# Patient Record
Sex: Male | Born: 1945 | Race: White | Hispanic: No | Marital: Married | Smoking: Former smoker
Health system: Southern US, Community
[De-identification: ages and names within clinical notes are randomized; demographics above are authoritative.]

## PROBLEM LIST (undated history)

## (undated) DIAGNOSIS — C801 Malignant (primary) neoplasm, unspecified: Secondary | ICD-10-CM

## (undated) DIAGNOSIS — Z87898 Personal history of other specified conditions: Secondary | ICD-10-CM

## (undated) DIAGNOSIS — N261 Atrophy of kidney (terminal): Secondary | ICD-10-CM

## (undated) DIAGNOSIS — N201 Calculus of ureter: Secondary | ICD-10-CM

## (undated) DIAGNOSIS — Z8679 Personal history of other diseases of the circulatory system: Secondary | ICD-10-CM

## (undated) DIAGNOSIS — Z85820 Personal history of malignant melanoma of skin: Secondary | ICD-10-CM

## (undated) DIAGNOSIS — I10 Essential (primary) hypertension: Secondary | ICD-10-CM

## (undated) DIAGNOSIS — Z973 Presence of spectacles and contact lenses: Secondary | ICD-10-CM

## (undated) DIAGNOSIS — R011 Cardiac murmur, unspecified: Secondary | ICD-10-CM

## (undated) DIAGNOSIS — N183 Chronic kidney disease, stage 3 unspecified: Secondary | ICD-10-CM

## (undated) DIAGNOSIS — M1990 Unspecified osteoarthritis, unspecified site: Secondary | ICD-10-CM

## (undated) DIAGNOSIS — Z87442 Personal history of urinary calculi: Secondary | ICD-10-CM

## (undated) DIAGNOSIS — Z972 Presence of dental prosthetic device (complete) (partial): Secondary | ICD-10-CM

## (undated) DIAGNOSIS — J302 Other seasonal allergic rhinitis: Secondary | ICD-10-CM

## (undated) DIAGNOSIS — N289 Disorder of kidney and ureter, unspecified: Secondary | ICD-10-CM

## (undated) DIAGNOSIS — K219 Gastro-esophageal reflux disease without esophagitis: Secondary | ICD-10-CM

## (undated) HISTORY — PX: UMBILICAL HERNIA REPAIR: SUR1181

## (undated) HISTORY — PX: OTHER SURGICAL HISTORY: SHX169

## (undated) HISTORY — PX: COLONOSCOPY: SHX174

## (undated) HISTORY — PX: MOHS SURGERY: SHX181

## (undated) MED FILL — Decitabine For Inj 50 MG: INTRAVENOUS | Qty: 7 | Status: AC

---

## 1976-05-08 HISTORY — PX: ORIF ANKLE FRACTURE: SHX5408

## 1984-05-08 HISTORY — PX: TONSILLECTOMY AND ADENOIDECTOMY: SUR1326

## 2005-05-08 HISTORY — PX: ANKLE FUSION: SHX881

## 2009-12-28 ENCOUNTER — Ambulatory Visit (HOSPITAL_BASED_OUTPATIENT_CLINIC_OR_DEPARTMENT_OTHER): Admission: RE | Admit: 2009-12-28 | Discharge: 2009-12-28 | Payer: Self-pay | Admitting: Orthopedic Surgery

## 2009-12-28 HISTORY — PX: CARPAL TUNNEL RELEASE: SHX101

## 2010-03-03 ENCOUNTER — Ambulatory Visit (HOSPITAL_COMMUNITY): Admission: RE | Admit: 2010-03-03 | Discharge: 2010-03-03 | Payer: Self-pay | Admitting: Urology

## 2010-03-07 ENCOUNTER — Ambulatory Visit (HOSPITAL_COMMUNITY): Admission: RE | Admit: 2010-03-07 | Discharge: 2010-03-07 | Payer: Self-pay | Admitting: Urology

## 2010-03-23 ENCOUNTER — Encounter: Admission: RE | Admit: 2010-03-23 | Discharge: 2010-03-23 | Payer: Self-pay | Admitting: Neurosurgery

## 2010-04-06 ENCOUNTER — Encounter: Admission: RE | Admit: 2010-04-06 | Discharge: 2010-04-06 | Payer: Self-pay | Admitting: Neurosurgery

## 2010-05-23 ENCOUNTER — Ambulatory Visit
Admission: RE | Admit: 2010-05-23 | Discharge: 2010-05-23 | Payer: Self-pay | Source: Home / Self Care | Attending: Urology | Admitting: Urology

## 2010-05-23 HISTORY — PX: OTHER SURGICAL HISTORY: SHX169

## 2010-05-25 LAB — POCT I-STAT 4, (NA,K, GLUC, HGB,HCT)
Glucose, Bld: 97 mg/dL (ref 70–99)
HCT: 37 % — ABNORMAL LOW (ref 39.0–52.0)
Hemoglobin: 12.6 g/dL — ABNORMAL LOW (ref 13.0–17.0)
Potassium: 4.5 mEq/L (ref 3.5–5.1)
Sodium: 139 mEq/L (ref 135–145)

## 2010-06-08 NOTE — Op Note (Signed)
NAME:  Richard Wallace, Richard Wallace NO.:  [id number]  MEDICAL RECORD NO.:  [id number]          PATIENT TYPE:  AMB  LOCATION:  [facility]                         FACILITY:  [facility]  PHYSICIAN:  Bertram Millard. Dawnyel Leven, M.D.DATE OF BIRTH:  1945/07/04  DATE OF PROCEDURE:  05/23/2010 DATE OF DISCHARGE:                              OPERATIVE REPORT   PREOPERATIVE DIAGNOSIS:  A 10-mm proximal left ureteral stone with significant hydronephrosis.  POSTOPERATIVE DIAGNOSES:  A 10-mm proximal left ureteral stone with significant hydronephrosis.  PRINCIPLE PROCEDURES:  Cystoscopy, left retrograde ureteral pyelogram, left ureteroscopy with holmium laser and extraction of ureteral stone, double-J stent placement (6-French x 24 cm contour).  SURGEON:  Bertram Millard. Sheneika Walstad, M.D.  ANESTHESIA:  General with LMA.  COMPLICATIONS:  None.  SPECIMENS:  Stones left in the bladder.  BRIEF HISTORY:  This 65 year old male recently presented to me for consideration of laparoscopic left nephrectomy.  The patient has a fairly nonfunctioning kidney with a 10-mm stone on that side.  He has been asymptomatic with the stone, however.  I saw him in consultation, and in reviewing the patient's data, felt it might be worthwhile just treating the stone and leaving what remaining kidney with function to be determined later instead of removing the patient's kidney.  Certainly, this was the less risky procedure to remove the stone then to remove the kidney.  Risks and complications of the ureteroscopy with stone extraction were discussed with the patient and his wife.  They understand these and desire to proceed.  DESCRIPTION OF PROCEDURE:  The patient was identified in the holding area, the surgical side was marked, and he received preoperative IV antibiotics.  He was taken to the operating room where general anesthetic was administered using LMA.  He was placed in the dorsal lithotomy position.  Genitalia and  perineum were prepped and draped. Time-out was then called.  The procedure commenced.  I dilated the patient's urethra to 24-French with Sissy Hoff sounds, and then passed a 22-French panendoscope directly through the urethra.  It was normal without evidence of prostatic obstruction or lesions.  The bladder was inspected.  The urine was drained and then the irrigation fluid instilled.  No tumors, trabeculations or foreign bodies were seen.  Both ureteral orifices were normal in configuration and location.  I used a 6-French end-hole open- end catheter to perform a retrograde.  This showed a normal distal ureter.  There was a filling defect in the proximal ureter with very little egress of contrast beyond.  There were no filling defects otherwise in the ureter besides this one stone.  I then, with a bit of difficulty, ended up passing an angle tipped Glidewire past the stone, utilizing the open-ended ureteral catheter to help me guide this by. Following this, I removed the open-end catheter and dilated the distal ureter and the ureteral orifice with the inner core of a ureteral access sheath.  Following this, I then placed a ureteroscope up to the stone. The stone was identified and pictures taken.  I then used a 200 micron fiber to fragment the stone into multiple small fragments.  The  stone fragmented fairly well.  I then grasped most of the fragments, all that I could see, and extracted them into the bladder using the nitinol basket.  Multiple fragments were rinsed into the bladder.  I felt that the patient could void these out at a later time.  After extracting perhaps 20 fragments, I passed the scope up to where the stone was.  No further stones were seen.  Some might have migrated into the renal pelvis, but I could not see any to extract.  At this point, I removed the ureteroscope and, over the top of the guidewire, placed a 6-French x 24 cm contour catheter.  The string was left on  the end.  Good proximal distal curls were seen after extraction of the wire.  The bladder was drained.  The scope was removed.  The patient received a B and O suppository.  The patient's string was taped to his penis.  The patient will follow up with me in approximately 2 weeks.  It was recommended that he remove the stent in three more days by pulling the string.  He was discharged home with hydrocodone, Keflex for 5 days and Uribel for dysuria.     Bertram Millard. Retta Diones, M.D.     SMD/MEDQ  D:  05/23/2010  T:  05/23/2010  Job:  [id number]  Electronically Signed by Marcine Matar M.D. on 06/08/2010 06:02:55 PM

## 2010-07-22 LAB — BASIC METABOLIC PANEL
BUN: 24 mg/dL — ABNORMAL HIGH (ref 6–23)
CO2: 27 mEq/L (ref 19–32)
Calcium: 9.4 mg/dL (ref 8.4–10.5)
Chloride: 106 mEq/L (ref 96–112)
Creatinine, Ser: 1.08 mg/dL (ref 0.4–1.5)
GFR calc Af Amer: >60 mL/min (ref >60)
GFR calc non Af Amer: >60 mL/min (ref >60)
Glucose, Bld: 109 mg/dL — ABNORMAL HIGH (ref 70–99)
Potassium: 5 mEq/L (ref 3.5–5.1)
Sodium: 138 mEq/L (ref 135–145)

## 2010-07-22 LAB — POCT HEMOGLOBIN-HEMACUE: Hemoglobin: 15.2 g/dL (ref 13.0–17.0)

## 2010-08-31 ENCOUNTER — Other Ambulatory Visit: Payer: Self-pay | Admitting: Urology

## 2010-08-31 DIAGNOSIS — N1330 Unspecified hydronephrosis: Secondary | ICD-10-CM

## 2010-10-07 ENCOUNTER — Encounter (HOSPITAL_COMMUNITY)
Admission: RE | Admit: 2010-10-07 | Discharge: 2010-10-07 | Disposition: A | Discharge status: Home / Self Care | Payer: Medicare Other | Source: Ambulatory Visit | Attending: Urology | Admitting: Urology

## 2010-10-07 DIAGNOSIS — N1330 Unspecified hydronephrosis: Secondary | ICD-10-CM | POA: Insufficient documentation

## 2010-10-07 DIAGNOSIS — N289 Disorder of kidney and ureter, unspecified: Secondary | ICD-10-CM | POA: Insufficient documentation

## 2010-10-07 MED ORDER — TECHNETIUM TC 99M MERTIATIDE
16.0000 | Freq: Once | INTRAVENOUS | Status: AC | PRN
  Administered 2010-10-07: 16 via INTRAVENOUS

## 2010-10-07 MED ORDER — FUROSEMIDE 10 MG/ML IJ SOLN: 47.0000 mg | Freq: Once | INTRAMUSCULAR | Status: DC

## 2010-11-21 ENCOUNTER — Ambulatory Visit (HOSPITAL_BASED_OUTPATIENT_CLINIC_OR_DEPARTMENT_OTHER)
Admission: RE | Admit: 2010-11-21 | Discharge: 2010-11-21 | Disposition: A | Discharge status: Home / Self Care | Payer: Medicare Other | Source: Ambulatory Visit | Attending: Urology | Admitting: Urology

## 2010-11-21 DIAGNOSIS — Z01812 Encounter for preprocedural laboratory examination: Secondary | ICD-10-CM | POA: Insufficient documentation

## 2010-11-21 DIAGNOSIS — N1330 Unspecified hydronephrosis: Secondary | ICD-10-CM | POA: Insufficient documentation

## 2010-11-21 DIAGNOSIS — N3289 Other specified disorders of bladder: Secondary | ICD-10-CM | POA: Insufficient documentation

## 2010-11-21 HISTORY — PX: OTHER SURGICAL HISTORY: SHX169

## 2010-11-21 LAB — POCT I-STAT 4, (NA,K, GLUC, HGB,HCT)
Glucose, Bld: 95 mg/dL (ref 70–99)
HCT: 46 % (ref 39.0–52.0)
Hemoglobin: 15.6 g/dL (ref 13.0–17.0)
Potassium: 4.6 mEq/L (ref 3.5–5.1)
Sodium: 139 mEq/L (ref 135–145)

## 2010-12-06 NOTE — Op Note (Signed)
NAME:  Richard Wallace, Richard Wallace NO.:  [id number]  MEDICAL RECORD NO.:  [id number]  LOCATION:                                 FACILITY:  PHYSICIAN:  Bertram Millard. Bentlie Catanzaro, M.D.DATE OF BIRTH:  1945-06-13  DATE OF PROCEDURE:  11/21/2010 DATE OF DISCHARGE:                              OPERATIVE REPORT   PREOPERATIVE DIAGNOSIS:  Left hydronephrosis with poor bladder emptying, rule out ureteral stricture.  POSTOPERATIVE DIAGNOSIS:  Left hydronephrosis, no evidence of ureteral stricture.  PRINCIPAL PROCEDURES:  Cystoscopy, left retrograde ureteral pyelogram.  SURGEON:  Bertram Millard. Tom Macpherson, MD  ANESTHESIA:  General with LMA.  COMPLICATIONS:  None.  BRIEF HISTORY:  This 65 year old gentleman underwent ureteroscopic stone treatment with holmium laser and extraction in April of this year.  He had, more than likely, a longstanding left ureteral stone which was minimally symptomatic, but imaging revealed significant hydronephrosis. He was referred to me by Dr. Brunilda Payor for possible laparoscopic nephrectomy. I thought it worthwhile to just treat the stone, which was done in April.  Recent Lasix renogram revealed poor flow into the kidney and very poor emptying out of that left kidney with splits of 93% function on the right, 7% on the left.  It was recommended that we perform cystoscopy, retrograde, possible endopyelotomy of the ureteral stricture versus ureteroscopic management of any residual stone.  He presents at this time for that procedure.  Risks and complications have been discussed with him.  He understands these and desires to proceed.  DESCRIPTION OF PROCEDURE:  The patient was identified in the holding area.  Surgical site was marked and he received preoperative IV antibiotics.  He was taken to the operating room where general anesthetic was administered using the LMA.  He was placed in the dorsal lithotomy position.  Genitalia and perineum were prepped and  draped. Time-out was then performed.  The procedure then commenced.  A 22-French panendoscope was advanced under direct vision through his urethra.  Urethra was normal, prostate was nonobstructive.  The bladder was entered.  Circumferential examination of the bladder revealed minimal trabeculations, normal ureteral orifices bilaterally.  No tumors or foreign bodies were noted. The left ureteral orifice was cannulated with a 6-French open-ended catheter.  Retrograde was then performed.  The retrograde revealed a totally normal ureter.  There was no evidence of any obstruction along the length of the ureter.  The renal pelvis was fairly small.  There was no evidence of proximal dilatation.  The calyces were somewhat widened but there was no evidence of significant hydronephrosis.  Spot films were then taken.  Adequate emptying was seen with continued fluoroscopy.  At this point, I did not feel that any ureteroscopy or endopyelotomy was needed, as there was no evidence of residual scar within the ureter. The bladder was drained and then scope removed.  The patient tolerated the procedure well.  He was awakened, extubated, and taken to PACU in stable condition.  I will send him home on 3 days of Cipro 250 mg 1 p.o. b.i.d..  This was sent to his pharmacy.  He has an appointment with me on December 16, 2010. However, if he is asymptomatic, he will  call to cancel that and they will return on a p.r.n. basis.     Bertram Millard. Hussain Maimone, M.D.     SMD/MEDQ  D:  11/21/2010  T:  11/21/2010  Job:  [id number]  cc:   Dr. Donney Rankins, [place]  Electronically Signed by Marcine Matar M.D. on 12/06/2010 04:57:57 PM

## 2012-11-19 ENCOUNTER — Ambulatory Visit (INDEPENDENT_AMBULATORY_CARE_PROVIDER_SITE_OTHER): Payer: Medicare Other | Admitting: Urology

## 2012-11-19 ENCOUNTER — Ambulatory Visit (HOSPITAL_COMMUNITY)
Admission: RE | Admit: 2012-11-19 | Discharge: 2012-11-19 | Disposition: A | Discharge status: Home / Self Care | Payer: Medicare Other | Source: Ambulatory Visit | Attending: Urology | Admitting: Urology

## 2012-11-19 ENCOUNTER — Other Ambulatory Visit: Payer: Self-pay | Admitting: Urology

## 2012-11-19 DIAGNOSIS — N200 Calculus of kidney: Secondary | ICD-10-CM

## 2013-07-16 ENCOUNTER — Other Ambulatory Visit: Payer: Self-pay | Admitting: Orthopedic Surgery

## 2013-07-16 ENCOUNTER — Encounter (HOSPITAL_BASED_OUTPATIENT_CLINIC_OR_DEPARTMENT_OTHER): Payer: Self-pay | Admitting: *Deleted

## 2013-07-16 NOTE — Progress Notes (Signed)
Called for ekg and ov notes=will need istat-was here 2011 for lt ctr

## 2013-07-21 NOTE — H&P (Signed)
  Richard Wallace is an 68 y.o. male.   Chief Complaint: c/o chronic and progressive numbness and tingling of the right hand HPI: Richard Wallace is a well known former patient who returns for evaluation of right carpal tunnel symptoms.  In 2011 we performed bilateral electrodiagnostic studies which documented left carpal tunnel syndrome.  Richard Wallace had surgery to release his left transverse carpal ligament and release of his left long finger A-1 pulley.  He now has some nodule formation in his right long finger superficialis tendon without active triggering and persistent right hand numbness.  Past Medical History  Diagnosis Date  . Hypertension   . GERD (gastroesophageal reflux disease)   . Arthritis   . 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 Seasonal allergies   . Wears glasses     Past Surgical History  Procedure Laterality Date  . Carpal tunnel release  2011    left with lt long tf  . Cystoscopy w/ ureteral stent placement  2012    left  . Cystoscopy w/ stone manipulation  2012  . Orif ankle fracture  1978    right  . Umbilical hernia repair    . Colonoscopy      History reviewed. No pertinent family history. Social History:  reports that he quit smoking about 27 years ago. He does not have any smokeless tobacco history on file. He reports that he drinks alcohol. He reports that he does not use illicit drugs.  Allergies: No Known Allergies  No prescriptions prior to admission    No results found for this or any previous visit (from the past 48 hour(s)).  No results found.   Pertinent items are noted in HPI.  Height 5\' 10"  (1.778 m), weight 95.255 kg (210 lb).  General appearance: alert Head: Normocephalic, without obvious abnormality Neck: supple, symmetrical, trachea midline Resp: clear to auscultation bilaterally Cardio: regular rate and rhythm GI: normal findings: bowel sounds normal Extremities: Dr. Zebedee Iba repeated electrodiagnostic studies of the  right hand and arm only.  Richard Wallace has persistent significant right carpal tunnel syndrome. There were no abnormal parameters for ulnar nerve conduction. On exam the patient has a positive Phalen's and Tinel's. He has FROM of his digits and wrist. Normal sweat pattern. Pulses: 2+ and symmetric Skin: normal Neurologic: Grossly normal    Assessment/Plan Impression:  Right CTS  Plan: TO the OR for right CTR.The procedure, risks,benefits and post-op course were discussed with the patient at length and they were in agreement with the plan.  DASNOIT,Iveth Heidemann J 07/21/2013, 4:25 PM  H&P documentation: 07/22/2013  -History and Physical Reviewed  -Patient has been re-examined  -No change in the plan of care  Cammie Sickle, MD

## 2013-07-22 ENCOUNTER — Ambulatory Visit (HOSPITAL_BASED_OUTPATIENT_CLINIC_OR_DEPARTMENT_OTHER)
Admission: RE | Admit: 2013-07-22 | Discharge: 2013-07-22 | Disposition: A | Discharge status: Home / Self Care | Payer: Medicare Other | Source: Ambulatory Visit | Attending: Orthopedic Surgery | Admitting: Orthopedic Surgery

## 2013-07-22 ENCOUNTER — Encounter (HOSPITAL_BASED_OUTPATIENT_CLINIC_OR_DEPARTMENT_OTHER): Payer: Medicare Other | Admitting: Anesthesiology

## 2013-07-22 ENCOUNTER — Encounter (HOSPITAL_BASED_OUTPATIENT_CLINIC_OR_DEPARTMENT_OTHER): Payer: Self-pay | Admitting: *Deleted

## 2013-07-22 ENCOUNTER — Encounter (HOSPITAL_BASED_OUTPATIENT_CLINIC_OR_DEPARTMENT_OTHER)
Admission: RE | Disposition: A | Discharge status: Home / Self Care | Payer: Self-pay | Source: Ambulatory Visit | Attending: Orthopedic Surgery

## 2013-07-22 ENCOUNTER — Ambulatory Visit (HOSPITAL_BASED_OUTPATIENT_CLINIC_OR_DEPARTMENT_OTHER): Payer: Medicare Other | Admitting: Anesthesiology

## 2013-07-22 DIAGNOSIS — M129 Arthropathy, unspecified: Secondary | ICD-10-CM | POA: Insufficient documentation

## 2013-07-22 DIAGNOSIS — Z87891 Personal history of nicotine dependence: Secondary | ICD-10-CM | POA: Insufficient documentation

## 2013-07-22 DIAGNOSIS — G5600 Carpal tunnel syndrome, unspecified upper limb: Secondary | ICD-10-CM | POA: Insufficient documentation

## 2013-07-22 DIAGNOSIS — N289 Disorder of kidney and ureter, unspecified: Secondary | ICD-10-CM | POA: Insufficient documentation

## 2013-07-22 DIAGNOSIS — K219 Gastro-esophageal reflux disease without esophagitis: Secondary | ICD-10-CM | POA: Insufficient documentation

## 2013-07-22 DIAGNOSIS — I10 Essential (primary) hypertension: Secondary | ICD-10-CM | POA: Insufficient documentation

## 2013-07-22 HISTORY — DX: Disorder of kidney and ureter, unspecified: N28.9

## 2013-07-22 HISTORY — DX: Presence of spectacles and contact lenses: Z97.3

## 2013-07-22 HISTORY — DX: Other seasonal allergic rhinitis: J30.2

## 2013-07-22 HISTORY — PX: CARPAL TUNNEL RELEASE: SHX101

## 2013-07-22 HISTORY — DX: Unspecified osteoarthritis, unspecified site: M19.90

## 2013-07-22 HISTORY — DX: Gastro-esophageal reflux disease without esophagitis: K21.9

## 2013-07-22 HISTORY — DX: Essential (primary) hypertension: I10

## 2013-07-22 LAB — POCT I-STAT, CHEM 8
BUN: 36 mg/dL — ABNORMAL HIGH (ref 6–23)
Calcium, Ion: 1.32 mmol/L — ABNORMAL HIGH (ref 1.13–1.30)
Chloride: 105 mEq/L (ref 96–112)
Creatinine, Ser: 1.3 mg/dL (ref 0.50–1.35)
Glucose, Bld: 101 mg/dL — ABNORMAL HIGH (ref 70–99)
HCT: 46 % (ref 39.0–52.0)
Hemoglobin: 15.6 g/dL (ref 13.0–17.0)
Potassium: 4.8 mEq/L (ref 3.7–5.3)
Sodium: 144 mEq/L (ref 137–147)
TCO2: 27 mmol/L (ref 0–100)

## 2013-07-22 SURGERY — CARPAL TUNNEL RELEASE
Anesthesia: General | Site: Wrist | Laterality: Right

## 2013-07-22 MED ORDER — HYDROCODONE-ACETAMINOPHEN 5-325 MG PO TABS: 1.0000 | ORAL_TABLET | Freq: Four times a day (QID) | ORAL | Status: DC | PRN

## 2013-07-22 MED ORDER — HYDROMORPHONE HCL PF 1 MG/ML IJ SOLN: 0.2500 mg | INTRAMUSCULAR | Status: DC | PRN

## 2013-07-22 MED ORDER — OXYCODONE HCL 5 MG PO TABS: 5.0000 mg | ORAL_TABLET | Freq: Once | ORAL | Status: DC | PRN

## 2013-07-22 MED ORDER — MIDAZOLAM HCL 2 MG/2ML IJ SOLN: 1.0000 mg | INTRAMUSCULAR | Status: DC | PRN

## 2013-07-22 MED ORDER — FENTANYL CITRATE 0.05 MG/ML IJ SOLN: 50.0000 ug | INTRAMUSCULAR | Status: DC | PRN

## 2013-07-22 MED ORDER — LACTATED RINGERS IV SOLN
INTRAVENOUS | Status: DC
  Administered 2013-07-22 (×2): via INTRAVENOUS

## 2013-07-22 MED ORDER — CHLORHEXIDINE GLUCONATE 4 % EX LIQD: 60.0000 mL | Freq: Once | CUTANEOUS | Status: DC

## 2013-07-22 MED ORDER — LIDOCAINE HCL (CARDIAC) 20 MG/ML IV SOLN
INTRAVENOUS | Status: DC | PRN
  Administered 2013-07-22: 50 mg via INTRAVENOUS

## 2013-07-22 MED ORDER — PROPOFOL 10 MG/ML IV EMUL
INTRAVENOUS | Status: AC
  Filled 2013-07-22: qty 50

## 2013-07-22 MED ORDER — FENTANYL CITRATE 0.05 MG/ML IJ SOLN
INTRAMUSCULAR | Status: DC | PRN
  Administered 2013-07-22 (×2): 50 ug via INTRAVENOUS

## 2013-07-22 MED ORDER — ONDANSETRON HCL 4 MG/2ML IJ SOLN: 4.0000 mg | Freq: Once | INTRAMUSCULAR | Status: DC | PRN

## 2013-07-22 MED ORDER — MIDAZOLAM HCL 5 MG/5ML IJ SOLN
INTRAMUSCULAR | Status: DC | PRN
  Administered 2013-07-22: 1 mg via INTRAVENOUS

## 2013-07-22 MED ORDER — OXYCODONE HCL 5 MG/5ML PO SOLN: 5.0000 mg | Freq: Once | ORAL | Status: DC | PRN

## 2013-07-22 MED ORDER — MIDAZOLAM HCL 2 MG/2ML IJ SOLN
INTRAMUSCULAR | Status: AC
  Filled 2013-07-22: qty 2

## 2013-07-22 MED ORDER — DEXAMETHASONE SODIUM PHOSPHATE 4 MG/ML IJ SOLN
INTRAMUSCULAR | Status: DC | PRN
  Administered 2013-07-22: 10 mg via INTRAVENOUS

## 2013-07-22 MED ORDER — FENTANYL CITRATE 0.05 MG/ML IJ SOLN
INTRAMUSCULAR | Status: AC
  Filled 2013-07-22: qty 2

## 2013-07-22 MED ORDER — ONDANSETRON HCL 4 MG/2ML IJ SOLN
INTRAMUSCULAR | Status: DC | PRN
  Administered 2013-07-22: 4 mg via INTRAVENOUS

## 2013-07-22 MED ORDER — PROPOFOL 10 MG/ML IV BOLUS
INTRAVENOUS | Status: DC | PRN
Start: 2013-07-22 — End: 2013-07-22
  Administered 2013-07-22: 170 mg via INTRAVENOUS

## 2013-07-22 MED ORDER — LIDOCAINE HCL 2 % IJ SOLN
INTRAMUSCULAR | Status: DC | PRN
  Administered 2013-07-22: 3 mL

## 2013-07-22 MED ORDER — CEFAZOLIN SODIUM-DEXTROSE 2-3 GM-% IV SOLR: 2.0000 g | INTRAVENOUS | Status: DC

## 2013-07-22 SURGICAL SUPPLY — 40 items
BANDAGE ADH SHEER 1  50/CT (GAUZE/BANDAGES/DRESSINGS) IMPLANT
BANDAGE ELASTIC 3 VELCRO ST LF (GAUZE/BANDAGES/DRESSINGS) IMPLANT
BLADE SURG 15 STRL LF DISP TIS (BLADE) ×1 IMPLANT
BLADE SURG 15 STRL SS (BLADE) ×1
BNDG COHESIVE 3X5 TAN STRL LF (GAUZE/BANDAGES/DRESSINGS) ×2 IMPLANT
BNDG ESMARK 4X9 LF (GAUZE/BANDAGES/DRESSINGS) IMPLANT
BRUSH SCRUB EZ PLAIN DRY (MISCELLANEOUS) ×2 IMPLANT
CORDS BIPOLAR (ELECTRODE) ×2 IMPLANT
COVER MAYO STAND STRL (DRAPES) ×2 IMPLANT
COVER TABLE BACK 60X90 (DRAPES) ×2 IMPLANT
CUFF TOURNIQUET SINGLE 18IN (TOURNIQUET CUFF) ×2 IMPLANT
DECANTER SPIKE VIAL GLASS SM (MISCELLANEOUS) IMPLANT
DRAPE EXTREMITY T 121X128X90 (DRAPE) ×2 IMPLANT
DRAPE SURG 17X23 STRL (DRAPES) ×2 IMPLANT
GLOVE BIOGEL M STRL SZ7.5 (GLOVE) IMPLANT
GLOVE BIOGEL PI IND STRL 7.0 (GLOVE) ×1 IMPLANT
GLOVE BIOGEL PI INDICATOR 7.0 (GLOVE) ×1
GLOVE ECLIPSE 6.5 STRL STRAW (GLOVE) ×2 IMPLANT
GLOVE ORTHO TXT STRL SZ7.5 (GLOVE) ×2 IMPLANT
GOWN STRL REUS W/ TWL LRG LVL3 (GOWN DISPOSABLE) ×1 IMPLANT
GOWN STRL REUS W/ TWL XL LVL3 (GOWN DISPOSABLE) ×1 IMPLANT
GOWN STRL REUS W/TWL LRG LVL3 (GOWN DISPOSABLE) ×1
GOWN STRL REUS W/TWL XL LVL3 (GOWN DISPOSABLE) ×1
NEEDLE 27GAX1X1/2 (NEEDLE) ×2 IMPLANT
PACK BASIN DAY SURGERY FS (CUSTOM PROCEDURE TRAY) ×2 IMPLANT
PAD CAST 3X4 CTTN HI CHSV (CAST SUPPLIES) ×1 IMPLANT
PADDING CAST ABS 4INX4YD NS (CAST SUPPLIES)
PADDING CAST ABS COTTON 4X4 ST (CAST SUPPLIES) IMPLANT
PADDING CAST COTTON 3X4 STRL (CAST SUPPLIES) ×1
SPLINT PLASTER CAST XFAST 3X15 (CAST SUPPLIES) ×5 IMPLANT
SPLINT PLASTER XTRA FASTSET 3X (CAST SUPPLIES) ×5
SPONGE GAUZE 4X4 12PLY (GAUZE/BANDAGES/DRESSINGS) ×2 IMPLANT
STOCKINETTE 4X48 STRL (DRAPES) ×2 IMPLANT
STRIP CLOSURE SKIN 1/2X4 (GAUZE/BANDAGES/DRESSINGS) ×2 IMPLANT
SUT PROLENE 3 0 PS 2 (SUTURE) ×2 IMPLANT
SYR 3ML 23GX1 SAFETY (SYRINGE) IMPLANT
SYR CONTROL 10ML LL (SYRINGE) IMPLANT
TOWEL OR 17X24 6PK STRL BLUE (TOWEL DISPOSABLE) ×2 IMPLANT
TRAY DSU PREP LF (CUSTOM PROCEDURE TRAY) ×2 IMPLANT
UNDERPAD 30X30 INCONTINENT (UNDERPADS AND DIAPERS) ×2 IMPLANT

## 2013-07-22 NOTE — Op Note (Signed)
[id number] 

## 2013-07-22 NOTE — Brief Op Note (Signed)
07/22/2013  9:54 AM  PATIENT:  [name]  68 y.o. male  PRE-OPERATIVE DIAGNOSIS:  RIGHT CARPAL TUNNEL SYNDROME  POST-OPERATIVE DIAGNOSIS:  RIGHT CARPAL TUNNEL SYNDROME  PROCEDURE:  Procedure(s): RIGHT CARPAL TUNNEL RELEASE (Right)  SURGEON:  Surgeon(s) and Role:    * Cammie Sickle., MD - Primary  PHYSICIAN ASSISTANT:   ASSISTANTS: surgical tech  ANESTHESIA:   general  EBL:  Total I/O In: 1000 [I.V.:1000] Out: -   BLOOD ADMINISTERED:none  DRAINS: none   LOCAL MEDICATIONS USED:  LIDOCAINE   SPECIMEN:  No Specimen  DISPOSITION OF SPECIMEN:  N/A  COUNTS:  YES  TOURNIQUET:   Total Tourniquet Time Documented: Upper Arm (Right) - 12 minutes Total: Upper Arm (Right) - 12 minutes   DICTATION: .Other Dictation: Dictation Number [id number]  PLAN OF CARE: Discharge to home after PACU  PATIENT DISPOSITION:  PACU - hemodynamically stable.   Delay start of Pharmacological VTE agent (>24hrs) due to surgical blood loss or risk of bleeding: not applicable

## 2013-07-22 NOTE — Anesthesia Postprocedure Evaluation (Signed)
  Anesthesia Post-op Note  Patient: Richard Wallace  Procedure(s) Performed: Procedure(s): RIGHT CARPAL TUNNEL RELEASE (Right)  Patient Location: PACU  Anesthesia Type:General  Level of Consciousness: awake, alert  and oriented  Airway and Oxygen Therapy: Patient Spontanous Breathing and Patient connected to nasal cannula oxygen  Post-op Pain: mild  Post-op Assessment: Post-op Vital signs reviewed, Patient's Cardiovascular Status Stable, Respiratory Function Stable, Patent Airway and Pain level controlled  Post-op Vital Signs: stable  Complications: No apparent anesthesia complications

## 2013-07-22 NOTE — Op Note (Signed)
NAME:  Richard Wallace, Richard Wallace NO.:  [id number]  MEDICAL RECORD NO.:  [id number]  LOCATION:                                 FACILITY:  PHYSICIAN:  Youlanda Mighty. Shiri Hodapp, M.D.      DATE OF BIRTH:  DATE OF PROCEDURE:  07/22/2013 DATE OF DISCHARGE:                              OPERATIVE REPORT   PREOPERATIVE DIAGNOSIS:  Severe right carpal tunnel syndrome.  POSTOPERATIVE DIAGNOSIS:  Severe right carpal tunnel syndrome.  OPERATION:  Release of right transverse carpal ligament.  OPERATING SURGEON:  Youlanda Mighty. Jaclene Bartelt, M.D.  ASSISTANT:  Surgical technician.  ANESTHESIA:  General by LMA.  SUPERVISING ANESTHESIOLOGIST:  Glynda Jaeger, M.D.  INDICATIONS:  Richard Wallace is a 68 year old gentleman who has had a previous left carpal tunnel release.  He returned requesting similar surgery on the right due to persistent numbness.  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  Due to a failure to respond to nonoperative measures, he was brought to the operating room at this time to release his right transverse carpal ligament.  Preoperatively, he was interviewed by Dr. Linna Caprice of Anesthesia.  General anesthesia by LMA technique was recommended and accepted.  Questions regarding anticipated procedure were invited and answered in the holding area.  His right hand was marked for proper surgical site per protocol with a marking pen.  DESCRIPTION OF PROCEDURE:  Richard Wallace was brought to room #2 of the [facility] and placed in supine position on the operating table.  Following induction of general anesthesia by LMA technique under Dr. Verneda Skill direct supervision, the right hand and arm were prepped with Betadine soap and solution, and sterilely draped.  A pneumatic tourniquet was applied to the proximal right brachium.  Following exsanguination of the right arm with Esmarch bandage,  the arterial tourniquet was inflated to 220 mmHg.  Following routine surgical time-out, procedure commenced with a short incision in the line of the ring finger in the palm.  Subcutaneous tissues were carefully divided revealing the palmar fascia.  This split longitudinally to reveal the common sensory branch of the median nerve and the superficial palmar arch.  The distal margin of the transcarpal ligament was isolated with Penfield 4 elevator and a path created deep to the transverse carpal ligament to allow safe release.  The transverse carpal ligament was released along its ulnar border extending into the distal forearm.  This widely opened the carpal canal. No masses or other predicaments were encountered in the ulnar bursa.  The tenosynovium was moderately fibrotic.  Bleeding points along the margin of those released ligament were electrocauterized with bipolar current followed by repair of the skin with intradermal 3-0 Prolene suture.  Lidocaine 2% was infiltrated for postoperative analgesia, total volume of 4 mL.  The wound was dressed with Steri-Strips, sterile gauze, sterile Webril, and the volar plaster splint maintaining the wrist in 15 degrees of dorsiflexion.  For aftercare, Richard Wallace was provided prescription for hydrocodone 5 mg 1 p.o. q.4-6 hours p.r.n. pain, 20 tablets without refill.     Youlanda Mighty Janari Yamada, M.D.     RVS/MEDQ  D:  07/22/2013  T:  07/22/2013  Job:  [id number]

## 2013-07-22 NOTE — Discharge Instructions (Addendum)
°  Post Anesthesia Home Care Instructions ° °Activity: °Get plenty of rest for the remainder of the day. A responsible adult should stay with you for 24 hours following the procedure.  °For the next 24 hours, DO NOT: °-Drive a car °-Operate machinery °-Drink alcoholic beverages °-Take any medication unless instructed by your physician °-Make any legal decisions or sign important papers. ° °Meals: °Start with liquid foods such as gelatin or soup. Progress to regular foods as tolerated. Avoid greasy, spicy, heavy foods. If nausea and/or vomiting occur, drink only clear liquids until the nausea and/or vomiting subsides. Call your physician if vomiting continues. ° °Special Instructions/Symptoms: °Your throat may feel dry or sore from the anesthesia or the breathing tube placed in your throat during surgery. If this causes discomfort, gargle with warm salt water. The discomfort should disappear within 24 hours. ° ° ° ° ° °[facility] Instructions °Hand Surgery ° °Wound Care: °Keep your hand elevated above the level of your heart.  Do not allow it to dangle by your side.  Keep the dressing dry and do not remove it unless your doctor advises you to do so.  He will usually change it at the time of your post-op visit.  Moving your fingers is advised to stimulate circulation but will depend on the site of your surgery.  If you have a splint applied, your doctor will advise you regarding movement. ° °Activity: °Do not drive or operate machinery today.  Rest today and then you may return to your normal activity and work as indicated by your physician. ° °Diet:  °Drink liquids today or eat a light diet.  You may resume a regular diet tomorrow.   ° °General expectations: °Pain for two to three days. °Fingers may become slightly swollen. ° °Call your doctor if any of the following occur: °Severe pain not relieved by pain medication. °Elevated temperature. °Dressing soaked with blood. °Inability to move fingers. °White or bluish  color to fingers. °

## 2013-07-22 NOTE — Transfer of Care (Signed)
Immediate Anesthesia Transfer of Care Note  Patient: Richard Wallace  Procedure(s) Performed: Procedure(s): RIGHT CARPAL TUNNEL RELEASE (Right)  Patient Location: PACU  Anesthesia Type:General  Level of Consciousness: awake, oriented and patient cooperative  Airway & Oxygen Therapy: Patient Spontanous Breathing and Patient connected to face mask oxygen  Post-op Assessment: Report given to PACU RN and Post -op Vital signs reviewed and stable  Post vital signs: Reviewed and stable  Complications: No apparent anesthesia complications

## 2013-07-22 NOTE — Anesthesia Procedure Notes (Signed)
Procedure Name: LMA Insertion Date/Time: 07/22/2013 9:23 AM Performed by: Lyndee Leo Pre-anesthesia Checklist: Patient identified, Emergency Drugs available, Suction available and Patient being monitored Patient Re-evaluated:Patient Re-evaluated prior to inductionOxygen Delivery Method: Circle System Utilized Preoxygenation: Pre-oxygenation with 100% oxygen Intubation Type: IV induction Ventilation: Mask ventilation without difficulty LMA: LMA inserted LMA Size: 5.0 Number of attempts: 1 Airway Equipment and Method: bite block Placement Confirmation: positive ETCO2 Tube secured with: Tape Dental Injury: Teeth and Oropharynx as per pre-operative assessment

## 2013-07-22 NOTE — Anesthesia Preprocedure Evaluation (Signed)
Anesthesia Evaluation  Patient identified by MRN, date of birth, ID band Patient awake    Reviewed: Allergy & Precautions, H&P , NPO status , Patient's Chart, lab work & pertinent test results  Airway Mallampati: II      Dental  (+) Teeth Intact, Dental Advisory Given   Pulmonary former smoker,  breath sounds clear to auscultation        Cardiovascular hypertension, Rhythm:Regular     Neuro/Psych    GI/Hepatic   Endo/Other    Renal/GU      Musculoskeletal   Abdominal   Peds  Hematology   Anesthesia Other Findings   Reproductive/Obstetrics                           Anesthesia Physical Anesthesia Plan  ASA: II  Anesthesia Plan: General   Post-op Pain Management:    Induction: Intravenous  Airway Management Planned: LMA  Additional Equipment:   Intra-op Plan:   Post-operative Plan:   Informed Consent: I have reviewed the patients History and Physical, chart, labs and discussed the procedure including the risks, benefits and alternatives for the proposed anesthesia with the patient or authorized representative who has indicated his/her understanding and acceptance.   Dental advisory given  Plan Discussed with: CRNA and Anesthesiologist  Anesthesia Plan Comments:         Anesthesia Quick Evaluation

## 2013-07-23 ENCOUNTER — Encounter (HOSPITAL_BASED_OUTPATIENT_CLINIC_OR_DEPARTMENT_OTHER): Payer: Self-pay | Admitting: Orthopedic Surgery

## 2014-01-28 DIAGNOSIS — C801 Malignant (primary) neoplasm, unspecified: Secondary | ICD-10-CM

## 2014-01-28 HISTORY — DX: Malignant (primary) neoplasm, unspecified: C80.1

## 2014-07-15 ENCOUNTER — Other Ambulatory Visit (HOSPITAL_COMMUNITY): Payer: Self-pay | Admitting: Neurosurgery

## 2014-08-11 ENCOUNTER — Encounter (HOSPITAL_COMMUNITY)
Admission: RE | Admit: 2014-08-11 | Discharge: 2014-08-11 | Disposition: A | Discharge status: Home / Self Care | Payer: Medicare Other | Source: Ambulatory Visit | Attending: Neurosurgery | Admitting: Neurosurgery

## 2014-08-11 ENCOUNTER — Encounter (HOSPITAL_COMMUNITY): Payer: Self-pay

## 2014-08-11 DIAGNOSIS — Z01810 Encounter for preprocedural cardiovascular examination: Secondary | ICD-10-CM | POA: Diagnosis not present

## 2014-08-11 DIAGNOSIS — Z01812 Encounter for preprocedural laboratory examination: Secondary | ICD-10-CM | POA: Insufficient documentation

## 2014-08-11 HISTORY — DX: Cardiac murmur, unspecified: R01.1

## 2014-08-11 HISTORY — DX: Malignant (primary) neoplasm, unspecified: C80.1

## 2014-08-11 LAB — CBC
HCT: 46.6 % (ref 39.0–52.0)
Hemoglobin: 15.5 g/dL (ref 13.0–17.0)
MCH: 29.6 pg (ref 26.0–34.0)
MCHC: 33.3 g/dL (ref 30.0–36.0)
MCV: 89.1 fL (ref 78.0–100.0)
Platelets: 247 10*3/uL (ref 150–400)
RBC: 5.23 MIL/uL (ref 4.22–5.81)
RDW: 14.2 % (ref 11.5–15.5)
WBC: 8.7 10*3/uL (ref 4.0–10.5)

## 2014-08-11 LAB — SURGICAL PCR SCREEN
MRSA, PCR: NEGATIVE
Staphylococcus aureus: NEGATIVE

## 2014-08-11 LAB — TYPE AND SCREEN
ABO/RH(D): O POS
Antibody Screen: NEGATIVE

## 2014-08-11 LAB — BASIC METABOLIC PANEL
Anion gap: 7 (ref 5–15)
BUN: 37 mg/dL — ABNORMAL HIGH (ref 6–23)
CO2: 27 mmol/L (ref 19–32)
Calcium: 10.1 mg/dL (ref 8.4–10.5)
Chloride: 104 mmol/L (ref 96–112)
Creatinine, Ser: 1.57 mg/dL — ABNORMAL HIGH (ref 0.50–1.35)
GFR calc Af Amer: 50 mL/min — ABNORMAL LOW (ref >90)
GFR calc non Af Amer: 43 mL/min — ABNORMAL LOW (ref >90)
Glucose, Bld: 92 mg/dL (ref 70–99)
Potassium: 4.7 mmol/L (ref 3.5–5.1)
Sodium: 138 mmol/L (ref 135–145)

## 2014-08-11 LAB — ABO/RH: ABO/RH(D): O POS

## 2014-08-11 NOTE — Pre-Procedure Instructions (Signed)
Degan Hanser  08/11/2014   Your procedure is scheduled on: April 15th   Report to [facility] at [phone].  Call this number if you have problems the morning of surgery: [phone]   Remember:   Do not eat food or drink liquids after midnight.   Take these medicines the morning of surgery with A SIP OF WATER: allopurinol (Zyloprim), cetirizine(Zyrtec), hydrocodone(Norco/Vicodin) if needed, Prevacid   Stop taking aspirin, Aleve, Ibuprofen, BC's, Goody's, Herbal medications, Fish Oil    Do not wear jewelry, make-up or nail polish.  Do not wear lotions, powders, or perfumes. You may wear deodorant.  Do not shave 48 hours prior to surgery. Men may shave face and neck.  Do not bring valuables to the hospital.  [facility] is not responsible for any belongings or valuables.               Contacts, dentures or bridgework may not be worn into surgery.  Leave suitcase in the car. After surgery it may be brought to your room.  For patients admitted to the hospital, discharge time is determined by your  treatment team.               Patients discharged the day of surgery will not be allowed to drive home.    Special Instructions:[facility] - Preparing for Surgery  Before surgery, you can play an important role.  Because skin is not sterile, your skin needs to be as free of germs as possible.  You can reduce the number of germs on you skin by washing with CHG (chlorahexidine gluconate) soap before surgery.  CHG is an antiseptic cleaner which kills germs and bonds with the skin to continue killing germs even after washing.  Please DO NOT use if you have an allergy to CHG or antibacterial soaps.  If your skin becomes reddened/irritated stop using the CHG and inform your nurse when you arrive at Short Stay.  Do not shave (including legs and underarms) for at least 48 hours prior to the first CHG shower.  You may shave your face.  Please follow these instructions  carefully:   1.  Shower with CHG Soap the night before surgery and the   morning of Surgery.  2.  If you choose to wash your hair, wash your hair first as usual with your  normal shampoo.  3.  After you shampoo, rinse your hair and body thoroughly to remove the  Shampoo.  4.  Use CHG as you would any other liquid soap.  You can apply chg directly  to the skin and wash gently with scrungie or a clean washcloth.  5.  Apply the CHG Soap to your body ONLY FROM THE NECK DOWN.   Do not use on open wounds or open sores.  Avoid contact with your eyes,  ears, mouth and genitals (private parts).  Wash genitals (private parts)  with your normal soap.  6.  Wash thoroughly, paying special attention to the area where your surgery  will be performed.  7.  Thoroughly rinse your body with warm water from the neck down.  8.  DO NOT shower/wash with your normal soap after using and rinsing off  the CHG Soap.  9.  Pat yourself dry with a clean towel.            10.  Wear clean pajamas.            11.  Place clean sheets on your  bed the night of your first shower and do not  sleep with pets.  Day of Surgery  Do not apply any lotions/deoderants the morning of surgery.  Please wear clean clothes to the hospital/surgery center.      Please read over the following fact sheets that you were given: Pain Booklet, Coughing and Deep Breathing, Blood Transfusion Information, MRSA Information and Surgical Site Infection Prevention

## 2014-08-11 NOTE — Progress Notes (Signed)
PCP is Dr. Ralph Leyden Denies seeing a cardiologist. Denies having a card cath, stress test, or echo. Denies having a recent CXR or EKG.

## 2014-08-13 NOTE — Progress Notes (Addendum)
Anesthesia Chart Review:  Pt is 69 year old male scheduled for L2-3, L3-4, L4-5 PLIF on 08/21/2014 with Dr. Christella Noa.   PCP is [facility] in [place], [place].   PMH includes: HTN, heart murmur (unspecified). Former smoker. BMI 33. S/p R carpal tunnel release 07/22/2013.  Preoperative labs reviewed.  Cr 1.57, BUN 37.   EKG: Sinus rhythm with occasional PVCs. Inferior infarct, age undetermined. Appears stable when compared to tracing from 2011.   Willeen Cass, FNP-BC [facility]/Anesthesiology Phone: [phone] 08/13/2014 5:12 PM

## 2014-08-20 MED ORDER — CEFAZOLIN SODIUM-DEXTROSE 2-3 GM-% IV SOLR
2.0000 g | INTRAVENOUS | Status: AC
  Administered 2014-08-21 (×2): 2 g via INTRAVENOUS
  Filled 2014-08-20: qty 50

## 2014-08-21 ENCOUNTER — Inpatient Hospital Stay (HOSPITAL_COMMUNITY): Payer: Medicare Other

## 2014-08-21 ENCOUNTER — Encounter (HOSPITAL_COMMUNITY): Payer: Self-pay | Admitting: *Deleted

## 2014-08-21 ENCOUNTER — Inpatient Hospital Stay (HOSPITAL_COMMUNITY): Payer: Medicare Other | Admitting: Anesthesiology

## 2014-08-21 ENCOUNTER — Encounter (HOSPITAL_COMMUNITY)
Admission: RE | Disposition: A | Discharge status: Home Health | Payer: Medicare Other | Source: Ambulatory Visit | Attending: Neurosurgery

## 2014-08-21 ENCOUNTER — Inpatient Hospital Stay (HOSPITAL_COMMUNITY): Payer: Medicare Other | Admitting: Emergency Medicine

## 2014-08-21 ENCOUNTER — Inpatient Hospital Stay (HOSPITAL_COMMUNITY)
Admission: RE | Admit: 2014-08-21 | Discharge: 2014-08-30 | DRG: 457 | Disposition: A | Discharge status: Home Health | Payer: Medicare Other | Source: Ambulatory Visit | Attending: Neurosurgery | Admitting: Neurosurgery

## 2014-08-21 DIAGNOSIS — I952 Hypotension due to drugs: Secondary | ICD-10-CM | POA: Diagnosis not present

## 2014-08-21 DIAGNOSIS — M109 Gout, unspecified: Secondary | ICD-10-CM | POA: Diagnosis present

## 2014-08-21 DIAGNOSIS — M545 Low back pain: Secondary | ICD-10-CM | POA: Diagnosis present

## 2014-08-21 DIAGNOSIS — Z79899 Other long term (current) drug therapy: Secondary | ICD-10-CM | POA: Diagnosis not present

## 2014-08-21 DIAGNOSIS — M7138 Other bursal cyst, other site: Secondary | ICD-10-CM | POA: Diagnosis present

## 2014-08-21 DIAGNOSIS — M25561 Pain in right knee: Secondary | ICD-10-CM | POA: Diagnosis not present

## 2014-08-21 DIAGNOSIS — I129 Hypertensive chronic kidney disease with stage 1 through stage 4 chronic kidney disease, or unspecified chronic kidney disease: Secondary | ICD-10-CM | POA: Diagnosis not present

## 2014-08-21 DIAGNOSIS — J9811 Atelectasis: Secondary | ICD-10-CM | POA: Insufficient documentation

## 2014-08-21 DIAGNOSIS — M4310 Spondylolisthesis, site unspecified: Secondary | ICD-10-CM | POA: Diagnosis present

## 2014-08-21 DIAGNOSIS — N390 Urinary tract infection, site not specified: Secondary | ICD-10-CM | POA: Diagnosis not present

## 2014-08-21 DIAGNOSIS — M5126 Other intervertebral disc displacement, lumbar region: Secondary | ICD-10-CM | POA: Diagnosis present

## 2014-08-21 DIAGNOSIS — Z419 Encounter for procedure for purposes other than remedying health state, unspecified: Secondary | ICD-10-CM

## 2014-08-21 DIAGNOSIS — K219 Gastro-esophageal reflux disease without esophagitis: Secondary | ICD-10-CM | POA: Diagnosis present

## 2014-08-21 DIAGNOSIS — M419 Scoliosis, unspecified: Secondary | ICD-10-CM | POA: Diagnosis not present

## 2014-08-21 DIAGNOSIS — T41295A Adverse effect of other general anesthetics, initial encounter: Secondary | ICD-10-CM | POA: Diagnosis not present

## 2014-08-21 DIAGNOSIS — R52 Pain, unspecified: Secondary | ICD-10-CM

## 2014-08-21 DIAGNOSIS — K649 Unspecified hemorrhoids: Secondary | ICD-10-CM | POA: Diagnosis present

## 2014-08-21 DIAGNOSIS — Z85820 Personal history of malignant melanoma of skin: Secondary | ICD-10-CM

## 2014-08-21 DIAGNOSIS — R0902 Hypoxemia: Secondary | ICD-10-CM | POA: Diagnosis present

## 2014-08-21 DIAGNOSIS — E876 Hypokalemia: Secondary | ICD-10-CM | POA: Diagnosis not present

## 2014-08-21 DIAGNOSIS — N189 Chronic kidney disease, unspecified: Secondary | ICD-10-CM | POA: Diagnosis present

## 2014-08-21 DIAGNOSIS — K5900 Constipation, unspecified: Secondary | ICD-10-CM | POA: Diagnosis not present

## 2014-08-21 DIAGNOSIS — I10 Essential (primary) hypertension: Secondary | ICD-10-CM | POA: Insufficient documentation

## 2014-08-21 DIAGNOSIS — M4806 Spinal stenosis, lumbar region: Secondary | ICD-10-CM | POA: Diagnosis present

## 2014-08-21 DIAGNOSIS — Z981 Arthrodesis status: Secondary | ICD-10-CM

## 2014-08-21 DIAGNOSIS — Z7982 Long term (current) use of aspirin: Secondary | ICD-10-CM

## 2014-08-21 DIAGNOSIS — M5136 Other intervertebral disc degeneration, lumbar region: Secondary | ICD-10-CM | POA: Diagnosis present

## 2014-08-21 DIAGNOSIS — Z978 Presence of other specified devices: Secondary | ICD-10-CM

## 2014-08-21 DIAGNOSIS — M4126 Other idiopathic scoliosis, lumbar region: Secondary | ICD-10-CM

## 2014-08-21 HISTORY — PX: POSTERIOR LUMBAR FUSION: SHX6036

## 2014-08-21 LAB — BLOOD GAS, ARTERIAL
Acid-base deficit: 3.2 mmol/L — ABNORMAL HIGH (ref 0.0–2.0)
Bicarbonate: 21.2 mEq/L (ref 20.0–24.0)
Drawn by: 418751
FIO2: 0.5 %
Mode: POSITIVE
O2 Saturation: 97.1 %
PEEP: 5 cmH2O
Patient temperature: 98.6
Pressure support: 5 cmH2O
TCO2: 22.4 mmol/L (ref 0–100)
pCO2 arterial: 37.7 mmHg (ref 35.0–45.0)
pH, Arterial: 7.369 (ref 7.350–7.450)
pO2, Arterial: 97.6 mmHg (ref 80.0–100.0)

## 2014-08-21 LAB — MRSA PCR SCREENING: MRSA by PCR: NEGATIVE

## 2014-08-21 SURGERY — POSTERIOR LUMBAR FUSION 3 LEVEL
Anesthesia: General

## 2014-08-21 MED ORDER — POLYETHYL GLYCOL-PROPYL GLYCOL 0.4-0.3 % OP SOLN: 1.0000 [drp] | OPHTHALMIC | Status: DC

## 2014-08-21 MED ORDER — SUCCINYLCHOLINE CHLORIDE 20 MG/ML IJ SOLN
INTRAMUSCULAR | Status: AC
  Filled 2014-08-21: qty 1

## 2014-08-21 MED ORDER — LACTATED RINGERS IV SOLN
INTRAVENOUS | Status: DC | PRN
  Administered 2014-08-21 (×4): via INTRAVENOUS

## 2014-08-21 MED ORDER — PHENYLEPHRINE 40 MCG/ML (10ML) SYRINGE FOR IV PUSH (FOR BLOOD PRESSURE SUPPORT)
PREFILLED_SYRINGE | INTRAVENOUS | Status: AC
  Filled 2014-08-21: qty 10

## 2014-08-21 MED ORDER — ARTIFICIAL TEARS OP OINT
TOPICAL_OINTMENT | OPHTHALMIC | Status: DC | PRN
  Administered 2014-08-21: 1 via OPHTHALMIC

## 2014-08-21 MED ORDER — SODIUM CHLORIDE 0.9 % IJ SOLN
3.0000 mL | INTRAMUSCULAR | Status: DC | PRN
  Administered 2014-08-26 (×2): 3 mL via INTRAVENOUS
  Filled 2014-08-21 (×2): qty 3

## 2014-08-21 MED ORDER — MAGNESIUM CITRATE PO SOLN
1.0000 | Freq: Once | ORAL | Status: AC | PRN
  Filled 2014-08-21: qty 296

## 2014-08-21 MED ORDER — PROPOFOL 10 MG/ML IV BOLUS
INTRAVENOUS | Status: AC
  Filled 2014-08-21: qty 20

## 2014-08-21 MED ORDER — ARTIFICIAL TEARS OP OINT
TOPICAL_OINTMENT | OPHTHALMIC | Status: AC
  Filled 2014-08-21: qty 3.5

## 2014-08-21 MED ORDER — LISINOPRIL-HYDROCHLOROTHIAZIDE 10-12.5 MG PO TABS: 1.0000 | ORAL_TABLET | Freq: Every day | ORAL | Status: DC

## 2014-08-21 MED ORDER — CHLORHEXIDINE GLUCONATE 0.12 % MT SOLN
15.0000 mL | Freq: Two times a day (BID) | OROMUCOSAL | Status: DC
  Administered 2014-08-21: 15 mL via OROMUCOSAL
  Filled 2014-08-21: qty 15

## 2014-08-21 MED ORDER — DOCUSATE SODIUM 100 MG PO CAPS
100.0000 mg | ORAL_CAPSULE | Freq: Two times a day (BID) | ORAL | Status: DC
  Administered 2014-08-22 – 2014-08-30 (×18): 100 mg via ORAL
  Filled 2014-08-21 (×19): qty 1

## 2014-08-21 MED ORDER — GLYCOPYRROLATE 0.2 MG/ML IJ SOLN
INTRAMUSCULAR | Status: AC
  Filled 2014-08-21: qty 3

## 2014-08-21 MED ORDER — THROMBIN 20000 UNITS EX SOLR
CUTANEOUS | Status: DC | PRN
  Administered 2014-08-21: 10:00:00 via TOPICAL

## 2014-08-21 MED ORDER — SENNOSIDES-DOCUSATE SODIUM 8.6-50 MG PO TABS
1.0000 | ORAL_TABLET | Freq: Every evening | ORAL | Status: DC | PRN
  Filled 2014-08-21: qty 1

## 2014-08-21 MED ORDER — SODIUM CHLORIDE 0.9 % IJ SOLN
3.0000 mL | Freq: Two times a day (BID) | INTRAMUSCULAR | Status: DC
  Administered 2014-08-21 – 2014-08-27 (×10): 3 mL via INTRAVENOUS

## 2014-08-21 MED ORDER — DEXAMETHASONE SODIUM PHOSPHATE 10 MG/ML IJ SOLN
INTRAMUSCULAR | Status: AC
  Filled 2014-08-21: qty 1

## 2014-08-21 MED ORDER — BUPIVACAINE HCL (PF) 0.5 % IJ SOLN
INTRAMUSCULAR | Status: DC | PRN
  Administered 2014-08-21: 30 mL

## 2014-08-21 MED ORDER — MENTHOL 3 MG MT LOZG: 1.0000 | LOZENGE | OROMUCOSAL | Status: DC | PRN

## 2014-08-21 MED ORDER — PHENYLEPHRINE HCL 10 MG/ML IJ SOLN
INTRAMUSCULAR | Status: AC
  Filled 2014-08-21: qty 2

## 2014-08-21 MED ORDER — LIDOCAINE HCL (CARDIAC) 20 MG/ML IV SOLN
INTRAVENOUS | Status: AC
  Filled 2014-08-21: qty 5

## 2014-08-21 MED ORDER — HYDROCHLOROTHIAZIDE 12.5 MG PO CAPS
12.5000 mg | ORAL_CAPSULE | Freq: Every day | ORAL | Status: DC
  Administered 2014-08-22 – 2014-08-29 (×7): 12.5 mg via ORAL
  Filled 2014-08-21 (×8): qty 1

## 2014-08-21 MED ORDER — PROPOFOL 1000 MG/100ML IV EMUL
5.0000 ug/kg/min | INTRAVENOUS | Status: DC
  Administered 2014-08-21: 22.5 ug/kg/min via INTRAVENOUS

## 2014-08-21 MED ORDER — ONDANSETRON HCL 4 MG/2ML IJ SOLN: 4.0000 mg | INTRAMUSCULAR | Status: DC | PRN

## 2014-08-21 MED ORDER — MIDAZOLAM HCL 2 MG/2ML IJ SOLN
INTRAMUSCULAR | Status: AC
  Filled 2014-08-21: qty 2

## 2014-08-21 MED ORDER — ATROPINE SULFATE 1 MG/ML IJ SOLN
INTRAMUSCULAR | Status: DC | PRN
  Administered 2014-08-21 (×2): .1 mg via INTRAVENOUS
  Administered 2014-08-21: 0.2 mg via INTRAVENOUS
  Administered 2014-08-21: .1 mg via INTRAVENOUS

## 2014-08-21 MED ORDER — SODIUM CHLORIDE 0.9 % IV SOLN: 250.0000 mL | INTRAVENOUS | Status: DC

## 2014-08-21 MED ORDER — VECURONIUM BROMIDE 10 MG IV SOLR
INTRAVENOUS | Status: DC | PRN
  Administered 2014-08-21: 2 mg via INTRAVENOUS
  Administered 2014-08-21: 1 mg via INTRAVENOUS

## 2014-08-21 MED ORDER — ALLOPURINOL 100 MG PO TABS
300.0000 mg | ORAL_TABLET | Freq: Every day | ORAL | Status: DC
  Administered 2014-08-22 – 2014-08-30 (×9): 300 mg via ORAL
  Filled 2014-08-21 (×2): qty 1
  Filled 2014-08-21 (×7): qty 3

## 2014-08-21 MED ORDER — PHENYLEPHRINE HCL 10 MG/ML IJ SOLN
INTRAMUSCULAR | Status: AC
  Filled 2014-08-21: qty 1

## 2014-08-21 MED ORDER — ADULT MULTIVITAMIN W/MINERALS CH
1.0000 | ORAL_TABLET | Freq: Every day | ORAL | Status: DC
  Administered 2014-08-22 – 2014-08-30 (×9): 1 via ORAL
  Filled 2014-08-21 (×9): qty 1

## 2014-08-21 MED ORDER — ONDANSETRON HCL 4 MG/2ML IJ SOLN
INTRAMUSCULAR | Status: DC | PRN
  Administered 2014-08-21: 4 mg via INTRAVENOUS

## 2014-08-21 MED ORDER — HYDROCODONE-ACETAMINOPHEN 5-325 MG PO TABS
1.0000 | ORAL_TABLET | ORAL | Status: DC | PRN
  Administered 2014-08-23 – 2014-08-30 (×18): 1 via ORAL
  Filled 2014-08-21 (×18): qty 1

## 2014-08-21 MED ORDER — OXYCODONE-ACETAMINOPHEN 5-325 MG PO TABS
1.0000 | ORAL_TABLET | ORAL | Status: DC | PRN
  Administered 2014-08-22 – 2014-08-24 (×7): 2 via ORAL
  Administered 2014-08-24: 1 via ORAL
  Administered 2014-08-25: 2 via ORAL
  Administered 2014-08-25 – 2014-08-29 (×4): 1 via ORAL
  Filled 2014-08-21: qty 1
  Filled 2014-08-21 (×2): qty 2
  Filled 2014-08-21 (×2): qty 1
  Filled 2014-08-21 (×3): qty 2
  Filled 2014-08-21: qty 1
  Filled 2014-08-21 (×5): qty 2

## 2014-08-21 MED ORDER — SODIUM CHLORIDE 0.9 % IV SOLN
10.0000 mg | INTRAVENOUS | Status: DC | PRN
  Administered 2014-08-21 (×2): 20 ug/min via INTRAVENOUS

## 2014-08-21 MED ORDER — NEOSTIGMINE METHYLSULFATE 10 MG/10ML IV SOLN
INTRAVENOUS | Status: DC | PRN
  Administered 2014-08-21: 4 mg via INTRAVENOUS

## 2014-08-21 MED ORDER — DEXAMETHASONE SODIUM PHOSPHATE 10 MG/ML IJ SOLN
INTRAMUSCULAR | Status: DC | PRN
  Administered 2014-08-21: 10 mg via INTRAVENOUS

## 2014-08-21 MED ORDER — FENTANYL CITRATE (PF) 100 MCG/2ML IJ SOLN
INTRAMUSCULAR | Status: DC | PRN
  Administered 2014-08-21 (×3): 50 ug via INTRAVENOUS
  Administered 2014-08-21: 25 ug via INTRAVENOUS
  Administered 2014-08-21: 50 ug via INTRAVENOUS
  Administered 2014-08-21: 25 ug via INTRAVENOUS
  Administered 2014-08-21 (×5): 50 ug via INTRAVENOUS

## 2014-08-21 MED ORDER — ALBUMIN HUMAN 5 % IV SOLN
INTRAVENOUS | Status: DC | PRN
  Administered 2014-08-21 (×2): via INTRAVENOUS

## 2014-08-21 MED ORDER — STERILE WATER FOR INJECTION IJ SOLN
INTRAMUSCULAR | Status: AC
  Filled 2014-08-21: qty 10

## 2014-08-21 MED ORDER — HYDROMORPHONE HCL 1 MG/ML IJ SOLN
0.5000 mg | INTRAMUSCULAR | Status: DC | PRN
  Administered 2014-08-21 – 2014-08-22 (×3): 1 mg via INTRAVENOUS
  Administered 2014-08-22: 0.5 mg via INTRAVENOUS
  Administered 2014-08-22 – 2014-08-25 (×4): 1 mg via INTRAVENOUS
  Filled 2014-08-21 (×8): qty 1

## 2014-08-21 MED ORDER — ROCURONIUM BROMIDE 50 MG/5ML IV SOLN
INTRAVENOUS | Status: AC
  Filled 2014-08-21: qty 2

## 2014-08-21 MED ORDER — CEFAZOLIN SODIUM-DEXTROSE 2-3 GM-% IV SOLR
2.0000 g | Freq: Three times a day (TID) | INTRAVENOUS | Status: AC
  Administered 2014-08-21 – 2014-08-22 (×2): 2 g via INTRAVENOUS
  Filled 2014-08-21 (×2): qty 50

## 2014-08-21 MED ORDER — PROPOFOL 10 MG/ML IV BOLUS
INTRAVENOUS | Status: DC | PRN
  Administered 2014-08-21: 150 mg via INTRAVENOUS
  Administered 2014-08-21: 20 mg via INTRAVENOUS
  Administered 2014-08-21: 30 mg via INTRAVENOUS

## 2014-08-21 MED ORDER — PHENOL 1.4 % MT LIQD: 1.0000 | OROMUCOSAL | Status: DC | PRN

## 2014-08-21 MED ORDER — EPHEDRINE SULFATE 50 MG/ML IJ SOLN
INTRAMUSCULAR | Status: DC | PRN
  Administered 2014-08-21 (×2): 10 mg via INTRAVENOUS
  Administered 2014-08-21: 15 mg via INTRAVENOUS
  Administered 2014-08-21: 5 mg via INTRAVENOUS
  Administered 2014-08-21: 10 mg via INTRAVENOUS

## 2014-08-21 MED ORDER — POTASSIUM CHLORIDE IN NACL 20-0.9 MEQ/L-% IV SOLN
INTRAVENOUS | Status: DC
  Administered 2014-08-21: 20:00:00 via INTRAVENOUS
  Filled 2014-08-21 (×5): qty 1000

## 2014-08-21 MED ORDER — FENTANYL CITRATE (PF) 250 MCG/5ML IJ SOLN
INTRAMUSCULAR | Status: AC
  Filled 2014-08-21: qty 5

## 2014-08-21 MED ORDER — NEOSTIGMINE METHYLSULFATE 10 MG/10ML IV SOLN
INTRAVENOUS | Status: AC
  Filled 2014-08-21: qty 3

## 2014-08-21 MED ORDER — LISINOPRIL 10 MG PO TABS
10.0000 mg | ORAL_TABLET | Freq: Every day | ORAL | Status: DC
  Administered 2014-08-22 – 2014-08-29 (×7): 10 mg via ORAL
  Filled 2014-08-21 (×8): qty 1

## 2014-08-21 MED ORDER — LIDOCAINE-EPINEPHRINE 0.5 %-1:200000 IJ SOLN
INTRAMUSCULAR | Status: DC | PRN
  Administered 2014-08-21: 50 mL

## 2014-08-21 MED ORDER — EPHEDRINE SULFATE 50 MG/ML IJ SOLN
INTRAMUSCULAR | Status: AC
  Filled 2014-08-21: qty 1

## 2014-08-21 MED ORDER — SUCCINYLCHOLINE CHLORIDE 20 MG/ML IJ SOLN
INTRAMUSCULAR | Status: DC | PRN
  Administered 2014-08-21: 100 mg via INTRAVENOUS

## 2014-08-21 MED ORDER — MAGNESIUM OXIDE 400 (241.3 MG) MG PO TABS
400.0000 mg | ORAL_TABLET | Freq: Every day | ORAL | Status: DC
  Administered 2014-08-22 – 2014-08-30 (×9): 400 mg via ORAL
  Filled 2014-08-21 (×9): qty 1

## 2014-08-21 MED ORDER — LIDOCAINE HCL (CARDIAC) 20 MG/ML IV SOLN
INTRAVENOUS | Status: DC | PRN
  Administered 2014-08-21: 40 mg via INTRAVENOUS
  Administered 2014-08-21: 60 mg via INTRAVENOUS

## 2014-08-21 MED ORDER — SODIUM CHLORIDE 0.9 % IV SOLN
INTRAVENOUS | Status: DC | PRN
  Administered 2014-08-21: 13:00:00 via INTRAVENOUS

## 2014-08-21 MED ORDER — POLYVINYL ALCOHOL 1.4 % OP SOLN
1.0000 [drp] | Freq: Every day | OPHTHALMIC | Status: DC
  Administered 2014-08-22 – 2014-08-30 (×9): 1 [drp] via OPHTHALMIC
  Filled 2014-08-21: qty 15

## 2014-08-21 MED ORDER — ONDANSETRON HCL 4 MG/2ML IJ SOLN
INTRAMUSCULAR | Status: AC
  Filled 2014-08-21: qty 2

## 2014-08-21 MED ORDER — LACTATED RINGERS IV SOLN
INTRAVENOUS | Status: DC | PRN
  Administered 2014-08-21 (×3): via INTRAVENOUS

## 2014-08-21 MED ORDER — NEOSTIGMINE METHYLSULFATE 10 MG/10ML IV SOLN
INTRAVENOUS | Status: AC
  Filled 2014-08-21: qty 1

## 2014-08-21 MED ORDER — ACETAMINOPHEN 325 MG PO TABS
650.0000 mg | ORAL_TABLET | ORAL | Status: DC | PRN
  Administered 2014-08-26 – 2014-08-28 (×3): 650 mg via ORAL
  Filled 2014-08-21 (×3): qty 2

## 2014-08-21 MED ORDER — BISACODYL 5 MG PO TBEC
5.0000 mg | DELAYED_RELEASE_TABLET | Freq: Every day | ORAL | Status: DC | PRN
  Filled 2014-08-21: qty 1

## 2014-08-21 MED ORDER — PHENYLEPHRINE HCL 10 MG/ML IJ SOLN
INTRAMUSCULAR | Status: DC | PRN
  Administered 2014-08-21 (×2): 80 ug via INTRAVENOUS
  Administered 2014-08-21: 40 ug via INTRAVENOUS
  Administered 2014-08-21 (×5): 80 ug via INTRAVENOUS
  Administered 2014-08-21: 40 ug via INTRAVENOUS
  Administered 2014-08-21 (×2): 80 ug via INTRAVENOUS

## 2014-08-21 MED ORDER — VECURONIUM BROMIDE 10 MG IV SOLR
INTRAVENOUS | Status: AC
  Filled 2014-08-21: qty 10

## 2014-08-21 MED ORDER — 0.9 % SODIUM CHLORIDE (POUR BTL) OPTIME
TOPICAL | Status: DC | PRN
  Administered 2014-08-21 (×2): 1000 mL

## 2014-08-21 MED ORDER — CETYLPYRIDINIUM CHLORIDE 0.05 % MT LIQD: 7.0000 mL | Freq: Four times a day (QID) | OROMUCOSAL | Status: DC

## 2014-08-21 MED ORDER — MIDAZOLAM HCL 5 MG/5ML IJ SOLN
INTRAMUSCULAR | Status: DC | PRN
  Administered 2014-08-21 (×2): 1 mg via INTRAVENOUS

## 2014-08-21 MED ORDER — ACETAMINOPHEN 650 MG RE SUPP: 650.0000 mg | RECTAL | Status: DC | PRN

## 2014-08-21 MED ORDER — PANTOPRAZOLE SODIUM 20 MG PO TBEC
20.0000 mg | DELAYED_RELEASE_TABLET | Freq: Every day | ORAL | Status: DC
  Administered 2014-08-22 – 2014-08-30 (×9): 20 mg via ORAL
  Filled 2014-08-21 (×9): qty 1

## 2014-08-21 MED ORDER — LACTATED RINGERS IV SOLN: INTRAVENOUS | Status: DC

## 2014-08-21 MED ORDER — DIAZEPAM 5 MG PO TABS
5.0000 mg | ORAL_TABLET | Freq: Four times a day (QID) | ORAL | Status: DC | PRN
  Administered 2014-08-24 – 2014-08-28 (×2): 5 mg via ORAL
  Filled 2014-08-21 (×2): qty 1

## 2014-08-21 MED ORDER — GLYCOPYRROLATE 0.2 MG/ML IJ SOLN
INTRAMUSCULAR | Status: AC
  Filled 2014-08-21: qty 4

## 2014-08-21 MED ORDER — LORATADINE 10 MG PO TABS
10.0000 mg | ORAL_TABLET | Freq: Every day | ORAL | Status: DC
  Administered 2014-08-22 – 2014-08-30 (×9): 10 mg via ORAL
  Filled 2014-08-21 (×9): qty 1

## 2014-08-21 MED ORDER — GLYCOPYRROLATE 0.2 MG/ML IJ SOLN
INTRAMUSCULAR | Status: DC | PRN
  Administered 2014-08-21: 0.2 mg via INTRAVENOUS
  Administered 2014-08-21: 0.6 mg via INTRAVENOUS

## 2014-08-21 MED ORDER — PROPOFOL INFUSION 10 MG/ML OPTIME
INTRAVENOUS | Status: DC | PRN
  Administered 2014-08-21: 100 ug/kg/min via INTRAVENOUS

## 2014-08-21 MED ORDER — ZOLPIDEM TARTRATE 5 MG PO TABS
5.0000 mg | ORAL_TABLET | Freq: Every evening | ORAL | Status: DC | PRN
  Administered 2014-08-29 (×2): 5 mg via ORAL
  Filled 2014-08-21 (×2): qty 1

## 2014-08-21 MED ORDER — ROCURONIUM BROMIDE 100 MG/10ML IV SOLN
INTRAVENOUS | Status: DC | PRN
  Administered 2014-08-21: 10 mg via INTRAVENOUS
  Administered 2014-08-21: 50 mg via INTRAVENOUS
  Administered 2014-08-21 (×2): 20 mg via INTRAVENOUS

## 2014-08-21 MED ORDER — SENNA 8.6 MG PO TABS
1.0000 | ORAL_TABLET | Freq: Two times a day (BID) | ORAL | Status: DC
  Administered 2014-08-22 – 2014-08-30 (×18): 8.6 mg via ORAL
  Filled 2014-08-21 (×19): qty 1

## 2014-08-21 SURGICAL SUPPLY — 83 items
BAG DECANTER FOR FLEXI CONT (MISCELLANEOUS) IMPLANT
BENZOIN TINCTURE PRP APPL 2/3 (GAUZE/BANDAGES/DRESSINGS) IMPLANT
BIT DRILL PLIF MAS 5.0MM DISP (DRILL) ×1 IMPLANT
BIT DRILL PLIF MAS DISP 5.5MM (DRILL) ×1 IMPLANT
BLADE CLIPPER SURG (BLADE) ×2 IMPLANT
BUR MATCHSTICK NEURO 3.0 LAGG (BURR) ×2 IMPLANT
BUR ROUND FLUTED 4 SOFT TCH (BURR) ×2 IMPLANT
CANISTER SUCT 3000ML PPV (MISCELLANEOUS) IMPLANT
CONT SPEC 4OZ CLIKSEAL STRL BL (MISCELLANEOUS) ×2 IMPLANT
COVER BACK TABLE 60X90IN (DRAPES) ×2 IMPLANT
DECANTER SPIKE VIAL GLASS SM (MISCELLANEOUS) ×2 IMPLANT
DIGITIZER BENDINI (MISCELLANEOUS) ×2 IMPLANT
DRAPE C-ARM 42X72 X-RAY (DRAPES) ×4 IMPLANT
DRAPE C-ARMOR (DRAPES) IMPLANT
DRAPE LAPAROTOMY 100X72X124 (DRAPES) ×2 IMPLANT
DRAPE POUCH INSTRU U-SHP 10X18 (DRAPES) ×2 IMPLANT
DRAPE SURG 17X23 STRL (DRAPES) ×2 IMPLANT
DRILL PLIF MAS 5.0MM DISP (DRILL) ×2
DRILL PLIF MAS DISP 5.5MM (DRILL) ×2
DRSG OPSITE POSTOP 4X10 (GAUZE/BANDAGES/DRESSINGS) ×2 IMPLANT
DRSG OPSITE POSTOP 4X6 (GAUZE/BANDAGES/DRESSINGS) ×2 IMPLANT
DRSG TELFA 3X8 NADH (GAUZE/BANDAGES/DRESSINGS) IMPLANT
DURAPREP 26ML APPLICATOR (WOUND CARE) ×2 IMPLANT
ELECT BLADE 4.0 EZ CLEAN MEGAD (MISCELLANEOUS) ×2
ELECT CAUTERY BLADE 6.4 (BLADE) ×2 IMPLANT
ELECT REM PT RETURN 9FT ADLT (ELECTROSURGICAL) ×2
ELECTRODE BLDE 4.0 EZ CLN MEGD (MISCELLANEOUS) ×1 IMPLANT
ELECTRODE REM PT RTRN 9FT ADLT (ELECTROSURGICAL) ×1 IMPLANT
GAUZE SPONGE 4X4 12PLY STRL (GAUZE/BANDAGES/DRESSINGS) IMPLANT
GAUZE SPONGE 4X4 16PLY XRAY LF (GAUZE/BANDAGES/DRESSINGS) ×4 IMPLANT
GLOVE BIO SURGEON STRL SZ 6.5 (GLOVE) ×6 IMPLANT
GLOVE BIO SURGEON STRL SZ7 (GLOVE) ×6 IMPLANT
GLOVE BIO SURGEON STRL SZ8 (GLOVE) ×2 IMPLANT
GLOVE BIOGEL PI IND STRL 6.5 (GLOVE) ×1 IMPLANT
GLOVE BIOGEL PI IND STRL 7.5 (GLOVE) ×4 IMPLANT
GLOVE BIOGEL PI IND STRL 8.5 (GLOVE) ×1 IMPLANT
GLOVE BIOGEL PI INDICATOR 6.5 (GLOVE) ×1
GLOVE BIOGEL PI INDICATOR 7.5 (GLOVE) ×4
GLOVE BIOGEL PI INDICATOR 8.5 (GLOVE) ×1
GLOVE ECLIPSE 6.5 STRL STRAW (GLOVE) ×4 IMPLANT
GLOVE ECLIPSE 7.5 STRL STRAW (GLOVE) ×4 IMPLANT
GLOVE EXAM NITRILE LRG STRL (GLOVE) IMPLANT
GLOVE EXAM NITRILE MD LF STRL (GLOVE) IMPLANT
GLOVE EXAM NITRILE XL STR (GLOVE) IMPLANT
GLOVE EXAM NITRILE XS STR PU (GLOVE) IMPLANT
GLOVE SURG SS PI 7.0 STRL IVOR (GLOVE) ×4 IMPLANT
GOWN STRL REUS W/ TWL LRG LVL3 (GOWN DISPOSABLE) ×4 IMPLANT
GOWN STRL REUS W/ TWL XL LVL3 (GOWN DISPOSABLE) ×3 IMPLANT
GOWN STRL REUS W/TWL 2XL LVL3 (GOWN DISPOSABLE) IMPLANT
GOWN STRL REUS W/TWL LRG LVL3 (GOWN DISPOSABLE) ×4
GOWN STRL REUS W/TWL XL LVL3 (GOWN DISPOSABLE) ×3
KIT BASIN OR (CUSTOM PROCEDURE TRAY) ×2 IMPLANT
KIT POSITION SURG JACKSON T1 (MISCELLANEOUS) ×2 IMPLANT
KIT ROOM TURNOVER OR (KITS) ×2 IMPLANT
LIQUID BAND (GAUZE/BANDAGES/DRESSINGS) ×2 IMPLANT
MILL MEDIUM DISP (BLADE) ×2 IMPLANT
NEEDLE HYPO 25X1 1.5 SAFETY (NEEDLE) ×2 IMPLANT
NEEDLE SPNL 18GX3.5 QUINCKE PK (NEEDLE) ×2 IMPLANT
NS IRRIG 1000ML POUR BTL (IV SOLUTION) ×6 IMPLANT
PACK LAMINECTOMY NEURO (CUSTOM PROCEDURE TRAY) ×2 IMPLANT
PAD ARMBOARD 7.5X6 YLW CONV (MISCELLANEOUS) ×6 IMPLANT
PATTIES SURGICAL 1X1 (DISPOSABLE) ×2 IMPLANT
PENCIL BUTTON HOLSTER BLD 10FT (ELECTRODE) ×2 IMPLANT
ROD PRECEPT 300MM SPINE (Rod) ×4 IMPLANT
SCREW LOCK (Screw) ×8 IMPLANT
SCREW LOCK FXNS SPNE MAS PL (Screw) ×8 IMPLANT
SCREW SHANK 5.0X35 (Screw) ×8 IMPLANT
SCREW SHANK 5.5X30MM (Screw) ×4 IMPLANT
SCREW SHANKS 5.5X35 (Screw) ×4 IMPLANT
SCREW TULIP 5.5 (Screw) ×16 IMPLANT
SPACER RISE 7-13MM 10X22 (Spacer) ×8 IMPLANT
SPONGE LAP 4X18 X RAY DECT (DISPOSABLE) ×8 IMPLANT
SPONGE SURGIFOAM ABS GEL 100 (HEMOSTASIS) ×2 IMPLANT
STRIP CLOSURE SKIN 1/2X4 (GAUZE/BANDAGES/DRESSINGS) IMPLANT
SUT PROLENE 6 0 BV (SUTURE) ×2 IMPLANT
SUT VIC AB 0 CT1 18XCR BRD8 (SUTURE) ×3 IMPLANT
SUT VIC AB 0 CT1 8-18 (SUTURE) ×3
SUT VIC AB 2-0 CT1 18 (SUTURE) ×4 IMPLANT
SUT VIC AB 3-0 SH 8-18 (SUTURE) ×6 IMPLANT
SYR 20ML ECCENTRIC (SYRINGE) ×2 IMPLANT
TOWEL OR 17X24 6PK STRL BLUE (TOWEL DISPOSABLE) ×2 IMPLANT
TOWEL OR 17X26 10 PK STRL BLUE (TOWEL DISPOSABLE) ×2 IMPLANT
WATER STERILE IRR 1000ML POUR (IV SOLUTION) ×2 IMPLANT

## 2014-08-21 NOTE — Op Note (Signed)
08/21/2014  5:56 PM  PATIENT:  Richard Wallace  69 y.o. 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 His curve had progressed over the years and in concert his pain also worsened. 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,4/5, neurogenic claudication  POST-OPERATIVE DIAGNOSIS:  Same, and L4/5 left synovial cyst  PROCEDURE:  Procedure(s):Lumbar posterior lumbar interbody arthrodesis L2/3,3/4, with Rise(globus) cages, morselized autograft Lumbar decompression L2/3,3/4 beyond the needed bone work to perform a PLIF, complete laminectomies and inferior facetectomies L2, L3, L4 bilaterally Lumbar decompression L4, L5 nerve roots bilaterally Posterolateral arthrodesis L2-L5, moreselized autograft Segmental pedicle screw fixation L2-L5 Nuvasive Mas plif   SURGEON:  Surgeon(s): Ashok Pall, MD Kary Kos, MD  ASSISTANTS:Cram, Dominica Severin  ANESTHESIA:   general  EBL:  Total I/O In: 4401 [I.V.:5350; Blood:580; IV Piggyback:500] Out: 3000 [Urine:1800; Blood:1200]  BLOOD ADMINISTERED:580 CC CELLSAVER  CELL SAVER GIVEN:yes  COUNT:per nursing  DRAINS: none   SPECIMEN:  No Specimen  DICTATION: Richard Wallace is a 69 y.o. male whom was taken to the operating room intubated, and placed under a general anesthetic without difficulty. A foley catheter was placed under sterile conditions. He was positioned prone on a Jackson stable with all pressure points properly padded.  His lumbar region was prepped and draped in a sterile manner. I infiltrated 30cc's 1/2%lidocaine/1:2000,000 strength epinephrine into the planned incision. I opened the skin with a 10 blade and took the incision down to the thoracolumbar fascia. I exposed the lamina of L1,2,3,4,and 5 in a  subperiosteal fashion bilaterally. I confirmed my location with an intraoperative xray.  I placed self retaining retractors and started the decompression.  I decompressed the spinal canal, and the L2,3,4, and 5 nerve roots via complete laminectomies of L2, 3, and 4 bilaterally, and hemilaminectomy of L5. Inferior facetectomies were also performed at L2,3, and L4 beyond the needed exposure for a Plif at L2/3, and at L3/4. I used the drill and Kerrison punches. The ligamentum flavum was hypertrophied and removed with Kerrison punches. At the L4/5 facet I encountered a fairly large synovial cyst very adherent to the dura. I was able to free the cyst from its ligamentous attachments, but did not try to take it off the dura. The cyst was no longer causing any traction on the dura nor was it compressing the thecal sac or left L5 root. I did not try to open the disc space at the level of the L4/5 where he had a grade l listheis, but did aggressively fully decompress the L4 roots. PLIF's were performed at L2/3, and L3/4 in the same fashion. I opened the disc space with a 15 blade then used a variety of instruments to remove the disc and prepare the space for the arthrodesis. I used curettes, rongeurs, punches, shavers for the disc space, and rasps in the discetomy. We placed 4 Rise expandable  Peek cages(Globus) into the disc space(s) at L2/3, and 3/4.Due to the scoliosis we placed all the cages. With fluoroscopy we then expanded the right sides cages to partially correct the coronal scoliosis, and the sagittal balance. We then expanded the left sided cages to complete the plif. The ap and lateral views looked quite good on fluoroscopy.  I decorticated the lateral bone at L2,3,4,and 5 with the drill.The transverse processes had been exposed bilaterally. I then placed morselized autograft  on the decorticated surfaces to complete the posterolateral arthrodesis.  I placed pedicle screws at L2,3,4,and 5, using fluoroscopic  guidance. I drilled a pilot hole, then cannulated the pedicle with a drill at each site. I then tapped each pedicle, assessing each site for pedicle violations. No cutouts were appreciated. Screws (Nuvasive mas plif) were then placed at each site without difficulty. Tulip heads were then placed on the shanks. I used the Bendini device to custom contour the rods and to provide for correction of the coronal curve. Locking caps were placed to secure the rods without difficulty. They were locked into position with the torque driver.Final films were performed and all screws appeared to be in good position.  I closed the wound in a layered fashion. I approximated the thoracolumbar fascia, subcutaneous, and subcuticular planes with vicryl sutures. I used dermabond and an occlusive bandage for a sterile dressing.     PLAN OF CARE: Admit to inpatient   PATIENT DISPOSITION:  ICU - intubated and hemodynamically stable.   Delay start of Pharmacological VTE agent (>24hrs) due to surgical blood loss or risk of bleeding:  yes

## 2014-08-21 NOTE — Procedures (Signed)
Extubation Procedure Note  Patient Details:   Name: Richard Wallace DOB: 07-Jan-1946 MRN: [id number]   Airway Documentation:     Evaluation  O2 sats: stable throughout Complications: No apparent complications Patient did tolerate procedure well. Bilateral Breath Sounds: Clear Suctioning: Airway Yes, pt able to cough and state name. Pt had cuff leak and a VT consistent at 600 or greater. Placed pt on Nasal cannula with humidity,   Mizani Dilday E 08/21/2014, 10:09 PM

## 2014-08-21 NOTE — Anesthesia Procedure Notes (Signed)
Procedure Name: Intubation Date/Time: 08/21/2014 8:24 AM Performed by: Scheryl Darter Pre-anesthesia Checklist: Patient identified, Emergency Drugs available, Suction available, Patient being monitored and Timeout performed Patient Re-evaluated:Patient Re-evaluated prior to inductionOxygen Delivery Method: Circle system utilized Preoxygenation: Pre-oxygenation with 100% oxygen Intubation Type: IV induction Ventilation: Mask ventilation without difficulty Laryngoscope Size: Glidescope Grade View: Grade I Tube type: Oral Tube size: 7.5 mm Number of attempts: 1 Airway Equipment and Method: Stylet Secured at: 23 cm Tube secured with: Tape Dental Injury: Teeth and Oropharynx as per pre-operative assessment

## 2014-08-21 NOTE — H&P (Addendum)
BP 134/72 mmHg  Pulse 76  Temp(Src) 98.1 F (36.7 C) (Oral)  Resp 20  Wt 102.059 kg (225 lb)  SpO2 96%   HISTORY OF PRESENT ILLNESS:                     [name] is a patient of mine whom I last saw in 2011 for evaluation of pain he had in his lower back and right lower extremity.  His symptoms were very similar to what they are now, except for the fact that currently he has significant pain in both lower extremities, classic for neurogenic claudication in both instances.  Today, he has significant pain whenever he stands and walks and has to use now a walker for the last two months.  [name] denies any bowel or bladder dysfunction.  He has been having significant pain in his lower back for 2-1/2 to three months.  His right foot will go numb, and his left foot is even more numb when he is up walking.  He has had a prednisone taper without help.  He has done physical therapy for three weeks without help.  He does feel weak in his lower extremities, but he feels fairly normal while he is sitting or lying down.  The worst pain is when he is trying to turn over in bed and when he first wakes up in the morning.  The worst pain he feels is in the buttocks and posterior thigh.  He is 64 and a retired [employer].  He is right-handed.  In his own words, back pain from the lower back all the way down to feet, all in rear.             REVIEW OF SYSTEMS:                                    Positive for eyeglasses, hypertension, leg pain with walking, nephrolithiasis, leg weakness, back pain, leg pain, joint pain, arthritis.  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  Denies allergic, hematologic, endocrine, psychiatric, neurological, skin, gastrointestinal, respiratory, ear, nose and throat problems.                                                                      PAST MEDICAL HISTORY:                               Past medical history significant for  hypertension, melanoma also.              Prior Operations:  He has had carpal tunnel release bilaterally.  He has had an umbilical hernia repaired and has had fusion of the right ankle.  This is secondary to multiple accidents that he had and operations he was having on his right lower extremity.  He has a limb-length discrepancy of approximately 3 inches.              Medications and Allergies:  He has no known drug allergies.  He takes allopurinol, Prevacid, Centrum Silver, baby aspirin,  lisinopril, hydrochlorothiazide, acetaminophen as needed, Naprosyn as needed.     FAMILY HISTORY:                                            Mother and father both deceased, one secondary to cerebrovascular accident, the other secondary to prostate cancer.     SOCIAL HISTORY:                                            He does not smoke.  He does not use alcohol.  He does use illicit drugs.  He says that he has gained 10-12 pounds since his back problem.     PHYSICAL EXAMINATION:                                On examination, he is 5 feet 10 inches.  He weighs 230 pounds.  Blood pressure is 125/76, pulse is 84, respiratory rate 15, temperature is 97.8.  Pain is 5/10.     He lists pain in the posterior back, thighs and legs.                  NEUROLOGICAL EXAMINATION:           On physical exam, he is alert, oriented x4, answers all questions appropriately.  Memory, language, attention span and fund of knowledge normal.  Well-kempt and in obvious distress.  He is using a walker.  Without the walker he has a markedly antalgic gait.              Cranial Nerve Examination - Pupils equal, round and reactive to light.  Full extraocular movements.  Symmetric facial sensation and movement.  Hearing intact to voice.  Uvula elevates midline. Shoulder shrug is normal.  Tongue protrudes to the midline.              Deep Tendon Reflexes - Reflexes trace at the right ankle, 2+ left ankle, 2+ at the  knees, 2+ biceps, triceps, brachioradialis.  Intact proprioception.  Romberg is negative.  He cannot heel walk/toe walk on the right foot because he has a fused ankle.  On the left side, he can do this with ease.  He has no clonus, no Hoffman sign.     IMAGING STUDIES:                                          MRI of the lumbar spine is reviewed.  Markedly stenotic at 4-5, 3-4.  He has a large herniated disk on the right at 4-5.  Significant ligamentous hypertrophy at 4-5 on the right side.  There is also some on the left.  Disk bulge at 4-5 also; 3-4 is just stenotic, 2-3 is slightly stenotic.  Conus is otherwise normal.  Cauda equina is normal. r. Richard Wallace returned today.  Unfortunately, the epidural injections have not helped.  He has reconciled to undergoing a very large surgery involving a decompression at L2-3, 3-4, 4-5, pedicle screw fixation from L2 to L5, posterolateral arthrodesis, and interbody arthrodesis.  We went over, in great detail, the operation,  what expectations would be.  He is coming in using a cane and just says that he has slowed down so much he knows it is time to just do something.

## 2014-08-21 NOTE — Consult Note (Signed)
PULMONARY  / CRITICAL CARE MEDICINE CONSULTATION   Name: Richard Wallace MRN: [id number] DOB: 05-Aug-1945    ADMISSION DATE:  08/21/2014 CONSULTATION DATE: 08/21/2014  REQUESTING CLINICIAN: Dr. Christella Noa PRIMARY SERVICE: [facility]  CHIEF COMPLAINT:  S/p Lumbar spinal fusion  BRIEF PATIENT DESCRIPTION: [name] is a 16 M with CKD, HTN who underwent spinal laminectomy and fusion. Patient transferred from OR intubated for unclear reasons. PCCM consulted to [facility].  SIGNIFICANT EVENTS / STUDIES:  Posterior laminectomy and fusion 4/15  LINES / TUBES: PIV x 2 A-line 4/15 Foley 4/15  CULTURES: None  ANTIBIOTICS: None  HISTORY OF PRESENT ILLNESS:  [name] is a 24 M with CKD and HTN who underwent posterior lumbar fusion on 4/15. The procedure was long and, by report, anesthesia wanted to keep the patient intubated 2/2 the duration of the procedure. He is currently sedated with propofol and unable to provide any history.    PAST MEDICAL HISTORY :  Past Medical History  Diagnosis Date  . Hypertension   . GERD (gastroesophageal reflux disease)   . Arthritis   . 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 Seasonal allergies   . Wears glasses   . Chronic kidney disease     kidney stone on left side with kidney damage form the stone   . Heart murmur   . Cancer 01-28-2014    skin cancer   Past Surgical History  Procedure Laterality Date  . Carpal tunnel release  2011    left with lt long tf  . Cystoscopy w/ ureteral stent placement  2012    left  . Cystoscopy w/ stone manipulation  2012  . Orif ankle fracture  1978    right  . Umbilical hernia repair    . Colonoscopy    . Carpal tunnel release Right 07/22/2013    Procedure: RIGHT CARPAL TUNNEL RELEASE;  Surgeon: Cammie Sickle., MD;  Location: [facility];  Service: Orthopedics;  Laterality: Right;  . Skin ca removed  01-28-2014    removed from his head  . Tonsillectomy    . Right leg tibia      has had 3 surgeries   Prior to Admission medications   Medication Sig Start Date End Date Taking? Authorizing Provider  acetaminophen (TYLENOL) 500 MG tablet Take 500 mg by mouth every 6 (six) hours as needed for mild pain or moderate pain.   Yes Historical Provider, MD  allopurinol (ZYLOPRIM) 300 MG tablet Take 300 mg by mouth daily.   Yes Historical Provider, MD  cetirizine (ZYRTEC) 10 MG tablet Take 10 mg by mouth daily.   Yes Historical Provider, MD  HYDROcodone-acetaminophen (NORCO/VICODIN) 5-325 MG per tablet Take 1 tablet by mouth every 6 (six) hours as needed for moderate pain. 07/22/13  Yes Theodis Sato, MD  lansoprazole (PREVACID) 15 MG capsule Take 15 mg by mouth daily.    Yes Historical Provider, MD  lisinopril-hydrochlorothiazide (PRINZIDE,ZESTORETIC) 10-12.5 MG per tablet Take 1 tablet by mouth daily.   Yes Historical Provider, MD  magnesium oxide (MAG-OX) 400 MG tablet Take 400 mg by mouth daily.   Yes Historical Provider, MD  Multiple Vitamins-Minerals (MULTIVITAMIN WITH MINERALS) tablet Take 1 tablet by mouth daily.   Yes Historical Provider, MD  naproxen (NAPROSYN) 500 MG tablet Take 500 mg by mouth 2 (two) times daily with a meal.   Yes Historical Provider, MD  Polyethyl Glycol-Propyl Glycol (SYSTANE OP) Apply 1 drop to eye every morning.  Yes Historical Provider, MD   No Known Allergies  FAMILY HISTORY:  History reviewed. No pertinent family history. SOCIAL HISTORY:  reports that he quit smoking about 28 years ago. He does not have any smokeless tobacco history on file. He reports that he drinks alcohol. He reports that he does not use illicit drugs.  REVIEW OF SYSTEMS:  Unable to obtain secondary to patient condition. SUBJECTIVE:   VITAL SIGNS: Temp:  [98.1 F (36.7 C)-99 F (37.2 C)] 99 F (37.2 C) (04/15 1950) Pulse Rate:  [75-103] 98 (04/15 2000) Resp:  [12-24] 21 (04/15 2000) BP: (74-134)/(45-111) 111/57 mmHg (04/15 2000) SpO2:  [96 %-100 %] 100 % (04/15  2000) FiO2 (%):  [50 %] 50 % (04/15 1929) Weight:  [225 lb (102.059 kg)] 225 lb (102.059 kg) (04/15 0555) HEMODYNAMICS:   VENTILATOR SETTINGS: Vent Mode:  [-] PRVC FiO2 (%):  [50 %] 50 % Set Rate:  [12 bmp] 12 bmp Vt Set:  [580 mL] 580 mL PEEP:  [5 cmH20] 5 cmH20 Plateau Pressure:  [9 cmH20-18 cmH20] 18 cmH20 INTAKE / OUTPUT: Intake/Output      04/15 0701 - 04/16 0700   I.V. (mL/kg) 5546.6 (54.3)   Blood 580   IV Piggyback 500   Total Intake(mL/kg) 6626.6 (64.9)   Urine (mL/kg/hr) 1950 (1.4)   Blood 1200 (0.8)   Total Output 3150   Net +3476.6         PHYSICAL EXAMINATION: General:  Elderly M in NAD Neuro: Unable to assess   HEENT:  Sclera anicteric, conjunctiva pink, MMM  Neck: Trachea supple and midline, (-) LAN or JVD Cardiovascular:  RRR, nS1/S2, (-) MRG Lungs:  CTAB Abdomen:  S/NT/ND/(+)BS Musculoskeletal:  (-) C/C/E Skin:  Intact  LABS:  CBC No results for input(s): WBC, HGB, HCT, PLT in the last 168 hours. Coag's No results for input(s): APTT, INR in the last 168 hours. BMET No results for input(s): NA, K, CL, CO2, BUN, CREATININE, GLUCOSE in the last 168 hours. Electrolytes No results for input(s): CALCIUM, MG, PHOS in the last 168 hours. Sepsis Markers No results for input(s): LATICACIDVEN, PROCALCITON, O2SATVEN in the last 168 hours. ABG No results for input(s): PHART, PCO2ART, PO2ART in the last 168 hours. Liver Enzymes No results for input(s): AST, ALT, ALKPHOS, BILITOT, ALBUMIN in the last 168 hours. Cardiac Enzymes No results for input(s): TROPONINI, PROBNP in the last 168 hours. Glucose No results for input(s): GLUCAP in the last 168 hours.  Imaging Dg Lumbar Spine 2-3 Views  08/21/2014   CLINICAL DATA:  Lumbar fusion. Intraoperative fluoroscopic spot views.  EXAM: LUMBAR SPINE - 2-3 VIEW; DG C-ARM GT 120 MIN  COMPARISON:  Plain films lumbar spine 07/14/2014.  FINDINGS: We are provided with 2 intraoperative fluoroscopic spot views lumbar  spine. Images demonstrate pedicle screws from L2-L5. Interbody spacers are in place at L2-3 and L3-4.  IMPRESSION: L2-5 fusion in progress.   Electronically Signed   By: Inge Rise M.D.   On: 08/21/2014 16:48   Dg C-arm Gt 120 Min  08/21/2014   CLINICAL DATA:  Lumbar fusion. Intraoperative fluoroscopic spot views.  EXAM: LUMBAR SPINE - 2-3 VIEW; DG C-ARM GT 120 MIN  COMPARISON:  Plain films lumbar spine 07/14/2014.  FINDINGS: We are provided with 2 intraoperative fluoroscopic spot views lumbar spine. Images demonstrate pedicle screws from L2-L5. Interbody spacers are in place at L2-3 and L3-4.  IMPRESSION: L2-5 fusion in progress.   Electronically Signed   By: Inge Rise M.D.  On: 08/21/2014 16:48    EKG: None CXR: None  ASSESSMENT / PLAN:  PULMONARY A: Mechanical Ventilation: Unclear why patient was left on mechanical ventilation. Requires propofol to tolerate vent and it makes him hypotensive. I discussed my concerns about continuing mechanical ventilation with Dr. Saintclair Halsted of neurosurgery. He agrees that extubation is reasonable.  Allergies: P:   SBT Check ABG If Hypotension Resolves with lightening of sedation and  Cont OP allergy regimen  CARDIOVASCULAR A: Hypotension: Per RN likely 2/2 propofol.  Hypertension: P:   IVF Lighten sedation with goal of extubation Hold OP anti-hypertives  RENAL A: CKD:  P:   Serial Cr  GASTROINTESTINAL A: GERD:  P:   Cont Pantoprazole  HEMATOLOGIC A: No acute issues   INFECTIOUS A: No acute issues P:   Perioperative abx per [facility]  ENDOCRINE A: No acute issues  NEUROLOGIC A: S/p Decompression:  P:   Management per [facility]  Rheumatic: A: Gout: P: Cont Allopurinol  TODAY'S SUMMARY:   I have personally obtained a history, examined the patient, evaluated laboratory and imaging results, formulated the assessment and plan and placed orders.  CRITICAL CARE: The patient is critically ill with multiple organ systems  failure and requires high complexity decision making for assessment and support, frequent evaluation and titration of therapies, application of advanced monitoring technologies and extensive interpretation of multiple databases. Critical Care Time devoted to patient care services described in this note is 30 minutes.   Margarette Asal, MD Pulmonary and [place] Pager: [phone]   08/21/2014, 9:06 PM

## 2014-08-21 NOTE — Anesthesia Preprocedure Evaluation (Addendum)
Anesthesia Evaluation  Patient identified by MRN, date of birth, ID band Patient awake    Reviewed: Allergy & Precautions, H&P , NPO status , Patient's Chart, lab work & pertinent test results  Airway Mallampati: III  TM Distance: >3 FB Neck ROM: Full    Dental no notable dental hx. (+) Poor Dentition, Dental Advisory Given   Pulmonary neg pulmonary ROS, former smoker,  breath sounds clear to auscultation  Pulmonary exam normal       Cardiovascular hypertension, Pt. on medications Rhythm:Regular Rate:Normal     Neuro/Psych negative neurological ROS  negative psych ROS   GI/Hepatic Neg liver ROS, GERD-  Medicated and Controlled,  Endo/Other  negative endocrine ROS  Renal/GU Renal InsufficiencyRenal disease  negative genitourinary   Musculoskeletal   Abdominal   Peds  Hematology negative hematology ROS (+)   Anesthesia Other Findings   Reproductive/Obstetrics negative OB ROS                           Anesthesia Physical Anesthesia Plan  ASA: III  Anesthesia Plan: General   Post-op Pain Management:    Induction: Intravenous  Airway Management Planned: Oral ETT  Additional Equipment: Arterial line  Intra-op Plan:   Post-operative Plan: Extubation in OR  Informed Consent: I have reviewed the patients History and Physical, chart, labs and discussed the procedure including the risks, benefits and alternatives for the proposed anesthesia with the patient or authorized representative who has indicated his/her understanding and acceptance.   Dental advisory given  Plan Discussed with: CRNA  Anesthesia Plan Comments:        Anesthesia Quick Evaluation

## 2014-08-21 NOTE — Progress Notes (Addendum)
Hypotensive at 1830, propofol halved, then paused, then resumed at 20 mcg. Pt able to open eyes, follow commands. Normotensive now at [place].

## 2014-08-21 NOTE — Transfer of Care (Signed)
Immediate Anesthesia Transfer of Care Note  Patient: Richard Wallace  Procedure(s) Performed: Procedure(s) with comments: L23 L34 L45 posterior lumbar interbody fusion with interbody prosthesis posterior lateral arthrodesis and posterior segmental instrumentation (N/A) - L23 L34 L45 posterior lumbar interbody fusion with interbody prosthesis posterior lateral arthrodesis and posterior segmental instrumentation  Patient Location: NICU  Anesthesia Type:General  Level of Consciousness: sedated, unresponsive and Patient remains intubated per anesthesia plan  Airway & Oxygen Therapy: Patient remains intubated per anesthesia plan and Patient placed on Ventilator (see vital sign flow sheet for setting)  Post-op Assessment: Report given to RN and Post -op Vital signs reviewed and stable  Post vital signs: Reviewed and stable  Last Vitals:  Filed Vitals:   08/21/14 0555  BP: 134/72  Pulse: 76  Temp: 36.7 C  Resp: 20    Complications: No apparent anesthesia complications

## 2014-08-21 NOTE — Progress Notes (Signed)
[place] Progress Note Patient Name: Richard Wallace DOB: Sep 07, 1945 MRN: [id number]   Date of Service  08/21/2014  HPI/Events of Note  Post op lumbar laminectomy and fusion. 7.5 hours of anesthesia.  Intubated and mechanically ventilate.   eICU Interventions  Ventilator orders 50%/PRVC 14/TV 580/P 5 written.  2. Check ABG now.  3. CXR now for ETT placement.  4. Propofol IV infusion for sedation. Titrate to RASS = 0 to -1.     Intervention Category Major Interventions: Respiratory failure - evaluation and management  Baltasar Twilley Eugene 08/21/2014, 7:18 PM

## 2014-08-21 NOTE — Anesthesia Postprocedure Evaluation (Signed)
  Anesthesia Post-op Note  Patient: Richard Wallace  Procedure(s) Performed: Procedure(s) with comments: L23 L34 L45 posterior lumbar interbody fusion with interbody prosthesis posterior lateral arthrodesis and posterior segmental instrumentation (N/A) - L23 L34 L45 posterior lumbar interbody fusion with interbody prosthesis posterior lateral arthrodesis and posterior segmental instrumentation  Patient Location: ICU  Anesthesia Type:General  Level of Consciousness: sedated and unresponsive  Airway and Oxygen Therapy: Patient remains intubated and on ventilator  Post-op Pain: none  Post-op Assessment: Post-op Vital signs reviewed, Patient's Cardiovascular Status Stable and Respiratory Function Stable  Post-op Vital Signs: Reviewed  Filed Vitals:   08/21/14 2000  BP: 111/57  Pulse: 98  Temp:   Resp: 21    Complications: No apparent anesthesia complications

## 2014-08-22 DIAGNOSIS — M4126 Other idiopathic scoliosis, lumbar region: Secondary | ICD-10-CM

## 2014-08-22 DIAGNOSIS — R0902 Hypoxemia: Secondary | ICD-10-CM | POA: Insufficient documentation

## 2014-08-22 DIAGNOSIS — J9811 Atelectasis: Secondary | ICD-10-CM | POA: Insufficient documentation

## 2014-08-22 DIAGNOSIS — I10 Essential (primary) hypertension: Secondary | ICD-10-CM | POA: Insufficient documentation

## 2014-08-22 LAB — BASIC METABOLIC PANEL
Anion gap: 8 (ref 5–15)
BUN: 19 mg/dL (ref 6–23)
CO2: 23 mmol/L (ref 19–32)
Calcium: 8 mg/dL — ABNORMAL LOW (ref 8.4–10.5)
Chloride: 105 mmol/L (ref 96–112)
Creatinine, Ser: 1.17 mg/dL (ref 0.50–1.35)
GFR calc Af Amer: 72 mL/min — ABNORMAL LOW (ref >90)
GFR calc non Af Amer: 62 mL/min — ABNORMAL LOW (ref >90)
Glucose, Bld: 113 mg/dL — ABNORMAL HIGH (ref 70–99)
Potassium: 4.5 mmol/L (ref 3.5–5.1)
Sodium: 136 mmol/L (ref 135–145)

## 2014-08-22 LAB — CBC
HCT: 32.3 % — ABNORMAL LOW (ref 39.0–52.0)
Hemoglobin: 8.7 g/dL — ABNORMAL LOW (ref 13.0–17.0)
MCH: 24.1 pg — ABNORMAL LOW (ref 26.0–34.0)
MCHC: 26.9 g/dL — ABNORMAL LOW (ref 30.0–36.0)
MCV: 89.5 fL (ref 78.0–100.0)
Platelets: 148 10*3/uL — ABNORMAL LOW (ref 150–400)
RBC: 3.61 MIL/uL — ABNORMAL LOW (ref 4.22–5.81)
RDW: 14.7 % (ref 11.5–15.5)
WBC: 9.1 10*3/uL (ref 4.0–10.5)

## 2014-08-22 NOTE — Evaluation (Signed)
Physical Therapy Evaluation Patient Details Name: Richard Wallace MRN: [id number] DOB: 12-17-1945 Today's Date: 08/22/2014   History of Present Illness  [name] is a 35 M with CKD, HTN who underwent spinal laminectomy and fusionMr. Richard Wallace is a 65 M with CKD, HTN who underwent spinal laminectomy and fusion.   Clinical Impression  Patient demonstrates deficits in functional mobility as indicated below. Will need continued skilled PT to address deficits and maximize function. Will see as indicated and progress as tolerated. Patient and spouse educated re: precautions, positioning, pain management and mobility expectations.     Follow Up Recommendations Home health PT;Supervision/Assistance - 24 hour    Equipment Recommendations  None recommended by PT     Recommendations for Other Services       Precautions / Restrictions Precautions Precautions: Back Precaution Comments: verbally educated patient and spouse regarding precautions Required Braces or Orthoses: Spinal Brace Spinal Brace: Lumbar corset Restrictions Weight Bearing Restrictions: No      Mobility  Bed Mobility Overal bed mobility: Needs Assistance Bed Mobility: Rolling;Sidelying to Sit           General bed mobility comments: nsg performed with patient  Transfers Overall transfer level: Needs assistance Equipment used: 2 person hand held assist Transfers: Sit to/from [facility] Sit to Stand: Min assist;+2 physical assistance Stand pivot transfers: Min assist       General transfer comment: VCs for hand placement and safety  Ambulation/Gait                Stairs            Wheelchair Mobility    Modified Rankin (Stroke Patients Only)       Balance                                             Pertinent Vitals/Pain Pain Assessment: 0-10 Pain Score: 5  Pain Location: back Pain Descriptors / Indicators: Aching;Sore Pain Intervention(s): Limited  activity within patient's tolerance;Monitored during session;Repositioned    Home Living Family/patient expects to be discharged to:: Private residence Living Arrangements: Spouse/significant other Available Help at Discharge: Family Type of Home: House Home Access: Stairs to enter Entrance Stairs-Rails: None Entrance Stairs-Number of Steps: 1 Home Layout: Two level;Bed/bath upstairs Home Equipment: Walker - 2 wheels;Cane - single point;Wheelchair - manual      Prior Function Level of Independence: Independent               Hand Dominance   Dominant Hand: Right    Extremity/Trunk Assessment   Upper Extremity Assessment: Defer to OT evaluation           Lower Extremity Assessment: Generalized weakness      Cervical / Trunk Assessment: Normal  Communication   Communication: No difficulties  Cognition Arousal/Alertness: Awake/alert Behavior During Therapy: WFL for tasks assessed/performed Overall Cognitive Status: Within Functional Limits for tasks assessed                      General Comments      Exercises        Assessment/Plan    PT Assessment Patient needs continued PT services  PT Diagnosis Difficulty walking;Acute pain   PT Problem List Decreased strength;Decreased activity tolerance;Decreased balance;Decreased mobility;Decreased safety awareness;Pain  PT Treatment Interventions DME instruction;Gait training;Stair training;Functional mobility training;Therapeutic activities;Therapeutic exercise;Balance training;Patient/family education   PT  Goals (Current goals can be found in the Care Plan section) Acute Rehab PT Goals Patient Stated Goal: to go home PT Goal Formulation: With patient/family Time For Goal Achievement: 09/05/14 Potential to Achieve Goals: Good    Frequency Min 5X/week   Barriers to discharge        Co-evaluation               End of Session Equipment Utilized During Treatment: Gait belt;Back  brace;Oxygen Activity Tolerance: Patient tolerated treatment well;Patient limited by pain Patient left: in chair;with call bell/phone within reach;with family/visitor present Nurse Communication: Mobility status         Time: 6222-9798 PT Time Calculation (min) (ACUTE ONLY): 18 min   Charges:   PT Evaluation $Initial PT Evaluation Tier I: 1 Procedure     PT G CodesDuncan Dull Sep 15, 2014, 12:12 PM Alben Deeds, [place] DPT  [phone]

## 2014-08-22 NOTE — Progress Notes (Signed)
PULMONARY  / CRITICAL CARE MEDICINE CONSULTATION   Name: Richard Wallace MRN: [id number] DOB: [birth year]    ADMISSION DATE:  08/21/2014 CONSULTATION DATE: 08/21/2014  REQUESTING CLINICIAN: Dr. Christella Noa PRIMARY SERVICE: [facility]  CHIEF COMPLAINT:  S/p Lumbar spinal fusion  BRIEF PATIENT DESCRIPTION: [name] is a 32 M with CKD, HTN who underwent spinal laminectomy and fusion. Patient transferred from OR intubated for unclear reasons. PCCM consulted to [place].  SIGNIFICANT EVENTS / STUDIES:  Posterior laminectomy and fusion 4/15  LINES / TUBES: PIV x 2 A-line 4/15 Foley 4/15  CULTURES: None  ANTIBIOTICS: None  SUBJECTIVE:   VITAL SIGNS: Temp:  [99 F (37.2 C)-100.6 F (38.1 C)] 99.4 F (37.4 C) (04/16 0800) Pulse Rate:  [75-115] 104 (04/16 1000) Resp:  [0-30] 30 (04/16 1000) BP: (74-130)/(41-111) 123/66 mmHg (04/16 1000) SpO2:  [95 %-100 %] 100 % (04/16 1000) FiO2 (%):  [50 %] 50 % (04/15 2108) HEMODYNAMICS:   VENTILATOR SETTINGS: Vent Mode:  [-] PSV;CPAP FiO2 (%):  [50 %] 50 % Set Rate:  [12 bmp] 12 bmp Vt Set:  [580 mL] 580 mL PEEP:  [5 cmH20] 5 cmH20 Pressure Support:  [5 cmH20] 5 cmH20 Plateau Pressure:  [9 cmH20-18 cmH20] 18 cmH20 INTAKE / OUTPUT: Intake/Output      04/15 0701 - 04/16 0700 04/16 0701 - 04/17 0700   I.V. (mL/kg) 6440.4 (63.1) 240 (2.4)   Blood 580    IV Piggyback 500    Total Intake(mL/kg) 7520.4 (73.7) 240 (2.4)   Urine (mL/kg/hr) 3750 (1.5) 350 (0.9)   Blood 1200 (0.5)    Total Output 4950 350   Net +2570.4 -110          PHYSICAL EXAMINATION: General:  Elderly M in NAD Neuro: Unable to assess   HEENT:  Sclera anicteric, conjunctiva pink, MMM  Neck: Trachea supple and midline, (-) LAN or JVD Cardiovascular:  RRR, nS1/S2, (-) MRG Lungs:  CTAB Abdomen:  S/NT/ND/(+)BS Musculoskeletal:  (-) C/C/E Skin:  Intact  LABS:  CBC No results for input(s): WBC, HGB, HCT, PLT in the last 168 hours. Coag's No results for input(s): APTT,  INR in the last 168 hours. BMET No results for input(s): NA, K, CL, CO2, BUN, CREATININE, GLUCOSE in the last 168 hours. Electrolytes No results for input(s): CALCIUM, MG, PHOS in the last 168 hours. Sepsis Markers No results for input(s): LATICACIDVEN, PROCALCITON, O2SATVEN in the last 168 hours. ABG  Recent Labs Lab 08/21/14 2148  PHART 7.369  PCO2ART 37.7  PO2ART 97.6   Liver Enzymes No results for input(s): AST, ALT, ALKPHOS, BILITOT, ALBUMIN in the last 168 hours. Cardiac Enzymes No results for input(s): TROPONINI, PROBNP in the last 168 hours. Glucose No results for input(s): GLUCAP in the last 168 hours.  Imaging Dg Lumbar Spine 2-3 Views  08/21/2014   CLINICAL DATA:  Lumbar fusion. Intraoperative fluoroscopic spot views.  EXAM: LUMBAR SPINE - 2-3 VIEW; DG C-ARM GT 120 MIN  COMPARISON:  Plain films lumbar spine 07/14/2014.  FINDINGS: We are provided with 2 intraoperative fluoroscopic spot views lumbar spine. Images demonstrate pedicle screws from L2-L5. Interbody spacers are in place at L2-3 and L3-4.  IMPRESSION: L2-5 fusion in progress.   Electronically Signed   By: Inge Rise M.D.   On: 08/21/2014 16:48   Dg C-arm Gt 120 Min  08/21/2014   CLINICAL DATA:  Lumbar fusion. Intraoperative fluoroscopic spot views.  EXAM: LUMBAR SPINE - 2-3 VIEW; DG C-ARM GT 120 MIN  COMPARISON:  Plain films  lumbar spine 07/14/2014.  FINDINGS: We are provided with 2 intraoperative fluoroscopic spot views lumbar spine. Images demonstrate pedicle screws from L2-L5. Interbody spacers are in place at L2-3 and L3-4.  IMPRESSION: L2-5 fusion in progress.   Electronically Signed   By: Inge Rise M.D.   On: 08/21/2014 16:48    EKG: None CXR: None  ASSESSMENT / PLAN:  PULMONARY A: Mechanical Ventilation: Unclear why patient was left on mechanical ventilation. Requires propofol to tolerate vent and it makes him hypotensive. I discussed my concerns about continuing mechanical ventilation  with Dr. Saintclair Halsted of neurosurgery. He agrees that extubation is reasonable.  I reviewed x-ray, atelectasis at the bases. P:   Extubated and doing well. Remains hypoxemic, will supplement O2. Atelectasis IS per RT given the use of back brace and inability to move.  CARDIOVASCULAR A: Hypotension: Per RN likely 2/2 propofol.  Hypertension: P:   KVO IVF Lighten sedation with goal of extubation Hold OP anti-hypertives Remove a-line.  RENAL A: CKD Hypokalemia P:   Recheck BMET Replace K. Remove foley.  GASTROINTESTINAL A: GERD:  P:   Cont Pantoprazole Liberalize diet.  HEMATOLOGIC A: No acute issues   INFECTIOUS A: No acute issues P:   Perioperative abx per [facility]  ENDOCRINE A: No acute issues  NEUROLOGIC A: S/P Decompression:  P:   Management per [facility] Add percocet for pain control  Rheumatic: A: Gout: P: Cont Allopurinol  TODAY'S SUMMARY: 69 year old male with chronic back pain s/p spinal laminectomy awaiting back brace and rehab.  Pain medications ordered, BMET and CBC ordered for today.  PCCM will sign off, please call back if needed.  Rush Farmer, M.D. [facility] Pulmonary/Critical Care Medicine. Pager: [phone]. After hours pager: [phone].  08/22/2014, 10:44 AM

## 2014-08-22 NOTE — Progress Notes (Signed)
Subjective: Patient reports Doing well no leg pain condition back soreness  Objective: Vital signs in last 24 hours: Temp:  [99 F (37.2 C)-100.6 F (38.1 C)] 99.6 F (37.6 C) (04/16 0354) Pulse Rate:  [75-115] 101 (04/16 0600) Resp:  [0-24] 10 (04/16 0600) BP: (74-128)/(41-111) 123/62 mmHg (04/16 0600) SpO2:  [95 %-100 %] 98 % (04/16 0600) FiO2 (%):  [50 %] 50 % (04/15 2108)  Intake/Output from previous day: 04/15 0701 - 04/16 0700 In: 7440.4 [I.V.:6360.4; Blood:580; IV [id number]] Out: 8299 [Urine:3750; Blood:1200] Intake/Output this shift: Total I/O In: 1010.4 [I.V.:1010.4] Out: 1950 [Urine:1950]  Strength 5 out of 5 wound clean dry and intact  Lab Results: No results for input(s): WBC, HGB, HCT, PLT in the last 72 hours. BMET No results for input(s): NA, K, CL, CO2, GLUCOSE, BUN, CREATININE, CALCIUM in the last 72 hours.  Studies/Results: Dg Lumbar Spine 2-3 Views  08/21/2014   CLINICAL DATA:  Lumbar fusion. Intraoperative fluoroscopic spot views.  EXAM: LUMBAR SPINE - 2-3 VIEW; DG C-ARM GT 120 MIN  COMPARISON:  Plain films lumbar spine 07/14/2014.  FINDINGS: We are provided with 2 intraoperative fluoroscopic spot views lumbar spine. Images demonstrate pedicle screws from L2-L5. Interbody spacers are in place at L2-3 and L3-4.  IMPRESSION: L2-5 fusion in progress.   Electronically Signed   By: Inge Rise M.D.   On: 08/21/2014 16:48   Dg C-arm Gt 120 Min  08/21/2014   CLINICAL DATA:  Lumbar fusion. Intraoperative fluoroscopic spot views.  EXAM: LUMBAR SPINE - 2-3 VIEW; DG C-ARM GT 120 MIN  COMPARISON:  Plain films lumbar spine 07/14/2014.  FINDINGS: We are provided with 2 intraoperative fluoroscopic spot views lumbar spine. Images demonstrate pedicle screws from L2-L5. Interbody spacers are in place at L2-3 and L3-4.  IMPRESSION: L2-5 fusion in progress.   Electronically Signed   By: Inge Rise M.D.   On: 08/21/2014 16:48    Assessment/Plan: Mobilized today  with physical and occupational therapy  LOS: 1 day     Richard Wallace P 08/22/2014, 6:14 AM

## 2014-08-22 NOTE — Progress Notes (Signed)
PT Cancellation Note  Patient Details Name: Ciro Tashiro MRN: [id number] DOB: 1946-02-19   Cancelled Treatment:    Reason Eval/Treat Not Completed: Medical issues which prohibited therapy (spoke with nsg, no brace as of yet, hold therapy) Nsg agreeable to OOB to chair with brace upon arrival, Therapy to evaluate next session.   Duncan Dull 08/22/2014, 11:14 AM Alben Deeds, PT DPT  [phone]

## 2014-08-22 NOTE — Progress Notes (Signed)
Orthopedic Tech Progress Note Patient Details:  Richard Wallace [birth year] [id number]  Patient ID: [name], male   DOB: 1946-01-21, 69 y.o.   MRN: [id number] Called in bio-tech brace order; spoke with Harl Favor, Sitlaly Gudiel 08/22/2014, 10:01 AM

## 2014-08-23 LAB — CBC
HCT: 36.3 % — ABNORMAL LOW (ref 39.0–52.0)
Hemoglobin: 11.8 g/dL — ABNORMAL LOW (ref 13.0–17.0)
MCH: 29.4 pg (ref 26.0–34.0)
MCHC: 32.5 g/dL (ref 30.0–36.0)
MCV: 90.5 fL (ref 78.0–100.0)
Platelets: 179 10*3/uL (ref 150–400)
RBC: 4.01 MIL/uL — ABNORMAL LOW (ref 4.22–5.81)
RDW: 14.6 % (ref 11.5–15.5)
WBC: 15.5 10*3/uL — ABNORMAL HIGH (ref 4.0–10.5)

## 2014-08-23 LAB — BASIC METABOLIC PANEL
Anion gap: 7 (ref 5–15)
BUN: 19 mg/dL (ref 6–23)
CO2: 25 mmol/L (ref 19–32)
Calcium: 8.1 mg/dL — ABNORMAL LOW (ref 8.4–10.5)
Chloride: 103 mmol/L (ref 96–112)
Creatinine, Ser: 1.19 mg/dL (ref 0.50–1.35)
GFR calc Af Amer: 70 mL/min — ABNORMAL LOW (ref >90)
GFR calc non Af Amer: 61 mL/min — ABNORMAL LOW (ref >90)
Glucose, Bld: 128 mg/dL — ABNORMAL HIGH (ref 70–99)
Potassium: 4.2 mmol/L (ref 3.5–5.1)
Sodium: 135 mmol/L (ref 135–145)

## 2014-08-23 LAB — PHOSPHORUS: Phosphorus: 1.7 mg/dL — ABNORMAL LOW (ref 2.3–4.6)

## 2014-08-23 LAB — MAGNESIUM: Magnesium: 1.7 mg/dL (ref 1.5–2.5)

## 2014-08-23 NOTE — Progress Notes (Signed)
Physical Therapy Treatment Patient Details Name: Richard Wallace MRN: [id number] DOB: 02/06/1946 Today's Date: 08/23/2014    History of Present Illness [name] is a 50 M with CKD, HTN who underwent spinal laminectomy and fusionMr. Richard Wallace is a 81 M with CKD, HTN who underwent spinal laminectomy and fusion.     PT Comments    Noting good progress with progressive amb; Continued heavy reliance on UE support on RW  Follow Up Recommendations  Home health PT;Supervision/Assistance - 24 hour     Equipment Recommendations  None recommended by PT    Recommendations for Other Services       Precautions / Restrictions Precautions Precautions: Back Precaution Comments: verbally educated patient and spouse regarding precautions Required Braces or Orthoses: Spinal Brace Spinal Brace: Lumbar corset Restrictions Weight Bearing Restrictions: No    Mobility  Bed Mobility                  Transfers Overall transfer level: Needs assistance Equipment used: Rolling walker (2 wheeled) Transfers: Sit to/from Stand Sit to Stand: Min assist         General transfer comment: VCs for hand placement and safety  Ambulation/Gait Ambulation/Gait assistance: Min guard;Min assist Ambulation Distance (Feet): 120 Feet Assistive device: Rolling walker (2 wheeled)     Gait velocity interpretation: Below normal speed for age/gender General Gait Details: Cues to self-monitor for activity tolerance; Slow moving with heavy reliance on UE support form RW; Cues to squeeze gluteals for full hip extension   Stairs            Wheelchair Mobility    Modified Rankin (Stroke Patients Only)       Balance Overall balance assessment: Needs assistance         Standing balance support: Single extremity supported;Bilateral upper extremity supported Standing balance-Leahy Scale: Poor                      Cognition Arousal/Alertness: Awake/alert Behavior During Therapy: WFL  for tasks assessed/performed Overall Cognitive Status: Within Functional Limits for tasks assessed (somewhat slow to answer questions)                      Exercises      General Comments General comments (skin integrity, edema, etc.): Attempting to have BM in bathroom without success; RN aware      Pertinent Vitals/Pain Pain Assessment: 0-10 Pain Score: 5  Pain Location: back Pain Descriptors / Indicators: Aching Pain Intervention(s): Monitored during session;Repositioned;Patient requesting pain meds-RN notified    Home Living                      Prior Function            PT Goals (current goals can now be found in the care plan section) Acute Rehab PT Goals Patient Stated Goal: to go home PT Goal Formulation: With patient/family Time For Goal Achievement: 09/05/14 Potential to Achieve Goals: Good Progress towards PT goals: Progressing toward goals    Frequency  Min 5X/week    PT Plan Current plan remains appropriate    Co-evaluation             End of Session Equipment Utilized During Treatment: Back brace Activity Tolerance: Patient tolerated treatment well Patient left: in chair;with call bell/phone within reach;with family/visitor present     Time: 1455-1514 PT Time Calculation (min) (ACUTE ONLY): 19 min  Charges:  $Gait Training: 8-22 mins  G Codes:      Roney Marion [facility] 08/23/2014, 3:32 PM  Roney Marion, [place] Pager [phone] Office [phone]

## 2014-08-23 NOTE — Evaluation (Signed)
Occupational Therapy Evaluation Patient Details Name: Richard Wallace MRN: [id number] DOB: 02-05-1946 Today's Date: 08/23/2014    History of Present Illness [name] is a 69 M with CKD, HTN who underwent spinal laminectomy and fusionMr. Richard Wallace is a 63 M with CKD, HTN who underwent spinal laminectomy and fusion.    Clinical Impression   PTA pt lived at home and was independent with ADLs. Pt is limited by decreased ROM and pain and requires assist for ADLs and min A for functional mobility. Pt will benefit from acute OT to promote independence with ADLs.     Follow Up Recommendations  No OT follow up;Supervision/Assistance - 24 hour    Equipment Recommendations  None recommended by OT (family has DME)    Recommendations for Other Services       Precautions / Restrictions Precautions Precautions: Back Precaution Comments: verbally educated patient and spouse regarding precautions Required Braces or Orthoses: Spinal Brace Spinal Brace: Lumbar corset Restrictions Weight Bearing Restrictions: No      Mobility Bed Mobility Overal bed mobility: Needs Assistance Bed Mobility: Rolling;Sit to Sidelying Rolling: Min assist       Sit to sidelying: Min assist General bed mobility comments: Min A to return LEs to bed.  Min A to help roll as a log. VC's for sequencing  Transfers Overall transfer level: Needs assistance Equipment used: Rolling walker (2 wheeled) Transfers: Sit to/from Stand Sit to Stand: Min assist         General transfer comment: VCs for hand placement and safety    Balance Overall balance assessment: Needs assistance         Standing balance support: Single extremity supported;Bilateral upper extremity supported Standing balance-Leahy Scale: Poor                              ADL Overall ADL's : Needs assistance/impaired Eating/Feeding: Independent;Sitting   Grooming: Set up;Sitting   Upper Body Bathing: Set up;Sitting   Lower  Body Bathing: Minimal assistance;Sit to/from stand   Upper Body Dressing : Minimal assistance;Sitting Upper Body Dressing Details (indicate cue type and reason): including back brace Lower Body Dressing: Moderate assistance;Sit to/from stand   Toilet Transfer: Minimal assistance;Ambulation;RW     Toileting - Clothing Manipulation Details (indicate cue type and reason): educated on use of tongs and baby wipes for pericare     Functional mobility during ADLs: Min guard;Rolling walker General ADL Comments: Pt relying heavily on RW during mobility. Min A to stand. Wife is an OT and can assist. Good adherence to back precautions. Difficulty with bed mobility.     Vision Additional Comments: No apparent deficits          Pertinent Vitals/Pain Pain Assessment: 0-10 Pain Score: 5  Pain Location: back Pain Descriptors / Indicators: Aching Pain Intervention(s): Limited activity within patient's tolerance;Monitored during session;Repositioned     Hand Dominance Right   Extremity/Trunk Assessment Upper Extremity Assessment Upper Extremity Assessment: Overall WFL for tasks assessed   Lower Extremity Assessment Lower Extremity Assessment: Defer to PT evaluation;Generalized weakness   Cervical / Trunk Assessment Cervical / Trunk Assessment: Normal   Communication Communication Communication: No difficulties   Cognition Arousal/Alertness: Awake/alert Behavior During Therapy: WFL for tasks assessed/performed Overall Cognitive Status: Within Functional Limits for tasks assessed  Home Living Family/patient expects to be discharged to:: Private residence Living Arrangements: Spouse/significant other Available Help at Discharge: Family Type of Home: House Home Access: Stairs to enter Technical brewer of Steps: 1 Entrance Stairs-Rails: None Home Layout: Two level;Bed/bath upstairs Alternate Level Stairs-Number of Steps: 12 Alternate  Level Stairs-Rails: Can reach both Bathroom Shower/Tub: Tub/shower unit;Walk-in shower (walk-in downstairs) Shower/tub characteristics: Door Bathroom Toilet: Standard     Home Equipment: Environmental consultant - 2 wheels;Cane - single point;Wheelchair - [employer];Tub bench   Additional Comments: Pt's wife is an OT      Prior Functioning/Environment Level of Independence: Independent             OT Diagnosis: Generalized weakness;Acute pain   OT Problem List: Decreased strength;Decreased range of motion;Decreased activity tolerance;Impaired balance (sitting and/or standing);Pain   OT Treatment/Interventions: Self-care/ADL training;Therapeutic exercise;Energy conservation;DME and/or AE instruction;Therapeutic activities;Patient/family education;Balance training    OT Goals(Current goals can be found in the care plan section) Acute Rehab OT Goals Patient Stated Goal: to go home OT Goal Formulation: With patient Time For Goal Achievement: 09/06/14 Potential to Achieve Goals: Good ADL Goals Pt Will Perform Grooming: with supervision;standing Pt Will Transfer to Toilet: with supervision;ambulating Pt Will Perform Toileting - Clothing Manipulation and hygiene: with supervision;sit to/from stand;with adaptive equipment Additional ADL Goal #1: Pt will perform bed mobility with Supervision using log roll technique to prepare for ADLs.   OT Frequency: Min 2X/week    End of Session Equipment Utilized During Treatment: Gait belt;Rolling walker;Back brace Nurse Communication: Mobility status  Activity Tolerance: Patient tolerated treatment well Patient left: in bed;with call bell/phone within reach;with family/visitor present   Time: 8338-2505 OT Time Calculation (min): 32 min Charges:  OT General Charges $OT Visit: 1 Procedure OT Evaluation $Initial OT Evaluation Tier I: 1 Procedure OT Treatments $Self Care/Home Management : 8-22 mins G-Codes:    Juluis Rainier Sep 01, 2014, 6:58  PM  Secundino Ginger Lynetta Mare, OTR/L Occupational Therapist [phone] (pager)

## 2014-08-23 NOTE — Progress Notes (Signed)
Subjective: Patient reports Doing well no leg pain condition back soreness  Objective: Vital signs in last 24 hours: Temp:  [98.6 F (37 C)-99.8 F (37.7 C)] 98.7 F (37.1 C) (04/17 0725) Pulse Rate:  [92-119] 112 (04/17 0800) Resp:  [13-30] 16 (04/17 0800) BP: (100-135)/(48-76) 119/74 mmHg (04/17 0800) SpO2:  [89 %-100 %] 94 % (04/17 0800)  Intake/Output from previous day: 04/16 0701 - 04/17 0700 In: 1200 [I.V.:1200] Out: 3325 [Urine:3325] Intake/Output this shift:    Strength 5 out of 5 wound clean dry and intact  Lab Results:  Recent Labs  08/22/14 1130 08/23/14 0227  WBC 9.1 15.5*  HGB 8.7* 11.8*  HCT 32.3* 36.3*  PLT 148* 179   BMET  Recent Labs  08/22/14 1130 08/23/14 0227  NA 136 135  K 4.5 4.2  CL 105 103  CO2 23 25  GLUCOSE 113* 128*  BUN 19 19  CREATININE 1.17 1.19  CALCIUM 8.0* 8.1*    Studies/Results: Dg Lumbar Spine 2-3 Views  08/21/2014   CLINICAL DATA:  Lumbar fusion. Intraoperative fluoroscopic spot views.  EXAM: LUMBAR SPINE - 2-3 VIEW; DG C-ARM GT 120 MIN  COMPARISON:  Plain films lumbar spine 07/14/2014.  FINDINGS: We are provided with 2 intraoperative fluoroscopic spot views lumbar spine. Images demonstrate pedicle screws from L2-L5. Interbody spacers are in place at L2-3 and L3-4.  IMPRESSION: L2-5 fusion in progress.   Electronically Signed   By: Inge Rise M.D.   On: 08/21/2014 16:48   Dg C-arm Gt 120 Min  08/21/2014   CLINICAL DATA:  Lumbar fusion. Intraoperative fluoroscopic spot views.  EXAM: LUMBAR SPINE - 2-3 VIEW; DG C-ARM GT 120 MIN  COMPARISON:  Plain films lumbar spine 07/14/2014.  FINDINGS: We are provided with 2 intraoperative fluoroscopic spot views lumbar spine. Images demonstrate pedicle screws from L2-L5. Interbody spacers are in place at L2-3 and L3-4.  IMPRESSION: L2-5 fusion in progress.   Electronically Signed   By: Inge Rise M.D.   On: 08/21/2014 16:48    Assessment/Plan: Doing well continue physical  and occupational therapy transfer to the floor  LOS: 2 days     Richard Wallace P 08/23/2014, 8:10 AM

## 2014-08-24 LAB — URINALYSIS, ROUTINE W REFLEX MICROSCOPIC
Bilirubin Urine: NEGATIVE
Glucose, UA: NEGATIVE mg/dL
Ketones, ur: NEGATIVE mg/dL
Nitrite: NEGATIVE
Protein, ur: 30 mg/dL — AB
Specific Gravity, Urine: 1.016 (ref 1.005–1.030)
Urobilinogen, UA: 1 mg/dL (ref 0.0–1.0)
pH: 8 (ref 5.0–8.0)

## 2014-08-24 LAB — URINE MICROSCOPIC-ADD ON

## 2014-08-24 MED ORDER — BISACODYL 10 MG RE SUPP
10.0000 mg | Freq: Every day | RECTAL | Status: DC | PRN
  Administered 2014-08-24: 10 mg via RECTAL
  Filled 2014-08-24: qty 1

## 2014-08-24 MED FILL — Sodium Chloride IV Soln 0.9%: INTRAVENOUS | Qty: 3000 | Status: AC

## 2014-08-24 MED FILL — Heparin Sodium (Porcine) Inj 1000 Unit/ML: INTRAMUSCULAR | Qty: 30 | Status: AC

## 2014-08-24 NOTE — Progress Notes (Signed)
Occupational Therapy Treatment Patient Details Name: Richard Wallace MRN: [id number] DOB: [birth year] Today's Date: 08/24/2014    History of present illness Richard Wallace is a 23 M with CKD, HTN who underwent spinal laminectomy and fusionMr. Richard Wallace is a 60 M with CKD, HTN who underwent spinal laminectomy and fusion.    OT comments  Pt completed bed mobility with mod (A) to (A) BIL LE and incr time to complete chair transfer. Pt requires heavy use of BIL UE on RW with mobility with more than 4 steps to turn and below average gait velocity. Pt demonstrates fall risk with RW. Pt's wife is an OT per previous session notes. Pt positioned in supine and educated on change of position every 45 minutes.   Follow Up Recommendations  No OT follow up;Supervision/Assistance - 24 hour    Equipment Recommendations  None recommended by OT    Recommendations for Other Services      Precautions / Restrictions Precautions Precautions: Back Precaution Comments: Able to independently verbalize 3/3 back precautions. Required Braces or Orthoses: Spinal Brace Spinal Brace: Lumbar corset Restrictions Weight Bearing Restrictions: No       Mobility Bed Mobility Overal bed mobility: Needs Assistance Bed Mobility: Sit to Supine Rolling: Min assist Sidelying to sit: Mod assist   Sit to supine: Mod assist   General bed mobility comments: (A) with BIL LE to complete transfer  Transfers Overall transfer level: Needs assistance Equipment used: Rolling walker (2 wheeled) Transfers: Sit to/from Stand Sit to Stand: Min guard         General transfer comment: pt with heavy use of BIL UE on arm rest to power up into standing. Pt with heavy use of BIL UE on RW    Balance Overall balance assessment: Needs assistance Sitting-balance support: No upper extremity supported;Feet supported Sitting balance-Leahy Scale: Fair Sitting balance - Comments: Pt with LOB to the right sitting EOB when trying to donn gown  and LSO. Min A to maintain balance.    Standing balance support: During functional activity Standing balance-Leahy Scale: Poor                     ADL Overall ADL's : Needs assistance/impaired                                     Functional mobility during ADLs: Min guard;Rolling walker General ADL Comments: Pt agreeable to short ambulation and then practicing bed mobility. pt ambulated ~ 100 ft as precursor to pending bowel movement to help with facilitating proper digestive process. Pt has had multiple medications/ foods to help with bowel movement. Pt educated on sit<>S upine and proper position supine. pt provided heated blanket. Pt educated about positioning for sleeping.  Educated on doff back brace ( keep straps atached at all times) Pt able to recall 2 out 3 back precautions ( missing twisting)      Vision                     Perception     Praxis      Cognition   Behavior During Therapy: [name] for tasks assessed/performed Overall Cognitive Status: Within Functional Limits for tasks assessed       Memory: Decreased short-term memory               Extremity/Trunk Assessment  Exercises     Shoulder Instructions       General Comments      Pertinent Vitals/ Pain       Pain Assessment: Faces Pain Score: 8  Faces Pain Scale: Hurts even more Pain Location: stomach discomfort due to no bowel movement Pain Descriptors / Indicators: Sore Pain Intervention(s): Premedicated before session;Monitored during session  Home Living                                          Prior Functioning/Environment              Frequency Min 2X/week     Progress Toward Goals  OT Goals(current goals can now be found in the care plan section)  Progress towards OT goals: Progressing toward goals  Acute Rehab OT Goals Patient Stated Goal: to go home OT Goal Formulation: With patient Time For Goal  Achievement: 09/06/14 Potential to Achieve Goals: Good ADL Goals Pt Will Perform Grooming: with supervision;standing Pt Will Transfer to Toilet: with supervision;ambulating Pt Will Perform Toileting - Clothing Manipulation and hygiene: with supervision;sit to/from stand;with adaptive equipment Additional ADL Goal #1: Pt will perform bed mobility with Supervision using log roll technique to prepare for ADLs.   Plan Discharge plan remains appropriate    Co-evaluation                 End of Session Equipment Utilized During Treatment: Back brace;Gait belt;Rolling walker   Activity Tolerance Patient tolerated treatment well   Patient Left in bed;with call bell/phone within reach;with family/visitor present   Nurse Communication Mobility status;Precautions        Time: 5102-5852 OT Time Calculation (min): 19 min  Charges: OT General Charges $OT Visit: 1 Procedure OT Treatments $Self Care/Home Management : 8-22 mins  Parke Poisson B 08/24/2014, 2:10 PM Pager: [phone]

## 2014-08-24 NOTE — Progress Notes (Signed)
Occupational Therapy Treatment Patient Details Name: Richard Wallace MRN: [id number] DOB: 1946/03/04 Today's Date: 08/24/2014    History of present illness [name] is a 13 M with CKD, HTN who underwent spinal laminectomy and fusionMr. Richard Wallace is a 89 M with CKD, HTN who underwent spinal laminectomy and fusion.    OT comments  OT reviewing back precautions for adls in detail with handout and teach back method this session. Pt declined OT mobility for 15 minutes. Ot used this time to discuss ADLS and back precaution safety. Pt and wife asking questions. Educated on pain management with establishing a set schedule for medication at night.    Follow Up Recommendations  No OT follow up;Supervision/Assistance - 24 hour    Equipment Recommendations  None recommended by OT    Recommendations for Other Services      Precautions / Restrictions Precautions Precautions: Back Precaution Comments: Able to independently verbalize 3/3 back precautions. Required Braces or Orthoses: Spinal Brace Spinal Brace: Lumbar corset Restrictions Weight Bearing Restrictions: No       Mobility Bed Mobility   Balance                   ADL Overall ADL's : Needs assistance/impaired                                       General ADL Comments: Pt in chair on arrival and requesting therapist return for any moblity. OT provided handout for back precautions and reviewed infomration with patient in detail. Pt prior to session unable to recall any precautions. pt reports stomach discomfort due to inability to void bowels and having just attempted sitting in bathroom on toilet. Pt agreeable to moblity upon OT return      Vision                     Perception     Praxis      Cognition   Behavior During Therapy: [facility] for tasks assessed/performed Overall Cognitive Status: Within Functional Limits for tasks assessed       Memory: Decreased short-term memory               Extremity/Trunk Assessment               Exercises     Shoulder Instructions       General Comments      Pertinent Vitals/ Pain       Pain Assessment: Faces Pain Score: 8  Faces Pain Scale: Hurts even more Pain Location: stomach discomfort due to no bowel movement Pain Descriptors / Indicators: Sore Pain Intervention(s): Premedicated before session;Monitored during session  Home Living                                          Prior Functioning/Environment              Frequency Min 2X/week     Progress Toward Goals  OT Goals(current goals can now be found in the care plan section)  Progress towards OT goals: Progressing toward goals  Acute Rehab OT Goals Patient Stated Goal: to go home OT Goal Formulation: With patient Time For Goal Achievement: 09/06/14 Potential to Achieve Goals: Good ADL Goals Pt Will Perform Grooming: with supervision;standing Pt Will Transfer to Toilet:  with supervision;ambulating Pt Will Perform Toileting - Clothing Manipulation and hygiene: with supervision;sit to/from stand;with adaptive equipment Additional ADL Goal #1: Pt will perform bed mobility with Supervision using log roll technique to prepare for ADLs.   Plan Discharge plan remains appropriate    Co-evaluation                 End of Session     Activity Tolerance Patient tolerated treatment well   Patient Left in chair;in CPM;with family/visitor present   Nurse Communication Mobility status;Precautions        Time: 4503-8882 OT Time Calculation (min): 15 min  Charges: OT General Charges $OT Visit: 1 Procedure OT Treatments $Self Care/Home Management : 8-22 mins  Parke Poisson B 08/24/2014, 2:03 PM Pager: [phone]

## 2014-08-24 NOTE — Progress Notes (Signed)
Paged neurosurgeon on call regarding recent WBC of 15.5 along with low grade temp and low O2 sats.  Advised to continue to monitor patient and page again if temperature rises.

## 2014-08-24 NOTE — Progress Notes (Addendum)
Physical Therapy Treatment Patient Details Name: Richard Wallace MRN: [id number] DOB: [birth year] Today's Date: 08/24/2014    History of Present Illness Richard Wallace is a 68 M with CKD, HTN who underwent spinal laminectomy and fusionMr. [name] is a 22 M with CKD, HTN who underwent spinal laminectomy and fusion.     PT Comments    Patient progressing slowly with mobility. Pt shivering during session today and anxious as he has not had a BM yet. Min A for balance during gait with cues to adhere to back precautions during mobility. Wife very supportive and reports she will be bringing his bed down from upstairs so pt does not have to negotiate steps at home. Goals updated. Increased difficulty standing today requiring Mod A. Will continue to follow and progress as tolerated.   Follow Up Recommendations  Home health PT;Supervision/Assistance - 24 hour     Equipment Recommendations  None recommended by PT    Recommendations for Other Services       Precautions / Restrictions Precautions Precautions: Back Precaution Comments: Able to independently verbalize 3/3 back precautions. Required Braces or Orthoses: Spinal Brace Spinal Brace: Lumbar corset Restrictions Weight Bearing Restrictions: No    Mobility  Bed Mobility Overal bed mobility: Needs Assistance Bed Mobility: Rolling;Sidelying to Sit Rolling: Min assist Sidelying to sit: Mod assist       General bed mobility comments: Min A to help log roll. VC"s for sequencing and increased time to perform.  Mod A to elevate trunk. Removed rail to simulate home environment.  Transfers Overall transfer level: Needs assistance Equipment used: Rolling walker (2 wheeled) Transfers: Sit to/from Stand Sit to Stand: Mod assist         General transfer comment: Mod A to rise from EOB with cues for anterior translation and hand placement. Unable to stand on own even with cues, reaching up to pull on RW.  Ambulation/Gait Ambulation/Gait  assistance: Min assist Ambulation Distance (Feet): 75 Feet Assistive device: Rolling walker (2 wheeled) Gait Pattern/deviations: Step-to pattern;Decreased stride length;Trunk flexed   Gait velocity interpretation: Below normal speed for age/gender General Gait Details: Pt with step through gait progressing to step to gait towards end of distance. Heavily relying on UE support from RW. Cues for upright posture. Min A due to balance deficits.    Stairs            Wheelchair Mobility    Modified Rankin (Stroke Patients Only)       Balance Overall balance assessment: Needs assistance Sitting-balance support: Feet supported;No upper extremity supported Sitting balance-Leahy Scale: Fair Sitting balance - Comments: Pt with LOB to the right sitting EOB when trying to donn gown and LSO. Min A to maintain balance.    Standing balance support: During functional activity Standing balance-Leahy Scale: Poor                      Cognition Arousal/Alertness: Awake/alert Behavior During Therapy: WFL for tasks assessed/performed Overall Cognitive Status: Within Functional Limits for tasks assessed                      Exercises      General Comments        Pertinent Vitals/Pain Pain Assessment: 0-10 Pain Score: 8  Pain Location: back Pain Descriptors / Indicators: Sore;Aching Pain Intervention(s): Limited activity within patient's tolerance;Monitored during session;Repositioned    Home Living  Prior Function            PT Goals (current goals can now be found in the care plan section) Progress towards PT goals: Progressing toward goals    Frequency  Min 5X/week    PT Plan Current plan remains appropriate    Co-evaluation             End of Session Equipment Utilized During Treatment: Gait belt;Back brace Activity Tolerance: Patient limited by fatigue;Patient limited by pain Patient left: in chair;with call  bell/phone within reach;with chair alarm set;with family/visitor present     Time: 1224-4975 PT Time Calculation (min) (ACUTE ONLY): 26 min  Charges:  $Gait Training: 8-22 mins $Therapeutic Activity: 8-22 mins                    G CodesCandy Sledge A 08/26/2014, 12:05 PM Candy Sledge, [place], DPT [phone]

## 2014-08-24 NOTE — Clinical Documentation Improvement (Signed)
PLEASE INDICATE STAGE OF CKD: Possible Clinical Conditions?   _______CKD Stage I - GFR > OR = 90 _______CKD Stage II - GFR 60-80 _______CKD Stage III - GFR 30-59 _______CKD Stage IV - GFR 15-29 _______CKD Stage V - GFR < 15 _______ESRD (End Stage Renal Disease) _______Other condition _______Cannot Clinically determine   Supporting Information:(As per notes) Pt has CKD  Thank You, Alessandra Grout, RN, BSN, CCDS,Clinical Documentation Specialist:  [phone]  [phone]=[facility] [place]- Health Information Management

## 2014-08-24 NOTE — Progress Notes (Addendum)
Per family, patient appears not interactive today and concerns of his well being. Patient denied any pain or discomfort. He is passing more flatus, not yet able to pass stool. Denied n/v. Per family, foley was inserted from 3MW prior transfering to 4N. Foley will be d/c and urine sample will sent to lab from MD.  Patient is due to void. Will continue to monitor.   Ave Filter, RN

## 2014-08-24 NOTE — Progress Notes (Signed)
Patient ID: Richard Wallace, male   DOB: 03-Nov-1945, 69 y.o.   MRN: [id number] BP 93/61 mmHg  Pulse 92  Temp(Src) 99.2 F (37.3 C) (Oral)  Resp 18  Wt 102.059 kg (225 lb)  SpO2 96% Alert and oriented x 4, following all commands Moving lower extremities well Will order an enema tonight, constipated and feeling very uncomfortable Wound is clean, and dry.

## 2014-08-25 NOTE — Progress Notes (Signed)
Occupational Therapy Treatment Patient Details Name: Richard Wallace MRN: [id number] DOB: [birth year] Today's Date: 08/25/2014    History of present illness [name] is a 30 M with CKD, HTN who underwent spinal laminectomy and fusionMr. Richard Wallace is a 78 M with CKD, HTN who underwent spinal laminectomy and fusion.    OT comments  Pt demonstrates poor return of all back precautions this session. OT called wife and from discussion determined all dme needs will be met at home including a bed with ability to rise / lower HOB. Pt reporting confusion to wife. Pt slightly impulsive at times during session. Pt could benefit from continued OT to maintain / reinforce back precautions with ADLS. Pt will have bed on main level of home due to fall risk with RW.   RN called by OT to report wife's concerns with confusion after medications. Pt voiding bladdder / bowel this session.   Follow Up Recommendations  Home health OT;Supervision/Assistance - 24 hour    Equipment Recommendations  None recommended by OT (family has all needed DME)    Recommendations for Other Services      Precautions / Restrictions Precautions Precautions: Back Required Braces or Orthoses: Spinal Brace Spinal Brace: Lumbar corset       Mobility Bed Mobility               General bed mobility comments: in bathroom on arrival with RN   Transfers Overall transfer level: Needs assistance Equipment used: Rolling walker (2 wheeled) Transfers: Sit to/from Stand Sit to Stand: Mod assist         General transfer comment: requires heavy use of hands    Balance Overall balance assessment: Needs assistance                                 ADL Overall ADL's : Needs assistance/impaired Eating/Feeding: Independent;Sitting   Grooming: Wash/dry hands;Wash/dry face;Oral care;Moderate assistance;Cueing for safety;Standing Grooming Details (indicate cue type and reason): Pt poor return demo bending twisting  throughout task. pt not following commands to correct. Pt educated that all adls should be done in sitting due to poor return demo. Pt's wife not present at this time to discuss     Lower Body Bathing: Maximal assistance;Sit to/from stand           Toilet Transfer: Moderate assistance;Ambulation;Regular Toilet;Grab bars;RW Armed forces technical officer Details (indicate cue type and reason): Pt required heavy use of grab bars and unable to push up on bar. Pt will require 3n1 for home use Toileting- Clothing Manipulation and Hygiene: Total assistance;Sit to/from stand Toileting - Clothing Manipulation Details (indicate cue type and reason): pt unable to complete peri care at this time. Pt noted to have hemorrhoid and cream provided by wife applied to peri area.      Functional mobility during ADLs: Minimal assistance;Rolling walker General ADL Comments: Pt with brace don incorrectly on arrival ( not aligned or tight). pt assisted with max (A) to help correctly position brace. pt very flat affect during session. pt with poor return demo in bathroom with all back precautions and reports difficulty correcting with max cues. Pt's wife currently not present to educate on safety with ADLS. Pt positioned in chair for breakfast      Vision                     Perception     Praxis      Cognition  Behavior During Therapy: WFL for tasks assessed/performed Overall Cognitive Status: Impaired/Different from baseline Area of Impairment: Safety/judgement;Awareness;Memory     Memory: Decreased short-term memory    Safety/Judgement: Decreased awareness of safety     General Comments: Pt with poor recall of precautions and when OT alerting Pt to errors ( bending) pt states "I can't not do it"    Extremity/Trunk Assessment               Exercises     Shoulder Instructions       General Comments      Pertinent Vitals/ Pain       Pain Assessment: Faces Faces Pain Scale: Hurts even  more Pain Descriptors / Indicators: Sore Pain Intervention(s): Premedicated before session;Repositioned  Home Living                                          Prior Functioning/Environment              Frequency Min 2X/week     Progress Toward Goals  OT Goals(current goals can now be found in the care plan section)  Progress towards OT goals: Progressing toward goals  Acute Rehab OT Goals Patient Stated Goal: to go home OT Goal Formulation: With patient Time For Goal Achievement: 09/06/14 Potential to Achieve Goals: Good ADL Goals Pt Will Perform Grooming: with supervision;standing Pt Will Transfer to Toilet: with supervision;ambulating Pt Will Perform Toileting - Clothing Manipulation and hygiene: with supervision;sit to/from stand;with adaptive equipment Additional ADL Goal #1: Pt will perform bed mobility with Supervision using log roll technique to prepare for ADLs.   Plan Discharge plan needs to be updated    Co-evaluation                 End of Session Equipment Utilized During Treatment: Rolling walker;Back brace   Activity Tolerance Patient tolerated treatment well   Patient Left in chair;with call bell/phone within reach;with chair alarm set   Nurse Communication Precautions;Mobility status        Time: 2841-3244 (15 minutes call to wife about safety) OT Time Calculation (min): 27 min  Charges: OT General Charges $OT Visit: 1 Procedure OT Treatments $Self Care/Home Management : 23-37 mins  Parke Poisson B 08/25/2014, 8:22 AM Pager: [phone]

## 2014-08-25 NOTE — Progress Notes (Signed)
Patient ID: Richard Wallace, male   DOB: [birth year], 69 y.o.   MRN: [id number] BP 128/57 mmHg  Pulse 107  Temp(Src) 98.6 F (37 C) (Oral)  Resp 20  Ht 5\' 9"  (1.753 m)  Wt 101.8 kg (224 lb 6.9 oz)  BMI 33.13 kg/m2  SpO2 95% Alert and oriented x 4 Wound is clean, dry, no signs of infection Moving All extremities well Possible discharge tomorrow Will need home health

## 2014-08-25 NOTE — Progress Notes (Signed)
Talked to patient about [facility] choices, patient chose [facility] for home health care services; orders/ faxed to [facility] agency; 3:1 ordered and to be delivered to the room today; Mindi Slicker RN,BSN,MHA [phone]

## 2014-08-25 NOTE — Progress Notes (Signed)
Patient voided 150cc. Bladder scan show 400cc in residual. Patient denies discomfort. MD due to round.

## 2014-08-25 NOTE — Progress Notes (Signed)
Physical Therapy Treatment Patient Details Name: Richard Wallace MRN: [id number] DOB: Jul 14, 1945 Today's Date: 08/25/2014    History of Present Illness [name] is a 23 M with CKD, HTN who underwent spinal laminectomy and fusionMr. Richard Wallace is a 59 M with CKD, HTN who underwent spinal laminectomy and fusion.     PT Comments    Patient progressing well with mobility. Able to perform bed mobility with HOB elevated without physical assist today. Improved ambulation distance however continues to exhibit balance deficits esp with head turns or when distracted requiring Min A for safety. Cues to maintain back precautions during mobility. Will continue to follow and progress as tolerated.   Follow Up Recommendations  Home health PT;Supervision/Assistance - 24 hour     Equipment Recommendations  None recommended by PT    Recommendations for Other Services       Precautions / Restrictions Precautions Precautions: Back Precaution Comments: Able to independently verbalize 3/3 back precautions. Required Braces or Orthoses: Spinal Brace Spinal Brace: Lumbar corset Restrictions Weight Bearing Restrictions: No    Mobility  Bed Mobility Overal bed mobility: Needs Assistance Bed Mobility: Rolling;Sidelying to Sit Rolling: Min guard Sidelying to sit: Min guard;HOB elevated       General bed mobility comments: HOB elevated (to simulate bed at home), no use of rails. Increased time. Good demo of log roll technique.  Transfers Overall transfer level: Needs assistance Equipment used: Rolling walker (2 wheeled) Transfers: Sit to/from Stand Sit to Stand: Min assist;From elevated surface         General transfer comment: Able to stand from elevated surface without physical assist but requires assist from lower surface. Constant cues for handplacement - not able to learn hand placement within session.  Ambulation/Gait Ambulation/Gait assistance: Min assist Ambulation Distance (Feet): 250  Feet Assistive device: Rolling walker (2 wheeled) Gait Pattern/deviations: Step-through pattern;Decreased stride length;Trunk flexed;Drifts right/left   Gait velocity interpretation: Below normal speed for age/gender General Gait Details: Pt with slow and unsteady gait esp when distracted or with head movements. Cues for upright posture. Min A at times due to balance deficits.    Stairs            Wheelchair Mobility    Modified Rankin (Stroke Patients Only)       Balance Overall balance assessment: Needs assistance Sitting-balance support: Feet supported;No upper extremity supported Sitting balance-Leahy Scale: Fair     Standing balance support: During functional activity Standing balance-Leahy Scale: Poor                      Cognition Arousal/Alertness: Awake/alert Behavior During Therapy: WFL for tasks assessed/performed Overall Cognitive Status: Impaired/Different from baseline Area of Impairment: Safety/judgement     Memory: Decreased short-term memory   Safety/Judgement: Decreased awareness of safety          Exercises      General Comments        Pertinent Vitals/Pain Pain Assessment: 0-10 Pain Score: 4  Pain Location: back Pain Descriptors / Indicators: Sore Pain Intervention(s): Monitored during session;Repositioned;Premedicated before session    Home Living                      Prior Function            PT Goals (current goals can now be found in the care plan section) Progress towards PT goals: Progressing toward goals    Frequency  Min 5X/week    PT Plan Current plan  remains appropriate    Co-evaluation             End of Session Equipment Utilized During Treatment: Gait belt;Back brace Activity Tolerance: Patient tolerated treatment well;Patient limited by pain Patient left: in bed;with call bell/phone within reach;with bed alarm set;with family/visitor present     Time: 4656-8127 PT Time  Calculation (min) (ACUTE ONLY): 30 min  Charges:  $Gait Training: 8-22 mins $Therapeutic Activity: 8-22 mins                    G CodesCandy Sledge A 26-Aug-2014, 12:28 PM Candy Sledge, [place], DPT [phone]

## 2014-08-26 ENCOUNTER — Inpatient Hospital Stay (HOSPITAL_COMMUNITY): Payer: Medicare Other

## 2014-08-26 LAB — CBC WITH DIFFERENTIAL/PLATELET
Basophils Absolute: 0 10*3/uL (ref 0.0–0.1)
Basophils Relative: 0 % (ref 0–1)
Eosinophils Absolute: 0.2 10*3/uL (ref 0.0–0.7)
Eosinophils Relative: 2 % (ref 0–5)
HCT: 35.5 % — ABNORMAL LOW (ref 39.0–52.0)
Hemoglobin: 11.6 g/dL — ABNORMAL LOW (ref 13.0–17.0)
Lymphocytes Relative: 7 % — ABNORMAL LOW (ref 12–46)
Lymphs Abs: 0.8 10*3/uL (ref 0.7–4.0)
MCH: 28.9 pg (ref 26.0–34.0)
MCHC: 32.7 g/dL (ref 30.0–36.0)
MCV: 88.3 fL (ref 78.0–100.0)
Monocytes Absolute: 1.4 10*3/uL — ABNORMAL HIGH (ref 0.1–1.0)
Monocytes Relative: 13 % — ABNORMAL HIGH (ref 3–12)
Neutro Abs: 8.8 10*3/uL — ABNORMAL HIGH (ref 1.7–7.7)
Neutrophils Relative %: 78 % — ABNORMAL HIGH (ref 43–77)
Platelets: 298 10*3/uL (ref 150–400)
RBC: 4.02 MIL/uL — ABNORMAL LOW (ref 4.22–5.81)
RDW: 14.3 % (ref 11.5–15.5)
WBC: 11.3 10*3/uL — ABNORMAL HIGH (ref 4.0–10.5)

## 2014-08-26 LAB — BASIC METABOLIC PANEL
Anion gap: 11 (ref 5–15)
BUN: 24 mg/dL — ABNORMAL HIGH (ref 6–23)
CO2: 23 mmol/L (ref 19–32)
Calcium: 8.5 mg/dL (ref 8.4–10.5)
Chloride: 102 mmol/L (ref 96–112)
Creatinine, Ser: 1.43 mg/dL — ABNORMAL HIGH (ref 0.50–1.35)
GFR calc Af Amer: 56 mL/min — ABNORMAL LOW (ref >90)
GFR calc non Af Amer: 48 mL/min — ABNORMAL LOW (ref >90)
Glucose, Bld: 138 mg/dL — ABNORMAL HIGH (ref 70–99)
Potassium: 4.1 mmol/L (ref 3.5–5.1)
Sodium: 136 mmol/L (ref 135–145)

## 2014-08-26 NOTE — Progress Notes (Signed)
Patient ID: Richard Wallace, male   DOB: [birth year], 69 y.o.   MRN: [id number] BP 133/58 mmHg  Pulse 110  Temp(Src) 102.9 F (39.4 C) (Oral)  Resp 20  Ht 5\' 9"  (1.753 m)  Wt 101.8 kg (224 lb 6.9 oz)  BMI 33.13 kg/m2  SpO2 98% Alert and oriented x 4 Febrile, will send ua, cbc, bmet Very tender right knee, hurts to stand on knee.Pain was present prior to operation.  Will perform xray of knee.

## 2014-08-26 NOTE — Progress Notes (Signed)
Asked Pt about his head from the shelf falling on him on 08/22/2014, from visual assessment there is no mark or abrasion of any kind. No swelling, bruising or bleeding. Pt states that "I am perfectly fine."  Josh M, RN the charge nurse also checked back in with the patient to ensure that no further medical attention was needed in reference to the shelf and the patient again denied that he needed anything.  Monia Timmers GARNER

## 2014-08-26 NOTE — Progress Notes (Signed)
Pt was being assisted to the bathroom with student RN, Brittney on his left and his wife on his right. He has been unable to void since his catheter removal and wants to avoid having it replaced. Therefore, we have helped him make many attempts to void over the last few hours.  Pt was standing in front of the toilet trying to void, with Karsten Fells, Student RN still on his left and his wife on his right, when "the patient leaned forward to reposition and hit the rack with top of his head. Rack fell off and I caught it from causing more damage. I assessed that the patient had minor bleeding abrasion that was treated with gauze and stopped bleeding quickly." Brittney B, [employer] Student RN  This RN, Vita Barley came in to assess the pt head as well and the bleeding had stopped just moments after the shelf fell. Pt and his wife were both calm and reported being in no distress or pain. No further medical care was needed at this time.  This RN, Vita Barley, reported the incident to the charge nurse and he, Weston Brass came into the room to speak with the patient and his wife and assessed the head as well.   Pt is back in the bed and remains safe at this time.   Jorryn Hershberger GARNER

## 2014-08-26 NOTE — Progress Notes (Signed)
Occupational Therapy Treatment Patient Details Name: Richard Wallace MRN: [id number] DOB: August 06, 1945 Today's Date: 08/26/2014    History of present illness [name] is a 60 M with CKD, HTN who underwent spinal laminectomy and fusionMr. Richard Wallace is a 67 M with CKD, HTN who underwent spinal laminectomy and fusion.    OT comments  Pt with 9 out 10 R knee pain limiting progress with therapy. Pt demonstrates fall risk due to R knee pain. Per pt and wife prior to surg knee was hurting but had resolved. Pt now with extreme pain that is worse than previously. Pt reports hx of injury to R knee and no hx of gout.  Pt and wife have w/c and OT educated on use within home for d/c. RN Manus Gunning notified of R knee pain and fall risk   Follow Up Recommendations  Home health OT;Supervision/Assistance - 24 hour    Equipment Recommendations  None recommended by OT    Recommendations for Other Services      Precautions / Restrictions Precautions Precautions: Back Precaution Booklet Issued: No Precaution Comments: Able to independently verbalize 3/3 back precautions. Required Braces or Orthoses: Spinal Brace Spinal Brace: Lumbar corset Restrictions Weight Bearing Restrictions: No       Mobility Bed Mobility Overal bed mobility: Needs Assistance Bed Mobility: Supine to Sit;Sit to Supine Rolling: Min assist Sidelying to sit: Min assist Supine to sit: Min assist     General bed mobility comments: from wife with HOB flat. Pt requires wife (A)   Transfers Overall transfer level: Needs assistance Equipment used: Rolling walker (2 wheeled) Transfers: Sit to/from Stand Sit to Stand: Min guard;From elevated surface         General transfer comment: pt and wife educated on elevating surface height    Balance Overall balance assessment: Needs assistance Sitting-balance support: Feet supported;No upper extremity supported Sitting balance-Leahy Scale: Fair     Standing balance support: Bilateral  upper extremity supported;During functional activity Standing balance-Leahy Scale: Poor Standing balance comment: Able to perform urination standing with Min guard assist for safety. Able to wash hands with cues to maintain back precautions - unsteady but no LOB.                   ADL Overall ADL's : Needs assistance/impaired                                       General ADL Comments: Session focused on bed mobility due to wife concerns and reporting pain due to (A)ing patient. Pt and wife completed chair to bed. Pt reports inabilityt o progress with ambulation after 4 feet. Ot verbalized concerns with fall upon d/c. Wife reports having a w/c at home . ot educated on positioning of w/c to prevent falls. Pt and wife completed bed mobility using a sheet to help with wife body mechanic and correct positioning for patient. pt reports severe pain in R knee and has a hx of previous injury. RN fran notified of fall risk due to decr ambulation with pain      Vision                     Perception     Praxis      Cognition   Behavior During Therapy: [facility] for tasks assessed/performed Overall Cognitive Status: Impaired/Different from baseline Area of Impairment: Safety/judgement     Memory: Decreased  short-term memory    Safety/Judgement: Decreased awareness of safety     General Comments: Pt needs constant cues to prevent breaking back precautions    Extremity/Trunk Assessment               Exercises     Shoulder Instructions       General Comments      Pertinent Vitals/ Pain       Pain Assessment: 0-10 Pain Score: 10-Worst pain ever Faces Pain Scale: Hurts whole lot Pain Location: R knee  Pain Descriptors / Indicators: Constant Pain Intervention(s): Monitored during session;Premedicated before session;Repositioned  Home Living                                          Prior Functioning/Environment               Frequency Min 2X/week     Progress Toward Goals  OT Goals(current goals can now be found in the care plan section)  Progress towards OT goals: Progressing toward goals  Acute Rehab OT Goals Patient Stated Goal: to go home OT Goal Formulation: With patient Time For Goal Achievement: 09/06/14 Potential to Achieve Goals: Good ADL Goals Pt Will Perform Grooming: with supervision;standing Pt Will Transfer to Toilet: with supervision;ambulating Pt Will Perform Toileting - Clothing Manipulation and hygiene: with supervision;sit to/from stand;with adaptive equipment Additional ADL Goal #1: Pt will perform bed mobility with Supervision using log roll technique to prepare for ADLs.   Plan Discharge plan needs to be updated    Co-evaluation                 End of Session Equipment Utilized During Treatment: Gait belt;Rolling walker;Back brace   Activity Tolerance Patient limited by pain   Patient Left in chair;with call bell/phone within reach;with family/visitor present   Nurse Communication Mobility status;Precautions        Time: 7078-6754 OT Time Calculation (min): 27 min  Charges: OT General Charges $OT Visit: 1 Procedure OT Treatments $Self Care/Home Management : 23-37 mins  Peri Maris 08/26/2014, 3:04 PM  Pager: [phone]

## 2014-08-26 NOTE — Progress Notes (Signed)
Physical Therapy Treatment Patient Details Name: Richard Wallace MRN: [id number] DOB: [birth year] Today's Date: 08/26/2014    History of Present Illness [name] is a 3 M with CKD, HTN who underwent spinal laminectomy and fusion.   PT Comments    Ambulation distance limited today secondary to knee pain. Discussed techniques for wife to assist pt with getting LEs into bed - use of stool or chair next to bed. Encouraged AROM of knee as tolerated and icing to help relieve pain/warmth. RN notified of new knee pain. Continues to requires max cues to adhere to back precautions during activities/mobility. Will continue to follow and progress as tolerated.    Follow Up Recommendations  Home health PT;Supervision/Assistance - 24 hour     Equipment Recommendations  None recommended by PT    Recommendations for Other Services       Precautions / Restrictions Precautions Precautions: Back Precaution Booklet Issued: No Precaution Comments: Able to independently verbalize 3/3 back precautions. Required Braces or Orthoses: Spinal Brace Spinal Brace: Lumbar corset Restrictions Weight Bearing Restrictions: No    Mobility  Bed Mobility Overal bed mobility: Needs Assistance Bed Mobility: Rolling;Sidelying to Sit Rolling: Min guard Sidelying to sit: Min guard;HOB elevated       General bed mobility comments: HOB elevated (to simulate bed at home), no use of rails. Increased time. Good demo of log roll technique.  Transfers Overall transfer level: Needs assistance Equipment used: Rolling walker (2 wheeled) Transfers: Sit to/from Stand Sit to Stand: Min guard;From elevated surface         General transfer comment: Able to stand from elevated surface without physical assist. Constant cues for hand placement.  Ambulation/Gait Ambulation/Gait assistance: Min assist Ambulation Distance (Feet): 25 Feet Assistive device: Rolling walker (2 wheeled) Gait Pattern/deviations: Step-to  pattern;Decreased stride length;Decreased stance time - right;Decreased dorsiflexion - right;Trunk flexed   Gait velocity interpretation: Below normal speed for age/gender General Gait Details: Pt with increased pain in right knee today limiting ambulation distance. Decreased stance time RLE and decreased knee extension during stance phase. Unsteady.   Stairs            Wheelchair Mobility    Modified Rankin (Stroke Patients Only)       Balance Overall balance assessment: Needs assistance Sitting-balance support: Feet supported;No upper extremity supported Sitting balance-Leahy Scale: Fair     Standing balance support: During functional activity Standing balance-Leahy Scale: Poor Standing balance comment: Able to perform urination standing with Min guard assist for safety. Able to wash hands with cues to maintain back precautions - unsteady but no LOB.                    Cognition Arousal/Alertness: Awake/alert Behavior During Therapy: WFL for tasks assessed/performed Overall Cognitive Status: Impaired/Different from baseline Area of Impairment: Safety/judgement     Memory: Decreased short-term memory   Safety/Judgement: Decreased awareness of safety     General Comments: Pt leaning on sink in bathroom to wash hands and told to stand upright, "I know I am not supposed to bend but I have to!"    Exercises      General Comments General comments (skin integrity, edema, etc.): Warmth in right knee to touch; pain with AROM knee extension/flexion worse in extension. Pain lessens with PROM knee ex/flexion however still present. Painful to palpation medial joint line.      Pertinent Vitals/Pain Pain Assessment: Faces Faces Pain Scale: Hurts whole lot Pain Location: right knee with movement and weight  bearing. Pain Descriptors / Indicators: Sore Pain Intervention(s): Limited activity within patient's tolerance;Monitored during session;Premedicated before  session;Repositioned    Home Living                      Prior Function            PT Goals (current goals can now be found in the care plan section) Progress towards PT goals: Not progressing toward goals - comment (secondary to knee pain today)    Frequency  Min 5X/week    PT Plan Current plan remains appropriate    Co-evaluation             End of Session Equipment Utilized During Treatment: Gait belt;Back brace Activity Tolerance: Patient limited by pain Patient left: in chair;with call bell/phone within reach;with family/visitor present     Time: 5035-4656 PT Time Calculation (min) (ACUTE ONLY): 26 min  Charges:  $Gait Training: 8-22 mins $Therapeutic Activity: 8-22 mins                    G CodesCandy Sledge A 09-05-14, 11:13 AM Candy Sledge, PT, DPT [phone]

## 2014-08-27 LAB — POCT I-STAT 4, (NA,K, GLUC, HGB,HCT)
Glucose, Bld: 119 mg/dL — ABNORMAL HIGH (ref 70–99)
Glucose, Bld: 115 mg/dL — ABNORMAL HIGH (ref 70–99)
HCT: 37 % — ABNORMAL LOW (ref 39.0–52.0)
HCT: 34 % — ABNORMAL LOW (ref 39.0–52.0)
Hemoglobin: 12.6 g/dL — ABNORMAL LOW (ref 13.0–17.0)
Hemoglobin: 11.6 g/dL — ABNORMAL LOW (ref 13.0–17.0)
Potassium: 4.5 mmol/L (ref 3.5–5.1)
Potassium: 4.6 mmol/L (ref 3.5–5.1)
Sodium: 138 mmol/L (ref 135–145)
Sodium: 139 mmol/L (ref 135–145)

## 2014-08-27 MED ORDER — HYDROCORTISONE 2.5 % RE CREA
TOPICAL_CREAM | Freq: Three times a day (TID) | RECTAL | Status: DC
  Administered 2014-08-27 – 2014-08-28 (×3): via RECTAL
  Administered 2014-08-28: 1 via RECTAL
  Administered 2014-08-29 – 2014-08-30 (×3): via RECTAL
  Filled 2014-08-27: qty 28.35

## 2014-08-27 NOTE — Progress Notes (Signed)
Physical Therapy Treatment Patient Details Name: Richard Wallace MRN: [id number] DOB: December 20, 1945 Today's Date: 08/27/2014    History of Present Illness Richard Wallace is a 89 M with CKD, HTN who underwent spinal laminectomy and fusionMr. Richard Wallace is a 27 M with CKD, HTN who underwent spinal laminectomy and fusion.     PT Comments    Patient continues to have pain in right knee limiting mobility and ambulation. Reviewed bed mobility with wife and pt and attempted to take a few steps to transfer to chair. Pt declined any further ambulation due to pain. Performed there ex in chair to help reduce swelling. New area of fluid noted in superior medial knee. Pt to have orthopedic consult. Recommend family training next session to prepare pt for safe discharge home esp due to decrease in functional status. Will continue to follow.   Follow Up Recommendations  Home health PT;Supervision/Assistance - 24 hour     Equipment Recommendations  None recommended by PT    Recommendations for Other Services       Precautions / Restrictions Precautions Precautions: Back Precaution Booklet Issued: No Precaution Comments: Able to independently verbalize 3/3 back precautions. Required Braces or Orthoses: Spinal Brace Spinal Brace: Lumbar corset Restrictions Weight Bearing Restrictions: No    Mobility  Bed Mobility Overal bed mobility: Needs Assistance Bed Mobility: Rolling;Sidelying to Sit Rolling: Min guard Sidelying to sit: Min guard;HOB elevated       General bed mobility comments: HOB slightly elevated. No use of rail. No physical assist required. Good demo of log roll.  Transfers Overall transfer level: Needs assistance Equipment used: Rolling walker (2 wheeled) Transfers: Sit to/from Stand Sit to Stand: Min assist;From elevated surface         General transfer comment: Min A to stand from EOB with multiple attempts without success. Pulled up on RW to get to standing despite cues.    Ambulation/Gait Ambulation/Gait assistance: Min assist Ambulation Distance (Feet): 4 Feet Assistive device: Rolling walker (2 wheeled) Gait Pattern/deviations: Step-to pattern;Decreased stride length;Narrow base of support;Decreased stance time - right   Gait velocity interpretation: Below normal speed for age/gender General Gait Details: Able to take a few steps to chair with increased pain through RLE with weight bearing. Declined any further ambulation. "I can't"    Stairs            Wheelchair Mobility    Modified Rankin (Stroke Patients Only)       Balance Overall balance assessment: Needs assistance Sitting-balance support: Feet supported;No upper extremity supported Sitting balance-Leahy Scale: Good     Standing balance support: During functional activity Standing balance-Leahy Scale: Poor Standing balance comment: Not able to place full weight through RLE secondary to pain. Relient on RW.                    Cognition Arousal/Alertness: Awake/alert Behavior During Therapy: WFL for tasks assessed/performed Overall Cognitive Status: Impaired/Different from baseline Area of Impairment: Safety/judgement         Safety/Judgement: Decreased awareness of safety     General Comments: Pt needs constant cues to prevent breaking back precautions    Exercises General Exercises - Lower Extremity Long Arc Quad: Right;15 reps;Seated;AROM;AAROM Hip Flexion/Marching: Right;15 reps;Seated Heel Raises: Right;15 reps;Seated    General Comments        Pertinent Vitals/Pain Pain Assessment: 0-10 Pain Score: 5  Pain Location: back/rt knee Pain Descriptors / Indicators: Sore;Aching Pain Intervention(s): Monitored during session;Limited activity within patient's tolerance;Repositioned    Home  Living                      Prior Function            PT Goals (current goals can now be found in the care plan section) Progress towards PT goals: Not  progressing toward goals - comment (secondary to knee pain)    Frequency  Min 5X/week    PT Plan Current plan remains appropriate    Co-evaluation             End of Session Equipment Utilized During Treatment: Gait belt;Back brace Activity Tolerance: Patient limited by pain Patient left: in chair;with call bell/phone within reach;with chair alarm set;with family/visitor present     Time: 8138-8719 PT Time Calculation (min) (ACUTE ONLY): 21 min  Charges:  $Therapeutic Activity: 8-22 mins                    G CodesCandy Sledge A 2014/09/12, 2:35 PM Candy Sledge, [place], DPT [phone]

## 2014-08-27 NOTE — Progress Notes (Signed)
Patient ID: Richard Wallace, male   DOB: 1945-10-27, 69 y.o.   MRN: [id number] BP 100/57 mmHg  Pulse 99  Temp(Src) 100.7 F (38.2 C) (Oral)  Resp 20  Ht 5\' 9"  (1.753 m)  Wt 101.8 kg (224 lb 6.9 oz)  BMI 33.13 kg/m2  SpO2 95% Will consult ortho for right knee. Will have foley placed for urinary retention Remains febrile. Will treat with abx for possible uti. Right knee may also be source of infection. Remains very tender.  anusol secondary to hemorrhoids

## 2014-08-27 NOTE — Progress Notes (Signed)
PT Cancellation Note  Patient Details Name: Richard Wallace MRN: [id number] DOB: [birth year]   Cancelled Treatment:    Reason Eval/Treat Not Completed: Patient declined, no reason specified pt declined participating in therapy this AM secondary to increased pain in right knee. Orthopedics to see patient today? Per pt report. Will follow up in PM if time allows and if pt willing to participate.   Candy Sledge A 08/27/2014, 11:54 AM  Candy Sledge, PT, DPT [phone]

## 2014-08-27 NOTE — Progress Notes (Signed)
Attempted to insert Foley with two person assist with no success. Coude cath has been ordered and will call 6N when it arrives to insert. Will continue to monitor. Ruben Gottron, [place] 08/27/2014 10:42 PM

## 2014-08-28 MED ORDER — CIPROFLOXACIN IN D5W 400 MG/200ML IV SOLN
400.0000 mg | Freq: Two times a day (BID) | INTRAVENOUS | Status: DC
  Administered 2014-08-29 – 2014-08-30 (×3): 400 mg via INTRAVENOUS
  Filled 2014-08-28 (×3): qty 200

## 2014-08-28 NOTE — Progress Notes (Signed)
Coude 16 Fr inserted by [place] on 6N successfully. Will continue to monitor output. Joaquin Bend E, [place] 08/28/2014 2:40 AM

## 2014-08-28 NOTE — Progress Notes (Signed)
PT Cancellation Note  Patient Details Name: Richard Wallace MRN: [id number] DOB: Nov 27, 1945   Cancelled Treatment:    Reason Eval/Treat Not Completed: Patient declined, no reason specified Pt reporting fever and chills and not feeling well. Increased pain reported in knee. Pt declining therapy due to above. Will follow up next available time.   Candy Sledge A 08/28/2014, 3:56 PM Candy Sledge, [place], DPT [phone]
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
## 2014-08-29 NOTE — Progress Notes (Signed)
Physical Therapy Treatment Patient Details Name: Richard Wallace MRN: [id number] DOB: 25-Feb-1946 Today's Date: 08/29/2014    History of Present Illness Richard Wallace is a 57 M with CKD, HTN who underwent spinal laminectomy and fusionMr. [name] is a 27 M with CKD, HTN who underwent spinal laminectomy and fusion. S/p intra articular injection right knee 4/23.    PT Comments    Patient progressing slowly with mobility. S/p right knee injection today. Reports some improved pain but continues to be limited with ambulation due to right knee pain. Wife present to practice guarding during gait training to prepare for home. Encouraged AROM right knee. Will continue to follow and progress ambulation distance as tolerated.   Follow Up Recommendations  Home health PT;Supervision/Assistance - 24 hour     Equipment Recommendations  None recommended by PT    Recommendations for Other Services       Precautions / Restrictions Precautions Precautions: Back Precaution Comments: Able to independently verbalize 3/3 back precautions. Required Braces or Orthoses: Spinal Brace Spinal Brace: Lumbar corset Restrictions Weight Bearing Restrictions: No    Mobility  Bed Mobility               General bed mobility comments: Sitting in chair upon PT arrival.  Transfers Overall transfer level: Needs assistance Equipment used: Rolling walker (2 wheeled) Transfers: Sit to/from Stand Sit to Stand: Min guard         General transfer comment: Min guard for safety. Stood from [employer], from toilet x1 with use of grab bar.  Ambulation/Gait Ambulation/Gait assistance: Min guard Ambulation Distance (Feet): 40 Feet Assistive device: Rolling walker (2 wheeled) Gait Pattern/deviations: Step-to pattern;Decreased stride length;Decreased stance time - right;Decreased step length - left;Trunk flexed   Gait velocity interpretation: Below normal speed for age/gender General Gait Details: Step to gait pattern  with decreased knee flexion secondary to pain. Cues for increased WB through BUEs during stance phase on right.   Stairs            Wheelchair Mobility    Modified Rankin (Stroke Patients Only)       Balance Overall balance assessment: Needs assistance Sitting-balance support: Feet supported;No upper extremity supported Sitting balance-Leahy Scale: Good     Standing balance support: During functional activity Standing balance-Leahy Scale: Poor                      Cognition Arousal/Alertness: Awake/alert Behavior During Therapy: WFL for tasks assessed/performed Overall Cognitive Status: Within Functional Limits for tasks assessed                      Exercises      General Comments        Pertinent Vitals/Pain Pain Assessment: Faces Faces Pain Scale: Hurts whole lot Pain Location: right knee with movement/weight bearing Pain Descriptors / Indicators: Sore Pain Intervention(s): Monitored during session;Limited activity within patient's tolerance;Repositioned    Home Living                      Prior Function            PT Goals (current goals can now be found in the care plan section) Progress towards PT goals: Progressing toward goals    Frequency  Min 5X/week    PT Plan Current plan remains appropriate    Co-evaluation             End of Session Equipment Utilized During Treatment: Gait  belt;Back brace Activity Tolerance: Patient limited by pain;Patient tolerated treatment well Patient left: in chair;with call bell/phone within reach;with chair alarm set;with family/visitor present     Time: 1320-1340 PT Time Calculation (min) (ACUTE ONLY): 20 min  Charges:  $Gait Training: 8-22 mins                    G CodesCandy Sledge A Sep 09, 2014, 1:44 PM  Candy Sledge, [place], DPT [phone]

## 2014-08-29 NOTE — Procedures (Signed)
08/29/2014  10:29 AM  PATIENT:  Richard Wallace    PRE-PROCEDURE DIAGNOSIS:  R knee pain   POST-OPERATIVE DIAGNOSIS:  Same  PROCEDURE:  Intra-articular injection right knee  PROCEDURE DETAILS:  After informed verbal consent was obtained the superolateral portal was prepped with alcohol and 4 ml of Marcaine and 1 ml of Depo-Medrol (40mg ) was injected. He tolerated this well and a Band-Aid was placed.  The patient can follow up with outpatient on an as needed basis.  Will sign off.  Please call with any questions or concerns.  Lovett Calender PA-C [phone]

## 2014-08-29 NOTE — Progress Notes (Signed)
Patient ID: Richard Wallace, male   DOB: 06-20-1945, 69 y.o.   MRN: [id number] Vital signs are stable postop day 8 Patient still having substantial difficulty with right knee pain He is to have a steroid injection there today Currently also on IV antibiotics Patient notes if knee were feeling better and he could ambulate he would like to get home

## 2014-08-30 MED ORDER — HYDROCODONE-ACETAMINOPHEN 5-325 MG PO TABS: 1.0000 | ORAL_TABLET | ORAL | Status: DC | PRN

## 2014-08-30 MED ORDER — CIPROFLOXACIN HCL 500 MG PO TABS: 500.0000 mg | ORAL_TABLET | Freq: Two times a day (BID) | ORAL | Status: DC

## 2014-08-30 NOTE — Progress Notes (Signed)
Physical Therapy Treatment Patient Details Name: Teruo Stilley MRN: [id number] DOB: 1946/01/17 Today's Date: 08/30/2014    History of Present Illness [name] is a 42 M with CKD, HTN who underwent spinal laminectomy and fusionMr. [name] is a 16 M with CKD, HTN who underwent spinal laminectomy and fusion. S/p intra articular injection right knee 4/23.    PT Comments    Patient planning to DC home today with cathter. Patients wife present and helpful when recall precautions, patient has difficult time maintaining with mobility. Patient able to walk further today. Patient did not feel need to practice "step" as he explained it was a threshold over the door. Patient and wife feel safe with DC today. Patient safe to D/C from a mobility standpoint based on progression towards goals set on PT eval.    Follow Up Recommendations  Home health PT;Supervision/Assistance - 24 hour     Equipment Recommendations  None recommended by PT    Recommendations for Other Services       Precautions / Restrictions Precautions Precautions: Back Precaution Comments: Able to independently verbalize 3/3 back precautions. Having diffucult time recalling with mobility Required Braces or Orthoses: Spinal Brace Spinal Brace: Lumbar corset Restrictions Weight Bearing Restrictions: No    Mobility  Bed Mobility               General bed mobility comments: Walking out of restroom upon arrival  Transfers Overall transfer level: Needs assistance Equipment used: Rolling walker (2 wheeled)   Sit to Stand: Supervision         General transfer comment: supervision for safety.   Ambulation/Gait Ambulation/Gait assistance: Supervision Ambulation Distance (Feet): 100 Feet Assistive device: Rolling walker (2 wheeled) Gait Pattern/deviations: Step-through pattern;Decreased stride length;Trunk flexed   Gait velocity interpretation: Below normal speed for age/gender General Gait Details: Cues for  upright posture. L knee brace placed for comfort prior to ambulatoin.    Stairs            Wheelchair Mobility    Modified Rankin (Stroke Patients Only)       Balance                                    Cognition Arousal/Alertness: Awake/alert Behavior During Therapy: WFL for tasks assessed/performed Overall Cognitive Status: Within Functional Limits for tasks assessed                      Exercises      General Comments        Pertinent Vitals/Pain Pain Score: 4  Pain Location: back and R knee.  Pain Descriptors / Indicators: Sore Pain Intervention(s): Monitored during session;Repositioned    Home Living                      Prior Function            PT Goals (current goals can now be found in the care plan section) Progress towards PT goals: Progressing toward goals    Frequency  Min 5X/week    PT Plan Current plan remains appropriate    Co-evaluation             End of Session Equipment Utilized During Treatment: Back brace Activity Tolerance: Patient tolerated treatment well Patient left: in chair;with call bell/phone within reach;with family/visitor present     Time: 8338-2505 PT Time Calculation (min) (ACUTE ONLY): 18  min  Charges:  $Gait Training: 8-22 mins                    G Codes:      Jacqualyn Posey 08/30/2014, 12:10 PM  08/30/2014 Jacqualyn Posey PTA [phone] pager [phone] office

## 2014-08-30 NOTE — Discharge Summary (Signed)
Physician Discharge Summary  Patient ID: Richard Wallace MRN: [id number] DOB/AGE: Sep 30, 1945 69 y.o.  Admit date: 08/21/2014 Discharge date: 08/30/2014  Admission Diagnoses:Lumbar stenosis with neurogenic claudication, scoliosis, stenosis, spondylolisthesis  Discharge Diagnoses: Same Active Problems:   Lumbar scoliosis   Atelectasis   Hypoxemia   Essential hypertension   Discharged Condition: good  Hospital Course: Patient underwent decompression and fusion surgery L 2 - L 5 levels.  HE did well with this.  He developed urinary retention and UTI and required indwelling Cude catheter.  He was started on iv ciprofloxacin.  He was seen in consultation by Dr. Percell Miller from Orthopedics for knee pain and received a cortisone shot.  The next day he was doing well and was cleared for discharge home after working with PT.  Urinary catheter was left in place and he was instructed to follow up with Urologists at [facility] as an outpatient.  Consults: orthopedic surgery  Significant Diagnostic Studies: None  Treatments: surgery: Decompression and fusion L 2 -L 5 levels with cortisone injection to right knee  Discharge Exam: Blood pressure 101/58, pulse 77, temperature 98.2 F (36.8 C), temperature source Oral, resp. rate 18, height 5\' 9"  (1.753 m), weight 101.8 kg (224 lb 6.9 oz), SpO2 97 %. Neurologic: Alert and oriented X 3, normal strength and tone. Normal symmetric reflexes. Normal coordination and gait Wound:CDI  Disposition: Home     Medication List    TAKE these medications        acetaminophen 500 MG tablet  Commonly known as:  TYLENOL  Take 500 mg by mouth every 6 (six) hours as needed for mild pain or moderate pain.     allopurinol 300 MG tablet  Commonly known as:  ZYLOPRIM  Take 300 mg by mouth daily.     cetirizine 10 MG tablet  Commonly known as:  ZYRTEC  Take 10 mg by mouth daily.     ciprofloxacin 500 MG tablet  Commonly known as:  CIPRO  Take 1 tablet (500  mg total) by mouth 2 (two) times daily.     HYDROcodone-acetaminophen 5-325 MG per tablet  Commonly known as:  NORCO/VICODIN  Take 1 tablet by mouth every 6 (six) hours as needed for moderate pain.     HYDROcodone-acetaminophen 5-325 MG per tablet  Commonly known as:  NORCO/VICODIN  Take 1-2 tablets by mouth every 4 (four) hours as needed (mild pain).     lansoprazole 15 MG capsule  Commonly known as:  PREVACID  Take 15 mg by mouth daily.     lisinopril-hydrochlorothiazide 10-12.5 MG per tablet  Commonly known as:  PRINZIDE,ZESTORETIC  Take 1 tablet by mouth daily.     magnesium oxide 400 MG tablet  Commonly known as:  MAG-OX  Take 400 mg by mouth daily.     multivitamin with minerals tablet  Take 1 tablet by mouth daily.     naproxen 500 MG tablet  Commonly known as:  NAPROSYN  Take 500 mg by mouth 2 (two) times daily with a meal.     SYSTANE OP  Apply 1 drop to eye every morning.           Follow-up Information    Follow up with [facility].   Why:  they will provide your home health physical therapy at your home   Contact information:   [place] [phone]       Signed: Peggyann Shoals, MD 08/30/2014, 11:19 AM

## 2014-08-30 NOTE — Progress Notes (Signed)
Subjective: Patient reports doing better.  Wants to go home.  Knee pain improved after injection.  Objective: Vital signs in last 24 hours: Temp:  [98 F (36.7 C)-99.7 F (37.6 C)] 98.4 F (36.9 C) (04/24 0554) Pulse Rate:  [67-91] 69 (04/24 0554) Resp:  [18-20] 20 (04/24 0554) BP: (96-121)/(56-65) 109/58 mmHg (04/24 0554) SpO2:  [94 %-97 %] 97 % (04/24 0554)  Intake/Output from previous day: 04/23 0701 - 04/24 0700 In: -  Out: 1800 [Urine:1800] Intake/Output this shift:    Physical Exam: Walking with walker.  Dressing CDI.  Lab Results: No results for input(s): WBC, HGB, HCT, PLT in the last 72 hours. BMET No results for input(s): NA, K, CL, CO2, GLUCOSE, BUN, CREATININE, CALCIUM in the last 72 hours.  Studies/Results: No results found.  Assessment/Plan: Making progress.  OK to D/C home.  F/U with Dr. Christella Noa in office.  Discharge with catheter and complete cipro.  Vicodin for pain.  [facility] as outpatient for catheter and urinary retention.    LOS: 9 days    Peggyann Shoals, MD 08/30/2014, 8:06 AM

## 2014-08-30 NOTE — Progress Notes (Signed)
Patient [place] home via car with wife.  DC instructions and prescriptions given to patient.  Both fully understood.  Home health PT information received along with 3 in 1 DME.  Vital signs and assessments were stable.

## 2014-08-31 NOTE — Progress Notes (Signed)
Patient discharged home Sunday (08/30/2014) [facility] called and made aware of discharge home yesterday; talked to [facility], [place] face to face orders faxed as requested; Aneta Mins [phone]

## 2016-02-15 ENCOUNTER — Other Ambulatory Visit (HOSPITAL_COMMUNITY)
Admission: RE | Admit: 2016-02-15 | Discharge: 2016-02-15 | Disposition: A | Discharge status: Home / Self Care | Payer: Medicare Other | Source: Other Acute Inpatient Hospital | Attending: Urology | Admitting: Urology

## 2016-02-15 DIAGNOSIS — N10 Acute tubulo-interstitial nephritis: Secondary | ICD-10-CM | POA: Diagnosis present

## 2016-02-16 LAB — URINE CULTURE: Culture: NO GROWTH

## 2016-02-21 ENCOUNTER — Other Ambulatory Visit: Payer: Self-pay | Admitting: Urology

## 2016-03-06 ENCOUNTER — Encounter (HOSPITAL_BASED_OUTPATIENT_CLINIC_OR_DEPARTMENT_OTHER): Payer: Self-pay | Admitting: *Deleted

## 2016-03-06 NOTE — Progress Notes (Signed)
NPO AFTER MN.  ARRIVE AT RN:[id number].  NEEDS HG .  WILL TAKE GABAPENTIN, ALLEGRA, TRAMADOL, AND PREVACID AM DOS W/ SIPS OF WATER.

## 2016-03-08 NOTE — H&P (Signed)
Urology History and Physical Exam  CC: Left sided kidney stones  HPI: 70 year old male presents for ureteroscopic mgmt of 2 left sided ureteral stones. These have been asymptomatic, as they have been associated with an atrophic/nonfunctioning kidney. However, he was recently admitted for a UTI associated with these stones in [place]. He was treated w/ antibiotics, but no kidney drainage. He actually improved without a stent or pcn tube, and then followed up in our office a few weeks ago. He has been on suppressive antibiotics.  We have discussed a simple nephrectomy vs. ureteroscopic management of these stones. I have suggested that we take the less invasive route and remove his stones. We have discussed the nature of laparoscopic nephrectomy vs. URS and fragmentation/removal of stones along with stent placement. Risks/complications of each were outlined. The patient and his wife understand and desire to proceed with URS/laser fragmentation, extraction of the stones and stent placement.  PMH: Past Medical History:  Diagnosis Date  . Arthritis   . 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 Cancer ([place]) 01-28-2014   skin cancer  . CKD (chronic kidney disease), stage III   . GERD (gastroesophageal reflux disease)   . Heart murmur   . History of hypertension    no longer issue  . History of malignant melanoma of skin    excision top of scalp 2015-- no recurrence  . History of urinary retention    post op lumbar fusion surgery 04/ 2016  . Kidney dysfunction    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  . Left ureteral calculus   . Seasonal allergies   . Wears glasses   . Wears glasses   . Wears partial dentures    upper and lower    PSH: Past Surgical History:  Procedure Laterality Date  . ANKLE FUSION Right 2007  . CARPAL TUNNEL RELEASE Left 12/28/2009   w/ pulley release left long finger  . CARPAL TUNNEL RELEASE Right 07/22/2013   Procedure: RIGHT CARPAL TUNNEL RELEASE;   Surgeon: Cammie Sickle., MD;  Location: [facility];  Service: Orthopedics;  Laterality: Right;  . COLONOSCOPY    . CYSTO/  LEFT RETROGRADE PYELOGRAM  11/21/2010  . LEFT URETEROSCOPIC LASER LITHOTRIPSY STONE EXTRACTION/ STENT PLACEMENT  05/23/2010  . ORIF ANKLE FRACTURE Right 1978  . POSTERIOR LUMBAR FUSION  08/21/2014   laminectomy and decompression L2 -- L5  . RIGHT LOWER LEG SURGERY  X3  1975 to 1976   including ORIF  . TONSILLECTOMY AND ADENOIDECTOMY  1986  . UMBILICAL HERNIA REPAIR  2009 approx    Allergies: No Known Allergies  Medications: No prescriptions prior to admission.     Social History: Social History   Social History  . Marital status: Married    Spouse name: N/A  . Number of children: N/A  . Years of education: N/A   Occupational History  . Not on file.   Social History Main Topics  . Smoking status: Former Smoker    Years: 20.00    Types: Cigarettes    Quit date: 07/17/1986  . Smokeless tobacco: Never Used  . Alcohol use 4.2 - 8.4 oz/week    7 - 14 Cans of beer per week     Comment: 1 -2 beer daily  . Drug use: No  . Sexual activity: Not on file   Other Topics Concern  . Not on file   Social History Narrative  . No narrative on file  Family History: History reviewed. No pertinent family history.  Review of Systems: Positive: N/A Negative:   A further 10 point review of systems was negative except what is listed in the HPI.                  Physical Exam: @VITALS2 @ General: No acute distress.  Awake. Head:  Normocephalic.  Atraumatic. ENT:  EOMI.  Mucous membranes moist Neck:  Supple.  No lymphadenopathy. CV:  S1 present. S2 present. Regular rate. Pulmonary: Equal effort bilaterally.  Clear to auscultation bilaterally. Abdomen: Soft.  Non tender to palpation. Skin:  Normal turgor.  No visible rash. Extremity: No gross deformity of bilateral upper extremities.  No gross deformity of                              lower extremities. Neurologic: Alert. Appropriate mood.    Studies:  No results for input(s): HGB, WBC, PLT in the last 72 hours.  No results for input(s): NA, K, CL, CO2, BUN, CREATININE, CALCIUM, GFRNONAA, GFRAA in the last 72 hours.  Invalid input(s): MAGNESIUM   No results for input(s): INR, APTT in the last 72 hours.  Invalid input(s): PT   Invalid input(s): ABG    Assessment:  Left ureteral stones/atrophic, hydronephrotic left kidney  Plan: Cysto, left RGP, left URS, HLL, extraction of stones, left J2 stent

## 2016-03-09 ENCOUNTER — Ambulatory Visit (HOSPITAL_BASED_OUTPATIENT_CLINIC_OR_DEPARTMENT_OTHER): Payer: Medicare Other | Admitting: Certified Registered"

## 2016-03-09 ENCOUNTER — Encounter (HOSPITAL_BASED_OUTPATIENT_CLINIC_OR_DEPARTMENT_OTHER): Payer: Self-pay | Admitting: Certified Registered"

## 2016-03-09 ENCOUNTER — Encounter (HOSPITAL_BASED_OUTPATIENT_CLINIC_OR_DEPARTMENT_OTHER)
Admission: RE | Disposition: A | Discharge status: Home / Self Care | Payer: Self-pay | Source: Ambulatory Visit | Attending: Urology

## 2016-03-09 ENCOUNTER — Ambulatory Visit (HOSPITAL_BASED_OUTPATIENT_CLINIC_OR_DEPARTMENT_OTHER)
Admission: RE | Admit: 2016-03-09 | Discharge: 2016-03-09 | Disposition: A | Discharge status: Home / Self Care | Payer: Medicare Other | Source: Ambulatory Visit | Attending: Urology | Admitting: Urology

## 2016-03-09 DIAGNOSIS — Z87891 Personal history of nicotine dependence: Secondary | ICD-10-CM | POA: Insufficient documentation

## 2016-03-09 DIAGNOSIS — M1990 Unspecified osteoarthritis, unspecified site: Secondary | ICD-10-CM | POA: Diagnosis not present

## 2016-03-09 DIAGNOSIS — N183 Chronic kidney disease, stage 3 (moderate): Secondary | ICD-10-CM | POA: Diagnosis not present

## 2016-03-09 DIAGNOSIS — N200 Calculus of kidney: Secondary | ICD-10-CM | POA: Diagnosis present

## 2016-03-09 DIAGNOSIS — K219 Gastro-esophageal reflux disease without esophagitis: Secondary | ICD-10-CM | POA: Diagnosis not present

## 2016-03-09 DIAGNOSIS — N202 Calculus of kidney with calculus of ureter: Secondary | ICD-10-CM | POA: Diagnosis not present

## 2016-03-09 DIAGNOSIS — I129 Hypertensive chronic kidney disease with stage 1 through stage 4 chronic kidney disease, or unspecified chronic kidney disease: Secondary | ICD-10-CM | POA: Diagnosis not present

## 2016-03-09 HISTORY — DX: Personal history of malignant melanoma of skin: Z85.820

## 2016-03-09 HISTORY — DX: Personal history of other specified conditions: Z87.898

## 2016-03-09 HISTORY — DX: Chronic kidney disease, stage 3 (moderate): N18.3

## 2016-03-09 HISTORY — PX: CYSTOSCOPY WITH STENT PLACEMENT: SHX5790

## 2016-03-09 HISTORY — DX: Atrophy of kidney (terminal): N26.1

## 2016-03-09 HISTORY — DX: Presence of dental prosthetic device (complete) (partial): Z97.2

## 2016-03-09 HISTORY — DX: Calculus of ureter: N20.1

## 2016-03-09 HISTORY — DX: Personal history of other diseases of the circulatory system: Z86.79

## 2016-03-09 HISTORY — DX: Chronic kidney disease, stage 3 unspecified: N18.30

## 2016-03-09 HISTORY — PX: CYSTOSCOPY/RETROGRADE/URETEROSCOPY/STONE EXTRACTION WITH BASKET: SHX5317

## 2016-03-09 LAB — POCT I-STAT, CHEM 8
BUN: 17 mg/dL (ref 6–20)
Calcium, Ion: 1.25 mmol/L (ref 1.15–1.40)
Chloride: 103 mmol/L (ref 101–111)
Creatinine, Ser: 1.2 mg/dL (ref 0.61–1.24)
Glucose, Bld: 94 mg/dL (ref 65–99)
HCT: 43 % (ref 39.0–52.0)
Hemoglobin: 14.6 g/dL (ref 13.0–17.0)
Potassium: 4.6 mmol/L (ref 3.5–5.1)
Sodium: 140 mmol/L (ref 135–145)
TCO2: 28 mmol/L (ref 0–100)

## 2016-03-09 LAB — POCT HEMOGLOBIN-HEMACUE: Hemoglobin: 14.5 g/dL (ref 13.0–17.0)

## 2016-03-09 SURGERY — CYSTOSCOPY, WITH CALCULUS REMOVAL USING BASKET
Anesthesia: General | Laterality: Left

## 2016-03-09 MED ORDER — ONDANSETRON HCL 4 MG/2ML IJ SOLN
INTRAMUSCULAR | Status: AC
  Filled 2016-03-09: qty 2

## 2016-03-09 MED ORDER — LIDOCAINE 2% (20 MG/ML) 5 ML SYRINGE
INTRAMUSCULAR | Status: DC | PRN
  Administered 2016-03-09: 60 mg via INTRAVENOUS

## 2016-03-09 MED ORDER — PROPOFOL 10 MG/ML IV BOLUS
INTRAVENOUS | Status: AC
  Filled 2016-03-09: qty 20

## 2016-03-09 MED ORDER — PROPOFOL 10 MG/ML IV BOLUS
INTRAVENOUS | Status: DC | PRN
  Administered 2016-03-09: 160 mg via INTRAVENOUS

## 2016-03-09 MED ORDER — FENTANYL CITRATE (PF) 100 MCG/2ML IJ SOLN
INTRAMUSCULAR | Status: AC
  Filled 2016-03-09: qty 2

## 2016-03-09 MED ORDER — OXYBUTYNIN CHLORIDE 5 MG PO TABS: 5.0000 mg | ORAL_TABLET | Freq: Three times a day (TID) | ORAL | 1 refills | Status: DC | PRN

## 2016-03-09 MED ORDER — EPHEDRINE SULFATE 50 MG/ML IJ SOLN
INTRAMUSCULAR | Status: DC | PRN
  Administered 2016-03-09: 10 mg via INTRAVENOUS

## 2016-03-09 MED ORDER — SODIUM CHLORIDE 0.9 % IR SOLN
Status: DC | PRN
  Administered 2016-03-09: 4000 mL

## 2016-03-09 MED ORDER — DEXAMETHASONE SODIUM PHOSPHATE 4 MG/ML IJ SOLN
INTRAMUSCULAR | Status: DC | PRN
  Administered 2016-03-09: 10 mg via INTRAVENOUS

## 2016-03-09 MED ORDER — FENTANYL CITRATE (PF) 100 MCG/2ML IJ SOLN
INTRAMUSCULAR | Status: DC | PRN
  Administered 2016-03-09: 50 ug via INTRAVENOUS
  Administered 2016-03-09: 25 ug via INTRAVENOUS

## 2016-03-09 MED ORDER — EPHEDRINE 5 MG/ML INJ
INTRAVENOUS | Status: AC
  Filled 2016-03-09: qty 10

## 2016-03-09 MED ORDER — CEFAZOLIN SODIUM-DEXTROSE 2-4 GM/100ML-% IV SOLN
INTRAVENOUS | Status: AC
  Filled 2016-03-09: qty 100

## 2016-03-09 MED ORDER — ONDANSETRON HCL 4 MG/2ML IJ SOLN
INTRAMUSCULAR | Status: DC | PRN
  Administered 2016-03-09: 4 mg via INTRAVENOUS

## 2016-03-09 MED ORDER — SULFAMETHOXAZOLE-TRIMETHOPRIM 800-160 MG PO TABS: 1.0000 | ORAL_TABLET | Freq: Two times a day (BID) | ORAL | 0 refills | Status: DC

## 2016-03-09 MED ORDER — LIDOCAINE 2% (20 MG/ML) 5 ML SYRINGE
INTRAMUSCULAR | Status: AC
  Filled 2016-03-09: qty 5

## 2016-03-09 MED ORDER — CEFAZOLIN IN D5W 1 GM/50ML IV SOLN
1.0000 g | INTRAVENOUS | Status: AC
  Administered 2016-03-09: 2 g via INTRAVENOUS
  Filled 2016-03-09: qty 50

## 2016-03-09 MED ORDER — DEXAMETHASONE SODIUM PHOSPHATE 10 MG/ML IJ SOLN
INTRAMUSCULAR | Status: AC
  Filled 2016-03-09: qty 1

## 2016-03-09 MED ORDER — LACTATED RINGERS IV SOLN
INTRAVENOUS | Status: DC
  Administered 2016-03-09: 09:00:00 via INTRAVENOUS
  Filled 2016-03-09: qty 1000

## 2016-03-09 MED ORDER — IOHEXOL 300 MG/ML  SOLN
INTRAMUSCULAR | Status: DC | PRN
  Administered 2016-03-09: 7 mL

## 2016-03-09 MED ORDER — BELLADONNA ALKALOIDS-OPIUM 16.2-60 MG RE SUPP
RECTAL | Status: AC
  Filled 2016-03-09: qty 1

## 2016-03-09 SURGICAL SUPPLY — 31 items
BAG DRAIN URO-CYSTO SKYTR STRL (DRAIN) ×2 IMPLANT
BASKET DAKOTA 1.9FR 11X120 (BASKET) ×2 IMPLANT
BASKET STNLS GEMINI 4WIRE 3FR (BASKET) IMPLANT
BASKET ZERO TIP NITINOL 2.4FR (BASKET) IMPLANT
CATH INTERMIT  6FR 70CM (CATHETERS) IMPLANT
CATH URET 5FR 28IN CONE TIP (BALLOONS)
CATH URET 5FR 28IN OPEN ENDED (CATHETERS) IMPLANT
CATH URET 5FR 70CM CONE TIP (BALLOONS) IMPLANT
CLOTH BEACON ORANGE TIMEOUT ST (SAFETY) ×2 IMPLANT
FIBER LASER FLEXIVA 365 (UROLOGICAL SUPPLIES) IMPLANT
FIBER LASER TRAC TIP (UROLOGICAL SUPPLIES) IMPLANT
GLOVE BIO SURGEON STRL SZ 6.5 (GLOVE) ×2 IMPLANT
GLOVE BIO SURGEON STRL SZ8 (GLOVE) ×2 IMPLANT
GLOVE BIOGEL PI IND STRL 6.5 (GLOVE) ×2 IMPLANT
GLOVE BIOGEL PI INDICATOR 6.5 (GLOVE) ×2
GOWN STRL REUS W/ TWL LRG LVL3 (GOWN DISPOSABLE) ×1 IMPLANT
GOWN STRL REUS W/ TWL XL LVL3 (GOWN DISPOSABLE) ×1 IMPLANT
GOWN STRL REUS W/TWL LRG LVL3 (GOWN DISPOSABLE) ×1
GOWN STRL REUS W/TWL XL LVL3 (GOWN DISPOSABLE) ×1
GUIDEWIRE 0.038 PTFE COATED (WIRE) IMPLANT
GUIDEWIRE ANG ZIPWIRE 038X150 (WIRE) IMPLANT
GUIDEWIRE STR DUAL SENSOR (WIRE) IMPLANT
IV NS IRRIG 3000ML ARTHROMATIC (IV SOLUTION) ×2 IMPLANT
KIT ROOM TURNOVER WOR (KITS) ×2 IMPLANT
LASER FIBER DISP (UROLOGICAL SUPPLIES) IMPLANT
MANIFOLD NEPTUNE II (INSTRUMENTS) IMPLANT
NS IRRIG 500ML POUR BTL (IV SOLUTION) IMPLANT
PACK CYSTO (CUSTOM PROCEDURE TRAY) ×2 IMPLANT
SHEATH ACCESS URETERAL 38CM (SHEATH) ×2 IMPLANT
STENT URET 6FRX24 CONTOUR (STENTS) ×2 IMPLANT
TUBE CONNECTING 12X1/4 (SUCTIONS) IMPLANT

## 2016-03-09 NOTE — Op Note (Signed)
Preoperative diagnosis: Left proximal and Midureteral calculi.  Postoperative diagnosis: Left proximal ureteral stone, left renal stone, bladder stone.  Principal procedure: Cystoscopy, removal of bladder stone, left retrograde ureteropyelogram with fluoroscopic interpretation, left ureteroscopy-rigid and flexible, extraction of left proximal ureteral and small renal calculi, placement of double-J stent-24 centimeter by 6 [place] contour with tether  Surgeon: Legacy Lacivita  Anesthesia: Gen. With LMA  Complications: None.  Specimens: Stones, to the patient's wife  Estimated blood loss: None.  Indications: 70 year old male with known left ureteral stone, a nonfunctioning and atrophic left kidney.  He presented to the emergency room and was hospitalized in [place] in September for obstructing left ureteral calculi and pyelonephritis.  Interestingly, the patient did not have urgent decompression of his left kidney, but was treated with antibiotics alone.  He presented following that to my care/follow-up.  Patient did come with the CT scan which revealed an obstructing left proximal ureteral stone as well as a mid ureteral stone that was approximately 9 by 11 millimeters in size.  We talked about treatment options-he was not symptomatic in terms of pain from the stone burden, as he had a nonfunctioning, atrophic kidney that was hydronephrotic.  At the time of his hospitalization.  We talked about nephrectomy to remove the kidney and obviate subsequent stone burden in infections.  We talked about ureteroscopy to remove the stone burden, but leaving the kidney in place, a more conservative procedure.  We eventually decided to go with ureteroscopy, holmium laser and extraction of any stones.  The risks and complications of the procedure were discussed with the patient-these include introducing infection, injury to the bladder/ureter, anesthetic complications, ureteral stricture, among others.  The patient and  his wife understand these and desires to proceed.  Findings: The large stone which was in the mid ureter was now on the bladder.  That was easily removed with the cystoscope.  Bladder revealed normal urothelium without evidence of mucosal abnormality.  Ureteral orifices were normal in configuration and location.  Retrograde ureteropyelogram on the left revealed a filling defect in the proximal ureter.  There was no significant hydroureteronephrosis.  No filling defects were seen in the renal pelvis on the left with calyceal system.  Description of procedure: The patient was properly identified and marked in the holding area.  He received 2 grams of Ancef intravenously.  He was taken to the operating room where general anesthesia was administered with the LMA.  He was placed in the dorsolithotomy position.  Genitalia and perineum were prepped and draped.  Proper timeout was performed.  A 23 French panendoscope was advanced under direct vision through his urethra which was normal.  Prostate was nonobstructive.  His bladder was entered and inspected circumferentially.  The stone was identified and removed through the cystoscope.  The retrograde ureteropyelogram was then performed using a 6 [place] open-ended catheter, and Cystografin.  The above findings were seen.  Following the retrograde, a 0.038 inch sensor-tip guidewire was advanced through the open-ended catheter, through the ureter, and a curl was seen in the upper pole calyceal system.  The open-ended catheter and the cystoscope were removed, leaving the guidewire in place.  I then dilated the distal and mid ureter first with the inner core and then the entire 04/1437 centimeter ureteral access catheter.  Following this, the ureteral access catheter was removed, and the guidewire left in place.  The 6 French dual-lumen semirigid ureteroscope was then advanced through the urethra, into the bladder and up the ureter.  The  entire ureter was inspected up to  the UPJ.  The stone previously seen .  As the filling defect on the retrograde was identified in the proximal ureter and grasped with the [place] basket.  It was then easily extracted through the ureter.  I then removed the semirigid ureteroscope, and over top of the guidewire past the ureteral access catheter.  The inner core was removed as well as the sensor-tip guidewire.  The 6 French dual-lumen flexible ureteroscope was advanced into the left renal pelvis through the ureteral access sheath.  The entire calyceal system was identified.  There were no urothelial abnormalities.  There was one small stone.  There was identified-this was grasped with the [place] basket, but this crumbled.  The smaller fragments were removed.  Prior to its disintegration, this stone was approximately 3 millimeters in size.  Once the entire pyelocalyceal system was reinspected and no further stones were seen, the scope was removed.  The guidewire was replaced through the access catheter.  The access catheter was removed, and the guidewire was backloaded through the cystoscope.  The cystoscope was then passed through the urethra and into the bladder.  Using cystoscopic and fluoroscopic guidance, 24 centimeter by 6 French contour double-J stent was placed, with the tether left on the distal end.  The guidewire was removed to properly deploy the double-J stent.  Position was checked both fluoroscopically and cystoscopically.  The scope was then removed.  The bladder was drained prior to this.  The string was brought out through the patient's penis, cut to size, and taped to the outside penile skin.  The patient tolerated procedure well.  He was then awakened and taken to the PACU in stable condition.

## 2016-03-09 NOTE — Anesthesia Procedure Notes (Signed)
Procedure Name: LMA Insertion Date/Time: 03/09/2016 9:35 AM Performed by: Denna Haggard D Pre-anesthesia Checklist: Patient identified, Emergency Drugs available, Suction available and Patient being monitored Patient Re-evaluated:Patient Re-evaluated prior to inductionOxygen Delivery Method: Circle system utilized Preoxygenation: Pre-oxygenation with 100% oxygen Intubation Type: IV induction Ventilation: Mask ventilation without difficulty LMA: LMA inserted LMA Size: 4.0 Number of attempts: 1 Airway Equipment and Method: Bite block Placement Confirmation: positive ETCO2 Tube secured with: Tape Dental Injury: Teeth and Oropharynx as per pre-operative assessment

## 2016-03-09 NOTE — Transfer of Care (Signed)
Immediate Anesthesia Transfer of Care Note  Patient: Richard Wallace  Procedure(s) Performed: Procedure(s) (LRB): CYSTOSCOPY/RETROGRADE/URETEROSCOPY/STONE EXTRACTION WITH BASKET (Left) CYSTOSCOPY WITH STENT PLACEMENT (Left) HOLMIUM LASER APPLICATION (Left)  Patient Location: PACU  Anesthesia Type: General  Level of Consciousness: awake, oriented, sedated and patient cooperative  Airway & Oxygen Therapy: Patient Spontanous Breathing and Patient connected to face mask oxygen  Post-op Assessment: Report given to PACU RN and Post -op Vital signs reviewed and stable  Post vital signs: Reviewed and stable  Complications: No apparent anesthesia complications  Last Vitals:  Vitals:   03/09/16 0822 03/09/16 1017  BP: 140/85 (P) 134/89  Resp: 18 (P) 12  Temp: 36.4 C (P) 36.8 C

## 2016-03-09 NOTE — Discharge Instructions (Signed)
°  Post Anesthesia Home Care Instructions  Activity: Get plenty of rest for the remainder of the day. A responsible adult should stay with you for 24 hours following the procedure.  For the next 24 hours, DO NOT: -Drive a car -Paediatric nurse -Drink alcoholic beverages -Take any medication unless instructed by your physician -Make any legal decisions or sign important papers.  Meals: Start with liquid foods such as gelatin or soup. Progress to regular foods as tolerated. Avoid greasy, spicy, heavy foods. If nausea and/or vomiting occur, drink only clear liquids until the nausea and/or vomiting subsides. Call your physician if vomiting continues.  Special Instructions/Symptoms: Your throat may feel dry or sore from the anesthesia or the breathing tube placed in your throat during surgery. If this causes discomfort, gargle with warm salt water. The discomfort should disappear within 24 hours.  If you had a scopolamine patch placed behind your ear for the management of post- operative nausea and/or vomiting:  1. The medication in the patch is effective for 72 hours, after which it should be removed.  Wrap patch in a tissue and discard in the trash. Wash hands thoroughly with soap and water. 2. You may remove the patch earlier than 72 hours if you experience unpleasant side effects which may include dry mouth, dizziness or visual disturbances. 3. Avoid touching the patch. Wash your hands with soap and water after contact with the patch.   POSTOPERATIVE CARE AFTER URETEROSCOPY  Stent management  *Stents are often left in after ureteroscopy and stone treatment. If left in, they often cause urinary frequency, urgency, occasional blood in the urine, as well as flank discomfort with urination. These are all expected issues, and should resolve after the stent is removed. *Often times, a small thread is left on the end of the stent, and brought out through the urethra. If so, this is used to remove  the stent, making it unnecessary to look in the bladder with a scope in the office to remove the stent. If a thread is left on, did not pull on it until instructed. *It is okay to pull the string and remove the stent on Friday morning  Diet  Once you have adequately recovered from anesthesia, you may gradually advance your diet, as tolerated, to your regular diet.  Activities  You may gradually increase your activities to your normal unrestricted level the day following your procedure.  Medications  You should resume all preoperative medications. If you are on aspirin-like compounds, you should not resume these until the blood clears from your urine. If given an antibiotic by the surgeon, take these until they are completed. You may also be given, if you have a stent, medications to decrease the urinary frequency and urgency.  Pain  After ureteroscopy, there may be some pain on the side of the scope. Take your pain medicine for this. Usually, this pain resolves within a day or 2.  Fever  Please report any fever over 100 to the doctor.

## 2016-03-09 NOTE — Anesthesia Preprocedure Evaluation (Signed)
Anesthesia Evaluation  Patient identified by MRN, date of birth, ID band Patient awake    Reviewed: Allergy & Precautions, NPO status , Patient's Chart, lab work & pertinent test results  Airway Mallampati: I       Dental  (+) Partial Upper, Partial Lower, Dental Advisory Given   Pulmonary former smoker,    breath sounds clear to auscultation       Cardiovascular hypertension,  Rhythm:Regular Rate:Normal     Neuro/Psych negative neurological ROS  negative psych ROS   GI/Hepatic Neg liver ROS, GERD  Medicated,  Endo/Other  negative endocrine ROS  Renal/GU CRFRenal disease  negative genitourinary   Musculoskeletal  (+) Arthritis , Osteoarthritis,    Abdominal Normal abdominal exam  (+)   Peds negative pediatric ROS (+)  Hematology negative hematology ROS (+)   Anesthesia Other Findings   Reproductive/Obstetrics negative OB ROS                             Anesthesia Physical Anesthesia Plan  ASA: II  Anesthesia Plan: General   Post-op Pain Management:    Induction: Intravenous  Airway Management Planned: LMA  Additional Equipment:   Intra-op Plan:   Post-operative Plan: Extubation in OR  Informed Consent: I have reviewed the patients History and Physical, chart, labs and discussed the procedure including the risks, benefits and alternatives for the proposed anesthesia with the patient or authorized representative who has indicated his/her understanding and acceptance.   Dental advisory given  Plan Discussed with: CRNA  Anesthesia Plan Comments:         Anesthesia Quick Evaluation

## 2016-03-09 NOTE — Anesthesia Postprocedure Evaluation (Signed)
Anesthesia Post Note  Patient: Richard Wallace  Procedure(s) Performed: Procedure(s) (LRB): CYSTOSCOPY/RETROGRADE/URETEROSCOPY/STONE EXTRACTION WITH BASKET (Left) CYSTOSCOPY WITH STENT PLACEMENT (Left)  Patient location during evaluation: PACU Anesthesia Type: General Level of consciousness: awake and alert Pain management: pain level controlled Vital Signs Assessment: post-procedure vital signs reviewed and stable Respiratory status: spontaneous breathing, nonlabored ventilation, respiratory function stable and patient connected to nasal cannula oxygen Cardiovascular status: blood pressure returned to baseline and stable Postop Assessment: no signs of nausea or vomiting Anesthetic complications: no    Last Vitals:  Vitals:   03/09/16 1130 03/09/16 1216  BP: (!) 142/80 139/87  Pulse: 80 81  Resp: 18 16  Temp:  36.8 C    Last Pain:  Vitals:   03/09/16 1216  TempSrc: Oral  PainSc: 0-No pain                 Effie Berkshire

## 2016-03-10 ENCOUNTER — Encounter (HOSPITAL_BASED_OUTPATIENT_CLINIC_OR_DEPARTMENT_OTHER): Payer: Self-pay | Admitting: Urology

## 2016-03-16 ENCOUNTER — Encounter (HOSPITAL_BASED_OUTPATIENT_CLINIC_OR_DEPARTMENT_OTHER): Payer: Self-pay | Admitting: Urology

## 2018-05-17 ENCOUNTER — Other Ambulatory Visit: Payer: Self-pay | Admitting: Orthopedic Surgery

## 2018-06-20 ENCOUNTER — Other Ambulatory Visit: Payer: Self-pay | Admitting: Orthopedic Surgery

## 2018-06-20 NOTE — Patient Instructions (Signed)
Richard Wallace  06/20/2018   Your procedure is scheduled on: 06-28-2018   Report to [facility] Main  Entrance     Report to [place] at 5:30AM    Call this number if you have problems the morning of surgery [phone]      Remember: Do not eat food or drink liquids :After Midnight. BRUSH YOUR TEETH MORNING OF SURGERY AND RINSE YOUR MOUTH OUT, NO CHEWING GUM CANDY OR MINTS.     Take these medicines the morning of surgery with A SIP OF WATER: zyrtec, flonase , prevacid, tylenol                                 You may not have any metal on your body including hair pins and              piercings  Do not wear jewelry, make-up, lotions, powders or perfumes, deodorant                        Men may shave face and neck.   Do not bring valuables to the hospital. [place].  Contacts, dentures or bridgework may not be worn into surgery.  Leave suitcase in the car. After surgery it may be brought to your room.                  Please read over the following fact sheets you were given: _____________________________________________________________________             [facility] - Preparing for Surgery Before surgery, you can play an important role.  Because skin is not sterile, your skin needs to be as free of germs as possible.  You can reduce the number of germs on your skin by washing with CHG (chlorahexidine gluconate) soap before surgery.  CHG is an antiseptic cleaner which kills germs and bonds with the skin to continue killing germs even after washing. Please DO NOT use if you have an allergy to CHG or antibacterial soaps.  If your skin becomes reddened/irritated stop using the CHG and inform your nurse when you arrive at Short Stay. Do not shave (including legs and underarms) for at least 48 hours prior to the first CHG shower.  You may shave your face/neck. Please follow these instructions  carefully:  1.  Shower with CHG Soap the night before surgery and the  morning of Surgery.  2.  If you choose to wash your hair, wash your hair first as usual with your  normal  shampoo.  3.  After you shampoo, rinse your hair and body thoroughly to remove the  shampoo.                           4.  Use CHG as you would any other liquid soap.  You can apply chg directly  to the skin and wash                       Gently with a scrungie or clean washcloth.  5.  Apply the CHG Soap to your body ONLY FROM THE NECK DOWN.   Do not use on  face/ open                           Wound or open sores. Avoid contact with eyes, ears mouth and genitals (private parts).                       Wash face,  Genitals (private parts) with your normal soap.             6.  Wash thoroughly, paying special attention to the area where your surgery  will be performed.  7.  Thoroughly rinse your body with warm water from the neck down.  8.  DO NOT shower/wash with your normal soap after using and rinsing off  the CHG Soap.                9.  Pat yourself dry with a clean towel.            10.  Wear clean pajamas.            11.  Place clean sheets on your bed the night of your first shower and do not  sleep with pets. Day of Surgery : Do not apply any lotions/deodorants the morning of surgery.  Please wear clean clothes to the hospital/surgery center.  FAILURE TO FOLLOW THESE INSTRUCTIONS MAY RESULT IN THE CANCELLATION OF YOUR SURGERY PATIENT SIGNATURE_________________________________  NURSE SIGNATURE__________________________________  ________________________________________________________________________   Richard Wallace  An incentive spirometer is a tool that can help keep your lungs clear and active. This tool measures how well you are filling your lungs with each breath. Taking long deep breaths may help reverse or decrease the chance of developing breathing (pulmonary) problems (especially infection)  following:  A long period of time when you are unable to move or be active. BEFORE THE PROCEDURE   If the spirometer includes an indicator to show your best effort, your nurse or respiratory therapist will set it to a desired goal.  If possible, sit up straight or lean slightly forward. Try not to slouch.  Hold the incentive spirometer in an upright position. INSTRUCTIONS FOR USE  1. Sit on the edge of your bed if possible, or sit up as far as you can in bed or on a chair. 2. Hold the incentive spirometer in an upright position. 3. Breathe out normally. 4. Place the mouthpiece in your mouth and seal your lips tightly around it. 5. Breathe in slowly and as deeply as possible, raising the piston or the ball toward the top of the column. 6. Hold your breath for 3-5 seconds or for as long as possible. Allow the piston or ball to fall to the bottom of the column. 7. Remove the mouthpiece from your mouth and breathe out normally. 8. Rest for a few seconds and repeat Steps 1 through 7 at least 10 times every 1-2 hours when you are awake. Take your time and take a few normal breaths between deep breaths. 9. The spirometer may include an indicator to show your best effort. Use the indicator as a goal to work toward during each repetition. 10. After each set of 10 deep breaths, practice coughing to be sure your lungs are clear. If you have an incision (the cut made at the time of surgery), support your incision when coughing by placing a pillow or rolled up towels firmly against it. Once you are able to get out of bed, walk around indoors  and cough well. You may stop using the incentive spirometer when instructed by your caregiver.  RISKS AND COMPLICATIONS  Take your time so you do not get dizzy or light-headed.  If you are in pain, you may need to take or ask for pain medication before doing incentive spirometry. It is harder to take a deep breath if you are having pain. AFTER USE  Rest and  breathe slowly and easily.  It can be helpful to keep track of a log of your progress. Your caregiver can provide you with a simple table to help with this. If you are using the spirometer at home, follow these instructions: [place] IF:   You are having difficultly using the spirometer.  You have trouble using the spirometer as often as instructed.  Your pain medication is not giving enough relief while using the spirometer.  You develop fever of 100.5 F (38.1 C) or higher. SEEK IMMEDIATE MEDICAL CARE IF:   You cough up bloody sputum that had not been present before.  You develop fever of 102 F (38.9 C) or greater.  You develop worsening pain at or near the incision site. MAKE SURE YOU:   Understand these instructions.  Will watch your condition.  Will get help right away if you are not doing well or get worse. Document Released: 09/04/2006 Document Revised: 07/17/2011 Document Reviewed: 11/05/2006 [facility] Patient Information 2014 [place], [place].   ________________________________________________________________________

## 2018-06-20 NOTE — Care Plan (Signed)
Spoke with patient prior to surgery. He will discharge to home with wife and HHPT. I have ordered CPM and 3n1 from [place]. He has a rolling walker. He will have HHPT and then transition to [place] at [facility]. He is aware and agreeable with plan. Choice offered.   Discharge instructions/F2F/home health orders will need to be faxed to [facility] at [facility] at [phone].   Ladell Heads, [place]

## 2018-06-21 ENCOUNTER — Other Ambulatory Visit: Payer: Self-pay

## 2018-06-21 ENCOUNTER — Ambulatory Visit (HOSPITAL_COMMUNITY)
Admission: RE | Admit: 2018-06-21 | Discharge: 2018-06-21 | Disposition: A | Discharge status: Home / Self Care | Payer: Medicare Other | Source: Ambulatory Visit | Attending: Orthopedic Surgery | Admitting: Orthopedic Surgery

## 2018-06-21 ENCOUNTER — Encounter (HOSPITAL_COMMUNITY): Payer: Self-pay

## 2018-06-21 ENCOUNTER — Encounter (HOSPITAL_COMMUNITY)
Admission: RE | Admit: 2018-06-21 | Discharge: 2018-06-21 | Disposition: A | Discharge status: Home / Self Care | Payer: Medicare Other | Source: Ambulatory Visit | Attending: Orthopedic Surgery | Admitting: Orthopedic Surgery

## 2018-06-21 DIAGNOSIS — Z01811 Encounter for preprocedural respiratory examination: Secondary | ICD-10-CM

## 2018-06-21 LAB — URINALYSIS, ROUTINE W REFLEX MICROSCOPIC
Bilirubin Urine: NEGATIVE
Glucose, UA: NEGATIVE mg/dL
Hgb urine dipstick: NEGATIVE
Ketones, ur: NEGATIVE mg/dL
Leukocytes,Ua: NEGATIVE
Nitrite: NEGATIVE
Protein, ur: NEGATIVE mg/dL
Specific Gravity, Urine: 1.024 (ref 1.005–1.030)
pH: 5 (ref 5.0–8.0)

## 2018-06-21 LAB — CBC WITH DIFFERENTIAL/PLATELET
Abs Immature Granulocytes: 0.02 10*3/uL (ref 0.00–0.07)
Basophils Absolute: 0 10*3/uL (ref 0.0–0.1)
Basophils Relative: 0 %
Eosinophils Absolute: 0 10*3/uL (ref 0.0–0.5)
Eosinophils Relative: 0 %
HCT: 40.8 % (ref 39.0–52.0)
Hemoglobin: 13.2 g/dL (ref 13.0–17.0)
Immature Granulocytes: 1 %
Lymphocytes Relative: 53 %
Lymphs Abs: 1.6 10*3/uL (ref 0.7–4.0)
MCH: 33.7 pg (ref 26.0–34.0)
MCHC: 32.4 g/dL (ref 30.0–36.0)
MCV: 104.1 fL — ABNORMAL HIGH (ref 80.0–100.0)
Monocytes Absolute: 0.1 10*3/uL (ref 0.1–1.0)
Monocytes Relative: 4 %
Neutro Abs: 1.3 10*3/uL — ABNORMAL LOW (ref 1.7–7.7)
Neutrophils Relative %: 42 %
Platelets: 68 10*3/uL — ABNORMAL LOW (ref 150–400)
RBC: 3.92 MIL/uL — ABNORMAL LOW (ref 4.22–5.81)
RDW: 16.7 % — ABNORMAL HIGH (ref 11.5–15.5)
WBC: 3.1 10*3/uL — ABNORMAL LOW (ref 4.0–10.5)
nRBC: 0 % (ref 0.0–0.2)

## 2018-06-21 LAB — COMPREHENSIVE METABOLIC PANEL
ALT: 35 U/L (ref 0–44)
AST: 31 U/L (ref 15–41)
Albumin: 4.4 g/dL (ref 3.5–5.0)
Alkaline Phosphatase: 50 U/L (ref 38–126)
Anion gap: 9 (ref 5–15)
BUN: 41 mg/dL — ABNORMAL HIGH (ref 8–23)
CO2: 27 mmol/L (ref 22–32)
Calcium: 9.5 mg/dL (ref 8.9–10.3)
Chloride: 106 mmol/L (ref 98–111)
Creatinine, Ser: 1.62 mg/dL — ABNORMAL HIGH (ref 0.61–1.24)
GFR calc Af Amer: 48 mL/min — ABNORMAL LOW (ref >60)
GFR calc non Af Amer: 41 mL/min — ABNORMAL LOW (ref >60)
Glucose, Bld: 122 mg/dL — ABNORMAL HIGH (ref 70–99)
Potassium: 4.7 mmol/L (ref 3.5–5.1)
Sodium: 142 mmol/L (ref 135–145)
Total Bilirubin: 0.7 mg/dL (ref 0.3–1.2)
Total Protein: 6.7 g/dL (ref 6.5–8.1)

## 2018-06-21 LAB — SURGICAL PCR SCREEN
MRSA, PCR: NEGATIVE
Staphylococcus aureus: NEGATIVE

## 2018-06-21 LAB — PROTIME-INR
INR: 0.94
Prothrombin Time: 12.5 seconds (ref 11.4–15.2)

## 2018-06-21 LAB — APTT: aPTT: 29 seconds (ref 24–36)

## 2018-06-22 LAB — ABO/RH: ABO/RH(D): O POS

## 2018-06-24 ENCOUNTER — Encounter (HOSPITAL_COMMUNITY): Payer: Self-pay | Admitting: Physician Assistant

## 2018-06-24 NOTE — Progress Notes (Addendum)
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 Dorna Leitz.   Pt last seen by PCP 06/14/18, seen by Dr. Ralph Leyden.  CBC ordered at this visit, platelets 85K; decreased from 93k 06/04/2018.  Thrombocytopenia noted at PST visit 06/21/18 with platelets 68.  Left message for Dr. Shon Millet requesting call back to discuss, pending.   Discussed with Dr. Berenice Primas office, per his scheduler Dr. Berenice Primas would like hematology input before proceeding.   VS: BP 115/68   Pulse 80   Temp 36.6 C (Oral)   Resp 18   Ht 5' 9.5" (1.765 m)   Wt 101.2 kg   SpO2 97%   BMI 32.46 kg/m   PROVIDERS: [name], MD is PCP   Franchot Gallo, MD is Urologist  LABS: Labs reviewed: Acceptable for surgery. (all labs ordered are listed, but only abnormal results are displayed)  Labs Reviewed  URINALYSIS, ROUTINE W REFLEX MICROSCOPIC - Abnormal; Notable for the following components:      Result Value   Color, Urine AMBER (*)    APPearance HAZY (*)    All other components within normal limits  CBC WITH DIFFERENTIAL/PLATELET - Abnormal; Notable for the following components:   WBC 3.1 (*)    RBC 3.92 (*)    MCV 104.1 (*)    RDW 16.7 (*)    Platelets 68 (*)    Neutro Abs 1.3 (*)    All other components within normal limits  COMPREHENSIVE METABOLIC PANEL - Abnormal; Notable for the following components:   Glucose, Bld 122 (*)    BUN 41 (*)    Creatinine, Ser 1.62 (*)    GFR calc non Af Amer 41 (*)    GFR calc Af Amer 48 (*)    All other components within normal limits  SURGICAL PCR SCREEN  APTT   PROTIME-INR  TYPE AND SCREEN  ABO/RH     IMAGES: Chest Xray 06/21/2018 FINDINGS: Lungs are adequately inflated and otherwise clear. Cardiomediastinal silhouette is normal. There are mild degenerative changes of the spine. Partially visualized fusion hardware over the lumbar spine intact.  IMPRESSION: No active cardiopulmonary disease.  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 Arthritis   . 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 Cancer ([place]) 01-28-2014   skin cancer  . CKD (chronic kidney disease), stage III ([place])   . GERD (gastroesophageal reflux disease)   . Heart murmur    NOTED DURING PHYSICAL WHEN HE WAS ENLISTING IN [place] , DIDNT KNOW UNTIL THAT TIME AND REPORTS , "THATS THE LAST I HEARD ABOUT IT "   . History of hypertension    no longer issue  . History of malignant melanoma of skin    excision top of scalp 2015-- no recurrence  . History of urinary retention  post op lumbar fusion surgery 04/ 2016  . 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 Left ureteral calculus   . Seasonal allergies   . Wears glasses   . Wears glasses   . Wears partial dentures    upper and lower    Past Surgical History:  Procedure Laterality Date  . ANKLE FUSION Right 2007  . CARPAL TUNNEL RELEASE Left 12/28/2009   w/ pulley release left long finger  . CARPAL TUNNEL RELEASE Right 07/22/2013   Procedure: RIGHT CARPAL TUNNEL RELEASE;  Surgeon: Cammie Sickle., MD;  Location: [place];  Service: Orthopedics;  Laterality: Right;  . COLONOSCOPY    . CYSTO/  LEFT RETROGRADE PYELOGRAM  11/21/2010  . CYSTOSCOPY WITH STENT PLACEMENT Left  03/09/2016   Procedure: CYSTOSCOPY WITH STENT PLACEMENT;  Surgeon: Franchot Gallo, MD;  Location: [facility];  Service: Urology;  Laterality: Left;  . CYSTOSCOPY/RETROGRADE/URETEROSCOPY/STONE EXTRACTION WITH BASKET Left 03/09/2016   Procedure: CYSTOSCOPY/RETROGRADE/URETEROSCOPY/STONE EXTRACTION WITH BASKET;  Surgeon: Franchot Gallo, MD;  Location: [facility];  Service: Urology;  Laterality: Left;  . LEFT URETEROSCOPIC LASER LITHOTRIPSY STONE EXTRACTION/ STENT PLACEMENT  05/23/2010  . MOHS SURGERY     TOP OF THE HEAD   . ORIF ANKLE FRACTURE Right 1978  . POSTERIOR LUMBAR FUSION  08/21/2014   laminectomy and decompression L2 -- L5  . RIGHT LOWER LEG SURGERY  X3  1975 to 1976   including ORIF  . TONSILLECTOMY AND ADENOIDECTOMY  1986  . UMBILICAL HERNIA REPAIR  2009 approx    MEDICATIONS: . acetaminophen (TYLENOL) 500 MG tablet  . allopurinol (ZYLOPRIM) 300 MG tablet  . celecoxib (CELEBREX) 200 MG capsule  . cetirizine (ZYRTEC) 10 MG tablet  . docusate sodium (COLACE) 100 MG capsule  . fluticasone (FLONASE) 50 MCG/ACT nasal spray  . hydrochlorothiazide (MICROZIDE) 12.5 MG capsule  . lansoprazole (PREVACID) 15 MG capsule  . lisinopril (PRINIVIL,ZESTRIL) 5 MG tablet  . magnesium oxide (MAG-OX) 400 MG tablet  . Polyethyl Glycol-Propyl Glycol (SYSTANE OP)  . tamsulosin (FLOMAX) 0.4 MG CAPS capsule  . traMADol (ULTRAM) 50 MG tablet   No current facility-administered medications for this encounter.     Maia Plan [facility] Pre-Surgical Testing [phone] 06/25/18 11:38 AM

## 2018-06-28 ENCOUNTER — Encounter (HOSPITAL_COMMUNITY): Admission: RE | Payer: Self-pay | Source: Other Acute Inpatient Hospital

## 2018-06-28 ENCOUNTER — Ambulatory Visit (HOSPITAL_COMMUNITY)
Admission: RE | Admit: 2018-06-28 | Payer: Medicare Other | Source: Other Acute Inpatient Hospital | Admitting: Orthopedic Surgery

## 2018-06-28 LAB — TYPE AND SCREEN
ABO/RH(D): O POS
Antibody Screen: NEGATIVE

## 2018-06-28 SURGERY — ARTHROPLASTY, KNEE, TOTAL
Anesthesia: Spinal | Laterality: Right

## 2018-07-08 ENCOUNTER — Inpatient Hospital Stay (HOSPITAL_COMMUNITY): Payer: Medicare Other | Attending: Internal Medicine | Admitting: Internal Medicine

## 2018-07-08 ENCOUNTER — Inpatient Hospital Stay (HOSPITAL_COMMUNITY): Payer: Medicare Other

## 2018-07-08 ENCOUNTER — Encounter (HOSPITAL_COMMUNITY): Payer: Self-pay | Admitting: Internal Medicine

## 2018-07-08 ENCOUNTER — Other Ambulatory Visit: Payer: Self-pay

## 2018-07-08 VITALS — BP 94/58 | HR 93 | Temp 98.1°F | Resp 16 | Wt 224.5 lb

## 2018-07-08 DIAGNOSIS — Z79899 Other long term (current) drug therapy: Secondary | ICD-10-CM

## 2018-07-08 DIAGNOSIS — Z791 Long term (current) use of non-steroidal anti-inflammatories (NSAID): Secondary | ICD-10-CM | POA: Diagnosis not present

## 2018-07-08 DIAGNOSIS — M25559 Pain in unspecified hip: Secondary | ICD-10-CM

## 2018-07-08 DIAGNOSIS — D72818 Other decreased white blood cell count: Secondary | ICD-10-CM

## 2018-07-08 DIAGNOSIS — D696 Thrombocytopenia, unspecified: Secondary | ICD-10-CM

## 2018-07-08 DIAGNOSIS — Z8042 Family history of malignant neoplasm of prostate: Secondary | ICD-10-CM | POA: Diagnosis not present

## 2018-07-08 DIAGNOSIS — D61818 Other pancytopenia: Secondary | ICD-10-CM | POA: Diagnosis not present

## 2018-07-08 DIAGNOSIS — I959 Hypotension, unspecified: Secondary | ICD-10-CM | POA: Diagnosis not present

## 2018-07-08 DIAGNOSIS — Z87891 Personal history of nicotine dependence: Secondary | ICD-10-CM | POA: Diagnosis not present

## 2018-07-08 DIAGNOSIS — N183 Chronic kidney disease, stage 3 (moderate): Secondary | ICD-10-CM | POA: Diagnosis not present

## 2018-07-08 DIAGNOSIS — Z85820 Personal history of malignant melanoma of skin: Secondary | ICD-10-CM | POA: Diagnosis not present

## 2018-07-08 DIAGNOSIS — D7589 Other specified diseases of blood and blood-forming organs: Secondary | ICD-10-CM

## 2018-07-08 LAB — CBC WITH DIFFERENTIAL/PLATELET
Abs Immature Granulocytes: 0.01 10*3/uL (ref 0.00–0.07)
Basophils Absolute: 0 10*3/uL (ref 0.0–0.1)
Basophils Relative: 0 %
Eosinophils Absolute: 0 10*3/uL (ref 0.0–0.5)
Eosinophils Relative: 0 %
HCT: 36.4 % — ABNORMAL LOW (ref 39.0–52.0)
Hemoglobin: 11.7 g/dL — ABNORMAL LOW (ref 13.0–17.0)
Immature Granulocytes: 0 %
Lymphocytes Relative: 52 %
Lymphs Abs: 1.3 10*3/uL (ref 0.7–4.0)
MCH: 33.1 pg (ref 26.0–34.0)
MCHC: 32.1 g/dL (ref 30.0–36.0)
MCV: 103.1 fL — ABNORMAL HIGH (ref 80.0–100.0)
Monocytes Absolute: 0.1 10*3/uL (ref 0.1–1.0)
Monocytes Relative: 4 %
Neutro Abs: 1.1 10*3/uL — ABNORMAL LOW (ref 1.7–7.7)
Neutrophils Relative %: 44 %
Platelets: 53 10*3/uL — ABNORMAL LOW (ref 150–400)
RBC: 3.53 MIL/uL — ABNORMAL LOW (ref 4.22–5.81)
RDW: 17.5 % — ABNORMAL HIGH (ref 11.5–15.5)
WBC: 2.5 10*3/uL — ABNORMAL LOW (ref 4.0–10.5)
nRBC: 0 % (ref 0.0–0.2)

## 2018-07-08 LAB — COMPREHENSIVE METABOLIC PANEL
ALT: 28 U/L (ref 0–44)
AST: 25 U/L (ref 15–41)
Albumin: 4.2 g/dL (ref 3.5–5.0)
Alkaline Phosphatase: 45 U/L (ref 38–126)
Anion gap: 6 (ref 5–15)
BUN: 39 mg/dL — ABNORMAL HIGH (ref 8–23)
CO2: 25 mmol/L (ref 22–32)
Calcium: 9.4 mg/dL (ref 8.9–10.3)
Chloride: 107 mmol/L (ref 98–111)
Creatinine, Ser: 1.59 mg/dL — ABNORMAL HIGH (ref 0.61–1.24)
GFR calc Af Amer: 49 mL/min — ABNORMAL LOW (ref >60)
GFR calc non Af Amer: 42 mL/min — ABNORMAL LOW (ref >60)
Glucose, Bld: 111 mg/dL — ABNORMAL HIGH (ref 70–99)
Potassium: 4.9 mmol/L (ref 3.5–5.1)
Sodium: 138 mmol/L (ref 135–145)
Total Bilirubin: 0.7 mg/dL (ref 0.3–1.2)
Total Protein: 6.7 g/dL (ref 6.5–8.1)

## 2018-07-08 LAB — APTT: aPTT: 30 seconds (ref 24–36)

## 2018-07-08 LAB — LACTATE DEHYDROGENASE: LDH: 141 U/L (ref 98–192)

## 2018-07-08 LAB — FOLATE: Folate: 20.7 ng/mL (ref >5.9)

## 2018-07-08 LAB — FERRITIN: Ferritin: 429 ng/mL — ABNORMAL HIGH (ref 24–336)

## 2018-07-08 LAB — PROTIME-INR
INR: 1 (ref 0.8–1.2)
Prothrombin Time: 13.3 seconds (ref 11.4–15.2)

## 2018-07-08 LAB — C-REACTIVE PROTEIN: CRP: 1.5 mg/dL — ABNORMAL HIGH (ref <1.0)

## 2018-07-08 LAB — SEDIMENTATION RATE: Sed Rate: 14 mm/hr (ref 0–16)

## 2018-07-08 LAB — VITAMIN B12: Vitamin B-12: 2561 pg/mL — ABNORMAL HIGH (ref 180–914)

## 2018-07-08 NOTE — Progress Notes (Signed)
Called and left message with, Richard Wallace, Dr. Roosvelt Harps nurse that the pt's BP was low today and that Dr. Walden Field advised him to hold his BP medication and to follow up with his PCP.

## 2018-07-08 NOTE — Progress Notes (Signed)
Referring Physician:  Dr. Berenice Primas  Diagnosis Thrombocytopenia ([place]) - Plan: CBC with Differential, Comprehensive metabolic panel, Lactate dehydrogenase, Hepatitis B core antibody, total, Hepatitis B surface antibody,qualitative, Hepatitis B surface antigen, Hepatitis panel, acute, Hepatitis C RNA quantitative, HIV Antibody (routine testing w rflx), ANA, IFA (with reflex), Sedimentation rate, C-reactive protein, Rheumatoid factor, Protein electrophoresis, serum, Protime-INR, APTT, Draw extra clot tube, Vitamin B12, Folate, Methylmalonic acid, serum, Ferritin, Pathologist smear review  Other decreased white blood cell (WBC) count - Plan: CBC with Differential, Comprehensive metabolic panel, Lactate dehydrogenase, Hepatitis B core antibody, total, Hepatitis B surface antibody,qualitative, Hepatitis B surface antigen, Hepatitis panel, acute, Hepatitis C RNA quantitative, HIV Antibody (routine testing w rflx), ANA, IFA (with reflex), Sedimentation rate, C-reactive protein, Rheumatoid factor, Protein electrophoresis, serum, Protime-INR, APTT, Draw extra clot tube, Vitamin B12, Folate, Methylmalonic acid, serum, Ferritin, Pathologist smear review  Pain in joint involving pelvic region and thigh, unspecified laterality - Plan: CBC with Differential, Comprehensive metabolic panel, Lactate dehydrogenase, Hepatitis B core antibody, total, Hepatitis B surface antibody,qualitative, Hepatitis B surface antigen, Hepatitis panel, acute, Hepatitis C RNA quantitative, HIV Antibody (routine testing w rflx), ANA, IFA (with reflex), Sedimentation rate, C-reactive protein, Rheumatoid factor, Protein electrophoresis, serum, Protime-INR, APTT, Draw extra clot tube, Vitamin B12, Folate, Methylmalonic acid, serum, Ferritin  Staging Cancer Staging No matching staging information was found for the patient.  Assessment and Plan:  1.  Leukopenia and thrombocytopenia.  73 year old male undergoing evaluation for knee replacement.  He  had labs done 06/21/2018 that showed WBC 3.1 HB 13.2 plts 68,000.  MCV 104.  Chemistries WNL with K+ 4.7 Cr 1.62 and normal LFTs.  Labs done 08/26/2014 showed WBC 11.3 HB 11.6 plts 298,000.  Pt reports this is the first time he was told his labs were abnormal.  He recently started HCTZ.  He denies taking ASA, Advil, Aleve, or Motrin.  He uses tramodol.  Pt has a new bruise on left upper abdomen.  He reported minor nose bleed this weekend.  Pt denies any family history of leukemias or lymphomas.  Father had prostate cancer.  He denies fevers, chills, night sweats, weight loss and has noted no adenopathy.  His father had prostate cancer.  Pt is seen today for consultation due to thrombocytopenia and leukopenia.    Labs done today 07/08/2018 reviewed and showed WBC 2.5 HB 11.7 MCV 103 plts 53,000.  52% lymphocytes.  No fragmentation noted.  Chemistries WNL with K+ 4.9 normal LFTs.  LDH 141.  Sed rate WNL at 14.  PT 13.3 PTT 30. Awaiting Hepatitis B core antibody, total, Hepatitis B surface antibody,qualitative, Hepatitis B surface antigen, Hepatitis panel, acute, Hepatitis C RNA quantitative, HIV Antibody (routine testing w rflx), ANA, IFA (with reflex), Smear review and flow cytometry results.    Pt recently started HCTZ and there is an association with diuretics and pancytopenia.  Due to hypotension I have requested the pt hold HCTZ and follow-up with PCP to determine if additional BP medication indicated. I have asked he avoid NSAIDs and ASA.   If labs studies unrevealing and no improvement in counts, may have to consider additional work-up.  Usually platelet count > 50,000 is considered adequate for the majority of surgeries.    2.  Hypotension.  BP is 94/58. Due to hypotension I have requested the pt hold HCTZ and follow-up with PCP to determine if additional BP medication indicated.    3.  RI. CR 1.62.  SPEP pending.  Pt has normal LDH and  no fragmentation noted.  Pt will follow-up to go over results.    4.   Joint pain.  Awaiting C-reactive protein, Rheumatoid factor, Protein electrophoresis, serum.  Pt will RTC to go over results.   5  Macrocytosis.  HB 11.7.  Awaiting Vitamin B12, Folate, Methylmalonic acid, serum, Ferritin.  Pt will RTC to go over results.    6.  Bruise.  Pt has minor bruise on upper abdomen.  Pt should avoid NSAIDS.  PLT count adequate at 53,000.    40 minutes spent with more than 50% spent in review of records, counseling and coordination of care.    HPI:  73 year old male undergoing evaluation for knee replacement.  He had labs done 06/21/2018 that showed WBC 3.1 HB 13.2 plts 68,000.  MCV 104.  Chemistries WNL with K+ 4.7 Cr 1.62 and normal LFTs.  Labs done 08/26/2014 showed WBC 11.3 HB 11.6 plts 298,000.  Pt reports this is the first time he was told his labs were abnormal.  He recently started HCTZ.  He denies taking ASA, Advil, Aleve, or Motrin.  He uses tramodol.  Pt has a new bruise on left upper abdomen.  He reported minor nose bleed this weekend.  Pt denies any family history of leukemias or lymphomas.  Father had prostate cancer.  He denies fevers, chills, night sweats, weight loss and has noted no adenopathy.  His father had prostate cancer.  Pt is seen today for consultation due to thrombocytopenia and leukopenia.    Problem List Patient Active Problem List   Diagnosis Date Noted  . Atelectasis [J98.11]   . Hypoxemia [R09.02]   . Essential hypertension [I10]   . Lumbar scoliosis [M41.9] 08/21/2014    Past Medical History Past Medical History:  Diagnosis Date  . Arthritis   . 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 Cancer ([place]) 01-28-2014   skin cancer  . CKD (chronic kidney disease), stage III ([place])   . GERD (gastroesophageal reflux disease)   . Heart murmur    NOTED DURING PHYSICAL WHEN HE WAS ENLISTING IN [place] , DIDNT KNOW UNTIL THAT TIME AND REPORTS , "THATS THE LAST I HEARD ABOUT IT "   . History of hypertension    no longer issue  .  History of malignant melanoma of skin    excision top of scalp 2015-- no recurrence  . History of urinary retention    post op lumbar fusion surgery 04/ 2016  . 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 Left ureteral calculus   . Seasonal allergies   . Wears glasses   . Wears glasses   . Wears partial dentures    upper and lower    Past Surgical History Past Surgical History:  Procedure Laterality Date  . ANKLE FUSION Right 2007  . CARPAL TUNNEL RELEASE Left 12/28/2009   w/ pulley release left long finger  . CARPAL TUNNEL RELEASE Right 07/22/2013   Procedure: RIGHT CARPAL TUNNEL RELEASE;  Surgeon: Cammie Sickle., MD;  Location: [facility];  Service: Orthopedics;  Laterality: Right;  . COLONOSCOPY    . CYSTO/  LEFT RETROGRADE PYELOGRAM  11/21/2010  . CYSTOSCOPY WITH STENT PLACEMENT Left 03/09/2016   Procedure: CYSTOSCOPY WITH STENT PLACEMENT;  Surgeon: Franchot Gallo, MD;  Location: [facility];  Service: Urology;  Laterality: Left;  . CYSTOSCOPY/RETROGRADE/URETEROSCOPY/STONE EXTRACTION WITH BASKET Left 03/09/2016  Procedure: CYSTOSCOPY/RETROGRADE/URETEROSCOPY/STONE EXTRACTION WITH BASKET;  Surgeon: Franchot Gallo, MD;  Location: [facility];  Service: Urology;  Laterality: Left;  . LEFT URETEROSCOPIC LASER LITHOTRIPSY STONE EXTRACTION/ STENT PLACEMENT  05/23/2010  . MOHS SURGERY     TOP OF THE HEAD   . ORIF ANKLE FRACTURE Right 1978  . POSTERIOR LUMBAR FUSION  08/21/2014   laminectomy and decompression L2 -- L5  . RIGHT LOWER LEG SURGERY  X3  1975 to 1976   including ORIF  . TONSILLECTOMY AND ADENOIDECTOMY  1986  . UMBILICAL HERNIA REPAIR  2009 approx    Family History Family History  Problem Relation Age of Onset  . Stroke Mother   . Prostate cancer Father   . Bone cancer Father   . Rheum arthritis Sister   . Urinary tract infection Sister       Social History  reports that he quit smoking about 31 years ago. His smoking use included cigarettes. He quit after 20.00 years of use. He has never used smokeless tobacco. He reports current alcohol use of about 7.0 - 14.0 standard drinks of alcohol per week. He reports that he does not use drugs.  Medications  Current Outpatient Medications:  .  acetaminophen (TYLENOL) 500 MG tablet, Take 1,000 mg by mouth every 6 (six) hours as needed for mild pain or moderate pain. , Disp: , Rfl:  .  allopurinol (ZYLOPRIM) 300 MG tablet, Take 300 mg by mouth every evening., Disp: , Rfl:  .  celecoxib (CELEBREX) 200 MG capsule, Take 200 mg by mouth daily., Disp: , Rfl:  .  cetirizine (ZYRTEC) 10 MG tablet, Take 10 mg by mouth daily., Disp: , Rfl:  .  diclofenac sodium (VOLTAREN) 1 % GEL, , Disp: , Rfl:  .  docusate sodium (COLACE) 100 MG capsule, Take 100 mg by mouth daily. , Disp: , Rfl:  .  fluticasone (FLONASE) 50 MCG/ACT nasal spray, Place 1 spray into both nostrils daily., Disp: , Rfl:  .  hydrochlorothiazide (HYDRODIURIL) 12.5 MG tablet, TAKE 1 TABLET BY MOUTH ONCE DAILY IN THE MORNING, Disp: , Rfl:  .  lansoprazole (PREVACID) 15 MG capsule, Take 15 mg by mouth every morning. , Disp: , Rfl:  .  lisinopril (PRINIVIL,ZESTRIL) 5 MG tablet, Take 5 mg by mouth daily., Disp: , Rfl:  .  magnesium oxide (MAG-OX) 400 MG tablet, Take 400 mg by mouth at bedtime. , Disp: , Rfl:  .  Polyethyl Glycol-Propyl Glycol (SYSTANE OP), Place 1 drop into both eyes 2 (two) times daily., Disp: , Rfl:  .  tamsulosin (FLOMAX) 0.4 MG CAPS capsule, Take 0.4 mg by mouth daily after supper., Disp: , Rfl:  .  traMADol (ULTRAM) 50 MG tablet, Take 50 mg by mouth 3 (three) times daily., Disp: , Rfl:   Allergies Patient has no known allergies.  Review of Systems Review of Systems - Oncology ROS negative other than joint pain and bruise on abdomen.     Physical Exam  Vitals Wt Readings from Last 3 Encounters:  07/08/18 224  lb 8 oz (101.8 kg)  06/21/18 223 lb (101.2 kg)  03/09/16 218 lb 5 oz (99 kg)   Temp Readings from Last 3 Encounters:  07/08/18 98.1 F (36.7 C) (Oral)  06/21/18 97.8 F (36.6 C) (Oral)  03/09/16 98.2 F (36.8 C) (Oral)   BP Readings from Last 3 Encounters:  07/08/18 (!) 94/58  06/21/18 115/68  03/09/16 139/87   Pulse Readings from Last 3 Encounters:  07/08/18 93  06/21/18 80  03/09/16 81   Constitutional: Well-developed, well-nourished, and in no distress.   HENT: Head: Normocephalic and atraumatic.  Mouth/Throat: No oropharyngeal exudate. Mucosa moist. Eyes: Pupils are equal, round, and reactive to light. Conjunctivae are normal. No scleral icterus.  Neck: Normal range of motion. Neck supple. No JVD present.  Cardiovascular: Normal rate, regular rhythm and normal heart sounds.  Exam reveals no gallop and no friction rub.   No murmur heard. Pulmonary/Chest: Effort normal and breath sounds normal. No respiratory distress. No wheezes.No rales.  Abdominal: Soft. Bowel sounds are normal. No distension. There is no tenderness. There is no guarding.  Musculoskeletal: No edema or tenderness.  Lymphadenopathy: No cervical,axillary or supraclavicular adenopathy.  Neurological: Alert and oriented to person, place, and time. No cranial nerve deficit.  Skin: Skin is warm and dry. No rash noted. No erythema. No pallor.  No petechia noted.  Bruise noted on left upper abdomen.   Psychiatric: Affect and judgment normal.   Labs Appointment on 07/08/2018  Component Date Value Ref Range Status  . WBC 07/08/2018 2.5* 4.0 - 10.5 K/uL Final  . RBC 07/08/2018 3.53* 4.22 - 5.81 MIL/uL Final  . Hemoglobin 07/08/2018 11.7* 13.0 - 17.0 g/dL Final  . HCT 07/08/2018 36.4* 39.0 - 52.0 % Final  . MCV 07/08/2018 103.1* 80.0 - 100.0 fL Final  . MCH 07/08/2018 33.1  26.0 - 34.0 pg Final  . MCHC 07/08/2018 32.1  30.0 - 36.0 g/dL Final  . RDW 07/08/2018 17.5* 11.5 - 15.5 % Final  . Platelets 07/08/2018  53* 150 - 400 K/uL Final   Comment: PLATELET COUNT CONFIRMED BY SMEAR SPECIMEN CHECKED FOR CLOTS Immature Platelet Fraction may be clinically indicated, consider ordering this additional test [id number]   . nRBC 07/08/2018 0.0  0.0 - 0.2 % Final  . Neutrophils Relative % 07/08/2018 44  % Final  . Neutro Abs 07/08/2018 1.1* 1.7 - 7.7 K/uL Final  . Lymphocytes Relative 07/08/2018 52  % Final  . Lymphs Abs 07/08/2018 1.3  0.7 - 4.0 K/uL Final  . Monocytes Relative 07/08/2018 4  % Final  . Monocytes Absolute 07/08/2018 0.1  0.1 - 1.0 K/uL Final  . Eosinophils Relative 07/08/2018 0  % Final  . Eosinophils Absolute 07/08/2018 0.0  0.0 - 0.5 K/uL Final  . Basophils Relative 07/08/2018 0  % Final  . Basophils Absolute 07/08/2018 0.0  0.0 - 0.1 K/uL Final  . Immature Granulocytes 07/08/2018 0  % Final  . Abs Immature Granulocytes 07/08/2018 0.01  0.00 - 0.07 K/uL Final   Performed at [facility], [address] [place], [place] [address]  . Sodium 07/08/2018 138  135 - 145 mmol/L Final  . Potassium 07/08/2018 4.9  3.5 - 5.1 mmol/L Final  . Chloride 07/08/2018 107  98 - 111 mmol/L Final  . CO2 07/08/2018 25  22 - 32 mmol/L Final  . Glucose, Bld 07/08/2018 111* 70 - 99 mg/dL Final  . BUN 07/08/2018 39* 8 - 23 mg/dL Final  . Creatinine, Ser 07/08/2018 1.59* 0.61 - 1.24 mg/dL Final  . Calcium 07/08/2018 9.4  8.9 - 10.3 mg/dL Final  . Total Protein 07/08/2018 6.7  6.5 - 8.1 g/dL Final  . Albumin 07/08/2018 4.2  3.5 - 5.0 g/dL Final  . AST 07/08/2018 25  15 - 41 U/L Final  . ALT 07/08/2018 28  0 - 44 U/L Final  . Alkaline Phosphatase 07/08/2018 45  38 - 126 U/L Final  . Total Bilirubin 07/08/2018 0.7  0.3 - 1.2 mg/dL Final  . GFR calc non Af Amer 07/08/2018 42* >60 mL/min Final  . GFR calc Af Amer 07/08/2018 49* >60 mL/min Final  . Anion gap 07/08/2018 6  5 - 15 Final   Performed at [facility], [address], [place], [place] [address]  . LDH 07/08/2018 141  98 - 192 U/L Final   Performed  at [facility], [address], [place], [place] [address]  . Sed Rate 07/08/2018 14  0 - 16 mm/hr Final   Performed at [facility], [address], [place], [place] [address]  . Prothrombin Time 07/08/2018 13.3  11.4 - 15.2 seconds Final  . INR 07/08/2018 1.0  0.8 - 1.2 Final   Comment: (NOTE) INR goal varies based on device and disease states. Performed at [facility], [address] [place], [place] [address]   . aPTT 07/08/2018 30  24 - 36 seconds Final   Performed at [facility], [address] [place], [place] [address]  . Folate 07/08/2018 20.7  >5.9 ng/mL Final   Performed at [facility], [address], [place], [place] [address]  . Ferritin 07/08/2018 429* 24 - 336 ng/mL Final   Performed at [facility], [address] [place], [place] [address]     Pathology Orders Placed This Encounter  Procedures  . CBC with Differential    Standing Status:   Future    Number of Occurrences:   1    Standing Expiration Date:   07/08/2019  . Comprehensive metabolic panel    Standing Status:   Future    Number of Occurrences:   1    Standing Expiration Date:   07/08/2019  . Lactate dehydrogenase    Standing Status:   Future    Number of Occurrences:   1    Standing Expiration Date:   07/08/2019  . Hepatitis B core antibody, total    Standing Status:   Future    Number of Occurrences:   1    Standing Expiration Date:   07/08/2019  . Hepatitis B surface antibody,qualitative    Standing Status:   Future    Number of Occurrences:   1    Standing Expiration Date:   07/08/2019  . Hepatitis B surface antigen    Standing Status:   Future    Number of Occurrences:   1    Standing Expiration Date:   07/08/2019  . Hepatitis panel, acute    Standing Status:   Future    Number of Occurrences:   1    Standing Expiration Date:   07/08/2019  . Hepatitis C RNA quantitative    Standing Status:   Future    Number of Occurrences:   1    Standing Expiration Date:   07/08/2019  . HIV Antibody (routine  testing w rflx)    Standing Status:   Future    Number of Occurrences:   1    Standing Expiration Date:   07/08/2019  . ANA, IFA (with reflex)    Standing Status:   Future    Number of Occurrences:   1    Standing Expiration Date:   07/08/2019  . Sedimentation rate    Standing Status:   Future    Number of Occurrences:   1    Standing Expiration Date:   07/08/2019  . C-reactive protein    Standing Status:   Future    Number of Occurrences:   1  Standing Expiration Date:   07/08/2019  . Rheumatoid factor    Standing Status:   Future    Number of Occurrences:   1    Standing Expiration Date:   07/08/2019  . Protein electrophoresis, serum    Standing Status:   Future    Number of Occurrences:   1    Standing Expiration Date:   07/08/2019  . Protime-INR    Standing Status:   Future    Number of Occurrences:   1    Standing Expiration Date:   07/08/2019  . APTT    Standing Status:   Future    Number of Occurrences:   1    Standing Expiration Date:   07/08/2019  . Draw extra clot tube    For possible flow cytometry    Standing Status:   Future    Number of Occurrences:   1    Standing Expiration Date:   07/08/2019  . Vitamin B12    Standing Status:   Future    Number of Occurrences:   1    Standing Expiration Date:   07/08/2019  . Folate    Standing Status:   Future    Number of Occurrences:   1    Standing Expiration Date:   07/08/2019  . Methylmalonic acid, serum    Standing Status:   Future    Number of Occurrences:   1    Standing Expiration Date:   07/08/2019  . Ferritin    Standing Status:   Future    Number of Occurrences:   1    Standing Expiration Date:   07/08/2019  . Pathologist smear review    Standing Status:   Future    Standing Expiration Date:   07/08/2019       Zoila Shutter MD

## 2018-07-09 LAB — ANTINUCLEAR ANTIBODIES, IFA: ANA Ab, IFA: POSITIVE — AB

## 2018-07-09 LAB — PROTEIN ELECTROPHORESIS, SERUM
A/G Ratio: 1.6 (ref 0.7–1.7)
Albumin ELP: 3.8 g/dL (ref 2.9–4.4)
Alpha-1-Globulin: 0.2 g/dL (ref 0.0–0.4)
Alpha-2-Globulin: 0.6 g/dL (ref 0.4–1.0)
Beta Globulin: 1 g/dL (ref 0.7–1.3)
Gamma Globulin: 0.6 g/dL (ref 0.4–1.8)
Globulin, Total: 2.4 g/dL (ref 2.2–3.9)
Total Protein ELP: 6.2 g/dL (ref 6.0–8.5)

## 2018-07-09 LAB — RHEUMATOID FACTOR: Rheumatoid fact SerPl-aCnc: <10 IU/mL (ref 0.0–13.9)

## 2018-07-09 LAB — HIV ANTIBODY (ROUTINE TESTING W REFLEX): HIV Screen 4th Generation wRfx: NONREACTIVE

## 2018-07-09 LAB — HEPATITIS B SURFACE ANTIBODY,QUALITATIVE: Hep B S Ab: REACTIVE

## 2018-07-09 LAB — HEPATITIS PANEL, ACUTE
HCV Ab: <0.1 s/co ratio (ref 0.0–0.9)
Hep A IgM: NEGATIVE
Hep B C IgM: NEGATIVE
Hepatitis B Surface Ag: NEGATIVE

## 2018-07-09 LAB — HEPATITIS B SURFACE ANTIGEN: Hepatitis B Surface Ag: NEGATIVE

## 2018-07-09 LAB — HEPATITIS B CORE ANTIBODY, TOTAL: Hep B Core Total Ab: POSITIVE — AB

## 2018-07-09 LAB — PATHOLOGIST SMEAR REVIEW

## 2018-07-09 LAB — FANA STAINING PATTERNS
Homogeneous Pattern: 1:160 {titer} — ABNORMAL HIGH
Speckled Pattern: 1:160 {titer} — ABNORMAL HIGH

## 2018-07-12 LAB — METHYLMALONIC ACID, SERUM: Methylmalonic Acid, Quantitative: 561 nmol/L — ABNORMAL HIGH (ref 0–378)

## 2018-07-25 ENCOUNTER — Inpatient Hospital Stay (HOSPITAL_BASED_OUTPATIENT_CLINIC_OR_DEPARTMENT_OTHER): Payer: Medicare Other | Admitting: Hematology

## 2018-07-25 ENCOUNTER — Telehealth (HOSPITAL_COMMUNITY): Payer: Self-pay

## 2018-07-25 ENCOUNTER — Encounter (HOSPITAL_COMMUNITY): Payer: Self-pay | Admitting: Hematology

## 2018-07-25 ENCOUNTER — Other Ambulatory Visit: Payer: Self-pay

## 2018-07-25 VITALS — BP 114/62 | HR 87 | Temp 97.7°F | Resp 17 | Wt 228.2 lb

## 2018-07-25 DIAGNOSIS — N183 Chronic kidney disease, stage 3 (moderate): Secondary | ICD-10-CM

## 2018-07-25 DIAGNOSIS — I959 Hypotension, unspecified: Secondary | ICD-10-CM

## 2018-07-25 DIAGNOSIS — D61818 Other pancytopenia: Secondary | ICD-10-CM | POA: Diagnosis not present

## 2018-07-25 DIAGNOSIS — D7589 Other specified diseases of blood and blood-forming organs: Secondary | ICD-10-CM

## 2018-07-25 DIAGNOSIS — Z85820 Personal history of malignant melanoma of skin: Secondary | ICD-10-CM

## 2018-07-25 DIAGNOSIS — Z791 Long term (current) use of non-steroidal anti-inflammatories (NSAID): Secondary | ICD-10-CM

## 2018-07-25 DIAGNOSIS — Z79899 Other long term (current) drug therapy: Secondary | ICD-10-CM

## 2018-07-25 DIAGNOSIS — D696 Thrombocytopenia, unspecified: Secondary | ICD-10-CM

## 2018-07-25 DIAGNOSIS — Z8042 Family history of malignant neoplasm of prostate: Secondary | ICD-10-CM

## 2018-07-25 DIAGNOSIS — Z87891 Personal history of nicotine dependence: Secondary | ICD-10-CM

## 2018-07-25 NOTE — Progress Notes (Signed)
[facility] [address] [place] [address]   CLINIC:  Medical Oncology/Hematology  PCP:  [name], MD [address] [place] [address] [phone]   REASON FOR VISIT:  Follow-up for Thrombocytopenia      INTERVAL HISTORY:  [name] 73 y.o. male returns for routine follow-up. He is here today with his wife Remo Lipps. He states that has experienced some fatigue lately. He states that his weakness and dizziness has gotten worse. Denies any nausea, vomiting, or diarrhea. Denies any new pains. Had not noticed any recent bleeding such as epistaxis, hematuria or hematochezia. Denies recent chest pain on exertion, shortness of breath on minimal exertion, pre-syncopal episodes, or palpitations. Denies any numbness or tingling in hands or feet. Denies any recent fevers, infections, or recent hospitalizations. Patient reports appetite at 100% and energy level at 25%.    REVIEW OF SYSTEMS:  Review of Systems  Constitutional: Positive for fatigue.  Neurological: Positive for dizziness.     PAST MEDICAL/SURGICAL HISTORY:  Past Medical History:  Diagnosis Date  . Arthritis   . 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 Cancer ([place]) 01-28-2014   skin cancer  . CKD (chronic kidney disease), stage III ([place])   . GERD (gastroesophageal reflux disease)   . Heart murmur    NOTED DURING PHYSICAL WHEN HE WAS ENLISTING IN MILITARY , DIDNT KNOW UNTIL THAT TIME AND REPORTS , "THATS THE LAST I HEARD ABOUT IT "   . History of hypertension    no longer issue  . History of malignant melanoma of skin    excision top of scalp 2015-- no recurrence  . History of urinary retention    post op lumbar fusion surgery 04/ 2016  . 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 Left ureteral calculus   . Seasonal allergies   . Wears glasses   . Wears glasses   . Wears partial dentures    upper and lower   Past Surgical History:  Procedure Laterality Date  . ANKLE FUSION Right 2007  . CARPAL TUNNEL RELEASE Left 12/28/2009   w/ pulley release left long finger  . CARPAL TUNNEL RELEASE Right 07/22/2013   Procedure: RIGHT CARPAL TUNNEL RELEASE;  Surgeon: Cammie Sickle., MD;  Location: [facility];  Service: Orthopedics;  Laterality: Right;  . COLONOSCOPY    . CYSTO/  LEFT RETROGRADE PYELOGRAM  11/21/2010  . CYSTOSCOPY WITH STENT PLACEMENT Left 03/09/2016   Procedure: CYSTOSCOPY WITH STENT PLACEMENT;  Surgeon: Franchot Gallo, MD;  Location[facility];  Service: Urology;  Laterality: Left;  . CYSTOSCOPY/RETROGRADE/URETEROSCOPY/STONE EXTRACTION WITH BASKET Left 03/09/2016   Procedure: CYSTOSCOPY/RETROGRADE/URETEROSCOPY/STONE EXTRACTION WITH BASKET;  Surgeon: Franchot Gallo, MD;  Location: [facility];  Service: Urology;  Laterality: Left;  . LEFT URETEROSCOPIC LASER LITHOTRIPSY STONE EXTRACTION/ STENT PLACEMENT  05/23/2010  . MOHS SURGERY     TOP OF THE HEAD   . ORIF ANKLE FRACTURE Right 1978  . POSTERIOR LUMBAR FUSION  08/21/2014   laminectomy and decompression L2 -- L5  . RIGHT LOWER LEG SURGERY  X3  1975 to 1976   including ORIF  . TONSILLECTOMY AND ADENOIDECTOMY  1986  . UMBILICAL HERNIA REPAIR  2009 approx     SOCIAL HISTORY:  Social History   Socioeconomic History  . Marital status: Married    Spouse name: Not on file  .  Number of children: Not on file  . Years of education: Not on file  . Highest education level: Not on file  Occupational History  . Not on file  Social Needs  . Financial resource strain: Not on file  . Food insecurity:    Worry: Not on file    Inability: Not on file  . Transportation needs:    Medical: Not on file    Non-medical: Not on file  Tobacco Use  . Smoking status: Former Smoker    Years: 20.00    Types: Cigarettes    Last attempt to quit: 07/17/1986    Years since quitting: 32.0  .  Smokeless tobacco: Never Used  Substance and Sexual Activity  . Alcohol use: Yes    Alcohol/week: 7.0 - 14.0 standard drinks    Types: 7 - 14 Cans of beer per week    Comment: 1 -2 beer daily  . Drug use: No  . Sexual activity: Not on file  Lifestyle  . Physical activity:    Days per week: Not on file    Minutes per session: Not on file  . Stress: Not on file  Relationships  . Social connections:    Talks on phone: Not on file    Gets together: Not on file    Attends religious service: Not on file    Active member of club or organization: Not on file    Attends meetings of clubs or organizations: Not on file    Relationship status: Not on file  . Intimate partner violence:    Fear of current or ex partner: Not on file    Emotionally abused: Not on file    Physically abused: Not on file    Forced sexual activity: Not on file  Other Topics Concern  . Not on file  Social History Narrative  . Not on file    FAMILY HISTORY:  Family History  Problem Relation Age of Onset  . Stroke Mother   . Prostate cancer Father   . Bone cancer Father   . Rheum arthritis Sister   . Urinary tract infection Sister     CURRENT MEDICATIONS:  Outpatient Encounter Medications as of 07/25/2018  Medication Sig  . acetaminophen (TYLENOL) 500 MG tablet Take 1,000 mg by mouth every 6 (six) hours as needed for mild pain or moderate pain.   Marland Kitchen allopurinol (ZYLOPRIM) 300 MG tablet Take 300 mg by mouth every evening.  . celecoxib (CELEBREX) 200 MG capsule Take 200 mg by mouth daily.  . cetirizine (ZYRTEC) 10 MG tablet Take 10 mg by mouth daily.  . diclofenac sodium (VOLTAREN) 1 % GEL   . docusate sodium (COLACE) 100 MG capsule Take 100 mg by mouth daily.   . fluticasone (FLONASE) 50 MCG/ACT nasal spray Place 1 spray into both nostrils daily.  . lansoprazole (PREVACID) 15 MG capsule Take 15 mg by mouth every morning.   Marland Kitchen lisinopril (PRINIVIL,ZESTRIL) 5 MG tablet Take 5 mg by mouth daily.  . magnesium  oxide (MAG-OX) 400 MG tablet Take 400 mg by mouth at bedtime.   Vladimir Faster Glycol-Propyl Glycol (SYSTANE OP) Place 1 drop into both eyes 2 (two) times daily.  . tamsulosin (FLOMAX) 0.4 MG CAPS capsule Take 0.4 mg by mouth daily after supper.  . traMADol (ULTRAM) 50 MG tablet Take 50 mg by mouth 3 (three) times daily.  . [DISCONTINUED] hydrochlorothiazide (HYDRODIURIL) 12.5 MG tablet TAKE 1 TABLET BY MOUTH ONCE DAILY IN THE MORNING  No facility-administered encounter medications on file as of 07/25/2018.     ALLERGIES:  No Known Allergies   PHYSICAL EXAM:  ECOG Performance status: 1  Vitals:   07/25/18 1032  BP: 114/62  Pulse: 87  Resp: 17  Temp: 97.7 F (36.5 C)  SpO2: 95%   Filed Weights   07/25/18 1032  Weight: 228 lb 3.2 oz (103.5 kg)    Physical Exam Constitutional:      Appearance: Normal appearance.  Cardiovascular:     Rate and Rhythm: Normal rate and regular rhythm.     Heart sounds: Normal heart sounds.  Pulmonary:     Effort: Pulmonary effort is normal.     Breath sounds: Normal breath sounds.  Abdominal:     Palpations: Abdomen is soft. There is no mass.  Musculoskeletal:     Left lower leg: No edema.  Skin:    General: Skin is warm.  Neurological:     General: No focal deficit present.     Mental Status: He is alert and oriented to person, place, and time.  Psychiatric:        Mood and Affect: Mood normal.        Behavior: Behavior normal.      LABORATORY DATA:  I have reviewed the labs as listed.  CBC    Component Value Date/Time   WBC 2.5 (L) 07/08/2018 1355   RBC 3.53 (L) 07/08/2018 1355   HGB 11.7 (L) 07/08/2018 1355   HCT 36.4 (L) 07/08/2018 1355   PLT 53 (L) 07/08/2018 1355   MCV 103.1 (H) 07/08/2018 1355   MCH 33.1 07/08/2018 1355   MCHC 32.1 07/08/2018 1355   RDW 17.5 (H) 07/08/2018 1355   LYMPHSABS 1.3 07/08/2018 1355   MONOABS 0.1 07/08/2018 1355   EOSABS 0.0 07/08/2018 1355   BASOSABS 0.0 07/08/2018 1355   CMP Latest  Ref Rng & Units 07/08/2018 06/21/2018 03/09/2016  Glucose 70 - 99 mg/dL 111(H) 122(H) 94  BUN 8 - 23 mg/dL 39(H) 41(H) 17  Creatinine 0.61 - 1.24 mg/dL 1.59(H) 1.62(H) 1.20  Sodium 135 - 145 mmol/L 138 142 140  Potassium 3.5 - 5.1 mmol/L 4.9 4.7 4.6  Chloride 98 - 111 mmol/L 107 106 103  CO2 22 - 32 mmol/L 25 27 -  Calcium 8.9 - 10.3 mg/dL 9.4 9.5 -  Total Protein 6.5 - 8.1 g/dL 6.7 6.7 -  Total Bilirubin 0.3 - 1.2 mg/dL 0.7 0.7 -  Alkaline Phos 38 - 126 U/L 45 50 -  AST 15 - 41 U/L 25 31 -  ALT 0 - 44 U/L 28 35 -       DIAGNOSTIC IMAGING:  I have independently reviewed the scans and discussed with the patient.   I have reviewed Venita Lick LPN's note and agree with the documentation.  I personally performed a face-to-face visit, made revisions and my assessment and plan is as follows.    ASSESSMENT & PLAN:   Other pancytopenia ([place]) 1.  Pancytopenia: - He was being evaluated for thrombocytopenia prior to elective knee replacement. - Denies any bleeding per rectum or melena.  Denies any fevers, night sweats or weight loss. -We reviewed his blood work.  I33, folic acid were normal.  ANA was weakly positive.  SPEP was negative.  Peripheral blood flow cytometry did not show any abnormal B cells.  Hepatitis panel was negative.  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 -He does have macrocytic anemia and  MCV of 103. -Platelet count was 53 and neutrophils was 2.5 with ANC of 1100.  LDH was normal. - We have discussed various differential diagnosis.  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  However we will have to rule out other bone marrow infiltrative process. -I have recommended bone marrow aspiration and biopsy.  2.  CKD: - He has CKD of several years duration.  His baseline creatinine is around 1.5. - This is also contributing to his mild to moderate anemia.  Total time spent is 25 minutes with more than 50% of the time spent  discussing lab results, differential diagnosis, further plan of action and coordination of care.    Orders placed this encounter:  Orders Placed This Encounter  Procedures  . CT BONE MARROW ASPIRATION  . CT Biopsy  . CBC with Differential      [name], MD [place] [phone]

## 2018-07-25 NOTE — Assessment & Plan Note (Signed)
1.  Pancytopenia: - He was being evaluated for thrombocytopenia prior to elective knee replacement. - Denies any bleeding per rectum or melena.  Denies any fevers, night sweats or weight loss. -We reviewed his blood work.  S60, folic acid were normal.  ANA was weakly positive.  SPEP was negative.  Peripheral blood flow cytometry did not show any abnormal B cells.  Hepatitis panel was negative.  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 -He does have macrocytic anemia and MCV of 103. -Platelet count was 53 and neutrophils was 2.5 with ANC of 1100.  LDH was normal. - We have discussed various differential diagnosis.  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  However we will have to rule out other bone marrow infiltrative process. -I have recommended bone marrow aspiration and biopsy.  2.  CKD: - He has CKD of several years duration.  His baseline creatinine is around 1.5. - This is also contributing to his mild to moderate anemia.

## 2018-07-25 NOTE — Patient Instructions (Addendum)
[place] at [facility] Discharge Instructions  You were seen today by Dr. Delton Coombes. He went over your recent lab results. He wold like you to have a bone marrow biopsy to further evaluate your blood cells. He explained this procedure and where you can have the procedure done. Stop taking your lisinopril to see if that helps with your dizziness. Take half a pill if your blood pressure gets over 130.  He will see you back in after your biopsy for labs and follow up.   Thank you for choosing [place] at [facility] to provide your oncology and hematology care.  To afford each patient quality time with our provider, please arrive at least 15 minutes before your scheduled appointment time.   If you have a lab appointment with the [facility] please come in thru the  Main Entrance and check in at the main information desk  You need to re-schedule your appointment should you arrive 10 or more minutes late.  We strive to give you quality time with our providers, and arriving late affects you and other patients whose appointments are after yours.  Also, if you no show three or more times for appointments you may be dismissed from the clinic at the providers discretion.     Again, thank you for choosing [facility].  Our hope is that these requests will decrease the amount of time that you wait before being seen by our physicians.       _____________________________________________________________  Should you have questions after your visit to [facility], please contact our office at [phone] between the hours of 8:00 a.m. and 4:30 p.m.  Voicemails left after 4:00 p.m. will not be returned until the following business day.  For prescription refill requests, have your pharmacy contact our office and allow 72 hours.    Cancer Center Support Programs:   > Cancer Support Group  2nd Tuesday of the month 1pm-2pm, Journey Room

## 2018-07-29 ENCOUNTER — Other Ambulatory Visit: Payer: Self-pay | Admitting: Physician Assistant

## 2018-07-30 ENCOUNTER — Ambulatory Visit (HOSPITAL_COMMUNITY)
Admission: RE | Admit: 2018-07-30 | Discharge: 2018-07-30 | Disposition: A | Discharge status: Home / Self Care | Payer: Medicare Other | Source: Ambulatory Visit | Attending: Hematology | Admitting: Hematology

## 2018-07-30 ENCOUNTER — Other Ambulatory Visit: Payer: Self-pay

## 2018-07-30 ENCOUNTER — Encounter (HOSPITAL_COMMUNITY): Payer: Self-pay

## 2018-07-30 DIAGNOSIS — N183 Chronic kidney disease, stage 3 (moderate): Secondary | ICD-10-CM | POA: Diagnosis not present

## 2018-07-30 DIAGNOSIS — Z85828 Personal history of other malignant neoplasm of skin: Secondary | ICD-10-CM | POA: Insufficient documentation

## 2018-07-30 DIAGNOSIS — Z87891 Personal history of nicotine dependence: Secondary | ICD-10-CM | POA: Diagnosis not present

## 2018-07-30 DIAGNOSIS — D696 Thrombocytopenia, unspecified: Secondary | ICD-10-CM | POA: Insufficient documentation

## 2018-07-30 DIAGNOSIS — Z79899 Other long term (current) drug therapy: Secondary | ICD-10-CM | POA: Insufficient documentation

## 2018-07-30 DIAGNOSIS — Z808 Family history of malignant neoplasm of other organs or systems: Secondary | ICD-10-CM | POA: Diagnosis not present

## 2018-07-30 DIAGNOSIS — Z8042 Family history of malignant neoplasm of prostate: Secondary | ICD-10-CM | POA: Diagnosis not present

## 2018-07-30 LAB — CBC WITH DIFFERENTIAL/PLATELET
Abs Immature Granulocytes: 0 10*3/uL (ref 0.00–0.07)
Basophils Absolute: 0 10*3/uL (ref 0.0–0.1)
Basophils Relative: 0 %
Eosinophils Absolute: 0 10*3/uL (ref 0.0–0.5)
Eosinophils Relative: 1 %
HCT: 30.8 % — ABNORMAL LOW (ref 39.0–52.0)
Hemoglobin: 10 g/dL — ABNORMAL LOW (ref 13.0–17.0)
Immature Granulocytes: 0 %
Lymphocytes Relative: 60 %
Lymphs Abs: 1 10*3/uL (ref 0.7–4.0)
MCH: 34.8 pg — ABNORMAL HIGH (ref 26.0–34.0)
MCHC: 32.5 g/dL (ref 30.0–36.0)
MCV: 107.3 fL — ABNORMAL HIGH (ref 80.0–100.0)
Monocytes Absolute: 0.1 10*3/uL (ref 0.1–1.0)
Monocytes Relative: 4 %
Neutro Abs: 0.6 10*3/uL — ABNORMAL LOW (ref 1.7–7.7)
Neutrophils Relative %: 35 %
Platelets: 45 10*3/uL — ABNORMAL LOW (ref 150–400)
RBC: 2.87 MIL/uL — ABNORMAL LOW (ref 4.22–5.81)
RDW: 19 % — ABNORMAL HIGH (ref 11.5–15.5)
WBC: 1.6 10*3/uL — ABNORMAL LOW (ref 4.0–10.5)
nRBC: 0 % (ref 0.0–0.2)

## 2018-07-30 LAB — PROTIME-INR
INR: 1 (ref 0.8–1.2)
Prothrombin Time: 12.6 seconds (ref 11.4–15.2)

## 2018-07-30 MED ORDER — MIDAZOLAM HCL 2 MG/2ML IJ SOLN
INTRAMUSCULAR | Status: AC
  Filled 2018-07-30: qty 4

## 2018-07-30 MED ORDER — SODIUM CHLORIDE 0.9 % IV SOLN
INTRAVENOUS | Status: DC
  Administered 2018-07-30: 10:00:00 via INTRAVENOUS

## 2018-07-30 MED ORDER — FENTANYL CITRATE (PF) 100 MCG/2ML IJ SOLN
INTRAMUSCULAR | Status: AC | PRN
  Administered 2018-07-30 (×2): 50 ug via INTRAVENOUS

## 2018-07-30 MED ORDER — FLUMAZENIL 0.5 MG/5ML IV SOLN
INTRAVENOUS | Status: AC
  Filled 2018-07-30: qty 5

## 2018-07-30 MED ORDER — HYDROCODONE-ACETAMINOPHEN 5-325 MG PO TABS: 1.0000 | ORAL_TABLET | ORAL | Status: DC | PRN

## 2018-07-30 MED ORDER — MIDAZOLAM HCL 2 MG/2ML IJ SOLN
INTRAMUSCULAR | Status: AC | PRN
  Administered 2018-07-30 (×2): 1 mg via INTRAVENOUS

## 2018-07-30 MED ORDER — NALOXONE HCL 0.4 MG/ML IJ SOLN
INTRAMUSCULAR | Status: AC
  Filled 2018-07-30: qty 1

## 2018-07-30 MED ORDER — LIDOCAINE HCL (PF) 1 % IJ SOLN
INTRAMUSCULAR | Status: AC | PRN
  Administered 2018-07-30: 10 mL

## 2018-07-30 MED ORDER — FENTANYL CITRATE (PF) 100 MCG/2ML IJ SOLN
INTRAMUSCULAR | Status: AC
  Filled 2018-07-30: qty 2

## 2018-07-30 NOTE — Consult Note (Signed)
Chief Complaint: Patient was seen in consultation today for CT guided bone marrow biopsy  Referring Physician(s): Katragadda,Sreedhar  Supervising Physician: Markus Daft  Patient Status: [facility] - Out-pt  History of Present Illness: Richard Wallace is a 73 y.o. male with history of chronic kidney disease, macrocytic anemia, neutropenia and thrombocytopenia who presents today for CT-guided bone marrow biopsy for further evaluation.   Past Medical History:  Diagnosis Date  . Arthritis   . 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 Cancer ([place]) 01-28-2014   skin cancer  . CKD (chronic kidney disease), stage III ([place])   . GERD (gastroesophageal reflux disease)   . Heart murmur    NOTED DURING PHYSICAL WHEN HE WAS ENLISTING IN [place] , DIDNT KNOW UNTIL THAT TIME AND REPORTS , "THATS THE LAST I HEARD ABOUT IT "   . History of hypertension    no longer issue  . History of malignant melanoma of skin    excision top of scalp 2015-- no recurrence  . History of urinary retention    post op lumbar fusion surgery 04/ 2016  . 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 Left ureteral calculus   . Seasonal allergies   . Wears glasses   . Wears glasses   . Wears partial dentures    upper and lower    Past Surgical History:  Procedure Laterality Date  . ANKLE FUSION Right 2007  . CARPAL TUNNEL RELEASE Left 12/28/2009   w/ pulley release left long finger  . CARPAL TUNNEL RELEASE Right 07/22/2013   Procedure: RIGHT CARPAL TUNNEL RELEASE;  Surgeon: Cammie Sickle., MD;  Location: [facility];  Service: Orthopedics;  Laterality: Right;  . COLONOSCOPY    . CYSTO/  LEFT RETROGRADE PYELOGRAM  11/21/2010  . CYSTOSCOPY WITH STENT PLACEMENT Left 03/09/2016   Procedure: CYSTOSCOPY WITH STENT PLACEMENT;  Surgeon: Franchot Gallo, MD;  Location: [facility];  Service: Urology;  Laterality:  Left;  . CYSTOSCOPY/RETROGRADE/URETEROSCOPY/STONE EXTRACTION WITH BASKET Left 03/09/2016   Procedure: CYSTOSCOPY/RETROGRADE/URETEROSCOPY/STONE EXTRACTION WITH BASKET;  Surgeon: Franchot Gallo, MD;  Location: [facility];  Service: Urology;  Laterality: Left;  . LEFT URETEROSCOPIC LASER LITHOTRIPSY STONE EXTRACTION/ STENT PLACEMENT  05/23/2010  . MOHS SURGERY     TOP OF THE HEAD   . ORIF ANKLE FRACTURE Right 1978  . POSTERIOR LUMBAR FUSION  08/21/2014   laminectomy and decompression L2 -- L5  . RIGHT LOWER LEG SURGERY  X3  1975 to 1976   including ORIF  . TONSILLECTOMY AND ADENOIDECTOMY  1986  . UMBILICAL HERNIA REPAIR  2009 approx    Allergies: Patient has no known allergies.  Medications: Prior to Admission medications   Medication Sig Start Date End Date Taking? Authorizing Provider  acetaminophen (TYLENOL) 500 MG tablet Take 1,000 mg by mouth every 6 (six) hours as needed for mild pain or moderate pain.    Yes [provider]  allopurinol (ZYLOPRIM) 300 MG tablet Take 300 mg by mouth every evening.   Yes [provider]  celecoxib (CELEBREX) 200 MG capsule Take 200 mg by mouth daily.   Yes [provider]  cetirizine (ZYRTEC) 10 MG tablet Take 10 mg by mouth daily.   Yes [provider]  docusate sodium (COLACE) 100 MG capsule Take 100 mg by mouth daily.    Yes [provider]  fluticasone (FLONASE) 50  MCG/ACT nasal spray Place 1 spray into both nostrils daily.   Yes [provider]  lansoprazole (PREVACID) 15 MG capsule Take 15 mg by mouth every morning.    Yes [provider]  lisinopril (PRINIVIL,ZESTRIL) 5 MG tablet Take 5 mg by mouth daily.   Yes [provider]  magnesium oxide (MAG-OX) 400 MG tablet Take 400 mg by mouth at bedtime.    Yes [provider]  Polyethyl Glycol-Propyl Glycol (SYSTANE OP) Place 1 drop into both eyes 2 (two) times daily.   Yes [provider]   tamsulosin (FLOMAX) 0.4 MG CAPS capsule Take 0.4 mg by mouth daily after supper.   Yes [provider]  traMADol (ULTRAM) 50 MG tablet Take 50 mg by mouth 3 (three) times daily.   Yes [provider]  diclofenac sodium (VOLTAREN) 1 % GEL  07/02/18   [provider]     Family History  Problem Relation Age of Onset  . Stroke Mother   . Prostate cancer Father   . Bone cancer Father   . Rheum arthritis Sister   . Urinary tract infection Sister     Social History   Socioeconomic History  . Marital status: Married    Spouse name: Not on file  . Number of children: Not on file  . Years of education: Not on file  . Highest education level: Not on file  Occupational History  . Not on file  Social Needs  . Financial resource strain: Not on file  . Food insecurity:    Worry: Not on file    Inability: Not on file  . Transportation needs:    Medical: Not on file    Non-medical: Not on file  Tobacco Use  . Smoking status: Former Smoker    Years: 20.00    Types: Cigarettes    Last attempt to quit: 07/17/1986    Years since quitting: 32.0  . Smokeless tobacco: Never Used  Substance and Sexual Activity  . Alcohol use: Yes    Alcohol/week: 7.0 - 14.0 standard drinks    Types: 7 - 14 Cans of beer per week    Comment: 1 -2 beer daily  . Drug use: No  . Sexual activity: Not on file  Lifestyle  . Physical activity:    Days per week: Not on file    Minutes per session: Not on file  . Stress: Not on file  Relationships  . Social connections:    Talks on phone: Not on file    Gets together: Not on file    Attends religious service: Not on file    Active member of club or organization: Not on file    Attends meetings of clubs or organizations: Not on file    Relationship status: Not on file  Other Topics Concern  . Not on file  Social History Narrative  . Not on file       Review of Systems denies fever, headache, chest pain, dyspnea, cough,  abdominal/back pain, nausea, vomiting or bleeding  Vital Signs: BP (!) 141/87   Pulse 85   Temp 98 F (36.7 C) (Oral)   Resp 18   SpO2 100%   Physical Exam awake, alert.  Chest clear to auscultation bilaterally.  Heart with regular rate rhythm.  Abdomen obese, soft, positive bowel sounds, nontender.  Trace to 1+ pretibial edema bilaterally.  Imaging: No results found.  Labs:  CBC: Recent Labs    06/21/18 1411 07/08/18 1355  WBC 3.1* 2.5*  HGB 13.2 11.7*  HCT 40.8 36.4*  PLT 68* 53*    COAGS: Recent Labs    06/21/18 1411 07/08/18 1355  INR 0.94 1.0  APTT 29 30    BMP: Recent Labs    06/21/18 1411 07/08/18 1355  NA 142 138  K 4.7 4.9  CL 106 107  CO2 27 25  GLUCOSE 122* 111*  BUN 41* 39*  CALCIUM 9.5 9.4  CREATININE 1.62* 1.59*  GFRNONAA 41* 42*  GFRAA 48* 49*    LIVER FUNCTION TESTS: Recent Labs    06/21/18 1411 07/08/18 1355  BILITOT 0.7 0.7  AST 31 25  ALT 35 28  ALKPHOS 50 45  PROT 6.7 6.7  ALBUMIN 4.4 4.2    TUMOR MARKERS: No results for input(s): AFPTM, CEA, CA199, CHROMGRNA in the last 8760 hours.  Assessment and Plan:  73 y.o. male with history of chronic kidney disease, macrocytic anemia, neutropenia and thrombocytopenia who presents today for CT-guided bone marrow biopsy for further evaluation.Risks and benefits of procedure was discussed with the patient and/or patient's family including, but not limited to bleeding, infection, damage to adjacent structures or low yield requiring additional tests.  All of the questions were answered and there is agreement to proceed.  Consent signed and in chart.     Thank you for this interesting consult.  I greatly enjoyed meeting Richard Wallace and look forward to participating in their care.  A copy of this report was sent to the requesting provider on this date.  Electronically Signed: D. Rowe Robert, PA-C 07/30/2018, 10:03 AM   I spent a total of 20 minutes   in face to face in  clinical consultation, greater than 50% of which was counseling/coordinating care for CT-guided bone marrow biopsy

## 2018-07-30 NOTE — Procedures (Addendum)
Interventional Radiology Procedure:   Indications: Thrombocytopenia.  Procedure: CT guided bone marrow biopsy  Findings: 2 aspirates and 2 cores from right ilium  Complications: None     EBL: Minimal, less than 10 ml  Plan: Discharge to home in one hour.   Richard Wallace R. Anselm Pancoast, MD  Pager: [phone]

## 2018-07-30 NOTE — Discharge Instructions (Signed)
Bone Marrow Aspiration and Bone Marrow Biopsy, Adult, Care After This sheet gives you information about how to care for yourself after your procedure. Your health care provider may also give you more specific instructions. If you have problems or questions, contact your health care provider. What can I expect after the procedure? After the procedure, it is common to have:  Mild pain and tenderness.  Swelling.  Bruising. Follow these instructions at home: Puncture site care      Follow instructions from your health care provider about how to take care of the puncture site. Make sure you: ? Wash your hands with soap and water before you change your bandage (dressing). If soap and water are not available, use hand sanitizer. ? Change your dressing as told by your health care provider.  Check your puncture siteevery day for signs of infection. Check for: ? More redness, swelling, or pain. ? More fluid or blood. ? Warmth. ? Pus or a bad smell. General instructions  Take over-the-counter and prescription medicines only as told by your health care provider.  Do not take baths, swim, or use a hot tub until your health care provider approves. Ask if you can take a shower or have a sponge bath.  Return to your normal activities as told by your health care provider. Ask your health care provider what activities are safe for you.  Do not drive for 24 hours if you were given a medicine to help you relax (sedative) during your procedure.  Keep all follow-up visits as told by your health care provider. This is important. Contact a health care provider if:  Your pain is not controlled with medicine. Get help right away if:  You have a fever.  You have more redness, swelling, or pain around the puncture site.  You have more fluid or blood coming from the puncture site.  Your puncture site feels warm to the touch.  You have pus or a bad smell coming from the puncture site. These  symptoms may represent a serious problem that is an emergency. Do not wait to see if the symptoms will go away. Get medical help right away. Call your local emergency services (911 in the [place] Do not drive yourself to the hospital. Summary  After the procedure, it is common to have mild pain, tenderness, swelling, and bruising.  Follow instructions from your health care provider about how to take care of the puncture site.  Get help right away if you have any symptoms of infection or if you have more blood or fluid coming from the puncture site. This information is not intended to replace advice given to you by your health care provider. Make sure you discuss any questions you have with your health care provider. Document Released: 11/11/2004 Document Revised: 08/07/2017 Document Reviewed: 10/06/2015 Elsevier Interactive Patient Education  2019 [employer].   Moderate Conscious Sedation, Adult, Care After These instructions provide you with information about caring for yourself after your procedure. Your health care provider may also give you more specific instructions. Your treatment has been planned according to current medical practices, but problems sometimes occur. Call your health care provider if you have any problems or questions after your procedure. What can I expect after the procedure? After your procedure, it is common:  To feel sleepy for several hours.  To feel clumsy and have poor balance for several hours.  To have poor judgment for several hours.  To vomit if you eat too soon. Follow  Follow these instructions at home: For at least 24 hours after the procedure:   Do not: ? Participate in activities where you could fall or become injured. ? Drive. ? Use heavy machinery. ? Drink alcohol. ? Take sleeping pills or medicines that cause drowsiness. ? Make important decisions or sign legal documents. ? Take care of children on your own.  Rest. Eating and  drinking  Follow the diet recommended by your health care provider.  If you vomit: ? Drink water, juice, or soup when you can drink without vomiting. ? Make sure you have little or no nausea before eating solid foods. General instructions  Have a responsible adult stay with you until you are awake and alert.  Take over-the-counter and prescription medicines only as told by your health care provider.  If you smoke, do not smoke without supervision.  Keep all follow-up visits as told by your health care provider. This is important. Contact a health care provider if:  You keep feeling nauseous or you keep vomiting.  You feel light-headed.  You develop a rash.  You have a fever. Get help right away if:  You have trouble breathing. This information is not intended to replace advice given to you by your health care provider. Make sure you discuss any questions you have with your health care provider. Document Released: 02/12/2013 Document Revised: 09/27/2015 Document Reviewed: 08/14/2015 Elsevier Interactive Patient Education  2019 [employer].

## 2018-08-06 ENCOUNTER — Encounter (HOSPITAL_COMMUNITY): Payer: Self-pay | Admitting: Hematology

## 2018-08-14 ENCOUNTER — Inpatient Hospital Stay (HOSPITAL_BASED_OUTPATIENT_CLINIC_OR_DEPARTMENT_OTHER): Payer: Medicare Other | Admitting: Hematology

## 2018-08-14 ENCOUNTER — Encounter (HOSPITAL_COMMUNITY): Payer: Self-pay | Admitting: Hematology

## 2018-08-14 ENCOUNTER — Other Ambulatory Visit: Payer: Self-pay

## 2018-08-14 ENCOUNTER — Inpatient Hospital Stay (HOSPITAL_COMMUNITY): Payer: Medicare Other | Attending: Hematology

## 2018-08-14 VITALS — BP 130/88 | HR 96 | Temp 98.4°F | Resp 20 | Wt 226.0 lb

## 2018-08-14 DIAGNOSIS — N183 Chronic kidney disease, stage 3 (moderate): Secondary | ICD-10-CM | POA: Diagnosis not present

## 2018-08-14 DIAGNOSIS — I959 Hypotension, unspecified: Secondary | ICD-10-CM | POA: Diagnosis not present

## 2018-08-14 DIAGNOSIS — Z791 Long term (current) use of non-steroidal anti-inflammatories (NSAID): Secondary | ICD-10-CM | POA: Insufficient documentation

## 2018-08-14 DIAGNOSIS — Z79899 Other long term (current) drug therapy: Secondary | ICD-10-CM | POA: Diagnosis not present

## 2018-08-14 DIAGNOSIS — Z0001 Encounter for general adult medical examination with abnormal findings: Secondary | ICD-10-CM | POA: Insufficient documentation

## 2018-08-14 DIAGNOSIS — D696 Thrombocytopenia, unspecified: Secondary | ICD-10-CM

## 2018-08-14 DIAGNOSIS — Z85820 Personal history of malignant melanoma of skin: Secondary | ICD-10-CM | POA: Insufficient documentation

## 2018-08-14 DIAGNOSIS — D61818 Other pancytopenia: Secondary | ICD-10-CM | POA: Insufficient documentation

## 2018-08-14 DIAGNOSIS — D46Z Other myelodysplastic syndromes: Secondary | ICD-10-CM | POA: Insufficient documentation

## 2018-08-14 DIAGNOSIS — Z7189 Other specified counseling: Secondary | ICD-10-CM

## 2018-08-14 DIAGNOSIS — Z87891 Personal history of nicotine dependence: Secondary | ICD-10-CM | POA: Insufficient documentation

## 2018-08-14 LAB — CBC WITH DIFFERENTIAL/PLATELET
Abs Immature Granulocytes: 0.01 10*3/uL (ref 0.00–0.07)
Basophils Absolute: 0 10*3/uL (ref 0.0–0.1)
Basophils Relative: 0 %
Eosinophils Absolute: 0 10*3/uL (ref 0.0–0.5)
Eosinophils Relative: 0 %
HCT: 26.6 % — ABNORMAL LOW (ref 39.0–52.0)
Hemoglobin: 8.7 g/dL — ABNORMAL LOW (ref 13.0–17.0)
Immature Granulocytes: 1 %
Lymphocytes Relative: 65 %
Lymphs Abs: 1.2 10*3/uL (ref 0.7–4.0)
MCH: 35.1 pg — ABNORMAL HIGH (ref 26.0–34.0)
MCHC: 32.7 g/dL (ref 30.0–36.0)
MCV: 107.3 fL — ABNORMAL HIGH (ref 80.0–100.0)
Monocytes Absolute: 0.1 10*3/uL (ref 0.1–1.0)
Monocytes Relative: 3 %
Neutro Abs: 0.6 10*3/uL — ABNORMAL LOW (ref 1.7–7.7)
Neutrophils Relative %: 31 %
Platelets: 46 10*3/uL — ABNORMAL LOW (ref 150–400)
RBC: 2.48 MIL/uL — ABNORMAL LOW (ref 4.22–5.81)
RDW: 19.2 % — ABNORMAL HIGH (ref 11.5–15.5)
WBC: 1.8 10*3/uL — ABNORMAL LOW (ref 4.0–10.5)
nRBC: 1.1 % — ABNORMAL HIGH (ref 0.0–0.2)

## 2018-08-14 LAB — COMPREHENSIVE METABOLIC PANEL
ALT: 26 U/L (ref 0–44)
AST: 28 U/L (ref 15–41)
Albumin: 4 g/dL (ref 3.5–5.0)
Alkaline Phosphatase: 54 U/L (ref 38–126)
Anion gap: 8 (ref 5–15)
BUN: 30 mg/dL — ABNORMAL HIGH (ref 8–23)
CO2: 25 mmol/L (ref 22–32)
Calcium: 9.2 mg/dL (ref 8.9–10.3)
Chloride: 106 mmol/L (ref 98–111)
Creatinine, Ser: 1.34 mg/dL — ABNORMAL HIGH (ref 0.61–1.24)
GFR calc Af Amer: >60 mL/min (ref >60)
GFR calc non Af Amer: 52 mL/min — ABNORMAL LOW (ref >60)
Glucose, Bld: 111 mg/dL — ABNORMAL HIGH (ref 70–99)
Potassium: 5 mmol/L (ref 3.5–5.1)
Sodium: 139 mmol/L (ref 135–145)
Total Bilirubin: 1.1 mg/dL (ref 0.3–1.2)
Total Protein: 6.8 g/dL (ref 6.5–8.1)

## 2018-08-14 NOTE — Progress Notes (Signed)
START ON PATHWAY REGIMEN - MDS     A cycle is every 28 days:     Azacitidine   **Always confirm dose/schedule in your pharmacy ordering system**  Patient Characteristics: Higher-Risk (IPSS-R Score > 3.5), First Line, Not a Transplant Candidate WHO Disease Classification: MDS-EB2 Bone Marrow Blasts (percent): > 10% Cytogenetic Category: Good Platelets (x 10^9/L): < 50 Absolute Neutrophil Count (x 10^9/L): < 0.8 Line of Therapy: First Line IPSS-R Risk Category: High IPSS-R Risk Score: 5.5 Check here if patient's risk score was calculated prior to the International Prognostic Scoring System-Revised (IPSS-R): false Hemoglobin (g/dl): ? 10 Patient Characteristics: Not a Transplant Candidate Intent of Therapy: Non-Curative / Palliative Intent, Discussed with Patient

## 2018-08-14 NOTE — Progress Notes (Signed)
DISCONTINUE ON PATHWAY REGIMEN - MDS     A cycle is every 28 days:     Azacitidine   **Always confirm dose/schedule in your pharmacy ordering system**  REASON: Other Reason PRIOR TREATMENT: [id number]: Azacitidine 75 mg/m2 Subcutaneously Days 1-7 q28 Days  START ON PATHWAY REGIMEN - MDS     A cycle is every 28 days:     Azacitidine   **Always confirm dose/schedule in your pharmacy ordering system**  Patient Characteristics: Higher-Risk (IPSS-R Score > 3.5), First Line, Not a Transplant Candidate WHO Disease Classification: MDS-EB2 Bone Marrow Blasts (percent): > 10% Cytogenetic Category: Good Platelets (x 10^9/L): < 50 Absolute Neutrophil Count (x 10^9/L): < 0.8 Line of Therapy: First Line IPSS-R Risk Category: High IPSS-R Risk Score: 5.5 Check here if patient's risk score was calculated prior to the International Prognostic Scoring System-Revised (IPSS-R): false Hemoglobin (g/dl): ? 10 Patient Characteristics: Not a Transplant Candidate Intent of Therapy: Non-Curative / Palliative Intent, Discussed with Patient

## 2018-08-14 NOTE — Progress Notes (Signed)
[name] [address] [place] [address]   CLINIC:  Medical Oncology/Hematology  PCP:  [name], MD [address] [place] [address] [phone]   REASON FOR VISIT:  Follow-up for pancytopenia and bone marrow biopsy results.   CURRENT THERAPY: none   INTERVAL HISTORY:  [name] 73 y.o. male returns for follow-up of bone marrow biopsy. He reports not having any energy.  Has had some dizziness.  He reports that he almost passed out in the shower.  He reports having a nose bleed after using Flonase.  He denies any rectal bleeding.     Overall, he tells me he has been feeling pretty well. Energy levels 50%; appetite 100%.      REVIEW OF SYSTEMS:  Review of Systems  Constitutional: Positive for fatigue.  Neurological: Positive for dizziness.  All other systems reviewed and are negative.    PAST MEDICAL/SURGICAL HISTORY:  Past Medical History:  Diagnosis Date  . Arthritis   . 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 Cancer ([facility]) 01-28-2014   skin cancer  . CKD (chronic kidney disease), stage III ([facility])   . GERD (gastroesophageal reflux disease)   . Heart murmur    NOTED DURING PHYSICAL WHEN HE WAS ENLISTING IN [place] , DIDNT KNOW UNTIL THAT TIME AND REPORTS , "THATS THE LAST I HEARD ABOUT IT "   . History of hypertension    no longer issue  . History of malignant melanoma of skin    excision top of scalp 2015-- no recurrence  . History of urinary retention    post op lumbar fusion surgery 04/ 2016  . 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 Left ureteral calculus   . Seasonal allergies   . Wears glasses   . Wears glasses   . Wears partial dentures    upper and lower   Past Surgical History:  Procedure Laterality Date  . ANKLE FUSION Right 2007  . CARPAL TUNNEL RELEASE Left 12/28/2009   w/ pulley release left long finger  . CARPAL TUNNEL  RELEASE Right 07/22/2013   Procedure: RIGHT CARPAL TUNNEL RELEASE;  Surgeon: Cammie Sickle., MD;  Location: [facility];  Service: Orthopedics;  Laterality: Right;  . COLONOSCOPY    . CYSTO/  LEFT RETROGRADE PYELOGRAM  11/21/2010  . CYSTOSCOPY WITH STENT PLACEMENT Left 03/09/2016   Procedure: CYSTOSCOPY WITH STENT PLACEMENT;  Surgeon: Franchot Gallo, MD;  Location: [facility];  Service: Urology;  Laterality: Left;  . CYSTOSCOPY/RETROGRADE/URETEROSCOPY/STONE EXTRACTION WITH BASKET Left 03/09/2016   Procedure: CYSTOSCOPY/RETROGRADE/URETEROSCOPY/STONE EXTRACTION WITH BASKET;  Surgeon: Franchot Gallo, MD;  Location[facility];  Service: Urology;  Laterality: Left;  . LEFT URETEROSCOPIC LASER LITHOTRIPSY STONE EXTRACTION/ STENT PLACEMENT  05/23/2010  . MOHS SURGERY     TOP OF THE HEAD   . ORIF ANKLE FRACTURE Right 1978  . POSTERIOR LUMBAR FUSION  08/21/2014   laminectomy and decompression L2 -- L5  . RIGHT LOWER LEG SURGERY  X3  1975 to 1976   including ORIF  . TONSILLECTOMY AND ADENOIDECTOMY  1986  . UMBILICAL HERNIA REPAIR  2009 approx     SOCIAL HISTORY:  Social History   Socioeconomic History  . Marital status: Married    Spouse name: Not on file  . Number of children: Not on file  . Years of education: Not  on file  . Highest education level: Not on file  Occupational History  . Not on file  Social Needs  . Financial resource strain: Not on file  . Food insecurity:    Worry: Not on file    Inability: Not on file  . Transportation needs:    Medical: Not on file    Non-medical: Not on file  Tobacco Use  . Smoking status: Former Smoker    Years: 20.00    Types: Cigarettes    Last attempt to quit: 07/17/1986    Years since quitting: 32.0  . Smokeless tobacco: Never Used  Substance and Sexual Activity  . Alcohol use: Yes    Alcohol/week: 7.0 - 14.0 standard drinks    Types: 7 - 14 Cans of beer per week    Comment: 1 -2  beer daily  . Drug use: No  . Sexual activity: Not on file  Lifestyle  . Physical activity:    Days per week: Not on file    Minutes per session: Not on file  . Stress: Not on file  Relationships  . Social connections:    Talks on phone: Not on file    Gets together: Not on file    Attends religious service: Not on file    Active member of club or organization: Not on file    Attends meetings of clubs or organizations: Not on file    Relationship status: Not on file  . Intimate partner violence:    Fear of current or ex partner: Not on file    Emotionally abused: Not on file    Physically abused: Not on file    Forced sexual activity: Not on file  Other Topics Concern  . Not on file  Social History Narrative  . Not on file    FAMILY HISTORY:  Family History  Problem Relation Age of Onset  . Stroke Mother   . Prostate cancer Father   . Bone cancer Father   . Rheum arthritis Sister   . Urinary tract infection Sister     CURRENT MEDICATIONS:  Outpatient Encounter Medications as of 08/14/2018  Medication Sig  . acetaminophen (TYLENOL) 500 MG tablet Take 1,000 mg by mouth every 6 (six) hours as needed for mild pain or moderate pain.   Marland Kitchen allopurinol (ZYLOPRIM) 300 MG tablet Take 300 mg by mouth every evening.  . celecoxib (CELEBREX) 200 MG capsule Take 200 mg by mouth daily. IN THE MORNING  . cetirizine (ZYRTEC) 10 MG tablet Take 10 mg by mouth daily. IN THE MORNING  . diclofenac sodium (VOLTAREN) 1 % GEL Apply 1 g topically 3 (three) times daily as needed (knee pain.).   Marland Kitchen docusate sodium (COLACE) 100 MG capsule Take 100 mg by mouth at bedtime.   . fluticasone (FLONASE) 50 MCG/ACT nasal spray Place 1 spray into both nostrils daily. In the morning  . lansoprazole (PREVACID) 15 MG capsule Take 15 mg by mouth daily before breakfast.   . lisinopril (PRINIVIL,ZESTRIL) 5 MG tablet Take 2.5 mg by mouth daily. In the morning  . magnesium oxide (MAG-OX) 400 MG tablet Take 400 mg by  mouth at bedtime.   Vladimir Faster Glycol-Propyl Glycol (SYSTANE OP) Place 1 drop into both eyes 2 (two) times daily.  . tamsulosin (FLOMAX) 0.4 MG CAPS capsule Take 0.4 mg by mouth every evening.   . traMADol (ULTRAM) 50 MG tablet Take 50 mg by mouth 3 (three) times daily as needed (pain.).  No facility-administered encounter medications on file as of 08/14/2018.     ALLERGIES:  No Known Allergies   PHYSICAL EXAM:  ECOG Performance status: 1  Vitals:   08/14/18 0823  BP: 130/88  Pulse: 96  Resp: 20  Temp: 98.4 F (36.9 C)  SpO2: 96%   Filed Weights   08/14/18 0823  Weight: 226 lb (102.5 kg)    Physical Exam Vitals signs reviewed.  Constitutional:      Appearance: Normal appearance.  Cardiovascular:     Rate and Rhythm: Normal rate and regular rhythm.     Heart sounds: Normal heart sounds.  Pulmonary:     Effort: Pulmonary effort is normal.     Breath sounds: Normal breath sounds.  Abdominal:     General: Bowel sounds are normal. There is no distension.     Palpations: Abdomen is soft. There is no mass.  Musculoskeletal:        General: No swelling.  Skin:    General: Skin is warm.  Neurological:     Mental Status: He is alert and oriented to person, place, and time.  Psychiatric:        Mood and Affect: Mood normal.        Behavior: Behavior normal.      LABORATORY DATA:  I have reviewed the labs as listed.  CBC    Component Value Date/Time   WBC 1.8 (L) 08/14/2018 0801   RBC 2.48 (L) 08/14/2018 0801   HGB 8.7 (L) 08/14/2018 0801   HCT 26.6 (L) 08/14/2018 0801   PLT 46 (L) 08/14/2018 0801   MCV 107.3 (H) 08/14/2018 0801   MCH 35.1 (H) 08/14/2018 0801   MCHC 32.7 08/14/2018 0801   RDW 19.2 (H) 08/14/2018 0801   LYMPHSABS 1.2 08/14/2018 0801   MONOABS 0.1 08/14/2018 0801   EOSABS 0.0 08/14/2018 0801   BASOSABS 0.0 08/14/2018 0801   CMP Latest Ref Rng & Units 08/14/2018 07/08/2018 06/21/2018  Glucose 70 - 99 mg/dL 111(H) 111(H) 122(H)  BUN 8 - 23  mg/dL 30(H) 39(H) 41(H)  Creatinine 0.61 - 1.24 mg/dL 1.34(H) 1.59(H) 1.62(H)  Sodium 135 - 145 mmol/L 139 138 142  Potassium 3.5 - 5.1 mmol/L 5.0 4.9 4.7  Chloride 98 - 111 mmol/L 106 107 106  CO2 22 - 32 mmol/L '25 25 27  ' Calcium 8.9 - 10.3 mg/dL 9.2 9.4 9.5  Total Protein 6.5 - 8.1 g/dL 6.8 6.7 6.7  Total Bilirubin 0.3 - 1.2 mg/dL 1.1 0.7 0.7  Alkaline Phos 38 - 126 U/L 54 45 50  AST 15 - 41 U/L '28 25 31  ' ALT 0 - 44 U/L 26 28 35       DIAGNOSTIC IMAGING:  I have independently reviewed the scans and discussed with the patient.   I have reviewed Francene Finders, NP's note and agree with the documentation.  I personally performed a face-to-face visit, made revisions and my assessment and plan is as follows.    ASSESSMENT & PLAN:   MDS (myelodysplastic syndrome), high grade ([place]) 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  Nutritional work-up was negative. 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  Flow cytometry shows 12% blasts. 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  FISH panel is pending. - IPSS-R score of 5.5 with high risk MDS, median survival of 1.6 years without treatment, 25% AML progression in the absence of treatment of 1.4  years. -I had a prolonged discussion with the patient and his wife about the normal prognosis of high-risk MDS.  His CBC today shows ANC of 600, platelets of 46 and hemoglobin which dropped to 8.7. - I have recommended treatment with hypo-methylating agent azacitidine.  We talked about side effects in detail. -We will initiate therapy next week as his hemoglobin is dropping rather quickly.  He denies any bleeding per rectum or melena. - We will have a port placed for chemotherapy administration.  We will also make a referral to transplant center. -I will see him back on day 1 of treatment.  2.  CKD: -He has CKD of several years  duration.  Baseline creatinine is around 1.3-1.5.    Total time spent is 40 minutes with more than 50% of the time spent face-to-face discussing new diagnosis, prognosis, treatment options, side effects and coordination of care.    Orders placed this encounter:  Orders Placed This Encounter  Procedures  . Comprehensive metabolic panel  . Miscellaneous [facility] test (send-out)  . CBC with Differential/Platelet  . Comprehensive metabolic panel      [name], MD [place] [phone]

## 2018-08-14 NOTE — Patient Instructions (Addendum)
[facility] at [facility] Discharge Instructions  You were seen today by Dr. Delton Coombes. He reviewed your recent test results.  He discussed with you about starting treatment.  He talked about starting Vidaza and you will receive this for 7 days every 28 days.  You will get the treatment on Monday - Friday and then Monday and Tuesday the following week.  The safest way to administer the treatment is with a port a cath. We will be referring you to a general surgeon here in town that will place this port a cath for you.  Dr. Delton Coombes discussed with you that you should see improvement after a few treatments.  You should start to feel like you have more energy and feel better once your blood counts improve.  He discussed with you that you will get this treatment for as long as it helps your counts.  That means that there is not a set number of treatments.  There are a lot of new drugs that can help with this condition.    This is a bone marrow cancer.    Allogenic bone marrow transplant would be your only option for this treatment and there is a 30% chance of death rate so at this time we don't feel like he is a candidate.  We will refer him to a transplant center for second opinion.    Prognosis without treatment would be 1.5 years.  With treatment it could be much longer.    I have provided you with written material today on the medication and provided some material on the diagnosis of MDS - myelodysplastic syndrome  Dr. Delton Coombes discussed with you that your knee replacement would have to wait until your blood counts come up.  After a few treatments we will discuss this further.      Thank you for choosing [facility] at [facility] to provide your oncology and hematology care.  To afford each patient quality time with our provider, please arrive at least 15 minutes before your scheduled appointment time.   If you have a lab appointment with the  [facility] please come in thru the  Main Entrance and check in at the main information desk  You need to re-schedule your appointment should you arrive 10 or more minutes late.  We strive to give you quality time with our providers, and arriving late affects you and other patients whose appointments are after yours.  Also, if you no show three or more times for appointments you may be dismissed from the clinic at the providers discretion.     Again, thank you for choosing [facility].  Our hope is that these requests will decrease the amount of time that you wait before being seen by our physicians.       _____________________________________________________________  Should you have questions after your visit to [facility], please contact our office at [phone] between the hours of 8:00 a.m. and 4:30 p.m.  Voicemails left after 4:00 p.m. will not be returned until the following business day.  For prescription refill requests, have your pharmacy contact our office and allow 72 hours.    Cancer Center Support Programs:   > Cancer Support Group  2nd Tuesday of the month 1pm-2pm, Journey Room

## 2018-08-14 NOTE — Assessment & Plan Note (Signed)
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  Nutritional work-up was negative. 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  Flow cytometry shows 12% blasts. 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  FISH panel is pending. 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 -I had a prolonged discussion with the patient and his wife about the normal prognosis of high-risk MDS.  His CBC today shows ANC of 600, platelets of 46 and hemoglobin which dropped to 8.7. - I have recommended treatment with hypo-methylating agent azacitidine.  We talked about side effects in detail. -We will initiate therapy next week as his hemoglobin is dropping rather quickly.  He denies any bleeding per rectum or melena. - We will have a port placed for chemotherapy administration.  We will also make a referral to transplant center. -I will see him back on day 1 of treatment.  2.  CKD: -He has CKD of several years duration.  Baseline creatinine is around 1.3-1.5.

## 2018-08-15 ENCOUNTER — Other Ambulatory Visit: Payer: Self-pay

## 2018-08-15 ENCOUNTER — Encounter (HOSPITAL_COMMUNITY): Payer: Self-pay

## 2018-08-15 ENCOUNTER — Encounter (HOSPITAL_COMMUNITY)
Admission: RE | Admit: 2018-08-15 | Discharge: 2018-08-15 | Disposition: A | Discharge status: Home / Self Care | Payer: Medicare Other | Source: Ambulatory Visit | Attending: General Surgery | Admitting: General Surgery

## 2018-08-15 DIAGNOSIS — D61818 Other pancytopenia: Secondary | ICD-10-CM | POA: Diagnosis not present

## 2018-08-15 HISTORY — DX: Personal history of urinary calculi: Z87.442

## 2018-08-15 NOTE — Patient Instructions (Signed)
Verbalized understanding of npo status day before surgery. Knows to arrive at 0700. Understands to call if have any questions or concerns .  Understands covid restrictions

## 2018-08-18 NOTE — Anesthesia Preprocedure Evaluation (Addendum)
Anesthesia Evaluation  Patient identified by MRN, date of birth, ID band Patient awake    Reviewed: Allergy & Precautions, NPO status , Patient's Chart, lab work & pertinent test results  Airway Mallampati: II  TM Distance: >3 FB Neck ROM: Full    Dental no notable dental hx. (+) Partial Upper, Partial Lower   Pulmonary neg pulmonary ROS, former smoker,    Pulmonary exam normal breath sounds clear to auscultation       Cardiovascular Exercise Tolerance: Poor hypertension, Pt. on medications negative cardio ROS Normal cardiovascular examII Rhythm:Regular Rate:Normal  Told had murmer prior to [employer] Sx -no issues since    Neuro/Psych negative neurological ROS  negative psych ROS   GI/Hepatic Neg liver ROS, GERD  Medicated and Controlled,Denies GERD today    Endo/Other  negative endocrine ROS  Renal/GU Renal InsufficiencyRenal disease  negative genitourinary   Musculoskeletal  (+) Arthritis , Osteoarthritis,    Abdominal   Peds negative pediatric ROS (+)  Hematology negative hematology ROS (+) Reports Bone marrow CA with Pancytopenia  Last WBC 1.8  Last PLT ~45K stable (>40 K) H/H ~8/25 Reports nose bleeds remotely which stopped without intervention. Denies any other spontaneous bleeding events  Reports he hasn't received any transfusions yet when questioned    Anesthesia Other Findings   Reproductive/Obstetrics negative OB ROS                           Anesthesia Physical Anesthesia Plan  ASA: IV  Anesthesia Plan: MAC   Post-op Pain Management:    Induction: Intravenous  PONV Risk Score and Plan:   Airway Management Planned: Nasal Cannula and Simple Face Mask  Additional Equipment:   Intra-op Plan:   Post-operative Plan:   Informed Consent: I have reviewed the patients History and Physical, chart, labs and discussed the procedure including the risks, benefits and  alternatives for the proposed anesthesia with the patient or authorized representative who has indicated his/her understanding and acceptance.     Dental advisory given  Plan Discussed with: CRNA  Anesthesia Plan Comments: (Full PPE use planned with MAC GETA as needed )        Anesthesia Quick Evaluation

## 2018-08-19 ENCOUNTER — Encounter (HOSPITAL_COMMUNITY): Payer: Self-pay

## 2018-08-19 ENCOUNTER — Ambulatory Visit (HOSPITAL_COMMUNITY)
Admission: RE | Admit: 2018-08-19 | Discharge: 2018-08-19 | Disposition: A | Discharge status: Home / Self Care | Payer: Medicare Other | Attending: General Surgery | Admitting: General Surgery

## 2018-08-19 ENCOUNTER — Ambulatory Visit (HOSPITAL_COMMUNITY): Payer: Medicare Other

## 2018-08-19 ENCOUNTER — Ambulatory Visit (HOSPITAL_COMMUNITY): Payer: Medicare Other | Admitting: Anesthesiology

## 2018-08-19 ENCOUNTER — Other Ambulatory Visit: Payer: Self-pay

## 2018-08-19 ENCOUNTER — Encounter (HOSPITAL_COMMUNITY): Payer: Self-pay | Admitting: Hematology

## 2018-08-19 ENCOUNTER — Encounter (HOSPITAL_COMMUNITY)
Admission: RE | Disposition: A | Discharge status: Home / Self Care | Payer: Self-pay | Source: Home / Self Care | Attending: General Surgery

## 2018-08-19 DIAGNOSIS — Z87442 Personal history of urinary calculi: Secondary | ICD-10-CM | POA: Diagnosis not present

## 2018-08-19 DIAGNOSIS — Z842 Family history of other diseases of the genitourinary system: Secondary | ICD-10-CM | POA: Diagnosis not present

## 2018-08-19 DIAGNOSIS — K219 Gastro-esophageal reflux disease without esophagitis: Secondary | ICD-10-CM | POA: Diagnosis not present

## 2018-08-19 DIAGNOSIS — Z95828 Presence of other vascular implants and grafts: Secondary | ICD-10-CM | POA: Insufficient documentation

## 2018-08-19 DIAGNOSIS — Z79899 Other long term (current) drug therapy: Secondary | ICD-10-CM | POA: Diagnosis not present

## 2018-08-19 DIAGNOSIS — Z87891 Personal history of nicotine dependence: Secondary | ICD-10-CM | POA: Diagnosis not present

## 2018-08-19 DIAGNOSIS — Z791 Long term (current) use of non-steroidal anti-inflammatories (NSAID): Secondary | ICD-10-CM | POA: Diagnosis not present

## 2018-08-19 DIAGNOSIS — Z823 Family history of stroke: Secondary | ICD-10-CM | POA: Diagnosis not present

## 2018-08-19 DIAGNOSIS — Z8042 Family history of malignant neoplasm of prostate: Secondary | ICD-10-CM | POA: Diagnosis not present

## 2018-08-19 DIAGNOSIS — N183 Chronic kidney disease, stage 3 (moderate): Secondary | ICD-10-CM | POA: Insufficient documentation

## 2018-08-19 DIAGNOSIS — M1990 Unspecified osteoarthritis, unspecified site: Secondary | ICD-10-CM | POA: Diagnosis not present

## 2018-08-19 DIAGNOSIS — Z808 Family history of malignant neoplasm of other organs or systems: Secondary | ICD-10-CM | POA: Diagnosis not present

## 2018-08-19 DIAGNOSIS — Z8261 Family history of arthritis: Secondary | ICD-10-CM | POA: Insufficient documentation

## 2018-08-19 DIAGNOSIS — D61818 Other pancytopenia: Secondary | ICD-10-CM | POA: Insufficient documentation

## 2018-08-19 DIAGNOSIS — I129 Hypertensive chronic kidney disease with stage 1 through stage 4 chronic kidney disease, or unspecified chronic kidney disease: Secondary | ICD-10-CM | POA: Insufficient documentation

## 2018-08-19 DIAGNOSIS — Z85820 Personal history of malignant melanoma of skin: Secondary | ICD-10-CM | POA: Diagnosis not present

## 2018-08-19 DIAGNOSIS — R011 Cardiac murmur, unspecified: Secondary | ICD-10-CM | POA: Insufficient documentation

## 2018-08-19 DIAGNOSIS — Z981 Arthrodesis status: Secondary | ICD-10-CM | POA: Diagnosis not present

## 2018-08-19 HISTORY — PX: PORTACATH PLACEMENT: SHX2246

## 2018-08-19 SURGERY — INSERTION, TUNNELED CENTRAL VENOUS DEVICE, WITH PORT
Anesthesia: Monitor Anesthesia Care | Site: Chest | Laterality: Right

## 2018-08-19 MED ORDER — MIDAZOLAM HCL 2 MG/2ML IJ SOLN: 0.5000 mg | Freq: Once | INTRAMUSCULAR | Status: DC | PRN

## 2018-08-19 MED ORDER — KETAMINE HCL 10 MG/ML IJ SOLN
INTRAMUSCULAR | Status: DC | PRN
  Administered 2018-08-19: 10 mg via INTRAVENOUS

## 2018-08-19 MED ORDER — LIDOCAINE HCL (PF) 1 % IJ SOLN
INTRAMUSCULAR | Status: DC | PRN
  Administered 2018-08-19: 10 mL
  Administered 2018-08-19: 8 mL

## 2018-08-19 MED ORDER — KETOROLAC TROMETHAMINE 30 MG/ML IJ SOLN
30.0000 mg | Freq: Once | INTRAMUSCULAR | Status: AC
  Administered 2018-08-19: 30 mg via INTRAVENOUS

## 2018-08-19 MED ORDER — LACTATED RINGERS IV SOLN
INTRAVENOUS | Status: DC
  Administered 2018-08-19: 08:00:00 via INTRAVENOUS

## 2018-08-19 MED ORDER — LIDOCAINE-PRILOCAINE 2.5-2.5 % EX CREA: TOPICAL_CREAM | CUTANEOUS | 3 refills | Status: DC

## 2018-08-19 MED ORDER — GLYCOPYRROLATE 0.2 MG/ML IJ SOLN
INTRAMUSCULAR | Status: DC | PRN
  Administered 2018-08-19: 0.2 mg via INTRAVENOUS

## 2018-08-19 MED ORDER — PROMETHAZINE HCL 25 MG/ML IJ SOLN: 6.2500 mg | INTRAMUSCULAR | Status: DC | PRN

## 2018-08-19 MED ORDER — SODIUM CHLORIDE (PF) 0.9 % IJ SOLN
INTRAMUSCULAR | Status: DC | PRN
  Administered 2018-08-19: 1000 mL

## 2018-08-19 MED ORDER — LIDOCAINE HCL (PF) 1 % IJ SOLN
INTRAMUSCULAR | Status: AC
  Filled 2018-08-19: qty 30

## 2018-08-19 MED ORDER — HEPARIN SOD (PORK) LOCK FLUSH 100 UNIT/ML IV SOLN
INTRAVENOUS | Status: DC | PRN
  Administered 2018-08-19: 500 [IU] via INTRAVENOUS

## 2018-08-19 MED ORDER — KETAMINE HCL 50 MG/5ML IJ SOSY
PREFILLED_SYRINGE | INTRAMUSCULAR | Status: AC
  Filled 2018-08-19: qty 5

## 2018-08-19 MED ORDER — HEPARIN SOD (PORK) LOCK FLUSH 100 UNIT/ML IV SOLN
INTRAVENOUS | Status: AC
  Filled 2018-08-19: qty 5

## 2018-08-19 MED ORDER — CHLORHEXIDINE GLUCONATE CLOTH 2 % EX PADS
6.0000 | MEDICATED_PAD | Freq: Once | CUTANEOUS | Status: AC
  Administered 2018-08-19: 6 via TOPICAL

## 2018-08-19 MED ORDER — PROPOFOL 500 MG/50ML IV EMUL
INTRAVENOUS | Status: DC | PRN
  Administered 2018-08-19: 75 ug/kg/min via INTRAVENOUS

## 2018-08-19 MED ORDER — LIDOCAINE HCL (CARDIAC) PF 100 MG/5ML IV SOSY
PREFILLED_SYRINGE | INTRAVENOUS | Status: DC | PRN
  Administered 2018-08-19: 40 mg via INTRAVENOUS

## 2018-08-19 MED ORDER — KETOROLAC TROMETHAMINE 30 MG/ML IJ SOLN
INTRAMUSCULAR | Status: AC
  Filled 2018-08-19: qty 1

## 2018-08-19 MED ORDER — HYDROCODONE-ACETAMINOPHEN 5-325 MG PO TABS: 1.0000 | ORAL_TABLET | ORAL | 0 refills | Status: DC | PRN

## 2018-08-19 MED ORDER — HYDROMORPHONE HCL 1 MG/ML IJ SOLN: 0.2500 mg | INTRAMUSCULAR | Status: DC | PRN

## 2018-08-19 MED ORDER — PROCHLORPERAZINE MALEATE 10 MG PO TABS: 10.0000 mg | ORAL_TABLET | Freq: Four times a day (QID) | ORAL | 1 refills | Status: DC | PRN

## 2018-08-19 MED ORDER — HYDROCODONE-ACETAMINOPHEN 7.5-325 MG PO TABS: 1.0000 | ORAL_TABLET | Freq: Once | ORAL | Status: DC | PRN

## 2018-08-19 MED ORDER — CHLORHEXIDINE GLUCONATE CLOTH 2 % EX PADS: 6.0000 | MEDICATED_PAD | Freq: Once | CUTANEOUS | Status: DC

## 2018-08-19 MED ORDER — PROPOFOL 10 MG/ML IV BOLUS
INTRAVENOUS | Status: DC | PRN
  Administered 2018-08-19 (×2): 20 mg via INTRAVENOUS

## 2018-08-19 MED ORDER — CEFAZOLIN SODIUM-DEXTROSE 2-4 GM/100ML-% IV SOLN
2.0000 g | INTRAVENOUS | Status: AC
  Administered 2018-08-19: 2 g via INTRAVENOUS
  Filled 2018-08-19: qty 100

## 2018-08-19 SURGICAL SUPPLY — 30 items
BAG DECANTER FOR FLEXI CONT (MISCELLANEOUS) ×2 IMPLANT
CHLORAPREP W/TINT 10.5 ML (MISCELLANEOUS) ×2 IMPLANT
CLOTH BEACON ORANGE TIMEOUT ST (SAFETY) ×2 IMPLANT
COVER LIGHT HANDLE STERIS (MISCELLANEOUS) ×4 IMPLANT
COVER WAND RF STERILE (DRAPES) ×2 IMPLANT
DECANTER SPIKE VIAL GLASS SM (MISCELLANEOUS) ×2 IMPLANT
DERMABOND ADVANCED (GAUZE/BANDAGES/DRESSINGS) ×1
DERMABOND ADVANCED .7 DNX12 (GAUZE/BANDAGES/DRESSINGS) ×1 IMPLANT
DRAPE C-ARM FOLDED MOBILE STRL (DRAPES) ×2 IMPLANT
ELECT REM PT RETURN 9FT ADLT (ELECTROSURGICAL) ×2
ELECTRODE REM PT RTRN 9FT ADLT (ELECTROSURGICAL) ×1 IMPLANT
GLOVE BIOGEL M 7.0 STRL (GLOVE) ×2 IMPLANT
GLOVE BIOGEL PI IND STRL 7.0 (GLOVE) ×2 IMPLANT
GLOVE BIOGEL PI INDICATOR 7.0 (GLOVE) ×2
GLOVE SURG SS PI 7.5 STRL IVOR (GLOVE) ×2 IMPLANT
GOWN STRL REUS W/TWL LRG LVL3 (GOWN DISPOSABLE) ×4 IMPLANT
IV NS 500ML (IV SOLUTION) ×1
IV NS 500ML BAXH (IV SOLUTION) ×1 IMPLANT
KIT PORT POWER 8FR ISP MRI (Port) ×2 IMPLANT
KIT TURNOVER KIT A (KITS) ×2 IMPLANT
NEEDLE HYPO 25X1 1.5 SAFETY (NEEDLE) ×2 IMPLANT
NS IRRIG 1000ML POUR BTL (IV SOLUTION) ×2 IMPLANT
PACK MINOR (CUSTOM PROCEDURE TRAY) ×2 IMPLANT
PAD ARMBOARD 7.5X6 YLW CONV (MISCELLANEOUS) ×2 IMPLANT
SET BASIN LINEN APH (SET/KITS/TRAYS/PACK) ×2 IMPLANT
SUT MNCRL AB 4-0 PS2 18 (SUTURE) ×2 IMPLANT
SUT VIC AB 3-0 SH 27 (SUTURE) ×1
SUT VIC AB 3-0 SH 27X BRD (SUTURE) ×1 IMPLANT
SYR 5ML LL (SYRINGE) ×2 IMPLANT
SYR CONTROL 10ML LL (SYRINGE) ×2 IMPLANT

## 2018-08-19 NOTE — Transfer of Care (Signed)
Immediate Anesthesia Transfer of Care Note  Patient: Richard Wallace  Procedure(s) Performed: INSERTION PORT-A-CATH (attached catheter in right subclavian) (Right Chest)  Patient Location: PACU  Anesthesia Type:MAC  Level of Consciousness: awake, alert , oriented and patient cooperative  Airway & Oxygen Therapy: Patient Spontanous Breathing  Post-op Assessment: Report given to RN and Post -op Vital signs reviewed and stable  Post vital signs: Reviewed and stable  Last Vitals:  Vitals Value Taken Time  BP 128/77 08/19/2018  9:19 AM  Temp    Pulse 83 08/19/2018  9:23 AM  Resp 16 08/19/2018  9:23 AM  SpO2 100 % 08/19/2018  9:23 AM  Vitals shown include unvalidated device data.  Last Pain:  Vitals:   08/19/18 0804  TempSrc:   PainSc: 2       Patients Stated Pain Goal: 4 (37/29/02 1115)  Complications: No apparent anesthesia complications

## 2018-08-19 NOTE — Anesthesia Postprocedure Evaluation (Signed)
Anesthesia Post Note  Patient: Richard Wallace  Procedure(s) Performed: INSERTION PORT-A-CATH (attached catheter in right subclavian) (Right Chest)  Patient location during evaluation: PACU Anesthesia Type: MAC Level of consciousness: oriented Pain management: pain level controlled Vital Signs Assessment: post-procedure vital signs reviewed and stable Respiratory status: spontaneous breathing Cardiovascular status: stable Postop Assessment: no apparent nausea or vomiting Anesthetic complications: no     Last Vitals:  Vitals:   08/19/18 0804 08/19/18 0918  BP: 136/75 128/77  Pulse: 84 84  Resp: 16 17  Temp:  36.7 C  SpO2: 97% 96%    Last Pain:  Vitals:   08/19/18 0918  TempSrc:   PainSc: 0-No pain                 ADAMS, AMY A

## 2018-08-19 NOTE — Patient Instructions (Signed)
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  You will be treated with azacitidine (Vidaza) on days 1-7 (Monday-Friday, then the following Monday and Tuesday) every 28 days.  The intent of treatment is to control your cancer and help alleviate any symptoms you may be having related to this cancer. You will see the doctor regularly throughout treatment.  We monitor your lab work prior to every treatment. The doctor monitors your response to treatment by the way you are feeling, your blood work, and scans periodically. There will be wait times while you are here for treatment.  It will take about 30 minutes to 1 hour for your lab work to result.  Then there will be wait times while pharmacy mixes your medications.   Medications you will receive in the clinic prior to your chemotherapy medications:  Aloxi:  ALOXI is used in adults to help prevent the nausea and vomiting that happens with certain anti-cancer medicines (chemotherapy).  Aloxi is a long acting medication, and will remain in your system for 24-36 hours.   Dexamethasone:  This is a steroid given prior to chemotherapy to help prevent allergic reactions; it may also help prevent and control nausea and diarrhea.    Azacitidine (Vidaza)  About This Drug Azacitidine is used to treat cancer. It is given in the vein (IV).  It will take about 15 minutes to infuse.   Possible Side Effects . Bone marrow suppression. This is a decrease in the number of white blood cells, red blood cells, and platelets. This may raise your risk of infection, make you tired and weak (fatigue), and raise your risk of bleeding . Fever and chills . Nausea and vomiting (throwing up) . Constipation (not able to move bowels) . Diarrhea (loose bowel movements) . Decreased potassium . Weakness . Bruising . Skin and tissue irritation may involve redness, pain, warmth, or swelling at the injection site. Marland Kitchen  Petechiae. Tiny red spots on the skin, often from low platelets.  Note: Each of the side effects above was reported in 30% or greater of patients treated with azacitidine. Not all possible side effects are included above.  Warnings and Precautions . Risk of changes in your liver function if you have underlying liver disease . Changes in your kidney function which can cause kidney failure and be life-threatening . Tumor lysis syndrome: This drug may act on the cancer cells very quickly. This may affect how your kidneys work.  Important Information . This drug may be present in the saliva, tears, sweat, urine, stool, vomit, semen, and vaginal secretions. Talk to your doctor and/or your nurse about the necessary precautions to take during this time.  Treating Side Effects . Manage tiredness by pacing your activities for the day. . Be sure to include periods of rest between energy-draining activities. . To help decrease the risk of infections, wash your hands regularly. . Avoid close contact with people who have a cold, the flu, or other infections. . To help decrease the risk of bleeding, use a soft toothbrush. Check with your nurse before using dental floss. . Be very careful when using knives or tools. . Use an electric shaver instead of a razor. . Drink plenty of fluids (a minimum of eight glasses per day is recommended). . If you throw up or have loose bowel movements, you should drink more fluids so that you do not become dehydrated (lack of water in the body from losing too  much fluid). . To help with nausea and vomiting, eat small, frequent meals instead of three large meals a day. Choose foods and drinks that are at room temperature. Ask your nurse or doctor about other helpful tips and medicine that is available to help stop or lessen these symptoms. . If you have diarrhea, eat low-fiber foods that are high in protein and calories and avoid foods that can irritate your digestive  tracts or lead to cramping. . Ask your nurse or doctor about medicine that can lessen or stop your diarrhea. . Ask your doctor or nurse about medicines that are available to help stop or lessen constipation. . If you are not able to move your bowels, check with your doctor or nurse before you use enemas, laxatives, or suppositories.  Food and Drug Interactions . There are no known interactions of azacitidine with food. . This drug may interact with other medicines. Tell your doctor and pharmacist about all the medicines and dietary supplements (vitamins, minerals, herbs and others) that you are taking at this time. Also, check with your doctor or pharmacist before starting any new prescription or over-thecounter medicines, or dietary supplements to make sure that there are no interactions.  When to Call the Doctor Call your doctor or nurse if you have any of these symptoms and/or any new or unusual symptoms: . Fever of 100.4 F (38 C) or higher . Chills . Tiredness that interferes with your daily activities . Feeling dizzy or lightheaded . Easy bleeding or bruising . Nausea that stops you from eating or drinking and/or is not relieved by prescribed medicines . Throwing up more than 3 times a day . Diarrhea, 4 times in one day or diarrhea with lack of strength or a feeling of being dizzy . No bowel movement in 3 days or when you feel uncomfortable . Pain, redness, or swelling at the site of the injection . Any new tiny red spots on the skin . Decreased or dark urine . Signs of possible liver problems: dark urine, pale bowel movements, bad stomach pain, feeling very tired and weak, unusual itching, or yellowing of the eyes or skin . Signs of tumor lysis: Confusion or agitation, decreased urine, nausea/vomiting, diarrhea, muscle cramping, numbness and/or tingling, seizures. . If you think you may be pregnant or may have impregnated your partner  Reproduction Warnings . Pregnancy  warning: This drug can have harmful effects on the unborn baby. Women of childbearing potential and men with male partners of child bearing potential should use effective methods of birth control during your cancer treatment. Let your doctor know right away if you think you may be pregnant or may have impregnated your partner. . Breastfeeding warning: It is not known if this drug passes into breast milk. Women should not breastfeed during treatment because this drug could enter the breast milk and cause harm to a breastfeeding baby. . Fertility warning: In men and women both, this drug may affect your ability to have children in the future. Talk with your doctor or nurse if you plan to have children. Ask for information on sperm or egg banking.  SELF CARE ACTIVITIES WHILE ON CHEMOTHERAPY:  Hydration Increase your fluid intake 48 hours prior to treatment and drink at least 8 to 12 cups (64 ounces) of water/decaffeinated beverages per day after treatment. You can still have your cup of coffee or soda but these beverages do not count as part of your 8 to 12 cups that you need to drink  daily. No alcohol intake.  Medications Continue taking your normal prescription medication as prescribed.  If you start any new herbal or new supplements please let us know first to make sure it is safe.  Mouth Care Have teeth cleaned professionally before starting treatment. Keep dentures and partial plates clean. Use soft toothbrush and do not use mouthwashes that contain alcohol. Biotene is a good mouthwash that is available at most pharmacies or may be ordered by calling [phone]. Use warm salt water gargles (1 teaspoon salt per 1 quart warm water) before and after meals and at bedtime. Or you may rinse with 2 tablespoons of three-percent hydrogen peroxide mixed in eight ounces of water. If you are still having problems with your mouth or sores in your mouth please call the clinic. If you need dental work,  please let the doctor know before you go for your appointment so that we can coordinate the best possible time for you in regards to your chemo regimen. You need to also let your dentist know that you are actively taking chemo. We may need to do labs prior to your dental appointment.  Skin Care Always use sunscreen that has not expired and with SPF (Sun Protection Factor) of 50 or higher. Wear hats to protect your head from the sun. Remember to use sunscreen on your hands, ears, face, & feet.  Use good moisturizing lotions such as udder cream, eucerin, or even Vaseline. Some chemotherapies can cause dry skin, color changes in your skin and nails.    . Avoid long, hot showers or baths. . Use gentle, fragrance-free soaps and laundry detergent. . Use moisturizers, preferably creams or ointments rather than lotions because the thicker consistency is better at preventing skin dehydration. Apply the cream or ointment within 15 minutes of showering. Reapply moisturizer at night, and moisturize your hands every time after you wash them.  Hair Loss (if your doctor says your hair will fall out)  . If your doctor says that your hair is likely to fall out, decide before you begin chemo whether you want to wear a wig. You may want to shop before treatment to match your hair color. . Hats, turbans, and scarves can also camouflage hair loss, although some people prefer to leave their heads uncovered. If you go bare-headed outdoors, be sure to use sunscreen on your scalp. . Cut your hair short. It eases the inconvenience of shedding lots of hair, but it also can reduce the emotional impact of watching your hair fall out. . Don't perm or color your hair during chemotherapy. Those chemical treatments are already damaging to hair and can enhance hair loss. Once your chemo treatments are done and your hair has grown back, it's OK to resume dyeing or perming hair.  With chemotherapy, hair loss is almost always temporary.  But when it grows back, it may be a different color or texture. In older adults who still had hair color before chemotherapy, the new growth may be completely gray.  Often, new hair is very fine and soft.  Infection Prevention Please wash your hands for at least 30 seconds using warm soapy water. Handwashing is the #1 way to prevent the spread of germs. Stay away from sick people or people who are getting over a cold. If you develop respiratory systems such as green/yellow mucus production or productive cough or persistent cough let us know and we will see if you need an antibiotic. It is a good idea to keep a  pair of gloves on when going into grocery stores/[employer] to decrease your risk of coming into contact with germs on the carts, etc. Carry alcohol hand gel with you at all times and use it frequently if out in public. If your temperature reaches 100.5 or higher please call the clinic and let us know.  If it is after hours or on the weekend please go to the ER if your temperature is over 100.5.  Please have your own personal thermometer at home to use.    Sex and bodily fluids If you are going to have sex, a condom must be used to protect the person that isn't taking chemotherapy. Chemo can decrease your libido (sex drive). For a few days after chemotherapy, chemotherapy can be excreted through your bodily fluids.  When using the toilet please close the lid and flush the toilet twice.  Do this for a few day after you have had chemotherapy.   Effects of chemotherapy on your sex life Some changes are simple and won't last long. They won't affect your sex life permanently.  Sometimes you may feel: . too tired . not strong enough to be very active . sick or sore  . not in the mood . anxious or low Your anxiety might not seem related to sex. For example, you may be worried about the cancer and how your treatment is going. Or you may be worried about money, or about how you family are coping with your  illness. These things can cause stress, which can affect your interest in sex. It's important to talk to your partner about how you feel. Remember - the changes to your sex life don't usually last long. There's usually no medical reason to stop having sex during chemo. The drugs won't have any long term physical effects on your performance or enjoyment of sex. Cancer can't be passed on to your partner during sex  Contraception It's important to use reliable contraception during treatment. Avoid getting pregnant while you or your partner are having chemotherapy. This is because the drugs may harm the baby. Sometimes chemotherapy drugs can leave a man or woman infertile.  This means you would not be able to have children in the future. You might want to talk to someone about permanent infertility. It can be very difficult to learn that you may no longer be able to have children. Some people find counselling helpful. There might be ways to preserve your fertility, although this is easier for men than for women. You may want to speak to a fertility expert. You can talk about sperm banking or harvesting your eggs. You can also ask about other fertility options, such as donor eggs. If you have or have had breast cancer, your doctor might advise you not to take the contraceptive pill. This is because the hormones in it might affect the cancer.  It is not known for sure whether or not chemotherapy drugs can be passed on through semen or secretions from the vagina. Because of this some doctors advise people to use a barrier method if you have sex during treatment. This applies to vaginal, anal or oral sex. Generally, doctors advise a barrier method only for the time you are actually having the treatment and for about a week after your treatment. Advice like this can be worrying, but this does not mean that you have to avoid being intimate with your partner. You can still have close contact with your partner and  continue to enjoy  sex.  Animals If you have cats or birds we just ask that you not change the litter or change the cage.  Please have someone else do this for you while you are on chemotherapy.   Food Safety During and After Cancer Treatment Food safety is important for people both during and after cancer treatment. Cancer and cancer treatments, such as chemotherapy, radiation therapy, and stem cell/bone marrow transplantation, often weaken the immune system. This makes it harder for your body to protect itself from foodborne illness, also called food poisoning. Foodborne illness is caused by eating food that contains harmful bacteria, parasites, or viruses.  Foods to avoid Some foods have a higher risk of becoming tainted with bacteria. These include: Marland Kitchen Unwashed fresh fruit and vegetables, especially leafy vegetables that can hide dirt and other contaminants . Raw sprouts, such as alfalfa sprouts . Raw or undercooked beef, especially ground beef, or other raw or undercooked meat and poultry . Fatty, fried, or spicy foods immediately before or after treatment.  These can sit heavy on your stomach and make you feel nauseous. . Raw or undercooked shellfish, such as oysters. . Sushi and sashimi, which often contain raw fish.  . Unpasteurized beverages, such as unpasteurized fruit juices, raw milk, raw yogurt, or cider . Undercooked eggs, such as soft boiled, over easy, and poached; raw, unpasteurized eggs; or foods made with raw egg, such as homemade raw cookie dough and homemade mayonnaise  Simple steps for food safety  Shop smart. . Do not buy food stored or displayed in an unclean area. . Do not buy bruised or damaged fruits or vegetables. . Do not buy cans that have cracks, dents, or bulges. . Pick up foods that can spoil at the end of your shopping trip and store them in a cooler on the way home.  Prepare and clean up foods carefully. . Rinse all fresh fruits and vegetables under  running water, and dry them with a clean towel or paper towel. . Clean the top of cans before opening them. . After preparing food, wash your hands for 20 seconds with hot water and soap. Pay special attention to areas between fingers and under nails. . Clean your utensils and dishes with hot water and soap. Marland Kitchen Disinfect your kitchen and cutting boards using 1 teaspoon of liquid, unscented bleach mixed into 1 quart of water.    Dispose of old food. . Eat canned and packaged food before its expiration date (the "use by" or "best before" date). . Consume refrigerated leftovers within 3 to 4 days. After that time, throw out the food. Even if the food does not smell or look spoiled, it still may be unsafe. Some bacteria, such as Listeria, can grow even on foods stored in the refrigerator if they are kept for too long.  Take precautions when eating out. . At restaurants, avoid buffets and salad bars where food sits out for a long time and comes in contact with many people. Food can become contaminated when someone with a virus, often a norovirus, or another "bug" handles it. . Put any leftover food in a "to-go" container yourself, rather than having the server do it. And, refrigerate leftovers as soon as you get home. . Choose restaurants that are clean and that are willing to prepare your food as you order it cooked.   MEDICATIONS:  Compazine/Prochlorperazine 10mg  tablet. Take 1 tablet every 6 hours as needed for nausea/vomiting. (This can make you sleepy)   EMLA cream. Apply a quarter size amount to port site 1 hour prior to chemo. Do not rub in. Cover with plastic wrap.   Over-the-Counter Meds:  Colace - 100 mg capsules - take 2 capsules daily.  If this doesn't help then you can increase to 2 capsules twice daily.  Call us if this does  not help your bowels move.   Imodium 2mg  capsule. Take 2 capsules after the 1st loose stool and then 1 capsule every 2 hours until you go a total of 12 hours without having a loose stool. Call the [facility] if loose stools continue. If diarrhea occurs at bedtime, take 2 capsules at bedtime. Then take 2 capsules every 4 hours until morning. Call [facility].    Diarrhea Sheet   If you are having loose stools/diarrhea, please purchase Imodium and begin taking as outlined:  At the first sign of poorly formed or loose stools you should begin taking Imodium (loperamide) 2 mg capsules.  Take two tablets (4mg ) followed by one tablet (2mg ) every 2 hours - DO NOT EXCEED 8 tablets in 24 hours.  If it is bedtime and you are having loose stools, take 2 tablets at bedtime, then 2 tablets every 4 hours until morning.   Always call the [facility] if you are having loose stools/diarrhea that you can't get under control.  Loose stools/diarrhea leads to dehydration (loss of water) in your body.  We have other options of trying to get the loose stools/diarrhea to stop but you must let us know!   Constipation Sheet  Colace - 100 mg capsules - take 2 capsules daily.  If this doesn't help then you can increase to 2 capsules twice daily.  Please call if the above does not work for you.   Do not go more than 2 days without a bowel movement.  It is very important that you do not become constipated.  It will make you feel sick to your stomach (nausea) and can cause abdominal pain and vomiting.   Nausea Sheet   Compazine/Prochlorperazine 10mg  tablet. Take 1 tablet every 6 hours as needed for nausea/vomiting. (This can make you sleepy)  If you are having persistent nausea (nausea that does not stop) please call the [facility] and let [place] know the amount of nausea that you are experiencing.  If you begin to vomit, you need to call the [facility] and if it is the weekend and you have vomited more than one  time and can't get it to stop-go to the Emergency Room.  Persistent nausea/vomiting can lead to dehydration (loss of fluid in your body) and will make you feel terrible.   Ice chips, sips of clear liquids, foods that are @ room temperature, crackers, and toast tend to be better tolerated.   SYMPTOMS TO REPORT AS SOON AS POSSIBLE AFTER TREATMENT:   FEVER GREATER THAN 100.5 F  CHILLS WITH OR WITHOUT FEVER  NAUSEA AND VOMITING THAT IS NOT CONTROLLED WITH YOUR NAUSEA MEDICATION  UNUSUAL SHORTNESS OF BREATH  UNUSUAL BRUISING OR BLEEDING  TENDERNESS IN MOUTH AND THROAT WITH OR WITHOUT PRESENCE OF ULCERS  URINARY PROBLEMS  BOWEL PROBLEMS  UNUSUAL RASH      Wear comfortable clothing and clothing appropriate for easy access to any Portacath or PICC line. Let us know if there is anything that we can do to make your  therapy better!    What to do if you need assistance after hours or on the weekends: CALL [phone].  HOLD on the line, do not hang up.  You will hear multiple messages but at the end you will be connected with a nurse triage line.  They will contact the doctor if necessary.  Most of the time they will be able to assist you.  Do not call the hospital operator.      I have been informed and understand all of the instructions given to me and have received a copy. I have been instructed to call the clinic [phone] or my family physician as soon as possible for continued medical care, if indicated. I do not have any more questions at this time but understand that I may call the [facility] or the Patient Navigator at [phone] during office hours should I have questions or need assistance in obtaining follow-up care.

## 2018-08-19 NOTE — H&P (Signed)
Richard Wallace is an 73 y.o. male.   Chief Complaint: Pancytopenia, need for central venous access HPI: Patient is a 73 year old white male who was referred to my care by Dr. Delton Coombes of oncology for Port-A-Cath insertion.  He has pancytopenia and requires frequent blood draws and blood products.  He currently has no pain.  Past Medical History:  Diagnosis Date  . Arthritis   . 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 Cancer ([place]) 01-28-2014   skin cancer  . CKD (chronic kidney disease), stage III ([place])   . GERD (gastroesophageal reflux disease)   . Heart murmur    NOTED DURING PHYSICAL WHEN HE WAS ENLISTING IN MILITARY , DIDNT KNOW UNTIL THAT TIME AND REPORTS , "THATS THE LAST I HEARD ABOUT IT "   . History of hypertension    no longer issue  . History of kidney stones   . History of malignant melanoma of skin    excision top of scalp 2015-- no recurrence  . History of urinary retention    post op lumbar fusion surgery 04/ 2016  . Hypertension   . 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 Left ureteral calculus   . Seasonal allergies   . Wears glasses   . Wears glasses   . Wears partial dentures    upper and lower    Past Surgical History:  Procedure Laterality Date  . ANKLE FUSION Right 2007  . CARPAL TUNNEL RELEASE Left 12/28/2009   w/ pulley release left long finger  . CARPAL TUNNEL RELEASE Right 07/22/2013   Procedure: RIGHT CARPAL TUNNEL RELEASE;  Surgeon: Cammie Sickle., MD;  Location: [facility];  Service: Orthopedics;  Laterality: Right;  . COLONOSCOPY    . CYSTO/  LEFT RETROGRADE PYELOGRAM  11/21/2010  . CYSTOSCOPY WITH STENT PLACEMENT Left 03/09/2016   Procedure: CYSTOSCOPY WITH STENT PLACEMENT;  Surgeon: Franchot Gallo, MD;  Location: [facility];  Service: Urology;  Laterality: Left;  . CYSTOSCOPY/RETROGRADE/URETEROSCOPY/STONE EXTRACTION WITH BASKET  Left 03/09/2016   Procedure: CYSTOSCOPY/RETROGRADE/URETEROSCOPY/STONE EXTRACTION WITH BASKET;  Surgeon: Franchot Gallo, MD;  Location: [facility];  Service: Urology;  Laterality: Left;  . LEFT URETEROSCOPIC LASER LITHOTRIPSY STONE EXTRACTION/ STENT PLACEMENT  05/23/2010  . MOHS SURGERY     TOP OF THE HEAD   . ORIF ANKLE FRACTURE Right 1978  . POSTERIOR LUMBAR FUSION  08/21/2014   laminectomy and decompression L2 -- L5  . RIGHT LOWER LEG SURGERY  X3  1975 to 1976   including ORIF  . TONSILLECTOMY AND ADENOIDECTOMY  1986  . UMBILICAL HERNIA REPAIR  2009 approx    Family History  Problem Relation Age of Onset  . Stroke Mother   . Prostate cancer Father   . Bone cancer Father   . Rheum arthritis Sister   . Urinary tract infection Sister    Social History:  reports that he quit smoking about 32 years ago. His smoking use included cigarettes. He quit after 20.00 years of use. He has never used smokeless tobacco. He reports current alcohol use of about 7.0 - 14.0 standard drinks of alcohol per week. He reports that he does not use drugs.  Allergies: No Known Allergies  Medications Prior to Admission  Medication Sig Dispense Refill  . acetaminophen (TYLENOL) 500 MG tablet Take 1,000 mg by mouth every 6 (six) hours as needed for mild pain  or moderate pain.     Marland Kitchen allopurinol (ZYLOPRIM) 300 MG tablet Take 300 mg by mouth every evening.    . celecoxib (CELEBREX) 200 MG capsule Take 200 mg by mouth daily. IN THE MORNING    . cetirizine (ZYRTEC) 10 MG tablet Take 10 mg by mouth daily. IN THE MORNING    . diclofenac sodium (VOLTAREN) 1 % GEL Apply 1 g topically 3 (three) times daily as needed (knee pain.).     Marland Kitchen docusate sodium (COLACE) 100 MG capsule Take 100 mg by mouth at bedtime.     . fluticasone (FLONASE) 50 MCG/ACT nasal spray Place 1 spray into both nostrils daily. In the morning    . lansoprazole (PREVACID) 15 MG capsule Take 15 mg by mouth daily before breakfast.      . lisinopril (PRINIVIL,ZESTRIL) 5 MG tablet Take 2.5 mg by mouth daily. In the morning    . magnesium oxide (MAG-OX) 400 MG tablet Take 400 mg by mouth at bedtime.     Vladimir Faster Glycol-Propyl Glycol (SYSTANE OP) Place 1 drop into both eyes 2 (two) times daily.    . tamsulosin (FLOMAX) 0.4 MG CAPS capsule Take 0.4 mg by mouth every evening.     . traMADol (ULTRAM) 50 MG tablet Take 50 mg by mouth 3 (three) times daily as needed (pain.).       No results found for this or any previous visit (from the past 48 hour(s)). No results found.  Review of Systems  Constitutional: Positive for malaise/fatigue.  HENT: Negative.   Eyes: Negative.   Respiratory: Negative.   Cardiovascular: Negative.   Gastrointestinal: Negative.   Genitourinary: Negative.   Musculoskeletal: Negative.   Skin: Negative.   Neurological: Negative.   Endo/Heme/Allergies: Bruises/bleeds easily.  Psychiatric/Behavioral: Negative.     Blood pressure 136/75, pulse 84, temperature 98.3 F (36.8 C), temperature source Oral, resp. rate 16, height 5' 9.5" (1.765 m), weight 102.1 kg, SpO2 97 %. Physical Exam  Vitals reviewed. Constitutional: He is oriented to person, place, and time. He appears well-developed and well-nourished. No distress.  HENT:  Head: Normocephalic and atraumatic.  Cardiovascular: Normal rate, regular rhythm and normal heart sounds. Exam reveals no gallop and no friction rub.  No murmur heard. Respiratory: Effort normal and breath sounds normal. No respiratory distress. He has no wheezes. He has no rales.  Neurological: He is alert and oriented to person, place, and time.  Skin: Skin is warm and dry.    Dr. Tomie China notes reviewed Assessment/Plan Impression: Pancytopenia, need for central venous access Plan: Patient will undergo Port-A-Cath insertion on 08/19/2018.  The risks and benefits of the procedure including bleeding, infection, and pneumothorax were fully explained to the patient, who  gave informed consent.  Due to the protocol for the COVID-19 pandemic, patient was seen just prior to the surgery.    Aviva Signs, MD 08/19/2018, 8:12 AM

## 2018-08-19 NOTE — Op Note (Signed)
Patient:  Richard Wallace  DOB:  Sep 28, 1945  MRN:  [id number]   Preop Diagnosis: Pancytopenia, need for central venous access  Postop Diagnosis: Same  Procedure: Port-A-Cath insertion  Surgeon: Aviva Signs, MD  Anes: MAC  Indications: Patient is a 73 year old white male who presents for Port-A-Cath insertion due to need for central venous access for pancytopenia.  The risks and benefits of the procedure including bleeding, infection, and the possibility of pneumothorax were fully explained to the patient, who gave informed consent.  Procedure note: The patient was placed in the Trendelenburg position.  The right upper chest was prepped and draped using the usual sterile technique with ChloraPrep.  Surgical site confirmation was performed.  1% Xylocaine was used for local anesthesia.  An incision was made below the right clavicle.  A subcutaneous pocket was formed.  A needle was advanced into the right subclavian vein using the Seldinger technique without difficulty.  A guidewire was then advanced into the right atrium under fluoroscopic guidance.  An introducer and peel-away sheath were placed over the guidewire.  The catheter was inserted through the peel-away sheath and the peel-away sheath was removed.  The catheter was then attached to the port and the port placed in subcutaneous pocket.  Adequate positioning was confirmed by fluoroscopy.  Good backflow of venous blood was noted on aspiration of the port.  Port was flushed with heparin flush.  The subcutaneous layer was reapproximated using a 3-0 Vicryl interrupted suture.  The skin was closed using a 4-0 Monocryl subcuticular suture.  Dermabond was applied.  All tape and needle counts were correct at the end of the procedure.  The patient was transferred to PACU in stable condition.  A chest x-ray will be performed at that time.  Complications: None  EBL: Minimal  Specimen: None

## 2018-08-19 NOTE — Discharge Instructions (Signed)

## 2018-08-20 ENCOUNTER — Encounter (HOSPITAL_COMMUNITY): Payer: Self-pay | Admitting: General Surgery

## 2018-08-21 ENCOUNTER — Other Ambulatory Visit: Payer: Self-pay

## 2018-08-22 ENCOUNTER — Inpatient Hospital Stay (HOSPITAL_COMMUNITY): Payer: Medicare Other

## 2018-08-22 ENCOUNTER — Encounter (HOSPITAL_COMMUNITY): Payer: Self-pay

## 2018-08-22 VITALS — BP 120/65 | HR 79 | Temp 98.0°F | Resp 18 | Wt 231.4 lb

## 2018-08-22 DIAGNOSIS — D46Z Other myelodysplastic syndromes: Secondary | ICD-10-CM

## 2018-08-22 DIAGNOSIS — Z95828 Presence of other vascular implants and grafts: Secondary | ICD-10-CM

## 2018-08-22 DIAGNOSIS — D61818 Other pancytopenia: Secondary | ICD-10-CM

## 2018-08-22 LAB — CBC WITH DIFFERENTIAL/PLATELET
Abs Immature Granulocytes: 0.01 10*3/uL (ref 0.00–0.07)
Basophils Absolute: 0 10*3/uL (ref 0.0–0.1)
Basophils Relative: 0 %
Eosinophils Absolute: 0 10*3/uL (ref 0.0–0.5)
Eosinophils Relative: 0 %
HCT: 22.7 % — ABNORMAL LOW (ref 39.0–52.0)
Hemoglobin: 7.1 g/dL — ABNORMAL LOW (ref 13.0–17.0)
Immature Granulocytes: 1 %
Lymphocytes Relative: 52 %
Lymphs Abs: 0.8 10*3/uL (ref 0.7–4.0)
MCH: 34.3 pg — ABNORMAL HIGH (ref 26.0–34.0)
MCHC: 31.3 g/dL (ref 30.0–36.0)
MCV: 109.7 fL — ABNORMAL HIGH (ref 80.0–100.0)
Monocytes Absolute: 0.1 10*3/uL (ref 0.1–1.0)
Monocytes Relative: 5 %
Neutro Abs: 0.6 10*3/uL — ABNORMAL LOW (ref 1.7–7.7)
Neutrophils Relative %: 42 %
Platelets: 44 10*3/uL — ABNORMAL LOW (ref 150–400)
RBC: 2.07 MIL/uL — ABNORMAL LOW (ref 4.22–5.81)
RDW: 19.9 % — ABNORMAL HIGH (ref 11.5–15.5)
WBC: 1.5 10*3/uL — ABNORMAL LOW (ref 4.0–10.5)
nRBC: 2.1 % — ABNORMAL HIGH (ref 0.0–0.2)

## 2018-08-22 LAB — COMPREHENSIVE METABOLIC PANEL
ALT: 23 U/L (ref 0–44)
AST: 74 U/L — ABNORMAL HIGH (ref 15–41)
Albumin: 3.8 g/dL (ref 3.5–5.0)
Alkaline Phosphatase: 54 U/L (ref 38–126)
Anion gap: 10 (ref 5–15)
BUN: 27 mg/dL — ABNORMAL HIGH (ref 8–23)
CO2: 22 mmol/L (ref 22–32)
Calcium: 9.2 mg/dL (ref 8.9–10.3)
Chloride: 107 mmol/L (ref 98–111)
Creatinine, Ser: 1.52 mg/dL — ABNORMAL HIGH (ref 0.61–1.24)
GFR calc Af Amer: 52 mL/min — ABNORMAL LOW (ref >60)
GFR calc non Af Amer: 45 mL/min — ABNORMAL LOW (ref >60)
Glucose, Bld: 117 mg/dL — ABNORMAL HIGH (ref 70–99)
Potassium: 5.7 mmol/L — ABNORMAL HIGH (ref 3.5–5.1)
Sodium: 139 mmol/L (ref 135–145)
Total Bilirubin: 1.6 mg/dL — ABNORMAL HIGH (ref 0.3–1.2)
Total Protein: 6.6 g/dL (ref 6.5–8.1)

## 2018-08-22 MED ORDER — PALONOSETRON HCL INJECTION 0.25 MG/5ML
0.2500 mg | Freq: Once | INTRAVENOUS | Status: AC
  Administered 2018-08-22: 0.25 mg via INTRAVENOUS
  Filled 2018-08-22: qty 5

## 2018-08-22 MED ORDER — SODIUM POLYSTYRENE SULFONATE 15 GM/60ML PO SUSP
30.0000 g | Freq: Once | ORAL | Status: AC
  Administered 2018-08-22: 30 g via ORAL
  Filled 2018-08-22: qty 120

## 2018-08-22 MED ORDER — SODIUM CHLORIDE 0.9 % IV SOLN
Freq: Once | INTRAVENOUS | Status: AC
  Administered 2018-08-22: 12:00:00 via INTRAVENOUS

## 2018-08-22 MED ORDER — SODIUM CHLORIDE 0.9 % IV SOLN
100.0000 mg | Freq: Once | INTRAVENOUS | Status: AC
  Administered 2018-08-22: 100 mg via INTRAVENOUS
  Filled 2018-08-22: qty 10

## 2018-08-22 MED ORDER — SODIUM CHLORIDE 0.9% FLUSH
10.0000 mL | INTRAVENOUS | Status: DC | PRN
  Administered 2018-08-22: 10 mL
  Filled 2018-08-22: qty 10

## 2018-08-22 NOTE — Progress Notes (Signed)
Labs drawn peripherally due to power port bruising and swelling.

## 2018-08-22 NOTE — Patient Instructions (Signed)
[facility] Discharge Instructions for Patients Receiving Chemotherapy  Today you received the following chemotherapy agents  If you develop nausea and vomiting that is not controlled by your nausea medication, call the clinic.   BELOW ARE SYMPTOMS THAT SHOULD BE REPORTED IMMEDIATELY:  *FEVER GREATER THAN 100.5 F  *CHILLS WITH OR WITHOUT FEVER  NAUSEA AND VOMITING THAT IS NOT CONTROLLED WITH YOUR NAUSEA MEDICATION  *UNUSUAL SHORTNESS OF BREATH  *UNUSUAL BRUISING OR BLEEDING  TENDERNESS IN MOUTH AND THROAT WITH OR WITHOUT PRESENCE OF ULCERS  *URINARY PROBLEMS  *BOWEL PROBLEMS  UNUSUAL RASH Items with * indicate a potential emergency and should be followed up as soon as possible.  Feel free to call the clinic should you have any questions or concerns. The clinic phone number is [phone].  Please show the CHEMO ALERT CARD at check-in to the Emergency Department and triage nurse.   

## 2018-08-22 NOTE — Progress Notes (Signed)
Patient to treatment area for day 1 of vidaza.  Patients port noted with bruising and swelling with clotted/drainage noted with incision.  Dr. Delton Coombes in to examine the port with verbal order to treat with chemotherapy peripherally today.  Pharmacy notified.   Reviewed labs with Dr. Delton Coombes with verbal order ok to treat today.  Cycle 1 to be reduced, check labs on Monday, to be seen in 2 weeks with the oncologist with labs, and give Kayexalate to take at home for Potassium 5.7 verbal order Dr. Delton Coombes.   Total bili 1.6, potassium 5.7, WBC 1.5, ANC 0.6 and Ser Creat 1.52 for todays treatment.  Pharmacy notified.    1250-good blood return noted before the start of Vidaza.   Patient tolerated treatment with no complaints voiced.  Peripheral IV site clean and dry with good blood return noted after infusion.  No bruising or swelling noted at site.  Dressing intact.  VSS with discharge and left ambulatory with no s/s of distress noted.  Kayexalate given and reviewed with understanding verbalized.

## 2018-08-23 ENCOUNTER — Inpatient Hospital Stay (HOSPITAL_COMMUNITY): Payer: Medicare Other | Attending: Hematology

## 2018-08-23 ENCOUNTER — Encounter (HOSPITAL_COMMUNITY): Payer: Self-pay

## 2018-08-23 ENCOUNTER — Other Ambulatory Visit: Payer: Self-pay

## 2018-08-23 VITALS — BP 117/59 | HR 81 | Temp 98.0°F | Resp 18

## 2018-08-23 DIAGNOSIS — Z95828 Presence of other vascular implants and grafts: Secondary | ICD-10-CM

## 2018-08-23 DIAGNOSIS — D61818 Other pancytopenia: Secondary | ICD-10-CM | POA: Diagnosis present

## 2018-08-23 DIAGNOSIS — D46Z Other myelodysplastic syndromes: Secondary | ICD-10-CM

## 2018-08-23 MED ORDER — HEPARIN SOD (PORK) LOCK FLUSH 100 UNIT/ML IV SOLN: 500.0000 [IU] | Freq: Once | INTRAVENOUS | Status: DC | PRN

## 2018-08-23 MED ORDER — SODIUM CHLORIDE 0.9% FLUSH
10.0000 mL | INTRAVENOUS | Status: DC | PRN
  Administered 2018-08-23: 10 mL
  Filled 2018-08-23: qty 10

## 2018-08-23 MED ORDER — SODIUM CHLORIDE 0.9 % IV SOLN
100.0000 mg | Freq: Once | INTRAVENOUS | Status: AC
  Administered 2018-08-23: 100 mg via INTRAVENOUS
  Filled 2018-08-23: qty 10

## 2018-08-23 MED ORDER — SODIUM CHLORIDE 0.9 % IV SOLN
Freq: Once | INTRAVENOUS | Status: AC
  Administered 2018-08-23: 10:00:00 via INTRAVENOUS

## 2018-08-23 NOTE — Progress Notes (Signed)
Pt presents today for Day 2 Vidaza. VSS. Pt complains of heart rate increased at home and light sensitivity when he goes outside. Pt teaching performed.   Reported pt's complaints to Dr. Delton Coombes. Proceed with treatment per MD.   SL 24G in the L forearm. Flushes without resistance, blood return noted.   Treatment given today per MD orders. Tolerated infusion without adverse affects. Vital signs stable. No complaints at this time. Discharged from clinic ambulatory. F/U with [facility] as scheduled.

## 2018-08-23 NOTE — Patient Instructions (Signed)
[facility] Discharge Instructions for Patients Receiving Chemotherapy  Today you received the following chemotherapy agents   To help prevent nausea and vomiting after your treatment, we encourage you to take your nausea medication   If you develop nausea and vomiting that is not controlled by your nausea medication, call the clinic.   BELOW ARE SYMPTOMS THAT SHOULD BE REPORTED IMMEDIATELY:  *FEVER GREATER THAN 100.5 F  *CHILLS WITH OR WITHOUT FEVER  NAUSEA AND VOMITING THAT IS NOT CONTROLLED WITH YOUR NAUSEA MEDICATION  *UNUSUAL SHORTNESS OF BREATH  *UNUSUAL BRUISING OR BLEEDING  TENDERNESS IN MOUTH AND THROAT WITH OR WITHOUT PRESENCE OF ULCERS  *URINARY PROBLEMS  *BOWEL PROBLEMS  UNUSUAL RASH Items with * indicate a potential emergency and should be followed up as soon as possible.  Feel free to call the clinic should you have any questions or concerns. The clinic phone number is [phone].  Please show the CHEMO ALERT CARD at check-in to the Emergency Department and triage nurse.   

## 2018-08-26 ENCOUNTER — Other Ambulatory Visit: Payer: Self-pay

## 2018-08-26 ENCOUNTER — Inpatient Hospital Stay (HOSPITAL_COMMUNITY): Payer: Medicare Other

## 2018-08-26 ENCOUNTER — Other Ambulatory Visit (HOSPITAL_COMMUNITY): Payer: Medicare Other

## 2018-08-26 ENCOUNTER — Encounter (HOSPITAL_COMMUNITY): Payer: Self-pay | Admitting: Hematology

## 2018-08-26 ENCOUNTER — Inpatient Hospital Stay (HOSPITAL_BASED_OUTPATIENT_CLINIC_OR_DEPARTMENT_OTHER): Payer: Medicare Other | Admitting: Hematology

## 2018-08-26 VITALS — BP 124/80 | HR 73 | Temp 98.2°F | Resp 18

## 2018-08-26 VITALS — BP 112/60 | HR 105 | Temp 98.7°F | Resp 18 | Wt 234.0 lb

## 2018-08-26 DIAGNOSIS — D46Z Other myelodysplastic syndromes: Secondary | ICD-10-CM

## 2018-08-26 DIAGNOSIS — Z95828 Presence of other vascular implants and grafts: Secondary | ICD-10-CM

## 2018-08-26 DIAGNOSIS — D61818 Other pancytopenia: Secondary | ICD-10-CM | POA: Diagnosis not present

## 2018-08-26 LAB — COMPREHENSIVE METABOLIC PANEL
ALT: 27 U/L (ref 0–44)
AST: 28 U/L (ref 15–41)
Albumin: 3.7 g/dL (ref 3.5–5.0)
Alkaline Phosphatase: 53 U/L (ref 38–126)
Anion gap: 7 (ref 5–15)
BUN: 32 mg/dL — ABNORMAL HIGH (ref 8–23)
CO2: 25 mmol/L (ref 22–32)
Calcium: 9.2 mg/dL (ref 8.9–10.3)
Chloride: 107 mmol/L (ref 98–111)
Creatinine, Ser: 1.39 mg/dL — ABNORMAL HIGH (ref 0.61–1.24)
GFR calc Af Amer: 58 mL/min — ABNORMAL LOW (ref >60)
GFR calc non Af Amer: 50 mL/min — ABNORMAL LOW (ref >60)
Glucose, Bld: 123 mg/dL — ABNORMAL HIGH (ref 70–99)
Potassium: 4.8 mmol/L (ref 3.5–5.1)
Sodium: 139 mmol/L (ref 135–145)
Total Bilirubin: 0.8 mg/dL (ref 0.3–1.2)
Total Protein: 6.3 g/dL — ABNORMAL LOW (ref 6.5–8.1)

## 2018-08-26 LAB — CBC WITH DIFFERENTIAL/PLATELET
Abs Immature Granulocytes: 0 10*3/uL (ref 0.00–0.07)
Basophils Absolute: 0 10*3/uL (ref 0.0–0.1)
Basophils Relative: 0 %
Eosinophils Absolute: 0 10*3/uL (ref 0.0–0.5)
Eosinophils Relative: 0 %
HCT: 21.2 % — ABNORMAL LOW (ref 39.0–52.0)
Hemoglobin: 6.7 g/dL — CL (ref 13.0–17.0)
Immature Granulocytes: 0 %
Lymphocytes Relative: 54 %
Lymphs Abs: 0.7 10*3/uL (ref 0.7–4.0)
MCH: 35.6 pg — ABNORMAL HIGH (ref 26.0–34.0)
MCHC: 31.6 g/dL (ref 30.0–36.0)
MCV: 112.8 fL — ABNORMAL HIGH (ref 80.0–100.0)
Monocytes Absolute: 0 10*3/uL — ABNORMAL LOW (ref 0.1–1.0)
Monocytes Relative: 2 %
Neutro Abs: 0.6 10*3/uL — ABNORMAL LOW (ref 1.7–7.7)
Neutrophils Relative %: 44 %
Platelets: 43 10*3/uL — ABNORMAL LOW (ref 150–400)
RBC: 1.88 MIL/uL — ABNORMAL LOW (ref 4.22–5.81)
RDW: 19.7 % — ABNORMAL HIGH (ref 11.5–15.5)
WBC: 1.3 10*3/uL — CL (ref 4.0–10.5)
nRBC: 1.5 % — ABNORMAL HIGH (ref 0.0–0.2)

## 2018-08-26 LAB — PREPARE RBC (CROSSMATCH)

## 2018-08-26 LAB — ABO/RH: ABO/RH(D): O POS

## 2018-08-26 MED ORDER — DIPHENHYDRAMINE HCL 25 MG PO CAPS
25.0000 mg | ORAL_CAPSULE | Freq: Once | ORAL | Status: AC
  Administered 2018-08-26: 25 mg via ORAL
  Filled 2018-08-26: qty 1

## 2018-08-26 MED ORDER — SODIUM CHLORIDE 0.9% FLUSH
10.0000 mL | INTRAVENOUS | Status: DC | PRN
  Administered 2018-08-26: 10 mL
  Filled 2018-08-26: qty 10

## 2018-08-26 MED ORDER — HEPARIN SOD (PORK) LOCK FLUSH 100 UNIT/ML IV SOLN
500.0000 [IU] | Freq: Once | INTRAVENOUS | Status: AC | PRN
  Administered 2018-08-26: 500 [IU]

## 2018-08-26 MED ORDER — ACETAMINOPHEN 325 MG PO TABS
650.0000 mg | ORAL_TABLET | Freq: Once | ORAL | Status: AC
  Administered 2018-08-26: 650 mg via ORAL
  Filled 2018-08-26: qty 2

## 2018-08-26 MED ORDER — SODIUM CHLORIDE 0.9 % IV SOLN
100.0000 mg | Freq: Once | INTRAVENOUS | Status: AC
  Administered 2018-08-26: 100 mg via INTRAVENOUS
  Filled 2018-08-26: qty 10

## 2018-08-26 MED ORDER — SODIUM CHLORIDE 0.9 % IV SOLN
Freq: Once | INTRAVENOUS | Status: AC
  Administered 2018-08-26: 11:00:00 via INTRAVENOUS

## 2018-08-26 MED ORDER — SODIUM CHLORIDE 0.9% IV SOLUTION
250.0000 mL | Freq: Once | INTRAVENOUS | Status: AC
  Administered 2018-08-26: 250 mL via INTRAVENOUS

## 2018-08-26 MED ORDER — PALONOSETRON HCL INJECTION 0.25 MG/5ML
0.2500 mg | Freq: Once | INTRAVENOUS | Status: AC
  Administered 2018-08-26: 0.25 mg via INTRAVENOUS
  Filled 2018-08-26: qty 5

## 2018-08-26 NOTE — Progress Notes (Signed)
08/26/18  Ok to proceed with HR 105 today.  Dr Rhys Martini, PharmD

## 2018-08-26 NOTE — Progress Notes (Signed)
[facility] [address], [place] [address]   CLINIC:  Medical Oncology/Hematology  PCP:  [name], MD [address] [place] [address] [phone]   REASON FOR VISIT:  Follow-up for pancytopenia and bone marrow biopsy results.   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       CANCER STAGING: Cancer Staging No matching staging information was found for the patient.   INTERVAL HISTORY:  [name] 73 y.o. male returns for routine follow-up and consideration for next cycle of chemotherapy. He is here today alone. he states that he has felt good since his last visit. Denies any nausea, vomiting, or diarrhea. Denies any new pains. Had not noticed any recent bleeding such as epistaxis, hematuria or hematochezia. Denies recent chest pain on exertion, shortness of breath on minimal exertion, pre-syncopal episodes, or palpitations. Denies any numbness or tingling in hands or feet. Denies any recent fevers, infections, or recent hospitalizations. Patient reports appetite at 100% and energy level at 0%.    REVIEW OF SYSTEMS:  Review of Systems  Constitutional: Positive for fatigue.  All other systems reviewed and are negative.    PAST MEDICAL/SURGICAL  HISTORY:  Past Medical History:  Diagnosis Date  . Arthritis   . 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 Cancer ([place]) 01-28-2014   skin cancer  . CKD (chronic kidney disease), stage III ([place])   . GERD (gastroesophageal reflux disease)   . Heart murmur    NOTED DURING PHYSICAL WHEN HE WAS ENLISTING IN [place]ITARY , DIDNT KNOW UNTIL THAT TIME AND REPORTS , "THATS THE LAST I HEARD ABOUT IT "   . History of hypertension    no longer issue  . History of kidney stones   . History of malignant melanoma of skin    excision top of scalp 2015-- no recurrence  . History of urinary retention    post op lumbar fusion surgery 04/ 2016  . Hypertension   . 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 Left ureteral calculus   . Seasonal allergies   . Wears glasses   . Wears glasses   . Wears partial dentures    upper and lower   Past Surgical History:  Procedure Laterality Date  . ANKLE FUSION Right 2007  . CARPAL TUNNEL RELEASE Left 12/28/2009   w/ pulley release left long finger  . CARPAL TUNNEL RELEASE Right 07/22/2013   Procedure: RIGHT CARPAL TUNNEL RELEASE;  Surgeon: Cammie Sickle., MD;  Location: [facility];  Service: Orthopedics;  Laterality: Right;  . COLONOSCOPY    . CYSTO/  LEFT RETROGRADE PYELOGRAM  11/21/2010  . CYSTOSCOPY WITH STENT PLACEMENT Left 03/09/2016  Procedure: CYSTOSCOPY WITH STENT PLACEMENT;  Surgeon: Franchot Gallo, MD;  Location: [facility];  Service: Urology;  Laterality: Left;  . CYSTOSCOPY/RETROGRADE/URETEROSCOPY/STONE EXTRACTION WITH BASKET Left 03/09/2016   Procedure: CYSTOSCOPY/RETROGRADE/URETEROSCOPY/STONE EXTRACTION WITH BASKET;  Surgeon: Franchot Gallo, MD;  Location: [facility];  Service: Urology;  Laterality: Left;  . LEFT URETEROSCOPIC LASER LITHOTRIPSY STONE EXTRACTION/ STENT PLACEMENT  05/23/2010  . MOHS SURGERY     TOP OF  THE HEAD   . ORIF ANKLE FRACTURE Right 1978  . PORTACATH PLACEMENT Right 08/19/2018   Procedure: INSERTION PORT-A-CATH (attached catheter in right subclavian);  Surgeon: Aviva Signs, MD;  Location: [facility];  Service: General;  Laterality: Right;  . POSTERIOR LUMBAR FUSION  08/21/2014   laminectomy and decompression L2 -- L5  . RIGHT LOWER LEG SURGERY  X3  1975 to 1976   including ORIF  . TONSILLECTOMY AND ADENOIDECTOMY  1986  . UMBILICAL HERNIA REPAIR  2009 approx     SOCIAL HISTORY:  Social History   Socioeconomic History  . Marital status: Married    Spouse name: Not on file  . Number of children: Not on file  . Years of education: Not on file  . Highest education level: Not on file  Occupational History  . Not on file  Social Needs  . Financial resource strain: Not on file  . Food insecurity:    Worry: Not on file    Inability: Not on file  . Transportation needs:    Medical: Not on file    Non-medical: Not on file  Tobacco Use  . Smoking status: Former Smoker    Years: 20.00    Types: Cigarettes    Last attempt to quit: 07/17/1986    Years since quitting: 32.1  . Smokeless tobacco: Never Used  Substance and Sexual Activity  . Alcohol use: Yes    Alcohol/week: 7.0 - 14.0 standard drinks    Types: 7 - 14 Cans of beer per week    Comment: 1 -2 beer daily  . Drug use: No  . Sexual activity: Not on file  Lifestyle  . Physical activity:    Days per week: Not on file    Minutes per session: Not on file  . Stress: Not on file  Relationships  . Social connections:    Talks on phone: Not on file    Gets together: Not on file    Attends religious service: Not on file    Active member of club or organization: Not on file    Attends meetings of clubs or organizations: Not on file    Relationship status: Not on file  . Intimate partner violence:    Fear of current or ex partner: Not on file    Emotionally abused: Not on file    Physically abused: Not on file     Forced sexual activity: Not on file  Other Topics Concern  . Not on file  Social History Narrative  . Not on file    FAMILY HISTORY:  Family History  Problem Relation Age of Onset  . Stroke Mother   . Prostate cancer Father   . Bone cancer Father   . Rheum arthritis Sister   . Urinary tract infection Sister     CURRENT MEDICATIONS:  Outpatient Encounter Medications as of 08/26/2018  Medication Sig  . acetaminophen (TYLENOL) 500 MG tablet Take 1,000 mg by mouth every 6 (six) hours as needed for mild pain or moderate pain.   Marland Kitchen  allopurinol (ZYLOPRIM) 300 MG tablet Take 300 mg by mouth every evening.  Marland Kitchen azaCITIDine 5 mg/2 mLs in lactated ringers infusion Inject into the vein daily. Day 1-7 every 28 days (starting 08/22/2018)  . celecoxib (CELEBREX) 200 MG capsule Take 200 mg by mouth daily. IN THE MORNING  . cetirizine (ZYRTEC) 10 MG tablet Take 10 mg by mouth daily. IN THE MORNING  . diclofenac sodium (VOLTAREN) 1 % GEL Apply 1 g topically 3 (three) times daily as needed (knee pain.).   Marland Kitchen docusate sodium (COLACE) 100 MG capsule Take 100 mg by mouth at bedtime.   . fluticasone (FLONASE) 50 MCG/ACT nasal spray Place 1 spray into both nostrils daily. In the morning  . HYDROcodone-acetaminophen (NORCO) 5-325 MG tablet Take 1 tablet by mouth every 4 (four) hours as needed for moderate pain.  Marland Kitchen lansoprazole (PREVACID) 15 MG capsule Take 15 mg by mouth daily before breakfast.   . lidocaine-prilocaine (EMLA) cream Apply pea-sized amount to port a cath site and cover with plastic wrap one hour prior to chemotherapy appointment  . lisinopril (PRINIVIL,ZESTRIL) 5 MG tablet Take 2.5 mg by mouth daily. In the morning  . magnesium oxide (MAG-OX) 400 MG tablet Take 400 mg by mouth at bedtime.   Vladimir Faster Glycol-Propyl Glycol (SYSTANE OP) Place 1 drop into both eyes 2 (two) times daily.  . prochlorperazine (COMPAZINE) 10 MG tablet Take 1 tablet (10 mg total) by mouth every 6 (six) hours as needed  (Nausea or vomiting).  . tamsulosin (FLOMAX) 0.4 MG CAPS capsule Take 0.4 mg by mouth every evening.   . traMADol (ULTRAM) 50 MG tablet Take 50 mg by mouth 3 (three) times daily as needed (pain.).    No facility-administered encounter medications on file as of 08/26/2018.     ALLERGIES:  No Known Allergies   PHYSICAL EXAM:  ECOG Performance status: 1  Vitals:   08/26/18 0930  BP: 112/60  Pulse: (!) 105  Resp: 18  Temp: 98.7 F (37.1 C)  SpO2: 95%   Filed Weights   08/26/18 0930  Weight: 234 lb (106.1 kg)    Physical Exam Vitals signs reviewed.  Constitutional:      Appearance: Normal appearance.  Cardiovascular:     Rate and Rhythm: Normal rate and regular rhythm.  Pulmonary:     Effort: Pulmonary effort is normal.     Breath sounds: Normal breath sounds.  Abdominal:     General: There is no distension.     Palpations: Abdomen is soft. There is no mass.  Musculoskeletal:        General: No swelling.  Skin:    General: Skin is warm.  Neurological:     General: No focal deficit present.     Mental Status: He is oriented to person, place, and time.  Psychiatric:        Mood and Affect: Mood normal.        Behavior: Behavior normal.      LABORATORY DATA:  I have reviewed the labs as listed.  CBC    Component Value Date/Time   WBC 1.3 (LL) 08/26/2018 0944   RBC 1.88 (L) 08/26/2018 0944   HGB 6.7 (LL) 08/26/2018 0944   HCT 21.2 (L) 08/26/2018 0944   PLT 43 (L) 08/26/2018 0944   MCV 112.8 (H) 08/26/2018 0944   MCH 35.6 (H) 08/26/2018 0944   MCHC 31.6 08/26/2018 0944   RDW 19.7 (H) 08/26/2018 0944   LYMPHSABS 0.7 08/26/2018 0944   MONOABS 0.0 (  L) 08/26/2018 0944   EOSABS 0.0 08/26/2018 0944   BASOSABS 0.0 08/26/2018 0944   CMP Latest Ref Rng & Units 08/26/2018 08/22/2018 08/14/2018  Glucose 70 - 99 mg/dL 123(H) 117(H) 111(H)  BUN 8 - 23 mg/dL 32(H) 27(H) 30(H)  Creatinine 0.61 - 1.24 mg/dL 1.39(H) 1.52(H) 1.34(H)  Sodium 135 - 145 mmol/L 139 139 139   Potassium 3.5 - 5.1 mmol/L 4.8 5.7(H) 5.0  Chloride 98 - 111 mmol/L 107 107 106  CO2 22 - 32 mmol/L _0 Calcium 8.9 - 10.3 mg/dL 9.2 9.2 9.2  Total Protein 6.5 - 8.1 g/dL 6.3(L) 6.6 6.8  Total Bilirubin 0.3 - 1.2 mg/dL 0.8 1.6(H) 1.1  Alkaline Phos 38 - 126 U/L 53 54 54  AST 15 - 41 U/L 28 74(H) 28  ALT 0 - 44 U/L _1 DIAGNOSTIC IMAGING:  I have independently reviewed the scans and discussed with the patient.   I have reviewed Venita Lick LPN's note and agree with the documentation.  I personally performed a face-to-face visit, made revisions and my assessment and plan is as follows.    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  Nutritional work-up was negative. 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  Flow cytometry shows 12% blasts. 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  FISH panel is pending. 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 -I had a prolonged discussion with the patient and his wife about the normal prognosis of high-risk MDS.  His CBC today shows ANC of 600, platelets of 46 and hemoglobin which dropped to 8.7. - Vidaza at 50 mg/m started on 08/22/2018. - He did not experience any side effects over the weekend.  We reviewed his blood work.  Hemoglobin is 6.7. -We will give him 1 unit of blood transfusion. -He may proceed with his Vidaza today through Friday.  We will check his labs on a weekly basis.  We will transfuse him as needed.  2.  CKD: -He has CKD of several years duration.  Baseline creatinine is around 1.3-1.5.    Total time spent is 25 minutes with more than 50% of the time spent face-to-face answering questions about prognosis  and treatment and coordination of care.    Orders placed this encounter:  Orders Placed This Encounter  Procedures  . CBC with Differential/Platelet  . Comprehensive metabolic panel      [name], MD [place] [phone]

## 2018-08-26 NOTE — Progress Notes (Signed)
Patients port accessed for lab draw and oncology follow up visit.  Site with bruising, swelling, and dried blood on incision site.  Site assessed with two other nurses. Good blood return noted with lab draw and denied pain with flush.  Port site dressed per policy.

## 2018-08-26 NOTE — Progress Notes (Signed)
CRITICAL VALUE ALERT  Critical Value:  Hgb 6.7; WBC 1.3  Date & Time Notied:  4/202/2020 at 1010  Provider Notified: Dr. Delton Coombes  Orders Received/Actions taken: proceed with tx; transfuse 1 unit PRBC

## 2018-08-26 NOTE — Patient Instructions (Signed)
[facility] Discharge Instructions for Patients Receiving Chemotherapy  Today you received the following chemotherapy agents   To help prevent nausea and vomiting after your treatment, we encourage you to take your nausea medication   If you develop nausea and vomiting that is not controlled by your nausea medication, call the clinic.   BELOW ARE SYMPTOMS THAT SHOULD BE REPORTED IMMEDIATELY:  *FEVER GREATER THAN 100.5 F  *CHILLS WITH OR WITHOUT FEVER  NAUSEA AND VOMITING THAT IS NOT CONTROLLED WITH YOUR NAUSEA MEDICATION  *UNUSUAL SHORTNESS OF BREATH  *UNUSUAL BRUISING OR BLEEDING  TENDERNESS IN MOUTH AND THROAT WITH OR WITHOUT PRESENCE OF ULCERS  *URINARY PROBLEMS  *BOWEL PROBLEMS  UNUSUAL RASH Items with * indicate a potential emergency and should be followed up as soon as possible.  Feel free to call the clinic should you have any questions or concerns. The clinic phone number is [phone].  Please show the CHEMO ALERT CARD at check-in to the Emergency Department and triage nurse.   

## 2018-08-26 NOTE — Progress Notes (Signed)
Pt seen by Dr.Katragadda today. VSS. Labs reviewed. HGB today 6.7. MD aware. 1 unit of PRBC/s ordered. VO and message to proceed with treatment today and give blood products.   Treatment given today per MD orders. Tolerated infusion without adverse affects. Vital signs stable. No complaints at this time. Discharged from clinic ambulatory. F/U with [facility] as scheduled.

## 2018-08-26 NOTE — Patient Instructions (Signed)
[facility] at [facility] Discharge Instructions  You were seen today by Dr. Katragadda. He went over your recent lab results. He will see you back in 1 week for labs and follow up.   Thank you for choosing [facility] at [facility] to provide your oncology and hematology care.  To afford each patient quality time with our provider, please arrive at least 15 minutes before your scheduled appointment time.   If you have a lab appointment with the Cancer Center please come in thru the  Main Entrance and check in at the main information desk  You need to re-schedule your appointment should you arrive 10 or more minutes late.  We strive to give you quality time with our providers, and arriving late affects you and other patients whose appointments are after yours.  Also, if you no show three or more times for appointments you may be dismissed from the clinic at the providers discretion.     Again, thank you for choosing [facility].  Our hope is that these requests will decrease the amount of time that you wait before being seen by our physicians.       _____________________________________________________________  Should you have questions after your visit to [facility], please contact our office at [phone] between the hours of 8:00 a.m. and 4:30 p.m.  Voicemails left after 4:00 p.m. will not be returned until the following business day.  For prescription refill requests, have your pharmacy contact our office and allow 72 hours.    Cancer Center Support Programs:   > Cancer Support Group  2nd Tuesday of the month 1pm-2pm, Journey Room    

## 2018-08-26 NOTE — Assessment & Plan Note (Signed)
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  Nutritional work-up was negative. 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  Flow cytometry shows 12% blasts. 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  FISH panel is pending. 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 -I had a prolonged discussion with the patient and his wife about the normal prognosis of high-risk MDS.  His CBC today shows ANC of 600, platelets of 46 and hemoglobin which dropped to 8.7. - Vidaza at 50 mg/m started on 08/22/2018. - He did not experience any side effects over the weekend.  We reviewed his blood work.  Hemoglobin is 6.7. -We will give him 1 unit of blood transfusion. -He may proceed with his Vidaza today through Friday.  We will check his labs on a weekly basis.  We will transfuse him as needed.  2.  CKD: -He has CKD of several years duration.  Baseline creatinine is around 1.3-1.5.

## 2018-08-27 ENCOUNTER — Inpatient Hospital Stay (HOSPITAL_COMMUNITY): Payer: Medicare Other

## 2018-08-27 ENCOUNTER — Other Ambulatory Visit: Payer: Self-pay

## 2018-08-27 ENCOUNTER — Encounter (HOSPITAL_COMMUNITY): Payer: Self-pay

## 2018-08-27 VITALS — BP 136/68 | HR 93 | Temp 98.6°F | Resp 18

## 2018-08-27 DIAGNOSIS — Z95828 Presence of other vascular implants and grafts: Secondary | ICD-10-CM

## 2018-08-27 DIAGNOSIS — D46Z Other myelodysplastic syndromes: Secondary | ICD-10-CM

## 2018-08-27 DIAGNOSIS — D61818 Other pancytopenia: Secondary | ICD-10-CM | POA: Diagnosis not present

## 2018-08-27 LAB — TYPE AND SCREEN
ABO/RH(D): O POS
Antibody Screen: NEGATIVE
Unit division: 0

## 2018-08-27 LAB — BPAM RBC
Blood Product Expiration Date: 202005012359
ISSUE DATE / TIME: 202004201246
Unit Type and Rh: 5100

## 2018-08-27 MED ORDER — SODIUM CHLORIDE 0.9 % IV SOLN
Freq: Once | INTRAVENOUS | Status: AC
  Administered 2018-08-27: 10:00:00 via INTRAVENOUS

## 2018-08-27 MED ORDER — HEPARIN SOD (PORK) LOCK FLUSH 100 UNIT/ML IV SOLN
500.0000 [IU] | Freq: Once | INTRAVENOUS | Status: AC | PRN
  Administered 2018-08-27: 500 [IU]

## 2018-08-27 MED ORDER — SODIUM CHLORIDE 0.9% FLUSH
10.0000 mL | INTRAVENOUS | Status: DC | PRN
  Administered 2018-08-27: 10:00:00 10 mL
  Filled 2018-08-27: qty 10

## 2018-08-27 MED ORDER — SODIUM CHLORIDE 0.9 % IV SOLN
100.0000 mg | Freq: Once | INTRAVENOUS | Status: AC
  Administered 2018-08-27: 100 mg via INTRAVENOUS
  Filled 2018-08-27: qty 10

## 2018-08-27 NOTE — Progress Notes (Signed)
0945 Pt reported he experienced all over achiness last night after receiving Vidaza infusion as well as 1 unit of PRBC's yesterday. He denied any fever or chills and has taken Tylenol ES 2 tablets TID in the last 24 hrs.Vital signs stable today. Dr. Delton Coombes notified, no orders obtained at this time and pt approved for tx today per MD                                                                     Richard Wallace tolerated Vidaza infusion well without complaints or incident. VSS upon discharge. Pt discharged self ambulatory in satisfactory condition

## 2018-08-27 NOTE — Patient Instructions (Signed)
[facility] Discharge Instructions for Patients Receiving Chemotherapy   Beginning January 23rd 2017 lab work for the Cancer Center will be done in the  Main lab at [place] on 1st floor. If you have a lab appointment with the Cancer Center please come in thru the  Main Entrance and check in at the main information desk   Today you received the following chemotherapy agents Vidaza. Follow up as scheduled. Call clinic for any questions or concerns.   To help prevent nausea and vomiting after your treatment, we encourage you to take your nausea medication   If you develop nausea and vomiting, or diarrhea that is not controlled by your medication, call the clinic.  The clinic phone number is [phone]. Office hours are Monday-Friday 8:30am-5:00pm.  BELOW ARE SYMPTOMS THAT SHOULD BE REPORTED IMMEDIATELY:  *FEVER GREATER THAN 101.0 F  *CHILLS WITH OR WITHOUT FEVER  NAUSEA AND VOMITING THAT IS NOT CONTROLLED WITH YOUR NAUSEA MEDICATION  *UNUSUAL SHORTNESS OF BREATH  *UNUSUAL BRUISING OR BLEEDING  TENDERNESS IN MOUTH AND THROAT WITH OR WITHOUT PRESENCE OF ULCERS  *URINARY PROBLEMS  *BOWEL PROBLEMS  UNUSUAL RASH Items with * indicate a potential emergency and should be followed up as soon as possible. If you have an emergency after office hours please contact your primary care physician or go to the nearest emergency department.  Please call the clinic during office hours if you have any questions or concerns.   You may also contact the Patient Navigator at [phone] should you have any questions or need assistance in obtaining follow up care.      Resources For Cancer Patients and their Caregivers ? American Cancer Society: Can assist with transportation, wigs, general needs, runs Look Good Feel Better.        1-888-227-6333 ? Cancer Care: Provides financial assistance, online support groups, medication/co-pay assistance.  1-800-813-HOPE  (4673) ? [facility] Assists [place] cancer patients and their families through emotional , educational and financial support.  336-427-4357 ? [place] DSS Where to apply for food stamps, Medicaid and utility assistance. 336-342-1394 ? [employer]: Transportation to medical appointments. 336-347-2287 ? Social Security Administration: May apply for disability if have a Stage IV cancer. 336-342-7796 1-800-772-1213 ? [place] Aging, Disability and Transit Services: Assists with nutrition, care and transit needs. 336-349-2343          

## 2018-08-28 ENCOUNTER — Inpatient Hospital Stay (HOSPITAL_COMMUNITY): Payer: Medicare Other

## 2018-08-28 ENCOUNTER — Encounter (HOSPITAL_COMMUNITY): Payer: Self-pay

## 2018-08-28 ENCOUNTER — Other Ambulatory Visit (HOSPITAL_COMMUNITY): Payer: Medicare Other

## 2018-08-28 ENCOUNTER — Other Ambulatory Visit: Payer: Self-pay

## 2018-08-28 ENCOUNTER — Ambulatory Visit (HOSPITAL_COMMUNITY)
Admission: RE | Admit: 2018-08-28 | Discharge: 2018-08-28 | Disposition: A | Discharge status: Home / Self Care | Payer: Medicare Other | Source: Ambulatory Visit | Attending: Hematology | Admitting: Hematology

## 2018-08-28 ENCOUNTER — Ambulatory Visit (HOSPITAL_COMMUNITY): Payer: Medicare Other | Admitting: Hematology

## 2018-08-28 VITALS — BP 128/66 | HR 80 | Temp 98.2°F | Resp 18

## 2018-08-28 DIAGNOSIS — D46Z Other myelodysplastic syndromes: Secondary | ICD-10-CM

## 2018-08-28 DIAGNOSIS — R509 Fever, unspecified: Secondary | ICD-10-CM | POA: Insufficient documentation

## 2018-08-28 DIAGNOSIS — D61818 Other pancytopenia: Secondary | ICD-10-CM | POA: Diagnosis not present

## 2018-08-28 DIAGNOSIS — Z95828 Presence of other vascular implants and grafts: Secondary | ICD-10-CM

## 2018-08-28 MED ORDER — SODIUM CHLORIDE 0.9 % IV SOLN
100.0000 mg | Freq: Once | INTRAVENOUS | Status: AC
  Administered 2018-08-28: 100 mg via INTRAVENOUS
  Filled 2018-08-28: qty 10

## 2018-08-28 MED ORDER — HEPARIN SOD (PORK) LOCK FLUSH 100 UNIT/ML IV SOLN
500.0000 [IU] | Freq: Once | INTRAVENOUS | Status: AC | PRN
  Administered 2018-08-28: 500 [IU]

## 2018-08-28 MED ORDER — SODIUM CHLORIDE 0.9% FLUSH
10.0000 mL | INTRAVENOUS | Status: DC | PRN
  Administered 2018-08-28: 10 mL
  Filled 2018-08-28: qty 10

## 2018-08-28 MED ORDER — SODIUM CHLORIDE 0.9 % IV SOLN
Freq: Once | INTRAVENOUS | Status: AC
  Administered 2018-08-28: 12:00:00 via INTRAVENOUS

## 2018-08-28 MED ORDER — PALONOSETRON HCL INJECTION 0.25 MG/5ML
0.2500 mg | Freq: Once | INTRAVENOUS | Status: AC
  Administered 2018-08-28: 0.25 mg via INTRAVENOUS
  Filled 2018-08-28: qty 5

## 2018-08-28 NOTE — Patient Instructions (Signed)
[facility] Discharge Instructions for Patients Receiving Chemotherapy   Beginning January 23rd 2017 lab work for the Cancer Center will be done in the  Main lab at [place] on 1st floor. If you have a lab appointment with the Cancer Center please come in thru the  Main Entrance and check in at the main information desk   Today you received the following chemotherapy agents Vidaza. Follow up as scheduled. Call clinic for any questions or concerns.   To help prevent nausea and vomiting after your treatment, we encourage you to take your nausea medication   If you develop nausea and vomiting, or diarrhea that is not controlled by your medication, call the clinic.  The clinic phone number is [phone]. Office hours are Monday-Friday 8:30am-5:00pm.  BELOW ARE SYMPTOMS THAT SHOULD BE REPORTED IMMEDIATELY:  *FEVER GREATER THAN 101.0 F  *CHILLS WITH OR WITHOUT FEVER  NAUSEA AND VOMITING THAT IS NOT CONTROLLED WITH YOUR NAUSEA MEDICATION  *UNUSUAL SHORTNESS OF BREATH  *UNUSUAL BRUISING OR BLEEDING  TENDERNESS IN MOUTH AND THROAT WITH OR WITHOUT PRESENCE OF ULCERS  *URINARY PROBLEMS  *BOWEL PROBLEMS  UNUSUAL RASH Items with * indicate a potential emergency and should be followed up as soon as possible. If you have an emergency after office hours please contact your primary care physician or go to the nearest emergency department.  Please call the clinic during office hours if you have any questions or concerns.   You may also contact the Patient Navigator at [phone] should you have any questions or need assistance in obtaining follow up care.      Resources For Cancer Patients and their Caregivers ? American Cancer Society: Can assist with transportation, wigs, general needs, runs Look Good Feel Better.        1-888-227-6333 ? Cancer Care: Provides financial assistance, online support groups, medication/co-pay assistance.  1-800-813-HOPE  (4673) ? [facility] Assists [place] cancer patients and their families through emotional , educational and financial support.  336-427-4357 ? [place] DSS Where to apply for food stamps, Medicaid and utility assistance. 336-342-1394 ? [employer]: Transportation to medical appointments. 336-347-2287 ? Social Security Administration: May apply for disability if have a Stage IV cancer. 336-342-7796 1-800-772-1213 ? [place] Aging, Disability and Transit Services: Assists with nutrition, care and transit needs. 336-349-2343          

## 2018-08-28 NOTE — Progress Notes (Signed)
6002 Pt reported a fever of 101 last evening around 5 pm as well as feeling achy all over. He takes 2 ES Tylenol TID on a routine basis due to arthritic pain so by bedtime last night it was back to normal. Pt denies any dyspnea,cough or increased pain around portacath site and no urinary issues.No swelling or redness noted around portacath site.Reviewed this information with Dr. Delton Coombes and orders obtained for blood cultures x 2,urine culture and CXR today as well as approval given for pt to receive his Vidaza infusion.        Richard Wallace tolerated Vidaza infusion well without complaints or incident. Port left accessed and flushed for use tomorrow. CXR results reviewed by Dr. Delton Coombes and pt ok for discharge. VSS upon discharge. Pt discharged self ambulatory in satisfactory condition

## 2018-08-29 ENCOUNTER — Inpatient Hospital Stay (HOSPITAL_COMMUNITY): Payer: Medicare Other

## 2018-08-29 ENCOUNTER — Encounter (HOSPITAL_COMMUNITY): Payer: Self-pay

## 2018-08-29 VITALS — BP 110/54 | HR 80 | Temp 98.3°F | Resp 18

## 2018-08-29 DIAGNOSIS — D61818 Other pancytopenia: Secondary | ICD-10-CM

## 2018-08-29 DIAGNOSIS — D46Z Other myelodysplastic syndromes: Secondary | ICD-10-CM

## 2018-08-29 DIAGNOSIS — Z95828 Presence of other vascular implants and grafts: Secondary | ICD-10-CM

## 2018-08-29 LAB — CBC WITH DIFFERENTIAL/PLATELET
Abs Immature Granulocytes: 0.01 10*3/uL (ref 0.00–0.07)
Basophils Absolute: 0 10*3/uL (ref 0.0–0.1)
Basophils Relative: 0 %
Eosinophils Absolute: 0 10*3/uL (ref 0.0–0.5)
Eosinophils Relative: 0 %
HCT: 24 % — ABNORMAL LOW (ref 39.0–52.0)
Hemoglobin: 7.6 g/dL — ABNORMAL LOW (ref 13.0–17.0)
Immature Granulocytes: 0 %
Lymphocytes Relative: 35 %
Lymphs Abs: 0.9 10*3/uL (ref 0.7–4.0)
MCH: 33.9 pg (ref 26.0–34.0)
MCHC: 31.7 g/dL (ref 30.0–36.0)
MCV: 107.1 fL — ABNORMAL HIGH (ref 80.0–100.0)
Monocytes Absolute: 0.1 10*3/uL (ref 0.1–1.0)
Monocytes Relative: 3 %
Neutro Abs: 1.5 10*3/uL — ABNORMAL LOW (ref 1.7–7.7)
Neutrophils Relative %: 62 %
Platelets: 47 10*3/uL — ABNORMAL LOW (ref 150–400)
RBC: 2.24 MIL/uL — ABNORMAL LOW (ref 4.22–5.81)
RDW: 21.7 % — ABNORMAL HIGH (ref 11.5–15.5)
WBC: 2.4 10*3/uL — ABNORMAL LOW (ref 4.0–10.5)
nRBC: 1.6 % — ABNORMAL HIGH (ref 0.0–0.2)

## 2018-08-29 LAB — COMPREHENSIVE METABOLIC PANEL
ALT: 24 U/L (ref 0–44)
AST: 24 U/L (ref 15–41)
Albumin: 3.6 g/dL (ref 3.5–5.0)
Alkaline Phosphatase: 55 U/L (ref 38–126)
Anion gap: 9 (ref 5–15)
BUN: 26 mg/dL — ABNORMAL HIGH (ref 8–23)
CO2: 23 mmol/L (ref 22–32)
Calcium: 9.2 mg/dL (ref 8.9–10.3)
Chloride: 108 mmol/L (ref 98–111)
Creatinine, Ser: 1.43 mg/dL — ABNORMAL HIGH (ref 0.61–1.24)
GFR calc Af Amer: 56 mL/min — ABNORMAL LOW (ref >60)
GFR calc non Af Amer: 48 mL/min — ABNORMAL LOW (ref >60)
Glucose, Bld: 123 mg/dL — ABNORMAL HIGH (ref 70–99)
Potassium: 5.1 mmol/L (ref 3.5–5.1)
Sodium: 140 mmol/L (ref 135–145)
Total Bilirubin: 0.9 mg/dL (ref 0.3–1.2)
Total Protein: 6.2 g/dL — ABNORMAL LOW (ref 6.5–8.1)

## 2018-08-29 LAB — URINE CULTURE: Culture: NO GROWTH

## 2018-08-29 MED ORDER — HEPARIN SOD (PORK) LOCK FLUSH 100 UNIT/ML IV SOLN
500.0000 [IU] | Freq: Once | INTRAVENOUS | Status: AC | PRN
  Administered 2018-08-29: 500 [IU]

## 2018-08-29 MED ORDER — SODIUM CHLORIDE 0.9 % IV SOLN
Freq: Once | INTRAVENOUS | Status: AC
  Administered 2018-08-29: 09:00:00 via INTRAVENOUS

## 2018-08-29 MED ORDER — SODIUM CHLORIDE 0.9 % IV SOLN
100.0000 mg | Freq: Once | INTRAVENOUS | Status: AC
  Administered 2018-08-29: 100 mg via INTRAVENOUS
  Filled 2018-08-29: qty 10

## 2018-08-29 MED ORDER — SODIUM CHLORIDE 0.9% FLUSH
10.0000 mL | INTRAVENOUS | Status: DC | PRN
  Administered 2018-08-29: 10 mL
  Filled 2018-08-29: qty 10

## 2018-08-29 NOTE — Patient Instructions (Signed)
[facility] Discharge Instructions for Patients Receiving Chemotherapy   Beginning January 23rd 2017 lab work for the Cancer Center will be done in the  Main lab at [place] on 1st floor. If you have a lab appointment with the Cancer Center please come in thru the  Main Entrance and check in at the main information desk   Today you received the following chemotherapy agents Vidaza. Follow up as scheduled. Call clinic for any questions or concerns.   To help prevent nausea and vomiting after your treatment, we encourage you to take your nausea medication   If you develop nausea and vomiting, or diarrhea that is not controlled by your medication, call the clinic.  The clinic phone number is [phone]. Office hours are Monday-Friday 8:30am-5:00pm.  BELOW ARE SYMPTOMS THAT SHOULD BE REPORTED IMMEDIATELY:  *FEVER GREATER THAN 101.0 F  *CHILLS WITH OR WITHOUT FEVER  NAUSEA AND VOMITING THAT IS NOT CONTROLLED WITH YOUR NAUSEA MEDICATION  *UNUSUAL SHORTNESS OF BREATH  *UNUSUAL BRUISING OR BLEEDING  TENDERNESS IN MOUTH AND THROAT WITH OR WITHOUT PRESENCE OF ULCERS  *URINARY PROBLEMS  *BOWEL PROBLEMS  UNUSUAL RASH Items with * indicate a potential emergency and should be followed up as soon as possible. If you have an emergency after office hours please contact your primary care physician or go to the nearest emergency department.  Please call the clinic during office hours if you have any questions or concerns.   You may also contact the Patient Navigator at [phone] should you have any questions or need assistance in obtaining follow up care.      Resources For Cancer Patients and their Caregivers ? American Cancer Society: Can assist with transportation, wigs, general needs, runs Look Good Feel Better.        1-888-227-6333 ? Cancer Care: Provides financial assistance, online support groups, medication/co-pay assistance.  1-800-813-HOPE  (4673) ? [facility] Assists [place] cancer patients and their families through emotional , educational and financial support.  336-427-4357 ? [place] DSS Where to apply for food stamps, Medicaid and utility assistance. 336-342-1394 ? [employer]: Transportation to medical appointments. 336-347-2287 ? Social Security Administration: May apply for disability if have a Stage IV cancer. 336-342-7796 1-800-772-1213 ? [place] Aging, Disability and Transit Services: Assists with nutrition, care and transit needs. 336-349-2343          

## 2018-08-29 NOTE — Patient Instructions (Signed)
[facility] at [facility] Discharge Instructions  Labs drawn from portacath today   Thank you for choosing [facility] at [facility] to provide your oncology and hematology care.  To afford each patient quality time with our provider, please arrive at least 15 minutes before your scheduled appointment time.   If you have a lab appointment with the Cancer Center please come in thru the  Main Entrance and check in at the main information desk  You need to re-schedule your appointment should you arrive 10 or more minutes late.  We strive to give you quality time with our providers, and arriving late affects you and other patients whose appointments are after yours.  Also, if you no show three or more times for appointments you may be dismissed from the clinic at the providers discretion.     Again, thank you for choosing [facility].  Our hope is that these requests will decrease the amount of time that you wait before being seen by our physicians.       _____________________________________________________________  Should you have questions after your visit to [facility], please contact our office at [phone] between the hours of 8:00 a.m. and 4:30 p.m.  Voicemails left after 4:00 p.m. will not be returned until the following business day.  For prescription refill requests, have your pharmacy contact our office and allow 72 hours.    Cancer Center Support Programs:   > Cancer Support Group  2nd Tuesday of the month 1pm-2pm, Journey Room   

## 2018-08-29 NOTE — Progress Notes (Signed)
1580 CBCD and CMET results reviewed with Dr. Delton Coombes and pt approved for Vidaza infusion today per MD                                                                               [name] tolerated Vidaza infusion well without complaints or incident. VSS upon discharge. Pt discharged self ambulatory in satisfactory condition

## 2018-08-30 ENCOUNTER — Inpatient Hospital Stay (HOSPITAL_COMMUNITY): Payer: Medicare Other

## 2018-08-30 ENCOUNTER — Other Ambulatory Visit: Payer: Self-pay

## 2018-08-30 VITALS — BP 118/50 | HR 80 | Temp 97.8°F | Resp 18 | Wt 231.8 lb

## 2018-08-30 DIAGNOSIS — D61818 Other pancytopenia: Secondary | ICD-10-CM | POA: Diagnosis not present

## 2018-08-30 DIAGNOSIS — Z95828 Presence of other vascular implants and grafts: Secondary | ICD-10-CM

## 2018-08-30 DIAGNOSIS — D46Z Other myelodysplastic syndromes: Secondary | ICD-10-CM

## 2018-08-30 MED ORDER — PALONOSETRON HCL INJECTION 0.25 MG/5ML
0.2500 mg | Freq: Once | INTRAVENOUS | Status: AC
  Administered 2018-08-30: 0.25 mg via INTRAVENOUS
  Filled 2018-08-30: qty 5

## 2018-08-30 MED ORDER — SODIUM CHLORIDE 0.9% FLUSH
10.0000 mL | INTRAVENOUS | Status: DC | PRN
  Administered 2018-08-30 (×2): 10 mL
  Filled 2018-08-30 (×2): qty 10

## 2018-08-30 MED ORDER — HEPARIN SOD (PORK) LOCK FLUSH 100 UNIT/ML IV SOLN
500.0000 [IU] | Freq: Once | INTRAVENOUS | Status: AC | PRN
  Administered 2018-08-30: 500 [IU]

## 2018-08-30 MED ORDER — SODIUM CHLORIDE 0.9 % IV SOLN
Freq: Once | INTRAVENOUS | Status: AC
  Administered 2018-08-30: 09:00:00 via INTRAVENOUS

## 2018-08-30 MED ORDER — SODIUM CHLORIDE 0.9 % IV SOLN
100.0000 mg | Freq: Once | INTRAVENOUS | Status: AC
  Administered 2018-08-30: 100 mg via INTRAVENOUS
  Filled 2018-08-30: qty 10

## 2018-08-30 NOTE — Progress Notes (Signed)
Pt presents today for Vidaza Day 7. VSS and within parameters for treatment. No complaints of any changes since the last visit.     Called and spoke to pt's wife per her request pertaining to giving pt Fleet's enema's. Per pt, " I had a bowel movement but my wife who's a nurse says it's not." Information sheet given to patient and  pt has no complaints of any abdominal pain. Pt states he had a BM.   Treatment given today per MD orders. Tolerated infusion without adverse affects. Vital signs stable. No complaints at this time. Discharged from clinic ambulatory. F/U with [facility] as scheduled.

## 2018-08-30 NOTE — Patient Instructions (Signed)
[facility] Discharge Instructions for Patients Receiving Chemotherapy  Today you received the following chemotherapy agents   To help prevent nausea and vomiting after your treatment, we encourage you to take your nausea medication   If you develop nausea and vomiting that is not controlled by your nausea medication, call the clinic.   BELOW ARE SYMPTOMS THAT SHOULD BE REPORTED IMMEDIATELY:  *FEVER GREATER THAN 100.5 F  *CHILLS WITH OR WITHOUT FEVER  NAUSEA AND VOMITING THAT IS NOT CONTROLLED WITH YOUR NAUSEA MEDICATION  *UNUSUAL SHORTNESS OF BREATH  *UNUSUAL BRUISING OR BLEEDING  TENDERNESS IN MOUTH AND THROAT WITH OR WITHOUT PRESENCE OF ULCERS  *URINARY PROBLEMS  *BOWEL PROBLEMS  UNUSUAL RASH Items with * indicate a potential emergency and should be followed up as soon as possible.  Feel free to call the clinic should you have any questions or concerns. The clinic phone number is [phone].  Please show the CHEMO ALERT CARD at check-in to the Emergency Department and triage nurse.   

## 2018-09-02 ENCOUNTER — Other Ambulatory Visit: Payer: Self-pay

## 2018-09-02 ENCOUNTER — Ambulatory Visit (HOSPITAL_COMMUNITY): Payer: Medicare Other

## 2018-09-02 ENCOUNTER — Encounter (HOSPITAL_COMMUNITY): Payer: Self-pay | Admitting: Hematology

## 2018-09-02 ENCOUNTER — Inpatient Hospital Stay (HOSPITAL_BASED_OUTPATIENT_CLINIC_OR_DEPARTMENT_OTHER): Payer: Medicare Other | Admitting: Hematology

## 2018-09-02 ENCOUNTER — Inpatient Hospital Stay (HOSPITAL_COMMUNITY): Payer: Medicare Other

## 2018-09-02 DIAGNOSIS — D61818 Other pancytopenia: Secondary | ICD-10-CM | POA: Diagnosis not present

## 2018-09-02 DIAGNOSIS — D46Z Other myelodysplastic syndromes: Secondary | ICD-10-CM

## 2018-09-02 LAB — COMPREHENSIVE METABOLIC PANEL
ALT: 25 U/L (ref 0–44)
AST: 27 U/L (ref 15–41)
Albumin: 3.6 g/dL (ref 3.5–5.0)
Alkaline Phosphatase: 57 U/L (ref 38–126)
Anion gap: 8 (ref 5–15)
BUN: 24 mg/dL — ABNORMAL HIGH (ref 8–23)
CO2: 25 mmol/L (ref 22–32)
Calcium: 9.1 mg/dL (ref 8.9–10.3)
Chloride: 109 mmol/L (ref 98–111)
Creatinine, Ser: 1.44 mg/dL — ABNORMAL HIGH (ref 0.61–1.24)
GFR calc Af Amer: 55 mL/min — ABNORMAL LOW (ref >60)
GFR calc non Af Amer: 48 mL/min — ABNORMAL LOW (ref >60)
Glucose, Bld: 122 mg/dL — ABNORMAL HIGH (ref 70–99)
Potassium: 4.9 mmol/L (ref 3.5–5.1)
Sodium: 142 mmol/L (ref 135–145)
Total Bilirubin: 0.7 mg/dL (ref 0.3–1.2)
Total Protein: 6.2 g/dL — ABNORMAL LOW (ref 6.5–8.1)

## 2018-09-02 LAB — CBC WITH DIFFERENTIAL/PLATELET
Abs Immature Granulocytes: 0.02 10*3/uL (ref 0.00–0.07)
Basophils Absolute: 0 10*3/uL (ref 0.0–0.1)
Basophils Relative: 0 %
Eosinophils Absolute: 0 10*3/uL (ref 0.0–0.5)
Eosinophils Relative: 0 %
HCT: 23.9 % — ABNORMAL LOW (ref 39.0–52.0)
Hemoglobin: 7.2 g/dL — ABNORMAL LOW (ref 13.0–17.0)
Immature Granulocytes: 1 %
Lymphocytes Relative: 44 %
Lymphs Abs: 1.1 10*3/uL (ref 0.7–4.0)
MCH: 33.6 pg (ref 26.0–34.0)
MCHC: 30.1 g/dL (ref 30.0–36.0)
MCV: 111.7 fL — ABNORMAL HIGH (ref 80.0–100.0)
Monocytes Absolute: 0.3 10*3/uL (ref 0.1–1.0)
Monocytes Relative: 13 %
Neutro Abs: 1 10*3/uL — ABNORMAL LOW (ref 1.7–7.7)
Neutrophils Relative %: 42 %
Platelets: 48 10*3/uL — ABNORMAL LOW (ref 150–400)
RBC: 2.14 MIL/uL — ABNORMAL LOW (ref 4.22–5.81)
RDW: 21.7 % — ABNORMAL HIGH (ref 11.5–15.5)
WBC: 2.5 10*3/uL — ABNORMAL LOW (ref 4.0–10.5)
nRBC: 2.8 % — ABNORMAL HIGH (ref 0.0–0.2)

## 2018-09-02 LAB — CULTURE, BLOOD (ROUTINE X 2)
Culture: NO GROWTH
Culture: NO GROWTH
Special Requests: ADEQUATE

## 2018-09-02 NOTE — Patient Instructions (Signed)
[facility] at [facility] Discharge Instructions  You were seen today by Dr. Katragadda. He went over your recent lab results. He will see you back in 1 week for labs and follow up.   Thank you for choosing [facility] at [facility] to provide your oncology and hematology care.  To afford each patient quality time with our provider, please arrive at least 15 minutes before your scheduled appointment time.   If you have a lab appointment with the Cancer Center please come in thru the  Main Entrance and check in at the main information desk  You need to re-schedule your appointment should you arrive 10 or more minutes late.  We strive to give you quality time with our providers, and arriving late affects you and other patients whose appointments are after yours.  Also, if you no show three or more times for appointments you may be dismissed from the clinic at the providers discretion.     Again, thank you for choosing [facility].  Our hope is that these requests will decrease the amount of time that you wait before being seen by our physicians.       _____________________________________________________________  Should you have questions after your visit to [facility], please contact our office at [phone] between the hours of 8:00 a.m. and 4:30 p.m.  Voicemails left after 4:00 p.m. will not be returned until the following business day.  For prescription refill requests, have your pharmacy contact our office and allow 72 hours.    Cancer Center Support Programs:   > Cancer Support Group  2nd Tuesday of the month 1pm-2pm, Journey Room    

## 2018-09-02 NOTE — Progress Notes (Signed)
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[place])    08/22/2018 -  Chemotherapy    The patient had palonosetron (ALOXI) injection 0.25 mg, 0.25 mg, Intravenous,  Once, 1 of 4 cycles Administration: 0.25 mg (08/22/2018), 0.25 mg (08/26/2018), 0.25 mg (08/28/2018), 0.25 mg (08/30/2018) azaCITIDine (VIDAZA) 100 mg in sodium chloride 0.9 % 50 mL chemo infusion, 110 mg (66.7 % of original dose 75 mg/m2), Intravenous, Once, 1 of 4 cycles Dose modification: 50 mg/m2 (original dose 75 mg/m2, Cycle 1, Reason: Provider Judgment) Administration: 100 mg (08/22/2018), 100 mg (08/23/2018), 100 mg (08/26/2018), 100 mg (08/27/2018), 100 mg (08/28/2018), 100 mg (08/29/2018), 100 mg (08/30/2018)  for chemotherapy treatment.       CANCER STAGING: Cancer Staging No matching staging information was found for the patient.   INTERVAL HISTORY:  [name] 73 y.o. male returns for routine follow-up. He is here today alone. He states that he has felt fine since his last visit. He states that his port site is still really bruised and tender as well as some drainage that is pink in color. Denies any nausea, vomiting, or diarrhea. Denies any new pains. Had not noticed any recent bleeding such as epistaxis, hematuria or hematochezia. Denies recent chest pain on exertion, shortness of breath on minimal exertion, pre-syncopal episodes, or palpitations. Denies any numbness or tingling in hands or feet. Denies any recent fevers, infections, or recent hospitalizations. Patient reports appetite at 100% and energy level at 25%.    REVIEW OF SYSTEMS:  Review of Systems  Constitutional: Positive  for fatigue.  Neurological: Positive for dizziness.     PAST MEDICAL/SURGICAL HISTORY:  Past Medical History:  Diagnosis Date  . Arthritis   . 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 Cancer ([place]) 01-28-2014   skin cancer  . CKD (chronic kidney disease), stage III ([place])   . GERD (gastroesophageal reflux disease)   . Heart murmur    NOTED DURING PHYSICAL WHEN HE WAS ENLISTING IN [facility] , DIDNT KNOW UNTIL THAT TIME AND REPORTS , "THATS THE LAST I HEARD ABOUT IT "   . History of hypertension    no longer issue  . History of kidney stones   . History of malignant melanoma of skin    excision top of scalp 2015-- no recurrence  . History of urinary retention    post op lumbar fusion surgery 04/ 2016  . Hypertension   . 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 Left ureteral calculus   . Seasonal allergies   . Wears glasses   . Wears glasses   . Wears partial dentures    upper and lower   Past Surgical History:  Procedure Laterality Date  . ANKLE FUSION Right 2007  . CARPAL TUNNEL RELEASE Left 12/28/2009   w/ pulley release left long finger  . CARPAL TUNNEL RELEASE Right 07/22/2013   Procedure: RIGHT CARPAL TUNNEL RELEASE;  Surgeon: Cammie Sickle., MD;  Location: [facility];  Service: Orthopedics;  Laterality: Right;  . COLONOSCOPY    .  CYSTO/  LEFT RETROGRADE PYELOGRAM  11/21/2010  . CYSTOSCOPY WITH STENT PLACEMENT Left 03/09/2016   Procedure: CYSTOSCOPY WITH STENT PLACEMENT;  Surgeon: Franchot Gallo, MD;  Location: [facility];  Service: Urology;  Laterality: Left;  . CYSTOSCOPY/RETROGRADE/URETEROSCOPY/STONE EXTRACTION WITH BASKET Left 03/09/2016   Procedure: CYSTOSCOPY/RETROGRADE/URETEROSCOPY/STONE EXTRACTION WITH BASKET;  Surgeon: Franchot Gallo, MD;  Location: [facility];  Service: Urology;  Laterality: Left;  . LEFT URETEROSCOPIC LASER  LITHOTRIPSY STONE EXTRACTION/ STENT PLACEMENT  05/23/2010  . MOHS SURGERY     TOP OF THE HEAD   . ORIF ANKLE FRACTURE Right 1978  . PORTACATH PLACEMENT Right 08/19/2018   Procedure: INSERTION PORT-A-CATH (attached catheter in right subclavian);  Surgeon: Aviva Signs, MD;  Location: [facility];  Service: General;  Laterality: Right;  . POSTERIOR LUMBAR FUSION  08/21/2014   laminectomy and decompression L2 -- L5  . RIGHT LOWER LEG SURGERY  X3  1975 to 1976   including ORIF  . TONSILLECTOMY AND ADENOIDECTOMY  1986  . UMBILICAL HERNIA REPAIR  2009 approx     SOCIAL HISTORY:  Social History   Socioeconomic History  . Marital status: Married    Spouse name: Not on file  . Number of children: Not on file  . Years of education: Not on file  . Highest education level: Not on file  Occupational History  . Not on file  Social Needs  . Financial resource strain: Not on file  . Food insecurity:    Worry: Not on file    Inability: Not on file  . Transportation needs:    Medical: Not on file    Non-medical: Not on file  Tobacco Use  . Smoking status: Former Smoker    Years: 20.00    Types: Cigarettes    Last attempt to quit: 07/17/1986    Years since quitting: 32.1  . Smokeless tobacco: Never Used  Substance and Sexual Activity  . Alcohol use: Yes    Alcohol/week: 7.0 - 14.0 standard drinks    Types: 7 - 14 Cans of beer per week    Comment: 1 -2 beer daily  . Drug use: No  . Sexual activity: Not on file  Lifestyle  . Physical activity:    Days per week: Not on file    Minutes per session: Not on file  . Stress: Not on file  Relationships  . Social connections:    Talks on phone: Not on file    Gets together: Not on file    Attends religious service: Not on file    Active member of club or organization: Not on file    Attends meetings of clubs or organizations: Not on file    Relationship status: Not on file  . Intimate partner violence:    Fear of current or ex partner: Not  on file    Emotionally abused: Not on file    Physically abused: Not on file    Forced sexual activity: Not on file  Other Topics Concern  . Not on file  Social History Narrative  . Not on file    FAMILY HISTORY:  Family History  Problem Relation Age of Onset  . Stroke Mother   . Prostate cancer Father   . Bone cancer Father   . Rheum arthritis Sister   . Urinary tract infection Sister     CURRENT MEDICATIONS:  Outpatient Encounter Medications as of 09/02/2018  Medication Sig  . acetaminophen (TYLENOL) 500 MG tablet Take 1,000  mg by mouth every 6 (six) hours as needed for mild pain or moderate pain.   Marland Kitchen allopurinol (ZYLOPRIM) 300 MG tablet Take 300 mg by mouth every evening.  Marland Kitchen azaCITIDine 5 mg/2 mLs in lactated ringers infusion Inject into the vein daily. Day 1-7 every 28 days (starting 08/22/2018)  . celecoxib (CELEBREX) 200 MG capsule Take 200 mg by mouth daily. IN THE MORNING  . cetirizine (ZYRTEC) 10 MG tablet Take 10 mg by mouth daily. IN THE MORNING  . diclofenac sodium (VOLTAREN) 1 % GEL Apply 1 g topically 3 (three) times daily as needed (knee pain.).   Marland Kitchen docusate sodium (COLACE) 100 MG capsule Take 100 mg by mouth at bedtime.   . fluticasone (FLONASE) 50 MCG/ACT nasal spray Place 1 spray into both nostrils daily. In the morning  . HYDROcodone-acetaminophen (NORCO) 5-325 MG tablet Take 1 tablet by mouth every 4 (four) hours as needed for moderate pain.  Marland Kitchen lansoprazole (PREVACID) 15 MG capsule Take 15 mg by mouth daily before breakfast.   . lidocaine-prilocaine (EMLA) cream Apply pea-sized amount to port a cath site and cover with plastic wrap one hour prior to chemotherapy appointment  . lisinopril (PRINIVIL,ZESTRIL) 5 MG tablet Take 2.5 mg by mouth daily. In the morning  . magnesium oxide (MAG-OX) 400 MG tablet Take 400 mg by mouth at bedtime.   Vladimir Faster Glycol-Propyl Glycol (SYSTANE OP) Place 1 drop into both eyes 2 (two) times daily.  . prochlorperazine (COMPAZINE)  10 MG tablet Take 1 tablet (10 mg total) by mouth every 6 (six) hours as needed (Nausea or vomiting).  . tamsulosin (FLOMAX) 0.4 MG CAPS capsule Take 0.4 mg by mouth every evening.   . traMADol (ULTRAM) 50 MG tablet Take 50 mg by mouth 3 (three) times daily as needed (pain.).    No facility-administered encounter medications on file as of 09/02/2018.     ALLERGIES:  No Known Allergies   PHYSICAL EXAM:  ECOG Performance status: 1  Vitals:   09/02/18 1302  BP: 128/64  Pulse: 84  Resp: 18  Temp: 98.1 F (36.7 C)  SpO2: 98%   Filed Weights   09/02/18 1302  Weight: 232 lb 8 oz (105.5 kg)    Physical Exam Vitals signs reviewed.  Constitutional:      Appearance: Normal appearance.  Cardiovascular:     Rate and Rhythm: Normal rate and regular rhythm.     Pulses: Normal pulses.     Heart sounds: Normal heart sounds.  Pulmonary:     Breath sounds: Normal breath sounds.  Abdominal:     General: There is no distension.     Palpations: Abdomen is soft. There is no mass.  Musculoskeletal:        General: No swelling.  Skin:    General: Skin is warm.  Neurological:     General: No focal deficit present.     Mental Status: He is alert and oriented to person, place, and time.  Psychiatric:        Mood and Affect: Mood normal.        Behavior: Behavior normal.    Port site has slight swelling with some bruising.  LABORATORY DATA:  I have reviewed the labs as listed.  CBC    Component Value Date/Time   WBC 2.5 (L) 09/02/2018 1151   RBC 2.14 (L) 09/02/2018 1151   HGB 7.2 (L) 09/02/2018 1151   HCT 23.9 (L) 09/02/2018 1151   PLT 48 (L) 09/02/2018 1151  MCV 111.7 (H) 09/02/2018 1151   MCH 33.6 09/02/2018 1151   MCHC 30.1 09/02/2018 1151   RDW 21.7 (H) 09/02/2018 1151   LYMPHSABS 1.1 09/02/2018 1151   MONOABS 0.3 09/02/2018 1151   EOSABS 0.0 09/02/2018 1151   BASOSABS 0.0 09/02/2018 1151   CMP Latest Ref Rng & Units 09/02/2018 08/29/2018 08/26/2018  Glucose 70 - 99  mg/dL 122(H) 123(H) 123(H)  BUN 8 - 23 mg/dL 24(H) 26(H) 32(H)  Creatinine 0.61 - 1.24 mg/dL 1.44(H) 1.43(H) 1.39(H)  Sodium 135 - 145 mmol/L 142 140 139  Potassium 3.5 - 5.1 mmol/L 4.9 5.1 4.8  Chloride 98 - 111 mmol/L 109 108 107  CO2 22 - 32 mmol/L _0 Calcium 8.9 - 10.3 mg/dL 9.1 9.2 9.2  Total Protein 6.5 - 8.1 g/dL 6.2(L) 6.2(L) 6.3(L)  Total Bilirubin 0.3 - 1.2 mg/dL 0.7 0.9 0.8  Alkaline Phos 38 - 126 U/L 57 55 53  AST 15 - 41 U/L _1 ALT 0 - 44 U/L _2 DIAGNOSTIC IMAGING:  I have independently reviewed the scans and discussed with the patient.   I have reviewed Venita Lick LPN's note and agree with the documentation.  I personally performed a face-to-face visit, made revisions and my assessment and plan is as follows.    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  Nutritional work-up was negative. 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  Flow cytometry shows 12% blasts. 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  FISH panel is pending. 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 -We discussed the normal prognosis of high-risk MDS.  - Vidaza at 50 mg per metered square started on 08/22/2018. -He had 1 day of fever last week.  We did blood cultures which did not grow any. -We reviewed his blood work.  Hemoglobin is 7.2.  ANC is 1000.  Platelet count is 48. -There is some edema at the port site.  We have contacted Dr. Arnoldo Morale who advised to use ice over the port site. - We will reevaluate him next week.  Today he does not require any blood transfusions.  2.  CKD: -He has CKD of several years duration.  Baseline creatinine is between 1.3-1.5.      Total time spent is 25 minutes with more than 50% of time spent face-to-face discussing and reinforcing treatment plan and coordination of care.  Orders placed this encounter:  No orders of the defined types were placed in this encounter.     [name], MD [place] [phone]

## 2018-09-02 NOTE — Assessment & Plan Note (Signed)
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  Nutritional work-up was negative. 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  Flow cytometry shows 12% blasts. 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  FISH panel is pending. 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 -We discussed the normal prognosis of high-risk MDS.  - Vidaza at 50 mg per metered square started on 08/22/2018. -He had 1 day of fever last week.  We did blood cultures which did not grow any. -We reviewed his blood work.  Hemoglobin is 7.2.  ANC is 1000.  Platelet count is 48. -There is some edema at the port site.  We have contacted Dr. Arnoldo Morale who advised to use ice over the port site. - We will reevaluate him next week.  Today he does not require any blood transfusions.  2.  CKD: -He has CKD of several years duration.  Baseline creatinine is between 1.3-1.5.

## 2018-09-03 ENCOUNTER — Ambulatory Visit (HOSPITAL_COMMUNITY): Payer: Medicare Other

## 2018-09-03 LAB — MISC LABCORP TEST (SEND OUT): Labcorp test code: 451953

## 2018-09-09 ENCOUNTER — Other Ambulatory Visit: Payer: Self-pay

## 2018-09-09 ENCOUNTER — Inpatient Hospital Stay (HOSPITAL_COMMUNITY): Payer: Medicare Other

## 2018-09-09 ENCOUNTER — Inpatient Hospital Stay (HOSPITAL_COMMUNITY): Payer: Medicare Other | Attending: Hematology | Admitting: Hematology

## 2018-09-09 ENCOUNTER — Encounter (HOSPITAL_COMMUNITY): Payer: Self-pay | Admitting: Hematology

## 2018-09-09 DIAGNOSIS — I129 Hypertensive chronic kidney disease with stage 1 through stage 4 chronic kidney disease, or unspecified chronic kidney disease: Secondary | ICD-10-CM

## 2018-09-09 DIAGNOSIS — Z87442 Personal history of urinary calculi: Secondary | ICD-10-CM | POA: Insufficient documentation

## 2018-09-09 DIAGNOSIS — D61818 Other pancytopenia: Secondary | ICD-10-CM | POA: Diagnosis not present

## 2018-09-09 DIAGNOSIS — N183 Chronic kidney disease, stage 3 (moderate): Secondary | ICD-10-CM

## 2018-09-09 DIAGNOSIS — Z85820 Personal history of malignant melanoma of skin: Secondary | ICD-10-CM | POA: Insufficient documentation

## 2018-09-09 DIAGNOSIS — Z79899 Other long term (current) drug therapy: Secondary | ICD-10-CM

## 2018-09-09 DIAGNOSIS — Z87891 Personal history of nicotine dependence: Secondary | ICD-10-CM | POA: Insufficient documentation

## 2018-09-09 DIAGNOSIS — H1132 Conjunctival hemorrhage, left eye: Secondary | ICD-10-CM | POA: Diagnosis not present

## 2018-09-09 DIAGNOSIS — D469 Myelodysplastic syndrome, unspecified: Secondary | ICD-10-CM | POA: Diagnosis present

## 2018-09-09 DIAGNOSIS — D46Z Other myelodysplastic syndromes: Secondary | ICD-10-CM

## 2018-09-09 DIAGNOSIS — K219 Gastro-esophageal reflux disease without esophagitis: Secondary | ICD-10-CM | POA: Diagnosis not present

## 2018-09-09 DIAGNOSIS — M1990 Unspecified osteoarthritis, unspecified site: Secondary | ICD-10-CM | POA: Diagnosis not present

## 2018-09-09 LAB — COMPREHENSIVE METABOLIC PANEL
ALT: 25 U/L (ref 0–44)
AST: 29 U/L (ref 15–41)
Albumin: 3.8 g/dL (ref 3.5–5.0)
Alkaline Phosphatase: 51 U/L (ref 38–126)
Anion gap: 10 (ref 5–15)
BUN: 24 mg/dL — ABNORMAL HIGH (ref 8–23)
CO2: 23 mmol/L (ref 22–32)
Calcium: 9.2 mg/dL (ref 8.9–10.3)
Chloride: 108 mmol/L (ref 98–111)
Creatinine, Ser: 1.57 mg/dL — ABNORMAL HIGH (ref 0.61–1.24)
GFR calc Af Amer: 50 mL/min — ABNORMAL LOW (ref >60)
GFR calc non Af Amer: 43 mL/min — ABNORMAL LOW (ref >60)
Glucose, Bld: 109 mg/dL — ABNORMAL HIGH (ref 70–99)
Potassium: 4.9 mmol/L (ref 3.5–5.1)
Sodium: 141 mmol/L (ref 135–145)
Total Bilirubin: 1.2 mg/dL (ref 0.3–1.2)
Total Protein: 6.4 g/dL — ABNORMAL LOW (ref 6.5–8.1)

## 2018-09-09 LAB — CBC WITH DIFFERENTIAL/PLATELET
Abs Immature Granulocytes: 0.01 10*3/uL (ref 0.00–0.07)
Basophils Absolute: 0 10*3/uL (ref 0.0–0.1)
Basophils Relative: 0 %
Eosinophils Absolute: 0 10*3/uL (ref 0.0–0.5)
Eosinophils Relative: 1 %
HCT: 27.9 % — ABNORMAL LOW (ref 39.0–52.0)
Hemoglobin: 8.2 g/dL — ABNORMAL LOW (ref 13.0–17.0)
Immature Granulocytes: 1 %
Lymphocytes Relative: 63 %
Lymphs Abs: 1.2 10*3/uL (ref 0.7–4.0)
MCH: 33.7 pg (ref 26.0–34.0)
MCHC: 29.4 g/dL — ABNORMAL LOW (ref 30.0–36.0)
MCV: 114.8 fL — ABNORMAL HIGH (ref 80.0–100.0)
Monocytes Absolute: 0.1 10*3/uL (ref 0.1–1.0)
Monocytes Relative: 8 %
Neutro Abs: 0.5 10*3/uL — ABNORMAL LOW (ref 1.7–7.7)
Neutrophils Relative %: 27 %
Platelets: 14 10*3/uL — CL (ref 150–400)
RBC: 2.43 MIL/uL — ABNORMAL LOW (ref 4.22–5.81)
RDW: 22.2 % — ABNORMAL HIGH (ref 11.5–15.5)
WBC: 1.8 10*3/uL — ABNORMAL LOW (ref 4.0–10.5)
nRBC: 4.9 % — ABNORMAL HIGH (ref 0.0–0.2)

## 2018-09-09 MED ORDER — SODIUM CHLORIDE 0.9% IV SOLUTION
250.0000 mL | Freq: Once | INTRAVENOUS | Status: AC
  Administered 2018-09-09: 12:00:00 250 mL via INTRAVENOUS

## 2018-09-09 MED ORDER — HEPARIN SOD (PORK) LOCK FLUSH 100 UNIT/ML IV SOLN
500.0000 [IU] | Freq: Once | INTRAVENOUS | Status: AC
  Administered 2018-09-09: 15:00:00 500 [IU] via INTRAVENOUS

## 2018-09-09 MED ORDER — SODIUM CHLORIDE 0.9% FLUSH
10.0000 mL | INTRAVENOUS | Status: AC | PRN
  Administered 2018-09-09: 10 mL

## 2018-09-09 MED ORDER — HEPARIN SOD (PORK) LOCK FLUSH 100 UNIT/ML IV SOLN: 500.0000 [IU] | Freq: Every day | INTRAVENOUS | Status: DC | PRN

## 2018-09-09 MED ORDER — ACETAMINOPHEN 325 MG PO TABS
650.0000 mg | ORAL_TABLET | Freq: Once | ORAL | Status: AC
  Administered 2018-09-09: 12:00:00 650 mg via ORAL
  Filled 2018-09-09: qty 2

## 2018-09-09 MED ORDER — DIPHENHYDRAMINE HCL 25 MG PO CAPS
25.0000 mg | ORAL_CAPSULE | Freq: Once | ORAL | Status: AC
  Administered 2018-09-09: 12:00:00 25 mg via ORAL
  Filled 2018-09-09: qty 1

## 2018-09-09 NOTE — Progress Notes (Signed)
CRITICAL VALUE STICKER  CRITICAL VALUE: platelets 14  RECEIVER (on-site recipient of call):Dejan Angert, rn  DATE & TIME NOTIFIED: 09/09/2018  MESSENGER (representative from lab):Duwayne Heck  MD NOTIFIED: Delton Coombes  TIME OF NOTIFICATION:1027  RESPONSE: Oncology follow up visit scheduled for today. Transfuse 1 unit of platelets today.  Blood bank notified.

## 2018-09-09 NOTE — Progress Notes (Signed)
[facility] [place] [address] [place] [address]   CLINIC:  Medical Oncology/Hematology  PCP:  [name], MD [address] [place] [address] [phone]   REASON FOR VISIT:  Follow-up for MDS  CURRENT THERAPY: Vidaza every 4 weeks.  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[place])    08/22/2018 -  Chemotherapy    The patient had palonosetron (ALOXI) injection 0.25 mg, 0.25 mg, Intravenous,  Once, 1 of 4 cycles Administration: 0.25 mg (08/22/2018), 0.25 mg (08/26/2018), 0.25 mg (08/28/2018), 0.25 mg (08/30/2018) azaCITIDine (VIDAZA) 100 mg in sodium chloride 0.9 % 50 mL chemo infusion, 110 mg (66.7 % of original dose 75 mg/m2), Intravenous, Once, 1 of 4 cycles Dose modification: 50 mg/m2 (original dose 75 mg/m2, Cycle 1, Reason: Provider Judgment) Administration: 100 mg (08/22/2018), 100 mg (08/23/2018), 100 mg (08/26/2018), 100 mg (08/27/2018), 100 mg (08/28/2018), 100 mg (08/29/2018), 100 mg (08/30/2018)  for chemotherapy treatment.       CANCER STAGING: High-grade MDS   INTERVAL HISTORY:  [name] 73 y.o. male returns for routine follow-up of blood counts and MDS.  He reports severe tiredness last week.  He felt tired even after taking shower.  He denies any fevers or night sweats or weight loss.  Denies any chills.  Denies any bleeding episodes.  He reports redness in his left eye.  No pain at the port site.  No new pains.  Appetite is 100%.  Energy is 50%.   REVIEW OF SYSTEMS:  Review of Systems  Constitutional: Positive for fatigue.  HENT:  Negative.   Eyes: Negative.   Respiratory: Negative.   Cardiovascular: Negative.   Gastrointestinal: Negative.   Genitourinary: Negative.    Musculoskeletal: Negative.   Skin: Negative.   Neurological: Positive for dizziness and light-headedness.  Hematological: Negative.    Psychiatric/Behavioral: Negative.      PAST MEDICAL/SURGICAL HISTORY:  Past Medical History:  Diagnosis Date  . Arthritis   . 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 Cancer ([place]) 01-28-2014   skin cancer  . CKD (chronic kidney disease), stage III ([place])   . GERD (gastroesophageal reflux disease)   . Heart murmur    NOTED DURING PHYSICAL WHEN HE WAS ENLISTING IN [facility] , DIDNT KNOW UNTIL THAT TIME AND REPORTS , "THATS THE LAST I HEARD ABOUT IT "   . History of hypertension    no longer issue  . History of kidney stones   . History of malignant melanoma of skin    excision top of scalp 2015-- no recurrence  . History of urinary retention    post op lumbar fusion surgery 04/ 2016  . Hypertension   . 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 Left ureteral calculus   . Seasonal allergies   . Wears glasses   . Wears glasses   . Wears partial dentures    upper and lower   Past Surgical History:  Procedure Laterality Date  . ANKLE FUSION Right 2007  . CARPAL TUNNEL RELEASE Left 12/28/2009   w/ pulley release left long finger  . CARPAL TUNNEL RELEASE Right 07/22/2013   Procedure: RIGHT CARPAL TUNNEL RELEASE;  Surgeon: Cammie Sickle., MD;  Location: [facility];  Service: Orthopedics;  Laterality: Right;  . COLONOSCOPY    .  CYSTO/  LEFT RETROGRADE PYELOGRAM  11/21/2010  . CYSTOSCOPY WITH STENT PLACEMENT Left 03/09/2016   Procedure: CYSTOSCOPY WITH STENT PLACEMENT;  Surgeon: Franchot Gallo, MD;  Location: [facility];  Service: Urology;  Laterality: Left;  . CYSTOSCOPY/RETROGRADE/URETEROSCOPY/STONE EXTRACTION WITH BASKET Left 03/09/2016   Procedure: CYSTOSCOPY/RETROGRADE/URETEROSCOPY/STONE EXTRACTION WITH BASKET;  Surgeon: Franchot Gallo, MD;  Location: [facility];  Service: Urology;  Laterality: Left;  . LEFT URETEROSCOPIC LASER LITHOTRIPSY STONE  EXTRACTION/ STENT PLACEMENT  05/23/2010  . MOHS SURGERY     TOP OF THE HEAD   . ORIF ANKLE FRACTURE Right 1978  . PORTACATH PLACEMENT Right 08/19/2018   Procedure: INSERTION PORT-A-CATH (attached catheter in right subclavian);  Surgeon: Aviva Signs, MD;  Location: [facility];  Service: General;  Laterality: Right;  . POSTERIOR LUMBAR FUSION  08/21/2014   laminectomy and decompression L2 -- L5  . RIGHT LOWER LEG SURGERY  X3  1975 to 1976   including ORIF  . TONSILLECTOMY AND ADENOIDECTOMY  1986  . UMBILICAL HERNIA REPAIR  2009 approx     SOCIAL HISTORY:  Social History   Socioeconomic History  . Marital status: Married    Spouse name: Not on file  . Number of children: Not on file  . Years of education: Not on file  . Highest education level: Not on file  Occupational History  . Not on file  Social Needs  . Financial resource strain: Not on file  . Food insecurity:    Worry: Not on file    Inability: Not on file  . Transportation needs:    Medical: Not on file    Non-medical: Not on file  Tobacco Use  . Smoking status: Former Smoker    Years: 20.00    Types: Cigarettes    Last attempt to quit: 07/17/1986    Years since quitting: 32.1  . Smokeless tobacco: Never Used  Substance and Sexual Activity  . Alcohol use: Yes    Alcohol/week: 7.0 - 14.0 standard drinks    Types: 7 - 14 Cans of beer per week    Comment: 1 -2 beer daily  . Drug use: No  . Sexual activity: Not on file  Lifestyle  . Physical activity:    Days per week: Not on file    Minutes per session: Not on file  . Stress: Not on file  Relationships  . Social connections:    Talks on phone: Not on file    Gets together: Not on file    Attends religious service: Not on file    Active member of club or organization: Not on file    Attends meetings of clubs or organizations: Not on file    Relationship status: Not on file  . Intimate partner violence:    Fear of current or ex partner: Not on file     Emotionally abused: Not on file    Physically abused: Not on file    Forced sexual activity: Not on file  Other Topics Concern  . Not on file  Social History Narrative  . Not on file    FAMILY HISTORY:  Family History  Problem Relation Age of Onset  . Stroke Mother   . Prostate cancer Father   . Bone cancer Father   . Rheum arthritis Sister   . Urinary tract infection Sister     CURRENT MEDICATIONS:  Outpatient Encounter Medications as of 09/09/2018  Medication Sig  . acetaminophen (TYLENOL) 500 MG tablet Take 1,000  mg by mouth every 6 (six) hours as needed for mild pain or moderate pain.   Marland Kitchen allopurinol (ZYLOPRIM) 300 MG tablet Take 300 mg by mouth every evening.  Marland Kitchen azaCITIDine 5 mg/2 mLs in lactated ringers infusion Inject into the vein daily. Day 1-7 every 28 days (starting 08/22/2018)  . celecoxib (CELEBREX) 200 MG capsule Take 200 mg by mouth daily. IN THE MORNING  . cetirizine (ZYRTEC) 10 MG tablet Take 10 mg by mouth daily. IN THE MORNING  . diclofenac sodium (VOLTAREN) 1 % GEL Apply 1 g topically 3 (three) times daily as needed (knee pain.).   Marland Kitchen docusate sodium (COLACE) 100 MG capsule Take 100 mg by mouth at bedtime.   Marland Kitchen HYDROcodone-acetaminophen (NORCO) 5-325 MG tablet Take 1 tablet by mouth every 4 (four) hours as needed for moderate pain.  Marland Kitchen lansoprazole (PREVACID) 15 MG capsule Take 15 mg by mouth daily before breakfast.   . lidocaine-prilocaine (EMLA) cream Apply pea-sized amount to port a cath site and cover with plastic wrap one hour prior to chemotherapy appointment  . lisinopril (PRINIVIL,ZESTRIL) 5 MG tablet Take 2.5 mg by mouth daily. In the morning  . magnesium oxide (MAG-OX) 400 MG tablet Take 400 mg by mouth at bedtime.   Vladimir Faster Glycol-Propyl Glycol (SYSTANE OP) Place 1 drop into both eyes 2 (two) times daily.  . prochlorperazine (COMPAZINE) 10 MG tablet Take 1 tablet (10 mg total) by mouth every 6 (six) hours as needed (Nausea or vomiting).  . tamsulosin  (FLOMAX) 0.4 MG CAPS capsule Take 0.4 mg by mouth every evening.   . traMADol (ULTRAM) 50 MG tablet Take 50 mg by mouth 3 (three) times daily as needed (pain.).   Marland Kitchen fluticasone (FLONASE) 50 MCG/ACT nasal spray Place 1 spray into both nostrils daily. In the morning   Facility-Administered Encounter Medications as of 09/09/2018  Medication  . 0.9 %  sodium chloride infusion (Manually program via Guardrails IV Fluids)  . acetaminophen (TYLENOL) tablet 650 mg  . diphenhydrAMINE (BENADRYL) capsule 25 mg  . heparin lock flush 100 unit/mL  . sodium chloride flush (NS) 0.9 % injection 10 mL    ALLERGIES:  No Known Allergies   PHYSICAL EXAM:  ECOG Performance status: 1 Blood pressure is 119/61.  Pulse rate is 91.  Respiratory is 20.  Temperature is 98.5.  Saturations are 97%.  Physical Exam Vitals signs reviewed.  Constitutional:      Appearance: Normal appearance.  Cardiovascular:     Rate and Rhythm: Normal rate and regular rhythm.     Heart sounds: Normal heart sounds.  Pulmonary:     Effort: Pulmonary effort is normal.     Breath sounds: Normal breath sounds.  Abdominal:     General: There is no distension.     Palpations: Abdomen is soft. There is no mass.  Musculoskeletal:        General: No swelling.  Skin:    General: Skin is warm.  Neurological:     General: No focal deficit present.     Mental Status: He is alert and oriented to person, place, and time.  Psychiatric:        Mood and Affect: Mood normal.        Behavior: Behavior normal.      LABORATORY DATA:  I have reviewed the labs as listed.  CBC    Component Value Date/Time   WBC 1.8 (L) 09/09/2018 0959   RBC 2.43 (L) 09/09/2018 0959   HGB 8.2 (L)  09/09/2018 0959   HCT 27.9 (L) 09/09/2018 0959   PLT 14 (LL) 09/09/2018 0959   MCV 114.8 (H) 09/09/2018 0959   MCH 33.7 09/09/2018 0959   MCHC 29.4 (L) 09/09/2018 0959   RDW 22.2 (H) 09/09/2018 0959   LYMPHSABS 1.2 09/09/2018 0959   MONOABS 0.1 09/09/2018  0959   EOSABS 0.0 09/09/2018 0959   BASOSABS 0.0 09/09/2018 0959   CMP Latest Ref Rng & Units 09/09/2018 09/02/2018 08/29/2018  Glucose 70 - 99 mg/dL 109(H) 122(H) 123(H)  BUN 8 - 23 mg/dL 24(H) 24(H) 26(H)  Creatinine 0.61 - 1.24 mg/dL 1.57(H) 1.44(H) 1.43(H)  Sodium 135 - 145 mmol/L 141 142 140  Potassium 3.5 - 5.1 mmol/L 4.9 4.9 5.1  Chloride 98 - 111 mmol/L 108 109 108  CO2 22 - 32 mmol/L '23 25 23  ' Calcium 8.9 - 10.3 mg/dL 9.2 9.1 9.2  Total Protein 6.5 - 8.1 g/dL 6.4(L) 6.2(L) 6.2(L)  Total Bilirubin 0.3 - 1.2 mg/dL 1.2 0.7 0.9  Alkaline Phos 38 - 126 U/L 51 57 55  AST 15 - 41 U/L '29 27 24  ' ALT 0 - 44 U/L '25 25 24       ' DIAGNOSTIC IMAGING:  I have independently reviewed the scans and discussed with the patient.   I have reviewed Lilia Pro, NP's note and agree with the documentation.  I personally performed a face-to-face visit, made revisions and my assessment and plan is as follows.    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  Nutritional work-up was negative. 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  Flow cytometry shows 12% blasts. 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  FISH panel is pending. 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 -We discussed the normal prognosis of high-risk MDS.  - Vidaza at 50 mg/m started on 08/22/2018. -He felt extremely weak the whole of last week.  Denied any fevers or night sweats. -Denied any bleeding episodes.  Port site is within normal limits. - He developed left eye subconjunctival hemorrhage.  Today his platelet count is 14.  Hemoglobin improved to 8.2.  White count is 1.8 with ANC of 500.  He was told to go to the ER if he  develops any fever more than 100.5. -I have recommended 1 unit of platelet transfusion. -We will check his blood counts next week.  We will transfuse him if we need set. -I will switch his treatment to 09/23/2018.  2.  CKD: -His creatinine today is 1.57 and stable.    Total time spent is 25 minutes with more than 50% of the time spent face-to-face discussing disease prognosis, treatment related side effect management and coordination of care.    Orders placed this encounter:  Orders Placed This Encounter  Procedures  . CBC with Differential  . Comprehensive metabolic panel  . Practitioner attestation of consent  . Complete patient signature process for consent form  . Care order/instruction  . Prepare Pheresed Platelets      [name], MD [place] [phone]

## 2018-09-09 NOTE — Patient Instructions (Signed)
[place] at [facility] Discharge Instructions  Received 1 unit of platelets today. Follow-up as scheduled. Call for any questions or concerns   Thank you for choosing [place] at [facility] to provide your oncology and hematology care.  To afford each patient quality time with our provider, please arrive at least 15 minutes before your scheduled appointment time.   If you have a lab appointment with the [facility] please come in thru the  Main Entrance and check in at the main information desk  You need to re-schedule your appointment should you arrive 10 or more minutes late.  We strive to give you quality time with our providers, and arriving late affects you and other patients whose appointments are after yours.  Also, if you no show three or more times for appointments you may be dismissed from the clinic at the providers discretion.     Again, thank you for choosing [facility].  Our hope is that these requests will decrease the amount of time that you wait before being seen by our physicians.       _____________________________________________________________  Should you have questions after your visit to [facility], please contact our office at [phone] between the hours of 8:00 a.m. and 4:30 p.m.  Voicemails left after 4:00 p.m. will not be returned until the following business day.  For prescription refill requests, have your pharmacy contact our office and allow 72 hours.    Cancer Center Support Programs:   > Cancer Support Group  2nd Tuesday of the month 1pm-2pm, Journey Room

## 2018-09-09 NOTE — Assessment & Plan Note (Addendum)
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  Nutritional work-up was negative. 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  Flow cytometry shows 12% blasts. 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  FISH panel is pending. 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 -We discussed the normal prognosis of high-risk MDS.  - Vidaza at 50 mg/m started on 08/22/2018. -He felt extremely weak the whole of last week.  Denied any fevers or night sweats. -Denied any bleeding episodes.  Port site is within normal limits. - He developed left eye subconjunctival hemorrhage.  Today his platelet count is 14.  Hemoglobin improved to 8.2.  White count is 1.8 with ANC of 500.  He was told to go to the ER if he develops any fever more than 100.5. -I have recommended 1 unit of platelet transfusion. -We will check his blood counts next week.  We will transfuse him if we need set. -I will switch his treatment to 09/23/2018.  2.  CKD: -His creatinine today is 1.57 and stable.

## 2018-09-09 NOTE — Progress Notes (Signed)
1220 Labs reviewed with and pt seen by Dr. Delton Coombes today. Chemo tx to be held this week and pt to receive 1 unit of platelets today since platelets are 14 per MD order.                                                               Richard Wallace tolerated platelet transfusion well without complaints or incident. VSS upon discharge. Pt discharged self ambulatory in satisfactory condition

## 2018-09-10 LAB — BPAM PLATELET PHERESIS
Blood Product Expiration Date: 202005052359
ISSUE DATE / TIME: 202005041336
Unit Type and Rh: 6200

## 2018-09-10 LAB — PREPARE PLATELET PHERESIS: Unit division: 0

## 2018-09-23 ENCOUNTER — Inpatient Hospital Stay (HOSPITAL_COMMUNITY): Payer: Medicare Other

## 2018-09-23 ENCOUNTER — Encounter (HOSPITAL_COMMUNITY): Payer: Self-pay | Admitting: Hematology

## 2018-09-23 ENCOUNTER — Other Ambulatory Visit: Payer: Self-pay

## 2018-09-23 ENCOUNTER — Inpatient Hospital Stay (HOSPITAL_BASED_OUTPATIENT_CLINIC_OR_DEPARTMENT_OTHER): Payer: Medicare Other | Admitting: Hematology

## 2018-09-23 VITALS — BP 123/65

## 2018-09-23 DIAGNOSIS — D61818 Other pancytopenia: Secondary | ICD-10-CM

## 2018-09-23 DIAGNOSIS — Z95828 Presence of other vascular implants and grafts: Secondary | ICD-10-CM

## 2018-09-23 DIAGNOSIS — D46Z Other myelodysplastic syndromes: Secondary | ICD-10-CM

## 2018-09-23 DIAGNOSIS — I129 Hypertensive chronic kidney disease with stage 1 through stage 4 chronic kidney disease, or unspecified chronic kidney disease: Secondary | ICD-10-CM | POA: Diagnosis not present

## 2018-09-23 DIAGNOSIS — D469 Myelodysplastic syndrome, unspecified: Secondary | ICD-10-CM

## 2018-09-23 DIAGNOSIS — N183 Chronic kidney disease, stage 3 (moderate): Secondary | ICD-10-CM

## 2018-09-23 DIAGNOSIS — Z79899 Other long term (current) drug therapy: Secondary | ICD-10-CM

## 2018-09-23 LAB — CBC WITH DIFFERENTIAL/PLATELET
Abs Immature Granulocytes: 0.01 10*3/uL (ref 0.00–0.07)
Basophils Absolute: 0 10*3/uL (ref 0.0–0.1)
Basophils Relative: 0 %
Eosinophils Absolute: 0 10*3/uL (ref 0.0–0.5)
Eosinophils Relative: 0 %
HCT: 28.1 % — ABNORMAL LOW (ref 39.0–52.0)
Hemoglobin: 8.2 g/dL — ABNORMAL LOW (ref 13.0–17.0)
Immature Granulocytes: 1 %
Lymphocytes Relative: 58 %
Lymphs Abs: 1 10*3/uL (ref 0.7–4.0)
MCH: 32.9 pg (ref 26.0–34.0)
MCHC: 29.2 g/dL — ABNORMAL LOW (ref 30.0–36.0)
MCV: 112.9 fL — ABNORMAL HIGH (ref 80.0–100.0)
Monocytes Absolute: 0.2 10*3/uL (ref 0.1–1.0)
Monocytes Relative: 12 %
Neutro Abs: 0.5 10*3/uL — ABNORMAL LOW (ref 1.7–7.7)
Neutrophils Relative %: 29 %
Platelets: 74 10*3/uL — ABNORMAL LOW (ref 150–400)
RBC: 2.49 MIL/uL — ABNORMAL LOW (ref 4.22–5.81)
RDW: 19.9 % — ABNORMAL HIGH (ref 11.5–15.5)
WBC: 1.6 10*3/uL — ABNORMAL LOW (ref 4.0–10.5)
nRBC: 3.1 % — ABNORMAL HIGH (ref 0.0–0.2)

## 2018-09-23 LAB — COMPREHENSIVE METABOLIC PANEL
ALT: 29 U/L (ref 0–44)
AST: 30 U/L (ref 15–41)
Albumin: 3.9 g/dL (ref 3.5–5.0)
Alkaline Phosphatase: 52 U/L (ref 38–126)
Anion gap: 9 (ref 5–15)
BUN: 34 mg/dL — ABNORMAL HIGH (ref 8–23)
CO2: 22 mmol/L (ref 22–32)
Calcium: 9.4 mg/dL (ref 8.9–10.3)
Chloride: 110 mmol/L (ref 98–111)
Creatinine, Ser: 1.49 mg/dL — ABNORMAL HIGH (ref 0.61–1.24)
GFR calc Af Amer: 53 mL/min — ABNORMAL LOW (ref >60)
GFR calc non Af Amer: 46 mL/min — ABNORMAL LOW (ref >60)
Glucose, Bld: 136 mg/dL — ABNORMAL HIGH (ref 70–99)
Potassium: 4.3 mmol/L (ref 3.5–5.1)
Sodium: 141 mmol/L (ref 135–145)
Total Bilirubin: 1.4 mg/dL — ABNORMAL HIGH (ref 0.3–1.2)
Total Protein: 6.4 g/dL — ABNORMAL LOW (ref 6.5–8.1)

## 2018-09-23 MED ORDER — PALONOSETRON HCL INJECTION 0.25 MG/5ML
0.2500 mg | Freq: Once | INTRAVENOUS | Status: AC
  Administered 2018-09-23: 0.25 mg via INTRAVENOUS

## 2018-09-23 MED ORDER — HEPARIN SOD (PORK) LOCK FLUSH 100 UNIT/ML IV SOLN
500.0000 [IU] | Freq: Once | INTRAVENOUS | Status: AC | PRN
  Administered 2018-09-23: 500 [IU]

## 2018-09-23 MED ORDER — SODIUM CHLORIDE 0.9 % IV SOLN
100.0000 mg | Freq: Once | INTRAVENOUS | Status: AC
  Administered 2018-09-23: 100 mg via INTRAVENOUS
  Filled 2018-09-23: qty 10

## 2018-09-23 MED ORDER — SODIUM CHLORIDE 0.9% FLUSH
10.0000 mL | INTRAVENOUS | Status: DC | PRN
  Administered 2018-09-23: 10 mL
  Filled 2018-09-23: qty 10

## 2018-09-23 MED ORDER — SODIUM CHLORIDE 0.9 % IV SOLN
Freq: Once | INTRAVENOUS | Status: AC
  Administered 2018-09-23: 10:00:00 via INTRAVENOUS

## 2018-09-23 MED ORDER — PALONOSETRON HCL INJECTION 0.25 MG/5ML
INTRAVENOUS | Status: AC
  Filled 2018-09-23: qty 5

## 2018-09-23 NOTE — Progress Notes (Signed)
[facility] [address], [place] [address]   CLINIC:  Medical Oncology/Hematology  PCP:  [name], MD [address] [place] [address] [phone]   REASON FOR VISIT:  Follow-up for high-grade MDS.   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[place])    08/22/2018 -  Chemotherapy    The patient had palonosetron (ALOXI) injection 0.25 mg, 0.25 mg, Intravenous,  Once, 1 of 4 cycles Administration: 0.25 mg (08/22/2018), 0.25 mg (08/26/2018), 0.25 mg (08/28/2018), 0.25 mg (08/30/2018) azaCITIDine (VIDAZA) 100 mg in sodium chloride 0.9 % 50 mL chemo infusion, 110 mg (66.7 % of original dose 75 mg/m2), Intravenous, Once, 1 of 4 cycles Dose modification: 50 mg/m2 (original dose 75 mg/m2, Cycle 1, Reason: Provider Judgment), 50 mg/m2 (original dose 75 mg/m2, Cycle 2, Reason: Provider Judgment) Administration: 100 mg (08/22/2018), 100 mg (08/23/2018), 100 mg (08/26/2018), 100 mg (08/27/2018), 100 mg (08/28/2018), 100 mg (08/29/2018), 100 mg (08/30/2018)  for chemotherapy treatment.       CANCER STAGING: Cancer Staging No matching staging information was found for the patient.   INTERVAL HISTORY:  [name] 73 y.o. male returns for routine follow-up and consideration for next cycle of chemotherapy. He is here today alone. He states that he has had dizziness and almost passed out once since his last visit. He was advised to stop his Lisinopril due to this. Denies any nausea, vomiting, or diarrhea. Denies any new pains. Had not noticed any recent bleeding such as epistaxis, hematuria or hematochezia. Denies recent chest pain on exertion, shortness of breath on minimal exertion, pre-syncopal episodes, or palpitations. Denies any numbness or tingling in hands or feet. Denies any recent fevers, infections, or recent hospitalizations. Patient reports appetite at  100% and energy level at 0%.     REVIEW OF SYSTEMS:  Review of Systems  Constitutional: Positive for fatigue.  HENT:   Positive for nosebleeds.   Neurological: Positive for dizziness.     PAST MEDICAL/SURGICAL HISTORY:  Past Medical History:  Diagnosis Date  . Arthritis   . 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 Cancer ([place]) 01-28-2014   skin cancer  . CKD (chronic kidney disease), stage III ([place])   . GERD (gastroesophageal reflux disease)   . Heart murmur    NOTED DURING PHYSICAL WHEN HE WAS ENLISTING IN MILITARY , DIDNT KNOW UNTIL THAT TIME AND REPORTS , "THATS THE LAST I HEARD ABOUT IT "   . History of hypertension    no longer issue  . History of kidney stones   . History of malignant melanoma of skin    excision top of scalp 2015-- no recurrence  . History of urinary retention    post op lumbar fusion surgery 04/ 2016  . Hypertension   . 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 Left ureteral calculus   . Seasonal allergies   . Wears glasses   . Wears glasses   . Wears partial dentures    upper and lower   Past Surgical History:  Procedure Laterality Date  . ANKLE FUSION Right 2007  . CARPAL TUNNEL RELEASE Left 12/28/2009   w/ pulley release left long finger  . CARPAL TUNNEL RELEASE Right 07/22/2013   Procedure: RIGHT CARPAL TUNNEL RELEASE;  Surgeon: Youlanda Mighty Sypher  Brooke Bonito., MD;  Location: [facility];  Service: Orthopedics;  Laterality: Right;  . COLONOSCOPY    . CYSTO/  LEFT RETROGRADE PYELOGRAM  11/21/2010  . CYSTOSCOPY WITH STENT PLACEMENT Left 03/09/2016   Procedure: CYSTOSCOPY WITH STENT PLACEMENT;  Surgeon: Franchot Gallo, MD;  [facility];  Service: Urology;  Laterality: Left;  . CYSTOSCOPY/RETROGRADE/URETEROSCOPY/STONE EXTRACTION WITH BASKET Left 03/09/2016   Procedure: CYSTOSCOPY/RETROGRADE/URETEROSCOPY/STONE EXTRACTION WITH BASKET;   Surgeon: Franchot Gallo, MD;  Location: [facility];  Service: Urology;  Laterality: Left;  . LEFT URETEROSCOPIC LASER LITHOTRIPSY STONE EXTRACTION/ STENT PLACEMENT  05/23/2010  . MOHS SURGERY     TOP OF THE HEAD   . ORIF ANKLE FRACTURE Right 1978  . PORTACATH PLACEMENT Right 08/19/2018   Procedure: INSERTION PORT-A-CATH (attached catheter in right subclavian);  Surgeon: Aviva Signs, MD;  Location: [facility];  Service: General;  Laterality: Right;  . POSTERIOR LUMBAR FUSION  08/21/2014   laminectomy and decompression L2 -- L5  . RIGHT LOWER LEG SURGERY  X3  1975 to 1976   including ORIF  . TONSILLECTOMY AND ADENOIDECTOMY  1986  . UMBILICAL HERNIA REPAIR  2009 approx     SOCIAL HISTORY:  Social History   Socioeconomic History  . Marital status: Married    Spouse name: Not on file  . Number of children: Not on file  . Years of education: Not on file  . Highest education level: Not on file  Occupational History  . Not on file  Social Needs  . Financial resource strain: Not on file  . Food insecurity:    Worry: Not on file    Inability: Not on file  . Transportation needs:    Medical: Not on file    Non-medical: Not on file  Tobacco Use  . Smoking status: Former Smoker    Years: 20.00    Types: Cigarettes    Last attempt to quit: 07/17/1986    Years since quitting: 32.2  . Smokeless tobacco: Never Used  Substance and Sexual Activity  . Alcohol use: Yes    Alcohol/week: 7.0 - 14.0 standard drinks    Types: 7 - 14 Cans of beer per week    Comment: 1 -2 beer daily  . Drug use: No  . Sexual activity: Not on file  Lifestyle  . Physical activity:    Days per week: Not on file    Minutes per session: Not on file  . Stress: Not on file  Relationships  . Social connections:    Talks on phone: Not on file    Gets together: Not on file    Attends religious service: Not on file    Active member of club or organization: Not on file    Attends meetings of  clubs or organizations: Not on file    Relationship status: Not on file  . Intimate partner violence:    Fear of current or ex partner: Not on file    Emotionally abused: Not on file    Physically abused: Not on file    Forced sexual activity: Not on file  Other Topics Concern  . Not on file  Social History Narrative  . Not on file    FAMILY HISTORY:  Family History  Problem Relation Age of Onset  . Stroke Mother   . Prostate cancer Father   . Bone cancer Father   . Rheum arthritis Sister   . Urinary tract infection Sister  CURRENT MEDICATIONS:  Outpatient Encounter Medications as of 09/23/2018  Medication Sig  . acetaminophen (TYLENOL) 500 MG tablet Take 1,000 mg by mouth every 6 (six) hours as needed for mild pain or moderate pain.   Marland Kitchen allopurinol (ZYLOPRIM) 300 MG tablet Take 300 mg by mouth every evening.  Marland Kitchen azaCITIDine 5 mg/2 mLs in lactated ringers infusion Inject into the vein daily. Day 1-7 every 28 days (starting 08/22/2018)  . celecoxib (CELEBREX) 200 MG capsule Take 200 mg by mouth daily. IN THE MORNING  . cetirizine (ZYRTEC) 10 MG tablet Take 10 mg by mouth daily. IN THE MORNING  . diclofenac sodium (VOLTAREN) 1 % GEL Apply 1 g topically 3 (three) times daily as needed (knee pain.).   Marland Kitchen docusate sodium (COLACE) 100 MG capsule Take 100 mg by mouth at bedtime.   . fluticasone (FLONASE) 50 MCG/ACT nasal spray Place 1 spray into both nostrils daily. In the morning  . HYDROcodone-acetaminophen (NORCO) 5-325 MG tablet Take 1 tablet by mouth every 4 (four) hours as needed for moderate pain.  Marland Kitchen lansoprazole (PREVACID) 15 MG capsule Take 15 mg by mouth daily before breakfast.   . lidocaine-prilocaine (EMLA) cream Apply pea-sized amount to port a cath site and cover with plastic wrap one hour prior to chemotherapy appointment  . lisinopril (PRINIVIL,ZESTRIL) 5 MG tablet Take 2.5 mg by mouth daily. In the morning  . magnesium oxide (MAG-OX) 400 MG tablet Take 400 mg by mouth  at bedtime.   Vladimir Faster Glycol-Propyl Glycol (SYSTANE OP) Place 1 drop into both eyes 2 (two) times daily.  . prochlorperazine (COMPAZINE) 10 MG tablet Take 1 tablet (10 mg total) by mouth every 6 (six) hours as needed (Nausea or vomiting).  . tamsulosin (FLOMAX) 0.4 MG CAPS capsule Take 0.4 mg by mouth every evening.   . traMADol (ULTRAM) 50 MG tablet Take 50 mg by mouth 3 (three) times daily as needed (pain.).    No facility-administered encounter medications on file as of 09/23/2018.     ALLERGIES:  No Known Allergies   PHYSICAL EXAM:  ECOG Performance status: 1  Vitals:   09/23/18 0800  BP: (!) 119/58  Pulse: (!) 105  Resp: 20  Temp: 98.4 F (36.9 C)  SpO2: 99%   Filed Weights   09/23/18 0800  Weight: 230 lb 6 oz (104.5 kg)    Physical Exam Vitals signs reviewed.  Constitutional:      Appearance: Normal appearance.  Cardiovascular:     Rate and Rhythm: Normal rate and regular rhythm.     Heart sounds: Normal heart sounds.  Pulmonary:     Effort: Pulmonary effort is normal.     Breath sounds: Normal breath sounds.  Abdominal:     General: There is no distension.     Palpations: Abdomen is soft. There is no mass.  Musculoskeletal:        General: No swelling.  Skin:    General: Skin is warm.  Neurological:     General: No focal deficit present.     Mental Status: He is alert and oriented to person, place, and time.  Psychiatric:        Mood and Affect: Mood normal.        Behavior: Behavior normal.      LABORATORY DATA:  I have reviewed the labs as listed.  CBC    Component Value Date/Time   WBC 1.6 (L) 09/23/2018 0835   RBC 2.49 (L) 09/23/2018 0835   HGB 8.2 (L)  09/23/2018 0835   HCT 28.1 (L) 09/23/2018 0835   PLT 74 (L) 09/23/2018 0835   MCV 112.9 (H) 09/23/2018 0835   MCH 32.9 09/23/2018 0835   MCHC 29.2 (L) 09/23/2018 0835   RDW 19.9 (H) 09/23/2018 0835   LYMPHSABS 1.0 09/23/2018 0835   MONOABS 0.2 09/23/2018 0835   EOSABS 0.0 09/23/2018  0835   BASOSABS 0.0 09/23/2018 0835   CMP Latest Ref Rng & Units 09/23/2018 09/09/2018 09/02/2018  Glucose 70 - 99 mg/dL 136(H) 109(H) 122(H)  BUN 8 - 23 mg/dL 34(H) 24(H) 24(H)  Creatinine 0.61 - 1.24 mg/dL 1.49(H) 1.57(H) 1.44(H)  Sodium 135 - 145 mmol/L 141 141 142  Potassium 3.5 - 5.1 mmol/L 4.3 4.9 4.9  Chloride 98 - 111 mmol/L 110 108 109  CO2 22 - 32 mmol/L '22 23 25  ' Calcium 8.9 - 10.3 mg/dL 9.4 9.2 9.1  Total Protein 6.5 - 8.1 g/dL 6.4(L) 6.4(L) 6.2(L)  Total Bilirubin 0.3 - 1.2 mg/dL 1.4(H) 1.2 0.7  Alkaline Phos 38 - 126 U/L 52 51 57  AST 15 - 41 U/L '30 29 27  ' ALT 0 - 44 U/L '29 25 25       ' DIAGNOSTIC IMAGING:  I have independently reviewed the scans and discussed with the patient.   I have reviewed Venita Lick LPN's note and agree with the documentation.  I personally performed a face-to-face visit, made revisions and my assessment and plan is as follows.    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  Nutritional work-up was negative. 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  Flow cytometry shows 12% blasts. 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  FISH panel is pending. 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 - Vidaza (50 mg/m2) cycle 1 on 08/22/2018. -He received 1 unit of platelet transfusion on 09/09/2018.  He had one nosebleed which was self-contained. - He reported dizziness when he stands.  Systolic blood pressure is 119.  We will discontinue lisinopril. -I have reviewed his blood work.  White count is 1.6.  Platelet count improved to 74.  Hemoglobin is 8.2. - I will maintain the same dose of Vidaza during cycle 2. -We will continue to monitor his labs on a  weekly basis.  2.  CKD: -His creatinine today is 1.49 and stable.   Total time spent is 25 minutes with more than 50% of the time spent face-to-face discussing disease prognosis, management and coordination of care.    Orders placed this encounter:  No orders of the defined types were placed in this encounter.     [name], MD [place] [phone]

## 2018-09-23 NOTE — Assessment & Plan Note (Signed)
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  Nutritional work-up was negative. 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  Flow cytometry shows 12% blasts. 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  FISH panel is pending. 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 - Vidaza (50 mg/m2) cycle 1 on 08/22/2018. -He received 1 unit of platelet transfusion on 09/09/2018.  He had one nosebleed which was self-contained. - He reported dizziness when he stands.  Systolic blood pressure is 119.  We will discontinue lisinopril. -I have reviewed his blood work.  White count is 1.6.  Platelet count improved to 74.  Hemoglobin is 8.2. - I will maintain the same dose of Vidaza during cycle 2. -We will continue to monitor his labs on a weekly basis.  2.  CKD: -His creatinine today is 1.49 and stable.

## 2018-09-23 NOTE — Patient Instructions (Addendum)
[facility] at [facility] Discharge Instructions  You were seen today by Dr. Delton Coombes. He went over your recent lab results. He will see you back in 2 weeks for labs and follow up.  Stop taking the Lisinopril because of the dizziness.  Thank you for choosing [place] at [facility] to provide your oncology and hematology care.  To afford each patient quality time with our provider, please arrive at least 15 minutes before your scheduled appointment time.   If you have a lab appointment with the [facility] please come in thru the  Main Entrance and check in at the main information desk  You need to re-schedule your appointment should you arrive 10 or more minutes late.  We strive to give you quality time with our providers, and arriving late affects you and other patients whose appointments are after yours.  Also, if you no show three or more times for appointments you may be dismissed from the clinic at the providers discretion.     Again, thank you for choosing [facility].  Our hope is that these requests will decrease the amount of time that you wait before being seen by our physicians.       _____________________________________________________________  Should you have questions after your visit to [facility], please contact our office at [phone] between the hours of 8:00 a.m. and 4:30 p.m.  Voicemails left after 4:00 p.m. will not be returned until the following business day.  For prescription refill requests, have your pharmacy contact our office and allow 72 hours.    Cancer Center Support Programs:   > Cancer Support Group  2nd Tuesday of the month 1pm-2pm, Journey Room

## 2018-09-23 NOTE — Progress Notes (Signed)
0950 lab work reviewed and patient seen by Dr. Delton Coombes who approved patient for treatment today. ANC of 0.5 and Platelet count of 74 reviewed with Dr.Katragadda and he states it is still ok to proceed with treatment today.  [name] tolerated Vidaza without incident or complaint. VSS. Port flushed and left accessed for use tomorrow. Discharged self ambulatory in satisfactory condition.

## 2018-09-23 NOTE — Patient Instructions (Signed)
[facility] Discharge Instructions for Patients Receiving Chemotherapy   Beginning January 23rd 2017 lab work for the Cancer Center will be done in the  Main lab at [facility] on 1st floor. If you have a lab appointment with the Cancer Center please come in thru the  Main Entrance and check in at the main information desk   Today you received the following chemotherapy agents Vidaza  To help prevent nausea and vomiting after your treatment, we encourage you to take your nausea medication   If you develop nausea and vomiting, or diarrhea that is not controlled by your medication, call the clinic.  The clinic phone number is [phone]. Office hours are Monday-Friday 8:30am-5:00pm.  BELOW ARE SYMPTOMS THAT SHOULD BE REPORTED IMMEDIATELY:  *FEVER GREATER THAN 101.0 F  *CHILLS WITH OR WITHOUT FEVER  NAUSEA AND VOMITING THAT IS NOT CONTROLLED WITH YOUR NAUSEA MEDICATION  *UNUSUAL SHORTNESS OF BREATH  *UNUSUAL BRUISING OR BLEEDING  TENDERNESS IN MOUTH AND THROAT WITH OR WITHOUT PRESENCE OF ULCERS  *URINARY PROBLEMS  *BOWEL PROBLEMS  UNUSUAL RASH Items with * indicate a potential emergency and should be followed up as soon as possible. If you have an emergency after office hours please contact your primary care physician or go to the nearest emergency department.  Please call the clinic during office hours if you have any questions or concerns.   You may also contact the Patient Navigator at [phone] should you have any questions or need assistance in obtaining follow up care.      Resources For Cancer Patients and their Caregivers ? American Cancer Society: Can assist with transportation, wigs, general needs, runs Look Good Feel Better.        1-888-227-6333 ? Cancer Care: Provides financial assistance, online support groups, medication/co-pay assistance.  1-800-813-HOPE (4673) ? [facility] Assists [place] cancer  patients and their families through emotional , educational and financial support.  336-427-4357 ? [place] DSS Where to apply for food stamps, Medicaid and utility assistance. 336-342-1394 ? [facility]: Transportation to medical appointments. 336-347-2287 ? Social Security Administration: May apply for disability if have a Stage IV cancer. 336-342-7796 1-800-772-1213 ? [place] Aging, Disability and Transit Services: Assists with nutrition, care and transit needs. 336-349-2343          

## 2018-09-24 ENCOUNTER — Encounter (HOSPITAL_COMMUNITY): Payer: Self-pay

## 2018-09-24 ENCOUNTER — Inpatient Hospital Stay (HOSPITAL_COMMUNITY): Payer: Medicare Other

## 2018-09-24 VITALS — BP 111/77 | HR 86 | Temp 98.5°F | Resp 18 | Wt 230.2 lb

## 2018-09-24 DIAGNOSIS — D469 Myelodysplastic syndrome, unspecified: Secondary | ICD-10-CM | POA: Diagnosis not present

## 2018-09-24 DIAGNOSIS — D46Z Other myelodysplastic syndromes: Secondary | ICD-10-CM

## 2018-09-24 DIAGNOSIS — Z95828 Presence of other vascular implants and grafts: Secondary | ICD-10-CM

## 2018-09-24 MED ORDER — SODIUM CHLORIDE 0.9% FLUSH
10.0000 mL | INTRAVENOUS | Status: DC | PRN
  Administered 2018-09-24: 10 mL
  Filled 2018-09-24: qty 10

## 2018-09-24 MED ORDER — SODIUM CHLORIDE 0.9 % IV SOLN
Freq: Once | INTRAVENOUS | Status: AC
  Administered 2018-09-24: 11:00:00 via INTRAVENOUS

## 2018-09-24 MED ORDER — HEPARIN SOD (PORK) LOCK FLUSH 100 UNIT/ML IV SOLN
500.0000 [IU] | Freq: Once | INTRAVENOUS | Status: AC | PRN
  Administered 2018-09-24: 500 [IU]

## 2018-09-24 MED ORDER — SODIUM CHLORIDE 0.9 % IV SOLN
100.0000 mg | Freq: Once | INTRAVENOUS | Status: AC
  Administered 2018-09-24: 100 mg via INTRAVENOUS
  Filled 2018-09-24: qty 10

## 2018-09-24 NOTE — Progress Notes (Signed)
Richard Wallace tolerated Vidaza infusion well without complaints or incident. VSS upon discharge. Pt discharged self ambulatory in satisfactory condition

## 2018-09-24 NOTE — Patient Instructions (Signed)
[facility] Discharge Instructions for Patients Receiving Chemotherapy   Beginning January 23rd 2017 lab work for the Cancer Center will be done in the  Main lab at [facility] on 1st floor. If you have a lab appointment with the Cancer Center please come in thru the  Main Entrance and check in at the main information desk   Today you received the following chemotherapy agents Vidaza. Follow up as scheduled. Call clinic for any questions or concerns.   To help prevent nausea and vomiting after your treatment, we encourage you to take your nausea medication   If you develop nausea and vomiting, or diarrhea that is not controlled by your medication, call the clinic.  The clinic phone number is [phone]. Office hours are Monday-Friday 8:30am-5:00pm.  BELOW ARE SYMPTOMS THAT SHOULD BE REPORTED IMMEDIATELY:  *FEVER GREATER THAN 101.0 F  *CHILLS WITH OR WITHOUT FEVER  NAUSEA AND VOMITING THAT IS NOT CONTROLLED WITH YOUR NAUSEA MEDICATION  *UNUSUAL SHORTNESS OF BREATH  *UNUSUAL BRUISING OR BLEEDING  TENDERNESS IN MOUTH AND THROAT WITH OR WITHOUT PRESENCE OF ULCERS  *URINARY PROBLEMS  *BOWEL PROBLEMS  UNUSUAL RASH Items with * indicate a potential emergency and should be followed up as soon as possible. If you have an emergency after office hours please contact your primary care physician or go to the nearest emergency department.  Please call the clinic during office hours if you have any questions or concerns.   You may also contact the Patient Navigator at [phone] should you have any questions or need assistance in obtaining follow up care.      Resources For Cancer Patients and their Caregivers ? American Cancer Society: Can assist with transportation, wigs, general needs, runs Look Good Feel Better.        1-888-227-6333 ? Cancer Care: Provides financial assistance, online support groups, medication/co-pay assistance.  1-800-813-HOPE  (4673) ? [facility] Assists [place] cancer patients and their families through emotional , educational and financial support.  336-427-4357 ? [place] DSS Where to apply for food stamps, Medicaid and utility assistance. 336-342-1394 ? [facility]: Transportation to medical appointments. 336-347-2287 ? Social Security Administration: May apply for disability if have a Stage IV cancer. 336-342-7796 1-800-772-1213 ? [place] Aging, Disability and Transit Services: Assists with nutrition, care and transit needs. [phone]          

## 2018-09-25 ENCOUNTER — Other Ambulatory Visit: Payer: Self-pay

## 2018-09-25 ENCOUNTER — Inpatient Hospital Stay (HOSPITAL_COMMUNITY): Payer: Medicare Other

## 2018-09-25 ENCOUNTER — Encounter (HOSPITAL_COMMUNITY): Payer: Self-pay

## 2018-09-25 VITALS — BP 136/62 | HR 81 | Temp 98.4°F | Resp 18 | Wt 229.2 lb

## 2018-09-25 DIAGNOSIS — D46Z Other myelodysplastic syndromes: Secondary | ICD-10-CM

## 2018-09-25 DIAGNOSIS — Z95828 Presence of other vascular implants and grafts: Secondary | ICD-10-CM

## 2018-09-25 DIAGNOSIS — D469 Myelodysplastic syndrome, unspecified: Secondary | ICD-10-CM | POA: Diagnosis not present

## 2018-09-25 MED ORDER — HEPARIN SOD (PORK) LOCK FLUSH 100 UNIT/ML IV SOLN
500.0000 [IU] | Freq: Once | INTRAVENOUS | Status: AC | PRN
  Administered 2018-09-25: 500 [IU]

## 2018-09-25 MED ORDER — SODIUM CHLORIDE 0.9 % IV SOLN
Freq: Once | INTRAVENOUS | Status: AC
  Administered 2018-09-25: 11:00:00 via INTRAVENOUS

## 2018-09-25 MED ORDER — SODIUM CHLORIDE 0.9% FLUSH
10.0000 mL | INTRAVENOUS | Status: DC | PRN
  Administered 2018-09-25: 10 mL
  Filled 2018-09-25: qty 10

## 2018-09-25 MED ORDER — SODIUM CHLORIDE 0.9 % IV SOLN
100.0000 mg | Freq: Once | INTRAVENOUS | Status: AC
  Administered 2018-09-25: 100 mg via INTRAVENOUS
  Filled 2018-09-25: qty 10

## 2018-09-25 MED ORDER — PALONOSETRON HCL INJECTION 0.25 MG/5ML
0.2500 mg | Freq: Once | INTRAVENOUS | Status: AC
  Administered 2018-09-25: 0.25 mg via INTRAVENOUS
  Filled 2018-09-25: qty 5

## 2018-09-25 NOTE — Progress Notes (Signed)
[name] tolerated Vidaza infusion well without complaints or incident. Port left accessed,saline locked and flushed for use tomorrow. VSS upon discharge. Pt discharged self ambulatory in satisfactory condition

## 2018-09-25 NOTE — Patient Instructions (Signed)
[facility] Discharge Instructions for Patients Receiving Chemotherapy   Beginning January 23rd 2017 lab work for the Cancer Center will be done in the  Main lab at [facility] on 1st floor. If you have a lab appointment with the Cancer Center please come in thru the  Main Entrance and check in at the main information desk   Today you received the following chemotherapy agents Vidaza. Follow up as scheduled. Call clinic for any questions or concerns.   To help prevent nausea and vomiting after your treatment, we encourage you to take your nausea medication   If you develop nausea and vomiting, or diarrhea that is not controlled by your medication, call the clinic.  The clinic phone number is [phone]. Office hours are Monday-Friday 8:30am-5:00pm.  BELOW ARE SYMPTOMS THAT SHOULD BE REPORTED IMMEDIATELY:  *FEVER GREATER THAN 101.0 F  *CHILLS WITH OR WITHOUT FEVER  NAUSEA AND VOMITING THAT IS NOT CONTROLLED WITH YOUR NAUSEA MEDICATION  *UNUSUAL SHORTNESS OF BREATH  *UNUSUAL BRUISING OR BLEEDING  TENDERNESS IN MOUTH AND THROAT WITH OR WITHOUT PRESENCE OF ULCERS  *URINARY PROBLEMS  *BOWEL PROBLEMS  UNUSUAL RASH Items with * indicate a potential emergency and should be followed up as soon as possible. If you have an emergency after office hours please contact your primary care physician or go to the nearest emergency department.  Please call the clinic during office hours if you have any questions or concerns.   You may also contact the Patient Navigator at [phone] should you have any questions or need assistance in obtaining follow up care.      Resources For Cancer Patients and their Caregivers ? American Cancer Society: Can assist with transportation, wigs, general needs, runs Look Good Feel Better.        1-888-227-6333 ? Cancer Care: Provides financial assistance, online support groups, medication/co-pay assistance.  1-800-813-HOPE  (4673) ? [facility] Assists [place] cancer patients and their families through emotional , educational and financial support.  336-427-4357 ? [place] DSS Where to apply for food stamps, Medicaid and utility assistance. 336-342-1394 ? [facility]: Transportation to medical appointments. 336-347-2287 ? Social Security Administration: May apply for disability if have a Stage IV cancer. 336-342-7796 1-800-772-1213 ? [place] Aging, Disability and Transit Services: Assists with nutrition, care and transit needs. 336-349-2343          

## 2018-09-26 ENCOUNTER — Inpatient Hospital Stay (HOSPITAL_COMMUNITY): Payer: Medicare Other

## 2018-09-26 ENCOUNTER — Encounter (HOSPITAL_COMMUNITY): Payer: Self-pay

## 2018-09-26 VITALS — BP 135/72 | HR 83 | Temp 98.4°F | Resp 18 | Wt 229.0 lb

## 2018-09-26 DIAGNOSIS — D469 Myelodysplastic syndrome, unspecified: Secondary | ICD-10-CM | POA: Diagnosis not present

## 2018-09-26 DIAGNOSIS — D46Z Other myelodysplastic syndromes: Secondary | ICD-10-CM

## 2018-09-26 DIAGNOSIS — Z95828 Presence of other vascular implants and grafts: Secondary | ICD-10-CM

## 2018-09-26 MED ORDER — SODIUM CHLORIDE 0.9% FLUSH
10.0000 mL | INTRAVENOUS | Status: DC | PRN
  Administered 2018-09-26: 10 mL
  Filled 2018-09-26: qty 10

## 2018-09-26 MED ORDER — SODIUM CHLORIDE 0.9 % IV SOLN
Freq: Once | INTRAVENOUS | Status: AC
  Administered 2018-09-26: 11:00:00 via INTRAVENOUS

## 2018-09-26 MED ORDER — HEPARIN SOD (PORK) LOCK FLUSH 100 UNIT/ML IV SOLN
500.0000 [IU] | Freq: Once | INTRAVENOUS | Status: AC | PRN
  Administered 2018-09-26: 500 [IU]

## 2018-09-26 MED ORDER — SODIUM CHLORIDE 0.9 % IV SOLN
100.0000 mg | Freq: Once | INTRAVENOUS | Status: AC
  Administered 2018-09-26: 100 mg via INTRAVENOUS
  Filled 2018-09-26: qty 10

## 2018-09-26 NOTE — Patient Instructions (Signed)
[facility] Discharge Instructions for Patients Receiving Chemotherapy   Beginning January 23rd 2017 lab work for the Cancer Center will be done in the  Main lab at [place] on 1st floor. If you have a lab appointment with the Cancer Center please come in thru the  Main Entrance and check in at the main information desk   Today you received the following chemotherapy agents Vidaza. Follow up as scheduled. Call clinic for any questions or concerns.   To help prevent nausea and vomiting after your treatment, we encourage you to take your nausea medication   If you develop nausea and vomiting, or diarrhea that is not controlled by your medication, call the clinic.  The clinic phone number is [phone]. Office hours are Monday-Friday 8:30am-5:00pm.  BELOW ARE SYMPTOMS THAT SHOULD BE REPORTED IMMEDIATELY:  *FEVER GREATER THAN 101.0 F  *CHILLS WITH OR WITHOUT FEVER  NAUSEA AND VOMITING THAT IS NOT CONTROLLED WITH YOUR NAUSEA MEDICATION  *UNUSUAL SHORTNESS OF BREATH  *UNUSUAL BRUISING OR BLEEDING  TENDERNESS IN MOUTH AND THROAT WITH OR WITHOUT PRESENCE OF ULCERS  *URINARY PROBLEMS  *BOWEL PROBLEMS  UNUSUAL RASH Items with * indicate a potential emergency and should be followed up as soon as possible. If you have an emergency after office hours please contact your primary care physician or go to the nearest emergency department.  Please call the clinic during office hours if you have any questions or concerns.   You may also contact the Patient Navigator at [phone] should you have any questions or need assistance in obtaining follow up care.      Resources For Cancer Patients and their Caregivers ? American Cancer Society: Can assist with transportation, wigs, general needs, runs Look Good Feel Better.        1-888-227-6333 ? Cancer Care: Provides financial assistance, online support groups, medication/co-pay assistance.  1-800-813-HOPE  (4673) ? [facility] Assists [place] cancer patients and their families through emotional , educational and financial support.  336-427-4357 ? [place] DSS Where to apply for food stamps, Medicaid and utility assistance. 336-342-1394 ? [facility]: Transportation to medical appointments. 336-347-2287 ? Social Security Administration: May apply for disability if have a Stage IV cancer. 336-342-7796 1-800-772-1213 ? [place] Aging, Disability and Transit Services: Assists with nutrition, care and transit needs. 336-349-2343          

## 2018-09-26 NOTE — Progress Notes (Signed)
Richard Wallace tolerated Vidaza infusion well without complaints or incident. Port left accessed and flushed per protocol for use tomorrow. VSS upon discharge. Pt discharged self ambulatory in satisfactory condition

## 2018-09-27 ENCOUNTER — Inpatient Hospital Stay (HOSPITAL_COMMUNITY): Payer: Medicare Other

## 2018-09-27 ENCOUNTER — Encounter (HOSPITAL_COMMUNITY): Payer: Self-pay

## 2018-09-27 ENCOUNTER — Other Ambulatory Visit: Payer: Self-pay

## 2018-09-27 VITALS — BP 128/60 | HR 78 | Temp 98.2°F | Resp 17

## 2018-09-27 DIAGNOSIS — D469 Myelodysplastic syndrome, unspecified: Secondary | ICD-10-CM | POA: Diagnosis not present

## 2018-09-27 DIAGNOSIS — D46Z Other myelodysplastic syndromes: Secondary | ICD-10-CM

## 2018-09-27 DIAGNOSIS — Z95828 Presence of other vascular implants and grafts: Secondary | ICD-10-CM

## 2018-09-27 MED ORDER — SODIUM CHLORIDE 0.9% FLUSH
10.0000 mL | INTRAVENOUS | Status: DC | PRN
  Administered 2018-09-27 (×2): 10 mL
  Filled 2018-09-27 (×2): qty 10

## 2018-09-27 MED ORDER — SODIUM CHLORIDE 0.9 % IV SOLN
Freq: Once | INTRAVENOUS | Status: AC
  Administered 2018-09-27: 11:00:00 via INTRAVENOUS

## 2018-09-27 MED ORDER — PALONOSETRON HCL INJECTION 0.25 MG/5ML
0.2500 mg | Freq: Once | INTRAVENOUS | Status: AC
  Administered 2018-09-27: 0.25 mg via INTRAVENOUS

## 2018-09-27 MED ORDER — SODIUM CHLORIDE 0.9 % IV SOLN
100.0000 mg | Freq: Once | INTRAVENOUS | Status: AC
  Administered 2018-09-27: 100 mg via INTRAVENOUS
  Filled 2018-09-27: qty 10

## 2018-09-27 MED ORDER — PALONOSETRON HCL INJECTION 0.25 MG/5ML
INTRAVENOUS | Status: AC
  Filled 2018-09-27: qty 5

## 2018-09-27 MED ORDER — HEPARIN SOD (PORK) LOCK FLUSH 100 UNIT/ML IV SOLN
500.0000 [IU] | Freq: Once | INTRAVENOUS | Status: AC | PRN
  Administered 2018-09-27: 500 [IU]

## 2018-09-27 NOTE — Progress Notes (Signed)
Treatment given today per MD orders. Tolerated infusion without adverse affects. Vital signs stable. No complaints at this time. Discharged from clinic ambulatory. F/U with [facility] as scheduled.   

## 2018-09-27 NOTE — Patient Instructions (Signed)
[facility] Discharge Instructions for Patients Receiving Chemotherapy  Today you received the following chemotherapy agents   To help prevent nausea and vomiting after your treatment, we encourage you to take your nausea medication   If you develop nausea and vomiting that is not controlled by your nausea medication, call the clinic.   BELOW ARE SYMPTOMS THAT SHOULD BE REPORTED IMMEDIATELY:  *FEVER GREATER THAN 100.5 F  *CHILLS WITH OR WITHOUT FEVER  NAUSEA AND VOMITING THAT IS NOT CONTROLLED WITH YOUR NAUSEA MEDICATION  *UNUSUAL SHORTNESS OF BREATH  *UNUSUAL BRUISING OR BLEEDING  TENDERNESS IN MOUTH AND THROAT WITH OR WITHOUT PRESENCE OF ULCERS  *URINARY PROBLEMS  *BOWEL PROBLEMS  UNUSUAL RASH Items with * indicate a potential emergency and should be followed up as soon as possible.  Feel free to call the clinic should you have any questions or concerns. The clinic phone number is [phone].  Please show the CHEMO ALERT CARD at check-in to the Emergency Department and triage nurse.   

## 2018-10-01 ENCOUNTER — Encounter (HOSPITAL_COMMUNITY): Payer: Self-pay

## 2018-10-01 ENCOUNTER — Inpatient Hospital Stay (HOSPITAL_COMMUNITY): Payer: Medicare Other

## 2018-10-01 ENCOUNTER — Other Ambulatory Visit: Payer: Self-pay

## 2018-10-01 VITALS — BP 136/77 | HR 69 | Temp 98.2°F | Resp 18 | Wt 231.6 lb

## 2018-10-01 DIAGNOSIS — D46Z Other myelodysplastic syndromes: Secondary | ICD-10-CM

## 2018-10-01 DIAGNOSIS — Z95828 Presence of other vascular implants and grafts: Secondary | ICD-10-CM

## 2018-10-01 DIAGNOSIS — D61818 Other pancytopenia: Secondary | ICD-10-CM

## 2018-10-01 DIAGNOSIS — D469 Myelodysplastic syndrome, unspecified: Secondary | ICD-10-CM | POA: Diagnosis not present

## 2018-10-01 LAB — CBC WITH DIFFERENTIAL/PLATELET
Abs Immature Granulocytes: 0 10*3/uL (ref 0.00–0.07)
Basophils Absolute: 0 10*3/uL (ref 0.0–0.1)
Basophils Relative: 0 %
Eosinophils Absolute: 0 10*3/uL (ref 0.0–0.5)
Eosinophils Relative: 0 %
HCT: 21.9 % — ABNORMAL LOW (ref 39.0–52.0)
Hemoglobin: 6.5 g/dL — CL (ref 13.0–17.0)
Immature Granulocytes: 0 %
Lymphocytes Relative: 73 %
Lymphs Abs: 0.6 10*3/uL — ABNORMAL LOW (ref 0.7–4.0)
MCH: 33.3 pg (ref 26.0–34.0)
MCHC: 29.7 g/dL — ABNORMAL LOW (ref 30.0–36.0)
MCV: 112.3 fL — ABNORMAL HIGH (ref 80.0–100.0)
Monocytes Absolute: 0 10*3/uL — ABNORMAL LOW (ref 0.1–1.0)
Monocytes Relative: 5 %
Neutro Abs: 0.2 10*3/uL — ABNORMAL LOW (ref 1.7–7.7)
Neutrophils Relative %: 22 %
Platelets: 61 10*3/uL — ABNORMAL LOW (ref 150–400)
RBC: 1.95 MIL/uL — ABNORMAL LOW (ref 4.22–5.81)
RDW: 19.3 % — ABNORMAL HIGH (ref 11.5–15.5)
WBC: 0.8 10*3/uL — CL (ref 4.0–10.5)
nRBC: 2.5 % — ABNORMAL HIGH (ref 0.0–0.2)

## 2018-10-01 LAB — PREPARE RBC (CROSSMATCH)

## 2018-10-01 LAB — COMPREHENSIVE METABOLIC PANEL
ALT: 26 U/L (ref 0–44)
AST: 29 U/L (ref 15–41)
Albumin: 3.6 g/dL (ref 3.5–5.0)
Alkaline Phosphatase: 48 U/L (ref 38–126)
Anion gap: 9 (ref 5–15)
BUN: 31 mg/dL — ABNORMAL HIGH (ref 8–23)
CO2: 23 mmol/L (ref 22–32)
Calcium: 8.9 mg/dL (ref 8.9–10.3)
Chloride: 110 mmol/L (ref 98–111)
Creatinine, Ser: 1.39 mg/dL — ABNORMAL HIGH (ref 0.61–1.24)
GFR calc Af Amer: 58 mL/min — ABNORMAL LOW (ref >60)
GFR calc non Af Amer: 50 mL/min — ABNORMAL LOW (ref >60)
Glucose, Bld: 123 mg/dL — ABNORMAL HIGH (ref 70–99)
Potassium: 4.6 mmol/L (ref 3.5–5.1)
Sodium: 142 mmol/L (ref 135–145)
Total Bilirubin: 1.2 mg/dL (ref 0.3–1.2)
Total Protein: 6 g/dL — ABNORMAL LOW (ref 6.5–8.1)

## 2018-10-01 MED ORDER — SODIUM CHLORIDE 0.9% IV SOLUTION
250.0000 mL | Freq: Once | INTRAVENOUS | Status: AC
  Administered 2018-10-01: 12:00:00 250 mL via INTRAVENOUS

## 2018-10-01 MED ORDER — SODIUM CHLORIDE 0.9 % IV SOLN
100.0000 mg | Freq: Once | INTRAVENOUS | Status: AC
  Administered 2018-10-01: 100 mg via INTRAVENOUS
  Filled 2018-10-01: qty 10

## 2018-10-01 MED ORDER — SODIUM CHLORIDE 0.9 % IV SOLN
Freq: Once | INTRAVENOUS | Status: AC
  Administered 2018-10-01: 14:00:00 via INTRAVENOUS

## 2018-10-01 MED ORDER — DIPHENHYDRAMINE HCL 25 MG PO CAPS
25.0000 mg | ORAL_CAPSULE | Freq: Once | ORAL | Status: AC
  Administered 2018-10-01: 25 mg via ORAL
  Filled 2018-10-01: qty 1

## 2018-10-01 MED ORDER — HEPARIN SOD (PORK) LOCK FLUSH 100 UNIT/ML IV SOLN
500.0000 [IU] | Freq: Once | INTRAVENOUS | Status: AC | PRN
  Administered 2018-10-01: 500 [IU]

## 2018-10-01 MED ORDER — SODIUM CHLORIDE 0.9% FLUSH
10.0000 mL | INTRAVENOUS | Status: DC | PRN
  Administered 2018-10-01: 10 mL
  Filled 2018-10-01: qty 10

## 2018-10-01 MED ORDER — PALONOSETRON HCL INJECTION 0.25 MG/5ML
0.2500 mg | Freq: Once | INTRAVENOUS | Status: AC
  Administered 2018-10-01: 0.25 mg via INTRAVENOUS
  Filled 2018-10-01: qty 5

## 2018-10-01 MED ORDER — ACETAMINOPHEN 325 MG PO TABS
650.0000 mg | ORAL_TABLET | Freq: Once | ORAL | Status: AC
  Administered 2018-10-01: 650 mg via ORAL
  Filled 2018-10-01: qty 2

## 2018-10-01 NOTE — Progress Notes (Signed)
Labs reviewed with MD today. Proceed today with day 6 of vidaza. MD aware of ANC 0.2, hemoglobin 6.5. will give one unit of blood as well.    One unit of blood given today per orders.   Treatment given per orders. Patient tolerated it well without problems. Vitals stable and discharged home from clinic ambulatory. Follow up as scheduled.

## 2018-10-01 NOTE — Progress Notes (Signed)
CRITICAL VALUE ALERT  Critical Value:  WBC 0.8; Hgb 6.5  Date & Time Notied:  10/01/2018 at 1014  Provider Notified: Dr. Delton Coombes  Orders Received/Actions taken: proceed with Tx today; transfuse 1 unit PRBC

## 2018-10-01 NOTE — Patient Instructions (Signed)

## 2018-10-02 ENCOUNTER — Inpatient Hospital Stay (HOSPITAL_COMMUNITY): Payer: Medicare Other

## 2018-10-02 VITALS — BP 135/70 | HR 72 | Temp 98.2°F | Resp 18

## 2018-10-02 DIAGNOSIS — Z95828 Presence of other vascular implants and grafts: Secondary | ICD-10-CM

## 2018-10-02 DIAGNOSIS — D469 Myelodysplastic syndrome, unspecified: Secondary | ICD-10-CM | POA: Diagnosis not present

## 2018-10-02 DIAGNOSIS — D46Z Other myelodysplastic syndromes: Secondary | ICD-10-CM

## 2018-10-02 LAB — BPAM RBC
Blood Product Expiration Date: 202006242359
ISSUE DATE / TIME: 202005261140
Unit Type and Rh: 5100

## 2018-10-02 LAB — TYPE AND SCREEN
ABO/RH(D): O POS
Antibody Screen: NEGATIVE
Unit division: 0

## 2018-10-02 MED ORDER — SODIUM CHLORIDE 0.9 % IV SOLN
100.0000 mg | Freq: Once | INTRAVENOUS | Status: AC
  Administered 2018-10-02: 100 mg via INTRAVENOUS
  Filled 2018-10-02: qty 10

## 2018-10-02 MED ORDER — SODIUM CHLORIDE 0.9 % IV SOLN
Freq: Once | INTRAVENOUS | Status: AC
  Administered 2018-10-02: 11:00:00 via INTRAVENOUS

## 2018-10-02 MED ORDER — HEPARIN SOD (PORK) LOCK FLUSH 100 UNIT/ML IV SOLN
500.0000 [IU] | Freq: Once | INTRAVENOUS | Status: AC | PRN
  Administered 2018-10-02: 500 [IU]

## 2018-10-02 MED ORDER — SODIUM CHLORIDE 0.9% FLUSH
10.0000 mL | INTRAVENOUS | Status: DC | PRN
  Administered 2018-10-02: 10 mL
  Filled 2018-10-02: qty 10

## 2018-10-02 MED ORDER — PALONOSETRON HCL INJECTION 0.25 MG/5ML
0.2500 mg | Freq: Once | INTRAVENOUS | Status: AC
  Administered 2018-10-02: 0.25 mg via INTRAVENOUS

## 2018-10-02 MED ORDER — PALONOSETRON HCL INJECTION 0.25 MG/5ML
INTRAVENOUS | Status: AC
  Filled 2018-10-02: qty 5

## 2018-10-02 NOTE — Patient Instructions (Signed)
[facility] Discharge Instructions for Patients Receiving Chemotherapy  Today you received the following chemotherapy agents   To help prevent nausea and vomiting after your treatment, we encourage you to take your nausea medication   If you develop nausea and vomiting that is not controlled by your nausea medication, call the clinic.   BELOW ARE SYMPTOMS THAT SHOULD BE REPORTED IMMEDIATELY:  *FEVER GREATER THAN 100.5 F  *CHILLS WITH OR WITHOUT FEVER  NAUSEA AND VOMITING THAT IS NOT CONTROLLED WITH YOUR NAUSEA MEDICATION  *UNUSUAL SHORTNESS OF BREATH  *UNUSUAL BRUISING OR BLEEDING  TENDERNESS IN MOUTH AND THROAT WITH OR WITHOUT PRESENCE OF ULCERS  *URINARY PROBLEMS  *BOWEL PROBLEMS  UNUSUAL RASH Items with * indicate a potential emergency and should be followed up as soon as possible.  Feel free to call the clinic should you have any questions or concerns. The clinic phone number is [phone].  Please show the CHEMO ALERT CARD at check-in to the Emergency Department and triage nurse.   

## 2018-10-02 NOTE — Progress Notes (Signed)
Treatment given per orders. Patient tolerated it well without problems. Vitals stable and discharged home from clinic ambulatory. Follow up as scheduled.  

## 2018-10-08 ENCOUNTER — Other Ambulatory Visit: Payer: Self-pay

## 2018-10-08 ENCOUNTER — Encounter (HOSPITAL_COMMUNITY): Payer: Self-pay | Admitting: Hematology

## 2018-10-08 ENCOUNTER — Inpatient Hospital Stay (HOSPITAL_COMMUNITY): Payer: Medicare Other

## 2018-10-08 ENCOUNTER — Other Ambulatory Visit (HOSPITAL_COMMUNITY): Payer: Self-pay | Admitting: *Deleted

## 2018-10-08 ENCOUNTER — Inpatient Hospital Stay (HOSPITAL_COMMUNITY): Payer: Medicare Other | Attending: Hematology | Admitting: Hematology

## 2018-10-08 VITALS — BP 120/59 | HR 92 | Temp 98.4°F | Resp 18 | Wt 235.5 lb

## 2018-10-08 DIAGNOSIS — D4622 Refractory anemia with excess of blasts 2: Secondary | ICD-10-CM | POA: Diagnosis present

## 2018-10-08 DIAGNOSIS — Z5111 Encounter for antineoplastic chemotherapy: Secondary | ICD-10-CM | POA: Diagnosis not present

## 2018-10-08 DIAGNOSIS — D46Z Other myelodysplastic syndromes: Secondary | ICD-10-CM

## 2018-10-08 DIAGNOSIS — D61818 Other pancytopenia: Secondary | ICD-10-CM

## 2018-10-08 DIAGNOSIS — N189 Chronic kidney disease, unspecified: Secondary | ICD-10-CM

## 2018-10-08 LAB — COMPREHENSIVE METABOLIC PANEL
ALT: 27 U/L (ref 0–44)
AST: 28 U/L (ref 15–41)
Albumin: 3.7 g/dL (ref 3.5–5.0)
Alkaline Phosphatase: 48 U/L (ref 38–126)
Anion gap: 9 (ref 5–15)
BUN: 22 mg/dL (ref 8–23)
CO2: 24 mmol/L (ref 22–32)
Calcium: 9.1 mg/dL (ref 8.9–10.3)
Chloride: 109 mmol/L (ref 98–111)
Creatinine, Ser: 1.38 mg/dL — ABNORMAL HIGH (ref 0.61–1.24)
GFR calc Af Amer: 58 mL/min — ABNORMAL LOW (ref >60)
GFR calc non Af Amer: 50 mL/min — ABNORMAL LOW (ref >60)
Glucose, Bld: 121 mg/dL — ABNORMAL HIGH (ref 70–99)
Potassium: 4.9 mmol/L (ref 3.5–5.1)
Sodium: 142 mmol/L (ref 135–145)
Total Bilirubin: 1 mg/dL (ref 0.3–1.2)
Total Protein: 6.2 g/dL — ABNORMAL LOW (ref 6.5–8.1)

## 2018-10-08 LAB — CBC WITH DIFFERENTIAL/PLATELET
Abs Immature Granulocytes: 0.01 10*3/uL (ref 0.00–0.07)
Basophils Absolute: 0 10*3/uL (ref 0.0–0.1)
Basophils Relative: 0 %
Eosinophils Absolute: 0 10*3/uL (ref 0.0–0.5)
Eosinophils Relative: 0 %
HCT: 29.6 % — ABNORMAL LOW (ref 39.0–52.0)
Hemoglobin: 8.2 g/dL — ABNORMAL LOW (ref 13.0–17.0)
Immature Granulocytes: 1 %
Lymphocytes Relative: 56 %
Lymphs Abs: 1 10*3/uL (ref 0.7–4.0)
MCH: 31.5 pg (ref 26.0–34.0)
MCHC: 27.7 g/dL — ABNORMAL LOW (ref 30.0–36.0)
MCV: 113.8 fL — ABNORMAL HIGH (ref 80.0–100.0)
Monocytes Absolute: 0.2 10*3/uL (ref 0.1–1.0)
Monocytes Relative: 12 %
Neutro Abs: 0.6 10*3/uL — ABNORMAL LOW (ref 1.7–7.7)
Neutrophils Relative %: 31 %
Platelets: 32 10*3/uL — ABNORMAL LOW (ref 150–400)
RBC: 2.6 MIL/uL — ABNORMAL LOW (ref 4.22–5.81)
RDW: 21.3 % — ABNORMAL HIGH (ref 11.5–15.5)
WBC: 1.8 10*3/uL — ABNORMAL LOW (ref 4.0–10.5)
nRBC: 6.1 % — ABNORMAL HIGH (ref 0.0–0.2)

## 2018-10-08 NOTE — Progress Notes (Signed)
Richard Wallace, [place] [address]   CLINIC:  Medical Oncology/Hematology  PCP:  Richard Sos, MD [address] [place] [address] [phone]   REASON FOR VISIT:  Follow-up for MDS   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[place])   08/14/2018 Initial Diagnosis    MDS (myelodysplastic syndrome), high grade ([place])    08/22/2018 -  Chemotherapy    The patient had palonosetron (ALOXI) injection 0.25 mg, 0.25 mg, Intravenous,  Once, 2 of 4 cycles Administration: 0.25 mg (08/22/2018), 0.25 mg (08/26/2018), 0.25 mg (08/28/2018), 0.25 mg (08/30/2018), 0.25 mg (10/01/2018), 0.25 mg (10/02/2018), 0.25 mg (09/23/2018), 0.25 mg (09/25/2018), 0.25 mg (09/27/2018) azaCITIDine (VIDAZA) 100 mg in sodium chloride 0.9 % 50 mL chemo infusion, 110 mg (66.7 % of original dose 75 mg/m2), Intravenous, Once, 2 of 4 cycles Dose modification: 50 mg/m2 (original dose 75 mg/m2, Cycle 1, Reason: Provider Judgment), 50 mg/m2 (original dose 75 mg/m2, Cycle 2, Reason: Provider Judgment) Administration: 100 mg (08/22/2018), 100 mg (08/23/2018), 100 mg (08/26/2018), 100 mg (08/27/2018), 100 mg (08/28/2018), 100 mg (08/29/2018), 100 mg (08/30/2018), 100 mg (10/01/2018), 100 mg (10/02/2018), 100 mg (09/23/2018), 100 mg (09/24/2018), 100 mg (09/25/2018), 100 mg (09/26/2018), 100 mg (09/27/2018)  for chemotherapy treatment.       CANCER STAGING: Cancer Staging No matching staging information was found for the patient.   INTERVAL HISTORY:  [name] 73 y.o. male returns for routine follow-up. He is here today alone with his wife present via cell phone. He states that he has been doing better since his last treatment. He states that the dizziness is better as well.  His cycle 2 of treatment was on 09/23/2018.  He did have some constipation which was controlled with prune juice and milk of magnesia.  Denies any nausea, vomiting, or diarrhea. Denies any new pains. Had not  noticed any recent bleeding such as epistaxis, hematuria or hematochezia. Denies recent chest pain on exertion, shortness of breath on minimal exertion, pre-syncopal episodes, or palpitations. Denies any numbness or tingling in hands or feet. Denies any recent fevers, infections, or recent hospitalizations. Patient reports appetite at 100% and energy level at 25%.   REVIEW OF SYSTEMS:  Review of Systems  Constitutional: Positive for fatigue.  Gastrointestinal: Positive for constipation.  Neurological: Positive for dizziness.  All other systems reviewed and are negative.    PAST MEDICAL/SURGICAL HISTORY:  Past Medical History:  Diagnosis Date  . Arthritis   . 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 Cancer ([place]) 01-28-2014   skin cancer  . CKD (chronic kidney disease), stage III ([place])   . GERD (gastroesophageal reflux disease)   . Heart murmur    NOTED DURING PHYSICAL WHEN HE WAS ENLISTING IN [place] , DIDNT KNOW UNTIL THAT TIME AND REPORTS , "THATS THE LAST I HEARD ABOUT IT "   . History of hypertension    no longer issue  . History of kidney stones   . History of malignant melanoma of skin    excision top of scalp 2015-- no recurrence  . History of urinary retention    post op lumbar fusion surgery 04/ 2016  . Hypertension   . 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 Left ureteral calculus   . Seasonal allergies   . Wears glasses   . Wears glasses   .  Wears partial dentures    upper and lower   Past Surgical History:  Procedure Laterality Date  . ANKLE FUSION Right 2007  . CARPAL TUNNEL RELEASE Left 12/28/2009   w/ pulley release left long finger  . CARPAL TUNNEL RELEASE Right 07/22/2013   Procedure: RIGHT CARPAL TUNNEL RELEASE;  Surgeon: Cammie Sickle., MD;  Location: [facility];  Service: Orthopedics;  Laterality: Right;  . COLONOSCOPY    . CYSTO/  LEFT RETROGRADE  PYELOGRAM  11/21/2010  . CYSTOSCOPY WITH STENT PLACEMENT Left 03/09/2016   Procedure: CYSTOSCOPY WITH STENT PLACEMENT;  Surgeon: Franchot Gallo, MD;  Location[facility];  Service: Urology;  Laterality: Left;  . CYSTOSCOPY/RETROGRADE/URETEROSCOPY/STONE EXTRACTION WITH BASKET Left 03/09/2016   Procedure: CYSTOSCOPY/RETROGRADE/URETEROSCOPY/STONE EXTRACTION WITH BASKET;  Surgeon: Franchot Gallo, MD;  Location: [facility];  Service: Urology;  Laterality: Left;  . LEFT URETEROSCOPIC LASER LITHOTRIPSY STONE EXTRACTION/ STENT PLACEMENT  05/23/2010  . MOHS SURGERY     TOP OF THE HEAD   . ORIF ANKLE FRACTURE Right 1978  . PORTACATH PLACEMENT Right 08/19/2018   Procedure: INSERTION PORT-A-CATH (attached catheter in right subclavian);  Surgeon: Aviva Signs, MD;  Location: [facility];  Service: General;  Laterality: Right;  . POSTERIOR LUMBAR FUSION  08/21/2014   laminectomy and decompression L2 -- L5  . RIGHT LOWER LEG SURGERY  X3  1975 to 1976   including ORIF  . TONSILLECTOMY AND ADENOIDECTOMY  1986  . UMBILICAL HERNIA REPAIR  2009 approx     SOCIAL HISTORY:  Social History   Socioeconomic History  . Marital status: Married    Spouse name: Not on file  . Number of children: Not on file  . Years of education: Not on file  . Highest education level: Not on file  Occupational History  . Not on file  Social Needs  . Financial resource strain: Not on file  . Food insecurity:    Worry: Not on file    Inability: Not on file  . Transportation needs:    Medical: Not on file    Non-medical: Not on file  Tobacco Use  . Smoking status: Former Smoker    Years: 20.00    Types: Cigarettes    Last attempt to quit: 07/17/1986    Years since quitting: 32.2  . Smokeless tobacco: Never Used  Substance and Sexual Activity  . Alcohol use: Yes    Alcohol/week: 7.0 - 14.0 standard drinks    Types: 7 - 14 Cans of beer per week    Comment: 1 -2 beer daily  . Drug use:  No  . Sexual activity: Not on file  Lifestyle  . Physical activity:    Days per week: Not on file    Minutes per session: Not on file  . Stress: Not on file  Relationships  . Social connections:    Talks on phone: Not on file    Gets together: Not on file    Attends religious service: Not on file    Active member of club or organization: Not on file    Attends meetings of clubs or organizations: Not on file    Relationship status: Not on file  . Intimate partner violence:    Fear of current or ex partner: Not on file    Emotionally abused: Not on file    Physically abused: Not on file    Forced sexual activity: Not on file  Other Topics Concern  . Not  on file  Social History Narrative  . Not on file    FAMILY HISTORY:  Family History  Problem Relation Age of Onset  . Stroke Mother   . Prostate cancer Father   . Bone cancer Father   . Rheum arthritis Sister   . Urinary tract infection Sister     CURRENT MEDICATIONS:  Outpatient Encounter Medications as of 10/08/2018  Medication Sig  . acetaminophen (TYLENOL) 500 MG tablet Take 1,000 mg by mouth every 6 (six) hours as needed for mild pain or moderate pain.   Marland Kitchen allopurinol (ZYLOPRIM) 300 MG tablet Take 300 mg by mouth every evening.  Marland Kitchen azaCITIDine 5 mg/2 mLs in lactated ringers infusion Inject into the vein daily. Day 1-7 every 28 days (starting 08/22/2018)  . celecoxib (CELEBREX) 200 MG capsule Take 200 mg by mouth daily. IN THE MORNING  . cetirizine (ZYRTEC) 10 MG tablet Take 10 mg by mouth daily. IN THE MORNING  . diclofenac sodium (VOLTAREN) 1 % GEL Apply 1 g topically 3 (three) times daily as needed (knee pain.).   Marland Kitchen docusate sodium (COLACE) 100 MG capsule Take 100 mg by mouth at bedtime.   . fluticasone (FLONASE) 50 MCG/ACT nasal spray Place 1 spray into both nostrils daily. In the morning  . HYDROcodone-acetaminophen (NORCO) 5-325 MG tablet Take 1 tablet by mouth every 4 (four) hours as needed for moderate pain.  Marland Kitchen  lansoprazole (PREVACID) 15 MG capsule Take 15 mg by mouth daily before breakfast.   . lidocaine-prilocaine (EMLA) cream Apply pea-sized amount to port a cath site and cover with plastic wrap one hour prior to chemotherapy appointment  . lisinopril (PRINIVIL,ZESTRIL) 5 MG tablet Take 2.5 mg by mouth daily. In the morning  . magnesium oxide (MAG-OX) 400 MG tablet Take 400 mg by mouth at bedtime.   Vladimir Faster Glycol-Propyl Glycol (SYSTANE OP) Place 1 drop into both eyes 2 (two) times daily.  . prochlorperazine (COMPAZINE) 10 MG tablet Take 1 tablet (10 mg total) by mouth every 6 (six) hours as needed (Nausea or vomiting).  . tamsulosin (FLOMAX) 0.4 MG CAPS capsule Take 0.4 mg by mouth every evening.   . traMADol (ULTRAM) 50 MG tablet Take 50 mg by mouth 3 (three) times daily as needed (pain.).    No facility-administered encounter medications on file as of 10/08/2018.     ALLERGIES:  No Known Allergies   PHYSICAL EXAM:  ECOG Performance status: 1  Vitals:   10/08/18 1112  BP: (!) 120/59  Pulse: 92  Resp: 18  Temp: 98.4 F (36.9 C)  SpO2: 98%   Filed Weights   10/08/18 1112  Weight: 235 lb 8 oz (106.8 kg)    Physical Exam Vitals signs reviewed.  Constitutional:      Appearance: Normal appearance.  Cardiovascular:     Rate and Rhythm: Normal rate and regular rhythm.     Heart sounds: Normal heart sounds.  Pulmonary:     Effort: Pulmonary effort is normal.     Breath sounds: Normal breath sounds.  Abdominal:     General: There is no distension.     Palpations: Abdomen is soft. There is no mass.  Musculoskeletal:        General: No swelling.  Skin:    General: Skin is warm.  Neurological:     General: No focal deficit present.     Mental Status: He is alert and oriented to person, place, and time.  Psychiatric:  Mood and Affect: Mood normal.        Behavior: Behavior normal.      LABORATORY DATA:  I have reviewed the labs as listed.  CBC    Component  Value Date/Time   WBC 1.8 (L) 10/08/2018 1054   RBC 2.60 (L) 10/08/2018 1054   HGB 8.2 (L) 10/08/2018 1054   HCT 29.6 (L) 10/08/2018 1054   PLT 32 (L) 10/08/2018 1054   MCV 113.8 (H) 10/08/2018 1054   MCH 31.5 10/08/2018 1054   MCHC 27.7 (L) 10/08/2018 1054   RDW 21.3 (H) 10/08/2018 1054   LYMPHSABS 1.0 10/08/2018 1054   MONOABS 0.2 10/08/2018 1054   EOSABS 0.0 10/08/2018 1054   BASOSABS 0.0 10/08/2018 1054   CMP Latest Ref Rng & Units 10/08/2018 10/01/2018 09/23/2018  Glucose 70 - 99 mg/dL 121(H) 123(H) 136(H)  BUN 8 - 23 mg/dL 22 31(H) 34(H)  Creatinine 0.61 - 1.24 mg/dL 1.38(H) 1.39(H) 1.49(H)  Sodium 135 - 145 mmol/L 142 142 141  Potassium 3.5 - 5.1 mmol/L 4.9 4.6 4.3  Chloride 98 - 111 mmol/L 109 110 110  CO2 22 - 32 mmol/L '24 23 22  ' Calcium 8.9 - 10.3 mg/dL 9.1 8.9 9.4  Total Protein 6.5 - 8.1 g/dL 6.2(L) 6.0(L) 6.4(L)  Total Bilirubin 0.3 - 1.2 mg/dL 1.0 1.2 1.4(H)  Alkaline Phos 38 - 126 U/L 48 48 52  AST 15 - 41 U/L '28 29 30  ' ALT 0 - 44 U/L '27 26 29       ' DIAGNOSTIC IMAGING:  I have independently reviewed the scans and discussed with the patient.   I have reviewed Venita Lick LPN's note and agree with the documentation.  I personally performed a face-to-face visit, made revisions and my assessment and plan is as follows.    ASSESSMENT & PLAN:   MDS (myelodysplastic syndrome), high grade ([place] Level) 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  Nutritional work-up was negative. 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  Flow cytometry shows 12% blasts. 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  FISH panel is pending. 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 - Vidaza (50 mg/m2) cycle 1 on 08/22/2018. -He received 1 unit of platelets on  09/09/2018. - Cycle 2 of Vidaza (50 mg per metered square) on 09/23/2018. - 1 unit PRBC on 10/01/2018 for hemoglobin of 6.5. - Today he felt better.  Hemoglobin is 8.2.  White count improved to 1.8.  Platelets of 32.  No bleeding episodes.  Dizziness is also better. -We will continue to monitor his blood counts on a weekly basis and see if any blood transfusion is needed. -I will see him back on 10/21/2018 and start his cycle 3.  I have also called his wife and talk to her and explained her the plan.  2.  CKD: - Creatinine is stable around 1.4.    Total time spent is 25 minutes with more than 50% of time spent face-to-face discussing treatment plan and coordination of care.    Orders placed this encounter:  Orders Placed This Encounter  Procedures  . CBC with Differential  . Sample to Blood Bank  . Type and screen      [name], MD [place] [phone]

## 2018-10-08 NOTE — Assessment & Plan Note (Signed)
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  Nutritional work-up was negative. 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  Flow cytometry shows 12% blasts. 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  FISH panel is pending. 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 - Vidaza (50 mg/m2) cycle 1 on 08/22/2018. -He received 1 unit of platelets on 09/09/2018. - Cycle 2 of Vidaza (50 mg per metered square) on 09/23/2018. - 1 unit PRBC on 10/01/2018 for hemoglobin of 6.5. - Today he felt better.  Hemoglobin is 8.2.  White count improved to 1.8.  Platelets of 32.  No bleeding episodes.  Dizziness is also better. -We will continue to monitor his blood counts on a weekly basis and see if any blood transfusion is needed. -I will see him back on 10/21/2018 and start his cycle 3.  I have also called his wife and talk to her and explained her the plan.  2.  CKD: - Creatinine is stable around 1.4.

## 2018-10-08 NOTE — Patient Instructions (Signed)
[place] at [facility] Discharge Instructions  You were seen today by Dr. Delton Coombes. He went over your recent lab results. He will see you back on 10/21/18  for labs and follow up.   Thank you for choosing [place] at [facility] to provide your oncology and hematology care.  To afford each patient quality time with our provider, please arrive at least 15 minutes before your scheduled appointment time.   If you have a lab appointment with the [facility] please come in thru the  Main Entrance and check in at the main information desk  You need to re-schedule your appointment should you arrive 10 or more minutes late.  We strive to give you quality time with our providers, and arriving late affects you and other patients whose appointments are after yours.  Also, if you no show three or more times for appointments you may be dismissed from the clinic at the providers discretion.     Again, thank you for choosing [facility].  Our hope is that these requests will decrease the amount of time that you wait before being seen by our physicians.       _____________________________________________________________  Should you have questions after your visit to [facility], please contact our office at [phone] between the hours of 8:00 a.m. and 4:30 p.m.  Voicemails left after 4:00 p.m. will not be returned until the following business day.  For prescription refill requests, have your pharmacy contact our office and allow 72 hours.    Cancer Center Support Programs:   > Cancer Support Group  2nd Tuesday of the month 1pm-2pm, Journey Room

## 2018-10-21 ENCOUNTER — Encounter (HOSPITAL_COMMUNITY): Payer: Self-pay | Admitting: Hematology

## 2018-10-21 ENCOUNTER — Inpatient Hospital Stay (HOSPITAL_COMMUNITY): Payer: Medicare Other | Attending: Internal Medicine | Admitting: Hematology

## 2018-10-21 ENCOUNTER — Other Ambulatory Visit: Payer: Self-pay

## 2018-10-21 ENCOUNTER — Inpatient Hospital Stay (HOSPITAL_COMMUNITY): Payer: Medicare Other

## 2018-10-21 VITALS — BP 129/64 | HR 92 | Temp 98.6°F | Resp 18 | Wt 231.6 lb

## 2018-10-21 DIAGNOSIS — D46Z Other myelodysplastic syndromes: Secondary | ICD-10-CM

## 2018-10-21 DIAGNOSIS — Z5111 Encounter for antineoplastic chemotherapy: Secondary | ICD-10-CM | POA: Diagnosis not present

## 2018-10-21 DIAGNOSIS — D469 Myelodysplastic syndrome, unspecified: Secondary | ICD-10-CM | POA: Diagnosis not present

## 2018-10-21 DIAGNOSIS — Z79899 Other long term (current) drug therapy: Secondary | ICD-10-CM | POA: Diagnosis not present

## 2018-10-21 DIAGNOSIS — D61818 Other pancytopenia: Secondary | ICD-10-CM | POA: Diagnosis not present

## 2018-10-21 DIAGNOSIS — N189 Chronic kidney disease, unspecified: Secondary | ICD-10-CM

## 2018-10-21 DIAGNOSIS — D4622 Refractory anemia with excess of blasts 2: Secondary | ICD-10-CM | POA: Insufficient documentation

## 2018-10-21 DIAGNOSIS — Z95828 Presence of other vascular implants and grafts: Secondary | ICD-10-CM

## 2018-10-21 LAB — CBC WITH DIFFERENTIAL/PLATELET
Abs Immature Granulocytes: 0 10*3/uL (ref 0.00–0.07)
Basophils Absolute: 0 10*3/uL (ref 0.0–0.1)
Basophils Relative: 0 %
Eosinophils Absolute: 0 10*3/uL (ref 0.0–0.5)
Eosinophils Relative: 0 %
HCT: 28.5 % — ABNORMAL LOW (ref 39.0–52.0)
Hemoglobin: 8.3 g/dL — ABNORMAL LOW (ref 13.0–17.0)
Immature Granulocytes: 0 %
Lymphocytes Relative: 73 %
Lymphs Abs: 0.9 10*3/uL (ref 0.7–4.0)
MCH: 31.7 pg (ref 26.0–34.0)
MCHC: 29.1 g/dL — ABNORMAL LOW (ref 30.0–36.0)
MCV: 108.8 fL — ABNORMAL HIGH (ref 80.0–100.0)
Monocytes Absolute: 0.1 10*3/uL (ref 0.1–1.0)
Monocytes Relative: 7 %
Neutro Abs: 0.2 10*3/uL — ABNORMAL LOW (ref 1.7–7.7)
Neutrophils Relative %: 20 %
Platelets: 56 10*3/uL — ABNORMAL LOW (ref 150–400)
RBC: 2.62 MIL/uL — ABNORMAL LOW (ref 4.22–5.81)
RDW: 18.5 % — ABNORMAL HIGH (ref 11.5–15.5)
WBC: 1.2 10*3/uL — CL (ref 4.0–10.5)
nRBC: 3.3 % — ABNORMAL HIGH (ref 0.0–0.2)

## 2018-10-21 LAB — COMPREHENSIVE METABOLIC PANEL
ALT: 22 U/L (ref 0–44)
AST: 25 U/L (ref 15–41)
Albumin: 3.6 g/dL (ref 3.5–5.0)
Alkaline Phosphatase: 46 U/L (ref 38–126)
Anion gap: 13 (ref 5–15)
BUN: 28 mg/dL — ABNORMAL HIGH (ref 8–23)
CO2: 21 mmol/L — ABNORMAL LOW (ref 22–32)
Calcium: 9.2 mg/dL (ref 8.9–10.3)
Chloride: 109 mmol/L (ref 98–111)
Creatinine, Ser: 1.28 mg/dL — ABNORMAL HIGH (ref 0.61–1.24)
GFR calc Af Amer: >60 mL/min (ref >60)
GFR calc non Af Amer: 55 mL/min — ABNORMAL LOW (ref >60)
Glucose, Bld: 114 mg/dL — ABNORMAL HIGH (ref 70–99)
Potassium: 4.3 mmol/L (ref 3.5–5.1)
Sodium: 143 mmol/L (ref 135–145)
Total Bilirubin: 1.1 mg/dL (ref 0.3–1.2)
Total Protein: 6.3 g/dL — ABNORMAL LOW (ref 6.5–8.1)

## 2018-10-21 LAB — MAGNESIUM: Magnesium: 1.9 mg/dL (ref 1.7–2.4)

## 2018-10-21 MED ORDER — SODIUM CHLORIDE 0.9% FLUSH
10.0000 mL | INTRAVENOUS | Status: DC | PRN
  Administered 2018-10-21: 10 mL
  Filled 2018-10-21: qty 10

## 2018-10-21 MED ORDER — HEPARIN SOD (PORK) LOCK FLUSH 100 UNIT/ML IV SOLN
500.0000 [IU] | Freq: Once | INTRAVENOUS | Status: AC | PRN
  Administered 2018-10-21: 11:00:00 500 [IU]

## 2018-10-21 NOTE — Progress Notes (Signed)
Richard Wallace, [place] [id number]   CLINIC:  Medical Oncology/Hematology  PCP:  [name], MD [address] 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[place])   08/22/2018 -  Chemotherapy   The patient had palonosetron (ALOXI) injection 0.25 mg, 0.25 mg, Intravenous,  Once, 3 of 4 cycles Administration: 0.25 mg (08/22/2018), 0.25 mg (08/26/2018), 0.25 mg (08/28/2018), 0.25 mg (08/30/2018), 0.25 mg (10/01/2018), 0.25 mg (10/02/2018), 0.25 mg (09/23/2018), 0.25 mg (09/25/2018), 0.25 mg (09/27/2018) azaCITIDine (VIDAZA) 100 mg in sodium chloride 0.9 % 50 mL chemo infusion, 110 mg (66.7 % of original dose 75 mg/m2), Intravenous, Once, 3 of 4 cycles Dose modification: 50 mg/m2 (original dose 75 mg/m2, Cycle 1, Reason: Provider Judgment), 50 mg/m2 (original dose 75 mg/m2, Cycle 2, Reason: Provider Judgment) Administration: 100 mg (08/22/2018), 100 mg (08/23/2018), 100 mg (08/26/2018), 100 mg (08/27/2018), 100 mg (08/28/2018), 100 mg (08/29/2018), 100 mg (08/30/2018), 100 mg (10/01/2018), 100 mg (10/02/2018), 100 mg (09/23/2018), 100 mg (09/24/2018), 100 mg (09/25/2018), 100 mg (09/26/2018), 100 mg (09/27/2018)  for chemotherapy treatment.       CANCER STAGING: Cancer Staging No matching staging information was found for the patient.   INTERVAL HISTORY:  Richard Wallace 73 y.o. male seen for follow-up and cycle 3 of chemotherapy.  His cycle 3 of chemotherapy was on 09/23/2018.  He required 1 unit of PRBC on 10/01/2018.  Last 2 weeks have been better.  He has mild fatigue and shortness of breath on exertion.  Dizziness is not bad at all.  He complains of some leg cramps and muscle spasms.  He did have ankle surgery on the right side which causes some spasms from time to time.  He  denies any bleeding per rectum or melena.  However he had one episode of slight amount of blood from the right ear.  He reports that he has used Q-tips to clean the ear prior to that.  Did not have any fevers or chills.  Did not have to go to the ER.  Appetite is 100%.  Energy levels are low.   REVIEW OF SYSTEMS:  Review of Systems  Constitutional: Positive for fatigue.  Respiratory: Positive for shortness of breath.   Gastrointestinal: Negative for constipation.  Neurological: Positive for dizziness.  All other systems reviewed and are negative.    PAST MEDICAL/SURGICAL HISTORY:  Past Medical History:  Diagnosis Date  . Arthritis   . 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 Cancer ([place]) 01-28-2014   skin cancer  . CKD (chronic kidney disease), stage III ([place])   . GERD (gastroesophageal reflux disease)   . Heart murmur    NOTED DURING PHYSICAL WHEN HE WAS ENLISTING IN [place] , DIDNT KNOW UNTIL THAT TIME AND REPORTS , "THATS THE LAST I HEARD ABOUT IT "   . History of hypertension    no longer issue  . History of kidney stones   . History of malignant melanoma of skin    excision top of scalp 2015-- no recurrence  . History of urinary retention    post op lumbar fusion surgery 04/ 2016  . Hypertension   . 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  Left ureteral calculus   . Seasonal allergies   . Wears glasses   . Wears glasses   . Wears partial dentures    upper and lower   Past Surgical History:  Procedure Laterality Date  . ANKLE FUSION Right 2007  . CARPAL TUNNEL RELEASE Left 12/28/2009   w/ pulley release left long finger  . CARPAL TUNNEL RELEASE Right 07/22/2013   Procedure: RIGHT CARPAL TUNNEL RELEASE;  Surgeon: Cammie Sickle., MD;  Location: [facility];  Service: Orthopedics;  Laterality: Right;  . COLONOSCOPY    . CYSTO/  LEFT RETROGRADE PYELOGRAM  11/21/2010  .  CYSTOSCOPY WITH STENT PLACEMENT Left 03/09/2016   Procedure: CYSTOSCOPY WITH STENT PLACEMENT;  Surgeon: Franchot Gallo, MD;  Location[facility];  Service: Urology;  Laterality: Left;  . CYSTOSCOPY/RETROGRADE/URETEROSCOPY/STONE EXTRACTION WITH BASKET Left 03/09/2016   Procedure: CYSTOSCOPY/RETROGRADE/URETEROSCOPY/STONE EXTRACTION WITH BASKET;  Surgeon: Franchot Gallo, MD;  Location: [facility];  Service: Urology;  Laterality: Left;  . LEFT URETEROSCOPIC LASER LITHOTRIPSY STONE EXTRACTION/ STENT PLACEMENT  05/23/2010  . MOHS SURGERY     TOP OF THE HEAD   . ORIF ANKLE FRACTURE Right 1978  . PORTACATH PLACEMENT Right 08/19/2018   Procedure: INSERTION PORT-A-CATH (attached catheter in right subclavian);  Surgeon: Aviva Signs, MD;  Location: [facility];  Service: General;  Laterality: Right;  . POSTERIOR LUMBAR FUSION  08/21/2014   laminectomy and decompression L2 -- L5  . RIGHT LOWER LEG SURGERY  X3  1975 to 1976   including ORIF  . TONSILLECTOMY AND ADENOIDECTOMY  1986  . UMBILICAL HERNIA REPAIR  2009 approx     SOCIAL HISTORY:  Social History   Socioeconomic History  . Marital status: Married    Spouse name: Not on file  . Number of children: Not on file  . Years of education: Not on file  . Highest education level: Not on file  Occupational History  . Not on file  Social Needs  . Financial resource strain: Not on file  . Food insecurity    Worry: Not on file    Inability: Not on file  . Transportation needs    Medical: Not on file    Non-medical: Not on file  Tobacco Use  . Smoking status: Former Smoker    Years: 20.00    Types: Cigarettes    Quit date: 07/17/1986    Years since quitting: 32.2  . Smokeless tobacco: Never Used  Substance and Sexual Activity  . Alcohol use: Yes    Alcohol/week: 7.0 - 14.0 standard drinks    Types: 7 - 14 Cans of beer per week    Comment: 1 -2 beer daily  . Drug use: No  . Sexual activity: Not on file   Lifestyle  . Physical activity    Days per week: Not on file    Minutes per session: Not on file  . Stress: Not on file  Relationships  . Social Herbalist on phone: Not on file    Gets together: Not on file    Attends religious service: Not on file    Active member of club or organization: Not on file    Attends meetings of clubs or organizations: Not on file    Relationship status: Not on file  . Intimate partner violence    Fear of current or ex partner: Not on file    Emotionally abused: Not on file    Physically abused:  Not on file    Forced sexual activity: Not on file  Other Topics Concern  . Not on file  Social History Narrative  . Not on file    FAMILY HISTORY:  Family History  Problem Relation Age of Onset  . Stroke Mother   . Prostate cancer Father   . Bone cancer Father   . Rheum arthritis Sister   . Urinary tract infection Sister     CURRENT MEDICATIONS:  Outpatient Encounter Medications as of 10/21/2018  Medication Sig  . acetaminophen (TYLENOL) 500 MG tablet Take 1,000 mg by mouth every 6 (six) hours as needed for mild pain or moderate pain.   Marland Kitchen allopurinol (ZYLOPRIM) 300 MG tablet Take 300 mg by mouth every evening.  Marland Kitchen azaCITIDine 5 mg/2 mLs in lactated ringers infusion Inject into the vein daily. Day 1-7 every 28 days (starting 08/22/2018)  . celecoxib (CELEBREX) 200 MG capsule Take 200 mg by mouth daily. IN THE MORNING  . cetirizine (ZYRTEC) 10 MG tablet Take 10 mg by mouth daily. IN THE MORNING  . diclofenac sodium (VOLTAREN) 1 % GEL Apply 1 g topically 3 (three) times daily as needed (knee pain.).   Marland Kitchen docusate sodium (COLACE) 100 MG capsule Take 100 mg by mouth at bedtime.   . fluticasone (FLONASE) 50 MCG/ACT nasal spray Place 1 spray into both nostrils daily. In the morning  . HYDROcodone-acetaminophen (NORCO) 5-325 MG tablet Take 1 tablet by mouth every 4 (four) hours as needed for moderate pain.  Marland Kitchen lansoprazole (PREVACID) 15 MG capsule  Take 15 mg by mouth daily before breakfast.   . lidocaine-prilocaine (EMLA) cream Apply pea-sized amount to port a cath site and cover with plastic wrap one hour prior to chemotherapy appointment  . lisinopril (PRINIVIL,ZESTRIL) 5 MG tablet Take 2.5 mg by mouth daily. In the morning  . magnesium oxide (MAG-OX) 400 MG tablet Take 400 mg by mouth at bedtime.   Vladimir Faster Glycol-Propyl Glycol (SYSTANE OP) Place 1 drop into both eyes 2 (two) times daily.  . prochlorperazine (COMPAZINE) 10 MG tablet Take 1 tablet (10 mg total) by mouth every 6 (six) hours as needed (Nausea or vomiting).  . tamsulosin (FLOMAX) 0.4 MG CAPS capsule Take 0.4 mg by mouth every evening.   . traMADol (ULTRAM) 50 MG tablet Take 50 mg by mouth 3 (three) times daily as needed (pain.).    No facility-administered encounter medications on file as of 10/21/2018.     ALLERGIES:  No Known Allergies   PHYSICAL EXAM:  ECOG Performance status: 1  Vitals:   10/21/18 0856  BP: 129/64  Pulse: 92  Resp: 18  Temp: 98.6 F (37 C)  SpO2: 98%   Filed Weights   10/21/18 0856  Weight: 231 lb 9.6 oz (105.1 kg)    Physical Exam Vitals signs reviewed.  Constitutional:      Appearance: Normal appearance.  Cardiovascular:     Rate and Rhythm: Normal rate and regular rhythm.     Heart sounds: Normal heart sounds.  Pulmonary:     Effort: Pulmonary effort is normal.     Breath sounds: Normal breath sounds.  Abdominal:     General: There is no distension.     Palpations: Abdomen is soft. There is no mass.  Musculoskeletal:        General: No swelling.  Skin:    General: Skin is warm.  Neurological:     General: No focal deficit present.     Mental Status: He  is alert and oriented to person, place, and time.  Psychiatric:        Mood and Affect: Mood normal.        Behavior: Behavior normal.      LABORATORY DATA:  I have reviewed the labs as listed.  CBC    Component Value Date/Time   WBC 1.2 (LL) 10/21/2018  0851   RBC 2.62 (L) 10/21/2018 0851   HGB 8.3 (L) 10/21/2018 0851   HCT 28.5 (L) 10/21/2018 0851   PLT 56 (L) 10/21/2018 0851   MCV 108.8 (H) 10/21/2018 0851   MCH 31.7 10/21/2018 0851   MCHC 29.1 (L) 10/21/2018 0851   RDW 18.5 (H) 10/21/2018 0851   LYMPHSABS 0.9 10/21/2018 0851   MONOABS 0.1 10/21/2018 0851   EOSABS 0.0 10/21/2018 0851   BASOSABS 0.0 10/21/2018 0851   CMP Latest Ref Rng & Units 10/21/2018 10/08/2018 10/01/2018  Glucose 70 - 99 mg/dL 114(H) 121(H) 123(H)  BUN 8 - 23 mg/dL 28(H) 22 31(H)  Creatinine 0.61 - 1.24 mg/dL 1.28(H) 1.38(H) 1.39(H)  Sodium 135 - 145 mmol/L 143 142 142  Potassium 3.5 - 5.1 mmol/L 4.3 4.9 4.6  Chloride 98 - 111 mmol/L 109 109 110  CO2 22 - 32 mmol/L 21(L) 24 23  Calcium 8.9 - 10.3 mg/dL 9.2 9.1 8.9  Total Protein 6.5 - 8.1 g/dL 6.3(L) 6.2(L) 6.0(L)  Total Bilirubin 0.3 - 1.2 mg/dL 1.1 1.0 1.2  Alkaline Phos 38 - 126 U/L 46 48 48  AST 15 - 41 U/L '25 28 29  ' ALT 0 - 44 U/L '22 27 26       ' DIAGNOSTIC IMAGING:  I have independently reviewed the scans and discussed with the patient.   I have reviewed Venita Lick LPN's note and agree with the documentation.  I personally performed a face-to-face visit, made revisions and my assessment and plan is as follows.    ASSESSMENT & PLAN:   MDS (myelodysplastic syndrome), high grade ([place]) 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  Nutritional work-up was negative. 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  Flow cytometry shows 12% blasts. 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  FISH panel is pending. 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 - Vidaza (50 mg/m2) cycle 1 on 08/22/2018. -He received 1 unit of platelets on 09/09/2018. - Cycle 2 of Vidaza (50 mg per  metered square) on 09/23/2018. - 1 unit PRBC on 10/01/2018 for hemoglobin of 6.5. - He did not receive any blood transfusion in the last 2 weeks.  His knee and ankle pains have been stable. - He had an episode of right ear bleeding with pain.  I have examined his right ear which had some blood clot in the external canal.  However eardrum was normal.  He no longer has any bleeding. - I have reviewed his blood work.  White count is 1.2 with ANC of 200.  Hemoglobin is 8.3.  Platelet count is barely above 50. -I have recommended holding his treatment today.  We will reevaluate him in 1 week. -He complained of cramps in the leg.  We have checked his magnesium which was normal at 1.9.  Potassium was also normal at 4.3.  2.  CKD: -Creatinine improved to 1.28.    Total time spent is  25 minutes with more than 50% of time spent face-to-face discussing treatment plan and coordination of care.    Orders placed this encounter:  Orders Placed This Encounter  Procedures  . Magnesium      [name], MD [place] [phone]

## 2018-10-21 NOTE — Patient Instructions (Addendum)
[facility] at [facility] Discharge Instructions  You were seen today by Dr. Katragadda. He went over your recent lab results. He will see you back in 1 week for labs, treatment and follow up.   Thank you for choosing [facility] at [facility] to provide your oncology and hematology care.  To afford each patient quality time with our provider, please arrive at least 15 minutes before your scheduled appointment time.   If you have a lab appointment with the Cancer Center please come in thru the  Main Entrance and check in at the main information desk  You need to re-schedule your appointment should you arrive 10 or more minutes late.  We strive to give you quality time with our providers, and arriving late affects you and other patients whose appointments are after yours.  Also, if you no show three or more times for appointments you may be dismissed from the clinic at the providers discretion.     Again, thank you for choosing [facility].  Our hope is that these requests will decrease the amount of time that you wait before being seen by our physicians.       _____________________________________________________________  Should you have questions after your visit to  Cancer Center, please contact our office at [phone] between the hours of 8:00 a.m. and 4:30 p.m.  Voicemails left after 4:00 p.m. will not be returned until the following business day.  For prescription refill requests, have your pharmacy contact our office and allow 72 hours.    Cancer Center Support Programs:   > Cancer Support Group  2nd Tuesday of the month 1pm-2pm, Journey Room    

## 2018-10-21 NOTE — Progress Notes (Signed)
CRITICAL VALUE ALERT  Critical Value:  WBC 1.2  Date & Time Notied:  10/21/2018 at [facility]  Provider Notified: Dr. Delton Coombes  Orders Received/Actions taken: n/a

## 2018-10-21 NOTE — Progress Notes (Signed)
Pt presents today for treatment and physician appointment with Dr. Delton Coombes. VSS. Pt has complaints of his R ear bleeding yesterday most of the day, leg cramps at night and fatigue. MAR reviewed .   Hold tx today per ATravis LPN via instant message.   No treatment given today per MD orders. Discharged from clinic ambulatory. F/U with [facility] as scheduled.

## 2018-10-21 NOTE — Patient Instructions (Signed)
[facility] at [facility]  Discharge Instructions:   _______________________________________________________________  Thank you for choosing [facility] at [facility] to provide your oncology and hematology care.  To afford each patient quality time with our providers, please arrive at least 15 minutes before your scheduled appointment.  You need to re-schedule your appointment if you arrive 10 or more minutes late.  We strive to give you quality time with our providers, and arriving late affects you and other patients whose appointments are after yours.  Also, if you no show three or more times for appointments you may be dismissed from the clinic.  Again, thank you for choosing [facility]. Our hope is that these requests will allow you access to exceptional care and in a timely manner. _______________________________________________________________  If you have questions after your visit, please contact our office at [phone] between the hours of 8:30 a.m. and 5:00 p.m. Voicemails left after 4:30 p.m. will not be returned until the following business day. _______________________________________________________________  For prescription refill requests, have your pharmacy contact our office. _______________________________________________________________  Recommendations made by the consultant and any test results will be sent to your referring physician. _______________________________________________________________ 

## 2018-10-21 NOTE — Assessment & Plan Note (Signed)
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  Nutritional work-up was negative. 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  Flow cytometry shows 12% blasts. 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  FISH panel is pending. 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 - Vidaza (50 mg/m2) cycle 1 on 08/22/2018. -He received 1 unit of platelets on 09/09/2018. - Cycle 2 of Vidaza (50 mg per metered square) on 09/23/2018. - 1 unit PRBC on 10/01/2018 for hemoglobin of 6.5. - He did not receive any blood transfusion in the last 2 weeks.  His knee and ankle pains have been stable. - He had an episode of right ear bleeding with pain.  I have examined his right ear which had some blood clot in the external canal.  However eardrum was normal.  He no longer has any bleeding. - I have reviewed his blood work.  White count is 1.2 with ANC of 200.  Hemoglobin is 8.3.  Platelet count is barely above 50. -I have recommended holding his treatment today.  We will reevaluate him in 1 week. -He complained of cramps in the leg.  We have checked his magnesium which was normal at 1.9.  Potassium was also normal at 4.3.  2.  CKD: -Creatinine improved to 1.28.

## 2018-10-22 ENCOUNTER — Ambulatory Visit (HOSPITAL_COMMUNITY): Payer: Medicare Other

## 2018-10-23 ENCOUNTER — Ambulatory Visit (HOSPITAL_COMMUNITY): Payer: Medicare Other

## 2018-10-24 ENCOUNTER — Ambulatory Visit (HOSPITAL_COMMUNITY): Payer: Medicare Other

## 2018-10-25 ENCOUNTER — Ambulatory Visit (HOSPITAL_COMMUNITY): Payer: Medicare Other

## 2018-10-28 ENCOUNTER — Encounter (HOSPITAL_COMMUNITY): Payer: Self-pay | Admitting: Hematology

## 2018-10-28 ENCOUNTER — Inpatient Hospital Stay (HOSPITAL_COMMUNITY): Payer: Medicare Other

## 2018-10-28 ENCOUNTER — Inpatient Hospital Stay (HOSPITAL_BASED_OUTPATIENT_CLINIC_OR_DEPARTMENT_OTHER): Payer: Medicare Other | Admitting: Hematology

## 2018-10-28 ENCOUNTER — Other Ambulatory Visit: Payer: Self-pay

## 2018-10-28 ENCOUNTER — Other Ambulatory Visit (HOSPITAL_COMMUNITY): Payer: Medicare Other

## 2018-10-28 VITALS — BP 125/65 | HR 79 | Temp 98.2°F | Resp 18

## 2018-10-28 DIAGNOSIS — Z5111 Encounter for antineoplastic chemotherapy: Secondary | ICD-10-CM | POA: Diagnosis not present

## 2018-10-28 DIAGNOSIS — D46Z Other myelodysplastic syndromes: Secondary | ICD-10-CM

## 2018-10-28 DIAGNOSIS — D4622 Refractory anemia with excess of blasts 2: Secondary | ICD-10-CM

## 2018-10-28 DIAGNOSIS — N189 Chronic kidney disease, unspecified: Secondary | ICD-10-CM | POA: Diagnosis not present

## 2018-10-28 DIAGNOSIS — Z95828 Presence of other vascular implants and grafts: Secondary | ICD-10-CM

## 2018-10-28 LAB — CBC WITH DIFFERENTIAL/PLATELET
Abs Immature Granulocytes: 0.01 10*3/uL (ref 0.00–0.07)
Basophils Absolute: 0 10*3/uL (ref 0.0–0.1)
Basophils Relative: 0 %
Eosinophils Absolute: 0 10*3/uL (ref 0.0–0.5)
Eosinophils Relative: 0 %
HCT: 29.2 % — ABNORMAL LOW (ref 39.0–52.0)
Hemoglobin: 8.4 g/dL — ABNORMAL LOW (ref 13.0–17.0)
Immature Granulocytes: 1 %
Lymphocytes Relative: 52 %
Lymphs Abs: 0.9 10*3/uL (ref 0.7–4.0)
MCH: 31 pg (ref 26.0–34.0)
MCHC: 28.8 g/dL — ABNORMAL LOW (ref 30.0–36.0)
MCV: 107.7 fL — ABNORMAL HIGH (ref 80.0–100.0)
Monocytes Absolute: 0.3 10*3/uL (ref 0.1–1.0)
Monocytes Relative: 18 %
Neutro Abs: 0.5 10*3/uL — ABNORMAL LOW (ref 1.7–7.7)
Neutrophils Relative %: 29 %
Platelets: 47 10*3/uL — ABNORMAL LOW (ref 150–400)
RBC: 2.71 MIL/uL — ABNORMAL LOW (ref 4.22–5.81)
RDW: 18.7 % — ABNORMAL HIGH (ref 11.5–15.5)
WBC: 1.6 10*3/uL — ABNORMAL LOW (ref 4.0–10.5)
nRBC: 7.4 % — ABNORMAL HIGH (ref 0.0–0.2)

## 2018-10-28 LAB — COMPREHENSIVE METABOLIC PANEL
ALT: 21 U/L (ref 0–44)
AST: 27 U/L (ref 15–41)
Albumin: 3.8 g/dL (ref 3.5–5.0)
Alkaline Phosphatase: 51 U/L (ref 38–126)
Anion gap: 10 (ref 5–15)
BUN: 24 mg/dL — ABNORMAL HIGH (ref 8–23)
CO2: 22 mmol/L (ref 22–32)
Calcium: 9.2 mg/dL (ref 8.9–10.3)
Chloride: 108 mmol/L (ref 98–111)
Creatinine, Ser: 1.33 mg/dL — ABNORMAL HIGH (ref 0.61–1.24)
GFR calc Af Amer: >60 mL/min (ref >60)
GFR calc non Af Amer: 53 mL/min — ABNORMAL LOW (ref >60)
Glucose, Bld: 115 mg/dL — ABNORMAL HIGH (ref 70–99)
Potassium: 4.1 mmol/L (ref 3.5–5.1)
Sodium: 140 mmol/L (ref 135–145)
Total Bilirubin: 1.1 mg/dL (ref 0.3–1.2)
Total Protein: 6.3 g/dL — ABNORMAL LOW (ref 6.5–8.1)

## 2018-10-28 MED ORDER — SODIUM CHLORIDE 0.9 % IV SOLN
100.0000 mg | Freq: Once | INTRAVENOUS | Status: AC
  Administered 2018-10-28: 100 mg via INTRAVENOUS
  Filled 2018-10-28: qty 10

## 2018-10-28 MED ORDER — SODIUM CHLORIDE 0.9% FLUSH
10.0000 mL | INTRAVENOUS | Status: DC | PRN
  Administered 2018-10-28: 10 mL
  Filled 2018-10-28: qty 10

## 2018-10-28 MED ORDER — PALONOSETRON HCL INJECTION 0.25 MG/5ML
INTRAVENOUS | Status: AC
  Filled 2018-10-28: qty 5

## 2018-10-28 MED ORDER — PALONOSETRON HCL INJECTION 0.25 MG/5ML
0.2500 mg | Freq: Once | INTRAVENOUS | Status: AC
  Administered 2018-10-28: 0.25 mg via INTRAVENOUS

## 2018-10-28 MED ORDER — SODIUM CHLORIDE 0.9 % IV SOLN
Freq: Once | INTRAVENOUS | Status: AC
  Administered 2018-10-28: 11:00:00 via INTRAVENOUS

## 2018-10-28 MED ORDER — HEPARIN SOD (PORK) LOCK FLUSH 100 UNIT/ML IV SOLN
500.0000 [IU] | Freq: Once | INTRAVENOUS | Status: AC | PRN
  Administered 2018-10-28: 500 [IU]

## 2018-10-28 NOTE — Progress Notes (Signed)
[facility] [place], [place] [address]   CLINIC:  Medical Oncology/Hematology  PCP:  [name], MD [address] [place] [address] [phone]   REASON FOR VISIT: Follow-up for MDS  CURRENT THERAPY: Vidaza every 28 days  BRIEF ONCOLOGIC HISTORY:  Oncology History  MDS (myelodysplastic syndrome), high grade ([place])  08/14/2018 Initial Diagnosis   MDS (myelodysplastic syndrome), high grade ([place])   08/22/2018 -  Chemotherapy   The patient had palonosetron (ALOXI) injection 0.25 mg, 0.25 mg, Intravenous,  Once, 3 of 6 cycles Administration: 0.25 mg (08/22/2018), 0.25 mg (08/26/2018), 0.25 mg (08/28/2018), 0.25 mg (08/30/2018), 0.25 mg (10/01/2018), 0.25 mg (10/02/2018), 0.25 mg (09/23/2018), 0.25 mg (09/25/2018), 0.25 mg (09/27/2018), 0.25 mg (10/28/2018) azaCITIDine (VIDAZA) 100 mg in sodium chloride 0.9 % 50 mL chemo infusion, 110 mg (66.7 % of original dose 75 mg/m2), Intravenous, Once, 3 of 6 cycles Dose modification: 50 mg/m2 (original dose 75 mg/m2, Cycle 1, Reason: Provider Judgment), 50 mg/m2 (original dose 75 mg/m2, Cycle 2, Reason: Provider Judgment) Administration: 100 mg (08/22/2018), 100 mg (08/23/2018), 100 mg (08/26/2018), 100 mg (08/27/2018), 100 mg (08/28/2018), 100 mg (08/29/2018), 100 mg (08/30/2018), 100 mg (10/01/2018), 100 mg (10/02/2018), 100 mg (09/23/2018), 100 mg (09/24/2018), 100 mg (09/25/2018), 100 mg (09/26/2018), 100 mg (09/27/2018), 100 mg (10/28/2018)  for chemotherapy treatment.       INTERVAL HISTORY:  Richard Wallace 73 y.o. male returns for routine follow-up for MDS. He reports he has been doing well since his last visit. He does have a few mouth sores. They come and go but overall has improved. He is still fatigued and has occasional nausea. Denies any nausea, vomiting, or diarrhea. Denies any new pains. Had not noticed any recent bleeding such as epistaxis, hematuria or hematochezia. Denies recent chest pain on exertion, shortness of breath on  minimal exertion, pre-syncopal episodes, or palpitations. Denies any numbness or tingling in hands or feet. Denies any recent fevers, infections, or recent hospitalizations. Patient reports appetite at 100% and energy level at 0%. Over all he is ready for his next treatment. He is eating well and maintaining his weight at this time.    REVIEW OF SYSTEMS:  Review of Systems  Constitutional: Positive for fatigue.  HENT:   Positive for mouth sores.   Cardiovascular: Positive for leg swelling.  Gastrointestinal: Positive for nausea.  Neurological: Positive for dizziness.  Hematological: Bruises/bleeds easily.  All other systems reviewed and are negative.    PAST MEDICAL/SURGICAL HISTORY:  Past Medical History:  Diagnosis Date  . Arthritis   . 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 Cancer ([place]) 01-28-2014   skin cancer  . CKD (chronic kidney disease), stage III ([place])   . GERD (gastroesophageal reflux disease)   . Heart murmur    NOTED DURING PHYSICAL WHEN HE WAS ENLISTING IN [facility] , DIDNT KNOW UNTIL THAT TIME AND REPORTS , "THATS THE LAST I HEARD ABOUT IT "   . History of hypertension    no longer issue  . History of kidney stones   . History of malignant melanoma of skin    excision top of scalp 2015-- no recurrence  . History of urinary retention    post op lumbar fusion surgery 04/ 2016  . Hypertension   . 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 Left ureteral calculus   . Seasonal allergies   .  Wears glasses   . Wears glasses   . Wears partial dentures    upper and lower   Past Surgical History:  Procedure Laterality Date  . ANKLE FUSION Right 2007  . CARPAL TUNNEL RELEASE Left 12/28/2009   w/ pulley release left long finger  . CARPAL TUNNEL RELEASE Right 07/22/2013   Procedure: RIGHT CARPAL TUNNEL RELEASE;  Surgeon: Cammie Sickle., MD;  Location: [facility];  Service:  Orthopedics;  Laterality: Right;  . COLONOSCOPY    . CYSTO/  LEFT RETROGRADE PYELOGRAM  11/21/2010  . CYSTOSCOPY WITH STENT PLACEMENT Left 03/09/2016   Procedure: CYSTOSCOPY WITH STENT PLACEMENT;  Surgeon: Franchot Gallo, MD;  Location: [facility];  Service: Urology;  Laterality: Left;  . CYSTOSCOPY/RETROGRADE/URETEROSCOPY/STONE EXTRACTION WITH BASKET Left 03/09/2016   Procedure: CYSTOSCOPY/RETROGRADE/URETEROSCOPY/STONE EXTRACTION WITH BASKET;  Surgeon: Franchot Gallo, MD;  Location: [facility];  Service: Urology;  Laterality: Left;  . LEFT URETEROSCOPIC LASER LITHOTRIPSY STONE EXTRACTION/ STENT PLACEMENT  05/23/2010  . MOHS SURGERY     TOP OF THE HEAD   . ORIF ANKLE FRACTURE Right 1978  . PORTACATH PLACEMENT Right 08/19/2018   Procedure: INSERTION PORT-A-CATH (attached catheter in right subclavian);  Surgeon: Aviva Signs, MD;  Location: [facility];  Service: General;  Laterality: Right;  . POSTERIOR LUMBAR FUSION  08/21/2014   laminectomy and decompression L2 -- L5  . RIGHT LOWER LEG SURGERY  X3  1975 to 1976   including ORIF  . TONSILLECTOMY AND ADENOIDECTOMY  1986  . UMBILICAL HERNIA REPAIR  2009 approx     SOCIAL HISTORY:  Social History   Socioeconomic History  . Marital status: Married    Spouse name: Not on file  . Number of children: Not on file  . Years of education: Not on file  . Highest education level: Not on file  Occupational History  . Not on file  Social Needs  . Financial resource strain: Not on file  . Food insecurity    Worry: Not on file    Inability: Not on file  . Transportation needs    Medical: Not on file    Non-medical: Not on file  Tobacco Use  . Smoking status: Former Smoker    Years: 20.00    Types: Cigarettes    Quit date: 07/17/1986    Years since quitting: 32.3  . Smokeless tobacco: Never Used  Substance and Sexual Activity  . Alcohol use: Yes    Alcohol/week: 7.0 - 14.0 standard drinks    Types: 7 -  14 Cans of beer per week    Comment: 1 -2 beer daily  . Drug use: No  . Sexual activity: Not on file  Lifestyle  . Physical activity    Days per week: Not on file    Minutes per session: Not on file  . Stress: Not on file  Relationships  . Social Herbalist on phone: Not on file    Gets together: Not on file    Attends religious service: Not on file    Active member of club or organization: Not on file    Attends meetings of clubs or organizations: Not on file    Relationship status: Not on file  . Intimate partner violence    Fear of current or ex partner: Not on file    Emotionally abused: Not on file    Physically abused: Not on file    Forced sexual activity: Not on  file  Other Topics Concern  . Not on file  Social History Narrative  . Not on file    FAMILY HISTORY:  Family History  Problem Relation Age of Onset  . Stroke Mother   . Prostate cancer Father   . Bone cancer Father   . Rheum arthritis Sister   . Urinary tract infection Sister     CURRENT MEDICATIONS:  Outpatient Encounter Medications as of 10/28/2018  Medication Sig  . acetaminophen (TYLENOL) 500 MG tablet Take 1,000 mg by mouth every 6 (six) hours as needed for mild pain or moderate pain.   Marland Kitchen allopurinol (ZYLOPRIM) 300 MG tablet Take 300 mg by mouth every evening.  Marland Kitchen azaCITIDine 5 mg/2 mLs in lactated ringers infusion Inject into the vein daily. Day 1-7 every 28 days (starting 08/22/2018)  . celecoxib (CELEBREX) 200 MG capsule Take 200 mg by mouth daily. IN THE MORNING  . cetirizine (ZYRTEC) 10 MG tablet Take 10 mg by mouth daily. IN THE MORNING  . diclofenac sodium (VOLTAREN) 1 % GEL Apply 1 g topically 3 (three) times daily as needed (knee pain.).   Marland Kitchen docusate sodium (COLACE) 100 MG capsule Take 100 mg by mouth at bedtime.   . fluticasone (FLONASE) 50 MCG/ACT nasal spray Place 1 spray into both nostrils daily. In the morning  . HYDROcodone-acetaminophen (NORCO) 5-325 MG tablet Take 1 tablet  by mouth every 4 (four) hours as needed for moderate pain.  Marland Kitchen lansoprazole (PREVACID) 15 MG capsule Take 15 mg by mouth daily before breakfast.   . lidocaine-prilocaine (EMLA) cream Apply pea-sized amount to port a cath site and cover with plastic wrap one hour prior to chemotherapy appointment  . lisinopril (PRINIVIL,ZESTRIL) 5 MG tablet Take 2.5 mg by mouth daily. In the morning  . magnesium oxide (MAG-OX) 400 MG tablet Take 400 mg by mouth at bedtime.   Vladimir Faster Glycol-Propyl Glycol (SYSTANE OP) Place 1 drop into both eyes 2 (two) times daily.  . prochlorperazine (COMPAZINE) 10 MG tablet Take 1 tablet (10 mg total) by mouth every 6 (six) hours as needed (Nausea or vomiting).  . tamsulosin (FLOMAX) 0.4 MG CAPS capsule Take 0.4 mg by mouth every evening.   . traMADol (ULTRAM) 50 MG tablet Take 50 mg by mouth 3 (three) times daily as needed (pain.).    No facility-administered encounter medications on file as of 10/28/2018.     ALLERGIES:  No Known Allergies   PHYSICAL EXAM:  ECOG Performance status: 1  Vitals:   10/28/18 0911  BP: 123/68  Pulse: 93  Resp: 14  Temp: 98.2 F (36.8 C)  SpO2: 99%   Filed Weights   10/28/18 0911  Weight: 230 lb 12.8 oz (104.7 kg)    Physical Exam Constitutional:      Appearance: Normal appearance. He is normal weight.  Cardiovascular:     Rate and Rhythm: Normal rate and regular rhythm.     Heart sounds: Normal heart sounds.  Pulmonary:     Effort: Pulmonary effort is normal.     Breath sounds: Normal breath sounds.  Abdominal:     General: Bowel sounds are normal.     Palpations: Abdomen is soft.  Musculoskeletal: Normal range of motion.  Skin:    General: Skin is warm and dry.  Neurological:     Mental Status: He is alert and oriented to person, place, and time. Mental status is at baseline.  Psychiatric:        Mood and Affect: Mood normal.  Behavior: Behavior normal.        Thought Content: Thought content normal.         Judgment: Judgment normal.      LABORATORY DATA:  I have reviewed the labs as listed.  CBC    Component Value Date/Time   WBC 1.6 (L) 10/28/2018 0901   RBC 2.71 (L) 10/28/2018 0901   HGB 8.4 (L) 10/28/2018 0901   HCT 29.2 (L) 10/28/2018 0901   PLT 47 (L) 10/28/2018 0901   MCV 107.7 (H) 10/28/2018 0901   MCH 31.0 10/28/2018 0901   MCHC 28.8 (L) 10/28/2018 0901   RDW 18.7 (H) 10/28/2018 0901   LYMPHSABS 0.9 10/28/2018 0901   MONOABS 0.3 10/28/2018 0901   EOSABS 0.0 10/28/2018 0901   BASOSABS 0.0 10/28/2018 0901   CMP Latest Ref Rng & Units 10/28/2018 10/21/2018 10/08/2018  Glucose 70 - 99 mg/dL 115(H) 114(H) 121(H)  BUN 8 - 23 mg/dL 24(H) 28(H) 22  Creatinine 0.61 - 1.24 mg/dL 1.33(H) 1.28(H) 1.38(H)  Sodium 135 - 145 mmol/L 140 143 142  Potassium 3.5 - 5.1 mmol/L 4.1 4.3 4.9  Chloride 98 - 111 mmol/L 108 109 109  CO2 22 - 32 mmol/L 22 21(L) 24  Calcium 8.9 - 10.3 mg/dL 9.2 9.2 9.1  Total Protein 6.5 - 8.1 g/dL 6.3(L) 6.3(L) 6.2(L)  Total Bilirubin 0.3 - 1.2 mg/dL 1.1 1.1 1.0  Alkaline Phos 38 - 126 U/L 51 46 48  AST 15 - 41 U/L _0 ALT 0 - 44 U/L _1 DIAGNOSTIC IMAGING:  I have independently reviewed the scans and discussed with the patient.   I have reviewed Francene Finders, NP's note and agree with the documentation.  I personally performed a face-to-face visit, made revisions and my assessment and plan is as follows.    ASSESSMENT & PLAN:   MDS (myelodysplastic syndrome), high grade ([place]) 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  Nutritional work-up was negative. 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  Flow cytometry shows 12% blasts. 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  FISH panel is pending. - IPSS-R score of 5.5 with high risk MDS, median survival of 1.6 years without treatment, 25% AML progression in the absence  of treatment of 1.4 years. - Vidaza (50 mg/m2) cycle 1 on 08/22/2018. -He received 1 unit of platelets on 09/09/2018. - Cycle 2 of Vidaza (50 mg per metered square) on 09/23/2018. - 1 unit PRBC on 10/01/2018 for hemoglobin of 6.5. - I have held his cycle 3 last week because of severe neutropenia. - His white count improved with ANC of 500.  Hence he will proceed with his cycle 3 without any dose changes.  He is already dose reduced to 50 mg per metered square daily. -We will closely monitor his counts to see if he needs any transfusions on a weekly basis.  I will see him back in 2 weeks for follow-up.  2.  CKD: -His creatinine is 1.33 today.     Total time spent is 25 minutes with more than 50% of the time spent face-to-face discussing treatment plan and coordination of care.    Orders placed this encounter:  No orders of the defined types were placed in this encounter.     [name], MD [place] [phone]

## 2018-10-28 NOTE — Assessment & Plan Note (Signed)
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  Nutritional work-up was negative. 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  Flow cytometry shows 12% blasts. 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  FISH panel is pending. 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 - Vidaza (50 mg/m2) cycle 1 on 08/22/2018. -He received 1 unit of platelets on 09/09/2018. - Cycle 2 of Vidaza (50 mg per metered square) on 09/23/2018. - 1 unit PRBC on 10/01/2018 for hemoglobin of 6.5. - I have held his cycle 3 last week because of severe neutropenia. - His white count improved with ANC of 500.  Hence he will proceed with his cycle 3 without any dose changes.  He is already dose reduced to 50 mg per metered square daily. -We will closely monitor his counts to see if he needs any transfusions on a weekly basis.  I will see him back in 2 weeks for follow-up.  2.  CKD: -His creatinine is 1.33 today.

## 2018-10-28 NOTE — Patient Instructions (Signed)
[facility] Discharge Instructions for Patients Receiving Chemotherapy   Beginning January 23rd 2017 lab work for the Cancer Center will be done in the  Main lab at [facility] on 1st floor. If you have a lab appointment with the Cancer Center please come in thru the  Main Entrance and check in at the main information desk   Today you received the following chemotherapy agents Vidaza. Follow up as scheduled. Call clinic for any questions or concerns.   To help prevent nausea and vomiting after your treatment, we encourage you to take your nausea medication   If you develop nausea and vomiting, or diarrhea that is not controlled by your medication, call the clinic.  The clinic phone number is [phone]. Office hours are Monday-Friday 8:30am-5:00pm.  BELOW ARE SYMPTOMS THAT SHOULD BE REPORTED IMMEDIATELY:  *FEVER GREATER THAN 101.0 F  *CHILLS WITH OR WITHOUT FEVER  NAUSEA AND VOMITING THAT IS NOT CONTROLLED WITH YOUR NAUSEA MEDICATION  *UNUSUAL SHORTNESS OF BREATH  *UNUSUAL BRUISING OR BLEEDING  TENDERNESS IN MOUTH AND THROAT WITH OR WITHOUT PRESENCE OF ULCERS  *URINARY PROBLEMS  *BOWEL PROBLEMS  UNUSUAL RASH Items with * indicate a potential emergency and should be followed up as soon as possible. If you have an emergency after office hours please contact your primary care physician or go to the nearest emergency department.  Please call the clinic during office hours if you have any questions or concerns.   You may also contact the Patient Navigator at [phone] should you have any questions or need assistance in obtaining follow up care.      Resources For Cancer Patients and their Caregivers ? American Cancer Society: Can assist with transportation, wigs, general needs, runs Look Good Feel Better.        1-888-227-6333 ? Cancer Care: Provides financial assistance, online support groups, medication/co-pay assistance.  1-800-813-HOPE  (4673) ? [facility] Assists [place] cancer patients and their families through emotional , educational and financial support.  336-427-4357 ? [place] DSS Where to apply for food stamps, Medicaid and utility assistance. 336-342-1394 ? [facility]: Transportation to medical appointments. 336-347-2287 ? Social Security Administration: May apply for disability if have a Stage IV cancer. 336-342-7796 1-800-772-1213 ? [place] Aging, Disability and Transit Services: Assists with nutrition, care and transit needs. 336-349-2343          

## 2018-10-28 NOTE — Patient Instructions (Signed)
[facility] Discharge Instructions  Labs drawn from portacath today   Thank you for choosing [facility] at [facility] to provide your oncology and hematology care.  To afford each patient quality time with our provider, please arrive at least 15 minutes before your scheduled appointment time.   If you have a lab appointment with the Cancer Center please come in thru the  Main Entrance and check in at the main information desk  You need to re-schedule your appointment should you arrive 10 or more minutes late.  We strive to give you quality time with our providers, and arriving late affects you and other patients whose appointments are after yours.  Also, if you no show three or more times for appointments you may be dismissed from the clinic at the providers discretion.     Again, thank you for choosing [facility].  Our hope is that these requests will decrease the amount of time that you wait before being seen by our physicians.       _____________________________________________________________  Should you have questions after your visit to [facility], please contact our office at [phone] between the hours of 8:00 a.m. and 4:30 p.m.  Voicemails left after 4:00 p.m. will not be returned until the following business day.  For prescription refill requests, have your pharmacy contact our office and allow 72 hours.    Cancer Center Support Programs:   > Cancer Support Group  2nd Tuesday of the month 1pm-2pm, Journey Room   

## 2018-10-28 NOTE — Patient Instructions (Signed)
[facility]  Discharge Instructions:  You saw Dr. Katragadda today. _______________________________________________________________  Thank you for choosing [facility] at [facility] to provide your oncology and hematology care.  To afford each patient quality time with our providers, please arrive at least 15 minutes before your scheduled appointment.  You need to re-schedule your appointment if you arrive 10 or more minutes late.  We strive to give you quality time with our providers, and arriving late affects you and other patients whose appointments are after yours.  Also, if you no show three or more times for appointments you may be dismissed from the clinic.  Again, thank you for choosing [facility] at [facility]. Our hope is that these requests will allow you access to exceptional care and in a timely manner. _______________________________________________________________  If you have questions after your visit, please contact our office at [phone] between the hours of 8:30 a.m. and 5:00 p.m. Voicemails left after 4:30 p.m. will not be returned until the following business day. _______________________________________________________________  For prescription refill requests, have your pharmacy contact our office. _______________________________________________________________  Recommendations made by the consultant and any test results will be sent to your referring physician. _______________________________________________________________ 

## 2018-10-28 NOTE — Progress Notes (Signed)
1025 Labs reviewed with and pt seen by Dr. Delton Coombes and pt approved for Vidaza infusion today per MD                   [name] tolerated Vidaza infusion well without complaints or incident. VSS upon discharge. Pt discharged self ambulatory in satisfactory condition

## 2018-10-29 ENCOUNTER — Other Ambulatory Visit: Payer: Self-pay

## 2018-10-29 ENCOUNTER — Encounter (HOSPITAL_COMMUNITY): Payer: Self-pay

## 2018-10-29 ENCOUNTER — Inpatient Hospital Stay (HOSPITAL_COMMUNITY): Payer: Medicare Other

## 2018-10-29 VITALS — BP 115/63 | HR 78 | Temp 98.0°F | Resp 18

## 2018-10-29 DIAGNOSIS — Z5111 Encounter for antineoplastic chemotherapy: Secondary | ICD-10-CM | POA: Diagnosis not present

## 2018-10-29 DIAGNOSIS — D46Z Other myelodysplastic syndromes: Secondary | ICD-10-CM

## 2018-10-29 DIAGNOSIS — Z95828 Presence of other vascular implants and grafts: Secondary | ICD-10-CM

## 2018-10-29 MED ORDER — SODIUM CHLORIDE 0.9 % IV SOLN
Freq: Once | INTRAVENOUS | Status: AC
  Administered 2018-10-29: 10:00:00 via INTRAVENOUS

## 2018-10-29 MED ORDER — SODIUM CHLORIDE 0.9% FLUSH
10.0000 mL | INTRAVENOUS | Status: DC | PRN
  Administered 2018-10-29: 10 mL
  Filled 2018-10-29: qty 10

## 2018-10-29 MED ORDER — HEPARIN SOD (PORK) LOCK FLUSH 100 UNIT/ML IV SOLN
500.0000 [IU] | Freq: Once | INTRAVENOUS | Status: AC | PRN
  Administered 2018-10-29: 500 [IU]

## 2018-10-29 MED ORDER — SODIUM CHLORIDE 0.9 % IV SOLN
100.0000 mg | Freq: Once | INTRAVENOUS | Status: AC
  Administered 2018-10-29: 100 mg via INTRAVENOUS
  Filled 2018-10-29: qty 10

## 2018-10-29 NOTE — Patient Instructions (Signed)
[facility] Discharge Instructions for Patients Receiving Chemotherapy  Today you received the following chemotherapy agents   To help prevent nausea and vomiting after your treatment, we encourage you to take your nausea medication   If you develop nausea and vomiting that is not controlled by your nausea medication, call the clinic.   BELOW ARE SYMPTOMS THAT SHOULD BE REPORTED IMMEDIATELY:  *FEVER GREATER THAN 100.5 F  *CHILLS WITH OR WITHOUT FEVER  NAUSEA AND VOMITING THAT IS NOT CONTROLLED WITH YOUR NAUSEA MEDICATION  *UNUSUAL SHORTNESS OF BREATH  *UNUSUAL BRUISING OR BLEEDING  TENDERNESS IN MOUTH AND THROAT WITH OR WITHOUT PRESENCE OF ULCERS  *URINARY PROBLEMS  *BOWEL PROBLEMS  UNUSUAL RASH Items with * indicate a potential emergency and should be followed up as soon as possible.  Feel free to call the clinic should you have any questions or concerns. The clinic phone number is [phone].  Please show the CHEMO ALERT CARD at check-in to the Emergency Department and triage nurse.   

## 2018-10-29 NOTE — Progress Notes (Signed)
Pt presents today for treatment Day 2 Vidaza. VS within parameters for tx. MAR reviewed. Pt has no complaints of any changes since the last visit.   Treatment given today per MD orders. Tolerated infusion without adverse affects. Vital signs stable. No complaints at this time. Discharged from clinic ambulatory. F/U with [facility] as scheduled.

## 2018-10-30 ENCOUNTER — Encounter (HOSPITAL_COMMUNITY): Payer: Self-pay

## 2018-10-30 ENCOUNTER — Inpatient Hospital Stay (HOSPITAL_COMMUNITY): Payer: Medicare Other

## 2018-10-30 VITALS — BP 120/58 | HR 74 | Temp 97.9°F | Resp 18

## 2018-10-30 DIAGNOSIS — Z95828 Presence of other vascular implants and grafts: Secondary | ICD-10-CM

## 2018-10-30 DIAGNOSIS — Z5111 Encounter for antineoplastic chemotherapy: Secondary | ICD-10-CM | POA: Diagnosis not present

## 2018-10-30 DIAGNOSIS — D46Z Other myelodysplastic syndromes: Secondary | ICD-10-CM

## 2018-10-30 MED ORDER — SODIUM CHLORIDE 0.9 % IV SOLN
Freq: Once | INTRAVENOUS | Status: AC
  Administered 2018-10-30: 09:00:00 via INTRAVENOUS

## 2018-10-30 MED ORDER — SODIUM CHLORIDE 0.9% FLUSH
10.0000 mL | INTRAVENOUS | Status: DC | PRN
  Administered 2018-10-30: 10 mL
  Filled 2018-10-30: qty 10

## 2018-10-30 MED ORDER — PALONOSETRON HCL INJECTION 0.25 MG/5ML
0.2500 mg | Freq: Once | INTRAVENOUS | Status: AC
  Administered 2018-10-30: 0.25 mg via INTRAVENOUS
  Filled 2018-10-30: qty 5

## 2018-10-30 MED ORDER — HEPARIN SOD (PORK) LOCK FLUSH 100 UNIT/ML IV SOLN
500.0000 [IU] | Freq: Once | INTRAVENOUS | Status: AC | PRN
  Administered 2018-10-30: 500 [IU]

## 2018-10-30 MED ORDER — SODIUM CHLORIDE 0.9 % IV SOLN
100.0000 mg | Freq: Once | INTRAVENOUS | Status: AC
  Administered 2018-10-30: 100 mg via INTRAVENOUS
  Filled 2018-10-30: qty 10

## 2018-10-30 NOTE — Progress Notes (Signed)
Pt presents today for Day 3 Vidaza. VS within parameters for treatment. Pt has no complaints of any changes since the last visit.   Treatment given today per MD orders. Tolerated infusion without adverse affects. Vital signs stable. No complaints at this time. Discharged from clinic ambulatory. F/U with [facility] as scheduled.

## 2018-10-30 NOTE — Patient Instructions (Signed)
[facility] Discharge Instructions for Patients Receiving Chemotherapy  Today you received the following chemotherapy agents   To help prevent nausea and vomiting after your treatment, we encourage you to take your nausea medication   If you develop nausea and vomiting that is not controlled by your nausea medication, call the clinic.   BELOW ARE SYMPTOMS THAT SHOULD BE REPORTED IMMEDIATELY:  *FEVER GREATER THAN 100.5 F  *CHILLS WITH OR WITHOUT FEVER  NAUSEA AND VOMITING THAT IS NOT CONTROLLED WITH YOUR NAUSEA MEDICATION  *UNUSUAL SHORTNESS OF BREATH  *UNUSUAL BRUISING OR BLEEDING  TENDERNESS IN MOUTH AND THROAT WITH OR WITHOUT PRESENCE OF ULCERS  *URINARY PROBLEMS  *BOWEL PROBLEMS  UNUSUAL RASH Items with * indicate a potential emergency and should be followed up as soon as possible.  Feel free to call the clinic should you have any questions or concerns. The clinic phone number is [phone].  Please show the CHEMO ALERT CARD at check-in to the Emergency Department and triage nurse.   

## 2018-10-31 ENCOUNTER — Inpatient Hospital Stay (HOSPITAL_COMMUNITY): Payer: Medicare Other

## 2018-10-31 ENCOUNTER — Other Ambulatory Visit: Payer: Self-pay

## 2018-10-31 VITALS — BP 107/61 | HR 76 | Temp 98.0°F | Resp 18

## 2018-10-31 DIAGNOSIS — Z95828 Presence of other vascular implants and grafts: Secondary | ICD-10-CM

## 2018-10-31 DIAGNOSIS — D46Z Other myelodysplastic syndromes: Secondary | ICD-10-CM

## 2018-10-31 DIAGNOSIS — Z5111 Encounter for antineoplastic chemotherapy: Secondary | ICD-10-CM | POA: Diagnosis not present

## 2018-10-31 MED ORDER — HEPARIN SOD (PORK) LOCK FLUSH 100 UNIT/ML IV SOLN
500.0000 [IU] | Freq: Once | INTRAVENOUS | Status: AC | PRN
  Administered 2018-10-31: 500 [IU]

## 2018-10-31 MED ORDER — SODIUM CHLORIDE 0.9% FLUSH
10.0000 mL | INTRAVENOUS | Status: DC | PRN
  Administered 2018-10-31: 10 mL
  Filled 2018-10-31: qty 10

## 2018-10-31 MED ORDER — SODIUM CHLORIDE 0.9 % IV SOLN
Freq: Once | INTRAVENOUS | Status: AC
  Administered 2018-10-31: 10:00:00 via INTRAVENOUS

## 2018-10-31 MED ORDER — SODIUM CHLORIDE 0.9 % IV SOLN
100.0000 mg | Freq: Once | INTRAVENOUS | Status: AC
  Administered 2018-10-31: 100 mg via INTRAVENOUS
  Filled 2018-10-31: qty 10

## 2018-10-31 NOTE — Progress Notes (Signed)
Treatment given today per MD orders. Tolerated infusion without adverse affects. Vital signs stable. No complaints at this time. Discharged from clinic ambulatory. F/U with [facility] as scheduled.   

## 2018-11-01 ENCOUNTER — Inpatient Hospital Stay (HOSPITAL_COMMUNITY): Payer: Medicare Other

## 2018-11-01 ENCOUNTER — Encounter (HOSPITAL_COMMUNITY): Payer: Self-pay

## 2018-11-01 VITALS — BP 125/63 | HR 83 | Temp 98.3°F | Resp 18

## 2018-11-01 DIAGNOSIS — D46Z Other myelodysplastic syndromes: Secondary | ICD-10-CM

## 2018-11-01 DIAGNOSIS — Z5111 Encounter for antineoplastic chemotherapy: Secondary | ICD-10-CM | POA: Diagnosis not present

## 2018-11-01 DIAGNOSIS — Z95828 Presence of other vascular implants and grafts: Secondary | ICD-10-CM

## 2018-11-01 MED ORDER — SODIUM CHLORIDE 0.9 % IV SOLN
Freq: Once | INTRAVENOUS | Status: AC
  Administered 2018-11-01: 11:00:00 via INTRAVENOUS

## 2018-11-01 MED ORDER — SODIUM CHLORIDE 0.9% FLUSH
10.0000 mL | INTRAVENOUS | Status: DC | PRN
  Administered 2018-11-01: 11:00:00 10 mL
  Filled 2018-11-01: qty 10

## 2018-11-01 MED ORDER — PALONOSETRON HCL INJECTION 0.25 MG/5ML
0.2500 mg | Freq: Once | INTRAVENOUS | Status: AC
  Administered 2018-11-01: 0.25 mg via INTRAVENOUS
  Filled 2018-11-01: qty 5

## 2018-11-01 MED ORDER — SODIUM CHLORIDE 0.9 % IV SOLN
100.0000 mg | Freq: Once | INTRAVENOUS | Status: AC
  Administered 2018-11-01: 100 mg via INTRAVENOUS
  Filled 2018-11-01: qty 10

## 2018-11-01 MED ORDER — HEPARIN SOD (PORK) LOCK FLUSH 100 UNIT/ML IV SOLN
500.0000 [IU] | Freq: Once | INTRAVENOUS | Status: AC | PRN
  Administered 2018-11-01: 500 [IU]

## 2018-11-01 NOTE — Progress Notes (Signed)
[name] tolerated Vidaza infusion well without complaints or incident. VSS upon discharge. Pt discharged self ambulatory in satisfactory condition

## 2018-11-01 NOTE — Patient Instructions (Signed)
[facility] Discharge Instructions for Patients Receiving Chemotherapy   Beginning January 23rd 2017 lab work for the Cancer Center will be done in the  Main lab at [place] on 1st floor. If you have a lab appointment with the Cancer Center please come in thru the  Main Entrance and check in at the main information desk   Today you received the following chemotherapy agents Vidaza. Follow up as scheduled. Call clinic for any questions or concerns.   To help prevent nausea and vomiting after your treatment, we encourage you to take your nausea medication   If you develop nausea and vomiting, or diarrhea that is not controlled by your medication, call the clinic.  The clinic phone number is [phone]. Office hours are Monday-Friday 8:30am-5:00pm.  BELOW ARE SYMPTOMS THAT SHOULD BE REPORTED IMMEDIATELY:  *FEVER GREATER THAN 101.0 F  *CHILLS WITH OR WITHOUT FEVER  NAUSEA AND VOMITING THAT IS NOT CONTROLLED WITH YOUR NAUSEA MEDICATION  *UNUSUAL SHORTNESS OF BREATH  *UNUSUAL BRUISING OR BLEEDING  TENDERNESS IN MOUTH AND THROAT WITH OR WITHOUT PRESENCE OF ULCERS  *URINARY PROBLEMS  *BOWEL PROBLEMS  UNUSUAL RASH Items with * indicate a potential emergency and should be followed up as soon as possible. If you have an emergency after office hours please contact your primary care physician or go to the nearest emergency department.  Please call the clinic during office hours if you have any questions or concerns.   You may also contact the Patient Navigator at [phone] should you have any questions or need assistance in obtaining follow up care.      Resources For Cancer Patients and their Caregivers ? American Cancer Society: Can assist with transportation, wigs, general needs, runs Look Good Feel Better.        1-888-227-6333 ? Cancer Care: Provides financial assistance, online support groups, medication/co-pay assistance.  1-800-813-HOPE  (4673) ? [facility] Assists [place] cancer patients and their families through emotional , educational and financial support.  336-427-4357 ? [place] DSS Where to apply for food stamps, Medicaid and utility assistance. 336-342-1394 ? [facility]: Transportation to medical appointments. 336-347-2287 ? Social Security Administration: May apply for disability if have a Stage IV cancer. 336-342-7796 1-800-772-1213 ? [place] Aging, Disability and Transit Services: Assists with nutrition, care and transit needs. 336-349-2343          

## 2018-11-04 ENCOUNTER — Other Ambulatory Visit: Payer: Self-pay

## 2018-11-04 ENCOUNTER — Inpatient Hospital Stay (HOSPITAL_COMMUNITY): Payer: Medicare Other

## 2018-11-04 ENCOUNTER — Encounter (HOSPITAL_COMMUNITY): Payer: Self-pay

## 2018-11-04 VITALS — BP 135/72 | HR 75 | Temp 97.5°F | Resp 16

## 2018-11-04 DIAGNOSIS — D46Z Other myelodysplastic syndromes: Secondary | ICD-10-CM

## 2018-11-04 DIAGNOSIS — Z95828 Presence of other vascular implants and grafts: Secondary | ICD-10-CM

## 2018-11-04 DIAGNOSIS — Z5111 Encounter for antineoplastic chemotherapy: Secondary | ICD-10-CM | POA: Diagnosis not present

## 2018-11-04 LAB — CBC WITH DIFFERENTIAL/PLATELET
Abs Immature Granulocytes: 0 10*3/uL (ref 0.00–0.07)
Basophils Absolute: 0 10*3/uL (ref 0.0–0.1)
Basophils Relative: 0 %
Eosinophils Absolute: 0 10*3/uL (ref 0.0–0.5)
Eosinophils Relative: 0 %
HCT: 25.9 % — ABNORMAL LOW (ref 39.0–52.0)
Hemoglobin: 7.5 g/dL — ABNORMAL LOW (ref 13.0–17.0)
Immature Granulocytes: 0 %
Lymphocytes Relative: 74 %
Lymphs Abs: 0.7 10*3/uL (ref 0.7–4.0)
MCH: 31.1 pg (ref 26.0–34.0)
MCHC: 29 g/dL — ABNORMAL LOW (ref 30.0–36.0)
MCV: 107.5 fL — ABNORMAL HIGH (ref 80.0–100.0)
Monocytes Absolute: 0.1 10*3/uL (ref 0.1–1.0)
Monocytes Relative: 7 %
Neutro Abs: 0.2 10*3/uL — ABNORMAL LOW (ref 1.7–7.7)
Neutrophils Relative %: 19 %
Platelets: 48 10*3/uL — ABNORMAL LOW (ref 150–400)
RBC: 2.41 MIL/uL — ABNORMAL LOW (ref 4.22–5.81)
RDW: 18.5 % — ABNORMAL HIGH (ref 11.5–15.5)
WBC: 1 10*3/uL — CL (ref 4.0–10.5)
nRBC: 3 % — ABNORMAL HIGH (ref 0.0–0.2)

## 2018-11-04 LAB — COMPREHENSIVE METABOLIC PANEL
ALT: 24 U/L (ref 0–44)
AST: 25 U/L (ref 15–41)
Albumin: 3.7 g/dL (ref 3.5–5.0)
Alkaline Phosphatase: 54 U/L (ref 38–126)
Anion gap: 11 (ref 5–15)
BUN: 35 mg/dL — ABNORMAL HIGH (ref 8–23)
CO2: 25 mmol/L (ref 22–32)
Calcium: 9.1 mg/dL (ref 8.9–10.3)
Chloride: 104 mmol/L (ref 98–111)
Creatinine, Ser: 1.51 mg/dL — ABNORMAL HIGH (ref 0.61–1.24)
GFR calc Af Amer: 52 mL/min — ABNORMAL LOW (ref >60)
GFR calc non Af Amer: 45 mL/min — ABNORMAL LOW (ref >60)
Glucose, Bld: 136 mg/dL — ABNORMAL HIGH (ref 70–99)
Potassium: 4.6 mmol/L (ref 3.5–5.1)
Sodium: 140 mmol/L (ref 135–145)
Total Bilirubin: 1 mg/dL (ref 0.3–1.2)
Total Protein: 6.1 g/dL — ABNORMAL LOW (ref 6.5–8.1)

## 2018-11-04 LAB — PREPARE RBC (CROSSMATCH)

## 2018-11-04 MED ORDER — DIPHENHYDRAMINE HCL 25 MG PO CAPS
25.0000 mg | ORAL_CAPSULE | Freq: Once | ORAL | Status: AC
  Administered 2018-11-04: 25 mg via ORAL
  Filled 2018-11-04: qty 1

## 2018-11-04 MED ORDER — SODIUM CHLORIDE 0.9 % IV SOLN
Freq: Once | INTRAVENOUS | Status: AC
  Administered 2018-11-04: 11:00:00 via INTRAVENOUS

## 2018-11-04 MED ORDER — SODIUM CHLORIDE 0.9% FLUSH
10.0000 mL | INTRAVENOUS | Status: DC | PRN
  Administered 2018-11-04: 10 mL
  Filled 2018-11-04: qty 10

## 2018-11-04 MED ORDER — ACETAMINOPHEN 325 MG PO TABS
650.0000 mg | ORAL_TABLET | Freq: Once | ORAL | Status: AC
  Administered 2018-11-04: 650 mg via ORAL
  Filled 2018-11-04: qty 2

## 2018-11-04 MED ORDER — HEPARIN SOD (PORK) LOCK FLUSH 100 UNIT/ML IV SOLN: 250.0000 [IU] | INTRAVENOUS | Status: DC | PRN

## 2018-11-04 MED ORDER — PALONOSETRON HCL INJECTION 0.25 MG/5ML
0.2500 mg | Freq: Once | INTRAVENOUS | Status: AC
  Administered 2018-11-04: 0.25 mg via INTRAVENOUS
  Filled 2018-11-04: qty 5

## 2018-11-04 MED ORDER — HEPARIN SOD (PORK) LOCK FLUSH 100 UNIT/ML IV SOLN
500.0000 [IU] | Freq: Once | INTRAVENOUS | Status: AC | PRN
  Administered 2018-11-04: 500 [IU]

## 2018-11-04 MED ORDER — SODIUM CHLORIDE 0.9 % IV SOLN
100.0000 mg | Freq: Once | INTRAVENOUS | Status: AC
  Administered 2018-11-04: 100 mg via INTRAVENOUS
  Filled 2018-11-04: qty 10

## 2018-11-04 MED ORDER — SODIUM CHLORIDE 0.9% IV SOLUTION
250.0000 mL | Freq: Once | INTRAVENOUS | Status: AC
  Administered 2018-11-04: 250 mL via INTRAVENOUS

## 2018-11-04 MED ORDER — SODIUM CHLORIDE 0.9% FLUSH: 10.0000 mL | INTRAVENOUS | Status: AC | PRN

## 2018-11-04 NOTE — Progress Notes (Signed)
Patient here today for day 6, stated he is feeling little more dizzy at times, when he gets up and when he goes up stairs. MD aware.   Labs reviewed with MD. Hemoglobin 7.5, WBC and ANC noted by MD. Proceed with treatment today per MD. Will give one unit of blood per orders.    One unit of blood given today per orders.  Treatment given per orders. Patient tolerated it well without problems. Vitals stable and discharged home from clinic ambulatory. Follow up as scheduled.

## 2018-11-04 NOTE — Progress Notes (Signed)
CRITICAL VALUE ALERT  Critical Value:  WBC 1.0  Date & Time Notied:  11/04/2018 at 0952  Provider Notified: Dr. Delton Coombes  Orders Received/Actions taken: n/a

## 2018-11-04 NOTE — Patient Instructions (Signed)
[facility] Discharge Instructions for Patients Receiving Chemotherapy   One unit of blood today as well Today you received the following chemotherapy agents   To help prevent nausea and vomiting after your treatment, we encourage you to take your nausea medication    If you develop nausea and vomiting that is not controlled by your nausea medication, call the clinic.   BELOW ARE SYMPTOMS THAT SHOULD BE REPORTED IMMEDIATELY:  *FEVER GREATER THAN 100.5 F  *CHILLS WITH OR WITHOUT FEVER  NAUSEA AND VOMITING THAT IS NOT CONTROLLED WITH YOUR NAUSEA MEDICATION  *UNUSUAL SHORTNESS OF BREATH  *UNUSUAL BRUISING OR BLEEDING  TENDERNESS IN MOUTH AND THROAT WITH OR WITHOUT PRESENCE OF ULCERS  *URINARY PROBLEMS  *BOWEL PROBLEMS  UNUSUAL RASH Items with * indicate a potential emergency and should be followed up as soon as possible.  Feel free to call the clinic should you have any questions or concerns. The clinic phone number is [phone].  Please show the [place] at check-in to the Emergency Department and triage nurse.

## 2018-11-05 ENCOUNTER — Inpatient Hospital Stay (HOSPITAL_COMMUNITY): Payer: Medicare Other

## 2018-11-05 VITALS — BP 128/77 | HR 71 | Temp 98.3°F | Resp 18

## 2018-11-05 DIAGNOSIS — Z5111 Encounter for antineoplastic chemotherapy: Secondary | ICD-10-CM | POA: Diagnosis not present

## 2018-11-05 DIAGNOSIS — D46Z Other myelodysplastic syndromes: Secondary | ICD-10-CM

## 2018-11-05 DIAGNOSIS — Z95828 Presence of other vascular implants and grafts: Secondary | ICD-10-CM

## 2018-11-05 LAB — TYPE AND SCREEN
ABO/RH(D): O POS
Antibody Screen: NEGATIVE
Unit division: 0

## 2018-11-05 LAB — BPAM RBC
Blood Product Expiration Date: 202007232359
ISSUE DATE / TIME: 202006291217
Unit Type and Rh: 5100

## 2018-11-05 MED ORDER — HEPARIN SOD (PORK) LOCK FLUSH 100 UNIT/ML IV SOLN
500.0000 [IU] | Freq: Once | INTRAVENOUS | Status: AC | PRN
  Administered 2018-11-05: 500 [IU]

## 2018-11-05 MED ORDER — SODIUM CHLORIDE 0.9 % IV SOLN
Freq: Once | INTRAVENOUS | Status: AC
  Administered 2018-11-05: 09:00:00 via INTRAVENOUS

## 2018-11-05 MED ORDER — PALONOSETRON HCL INJECTION 0.25 MG/5ML: 0.2500 mg | Freq: Once | INTRAVENOUS | Status: DC

## 2018-11-05 MED ORDER — SODIUM CHLORIDE 0.9% FLUSH
10.0000 mL | INTRAVENOUS | Status: DC | PRN
  Administered 2018-11-05: 10 mL
  Filled 2018-11-05: qty 10

## 2018-11-05 MED ORDER — SODIUM CHLORIDE 0.9 % IV SOLN
100.0000 mg | Freq: Once | INTRAVENOUS | Status: AC
  Administered 2018-11-05: 100 mg via INTRAVENOUS
  Filled 2018-11-05: qty 10

## 2018-11-05 NOTE — Progress Notes (Signed)
Patient here today for treatment, patient states he feels better today than yesterday, dizziness is better today.   Proceed with day 7 as planned.

## 2018-11-05 NOTE — Patient Instructions (Signed)
[facility] Discharge Instructions for Patients Receiving Chemotherapy  Today you received the following chemotherapy agents   To help prevent nausea and vomiting after your treatment, we encourage you to take your nausea medication   If you develop nausea and vomiting that is not controlled by your nausea medication, call the clinic.   BELOW ARE SYMPTOMS THAT SHOULD BE REPORTED IMMEDIATELY:  *FEVER GREATER THAN 100.5 F  *CHILLS WITH OR WITHOUT FEVER  NAUSEA AND VOMITING THAT IS NOT CONTROLLED WITH YOUR NAUSEA MEDICATION  *UNUSUAL SHORTNESS OF BREATH  *UNUSUAL BRUISING OR BLEEDING  TENDERNESS IN MOUTH AND THROAT WITH OR WITHOUT PRESENCE OF ULCERS  *URINARY PROBLEMS  *BOWEL PROBLEMS  UNUSUAL RASH Items with * indicate a potential emergency and should be followed up as soon as possible.  Feel free to call the clinic should you have any questions or concerns. The clinic phone number is [phone].  Please show the CHEMO ALERT CARD at check-in to the Emergency Department and triage nurse.   

## 2018-11-05 NOTE — Progress Notes (Signed)
Treatment given per orders. Patient tolerated it well without problems. Vitals stable and discharged home from clinic ambulatory. Follow up as scheduled.  

## 2018-11-11 ENCOUNTER — Inpatient Hospital Stay (HOSPITAL_COMMUNITY): Payer: Medicare Other

## 2018-11-11 ENCOUNTER — Inpatient Hospital Stay (HOSPITAL_COMMUNITY): Payer: Medicare Other | Attending: Internal Medicine | Admitting: Hematology

## 2018-11-11 ENCOUNTER — Other Ambulatory Visit: Payer: Self-pay

## 2018-11-11 ENCOUNTER — Encounter (HOSPITAL_COMMUNITY): Payer: Self-pay | Admitting: Hematology

## 2018-11-11 VITALS — BP 126/68 | HR 82 | Temp 97.9°F | Resp 14 | Wt 230.8 lb

## 2018-11-11 DIAGNOSIS — R17 Unspecified jaundice: Secondary | ICD-10-CM | POA: Diagnosis not present

## 2018-11-11 DIAGNOSIS — M25569 Pain in unspecified knee: Secondary | ICD-10-CM | POA: Diagnosis not present

## 2018-11-11 DIAGNOSIS — M549 Dorsalgia, unspecified: Secondary | ICD-10-CM | POA: Diagnosis not present

## 2018-11-11 DIAGNOSIS — D701 Agranulocytosis secondary to cancer chemotherapy: Secondary | ICD-10-CM

## 2018-11-11 DIAGNOSIS — N183 Chronic kidney disease, stage 3 (moderate): Secondary | ICD-10-CM | POA: Diagnosis not present

## 2018-11-11 DIAGNOSIS — M25559 Pain in unspecified hip: Secondary | ICD-10-CM

## 2018-11-11 DIAGNOSIS — D46Z Other myelodysplastic syndromes: Secondary | ICD-10-CM | POA: Insufficient documentation

## 2018-11-11 DIAGNOSIS — Z85820 Personal history of malignant melanoma of skin: Secondary | ICD-10-CM | POA: Insufficient documentation

## 2018-11-11 DIAGNOSIS — Z5111 Encounter for antineoplastic chemotherapy: Secondary | ICD-10-CM | POA: Diagnosis present

## 2018-11-11 DIAGNOSIS — R011 Cardiac murmur, unspecified: Secondary | ICD-10-CM | POA: Diagnosis not present

## 2018-11-11 DIAGNOSIS — Z87891 Personal history of nicotine dependence: Secondary | ICD-10-CM | POA: Diagnosis not present

## 2018-11-11 DIAGNOSIS — M1990 Unspecified osteoarthritis, unspecified site: Secondary | ICD-10-CM | POA: Insufficient documentation

## 2018-11-11 DIAGNOSIS — I129 Hypertensive chronic kidney disease with stage 1 through stage 4 chronic kidney disease, or unspecified chronic kidney disease: Secondary | ICD-10-CM | POA: Diagnosis not present

## 2018-11-11 DIAGNOSIS — K219 Gastro-esophageal reflux disease without esophagitis: Secondary | ICD-10-CM | POA: Diagnosis not present

## 2018-11-11 DIAGNOSIS — Z79899 Other long term (current) drug therapy: Secondary | ICD-10-CM | POA: Insufficient documentation

## 2018-11-11 LAB — CBC WITH DIFFERENTIAL/PLATELET
Abs Immature Granulocytes: 0 10*3/uL (ref 0.00–0.07)
Basophils Absolute: 0 10*3/uL (ref 0.0–0.1)
Basophils Relative: 0 %
Eosinophils Absolute: 0 10*3/uL (ref 0.0–0.5)
Eosinophils Relative: 0 %
HCT: 30 % — ABNORMAL LOW (ref 39.0–52.0)
Hemoglobin: 8.4 g/dL — ABNORMAL LOW (ref 13.0–17.0)
Immature Granulocytes: 0 %
Lymphocytes Relative: 67 %
Lymphs Abs: 1.4 10*3/uL (ref 0.7–4.0)
MCH: 30.9 pg (ref 26.0–34.0)
MCHC: 28 g/dL — ABNORMAL LOW (ref 30.0–36.0)
MCV: 110.3 fL — ABNORMAL HIGH (ref 80.0–100.0)
Monocytes Absolute: 0.2 10*3/uL (ref 0.1–1.0)
Monocytes Relative: 10 %
Neutro Abs: 0.5 10*3/uL — ABNORMAL LOW (ref 1.7–7.7)
Neutrophils Relative %: 23 %
Platelets: 54 10*3/uL — ABNORMAL LOW (ref 150–400)
RBC: 2.72 MIL/uL — ABNORMAL LOW (ref 4.22–5.81)
RDW: 20.8 % — ABNORMAL HIGH (ref 11.5–15.5)
WBC: 2.1 10*3/uL — ABNORMAL LOW (ref 4.0–10.5)
nRBC: 4.7 % — ABNORMAL HIGH (ref 0.0–0.2)

## 2018-11-11 LAB — COMPREHENSIVE METABOLIC PANEL
ALT: 22 U/L (ref 0–44)
AST: 23 U/L (ref 15–41)
Albumin: 3.8 g/dL (ref 3.5–5.0)
Alkaline Phosphatase: 53 U/L (ref 38–126)
Anion gap: 8 (ref 5–15)
BUN: 25 mg/dL — ABNORMAL HIGH (ref 8–23)
CO2: 24 mmol/L (ref 22–32)
Calcium: 9.2 mg/dL (ref 8.9–10.3)
Chloride: 110 mmol/L (ref 98–111)
Creatinine, Ser: 1.31 mg/dL — ABNORMAL HIGH (ref 0.61–1.24)
GFR calc Af Amer: >60 mL/min (ref >60)
GFR calc non Af Amer: 54 mL/min — ABNORMAL LOW (ref >60)
Glucose, Bld: 111 mg/dL — ABNORMAL HIGH (ref 70–99)
Potassium: 5.4 mmol/L — ABNORMAL HIGH (ref 3.5–5.1)
Sodium: 142 mmol/L (ref 135–145)
Total Bilirubin: 0.9 mg/dL (ref 0.3–1.2)
Total Protein: 6.3 g/dL — ABNORMAL LOW (ref 6.5–8.1)

## 2018-11-11 MED ORDER — TRAMADOL HCL 50 MG PO TABS: 50.0000 mg | ORAL_TABLET | Freq: Three times a day (TID) | ORAL | 0 refills | Status: DC | PRN

## 2018-11-11 NOTE — Assessment & Plan Note (Addendum)
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  Nutritional work-up was negative. 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  Flow cytometry shows 12% blasts. 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  FISH panel is pending. 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 - Cycle 1 of Vidaza 50 mg per metered square on 08/22/2018, 1 unit of platelets on 09/09/2018. -Cycle 2 of Vidaza on 09/23/2018, 1 unit PRBC on 10/01/2018 for hemoglobin of 6.5. -Cycle 3 on 10/28/2018.  Received 1 unit of PRBC on 11/04/2018. -Labs today showed white count of 2.1, hemoglobin of 8.4, platelet count of 54. - He does not require any blood transfusion or platelet transfusion today. -However he had many questions about his prognosis.  We had a prolonged discussion again about the prognosis of MDS and the need for treatment with for at least 6 cycles to see therapy response. -We will see him back in 2 weeks prior to cycle 4.  2.  CKD: -Creatinine is 1.31.  We will continue to closely monitor.  3.  Back pain: -He will continue using tramadol 50 mg daily.  He was told to not increase Celebrex.  He is taking once a day.

## 2018-11-11 NOTE — Patient Instructions (Addendum)
[facility] at [facility] Discharge Instructions  You were seen today by Dr. Katragadda. He went over your recent lab results. He will see you back in 2 weeks for labs and follow up.   Thank you for choosing [facility] at [facility] to provide your oncology and hematology care.  To afford each patient quality time with our provider, please arrive at least 15 minutes before your scheduled appointment time.   If you have a lab appointment with the Cancer Center please come in thru the  Main Entrance and check in at the main information desk  You need to re-schedule your appointment should you arrive 10 or more minutes late.  We strive to give you quality time with our providers, and arriving late affects you and other patients whose appointments are after yours.  Also, if you no show three or more times for appointments you may be dismissed from the clinic at the providers discretion.     Again, thank you for choosing [facility].  Our hope is that these requests will decrease the amount of time that you wait before being seen by our physicians.       _____________________________________________________________  Should you have questions after your visit to [facility], please contact our office at [phone] between the hours of 8:00 a.m. and 4:30 p.m.  Voicemails left after 4:00 p.m. will not be returned until the following business day.  For prescription refill requests, have your pharmacy contact our office and allow 72 hours.    Cancer Center Support Programs:   > Cancer Support Group  2nd Tuesday of the month 1pm-2pm, Journey Room    

## 2018-11-11 NOTE — Progress Notes (Signed)
Richard Wallace, [name] [address]   CLINIC:  Medical Oncology/Hematology  PCP:  [name], MD [address] [place] [address] [phone]   REASON FOR VISIT:  Follow-up for MDS  CURRENT THERAPY: Vidaza  BRIEF ONCOLOGIC HISTORY:  Oncology History  MDS (myelodysplastic syndrome), high grade ([place])  08/14/2018 Initial Diagnosis   MDS (myelodysplastic syndrome), high grade ([place])   08/22/2018 -  Chemotherapy   The patient had palonosetron (ALOXI) injection 0.25 mg, 0.25 mg, Intravenous,  Once, 3 of 6 cycles Administration: 0.25 mg (08/22/2018), 0.25 mg (08/26/2018), 0.25 mg (08/28/2018), 0.25 mg (08/30/2018), 0.25 mg (10/01/2018), 0.25 mg (10/02/2018), 0.25 mg (11/04/2018), 0.25 mg (09/23/2018), 0.25 mg (09/25/2018), 0.25 mg (09/27/2018), 0.25 mg (10/28/2018), 0.25 mg (10/30/2018), 0.25 mg (11/01/2018) azaCITIDine (VIDAZA) 100 mg in sodium chloride 0.9 % 50 mL chemo infusion, 110 mg (66.7 % of original dose 75 mg/m2), Intravenous, Once, 3 of 6 cycles Dose modification: 50 mg/m2 (original dose 75 mg/m2, Cycle 1, Reason: Provider Judgment), 50 mg/m2 (original dose 75 mg/m2, Cycle 2, Reason: Provider Judgment) Administration: 100 mg (08/22/2018), 100 mg (08/23/2018), 100 mg (08/26/2018), 100 mg (08/27/2018), 100 mg (08/28/2018), 100 mg (08/29/2018), 100 mg (08/30/2018), 100 mg (10/01/2018), 100 mg (10/02/2018), 100 mg (11/04/2018), 100 mg (11/05/2018), 100 mg (09/23/2018), 100 mg (09/24/2018), 100 mg (09/25/2018), 100 mg (09/26/2018), 100 mg (09/27/2018), 100 mg (10/28/2018), 100 mg (10/29/2018), 100 mg (10/30/2018), 100 mg (10/31/2018), 100 mg (11/01/2018)  for chemotherapy treatment.       CANCER STAGING: Cancer Staging No matching staging information was found for the patient.   INTERVAL HISTORY:  [name] 73 y.o. male presents for follow-up of MDS.  Cycle 3 was given on 10/28/2018.  He also received 1 unit PRBC on 11/04/2018. Denies any major dizziness at this time.   Swelling in the legs have been stable.  He is having back pain and knee pain.  Fatigue is also stable.  Appetite is 100%.  Energy levels are 25%.  Denies any nausea, vomiting, diarrhea or constipation.  Denies any ER visits or hospitalizations.  REVIEW OF SYSTEMS:  Review of Systems  Cardiovascular: Positive for leg swelling.  Musculoskeletal: Positive for arthralgias and back pain.  Neurological: Positive for dizziness.  All other systems reviewed and are negative.    PAST MEDICAL/SURGICAL HISTORY:  Past Medical History:  Diagnosis Date  . Arthritis   . 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 Cancer ([place]) 01-28-2014   skin cancer  . CKD (chronic kidney disease), stage III ([place])   . GERD (gastroesophageal reflux disease)   . Heart murmur    NOTED DURING PHYSICAL WHEN HE WAS ENLISTING IN [place]ARY , DIDNT KNOW UNTIL THAT TIME AND REPORTS , "THATS THE LAST I HEARD ABOUT IT "   . History of hypertension    no longer issue  . History of kidney stones   . History of malignant melanoma of skin    excision top of scalp 2015-- no recurrence  . History of urinary retention    post op lumbar fusion surgery 04/ 2016  . Hypertension   . 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 Left ureteral calculus   . Seasonal allergies   . Wears glasses   . Wears glasses   . Wears partial dentures    upper and lower   Past Surgical History:  Procedure Laterality Date  .  ANKLE FUSION Right 2007  . CARPAL TUNNEL RELEASE Left 12/28/2009   w/ pulley release left long finger  . CARPAL TUNNEL RELEASE Right 07/22/2013   Procedure: RIGHT CARPAL TUNNEL RELEASE;  Surgeon: Cammie Sickle., MD;  Location: [facility];  Service: Orthopedics;  Laterality: Right;  . COLONOSCOPY    . CYSTO/  LEFT RETROGRADE PYELOGRAM  11/21/2010  . CYSTOSCOPY WITH STENT PLACEMENT Left 03/09/2016   Procedure: CYSTOSCOPY WITH STENT  PLACEMENT;  Surgeon: Franchot Gallo, MD;  Location[facility];  Service: Urology;  Laterality: Left;  . CYSTOSCOPY/RETROGRADE/URETEROSCOPY/STONE EXTRACTION WITH BASKET Left 03/09/2016   Procedure: CYSTOSCOPY/RETROGRADE/URETEROSCOPY/STONE EXTRACTION WITH BASKET;  Surgeon: Franchot Gallo, MD;  Location: [facility];  Service: Urology;  Laterality: Left;  . LEFT URETEROSCOPIC LASER LITHOTRIPSY STONE EXTRACTION/ STENT PLACEMENT  05/23/2010  . MOHS SURGERY     TOP OF THE HEAD   . ORIF ANKLE FRACTURE Right 1978  . PORTACATH PLACEMENT Right 08/19/2018   Procedure: INSERTION PORT-A-CATH (attached catheter in right subclavian);  Surgeon: Aviva Signs, MD;  Location: [facility];  Service: General;  Laterality: Right;  . POSTERIOR LUMBAR FUSION  08/21/2014   laminectomy and decompression L2 -- L5  . RIGHT LOWER LEG SURGERY  X3  1975 to 1976   including ORIF  . TONSILLECTOMY AND ADENOIDECTOMY  1986  . UMBILICAL HERNIA REPAIR  2009 approx     SOCIAL HISTORY:  Social History   Socioeconomic History  . Marital status: Married    Spouse name: Not on file  . Number of children: Not on file  . Years of education: Not on file  . Highest education level: Not on file  Occupational History  . Not on file  Social Needs  . Financial resource strain: Not on file  . Food insecurity    Worry: Not on file    Inability: Not on file  . Transportation needs    Medical: Not on file    Non-medical: Not on file  Tobacco Use  . Smoking status: Former Smoker    Years: 20.00    Types: Cigarettes    Quit date: 07/17/1986    Years since quitting: 32.3  . Smokeless tobacco: Never Used  Substance and Sexual Activity  . Alcohol use: Yes    Alcohol/week: 7.0 - 14.0 standard drinks    Types: 7 - 14 Cans of beer per week    Comment: 1 -2 beer daily  . Drug use: No  . Sexual activity: Not on file  Lifestyle  . Physical activity    Days per week: Not on file    Minutes per  session: Not on file  . Stress: Not on file  Relationships  . Social Herbalist on phone: Not on file    Gets together: Not on file    Attends religious service: Not on file    Active member of club or organization: Not on file    Attends meetings of clubs or organizations: Not on file    Relationship status: Not on file  . Intimate partner violence    Fear of current or ex partner: Not on file    Emotionally abused: Not on file    Physically abused: Not on file    Forced sexual activity: Not on file  Other Topics Concern  . Not on file  Social History Narrative  . Not on file    FAMILY HISTORY:  Family History  Problem Relation  Age of Onset  . Stroke Mother   . Prostate cancer Father   . Bone cancer Father   . Rheum arthritis Sister   . Urinary tract infection Sister     CURRENT MEDICATIONS:  Outpatient Encounter Medications as of 11/11/2018  Medication Sig  . acetaminophen (TYLENOL) 500 MG tablet Take 1,000 mg by mouth every 6 (six) hours as needed for mild pain or moderate pain.   Marland Kitchen allopurinol (ZYLOPRIM) 300 MG tablet Take 300 mg by mouth every evening.  Marland Kitchen azaCITIDine 5 mg/2 mLs in lactated ringers infusion Inject into the vein daily. Day 1-7 every 28 days (starting 08/22/2018)  . celecoxib (CELEBREX) 200 MG capsule Take 200 mg by mouth daily. IN THE MORNING  . cetirizine (ZYRTEC) 10 MG tablet Take 10 mg by mouth daily. IN THE MORNING  . diclofenac sodium (VOLTAREN) 1 % GEL Apply 1 g topically 3 (three) times daily as needed (knee pain.).   Marland Kitchen docusate sodium (COLACE) 100 MG capsule Take 100 mg by mouth at bedtime.   . fluticasone (FLONASE) 50 MCG/ACT nasal spray Place 1 spray into both nostrils daily. In the morning  . lansoprazole (PREVACID) 15 MG capsule Take 15 mg by mouth daily before breakfast.   . lidocaine-prilocaine (EMLA) cream Apply pea-sized amount to port a cath site and cover with plastic wrap one hour prior to chemotherapy appointment  . lisinopril  (PRINIVIL,ZESTRIL) 5 MG tablet Take 2.5 mg by mouth daily. In the morning  . magnesium oxide (MAG-OX) 400 MG tablet Take 400 mg by mouth at bedtime.   Vladimir Faster Glycol-Propyl Glycol (SYSTANE OP) Place 1 drop into both eyes 2 (two) times daily.  . prochlorperazine (COMPAZINE) 10 MG tablet Take 1 tablet (10 mg total) by mouth every 6 (six) hours as needed (Nausea or vomiting).  . tamsulosin (FLOMAX) 0.4 MG CAPS capsule Take 0.4 mg by mouth every evening.   . traMADol (ULTRAM) 50 MG tablet Take 1 tablet (50 mg total) by mouth 3 (three) times daily as needed (pain.).  . [DISCONTINUED] HYDROcodone-acetaminophen (NORCO) 5-325 MG tablet Take 1 tablet by mouth every 4 (four) hours as needed for moderate pain. (Patient not taking: Reported on 11/11/2018)  . [DISCONTINUED] traMADol (ULTRAM) 50 MG tablet Take 50 mg by mouth 3 (three) times daily as needed (pain.).    No facility-administered encounter medications on file as of 11/11/2018.     ALLERGIES:  No Known Allergies   PHYSICAL EXAM:  ECOG Performance status: 1  Vitals:   11/11/18 1019  BP: 126/68  Pulse: 82  Resp: 14  Temp: 97.9 F (36.6 C)  SpO2: 98%   Filed Weights   11/11/18 1019  Weight: 230 lb 12.8 oz (104.7 kg)    Physical Exam Constitutional:      Appearance: Normal appearance. He is obese.  HENT:     Nose: Nose normal.     Mouth/Throat:     Mouth: Mucous membranes are moist.     Pharynx: Oropharynx is clear.  Eyes:     Extraocular Movements: Extraocular movements intact.     Conjunctiva/sclera: Conjunctivae normal.  Neck:     Musculoskeletal: Normal range of motion.  Cardiovascular:     Rate and Rhythm: Normal rate and regular rhythm.     Pulses: Normal pulses.     Heart sounds: Normal heart sounds.  Pulmonary:     Effort: Pulmonary effort is normal.     Breath sounds: Normal breath sounds.  Abdominal:     General:  Bowel sounds are normal.     Palpations: Abdomen is soft.  Musculoskeletal: Normal range of  motion.        General: No deformity.  Skin:    General: Skin is warm.  Neurological:     General: No focal deficit present.     Mental Status: He is alert and oriented to person, place, and time.  Psychiatric:        Mood and Affect: Mood normal.        Behavior: Behavior normal.        Thought Content: Thought content normal.        Judgment: Judgment normal.      LABORATORY DATA:  I have reviewed the labs as listed.  CBC    Component Value Date/Time   WBC 2.1 (L) 11/11/2018 0952   RBC 2.72 (L) 11/11/2018 0952   HGB 8.4 (L) 11/11/2018 0952   HCT 30.0 (L) 11/11/2018 0952   PLT 54 (L) 11/11/2018 0952   MCV 110.3 (H) 11/11/2018 0952   MCH 30.9 11/11/2018 0952   MCHC 28.0 (L) 11/11/2018 0952   RDW 20.8 (H) 11/11/2018 0952   LYMPHSABS 1.4 11/11/2018 0952   MONOABS 0.2 11/11/2018 0952   EOSABS 0.0 11/11/2018 0952   BASOSABS 0.0 11/11/2018 0952   CMP Latest Ref Rng & Units 11/11/2018 11/04/2018 10/28/2018  Glucose 70 - 99 mg/dL 111(H) 136(H) 115(H)  BUN 8 - 23 mg/dL 25(H) 35(H) 24(H)  Creatinine 0.61 - 1.24 mg/dL 1.31(H) 1.51(H) 1.33(H)  Sodium 135 - 145 mmol/L 142 140 140  Potassium 3.5 - 5.1 mmol/L 5.4(H) 4.6 4.1  Chloride 98 - 111 mmol/L 110 104 108  CO2 22 - 32 mmol/L _0 Calcium 8.9 - 10.3 mg/dL 9.2 9.1 9.2  Total Protein 6.5 - 8.1 g/dL 6.3(L) 6.1(L) 6.3(L)  Total Bilirubin 0.3 - 1.2 mg/dL 0.9 1.0 1.1  Alkaline Phos 38 - 126 U/L 53 54 51  AST 15 - 41 U/L _1 ALT 0 - 44 U/L _2 ASSESSMENT & PLAN:   MDS (myelodysplastic syndrome), high grade ([facility]) 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  Nutritional work-up was negative. 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  Flow cytometry shows 12% blasts. 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  FISH panel is pending. 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 - Cycle 1 of Vidaza 50 mg per metered square on 08/22/2018, 1 unit of platelets on 09/09/2018. -Cycle 2 of Vidaza on 09/23/2018, 1 unit PRBC on 10/01/2018 for hemoglobin of 6.5. -Cycle 3 on 10/28/2018.  Received 1 unit of PRBC on 11/04/2018. -Labs today showed white count of 2.1, hemoglobin of 8.4, platelet count of 54. - He does not require any blood transfusion or platelet transfusion today. -However he had many questions about his prognosis.  We had a prolonged discussion again about the prognosis of MDS and the need for treatment with for at least 6 cycles to see therapy response. -We will see him back in 2 weeks prior to cycle 4.  2.  CKD: -Creatinine is 1.31.  We will continue to closely monitor.  3.  Back pain: -He will continue using tramadol 50 mg daily.  He was told to not  increase Celebrex.  He is taking once a day.     Total time spent 25 minutes with more than 50% of the time spent face-to-face discussing treatment plan, prognosis, counseling and coordination of care.  Orders placed this encounter:  No orders of the defined types were placed in this encounter.     [name], MD [place] [phone]

## 2018-11-25 ENCOUNTER — Inpatient Hospital Stay (HOSPITAL_COMMUNITY): Payer: Medicare Other

## 2018-11-25 ENCOUNTER — Inpatient Hospital Stay (HOSPITAL_BASED_OUTPATIENT_CLINIC_OR_DEPARTMENT_OTHER): Payer: Medicare Other | Admitting: Hematology

## 2018-11-25 ENCOUNTER — Encounter (HOSPITAL_COMMUNITY): Payer: Self-pay | Admitting: Hematology

## 2018-11-25 ENCOUNTER — Other Ambulatory Visit: Payer: Self-pay

## 2018-11-25 DIAGNOSIS — Z5111 Encounter for antineoplastic chemotherapy: Secondary | ICD-10-CM | POA: Diagnosis not present

## 2018-11-25 DIAGNOSIS — M549 Dorsalgia, unspecified: Secondary | ICD-10-CM

## 2018-11-25 DIAGNOSIS — D46Z Other myelodysplastic syndromes: Secondary | ICD-10-CM

## 2018-11-25 DIAGNOSIS — D61818 Other pancytopenia: Secondary | ICD-10-CM

## 2018-11-25 DIAGNOSIS — M25569 Pain in unspecified knee: Secondary | ICD-10-CM | POA: Diagnosis not present

## 2018-11-25 LAB — COMPREHENSIVE METABOLIC PANEL
ALT: 24 U/L (ref 0–44)
AST: 27 U/L (ref 15–41)
Albumin: 3.8 g/dL (ref 3.5–5.0)
Alkaline Phosphatase: 50 U/L (ref 38–126)
Anion gap: 8 (ref 5–15)
BUN: 29 mg/dL — ABNORMAL HIGH (ref 8–23)
CO2: 23 mmol/L (ref 22–32)
Calcium: 9 mg/dL (ref 8.9–10.3)
Chloride: 110 mmol/L (ref 98–111)
Creatinine, Ser: 1.52 mg/dL — ABNORMAL HIGH (ref 0.61–1.24)
GFR calc Af Amer: 52 mL/min — ABNORMAL LOW (ref >60)
GFR calc non Af Amer: 45 mL/min — ABNORMAL LOW (ref >60)
Glucose, Bld: 122 mg/dL — ABNORMAL HIGH (ref 70–99)
Potassium: 4.2 mmol/L (ref 3.5–5.1)
Sodium: 141 mmol/L (ref 135–145)
Total Bilirubin: 1.4 mg/dL — ABNORMAL HIGH (ref 0.3–1.2)
Total Protein: 6.1 g/dL — ABNORMAL LOW (ref 6.5–8.1)

## 2018-11-25 LAB — CBC WITH DIFFERENTIAL/PLATELET
Abs Immature Granulocytes: 0.01 10*3/uL (ref 0.00–0.07)
Basophils Absolute: 0 10*3/uL (ref 0.0–0.1)
Basophils Relative: 0 %
Eosinophils Absolute: 0 10*3/uL (ref 0.0–0.5)
Eosinophils Relative: 0 %
HCT: 28.5 % — ABNORMAL LOW (ref 39.0–52.0)
Hemoglobin: 8.3 g/dL — ABNORMAL LOW (ref 13.0–17.0)
Immature Granulocytes: 1 %
Lymphocytes Relative: 74 %
Lymphs Abs: 0.7 10*3/uL (ref 0.7–4.0)
MCH: 31.1 pg (ref 26.0–34.0)
MCHC: 29.1 g/dL — ABNORMAL LOW (ref 30.0–36.0)
MCV: 106.7 fL — ABNORMAL HIGH (ref 80.0–100.0)
Monocytes Absolute: 0.1 10*3/uL (ref 0.1–1.0)
Monocytes Relative: 7 %
Neutro Abs: 0.2 10*3/uL — ABNORMAL LOW (ref 1.7–7.7)
Neutrophils Relative %: 18 %
Platelets: 51 10*3/uL — ABNORMAL LOW (ref 150–400)
RBC: 2.67 MIL/uL — ABNORMAL LOW (ref 4.22–5.81)
RDW: 19 % — ABNORMAL HIGH (ref 11.5–15.5)
WBC: 1 10*3/uL — CL (ref 4.0–10.5)
nRBC: 5.1 % — ABNORMAL HIGH (ref 0.0–0.2)

## 2018-11-25 MED ORDER — HEPARIN SOD (PORK) LOCK FLUSH 100 UNIT/ML IV SOLN
500.0000 [IU] | Freq: Once | INTRAVENOUS | Status: AC
  Administered 2018-11-25: 500 [IU] via INTRAVENOUS

## 2018-11-25 MED ORDER — SODIUM CHLORIDE 0.9% FLUSH
10.0000 mL | Freq: Once | INTRAVENOUS | Status: AC
  Administered 2018-11-25: 10 mL via INTRAVENOUS

## 2018-11-25 NOTE — Progress Notes (Signed)
0900 lab work reviewed and patient seen by Dr. Delton Coombes. Treatment to be held today due to lab results. Pt to follow up next week. Port deaccessed and pt discharged self ambulatory in satisfactory condition.

## 2018-11-25 NOTE — Patient Instructions (Addendum)
[facility] at [facility] Discharge Instructions  You were seen today by Dr. Katragadda. He went over your recent lab results. He will see you back in 1 week for labs and follow up.   Thank you for choosing [facility] at [facility] to provide your oncology and hematology care.  To afford each patient quality time with our provider, please arrive at least 15 minutes before your scheduled appointment time.   If you have a lab appointment with the Cancer Center please come in thru the  Main Entrance and check in at the main information desk  You need to re-schedule your appointment should you arrive 10 or more minutes late.  We strive to give you quality time with our providers, and arriving late affects you and other patients whose appointments are after yours.  Also, if you no show three or more times for appointments you may be dismissed from the clinic at the providers discretion.     Again, thank you for choosing [facility].  Our hope is that these requests will decrease the amount of time that you wait before being seen by our physicians.       _____________________________________________________________  Should you have questions after your visit to  Cancer Center, please contact our office at [phone] between the hours of 8:00 a.m. and 4:30 p.m.  Voicemails left after 4:00 p.m. will not be returned until the following business day.  For prescription refill requests, have your pharmacy contact our office and allow 72 hours.    Cancer Center Support Programs:   > Cancer Support Group  2nd Tuesday of the month 1pm-2pm, Journey Room    

## 2018-11-25 NOTE — Progress Notes (Signed)
[place], [place] [address]   CLINIC:  Medical Oncology/Hematology  PCP:  [name], MD [address] [place] [address] [phone]   REASON FOR VISIT:  Follow-up for MDS  CURRENT THERAPY: Vidaza  BRIEF ONCOLOGIC HISTORY:  Oncology History  MDS (myelodysplastic syndrome), high grade ([facility])  08/14/2018 Initial Diagnosis   MDS (myelodysplastic syndrome), high grade ([facility])   08/22/2018 -  Chemotherapy   The patient had palonosetron (ALOXI) injection 0.25 mg, 0.25 mg, Intravenous,  Once, 3 of 6 cycles Administration: 0.25 mg (08/22/2018), 0.25 mg (08/26/2018), 0.25 mg (08/28/2018), 0.25 mg (08/30/2018), 0.25 mg (10/01/2018), 0.25 mg (10/02/2018), 0.25 mg (11/04/2018), 0.25 mg (09/23/2018), 0.25 mg (09/25/2018), 0.25 mg (09/27/2018), 0.25 mg (10/28/2018), 0.25 mg (10/30/2018), 0.25 mg (11/01/2018) azaCITIDine (VIDAZA) 100 mg in sodium chloride 0.9 % 50 mL chemo infusion, 110 mg (66.7 % of original dose 75 mg/m2), Intravenous, Once, 3 of 6 cycles Dose modification: 50 mg/m2 (original dose 75 mg/m2, Cycle 1, Reason: Provider Judgment), 50 mg/m2 (original dose 75 mg/m2, Cycle 2, Reason: Provider Judgment) Administration: 100 mg (08/22/2018), 100 mg (08/23/2018), 100 mg (08/26/2018), 100 mg (08/27/2018), 100 mg (08/28/2018), 100 mg (08/29/2018), 100 mg (08/30/2018), 100 mg (10/01/2018), 100 mg (10/02/2018), 100 mg (11/04/2018), 100 mg (11/05/2018), 100 mg (09/23/2018), 100 mg (09/24/2018), 100 mg (09/25/2018), 100 mg (09/26/2018), 100 mg (09/27/2018), 100 mg (10/28/2018), 100 mg (10/29/2018), 100 mg (10/30/2018), 100 mg (10/31/2018), 100 mg (11/01/2018)  for chemotherapy treatment.       CANCER STAGING: Cancer Staging No matching staging information was found for the patient.   INTERVAL HISTORY:  [name] 73 y.o. male seen for follow-up of MDS.  He received 1 unit of PRBC on 11/04/2018.  Denied any bleeding per rectum or melena.  Denies any nosebleeds.  Denies any fevers,  night sweats or weight loss in the last 3 weeks.  Did not experience any nausea, vomiting, diarrhea or constipation after last cycle.  Appetite is 100%.  Energy levels are low.  No pain reported.  Continues to have occasional dizziness which is stable.  Denies any ER visits or hospitalizations.  REVIEW OF SYSTEMS:  Review of Systems  Neurological: Positive for dizziness.  All other systems reviewed and are negative.    PAST MEDICAL/SURGICAL HISTORY:  Past Medical History:  Diagnosis Date  . Arthritis   . 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 Cancer ([facility]) 01-28-2014   skin cancer  . CKD (chronic kidney disease), stage III ([facility])   . GERD (gastroesophageal reflux disease)   . Heart murmur    NOTED DURING PHYSICAL WHEN HE WAS ENLISTING IN [facility] , DIDNT KNOW UNTIL THAT TIME AND REPORTS , "THATS THE LAST I HEARD ABOUT IT "   . History of hypertension    no longer issue  . History of kidney stones   . History of malignant melanoma of skin    excision top of scalp 2015-- no recurrence  . History of urinary retention    post op lumbar fusion surgery 04/ 2016  . Hypertension   . 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 Left ureteral calculus   . Seasonal allergies   . Wears glasses   . Wears glasses   . Wears partial dentures    upper and lower   Past Surgical History:  Procedure Laterality Date  . ANKLE FUSION Right 2007  .  CARPAL TUNNEL RELEASE Left 12/28/2009   w/ pulley release left long finger  . CARPAL TUNNEL RELEASE Right 07/22/2013   Procedure: RIGHT CARPAL TUNNEL RELEASE;  Surgeon: Cammie Sickle., MD;  Location: [facility];  Service: Orthopedics;  Laterality: Right;  . COLONOSCOPY    . CYSTO/  LEFT RETROGRADE PYELOGRAM  11/21/2010  . CYSTOSCOPY WITH STENT PLACEMENT Left 03/09/2016   Procedure: CYSTOSCOPY WITH STENT PLACEMENT;  Surgeon: Franchot Gallo, MD;  Location:  [facility];  Service: Urology;  Laterality: Left;  . CYSTOSCOPY/RETROGRADE/URETEROSCOPY/STONE EXTRACTION WITH BASKET Left 03/09/2016   Procedure: CYSTOSCOPY/RETROGRADE/URETEROSCOPY/STONE EXTRACTION WITH BASKET;  Surgeon: Franchot Gallo, MD;  Location: [facility];  Service: Urology;  Laterality: Left;  . LEFT URETEROSCOPIC LASER LITHOTRIPSY STONE EXTRACTION/ STENT PLACEMENT  05/23/2010  . MOHS SURGERY     TOP OF THE HEAD   . ORIF ANKLE FRACTURE Right 1978  . PORTACATH PLACEMENT Right 08/19/2018   Procedure: INSERTION PORT-A-CATH (attached catheter in right subclavian);  Surgeon: Aviva Signs, MD;  Location: [facility];  Service: General;  Laterality: Right;  . POSTERIOR LUMBAR FUSION  08/21/2014   laminectomy and decompression L2 -- L5  . RIGHT LOWER LEG SURGERY  X3  1975 to 1976   including ORIF  . TONSILLECTOMY AND ADENOIDECTOMY  1986  . UMBILICAL HERNIA REPAIR  2009 approx     SOCIAL HISTORY:  Social History   Socioeconomic History  . Marital status: Married    Spouse name: Not on file  . Number of children: Not on file  . Years of education: Not on file  . Highest education level: Not on file  Occupational History  . Not on file  Social Needs  . Financial resource strain: Not on file  . Food insecurity    Worry: Not on file    Inability: Not on file  . Transportation needs    Medical: Not on file    Non-medical: Not on file  Tobacco Use  . Smoking status: Former Smoker    Years: 20.00    Types: Cigarettes    Quit date: 07/17/1986    Years since quitting: 32.3  . Smokeless tobacco: Never Used  Substance and Sexual Activity  . Alcohol use: Yes    Alcohol/week: 7.0 - 14.0 standard drinks    Types: 7 - 14 Cans of beer per week    Comment: 1 -2 beer daily  . Drug use: No  . Sexual activity: Not on file  Lifestyle  . Physical activity    Days per week: Not on file    Minutes per session: Not on file  . Stress: Not on file   Relationships  . Social Herbalist on phone: Not on file    Gets together: Not on file    Attends religious service: Not on file    Active member of club or organization: Not on file    Attends meetings of clubs or organizations: Not on file    Relationship status: Not on file  . Intimate partner violence    Fear of current or ex partner: Not on file    Emotionally abused: Not on file    Physically abused: Not on file    Forced sexual activity: Not on file  Other Topics Concern  . Not on file  Social History Narrative  . Not on file    FAMILY HISTORY:  Family History  Problem Relation Age of Onset  . Stroke  Mother   . Prostate cancer Father   . Bone cancer Father   . Rheum arthritis Sister   . Urinary tract infection Sister     CURRENT MEDICATIONS:  Outpatient Encounter Medications as of 11/25/2018  Medication Sig  . acetaminophen (TYLENOL) 500 MG tablet Take 1,000 mg by mouth every 6 (six) hours as needed for mild pain or moderate pain.   Marland Kitchen allopurinol (ZYLOPRIM) 300 MG tablet Take 300 mg by mouth every evening.  Marland Kitchen azaCITIDine 5 mg/2 mLs in lactated ringers infusion Inject into the vein daily. Day 1-7 every 28 days (starting 08/22/2018)  . celecoxib (CELEBREX) 200 MG capsule Take 200 mg by mouth daily. IN THE MORNING  . cetirizine (ZYRTEC) 10 MG tablet Take 10 mg by mouth daily. IN THE MORNING  . diclofenac sodium (VOLTAREN) 1 % GEL Apply 1 g topically 3 (three) times daily as needed (knee pain.).   Marland Kitchen docusate sodium (COLACE) 100 MG capsule Take 100 mg by mouth at bedtime.   . fluticasone (FLONASE) 50 MCG/ACT nasal spray Place 1 spray into both nostrils daily. In the morning  . lansoprazole (PREVACID) 15 MG capsule Take 15 mg by mouth daily before breakfast.   . lidocaine-prilocaine (EMLA) cream Apply pea-sized amount to port a cath site and cover with plastic wrap one hour prior to chemotherapy appointment  . lisinopril (PRINIVIL,ZESTRIL) 5 MG tablet Take 2.5 mg  by mouth daily. In the morning  . magnesium oxide (MAG-OX) 400 MG tablet Take 400 mg by mouth at bedtime.   Vladimir Faster Glycol-Propyl Glycol (SYSTANE OP) Place 1 drop into both eyes 2 (two) times daily.  . prochlorperazine (COMPAZINE) 10 MG tablet Take 1 tablet (10 mg total) by mouth every 6 (six) hours as needed (Nausea or vomiting).  . tamsulosin (FLOMAX) 0.4 MG CAPS capsule Take 0.4 mg by mouth every evening.   . traMADol (ULTRAM) 50 MG tablet Take 1 tablet (50 mg total) by mouth 3 (three) times daily as needed (pain.).   No facility-administered encounter medications on file as of 11/25/2018.     ALLERGIES:  No Known Allergies   PHYSICAL EXAM:  ECOG Performance status: 1  Vitals:   11/25/18 0821  BP: 114/63  Pulse: 84  Resp: 18  Temp: 97.9 F (36.6 C)  SpO2: 96%   Filed Weights   11/25/18 0821  Weight: 235 lb (106.6 kg)    Physical Exam Constitutional:      Appearance: Normal appearance. He is obese.  HENT:     Nose: Nose normal.     Mouth/Throat:     Mouth: Mucous membranes are moist.     Pharynx: Oropharynx is clear.  Eyes:     Extraocular Movements: Extraocular movements intact.     Conjunctiva/sclera: Conjunctivae normal.  Neck:     Musculoskeletal: Normal range of motion.  Cardiovascular:     Rate and Rhythm: Normal rate and regular rhythm.     Pulses: Normal pulses.     Heart sounds: Normal heart sounds.  Pulmonary:     Effort: Pulmonary effort is normal.     Breath sounds: Normal breath sounds.  Abdominal:     General: Bowel sounds are normal.     Palpations: Abdomen is soft.  Musculoskeletal: Normal range of motion.        General: No deformity.  Skin:    General: Skin is warm.  Neurological:     General: No focal deficit present.     Mental Status: He is alert  and oriented to person, place, and time.  Psychiatric:        Mood and Affect: Mood normal.        Behavior: Behavior normal.        Thought Content: Thought content normal.         Judgment: Judgment normal.      LABORATORY DATA:  I have reviewed the labs as listed.  CBC    Component Value Date/Time   WBC 1.0 (LL) 11/25/2018 0759   RBC 2.67 (L) 11/25/2018 0759   HGB 8.3 (L) 11/25/2018 0759   HCT 28.5 (L) 11/25/2018 0759   PLT 51 (L) 11/25/2018 0759   MCV 106.7 (H) 11/25/2018 0759   MCH 31.1 11/25/2018 0759   MCHC 29.1 (L) 11/25/2018 0759   RDW 19.0 (H) 11/25/2018 0759   LYMPHSABS 0.7 11/25/2018 0759   MONOABS 0.1 11/25/2018 0759   EOSABS 0.0 11/25/2018 0759   BASOSABS 0.0 11/25/2018 0759   CMP Latest Ref Rng & Units 11/25/2018 11/11/2018 11/04/2018  Glucose 70 - 99 mg/dL 122(H) 111(H) 136(H)  BUN 8 - 23 mg/dL 29(H) 25(H) 35(H)  Creatinine 0.61 - 1.24 mg/dL 1.52(H) 1.31(H) 1.51(H)  Sodium 135 - 145 mmol/L 141 142 140  Potassium 3.5 - 5.1 mmol/L 4.2 5.4(H) 4.6  Chloride 98 - 111 mmol/L 110 110 104  CO2 22 - 32 mmol/L _0 Calcium 8.9 - 10.3 mg/dL 9.0 9.2 9.1  Total Protein 6.5 - 8.1 g/dL 6.1(L) 6.3(L) 6.1(L)  Total Bilirubin 0.3 - 1.2 mg/dL 1.4(H) 0.9 1.0  Alkaline Phos 38 - 126 U/L 50 53 54  AST 15 - 41 U/L _1 ALT 0 - 44 U/L _2 ASSESSMENT & PLAN:   MDS (myelodysplastic syndrome), high grade ([facility]) 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  Nutritional work-up was negative. 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  Flow cytometry shows 12% blasts. 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  FISH panel is pending. 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 - Vidaza (50 mg/m2) cycle 1 on 08/22/2018. -He received 1 unit of platelets on 09/09/2018. - Cycle 2 of Vidaza (50 mg per metered square) on 09/23/2018. - 1 unit PRBC on 10/01/2018 for hemoglobin of 6.5. - Cycle 3 chemo held for 1 week secondary  to severe neutropenia.  He received cycle 3 on 10/28/2018. -1 unit PRBC on 11/04/2018. - We reviewed his blood work.  His ANC is 200 today.  Total bilirubin is slightly elevated at 1.4. -I have recommended holding his therapy this week and review him next week.  If the white count improves, I will consider switching his chemo to once every 5 weeks starting next cycle.  2.  CKD: -His creatinine is 1.52 today.      Total time spent 25 minutes with more than 50% of the time spent face-to-face discussing treatment plan, prognosis, counseling and coordination of care.  Orders placed this encounter:  No orders of the defined types were placed in this encounter.     [name], MD [place] [phone]

## 2018-11-25 NOTE — Patient Instructions (Signed)
[facility] Discharge Instructions for Patients Receiving Chemotherapy   Beginning January 23rd 2017 lab work for the Cancer Center will be done in the  Main lab at [facility] on 1st floor. If you have a lab appointment with the Cancer Center please come in thru the  Main Entrance and check in at the main information desk   Today you received the following chemotherapy agents Vidaza  To help prevent nausea and vomiting after your treatment, we encourage you to take your nausea medication   If you develop nausea and vomiting, or diarrhea that is not controlled by your medication, call the clinic.  The clinic phone number is [phone]. Office hours are Monday-Friday 8:30am-5:00pm.  BELOW ARE SYMPTOMS THAT SHOULD BE REPORTED IMMEDIATELY:  *FEVER GREATER THAN 101.0 F  *CHILLS WITH OR WITHOUT FEVER  NAUSEA AND VOMITING THAT IS NOT CONTROLLED WITH YOUR NAUSEA MEDICATION  *UNUSUAL SHORTNESS OF BREATH  *UNUSUAL BRUISING OR BLEEDING  TENDERNESS IN MOUTH AND THROAT WITH OR WITHOUT PRESENCE OF ULCERS  *URINARY PROBLEMS  *BOWEL PROBLEMS  UNUSUAL RASH Items with * indicate a potential emergency and should be followed up as soon as possible. If you have an emergency after office hours please contact your primary care physician or go to the nearest emergency department.  Please call the clinic during office hours if you have any questions or concerns.   You may also contact the Patient Navigator at [phone] should you have any questions or need assistance in obtaining follow up care.      Resources For Cancer Patients and their Caregivers ? American Cancer Society: Can assist with transportation, wigs, general needs, runs Look Good Feel Better.        1-888-227-6333 ? Cancer Care: Provides financial assistance, online support groups, medication/co-pay assistance.  1-800-813-HOPE (4673) ? [facility] Assists [place] cancer  patients and their families through emotional , educational and financial support.  336-427-4357 ? [place] DSS Where to apply for food stamps, Medicaid and utility assistance. 336-342-1394 ? [facility]: Transportation to medical appointments. 336-347-2287 ? Social Security Administration: May apply for disability if have a Stage IV cancer. 336-342-7796 1-800-772-1213 ? [place] Aging, Disability and Transit Services: Assists with nutrition, care and transit needs. 336-349-2343          

## 2018-11-25 NOTE — Assessment & Plan Note (Signed)
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  Nutritional work-up was negative. 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  Flow cytometry shows 12% blasts. 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  FISH panel is pending. 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 - Vidaza (50 mg/m2) cycle 1 on 08/22/2018. -He received 1 unit of platelets on 09/09/2018. - Cycle 2 of Vidaza (50 mg per metered square) on 09/23/2018. - 1 unit PRBC on 10/01/2018 for hemoglobin of 6.5. - Cycle 3 chemo held for 1 week secondary to severe neutropenia.  He received cycle 3 on 10/28/2018. -1 unit PRBC on 11/04/2018. - We reviewed his blood work.  His ANC is 200 today.  Total bilirubin is slightly elevated at 1.4. -I have recommended holding his therapy this week and review him next week.  If the white count improves, I will consider switching his chemo to once every 5 weeks starting next cycle.  2.  CKD: -His creatinine is 1.52 today.

## 2018-11-26 ENCOUNTER — Ambulatory Visit (HOSPITAL_COMMUNITY): Payer: Medicare Other

## 2018-11-26 NOTE — Progress Notes (Signed)
CRITICAL VALUE ALERT  Critical Value:  WBC 1.0  Date & Time Notied:  11/25/2018 at [place]  Provider Notified: Dr. Delton Coombes  Orders Received/Actions taken: hold tx today; f/u 1 week

## 2018-11-27 ENCOUNTER — Ambulatory Visit (HOSPITAL_COMMUNITY): Payer: Medicare Other

## 2018-11-28 ENCOUNTER — Ambulatory Visit (HOSPITAL_COMMUNITY): Payer: Medicare Other

## 2018-11-29 ENCOUNTER — Ambulatory Visit (HOSPITAL_COMMUNITY): Payer: Medicare Other

## 2018-12-02 ENCOUNTER — Inpatient Hospital Stay (HOSPITAL_COMMUNITY): Payer: Medicare Other

## 2018-12-02 ENCOUNTER — Other Ambulatory Visit (HOSPITAL_COMMUNITY): Payer: Medicare Other

## 2018-12-02 ENCOUNTER — Inpatient Hospital Stay (HOSPITAL_BASED_OUTPATIENT_CLINIC_OR_DEPARTMENT_OTHER): Payer: Medicare Other | Admitting: Hematology

## 2018-12-02 ENCOUNTER — Ambulatory Visit (HOSPITAL_COMMUNITY): Payer: Medicare Other

## 2018-12-02 ENCOUNTER — Other Ambulatory Visit: Payer: Self-pay

## 2018-12-02 VITALS — BP 121/51 | HR 69 | Resp 16

## 2018-12-02 DIAGNOSIS — D46Z Other myelodysplastic syndromes: Secondary | ICD-10-CM

## 2018-12-02 DIAGNOSIS — Z5111 Encounter for antineoplastic chemotherapy: Secondary | ICD-10-CM | POA: Diagnosis not present

## 2018-12-02 DIAGNOSIS — Z95828 Presence of other vascular implants and grafts: Secondary | ICD-10-CM

## 2018-12-02 LAB — CBC WITH DIFFERENTIAL/PLATELET
Abs Immature Granulocytes: 0.01 10*3/uL (ref 0.00–0.07)
Basophils Absolute: 0 10*3/uL (ref 0.0–0.1)
Basophils Relative: 0 %
Eosinophils Absolute: 0 10*3/uL (ref 0.0–0.5)
Eosinophils Relative: 0 %
HCT: 29 % — ABNORMAL LOW (ref 39.0–52.0)
Hemoglobin: 8.2 g/dL — ABNORMAL LOW (ref 13.0–17.0)
Immature Granulocytes: 1 %
Lymphocytes Relative: 54 %
Lymphs Abs: 0.8 10*3/uL (ref 0.7–4.0)
MCH: 30.3 pg (ref 26.0–34.0)
MCHC: 28.3 g/dL — ABNORMAL LOW (ref 30.0–36.0)
MCV: 107 fL — ABNORMAL HIGH (ref 80.0–100.0)
Monocytes Absolute: 0.2 10*3/uL (ref 0.1–1.0)
Monocytes Relative: 14 %
Neutro Abs: 0.5 10*3/uL — ABNORMAL LOW (ref 1.7–7.7)
Neutrophils Relative %: 31 %
Platelets: 36 10*3/uL — ABNORMAL LOW (ref 150–400)
RBC: 2.71 MIL/uL — ABNORMAL LOW (ref 4.22–5.81)
RDW: 19.6 % — ABNORMAL HIGH (ref 11.5–15.5)
WBC: 1.5 10*3/uL — ABNORMAL LOW (ref 4.0–10.5)
nRBC: 7.4 % — ABNORMAL HIGH (ref 0.0–0.2)

## 2018-12-02 LAB — COMPREHENSIVE METABOLIC PANEL
ALT: 27 U/L (ref 0–44)
AST: 29 U/L (ref 15–41)
Albumin: 3.9 g/dL (ref 3.5–5.0)
Alkaline Phosphatase: 47 U/L (ref 38–126)
Anion gap: 8 (ref 5–15)
BUN: 32 mg/dL — ABNORMAL HIGH (ref 8–23)
CO2: 22 mmol/L (ref 22–32)
Calcium: 9.1 mg/dL (ref 8.9–10.3)
Chloride: 107 mmol/L (ref 98–111)
Creatinine, Ser: 1.32 mg/dL — ABNORMAL HIGH (ref 0.61–1.24)
GFR calc Af Amer: >60 mL/min (ref >60)
GFR calc non Af Amer: 53 mL/min — ABNORMAL LOW (ref >60)
Glucose, Bld: 101 mg/dL — ABNORMAL HIGH (ref 70–99)
Potassium: 4.7 mmol/L (ref 3.5–5.1)
Sodium: 137 mmol/L (ref 135–145)
Total Bilirubin: 1.3 mg/dL — ABNORMAL HIGH (ref 0.3–1.2)
Total Protein: 6.5 g/dL (ref 6.5–8.1)

## 2018-12-02 MED ORDER — SODIUM CHLORIDE 0.9% FLUSH
10.0000 mL | INTRAVENOUS | Status: DC | PRN
  Administered 2018-12-02: 10 mL
  Filled 2018-12-02: qty 10

## 2018-12-02 MED ORDER — SODIUM CHLORIDE 0.9 % IV SOLN
100.0000 mg | Freq: Once | INTRAVENOUS | Status: AC
  Administered 2018-12-02: 100 mg via INTRAVENOUS
  Filled 2018-12-02: qty 10

## 2018-12-02 MED ORDER — HEPARIN SOD (PORK) LOCK FLUSH 100 UNIT/ML IV SOLN
500.0000 [IU] | Freq: Once | INTRAVENOUS | Status: AC | PRN
  Administered 2018-12-02: 500 [IU]

## 2018-12-02 MED ORDER — PALONOSETRON HCL INJECTION 0.25 MG/5ML
0.2500 mg | Freq: Once | INTRAVENOUS | Status: AC
  Administered 2018-12-02: 0.25 mg via INTRAVENOUS
  Filled 2018-12-02: qty 5

## 2018-12-02 MED ORDER — SODIUM CHLORIDE 0.9 % IV SOLN
Freq: Once | INTRAVENOUS | Status: AC
  Administered 2018-12-02: 10:00:00 via INTRAVENOUS

## 2018-12-02 NOTE — Progress Notes (Signed)
Richard Wallace, Richard Wallace [id number]   CLINIC:  Medical Oncology/Hematology  PCP:  Richard Sos, MD [address] [place] [address] [phone]   REASON FOR VISIT:  Follow-up for MDS  CURRENT THERAPY: Vidaza  BRIEF ONCOLOGIC HISTORY:  Oncology History  MDS (myelodysplastic syndrome), high grade ([place])  08/14/2018 Initial Diagnosis   MDS (myelodysplastic syndrome), high grade ([place])   08/22/2018 -  Chemotherapy   The patient had palonosetron (ALOXI) injection 0.25 mg, 0.25 mg, Intravenous,  Once, 4 of 6 cycles Administration: 0.25 mg (08/22/2018), 0.25 mg (08/26/2018), 0.25 mg (08/28/2018), 0.25 mg (08/30/2018), 0.25 mg (10/01/2018), 0.25 mg (10/02/2018), 0.25 mg (11/04/2018), 0.25 mg (09/23/2018), 0.25 mg (09/25/2018), 0.25 mg (09/27/2018), 0.25 mg (10/28/2018), 0.25 mg (10/30/2018), 0.25 mg (11/01/2018), 0.25 mg (12/02/2018) azaCITIDine (VIDAZA) 100 mg in sodium chloride 0.9 % 50 mL chemo infusion, 110 mg (66.7 % of original dose 75 mg/m2), Intravenous, Once, 4 of 6 cycles Dose modification: 50 mg/m2 (original dose 75 mg/m2, Cycle 1, Reason: Provider Judgment), 50 mg/m2 (original dose 75 mg/m2, Cycle 2, Reason: Provider Judgment) Administration: 100 mg (08/22/2018), 100 mg (08/23/2018), 100 mg (08/26/2018), 100 mg (08/27/2018), 100 mg (08/28/2018), 100 mg (08/29/2018), 100 mg (08/30/2018), 100 mg (10/01/2018), 100 mg (10/02/2018), 100 mg (11/04/2018), 100 mg (11/05/2018), 100 mg (09/23/2018), 100 mg (09/24/2018), 100 mg (09/25/2018), 100 mg (09/26/2018), 100 mg (09/27/2018), 100 mg (10/28/2018), 100 mg (10/29/2018), 100 mg (10/30/2018), 100 mg (10/31/2018), 100 mg (11/01/2018), 100 mg (12/02/2018)  for chemotherapy treatment.       CANCER STAGING: Cancer Staging No matching staging information was found for the patient.   INTERVAL HISTORY:  [name] 73 y.o. male seen for follow-up of MDS.  Denies any nosebleeds, rectal bleeding.  Appetite is 75%.  Energy levels are low.   Shortness of breath on exertion is also stable.  He has low back pain and bilateral knee pain for which he takes tramadol 3 times a day.  He also takes Tylenol as needed.  Occasional dizziness when he stands up from sitting position.  Blood pressure is better controlled.  Denies any fevers or chills.  Denies any nausea, vomiting, diarrhea or constipation.  REVIEW OF SYSTEMS:  Review of Systems  Constitutional: Positive for fatigue.  Respiratory: Positive for shortness of breath.   Neurological: Positive for dizziness.  All other systems reviewed and are negative.    PAST MEDICAL/SURGICAL HISTORY:  Past Medical History:  Diagnosis Date  . Arthritis   . 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 Cancer ([place]) 01-28-2014   skin cancer  . CKD (chronic kidney disease), stage III ([place])   . GERD (gastroesophageal reflux disease)   . Heart murmur    NOTED DURING PHYSICAL WHEN HE WAS ENLISTING IN [facility] , DIDNT KNOW UNTIL THAT TIME AND REPORTS , "THATS THE LAST I HEARD ABOUT IT "   . History of hypertension    no longer issue  . History of kidney stones   . History of malignant melanoma of skin    excision top of scalp 2015-- no recurrence  . History of urinary retention    post op lumbar fusion surgery 04/ 2016  . Hypertension   . 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 Left ureteral calculus   . Seasonal allergies   . Wears glasses   . Wears glasses   . Wears partial  dentures    upper and lower   Past Surgical History:  Procedure Laterality Date  . ANKLE FUSION Right 2007  . CARPAL TUNNEL RELEASE Left 12/28/2009   w/ pulley release left long finger  . CARPAL TUNNEL RELEASE Right 07/22/2013   Procedure: RIGHT CARPAL TUNNEL RELEASE;  Surgeon: Cammie Sickle., MD;  Location: [facility];  Service: Orthopedics;  Laterality: Right;  . COLONOSCOPY    . CYSTO/  LEFT RETROGRADE PYELOGRAM   11/21/2010  . CYSTOSCOPY WITH STENT PLACEMENT Left 03/09/2016   Procedure: CYSTOSCOPY WITH STENT PLACEMENT;  Surgeon: Franchot Gallo, MD;  Location: [facility];  Service: Urology;  Laterality: Left;  . CYSTOSCOPY/RETROGRADE/URETEROSCOPY/STONE EXTRACTION WITH BASKET Left 03/09/2016   Procedure: CYSTOSCOPY/RETROGRADE/URETEROSCOPY/STONE EXTRACTION WITH BASKET;  Surgeon: Franchot Gallo, MD;  Location: [facility];  Service: Urology;  Laterality: Left;  . LEFT URETEROSCOPIC LASER LITHOTRIPSY STONE EXTRACTION/ STENT PLACEMENT  05/23/2010  . MOHS SURGERY     TOP OF THE HEAD   . ORIF ANKLE FRACTURE Right 1978  . PORTACATH PLACEMENT Right 08/19/2018   Procedure: INSERTION PORT-A-CATH (attached catheter in right subclavian);  Surgeon: Aviva Signs, MD;  Location: [facility];  Service: General;  Laterality: Right;  . POSTERIOR LUMBAR FUSION  08/21/2014   laminectomy and decompression L2 -- L5  . RIGHT LOWER LEG SURGERY  X3  1975 to 1976   including ORIF  . TONSILLECTOMY AND ADENOIDECTOMY  1986  . UMBILICAL HERNIA REPAIR  2009 approx     SOCIAL HISTORY:  Social History   Socioeconomic History  . Marital status: Married    Spouse name: Not on file  . Number of children: Not on file  . Years of education: Not on file  . Highest education level: Not on file  Occupational History  . Not on file  Social Needs  . Financial resource strain: Not on file  . Food insecurity    Worry: Not on file    Inability: Not on file  . Transportation needs    Medical: Not on file    Non-medical: Not on file  Tobacco Use  . Smoking status: Former Smoker    Years: 20.00    Types: Cigarettes    Quit date: 07/17/1986    Years since quitting: 32.4  . Smokeless tobacco: Never Used  Substance and Sexual Activity  . Alcohol use: Yes    Alcohol/week: 7.0 - 14.0 standard drinks    Types: 7 - 14 Cans of beer per week    Comment: 1 -2 beer daily  . Drug use: No  . Sexual activity:  Not on file  Lifestyle  . Physical activity    Days per week: Not on file    Minutes per session: Not on file  . Stress: Not on file  Relationships  . Social Herbalist on phone: Not on file    Gets together: Not on file    Attends religious service: Not on file    Active member of club or organization: Not on file    Attends meetings of clubs or organizations: Not on file    Relationship status: Not on file  . Intimate partner violence    Fear of current or ex partner: Not on file    Emotionally abused: Not on file    Physically abused: Not on file    Forced sexual activity: Not on file  Other Topics Concern  . Not on file  Social  History Narrative  . Not on file    FAMILY HISTORY:  Family History  Problem Relation Age of Onset  . Stroke Mother   . Prostate cancer Father   . Bone cancer Father   . Rheum arthritis Sister   . Urinary tract infection Sister     CURRENT MEDICATIONS:  Outpatient Encounter Medications as of 12/02/2018  Medication Sig  . acetaminophen (TYLENOL) 500 MG tablet Take 1,000 mg by mouth every 6 (six) hours as needed for mild pain or moderate pain.   Marland Kitchen allopurinol (ZYLOPRIM) 300 MG tablet Take 300 mg by mouth every evening.  Marland Kitchen azaCITIDine 5 mg/2 mLs in lactated ringers infusion Inject into the vein daily. Day 1-7 every 28 days (starting 08/22/2018)  . celecoxib (CELEBREX) 200 MG capsule Take 200 mg by mouth daily. IN THE MORNING  . cetirizine (ZYRTEC) 10 MG tablet Take 10 mg by mouth daily. IN THE MORNING  . diclofenac sodium (VOLTAREN) 1 % GEL Apply 1 g topically 3 (three) times daily as needed (knee pain.).   Marland Kitchen docusate sodium (COLACE) 100 MG capsule Take 100 mg by mouth at bedtime.   . fluticasone (FLONASE) 50 MCG/ACT nasal spray Place 1 spray into both nostrils daily. In the morning  . lansoprazole (PREVACID) 15 MG capsule Take 15 mg by mouth daily before breakfast.   . lidocaine-prilocaine (EMLA) cream Apply pea-sized amount to port a  cath site and cover with plastic wrap one hour prior to chemotherapy appointment  . lisinopril (PRINIVIL,ZESTRIL) 5 MG tablet Take 2.5 mg by mouth daily. In the morning  . magnesium oxide (MAG-OX) 400 MG tablet Take 400 mg by mouth at bedtime.   Vladimir Faster Glycol-Propyl Glycol (SYSTANE OP) Place 1 drop into both eyes 2 (two) times daily.  . prochlorperazine (COMPAZINE) 10 MG tablet Take 1 tablet (10 mg total) by mouth every 6 (six) hours as needed (Nausea or vomiting).  . tamsulosin (FLOMAX) 0.4 MG CAPS capsule Take 0.4 mg by mouth every evening.   . traMADol (ULTRAM) 50 MG tablet Take 1 tablet (50 mg total) by mouth 3 (three) times daily as needed (pain.).   No facility-administered encounter medications on file as of 12/02/2018.     ALLERGIES:  No Known Allergies   PHYSICAL EXAM:  ECOG Performance status: 1  Vitals:   12/02/18 0822  BP: 128/70  Pulse: 85  Resp: 18  Temp: (!) 97.3 F (36.3 C)  SpO2: 97%   Filed Weights   12/02/18 0822  Weight: 229 lb 6.4 oz (104.1 kg)    Physical Exam Constitutional:      Appearance: Normal appearance. He is obese.  HENT:     Nose: Nose normal.     Mouth/Throat:     Mouth: Mucous membranes are moist.     Pharynx: Oropharynx is clear.  Eyes:     Extraocular Movements: Extraocular movements intact.     Conjunctiva/sclera: Conjunctivae normal.  Neck:     Musculoskeletal: Normal range of motion.  Cardiovascular:     Rate and Rhythm: Normal rate and regular rhythm.     Pulses: Normal pulses.     Heart sounds: Normal heart sounds.  Pulmonary:     Effort: Pulmonary effort is normal.     Breath sounds: Normal breath sounds.  Abdominal:     General: Bowel sounds are normal.     Palpations: Abdomen is soft.  Musculoskeletal: Normal range of motion.        General: No deformity.  Skin:  General: Skin is warm.  Neurological:     General: No focal deficit present.     Mental Status: He is alert and oriented to person, place, and  time.  Psychiatric:        Mood and Affect: Mood normal.        Behavior: Behavior normal.        Thought Content: Thought content normal.        Judgment: Judgment normal.      LABORATORY DATA:  I have reviewed the labs as listed.  CBC    Component Value Date/Time   WBC 1.5 (L) 12/02/2018 0820   RBC 2.71 (L) 12/02/2018 0820   HGB 8.2 (L) 12/02/2018 0820   HCT 29.0 (L) 12/02/2018 0820   PLT 36 (L) 12/02/2018 0820   MCV 107.0 (H) 12/02/2018 0820   MCH 30.3 12/02/2018 0820   MCHC 28.3 (L) 12/02/2018 0820   RDW 19.6 (H) 12/02/2018 0820   LYMPHSABS 0.8 12/02/2018 0820   MONOABS 0.2 12/02/2018 0820   EOSABS 0.0 12/02/2018 0820   BASOSABS 0.0 12/02/2018 0820   CMP Latest Ref Rng & Units 12/02/2018 11/25/2018 11/11/2018  Glucose 70 - 99 mg/dL 101(H) 122(H) 111(H)  BUN 8 - 23 mg/dL 32(H) 29(H) 25(H)  Creatinine 0.61 - 1.24 mg/dL 1.32(H) 1.52(H) 1.31(H)  Sodium 135 - 145 mmol/L 137 141 142  Potassium 3.5 - 5.1 mmol/L 4.7 4.2 5.4(H)  Chloride 98 - 111 mmol/L 107 110 110  CO2 22 - 32 mmol/L '22 23 24  '$ Calcium 8.9 - 10.3 mg/dL 9.1 9.0 9.2  Total Protein 6.5 - 8.1 g/dL 6.5 6.1(L) 6.3(L)  Total Bilirubin 0.3 - 1.2 mg/dL 1.3(H) 1.4(H) 0.9  Alkaline Phos 38 - 126 U/L 47 50 53  AST 15 - 41 U/L '29 27 23  '$ ALT 0 - 44 U/L '27 24 22        '$ ASSESSMENT & PLAN:   MDS (myelodysplastic syndrome), high grade (HCC) 1.  High-grade MDS: - Seen for evaluation of pancytopenia prior to elective knee replacement.  Bone marrow biopsy on 07/30/2018 shows hypercellular bone marrow with MDS with excess blasts (MDS-EB 2).  Flow cytometry shows 12% blasts. 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  IPSS-R score of 5.5 with high risk MDS, median survival of 1.6 years without treatment, 25% AML progression in the absence of treatment of 1.4 years.  FISH panel for MDS was normal. -Vidaza (50 mg per metered square) 7 days cycle 1 on 08/22/2018, cycle 2 on 09/23/2018, cycle 3 on 10/28/2018.  He required 1 unit of PRBC  after each cycle. - Last week chemotherapy was held because of [place] being 200. -We have reviewed his blood work today.  White count improved to 1.5 with ANC of 500.  Hemoglobin is above 8.  Platelet count however dropped to 36. - I have recommended starting his next cycle, cycle 4 today.  I will cut back on Vidaza to 5 days at 50 mg per metered square. -We will continue to monitor his blood counts on a weekly basis.  2.  CKD: - Creatinine today improved to 1.32.  3.  Back pain and knee pains: -He is taking tramadol 3 times a day along with Tylenol.   Total time spent 25 minutes with more than 50% of the time spent face-to-face discussing treatment plan, prognosis, counseling and coordination of care.  Orders placed this encounter:  No orders of the defined types were placed in this encounter.     [name],  MD [place] [phone]

## 2018-12-02 NOTE — Assessment & Plan Note (Addendum)
1.  High-grade MDS: - Seen for evaluation of pancytopenia prior to elective knee replacement.  Bone marrow biopsy on 07/30/2018 shows hypercellular bone marrow with MDS with excess blasts (MDS-EB 2).  Flow cytometry shows 12% blasts. 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  IPSS-R score of 5.5 with high risk MDS, median survival of 1.6 years without treatment, 25% AML progression in the absence of treatment of 1.4 years.  FISH panel for MDS was normal. -Vidaza (50 mg per metered square) 7 days cycle 1 on 08/22/2018, cycle 2 on 09/23/2018, cycle 3 on 10/28/2018.  He required 1 unit of PRBC after each cycle. - Last week chemotherapy was held because of [place] being 200. -We have reviewed his blood work today.  White count improved to 1.5 with ANC of 500.  Hemoglobin is above 8.  Platelet count however dropped to 36. - I have recommended starting his next cycle, cycle 4 today.  I will cut back on Vidaza to 5 days at 50 mg per metered square. -We will continue to monitor his blood counts on a weekly basis.  2.  CKD: - Creatinine today improved to 1.32.  3.  Back pain and knee pains: -He is taking tramadol 3 times a day along with Tylenol.

## 2018-12-02 NOTE — Patient Instructions (Signed)
[facility] at [facility] Discharge Instructions  You were seen today by Dr. Katragadda. He went over your recent lab results. He will see you back in 4 weeks for labs and follow up.   Thank you for choosing [facility] at [facility] to provide your oncology and hematology care.  To afford each patient quality time with our provider, please arrive at least 15 minutes before your scheduled appointment time.   If you have a lab appointment with the Cancer Center please come in thru the  Main Entrance and check in at the main information desk  You need to re-schedule your appointment should you arrive 10 or more minutes late.  We strive to give you quality time with our providers, and arriving late affects you and other patients whose appointments are after yours.  Also, if you no show three or more times for appointments you may be dismissed from the clinic at the providers discretion.     Again, thank you for choosing [facility].  Our hope is that these requests will decrease the amount of time that you wait before being seen by our physicians.       _____________________________________________________________  Should you have questions after your visit to  Cancer Center, please contact our office at [phone] between the hours of 8:00 a.m. and 4:30 p.m.  Voicemails left after 4:00 p.m. will not be returned until the following business day.  For prescription refill requests, have your pharmacy contact our office and allow 72 hours.    Cancer Center Support Programs:   > Cancer Support Group  2nd Tuesday of the month 1pm-2pm, Journey Room    

## 2018-12-02 NOTE — Progress Notes (Signed)
Platelets 36 today and ANC 0.5 today.  Patient denied blood in stools, urine, nose, or unusual bruising.  Reviewed labs with Dr. Delton Coombes with verbal order ok to treat today.  Per Dr. Delton Coombes the patient will be treated for only five days. Pharmacy notified.   Reviewed with the patient low platelet precautions and neutropenia precautions with verbal understanding verbalized.    Patient tolerated chemotherapy with no complaints voiced.  Port site clean and dry with no bruising or swelling noted at site.  Good blood return noted before and after administration of chemotherapy.  Dressing intact.   Patient left ambulatory with VSS and no s/s of distress noted.

## 2018-12-03 ENCOUNTER — Inpatient Hospital Stay (HOSPITAL_COMMUNITY): Payer: Medicare Other

## 2018-12-03 ENCOUNTER — Ambulatory Visit (HOSPITAL_COMMUNITY): Payer: Medicare Other

## 2018-12-03 VITALS — BP 108/57 | HR 72 | Temp 98.6°F | Resp 16

## 2018-12-03 DIAGNOSIS — Z5111 Encounter for antineoplastic chemotherapy: Secondary | ICD-10-CM | POA: Diagnosis not present

## 2018-12-03 DIAGNOSIS — D46Z Other myelodysplastic syndromes: Secondary | ICD-10-CM

## 2018-12-03 DIAGNOSIS — Z95828 Presence of other vascular implants and grafts: Secondary | ICD-10-CM

## 2018-12-03 MED ORDER — SODIUM CHLORIDE 0.9% FLUSH
10.0000 mL | INTRAVENOUS | Status: DC | PRN
  Administered 2018-12-03: 10 mL
  Filled 2018-12-03: qty 10

## 2018-12-03 MED ORDER — SODIUM CHLORIDE 0.9 % IV SOLN
Freq: Once | INTRAVENOUS | Status: AC
  Administered 2018-12-03: 09:00:00 via INTRAVENOUS

## 2018-12-03 MED ORDER — HEPARIN SOD (PORK) LOCK FLUSH 100 UNIT/ML IV SOLN
500.0000 [IU] | Freq: Once | INTRAVENOUS | Status: AC | PRN
  Administered 2018-12-03: 500 [IU]

## 2018-12-03 MED ORDER — SODIUM CHLORIDE 0.9 % IV SOLN
45.0000 mg/m2 | Freq: Once | INTRAVENOUS | Status: AC
  Administered 2018-12-03: 100 mg via INTRAVENOUS
  Filled 2018-12-03: qty 10

## 2018-12-03 NOTE — Patient Instructions (Signed)
[facility] Discharge Instructions for Patients Receiving Chemotherapy   Beginning January 23rd 2017 lab work for the [facility] will be done in the  Main lab at [facility] on 1st floor. If you have a lab appointment with the [facility] please come in thru the  Main Entrance and check in at the main information desk   Today you received the following chemotherapy agents Vidaza  To help prevent nausea and vomiting after your treatment, we encourage you to take your nausea medication   If you develop nausea and vomiting, or diarrhea that is not controlled by your medication, call the clinic.  The clinic phone number is [phone]. Office hours are Monday-Friday 8:30am-5:00pm.  BELOW ARE SYMPTOMS THAT SHOULD BE REPORTED IMMEDIATELY:  *FEVER GREATER THAN 101.0 F  *CHILLS WITH OR WITHOUT FEVER  NAUSEA AND VOMITING THAT IS NOT CONTROLLED WITH YOUR NAUSEA MEDICATION  *UNUSUAL SHORTNESS OF BREATH  *UNUSUAL BRUISING OR BLEEDING  TENDERNESS IN MOUTH AND THROAT WITH OR WITHOUT PRESENCE OF ULCERS  *URINARY PROBLEMS  *BOWEL PROBLEMS  UNUSUAL RASH Items with * indicate a potential emergency and should be followed up as soon as possible. If you have an emergency after office hours please contact your primary care physician or go to the nearest emergency department.  Please call the clinic during office hours if you have any questions or concerns.   You may also contact the Patient Navigator at [phone] should you have any questions or need assistance in obtaining follow up care.      Resources For Cancer Patients and their Caregivers ? American Cancer Society: Can assist with transportation, wigs, general needs, runs Look Good Feel Better.        423-587-5723 ? Cancer Care: Provides financial assistance, online support groups, medication/co-pay assistance.  1-800-813-HOPE (503)491-0029) ? [place] Assists [place] cancer  patients and their families through emotional , educational and financial support.  917 297 2806 ? [place] DSS Where to apply for food stamps, Medicaid and utility assistance. 760-693-4190 ? [employer]: Transportation to medical appointments. 636-520-1884 ? Social Security Administration: May apply for disability if have a Stage IV cancer. 7071207652 620-314-3547 ? [employer], Disability and Transit Services: Assists with nutrition, care and transit needs. (319)383-0121      Azacitidine suspension for injection  What is this medicine? AZACITIDINE (ay Quinter) is a chemotherapy drug. This medicine reduces the growth of cancer cells and can suppress the immune system. It is used for treating myelodysplastic syndrome or some types of leukemia. This medicine may be used for other purposes; ask your health care provider or pharmacist if you have questions. COMMON BRAND NAME(S): Vidaza What should I tell my health care provider before I take this medicine? They need to know if you have any of these conditions:  kidney disease  liver disease  liver tumors  an unusual or allergic reaction to azacitidine, mannitol, other medicines, foods, dyes, or preservatives  pregnant or trying to get pregnant  breast-feeding How should I use this medicine? This medicine is for injection under the skin. It is administered in a hospital or clinic by a specially trained health care professional. Talk to your pediatrician regarding the use of this medicine in children. While this drug may be prescribed for selected conditions, precautions do apply. Overdosage: If you think you have taken too much of this medicine contact a poison control center or emergency room at once. NOTE: This medicine  is only for you. Do not share this medicine with others. What if I miss a dose? It is important not to miss your dose. Call your doctor or health care professional if you are unable to keep an  appointment. What may interact with this medicine? Interactions have not been studied. Give your health care provider a list of all the medicines, herbs, non-prescription drugs, or dietary supplements you use. Also tell them if you smoke, drink alcohol, or use illegal drugs. Some items may interact with your medicine. This list may not describe all possible interactions. Give your health care provider a list of all the medicines, herbs, non-prescription drugs, or dietary supplements you use. Also tell them if you smoke, drink alcohol, or use illegal drugs. Some items may interact with your medicine. What should I watch for while using this medicine? Visit your doctor for checks on your progress. This drug may make you feel generally unwell. This is not uncommon, as chemotherapy can affect healthy cells as well as cancer cells. Report any side effects. Continue your course of treatment even though you feel ill unless your doctor tells you to stop. In some cases, you may be given additional medicines to help with side effects. Follow all directions for their use. Call your doctor or health care professional for advice if you get a fever, chills or sore throat, or other symptoms of a cold or flu. Do not treat yourself. This drug decreases your body's ability to fight infections. Try to avoid being around people who are sick. This medicine may increase your risk to bruise or bleed. Call your doctor or health care professional if you notice any unusual bleeding. You may need blood work done while you are taking this medicine. Do not become pregnant while taking this medicine and for 6 months after the last dose. Women should inform their doctor if they wish to become pregnant or think they might be pregnant. Men should not father a child while taking this medicine and for 3 months after the last dose. There is a potential for serious side effects to an unborn child. Talk to your health care professional or  pharmacist for more information. Do not breast-feed an infant while taking this medicine and for 1 week after the last dose. This medicine may interfere with the ability to have a child. Talk with your doctor or health care professional if you are concerned about your fertility. What side effects may I notice from receiving this medicine? Side effects that you should report to your doctor or health care professional as soon as possible:  allergic reactions like skin rash, itching or hives, swelling of the face, lips, or tongue  low blood counts - this medicine may decrease the number of white blood cells, red blood cells and platelets. You may be at increased risk for infections and bleeding.  signs of infection - fever or chills, cough, sore throat, pain passing urine  signs of decreased platelets or bleeding - bruising, pinpoint red spots on the skin, black, tarry stools, blood in the urine  signs of decreased red blood cells - unusually weak or tired, fainting spells, lightheadedness  signs and symptoms of kidney injury like trouble passing urine or change in the amount of urine  signs and symptoms of liver injury like dark yellow or brown urine; general ill feeling or flu-like symptoms; light-colored stools; loss of appetite; nausea; right upper belly pain; unusually weak or tired; yellowing of the eyes or skin Side  effects that usually do not require medical attention (report to your doctor or health care professional if they continue or are bothersome):  constipation  diarrhea  nausea, vomiting  pain or redness at the injection site  unusually weak or tired This list may not describe all possible side effects. Call your doctor for medical advice about side effects. You may report side effects to FDA at 1-800-FDA-1088. Where should I keep my medicine? This drug is given in a hospital or clinic and will not be stored at home. NOTE: This sheet is a summary. It may not cover all  possible information. If you have questions about this medicine, talk to your doctor, pharmacist, or health care provider.  2020 Elsevier/Gold Standard (2016-05-23 14:37:51)

## 2018-12-03 NOTE — Progress Notes (Signed)
Richard Wallace presents today for day 2 Vidaza. Tolerated infusion without incident or complaint. VSS. Port flushed and left accessed for use tomorrow. Discharged self ambulatory in satisfactory condition.

## 2018-12-04 ENCOUNTER — Inpatient Hospital Stay (HOSPITAL_COMMUNITY): Payer: Medicare Other

## 2018-12-04 ENCOUNTER — Other Ambulatory Visit: Payer: Self-pay

## 2018-12-04 VITALS — BP 124/64 | HR 75 | Temp 97.4°F | Resp 18

## 2018-12-04 DIAGNOSIS — D46Z Other myelodysplastic syndromes: Secondary | ICD-10-CM

## 2018-12-04 DIAGNOSIS — Z5111 Encounter for antineoplastic chemotherapy: Secondary | ICD-10-CM | POA: Diagnosis not present

## 2018-12-04 DIAGNOSIS — Z95828 Presence of other vascular implants and grafts: Secondary | ICD-10-CM

## 2018-12-04 MED ORDER — SODIUM CHLORIDE 0.9% FLUSH
10.0000 mL | INTRAVENOUS | Status: DC | PRN
  Administered 2018-12-04 (×2): 10 mL
  Filled 2018-12-04 (×2): qty 10

## 2018-12-04 MED ORDER — HEPARIN SOD (PORK) LOCK FLUSH 100 UNIT/ML IV SOLN
500.0000 [IU] | Freq: Once | INTRAVENOUS | Status: AC | PRN
  Administered 2018-12-04: 500 [IU]

## 2018-12-04 MED ORDER — PALONOSETRON HCL INJECTION 0.25 MG/5ML
0.2500 mg | Freq: Once | INTRAVENOUS | Status: AC
  Administered 2018-12-04: 0.25 mg via INTRAVENOUS
  Filled 2018-12-04: qty 5

## 2018-12-04 MED ORDER — SODIUM CHLORIDE 0.9 % IV SOLN
45.0000 mg/m2 | Freq: Once | INTRAVENOUS | Status: AC
  Administered 2018-12-04: 10:00:00 100 mg via INTRAVENOUS
  Filled 2018-12-04: qty 10

## 2018-12-04 MED ORDER — SODIUM CHLORIDE 0.9 % IV SOLN
Freq: Once | INTRAVENOUS | Status: AC
  Administered 2018-12-04: 09:00:00 via INTRAVENOUS

## 2018-12-04 NOTE — Progress Notes (Signed)
Pt presents today for Day 3 Vidaza. No complaints of any changes since the last visit. VS within parameters for treatment.   Treatment given today per MD orders. Tolerated infusion without adverse affects. Vital signs stable. No complaints at this time. Discharged from clinic ambulatory. F/U with [facility] as scheduled.

## 2018-12-04 NOTE — Patient Instructions (Signed)
[facility] Discharge Instructions for Patients Receiving Chemotherapy  Today you received the following chemotherapy agents   To help prevent nausea and vomiting after your treatment, we encourage you to take your nausea medication   If you develop nausea and vomiting that is not controlled by your nausea medication, call the clinic.   BELOW ARE SYMPTOMS THAT SHOULD BE REPORTED IMMEDIATELY:  *FEVER GREATER THAN 100.5 F  *CHILLS WITH OR WITHOUT FEVER  NAUSEA AND VOMITING THAT IS NOT CONTROLLED WITH YOUR NAUSEA MEDICATION  *UNUSUAL SHORTNESS OF BREATH  *UNUSUAL BRUISING OR BLEEDING  TENDERNESS IN MOUTH AND THROAT WITH OR WITHOUT PRESENCE OF ULCERS  *URINARY PROBLEMS  *BOWEL PROBLEMS  UNUSUAL RASH Items with * indicate a potential emergency and should be followed up as soon as possible.  Feel free to call the clinic should you have any questions or concerns. The clinic phone number is [phone].  Please show the CHEMO ALERT CARD at check-in to the Emergency Department and triage nurse.   

## 2018-12-05 ENCOUNTER — Ambulatory Visit (HOSPITAL_COMMUNITY): Payer: Medicare Other

## 2018-12-05 ENCOUNTER — Inpatient Hospital Stay (HOSPITAL_COMMUNITY): Payer: Medicare Other

## 2018-12-05 VITALS — BP 115/60 | HR 90 | Temp 98.5°F | Resp 18

## 2018-12-05 DIAGNOSIS — Z95828 Presence of other vascular implants and grafts: Secondary | ICD-10-CM

## 2018-12-05 DIAGNOSIS — D46Z Other myelodysplastic syndromes: Secondary | ICD-10-CM

## 2018-12-05 DIAGNOSIS — Z5111 Encounter for antineoplastic chemotherapy: Secondary | ICD-10-CM | POA: Diagnosis not present

## 2018-12-05 MED ORDER — HEPARIN SOD (PORK) LOCK FLUSH 100 UNIT/ML IV SOLN
500.0000 [IU] | Freq: Once | INTRAVENOUS | Status: AC | PRN
  Administered 2018-12-05: 500 [IU]

## 2018-12-05 MED ORDER — SODIUM CHLORIDE 0.9 % IV SOLN
Freq: Once | INTRAVENOUS | Status: AC
  Administered 2018-12-05: 13:00:00 via INTRAVENOUS

## 2018-12-05 MED ORDER — SODIUM CHLORIDE 0.9% FLUSH
10.0000 mL | INTRAVENOUS | Status: DC | PRN
  Administered 2018-12-05: 10 mL
  Filled 2018-12-05: qty 10

## 2018-12-05 MED ORDER — SODIUM CHLORIDE 0.9 % IV SOLN
45.0000 mg/m2 | Freq: Once | INTRAVENOUS | Status: AC
  Administered 2018-12-05: 100 mg via INTRAVENOUS
  Filled 2018-12-05: qty 10

## 2018-12-05 NOTE — Progress Notes (Signed)
Pt presents today for Vidaza Day 4. VS within parameters for treatment. Pt has no complaints of pain today. Pt has complaints of fatigue which is the same as it has been this week. MAR reviewed.   Treatment given today per MD orders. Tolerated infusion without adverse affects. Vital signs stable. No complaints at this time. Discharged from clinic ambulatory. F/U with [facility] as scheduled.

## 2018-12-05 NOTE — Patient Instructions (Signed)
[facility] Discharge Instructions for Patients Receiving Chemotherapy  Today you received the following chemotherapy agents   To help prevent nausea and vomiting after your treatment, we encourage you to take your nausea medications.    If you develop nausea and vomiting that is not controlled by your nausea medication, call the clinic.   BELOW ARE SYMPTOMS THAT SHOULD BE REPORTED IMMEDIATELY:  *FEVER GREATER THAN 100.5 F  *CHILLS WITH OR WITHOUT FEVER  NAUSEA AND VOMITING THAT IS NOT CONTROLLED WITH YOUR NAUSEA MEDICATION  *UNUSUAL SHORTNESS OF BREATH  *UNUSUAL BRUISING OR BLEEDING  TENDERNESS IN MOUTH AND THROAT WITH OR WITHOUT PRESENCE OF ULCERS  *URINARY PROBLEMS  *BOWEL PROBLEMS  UNUSUAL RASH Items with * indicate a potential emergency and should be followed up as soon as possible.  Feel free to call the clinic should you have any questions or concerns. The clinic phone number is [phone].  Please show the CHEMO ALERT CARD at check-in to the Emergency Department and triage nurse.   

## 2018-12-06 ENCOUNTER — Other Ambulatory Visit: Payer: Self-pay

## 2018-12-06 ENCOUNTER — Inpatient Hospital Stay (HOSPITAL_COMMUNITY): Payer: Medicare Other

## 2018-12-06 VITALS — BP 113/62 | HR 73 | Temp 98.2°F | Resp 18 | Wt 229.0 lb

## 2018-12-06 DIAGNOSIS — Z5111 Encounter for antineoplastic chemotherapy: Secondary | ICD-10-CM | POA: Diagnosis not present

## 2018-12-06 DIAGNOSIS — D46Z Other myelodysplastic syndromes: Secondary | ICD-10-CM

## 2018-12-06 DIAGNOSIS — Z95828 Presence of other vascular implants and grafts: Secondary | ICD-10-CM

## 2018-12-06 MED ORDER — PALONOSETRON HCL INJECTION 0.25 MG/5ML
INTRAVENOUS | Status: AC
  Filled 2018-12-06: qty 5

## 2018-12-06 MED ORDER — SODIUM CHLORIDE 0.9% FLUSH
10.0000 mL | INTRAVENOUS | Status: DC | PRN
  Administered 2018-12-06: 10 mL
  Filled 2018-12-06: qty 10

## 2018-12-06 MED ORDER — HEPARIN SOD (PORK) LOCK FLUSH 100 UNIT/ML IV SOLN
500.0000 [IU] | Freq: Once | INTRAVENOUS | Status: AC | PRN
  Administered 2018-12-06: 500 [IU]

## 2018-12-06 MED ORDER — SODIUM CHLORIDE 0.9 % IV SOLN
45.0000 mg/m2 | Freq: Once | INTRAVENOUS | Status: AC
  Administered 2018-12-06: 100 mg via INTRAVENOUS
  Filled 2018-12-06: qty 10

## 2018-12-06 MED ORDER — PALONOSETRON HCL INJECTION 0.25 MG/5ML
0.2500 mg | Freq: Once | INTRAVENOUS | Status: AC
  Administered 2018-12-06: 0.25 mg via INTRAVENOUS

## 2018-12-06 MED ORDER — SODIUM CHLORIDE 0.9 % IV SOLN
Freq: Once | INTRAVENOUS | Status: AC
  Administered 2018-12-06: 09:00:00 via INTRAVENOUS

## 2018-12-06 NOTE — Patient Instructions (Signed)
[facility] Discharge Instructions for Patients Receiving Chemotherapy  Today you received the following chemotherapy agents   To help prevent nausea and vomiting after your treatment, we encourage you to take your nausea medication   If you develop nausea and vomiting that is not controlled by your nausea medication, call the clinic.   BELOW ARE SYMPTOMS THAT SHOULD BE REPORTED IMMEDIATELY:  *FEVER GREATER THAN 100.5 F  *CHILLS WITH OR WITHOUT FEVER  NAUSEA AND VOMITING THAT IS NOT CONTROLLED WITH YOUR NAUSEA MEDICATION  *UNUSUAL SHORTNESS OF BREATH  *UNUSUAL BRUISING OR BLEEDING  TENDERNESS IN MOUTH AND THROAT WITH OR WITHOUT PRESENCE OF ULCERS  *URINARY PROBLEMS  *BOWEL PROBLEMS  UNUSUAL RASH Items with * indicate a potential emergency and should be followed up as soon as possible.  Feel free to call the clinic should you have any questions or concerns. The clinic phone number is [phone].  Please show the CHEMO ALERT CARD at check-in to the Emergency Department and triage nurse.   

## 2018-12-06 NOTE — Progress Notes (Signed)
Pt presents today for treatment. VS within parameters for treatment. Pt has no complaints of any changes since the last visit.   Pt requests that a pressure dressing be placed after needle removed. Reviewed labs from the 27 of August. Pt teaching performed. Reported pt's complaints of port bleeding per patients words after needle removal on occasion. VO received to place a pressure dressing on patient prior to discharge.    Treatment given today per MD orders. Tolerated infusion without adverse affects. Vital signs stable. No complaints at this time. Pt teaching performed pertaining to abnormal bleeding. Understanding verbalized. Discharged from clinic ambulatory. F/U with [facility] as scheduled.

## 2018-12-09 ENCOUNTER — Ambulatory Visit (HOSPITAL_COMMUNITY): Payer: Medicare Other

## 2018-12-09 ENCOUNTER — Other Ambulatory Visit (HOSPITAL_COMMUNITY): Payer: Medicare Other

## 2018-12-10 ENCOUNTER — Ambulatory Visit (HOSPITAL_COMMUNITY): Payer: Medicare Other

## 2018-12-19 ENCOUNTER — Encounter (HOSPITAL_COMMUNITY): Payer: Self-pay

## 2018-12-19 ENCOUNTER — Inpatient Hospital Stay (HOSPITAL_COMMUNITY): Payer: Medicare Other

## 2018-12-19 ENCOUNTER — Inpatient Hospital Stay (HOSPITAL_COMMUNITY): Payer: Medicare Other | Attending: Hematology

## 2018-12-19 ENCOUNTER — Other Ambulatory Visit (HOSPITAL_COMMUNITY): Payer: Self-pay | Admitting: Emergency Medicine

## 2018-12-19 ENCOUNTER — Other Ambulatory Visit: Payer: Self-pay

## 2018-12-19 VITALS — BP 141/75 | HR 68 | Temp 98.4°F | Resp 16

## 2018-12-19 DIAGNOSIS — D46Z Other myelodysplastic syndromes: Secondary | ICD-10-CM | POA: Diagnosis present

## 2018-12-19 DIAGNOSIS — R58 Hemorrhage, not elsewhere classified: Secondary | ICD-10-CM

## 2018-12-19 LAB — CBC WITH DIFFERENTIAL/PLATELET
Abs Immature Granulocytes: 0 10*3/uL (ref 0.00–0.07)
Basophils Absolute: 0 10*3/uL (ref 0.0–0.1)
Basophils Relative: 0 %
Eosinophils Absolute: 0 10*3/uL (ref 0.0–0.5)
Eosinophils Relative: 0 %
HCT: 31.7 % — ABNORMAL LOW (ref 39.0–52.0)
Hemoglobin: 8.5 g/dL — ABNORMAL LOW (ref 13.0–17.0)
Immature Granulocytes: 0 %
Lymphocytes Relative: 76 %
Lymphs Abs: 1.1 10*3/uL (ref 0.7–4.0)
MCH: 29.5 pg (ref 26.0–34.0)
MCHC: 26.8 g/dL — ABNORMAL LOW (ref 30.0–36.0)
MCV: 110.1 fL — ABNORMAL HIGH (ref 80.0–100.0)
Monocytes Absolute: 0.1 10*3/uL (ref 0.1–1.0)
Monocytes Relative: 5 %
Neutro Abs: 0.3 10*3/uL — ABNORMAL LOW (ref 1.7–7.7)
Neutrophils Relative %: 19 %
Platelets: 13 10*3/uL — CL (ref 150–400)
RBC: 2.88 MIL/uL — ABNORMAL LOW (ref 4.22–5.81)
RDW: 20.3 % — ABNORMAL HIGH (ref 11.5–15.5)
WBC: 1.5 10*3/uL — ABNORMAL LOW (ref 4.0–10.5)
nRBC: 3.4 % — ABNORMAL HIGH (ref 0.0–0.2)

## 2018-12-19 LAB — URINALYSIS, ROUTINE W REFLEX MICROSCOPIC
Bacteria, UA: NONE SEEN
Bilirubin Urine: NEGATIVE
Glucose, UA: NEGATIVE mg/dL
Ketones, ur: NEGATIVE mg/dL
Leukocytes,Ua: NEGATIVE
Nitrite: NEGATIVE
Protein, ur: 30 mg/dL — AB
RBC / HPF: >50 RBC/hpf — ABNORMAL HIGH (ref 0–5)
Specific Gravity, Urine: 1.026 (ref 1.005–1.030)
WBC, UA: >50 WBC/hpf — ABNORMAL HIGH (ref 0–5)
pH: 5 (ref 5.0–8.0)

## 2018-12-19 LAB — SAMPLE TO BLOOD BANK

## 2018-12-19 MED ORDER — DIPHENHYDRAMINE HCL 25 MG PO CAPS
25.0000 mg | ORAL_CAPSULE | Freq: Once | ORAL | Status: AC
  Administered 2018-12-19: 25 mg via ORAL
  Filled 2018-12-19: qty 1

## 2018-12-19 MED ORDER — SODIUM CHLORIDE 0.9% IV SOLUTION
250.0000 mL | Freq: Once | INTRAVENOUS | Status: AC
  Administered 2018-12-19: 250 mL via INTRAVENOUS

## 2018-12-19 MED ORDER — HEPARIN SOD (PORK) LOCK FLUSH 100 UNIT/ML IV SOLN
500.0000 [IU] | Freq: Every day | INTRAVENOUS | Status: AC | PRN
  Administered 2018-12-19: 500 [IU]

## 2018-12-19 MED ORDER — ACETAMINOPHEN 325 MG PO TABS
650.0000 mg | ORAL_TABLET | Freq: Once | ORAL | Status: AC
  Administered 2018-12-19: 13:00:00 650 mg via ORAL
  Filled 2018-12-19: qty 2

## 2018-12-19 MED ORDER — SODIUM CHLORIDE 0.9% FLUSH: 10.0000 mL | INTRAVENOUS | Status: DC | PRN

## 2018-12-19 NOTE — Progress Notes (Signed)
CRITICAL VALUE ALERT Critical value received:  Platelets 13,000 Date of notification:  12/19/2018 Time of notification: 1155 Critical value read back:  Yes.   Nurse who received alert:  Isidoro Donning RN K. Harriet Pho NP notified and she ordered 1 unit of platelets to be administered today.

## 2018-12-19 NOTE — Progress Notes (Signed)
Patient tolerated platelet transfusion with no complaints voiced.  Port site clean and dry with pressure dressing applied.  Left ambulatory with VSS and no s/s of distress noted.

## 2018-12-20 LAB — BPAM PLATELET PHERESIS
Blood Product Expiration Date: 202008152359
ISSUE DATE / TIME: 202008131456
Unit Type and Rh: 5100

## 2018-12-20 LAB — PREPARE PLATELET PHERESIS: Unit division: 0

## 2018-12-24 ENCOUNTER — Other Ambulatory Visit (HOSPITAL_COMMUNITY): Payer: Self-pay | Admitting: Hematology

## 2018-12-24 DIAGNOSIS — M25559 Pain in unspecified hip: Secondary | ICD-10-CM

## 2018-12-30 ENCOUNTER — Ambulatory Visit (HOSPITAL_COMMUNITY): Payer: Medicare Other | Admitting: Hematology

## 2018-12-30 ENCOUNTER — Other Ambulatory Visit (HOSPITAL_COMMUNITY): Payer: Medicare Other

## 2018-12-30 ENCOUNTER — Ambulatory Visit (HOSPITAL_COMMUNITY): Payer: Medicare Other

## 2018-12-31 ENCOUNTER — Ambulatory Visit (HOSPITAL_COMMUNITY): Payer: Medicare Other

## 2019-01-01 ENCOUNTER — Ambulatory Visit (HOSPITAL_COMMUNITY): Payer: Medicare Other

## 2019-01-02 ENCOUNTER — Other Ambulatory Visit (HOSPITAL_COMMUNITY): Payer: Self-pay | Admitting: *Deleted

## 2019-01-02 ENCOUNTER — Ambulatory Visit (HOSPITAL_COMMUNITY): Payer: Medicare Other

## 2019-01-02 DIAGNOSIS — M25559 Pain in unspecified hip: Secondary | ICD-10-CM

## 2019-01-02 MED ORDER — TRAMADOL HCL 50 MG PO TABS: 50.0000 mg | ORAL_TABLET | Freq: Three times a day (TID) | ORAL | 0 refills | Status: DC | PRN

## 2019-01-03 ENCOUNTER — Ambulatory Visit (HOSPITAL_COMMUNITY): Payer: Medicare Other

## 2019-01-06 ENCOUNTER — Other Ambulatory Visit (HOSPITAL_COMMUNITY): Payer: Medicare Other

## 2019-01-06 ENCOUNTER — Ambulatory Visit (HOSPITAL_COMMUNITY): Payer: Medicare Other

## 2019-01-07 ENCOUNTER — Other Ambulatory Visit: Payer: Self-pay

## 2019-01-07 ENCOUNTER — Ambulatory Visit (HOSPITAL_COMMUNITY): Payer: Medicare Other

## 2019-01-07 ENCOUNTER — Inpatient Hospital Stay (HOSPITAL_COMMUNITY): Payer: Medicare Other

## 2019-01-07 ENCOUNTER — Encounter (HOSPITAL_COMMUNITY): Payer: Self-pay | Admitting: Hematology

## 2019-01-07 ENCOUNTER — Inpatient Hospital Stay (HOSPITAL_COMMUNITY): Payer: Medicare Other | Attending: Hematology | Admitting: Hematology

## 2019-01-07 VITALS — BP 138/78 | HR 76 | Temp 97.1°F | Resp 18

## 2019-01-07 DIAGNOSIS — N189 Chronic kidney disease, unspecified: Secondary | ICD-10-CM | POA: Insufficient documentation

## 2019-01-07 DIAGNOSIS — D61818 Other pancytopenia: Secondary | ICD-10-CM | POA: Insufficient documentation

## 2019-01-07 DIAGNOSIS — Z79899 Other long term (current) drug therapy: Secondary | ICD-10-CM | POA: Insufficient documentation

## 2019-01-07 DIAGNOSIS — M25562 Pain in left knee: Secondary | ICD-10-CM | POA: Insufficient documentation

## 2019-01-07 DIAGNOSIS — C9200 Acute myeloblastic leukemia, not having achieved remission: Secondary | ICD-10-CM | POA: Insufficient documentation

## 2019-01-07 DIAGNOSIS — D649 Anemia, unspecified: Secondary | ICD-10-CM

## 2019-01-07 DIAGNOSIS — M549 Dorsalgia, unspecified: Secondary | ICD-10-CM | POA: Insufficient documentation

## 2019-01-07 DIAGNOSIS — D46Z Other myelodysplastic syndromes: Secondary | ICD-10-CM

## 2019-01-07 DIAGNOSIS — M25561 Pain in right knee: Secondary | ICD-10-CM | POA: Diagnosis not present

## 2019-01-07 LAB — CBC WITH DIFFERENTIAL/PLATELET
Abs Immature Granulocytes: 0.01 10*3/uL (ref 0.00–0.07)
Basophils Absolute: 0 10*3/uL (ref 0.0–0.1)
Basophils Relative: 0 %
Eosinophils Absolute: 0 10*3/uL (ref 0.0–0.5)
Eosinophils Relative: 0 %
HCT: 30.6 % — ABNORMAL LOW (ref 39.0–52.0)
Hemoglobin: 8.4 g/dL — ABNORMAL LOW (ref 13.0–17.0)
Immature Granulocytes: 1 %
Lymphocytes Relative: 57 %
Lymphs Abs: 1.1 10*3/uL (ref 0.7–4.0)
MCH: 30 pg (ref 26.0–34.0)
MCHC: 27.5 g/dL — ABNORMAL LOW (ref 30.0–36.0)
MCV: 109.3 fL — ABNORMAL HIGH (ref 80.0–100.0)
Monocytes Absolute: 0.1 10*3/uL (ref 0.1–1.0)
Monocytes Relative: 5 %
Neutro Abs: 0.7 10*3/uL — ABNORMAL LOW (ref 1.7–7.7)
Neutrophils Relative %: 37 %
Platelets: 46 10*3/uL — ABNORMAL LOW (ref 150–400)
RBC: 2.8 MIL/uL — ABNORMAL LOW (ref 4.22–5.81)
RDW: 19.9 % — ABNORMAL HIGH (ref 11.5–15.5)
WBC: 2 10*3/uL — ABNORMAL LOW (ref 4.0–10.5)
nRBC: 6.6 % — ABNORMAL HIGH (ref 0.0–0.2)

## 2019-01-07 LAB — COMPREHENSIVE METABOLIC PANEL
ALT: 21 U/L (ref 0–44)
AST: 24 U/L (ref 15–41)
Albumin: 3.9 g/dL (ref 3.5–5.0)
Alkaline Phosphatase: 51 U/L (ref 38–126)
Anion gap: 8 (ref 5–15)
BUN: 24 mg/dL — ABNORMAL HIGH (ref 8–23)
CO2: 26 mmol/L (ref 22–32)
Calcium: 9.5 mg/dL (ref 8.9–10.3)
Chloride: 107 mmol/L (ref 98–111)
Creatinine, Ser: 1.36 mg/dL — ABNORMAL HIGH (ref 0.61–1.24)
GFR calc Af Amer: 59 mL/min — ABNORMAL LOW (ref >60)
GFR calc non Af Amer: 51 mL/min — ABNORMAL LOW (ref >60)
Glucose, Bld: 119 mg/dL — ABNORMAL HIGH (ref 70–99)
Potassium: 4.9 mmol/L (ref 3.5–5.1)
Sodium: 141 mmol/L (ref 135–145)
Total Bilirubin: 1.3 mg/dL — ABNORMAL HIGH (ref 0.3–1.2)
Total Protein: 6.6 g/dL (ref 6.5–8.1)

## 2019-01-07 LAB — PREPARE RBC (CROSSMATCH)

## 2019-01-07 MED ORDER — HEPARIN SOD (PORK) LOCK FLUSH 100 UNIT/ML IV SOLN
500.0000 [IU] | Freq: Every day | INTRAVENOUS | Status: AC | PRN
  Administered 2019-01-07: 500 [IU]

## 2019-01-07 MED ORDER — SODIUM CHLORIDE 0.9% FLUSH
10.0000 mL | INTRAVENOUS | Status: AC | PRN
  Administered 2019-01-07: 10 mL

## 2019-01-07 MED ORDER — ACETAMINOPHEN 325 MG PO TABS
650.0000 mg | ORAL_TABLET | Freq: Once | ORAL | Status: AC
  Administered 2019-01-07: 650 mg via ORAL
  Filled 2019-01-07: qty 2

## 2019-01-07 MED ORDER — SODIUM CHLORIDE 0.9% IV SOLUTION
250.0000 mL | Freq: Once | INTRAVENOUS | Status: AC
  Administered 2019-01-07: 250 mL via INTRAVENOUS

## 2019-01-07 MED ORDER — DIPHENHYDRAMINE HCL 25 MG PO CAPS
25.0000 mg | ORAL_CAPSULE | Freq: Once | ORAL | Status: AC
  Administered 2019-01-07: 25 mg via ORAL
  Filled 2019-01-07: qty 1

## 2019-01-07 NOTE — Progress Notes (Signed)
  Oncology Nurse Navigator Documentation  Navigator Location: [place] [facility] (01/07/19 1200)   )Navigator Encounter Type: Treatment (01/07/19 1200) Treatment Phase: Treatment (01/07/19 1200) Barriers/Navigation Needs: Coordination of Care;Education (01/07/19 1200) Education: Understanding Cancer/ Treatment Options;Coping with Diagnosis/ Prognosis (01/07/19 1200) Interventions: Education (01/07/19 1200)     Education Method: Verbal;Written (01/07/19 1200)      Acuity: Level 2 (01/07/19 1200)   Acuity Level 2: Educational needs;Assistance expediting appointments;Other (01/07/19 1200)     Time Spent with Patient: 30 (01/07/19 1200)   I met with patient during the visit with Dr. Delton Coombes. We reviewed the treatment plan presented by Dr. Delton Coombes today.  I reviewed with him the process of obtaining the new chemotherapy pill . I talked with him about the process and to expect a phone call from the specialty pharmacy.  I provided him with written educational material on the two new chemotherapy medications.  We will provide a more detailed education class on the date of start.  Patient was given the opportunity to ask questions and all were answered to his satisfaction.

## 2019-01-07 NOTE — Progress Notes (Signed)
[facility] [address] [place] [address]   CLINIC:  Medical Oncology/Hematology  PCP:  [name], MD [address] [place] [address] [phone]   REASON FOR VISIT:  Follow-up for newly diagnosed AML.  CURRENT THERAPY: Vidaza 4 cycles through 12/02/2018.  BRIEF ONCOLOGIC HISTORY:  Oncology History  MDS (myelodysplastic syndrome), high grade (HCC)  08/14/2018 Initial Diagnosis   MDS (myelodysplastic syndrome), high grade ([place])   08/22/2018 -  Chemotherapy   The patient had palonosetron (ALOXI) injection 0.25 mg, 0.25 mg, Intravenous,  Once, 4 of 6 cycles Administration: 0.25 mg (08/22/2018), 0.25 mg (08/26/2018), 0.25 mg (08/28/2018), 0.25 mg (08/30/2018), 0.25 mg (10/01/2018), 0.25 mg (10/02/2018), 0.25 mg (11/04/2018), 0.25 mg (09/23/2018), 0.25 mg (09/25/2018), 0.25 mg (09/27/2018), 0.25 mg (10/28/2018), 0.25 mg (10/30/2018), 0.25 mg (11/01/2018), 0.25 mg (12/02/2018), 0.25 mg (12/04/2018), 0.25 mg (12/06/2018) azaCITIDine (VIDAZA) 100 mg in sodium chloride 0.9 % 50 mL chemo infusion, 110 mg (66.7 % of original dose 75 mg/m2), Intravenous, Once, 4 of 6 cycles Dose modification: 50 mg/m2 (original dose 75 mg/m2, Cycle 1, Reason: Provider Judgment), 50 mg/m2 (original dose 75 mg/m2, Cycle 2, Reason: Provider Judgment) Administration: 100 mg (08/22/2018), 100 mg (08/23/2018), 100 mg (08/26/2018), 100 mg (08/27/2018), 100 mg (08/28/2018), 100 mg (08/29/2018), 100 mg (08/30/2018), 100 mg (10/01/2018), 100 mg (10/02/2018), 100 mg (11/04/2018), 100 mg (11/05/2018), 100 mg (09/23/2018), 100 mg (09/24/2018), 100 mg (09/25/2018), 100 mg (09/26/2018), 100 mg (09/27/2018), 100 mg (10/28/2018), 100 mg (10/29/2018), 100 mg (10/30/2018), 100 mg (10/31/2018), 100 mg (11/01/2018), 100 mg (12/02/2018), 100 mg (12/03/2018), 100 mg (12/04/2018), 100 mg (12/05/2018), 100 mg (12/06/2018)  for chemotherapy treatment.       CANCER STAGING: Cancer Staging No matching staging information was found for the  patient.   INTERVAL HISTORY:  Richard Wallace 73 y.o. male seen for follow-up for his MDS.  He went to see Dr. Leretha Pol at [facility] for second opinion.  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  His last platelet transfusion was on 12/19/2018.  He complains of feeling dizzy when he stands up.  He also feels easily short winded.  Also complains of extreme fatigue.  Appetite is 100%.  Denies any fevers or infections.  His pain in the knees is poorly controlled with Tylenol and tramadol.  Pain is well controlled as long as he is sitting.  Once he moves, pain gets worse.  REVIEW OF SYSTEMS:  Review of Systems  Constitutional: Positive for fatigue.  Respiratory: Positive for shortness of breath.   Neurological: Positive for dizziness.  All other systems reviewed and are negative.    PAST MEDICAL/SURGICAL HISTORY:  Past Medical History:  Diagnosis Date  . Arthritis   . 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 Cancer ([place]) 01-28-2014   skin cancer  . CKD (chronic kidney disease), stage III ([place])   . GERD (gastroesophageal reflux disease)   . Heart murmur    NOTED DURING PHYSICAL WHEN HE WAS ENLISTING IN [facility] , DIDNT KNOW UNTIL THAT TIME AND REPORTS , "THATS THE LAST I HEARD ABOUT IT "   . History of hypertension    no longer issue  . History of kidney stones   . History of malignant melanoma of skin    excision top of scalp 2015-- no recurrence  . History of urinary retention    post op lumbar fusion surgery 04/ 2016  .  Hypertension   . 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 Left ureteral calculus   . Seasonal allergies   . Wears glasses   . Wears glasses   . Wears partial dentures    upper and lower   Past Surgical History:  Procedure Laterality Date  . ANKLE FUSION Right 2007  . CARPAL TUNNEL RELEASE Left 12/28/2009   w/ pulley release left  long finger  . CARPAL TUNNEL RELEASE Right 07/22/2013   Procedure: RIGHT CARPAL TUNNEL RELEASE;  Surgeon: Cammie Sickle., MD;  Location: [place];  Service: Orthopedics;  Laterality: Right;  . COLONOSCOPY    . CYSTO/  LEFT RETROGRADE PYELOGRAM  11/21/2010  . CYSTOSCOPY WITH STENT PLACEMENT Left 03/09/2016   Procedure: CYSTOSCOPY WITH STENT PLACEMENT;  Surgeon: Franchot Gallo, MD;  Location: [facility];  Service: Urology;  Laterality: Left;  . CYSTOSCOPY/RETROGRADE/URETEROSCOPY/STONE EXTRACTION WITH BASKET Left 03/09/2016   Procedure: CYSTOSCOPY/RETROGRADE/URETEROSCOPY/STONE EXTRACTION WITH BASKET;  Surgeon: Franchot Gallo, MD;  Location: [facility];  Service: Urology;  Laterality: Left;  . LEFT URETEROSCOPIC LASER LITHOTRIPSY STONE EXTRACTION/ STENT PLACEMENT  05/23/2010  . MOHS SURGERY     TOP OF THE HEAD   . ORIF ANKLE FRACTURE Right 1978  . PORTACATH PLACEMENT Right 08/19/2018   Procedure: INSERTION PORT-A-CATH (attached catheter in right subclavian);  Surgeon: Aviva Signs, MD;  Location: [facility];  Service: General;  Laterality: Right;  . POSTERIOR LUMBAR FUSION  08/21/2014   laminectomy and decompression L2 -- L5  . RIGHT LOWER LEG SURGERY  X3  1975 to 1976   including ORIF  . TONSILLECTOMY AND ADENOIDECTOMY  1986  . UMBILICAL HERNIA REPAIR  2009 approx     SOCIAL HISTORY:  Social History   Socioeconomic History  . Marital status: Married    Spouse name: Not on file  . Number of children: Not on file  . Years of education: Not on file  . Highest education level: Not on file  Occupational History  . Not on file  Social Needs  . Financial resource strain: Not on file  . Food insecurity    Worry: Not on file    Inability: Not on file  . Transportation needs    Medical: Not on file    Non-medical: Not on file  Tobacco Use  . Smoking status: Former Smoker    Years: 20.00    Types: Cigarettes    Quit date: 07/17/1986     Years since quitting: 32.4  . Smokeless tobacco: Never Used  Substance and Sexual Activity  . Alcohol use: Yes    Alcohol/week: 7.0 - 14.0 standard drinks    Types: 7 - 14 Cans of beer per week    Comment: 1 -2 beer daily  . Drug use: No  . Sexual activity: Not on file  Lifestyle  . Physical activity    Days per week: Not on file    Minutes per session: Not on file  . Stress: Not on file  Relationships  . Social Herbalist on phone: Not on file    Gets together: Not on file    Attends religious service: Not on file    Active member of club or organization: Not on file    Attends meetings of clubs or organizations: Not on file    Relationship status: Not on file  . Intimate partner violence  Fear of current or ex partner: Not on file    Emotionally abused: Not on file    Physically abused: Not on file    Forced sexual activity: Not on file  Other Topics Concern  . Not on file  Social History Narrative  . Not on file    FAMILY HISTORY:  Family History  Problem Relation Age of Onset  . Stroke Mother   . Prostate cancer Father   . Bone cancer Father   . Rheum arthritis Sister   . Urinary tract infection Sister     CURRENT MEDICATIONS:  Outpatient Encounter Medications as of 01/07/2019  Medication Sig  . acetaminophen (TYLENOL) 500 MG tablet Take 1,000 mg by mouth every 6 (six) hours as needed for mild pain or moderate pain.   Marland Kitchen allopurinol (ZYLOPRIM) 300 MG tablet Take 300 mg by mouth every evening.  Marland Kitchen azaCITIDine 5 mg/2 mLs in lactated ringers infusion Inject into the vein daily. Day 1-7 every 28 days (starting 08/22/2018)  . celecoxib (CELEBREX) 200 MG capsule Take 200 mg by mouth daily. IN THE MORNING  . cetirizine (ZYRTEC) 10 MG tablet Take 10 mg by mouth daily. IN THE MORNING  . diclofenac sodium (VOLTAREN) 1 % GEL Apply 1 g topically 3 (three) times daily as needed (knee pain.).   Marland Kitchen docusate sodium (COLACE) 100 MG capsule Take 100 mg by mouth at  bedtime.   . fluticasone (FLONASE) 50 MCG/ACT nasal spray Place 1 spray into both nostrils daily. In the morning  . lansoprazole (PREVACID) 15 MG capsule Take 15 mg by mouth daily before breakfast.   . lidocaine-prilocaine (EMLA) cream Apply pea-sized amount to port a cath site and cover with plastic wrap one hour prior to chemotherapy appointment  . magnesium oxide (MAG-OX) 400 MG tablet Take 400 mg by mouth at bedtime.   Vladimir Faster Glycol-Propyl Glycol (SYSTANE OP) Place 1 drop into both eyes 2 (two) times daily.  . prochlorperazine (COMPAZINE) 10 MG tablet Take 1 tablet (10 mg total) by mouth every 6 (six) hours as needed (Nausea or vomiting).  . tamsulosin (FLOMAX) 0.4 MG CAPS capsule Take 0.4 mg by mouth every evening.   . traMADol (ULTRAM) 50 MG tablet Take 1 tablet (50 mg total) by mouth 3 (three) times daily as needed (pain.).  . [DISCONTINUED] lisinopril (PRINIVIL,ZESTRIL) 5 MG tablet Take 2.5 mg by mouth daily. In the morning   No facility-administered encounter medications on file as of 01/07/2019.     ALLERGIES:  No Known Allergies   PHYSICAL EXAM:  ECOG Performance status: 1  Vitals:   01/07/19 1045  BP: (!) 122/59  Pulse: 96  Resp: 18  Temp: 97.9 F (36.6 C)  SpO2: 99%   Filed Weights   01/07/19 1045  Weight: 228 lb (103.4 kg)    Physical Exam Constitutional:      Appearance: Normal appearance.  HENT:     Mouth/Throat:     Mouth: Mucous membranes are moist.     Pharynx: Oropharynx is clear.  Eyes:     Extraocular Movements: Extraocular movements intact.     Conjunctiva/sclera: Conjunctivae normal.  Neck:     Musculoskeletal: Normal range of motion.  Cardiovascular:     Rate and Rhythm: Normal rate and regular rhythm.     Heart sounds: Normal heart sounds.  Pulmonary:     Effort: Pulmonary effort is normal.     Breath sounds: Normal breath sounds.  Abdominal:     General: There is no  distension.     Palpations: Abdomen is soft. There is no mass.   Musculoskeletal: Normal range of motion.        General: No deformity.  Skin:    General: Skin is warm.  Neurological:     General: No focal deficit present.     Mental Status: He is alert and oriented to person, place, and time.  Psychiatric:        Mood and Affect: Mood normal.        Behavior: Behavior normal.      LABORATORY DATA:  I have reviewed the labs as listed.  CBC    Component Value Date/Time   WBC 2.0 (L) 01/07/2019 1013   RBC 2.80 (L) 01/07/2019 1013   HGB 8.4 (L) 01/07/2019 1013   HCT 30.6 (L) 01/07/2019 1013   PLT 46 (L) 01/07/2019 1013   MCV 109.3 (H) 01/07/2019 1013   MCH 30.0 01/07/2019 1013   MCHC 27.5 (L) 01/07/2019 1013   RDW 19.9 (H) 01/07/2019 1013   LYMPHSABS 1.1 01/07/2019 1013   MONOABS 0.1 01/07/2019 1013   EOSABS 0.0 01/07/2019 1013   BASOSABS 0.0 01/07/2019 1013   CMP Latest Ref Rng & Units 01/07/2019 12/02/2018 11/25/2018  Glucose 70 - 99 mg/dL 119(H) 101(H) 122(H)  BUN 8 - 23 mg/dL 24(H) 32(H) 29(H)  Creatinine 0.61 - 1.24 mg/dL 1.36(H) 1.32(H) 1.52(H)  Sodium 135 - 145 mmol/L 141 137 141  Potassium 3.5 - 5.1 mmol/L 4.9 4.7 4.2  Chloride 98 - 111 mmol/L 107 107 110  CO2 22 - 32 mmol/L _0 Calcium 8.9 - 10.3 mg/dL 9.5 9.1 9.0  Total Protein 6.5 - 8.1 g/dL 6.6 6.5 6.1(L)  Total Bilirubin 0.3 - 1.2 mg/dL 1.3(H) 1.3(H) 1.4(H)  Alkaline Phos 38 - 126 U/L 51 47 50  AST 15 - 41 U/L _1 ALT 0 - 44 U/L _2 ASSESSMENT & PLAN:   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HCC) 1.  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  Flow cytometry showed 12% blasts.  Chromosome analysis was 46, XY.  FISH panel for MDS was normal. -4 cycles of azacitidine 50 mg/M square from 08/22/2018 through 12/02/2018, requiring intermittent platelet transfusions and blood transfusions.  Last platelet transfusion was on 12/19/2018. -He was seen at [facility] for second opinion. - Repeat bone marrow biopsy on 12/23/2018 showed 25% blasts in and 90% cellular bone marrow, consistent with AML with MDS related changes.  Flow cytometry showed 11% blasts.  MDS FISH panel was normal. -NGS panel did not show any targetable mutations. - Further therapy with decitabine for 5 days along with venetoclax was recommended.  I agree with the treatment plan. - We have discussed at length about the treatment plan and therapy related side effects. -We will tentatively start his therapy on 01/20/2019.  We will order venetoclax. -We have reviewed his blood counts today.  Hemoglobin is 8.4.  He complains of dizziness and severe fatigue.  We have ordered 1 unit of PRBC.  2.  CKD: -Creatinine stable between 1.3-1.4.  3.  Back pain and knee pains: -He is taking tramadol 3 times a day along with Tylenol.  Pain is not completely improved when he is active. -He is seeing an orthopedic surgeon next week.   Total time spent is 40 minutes with more than 50%  of the time spent face-to-face discussing treatment plan, counseling and coordination of care.  Orders placed this encounter:  No orders of the defined types were placed in this encounter.     [name], MD [place] [phone]

## 2019-01-07 NOTE — Assessment & Plan Note (Signed)
1.  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  Flow cytometry showed 12% blasts.  Chromosome analysis was 46, XY.  FISH panel for MDS was normal. -4 cycles of azacitidine 50 mg/M square from 08/22/2018 through 12/02/2018, requiring intermittent platelet transfusions and blood transfusions.  Last platelet transfusion was on 12/19/2018. -He was seen at [facility] for second opinion. - Repeat bone marrow biopsy on 12/23/2018 showed 25% blasts in and 90% cellular bone marrow, consistent with AML with MDS related changes.  Flow cytometry showed 11% blasts.  MDS FISH panel was normal. -NGS panel did not show any targetable mutations. - Further therapy with decitabine for 5 days along with venetoclax was recommended.  I agree with the treatment plan. - We have discussed at length about the treatment plan and therapy related side effects. -We will tentatively start his therapy on 01/20/2019.  We will order venetoclax. -We have reviewed his blood counts today.  Hemoglobin is 8.4.  He complains of dizziness and severe fatigue.  We have ordered 1 unit of PRBC.  2.  CKD: -Creatinine stable between 1.3-1.4.  3.  Back pain and knee pains: -He is taking tramadol 3 times a day along with Tylenol.  Pain is not completely improved when he is active. -He is seeing an orthopedic surgeon next week.

## 2019-01-07 NOTE — Patient Instructions (Addendum)
[place] at [facility] Discharge Instructions  You were seen today by Dr. Delton Coombes. He went over your recent lab results. According to your recent biopsy you have Leukemia. He would like you to start you on two new medications, information will be given on both of these. He will see you back in 2 weeks for labs and follow up.   Thank you for choosing [place] at [facility] to provide your oncology and hematology care.  To afford each patient quality time with our provider, please arrive at least 15 minutes before your scheduled appointment time.   If you have a lab appointment with the [facility] please come in thru the  Main Entrance and check in at the main information desk  You need to re-schedule your appointment should you arrive 10 or more minutes late.  We strive to give you quality time with our providers, and arriving late affects you and other patients whose appointments are after yours.  Also, if you no show three or more times for appointments you may be dismissed from the clinic at the providers discretion.     Again, thank you for choosing [facility].  Our hope is that these requests will decrease the amount of time that you wait before being seen by our physicians.       _____________________________________________________________  Should you have questions after your visit to [facility], please contact our office at [phone] between the hours of 8:00 a.m. and 4:30 p.m.  Voicemails left after 4:00 p.m. will not be returned until the following business day.  For prescription refill requests, have your pharmacy contact our office and allow 72 hours.    Cancer Center Support Programs:   > Cancer Support Group  2nd Tuesday of the month 1pm-2pm, Journey Room

## 2019-01-08 LAB — BPAM RBC
Blood Product Expiration Date: 202010042359
ISSUE DATE / TIME: 202009011311
Unit Type and Rh: 5100

## 2019-01-08 LAB — TYPE AND SCREEN
ABO/RH(D): O POS
Antibody Screen: NEGATIVE
Unit division: 0

## 2019-01-08 NOTE — Patient Instructions (Addendum)
Decitabine (Dacogen)  About This Drug Decitabine is used to treat cancer. It is given in the vein (IV).  Possible Side Effects  Bone marrow suppression. This is a decrease in the number of white blood cells, red blood cells,  and platelets. This may raise your risk of infection, make you tired and weak (fatigue), and raise  your risk of bleeding.  Fever  Fatigue  Fever  Nausea  Cough  Petechiae  (Tiny red dots on your skin, called petechiae [pe- TEEK- ee- ay].  Can occur with low platelet count.)  Constipation  Diarrhea  Hyperglycemia - high blood glucose levels  These are less common side effects for patients receiving Decitabine:   Headache  Difficulty sleeping  Swelling   Low albumin   Low magnesium   Chills   Low potassium   Bruising   Rash   Low sodium   Dizziness   Generalized aches and pains  Cardiac murmur   Poor appetite   Sore throat   Abdominal pain   High bilirubin blood level  High potassium   Mouth sores  Drowsiness   Abnormal liver function blood tests  Confusion   Anxiety   Itching   Heartburn     Note: Each of the side effects above was reported in 50% or greater of patients treated with decitabine. Not all possible side effects are included above.  Warnings and Precautions  Severe bone marrow suppression  Important information  This drug may be present in the saliva, tears, sweat, urine, stool, vomit, semen, and vaginal secretions. Talk to your doctor and/or your nurse about the necessary precautions to take during this time.  Treating Side Effects  Manage tiredness by pacing your activities for the day.  Be sure to include periods of rest between energy-draining activities.  To decrease the risk of infection, wash your hands regularly.  Avoid close contact with people who have a cold, the flu, or other infections.  Take your temperature as your doctor or nurse tells you, and whenever you feel  like you may have a fever.  To help decrease your risk of bleeding, use a soft toothbrush. Check with your nurse before using dental floss.  Be very careful when using knives or tools.  Use an electric shaver instead of a razor.  Drink plenty of fluids (a minimum of eight glasses per day is recommended).  Food and Drug Interactions  There are no known interactions of decitabine with food.  This drug may interact with other medicines. Tell your doctor and pharmacist about all the  prescription and over-the-counter medicines and dietary supplements (vitamins, minerals, herbs  and others) that you are taking at this time. Also, check with your doctor or pharmacist before  starting any new prescription or over-the-counter medicines, or dietary supplements to make sure that there are no interactions.  When to Call the Doctor  Contact your health care provider immediately, day or night, if you should experience any of the following symptoms:   Fever of 100.4 F (38 C) or higher, chills (possible signs of infection)  The following symptoms require medical attention, but are not an emergency. Contact your health care provider within 24 hours of noticing any of the following:   Nausea (interferes with ability to eat and unrelieved with prescribed medication).  Vomiting (vomiting more than 4-5 times in a 24 hour period).  Diarrhea (4-6 episodes in a 24-hour period).  Unusual bleeding or bruising  Black or tarry stools, or blood in  your stools.  Blood in the urine.  Pain or burning with urination.  Extreme fatigue (unable to carry on self-care activities).  Mouth sores (painful redness, swelling or ulcers).  Reproduction Warnings  Pregnancy warning: This drug can have harmful effects on the unborn baby. Women of  childbearing potential should use effective methods of birth control during your cancer treatment  and for 6 months after treatment. Men with male partners of  childbearing potential should  use effective methods of birth control during your cancer treatment and for 3 months after your  cancer treatment. Let your doctor know right away if you think you may be pregnant or may have  impregnated your partner.  Breastfeeding warning: Women should not breastfeed during treatment and for at least 1 week after treatment because this drug could enter the breast milk and cause harm to a breastfeeding baby.  Fertility warning: In men, this drug may affect your ability to have children in the future. Talk with       your doctor or nurse if you plan to have children. Ask for information on sperm banking.   Venetoclax (Venclexta)  About This Drug Venetoclax is used to treat cancer. It is given orally (by mouth).  Possible Side Effects  Bone marrow suppression. This is a decrease in the number of white blood cells, red blood cells,       and platelets. This may raise your risk of infection, make you tired and weak (fatigue), and raise       your risk of bleeding.  Nausea and vomiting  Diarrhea (loose bowel movements)  Constipation (not able to move bowels)  Pain in your mouth/throat  Pain in your abdomen  Abnormal bleeding - symptoms may be coughing up blood, throwing up blood (may look like coffee       grounds), red or black tarry bowel movements, abnormally heavy menstrual flow, nosebleeds or       any other unusual bleeding.  Fever and fever in the setting of decreased white blood cells, which is a serious condition that can       be life-threatening  Tiredness  Feeling dizzy  Infections, including upper respiratory infection, blood infection, and pneumonia  Cough  Trouble breathing  Bone and muscle pain  Back pain  Swelling of your legs, ankles and/or feet  Rash  Low blood pressure  Note: Each of the side effects above was reported in 20% or greater of patients treated with venetoclax in combination with other  medications. Not all possible side effects are included above and the side effects you experience may be different depending on the combination of medications.  Warnings and Precautions  Tumor lysis syndrome: This drug may act on the cancer cells very quickly. This may affect how your       kidneys work and can be life-threatening.  Severe decrease in the number of white blood cells. This may raise your risk of infection.  Severe infections, including viral, bacterial and fungal, which can be life-threatening.  Note: Some of the side effects above are very rare. If you have concerns and/or questions, please discuss them with your medical team.  Important Information  Talk to your doctor before receiving any vaccinations during your treatment. Some vaccinations are       not recommended while receiving venetoclax.  How to Take Your Medication  Swallow the medicine whole with a meal and water at approximately the same time each day. Do  not chew, break, cut or crush it.  . Missed dose: If you have missed your dose by more than 8 hours, skip the missed dose and  continue with your regular dosing schedule, and contact your physician. If you have missed a dose  and it is within 8 hours of the time you usually take your dose, take the missed dose as soon as  possible and then continue with your regular dosing schedule.  . If you vomit a dose, take your next dose at the regular time. Do not take 2 doses at the same time  or extra doses.  Handling: Wash your hands after handling your medicine. Your caretakers should not handle your medicine        with bare hands and should wear latex gloves.  This drug may be present in the saliva, tears, sweat, urine, stool, vomit, semen, and vaginal       secretions. Talk to your doctor and/or your nurse about the necessary precautions to take during this time.  Storage: Store this medicine in the original container at room  temperature.  Disposal of unused medicine: Do not flush any expired and/or unused medicine down the toilet       or drain unless you are specifically instructed to do so on the medication label. Some facilities have       take-back programs and/or other options. If you do not have a take-back program in your area, then       please discuss with your nurse or your doctor how to dispose of unused medicine.  Treating Side Effects  Manage tiredness by pacing your activities for the day.  Be sure to include periods of rest between energy-draining activities.  If you are dizzy, get up slowly after sitting or lying.  To decrease the risk of infection, wash your hands regularly.  Avoid close contact with people who have a cold, the flu, or other infections.  Take your temperature as your doctor or nurse tells you, and whenever you feel like you may have       a fever.  To help decrease the risk of bleeding, use a soft toothbrush. Check with your nurse before using       dental floss.  Be very careful when using knives or tools.  Use an electric shaver instead of a razor.  Drink plenty of fluids (a minimum of eight glasses per day is recommended).  If you throw up or have loose bowel movements, you should drink more fluids so that you do not       become dehydrated (lack of water in the body from losing too much fluid).  If you have diarrhea, eat low-fiber foods that are high in protein and calories and avoid foods that  can irritate your digestive tracts or lead to cramping.  If you are not able to move your bowels, check with your doctor or nurse before you use enemas,  laxatives, or suppositories.  Ask your nurse or doctor about medicine that can lessen or stop your diarrhea and/or constipation.  To help with nausea and vomiting, eat small, frequent meals instead of three large meals a day.  Choose foods and drinks that are at room temperature. Ask your nurse or doctor about  other helpful       tips and medicine that is available to help stop or lessen these symptoms.  Get regular exercise. If you feel too tired to exercise vigorously, try taking a short walk.  Keeping your pain under control is important to your well-being. Please tell your doctor or nurse if       you are experiencing pain.  If you get a rash do not put anything on it unless your doctor or nurse says you may. Keep the area       around the rash clean and dry. Ask your doctor for medicine if your rash bothers you.  Food and Drug Interactions  Avoid grapefruit products, [place] oranges, and starfruit while taking this medicine. These foods       may raise the levels of venetoclax in your body. This could make side effects worse.  Check with your doctor or pharmacist about all other prescription medicines and over-the-counter       medicines and dietary supplements (vitamins, minerals, herbs and others) you are taking before       starting this medicine as there are known drug interactions with venetoclax. Also, check with your       doctor or pharmacist before starting any new prescription or over-the-counter medicines, or dietary       supplements to make sure that there are no interactions.  Avoid the use of St. John's Wort while taking venetoclax as this may lower the levels of the drug in       your body, which can make it less effective.  There are known interactions of venetoclax with blood thinning medicine such as warfarin. Ask your       doctor what precautions you should take.  When to Call the Doctor Call your doctor or nurse if you have any of these symptoms and/or any new or unusual symptoms:   Fever of 100.4 F (38 C) or higher  Chills  Tiredness that interferes with your daily activities  Feeling dizzy or lightheaded  Feeling that your heart is beating a fast or in a not normal way (palpitations)  Fast breathing  Easy bleeding or bruising  Blood in your  urine, vomit (bright red or coffee-ground) and/or stools (bright red, or black/tarry)  Coughing up blood  Wheezing or trouble breathing  Coughing up yellow, green, or bloody mucus  Nausea that stops you from eating or drinking and/or is not relieved by prescribed medicines  Throwing up more than 2 times a day  Diarrhea, 4 times in one day or diarrhea with lack of strength or a feeling of being dizzy  No bowel movement in 3 days or when you feel uncomfortable  Signs of tumor lysis: Confusion or agitation, decreased urine, nausea/vomiting, diarrhea, muscle       cramping, numbness and/or tingling, seizures.  Weight gain of 5 pounds in one week (fluid retention)  Swelling of your legs, ankles and/or feet  Pain that does not go away or is not relieved by prescribed medicine  Extreme weakness that interferes with normal activities  New rash and/or itching  Rash that is not relieved by prescribed medicines  If you think you may be pregnant  Reproduction Warnings  Pregnancy warning: This drug can have harmful effects on the unborn baby. Women of       childbearing potential should use effective methods of birth control during your cancer treatment       and for at least 30 days after treatment. Let your doctor know right away if you think you may be       pregnant.  Breastfeeding warning: It is not known if this drug passes into breast milk. For this reason,  women should not breastfeed during treatment because this drug could enter the breast milk and       cause harm to a breastfeeding baby.  Fertility warning: In men, this drug may affect your ability to have children in the future. Talk with       your doctor or nurse if you plan to have children. Ask for information on sperm banking.

## 2019-01-14 ENCOUNTER — Ambulatory Visit (HOSPITAL_COMMUNITY): Payer: Medicare Other | Admitting: Hematology

## 2019-01-14 ENCOUNTER — Ambulatory Visit (HOSPITAL_COMMUNITY): Payer: Medicare Other

## 2019-01-14 ENCOUNTER — Other Ambulatory Visit (HOSPITAL_COMMUNITY): Payer: Medicare Other

## 2019-01-14 ENCOUNTER — Other Ambulatory Visit (HOSPITAL_COMMUNITY): Payer: Self-pay | Admitting: Hematology

## 2019-01-14 DIAGNOSIS — C9200 Acute myeloblastic leukemia, not having achieved remission: Secondary | ICD-10-CM

## 2019-01-14 MED ORDER — VENETOCLAX 100 MG PO TABS: 400.0000 mg | ORAL_TABLET | Freq: Every day | ORAL | 0 refills | Status: DC

## 2019-01-15 ENCOUNTER — Telehealth (HOSPITAL_COMMUNITY): Payer: Self-pay | Admitting: Pharmacy Technician

## 2019-01-15 ENCOUNTER — Telehealth (HOSPITAL_COMMUNITY): Payer: Self-pay | Admitting: Pharmacist

## 2019-01-15 ENCOUNTER — Ambulatory Visit (HOSPITAL_COMMUNITY): Payer: Medicare Other

## 2019-01-15 NOTE — Telephone Encounter (Signed)
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  CMP/CBC from 01/07/2019 assessed, no relevant lab abnormalities. Recommend repeating monitoring TLS labs pre-initiation and periodically during dose escalation (uric acid, phos, CMP). Prescription dose and frequency assessed.   Current medication list in Epic reviewed, no DDIs with venetoclax identified.  Prescription has been e-scribed to the [facility] for benefits analysis and approval.  Oral Oncology Clinic will continue to follow for insurance authorization, copayment issues, initial counseling and start date.  Darl Pikes, PharmD, BCPS, [facility] H[facility]ology/Oncology Clinical Pharmacist [facility] [place] [facility] [phone]  01/15/2019 1:58 PM

## 2019-01-15 NOTE — Telephone Encounter (Signed)
Oral Oncology Patient Advocate Encounter  Prior Authorization for Lynita Lombard has been approved.    PA# [id number] Effective dates: 01/15/2019 - until further notice.  Patients co-pay is $2676.23.  Will apply for copay assistance to reduce out of pocket cost to $0.00  Oral Oncology Clinic will continue to follow.   [place] Patient [place] Phone [phone] Fax [phone] 01/15/2019 2:11 PM

## 2019-01-15 NOTE — Telephone Encounter (Signed)
Oral Oncology Patient Advocate Encounter  Received notification from [facility] that prior authorization for Venclexta is required.  PA submitted on CoverMyMeds Key [id number]  Status is pending  Oral Oncology Clinic will continue to follow.  [place] Patient [place] Phone [phone] Fax [phone] 01/15/2019 1:35 PM

## 2019-01-15 NOTE — Telephone Encounter (Signed)
Oral Oncology Patient Advocate Encounter  Was successful in securing patient a $10,000 grant from [employer] to provide copayment coverage for [place].  This will keep the out of pocket expense at $0.    [employer] ID: [id number]  I have spoken with the patient.   The billing information is as follows and has been shared with [place].    RxBin: [id number] PCN: [id number] Member ID: [id number] Group ID: [id number] Dates of Eligibility: 12/16/2018 through 12/15/2019  [place] Patient [place] Phone [phone] Fax [phone] 01/15/2019 2:49 PM

## 2019-01-16 ENCOUNTER — Ambulatory Visit (HOSPITAL_COMMUNITY): Payer: Medicare Other

## 2019-01-16 NOTE — Telephone Encounter (Signed)
Scheduled Venclexta to be delivered to patient from [facility] on 9/11.

## 2019-01-17 ENCOUNTER — Ambulatory Visit (HOSPITAL_COMMUNITY): Payer: Medicare Other

## 2019-01-20 ENCOUNTER — Inpatient Hospital Stay (HOSPITAL_BASED_OUTPATIENT_CLINIC_OR_DEPARTMENT_OTHER): Payer: Medicare Other | Admitting: Hematology

## 2019-01-20 ENCOUNTER — Other Ambulatory Visit: Payer: Self-pay

## 2019-01-20 ENCOUNTER — Encounter (HOSPITAL_COMMUNITY): Payer: Self-pay | Admitting: Hematology

## 2019-01-20 ENCOUNTER — Inpatient Hospital Stay (HOSPITAL_COMMUNITY): Payer: Medicare Other

## 2019-01-20 ENCOUNTER — Other Ambulatory Visit (HOSPITAL_COMMUNITY): Payer: Medicare Other

## 2019-01-20 ENCOUNTER — Ambulatory Visit (HOSPITAL_COMMUNITY): Payer: Medicare Other

## 2019-01-20 VITALS — BP 124/69 | HR 91 | Temp 96.9°F | Resp 14 | Wt 226.4 lb

## 2019-01-20 VITALS — BP 142/75 | HR 64

## 2019-01-20 DIAGNOSIS — C9200 Acute myeloblastic leukemia, not having achieved remission: Secondary | ICD-10-CM

## 2019-01-20 DIAGNOSIS — D61818 Other pancytopenia: Secondary | ICD-10-CM

## 2019-01-20 DIAGNOSIS — D46Z Other myelodysplastic syndromes: Secondary | ICD-10-CM

## 2019-01-20 DIAGNOSIS — Z95828 Presence of other vascular implants and grafts: Secondary | ICD-10-CM

## 2019-01-20 LAB — CBC WITH DIFFERENTIAL/PLATELET
Abs Immature Granulocytes: 0.02 10*3/uL (ref 0.00–0.07)
Basophils Absolute: 0 10*3/uL (ref 0.0–0.1)
Basophils Relative: 0 %
Eosinophils Absolute: 0 10*3/uL (ref 0.0–0.5)
Eosinophils Relative: 0 %
HCT: 29.3 % — ABNORMAL LOW (ref 39.0–52.0)
Hemoglobin: 8.4 g/dL — ABNORMAL LOW (ref 13.0–17.0)
Immature Granulocytes: 1 %
Lymphocytes Relative: 50 %
Lymphs Abs: 1.2 10*3/uL (ref 0.7–4.0)
MCH: 30.7 pg (ref 26.0–34.0)
MCHC: 28.7 g/dL — ABNORMAL LOW (ref 30.0–36.0)
MCV: 106.9 fL — ABNORMAL HIGH (ref 80.0–100.0)
Monocytes Absolute: 0.3 10*3/uL (ref 0.1–1.0)
Monocytes Relative: 10 %
Neutro Abs: 1 10*3/uL — ABNORMAL LOW (ref 1.7–7.7)
Neutrophils Relative %: 39 %
Platelets: 59 10*3/uL — ABNORMAL LOW (ref 150–400)
RBC: 2.74 MIL/uL — ABNORMAL LOW (ref 4.22–5.81)
RDW: 18.6 % — ABNORMAL HIGH (ref 11.5–15.5)
WBC: 2.5 10*3/uL — ABNORMAL LOW (ref 4.0–10.5)
nRBC: 4.9 % — ABNORMAL HIGH (ref 0.0–0.2)

## 2019-01-20 LAB — COMPREHENSIVE METABOLIC PANEL
ALT: 24 U/L (ref 0–44)
AST: 23 U/L (ref 15–41)
Albumin: 3.7 g/dL (ref 3.5–5.0)
Alkaline Phosphatase: 55 U/L (ref 38–126)
Anion gap: 10 (ref 5–15)
BUN: 31 mg/dL — ABNORMAL HIGH (ref 8–23)
CO2: 22 mmol/L (ref 22–32)
Calcium: 9.2 mg/dL (ref 8.9–10.3)
Chloride: 108 mmol/L (ref 98–111)
Creatinine, Ser: 1.4 mg/dL — ABNORMAL HIGH (ref 0.61–1.24)
GFR calc Af Amer: 57 mL/min — ABNORMAL LOW (ref >60)
GFR calc non Af Amer: 49 mL/min — ABNORMAL LOW (ref >60)
Glucose, Bld: 99 mg/dL (ref 70–99)
Potassium: 4.8 mmol/L (ref 3.5–5.1)
Sodium: 140 mmol/L (ref 135–145)
Total Bilirubin: 1.3 mg/dL — ABNORMAL HIGH (ref 0.3–1.2)
Total Protein: 6.6 g/dL (ref 6.5–8.1)

## 2019-01-20 LAB — URIC ACID: Uric Acid, Serum: 5.3 mg/dL (ref 3.7–8.6)

## 2019-01-20 LAB — PHOSPHORUS: Phosphorus: 3 mg/dL (ref 2.5–4.6)

## 2019-01-20 LAB — MAGNESIUM: Magnesium: 2.1 mg/dL (ref 1.7–2.4)

## 2019-01-20 MED ORDER — PROCHLORPERAZINE MALEATE 10 MG PO TABS: 10.0000 mg | ORAL_TABLET | Freq: Four times a day (QID) | ORAL | 1 refills | Status: DC | PRN

## 2019-01-20 MED ORDER — SODIUM CHLORIDE 0.9 % IV SOLN
20.0000 mg/m2 | Freq: Once | INTRAVENOUS | Status: AC
  Administered 2019-01-20: 45 mg via INTRAVENOUS
  Filled 2019-01-20: qty 9

## 2019-01-20 MED ORDER — HEPARIN SOD (PORK) LOCK FLUSH 100 UNIT/ML IV SOLN
500.0000 [IU] | Freq: Once | INTRAVENOUS | Status: AC | PRN
  Administered 2019-01-20: 500 [IU]

## 2019-01-20 MED ORDER — SODIUM CHLORIDE 0.9 % IV SOLN
Freq: Once | INTRAVENOUS | Status: AC
  Administered 2019-01-20: 10:00:00 via INTRAVENOUS

## 2019-01-20 MED ORDER — SODIUM CHLORIDE 0.9% FLUSH
10.0000 mL | INTRAVENOUS | Status: DC | PRN
  Administered 2019-01-20: 10 mL
  Filled 2019-01-20: qty 10

## 2019-01-20 MED ORDER — SODIUM CHLORIDE 0.9 % IV SOLN
8.0000 mg | Freq: Once | INTRAVENOUS | Status: AC
  Administered 2019-01-20: 8 mg via INTRAVENOUS
  Filled 2019-01-20: qty 4

## 2019-01-20 NOTE — Patient Instructions (Addendum)
[place] at [facility] Discharge Instructions  You were seen today by Dr. Delton Coombes. He went over your recent lab results. You will get treatment today. He will recheck your labs on Thursday. He will see you back in 1 week for labs and follow up.   Thank you for choosing [place] at [facility] to provide your oncology and hematology care.  To afford each patient quality time with our provider, please arrive at least 15 minutes before your scheduled appointment time.   If you have a lab appointment with the [facility] please come in thru the  Main Entrance and check in at the main information desk  You need to re-schedule your appointment should you arrive 10 or more minutes late.  We strive to give you quality time with our providers, and arriving late affects you and other patients whose appointments are after yours.  Also, if you no show three or more times for appointments you may be dismissed from the clinic at the providers discretion.     Again, thank you for choosing [facility].  Our hope is that these requests will decrease the amount of time that you wait before being seen by our physicians.       _____________________________________________________________  Should you have questions after your visit to [facility], please contact our office at [phone] between the hours of 8:00 a.m. and 4:30 p.m.  Voicemails left after 4:00 p.m. will not be returned until the following business day.  For prescription refill requests, have your pharmacy contact our office and allow 72 hours.    Cancer Center Support Programs:   > Cancer Support Group  2nd Tuesday of the month 1pm-2pm, Journey Room

## 2019-01-20 NOTE — Progress Notes (Signed)
[facility] [address] [place] [address]   CLINIC:  Medical Oncology/Hematology  PCP:  [name], MD [place] [id number] [phone]   REASON FOR VISIT:  Follow-up for newly diagnosed AML.  CURRENT THERAPY: Decitabine and venetoclax  BRIEF ONCOLOGIC HISTORY:  Oncology History  MDS (myelodysplastic syndrome), high grade (HCC)  08/14/2018 Initial Diagnosis   MDS (myelodysplastic syndrome), high grade ([facility])   08/22/2018 - 12/31/2018 Chemotherapy   The patient had palonosetron (ALOXI) injection 0.25 mg, 0.25 mg, Intravenous,  Once, 4 of 6 cycles Administration: 0.25 mg (08/22/2018), 0.25 mg (08/26/2018), 0.25 mg (08/28/2018), 0.25 mg (08/30/2018), 0.25 mg (10/01/2018), 0.25 mg (10/02/2018), 0.25 mg (11/04/2018), 0.25 mg (09/23/2018), 0.25 mg (09/25/2018), 0.25 mg (09/27/2018), 0.25 mg (10/28/2018), 0.25 mg (10/30/2018), 0.25 mg (11/01/2018), 0.25 mg (12/02/2018), 0.25 mg (12/04/2018), 0.25 mg (12/06/2018) azaCITIDine (VIDAZA) 100 mg in sodium chloride 0.9 % 50 mL chemo infusion, 110 mg (66.7 % of original dose 75 mg/m2), Intravenous, Once, 4 of 6 cycles Dose modification: 50 mg/m2 (original dose 75 mg/m2, Cycle 1, Reason: Provider Judgment), 50 mg/m2 (original dose 75 mg/m2, Cycle 2, Reason: Provider Judgment) Administration: 100 mg (08/22/2018), 100 mg (08/23/2018), 100 mg (08/26/2018), 100 mg (08/27/2018), 100 mg (08/28/2018), 100 mg (08/29/2018), 100 mg (08/30/2018), 100 mg (10/01/2018), 100 mg (10/02/2018), 100 mg (11/04/2018), 100 mg (11/05/2018), 100 mg (09/23/2018), 100 mg (09/24/2018), 100 mg (09/25/2018), 100 mg (09/26/2018), 100 mg (09/27/2018), 100 mg (10/28/2018), 100 mg (10/29/2018), 100 mg (10/30/2018), 100 mg (10/31/2018), 100 mg (11/01/2018), 100 mg (12/02/2018), 100 mg (12/03/2018), 100 mg (12/04/2018), 100 mg (12/05/2018), 100 mg (12/06/2018)  for chemotherapy treatment.    01/20/2019 -  Chemotherapy   The patient had decitabine (DACOGEN) 45 mg in sodium chloride 0.9 %  50 mL chemo infusion, 20 mg/m2 = 45 mg, Intravenous,  Once, 1 of 4 cycles Administration: 45 mg (01/20/2019) ondansetron (ZOFRAN) 8 mg in sodium chloride 0.9 % 50 mL IVPB, 8 mg (100 % of original dose 8 mg), Intravenous,  Once, 1 of 4 cycles Dose modification: 8 mg (original dose 8 mg, Cycle 1) Administration: 8 mg (01/20/2019)  for chemotherapy treatment.    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       CANCER STAGING: Cancer Staging No matching staging information was found for the patient.   INTERVAL HISTORY:  [name] 73 y.o. male seen for follow-up of acute myeloid leukemia.  He is able to start treatment today.  Reported that he received some injections into his knees and ankle by his orthopedic specialist.  Pains have improved.  He rated pain as 2 out of 10 in the knees and back.  Appetite is 100%.  Energy levels are low.  Denies any fevers or infections.  No nausea or vomiting or diarrhea reported.  REVIEW OF SYSTEMS:  Review of Systems  Constitutional: Positive for fatigue.  Neurological: Positive for dizziness.  Psychiatric/Behavioral: The patient  is nervous/anxious.   All other systems reviewed and are negative.    PAST MEDICAL/SURGICAL HISTORY:  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   Cancer ([facility]) 01-28-2014   skin cancer   CKD (chronic kidney disease), stage III (HCC)     GERD (gastroesophageal reflux disease)    Heart murmur    NOTED DURING PHYSICAL WHEN HE WAS ENLISTING IN [facility] , DIDNT KNOW UNTIL THAT TIME AND REPORTS , "THATS THE LAST I HEARD ABOUT IT "    History of hypertension    no longer issue   History of kidney stones    History of malignant melanoma of skin    excision top of scalp 2015-- no recurrence   History of urinary retention    post op lumbar fusion surgery 04/ 2016   Hypertension    Kidney dysfunction    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, MONITORED BY [facility] DR Annie Main DAHLSTEDT    Left ureteral calculus    Seasonal allergies    Wears glasses    Wears glasses    Wears partial dentures    upper and lower   Past Surgical History:  Procedure Laterality Date   ANKLE FUSION Right 2007   CARPAL TUNNEL RELEASE Left 12/28/2009   w/ pulley release left long finger   CARPAL TUNNEL RELEASE Right 07/22/2013   Procedure: RIGHT CARPAL TUNNEL RELEASE;  Surgeon: Cammie Sickle., MD;  Location: [facility];  Service: Orthopedics;  Laterality: Right;   COLONOSCOPY     CYSTO/  LEFT RETROGRADE PYELOGRAM  11/21/2010   CYSTOSCOPY WITH STENT PLACEMENT Left 03/09/2016   Procedure: CYSTOSCOPY WITH STENT PLACEMENT;  Surgeon: Franchot Gallo, MD;  Location: [facility];  Service: Urology;  Laterality: Left;   CYSTOSCOPY/RETROGRADE/URETEROSCOPY/STONE EXTRACTION WITH BASKET Left 03/09/2016   Procedure: CYSTOSCOPY/RETROGRADE/URETEROSCOPY/STONE EXTRACTION WITH BASKET;  Surgeon: Franchot Gallo, MD;  Location: [facility];  Service: Urology;  Laterality: Left;   LEFT URETEROSCOPIC LASER LITHOTRIPSY STONE EXTRACTION/ STENT PLACEMENT  05/23/2010   MOHS SURGERY     TOP OF THE HEAD    ORIF ANKLE FRACTURE Right 1978   PORTACATH PLACEMENT Right 08/19/2018   Procedure: INSERTION PORT-A-CATH (attached catheter in right subclavian);  Surgeon: Aviva Signs, MD;  Location: [facility];   Service: General;  Laterality: Right;   POSTERIOR LUMBAR FUSION  08/21/2014   laminectomy and decompression L2 -- L5   RIGHT LOWER LEG SURGERY  X3  1975 to 1976   including ORIF   TONSILLECTOMY AND ADENOIDECTOMY  3664   UMBILICAL HERNIA REPAIR  2009 approx     SOCIAL HISTORY:  Social History   Socioeconomic History   Marital status: Married    Spouse name: Not on file   Number of children: Not on file   Years of education: Not on file   Highest education level: Not on file  Occupational History   Not on file  Social Needs   Financial resource strain: Not on file   Food insecurity    Worry: Not on file    Inability: Not on file   Transportation needs    Medical: Not on file    Non-medical: Not on file  Tobacco Use   Smoking status: Former Smoker    Years: 20.00    Types: Cigarettes    Quit date: 07/17/1986    Years since quitting: 32.5   Smokeless tobacco:  Never Used  Substance and Sexual Activity   Alcohol use: Yes    Alcohol/week: 7.0 - 14.0 standard drinks    Types: 7 - 14 Cans of beer per week    Comment: 1 -2 beer daily   Drug use: No   Sexual activity: Not on file  Lifestyle   Physical activity    Days per week: Not on file    Minutes per session: Not on file   Stress: Not on file  Relationships   Social connections    Talks on phone: Not on file    Gets together: Not on file    Attends religious service: Not on file    Active member of club or organization: Not on file    Attends meetings of clubs or organizations: Not on file    Relationship status: Not on file   Intimate partner violence    Fear of current or ex partner: Not on file    Emotionally abused: Not on file    Physically abused: Not on file    Forced sexual activity: Not on file  Other Topics Concern   Not on file  Social History Narrative   Not on file    FAMILY HISTORY:  Family History  Problem Relation Age of Onset   Stroke Mother    Prostate cancer  Father    Bone cancer Father    Rheum arthritis Sister    Urinary tract infection Sister     CURRENT MEDICATIONS:  Outpatient Encounter Medications as of 01/20/2019  Medication Sig   acetaminophen (TYLENOL) 500 MG tablet Take 1,000 mg by mouth every 6 (six) hours as needed for mild pain or moderate pain.    allopurinol (ZYLOPRIM) 300 MG tablet Take 300 mg by mouth every evening.   celecoxib (CELEBREX) 200 MG capsule Take 200 mg by mouth daily. IN THE MORNING   cetirizine (ZYRTEC) 10 MG tablet Take 10 mg by mouth daily. IN THE MORNING   diclofenac sodium (VOLTAREN) 1 % GEL Apply 1 g topically 3 (three) times daily as needed (knee pain.).    docusate sodium (COLACE) 100 MG capsule Take 100 mg by mouth at bedtime.    fluticasone (FLONASE) 50 MCG/ACT nasal spray Place 1 spray into both nostrils daily. In the morning   lansoprazole (PREVACID) 15 MG capsule Take 15 mg by mouth daily before breakfast.    magnesium oxide (MAG-OX) 400 MG tablet Take 400 mg by mouth at bedtime.    Polyethyl Glycol-Propyl Glycol (SYSTANE OP) Place 1 drop into both eyes 2 (two) times daily.   tamsulosin (FLOMAX) 0.4 MG CAPS capsule Take 0.4 mg by mouth every evening.    traMADol (ULTRAM) 50 MG tablet Take 1 tablet (50 mg total) by mouth 3 (three) times daily as needed (pain.).   venetoclax 100 MG TABS Take 400 mg by mouth daily.   [DISCONTINUED] azaCITIDine 5 mg/2 mLs in lactated ringers infusion Inject into the vein daily. Day 1-7 every 28 days (starting 08/22/2018)   [DISCONTINUED] prochlorperazine (COMPAZINE) 10 MG tablet Take 1 tablet (10 mg total) by mouth every 6 (six) hours as needed (Nausea or vomiting).   No facility-administered encounter medications on file as of 01/20/2019.     ALLERGIES:  No Known Allergies   PHYSICAL EXAM:  ECOG Performance status: 1  Vitals:   01/20/19 0831  BP: 124/69  Pulse: 91  Resp: 14  Temp: (!) 96.9 F (36.1 C)  SpO2: 99%   Filed Weights  01/20/19  0831  Weight: 226 lb 6.4 oz (102.7 kg)    Physical Exam Constitutional:      Appearance: Normal appearance.  HENT:     Mouth/Throat:     Mouth: Mucous membranes are moist.     Pharynx: Oropharynx is clear.  Eyes:     Extraocular Movements: Extraocular movements intact.     Conjunctiva/sclera: Conjunctivae normal.  Neck:     Musculoskeletal: Normal range of motion.  Cardiovascular:     Rate and Rhythm: Normal rate and regular rhythm.     Heart sounds: Normal heart sounds.  Pulmonary:     Effort: Pulmonary effort is normal.     Breath sounds: Normal breath sounds.  Abdominal:     General: There is no distension.     Palpations: Abdomen is soft. There is no mass.  Musculoskeletal: Normal range of motion.        General: No deformity.  Skin:    General: Skin is warm.  Neurological:     General: No focal deficit present.     Mental Status: He is alert and oriented to person, place, and time.  Psychiatric:        Mood and Affect: Mood normal.        Behavior: Behavior normal.      LABORATORY DATA:  I have reviewed the labs as listed.  CBC    Component Value Date/Time   WBC 2.5 (L) 01/20/2019 0808   RBC 2.74 (L) 01/20/2019 0808   HGB 8.4 (L) 01/20/2019 0808   HCT 29.3 (L) 01/20/2019 0808   PLT 59 (L) 01/20/2019 0808   MCV 106.9 (H) 01/20/2019 0808   MCH 30.7 01/20/2019 0808   MCHC 28.7 (L) 01/20/2019 0808   RDW 18.6 (H) 01/20/2019 0808   LYMPHSABS 1.2 01/20/2019 0808   MONOABS 0.3 01/20/2019 0808   EOSABS 0.0 01/20/2019 0808   BASOSABS 0.0 01/20/2019 0808   CMP Latest Ref Rng & Units 01/20/2019 01/07/2019 12/02/2018  Glucose 70 - 99 mg/dL 99 119(H) 101(H)  BUN 8 - 23 mg/dL 31(H) 24(H) 32(H)  Creatinine 0.61 - 1.24 mg/dL 1.40(H) 1.36(H) 1.32(H)  Sodium 135 - 145 mmol/L 140 141 137  Potassium 3.5 - 5.1 mmol/L 4.8 4.9 4.7  Chloride 98 - 111 mmol/L 108 107 107  CO2 22 - 32 mmol/L _0 Calcium 8.9 - 10.3 mg/dL 9.2 9.5 9.1  Total Protein 6.5 - 8.1 g/dL 6.6 6.6  6.5  Total Bilirubin 0.3 - 1.2 mg/dL 1.3(H) 1.3(H) 1.3(H)  Alkaline Phos 38 - 126 U/L 55 51 47  AST 15 - 41 U/L _1 ALT 0 - 44 U/L _2 ASSESSMENT & PLAN:   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HCC) 1.  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  Flow cytometry-12% blasts.  Chromosome analysis and FISH panel normal. -4 cycles of azacitidine 50 mg per metered square from 08/22/2018 through 12/02/2018. -Repeat bone marrow biopsy on 12/23/2018 at [facility] showed 25% blasts and 90% cellular bone marrow, consistent with AML with MDS related changes.  Flow cytometry showed 11% blasts.  MDS FISH panel was normal. -NGS panel did not show any targetable mutations. - We discussed the treatment plan with decitabine 20 mg per metered squared daily for 5 days along with venetoclax.  We discussed side effects in detail. -He will start venetoclax 100 mg today and increase it to 200 mg tomorrow  and 300 mg on Wednesday and stay at that dose.  If he is doing well I will increase it to 400 mg next week.  We will check his labs this Thursday. -We will continue weekly monitoring of his labs to see if he needs any transfusions.  I will see him back in 1 week for follow-up.  2.  CKD: - Creatinine stable between 1.3-1.4.  3.  Back pain and knee pains: -He is taking tramadol 3 times a day along with Tylenol. -He had injections into his both knees and right ankle last week.   Total time spent is 40 minutes with more than 50% of the time spent face-to-face discussing treatment plan, counseling and coordination of care.  Orders placed this encounter:  Orders Placed This Encounter  Procedures   Magnesium   Phosphorus   Uric acid   CBC with Differential/Platelet   Comprehensive metabolic panel   Sample to Blood Bank      [name], [place] [phone]

## 2019-01-20 NOTE — Progress Notes (Signed)
START OFF PATHWAY REGIMEN - Other   [id number]:Decitabine:   A cycle is every 28 days:     Decitabine   **Always confirm dose/schedule in your pharmacy ordering system**  Patient Characteristics: Intent of Therapy: Non-Curative / Palliative Intent, Discussed with Patient 

## 2019-01-20 NOTE — Assessment & Plan Note (Addendum)
1.  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  Flow cytometry-12% blasts.  Chromosome analysis and FISH panel normal. -4 cycles of azacitidine 50 mg per metered square from 08/22/2018 through 12/02/2018. -Repeat bone marrow biopsy on 12/23/2018 at [facility] showed 25% blasts and 90% cellular bone marrow, consistent with AML with MDS related changes.  Flow cytometry showed 11% blasts.  MDS FISH panel was normal. -NGS panel did not show any targetable mutations. - We discussed the treatment plan with decitabine 20 mg per metered squared daily for 5 days along with venetoclax.  We discussed side effects in detail. -He will start venetoclax 100 mg today and increase it to 200 mg tomorrow and 300 mg on Wednesday and stay at that dose.  If he is doing well I will increase it to 400 mg next week.  We will check his labs this Thursday. -We will continue weekly monitoring of his labs to see if he needs any transfusions.  I will see him back in 1 week for follow-up.  2.  CKD: - Creatinine stable between 1.3-1.4.  3.  Back pain and knee pains: -He is taking tramadol 3 times a day along with Tylenol. -He had injections into his both knees and right ankle last week.

## 2019-01-20 NOTE — Progress Notes (Signed)
Labs reviewed with MD today at office visit. Proceed today with day 1. BUN and Creatinine noted by MD.   Treatment given per orders. Patient tolerated it well without problems. Vitals stable and discharged home from clinic ambulatory. Follow up as scheduled.

## 2019-01-20 NOTE — Progress Notes (Addendum)
ALERT: A disease instance has been permanently removed from this patient's pathway record and replaced with a new disease instance. Information on the new disease instance will be transmitted in a separate message.  Disease Being Removed: MDS  Reason for Removal: Previous diagnosis has morphed into a different diagnosis 

## 2019-01-21 ENCOUNTER — Ambulatory Visit (HOSPITAL_COMMUNITY): Payer: Medicare Other

## 2019-01-21 ENCOUNTER — Inpatient Hospital Stay (HOSPITAL_COMMUNITY): Payer: Medicare Other

## 2019-01-21 VITALS — BP 116/64 | HR 81 | Temp 96.8°F | Resp 18

## 2019-01-21 DIAGNOSIS — C9200 Acute myeloblastic leukemia, not having achieved remission: Secondary | ICD-10-CM | POA: Diagnosis not present

## 2019-01-21 DIAGNOSIS — Z95828 Presence of other vascular implants and grafts: Secondary | ICD-10-CM

## 2019-01-21 DIAGNOSIS — D46Z Other myelodysplastic syndromes: Secondary | ICD-10-CM

## 2019-01-21 MED ORDER — SODIUM CHLORIDE 0.9 % IV SOLN
Freq: Once | INTRAVENOUS | Status: AC
  Administered 2019-01-21: 09:00:00 via INTRAVENOUS

## 2019-01-21 MED ORDER — HEPARIN SOD (PORK) LOCK FLUSH 100 UNIT/ML IV SOLN
500.0000 [IU] | Freq: Once | INTRAVENOUS | Status: AC | PRN
  Administered 2019-01-21: 500 [IU]

## 2019-01-21 MED ORDER — SODIUM CHLORIDE 0.9 % IV SOLN
8.0000 mg | Freq: Once | INTRAVENOUS | Status: AC
  Administered 2019-01-21: 8 mg via INTRAVENOUS
  Filled 2019-01-21: qty 4

## 2019-01-21 MED ORDER — SODIUM CHLORIDE 0.9 % IV SOLN
20.0000 mg/m2 | Freq: Once | INTRAVENOUS | Status: AC
  Administered 2019-01-21: 45 mg via INTRAVENOUS
  Filled 2019-01-21: qty 9

## 2019-01-21 MED ORDER — SODIUM CHLORIDE 0.9% FLUSH
10.0000 mL | INTRAVENOUS | Status: DC | PRN
  Administered 2019-01-21: 10 mL
  Filled 2019-01-21: qty 10

## 2019-01-21 NOTE — Patient Instructions (Signed)
[facility] °Discharge Instructions for Patients Receiving Chemotherapy ° °Today you received the following chemotherapy agents  ° °To help prevent nausea and vomiting after your treatment, we encourage you to take your nausea medication  °  °If you develop nausea and vomiting that is not controlled by your nausea medication, call the clinic.  ° °BELOW ARE SYMPTOMS THAT SHOULD BE REPORTED IMMEDIATELY: °· *FEVER GREATER THAN 100.5 F °· *CHILLS WITH OR WITHOUT FEVER °· NAUSEA AND VOMITING THAT IS NOT CONTROLLED WITH YOUR NAUSEA MEDICATION °· *UNUSUAL SHORTNESS OF BREATH °· *UNUSUAL BRUISING OR BLEEDING °· TENDERNESS IN MOUTH AND THROAT WITH OR WITHOUT PRESENCE OF ULCERS °· *URINARY PROBLEMS °· *BOWEL PROBLEMS °· UNUSUAL RASH °Items with * indicate a potential emergency and should be followed up as soon as possible. ° °Feel free to call the clinic should you have any questions or concerns. The clinic phone number is [phone]. ° °Please show the CHEMO ALERT CARD at check-in to the Emergency Department and triage nurse. ° ° °

## 2019-01-21 NOTE — Progress Notes (Signed)
Treatment given per orders. Patient tolerated it well without problems. Vitals stable and discharged home from clinic ambulatory. Follow up as scheduled.  

## 2019-01-22 ENCOUNTER — Other Ambulatory Visit: Payer: Self-pay

## 2019-01-22 ENCOUNTER — Ambulatory Visit (HOSPITAL_COMMUNITY): Payer: Medicare Other

## 2019-01-22 ENCOUNTER — Inpatient Hospital Stay (HOSPITAL_COMMUNITY): Payer: Medicare Other

## 2019-01-22 VITALS — BP 123/64 | HR 75 | Temp 96.9°F | Resp 16

## 2019-01-22 DIAGNOSIS — Z95828 Presence of other vascular implants and grafts: Secondary | ICD-10-CM

## 2019-01-22 DIAGNOSIS — C9200 Acute myeloblastic leukemia, not having achieved remission: Secondary | ICD-10-CM | POA: Diagnosis not present

## 2019-01-22 DIAGNOSIS — D46Z Other myelodysplastic syndromes: Secondary | ICD-10-CM

## 2019-01-22 MED ORDER — SODIUM CHLORIDE 0.9% FLUSH
10.0000 mL | INTRAVENOUS | Status: DC | PRN
  Administered 2019-01-22: 10 mL
  Filled 2019-01-22: qty 10

## 2019-01-22 MED ORDER — SODIUM CHLORIDE 0.9 % IV SOLN
8.0000 mg | Freq: Once | INTRAVENOUS | Status: AC
  Administered 2019-01-22: 8 mg via INTRAVENOUS
  Filled 2019-01-22: qty 4

## 2019-01-22 MED ORDER — HEPARIN SOD (PORK) LOCK FLUSH 100 UNIT/ML IV SOLN
500.0000 [IU] | Freq: Once | INTRAVENOUS | Status: AC | PRN
  Administered 2019-01-22: 500 [IU]

## 2019-01-22 MED ORDER — SODIUM CHLORIDE 0.9 % IV SOLN
20.0000 mg/m2 | Freq: Once | INTRAVENOUS | Status: AC
  Administered 2019-01-22: 45 mg via INTRAVENOUS
  Filled 2019-01-22: qty 9

## 2019-01-22 MED ORDER — SODIUM CHLORIDE 0.9 % IV SOLN
Freq: Once | INTRAVENOUS | Status: AC
  Administered 2019-01-22: 08:00:00 via INTRAVENOUS

## 2019-01-22 NOTE — Progress Notes (Signed)
No new issues reported by patient today. Proceed with day 3 as planned.  Treatment given per orders. Patient tolerated it well without problems. Vitals stable and discharged home from clinic ambulatory. Follow up as scheduled.

## 2019-01-22 NOTE — Patient Instructions (Signed)
[facility] Discharge Instructions for Patients Receiving Chemotherapy  Today you received the following chemotherapy agents   To help prevent nausea and vomiting after your treatment, we encourage you to take your nausea medication   If you develop nausea and vomiting that is not controlled by your nausea medication, call the clinic.   BELOW ARE SYMPTOMS THAT SHOULD BE REPORTED IMMEDIATELY:  *FEVER GREATER THAN 100.5 F  *CHILLS WITH OR WITHOUT FEVER  NAUSEA AND VOMITING THAT IS NOT CONTROLLED WITH YOUR NAUSEA MEDICATION  *UNUSUAL SHORTNESS OF BREATH  *UNUSUAL BRUISING OR BLEEDING  TENDERNESS IN MOUTH AND THROAT WITH OR WITHOUT PRESENCE OF ULCERS  *URINARY PROBLEMS  *BOWEL PROBLEMS  UNUSUAL RASH Items with * indicate a potential emergency and should be followed up as soon as possible.  Feel free to call the clinic should you have any questions or concerns. The clinic phone number is [phone].  Please show the CHEMO ALERT CARD at check-in to the Emergency Department and triage nurse.   

## 2019-01-23 ENCOUNTER — Inpatient Hospital Stay (HOSPITAL_COMMUNITY): Payer: Medicare Other

## 2019-01-23 VITALS — BP 130/70 | HR 65 | Temp 97.1°F | Resp 18

## 2019-01-23 DIAGNOSIS — D46Z Other myelodysplastic syndromes: Secondary | ICD-10-CM

## 2019-01-23 DIAGNOSIS — C9200 Acute myeloblastic leukemia, not having achieved remission: Secondary | ICD-10-CM | POA: Diagnosis not present

## 2019-01-23 DIAGNOSIS — Z95828 Presence of other vascular implants and grafts: Secondary | ICD-10-CM

## 2019-01-23 LAB — CBC WITH DIFFERENTIAL/PLATELET
Abs Immature Granulocytes: 0 10*3/uL (ref 0.00–0.07)
Basophils Absolute: 0 10*3/uL (ref 0.0–0.1)
Basophils Relative: 0 %
Eosinophils Absolute: 0 10*3/uL (ref 0.0–0.5)
Eosinophils Relative: 0 %
HCT: 24.8 % — ABNORMAL LOW (ref 39.0–52.0)
Hemoglobin: 7.1 g/dL — ABNORMAL LOW (ref 13.0–17.0)
Immature Granulocytes: 0 %
Lymphocytes Relative: 51 %
Lymphs Abs: 0.5 10*3/uL — ABNORMAL LOW (ref 0.7–4.0)
MCH: 30.2 pg (ref 26.0–34.0)
MCHC: 28.6 g/dL — ABNORMAL LOW (ref 30.0–36.0)
MCV: 105.5 fL — ABNORMAL HIGH (ref 80.0–100.0)
Monocytes Absolute: 0 10*3/uL — ABNORMAL LOW (ref 0.1–1.0)
Monocytes Relative: 1 %
Neutro Abs: 0.5 10*3/uL — ABNORMAL LOW (ref 1.7–7.7)
Neutrophils Relative %: 48 %
Platelets: 25 10*3/uL — CL (ref 150–400)
RBC: 2.35 MIL/uL — ABNORMAL LOW (ref 4.22–5.81)
RDW: 18 % — ABNORMAL HIGH (ref 11.5–15.5)
WBC: 1 10*3/uL — CL (ref 4.0–10.5)
nRBC: 2 % — ABNORMAL HIGH (ref 0.0–0.2)

## 2019-01-23 LAB — COMPREHENSIVE METABOLIC PANEL
ALT: 23 U/L (ref 0–44)
AST: 21 U/L (ref 15–41)
Albumin: 3.4 g/dL — ABNORMAL LOW (ref 3.5–5.0)
Alkaline Phosphatase: 48 U/L (ref 38–126)
Anion gap: 6 (ref 5–15)
BUN: 28 mg/dL — ABNORMAL HIGH (ref 8–23)
CO2: 24 mmol/L (ref 22–32)
Calcium: 8.5 mg/dL — ABNORMAL LOW (ref 8.9–10.3)
Chloride: 108 mmol/L (ref 98–111)
Creatinine, Ser: 1.39 mg/dL — ABNORMAL HIGH (ref 0.61–1.24)
GFR calc Af Amer: 57 mL/min — ABNORMAL LOW (ref >60)
GFR calc non Af Amer: 50 mL/min — ABNORMAL LOW (ref >60)
Glucose, Bld: 120 mg/dL — ABNORMAL HIGH (ref 70–99)
Potassium: 4.3 mmol/L (ref 3.5–5.1)
Sodium: 138 mmol/L (ref 135–145)
Total Bilirubin: 1.5 mg/dL — ABNORMAL HIGH (ref 0.3–1.2)
Total Protein: 5.9 g/dL — ABNORMAL LOW (ref 6.5–8.1)

## 2019-01-23 LAB — PREPARE RBC (CROSSMATCH)

## 2019-01-23 MED ORDER — SODIUM CHLORIDE 0.9% IV SOLUTION
250.0000 mL | Freq: Once | INTRAVENOUS | Status: AC
  Administered 2019-01-23: 250 mL via INTRAVENOUS

## 2019-01-23 MED ORDER — SODIUM CHLORIDE 0.9 % IV SOLN
Freq: Once | INTRAVENOUS | Status: AC
  Administered 2019-01-23: 09:00:00 via INTRAVENOUS

## 2019-01-23 MED ORDER — ACETAMINOPHEN 325 MG PO TABS
650.0000 mg | ORAL_TABLET | Freq: Once | ORAL | Status: AC
  Administered 2019-01-23: 650 mg via ORAL
  Filled 2019-01-23: qty 2

## 2019-01-23 MED ORDER — SODIUM CHLORIDE 0.9 % IV SOLN
8.0000 mg | Freq: Once | INTRAVENOUS | Status: AC
  Administered 2019-01-23: 8 mg via INTRAVENOUS
  Filled 2019-01-23: qty 4

## 2019-01-23 MED ORDER — HEPARIN SOD (PORK) LOCK FLUSH 100 UNIT/ML IV SOLN
500.0000 [IU] | Freq: Once | INTRAVENOUS | Status: AC | PRN
  Administered 2019-01-23: 500 [IU]

## 2019-01-23 MED ORDER — DIPHENHYDRAMINE HCL 25 MG PO CAPS
25.0000 mg | ORAL_CAPSULE | Freq: Once | ORAL | Status: AC
  Administered 2019-01-23: 25 mg via ORAL
  Filled 2019-01-23: qty 1

## 2019-01-23 MED ORDER — SODIUM CHLORIDE 0.9% FLUSH
10.0000 mL | INTRAVENOUS | Status: DC | PRN
  Administered 2019-01-23 (×2): 10 mL
  Filled 2019-01-23 (×2): qty 10

## 2019-01-23 MED ORDER — SODIUM CHLORIDE 0.9 % IV SOLN
20.0000 mg/m2 | Freq: Once | INTRAVENOUS | Status: AC
  Administered 2019-01-23: 45 mg via INTRAVENOUS
  Filled 2019-01-23: qty 9

## 2019-01-23 NOTE — Progress Notes (Signed)
No new issues reported by patient today. Labs drawn per orders. Proceed per protocol.   Labs reviewed with MD today. Will give one unit of blood today per orders.   Treatment given per orders. Patient tolerated it well without problems. Vitals stable and discharged home from clinic ambulatory. Follow up as scheduled.

## 2019-01-23 NOTE — Patient Instructions (Signed)
[facility] Discharge Instructions for Patients Receiving Chemotherapy  Today you received the following chemotherapy agents   To help prevent nausea and vomiting after your treatment, we encourage you to take your nausea medication   If you develop nausea and vomiting that is not controlled by your nausea medication, call the clinic.   BELOW ARE SYMPTOMS THAT SHOULD BE REPORTED IMMEDIATELY:  *FEVER GREATER THAN 100.5 F  *CHILLS WITH OR WITHOUT FEVER  NAUSEA AND VOMITING THAT IS NOT CONTROLLED WITH YOUR NAUSEA MEDICATION  *UNUSUAL SHORTNESS OF BREATH  *UNUSUAL BRUISING OR BLEEDING  TENDERNESS IN MOUTH AND THROAT WITH OR WITHOUT PRESENCE OF ULCERS  *URINARY PROBLEMS  *BOWEL PROBLEMS  UNUSUAL RASH Items with * indicate a potential emergency and should be followed up as soon as possible.  Feel free to call the clinic should you have any questions or concerns. The clinic phone number is [phone].  Please show the CHEMO ALERT CARD at check-in to the Emergency Department and triage nurse.   

## 2019-01-23 NOTE — Progress Notes (Signed)
CRITICAL VALUE ALERT  Critical Value:  WBC 1.0; platelets 25  Date & Time Notied:  01/23/2019 at 0921  Provider Notified: Dr. Delton Coombes  Orders Received/Actions taken: n/a

## 2019-01-24 ENCOUNTER — Inpatient Hospital Stay (HOSPITAL_COMMUNITY): Payer: Medicare Other

## 2019-01-24 ENCOUNTER — Other Ambulatory Visit: Payer: Self-pay

## 2019-01-24 VITALS — BP 115/63 | HR 78 | Temp 96.8°F | Resp 18

## 2019-01-24 DIAGNOSIS — Z95828 Presence of other vascular implants and grafts: Secondary | ICD-10-CM

## 2019-01-24 DIAGNOSIS — C9200 Acute myeloblastic leukemia, not having achieved remission: Secondary | ICD-10-CM | POA: Diagnosis not present

## 2019-01-24 DIAGNOSIS — D46Z Other myelodysplastic syndromes: Secondary | ICD-10-CM

## 2019-01-24 LAB — BPAM RBC
Blood Product Expiration Date: 202010182359
ISSUE DATE / TIME: 202009171131
Unit Type and Rh: 5100

## 2019-01-24 LAB — TYPE AND SCREEN
ABO/RH(D): O POS
Antibody Screen: NEGATIVE
Unit division: 0

## 2019-01-24 MED ORDER — SODIUM CHLORIDE 0.9 % IV SOLN
20.0000 mg/m2 | Freq: Once | INTRAVENOUS | Status: AC
  Administered 2019-01-24: 45 mg via INTRAVENOUS
  Filled 2019-01-24: qty 9

## 2019-01-24 MED ORDER — SODIUM CHLORIDE 0.9 % IV SOLN
8.0000 mg | Freq: Once | INTRAVENOUS | Status: AC
  Administered 2019-01-24: 8 mg via INTRAVENOUS
  Filled 2019-01-24: qty 4

## 2019-01-24 MED ORDER — HEPARIN SOD (PORK) LOCK FLUSH 100 UNIT/ML IV SOLN
500.0000 [IU] | Freq: Once | INTRAVENOUS | Status: AC | PRN
  Administered 2019-01-24: 500 [IU]

## 2019-01-24 MED ORDER — SODIUM CHLORIDE 0.9 % IV SOLN
Freq: Once | INTRAVENOUS | Status: AC
  Administered 2019-01-24: 08:00:00 via INTRAVENOUS

## 2019-01-24 MED ORDER — SODIUM CHLORIDE 0.9% FLUSH
10.0000 mL | INTRAVENOUS | Status: DC | PRN
  Administered 2019-01-24: 10 mL
  Filled 2019-01-24: qty 10

## 2019-01-24 NOTE — Patient Instructions (Signed)
[facility] Discharge Instructions for Patients Receiving Chemotherapy  Today you received the following chemotherapy agents   To help prevent nausea and vomiting after your treatment, we encourage you to take your nausea medication   If you develop nausea and vomiting that is not controlled by your nausea medication, call the clinic.   BELOW ARE SYMPTOMS THAT SHOULD BE REPORTED IMMEDIATELY:  *FEVER GREATER THAN 100.5 F  *CHILLS WITH OR WITHOUT FEVER  NAUSEA AND VOMITING THAT IS NOT CONTROLLED WITH YOUR NAUSEA MEDICATION  *UNUSUAL SHORTNESS OF BREATH  *UNUSUAL BRUISING OR BLEEDING  TENDERNESS IN MOUTH AND THROAT WITH OR WITHOUT PRESENCE OF ULCERS  *URINARY PROBLEMS  *BOWEL PROBLEMS  UNUSUAL RASH Items with * indicate a potential emergency and should be followed up as soon as possible.  Feel free to call the clinic should you have any questions or concerns. The clinic phone number is [phone].  Please show the CHEMO ALERT CARD at check-in to the Emergency Department and triage nurse.   

## 2019-01-24 NOTE — Progress Notes (Signed)
Treatment given per orders. Patient tolerated it well without problems. Vitals stable and discharged home from clinic ambulatory. Follow up as scheduled.  

## 2019-01-27 ENCOUNTER — Other Ambulatory Visit: Payer: Self-pay

## 2019-01-27 ENCOUNTER — Inpatient Hospital Stay (HOSPITAL_COMMUNITY): Payer: Medicare Other

## 2019-01-27 ENCOUNTER — Encounter (HOSPITAL_COMMUNITY): Payer: Self-pay | Admitting: Hematology

## 2019-01-27 ENCOUNTER — Other Ambulatory Visit (HOSPITAL_COMMUNITY): Payer: Self-pay | Admitting: Nurse Practitioner

## 2019-01-27 ENCOUNTER — Inpatient Hospital Stay (HOSPITAL_BASED_OUTPATIENT_CLINIC_OR_DEPARTMENT_OTHER): Payer: Medicare Other | Admitting: Hematology

## 2019-01-27 VITALS — BP 131/41 | HR 97 | Temp 97.1°F | Resp 16 | Wt 229.7 lb

## 2019-01-27 DIAGNOSIS — D46Z Other myelodysplastic syndromes: Secondary | ICD-10-CM

## 2019-01-27 DIAGNOSIS — C9200 Acute myeloblastic leukemia, not having achieved remission: Secondary | ICD-10-CM | POA: Diagnosis not present

## 2019-01-27 DIAGNOSIS — M25559 Pain in unspecified hip: Secondary | ICD-10-CM

## 2019-01-27 LAB — CBC WITH DIFFERENTIAL/PLATELET
Abs Immature Granulocytes: 0 10*3/uL (ref 0.00–0.07)
Basophils Absolute: 0 10*3/uL (ref 0.0–0.1)
Basophils Relative: 0 %
Eosinophils Absolute: 0 10*3/uL (ref 0.0–0.5)
Eosinophils Relative: 0 %
HCT: 27.2 % — ABNORMAL LOW (ref 39.0–52.0)
Hemoglobin: 7.8 g/dL — ABNORMAL LOW (ref 13.0–17.0)
Immature Granulocytes: 0 %
Lymphocytes Relative: 59 %
Lymphs Abs: 0.4 10*3/uL — ABNORMAL LOW (ref 0.7–4.0)
MCH: 29.7 pg (ref 26.0–34.0)
MCHC: 28.7 g/dL — ABNORMAL LOW (ref 30.0–36.0)
MCV: 103.4 fL — ABNORMAL HIGH (ref 80.0–100.0)
Monocytes Absolute: 0 10*3/uL — ABNORMAL LOW (ref 0.1–1.0)
Monocytes Relative: 1 %
Neutro Abs: 0.3 10*3/uL — ABNORMAL LOW (ref 1.7–7.7)
Neutrophils Relative %: 40 %
Platelets: 19 10*3/uL — CL (ref 150–400)
RBC: 2.63 MIL/uL — ABNORMAL LOW (ref 4.22–5.81)
RDW: 18 % — ABNORMAL HIGH (ref 11.5–15.5)
WBC: 0.8 10*3/uL — CL (ref 4.0–10.5)
nRBC: 2.7 % — ABNORMAL HIGH (ref 0.0–0.2)

## 2019-01-27 LAB — COMPREHENSIVE METABOLIC PANEL
ALT: 22 U/L (ref 0–44)
AST: 21 U/L (ref 15–41)
Albumin: 3.5 g/dL (ref 3.5–5.0)
Alkaline Phosphatase: 51 U/L (ref 38–126)
Anion gap: 10 (ref 5–15)
BUN: 30 mg/dL — ABNORMAL HIGH (ref 8–23)
CO2: 23 mmol/L (ref 22–32)
Calcium: 9 mg/dL (ref 8.9–10.3)
Chloride: 108 mmol/L (ref 98–111)
Creatinine, Ser: 1.39 mg/dL — ABNORMAL HIGH (ref 0.61–1.24)
GFR calc Af Amer: 57 mL/min — ABNORMAL LOW (ref >60)
GFR calc non Af Amer: 50 mL/min — ABNORMAL LOW (ref >60)
Glucose, Bld: 131 mg/dL — ABNORMAL HIGH (ref 70–99)
Potassium: 4.9 mmol/L (ref 3.5–5.1)
Sodium: 141 mmol/L (ref 135–145)
Total Bilirubin: 1.5 mg/dL — ABNORMAL HIGH (ref 0.3–1.2)
Total Protein: 6.1 g/dL — ABNORMAL LOW (ref 6.5–8.1)

## 2019-01-27 LAB — URIC ACID: Uric Acid, Serum: 6.1 mg/dL (ref 3.7–8.6)

## 2019-01-27 LAB — PHOSPHORUS: Phosphorus: 2.2 mg/dL — ABNORMAL LOW (ref 2.5–4.6)

## 2019-01-27 LAB — MAGNESIUM: Magnesium: 1.8 mg/dL (ref 1.7–2.4)

## 2019-01-27 NOTE — Progress Notes (Signed)
CRITICAL VALUE ALERT Critical value received:  WBC 0.8 and Platelets 19 Date of notification:  01/27/2019  Time of notification: [id number] Critical value read back:  Yes.   Nurse who received alert:  L. Lynnae Sandhoff, RN MD notified time and response:  [address] 1236, no new orders at this time

## 2019-01-27 NOTE — Progress Notes (Signed)
[facility] [address] [place] [address]   CLINIC:  Medical Oncology/Hematology  PCP:  [name], MD [place] [id number] [phone]   REASON FOR VISIT:  Follow-up for newly diagnosed AML.  CURRENT THERAPY: Decitabine and venetoclax  BRIEF ONCOLOGIC HISTORY:  Oncology History  MDS (myelodysplastic syndrome), high grade (HCC)  08/14/2018 Initial Diagnosis   MDS (myelodysplastic syndrome), high grade ([facility])   08/22/2018 - 12/31/2018 Chemotherapy   The patient had palonosetron (ALOXI) injection 0.25 mg, 0.25 mg, Intravenous,  Once, 4 of 6 cycles Administration: 0.25 mg (08/22/2018), 0.25 mg (08/26/2018), 0.25 mg (08/28/2018), 0.25 mg (08/30/2018), 0.25 mg (10/01/2018), 0.25 mg (10/02/2018), 0.25 mg (11/04/2018), 0.25 mg (09/23/2018), 0.25 mg (09/25/2018), 0.25 mg (09/27/2018), 0.25 mg (10/28/2018), 0.25 mg (10/30/2018), 0.25 mg (11/01/2018), 0.25 mg (12/02/2018), 0.25 mg (12/04/2018), 0.25 mg (12/06/2018) azaCITIDine (VIDAZA) 100 mg in sodium chloride 0.9 % 50 mL chemo infusion, 110 mg (66.7 % of original dose 75 mg/m2), Intravenous, Once, 4 of 6 cycles Dose modification: 50 mg/m2 (original dose 75 mg/m2, Cycle 1, Reason: Provider Judgment), 50 mg/m2 (original dose 75 mg/m2, Cycle 2, Reason: Provider Judgment) Administration: 100 mg (08/22/2018), 100 mg (08/23/2018), 100 mg (08/26/2018), 100 mg (08/27/2018), 100 mg (08/28/2018), 100 mg (08/29/2018), 100 mg (08/30/2018), 100 mg (10/01/2018), 100 mg (10/02/2018), 100 mg (11/04/2018), 100 mg (11/05/2018), 100 mg (09/23/2018), 100 mg (09/24/2018), 100 mg (09/25/2018), 100 mg (09/26/2018), 100 mg (09/27/2018), 100 mg (10/28/2018), 100 mg (10/29/2018), 100 mg (10/30/2018), 100 mg (10/31/2018), 100 mg (11/01/2018), 100 mg (12/02/2018), 100 mg (12/03/2018), 100 mg (12/04/2018), 100 mg (12/05/2018), 100 mg (12/06/2018)  for chemotherapy treatment.    01/20/2019 -  Chemotherapy   The patient had decitabine (DACOGEN) 45 mg in sodium chloride 0.9 %  50 mL chemo infusion, 20 mg/m2 = 45 mg, Intravenous,  Once, 1 of 4 cycles Administration: 45 mg (01/20/2019), 45 mg (01/21/2019), 45 mg (01/22/2019), 45 mg (01/23/2019), 45 mg (01/24/2019) ondansetron (ZOFRAN) 8 mg in sodium chloride 0.9 % 50 mL IVPB, 8 mg (100 % of original dose 8 mg), Intravenous,  Once, 1 of 4 cycles Dose modification: 8 mg (original dose 8 mg, Cycle 1) Administration: 8 mg (01/20/2019), 8 mg (01/21/2019), 8 mg (01/22/2019), 8 mg (01/23/2019), 8 mg (01/24/2019)  for chemotherapy treatment.    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[facility])  01/07/2019 Initial Diagnosis   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[facility])   01/20/2019 -  Chemotherapy   The patient had decitabine (DACOGEN) 45 mg in sodium chloride 0.9 % 50 mL chemo infusion, 20 mg/m2 = 45 mg, Intravenous,  Once, 1 of 4 cycles Administration: 45 mg (01/20/2019), 45 mg (01/21/2019), 45 mg (01/22/2019), 45 mg (01/23/2019), 45 mg (01/24/2019) ondansetron (ZOFRAN) 8 mg in sodium chloride 0.9 % 50 mL IVPB, 8 mg (100 % of original dose 8 mg), Intravenous,  Once, 1 of 4 cycles Dose modification: 8 mg (original dose 8 mg, Cycle 1) Administration: 8 mg (01/20/2019), 8 mg (01/21/2019), 8 mg (01/22/2019), 8 mg (01/23/2019), 8 mg (01/24/2019)  for chemotherapy treatment.       CANCER STAGING: Cancer Staging No matching staging information was found for the patient.   INTERVAL HISTORY:  Richard Wallace 73 y.o. male seen for follow-up of AML.  He started his first cycle of chemotherapy with decitabine on 01/20/2019.  He also started venetoclax ramp-up dose and is currently on 300 mg daily.  Appetite is 100%.  Energy levels are low.  Pain in the knees, back  and right ankle have been stable.  He had episodes of mild nausea which was controlled with Compazine.  Shortness of breath with activity was reported.  No spontaneous bleeding or bruising.  No nosebleeds, hematuria or gum bleeds.  REVIEW OF SYSTEMS:  Review of Systems  Constitutional: Positive for fatigue.   Gastrointestinal: Positive for nausea.  Neurological: Positive for dizziness.  All other systems reviewed and are negative.    PAST MEDICAL/SURGICAL HISTORY:  Past Medical History:  Diagnosis Date  . Arthritis   . 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 Cancer ([facility]) 01-28-2014   skin cancer  . CKD (chronic kidney disease), stage III ([facility])   . GERD (gastroesophageal reflux disease)   . Heart murmur    NOTED DURING PHYSICAL WHEN HE WAS ENLISTING IN [facility] , DIDNT KNOW UNTIL THAT TIME AND REPORTS , "THATS THE LAST I HEARD ABOUT IT "   . History of hypertension    no longer issue  . History of kidney stones   . History of malignant melanoma of skin    excision top of scalp 2015-- no recurrence  . History of urinary retention    post op lumbar fusion surgery 04/ 2016  . Hypertension   . 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 Left ureteral calculus   . Seasonal allergies   . Wears glasses   . Wears glasses   . Wears partial dentures    upper and lower   Past Surgical History:  Procedure Laterality Date  . ANKLE FUSION Right 2007  . CARPAL TUNNEL RELEASE Left 12/28/2009   w/ pulley release left long finger  . CARPAL TUNNEL RELEASE Right 07/22/2013   Procedure: RIGHT CARPAL TUNNEL RELEASE;  Surgeon: Cammie Sickle., MD;  Location: [facility];  Service: Orthopedics;  Laterality: Right;  . COLONOSCOPY    . CYSTO/  LEFT RETROGRADE PYELOGRAM  11/21/2010  . CYSTOSCOPY WITH STENT PLACEMENT Left 03/09/2016   Procedure: CYSTOSCOPY WITH STENT PLACEMENT;  Surgeon: Franchot Gallo, MD;  [facility];  Service: Urology;  Laterality: Left;  . CYSTOSCOPY/RETROGRADE/URETEROSCOPY/STONE EXTRACTION WITH BASKET Left 03/09/2016   Procedure: CYSTOSCOPY/RETROGRADE/URETEROSCOPY/STONE EXTRACTION WITH BASKET;  Surgeon: Franchot Gallo, MD;  Location: [facility];   Service: Urology;  Laterality: Left;  . LEFT URETEROSCOPIC LASER LITHOTRIPSY STONE EXTRACTION/ STENT PLACEMENT  05/23/2010  . MOHS SURGERY     TOP OF THE HEAD   . ORIF ANKLE FRACTURE Right 1978  . PORTACATH PLACEMENT Right 08/19/2018   Procedure: INSERTION PORT-A-CATH (attached catheter in right subclavian);  Surgeon: Aviva Signs, MD;  Location: [facility];  Service: General;  Laterality: Right;  . POSTERIOR LUMBAR FUSION  08/21/2014   laminectomy and decompression L2 -- L5  . RIGHT LOWER LEG SURGERY  X3  1975 to 1976   including ORIF  . TONSILLECTOMY AND ADENOIDECTOMY  1986  . UMBILICAL HERNIA REPAIR  2009 approx     SOCIAL HISTORY:  Social History   Socioeconomic History  . Marital status: Married    Spouse name: Not on file  . Number of children: Not on file  . Years of education: Not on file  . Highest education level: Not on file  Occupational History  . Not on file  Social Needs  . Financial resource strain: Not on file  . Food insecurity    Worry: Not on file  Inability: Not on file  . Transportation needs    Medical: Not on file    Non-medical: Not on file  Tobacco Use  . Smoking status: Former Smoker    Years: 20.00    Types: Cigarettes    Quit date: 07/17/1986    Years since quitting: 32.5  . Smokeless tobacco: Never Used  Substance and Sexual Activity  . Alcohol use: Yes    Alcohol/week: 7.0 - 14.0 standard drinks    Types: 7 - 14 Cans of beer per week    Comment: 1 -2 beer daily  . Drug use: No  . Sexual activity: Not on file  Lifestyle  . Physical activity    Days per week: Not on file    Minutes per session: Not on file  . Stress: Not on file  Relationships  . Social Herbalist on phone: Not on file    Gets together: Not on file    Attends religious service: Not on file    Active member of club or organization: Not on file    Attends meetings of clubs or organizations: Not on file    Relationship status: Not on file  . Intimate  partner violence    Fear of current or ex partner: Not on file    Emotionally abused: Not on file    Physically abused: Not on file    Forced sexual activity: Not on file  Other Topics Concern  . Not on file  Social History Narrative  . Not on file    FAMILY HISTORY:  Family History  Problem Relation Age of Onset  . Stroke Mother   . Prostate cancer Father   . Bone cancer Father   . Rheum arthritis Sister   . Urinary tract infection Sister     CURRENT MEDICATIONS:  Outpatient Encounter Medications as of 01/27/2019  Medication Sig  . acetaminophen (TYLENOL) 500 MG tablet Take 1,000 mg by mouth every 6 (six) hours as needed for mild pain or moderate pain.   Marland Kitchen allopurinol (ZYLOPRIM) 300 MG tablet Take 300 mg by mouth every evening.  . celecoxib (CELEBREX) 200 MG capsule Take 200 mg by mouth daily. IN THE MORNING  . cetirizine (ZYRTEC) 10 MG tablet Take 10 mg by mouth daily. IN THE MORNING  . decitabine 20 mg/m2 in sodium chloride 0.9 % 50 mL Inject 20 mg/m2 into the vein every 28 (twenty-eight) days. Days 1-5 every 28 days  . diclofenac sodium (VOLTAREN) 1 % GEL Apply 1 g topically 3 (three) times daily as needed (knee pain.).   Marland Kitchen docusate sodium (COLACE) 100 MG capsule Take 100 mg by mouth at bedtime.   . fluticasone (FLONASE) 50 MCG/ACT nasal spray Place 1 spray into both nostrils daily. In the morning  . lansoprazole (PREVACID) 15 MG capsule Take 15 mg by mouth daily before breakfast.   . magnesium oxide (MAG-OX) 400 MG tablet Take 400 mg by mouth at bedtime.   Vladimir Faster Glycol-Propyl Glycol (SYSTANE OP) Place 1 drop into both eyes 2 (two) times daily.  . prochlorperazine (COMPAZINE) 10 MG tablet Take 1 tablet (10 mg total) by mouth every 6 (six) hours as needed for nausea or vomiting.  . tamsulosin (FLOMAX) 0.4 MG CAPS capsule Take 0.4 mg by mouth every evening.   . venetoclax 100 MG TABS Take 400 mg by mouth daily.  . [DISCONTINUED] traMADol (ULTRAM) 50 MG tablet Take 1  tablet (50 mg total) by mouth 3 (three) times  daily as needed (pain.).  . [EXPIRED] 0.9 %  sodium chloride infusion   . [EXPIRED] decitabine (DACOGEN) 45 mg in sodium chloride 0.9 % 50 mL chemo infusion   . [EXPIRED] heparin lock flush 100 unit/mL   . [EXPIRED] ondansetron (ZOFRAN) 8 mg in sodium chloride 0.9 % 50 mL IVPB   . [DISCONTINUED] sodium chloride flush (NS) 0.9 % injection 10 mL    No facility-administered encounter medications on file as of 01/27/2019.     ALLERGIES:  No Known Allergies   PHYSICAL EXAM:  ECOG Performance status: 1  Vitals:   01/27/19 1101  BP: (!) 131/41  Pulse: 97  Resp: 16  Temp: (!) 97.1 F (36.2 C)  SpO2: 99%   Filed Weights   01/27/19 1101  Weight: 229 lb 11.2 oz (104.2 kg)    Physical Exam Constitutional:      Appearance: Normal appearance.  HENT:     Mouth/Throat:     Mouth: Mucous membranes are moist.     Pharynx: Oropharynx is clear.  Eyes:     Extraocular Movements: Extraocular movements intact.     Conjunctiva/sclera: Conjunctivae normal.  Neck:     Musculoskeletal: Normal range of motion.  Cardiovascular:     Rate and Rhythm: Normal rate and regular rhythm.     Heart sounds: Normal heart sounds.  Pulmonary:     Effort: Pulmonary effort is normal.     Breath sounds: Normal breath sounds.  Abdominal:     General: There is no distension.     Palpations: Abdomen is soft. There is no mass.  Musculoskeletal: Normal range of motion.        General: No deformity.  Skin:    General: Skin is warm.  Neurological:     General: No focal deficit present.     Mental Status: He is alert and oriented to person, place, and time.  Psychiatric:        Mood and Affect: Mood normal.        Behavior: Behavior normal.      LABORATORY DATA:  I have reviewed the labs as listed.  CBC    Component Value Date/Time   WBC 0.8 (LL) 01/27/2019 1141   RBC 2.63 (L) 01/27/2019 1141   HGB 7.8 (L) 01/27/2019 1141   HCT 27.2 (L) 01/27/2019  1141   PLT 19 (LL) 01/27/2019 1141   MCV 103.4 (H) 01/27/2019 1141   MCH 29.7 01/27/2019 1141   MCHC 28.7 (L) 01/27/2019 1141   RDW 18.0 (H) 01/27/2019 1141   LYMPHSABS 0.4 (L) 01/27/2019 1141   MONOABS 0.0 (L) 01/27/2019 1141   EOSABS 0.0 01/27/2019 1141   BASOSABS 0.0 01/27/2019 1141   CMP Latest Ref Rng & Units 01/27/2019 01/23/2019 01/20/2019  Glucose 70 - 99 mg/dL 131(H) 120(H) 99  BUN 8 - 23 mg/dL 30(H) 28(H) 31(H)  Creatinine 0.61 - 1.24 mg/dL 1.39(H) 1.39(H) 1.40(H)  Sodium 135 - 145 mmol/L 141 138 140  Potassium 3.5 - 5.1 mmol/L 4.9 4.3 4.8  Chloride 98 - 111 mmol/L 108 108 108  CO2 22 - 32 mmol/L _0 Calcium 8.9 - 10.3 mg/dL 9.0 8.5(L) 9.2  Total Protein 6.5 - 8.1 g/dL 6.1(L) 5.9(L) 6.6  Total Bilirubin 0.3 - 1.2 mg/dL 1.5(H) 1.5(H) 1.3(H)  Alkaline Phos 38 - 126 U/L 51 48 55  AST 15 - 41 U/L _1 ALT 0 - 44 U/L _2 ASSESSMENT &  PLAN:   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[place]) 1.  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  Flow cytometry-12% blasts.  Chromosome analysis and FISH panel normal. -4 cycles of azacitidine 50 mg per metered square from 08/22/2018 through 12/02/2018. -Repeat bone marrow biopsy on 12/23/2018 at [facility] showed 25% blasts and 90% cellular bone marrow, consistent with AML with MDS related changes.  Flow cytometry showed 11% blasts.  MDS FISH panel was normal. -NGS panel did not show any targetable mutations. - Cycle 1 of decitabine 20 mg per metered square on 01/20/2019. - He is currently taking venetoclax 300 mg daily.  Denies any major GI side effects other than minor nausea which is controlled with Compazine. - CBC today shows hemoglobin 7.8, platelet count 19.  White count is 0.8. -he will receive 1 unit PRBC.  I will reevaluate him in 1 week with labs.  2.  CKD: - Creatinine stable between 1.3-1.4.  3.  Back pain and knee pains: -He is taking tramadol 3 times a day along with  Tylenol. -He had injections into his both knees and right ankle last week.   Total time spent is 25 minutes with more than 50% of the time spent face-to-face discussing treatment plan, counseling and coordination of care.  Orders placed this encounter:  Orders Placed This Encounter  Procedures  . Comprehensive metabolic panel  . CBC with Differential      [name], MD [place] [phone]

## 2019-01-27 NOTE — Assessment & Plan Note (Signed)
1.  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  Flow cytometry-12% blasts.  Chromosome analysis and FISH panel normal. -4 cycles of azacitidine 50 mg per metered square from 08/22/2018 through 12/02/2018. -Repeat bone marrow biopsy on 12/23/2018 at [facility] showed 25% blasts and 90% cellular bone marrow, consistent with AML with MDS related changes.  Flow cytometry showed 11% blasts.  MDS FISH panel was normal. -NGS panel did not show any targetable mutations. - Cycle 1 of decitabine 20 mg per metered square on 01/20/2019. - He is currently taking venetoclax 300 mg daily.  Denies any major GI side effects other than minor nausea which is controlled with Compazine. - CBC today shows hemoglobin 7.8, platelet count 19.  White count is 0.8. -he will receive 1 unit PRBC.  I will reevaluate him in 1 week with labs.  2.  CKD: - Creatinine stable between 1.3-1.4.  3.  Back pain and knee pains: -He is taking tramadol 3 times a day along with Tylenol. -He had injections into his both knees and right ankle last week.

## 2019-01-27 NOTE — Patient Instructions (Addendum)
[facility] at [facility] Discharge Instructions  You were seen today by Dr. Delton Coombes. He went over your recent lab results. Come back tomorrow for blood transfusion. He will see you back in 1 week for labs and follow up.   Thank you for choosing [facility] at [facility] to provide your oncology and hematology care.  To afford each patient quality time with our provider, please arrive at least 15 minutes before your scheduled appointment time.   If you have a lab appointment with the [facility] please come in thru the  Main Entrance and check in at the main information desk  You need to re-schedule your appointment should you arrive 10 or more minutes late.  We strive to give you quality time with our providers, and arriving late affects you and other patients whose appointments are after yours.  Also, if you no show three or more times for appointments you may be dismissed from the clinic at the providers discretion.     Again, thank you for choosing [facility].  Our hope is that these requests will decrease the amount of time that you wait before being seen by our physicians.       _____________________________________________________________  Should you have questions after your visit to [facility], please contact our office at [phone] between the hours of 8:00 a.m. and 4:30 p.m.  Voicemails left after 4:00 p.m. will not be returned until the following business day.  For prescription refill requests, have your pharmacy contact our office and allow 72 hours.    Cancer Center Support Programs:   > Cancer Support Group  2nd Tuesday of the month 1pm-2pm, Journey Room

## 2019-01-28 ENCOUNTER — Encounter (HOSPITAL_COMMUNITY): Payer: Self-pay

## 2019-01-28 ENCOUNTER — Inpatient Hospital Stay (HOSPITAL_COMMUNITY): Payer: Medicare Other

## 2019-01-28 ENCOUNTER — Other Ambulatory Visit: Payer: Self-pay

## 2019-01-28 VITALS — BP 144/70 | HR 77 | Temp 98.3°F | Resp 18

## 2019-01-28 DIAGNOSIS — D649 Anemia, unspecified: Secondary | ICD-10-CM

## 2019-01-28 DIAGNOSIS — C9200 Acute myeloblastic leukemia, not having achieved remission: Secondary | ICD-10-CM

## 2019-01-28 LAB — PREPARE RBC (CROSSMATCH)

## 2019-01-28 MED ORDER — DIPHENHYDRAMINE HCL 25 MG PO CAPS
25.0000 mg | ORAL_CAPSULE | Freq: Once | ORAL | Status: AC
  Administered 2019-01-28: 09:00:00 25 mg via ORAL

## 2019-01-28 MED ORDER — HEPARIN SOD (PORK) LOCK FLUSH 100 UNIT/ML IV SOLN
500.0000 [IU] | Freq: Every day | INTRAVENOUS | Status: AC | PRN
  Administered 2019-01-28: 500 [IU]

## 2019-01-28 MED ORDER — SODIUM CHLORIDE 0.9% IV SOLUTION
250.0000 mL | Freq: Once | INTRAVENOUS | Status: AC
  Administered 2019-01-28: 250 mL via INTRAVENOUS

## 2019-01-28 MED ORDER — DIPHENHYDRAMINE HCL 25 MG PO CAPS
ORAL_CAPSULE | ORAL | Status: AC
  Filled 2019-01-28: qty 1

## 2019-01-28 MED ORDER — ACETAMINOPHEN 325 MG PO TABS
ORAL_TABLET | ORAL | Status: AC
  Filled 2019-01-28: qty 2

## 2019-01-28 MED ORDER — ACETAMINOPHEN 325 MG PO TABS
650.0000 mg | ORAL_TABLET | Freq: Once | ORAL | Status: AC
  Administered 2019-01-28: 650 mg via ORAL

## 2019-01-28 MED ORDER — SODIUM CHLORIDE 0.9% FLUSH
10.0000 mL | INTRAVENOUS | Status: AC | PRN
  Administered 2019-01-28: 10 mL

## 2019-01-28 NOTE — Progress Notes (Signed)
Pt presents today for 1 unit of PRBC's. VSS. Pt has no complaints of any significant changes since the last visit. MAR reviewed.   1 unit of prbc's given today per MD orders. Tolerated infusion without adverse affects. Vital signs stable. No complaints at this time. Discharged from clinic ambulatory. F/U with [facility] as scheduled.

## 2019-01-28 NOTE — Patient Instructions (Signed)
[facility]  Discharge Instructions:   _______________________________________________________________  Thank you for choosing [facility] at [facility] to provide your oncology and hematology care.  To afford each patient quality time with our providers, please arrive at least 15 minutes before your scheduled appointment.  You need to re-schedule your appointment if you arrive 10 or more minutes late.  We strive to give you quality time with our providers, and arriving late affects you and other patients whose appointments are after yours.  Also, if you no show three or more times for appointments you may be dismissed from the clinic.  Again, thank you for choosing [facility] at [facility]. Our hope is that these requests will allow you access to exceptional care and in a timely manner. _______________________________________________________________  If you have questions after your visit, please contact our office at [phone] between the hours of 8:30 a.m. and 5:00 p.m. Voicemails left after 4:30 p.m. will not be returned until the following business day. _______________________________________________________________  For prescription refill requests, have your pharmacy contact our office. _______________________________________________________________  Recommendations made by the consultant and any test results will be sent to your referring physician. _______________________________________________________________ 

## 2019-01-29 LAB — BPAM RBC
Blood Product Expiration Date: 202010242359
ISSUE DATE / TIME: 202009221036
Unit Type and Rh: 5100

## 2019-01-29 LAB — TYPE AND SCREEN
ABO/RH(D): O POS
Antibody Screen: NEGATIVE
Unit division: 0

## 2019-02-03 ENCOUNTER — Encounter (HOSPITAL_COMMUNITY): Payer: Self-pay | Admitting: *Deleted

## 2019-02-03 ENCOUNTER — Other Ambulatory Visit: Payer: Self-pay

## 2019-02-03 ENCOUNTER — Encounter (HOSPITAL_COMMUNITY): Payer: Self-pay | Admitting: Emergency Medicine

## 2019-02-03 ENCOUNTER — Inpatient Hospital Stay (HOSPITAL_COMMUNITY): Payer: Medicare Other

## 2019-02-03 ENCOUNTER — Observation Stay (HOSPITAL_COMMUNITY)
Admission: EM | Admit: 2019-02-03 | Discharge: 2019-02-04 | Disposition: A | Discharge status: Home / Self Care | Payer: Medicare Other | Attending: Internal Medicine | Admitting: Internal Medicine

## 2019-02-03 ENCOUNTER — Ambulatory Visit (HOSPITAL_COMMUNITY): Payer: Medicare Other | Admitting: Hematology

## 2019-02-03 DIAGNOSIS — K5900 Constipation, unspecified: Secondary | ICD-10-CM | POA: Diagnosis not present

## 2019-02-03 DIAGNOSIS — K649 Unspecified hemorrhoids: Secondary | ICD-10-CM | POA: Diagnosis present

## 2019-02-03 DIAGNOSIS — K648 Other hemorrhoids: Secondary | ICD-10-CM | POA: Insufficient documentation

## 2019-02-03 DIAGNOSIS — Z791 Long term (current) use of non-steroidal anti-inflammatories (NSAID): Secondary | ICD-10-CM | POA: Diagnosis not present

## 2019-02-03 DIAGNOSIS — D649 Anemia, unspecified: Secondary | ICD-10-CM | POA: Diagnosis not present

## 2019-02-03 DIAGNOSIS — Z87891 Personal history of nicotine dependence: Secondary | ICD-10-CM | POA: Diagnosis not present

## 2019-02-03 DIAGNOSIS — M1990 Unspecified osteoarthritis, unspecified site: Secondary | ICD-10-CM | POA: Diagnosis not present

## 2019-02-03 DIAGNOSIS — Z7951 Long term (current) use of inhaled steroids: Secondary | ICD-10-CM | POA: Diagnosis not present

## 2019-02-03 DIAGNOSIS — Z79899 Other long term (current) drug therapy: Secondary | ICD-10-CM | POA: Diagnosis not present

## 2019-02-03 DIAGNOSIS — Z981 Arthrodesis status: Secondary | ICD-10-CM | POA: Insufficient documentation

## 2019-02-03 DIAGNOSIS — C9200 Acute myeloblastic leukemia, not having achieved remission: Secondary | ICD-10-CM | POA: Diagnosis not present

## 2019-02-03 DIAGNOSIS — K219 Gastro-esophageal reflux disease without esophagitis: Secondary | ICD-10-CM | POA: Insufficient documentation

## 2019-02-03 DIAGNOSIS — Z87442 Personal history of urinary calculi: Secondary | ICD-10-CM | POA: Diagnosis not present

## 2019-02-03 DIAGNOSIS — K644 Residual hemorrhoidal skin tags: Secondary | ICD-10-CM | POA: Diagnosis not present

## 2019-02-03 DIAGNOSIS — N183 Chronic kidney disease, stage 3 (moderate): Secondary | ICD-10-CM | POA: Diagnosis not present

## 2019-02-03 DIAGNOSIS — K625 Hemorrhage of anus and rectum: Principal | ICD-10-CM | POA: Insufficient documentation

## 2019-02-03 DIAGNOSIS — I129 Hypertensive chronic kidney disease with stage 1 through stage 4 chronic kidney disease, or unspecified chronic kidney disease: Secondary | ICD-10-CM | POA: Diagnosis not present

## 2019-02-03 DIAGNOSIS — Z85820 Personal history of malignant melanoma of skin: Secondary | ICD-10-CM | POA: Diagnosis not present

## 2019-02-03 DIAGNOSIS — D696 Thrombocytopenia, unspecified: Secondary | ICD-10-CM | POA: Diagnosis present

## 2019-02-03 DIAGNOSIS — Z20828 Contact with and (suspected) exposure to other viral communicable diseases: Secondary | ICD-10-CM | POA: Insufficient documentation

## 2019-02-03 LAB — CBC WITH DIFFERENTIAL/PLATELET
Abs Immature Granulocytes: 0 10*3/uL (ref 0.00–0.07)
Basophils Absolute: 0 10*3/uL (ref 0.0–0.1)
Basophils Relative: 0 %
Eosinophils Absolute: 0 10*3/uL (ref 0.0–0.5)
Eosinophils Relative: 0 %
HCT: 27.6 % — ABNORMAL LOW (ref 39.0–52.0)
Hemoglobin: 8 g/dL — ABNORMAL LOW (ref 13.0–17.0)
Immature Granulocytes: 0 %
Lymphocytes Relative: 81 %
Lymphs Abs: 0.4 10*3/uL — ABNORMAL LOW (ref 0.7–4.0)
MCH: 30.3 pg (ref 26.0–34.0)
MCHC: 29 g/dL — ABNORMAL LOW (ref 30.0–36.0)
MCV: 104.5 fL — ABNORMAL HIGH (ref 80.0–100.0)
Monocytes Absolute: 0 10*3/uL — ABNORMAL LOW (ref 0.1–1.0)
Monocytes Relative: 2 %
Neutro Abs: 0.1 10*3/uL — ABNORMAL LOW (ref 1.7–7.7)
Neutrophils Relative %: 17 %
Platelets: 9 10*3/uL — CL (ref 150–400)
RBC: 2.64 MIL/uL — ABNORMAL LOW (ref 4.22–5.81)
RDW: 19.2 % — ABNORMAL HIGH (ref 11.5–15.5)
WBC: 0.5 10*3/uL — CL (ref 4.0–10.5)
nRBC: 13 % — ABNORMAL HIGH (ref 0.0–0.2)

## 2019-02-03 LAB — COMPREHENSIVE METABOLIC PANEL
ALT: 21 U/L (ref 0–44)
AST: 20 U/L (ref 15–41)
Albumin: 3.4 g/dL — ABNORMAL LOW (ref 3.5–5.0)
Alkaline Phosphatase: 50 U/L (ref 38–126)
Anion gap: 9 (ref 5–15)
BUN: 26 mg/dL — ABNORMAL HIGH (ref 8–23)
CO2: 22 mmol/L (ref 22–32)
Calcium: 8.8 mg/dL — ABNORMAL LOW (ref 8.9–10.3)
Chloride: 108 mmol/L (ref 98–111)
Creatinine, Ser: 1.2 mg/dL (ref 0.61–1.24)
GFR calc Af Amer: >60 mL/min (ref >60)
GFR calc non Af Amer: 59 mL/min — ABNORMAL LOW (ref >60)
Glucose, Bld: 93 mg/dL (ref 70–99)
Potassium: 4.3 mmol/L (ref 3.5–5.1)
Sodium: 139 mmol/L (ref 135–145)
Total Bilirubin: 1.4 mg/dL — ABNORMAL HIGH (ref 0.3–1.2)
Total Protein: 5.8 g/dL — ABNORMAL LOW (ref 6.5–8.1)

## 2019-02-03 LAB — MRSA PCR SCREENING: MRSA by PCR: NEGATIVE

## 2019-02-03 LAB — SARS CORONAVIRUS 2 BY RT PCR (HOSPITAL ORDER, PERFORMED IN ~~LOC~~ HOSPITAL LAB): SARS Coronavirus 2: NEGATIVE

## 2019-02-03 MED ORDER — CHLORHEXIDINE GLUCONATE CLOTH 2 % EX PADS
6.0000 | MEDICATED_PAD | Freq: Every day | CUTANEOUS | Status: DC
  Administered 2019-02-03: 6 via TOPICAL

## 2019-02-03 MED ORDER — ONDANSETRON HCL 4 MG PO TABS: 4.0000 mg | ORAL_TABLET | Freq: Four times a day (QID) | ORAL | Status: DC | PRN

## 2019-02-03 MED ORDER — SODIUM CHLORIDE 0.9 % IV SOLN: 250.0000 mL | INTRAVENOUS | Status: DC | PRN

## 2019-02-03 MED ORDER — SODIUM CHLORIDE 0.9% IV SOLUTION
Freq: Once | INTRAVENOUS | Status: AC
  Administered 2019-02-03: 17:00:00 via INTRAVENOUS

## 2019-02-03 MED ORDER — TRAMADOL HCL 50 MG PO TABS: 50.0000 mg | ORAL_TABLET | Freq: Three times a day (TID) | ORAL | Status: DC | PRN

## 2019-02-03 MED ORDER — ACETAMINOPHEN 650 MG RE SUPP: 650.0000 mg | Freq: Four times a day (QID) | RECTAL | Status: DC | PRN

## 2019-02-03 MED ORDER — ALLOPURINOL 300 MG PO TABS
300.0000 mg | ORAL_TABLET | Freq: Every evening | ORAL | Status: DC
  Administered 2019-02-03: 300 mg via ORAL
  Filled 2019-02-03 (×3): qty 1

## 2019-02-03 MED ORDER — DICLOFENAC SODIUM 1 % TD GEL
2.0000 g | Freq: Three times a day (TID) | TRANSDERMAL | Status: DC | PRN
  Filled 2019-02-03: qty 100

## 2019-02-03 MED ORDER — LORATADINE 10 MG PO TABS
10.0000 mg | ORAL_TABLET | Freq: Every day | ORAL | Status: DC
  Administered 2019-02-03 – 2019-02-04 (×2): 10 mg via ORAL
  Filled 2019-02-03 (×2): qty 1

## 2019-02-03 MED ORDER — ACETAMINOPHEN 325 MG PO TABS: 650.0000 mg | ORAL_TABLET | Freq: Four times a day (QID) | ORAL | Status: DC | PRN

## 2019-02-03 MED ORDER — ONDANSETRON HCL 4 MG/2ML IJ SOLN: 4.0000 mg | Freq: Four times a day (QID) | INTRAMUSCULAR | Status: DC | PRN

## 2019-02-03 MED ORDER — SODIUM CHLORIDE 0.9% FLUSH: 3.0000 mL | INTRAVENOUS | Status: DC | PRN

## 2019-02-03 MED ORDER — ACETAMINOPHEN 500 MG PO TABS: 1000.0000 mg | ORAL_TABLET | Freq: Four times a day (QID) | ORAL | Status: DC | PRN

## 2019-02-03 NOTE — ED Notes (Signed)
Date and time results received: 02/03/19 12:27 PM  (use smartphrase ".now" to insert current time)  Test: WBC Critical Value: 0.5  Test: RBC Critical Value: 9  Name of Provider Notified: Alyse Low  Orders Received? Or Actions Taken?: Orders Received - See Orders for details

## 2019-02-03 NOTE — H&P (Signed)
History and Physical    Richard Wallace DOB: [birth year] DOA: 02/03/2019  PCP: [name], MD   Patient coming from: Home  Chief Complaint:  Rectal bleeding  HPI: Richard Wallace is a 73 y.o. 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  He denies any associated abdominal pain, nausea, vomiting, or diarrhea.  He does struggle with constipation intermittently.  He states his last colonoscopy was about 9 years ago.  He does have internal and external hemorrhoids.  He does not complain of any lightheadedness, dizziness, chest pain, or shortness of breath.  No fevers or chills.  Patient is not currently on any blood thinners.   ED Course: Vital signs are stable and patient is afebrile.  COVID testing is pending.  He is noted to be pancytopenic with platelet count of 9000 and hemoglobin of 8.  EDP had spoken with Dr. Delton Coombes, his oncologist who recommends 2 units of platelet transfusion and to hold his chemotherapy medication.  He is not actively bleeding at the moment.  Review of Systems: All others reviewed and otherwise negative except as noted above.  Past Medical History:  Diagnosis Date  . Arthritis   . 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 Cancer ([place]) 01-28-2014   skin cancer  . CKD (chronic kidney disease), stage III ([place])   . GERD (gastroesophageal reflux disease)   . Heart murmur    NOTED DURING PHYSICAL WHEN HE WAS ENLISTING IN [place] , DIDNT KNOW UNTIL THAT TIME AND REPORTS , "THATS THE LAST I HEARD ABOUT IT "   . History of hypertension    no longer issue  . History of kidney stones   . History of malignant melanoma of skin    excision top of scalp 2015-- no recurrence  . History of urinary retention    post op lumbar fusion surgery 04/ 2016  .  Hypertension   . 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 Left ureteral calculus   . Seasonal allergies   . Wears glasses   . Wears glasses   . Wears partial dentures    upper and lower    Past Surgical History:  Procedure Laterality Date  . ANKLE FUSION Right 2007  . CARPAL TUNNEL RELEASE Left 12/28/2009   w/ pulley release left long finger  . CARPAL TUNNEL RELEASE Right 07/22/2013   Procedure: RIGHT CARPAL TUNNEL RELEASE;  Surgeon: Cammie Sickle., MD;  Location: [facility];  Service: Orthopedics;  Laterality: Right;  . COLONOSCOPY    . CYSTO/  LEFT RETROGRADE PYELOGRAM  11/21/2010  . CYSTOSCOPY WITH STENT PLACEMENT Left 03/09/2016   Procedure: CYSTOSCOPY WITH STENT PLACEMENT;  Surgeon: Franchot Gallo, MD;  Location: [facility];  Service: Urology;  Laterality: Left;  . CYSTOSCOPY/RETROGRADE/URETEROSCOPY/STONE EXTRACTION WITH BASKET Left 03/09/2016   Procedure: CYSTOSCOPY/RETROGRADE/URETEROSCOPY/STONE EXTRACTION WITH BASKET;  Surgeon: Franchot Gallo, MD;  Location: [facility];  Service: Urology;  Laterality: Left;  . LEFT URETEROSCOPIC LASER LITHOTRIPSY STONE EXTRACTION/ STENT PLACEMENT  05/23/2010  . MOHS SURGERY     TOP OF THE HEAD   . ORIF ANKLE FRACTURE Right 1978  . PORTACATH PLACEMENT Right 08/19/2018   Procedure: INSERTION PORT-A-CATH (attached  catheter in right subclavian);  Surgeon: Aviva Signs, MD;  Location: [facility];  Service: General;  Laterality: Right;  . POSTERIOR LUMBAR FUSION  08/21/2014   laminectomy and decompression L2 -- L5  . RIGHT LOWER LEG SURGERY  X3  1975 to 1976   including ORIF  . TONSILLECTOMY AND ADENOIDECTOMY  1986  . UMBILICAL HERNIA REPAIR  2009 approx     reports that he quit smoking about 32 years ago. His smoking use included cigarettes. He quit after 20.00 years of use. He has never used smokeless tobacco. He reports  current alcohol use of about 7.0 - 14.0 standard drinks of alcohol per week. He reports that he does not use drugs.  No Known Allergies  Family History  Problem Relation Age of Onset  . Stroke Mother   . Prostate cancer Father   . Bone cancer Father   . Diverticulitis Father   . Rheum arthritis Sister   . Urinary tract infection Sister   . Colon cancer Neg Hx     Prior to Admission medications   Medication Sig Start Date End Date Taking? Authorizing Provider  acetaminophen (TYLENOL) 500 MG tablet Take 1,000 mg by mouth every 6 (six) hours as needed for mild pain or moderate pain.    Yes [provider]  allopurinol (ZYLOPRIM) 300 MG tablet Take 300 mg by mouth every evening.   Yes [provider]  celecoxib (CELEBREX) 200 MG capsule Take 200 mg by mouth daily. IN THE MORNING   Yes [provider]  cetirizine (ZYRTEC) 10 MG tablet Take 10 mg by mouth daily. IN THE MORNING   Yes [provider]  diclofenac sodium (VOLTAREN) 1 % GEL Apply 1 g topically 3 (three) times daily as needed (knee pain.).  07/02/18  Yes [provider]  docusate sodium (COLACE) 100 MG capsule Take 100 mg by mouth at bedtime.    Yes [provider]  magnesium oxide (MAG-OX) 400 MG tablet Take 400 mg by mouth at bedtime.    Yes [provider]  traMADol (ULTRAM) 50 MG tablet TAKE 1 TABLET BY MOUTH THREE TIMES DAILY AS NEEDED FOR PAIN 01/27/19  Yes Lockamy, Randi L, NP-C  venetoclax 100 MG TABS Take 400 mg by mouth daily. 01/14/19  Yes [name], MD    Physical Exam: Vitals:   02/03/19 1130 02/03/19 1200 02/03/19 1230 02/03/19 1300  BP: (!) 141/72 134/67 134/73 120/74  Pulse: 75 75 74 75  Resp: 18     SpO2: 98% 96% 96% 96%  Weight:      Height:        Constitutional: NAD, calm, comfortable Vitals:   02/03/19 1130 02/03/19 1200 02/03/19 1230 02/03/19 1300  BP: (!) 141/72 134/67 134/73 120/74  Pulse: 75 75 74 75  Resp: 18     SpO2: 98%  96% 96% 96%  Weight:      Height:       Eyes: lids and conjunctivae normal ENMT: Mucous membranes are moist.  Neck: normal, supple Respiratory: clear to auscultation bilaterally. Normal respiratory effort. No accessory muscle use.  Cardiovascular: Regular rate and rhythm, no murmurs. No extremity edema. Abdomen: no tenderness, no distention. Bowel sounds positive.  Musculoskeletal:  No joint deformity upper and lower extremities.   Skin: no rashes, lesions, ulcers.  Psychiatric: Normal judgment and insight. Alert and oriented x 3. Normal mood.   Labs on Admission: I have personally reviewed following labs and imaging studies  CBC: Recent Labs  Lab 02/03/19 1115  WBC 0.5*  NEUTROABS 0.1*  HGB 8.0*  HCT 27.6*  MCV 104.5*  PLT 9*   Basic Metabolic Panel: Recent Labs  Lab 02/03/19 1115  NA 139  K 4.3  CL 108  CO2 22  GLUCOSE 93  BUN 26*  CREATININE 1.20  CALCIUM 8.8*   GFR: Estimated Creatinine Clearance: 64.7 mL/min (by C-G formula based on SCr of 1.2 mg/dL). Liver Function Tests: Recent Labs  Lab 02/03/19 1115  AST 20  ALT 21  ALKPHOS 50  BILITOT 1.4*  PROT 5.8*  ALBUMIN 3.4*   No results for input(s): LIPASE, AMYLASE in the last 168 hours. No results for input(s): AMMONIA in the last 168 hours. Coagulation Profile: No results for input(s): INR, PROTIME in the last 168 hours. Cardiac Enzymes: No results for input(s): CKTOTAL, CKMB, CKMBINDEX, TROPONINI in the last 168 hours. BNP (last 3 results) No results for input(s): PROBNP in the last 8760 hours. HbA1C: No results for input(s): HGBA1C in the last 72 hours. CBG: No results for input(s): GLUCAP in the last 168 hours. Lipid Profile: No results for input(s): CHOL, HDL, LDLCALC, TRIG, CHOLHDL, LDLDIRECT in the last 72 hours. Thyroid Function Tests: No results for input(s): TSH, T4TOTAL, FREET4, T3FREE, THYROIDAB in the last 72 hours. Anemia Panel: No results for input(s): VITAMINB12, FOLATE,  FERRITIN, TIBC, IRON, RETICCTPCT in the last 72 hours. Urine analysis:    Component Value Date/Time   COLORURINE AMBER (A) 12/19/2018 1204   APPEARANCEUR HAZY (A) 12/19/2018 1204   LABSPEC 1.026 12/19/2018 1204   PHURINE 5.0 12/19/2018 1204   GLUCOSEU NEGATIVE 12/19/2018 1204   HGBUR LARGE (A) 12/19/2018 1204   BILIRUBINUR NEGATIVE 12/19/2018 1204   KETONESUR NEGATIVE 12/19/2018 1204   PROTEINUR 30 (A) 12/19/2018 1204   UROBILINOGEN 1.0 08/24/2014 1630   NITRITE NEGATIVE 12/19/2018 1204   LEUKOCYTESUR NEGATIVE 12/19/2018 1204    Radiological Exams on Admission: No results found.  EKG: Independently reviewed. 88bpm, SR.  Assessment/Plan Active Problems:   Rectal bleeding    Rectal bleeding likely secondary to severe thrombocytopenia -Platelet transfusion as ordered -We will hold venetoclax as recommended -Monitor closely with repeat CBC in a.m. -GI consultation obtained to ensure that patient does not require any further testing such as flex sigmoidoscopy -Transfuse PRBCs for hemoglobin less than 7  CKD stage III -Currently stable and will monitor repeat labs  AML with pancytopenia -Per oncology outpatient -Monitor CBC  Chronic pain -Continue Voltaren gel and tramadol as needed  DVT prophylaxis: SCDs Code Status: Full Family Communication: Wife at bedside Disposition Plan:Admit for platelet transfusion/GI bleed evaluation Consults called:GI Admission status: Obs, MedSurg   Duane Earnshaw Darleen Crocker DO [facility] Pager [phone]  If 7PM-7AM, please contact night-coverage [email or web address] Password [facility]  02/03/2019, 2:28 PM

## 2019-02-03 NOTE — ED Notes (Signed)
ED TO INPATIENT HANDOFF REPORT  ED Nurse Name and Phone #: Lupita Dawn Name/Age/Gender Richard Wallace 73 y.o. male Room/Bed: APOTF/OTF  Code Status   Code Status: Full Code  Home/SNF/Other Home Patient oriented to: self, place, time and situation Is this baseline? Yes   Triage Complete: Triage complete  Chief Complaint Rectal bleeding  Triage Note Pt states that about 0930 this morning he started passing bright red.  He is a chemo therapy pt     Allergies No Known Allergies  Level of Care/Admitting Diagnosis ED Disposition    ED Disposition Condition [facility]: [facility] [id number]  Level of Care: Med-Surg [16]  Covid Evaluation: N/A  Diagnosis: Rectal bleeding [id number]  Admitting Physician: Rodena Goldmann [id number]  Attending Physician: Heath Lark D [id number]  PT Class (Do Not Modify): Observation [104]  PT Acc Code (Do Not Modify): Observation [10022]       B Medical/Surgery History Past Medical History:  Diagnosis Date  . Arthritis   . 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 Cancer ([place]) 01-28-2014   skin cancer  . CKD (chronic kidney disease), stage III ([place])   . GERD (gastroesophageal reflux disease)   . Heart murmur    NOTED DURING PHYSICAL WHEN HE WAS ENLISTING IN MILITARY , DIDNT KNOW UNTIL THAT TIME AND REPORTS , "THATS THE LAST I HEARD ABOUT IT "   . History of hypertension    no longer issue  . History of kidney stones   . History of malignant melanoma of skin    excision top of scalp 2015-- no recurrence  . History of urinary retention    post op lumbar fusion surgery 04/ 2016  . Hypertension   . 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 Left ureteral calculus   . Seasonal allergies   . Wears glasses   . Wears glasses   . Wears partial dentures    upper and lower   Past Surgical History:  Procedure Laterality Date  . ANKLE  FUSION Right 2007  . CARPAL TUNNEL RELEASE Left 12/28/2009   w/ pulley release left long finger  . CARPAL TUNNEL RELEASE Right 07/22/2013   Procedure: RIGHT CARPAL TUNNEL RELEASE;  Surgeon: Cammie Sickle., MD;  Location: [facility];  Service: Orthopedics;  Laterality: Right;  . COLONOSCOPY    . CYSTO/  LEFT RETROGRADE PYELOGRAM  11/21/2010  . CYSTOSCOPY WITH STENT PLACEMENT Left 03/09/2016   Procedure: CYSTOSCOPY WITH STENT PLACEMENT;  Surgeon: Franchot Gallo, MD;  Location: [facility];  Service: Urology;  Laterality: Left;  . CYSTOSCOPY/RETROGRADE/URETEROSCOPY/STONE EXTRACTION WITH BASKET Left 03/09/2016   Procedure: CYSTOSCOPY/RETROGRADE/URETEROSCOPY/STONE EXTRACTION WITH BASKET;  Surgeon: Franchot Gallo, MD;  Location: [facility];  Service: Urology;  Laterality: Left;  . LEFT URETEROSCOPIC LASER LITHOTRIPSY STONE EXTRACTION/ STENT PLACEMENT  05/23/2010  . MOHS SURGERY     TOP OF THE HEAD   . ORIF ANKLE FRACTURE Right 1978  . PORTACATH PLACEMENT Right 08/19/2018   Procedure: INSERTION PORT-A-CATH (attached catheter in right subclavian);  Surgeon: Aviva Signs, MD;  Location: [facility];  Service: General;  Laterality: Right;  . POSTERIOR LUMBAR FUSION  08/21/2014   laminectomy and decompression L2 -- L5  . RIGHT LOWER LEG SURGERY  X3  1975 to 1976   including ORIF  . TONSILLECTOMY AND ADENOIDECTOMY  1986  .  UMBILICAL HERNIA REPAIR  2009 approx     A IV Location/Drains/Wounds Patient Lines/Drains/Airways Status   Active Line/Drains/Airways    Name:   Placement date:   Placement time:   Site:   Days:   Implanted Port 08/19/18 Right Chest   08/19/18    0901    Chest   168   Urethral Catheter Izora Gala Irish Coude 16 Fr.   08/28/14    0118    Coude   1620   Urethral Catheter   -    -    -      Ureteral Drain/Stent Left ureter 6 Fr.   03/09/16    1002    Left ureter   1061   Incision (Closed) 08/21/14 Back Other (Comment)   08/21/14     1823     1627   Incision (Closed) 03/09/16 Penis   03/09/16    0811     1061   Incision (Closed) 08/19/18 Chest Right   08/19/18    0918     168          Intake/Output Last 24 hours No intake or output data in the 24 hours ending 02/03/19 1542  Labs/Imaging Results for orders placed or performed during the hospital encounter of 02/03/19 (from the past 48 hour(s))  CBC with Differential/Platelet     Status: Abnormal   Collection Time: 02/03/19 11:15 AM  Result Value Ref Range   WBC 0.5 (LL) 4.0 - 10.5 K/uL    Comment: REPEATED TO VERIFY WHITE COUNT CONFIRMED ON SMEAR THIS CRITICAL RESULT HAS VERIFIED AND BEEN CALLED TO T AGEE JACKSON BY HILLARY FLYNT ON 09 28 2020 AT 1219, AND HAS BEEN READ BACK.     RBC 2.64 (L) 4.22 - 5.81 MIL/uL   Hemoglobin 8.0 (L) 13.0 - 17.0 g/dL   HCT 27.6 (L) 39.0 - 52.0 %   MCV 104.5 (H) 80.0 - 100.0 fL   MCH 30.3 26.0 - 34.0 pg   MCHC 29.0 (L) 30.0 - 36.0 g/dL   RDW 19.2 (H) 11.5 - 15.5 %   Platelets 9 (LL) 150 - 400 K/uL    Comment: PLATELET COUNT CONFIRMED BY SMEAR Immature Platelet Fraction may be clinically indicated, consider ordering this additional test [id number] THIS CRITICAL RESULT HAS VERIFIED AND BEEN CALLED TO T AGEE JACKSON BY HILLARY FLYNT ON 09 28 2020 AT 1219, AND HAS BEEN READ BACK.     nRBC 13.0 (H) 0.0 - 0.2 %   Neutrophils Relative % 17 %   Neutro Abs 0.1 (L) 1.7 - 7.7 K/uL   Lymphocytes Relative 81 %   Lymphs Abs 0.4 (L) 0.7 - 4.0 K/uL   Monocytes Relative 2 %   Monocytes Absolute 0.0 (L) 0.1 - 1.0 K/uL   Eosinophils Relative 0 %   Eosinophils Absolute 0.0 0.0 - 0.5 K/uL   Basophils Relative 0 %   Basophils Absolute 0.0 0.0 - 0.1 K/uL   WBC Morphology      CRITICAL WBC COUNT OF 0.5 CALLED TO READ BACK FROM BRIAN Abbagayle Zaragoza AT 1225 BY HFLYNT 09 28 20   RBC Morphology ANISOCYTOSIS NRBCS PRESENT    Smear Review      CRITICAL PLATELET COUNT 9 CALLED TO READ BACK FROM BRIAN Versie Soave AT 1225 BY HFLYNT 09 28 20   Immature  Granulocytes 0 %   Abs Immature Granulocytes 0.00 0.00 - [id number] K/uL   Basophilic Stippling PRESENT     Comment: Performed at [facility],  [address], [place], [place] [address]  Comprehensive metabolic panel     Status: Abnormal   Collection Time: 02/03/19 11:15 AM  Result Value Ref Range   Sodium 139 135 - 145 mmol/L   Potassium 4.3 3.5 - 5.1 mmol/L   Chloride 108 98 - 111 mmol/L   CO2 22 22 - 32 mmol/L   Glucose, Bld 93 70 - 99 mg/dL   BUN 26 (H) 8 - 23 mg/dL   Creatinine, Ser 1.20 0.61 - 1.24 mg/dL   Calcium 8.8 (L) 8.9 - 10.3 mg/dL   Total Protein 5.8 (L) 6.5 - 8.1 g/dL   Albumin 3.4 (L) 3.5 - 5.0 g/dL   AST 20 15 - 41 U/L   ALT 21 0 - 44 U/L   Alkaline Phosphatase 50 38 - 126 U/L   Total Bilirubin 1.4 (H) 0.3 - 1.2 mg/dL   GFR calc non Af Amer 59 (L) >60 mL/min   GFR calc Af Amer >60 >60 mL/min   Anion gap 9 5 - 15    Comment: Performed at [facility], [address] [place], [place] [address]  SARS Coronavirus 2 [facility] order, Performed in [facility] hospital lab) Nasopharyngeal Nasopharyngeal Swab     Status: None   Collection Time: 02/03/19 12:31 PM   Specimen: Nasopharyngeal Swab  Result Value Ref Range   SARS Coronavirus 2 NEGATIVE NEGATIVE    Comment: (NOTE) If result is NEGATIVE SARS-CoV-2 target nucleic acids are NOT DETECTED. The SARS-CoV-2 RNA is generally detectable in upper and lower  respiratory specimens during the acute phase of infection. The lowest  concentration of SARS-CoV-2 viral copies this assay can detect is 250  copies / mL. A negative result does not preclude SARS-CoV-2 infection  and should not be used as the sole basis for treatment or other  patient management decisions.  A negative result may occur with  improper specimen collection / handling, submission of specimen other  than nasopharyngeal swab, presence of viral mutation(s) within the  areas targeted by this assay, and inadequate number of viral copies  (<250 copies / mL). A  negative result must be combined with clinical  observations, patient history, and epidemiological information. If result is POSITIVE SARS-CoV-2 target nucleic acids are DETECTED. The SARS-CoV-2 RNA is generally detectable in upper and lower  respiratory specimens dur ing the acute phase of infection.  Positive  results are indicative of active infection with SARS-CoV-2.  Clinical  correlation with patient history and other diagnostic information is  necessary to determine patient infection status.  Positive results do  not rule out bacterial infection or co-infection with other viruses. If result is PRESUMPTIVE POSTIVE SARS-CoV-2 nucleic acids MAY BE PRESENT.   A presumptive positive result was obtained on the submitted specimen  and confirmed on repeat testing.  While 2019 novel coronavirus  (SARS-CoV-2) nucleic acids may be present in the submitted sample  additional confirmatory testing may be necessary for epidemiological  and / or clinical management purposes  to differentiate between  SARS-CoV-2 and other Sarbecovirus currently known to infect humans.  If clinically indicated additional testing with an alternate test  methodology [phone]) is advised. The SARS-CoV-2 RNA is generally  detectable in upper and lower respiratory sp ecimens during the acute  phase of infection. The expected result is Negative. Fact Sheet for Patients:  StrictlyIdeas.no Fact Sheet for Healthcare Providers: BankingDealers.co.za This test is not yet approved or cleared by the [place] FDA and has been authorized for detection and/or  diagnosis of SARS-CoV-2 by FDA under an Emergency Use Authorization (EUA).  This EUA will remain in effect (meaning this test can be used) for the duration of the COVID-19 declaration under Section 564(b)(1) of the Act, 21 U.S.C. section 360bbb-3(b)(1), unless the authorization is terminated or revoked sooner. Performed  at [facility], [address], [place], [place] [address]    No results found.  Pending Labs Unresulted Labs (From admission, onward)    Start     Ordered   02/04/19 0500  CBC  Tomorrow morning,   R     02/03/19 1444   02/04/19 [id number]  Basic metabolic panel  Tomorrow morning,   R     02/03/19 1444   02/03/19 1542  MRSA PCR Screening  Once,   STAT    Question:  Patient immune status  Answer:  Normal   02/03/19 1541   02/03/19 1411  Prepare Pheresed Platelets  (Adult Blood Administration - Platelets (Pheresed))  Once,   R    Question Answer Comment  Number of Apheresis Units 2 units (12-20 packs)   Transfusion Indications Platelet Dysfunction      02/03/19 1410          Vitals/Pain Today's Vitals   02/03/19 1130 02/03/19 1200 02/03/19 1230 02/03/19 1300  BP: (!) 141/72 134/67 134/73 120/74  Pulse: 75 75 74 75  Resp: 18     SpO2: 98% 96% 96% 96%  Weight:      Height:      PainSc:        Isolation Precautions No active isolations  Medications Medications  0.9 %  sodium chloride infusion (Manually program via Guardrails IV Fluids) (has no administration in time range)  acetaminophen (TYLENOL) tablet 1,000 mg (has no administration in time range)  allopurinol (ZYLOPRIM) tablet 300 mg (has no administration in time range)  traMADol (ULTRAM) tablet 50 mg (has no administration in time range)  loratadine (CLARITIN) tablet 10 mg (has no administration in time range)  diclofenac sodium (VOLTAREN) 1 % transdermal gel 2 g (has no administration in time range)  sodium chloride flush (NS) 0.9 % injection 3 mL (has no administration in time range)  0.9 %  sodium chloride infusion (has no administration in time range)  acetaminophen (TYLENOL) tablet 650 mg (has no administration in time range)    Or  acetaminophen (TYLENOL) suppository 650 mg (has no administration in time range)  ondansetron (ZOFRAN) tablet 4 mg (has no administration in time range)    Or  ondansetron (ZOFRAN)  injection 4 mg (has no administration in time range)    Mobility walks Low fall risk   Focused Assessments GI   R Recommendations: See Admitting Provider Note  Report given to:   Additional Notes:  Oncology patient being treated for leukemia. Neutropenic.

## 2019-02-03 NOTE — ED Triage Notes (Signed)
Pt states that about 0930 this morning he started passing bright red.  He is a chemo therapy pt

## 2019-02-03 NOTE — ED Provider Notes (Signed)
[facility] EMERGENCY DEPARTMENT Provider Note   CSN: [id number] Arrival date & time: 02/03/19  1008     History   Chief Complaint Chief Complaint  Patient presents with  . Rectal Bleeding    HPI Richard Wallace is a 73 y.o. male.     The history is provided by the patient. No language interpreter was used.  Rectal Bleeding Quality:  Bright red Amount:  Moderate Timing:  Constant Chronicity:  New Context: constipation   Similar prior episodes: no   Relieved by:  Nothing Worsened by:  Nothing Ineffective treatments:  None tried Associated symptoms: no abdominal pain   Risk factors comment:  Leukemia Pt is suppose to see his Oncologist today at 2pm   Past Medical History:  Diagnosis Date  . Arthritis   . 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 Cancer ([place]) 01-28-2014   skin cancer  . CKD (chronic kidney disease), stage III ([place])   . GERD (gastroesophageal reflux disease)   . Heart murmur    NOTED DURING PHYSICAL WHEN HE WAS ENLISTING IN [place] , DIDNT KNOW UNTIL THAT TIME AND REPORTS , "THATS THE LAST I HEARD ABOUT IT "   . History of hypertension    no longer issue  . History of kidney stones   . History of malignant melanoma of skin    excision top of scalp 2015-- no recurrence  . History of urinary retention    post op lumbar fusion surgery 04/ 2016  . Hypertension   . 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 Left ureteral calculus   . Seasonal allergies   . Wears glasses   . Wears glasses   . Wears partial dentures    upper and lower    Patient Active Problem List   Diagnosis Date Noted  . 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 Port-A-Cath in place 08/19/2018  . Pancytopenia ([place])   . 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 Goals of care, counseling/discussion 08/14/2018  . Other pancytopenia ([place]) 07/25/2018  . Atelectasis   .  Hypoxemia   . Essential hypertension   . Lumbar scoliosis 08/21/2014    Past Surgical History:  Procedure Laterality Date  . ANKLE FUSION Right 2007  . CARPAL TUNNEL RELEASE Left 12/28/2009   w/ pulley release left long finger  . CARPAL TUNNEL RELEASE Right 07/22/2013   Procedure: RIGHT CARPAL TUNNEL RELEASE;  Surgeon: Cammie Sickle., MD;  Location: [place];  Service: Orthopedics;  Laterality: Right;  . COLONOSCOPY    . CYSTO/  LEFT RETROGRADE PYELOGRAM  11/21/2010  . CYSTOSCOPY WITH STENT PLACEMENT Left 03/09/2016   Procedure: CYSTOSCOPY WITH STENT PLACEMENT;  Surgeon: Franchot Gallo, MD;  Location: [facility];  Service: Urology;  Laterality: Left;  . CYSTOSCOPY/RETROGRADE/URETEROSCOPY/STONE EXTRACTION WITH BASKET Left 03/09/2016   Procedure: CYSTOSCOPY/RETROGRADE/URETEROSCOPY/STONE EXTRACTION WITH BASKET;  Surgeon: Franchot Gallo, MD;  Location[facility];  Service: Urology;  Laterality: Left;  . LEFT URETEROSCOPIC LASER LITHOTRIPSY STONE EXTRACTION/ STENT PLACEMENT  05/23/2010  . MOHS SURGERY     TOP OF THE HEAD   . ORIF ANKLE FRACTURE Right 1978  . PORTACATH PLACEMENT Right 08/19/2018   Procedure: INSERTION PORT-A-CATH (attached catheter in right subclavian);  Surgeon: Aviva Signs, MD;  Location: [facility];  Service: General;  Laterality: Right;  . POSTERIOR LUMBAR FUSION  08/21/2014  laminectomy and decompression L2 -- L5  . RIGHT LOWER LEG SURGERY  X3  1975 to 1976   including ORIF  . TONSILLECTOMY AND ADENOIDECTOMY  1986  . UMBILICAL HERNIA REPAIR  2009 approx        Home Medications    Prior to Admission medications   Medication Sig Start Date End Date Taking? Authorizing Provider  acetaminophen (TYLENOL) 500 MG tablet Take 1,000 mg by mouth every 6 (six) hours as needed for mild pain or moderate pain.     [provider]  allopurinol (ZYLOPRIM) 300 MG tablet Take 300 mg by mouth every evening.     [provider]  celecoxib (CELEBREX) 200 MG capsule Take 200 mg by mouth daily. IN THE MORNING    [provider]  cetirizine (ZYRTEC) 10 MG tablet Take 10 mg by mouth daily. IN THE MORNING    [provider]  decitabine 20 mg/m2 in sodium chloride 0.9 % 50 mL Inject 20 mg/m2 into the vein every 28 (twenty-eight) days. Days 1-5 every 28 days    [provider]  diclofenac sodium (VOLTAREN) 1 % GEL Apply 1 g topically 3 (three) times daily as needed (knee pain.).  07/02/18   [provider]  docusate sodium (COLACE) 100 MG capsule Take 100 mg by mouth at bedtime.     [provider]  fluticasone (FLONASE) 50 MCG/ACT nasal spray Place 1 spray into both nostrils daily. In the morning    [provider]  lansoprazole (PREVACID) 15 MG capsule Take 15 mg by mouth daily before breakfast.     [provider]  magnesium oxide (MAG-OX) 400 MG tablet Take 400 mg by mouth at bedtime.     [provider]  Polyethyl Glycol-Propyl Glycol (SYSTANE OP) Place 1 drop into both eyes 2 (two) times daily.    [provider]  prochlorperazine (COMPAZINE) 10 MG tablet Take 1 tablet (10 mg total) by mouth every 6 (six) hours as needed for nausea or vomiting. 01/20/19   [name], MD  tamsulosin (FLOMAX) 0.4 MG CAPS capsule Take 0.4 mg by mouth every evening.     [provider]  traMADol (ULTRAM) 50 MG tablet TAKE 1 TABLET BY MOUTH THREE TIMES DAILY AS NEEDED FOR PAIN 01/27/19   Lockamy, Randi L, NP-C  venetoclax 100 MG TABS Take 400 mg by mouth daily. 01/14/19   [name], MD    Family History Family History  Problem Relation Age of Onset  . Stroke Mother   . Prostate cancer Father   . Bone cancer Father   . Rheum arthritis Sister   . Urinary tract infection Sister     Social History Social History   Tobacco Use  . Smoking status: Former Smoker    Years: 20.00    Types: Cigarettes    Quit  date: 07/17/1986    Years since quitting: 32.5  . Smokeless tobacco: Never Used  Substance Use Topics  . Alcohol use: Yes    Alcohol/week: 7.0 - 14.0 standard drinks    Types: 7 - 14 Cans of beer per week    Comment: 1 -2 beer daily  . Drug use: No     Allergies   Patient has no known allergies.   Review of Systems Review of Systems  Gastrointestinal: Positive for anal bleeding and hematochezia. Negative for abdominal pain.  All other systems reviewed and are negative.    Physical Exam Updated Vital Signs BP 134/67  Pulse 75   Resp 18   Ht 5' 9.5" (1.765 m)   Wt 103.9 kg   SpO2 96%   BMI 33.33 kg/m   Physical Exam Vitals signs and nursing note reviewed.  Constitutional:      Appearance: Normal appearance. He is well-developed.  HENT:     Head: Normocephalic and atraumatic.  Eyes:     Conjunctiva/sclera: Conjunctivae normal.  Neck:     Musculoskeletal: Neck supple.  Cardiovascular:     Rate and Rhythm: Normal rate and regular rhythm.     Heart sounds: No murmur.  Pulmonary:     Effort: Pulmonary effort is normal. No respiratory distress.     Breath sounds: Normal breath sounds.  Abdominal:     Palpations: Abdomen is soft.     Tenderness: There is no abdominal tenderness.  Genitourinary:    Comments: Pea sized external hemmorhoid tracks inward, dried  clot on ouside.  No current bleeding.   Musculoskeletal: Normal range of motion.  Skin:    General: Skin is warm and dry.  Neurological:     General: No focal deficit present.     Mental Status: He is alert.  Psychiatric:        Mood and Affect: Mood normal.      ED Treatments / Results  Labs (all labs ordered are listed, but only abnormal results are displayed) Labs Reviewed  CBC WITH DIFFERENTIAL/PLATELET - Abnormal; Notable for the following components:      Result Value   WBC 0.5 (*)    RBC 2.64 (*)    Hemoglobin 8.0 (*)    HCT 27.6 (*)    MCV 104.5 (*)    MCHC 29.0 (*)    RDW 19.2 (*)     Platelets 9 (*)    nRBC 13.0 (*)    Neutro Abs 0.1 (*)    Lymphs Abs 0.4 (*)    Monocytes Absolute 0.0 (*)    All other components within normal limits  COMPREHENSIVE METABOLIC PANEL - Abnormal; Notable for the following components:   BUN 26 (*)    Calcium 8.8 (*)    Total Protein 5.8 (*)    Albumin 3.4 (*)    Total Bilirubin 1.4 (*)    GFR calc non Af Amer 59 (*)    All other components within normal limits  SARS CORONAVIRUS 2 (HOSPITAL ORDER, [place] [facility])    EKG None  Radiology No results found.  Procedures Procedures (including critical care time)  Medications Ordered in ED Medications - No data to display   Initial Impression / Assessment and Plan / ED Course  I have reviewed the triage vital signs and the nursing notes.  Pertinent labs & imaging results that were available during my care of the patient were reviewed by me and considered in my medical decision making (see chart for details).        MDM  Platelets 9  WBC is 0.5. Hemoglobin 8.  Pt was last transfused 7 days ago.   I spoke to Dr. Delton Coombes.  He advised 2 units of platelets,  May also need to transfuse one until packed red blood cells if hemoglobin drops.   He advised stop Venetoclax. Hospitalist consulted to admit.  Dr. Manuella Ghazi will see here, Final Clinical Impressions(s) / ED Diagnoses   Final diagnoses:  Rectal bleeding  Hemorrhoids, unspecified hemorrhoid type  Thrombocytopenia ([place])  Anemia, unspecified type    ED Discharge Orders  None    An After Visit Summary was printed and given to the patient.    Fransico Meadow, PA-C 02/03/19 1321    Veryl Speak, MD 02/06/19 [phone]

## 2019-02-03 NOTE — ED Notes (Signed)
Consent for blood transfusion obtained 

## 2019-02-03 NOTE — Progress Notes (Signed)
Patient's wife called stating that patient had a bowel movement this morning.  She reports a constant drip of bright red blood coming from his rectum.  I advised her to get him to the emergency room ASAP.  She reports that she will bring him to [facility].    I called and gave report to the charge nurse in the ER.

## 2019-02-03 NOTE — Consult Note (Signed)
Referring Provider: Dr. Manuella Ghazi Primary Care Physician:  [name], MD Primary Gastroenterologist:  Previously unassigned (Dr. Gala Romney)   Date of Admission: 02/03/19 Date of Consultation: 02/03/19  Reason for Consultation: Rectal bleeding   HPI:  Richard Wallace is a 73 y.o. year old male diagnosed with high-grade MDS in March 2020, with repeat bone marrow biopsy in Aug 2020 with findings consistent with AML. On venetoclax daily and completed one cycle of decitabine  Known thrombocytopenia in setting. He received 1 unit PRBCs about a week ago as outpatient. Last platelets received in Aug 2020. Presented with acute onset of painless rectal bleeding, platelets 9, and Hgb 8, similar to baseline.   Remo Lipps, wife,  at bedside. Last colonoscopy in [place] around 9 years ago. No polyps. +hemorrhoids. Several years after colonoscopy went to [facility] and had hemorrhoid banding. New onset rectal bleeding today. No constipation, no straining. No rectal pain or itching. Wife states she looked and saw blood coming from the rectum. No abdominal pain. Celebrex once daily for arthritis. No N/V. Good appetite. No dysphagia. Takes Prevacid once daily although this is not on the list. Reports prolonged sitting on toilet.    Past Medical History:  Diagnosis Date  . Arthritis   . 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 Cancer ([place]) 01-28-2014   skin cancer  . CKD (chronic kidney disease), stage III ([place])   . GERD (gastroesophageal reflux disease)   . Heart murmur    NOTED DURING PHYSICAL WHEN HE WAS ENLISTING IN [place] , DIDNT KNOW UNTIL THAT TIME AND REPORTS , "THATS THE LAST I HEARD ABOUT IT "   . History of hypertension    no longer issue  . History of kidney stones   . History of malignant melanoma of skin    excision top of scalp 2015-- no recurrence  . History of urinary retention    post op lumbar fusion surgery 04/ 2016  . Hypertension   . 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 Left ureteral calculus   . Seasonal allergies   . Wears glasses   . Wears glasses   . Wears partial dentures    upper and lower    Past Surgical History:  Procedure Laterality Date  . ANKLE FUSION Right 2007  . CARPAL TUNNEL RELEASE Left 12/28/2009   w/ pulley release left long finger  . CARPAL TUNNEL RELEASE Right 07/22/2013   Procedure: RIGHT CARPAL TUNNEL RELEASE;  Surgeon: Cammie Sickle., MD;  Location: [place];  Service: Orthopedics;  Laterality: Right;  . COLONOSCOPY    . CYSTO/  LEFT RETROGRADE PYELOGRAM  11/21/2010  . CYSTOSCOPY WITH STENT PLACEMENT Left 03/09/2016   Procedure: CYSTOSCOPY WITH STENT PLACEMENT;  Surgeon: Franchot Gallo, MD;  Location: [facility];  Service: Urology;  Laterality: Left;  . CYSTOSCOPY/RETROGRADE/URETEROSCOPY/STONE EXTRACTION WITH BASKET Left 03/09/2016   Procedure: CYSTOSCOPY/RETROGRADE/URETEROSCOPY/STONE EXTRACTION WITH BASKET;  Surgeon: Franchot Gallo, MD;  Location: [facility];  Service: Urology;  Laterality: Left;  . LEFT URETEROSCOPIC LASER LITHOTRIPSY STONE EXTRACTION/ STENT PLACEMENT  05/23/2010  . MOHS SURGERY     TOP OF THE HEAD   . ORIF ANKLE FRACTURE Right 1978  . PORTACATH PLACEMENT Right 08/19/2018   Procedure: INSERTION PORT-A-CATH (attached catheter in right subclavian);  Surgeon: Aviva Signs, MD;  Location: [facility];  Service: General;  Laterality: Right;  .  POSTERIOR LUMBAR FUSION  08/21/2014   laminectomy and decompression L2 -- L5  . RIGHT LOWER LEG SURGERY  X3  1975 to 1976   including ORIF  . TONSILLECTOMY AND ADENOIDECTOMY  1986  . UMBILICAL HERNIA REPAIR  2009 approx    Prior to Admission medications   Medication Sig Start Date End Date Taking? Authorizing Provider  acetaminophen (TYLENOL) 500 MG tablet Take 1,000 mg by mouth every 6 (six) hours as needed for mild pain or moderate pain.    Yes  [provider]  allopurinol (ZYLOPRIM) 300 MG tablet Take 300 mg by mouth every evening.   Yes [provider]  celecoxib (CELEBREX) 200 MG capsule Take 200 mg by mouth daily. IN THE MORNING   Yes [provider]  cetirizine (ZYRTEC) 10 MG tablet Take 10 mg by mouth daily. IN THE MORNING   Yes [provider]  diclofenac sodium (VOLTAREN) 1 % GEL Apply 1 g topically 3 (three) times daily as needed (knee pain.).  07/02/18  Yes [provider]  docusate sodium (COLACE) 100 MG capsule Take 100 mg by mouth at bedtime.    Yes [provider]  magnesium oxide (MAG-OX) 400 MG tablet Take 400 mg by mouth at bedtime.    Yes [provider]  traMADol (ULTRAM) 50 MG tablet TAKE 1 TABLET BY MOUTH THREE TIMES DAILY AS NEEDED FOR PAIN 01/27/19  Yes Lockamy, Randi L, NP-C  venetoclax 100 MG TABS Take 400 mg by mouth daily. 01/14/19  Yes [name], MD    Current Facility-Administered Medications  Medication Dose Route Frequency Provider Last Rate Last Dose  . 0.9 %  sodium chloride infusion (Manually program via Guardrails IV Fluids)   Intravenous Once Fransico Meadow, [place]       Current Outpatient Medications  Medication Sig Dispense Refill  . acetaminophen (TYLENOL) 500 MG tablet Take 1,000 mg by mouth every 6 (six) hours as needed for mild pain or moderate pain.     Marland Kitchen allopurinol (ZYLOPRIM) 300 MG tablet Take 300 mg by mouth every evening.    . celecoxib (CELEBREX) 200 MG capsule Take 200 mg by mouth daily. IN THE MORNING    . cetirizine (ZYRTEC) 10 MG tablet Take 10 mg by mouth daily. IN THE MORNING    . diclofenac sodium (VOLTAREN) 1 % GEL Apply 1 g topically 3 (three) times daily as needed (knee pain.).     Marland Kitchen docusate sodium (COLACE) 100 MG capsule Take 100 mg by mouth at bedtime.     . magnesium oxide (MAG-OX) 400 MG tablet Take 400 mg by mouth at bedtime.     . traMADol (ULTRAM) 50 MG tablet TAKE 1 TABLET BY MOUTH THREE TIMES  DAILY AS NEEDED FOR PAIN 90 tablet 0  . venetoclax 100 MG TABS Take 400 mg by mouth daily. 120 tablet 0    Allergies as of 02/03/2019  . (No Known Allergies)    Family History  Problem Relation Age of Onset  . Stroke Mother   . Prostate cancer Father   . Bone cancer Father   . Rheum arthritis Sister   . Urinary tract infection Sister     Social History   Socioeconomic History  . Marital status: Married    Spouse name: Not on file  . Number of children: Not on file  . Years of education: Not on file  . Highest education level: Not on file  Occupational History  . Not on file  Social Needs  . Financial resource strain: Not on file  . Food insecurity    Worry: Not on file    Inability: Not on file  . Transportation needs    Medical: Not on file    Non-medical: Not on file  Tobacco Use  . Smoking status: Former Smoker    Years: 20.00    Types: Cigarettes    Quit date: 07/17/1986    Years since quitting: 32.5  . Smokeless tobacco: Never Used  Substance and Sexual Activity  . Alcohol use: Yes    Alcohol/week: 7.0 - 14.0 standard drinks    Types: 7 - 14 Cans of beer per week    Comment: 1 -2 beer daily  . Drug use: No  . Sexual activity: Not on file  Lifestyle  . Physical activity    Days per week: Not on file    Minutes per session: Not on file  . Stress: Not on file  Relationships  . Social Herbalist on phone: Not on file    Gets together: Not on file    Attends religious service: Not on file    Active member of club or organization: Not on file    Attends meetings of clubs or organizations: Not on file    Relationship status: Not on file  . Intimate partner violence    Fear of current or ex partner: Not on file    Emotionally abused: Not on file    Physically abused: Not on file    Forced sexual activity: Not on file  Other Topics Concern  . Not on file  Social History Narrative  . Not on file    Review of Systems: Gen: Denies fever,  chills, loss of appetite, change in weight or weight loss CV: Denies chest pain, heart palpitations, syncope, edema  Resp: +DOE GI: see HPI GU : Denies urinary burning, urinary frequency, urinary incontinence.  MS: Denies joint pain,swelling, cramping Derm: Denies rash, itching, dry skin Psych: Denies depression, anxiety,confusion, or memory loss Heme: see HPI  Physical Exam: Vital signs in last 24 hours: Pulse Rate:  [74-85] 75 (09/28 1300) Resp:  [17-18] 18 (09/28 1130) BP: (120-141)/(67-76) 120/74 (09/28 1300) SpO2:  [96 %-98 %] 96 % (09/28 1300) Weight:  [103.9 kg] 103.9 kg (09/28 1017)   General:   Alert,  Well-developed, pale, pleasant and cooperative in NAD Head:  Normocephalic and atraumatic. Eyes:  Sclera clear, no icterus.   Ears:  Normal auditory acuity. Nose:  No deformity, discharge,  or lesions. Lungs:  Clear throughout to auscultation.   Heart:  S1 S2 present without murmurs Abdomen:  Soft, nontender and nondistended. No masses, hepatosplenomegaly or hernias noted. Normal bowel sounds, without guarding, and without rebound.   Rectal:  External exam with non-thrombosed external hemorrhoids, no gross blood Msk:  Symmetrical without gross deformities. Normal posture. Extremities:  With right ankle edema, chronic Neurologic:  Alert and  oriented x4;  grossly normal neurologically. Psych:  Alert and cooperative.   Intake/Output from previous day: No intake/output data recorded. Intake/Output this shift: No intake/output data recorded.  Lab Results: Recent Labs    02/03/19 1115  WBC 0.5*  HGB 8.0*  HCT 27.6*  PLT 9*   BMET Recent Labs    02/03/19 1115  NA 139  K 4.3  CL 108  CO2 22  GLUCOSE 93  BUN 26*  CREATININE 1.20  CALCIUM 8.8*   LFT Recent Labs    02/03/19 1115  PROT 5.8*  ALBUMIN 3.4*  AST 20  ALT 21  ALKPHOS 50  BILITOT 1.4*     Impression: Pleasant 73 year old male recently diagnosed with AML and followed by Dr. Delton Coombes,  presenting with acute onset painless rectal bleeding in setting of profound thrombocytopenia. Platelets 9, requiring outpatient infusions of both PRBCs and platelets previously. Last colonoscopy around 9 years ago in [place] per patient. Not an endoscopic candidate at this moment due to severe thrombocytopenia, but colonoscopy should be pursued once platelets in acceptable range. Doubt dealing with rapid transit UGI bleed. We did discuss avoiding Celebrex; he denies any other NSAIDs. Continue with supportive measures, transfuse as needed. Will reassess in the morning.   Plan: Continue supportive measures, transfusions Avoidance of NSAIDs Will reassess in the morning    Annitta Needs, PhD, ANP-BC [facility]     LOS: 0 days    02/03/2019, 2:12 PM

## 2019-02-04 ENCOUNTER — Telehealth: Payer: Self-pay | Admitting: Gastroenterology

## 2019-02-04 DIAGNOSIS — K625 Hemorrhage of anus and rectum: Secondary | ICD-10-CM | POA: Diagnosis not present

## 2019-02-04 LAB — PREPARE PLATELET PHERESIS
Unit division: 0
Unit division: 0

## 2019-02-04 LAB — CBC
HCT: 29.2 % — ABNORMAL LOW (ref 39.0–52.0)
Hemoglobin: 8.5 g/dL — ABNORMAL LOW (ref 13.0–17.0)
MCH: 30.4 pg (ref 26.0–34.0)
MCHC: 29.1 g/dL — ABNORMAL LOW (ref 30.0–36.0)
MCV: 104.3 fL — ABNORMAL HIGH (ref 80.0–100.0)
Platelets: 32 10*3/uL — ABNORMAL LOW (ref 150–400)
RBC: 2.8 MIL/uL — ABNORMAL LOW (ref 4.22–5.81)
RDW: 19.2 % — ABNORMAL HIGH (ref 11.5–15.5)
WBC: 0.6 10*3/uL — CL (ref 4.0–10.5)
nRBC: 10.9 % — ABNORMAL HIGH (ref 0.0–0.2)

## 2019-02-04 LAB — BASIC METABOLIC PANEL
Anion gap: 10 (ref 5–15)
BUN: 19 mg/dL (ref 8–23)
CO2: 24 mmol/L (ref 22–32)
Calcium: 9.2 mg/dL (ref 8.9–10.3)
Chloride: 107 mmol/L (ref 98–111)
Creatinine, Ser: 1 mg/dL (ref 0.61–1.24)
GFR calc Af Amer: >60 mL/min (ref >60)
GFR calc non Af Amer: >60 mL/min (ref >60)
Glucose, Bld: 89 mg/dL (ref 70–99)
Potassium: 4.7 mmol/L (ref 3.5–5.1)
Sodium: 141 mmol/L (ref 135–145)

## 2019-02-04 LAB — BPAM PLATELET PHERESIS
Blood Product Expiration Date: 202009282359
Blood Product Expiration Date: 202009292045
ISSUE DATE / TIME: 202009281704
ISSUE DATE / TIME: 202009282114
Unit Type and Rh: 7300
Unit Type and Rh: 8400

## 2019-02-04 MED ORDER — SODIUM CHLORIDE 0.9% IV SOLUTION: Freq: Once | INTRAVENOUS | Status: DC

## 2019-02-04 NOTE — Telephone Encounter (Signed)
Please arrange hospital follow-up due to rectal bleeding.

## 2019-02-04 NOTE — Telephone Encounter (Signed)
PATIENT SCHEDULED  °

## 2019-02-04 NOTE — Discharge Summary (Signed)
Physician Discharge Summary  Richard Wallace [id number] DOB: Nov 16, 1945 DOA: 02/03/2019  PCP: [name], MD  Admit date: 02/03/2019  Discharge date: 02/04/2019  Admitted From:Home  Disposition:  Home  Recommendations for Outpatient Follow-up:  1. Follow up with PCP in 1-2 weeks 2. Follow-up with Dr. Delton Coombes in the next 2 days with appointment that will be scheduled by the end of the week to recheck CBC. 3. Remain off Celebrex and venetoclax for the time being 4. Follow-up with GI as scheduled in the near future for colonoscopy  Home Health: None  Equipment/Devices: None  Discharge Condition: Stable  CODE STATUS: Full  Diet recommendation: Heart Healthy  Brief/Interim Summary: Per HPI: Richard Wallace is a 73 y.o. 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  He denies any associated abdominal pain, nausea, vomiting, or diarrhea.  He does struggle with constipation intermittently.  He states his last colonoscopy was about 9 years ago.  He does have internal and external hemorrhoids.  He does not complain of any lightheadedness, dizziness, chest pain, or shortness of breath.  No fevers or chills.  Patient is not currently on any blood thinners.  Patient was admitted with rectal bleeding secondary to severe thrombocytopenia with platelet count of 9000.  He has undergone 2 unit platelet transfusion with count of 32,000 noted this morning.  He has had no further rectal bleeding or other adverse events overnight.  He continues to have stable hemoglobin levels.  Discussed case with his oncologist Dr. Delton Coombes who recommends 1 more unit of platelets at this time and will follow-up closely in the next 1 to 2 days for repeat CBC check.  He will need to avoid Celebrex as well as venetoclax  until further follow-up.  Patient will receive 1 more unit of platelets and then be stable for discharge.  Discharge Diagnoses:  Active Problems:   Rectal bleeding  Principal discharge diagnosis: Hemorrhoidal rectal bleeding secondary to severe thrombocytopenia.  Discharge Instructions  Discharge Instructions    Diet - low sodium heart healthy   Complete by: As directed    Increase activity slowly   Complete by: As directed      Allergies as of 02/04/2019   No Known Allergies     Medication List    STOP taking these medications   CeleBREX 200 MG capsule Generic drug: celecoxib   venetoclax 100 MG Tabs     TAKE these medications   acetaminophen 500 MG tablet Commonly known as: TYLENOL Take 1,000 mg by mouth every 6 (six) hours as needed for mild pain or moderate pain.   allopurinol 300 MG tablet Commonly known as: ZYLOPRIM Take 300 mg by mouth every evening.   cetirizine 10 MG tablet Commonly known as: ZYRTEC Take 10 mg by mouth daily. IN THE MORNING   diclofenac sodium 1 % Gel Commonly known as: VOLTAREN Apply 1 g topically 3 (three) times daily as needed (knee pain.).   docusate sodium 100 MG capsule Commonly known as: COLACE Take 100 mg by mouth at bedtime.   magnesium oxide 400 MG tablet Commonly known as: MAG-OX Take 400 mg by mouth at bedtime.   traMADol 50 MG tablet Commonly known as: ULTRAM TAKE 1 TABLET BY MOUTH THREE TIMES DAILY AS NEEDED FOR PAIN      Follow-up Information  [name], MD Follow up in 1 week(s).   Specialty: Internal Medicine Contact information: [place] [address] [phone]        [name], MD. Schedule an appointment as soon as possible for a visit.   Specialty: Hematology Contact information: [place] [address] [phone]          No Known Allergies  Consultations:  GI  Oncology on phone   Procedures/Studies:  No results  found.   Discharge Exam: Vitals:   02/03/19 2343 02/04/19 0747  BP: (!) 149/80   Pulse: 86   Resp: 18   Temp: 99.7 F (37.6 C) 99.2 F (37.3 C)  SpO2: 99%    Vitals:   02/03/19 2119 02/03/19 2134 02/03/19 2343 02/04/19 0747  BP: (!) 150/90 (!) 165/89 (!) 149/80   Pulse: 87 81 86   Resp: 16 16 18    Temp: 99 F (37.2 C) 98.7 F (37.1 C) 99.7 F (37.6 C) 99.2 F (37.3 C)  TempSrc: Oral Oral Oral Oral  SpO2: 98% 98% 99%   Weight:      Height:        General: Pt is alert, awake, not in acute distress Cardiovascular: RRR, S1/S2 +, no rubs, no gallops Respiratory: CTA bilaterally, no wheezing, no rhonchi Abdominal: Soft, NT, ND, bowel sounds + Extremities: no edema, no cyanosis    The results of significant diagnostics from this hospitalization (including imaging, microbiology, ancillary and laboratory) are listed below for reference.     Microbiology: Recent Results (from the past 240 hour(s))  SARS Coronavirus 2 [facility] order, Performed in [facility] hospital lab) Nasopharyngeal Nasopharyngeal Swab     Status: None   Collection Time: 02/03/19 12:31 PM   Specimen: Nasopharyngeal Swab  Result Value Ref Range Status   SARS Coronavirus 2 NEGATIVE NEGATIVE Final    Comment: (NOTE) If result is NEGATIVE SARS-CoV-2 target nucleic acids are NOT DETECTED. The SARS-CoV-2 RNA is generally detectable in upper and lower  respiratory specimens during the acute phase of infection. The lowest  concentration of SARS-CoV-2 viral copies this assay can detect is 250  copies / mL. A negative result does not preclude SARS-CoV-2 infection  and should not be used as the sole basis for treatment or other  patient management decisions.  A negative result may occur with  improper specimen collection / handling, submission of specimen other  than nasopharyngeal swab, presence of viral mutation(s) within the  areas targeted by this assay, and inadequate number of viral copies  (<250  copies / mL). A negative result must be combined with clinical  observations, patient history, and epidemiological information. If result is POSITIVE SARS-CoV-2 target nucleic acids are DETECTED. The SARS-CoV-2 RNA is generally detectable in upper and lower  respiratory specimens dur ing the acute phase of infection.  Positive  results are indicative of active infection with SARS-CoV-2.  Clinical  correlation with patient history and other diagnostic information is  necessary to determine patient infection status.  Positive results do  not rule out bacterial infection or co-infection with other viruses. If result is PRESUMPTIVE POSTIVE SARS-CoV-2 nucleic acids MAY BE PRESENT.   A presumptive positive result was obtained on the submitted specimen  and confirmed on repeat testing.  While 2019 novel coronavirus  (SARS-CoV-2) nucleic acids may be present in the submitted sample  additional confirmatory testing may be necessary for epidemiological  and / or clinical management purposes  to differentiate between  SARS-CoV-2  and other Sarbecovirus currently known to infect humans.  If clinically indicated additional testing with an alternate test  methodology [id number]) is advised. The SARS-CoV-2 RNA is generally  detectable in upper and lower respiratory sp ecimens during the acute  phase of infection. The expected result is Negative. Fact Sheet for Patients:  StrictlyIdeas.no Fact Sheet for Healthcare Providers: BankingDealers.co.za This test is not yet approved or cleared by the [place] FDA and has been authorized for detection and/or diagnosis of SARS-CoV-2 by FDA under an Emergency Use Authorization (EUA).  This EUA will remain in effect (meaning this test can be used) for the duration of the COVID-19 declaration under Section 564(b)(1) of the Act, 21 U.S.C. section 360bbb-3(b)(1), unless the authorization is terminated or revoked  sooner. Performed at [facility], [address] [place], [place] [address]   MRSA PCR Screening     Status: None   Collection Time: 02/03/19  3:42 PM   Specimen: Nasal Mucosa; Nasopharyngeal  Result Value Ref Range Status   MRSA by PCR NEGATIVE NEGATIVE Final    Comment:        The GeneXpert MRSA Assay (FDA approved for NASAL specimens only), is one component of a comprehensive MRSA colonization surveillance program. It is not intended to diagnose MRSA infection nor to guide or monitor treatment for MRSA infections. Performed at [facility], [address], [place], [place] [address]      Labs: BNP (last 3 results) No results for input(s): BNP in the last 8760 hours. Basic Metabolic Panel: Recent Labs  Lab 02/03/19 1115 02/04/19 0408  NA 139 141  K 4.3 4.7  CL 108 107  CO2 22 24  GLUCOSE 93 89  BUN 26* 19  CREATININE 1.20 1.00  CALCIUM 8.8* 9.2   Liver Function Tests: Recent Labs  Lab 02/03/19 1115  AST 20  ALT 21  ALKPHOS 50  BILITOT 1.4*  PROT 5.8*  ALBUMIN 3.4*   No results for input(s): LIPASE, AMYLASE in the last 168 hours. No results for input(s): AMMONIA in the last 168 hours. CBC: Recent Labs  Lab 02/03/19 1115 02/04/19 0408  WBC 0.5* 0.6*  NEUTROABS 0.1*  --   HGB 8.0* 8.5*  HCT 27.6* 29.2*  MCV 104.5* 104.3*  PLT 9* 32*   Cardiac Enzymes: No results for input(s): CKTOTAL, CKMB, CKMBINDEX, TROPONINI in the last 168 hours. BNP: Invalid input(s): POCBNP CBG: No results for input(s): GLUCAP in the last 168 hours. D-Dimer No results for input(s): DDIMER in the last 72 hours. Hgb A1c No results for input(s): HGBA1C in the last 72 hours. Lipid Profile No results for input(s): CHOL, HDL, LDLCALC, TRIG, CHOLHDL, LDLDIRECT in the last 72 hours. Thyroid function studies No results for input(s): TSH, T4TOTAL, T3FREE, THYROIDAB in the last 72 hours.  Invalid input(s): FREET3 Anemia work up No results for input(s): VITAMINB12, FOLATE,  FERRITIN, TIBC, IRON, RETICCTPCT in the last 72 hours. Urinalysis    Component Value Date/Time   COLORURINE AMBER (A) 12/19/2018 1204   APPEARANCEUR HAZY (A) 12/19/2018 1204   LABSPEC 1.026 12/19/2018 1204   PHURINE 5.0 12/19/2018 1204   GLUCOSEU NEGATIVE 12/19/2018 1204   HGBUR LARGE (A) 12/19/2018 1204   BILIRUBINUR NEGATIVE 12/19/2018 1204   KETONESUR NEGATIVE 12/19/2018 1204   PROTEINUR 30 (A) 12/19/2018 1204   UROBILINOGEN 1.0 08/24/2014 1630   NITRITE NEGATIVE 12/19/2018 1204   LEUKOCYTESUR NEGATIVE 12/19/2018 1204   Sepsis Labs Invalid input(s): PROCALCITONIN,  WBC,  LACTICIDVEN Microbiology Recent Results (from the  past 240 hour(s))  SARS Coronavirus 2 [facility] order, Performed in [facility] hospital lab) Nasopharyngeal Nasopharyngeal Swab     Status: None   Collection Time: 02/03/19 12:31 PM   Specimen: Nasopharyngeal Swab  Result Value Ref Range Status   SARS Coronavirus 2 NEGATIVE NEGATIVE Final    Comment: (NOTE) If result is NEGATIVE SARS-CoV-2 target nucleic acids are NOT DETECTED. The SARS-CoV-2 RNA is generally detectable in upper and lower  respiratory specimens during the acute phase of infection. The lowest  concentration of SARS-CoV-2 viral copies this assay can detect is 250  copies / mL. A negative result does not preclude SARS-CoV-2 infection  and should not be used as the sole basis for treatment or other  patient management decisions.  A negative result may occur with  improper specimen collection / handling, submission of specimen other  than nasopharyngeal swab, presence of viral mutation(s) within the  areas targeted by this assay, and inadequate number of viral copies  (<250 copies / mL). A negative result must be combined with clinical  observations, patient history, and epidemiological information. If result is POSITIVE SARS-CoV-2 target nucleic acids are DETECTED. The SARS-CoV-2 RNA is generally detectable in upper and lower  respiratory  specimens dur ing the acute phase of infection.  Positive  results are indicative of active infection with SARS-CoV-2.  Clinical  correlation with patient history and other diagnostic information is  necessary to determine patient infection status.  Positive results do  not rule out bacterial infection or co-infection with other viruses. If result is PRESUMPTIVE POSTIVE SARS-CoV-2 nucleic acids MAY BE PRESENT.   A presumptive positive result was obtained on the submitted specimen  and confirmed on repeat testing.  While 2019 novel coronavirus  (SARS-CoV-2) nucleic acids may be present in the submitted sample  additional confirmatory testing may be necessary for epidemiological  and / or clinical management purposes  to differentiate between  SARS-CoV-2 and other Sarbecovirus currently known to infect humans.  If clinically indicated additional testing with an alternate test  methodology [phone]) is advised. The SARS-CoV-2 RNA is generally  detectable in upper and lower respiratory sp ecimens during the acute  phase of infection. The expected result is Negative. Fact Sheet for Patients:  StrictlyIdeas.no Fact Sheet for Healthcare Providers: BankingDealers.co.za This test is not yet approved or cleared by the [place] FDA and has been authorized for detection and/or diagnosis of SARS-CoV-2 by FDA under an Emergency Use Authorization (EUA).  This EUA will remain in effect (meaning this test can be used) for the duration of the COVID-19 declaration under Section 564(b)(1) of the Act, 21 U.S.C. section 360bbb-3(b)(1), unless the authorization is terminated or revoked sooner. Performed at [facility], [address], [place], [place] [address]   MRSA PCR Screening     Status: None   Collection Time: 02/03/19  3:42 PM   Specimen: Nasal Mucosa; Nasopharyngeal  Result Value Ref Range Status   MRSA by PCR NEGATIVE NEGATIVE Final     Comment:        The GeneXpert MRSA Assay (FDA approved for NASAL specimens only), is one component of a comprehensive MRSA colonization surveillance program. It is not intended to diagnose MRSA infection nor to guide or monitor treatment for MRSA infections. Performed at [facility], [address] [place], [place] [address]      Time coordinating discharge: 35 minutes  SIGNED:   Rodena Goldmann, DO [facility] 02/04/2019, 10:14 AM  If 7PM-7AM, please contact night-coverage  [email or web address] Password [email or web address]

## 2019-02-04 NOTE — Plan of Care (Signed)

## 2019-02-04 NOTE — Progress Notes (Addendum)
    Subjective: No further rectal bleeding. No abdominal pain. No N/V. Tolerating diet. Agreeable to colonoscopy in future and wants Dr. Delton Coombes to be aware.   Objective: Vital signs in last 24 hours: Temp:  [98.1 F (36.7 C)-99.7 F (37.6 C)] 99.2 F (37.3 C) (09/29 0747) Pulse Rate:  [74-92] 86 (09/28 2343) Resp:  [16-18] 18 (09/28 2343) BP: (120-165)/(67-90) 149/80 (09/28 2343) SpO2:  [96 %-99 %] 99 % (09/28 2343) Weight:  [100.2 kg-103.9 kg] 100.2 kg (09/28 1548) Last BM Date: 02/03/19 General:   Alert and oriented, pleasant Abdomen:  Bowel sounds present, soft, non-tender, non-distended. No HSM or hernias noted. No rebound or guarding.  Extremities:  Without edema. Neurologic:  Alert and  oriented x4  Intake/Output from previous day: 09/28 0701 - 09/29 0700 In: 1539 [I.V.:250; Blood:1289] Out: -  Intake/Output this shift: Total I/O In: -  Out: 150 [Urine:150]  Lab Results: Recent Labs    02/03/19 1115 02/04/19 0408  WBC 0.5* 0.6*  HGB 8.0* 8.5*  HCT 27.6* 29.2*  PLT 9* 32*   BMET Recent Labs    02/03/19 1115 02/04/19 0408  NA 139 141  K 4.3 4.7  CL 108 107  CO2 22 24  GLUCOSE 93 89  BUN 26* 19  CREATININE 1.20 1.00  CALCIUM 8.8* 9.2   LFT Recent Labs    02/03/19 1115  PROT 5.8*  ALBUMIN 3.4*  AST 20  ALT 21  ALKPHOS 50  BILITOT 1.4*    Assessment: Pleasant 73 year old male recently diagnosed with AML and followed by Dr. Delton Coombes, presenting with acute onset painless rectal bleeding in setting of profound thrombocytopenia, known pancytopenia. Platelets 9, requiring outpatient infusions of both PRBCs and platelets previously. Last colonoscopy around 9 years ago in [place] per patient. Received 2 units platelets yesterday. Improvement in platelets to 32. Would benefit from colonoscopy in future once platelets stabilized.    Plan: Continue supportive measures Colonoscopy in future, likely as outpatient Will arrange outpatient follow-up   Avoid NSAIDs Continue with PPI as outpatient as previously has been doing (Prevacid)   Annitta Needs, PhD, ANP-BC [facility]    LOS: 0 days    02/04/2019, 8:29 AM

## 2019-02-04 NOTE — Progress Notes (Signed)
CRITICAL VALUE ALERT  Critical Value:  WBC 0.6  Platelets 32  Date & Time Notied:  0608  02-04-19  Provider Notified: Opyd  Orders Received/Actions taken: pending new orders

## 2019-02-05 LAB — BPAM PLATELET PHERESIS
Blood Product Expiration Date: 202010012359
ISSUE DATE / TIME: 202009291140
Unit Type and Rh: 5100

## 2019-02-05 LAB — PREPARE PLATELET PHERESIS: Unit division: 0

## 2019-02-09 ENCOUNTER — Encounter (HOSPITAL_COMMUNITY): Payer: Self-pay

## 2019-02-09 ENCOUNTER — Other Ambulatory Visit: Payer: Self-pay

## 2019-02-09 ENCOUNTER — Emergency Department (HOSPITAL_COMMUNITY): Payer: Medicare Other

## 2019-02-09 ENCOUNTER — Inpatient Hospital Stay (HOSPITAL_COMMUNITY)
Admission: EM | Admit: 2019-02-09 | Discharge: 2019-02-18 | DRG: 854 | Disposition: A | Discharge status: Home Health | Payer: Medicare Other | Attending: Internal Medicine | Admitting: Internal Medicine

## 2019-02-09 DIAGNOSIS — R7881 Bacteremia: Secondary | ICD-10-CM

## 2019-02-09 DIAGNOSIS — Z8042 Family history of malignant neoplasm of prostate: Secondary | ICD-10-CM | POA: Diagnosis not present

## 2019-02-09 DIAGNOSIS — D701 Agranulocytosis secondary to cancer chemotherapy: Secondary | ICD-10-CM | POA: Diagnosis present

## 2019-02-09 DIAGNOSIS — I129 Hypertensive chronic kidney disease with stage 1 through stage 4 chronic kidney disease, or unspecified chronic kidney disease: Secondary | ICD-10-CM | POA: Diagnosis present

## 2019-02-09 DIAGNOSIS — J310 Chronic rhinitis: Secondary | ICD-10-CM | POA: Diagnosis present

## 2019-02-09 DIAGNOSIS — D696 Thrombocytopenia, unspecified: Secondary | ICD-10-CM

## 2019-02-09 DIAGNOSIS — R509 Fever, unspecified: Secondary | ICD-10-CM

## 2019-02-09 DIAGNOSIS — Z87442 Personal history of urinary calculi: Secondary | ICD-10-CM

## 2019-02-09 DIAGNOSIS — Z981 Arthrodesis status: Secondary | ICD-10-CM | POA: Diagnosis not present

## 2019-02-09 DIAGNOSIS — D6959 Other secondary thrombocytopenia: Secondary | ICD-10-CM | POA: Diagnosis present

## 2019-02-09 DIAGNOSIS — E860 Dehydration: Secondary | ICD-10-CM | POA: Diagnosis present

## 2019-02-09 DIAGNOSIS — Z8261 Family history of arthritis: Secondary | ICD-10-CM

## 2019-02-09 DIAGNOSIS — K1230 Oral mucositis (ulcerative), unspecified: Secondary | ICD-10-CM | POA: Diagnosis present

## 2019-02-09 DIAGNOSIS — B955 Unspecified streptococcus as the cause of diseases classified elsewhere: Secondary | ICD-10-CM | POA: Diagnosis not present

## 2019-02-09 DIAGNOSIS — T451X5A Adverse effect of antineoplastic and immunosuppressive drugs, initial encounter: Secondary | ICD-10-CM | POA: Diagnosis present

## 2019-02-09 DIAGNOSIS — E663 Overweight: Secondary | ICD-10-CM | POA: Diagnosis present

## 2019-02-09 DIAGNOSIS — R5081 Fever presenting with conditions classified elsewhere: Secondary | ICD-10-CM | POA: Diagnosis present

## 2019-02-09 DIAGNOSIS — I10 Essential (primary) hypertension: Secondary | ICD-10-CM | POA: Diagnosis not present

## 2019-02-09 DIAGNOSIS — A408 Other streptococcal sepsis: Secondary | ICD-10-CM | POA: Diagnosis present

## 2019-02-09 DIAGNOSIS — B954 Other streptococcus as the cause of diseases classified elsewhere: Secondary | ICD-10-CM | POA: Diagnosis present

## 2019-02-09 DIAGNOSIS — Z87891 Personal history of nicotine dependence: Secondary | ICD-10-CM | POA: Diagnosis not present

## 2019-02-09 DIAGNOSIS — K921 Melena: Secondary | ICD-10-CM | POA: Diagnosis present

## 2019-02-09 DIAGNOSIS — N183 Chronic kidney disease, stage 3 unspecified: Secondary | ICD-10-CM | POA: Diagnosis present

## 2019-02-09 DIAGNOSIS — Z6832 Body mass index (BMI) 32.0-32.9, adult: Secondary | ICD-10-CM | POA: Diagnosis not present

## 2019-02-09 DIAGNOSIS — B957 Other staphylococcus as the cause of diseases classified elsewhere: Secondary | ICD-10-CM | POA: Diagnosis present

## 2019-02-09 DIAGNOSIS — Z823 Family history of stroke: Secondary | ICD-10-CM

## 2019-02-09 DIAGNOSIS — J309 Allergic rhinitis, unspecified: Secondary | ICD-10-CM | POA: Diagnosis not present

## 2019-02-09 DIAGNOSIS — R8271 Bacteriuria: Secondary | ICD-10-CM | POA: Diagnosis present

## 2019-02-09 DIAGNOSIS — Z79899 Other long term (current) drug therapy: Secondary | ICD-10-CM

## 2019-02-09 DIAGNOSIS — J449 Chronic obstructive pulmonary disease, unspecified: Secondary | ICD-10-CM | POA: Diagnosis present

## 2019-02-09 DIAGNOSIS — Z95828 Presence of other vascular implants and grafts: Secondary | ICD-10-CM

## 2019-02-09 DIAGNOSIS — D709 Neutropenia, unspecified: Secondary | ICD-10-CM | POA: Diagnosis present

## 2019-02-09 DIAGNOSIS — D469 Myelodysplastic syndrome, unspecified: Secondary | ICD-10-CM | POA: Diagnosis present

## 2019-02-09 DIAGNOSIS — Z85820 Personal history of malignant melanoma of skin: Secondary | ICD-10-CM

## 2019-02-09 DIAGNOSIS — R918 Other nonspecific abnormal finding of lung field: Secondary | ICD-10-CM | POA: Diagnosis present

## 2019-02-09 DIAGNOSIS — C9200 Acute myeloblastic leukemia, not having achieved remission: Secondary | ICD-10-CM | POA: Diagnosis present

## 2019-02-09 DIAGNOSIS — R531 Weakness: Secondary | ICD-10-CM

## 2019-02-09 DIAGNOSIS — Z20828 Contact with and (suspected) exposure to other viral communicable diseases: Secondary | ICD-10-CM | POA: Diagnosis present

## 2019-02-09 LAB — COMPREHENSIVE METABOLIC PANEL
ALT: 24 U/L (ref 0–44)
AST: 21 U/L (ref 15–41)
Albumin: 3.9 g/dL (ref 3.5–5.0)
Alkaline Phosphatase: 55 U/L (ref 38–126)
Anion gap: 11 (ref 5–15)
BUN: 28 mg/dL — ABNORMAL HIGH (ref 8–23)
CO2: 23 mmol/L (ref 22–32)
Calcium: 9.1 mg/dL (ref 8.9–10.3)
Chloride: 102 mmol/L (ref 98–111)
Creatinine, Ser: 1.32 mg/dL — ABNORMAL HIGH (ref 0.61–1.24)
GFR calc Af Amer: >60 mL/min (ref >60)
GFR calc non Af Amer: 53 mL/min — ABNORMAL LOW (ref >60)
Glucose, Bld: 116 mg/dL — ABNORMAL HIGH (ref 70–99)
Potassium: 4.1 mmol/L (ref 3.5–5.1)
Sodium: 136 mmol/L (ref 135–145)
Total Bilirubin: 3.1 mg/dL — ABNORMAL HIGH (ref 0.3–1.2)
Total Protein: 7 g/dL (ref 6.5–8.1)

## 2019-02-09 LAB — PROTIME-INR
INR: 1.1 (ref 0.8–1.2)
Prothrombin Time: 14.3 seconds (ref 11.4–15.2)

## 2019-02-09 LAB — CBC WITH DIFFERENTIAL/PLATELET
Abs Immature Granulocytes: 0 10*3/uL (ref 0.00–0.07)
Basophils Absolute: 0 10*3/uL (ref 0.0–0.1)
Basophils Relative: 0 %
Eosinophils Absolute: 0 10*3/uL (ref 0.0–0.5)
Eosinophils Relative: 0 %
HCT: 31.9 % — ABNORMAL LOW (ref 39.0–52.0)
Hemoglobin: 8.8 g/dL — ABNORMAL LOW (ref 13.0–17.0)
Immature Granulocytes: 0 %
Lymphocytes Relative: 95 %
Lymphs Abs: 0.4 10*3/uL — ABNORMAL LOW (ref 0.7–4.0)
MCH: 30 pg (ref 26.0–34.0)
MCHC: 27.6 g/dL — ABNORMAL LOW (ref 30.0–36.0)
MCV: 108.9 fL — ABNORMAL HIGH (ref 80.0–100.0)
Monocytes Absolute: 0 10*3/uL — ABNORMAL LOW (ref 0.1–1.0)
Monocytes Relative: 0 %
Neutro Abs: 0 10*3/uL — ABNORMAL LOW (ref 1.7–7.7)
Neutrophils Relative %: 5 %
Platelets: 7 10*3/uL — CL (ref 150–400)
RBC: 2.93 MIL/uL — ABNORMAL LOW (ref 4.22–5.81)
RDW: 19.7 % — ABNORMAL HIGH (ref 11.5–15.5)
WBC: 0.4 10*3/uL — CL (ref 4.0–10.5)
nRBC: 0 % (ref 0.0–0.2)

## 2019-02-09 LAB — LACTIC ACID, PLASMA
Lactic Acid, Venous: 1.7 mmol/L (ref 0.5–1.9)
Lactic Acid, Venous: 1 mmol/L (ref 0.5–1.9)

## 2019-02-09 LAB — URINALYSIS, ROUTINE W REFLEX MICROSCOPIC
Bilirubin Urine: NEGATIVE
Glucose, UA: NEGATIVE mg/dL
Ketones, ur: NEGATIVE mg/dL
Leukocytes,Ua: NEGATIVE
Nitrite: NEGATIVE
Protein, ur: 100 mg/dL — AB
Specific Gravity, Urine: 1.024 (ref 1.005–1.030)
pH: 5 (ref 5.0–8.0)

## 2019-02-09 LAB — SARS CORONAVIRUS 2 BY RT PCR (HOSPITAL ORDER, PERFORMED IN ~~LOC~~ HOSPITAL LAB): SARS Coronavirus 2: NEGATIVE

## 2019-02-09 LAB — APTT: aPTT: 33 seconds (ref 24–36)

## 2019-02-09 MED ORDER — TRAMADOL HCL 50 MG PO TABS: 50.0000 mg | ORAL_TABLET | Freq: Three times a day (TID) | ORAL | Status: DC | PRN

## 2019-02-09 MED ORDER — MAGNESIUM OXIDE 400 (241.3 MG) MG PO TABS
400.0000 mg | ORAL_TABLET | Freq: Every day | ORAL | Status: DC
  Administered 2019-02-09 – 2019-02-17 (×9): 400 mg via ORAL
  Filled 2019-02-09 (×12): qty 1

## 2019-02-09 MED ORDER — DOCUSATE SODIUM 100 MG PO CAPS
100.0000 mg | ORAL_CAPSULE | Freq: Every day | ORAL | Status: DC
  Administered 2019-02-09 – 2019-02-17 (×7): 100 mg via ORAL
  Filled 2019-02-09 (×8): qty 1

## 2019-02-09 MED ORDER — SODIUM CHLORIDE 0.9 % IV BOLUS
1000.0000 mL | Freq: Once | INTRAVENOUS | Status: AC
  Administered 2019-02-09: 1000 mL via INTRAVENOUS

## 2019-02-09 MED ORDER — VANCOMYCIN HCL IN DEXTROSE 1-5 GM/200ML-% IV SOLN
1000.0000 mg | Freq: Once | INTRAVENOUS | Status: AC
  Administered 2019-02-09: 1000 mg via INTRAVENOUS
  Filled 2019-02-09: qty 200

## 2019-02-09 MED ORDER — VANCOMYCIN HCL 10 G IV SOLR: 1500.0000 mg | Freq: Once | INTRAVENOUS | Status: DC

## 2019-02-09 MED ORDER — CHLORHEXIDINE GLUCONATE CLOTH 2 % EX PADS
6.0000 | MEDICATED_PAD | Freq: Every day | CUTANEOUS | Status: DC
  Administered 2019-02-10 – 2019-02-12 (×3): 6 via TOPICAL

## 2019-02-09 MED ORDER — SODIUM CHLORIDE 0.9 % IV SOLN
2.0000 g | Freq: Three times a day (TID) | INTRAVENOUS | Status: DC
  Administered 2019-02-09 – 2019-02-10 (×2): 2 g via INTRAVENOUS
  Filled 2019-02-09 (×2): qty 2

## 2019-02-09 MED ORDER — DEXTROSE-NACL 5-0.45 % IV SOLN
INTRAVENOUS | Status: AC
  Administered 2019-02-09: 17:00:00 via INTRAVENOUS

## 2019-02-09 MED ORDER — DICLOFENAC SODIUM 1 % TD GEL
2.0000 g | Freq: Three times a day (TID) | TRANSDERMAL | Status: DC | PRN
  Filled 2019-02-09: qty 100

## 2019-02-09 MED ORDER — ACETAMINOPHEN 500 MG PO TABS
1000.0000 mg | ORAL_TABLET | Freq: Once | ORAL | Status: AC
  Administered 2019-02-09: 1000 mg via ORAL
  Filled 2019-02-09: qty 2

## 2019-02-09 MED ORDER — SODIUM CHLORIDE 0.9 % IV SOLN
2.0000 g | Freq: Once | INTRAVENOUS | Status: AC
  Administered 2019-02-09: 2 g via INTRAVENOUS
  Filled 2019-02-09: qty 2

## 2019-02-09 MED ORDER — ACETAMINOPHEN 500 MG PO TABS
1000.0000 mg | ORAL_TABLET | Freq: Four times a day (QID) | ORAL | Status: DC | PRN
  Administered 2019-02-09 – 2019-02-10 (×4): 1000 mg via ORAL
  Filled 2019-02-09 (×4): qty 2

## 2019-02-09 MED ORDER — VANCOMYCIN HCL 10 G IV SOLR: 1500.0000 mg | INTRAVENOUS | Status: DC

## 2019-02-09 MED ORDER — ALLOPURINOL 300 MG PO TABS
300.0000 mg | ORAL_TABLET | Freq: Every evening | ORAL | Status: DC
  Administered 2019-02-09 – 2019-02-17 (×9): 300 mg via ORAL
  Filled 2019-02-09 (×10): qty 1

## 2019-02-09 MED ORDER — SODIUM CHLORIDE 0.9 % IV SOLN: 10.0000 mL/h | Freq: Once | INTRAVENOUS | Status: DC

## 2019-02-09 MED ORDER — LORATADINE 10 MG PO TABS
10.0000 mg | ORAL_TABLET | Freq: Every day | ORAL | Status: DC
  Administered 2019-02-09 – 2019-02-18 (×9): 10 mg via ORAL
  Filled 2019-02-09 (×11): qty 1

## 2019-02-09 NOTE — ED Notes (Signed)
ED TO INPATIENT HANDOFF REPORT  ED Nurse Name and Phone #: [phone]  S Name/Age/Gender Richard Wallace 73 y.o. male Room/Bed: APA10/APA10  Code Status   Code Status: Full Code  Home/SNF/Other Home Patient oriented to: self, place, time and situation Is this baseline? 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 Pt reports that he has been running a fever and has a slight cough. Fever x 2 days. Dr Raliegh Ip wanted pt to come in to be seen. Pt also has  A sorethroat   Allergies No Known Allergies  Level of Care/Admitting Diagnosis ED Disposition    ED Disposition Condition [facility] Area: [place] [id number]  Level of Care: Med-Surg [16]  Covid Evaluation: Confirmed COVID Negative  Diagnosis: Fever and neutropenia [facility]) [id number]  Admitting Physician: Neena Rhymes [[id number]]  Attending Physician: Adella Hare E [[id number]]  Estimated length of stay: past midnight tomorrow  Certification:: I certify this patient will need inpatient services for at least 2 midnights  PT Class (Do Not Modify): Inpatient [101]  PT Acc Code (Do Not Modify): Private [1]       B Medical/Surgery History Past Medical History:  Diagnosis Date  . Arthritis   . 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 Cancer ([place]) 01-28-2014   skin cancer  . CKD (chronic kidney disease), stage III ([place])   . GERD (gastroesophageal reflux disease)   . Heart murmur    NOTED DURING PHYSICAL WHEN HE WAS ENLISTING IN [facility] , DIDNT KNOW UNTIL THAT TIME AND REPORTS , "THATS THE LAST I HEARD ABOUT IT "   . History of hypertension    no longer issue  . History of kidney stones   . History of malignant melanoma of skin    excision top of scalp 2015-- no recurrence  . History of urinary retention    post op lumbar fusion surgery 04/ 2016  . Hypertension   . 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 Left ureteral calculus   . Seasonal allergies   . Wears glasses   . Wears glasses   . Wears partial dentures    upper and lower   Past Surgical History:  Procedure Laterality Date  . ANKLE FUSION Right 2007  . CARPAL TUNNEL RELEASE Left 12/28/2009   w/ pulley release left long finger  . CARPAL TUNNEL RELEASE Right 07/22/2013   Procedure: RIGHT CARPAL TUNNEL RELEASE;  Surgeon: Cammie Sickle., MD;  Location: [facility];  Service: Orthopedics;  Laterality: Right;  . COLONOSCOPY    . CYSTO/  LEFT RETROGRADE PYELOGRAM  11/21/2010  . CYSTOSCOPY WITH STENT PLACEMENT Left 03/09/2016   Procedure: CYSTOSCOPY WITH STENT PLACEMENT;  Surgeon: Franchot Gallo, MD;  [facility];  Service: Urology;  Laterality: Left;  . CYSTOSCOPY/RETROGRADE/URETEROSCOPY/STONE EXTRACTION WITH BASKET Left 03/09/2016   Procedure: CYSTOSCOPY/RETROGRADE/URETEROSCOPY/STONE EXTRACTION WITH BASKET;  Surgeon: Franchot Gallo, MD;  Location: [facility];  Service: Urology;  Laterality: Left;  . LEFT URETEROSCOPIC LASER LITHOTRIPSY STONE EXTRACTION/ STENT PLACEMENT  05/23/2010  . MOHS SURGERY     TOP OF THE HEAD   . ORIF ANKLE FRACTURE Right 1978  . PORTACATH PLACEMENT Right 08/19/2018   Procedure: INSERTION PORT-A-CATH (attached catheter in right subclavian);  Surgeon: Aviva Signs,  MD;  Location: [facility];  Service: General;  Laterality: Right;  . POSTERIOR LUMBAR FUSION  08/21/2014   laminectomy and decompression L2 -- L5  . RIGHT LOWER LEG SURGERY  X3  1975 to 1976   including ORIF  . TONSILLECTOMY AND ADENOIDECTOMY  1986  . UMBILICAL HERNIA REPAIR  2009 approx     A IV Location/Drains/Wounds Patient Lines/Drains/Airways Status   Active Line/Drains/Airways    Name:   Placement date:   Placement time:   Site:   Days:   Implanted Port 08/19/18 Right Chest   08/19/18    0901    Chest   174    Implanted Port 02/09/19 Right Chest   02/09/19    1410    Chest   less than 1   Peripheral IV 02/09/19 Right Antecubital   02/09/19    1332    Antecubital   less than 1   Incision (Closed) 08/19/18 Chest Right   08/19/18    [id number]     174          Intake/Output Last 24 hours  Intake/Output Summary (Last 24 hours) at 02/09/2019 1530 Last data filed at 02/09/2019 1435 Gross per 24 hour  Intake 100 ml  Output -  Net 100 ml    Labs/Imaging Results for orders placed or performed during the hospital encounter of 02/09/19 (from the past 48 hour(s))  Lactic acid, plasma     Status: None   Collection Time: 02/09/19  1:30 PM  Result Value Ref Range   Lactic Acid, Venous 1.7 0.5 - 1.9 mmol/L    Comment: Performed at [facility], [address], [place], [place] [address]  Comprehensive metabolic panel     Status: Abnormal   Collection Time: 02/09/19  1:30 PM  Result Value Ref Range   Sodium 136 135 - 145 mmol/L   Potassium 4.1 3.5 - 5.1 mmol/L   Chloride 102 98 - 111 mmol/L   CO2 23 22 - 32 mmol/L   Glucose, Bld 116 (H) 70 - 99 mg/dL   BUN 28 (H) 8 - 23 mg/dL   Creatinine, Ser 1.32 (H) 0.61 - 1.24 mg/dL   Calcium 9.1 8.9 - 10.3 mg/dL   Total Protein 7.0 6.5 - 8.1 g/dL   Albumin 3.9 3.5 - 5.0 g/dL   AST 21 15 - 41 U/L   ALT 24 0 - 44 U/L   Alkaline Phosphatase 55 38 - 126 U/L   Total Bilirubin 3.1 (H) 0.3 - 1.2 mg/dL   GFR calc non Af Amer 53 (L) >60 mL/min   GFR calc Af Amer >60 >60 mL/min   Anion gap 11 5 - 15    Comment: Performed at [facility], [address], [place],  [address]  CBC WITH DIFFERENTIAL     Status: Abnormal   Collection Time: 02/09/19  1:30 PM  Result Value Ref Range   WBC 0.4 (LL) 4.0 - 10.5 K/uL    Comment: REPEATED TO VERIFY THIS CRITICAL RESULT HAS VERIFIED AND BEEN CALLED TO FARMER, E BY ED AGUNDIZ ON 10 04 2020 AT 1405, AND HAS BEEN READ BACK. CALLED IN PRIOR TO RESULTING AT 1358 BY L. HENDERSON    RBC 2.93 (L) 4.22 - 5.81 MIL/uL   Hemoglobin  8.8 (L) 13.0 - 17.0 g/dL   HCT 31.9 (L) 39.0 - 52.0 %   MCV 108.9 (H) 80.0 - 100.0 fL   MCH 30.0 26.0 - 34.0 pg   MCHC 27.6 (L)  30.0 - 36.0 g/dL   RDW 19.7 (H) 11.5 - 15.5 %   Platelets 7 (LL) 150 - 400 K/uL    Comment: REPEATED TO VERIFY THIS CRITICAL RESULT HAS VERIFIED AND BEEN CALLED TO FARMER, E BY ED AGUNDIZ ON 10 04 2020 AT 1405, AND HAS BEEN READ BACK. CALLED IN PRIOR TO RESULTING AT 1358 BY L. HENDERSON    nRBC 0.0 0.0 - 0.2 %   Neutrophils Relative % 5 %   Neutro Abs 0.0 (L) 1.7 - 7.7 K/uL   Lymphocytes Relative 95 %   Lymphs Abs 0.4 (L) 0.7 - 4.0 K/uL   Monocytes Relative 0 %   Monocytes Absolute 0.0 (L) 0.1 - 1.0 K/uL   Eosinophils Relative 0 %   Eosinophils Absolute 0.0 0.0 - 0.5 K/uL   Basophils Relative 0 %   Basophils Absolute 0.0 0.0 - 0.1 K/uL   Immature Granulocytes 0 %   Abs Immature Granulocytes 0.00 0.00 - 0.07 K/uL    Comment: Performed at [facility], [address] [place], [place] [address]  APTT     Status: None   Collection Time: 02/09/19  1:30 PM  Result Value Ref Range   aPTT 33 24 - 36 seconds    Comment: Performed at [facility], [address] [place], [place] [address]  Protime-INR     Status: None   Collection Time: 02/09/19  1:30 PM  Result Value Ref Range   Prothrombin Time 14.3 11.4 - 15.2 seconds   INR 1.1 0.8 - 1.2    Comment: (NOTE) INR goal varies based on device and disease states. Performed at [facility], [address], [place], [place] [address]   Blood Culture (routine x 2)     Status: None (Preliminary result)   Collection Time: 02/09/19  1:30 PM   Specimen: Right Antecubital; Blood  Result Value Ref Range   Specimen Description RIGHT ANTECUBITAL    Special Requests      BOTTLES DRAWN AEROBIC AND ANAEROBIC Blood Culture results may not be optimal due to an excessive volume of blood received in culture bottles Performed at [facility], [address] [place], [place] [address]    Culture PENDING    Report Status  PENDING   Blood Culture (routine x 2)     Status: None (Preliminary result)   Collection Time: 02/09/19  1:33 PM   Specimen: BLOOD RIGHT HAND  Result Value Ref Range   Specimen Description BLOOD RIGHT HAND    Special Requests      BOTTLES DRAWN AEROBIC AND ANAEROBIC Blood Culture adequate volume Performed at [facility], [address], [place], [place] [address]    Culture PENDING    Report Status PENDING   SARS Coronavirus 2 [facility] order, Performed in [place] hospital lab) Nasopharyngeal Nasopharyngeal Swab     Status: None   Collection Time: 02/09/19  2:00 PM   Specimen: Nasopharyngeal Swab  Result Value Ref Range   SARS Coronavirus 2 NEGATIVE NEGATIVE    Comment: (NOTE) If result is NEGATIVE SARS-CoV-2 target nucleic acids are NOT DETECTED. The SARS-CoV-2 RNA is generally detectable in upper and lower  respiratory specimens during the acute phase of infection. The lowest  concentration of SARS-CoV-2 viral copies this assay can detect is 250  copies / mL. A negative result does not preclude SARS-CoV-2 infection  and should not be used as the sole basis for treatment or other  patient management decisions.  A negative result may occur with  improper specimen collection / handling, submission of specimen other  than nasopharyngeal swab, presence of viral mutation(s) within the  areas targeted by this assay, and inadequate number of viral copies  (<250 copies / mL). A negative result must be combined with clinical  observations, patient history, and epidemiological information. If result is POSITIVE SARS-CoV-2 target nucleic acids are DETECTED. The SARS-CoV-2 RNA is generally detectable in upper and lower  respiratory specimens dur ing the acute phase of infection.  Positive  results are indicative of active infection with SARS-CoV-2.  Clinical  correlation with patient history and other diagnostic information is  necessary to determine patient infection status.  Positive  results do  not rule out bacterial infection or co-infection with other viruses. If result is PRESUMPTIVE POSTIVE SARS-CoV-2 nucleic acids MAY BE PRESENT.   A presumptive positive result was obtained on the submitted specimen  and confirmed on repeat testing.  While 2019 novel coronavirus  (SARS-CoV-2) nucleic acids may be present in the submitted sample  additional confirmatory testing may be necessary for epidemiological  and / or clinical management purposes  to differentiate between  SARS-CoV-2 and other Sarbecovirus currently known to infect humans.  If clinically indicated additional testing with an alternate test  methodology [phone]) is advised. The SARS-CoV-2 RNA is generally  detectable in upper and lower respiratory sp ecimens during the acute  phase of infection. The expected result is Negative. Fact Sheet for Patients:  StrictlyIdeas.no Fact Sheet for Healthcare Providers: BankingDealers.co.za This test is not yet approved or cleared by the [place] FDA and has been authorized for detection and/or diagnosis of SARS-CoV-2 by FDA under an Emergency Use Authorization (EUA).  This EUA will remain in effect (meaning this test can be used) for the duration of the COVID-19 declaration under Section 564(b)(1) of the Act, 21 U.S.C. section 360bbb-3(b)(1), unless the authorization is terminated or revoked sooner. Performed at [facility], [address], [place], [place] [address]    Dg Chest Port 1 View  Result Date: 02/09/2019 CLINICAL DATA:  Fever and cough. Patient has leukemia receiving chemotherapy. EXAM: PORTABLE CHEST 1 VIEW COMPARISON:  08/28/2018 FINDINGS: Right subclavian Port-A-Cath unchanged. Lungs are adequately inflated without focal airspace consolidation or effusion. Cardiomediastinal silhouette and remainder the exam is unchanged. IMPRESSION: No active disease. Electronically Signed   By: Marin Olp M.D.   On:  02/09/2019 14:26    Pending Labs Unresulted Labs (From admission, onward)    Start     Ordered   02/10/19 0500  CBC  Tomorrow morning,   R     02/09/19 1525   02/10/19 [id number]  Basic metabolic panel  Tomorrow morning,   R     02/09/19 1525   02/09/19 1405  Prepare Pheresed Platelets  (Adult Blood Adminstration - Platelets (Pheresed))  Once,   R    Question Answer Comment  Number of Apheresis Units 2 units (12-20 packs)   Transfusion Indications PLT Count </=10,000/mm   Date and time of surgery n/a      02/09/19 1406   02/09/19 1312  Lactic acid, plasma  Now then every 2 hours,   STAT     02/09/19 1311   02/09/19 1312  Urinalysis, Routine w reflex microscopic  ONCE - STAT,   STAT     02/09/19 1311   02/09/19 1312  Urine culture  ONCE - STAT,   STAT    Question:  Patient immune status  Answer:  Normal   02/09/19 1311  02/09/19 1312  Group A Strep by PCR  ONCE - STAT,   STAT    Question:  Patient immune status  Answer:  Normal   02/09/19 1311          Vitals/Pain Today's Vitals   02/09/19 1416 02/09/19 1430 02/09/19 1445 02/09/19 1500  BP:  (!) 160/86  (!) 154/86  Pulse:  (!) 121  (!) 122  Resp:    (!) 29  Temp: (!) 101.7 F (38.7 C)  (!) 103.1 F (39.5 C)   TempSrc: Oral  Oral   SpO2:  93%  93%  Weight:      Height:      PainSc:        Isolation Precautions Droplet and Contact precautions  Medications Medications  0.9 %  sodium chloride infusion (has no administration in time range)  acetaminophen (TYLENOL) tablet 1,000 mg (has no administration in time range)  allopurinol (ZYLOPRIM) tablet 300 mg (has no administration in time range)  traMADol (ULTRAM) tablet 50 mg (has no administration in time range)  docusate sodium (COLACE) capsule 100 mg (has no administration in time range)  magnesium oxide (MAG-OX) tablet 400 mg (has no administration in time range)  loratadine (CLARITIN) tablet 10 mg (has no administration in time range)  diclofenac sodium (VOLTAREN) 1  % transdermal gel 2 g (has no administration in time range)  dextrose 5 %-0.45 % sodium chloride infusion (has no administration in time range)  sodium chloride 0.9 % bolus 1,000 mL (1,000 mLs Intravenous New Bag/Given 02/09/19 1337)  vancomycin (VANCOCIN) IVPB 1000 mg/200 mL premix (1,000 mg Intravenous New Bag/Given 02/09/19 1413)  ceFEPIme (MAXIPIME) 2 g in sodium chloride 0.9 % 100 mL IVPB (0 g Intravenous Stopped 02/09/19 1435)  sodium chloride 0.9 % bolus 1,000 mL (1,000 mLs Intravenous New Bag/Given 02/09/19 1413)  sodium chloride 0.9 % bolus 1,000 mL (1,000 mLs Intravenous New Bag/Given 02/09/19 1413)  acetaminophen (TYLENOL) tablet 1,000 mg (1,000 mg Oral Given 02/09/19 1453)    Mobility walks Low fall risk   Focused Assessments   R Recommendations: See Admitting Provider Note  Report given to:   Additional Notes:

## 2019-02-09 NOTE — ED Notes (Signed)
Pt refusing strep screen

## 2019-02-09 NOTE — ED Notes (Signed)
EKG leads reapplied.

## 2019-02-09 NOTE — Progress Notes (Signed)
Pharmacy Antibiotic Note  Richard Wallace is a 73 y.o. male admitted on 02/09/2019 with febrile neutropenia.  Pharmacy has been consulted for Vancomycin and Cefepime dosing.  Plan: Vancomycin 2000 mg IV x 1 dose. Vancomycin 1500 mg IV every 24 hours.  Goal trough 15-20 mcg/mL.  Cefepime 2000 mg IV every 8 hours. Monitor labs, c/s, and vanco level as indicated.  Height: 5' 9.5" (176.5 cm) Weight: 225 lb (102.1 kg) IBW/kg (Calculated) : 71.85  Temp (24hrs), Avg:102.5 F (39.2 C), Min:100.2 F (37.9 C), Max:103.6 F (39.8 C)  Recent Labs  Lab 02/03/19 1115 02/04/19 0408 02/09/19 1330 02/09/19 1528  WBC 0.5* 0.6* 0.4*  --   CREATININE 1.20 1.00 1.32*  --   LATICACIDVEN  --   --  1.7 1.0    Estimated Creatinine Clearance: 58.3 mL/min (A) (by C-G formula based on SCr of 1.32 mg/dL (H)).    No Known Allergies  Antimicrobials this admission: Cefepime 10/4 >>  Vanco 10/4 >>   Dose adjustments this admission: N/A  Microbiology results: 10/4 BCx: pending 10/4 UCx: pending  10/4 CDiff: pending  Thank you for allowing pharmacy to be a part of this patient's care.  Ramond Craver 02/09/2019 7:37 PM

## 2019-02-09 NOTE — ED Notes (Signed)
CRITICAL VALUE ALERT  Critical Value:  WBC 0.4 Platelets 7   Date & Time Notied:  02/09/2019 1405   Provider Notified: Evalee Jefferson PA   Orders Received/Actions taken: None yet

## 2019-02-09 NOTE — ED Provider Notes (Addendum)
[place] [facility] EMERGENCY DEPARTMENT Provider Note   CSN: [id number] Arrival date & time: 02/09/19  1152     History   Chief Complaint Chief Complaint  Patient presents with  . Fever    HPI Richard Wallace is a 73 y.o. male with a history significant for active chemotherapy for AML, last tx 2 weeks ago, CKD, GERD, HTN, atrophy of kidney and dc from here 5 days ago where he was admitted for rectal bleeding and severe thrombocytopenia (9000) presenting with fever to 104 since last night, mild nonproductive cough (denies sob) and sore throat.  He endorses nausea but this is a chronic finding, not new. Denies chest pain, headache, abdominal pain, dysuria.  He denies unusual bruising or bleeding. No further rectal bleeding since the day of dc from here 9/29.  He reports feeling profoundly weak without dizziness, no focal weakness.      The history is provided by the patient.  Fever Associated symptoms: cough, nausea and sore throat   Associated symptoms: no chest pain, no congestion, no diarrhea, no headaches, no rash and no vomiting     Past Medical History:  Diagnosis Date  . Arthritis   . 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 Cancer ([place]) 01-28-2014   skin cancer  . CKD (chronic kidney disease), stage III ([place])   . GERD (gastroesophageal reflux disease)   . Heart murmur    NOTED DURING PHYSICAL WHEN HE WAS ENLISTING IN MILITARY , DIDNT KNOW UNTIL THAT TIME AND REPORTS , "THATS THE LAST I HEARD ABOUT IT "   . History of hypertension    no longer issue  . History of kidney stones   . History of malignant melanoma of skin    excision top of scalp 2015-- no recurrence  . History of urinary retention    post op lumbar fusion surgery 04/ 2016  . Hypertension   . 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 Left ureteral calculus   . Seasonal allergies   . Wears glasses   . Wears glasses   . Wears partial  dentures    upper and lower    Patient Active Problem List   Diagnosis Date Noted  . Rectal bleeding 02/03/2019  . 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 Port-A-Cath in place 08/19/2018  . Pancytopenia ([place])   . MDS (myelodysplastic syndrome), high grade ([place]) 08/14/2018  . Goals of care, counseling/discussion 08/14/2018  . Other pancytopenia ([place]) 07/25/2018  . Atelectasis   . Hypoxemia   . Essential hypertension   . Lumbar scoliosis 08/21/2014    Past Surgical History:  Procedure Laterality Date  . ANKLE FUSION Right 2007  . CARPAL TUNNEL RELEASE Left 12/28/2009   w/ pulley release left long finger  . CARPAL TUNNEL RELEASE Right 07/22/2013   Procedure: RIGHT CARPAL TUNNEL RELEASE;  Surgeon: Cammie Sickle., MD;  Location: [place];  Service: Orthopedics;  Laterality: Right;  . COLONOSCOPY    . CYSTO/  LEFT RETROGRADE PYELOGRAM  11/21/2010  . CYSTOSCOPY WITH STENT PLACEMENT Left 03/09/2016   Procedure: CYSTOSCOPY WITH STENT PLACEMENT;  Surgeon: Franchot Gallo, MD;  [facility];  Service: Urology;  Laterality: Left;  . CYSTOSCOPY/RETROGRADE/URETEROSCOPY/STONE EXTRACTION WITH BASKET Left 03/09/2016   Procedure: CYSTOSCOPY/RETROGRADE/URETEROSCOPY/STONE EXTRACTION WITH BASKET;  Surgeon: Franchot Gallo, MD;  Location: [facility];  Service: Urology;  Laterality: Left;  .  LEFT URETEROSCOPIC LASER LITHOTRIPSY STONE EXTRACTION/ STENT PLACEMENT  05/23/2010  . MOHS SURGERY     TOP OF THE HEAD   . ORIF ANKLE FRACTURE Right 1978  . PORTACATH PLACEMENT Right 08/19/2018   Procedure: INSERTION PORT-A-CATH (attached catheter in right subclavian);  Surgeon: Aviva Signs, MD;  [facility];  Service: General;  Laterality: Right;  . POSTERIOR LUMBAR FUSION  08/21/2014   laminectomy and decompression L2 -- L5  . RIGHT LOWER LEG SURGERY  X3  1975 to 1976   including ORIF  . TONSILLECTOMY AND ADENOIDECTOMY  1986   . UMBILICAL HERNIA REPAIR  2009 approx        Home Medications    Prior to Admission medications   Medication Sig Start Date End Date Taking? Authorizing Provider  acetaminophen (TYLENOL) 500 MG tablet Take 1,000 mg by mouth every 6 (six) hours as needed for mild pain or moderate pain.    Yes [provider]  allopurinol (ZYLOPRIM) 300 MG tablet Take 300 mg by mouth every evening.   Yes [provider]  cetirizine (ZYRTEC) 10 MG tablet Take 10 mg by mouth daily. IN THE MORNING   Yes [provider]  diclofenac sodium (VOLTAREN) 1 % GEL Apply 1 g topically 3 (three) times daily as needed (knee pain.).  07/02/18  Yes [provider]  docusate sodium (COLACE) 100 MG capsule Take 100 mg by mouth at bedtime.    Yes [provider]  magnesium oxide (MAG-OX) 400 MG tablet Take 400 mg by mouth at bedtime.    Yes [provider]  traMADol (ULTRAM) 50 MG tablet TAKE 1 TABLET BY MOUTH THREE TIMES DAILY AS NEEDED FOR PAIN 01/27/19  Yes Lockamy, Theresia Lo, NP-C    Family History Family History  Problem Relation Age of Onset  . Stroke Mother   . Prostate cancer Father   . Bone cancer Father   . Diverticulitis Father   . Rheum arthritis Sister   . Urinary tract infection Sister   . Colon cancer Neg Hx     Social History Social History   Tobacco Use  . Smoking status: Former Smoker    Years: 20.00    Types: Cigarettes    Quit date: 07/17/1986    Years since quitting: 32.5  . Smokeless tobacco: Never Used  Substance Use Topics  . Alcohol use: Yes    Alcohol/week: 7.0 - 14.0 standard drinks    Types: 7 - 14 Cans of beer per week    Comment: 1 -2 beer daily  . Drug use: No     Allergies   Patient has no known allergies.   Review of Systems Review of Systems  Constitutional: Positive for fever.  HENT: Positive for sore throat. Negative for congestion.   Eyes: Negative.   Respiratory: Positive for cough. Negative for chest  tightness and shortness of breath.   Cardiovascular: Negative for chest pain.  Gastrointestinal: Positive for nausea. Negative for abdominal pain, diarrhea and vomiting.  Genitourinary: Negative.   Musculoskeletal: Negative for arthralgias, joint swelling and neck pain.  Skin: Negative.  Negative for rash and wound.  Neurological: Positive for weakness. Negative for dizziness, light-headedness, numbness and headaches.  Psychiatric/Behavioral: Negative.   All other systems reviewed and are negative.    Physical Exam Updated Vital Signs BP (!) 160/86   Pulse (!) 121   Temp (!) 103.1 F (39.5 C) (Oral)   Resp (!) 30   Ht 5' 9.5" (1.765 m)  Wt 102.1 kg   SpO2 93%   BMI 32.75 kg/m   Physical Exam Vitals signs and nursing note reviewed.  Constitutional:      General: He is not in acute distress.    Appearance: He is well-developed.     Comments: Appears fatigued.  HENT:     Head: Normocephalic and atraumatic.     Mouth/Throat:     Mouth: Mucous membranes are dry.     Pharynx: Posterior oropharyngeal erythema present. No oropharyngeal exudate or uvula swelling.     Tonsils: No tonsillar exudate or tonsillar abscesses.     Comments: White patchy tongue Eyes:     Conjunctiva/sclera: Conjunctivae normal.  Neck:     Musculoskeletal: Normal range of motion.  Cardiovascular:     Rate and Rhythm: Normal rate and regular rhythm.     Heart sounds: Normal heart sounds.  Pulmonary:     Effort: Pulmonary effort is normal.     Breath sounds: Normal breath sounds. No wheezing.  Abdominal:     General: Bowel sounds are normal.     Palpations: Abdomen is soft.     Tenderness: There is no abdominal tenderness.  Musculoskeletal: Normal range of motion.  Skin:    General: Skin is warm and dry.  Neurological:     General: No focal deficit present.     Mental Status: He is alert.      ED Treatments / Results  Labs (all labs ordered are listed, but only abnormal results are  displayed) Labs Reviewed  COMPREHENSIVE METABOLIC PANEL - Abnormal; Notable for the following components:      Result Value   Glucose, Bld 116 (*)    BUN 28 (*)    Creatinine, Ser 1.32 (*)    Total Bilirubin 3.1 (*)    GFR calc non Af Amer 53 (*)    All other components within normal limits  CBC WITH DIFFERENTIAL/PLATELET - Abnormal; Notable for the following components:   WBC 0.4 (*)    RBC 2.93 (*)    Hemoglobin 8.8 (*)    HCT 31.9 (*)    MCV 108.9 (*)    MCHC 27.6 (*)    RDW 19.7 (*)    Platelets 7 (*)    Neutro Abs 0.0 (*)    Lymphs Abs 0.4 (*)    Monocytes Absolute 0.0 (*)    All other components within normal limits  CULTURE, BLOOD (ROUTINE X 2)  CULTURE, BLOOD (ROUTINE X 2)  URINE CULTURE  GROUP A STREP BY PCR  SARS CORONAVIRUS 2 (HOSPITAL ORDER, [place] [facility])  LACTIC ACID, PLASMA  APTT  PROTIME-INR  LACTIC ACID, PLASMA  URINALYSIS, ROUTINE W REFLEX MICROSCOPIC  PREPARE PLATELET PHERESIS    EKG None  Radiology Dg Chest Port 1 View  Result Date: 02/09/2019 CLINICAL DATA:  Fever and cough. Patient has leukemia receiving chemotherapy. EXAM: PORTABLE CHEST 1 VIEW COMPARISON:  08/28/2018 FINDINGS: Right subclavian Port-A-Cath unchanged. Lungs are adequately inflated without focal airspace consolidation or effusion. Cardiomediastinal silhouette and remainder the exam is unchanged. IMPRESSION: No active disease. Electronically Signed   By: Marin Olp M.D.   On: 02/09/2019 14:26    Procedures Procedures (including critical care time)  Medications Ordered in ED Medications  vancomycin (VANCOCIN) IVPB 1000 mg/200 mL premix (1,000 mg Intravenous New Bag/Given 02/09/19 1413)  0.9 %  sodium chloride infusion (has no administration in time range)  sodium chloride 0.9 % bolus 1,000 mL (1,000 mLs Intravenous New  Bag/Given 02/09/19 1337)  ceFEPIme (MAXIPIME) 2 g in sodium chloride 0.9 % 100 mL IVPB (0 g Intravenous Stopped 02/09/19 1435)  sodium  chloride 0.9 % bolus 1,000 mL (1,000 mLs Intravenous New Bag/Given 02/09/19 1413)  sodium chloride 0.9 % bolus 1,000 mL (1,000 mLs Intravenous New Bag/Given 02/09/19 1413)  acetaminophen (TYLENOL) tablet 1,000 mg (1,000 mg Oral Given 02/09/19 1453)     Initial Impression / Assessment and Plan / ED Course  I have reviewed the triage vital signs and the nursing notes.  Pertinent labs & imaging results that were available during my care of the patient were reviewed by me and considered in my medical decision making (see chart for details).      Pt with AML, actively receiving chemotherapy, recent admission for leukopenia and thrombocytopenia with rectal bleeding.  Pt denies bleeding since his last admission. Reports sore throat, mild cough, mostly just profound weakness.  Discussed with Dr. Delton Coombes who requests pt admission to hospitalist service, he will consult in am. Recommends starting vanc and cefepime for broad abx coverage.  Also requests platelet infusion if he is thrombocytopenic again as with last admission.  Labs reviewed and significant for profound thrombocytopenia at 7000, also wbc count 0.4.  Lactate normal range.  Pt is dehydrated, IV fluids given.  Pending covid results, cxr clear, urine pending.    Discussed with Dr. Linda Hedges who will admit.   Final Clinical Impressions(s) / ED Diagnoses   Final diagnoses:  Febrile illness  Leukopenia due to antineoplastic chemotherapy [facility])  Thrombocytopenia [facility])  Weakness    ED Discharge Orders    None       Landis Martins 02/09/19 1513    Milton Ferguson, MD 02/09/19 1514    Evalee Jefferson, PA-C 02/09/19 1534    Milton Ferguson, MD 02/10/19 1506

## 2019-02-09 NOTE — H&P (Signed)
History and Physical    Richard Wallace [id number] DOB: February 16, 1946 DOA: 02/09/2019  PCP: [name], MD (Confirm with patient/family/NH records and if not entered, this has to be entered at [facility] point of entry) Patient coming from: Coming from home  I have personally briefly reviewed patient's old medical records in [place]  Chief Complaint: Fevers and weakness  HPI: Richard Wallace is a 73 y.o. male with medical history significant of active chemotherapy for AML, last tx 2 weeks ago, CKD, GERD, HTN, atrophy of kidney and dc from here 5 days ago where he was admitted for rectal bleeding and severe thrombocytopenia (9000) now presenting with fever to 104 since last night w/o sweats but with rigors, mild nonproductive cough (denies sob) and sore throat.  He endorses nausea but this is a chronic finding, not new. He reports one episodes of diarrhea but has had loose stools. Denies mucus or blood in the stool. Denies chest pain, headache, abdominal pain, dysuria.  He denies unusual bruising or bleeding. No further rectal bleeding since the day of dc from here 9/29.  He reports feeling profoundly weak without dizziness, no focal weakness.   ED Course: Hemodynamically stable in the ED. CXR performed and negative. Rapid Covid 19 testing negative. Lab studies reveal Plt 7,000, WBC 0.8, Cr 1.32 (baseline), INR 1.1. Dr. Delton Coombes consulted who recommends admission for IV abx for FUO in neutropenic patient and repeal plts transfusion x 3 units. [facility] called to admit.  Review of Systems: As per HPI otherwise 10 point review of systems negative.    Past Medical History:  Diagnosis Date  . Arthritis   . 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 Cancer ([place]) 01-28-2014   skin cancer  . CKD (chronic kidney disease), stage III ([place])   . GERD (gastroesophageal reflux disease)   . Heart murmur    NOTED DURING PHYSICAL WHEN HE WAS ENLISTING IN [place]IT[place] , DIDNT KNOW UNTIL THAT TIME AND  REPORTS , "THATS THE LAST I HEARD ABOUT IT "   . History of hypertension    no longer issue  . History of kidney stones   . History of malignant melanoma of skin    excision top of scalp 2015-- no recurrence  . History of urinary retention    post op lumbar fusion surgery 04/ 2016  . Hypertension   . 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 Left ureteral calculus   . Seasonal allergies   . Wears glasses   . Wears glasses   . Wears partial dentures    upper and lower    Past Surgical History:  Procedure Laterality Date  . ANKLE FUSION Right 2007  . CARPAL TUNNEL RELEASE Left 12/28/2009   w/ pulley release left long finger  . CARPAL TUNNEL RELEASE Right 07/22/2013   Procedure: RIGHT CARPAL TUNNEL RELEASE;  Surgeon: Cammie Sickle., MD;  Location: [place];  Service: Orthopedics;  Laterality: Right;  . COLONOSCOPY    . CYSTO/  LEFT RETROGRADE PYELOGRAM  11/21/2010  . CYSTOSCOPY WITH STENT PLACEMENT Left 03/09/2016   Procedure: CYSTOSCOPY WITH STENT PLACEMENT;  Surgeon: Franchot Gallo, MD;  Location[facility];  Service: Urology;  Laterality: Left;  . CYSTOSCOPY/RETROGRADE/URETEROSCOPY/STONE EXTRACTION WITH BASKET Left 03/09/2016   Procedure: CYSTOSCOPY/RETROGRADE/URETEROSCOPY/STONE EXTRACTION WITH BASKET;  Surgeon: Franchot Gallo, MD;  Location: [facility];  Service: Urology;  Laterality: Left;  . LEFT URETEROSCOPIC LASER LITHOTRIPSY STONE EXTRACTION/ STENT PLACEMENT  05/23/2010  . MOHS SURGERY     TOP OF THE HEAD   . ORIF ANKLE FRACTURE Right 1978  . PORTACATH PLACEMENT Right 08/19/2018   Procedure: INSERTION PORT-A-CATH (attached catheter in right subclavian);  Surgeon: Aviva Signs, MD;  Location: [facility];  Service: General;  Laterality: Right;  . POSTERIOR LUMBAR FUSION  08/21/2014   laminectomy and decompression L2 -- L5  . RIGHT LOWER LEG SURGERY  X3   1975 to 1976   including ORIF  . TONSILLECTOMY AND ADENOIDECTOMY  1986  . UMBILICAL HERNIA REPAIR  2009 approx   Soc Hx -  1st marriage 6 years, no kids; 2nd marriage 10 years - no kids; married to 79rd wife for 10 years. She has 3 kids. 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 Lives with his wife and two of her grandchildren.   reports that he quit smoking about 32 years ago. His smoking use included cigarettes. He quit after 20.00 years of use. He has never used smokeless tobacco. He reports current alcohol use of about 7.0 - 14.0 standard drinks of alcohol per week. He reports that he does not use drugs.  No Known Allergies  Family History  Problem Relation Age of Onset  . Stroke Mother   . Prostate cancer Father   . Bone cancer Father   . Diverticulitis Father   . Rheum arthritis Sister   . Urinary tract infection Sister   . Colon cancer Neg Hx       Prior to Admission medications   Medication Sig Start Date End Date Taking? Authorizing Provider  acetaminophen (TYLENOL) 500 MG tablet Take 1,000 mg by mouth every 6 (six) hours as needed for mild pain or moderate pain.    Yes [provider]  allopurinol (ZYLOPRIM) 300 MG tablet Take 300 mg by mouth every evening.   Yes [provider]  cetirizine (ZYRTEC) 10 MG tablet Take 10 mg by mouth daily. IN THE MORNING   Yes [provider]  diclofenac sodium (VOLTAREN) 1 % GEL Apply 1 g topically 3 (three) times daily as needed (knee pain.).  07/02/18  Yes [provider]  docusate sodium (COLACE) 100 MG capsule Take 100 mg by mouth at bedtime.    Yes [provider]  magnesium oxide (MAG-OX) 400 MG tablet Take 400 mg by mouth at bedtime.    Yes [provider]  traMADol (ULTRAM) 50 MG tablet TAKE 1 TABLET BY MOUTH THREE TIMES DAILY AS NEEDED FOR PAIN 01/27/19  Yes Glennie [place], NP-C    Physical Exam: Vitals:   02/09/19 1416 02/09/19 1430 02/09/19 1445 02/09/19  1500  BP:  (!) 160/86  (!) 154/86  Pulse:  (!) 121  (!) 122  Resp:    (!) 29  Temp: (!) 101.7 F (38.7 C)  (!) 103.1 F (39.5 C)   TempSrc: Oral  Oral   SpO2:  93%  93%  Weight:      Height:        Constitutional: NAD, calm, comfortable Vitals:   02/09/19 1416 02/09/19 1430 02/09/19 1445 02/09/19 1500  BP:  (!) 160/86  (!) 154/86  Pulse:  (!) 121  (!) 122  Resp:    (!) 29  Temp: (!) 101.7 F (38.7 C)  (!) 103.1 F (39.5 C)   TempSrc: Oral  Oral   SpO2:  93%  93%  Weight:  Height:       General appearance: overweight man in no distress. A little confused giving a history. Eyes: PERRL, lids and conjunctivae normal ENMT: Mucous membranes are moist. Posterior pharynx clear of any exudate or lesions.s/p lots of dental repair work. No frank carious teeth noted  Neck: normal, supple, no masses, no thyromegaly Nodes - left submandibular adenopathy, tender to touch. Fullness left posterior neck w/o specific adenopathy. Groins clear Respiratory: clear to auscultation bilaterally, no wheezing, no crackles. Normal respiratory effort. No accessory muscle use.  Cardiovascular: Regular tachycardia, no murmurs / rubs / gallops. No extremity edema. 2+ pedal pulses. No carotid bruits.  Abdomen: obese, no tenderness, no masses palpated. No hepatosplenomegaly. Bowel sounds positive.  Musculoskeletal: no clubbing / cyanosis. No joint deformity upper and lower extremities. Well healed old surgical scar medial aspect distal right LE. Good ROM, no contractures. Normal muscle tone.  Skin: no rashes, lesions, ulcers. No induration Neurologic: CN 2-12 grossly intact. Sensation intact. Strength 5/5 in all 4.  Psychiatric: Normal judgment and insight. Alert and oriented x 3. Normal mood. Formal cognitive testing not done.     Labs on Admission: I have personally reviewed following labs and imaging studies  CBC: Recent Labs  Lab 02/03/19 1115 02/04/19 0408 02/09/19 1330  WBC 0.5* 0.6* 0.4*   NEUTROABS 0.1*  --  0.0*  HGB 8.0* 8.5* 8.8*  HCT 27.6* 29.2* 31.9*  MCV 104.5* 104.3* 108.9*  PLT 9* 32* 7*   Basic Metabolic Panel: Recent Labs  Lab 02/03/19 1115 02/04/19 0408 02/09/19 1330  NA 139 141 136  K 4.3 4.7 4.1  CL 108 107 102  CO2 22 24 23   GLUCOSE 93 89 116*  BUN 26* 19 28*  CREATININE 1.20 1.00 1.32*  CALCIUM 8.8* 9.2 9.1   GFR: Estimated Creatinine Clearance: 58.3 mL/min (A) (by C-G formula based on SCr of 1.32 mg/dL (H)). Liver Function Tests: Recent Labs  Lab 02/03/19 1115 02/09/19 1330  AST 20 21  ALT 21 24  ALKPHOS 50 55  BILITOT 1.4* 3.1*  PROT 5.8* 7.0  ALBUMIN 3.4* 3.9   No results for input(s): LIPASE, AMYLASE in the last 168 hours. No results for input(s): AMMONIA in the last 168 hours. Coagulation Profile: Recent Labs  Lab 02/09/19 1330  INR 1.1   Cardiac Enzymes: No results for input(s): CKTOTAL, CKMB, CKMBINDEX, TROPONINI in the last 168 hours. BNP (last 3 results) No results for input(s): PROBNP in the last 8760 hours. HbA1C: No results for input(s): HGBA1C in the last 72 hours. CBG: No results for input(s): GLUCAP in the last 168 hours. Lipid Profile: No results for input(s): CHOL, HDL, LDLCALC, TRIG, CHOLHDL, LDLDIRECT in the last 72 hours. Thyroid Function Tests: No results for input(s): TSH, T4TOTAL, FREET4, T3FREE, THYROIDAB in the last 72 hours. Anemia Panel: No results for input(s): VITAMINB12, FOLATE, FERRITIN, TIBC, IRON, RETICCTPCT in the last 72 hours. Urine analysis:    Component Value Date/Time   COLORURINE AMBER (A) 12/19/2018 1204   APPEARANCEUR HAZY (A) 12/19/2018 1204   LABSPEC 1.026 12/19/2018 1204   PHURINE 5.0 12/19/2018 1204   GLUCOSEU NEGATIVE 12/19/2018 1204   HGBUR LARGE (A) 12/19/2018 1204   BILIRUBINUR NEGATIVE 12/19/2018 [place] 12/19/2018 1204   PROTEINUR 30 (A) 12/19/2018 1204   UROBILINOGEN 1.0 08/24/2014 1630   NITRITE NEGATIVE 12/19/2018 1204   LEUKOCYTESUR NEGATIVE  12/19/2018 1204    Radiological Exams on Admission: Dg Chest Port 1 View  Result Date: 02/09/2019 CLINICAL  DATA:  Fever and cough. Patient has leukemia receiving chemotherapy. EXAM: PORTABLE CHEST 1 VIEW COMPARISON:  08/28/2018 FINDINGS: Right subclavian Port-A-Cath unchanged. Lungs are adequately inflated without focal airspace consolidation or effusion. Cardiomediastinal silhouette and remainder the exam is unchanged. IMPRESSION: No active disease. Electronically Signed   By: Marin Olp M.D.   On: 02/09/2019 14:26    EKG: Independently reviewed. Sinus tachycardia, a few beats of bigeminy noted.  Assessment/Plan Active Problems:   Neutropenia with fever (HCC)   Essential hypertension   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HCC)   Fever and neutropenia (HCC)  (please populate well all problems here in Problem List. (For example, if patient is on BP meds at home and you resume or decide to hold them, it is a problem that needs to be her. Same for CAD, COPD, HLD and so on)   1. Fever in neutropenic patient - not clear source. Patient does have adenopathy left submandibular region. Endorses having some dental discomfort, especially with bite. Blood Cx ordered and pending. U/A negative. Plan - Cover with Vancomycin and cefipime for broad spectrum coverage.  Stool for C. Diff  F/u CBC  Will need dental exam at some point.  2. Heme/Onc - MDS progressed to AML on decitabine and venetoclax. Has been neutropenic and thrombocytopenic. No signs of active bleeding. Dr. Delton Coombes consulted - recommends platelet transfusion. Plan 3 units platelets  Continue Onc meds  F/u lab   3. GI - no recurrent bleeding.  DVT prophylaxis: SCDs (Lovenox/Heparin/SCD's/anticoagulated/None (if comfort care) Code Status: Full code (Full/Partial (specify details) Family Communication: spoke with spouse. Gave full information about treatment plan. Answered all questions (Specify name, relationship. Do not write  "discussed with patient". Specify tel # if discussed over the phone) Disposition Plan: home 2-4 days (specify when and where you expect patient to be discharged) Consults called: Oncology - Dr. Delton Coombes (with names) Admission status: inpatient (inpatient / obs / tele / medical floor / SDU)   Adella Hare MD [facility] Pager [phone]  If 7PM-7AM, please contact night-coverage [email or web address] Password [email or web address]  02/09/2019, 3:25 PM

## 2019-02-09 NOTE — ED Notes (Signed)
Patient's wife updated.

## 2019-02-09 NOTE — ED Triage Notes (Addendum)
Pt has leukemia and is receiving chemo. Pt reports that he has been running a fever and has a slight cough. Fever x 2 days. Dr Raliegh Ip wanted pt to come in to be seen. Pt also has  A sorethroat

## 2019-02-09 NOTE — ED Notes (Signed)
ED Provider at bedside. 

## 2019-02-10 DIAGNOSIS — D696 Thrombocytopenia, unspecified: Secondary | ICD-10-CM

## 2019-02-10 DIAGNOSIS — R5081 Fever presenting with conditions classified elsewhere: Secondary | ICD-10-CM

## 2019-02-10 DIAGNOSIS — D709 Neutropenia, unspecified: Secondary | ICD-10-CM

## 2019-02-10 DIAGNOSIS — R531 Weakness: Secondary | ICD-10-CM

## 2019-02-10 DIAGNOSIS — J309 Allergic rhinitis, unspecified: Secondary | ICD-10-CM

## 2019-02-10 DIAGNOSIS — T451X5A Adverse effect of antineoplastic and immunosuppressive drugs, initial encounter: Secondary | ICD-10-CM

## 2019-02-10 DIAGNOSIS — C9200 Acute myeloblastic leukemia, not having achieved remission: Secondary | ICD-10-CM

## 2019-02-10 DIAGNOSIS — D701 Agranulocytosis secondary to cancer chemotherapy: Secondary | ICD-10-CM

## 2019-02-10 LAB — CBC
HCT: 23.1 % — ABNORMAL LOW (ref 39.0–52.0)
Hemoglobin: 6.5 g/dL — CL (ref 13.0–17.0)
MCH: 30.8 pg (ref 26.0–34.0)
MCHC: 28.1 g/dL — ABNORMAL LOW (ref 30.0–36.0)
MCV: 109.5 fL — ABNORMAL HIGH (ref 80.0–100.0)
Platelets: 40 10*3/uL — ABNORMAL LOW (ref 150–400)
RBC: 2.11 MIL/uL — ABNORMAL LOW (ref 4.22–5.81)
RDW: 19.2 % — ABNORMAL HIGH (ref 11.5–15.5)
WBC: 0.2 10*3/uL — CL (ref 4.0–10.5)
nRBC: 0 % (ref 0.0–0.2)

## 2019-02-10 LAB — BPAM PLATELET PHERESIS
Blood Product Expiration Date: 202010062359
Blood Product Expiration Date: 202010062359
ISSUE DATE / TIME: 202010041907
ISSUE DATE / TIME: 202010042234
Unit Type and Rh: 5100
Unit Type and Rh: 5100

## 2019-02-10 LAB — BASIC METABOLIC PANEL
Anion gap: 10 (ref 5–15)
BUN: 23 mg/dL (ref 8–23)
CO2: 22 mmol/L (ref 22–32)
Calcium: 8.2 mg/dL — ABNORMAL LOW (ref 8.9–10.3)
Chloride: 106 mmol/L (ref 98–111)
Creatinine, Ser: 1.04 mg/dL (ref 0.61–1.24)
GFR calc Af Amer: >60 mL/min (ref >60)
GFR calc non Af Amer: >60 mL/min (ref >60)
Glucose, Bld: 101 mg/dL — ABNORMAL HIGH (ref 70–99)
Potassium: 3.8 mmol/L (ref 3.5–5.1)
Sodium: 138 mmol/L (ref 135–145)

## 2019-02-10 LAB — BLOOD CULTURE ID PANEL (REFLEXED)

## 2019-02-10 LAB — CBC WITH DIFFERENTIAL/PLATELET
Abs Immature Granulocytes: 0 10*3/uL (ref 0.00–0.07)
Basophils Absolute: 0 10*3/uL (ref 0.0–0.1)
Basophils Relative: 0 %
Eosinophils Absolute: 0 10*3/uL (ref 0.0–0.5)
Eosinophils Relative: 0 %
HCT: 25.8 % — ABNORMAL LOW (ref 39.0–52.0)
Hemoglobin: 7.2 g/dL — ABNORMAL LOW (ref 13.0–17.0)
Immature Granulocytes: 0 %
Lymphocytes Relative: 98 %
Lymphs Abs: 0.6 10*3/uL — ABNORMAL LOW (ref 0.7–4.0)
MCH: 30.8 pg (ref 26.0–34.0)
MCHC: 27.9 g/dL — ABNORMAL LOW (ref 30.0–36.0)
MCV: 110.3 fL — ABNORMAL HIGH (ref 80.0–100.0)
Monocytes Absolute: 0 10*3/uL — ABNORMAL LOW (ref 0.1–1.0)
Monocytes Relative: 0 %
Neutro Abs: 0 10*3/uL — ABNORMAL LOW (ref 1.7–7.7)
Neutrophils Relative %: 2 %
Platelets: 40 10*3/uL — ABNORMAL LOW (ref 150–400)
RBC: 2.34 MIL/uL — ABNORMAL LOW (ref 4.22–5.81)
RDW: 19.5 % — ABNORMAL HIGH (ref 11.5–15.5)
WBC: 0.6 10*3/uL — CL (ref 4.0–10.5)
nRBC: 3.4 % — ABNORMAL HIGH (ref 0.0–0.2)

## 2019-02-10 LAB — PREPARE PLATELET PHERESIS
Unit division: 0
Unit division: 0

## 2019-02-10 LAB — PREPARE RBC (CROSSMATCH)

## 2019-02-10 MED ORDER — SODIUM CHLORIDE 0.9 % IV SOLN
2.0000 g | INTRAVENOUS | Status: DC
  Administered 2019-02-10 – 2019-02-18 (×9): 2 g via INTRAVENOUS
  Filled 2019-02-10 (×10): qty 20

## 2019-02-10 MED ORDER — SODIUM CHLORIDE 0.9% IV SOLUTION: Freq: Once | INTRAVENOUS | Status: DC

## 2019-02-10 MED ORDER — TBO-FILGRASTIM 480 MCG/0.8ML ~~LOC~~ SOSY
480.0000 ug | PREFILLED_SYRINGE | Freq: Every day | SUBCUTANEOUS | Status: DC
  Administered 2019-02-10 – 2019-02-15 (×6): 480 ug via SUBCUTANEOUS
  Filled 2019-02-10 (×7): qty 0.8

## 2019-02-10 NOTE — Progress Notes (Signed)
Patient temp still 102.5 despite tylenol administration. Notified Dr. Dwyane Dee orders received.

## 2019-02-10 NOTE — Progress Notes (Signed)
PROGRESS NOTE    Richard Wallace  [id number] DOB: 1946/02/12 DOA: 02/09/2019 PCP: [name], MD     Brief Narrative:  73 y.o. male with medical history significant of active chemotherapy for AML, last tx 2 weeks ago, CKD, GERD, HTN, atrophy of kidney and dc from here 5 days ago where he was admitted for rectal bleeding and severe thrombocytopenia (9000) now presenting with fever to 104 since last night w/o sweats but with rigors, mild nonproductive cough (denies sob) and sore throat. He endorses nausea but this is a chronic finding, not new. He denies diarrhea but has had some loose stools. Denies mucus or blood in the stool currently.. Denies chest pain, headache, abdominal pain, dysuria. He denies unusual bruising or petechiae. No further rectal bleeding since the day of dc from here 9/29. He reports feeling profoundly weak without dizziness, no focal weakness.    Assessment & Plan: 1-neutropenic fever -Patient absolute neutrophil 0.0 -Blood cultures positive for Streptococcus species bacteremia -Antibiotic has been narrowed to cover appropriately -Follow speciation and sensitivity -Continue supportive care -Patient continues spiking fever -After discussing with oncology service will provide Neulasta subcutaneously until absolute neutrophils above 4000. -Follow clinical response.  2-leukemia/MDS -Continue follow-up with oncology service -Patient actively receiving chemotherapy.  3-pancytopenia -Patient received 3 units of platelets as his platelet level 7000 on admission; currently of the 40,000 range. -No signs of overt bleeding. -Hemoglobin of 7.2 borderline; will continue monitoring to provide PRBC transfusion as needed. -Leukopenia; treatment as mentioned above.  4-essential hypertension -Currently not taking any medication as an outpatient -Continue to monitor vital signs and initiate antihypertensive management if needed.  5-allergy rhinitis -Continue the  use of loratadine.  DVT prophylaxis: SCDs Code Status: Full code Family Communication: Wife at bedside Disposition Plan: Remains inpatient, start Neupogen subcutaneously as recommended by oncology service; continue IV antibiotics (narrowing based on culture results to the use of Rocephin), follow final culture results.  Discontinue contact precautions and stop C Diff testing.  Consultants:   Oncology service  Procedures:   See below for x-ray report.  Antimicrobials:  Anti-infectives (From admission, onward)   Start     Dose/Rate Route Frequency Ordered Stop   02/10/19 1600  vancomycin (VANCOCIN) 1,500 mg in sodium chloride 0.9 % 500 mL IVPB  Status:  Discontinued     1,500 mg 250 mL/hr over 120 Minutes Intravenous Every 24 hours 02/09/19 1936 02/10/19 0913   02/10/19 1200  cefTRIAXone (ROCEPHIN) 2 g in sodium chloride 0.9 % 100 mL IVPB     2 g 200 mL/hr over 30 Minutes Intravenous Every 24 hours 02/10/19 0913     02/09/19 2200  ceFEPIme (MAXIPIME) 2 g in sodium chloride 0.9 % 100 mL IVPB  Status:  Discontinued     2 g 200 mL/hr over 30 Minutes Intravenous Every 8 hours 02/09/19 1925 02/10/19 0913   02/09/19 1745  vancomycin (VANCOCIN) 1,500 mg in sodium chloride 0.9 % 500 mL IVPB  Status:  Discontinued     1,500 mg 250 mL/hr over 120 Minutes Intravenous  Once 02/09/19 1733 02/09/19 1734   02/09/19 1745  vancomycin (VANCOCIN) IVPB 1000 mg/200 mL premix     1,000 mg 100 mL/hr over 120 Minutes Intravenous  Once 02/09/19 1734 02/09/19 2019   02/09/19 1345  vancomycin (VANCOCIN) IVPB 1000 mg/200 mL premix     1,000 mg 200 mL/hr over 60 Minutes Intravenous  Once 02/09/19 1331 02/09/19 1513   02/09/19 1345  ceFEPIme (MAXIPIME) 2 g in  sodium chloride 0.9 % 100 mL IVPB     2 g 200 mL/hr over 30 Minutes Intravenous  Once 02/09/19 1331 02/09/19 1435       Subjective: No chest pain, no shortness of breath, no nausea, no vomiting, no abdominal pain.  Patient reports no diarrhea  currently or prior to come to the hospital.  Continues spiking fever with a T-max of 103.4.  Objective: Vitals:   02/10/19 0354 02/10/19 0513 02/10/19 1000 02/10/19 1451  BP:  135/78  (!) 155/81  Pulse:  92  (!) 101  Resp:  16  16  Temp: (!) 102.4 F (39.1 C) (!) 102.5 F (39.2 C) (!) 100.8 F (38.2 C) (!) 103.1 F (39.5 C)  TempSrc: Oral Oral Oral Oral  SpO2:  96%  97%  Weight:      Height:        Intake/Output Summary (Last 24 hours) at 02/10/2019 1546 Last data filed at 02/10/2019 0700 Gross per 24 hour  Intake 2978.33 ml  Output 700 ml  Net 2278.33 ml   Filed Weights   02/09/19 1202  Weight: 102.1 kg    Examination: General exam: Alert, awake, oriented x 3, still spiking fever, denies chest pain, no nausea, no vomiting.  Patient reports no diarrhea.  No abdominal pain. Respiratory system: Clear to auscultation. Respiratory effort normal. Cardiovascular system:RRR. No murmurs, rubs, gallops. Gastrointestinal system: Abdomen is nondistended, soft and nontender. No organomegaly or masses felt. Normal bowel sounds heard. Central nervous system: Alert and oriented. No focal neurological deficits. Extremities: No cyanosis or clubbing. Skin: No rashes, no petechiae. Psychiatry: Judgement and insight appear normal. Mood & affect appropriate.     Data Reviewed: I have personally reviewed following labs and imaging studies  CBC: Recent Labs  Lab 02/04/19 0408 02/09/19 1330 02/10/19 0527 02/10/19 0845  WBC 0.6* 0.4* 0.2* 0.6*  NEUTROABS  --  0.0*  --  0.0*  HGB 8.5* 8.8* 6.5* 7.2*  HCT 29.2* 31.9* 23.1* 25.8*  MCV 104.3* 108.9* 109.5* 110.3*  PLT 32* 7* 40* 40*   Basic Metabolic Panel: Recent Labs  Lab 02/04/19 0408 02/09/19 1330 02/10/19 0527  NA 141 136 138  K 4.7 4.1 3.8  CL 107 102 106  CO2 24 23 22   GLUCOSE 89 116* 101*  BUN 19 28* 23  CREATININE 1.00 1.32* 1.04  CALCIUM 9.2 9.1 8.2*   GFR: Estimated Creatinine Clearance: 74 mL/min (by C-G  formula based on SCr of 1.04 mg/dL). Liver Function Tests: Recent Labs  Lab 02/09/19 1330  AST 21  ALT 24  ALKPHOS 55  BILITOT 3.1*  PROT 7.0  ALBUMIN 3.9   Coagulation Profile: Recent Labs  Lab 02/09/19 1330  INR 1.1   Urine analysis:    Component Value Date/Time   COLORURINE AMBER (A) 02/09/2019 1545   APPEARANCEUR CLEAR 02/09/2019 1545   LABSPEC 1.024 02/09/2019 1545   PHURINE 5.0 02/09/2019 1545   GLUCOSEU NEGATIVE 02/09/2019 1545   HGBUR SMALL (A) 02/09/2019 1545   BILIRUBINUR NEGATIVE 02/09/2019 1545   KETONESUR NEGATIVE 02/09/2019 1545   PROTEINUR 100 (A) 02/09/2019 1545   UROBILINOGEN 1.0 08/24/2014 1630   NITRITE NEGATIVE 02/09/2019 1545   LEUKOCYTESUR NEGATIVE 02/09/2019 1545    Recent Results (from the past 240 hour(s))  SARS Coronavirus 2 [facility] order, Performed in [facility] hospital lab) Nasopharyngeal Nasopharyngeal Swab     Status: None   Collection Time: 02/03/19 12:31 PM   Specimen: Nasopharyngeal Swab  Result Value Ref Range Status  SARS Coronavirus 2 NEGATIVE NEGATIVE Final    Comment: (NOTE) If result is NEGATIVE SARS-CoV-2 target nucleic acids are NOT DETECTED. The SARS-CoV-2 RNA is generally detectable in upper and lower  respiratory specimens during the acute phase of infection. The lowest  concentration of SARS-CoV-2 viral copies this assay can detect is 250  copies / mL. A negative result does not preclude SARS-CoV-2 infection  and should not be used as the sole basis for treatment or other  patient management decisions.  A negative result may occur with  improper specimen collection / handling, submission of specimen other  than nasopharyngeal swab, presence of viral mutation(s) within the  areas targeted by this assay, and inadequate number of viral copies  (<250 copies / mL). A negative result must be combined with clinical  observations, patient history, and epidemiological information. If result is POSITIVE SARS-CoV-2 target  nucleic acids are DETECTED. The SARS-CoV-2 RNA is generally detectable in upper and lower  respiratory specimens dur ing the acute phase of infection.  Positive  results are indicative of active infection with SARS-CoV-2.  Clinical  correlation with patient history and other diagnostic information is  necessary to determine patient infection status.  Positive results do  not rule out bacterial infection or co-infection with other viruses. If result is PRESUMPTIVE POSTIVE SARS-CoV-2 nucleic acids MAY BE PRESENT.   A presumptive positive result was obtained on the submitted specimen  and confirmed on repeat testing.  While 2019 novel coronavirus  (SARS-CoV-2) nucleic acids may be present in the submitted sample  additional confirmatory testing may be necessary for epidemiological  and / or clinical management purposes  to differentiate between  SARS-CoV-2 and other Sarbecovirus currently known to infect humans.  If clinically indicated additional testing with an alternate test  methodology [phone]) is advised. The SARS-CoV-2 RNA is generally  detectable in upper and lower respiratory sp ecimens during the acute  phase of infection. The expected result is Negative. Fact Sheet for Patients:  StrictlyIdeas.no Fact Sheet for Healthcare Providers: BankingDealers.co.za This test is not yet approved or cleared by the [place] FDA and has been authorized for detection and/or diagnosis of SARS-CoV-2 by FDA under an Emergency Use Authorization (EUA).  This EUA will remain in effect (meaning this test can be used) for the duration of the COVID-19 declaration under Section 564(b)(1) of the Act, 21 U.S.C. section 360bbb-3(b)(1), unless the authorization is terminated or revoked sooner. Performed at [facility], [address] [place], [place] [address]   MRSA PCR Screening     Status: None   Collection Time: 02/03/19  3:42 PM   Specimen: Nasal  Mucosa; Nasopharyngeal  Result Value Ref Range Status   MRSA by PCR NEGATIVE NEGATIVE Final    Comment:        The GeneXpert MRSA Assay (FDA approved for NASAL specimens only), is one component of a comprehensive MRSA colonization surveillance program. It is not intended to diagnose MRSA infection nor to guide or monitor treatment for MRSA infections. Performed at [facility], [address], [place], [place] [address]   Blood Culture (routine x 2)     Status: None (Preliminary result)   Collection Time: 02/09/19  1:30 PM   Specimen: Right Antecubital; Blood  Result Value Ref Range Status   Specimen Description   Final    RIGHT ANTECUBITAL Performed at [facility], [address] [place], [place] [address]    Special Requests   Final    BOTTLES DRAWN AEROBIC AND  ANAEROBIC Blood Culture results may not be optimal due to an excessive volume of blood received in culture bottles Performed at [facility], [address] [place], [place] [address]    Culture  Setup Time   Final    GRAM POSITIVE COCCI BONDURANT,R @ 0210 ON 02/10/19 BY JUW ANAEROBIC BOTTLE ONLY GS DONE @ [facility] CRITICAL RESULT CALLED TO, READ BACK BY AND VERIFIED WITH: PHARMD STEVENS H. [id number] FCP Performed at [facility], [place] [address], [place], [place] [address]    Culture GRAM POSITIVE COCCI  Final   Report Status PENDING  Incomplete  Blood Culture ID Panel (Reflexed)     Status: Abnormal   Collection Time: 02/09/19  1:30 PM  Result Value Ref Range Status   Enterococcus species NOT DETECTED NOT DETECTED Final   Listeria monocytogenes NOT DETECTED NOT DETECTED Final   Staphylococcus species NOT DETECTED NOT DETECTED Final   Staphylococcus aureus (BCID) NOT DETECTED NOT DETECTED Final   Streptococcus species DETECTED (A) NOT DETECTED Final    Comment: Not Enterococcus species, Streptococcus agalactiae, Streptococcus pyogenes, or Streptococcus pneumoniae. CRITICAL RESULT CALLED TO, READ BACK BY AND  VERIFIED WITH: PHARMD STEVENS H. [id number] FCP    Streptococcus agalactiae NOT DETECTED NOT DETECTED Final   Streptococcus pneumoniae NOT DETECTED NOT DETECTED Final   Streptococcus pyogenes NOT DETECTED NOT DETECTED Final   Acinetobacter baumannii NOT DETECTED NOT DETECTED Final   Enterobacteriaceae species NOT DETECTED NOT DETECTED Final   Enterobacter cloacae complex NOT DETECTED NOT DETECTED Final   Escherichia coli NOT DETECTED NOT DETECTED Final   Klebsiella oxytoca NOT DETECTED NOT DETECTED Final   Klebsiella pneumoniae NOT DETECTED NOT DETECTED Final   Proteus species NOT DETECTED NOT DETECTED Final   Serratia marcescens NOT DETECTED NOT DETECTED Final   Haemophilus influenzae NOT DETECTED NOT DETECTED Final   Neisseria meningitidis NOT DETECTED NOT DETECTED Final   Pseudomonas aeruginosa NOT DETECTED NOT DETECTED Final   Candida albicans NOT DETECTED NOT DETECTED Final   Candida glabrata NOT DETECTED NOT DETECTED Final   Candida krusei NOT DETECTED NOT DETECTED Final   Candida parapsilosis NOT DETECTED NOT DETECTED Final   Candida tropicalis NOT DETECTED NOT DETECTED Final    Comment: Performed at [facility], [place]. [address] [place], [place] [address]  Blood Culture (routine x 2)     Status: None (Preliminary result)   Collection Time: 02/09/19  1:33 PM   Specimen: BLOOD RIGHT HAND  Result Value Ref Range Status   Specimen Description   Final    BLOOD RIGHT HAND Performed at [facility], [address] [place], [place] [address]    Special Requests   Final    BOTTLES DRAWN AEROBIC AND ANAEROBIC Blood Culture adequate volume Performed at [facility], [address], [place], [place] [address]    Culture  Setup Time   Final    GRAM POSITIVE COCCI Gram Stain Report Called to,Read Back By and Verified With: BONDURANT,R @ 0210 ON 02/10/19 BY JUW ANAEROBIC BOTTLE ONLY GS DONE @ [facility] Performed at [facility], [place] [address], [place], [place] [address]     Culture [facility] POSITIVE COCCI  Final   Report Status PENDING  Incomplete  SARS Coronavirus 2 [facility] order, Performed in [facility] hospital lab) Nasopharyngeal Nasopharyngeal Swab     Status: None   Collection Time: 02/09/19  2:00 PM   Specimen: Nasopharyngeal Swab  Result Value Ref Range Status   SARS  Coronavirus 2 NEGATIVE NEGATIVE Final    Comment: (NOTE) If result is NEGATIVE SARS-CoV-2 target nucleic acids are NOT DETECTED. The SARS-CoV-2 RNA is generally detectable in upper and lower  respiratory specimens during the acute phase of infection. The lowest  concentration of SARS-CoV-2 viral copies this assay can detect is 250  copies / mL. A negative result does not preclude SARS-CoV-2 infection  and should not be used as the sole basis for treatment or other  patient management decisions.  A negative result may occur with  improper specimen collection / handling, submission of specimen other  than nasopharyngeal swab, presence of viral mutation(s) within the  areas targeted by this assay, and inadequate number of viral copies  (<250 copies / mL). A negative result must be combined with clinical  observations, patient history, and epidemiological information. If result is POSITIVE SARS-CoV-2 target nucleic acids are DETECTED. The SARS-CoV-2 RNA is generally detectable in upper and lower  respiratory specimens dur ing the acute phase of infection.  Positive  results are indicative of active infection with SARS-CoV-2.  Clinical  correlation with patient history and other diagnostic information is  necessary to determine patient infection status.  Positive results do  not rule out bacterial infection or co-infection with other viruses. If result is PRESUMPTIVE POSTIVE SARS-CoV-2 nucleic acids MAY BE PRESENT.   A presumptive positive result was obtained on the submitted specimen  and confirmed on repeat testing.  While 2019 novel coronavirus  (SARS-CoV-2) nucleic acids may be  present in the submitted sample  additional confirmatory testing may be necessary for epidemiological  and / or clinical management purposes  to differentiate between  SARS-CoV-2 and other Sarbecovirus currently known to infect humans.  If clinically indicated additional testing with an alternate test  methodology [phone]) is advised. The SARS-CoV-2 RNA is generally  detectable in upper and lower respiratory sp ecimens during the acute  phase of infection. The expected result is Negative. Fact Sheet for Patients:  StrictlyIdeas.no Fact Sheet for Healthcare Providers: BankingDealers.co.za This test is not yet approved or cleared by the [place] FDA and has been authorized for detection and/or diagnosis of SARS-CoV-2 by FDA under an Emergency Use Authorization (EUA).  This EUA will remain in effect (meaning this test can be used) for the duration of the COVID-19 declaration under Section 564(b)(1) of the Act, 21 U.S.C. section 360bbb-3(b)(1), unless the authorization is terminated or revoked sooner. Performed at [facility], [address], [place], [place] [address]   Urine culture     Status: None (Preliminary result)   Collection Time: 02/09/19  3:45 PM   Specimen: In/Out Cath Urine  Result Value Ref Range Status   Specimen Description   Final    IN/OUT CATH URINE Performed at [facility], [address], [place], [place] [address]    Special Requests   Final    Normal Performed at [facility], [address] [place], [place] [address]    Culture   Final    CULTURE REINCUBATED FOR BETTER GROWTH Performed at [facility], [place] [address], [place], [place] [address]    Report Status PENDING  Incomplete  Culture, blood (single)     Status: None (Preliminary result)   Collection Time: 02/10/19  5:27 AM   Specimen: Porta Cath; Blood  Result Value Ref Range Status   Specimen Description PORTA CATH BOTTLES DRAWN AEROBIC  AND ANAEROBIC  Final   Special Requests Blood Culture adequate volume  Final  Culture   Final    NO GROWTH <12 HOURS Performed at [facility], [address] [place], [place] [address]    Report Status PENDING  Incomplete     Radiology Studies: Dg Chest Port 1 View  Result Date: 02/09/2019 CLINICAL DATA:  Fever and cough. Patient has leukemia receiving chemotherapy. EXAM: PORTABLE CHEST 1 VIEW COMPARISON:  08/28/2018 FINDINGS: Right subclavian Port-A-Cath unchanged. Lungs are adequately inflated without focal airspace consolidation or effusion. Cardiomediastinal silhouette and remainder the exam is unchanged. IMPRESSION: No active disease. Electronically Signed   By: Marin Olp M.D.   On: 02/09/2019 14:26    Scheduled Meds: . sodium chloride   Intravenous Once  . allopurinol  300 mg Oral QPM  . Chlorhexidine Gluconate Cloth  6 each Topical Daily  . docusate sodium  100 mg Oral QHS  . loratadine  10 mg Oral Daily  . magnesium oxide  400 mg Oral QHS  . Tbo-Filgrastim  480 mcg Subcutaneous Daily   Continuous Infusions: . sodium chloride    . cefTRIAXone (ROCEPHIN)  IV 2 g (02/10/19 1306)     LOS: 1 day    Time spent: 35 minutes. Greater than 50% of this time was spent in direct contact with the patient, coordinating care and discussing relevant ongoing clinical issues, including positive culture for Streptococcus; explanation regarding neutropenic fever, the need for platelet transfusion and also initiation of Neulasta.  Case has been discussed with oncology service.  Continue supportive care, IV antibiotics and follow final culture results.Barton Dubois, MD [facility] Pager [phone]   02/10/2019, 3:46 PM

## 2019-02-10 NOTE — Progress Notes (Signed)
CRITICAL VALUE ALERT  Critical Value:  BC gram positive cocci both sets  Date & Time Notied:  02/10/2019 2:46 AM  Provider Notified: Dwyane Dee  Orders Received/Actions taken: none

## 2019-02-10 NOTE — Progress Notes (Signed)
PHARMACY - PHYSICIAN COMMUNICATION CRITICAL VALUE ALERT - BLOOD CULTURE IDENTIFICATION (BCID)  Richard Wallace is an 73 y.o. male who presented to [facility] on 02/09/2019 with a chief complaint of fever and weakness  Assessment:  Strep BICD  Name of physician (or Provider) Contacted: [place]  Current antibiotics: Vanco and Cefepime  Changes to prescribed antibiotics recommended: Ceftriaxone 2000 mg IV every 24 hours. Recommendations accepted by provider  Results for orders placed or performed during the hospital encounter of 02/09/19  Blood Culture ID Panel (Reflexed) (Collected: 02/09/2019  1:30 PM)  Result Value Ref Range   Enterococcus species NOT DETECTED NOT DETECTED   Listeria monocytogenes NOT DETECTED NOT DETECTED   Staphylococcus species NOT DETECTED NOT DETECTED   Staphylococcus aureus (BCID) NOT DETECTED NOT DETECTED   Streptococcus species DETECTED (A) NOT DETECTED   Streptococcus agalactiae NOT DETECTED NOT DETECTED   Streptococcus pneumoniae NOT DETECTED NOT DETECTED   Streptococcus pyogenes NOT DETECTED NOT DETECTED   Acinetobacter baumannii NOT DETECTED NOT DETECTED   Enterobacteriaceae species NOT DETECTED NOT DETECTED   Enterobacter cloacae complex NOT DETECTED NOT DETECTED   Escherichia coli NOT DETECTED NOT DETECTED   Klebsiella oxytoca NOT DETECTED NOT DETECTED   Klebsiella pneumoniae NOT DETECTED NOT DETECTED   Proteus species NOT DETECTED NOT DETECTED   Serratia marcescens NOT DETECTED NOT DETECTED   Haemophilus influenzae NOT DETECTED NOT DETECTED   Neisseria meningitidis NOT DETECTED NOT DETECTED   Pseudomonas aeruginosa NOT DETECTED NOT DETECTED   Candida albicans NOT DETECTED NOT DETECTED   Candida glabrata NOT DETECTED NOT DETECTED   Candida krusei NOT DETECTED NOT DETECTED   Candida parapsilosis NOT DETECTED NOT DETECTED   Candida tropicalis NOT DETECTED NOT DETECTED    Ramond Craver 02/10/2019  9:16 AM

## 2019-02-10 NOTE — Consult Note (Signed)
[facility] Consultation Oncology  Name: Richard Wallace      MRN: [id number]    Location: A326/A326-01  Date: 02/10/2019 Time:6:02 PM   REFERRING PHYSICIAN: Dr. 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  He was started on cycle 1 of decitabine with venetoclax on 01/20/2019.  He was admitted on 02/03/2019 with rectal bleeding and was discharged on 02/04/2019.  Venetoclax was held on 02/03/2019.  He had received multiple units of platelet transfusion at that time.  His wife called me yesterday that he was having fevers of more than 101.  I have recommended that he come to the ER and get admitted.  He denied any nausea or vomiting.  He denied some soreness on the tongue and back of the throat.  He denied any diarrhea.  He was having chills associated with high-grade fevers.  In the ER he was found to be pancytopenic with severely low white count and platelet count.  He did receive 3 units of PRBC.  Chest x-ray on 02/09/2019 did not show any evidence of pneumonia.  Blood culture showed Streptococcus species.  He was started on broad-spectrum antibiotics.  PAST MEDICAL HISTORY:   Past Medical History:  Diagnosis Date  . Arthritis   . 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 Cancer ([place]) 01-28-2014   skin cancer  . CKD (chronic kidney disease), stage III   . GERD (gastroesophageal reflux disease)   . Heart murmur    NOTED DURING PHYSICAL WHEN HE WAS ENLISTING IN MILITARY , DIDNT KNOW UNTIL THAT TIME AND REPORTS , "THATS THE LAST I HEARD ABOUT IT "   . History of hypertension    no longer issue  .  History of kidney stones   . History of malignant melanoma of skin    excision top of scalp 2015-- no recurrence  . History of urinary retention    post op lumbar fusion surgery 04/ 2016  . Hypertension   . 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 Left ureteral calculus   . Seasonal allergies   . Wears glasses   . Wears glasses   . Wears partial dentures    upper and lower    ALLERGIES: No Known Allergies    MEDICATIONS: I have reviewed the patient's current medications.     PAST SURGICAL HISTORY Past Surgical History:  Procedure Laterality Date  . ANKLE FUSION Right 2007  . CARPAL TUNNEL RELEASE Left 12/28/2009   w/ pulley release left long finger  . CARPAL TUNNEL RELEASE Right 07/22/2013   Procedure: RIGHT CARPAL TUNNEL RELEASE;  Surgeon: Cammie Sickle., MD;  Location: [facility];  Service: Orthopedics;  Laterality: Right;  . COLONOSCOPY    . CYSTO/  LEFT RETROGRADE PYELOGRAM  11/21/2010  . CYSTOSCOPY WITH STENT PLACEMENT Left 03/09/2016   Procedure: CYSTOSCOPY WITH STENT PLACEMENT;  Surgeon: Franchot Gallo, MD;  Location: [facility];  Service: Urology;  Laterality: Left;  . CYSTOSCOPY/RETROGRADE/URETEROSCOPY/STONE EXTRACTION WITH BASKET Left 03/09/2016   Procedure: CYSTOSCOPY/RETROGRADE/URETEROSCOPY/STONE  EXTRACTION WITH BASKET;  Surgeon: Franchot Gallo, MD;  Location: [facility];  Service: Urology;  Laterality: Left;  . LEFT URETEROSCOPIC LASER LITHOTRIPSY STONE EXTRACTION/ STENT PLACEMENT  05/23/2010  . MOHS SURGERY     TOP OF THE HEAD   . ORIF ANKLE FRACTURE Right 1978  . PORTACATH PLACEMENT Right 08/19/2018   Procedure: INSERTION PORT-A-CATH (attached catheter in right subclavian);  Surgeon: Aviva Signs, MD;  Location: [facility];  Service: General;  Laterality: Right;  . POSTERIOR LUMBAR FUSION  08/21/2014   laminectomy and decompression L2 -- L5  .  RIGHT LOWER LEG SURGERY  X3  1975 to 1976   including ORIF  . TONSILLECTOMY AND ADENOIDECTOMY  1986  . UMBILICAL HERNIA REPAIR  2009 approx    FAMILY HISTORY: Family History  Problem Relation Age of Onset  . Stroke Mother   . Prostate cancer Father   . Bone cancer Father   . Diverticulitis Father   . Rheum arthritis Sister   . Urinary tract infection Sister   . Colon cancer Neg Hx     SOCIAL HISTORY:  reports that he quit smoking about 32 years ago. His smoking use included cigarettes. He quit after 20.00 years of use. He has never used smokeless tobacco. He reports current alcohol use of about 7.0 - 14.0 standard drinks of alcohol per week. He reports that he does not use drugs.  PERFORMANCE STATUS: The patient's performance status is 2 - Symptomatic, <50% confined to bed  PHYSICAL EXAM: Most Recent Vital Signs: Blood pressure (!) 155/81, pulse (!) 101, temperature (!) 103.1 F (39.5 C), temperature source Oral, resp. rate 16, height 5' 9.5" (1.765 m), weight 225 lb (102.1 kg), SpO2 97 %. BP (!) 141/72 (BP Location: Left Arm)   Pulse 91   Temp 100.3 F (37.9 C) (Oral)   Resp 16   Ht 5' 9.5" (1.765 m)   Wt 225 lb (102.1 kg)   SpO2 99%   BMI 32.75 kg/m  General appearance: alert and cooperative Lungs: clear to auscultation bilaterally Heart: regular rate and rhythm Abdomen: soft, non-tender; bowel sounds normal; no masses,  no organomegaly Extremities: 1+ edema bilaterally.  No cyanosis. Skin: Skin color, texture, turgor normal. No rashes or lesions Lymph nodes: Cervical, supraclavicular, and axillary nodes normal. Neurologic: Grossly normal  LABORATORY DATA:  Results for orders placed or performed during the hospital encounter of 02/09/19 (from the past 48 hour(s))  Lactic acid, plasma     Status: None   Collection Time: 02/09/19  1:30 PM  Result Value Ref Range   Lactic Acid, Venous 1.7 0.5 - 1.9 mmol/L    Comment: Performed at [facility], [address],  [place], [place] [address]  Comprehensive metabolic panel     Status: Abnormal   Collection Time: 02/09/19  1:30 PM  Result Value Ref Range   Sodium 136 135 - 145 mmol/L   Potassium 4.1 3.5 - 5.1 mmol/L   Chloride 102 98 - 111 mmol/L   CO2 23 22 - 32 mmol/L   Glucose, Bld 116 (H) 70 - 99 mg/dL   BUN 28 (H) 8 - 23 mg/dL   Creatinine, Ser 1.32 (H) 0.61 - 1.24 mg/dL   Calcium 9.1 8.9 - 10.3 mg/dL   Total Protein 7.0 6.5 - 8.1 g/dL   Albumin 3.9 3.5 - 5.0 g/dL   AST 21 15 - 41 U/L   ALT 24 0 - 44 U/L   Alkaline Phosphatase 55 38 - 126  U/L   Total Bilirubin 3.1 (H) 0.3 - 1.2 mg/dL   GFR calc non Af Amer 53 (L) >60 mL/min   GFR calc Af Amer >60 >60 mL/min   Anion gap 11 5 - 15    Comment: Performed at [facility], [address], [place], [place] [address]  CBC WITH DIFFERENTIAL     Status: Abnormal   Collection Time: 02/09/19  1:30 PM  Result Value Ref Range   WBC 0.4 (LL) 4.0 - 10.5 K/uL    Comment: REPEATED TO VERIFY THIS CRITICAL RESULT HAS VERIFIED AND BEEN CALLED TO FARMER, E BY ED AGUNDIZ ON 10 04 2020 AT 1405, AND HAS BEEN READ BACK. CALLED IN PRIOR TO RESULTING AT 1358 BY L. HENDERSON    RBC 2.93 (L) 4.22 - 5.81 MIL/uL   Hemoglobin 8.8 (L) 13.0 - 17.0 g/dL   HCT 31.9 (L) 39.0 - 52.0 %   MCV 108.9 (H) 80.0 - 100.0 fL   MCH 30.0 26.0 - 34.0 pg   MCHC 27.6 (L) 30.0 - 36.0 g/dL   RDW 19.7 (H) 11.5 - 15.5 %   Platelets 7 (LL) 150 - 400 K/uL    Comment: REPEATED TO VERIFY THIS CRITICAL RESULT HAS VERIFIED AND BEEN CALLED TO FARMER, E BY ED AGUNDIZ ON 10 04 2020 AT 1405, AND HAS BEEN READ BACK. CALLED IN PRIOR TO RESULTING AT 1358 BY L. HENDERSON    nRBC 0.0 0.0 - 0.2 %   Neutrophils Relative % 5 %   Neutro Abs 0.0 (L) 1.7 - 7.7 K/uL   Lymphocytes Relative 95 %   Lymphs Abs 0.4 (L) 0.7 - 4.0 K/uL   Monocytes Relative 0 %   Monocytes Absolute 0.0 (L) 0.1 - 1.0 K/uL   Eosinophils Relative 0 %   Eosinophils Absolute 0.0 0.0 - 0.5 K/uL   Basophils Relative 0 %   Basophils Absolute  0.0 0.0 - 0.1 K/uL   Immature Granulocytes 0 %   Abs Immature Granulocytes 0.00 0.00 - 0.07 K/uL    Comment: Performed at [facility], [address] [place], [place] [address]  APTT     Status: None   Collection Time: 02/09/19  1:30 PM  Result Value Ref Range   aPTT 33 24 - 36 seconds    Comment: Performed at [facility], [address] [place], [place] [address]  Protime-INR     Status: None   Collection Time: 02/09/19  1:30 PM  Result Value Ref Range   Prothrombin Time 14.3 11.4 - 15.2 seconds   INR 1.1 0.8 - 1.2    Comment: (NOTE) INR goal varies based on device and disease states. Performed at [facility], [address] [place], [place] [address]   Blood Culture (routine x 2)     Status: None (Preliminary result)   Collection Time: 02/09/19  1:30 PM   Specimen: Right Antecubital; Blood  Result Value Ref Range   Specimen Description      RIGHT ANTECUBITAL Performed at [facility], [address], [place], [place] [address]    Special Requests      BOTTLES DRAWN AEROBIC AND ANAEROBIC Blood Culture results may not be optimal due to an excessive volume of blood received in culture bottles Performed at [facility], [address] [place], [place] [address]    Culture  Setup Time      GRAM POSITIVE COCCI BONDURANT,R @ 0210 ON 02/10/19 BY JUW ANAEROBIC BOTTLE ONLY GS DONE @ [facility] CRITICAL RESULT CALLED  TO, READ BACK BY AND VERIFIED WITH: PHARMD STEVENS H. [phone] [id number] FCP Performed at [facility], [place] [address] [place], [place] [address]    Culture GRAM POSITIVE COCCI    Report Status PENDING   Blood Culture ID Panel (Reflexed)     Status: Abnormal   Collection Time: 02/09/19  1:30 PM  Result Value Ref Range   Enterococcus species NOT DETECTED NOT DETECTED   Listeria monocytogenes NOT DETECTED NOT DETECTED   Staphylococcus species NOT DETECTED NOT DETECTED   Staphylococcus aureus (BCID) NOT DETECTED NOT DETECTED   Streptococcus species DETECTED (A) NOT DETECTED     Comment: Not Enterococcus species, Streptococcus agalactiae, Streptococcus pyogenes, or Streptococcus pneumoniae. CRITICAL RESULT CALLED TO, READ BACK BY AND VERIFIED WITH: PHARMD STEVENS H. [phone] [id number] FCP    Streptococcus agalactiae NOT DETECTED NOT DETECTED   Streptococcus pneumoniae NOT DETECTED NOT DETECTED   Streptococcus pyogenes NOT DETECTED NOT DETECTED   Acinetobacter baumannii NOT DETECTED NOT DETECTED   Enterobacteriaceae species NOT DETECTED NOT DETECTED   Enterobacter cloacae complex NOT DETECTED NOT DETECTED   Escherichia coli NOT DETECTED NOT DETECTED   Klebsiella oxytoca NOT DETECTED NOT DETECTED   Klebsiella pneumoniae NOT DETECTED NOT DETECTED   Proteus species NOT DETECTED NOT DETECTED   Serratia marcescens NOT DETECTED NOT DETECTED   Haemophilus influenzae NOT DETECTED NOT DETECTED   Neisseria meningitidis NOT DETECTED NOT DETECTED   Pseudomonas aeruginosa NOT DETECTED NOT DETECTED   Candida albicans NOT DETECTED NOT DETECTED   Candida glabrata NOT DETECTED NOT DETECTED   Candida krusei NOT DETECTED NOT DETECTED   Candida parapsilosis NOT DETECTED NOT DETECTED   Candida tropicalis NOT DETECTED NOT DETECTED    Comment: Performed at [facility] [address], [place], [place] [address]  Blood Culture (routine x 2)     Status: None (Preliminary result)   Collection Time: 02/09/19  1:33 PM   Specimen: BLOOD RIGHT HAND  Result Value Ref Range   Specimen Description      BLOOD RIGHT HAND Performed at [facility], [address], [place], [place] [address]    Special Requests      BOTTLES DRAWN AEROBIC AND ANAEROBIC Blood Culture adequate volume Performed at [facility], [address] [place], [place] [address]    Culture  Setup Time      GRAM POSITIVE COCCI Gram Stain Report Called to,Read Back By and Verified With: BONDURANT,R @ 0210 ON 02/10/19 BY JUW ANAEROBIC BOTTLE ONLY GS DONE @ [facility] Performed at [facility], [place] [address],  [place], [place] [address]    Culture GRAM POSITIVE COCCI    Report Status PENDING   SARS Coronavirus 2 [facility] order, Performed in [facility] hospital lab) Nasopharyngeal Nasopharyngeal Swab     Status: None   Collection Time: 02/09/19  2:00 PM   Specimen: Nasopharyngeal Swab  Result Value Ref Range   SARS Coronavirus 2 NEGATIVE NEGATIVE    Comment: (NOTE) If result is NEGATIVE SARS-CoV-2 target nucleic acids are NOT DETECTED. The SARS-CoV-2 RNA is generally detectable in upper and lower  respiratory specimens during the acute phase of infection. The lowest  concentration of SARS-CoV-2 viral copies this assay can detect is 250  copies / mL. A negative result does not preclude SARS-CoV-2 infection  and should not be used as the sole basis for treatment or other  patient management decisions.  A negative result may occur with  improper specimen collection / handling, submission  of specimen other  than nasopharyngeal swab, presence of viral mutation(s) within the  areas targeted by this assay, and inadequate number of viral copies  (<250 copies / mL). A negative result must be combined with clinical  observations, patient history, and epidemiological information. If result is POSITIVE SARS-CoV-2 target nucleic acids are DETECTED. The SARS-CoV-2 RNA is generally detectable in upper and lower  respiratory specimens dur ing the acute phase of infection.  Positive  results are indicative of active infection with SARS-CoV-2.  Clinical  correlation with patient history and other diagnostic information is  necessary to determine patient infection status.  Positive results do  not rule out bacterial infection or co-infection with other viruses. If result is PRESUMPTIVE POSTIVE SARS-CoV-2 nucleic acids MAY BE PRESENT.   A presumptive positive result was obtained on the submitted specimen  and confirmed on repeat testing.  While 2019 novel coronavirus  (SARS-CoV-2) nucleic acids may be present  in the submitted sample  additional confirmatory testing may be necessary for epidemiological  and / or clinical management purposes  to differentiate between  SARS-CoV-2 and other Sarbecovirus currently known to infect humans.  If clinically indicated additional testing with an alternate test  methodology [phone]) is advised. The SARS-CoV-2 RNA is generally  detectable in upper and lower respiratory sp ecimens during the acute  phase of infection. The expected result is Negative. Fact Sheet for Patients:  StrictlyIdeas.no Fact Sheet for Healthcare Providers: BankingDealers.co.za This test is not yet approved or cleared by the [place] FDA and has been authorized for detection and/or diagnosis of SARS-CoV-2 by FDA under an Emergency Use Authorization (EUA).  This EUA will remain in effect (meaning this test can be used) for the duration of the COVID-19 declaration under Section 564(b)(1) of the Act, 21 U.S.C. section 360bbb-3(b)(1), unless the authorization is terminated or revoked sooner. Performed at [facility], [address] [place], [place] [address]   Prepare Pheresed Platelets     Status: None   Collection Time: 02/09/19  2:48 PM  Result Value Ref Range   Unit Number [id number]    Blood Component Type PLTPHER LR1    Unit division 00    Status of Unit ISSUED,FINAL    Transfusion Status      OK TO TRANSFUSE Performed at [facility], [address], [place], [place] [address]    Unit Number [id number]    Blood Component Type PLTPHER LR2    Unit division 00    Status of Unit ISSUED,FINAL    Transfusion Status OK TO TRANSFUSE   Lactic acid, plasma     Status: None   Collection Time: 02/09/19  3:28 PM  Result Value Ref Range   Lactic Acid, Venous 1.0 0.5 - 1.9 mmol/L    Comment: Performed at [facility], [address] [place], [place] [address]  Urinalysis, Routine w reflex microscopic     Status: Abnormal    Collection Time: 02/09/19  3:45 PM  Result Value Ref Range   Color, Urine AMBER (A) YELLOW    Comment: BIOCHEMICALS MAY BE AFFECTED BY COLOR   APPearance CLEAR CLEAR   Specific Gravity, Urine 1.024 1.005 - 1.030   pH 5.0 5.0 - 8.0   Glucose, UA NEGATIVE NEGATIVE mg/dL   Hgb urine dipstick SMALL (A) NEGATIVE   Bilirubin Urine NEGATIVE NEGATIVE   Ketones, ur NEGATIVE NEGATIVE mg/dL   Protein, ur 100 (A) NEGATIVE mg/dL   Nitrite NEGATIVE NEGATIVE   Leukocytes,Ua NEGATIVE NEGATIVE   RBC /  HPF 6-10 0 - 5 RBC/hpf   WBC, UA 0-5 0 - 5 WBC/hpf   Bacteria, UA RARE (A) NONE SEEN   Squamous Epithelial / LPF 0-5 0 - 5   Mucus PRESENT     Comment: Performed at [facility], [address] [place], [place] [address]  Urine culture     Status: None (Preliminary result)   Collection Time: 02/09/19  3:45 PM   Specimen: In/Out Cath Urine  Result Value Ref Range   Specimen Description      IN/OUT CATH URINE Performed at [facility], [address], [place], [place] [address]    Special Requests      Normal Performed at [facility], [address] [place], [place] [address]    Culture      CULTURE REINCUBATED FOR BETTER GROWTH Performed at [facility], [place] [address] [place], [place] [address]    Report Status PENDING   CBC     Status: Abnormal   Collection Time: 02/10/19  5:27 AM  Result Value Ref Range   WBC 0.2 (LL) 4.0 - 10.5 K/uL    Comment: REPEATED TO VERIFY CANCER CENTER CRITICAL VALUE PROTOCOL THIS CRITICAL RESULT HAS VERIFIED AND BEEN CALLED TO JONES,J BY SHERRI HUFFINES ON 10 05 2020 AT 23, AND HAS BEEN READ BACK.     RBC 2.11 (L) 4.22 - 5.81 MIL/uL   Hemoglobin 6.5 (LL) 13.0 - 17.0 g/dL    Comment: REPEATED TO VERIFY THIS CRITICAL RESULT HAS VERIFIED AND BEEN CALLED TO JONES,J BY SHERRI HUFFINES ON 10 05 2020 AT 0710, AND HAS BEEN READ BACK.     HCT 23.1 (L) 39.0 - 52.0 %   MCV 109.5 (H) 80.0 - 100.0 fL   MCH 30.8 26.0 - 34.0 pg   MCHC 28.1 (L) 30.0 - 36.0 g/dL    RDW 19.2 (H) 11.5 - 15.5 %   Platelets 40 (L) 150 - 400 K/uL    Comment: REPEATED TO VERIFY SPECIMEN CHECKED FOR CLOTS Immature Platelet Fraction may be clinically indicated, consider ordering this additional test [id number]    nRBC 0.0 0.0 - 0.2 %    Comment: Performed at [facility] [address] [place], [place] [address]  Basic metabolic panel     Status: Abnormal   Collection Time: 02/10/19  5:27 AM  Result Value Ref Range   Sodium 138 135 - 145 mmol/L   Potassium 3.8 3.5 - 5.1 mmol/L   Chloride 106 98 - 111 mmol/L   CO2 22 22 - 32 mmol/L   Glucose, Bld 101 (H) 70 - 99 mg/dL   BUN 23 8 - 23 mg/dL   Creatinine, Ser 1.04 0.61 - 1.24 mg/dL   Calcium 8.2 (L) 8.9 - 10.3 mg/dL   GFR calc non Af Amer >60 >60 mL/min   GFR calc Af Amer >60 >60 mL/min   Anion gap 10 5 - 15    Comment: Performed at [facility], [address] [place], [place] [address]  Culture, blood (single)     Status: None (Preliminary result)   Collection Time: 02/10/19  5:27 AM   Specimen: Porta Cath; Blood  Result Value Ref Range   Specimen Description PORTA CATH BOTTLES DRAWN AEROBIC AND ANAEROBIC    Special Requests Blood Culture adequate volume    Culture      NO GROWTH <12 HOURS Performed at [facility], [address] [place],  [address]    Report Status PENDING   CBC  with Differential/Platelet     Status: Abnormal   Collection Time: 02/10/19  8:45 AM  Result Value Ref Range   WBC 0.6 (LL) 4.0 - 10.5 K/uL    Comment: REPEATED TO VERIFY WHITE COUNT CONFIRMED ON SMEAR THIS CRITICAL RESULT HAS VERIFIED AND BEEN CALLED TO AANDERSON BY HILLARY FLYNT ON 10 05 2020 AT 0924, AND HAS BEEN READ BACK.  CRITICAL CALLED TO JACKIE JONES AT 8182 BY HFLYNT 02/07/19 CORRECTED ON 10/05 AT 9937: PREVIOUSLY REPORTED AS 0.6 REPEATED TO VERIFY WHITE COUNT CONFIRMED ON SMEAR THIS CRITICAL RESULT HAS VERIFIED AND BEEN CALLED TO AANDERSON BY HILLARY FLYNT ON 10 05 2020 AT 0924, AND HAS BEEN READ BACK.     RBC  2.34 (L) 4.22 - 5.81 MIL/uL   Hemoglobin 7.2 (L) 13.0 - 17.0 g/dL   HCT 25.8 (L) 39.0 - 52.0 %   MCV 110.3 (H) 80.0 - 100.0 fL   MCH 30.8 26.0 - 34.0 pg   MCHC 27.9 (L) 30.0 - 36.0 g/dL   RDW 19.5 (H) 11.5 - 15.5 %   Platelets 40 (L) 150 - 400 K/uL    Comment: PLATELET COUNT CONFIRMED BY SMEAR SPECIMEN CHECKED FOR CLOTS Immature Platelet Fraction may be clinically indicated, consider ordering this additional test [id number]    nRBC 3.4 (H) 0.0 - 0.2 %   Neutrophils Relative % 2 %   Neutro Abs 0.0 (L) 1.7 - 7.7 K/uL   Lymphocytes Relative 98 %   Lymphs Abs 0.6 (L) 0.7 - 4.0 K/uL   Monocytes Relative 0 %   Monocytes Absolute 0.0 (L) 0.1 - 1.0 K/uL   Eosinophils Relative 0 %   Eosinophils Absolute 0.0 0.0 - 0.5 K/uL   Basophils Relative 0 %   Basophils Absolute 0.0 0.0 - 0.1 K/uL   RBC Morphology MACROCYTOSIS AND ANISOCYTOSIS    Immature Granulocytes 0 %   Abs Immature Granulocytes 0.00 0.00 - 0.07 K/uL    Comment: Performed at [facility], [address] [place], [place] [address]  Prepare RBC     Status: None   Collection Time: 02/10/19  8:46 AM  Result Value Ref Range   Order Confirmation      ORDER PROCESSED BY BLOOD BANK Performed at [facility], [address], [place], [place] [address]   Type and screen     Status: None (Preliminary result)   Collection Time: 02/10/19 10:09 AM  Result Value Ref Range   ABO/RH(D) O POS    Antibody Screen NEG    Sample Expiration      02/13/2019,2359 Performed at [facility], [address], [place], [place] [address]    Unit Number [id number]    Blood Component Type RED CELLS,LR    Unit division 00    Status of Unit ALLOCATED    Transfusion Status OK TO TRANSFUSE    Crossmatch Result Compatible       RADIOGRAPHY: Dg Chest Port 1 View  Result Date: 02/09/2019 CLINICAL DATA:  Fever and cough. Patient has leukemia receiving chemotherapy. EXAM: PORTABLE CHEST 1 VIEW COMPARISON:  08/28/2018 FINDINGS: Right subclavian  Port-A-Cath unchanged. Lungs are adequately inflated without focal airspace consolidation or effusion. Cardiomediastinal silhouette and remainder the exam is unchanged. IMPRESSION: No active disease. Electronically Signed   By: Marin Olp M.D.   On: 02/09/2019 14:26       ASSESSMENT and PLAN:  1.  Neutropenic fever: - Fevers ranging from 101-103 with chills.  Blood culture showed gram-positive cocci, consistent with Streptococcus species. -  He was started on Vanco and cefepime.  Antibiotics changed to ceftriaxone 2 g IV every 24 hours. - Given his severe neutropenia, I have recommended G-CSF 480 mcg daily until [place] is more than 1000.  2.  Acute myeloid leukemia: - Cycle 1 of decitabine and venetoclax on 01/20/2019. -Venetoclax held on 02/03/2023 platelet count of 9000 and rectal bleeding. -He does report some mucositis with burning sensation on the tongue from venetoclax. -Venetoclax will be continued to be held at this time.  3.  Thrombocytopenia: - He presented with severely low platelet count of 7 on admission.  He did receive 3 units of platelets.  Platelet count today is 40. -Thrombocytopenia from combination of bone marrow suppression from chemotherapy and consumption from sepsis. - If there is active bleeding, consider transfusion to keep platelet count around 50 K.  Otherwise transfuse when platelet count is below 15 K.  All questions were answered. The patient knows to call the clinic with any problems, questions or concerns. We can certainly see the patient much sooner if necessary.    [name]

## 2019-02-11 LAB — CBC WITH DIFFERENTIAL/PLATELET
Abs Immature Granulocytes: 0.01 10*3/uL (ref 0.00–0.07)
Basophils Absolute: 0 10*3/uL (ref 0.0–0.1)
Basophils Relative: 0 %
Eosinophils Absolute: 0 10*3/uL (ref 0.0–0.5)
Eosinophils Relative: 0 %
HCT: 23.5 % — ABNORMAL LOW (ref 39.0–52.0)
Hemoglobin: 6.7 g/dL — CL (ref 13.0–17.0)
Immature Granulocytes: 3 %
Lymphocytes Relative: 94 %
Lymphs Abs: 0.3 10*3/uL — ABNORMAL LOW (ref 0.7–4.0)
MCH: 30.3 pg (ref 26.0–34.0)
MCHC: 28.5 g/dL — ABNORMAL LOW (ref 30.0–36.0)
MCV: 106.3 fL — ABNORMAL HIGH (ref 80.0–100.0)
Monocytes Absolute: 0 10*3/uL — ABNORMAL LOW (ref 0.1–1.0)
Monocytes Relative: 0 %
Neutro Abs: 0 10*3/uL — ABNORMAL LOW (ref 1.7–7.7)
Neutrophils Relative %: 3 %
Platelets: 27 10*3/uL — CL (ref 150–400)
RBC: 2.21 MIL/uL — ABNORMAL LOW (ref 4.22–5.81)
RDW: 18.8 % — ABNORMAL HIGH (ref 11.5–15.5)
WBC: 0.4 10*3/uL — CL (ref 4.0–10.5)
nRBC: 0 % (ref 0.0–0.2)

## 2019-02-11 LAB — BASIC METABOLIC PANEL
Anion gap: 5 (ref 5–15)
BUN: 24 mg/dL — ABNORMAL HIGH (ref 8–23)
CO2: 24 mmol/L (ref 22–32)
Calcium: 8.5 mg/dL — ABNORMAL LOW (ref 8.9–10.3)
Chloride: 107 mmol/L (ref 98–111)
Creatinine, Ser: 1.02 mg/dL (ref 0.61–1.24)
GFR calc Af Amer: >60 mL/min (ref >60)
GFR calc non Af Amer: >60 mL/min (ref >60)
Glucose, Bld: 103 mg/dL — ABNORMAL HIGH (ref 70–99)
Potassium: 4 mmol/L (ref 3.5–5.1)
Sodium: 136 mmol/L (ref 135–145)

## 2019-02-11 LAB — URINE CULTURE
Culture: 10000 — AB
Special Requests: NORMAL

## 2019-02-11 MED ORDER — ACETAMINOPHEN 500 MG PO TABS
1000.0000 mg | ORAL_TABLET | Freq: Four times a day (QID) | ORAL | Status: DC | PRN
  Administered 2019-02-11 – 2019-02-15 (×13): 1000 mg via ORAL
  Filled 2019-02-11 (×14): qty 2

## 2019-02-11 MED ORDER — DIPHENHYDRAMINE HCL 25 MG PO CAPS
25.0000 mg | ORAL_CAPSULE | Freq: Four times a day (QID) | ORAL | Status: AC | PRN
  Administered 2019-02-11 – 2019-02-15 (×3): 25 mg via ORAL
  Filled 2019-02-11 (×3): qty 1

## 2019-02-11 MED ORDER — SODIUM CHLORIDE 0.9% IV SOLUTION: Freq: Once | INTRAVENOUS | Status: DC

## 2019-02-11 MED ORDER — GUAIFENESIN-DM 100-10 MG/5ML PO SYRP
5.0000 mL | ORAL_SOLUTION | ORAL | Status: DC | PRN
  Administered 2019-02-11 – 2019-02-17 (×4): 5 mL via ORAL
  Filled 2019-02-11 (×4): qty 5

## 2019-02-11 MED ORDER — ACETAMINOPHEN 325 MG PO TABS: 650.0000 mg | ORAL_TABLET | Freq: Once | ORAL | Status: DC

## 2019-02-11 MED ORDER — ACETAMINOPHEN 500 MG PO TABS
1000.0000 mg | ORAL_TABLET | Freq: Once | ORAL | Status: AC
  Administered 2019-02-11: 1000 mg via ORAL

## 2019-02-11 MED ORDER — MAGIC MOUTHWASH W/LIDOCAINE
15.0000 mL | Freq: Four times a day (QID) | ORAL | Status: DC | PRN
  Administered 2019-02-11: 15 mL via ORAL
  Filled 2019-02-11 (×2): qty 15

## 2019-02-11 NOTE — [place] Notes (Addendum)
Temp 101.8, administered prn tylenol, temp 100.8 when re-assessing. Pt concerned about cough getting worse and wanted "something for it" No coughing noted at this time and lungs clear to auscultation. Notified mid level to make aware. Ok to give standing orders for coughing as needed. Pt. snoring/asleep at this time, will continue to monitor.

## 2019-02-11 NOTE — [place] Notes (Signed)
CRITICAL VALUE ALERT  Critical Value:  WBC-0.4 Hemoglogbin-6.7 Platelets-27  Date & Time Notied: 02/11/19 @0645   Provider Notified: Dr. Olevia Bowens  Orders Received/Actions taken: Awaiting orders

## 2019-02-11 NOTE — Progress Notes (Signed)
PROGRESS NOTE    Richard Wallace  [id number] DOB: June 02, 1945 DOA: 02/09/2019 PCP: [name], MD     Brief Narrative:  73 y.o. male with medical history significant of active chemotherapy for AML, last tx 2 weeks ago, CKD, GERD, HTN, atrophy of kidney and dc from here 5 days ago where he was admitted for rectal bleeding and severe thrombocytopenia (9000) now presenting with fever to 104 since last night w/o sweats but with rigors, mild nonproductive cough (denies sob) and sore throat. He endorses nausea but this is a chronic finding, not new. He denies diarrhea but has had some loose stools. Denies mucus or blood in the stool currently.. Denies chest pain, headache, abdominal pain, dysuria. He denies unusual bruising or petechiae. No further rectal bleeding since the day of dc from here 9/29. He reports feeling profoundly weak without dizziness, no focal weakness.    Assessment & Plan: 1-neutropenic fever -Patient absolute neutrophil 0.0 -Blood cultures positive for Streptococcus species bacteremia -Antibiotic has been narrowed to cover appropriately -Follow speciation and sensitivity -Continue supportive care -Patient continues spiking fever -After discussing with oncology service will provide Neulasta subcutaneously until absolute neutrophils above 4000 (currently 0.0) . -Follow clinical response.  2-leukemia/MDS -Continue follow-up with oncology service -Patient actively receiving chemotherapy as an outpatient. -Holding chemotherapeutic agents at this time.  3-pancytopenia -Patient received 3 units of platelets on admission, as his platelet level was 7000.  Platelets count subsequently went up to 40,000 range and are currently in the 27,000 range.  No transfusion unless overt bleeding or platelets count less than 15,000. -No signs of overt bleeding. -Hemoglobin of 6.7; will transfuse 1 unit of PRBCs and follow hemoglobin trend.   -Leukopenia; treatment as mentioned  above.  4-essential hypertension -Currently not taking any medication as an outpatient -Continue to monitor vital signs and initiate antihypertensive management if needed. -Continue heart healthy diet.  5-allergy rhinitis -Continue the use of loratadine.  6-Staphylococcus lugdunensis in his urine -Patient denies dysuria -No treatment will be provided for this isolated microorganism currently as per ID recommendations.  DVT prophylaxis: SCDs Code Status: Full code Family Communication: Wife at bedside Disposition Plan: Remains inpatient, started on Neupogen subcutaneously as recommended by oncology service; continue IV antibiotics (narrowing based on culture results to the use of Rocephin), follow final culture results.  Discontinue contact precautions and stop C Diff testing.  Consultants:   Oncology service  Infectious disease (Dr. Linus Salmons)  Procedures:   See below for x-ray report.  Antimicrobials:  Anti-infectives (From admission, onward)   Start     Dose/Rate Route Frequency Ordered Stop   02/10/19 1600  vancomycin (VANCOCIN) 1,500 mg in sodium chloride 0.9 % 500 mL IVPB  Status:  Discontinued     1,500 mg 250 mL/hr over 120 Minutes Intravenous Every 24 hours 02/09/19 1936 02/10/19 0913   02/10/19 1200  cefTRIAXone (ROCEPHIN) 2 g in sodium chloride 0.9 % 100 mL IVPB     2 g 200 mL/hr over 30 Minutes Intravenous Every 24 hours 02/10/19 0913     02/09/19 2200  ceFEPIme (MAXIPIME) 2 g in sodium chloride 0.9 % 100 mL IVPB  Status:  Discontinued     2 g 200 mL/hr over 30 Minutes Intravenous Every 8 hours 02/09/19 1925 02/10/19 0913   02/09/19 1745  vancomycin (VANCOCIN) 1,500 mg in sodium chloride 0.9 % 500 mL IVPB  Status:  Discontinued     1,500 mg 250 mL/hr over 120 Minutes Intravenous  Once 02/09/19 1733 02/09/19  1734   02/09/19 1745  vancomycin (VANCOCIN) IVPB 1000 mg/200 mL premix     1,000 mg 100 mL/hr over 120 Minutes Intravenous  Once 02/09/19 1734 02/09/19 2019    02/09/19 1345  vancomycin (VANCOCIN) IVPB 1000 mg/200 mL premix     1,000 mg 200 mL/hr over 60 Minutes Intravenous  Once 02/09/19 1331 02/09/19 1513   02/09/19 1345  ceFEPIme (MAXIPIME) 2 g in sodium chloride 0.9 % 100 mL IVPB     2 g 200 mL/hr over 30 Minutes Intravenous  Once 02/09/19 1331 02/09/19 1435       Subjective: Patient reports intermittent nonproductive coughing spells; no shortness of breath, no chest pain, no nausea, no vomiting, no abdominal pain, no diarrhea.  Continue to spike fever (even fever curve is improving).  Expressed feeling slightly better today.  Objective: Vitals:   02/10/19 2345 02/11/19 0627 02/11/19 0629 02/11/19 1113  BP:  (!) 147/84    Pulse:  95    Resp:  20    Temp: (!) 100.8 F (38.2 C)  (!) 100.4 F (38 C) (!) 101.4 F (38.6 C)  TempSrc: Oral  Oral Oral  SpO2:      Weight:      Height:        Intake/Output Summary (Last 24 hours) at 02/11/2019 1159 Last data filed at 02/11/2019 0840 Gross per 24 hour  Intake 580 ml  Output -  Net 580 ml   Filed Weights   02/09/19 1202  Weight: 102.1 kg    Examination: General exam: Alert, awake, oriented x 3; reports feeling slightly better.  Still spiking fever (even fever curve improving).  No chest pain, no shortness of breath. Patient reports no productive intermittent coughing.  No diarrhea, no nausea, no vomiting, no abdominal pain. Respiratory system: Clear to auscultation. Respiratory effort normal. Cardiovascular system:RRR. No murmurs, rubs, gallops. Gastrointestinal system: Abdomen is nondistended, soft and nontender. No organomegaly or masses felt. Normal bowel sounds heard. Central nervous system: Alert and oriented. No focal neurological deficits. Extremities: No cyanosis or clubbing; trace edema appreciated bilaterally. Skin: No rashes, lesions or ulcers Psychiatry: Judgement and insight appear normal. Mood & affect appropriate.    Data Reviewed: I have personally reviewed  following labs and imaging studies  CBC: Recent Labs  Lab 02/09/19 1330 02/10/19 0527 02/10/19 0845 02/11/19 0542  WBC 0.4* 0.2* 0.6* 0.4*  NEUTROABS 0.0*  --  0.0* 0.0*  HGB 8.8* 6.5* 7.2* 6.7*  HCT 31.9* 23.1* 25.8* 23.5*  MCV 108.9* 109.5* 110.3* 106.3*  PLT 7* 40* 40* 27*   Basic Metabolic Panel: Recent Labs  Lab 02/09/19 1330 02/10/19 0527 02/11/19 0542  NA 136 138 136  K 4.1 3.8 4.0  CL 102 106 107  CO2 23 22 24   GLUCOSE 116* 101* 103*  BUN 28* 23 24*  CREATININE 1.32* 1.04 1.02  CALCIUM 9.1 8.2* 8.5*   GFR: Estimated Creatinine Clearance: 75.5 mL/min (by C-G formula based on SCr of 1.02 mg/dL). Liver Function Tests: Recent Labs  Lab 02/09/19 1330  AST 21  ALT 24  ALKPHOS 55  BILITOT 3.1*  PROT 7.0  ALBUMIN 3.9   Coagulation Profile: Recent Labs  Lab 02/09/19 1330  INR 1.1   Urine analysis:    Component Value Date/Time   COLORURINE AMBER (A) 02/09/2019 1545   APPEARANCEUR CLEAR 02/09/2019 1545   LABSPEC 1.024 02/09/2019 1545   PHURINE 5.0 02/09/2019 1545   GLUCOSEU NEGATIVE 02/09/2019 1545   HGBUR SMALL (A) 02/09/2019 1545  BILIRUBINUR NEGATIVE 02/09/2019 [place] 02/09/2019 1545   PROTEINUR 100 (A) 02/09/2019 1545   UROBILINOGEN 1.0 08/24/2014 1630   NITRITE NEGATIVE 02/09/2019 1545   LEUKOCYTESUR NEGATIVE 02/09/2019 1545    Recent Results (from the past 240 hour(s))  SARS Coronavirus 2 [facility] order, Performed in [facility] hospital lab) Nasopharyngeal Nasopharyngeal Swab     Status: None   Collection Time: 02/03/19 12:31 PM   Specimen: Nasopharyngeal Swab  Result Value Ref Range Status   SARS Coronavirus 2 NEGATIVE NEGATIVE Final    Comment: (NOTE) If result is NEGATIVE SARS-CoV-2 target nucleic acids are NOT DETECTED. The SARS-CoV-2 RNA is generally detectable in upper and lower  respiratory specimens during the acute phase of infection. The lowest  concentration of SARS-CoV-2 viral copies this assay can  detect is 250  copies / mL. A negative result does not preclude SARS-CoV-2 infection  and should not be used as the sole basis for treatment or other  patient management decisions.  A negative result may occur with  improper specimen collection / handling, submission of specimen other  than nasopharyngeal swab, presence of viral mutation(s) within the  areas targeted by this assay, and inadequate number of viral copies  (<250 copies / mL). A negative result must be combined with clinical  observations, patient history, and epidemiological information. If result is POSITIVE SARS-CoV-2 target nucleic acids are DETECTED. The SARS-CoV-2 RNA is generally detectable in upper and lower  respiratory specimens dur ing the acute phase of infection.  Positive  results are indicative of active infection with SARS-CoV-2.  Clinical  correlation with patient history and other diagnostic information is  necessary to determine patient infection status.  Positive results do  not rule out bacterial infection or co-infection with other viruses. If result is PRESUMPTIVE POSTIVE SARS-CoV-2 nucleic acids MAY BE PRESENT.   A presumptive positive result was obtained on the submitted specimen  and confirmed on repeat testing.  While 2019 novel coronavirus  (SARS-CoV-2) nucleic acids may be present in the submitted sample  additional confirmatory testing may be necessary for epidemiological  and / or clinical management purposes  to differentiate between  SARS-CoV-2 and other Sarbecovirus currently known to infect humans.  If clinically indicated additional testing with an alternate test  methodology [phone]) is advised. The SARS-CoV-2 RNA is generally  detectable in upper and lower respiratory sp ecimens during the acute  phase of infection. The expected result is Negative. Fact Sheet for Patients:  StrictlyIdeas.no Fact Sheet for Healthcare Providers:  BankingDealers.co.za This test is not yet approved or cleared by the [place] FDA and has been authorized for detection and/or diagnosis of SARS-CoV-2 by FDA under an Emergency Use Authorization (EUA).  This EUA will remain in effect (meaning this test can be used) for the duration of the COVID-19 declaration under Section 564(b)(1) of the Act, 21 U.S.C. section 360bbb-3(b)(1), unless the authorization is terminated or revoked sooner. Performed at [facility], [address] [place], [place] [address]   MRSA PCR Screening     Status: None   Collection Time: 02/03/19  3:42 PM   Specimen: Nasal Mucosa; Nasopharyngeal  Result Value Ref Range Status   MRSA by PCR NEGATIVE NEGATIVE Final    Comment:        The GeneXpert MRSA Assay (FDA approved for NASAL specimens only), is one component of a comprehensive MRSA colonization surveillance program. It is not intended to diagnose MRSA infection nor to guide or monitor treatment for  MRSA infections. Performed at [facility], [address], [place], [place] [address]   Blood Culture (routine x 2)     Status: None (Preliminary result)   Collection Time: 02/09/19  1:30 PM   Specimen: Right Antecubital; Blood  Result Value Ref Range Status   Specimen Description   Final    RIGHT ANTECUBITAL Performed at [facility], [address] [place], [place] [address]    Special Requests   Final    BOTTLES DRAWN AEROBIC AND ANAEROBIC Blood Culture results may not be optimal due to an excessive volume of blood received in culture bottles Performed at [facility], [address], [place], [place] [address]    Culture  Setup Time   Final    GRAM POSITIVE COCCI BONDURANT,R @ 0210 ON 02/10/19 BY JUW ANAEROBIC BOTTLE ONLY GS DONE @ [facility] CRITICAL RESULT CALLED TO, READ BACK BY AND VERIFIED WITH: PHARMD STEVENS H. [phone] FCP    Culture   Final    GRAM POSITIVE COCCI IDENTIFICATION AND SUSCEPTIBILITIES TO FOLLOW  Performed at [facility], [place] [address], [place], [place] [address]    Report Status PENDING  Incomplete  Blood Culture ID Panel (Reflexed)     Status: Abnormal   Collection Time: 02/09/19  1:30 PM  Result Value Ref Range Status   Enterococcus species NOT DETECTED NOT DETECTED Final   Listeria monocytogenes NOT DETECTED NOT DETECTED Final   Staphylococcus species NOT DETECTED NOT DETECTED Final   Staphylococcus aureus (BCID) NOT DETECTED NOT DETECTED Final   Streptococcus species DETECTED (A) NOT DETECTED Final    Comment: Not Enterococcus species, Streptococcus agalactiae, Streptococcus pyogenes, or Streptococcus pneumoniae. CRITICAL RESULT CALLED TO, READ BACK BY AND VERIFIED WITH: PHARMD STEVENS H. [phone] [id number] FCP    Streptococcus agalactiae NOT DETECTED NOT DETECTED Final   Streptococcus pneumoniae NOT DETECTED NOT DETECTED Final   Streptococcus pyogenes NOT DETECTED NOT DETECTED Final   Acinetobacter baumannii NOT DETECTED NOT DETECTED Final   Enterobacteriaceae species NOT DETECTED NOT DETECTED Final   Enterobacter cloacae complex NOT DETECTED NOT DETECTED Final   Escherichia coli NOT DETECTED NOT DETECTED Final   Klebsiella oxytoca NOT DETECTED NOT DETECTED Final   Klebsiella pneumoniae NOT DETECTED NOT DETECTED Final   Proteus species NOT DETECTED NOT DETECTED Final   Serratia marcescens NOT DETECTED NOT DETECTED Final   Haemophilus influenzae NOT DETECTED NOT DETECTED Final   Neisseria meningitidis NOT DETECTED NOT DETECTED Final   Pseudomonas aeruginosa NOT DETECTED NOT DETECTED Final   Candida albicans NOT DETECTED NOT DETECTED Final   Candida glabrata NOT DETECTED NOT DETECTED Final   Candida krusei NOT DETECTED NOT DETECTED Final   Candida parapsilosis NOT DETECTED NOT DETECTED Final   Candida tropicalis NOT DETECTED NOT DETECTED Final    Comment: Performed at [facility], [place]. [address] [place], [place] [address]  Blood Culture (routine x 2)      Status: None (Preliminary result)   Collection Time: 02/09/19  1:33 PM   Specimen: BLOOD RIGHT HAND  Result Value Ref Range Status   Specimen Description   Final    BLOOD RIGHT HAND Performed at [facility], [address], [place], [place] [address]    Special Requests   Final    BOTTLES DRAWN AEROBIC AND ANAEROBIC Blood Culture adequate volume Performed at [facility], [address], [place], [place] [address]    Culture  Setup Time   Final    [facility] POSITIVE COCCI  Gram Stain Report Called to,Read Back By and Verified With: BONDURANT,R @ [id number] ON 02/10/19 BY JUW ANAEROBIC BOTTLE ONLY GS DONE @ [facility]    Culture   Final    GRAM POSITIVE COCCI IDENTIFICATION AND SUSCEPTIBILITIES TO FOLLOW Performed at [facility], [place] [address], [place], [place] [address]    Report Status PENDING  Incomplete  SARS Coronavirus 2 [facility] order, Performed in [facility] hospital lab) Nasopharyngeal Nasopharyngeal Swab     Status: None   Collection Time: 02/09/19  2:00 PM   Specimen: Nasopharyngeal Swab  Result Value Ref Range Status   SARS Coronavirus 2 NEGATIVE NEGATIVE Final    Comment: (NOTE) If result is NEGATIVE SARS-CoV-2 target nucleic acids are NOT DETECTED. The SARS-CoV-2 RNA is generally detectable in upper and lower  respiratory specimens during the acute phase of infection. The lowest  concentration of SARS-CoV-2 viral copies this assay can detect is 250  copies / mL. A negative result does not preclude SARS-CoV-2 infection  and should not be used as the sole basis for treatment or other  patient management decisions.  A negative result may occur with  improper specimen collection / handling, submission of specimen other  than nasopharyngeal swab, presence of viral mutation(s) within the  areas targeted by this assay, and inadequate number of viral copies  (<250 copies / mL). A negative result must be combined with clinical  observations, patient history, and epidemiological  information. If result is POSITIVE SARS-CoV-2 target nucleic acids are DETECTED. The SARS-CoV-2 RNA is generally detectable in upper and lower  respiratory specimens dur ing the acute phase of infection.  Positive  results are indicative of active infection with SARS-CoV-2.  Clinical  correlation with patient history and other diagnostic information is  necessary to determine patient infection status.  Positive results do  not rule out bacterial infection or co-infection with other viruses. If result is PRESUMPTIVE POSTIVE SARS-CoV-2 nucleic acids MAY BE PRESENT.   A presumptive positive result was obtained on the submitted specimen  and confirmed on repeat testing.  While 2019 novel coronavirus  (SARS-CoV-2) nucleic acids may be present in the submitted sample  additional confirmatory testing may be necessary for epidemiological  and / or clinical management purposes  to differentiate between  SARS-CoV-2 and other Sarbecovirus currently known to infect humans.  If clinically indicated additional testing with an alternate test  methodology [phone]) is advised. The SARS-CoV-2 RNA is generally  detectable in upper and lower respiratory sp ecimens during the acute  phase of infection. The expected result is Negative. Fact Sheet for Patients:  StrictlyIdeas.no Fact Sheet for Healthcare Providers: BankingDealers.co.za This test is not yet approved or cleared by the [place] FDA and has been authorized for detection and/or diagnosis of SARS-CoV-2 by FDA under an Emergency Use Authorization (EUA).  This EUA will remain in effect (meaning this test can be used) for the duration of the COVID-19 declaration under Section 564(b)(1) of the Act, 21 U.S.C. section 360bbb-3(b)(1), unless the authorization is terminated or revoked sooner. Performed at [facility], [address], [place], [place] [address]   Urine culture     Status: Abnormal    Collection Time: 02/09/19  3:45 PM   Specimen: In/Out Cath Urine  Result Value Ref Range Status   Specimen Description   Final    IN/OUT CATH URINE Performed at [facility], [address], [place], [place] [address]    Special Requests   Final    Normal  Performed at [facility], [address], [place], [place] [address]    Culture 10,000 COLONIES/mL STAPHYLOCOCCUS LUGDUNENSIS (A)  Final   Report Status 02/11/2019 FINAL  Final   Organism ID, Bacteria STAPHYLOCOCCUS LUGDUNENSIS (A)  Final      Susceptibility   Staphylococcus lugdunensis - MIC*    CIPROFLOXACIN <=0.5 SENSITIVE Sensitive     GENTAMICIN <=0.5 SENSITIVE Sensitive     NITROFURANTOIN <=16 SENSITIVE Sensitive     OXACILLIN >=4 RESISTANT Resistant     TETRACYCLINE 2 SENSITIVE Sensitive     VANCOMYCIN <=0.5 SENSITIVE Sensitive     TRIMETH/SULFA <=10 SENSITIVE Sensitive     CLINDAMYCIN >=8 RESISTANT Resistant     RIFAMPIN <=0.5 SENSITIVE Sensitive     Inducible Clindamycin NEGATIVE Sensitive     * 10,000 COLONIES/mL STAPHYLOCOCCUS LUGDUNENSIS  Culture, blood (single)     Status: None (Preliminary result)   Collection Time: 02/10/19  5:27 AM   Specimen: Porta Cath; Blood  Result Value Ref Range Status   Specimen Description PORTA CATH BOTTLES DRAWN AEROBIC AND ANAEROBIC  Final   Special Requests Blood Culture adequate volume  Final   Culture   Final    NO GROWTH <12 HOURS Performed at [facility], [address], [place], [place] [address]    Report Status PENDING  Incomplete     Radiology Studies: Dg Chest Port 1 View  Result Date: 02/09/2019 CLINICAL DATA:  Fever and cough. Patient has leukemia receiving chemotherapy. EXAM: PORTABLE CHEST 1 VIEW COMPARISON:  08/28/2018 FINDINGS: Right subclavian Port-A-Cath unchanged. Lungs are adequately inflated without focal airspace consolidation or effusion. Cardiomediastinal silhouette and remainder the exam is unchanged. IMPRESSION: No active disease. Electronically Signed    By: Marin Olp M.D.   On: 02/09/2019 14:26    Scheduled Meds: . sodium chloride   Intravenous Once  . sodium chloride   Intravenous Once  . allopurinol  300 mg Oral QPM  . Chlorhexidine Gluconate Cloth  6 each Topical Daily  . docusate sodium  100 mg Oral QHS  . loratadine  10 mg Oral Daily  . magnesium oxide  400 mg Oral QHS  . Tbo-Filgrastim  480 mcg Subcutaneous Daily   Continuous Infusions: . sodium chloride    . cefTRIAXone (ROCEPHIN)  IV 2 g (02/11/19 1113)     LOS: 2 days    Time spent: 35 minutes.   Barton Dubois, MD [facility] Pager [phone]   02/11/2019, 11:59 AM

## 2019-02-11 NOTE — Consult Note (Signed)
[facility] Oncology Progress Note  Name: Richard Wallace      MRN: [id number]    Location: [id number]  Date: 02/11/2019 Time:10:05 PM   Subjective: Interval History:Richard Wallace was seen today for follow-up of AML and neutropenic fever.  He reportedly had couple of soft bowel movements today.  The first bowel movement was associated with more bleeding than the second BM.  Denies any nausea or vomiting.  Denied any nosebleeds or hematuria.  Had a temperature of 101.4.  Fever improved to 99.9 towards the evening.  Wife is present at bedside.  Objective: Vital signs in last 24 hours: Temp:  [99 F (37.2 C)-101.4 F (38.6 C)] 100.5 F (38.1 C) (10/06 1814) Pulse Rate:  [84-99] 89 (10/06 1814) Resp:  [18-20] 18 (10/06 1814) BP: (129-161)/(69-84) 161/71 (10/06 1814) SpO2:  [99 %-100 %] 100 % (10/06 1949)    Intake/Output from previous day: 10/05 0800 - 10/06 0759 In: 340 [P.O.:240] Out: -     Intake/Output this shift: Total I/O In: 1135 [P.O.:720; Blood:315; IV Piggyback:100] Out: -    PHYSICAL EXAM: BP (!) 166/77 (BP Location: Right Arm)   Pulse 95   Temp (!) 102.1 F (38.9 C) (Oral) Comment: told rn temp up  Resp 16   Ht 5' 9.5" (1.765 m)   Wt 225 lb (102.1 kg)   SpO2 97%   BMI 32.75 kg/m  General appearance: alert, cooperative and appears stated age Lungs: clear to auscultation bilaterally Heart: regular rate and rhythm Abdomen: soft, non-tender; bowel sounds normal; no masses,  no organomegaly Extremities: extremities normal, atraumatic, no cyanosis or edema Skin: Skin color, texture, turgor normal. No rashes or lesions Neurologic: Grossly normal   Studies/Results: Results for orders placed or performed during the hospital encounter of 02/09/19 (from the past 48 hour(s))  CBC     Status: Abnormal   Collection Time: 02/10/19  5:27 AM  Result Value Ref Range   WBC 0.2 (LL) 4.0 - 10.5 K/uL    Comment: REPEATED TO VERIFY [facility] CRITICAL VALUE  PROTOCOL THIS CRITICAL RESULT HAS VERIFIED AND BEEN CALLED TO JONES,J BY SHERRI HUFFINES ON 10 05 2020 AT 0710, AND HAS BEEN READ BACK.     RBC 2.11 (L) 4.22 - 5.81 MIL/uL   Hemoglobin 6.5 (LL) 13.0 - 17.0 g/dL    Comment: REPEATED TO VERIFY THIS CRITICAL RESULT HAS VERIFIED AND BEEN CALLED TO JONES,J BY SHERRI HUFFINES ON 10 05 2020 AT 0710, AND HAS BEEN READ BACK.     HCT 23.1 (L) 39.0 - 52.0 %   MCV 109.5 (H) 80.0 - 100.0 fL   MCH 30.8 26.0 - 34.0 pg   MCHC 28.1 (L) 30.0 - 36.0 g/dL   RDW 19.2 (H) 11.5 - 15.5 %   Platelets 40 (L) 150 - 400 K/uL    Comment: REPEATED TO VERIFY SPECIMEN CHECKED FOR CLOTS Immature Platelet Fraction may be clinically indicated, consider ordering this additional test [id number]    nRBC 0.0 0.0 - 0.2 %    Comment: Performed at [facility], [address] [place], [place] [id number]  Basic metabolic panel     Status: Abnormal   Collection Time: 02/10/19  5:27 AM  Result Value Ref Range   Sodium 138 135 - 145 mmol/L   Potassium 3.8 3.5 - 5.1 mmol/L   Chloride 106 98 - 111 mmol/L   CO2 22 22 - 32 mmol/L   Glucose, Bld 101 (H) 70 - 99 mg/dL   BUN 23  8 - 23 mg/dL   Creatinine, Ser 1.04 0.61 - 1.24 mg/dL   Calcium 8.2 (L) 8.9 - 10.3 mg/dL   GFR calc non Af Amer >60 >60 mL/min   GFR calc Af Amer >60 >60 mL/min   Anion gap 10 5 - 15    Comment: Performed at [facility], [address] [place], [place] [address]  Culture, blood (single)     Status: None (Preliminary result)   Collection Time: 02/10/19  5:27 AM   Specimen: Porta Cath; Blood  Result Value Ref Range   Specimen Description PORTA CATH BOTTLES DRAWN AEROBIC AND ANAEROBIC    Special Requests Blood Culture adequate volume    Culture      NO GROWTH 1 DAY Performed at [facility], [address] [place], [place] [address]    Report Status PENDING   CBC with Differential/Platelet     Status: Abnormal   Collection Time: 02/10/19  8:45 AM  Result Value Ref Range   WBC 0.6 (LL) 4.0 - 10.5  K/uL    Comment: REPEATED TO VERIFY WHITE COUNT CONFIRMED ON SMEAR THIS CRITICAL RESULT HAS VERIFIED AND BEEN CALLED TO AANDERSON BY HILLARY FLYNT ON 10 05 2020 AT 0924, AND HAS BEEN READ BACK.  CRITICAL CALLED TO JACKIE JONES AT [id number] BY HFLYNT 02/07/19 CORRECTED ON 10/05 AT [id number]: PREVIOUSLY REPORTED AS 0.6 REPEATED TO VERIFY WHITE COUNT CONFIRMED ON SMEAR THIS CRITICAL RESULT HAS VERIFIED AND BEEN CALLED TO AANDERSON BY HILLARY FLYNT ON 10 05 2020 AT 0924, AND HAS BEEN READ BACK.     RBC 2.34 (L) 4.22 - 5.81 MIL/uL   Hemoglobin 7.2 (L) 13.0 - 17.0 g/dL   HCT 25.8 (L) 39.0 - 52.0 %   MCV 110.3 (H) 80.0 - 100.0 fL   MCH 30.8 26.0 - 34.0 pg   MCHC 27.9 (L) 30.0 - 36.0 g/dL   RDW 19.5 (H) 11.5 - 15.5 %   Platelets 40 (L) 150 - 400 K/uL    Comment: PLATELET COUNT CONFIRMED BY SMEAR SPECIMEN CHECKED FOR CLOTS Immature Platelet Fraction may be clinically indicated, consider ordering this additional test [id number]    nRBC 3.4 (H) 0.0 - 0.2 %   Neutrophils Relative % 2 %   Neutro Abs 0.0 (L) 1.7 - 7.7 K/uL   Lymphocytes Relative 98 %   Lymphs Abs 0.6 (L) 0.7 - 4.0 K/uL   Monocytes Relative 0 %   Monocytes Absolute 0.0 (L) 0.1 - 1.0 K/uL   Eosinophils Relative 0 %   Eosinophils Absolute 0.0 0.0 - 0.5 K/uL   Basophils Relative 0 %   Basophils Absolute 0.0 0.0 - 0.1 K/uL   RBC Morphology MACROCYTOSIS AND ANISOCYTOSIS    Immature Granulocytes 0 %   Abs Immature Granulocytes 0.00 0.00 - 0.07 K/uL    Comment: Performed at [facility], [address], [place], [place] [address]  Prepare RBC     Status: None   Collection Time: 02/10/19  8:46 AM  Result Value Ref Range   Order Confirmation      ORDER PROCESSED BY BLOOD BANK Performed at [facility], [address], [place], [place] [address]   Type and screen     Status: None (Preliminary result)   Collection Time: 02/10/19 10:09 AM  Result Value Ref Range   ABO/RH(D) O POS    Antibody Screen NEG    Sample Expiration 02/13/2019,2359     Unit Number [id number]    Blood Component Type RED CELLS,LR  Unit division 00    Status of Unit ISSUED    Transfusion Status OK TO TRANSFUSE    Crossmatch Result      Compatible Performed at [facility], [address], [place], [place] [id number]   Basic metabolic panel     Status: Abnormal   Collection Time: 02/11/19  5:42 AM  Result Value Ref Range   Sodium 136 135 - 145 mmol/L   Potassium 4.0 3.5 - 5.1 mmol/L   Chloride 107 98 - 111 mmol/L   CO2 24 22 - 32 mmol/L   Glucose, Bld 103 (H) 70 - 99 mg/dL   BUN 24 (H) 8 - 23 mg/dL   Creatinine, Ser 1.02 0.61 - 1.24 mg/dL   Calcium 8.5 (L) 8.9 - 10.3 mg/dL   GFR calc non Af Amer >60 >60 mL/min   GFR calc Af Amer >60 >60 mL/min   Anion gap 5 5 - 15    Comment: Performed at [facility], [address] [place], [place] [address]  CBC with Differential/Platelet     Status: Abnormal   Collection Time: 02/11/19  5:42 AM  Result Value Ref Range   WBC 0.4 (LL) 4.0 - 10.5 K/uL    Comment: REPEATED TO VERIFY WHITE COUNT CONFIRMED ON SMEAR CRITICAL VALUE NOTED.  VALUE IS CONSISTENT WITH PREVIOUSLY REPORTED AND CALLED VALUE. THIS CRITICAL RESULT HAS VERIFIED AND BEEN CALLED TO GRAVES,K BY SHERRI HUFFINES ON 10 06 2020 AT [id number], AND HAS BEEN READ BACK.     RBC 2.21 (L) 4.22 - 5.81 MIL/uL   Hemoglobin 6.7 (LL) 13.0 - 17.0 g/dL    Comment: REPEATED TO VERIFY THIS CRITICAL RESULT HAS VERIFIED AND BEEN CALLED TO GRAVES,K BY SHERRI HUFFINES ON 10 06 2020 AT 0638, AND HAS BEEN READ BACK.     HCT 23.5 (L) 39.0 - 52.0 %   MCV 106.3 (H) 80.0 - 100.0 fL   MCH 30.3 26.0 - 34.0 pg   MCHC 28.5 (L) 30.0 - 36.0 g/dL   RDW 18.8 (H) 11.5 - 15.5 %   Platelets 27 (LL) 150 - 400 K/uL    Comment: PLATELET COUNT CONFIRMED BY SMEAR SPECIMEN CHECKED FOR CLOTS Immature Platelet Fraction may be clinically indicated, consider ordering this additional test [id number] THIS CRITICAL RESULT HAS VERIFIED AND BEEN CALLED TO GRAVES,K BY SHERRI HUFFINES ON 10 06  2020 AT 0638, AND HAS BEEN READ BACK.     nRBC 0.0 0.0 - 0.2 %   Neutrophils Relative % 3 %   Neutro Abs 0.0 (L) 1.7 - 7.7 K/uL   Lymphocytes Relative 94 %   Lymphs Abs 0.3 (L) 0.7 - 4.0 K/uL   Monocytes Relative 0 %   Monocytes Absolute 0.0 (L) 0.1 - 1.0 K/uL   Eosinophils Relative 0 %   Eosinophils Absolute 0.0 0.0 - 0.5 K/uL   Basophils Relative 0 %   Basophils Absolute 0.0 0.0 - 0.1 K/uL   Immature Granulocytes 3 %   Abs Immature Granulocytes 0.01 0.00 - 0.07 K/uL   Polychromasia PRESENT    Stomatocytes PRESENT     Comment: Performed at [facility], [address] [place], [place] [address]  Culture, blood (Routine X 2) w Reflex to ID Panel     Status: None (Preliminary result)   Collection Time: 02/11/19  2:13 PM   Specimen: BLOOD RIGHT WRIST  Result Value Ref Range   Specimen Description BLOOD RIGHT WRIST    Special Requests      BOTTLES DRAWN  AEROBIC AND ANAEROBIC Blood Culture results may not be optimal due to an excessive volume of blood received in culture bottles Performed at [facility], [address] [place], [place] [address]    Culture PENDING    Report Status PENDING   Culture, blood (Routine X 2) w Reflex to ID Panel     Status: None (Preliminary result)   Collection Time: 02/11/19  2:18 PM   Specimen: BLOOD LEFT ARM  Result Value Ref Range   Specimen Description BLOOD LEFT ARM    Special Requests      BOTTLES DRAWN AEROBIC AND ANAEROBIC Blood Culture results may not be optimal due to an excessive volume of blood received in culture bottles Performed at [facility], [address], [place], [place] [address]    Culture PENDING    Report Status PENDING    No results found.   MEDICATIONS: I have reviewed the patient's current medications.     Assessment/Plan:  1.  Neutropenic fever: - Blood cultures from 02/09/2019 shows Streptococcus viridans. - He is receiving ceftriaxone.  Repeat blood cultures have not grown any. - He is continuing to have  low-grade fevers today. -He will continue G-CSF 480 mcg for his severe neutropenia until [place] is more than 1000.  2.  Anemia and thrombocytopenia: - His hemoglobin dropped to 6.7.  He received 1 unit of PRBC. -His platelets are 27.  If he has bloody bowel movements, will recommend platelet transfusion to keep it close to 50 K.  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 -Venetoclax held on 02/03/2019, as his platelet count was low at 9000. - He did have some mucositis with burning sensation of the tongue which is improving. -We are holding venetoclax at this time.  All questions were answered. The patient knows to call the clinic with any problems, questions or concerns. We can certainly see the patient much sooner if necessary.     [name]

## 2019-02-12 ENCOUNTER — Inpatient Hospital Stay (HOSPITAL_COMMUNITY): Payer: Medicare Other

## 2019-02-12 DIAGNOSIS — I10 Essential (primary) hypertension: Secondary | ICD-10-CM

## 2019-02-12 DIAGNOSIS — R509 Fever, unspecified: Secondary | ICD-10-CM

## 2019-02-12 LAB — CULTURE, BLOOD (ROUTINE X 2): Special Requests: ADEQUATE

## 2019-02-12 LAB — CBC WITH DIFFERENTIAL/PLATELET
Abs Immature Granulocytes: 0 10*3/uL (ref 0.00–0.07)
Basophils Absolute: 0 10*3/uL (ref 0.0–0.1)
Basophils Relative: 0 %
Eosinophils Absolute: 0 10*3/uL (ref 0.0–0.5)
Eosinophils Relative: 0 %
HCT: 25.5 % — ABNORMAL LOW (ref 39.0–52.0)
Hemoglobin: 7.5 g/dL — ABNORMAL LOW (ref 13.0–17.0)
Immature Granulocytes: 0 %
Lymphocytes Relative: 94 %
Lymphs Abs: 0.4 10*3/uL — ABNORMAL LOW (ref 0.7–4.0)
MCH: 30.5 pg (ref 26.0–34.0)
MCHC: 29.4 g/dL — ABNORMAL LOW (ref 30.0–36.0)
MCV: 103.7 fL — ABNORMAL HIGH (ref 80.0–100.0)
Monocytes Absolute: 0 10*3/uL — ABNORMAL LOW (ref 0.1–1.0)
Monocytes Relative: 3 %
Neutro Abs: 0 10*3/uL — ABNORMAL LOW (ref 1.7–7.7)
Neutrophils Relative %: 3 %
Platelets: 17 10*3/uL — CL (ref 150–400)
RBC: 2.46 MIL/uL — ABNORMAL LOW (ref 4.22–5.81)
RDW: 18.8 % — ABNORMAL HIGH (ref 11.5–15.5)
WBC: 0.4 10*3/uL — CL (ref 4.0–10.5)
nRBC: 5.1 % — ABNORMAL HIGH (ref 0.0–0.2)

## 2019-02-12 LAB — TYPE AND SCREEN
ABO/RH(D): O POS
Antibody Screen: NEGATIVE
Unit division: 0

## 2019-02-12 LAB — BPAM RBC
Blood Product Expiration Date: 202010272359
ISSUE DATE / TIME: 202010061447
Unit Type and Rh: 9500

## 2019-02-12 LAB — CREATININE, SERUM
Creatinine, Ser: 0.94 mg/dL (ref 0.61–1.24)
GFR calc Af Amer: >60 mL/min (ref >60)
GFR calc non Af Amer: >60 mL/min (ref >60)

## 2019-02-12 MED ORDER — MAGIC MOUTHWASH W/LIDOCAINE
10.0000 mL | Freq: Four times a day (QID) | ORAL | Status: DC
  Administered 2019-02-12 – 2019-02-18 (×19): 10 mL via ORAL
  Filled 2019-02-12 (×16): qty 10

## 2019-02-12 MED ORDER — SODIUM CHLORIDE 0.9% IV SOLUTION
Freq: Once | INTRAVENOUS | Status: AC
  Administered 2019-02-12: 17:00:00 via INTRAVENOUS

## 2019-02-12 MED ORDER — LIDOCAINE VISCOUS HCL 2 % MT SOLN: 15.0000 mL | Freq: Three times a day (TID) | OROMUCOSAL | Status: DC

## 2019-02-12 MED ORDER — CHLORHEXIDINE GLUCONATE CLOTH 2 % EX PADS: 6.0000 | MEDICATED_PAD | Freq: Once | CUTANEOUS | Status: DC

## 2019-02-12 MED ORDER — IOHEXOL 300 MG/ML  SOLN
100.0000 mL | Freq: Once | INTRAMUSCULAR | Status: AC | PRN
  Administered 2019-02-12: 100 mL via INTRAVENOUS

## 2019-02-12 MED ORDER — IOHEXOL 300 MG/ML  SOLN
30.0000 mL | Freq: Once | INTRAMUSCULAR | Status: AC | PRN
  Administered 2019-02-12: 30 mL via ORAL

## 2019-02-12 NOTE — Care Management Important Message (Signed)
Important Message  Patient Details  Name: Richard Wallace MRN: [id number] Date of Birth: Feb 16, 1946   Medicare Important Message Given:  Yes     Tommy Medal 02/12/2019, 1:31 PM

## 2019-02-12 NOTE — Progress Notes (Signed)
PROGRESS NOTE  Richard Wallace [id number] DOB: 10-11-1945 DOA: 02/09/2019 PCP: [name], MD  Brief History:  73 y.o.malewith medical history significant ofactive chemotherapy for AML, last tx 2 weeks ago, CKD, GERD, HTN, atrophy of kidney and dc from here 5 days ago where he was admitted for rectal bleeding and severe thrombocytopenia (9000) nowpresenting with fever to 104 since last nightw/o sweats but with rigors, mild nonproductive cough (denies sob) and sore throat. He endorses nausea but this is a chronic finding, not new.He denies diarrhea but has had some loose stools. Denies mucus or blood in the stool currently..Denies chest pain, headache, abdominal pain, dysuria. He denies unusual bruising or petechiae. No further rectal bleeding since the day of dc from here 9/29. He reports feeling profoundly weak without dizziness, no focal weakness.  Assessment/Plan: neutropenic fever -Blood cultures positive for Streptococcus species bacteremia -Antibiotic has been narrowed to cover appropriately -Continue supportive care -Patient continues spiking fevers -continue granix -consult surgery to remove port-a-cath -CT chest/abd/pelvis -case discussed with Dr. Delton Coombes  Bacteremia -strep infantarius -with persistent fever, consult surgery to remove port-a-cath  leukemia/MDS -Continue follow-up with oncology service -Patient actively receiving chemotherapy as an outpatient. -Cycle 1 of decitabine and venetoclax on 01/20/2019. -Venetoclax held on 02/03/2019 as his platelet count was low at 9000. -Holding chemotherapeutic agents at this time.  pancytopenia -Patient received 3 units of platelets on admission, as his platelet level was 7000.  Platelets count subsequently went up to 40,000 -transfuse 2 more units platelets -having intermittent rectal bleeding. -Hemoglobin of 6.7; transfused 1 unit of PRBCs 10/6 -Leukopenia; treatment as mentioned above.   essential hypertension -Currently not taking any medication as an outpatient -Continue to monitor vital signs and initiate antihypertensive management if needed.  Staphylococcus lugdunensis bacteruria -Patient denies dysuria -No treatment will be provided for this isolated microorganism currently as per ID recommendations.  Mucositis  -start magic mouthwash with lidocaine     Disposition Plan:  Not stable for d/c  Family Communication:   Wife updated at bedside 10/7  Consultants:  Med/onc; general surgery  Code Status:  FULL  DVT Prophylaxis:  SCDs   Procedures: As Listed in Progress Note Above  Antibiotics: Ceftriaxone 10/5>>>       Subjective: Pt complains of mouth pain.  Patient denies  headache, chest pain, dyspnea, nausea, vomiting, diarrhea, abdominal pain, dysuria, hematuria,  and melena.   Objective: Vitals:   02/12/19 1233 02/12/19 1600 02/12/19 1652 02/12/19 1732  BP: 128/60  (!) 153/75 (!) 149/77  Pulse: 92  (!) 102 98  Resp: 18  16 16   Temp: 100 F (37.8 C) (!) 102.8 F (39.3 C) (!) 102.7 F (39.3 C) (!) 102.9 F (39.4 C)  TempSrc: Oral Oral Oral Oral  SpO2: 96%  98%   Weight:      Height:        Intake/Output Summary (Last 24 hours) at 02/12/2019 1746 Last data filed at 02/12/2019 0910 Gross per 24 hour  Intake 555 ml  Output 200 ml  Net 355 ml   Weight change:  Exam:   General:  Pt is alert, follows commands appropriately, not in acute distress  HEENT: No icterus, No thrush, No neck mass, [place]/AT  Cardiovascular: RRR, S1/S2, no rubs, no gallops  Respiratory: CTA bilaterally, no wheezing, no crackles, no rhonchi  Abdomen: Soft/+BS, non tender, non distended, no guarding  Extremities: No edema, No lymphangitis, No petechiae, No rashes, no synovitis  Data Reviewed: I have personally reviewed following labs and imaging studies Basic Metabolic Panel: Recent Labs  Lab 02/09/19 1330 02/10/19 0527 02/11/19 0542 02/12/19  0501  NA 136 138 136  --   K 4.1 3.8 4.0  --   CL 102 106 107  --   CO2 23 22 24   --   GLUCOSE 116* 101* 103*  --   BUN 28* 23 24*  --   CREATININE 1.32* 1.04 1.02 0.94  CALCIUM 9.1 8.2* 8.5*  --    Liver Function Tests: Recent Labs  Lab 02/09/19 1330  AST 21  ALT 24  ALKPHOS 55  BILITOT 3.1*  PROT 7.0  ALBUMIN 3.9   No results for input(s): LIPASE, AMYLASE in the last 168 hours. No results for input(s): AMMONIA in the last 168 hours. Coagulation Profile: Recent Labs  Lab 02/09/19 1330  INR 1.1   CBC: Recent Labs  Lab 02/09/19 1330 02/10/19 0527 02/10/19 0845 02/11/19 0542 02/12/19 0501  WBC 0.4* 0.2* 0.6* 0.4* 0.4*  NEUTROABS 0.0*  --  0.0* 0.0* 0.0*  HGB 8.8* 6.5* 7.2* 6.7* 7.5*  HCT 31.9* 23.1* 25.8* 23.5* 25.5*  MCV 108.9* 109.5* 110.3* 106.3* 103.7*  PLT 7* 40* 40* 27* 17*   Cardiac Enzymes: No results for input(s): CKTOTAL, CKMB, CKMBINDEX, TROPONINI in the last 168 hours. BNP: Invalid input(s): POCBNP CBG: No results for input(s): GLUCAP in the last 168 hours. HbA1C: No results for input(s): HGBA1C in the last 72 hours. Urine analysis:    Component Value Date/Time   COLORURINE AMBER (A) 02/09/2019 1545   APPEARANCEUR CLEAR 02/09/2019 1545   LABSPEC 1.024 02/09/2019 1545   PHURINE 5.0 02/09/2019 1545   GLUCOSEU NEGATIVE 02/09/2019 1545   HGBUR SMALL (A) 02/09/2019 1545   BILIRUBINUR NEGATIVE 02/09/2019 1545   KETONESUR NEGATIVE 02/09/2019 1545   PROTEINUR 100 (A) 02/09/2019 1545   UROBILINOGEN 1.0 08/24/2014 1630   NITRITE NEGATIVE 02/09/2019 1545   LEUKOCYTESUR NEGATIVE 02/09/2019 1545   Sepsis Labs: @LABRCNTIP (procalcitonin:4,lacticidven:4) ) Recent Results (from the past 240 hour(s))  SARS Coronavirus 2 [facility] order, Performed in [facility] hospital lab) Nasopharyngeal Nasopharyngeal Swab     Status: None   Collection Time: 02/03/19 12:31 PM   Specimen: Nasopharyngeal Swab  Result Value Ref Range Status   SARS Coronavirus 2  NEGATIVE NEGATIVE Final    Comment: (NOTE) If result is NEGATIVE SARS-CoV-2 target nucleic acids are NOT DETECTED. The SARS-CoV-2 RNA is generally detectable in upper and lower  respiratory specimens during the acute phase of infection. The lowest  concentration of SARS-CoV-2 viral copies this assay can detect is 250  copies / mL. A negative result does not preclude SARS-CoV-2 infection  and should not be used as the sole basis for treatment or other  patient management decisions.  A negative result may occur with  improper specimen collection / handling, submission of specimen other  than nasopharyngeal swab, presence of viral mutation(s) within the  areas targeted by this assay, and inadequate number of viral copies  (<250 copies / mL). A negative result must be combined with clinical  observations, patient history, and epidemiological information. If result is POSITIVE SARS-CoV-2 target nucleic acids are DETECTED. The SARS-CoV-2 RNA is generally detectable in upper and lower  respiratory specimens dur ing the acute phase of infection.  Positive  results are indicative of active infection with SARS-CoV-2.  Clinical  correlation with patient history and other diagnostic information is  necessary to determine patient infection status.  Positive  results do  not rule out bacterial infection or co-infection with other viruses. If result is PRESUMPTIVE POSTIVE SARS-CoV-2 nucleic acids MAY BE PRESENT.   A presumptive positive result was obtained on the submitted specimen  and confirmed on repeat testing.  While 2019 novel coronavirus  (SARS-CoV-2) nucleic acids may be present in the submitted sample  additional confirmatory testing may be necessary for epidemiological  and / or clinical management purposes  to differentiate between  SARS-CoV-2 and other Sarbecovirus currently known to infect humans.  If clinically indicated additional testing with an alternate test  methodology [phone])  is advised. The SARS-CoV-2 RNA is generally  detectable in upper and lower respiratory sp ecimens during the acute  phase of infection. The expected result is Negative. Fact Sheet for Patients:  StrictlyIdeas.no Fact Sheet for Healthcare Providers: BankingDealers.co.za This test is not yet approved or cleared by the [place] FDA and has been authorized for detection and/or diagnosis of SARS-CoV-2 by FDA under an Emergency Use Authorization (EUA).  This EUA will remain in effect (meaning this test can be used) for the duration of the COVID-19 declaration under Section 564(b)(1) of the Act, 21 U.S.C. section 360bbb-3(b)(1), unless the authorization is terminated or revoked sooner. Performed at [facility], [address] [place], [place] [address]   MRSA PCR Screening     Status: None   Collection Time: 02/03/19  3:42 PM   Specimen: Nasal Mucosa; Nasopharyngeal  Result Value Ref Range Status   MRSA by PCR NEGATIVE NEGATIVE Final    Comment:        The GeneXpert MRSA Assay (FDA approved for NASAL specimens only), is one component of a comprehensive MRSA colonization surveillance program. It is not intended to diagnose MRSA infection nor to guide or monitor treatment for MRSA infections. Performed at [facility], [address] [place], [place] [address]   Blood Culture (routine x 2)     Status: Abnormal   Collection Time: 02/09/19  1:30 PM   Specimen: Right Antecubital; Blood  Result Value Ref Range Status   Specimen Description   Final    RIGHT ANTECUBITAL Performed at [facility], [address], [place], [place] [address]    Special Requests   Final    BOTTLES DRAWN AEROBIC AND ANAEROBIC Blood Culture results may not be optimal due to an excessive volume of blood received in culture bottles Performed at [facility], [address], [place], [place] [address]    Culture  Setup Time   Final    GRAM POSITIVE COCCI  BONDURANT,R @ 0210 ON 02/10/19 BY JUW ANAEROBIC BOTTLE ONLY GS DONE @ [facility] CRITICAL RESULT CALLED TO, READ BACK BY AND VERIFIED WITH: PHARMD STEVENS H. [id number] FCP Performed at [facility], [place] [address], [place], [place] [address]    Culture STREPTOCOCCUS INFANTARIUS (A)  Final   Report Status 02/12/2019 FINAL  Final   Organism ID, Bacteria STREPTOCOCCUS INFANTARIUS  Final      Susceptibility   Streptococcus infantarius - MIC*    PENICILLIN <=0.06 SENSITIVE Sensitive     CEFTRIAXONE <=0.12 SENSITIVE Sensitive     ERYTHROMYCIN <=0.12 SENSITIVE Sensitive     LEVOFLOXACIN 4 INTERMEDIATE Intermediate     VANCOMYCIN 0.5 SENSITIVE Sensitive     * STREPTOCOCCUS INFANTARIUS  Blood Culture ID Panel (Reflexed)     Status: Abnormal   Collection Time: 02/09/19  1:30 PM  Result Value Ref Range Status   Enterococcus species NOT DETECTED NOT DETECTED Final  Listeria monocytogenes NOT DETECTED NOT DETECTED Final   Staphylococcus species NOT DETECTED NOT DETECTED Final   Staphylococcus aureus (BCID) NOT DETECTED NOT DETECTED Final   Streptococcus species DETECTED (A) NOT DETECTED Final    Comment: Not Enterococcus species, Streptococcus agalactiae, Streptococcus pyogenes, or Streptococcus pneumoniae. CRITICAL RESULT CALLED TO, READ BACK BY AND VERIFIED WITH: PHARMD STEVENS H. [id number] [id number] FCP    Streptococcus agalactiae NOT DETECTED NOT DETECTED Final   Streptococcus pneumoniae NOT DETECTED NOT DETECTED Final   Streptococcus pyogenes NOT DETECTED NOT DETECTED Final   Acinetobacter baumannii NOT DETECTED NOT DETECTED Final   Enterobacteriaceae species NOT DETECTED NOT DETECTED Final   Enterobacter cloacae complex NOT DETECTED NOT DETECTED Final   Escherichia coli NOT DETECTED NOT DETECTED Final   Klebsiella oxytoca NOT DETECTED NOT DETECTED Final   Klebsiella pneumoniae NOT DETECTED NOT DETECTED Final   Proteus species NOT DETECTED NOT DETECTED Final   Serratia marcescens NOT  DETECTED NOT DETECTED Final   Haemophilus influenzae NOT DETECTED NOT DETECTED Final   Neisseria meningitidis NOT DETECTED NOT DETECTED Final   Pseudomonas aeruginosa NOT DETECTED NOT DETECTED Final   Candida albicans NOT DETECTED NOT DETECTED Final   Candida glabrata NOT DETECTED NOT DETECTED Final   Candida krusei NOT DETECTED NOT DETECTED Final   Candida parapsilosis NOT DETECTED NOT DETECTED Final   Candida tropicalis NOT DETECTED NOT DETECTED Final    Comment: Performed at [facility], [place]. [address] [place], [place] [address]  Blood Culture (routine x 2)     Status: Abnormal   Collection Time: 02/09/19  1:33 PM   Specimen: BLOOD RIGHT HAND  Result Value Ref Range Status   Specimen Description   Final    BLOOD RIGHT HAND Performed at [facility], [address], [place], [place] [address]    Special Requests   Final    BOTTLES DRAWN AEROBIC AND ANAEROBIC Blood Culture adequate volume Performed at [facility], [address], [place], [place] [address]    Culture  Setup Time   Final    GRAM POSITIVE COCCI Gram Stain Report Called to,Read Back By and Verified With: BONDURANT,R @ 0210 ON 02/10/19 BY JUW ANAEROBIC BOTTLE ONLY GS DONE @ [facility]    Culture (A)  Final    STREPTOCOCCUS INFANTARIUS SUSCEPTIBILITIES PERFORMED ON PREVIOUS CULTURE WITHIN THE LAST 5 DAYS. Performed at [facility], [place] [address], [place], [place] [address]    Report Status 02/12/2019 FINAL  Final  SARS Coronavirus 2 [facility] order, Performed in [facility] hospital lab) Nasopharyngeal Nasopharyngeal Swab     Status: None   Collection Time: 02/09/19  2:00 PM   Specimen: Nasopharyngeal Swab  Result Value Ref Range Status   SARS Coronavirus 2 NEGATIVE NEGATIVE Final    Comment: (NOTE) If result is NEGATIVE SARS-CoV-2 target nucleic acids are NOT DETECTED. The SARS-CoV-2 RNA is generally detectable in upper and lower  respiratory specimens during the acute phase of infection. The lowest   concentration of SARS-CoV-2 viral copies this assay can detect is 250  copies / mL. A negative result does not preclude SARS-CoV-2 infection  and should not be used as the sole basis for treatment or other  patient management decisions.  A negative result may occur with  improper specimen collection / handling, submission of specimen other  than nasopharyngeal swab, presence of viral mutation(s) within the  areas targeted by this assay, and inadequate number of viral copies  (<250 copies / mL). A  negative result must be combined with clinical  observations, patient history, and epidemiological information. If result is POSITIVE SARS-CoV-2 target nucleic acids are DETECTED. The SARS-CoV-2 RNA is generally detectable in upper and lower  respiratory specimens dur ing the acute phase of infection.  Positive  results are indicative of active infection with SARS-CoV-2.  Clinical  correlation with patient history and other diagnostic information is  necessary to determine patient infection status.  Positive results do  not rule out bacterial infection or co-infection with other viruses. If result is PRESUMPTIVE POSTIVE SARS-CoV-2 nucleic acids MAY BE PRESENT.   A presumptive positive result was obtained on the submitted specimen  and confirmed on repeat testing.  While 2019 novel coronavirus  (SARS-CoV-2) nucleic acids may be present in the submitted sample  additional confirmatory testing may be necessary for epidemiological  and / or clinical management purposes  to differentiate between  SARS-CoV-2 and other Sarbecovirus currently known to infect humans.  If clinically indicated additional testing with an alternate test  methodology [phone]) is advised. The SARS-CoV-2 RNA is generally  detectable in upper and lower respiratory sp ecimens during the acute  phase of infection. The expected result is Negative. Fact Sheet for Patients:  StrictlyIdeas.no Fact Sheet  for Healthcare Providers: BankingDealers.co.za This test is not yet approved or cleared by the [place] FDA and has been authorized for detection and/or diagnosis of SARS-CoV-2 by FDA under an Emergency Use Authorization (EUA).  This EUA will remain in effect (meaning this test can be used) for the duration of the COVID-19 declaration under Section 564(b)(1) of the Act, 21 U.S.C. section 360bbb-3(b)(1), unless the authorization is terminated or revoked sooner. Performed at [facility], [address], [place], [place] [address]   Urine culture     Status: Abnormal   Collection Time: 02/09/19  3:45 PM   Specimen: In/Out Cath Urine  Result Value Ref Range Status   Specimen Description   Final    IN/OUT CATH URINE Performed at [facility], [address], [place], [place] [address]    Special Requests   Final    Normal Performed at [facility], [address], [place], [place] [address]    Culture 10,000 COLONIES/mL STAPHYLOCOCCUS LUGDUNENSIS (A)  Final   Report Status 02/11/2019 FINAL  Final   Organism ID, Bacteria STAPHYLOCOCCUS LUGDUNENSIS (A)  Final      Susceptibility   Staphylococcus lugdunensis - MIC*    CIPROFLOXACIN <=0.5 SENSITIVE Sensitive     GENTAMICIN <=0.5 SENSITIVE Sensitive     NITROFURANTOIN <=16 SENSITIVE Sensitive     OXACILLIN >=4 RESISTANT Resistant     TETRACYCLINE 2 SENSITIVE Sensitive     VANCOMYCIN <=0.5 SENSITIVE Sensitive     TRIMETH/SULFA <=10 SENSITIVE Sensitive     CLINDAMYCIN >=8 RESISTANT Resistant     RIFAMPIN <=0.5 SENSITIVE Sensitive     Inducible Clindamycin NEGATIVE Sensitive     * 10,000 COLONIES/mL STAPHYLOCOCCUS LUGDUNENSIS  Culture, blood (single)     Status: None (Preliminary result)   Collection Time: 02/10/19  5:27 AM   Specimen: Porta Cath; Blood  Result Value Ref Range Status   Specimen Description PORTA CATH BOTTLES DRAWN AEROBIC AND ANAEROBIC  Final   Special Requests Blood Culture adequate volume   Final   Culture   Final    NO GROWTH 2 DAYS Performed at [facility], [address] [place], [place] [address]    Report Status PENDING  Incomplete  Culture, blood (Routine X 2) w Reflex  to ID Panel     Status: None (Preliminary result)   Collection Time: 02/11/19  2:13 PM   Specimen: BLOOD RIGHT WRIST  Result Value Ref Range Status   Specimen Description BLOOD RIGHT WRIST  Final   Special Requests   Final    BOTTLES DRAWN AEROBIC AND ANAEROBIC Blood Culture results may not be optimal due to an excessive volume of blood received in culture bottles   Culture   Final    NO GROWTH < 24 HOURS Performed at [facility], [address], [place], [place] [address]    Report Status PENDING  Incomplete  Culture, blood (Routine X 2) w Reflex to ID Panel     Status: None (Preliminary result)   Collection Time: 02/11/19  2:18 PM   Specimen: BLOOD LEFT ARM  Result Value Ref Range Status   Specimen Description BLOOD LEFT ARM  Final   Special Requests   Final    BOTTLES DRAWN AEROBIC AND ANAEROBIC Blood Culture results may not be optimal due to an excessive volume of blood received in culture bottles   Culture   Final    NO GROWTH < 24 HOURS Performed at [facility], [address], [place], [place] [address]    Report Status PENDING  Incomplete     Scheduled Meds: . sodium chloride   Intravenous Once  . sodium chloride   Intravenous Once  . allopurinol  300 mg Oral QPM  . docusate sodium  100 mg Oral QHS  . loratadine  10 mg Oral Daily  . magic mouthwash w/lidocaine  10 mL Oral QID  . magnesium oxide  400 mg Oral QHS  . Tbo-Filgrastim  480 mcg Subcutaneous Daily   Continuous Infusions: . sodium chloride    . cefTRIAXone (ROCEPHIN)  IV 2 g (02/12/19 1110)    Procedures/Studies: Dg Chest Port 1 View  Result Date: 02/09/2019 CLINICAL DATA:  Fever and cough. Patient has leukemia receiving chemotherapy. EXAM: PORTABLE CHEST 1 VIEW COMPARISON:  08/28/2018 FINDINGS: Right subclavian  Port-A-Cath unchanged. Lungs are adequately inflated without focal airspace consolidation or effusion. Cardiomediastinal silhouette and remainder the exam is unchanged. IMPRESSION: No active disease. Electronically Signed   By: Marin Olp M.D.   On: 02/09/2019 14:26    Orson Eva, DO  [facility] Pager [phone]  If 7PM-7AM, please contact night-coverage [email or web address] Password [email or web address] 02/12/2019, 5:46 PM   LOS: 3 days

## 2019-02-12 NOTE — Consult Note (Addendum)
Notified of consult.  Will get platelet transfusion tonight and check plt ct in am.  Intend to remove port tomorrow pending plt ct results.  Discussed with Dr. Carles Collet.

## 2019-02-12 NOTE — Consult Note (Signed)
[facility] Oncology Progress Note  Name: Richard Wallace      MRN: [id number]    Location: [id number]  Date: 02/12/2019 Time:5:16 PM   Subjective: Interval History:Richard Wallace seen for follow-up of neutropenic fevers and severe cytopenias.  He also reports mucositis and is not able to eat solid foods today.  He is drinking Ensure.  He is having continuous fevers with chills.  Denies any bowel movements or bleeding per rectum today.  Continues to have some dry cough.  Denies any nausea, vomiting or diarrhea.  Wife is present at bedside.  Objective: Vital signs in last 24 hours: Temp:  [100 F (37.8 C)-102.8 F (39.3 C)] 102.7 F (39.3 C) (10/07 1652) Pulse Rate:  [89-102] 102 (10/07 1652) Resp:  [16-19] 16 (10/07 1652) BP: (126-166)/(56-77) 153/75 (10/07 1652) SpO2:  [96 %-100 %] 98 % (10/07 1652)    Intake/Output from previous day: 10/06 0800 - 10/07 0759 In: 1135 [P.O.:720] Out: 200 [Urine:200]    Intake/Output this shift: Total I/O In: 240 [P.O.:240] Out: -    PHYSICAL EXAM: BP (!) 153/75   Pulse (!) 102   Temp (!) 102.7 F (39.3 C) (Oral)   Resp 16   Ht 5' 9.5" (1.765 m)   Wt 225 lb (102.1 kg)   SpO2 98%   BMI 32.75 kg/m  General appearance: alert, cooperative and appears stated age Lungs: clear to auscultation bilaterally Heart: regular rate and rhythm Abdomen: soft, non-tender; bowel sounds normal; no masses,  no organomegaly Extremities: 1+ edema bilaterally. Skin: Skin color, texture, turgor normal. No rashes or lesions Lymph nodes: Cervical, supraclavicular, and axillary nodes normal. Neurologic: Grossly normal   Studies/Results: Results for orders placed or performed during the hospital encounter of 02/09/19 (from the past 48 hour(s))  Basic metabolic panel     Status: Abnormal   Collection Time: 02/11/19  5:42 AM  Result Value Ref Range   Sodium 136 135 - 145 mmol/L   Potassium 4.0 3.5 - 5.1 mmol/L   Chloride 107 98 - 111 mmol/L   CO2 24  22 - 32 mmol/L   Glucose, Bld 103 (H) 70 - 99 mg/dL   BUN 24 (H) 8 - 23 mg/dL   Creatinine, Ser 1.02 0.61 - 1.24 mg/dL   Calcium 8.5 (L) 8.9 - 10.3 mg/dL   GFR calc non Af Amer >60 >60 mL/min   GFR calc Af Amer >60 >60 mL/min   Anion gap 5 5 - 15    Comment: Performed at [facility], [address], [place], [place] [address]  CBC with Differential/Platelet     Status: Abnormal   Collection Time: 02/11/19  5:42 AM  Result Value Ref Range   WBC 0.4 (LL) 4.0 - 10.5 K/uL    Comment: REPEATED TO VERIFY WHITE COUNT CONFIRMED ON SMEAR CRITICAL VALUE NOTED.  VALUE IS CONSISTENT WITH PREVIOUSLY REPORTED AND CALLED VALUE. THIS CRITICAL RESULT HAS VERIFIED AND BEEN CALLED TO [facility] BY SHERRI HUFFINES ON 10 06 2020 AT [id number], AND HAS BEEN READ BACK.     RBC 2.21 (L) 4.22 - 5.81 MIL/uL   Hemoglobin 6.7 (LL) 13.0 - 17.0 g/dL    Comment: REPEATED TO VERIFY THIS CRITICAL RESULT HAS VERIFIED AND BEEN CALLED TO [facility] BY SHERRI HUFFINES ON 10 06 2020 AT 0638, AND HAS BEEN READ BACK.     HCT 23.5 (L) 39.0 - 52.0 %   MCV 106.3 (H) 80.0 - 100.0 fL   MCH 30.3 26.0 - 34.0 pg  MCHC 28.5 (L) 30.0 - 36.0 g/dL   RDW 18.8 (H) 11.5 - 15.5 %   Platelets 27 (LL) 150 - 400 K/uL    Comment: PLATELET COUNT CONFIRMED BY SMEAR SPECIMEN CHECKED FOR CLOTS Immature Platelet Fraction may be clinically indicated, consider ordering this additional test [id number] THIS CRITICAL RESULT HAS VERIFIED AND BEEN CALLED TO [facility] BY SHERRI HUFFINES ON 10 06 2020 AT 0638, AND HAS BEEN READ BACK.     nRBC 0.0 0.0 - 0.2 %   Neutrophils Relative % 3 %   Neutro Abs 0.0 (L) 1.7 - 7.7 K/uL   Lymphocytes Relative 94 %   Lymphs Abs 0.3 (L) 0.7 - 4.0 K/uL   Monocytes Relative 0 %   Monocytes Absolute 0.0 (L) 0.1 - 1.0 K/uL   Eosinophils Relative 0 %   Eosinophils Absolute 0.0 0.0 - 0.5 K/uL   Basophils Relative 0 %   Basophils Absolute 0.0 0.0 - 0.1 K/uL   Immature Granulocytes 3 %   Abs Immature Granulocytes 0.01 0.00 -  0.07 K/uL   Polychromasia PRESENT    Stomatocytes PRESENT     Comment: Performed at [facility], [address] [place], [place] [address]  Culture, blood (Routine X 2) w Reflex to ID Panel     Status: None (Preliminary result)   Collection Time: 02/11/19  2:13 PM   Specimen: BLOOD RIGHT WRIST  Result Value Ref Range   Specimen Description BLOOD RIGHT WRIST    Special Requests      BOTTLES DRAWN AEROBIC AND ANAEROBIC Blood Culture results may not be optimal due to an excessive volume of blood received in culture bottles   Culture      NO GROWTH < 24 HOURS Performed at [facility], [address], [place], [place] [address]    Report Status PENDING   Culture, blood (Routine X 2) w Reflex to ID Panel     Status: None (Preliminary result)   Collection Time: 02/11/19  2:18 PM   Specimen: BLOOD LEFT ARM  Result Value Ref Range   Specimen Description BLOOD LEFT ARM    Special Requests      BOTTLES DRAWN AEROBIC AND ANAEROBIC Blood Culture results may not be optimal due to an excessive volume of blood received in culture bottles   Culture      NO GROWTH < 24 HOURS Performed at [facility], [address] [place], [place] [address]    Report Status PENDING   Creatinine, serum     Status: None   Collection Time: 02/12/19  5:01 AM  Result Value Ref Range   Creatinine, Ser 0.94 0.61 - 1.24 mg/dL   GFR calc non Af Amer >60 >60 mL/min   GFR calc Af Amer >60 >60 mL/min    Comment: Performed at [facility], [address] [place], [place] [address]  CBC with Differential/Platelet     Status: Abnormal   Collection Time: 02/12/19  5:01 AM  Result Value Ref Range   WBC 0.4 (LL) 4.0 - 10.5 K/uL    Comment: REPEATED TO VERIFY WHITE COUNT CONFIRMED ON SMEAR THIS CRITICAL RESULT HAS VERIFIED AND BEEN CALLED TO [facility] BY SHERRI HUFFINES ON 10 07 2020 AT 0752, AND HAS BEEN READ BACK.     RBC 2.46 (L) 4.22 - 5.81 MIL/uL   Hemoglobin 7.5 (L) 13.0 - 17.0 g/dL   HCT 25.5 (L) 39.0 - 52.0 %    MCV 103.7 (H) 80.0 - 100.0 fL   MCH 30.5  26.0 - 34.0 pg   MCHC 29.4 (L) 30.0 - 36.0 g/dL   RDW 18.8 (H) 11.5 - 15.5 %   Platelets 17 (LL) 150 - 400 K/uL    Comment: SPECIMEN CHECKED FOR CLOTS Immature Platelet Fraction may be clinically indicated, consider ordering this additional test [id number] CONSISTENT WITH PREVIOUS RESULT CRITICAL VALUE NOTED.  VALUE IS CONSISTENT WITH PREVIOUSLY REPORTED AND CALLED VALUE.    nRBC 5.1 (H) 0.0 - 0.2 %   Neutrophils Relative % 3 %   Neutro Abs 0.0 (L) 1.7 - 7.7 K/uL   Lymphocytes Relative 94 %   Lymphs Abs 0.4 (L) 0.7 - 4.0 K/uL   Monocytes Relative 3 %   Monocytes Absolute 0.0 (L) 0.1 - 1.0 K/uL   Eosinophils Relative 0 %   Eosinophils Absolute 0.0 0.0 - 0.5 K/uL   Basophils Relative 0 %   Basophils Absolute 0.0 0.0 - 0.1 K/uL   Immature Granulocytes 0 %   Abs Immature Granulocytes 0.00 0.00 - 0.07 K/uL   Polychromasia PRESENT    Basophilic Stippling PRESENT    Target Cells PRESENT    Stomatocytes PRESENT     Comment: Performed at [facility], [address] [place], [place] [address]   No results found.   MEDICATIONS: I have reviewed the patient's current medications.     Assessment/Plan:  1.  Neutropenic fevers: - Blood cultures from 02/09/2019 showed Streptococcus infantarius.  This was sensitive to ceftriaxone. -However he is continuing to have fevers.  Agree with the plan of port removal after platelet transfusion. -I would also recommend 2D echocardiogram. -Also agree with rescanning chest, abdomen and pelvis. -She will continue G-CSF 480 mcg until ANC improves to more than 1000.  2.  Anemia and thrombocytopenia: - Hemoglobin today 7.5.  Platelet count is 17.  He received 1 unit PRBC yesterday. -No active bleeding today. - He will receive platelets prior to removal of port tomorrow.  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 -Venetoclax held on 02/03/2019 as his platelet count was low at 9000.  4.   Mucositis: - He is using Chloraseptic spray which is not helping much. -I would recommend Maalox with lidocaine every 4-6 hours as needed.  He could use it 15 minutes prior to eating.  All questions were answered. The patient knows to call the clinic with any problems, questions or concerns. We can certainly see the patient much sooner if necessary.     [name]

## 2019-02-13 ENCOUNTER — Encounter (HOSPITAL_COMMUNITY): Payer: Self-pay

## 2019-02-13 ENCOUNTER — Inpatient Hospital Stay (HOSPITAL_COMMUNITY): Payer: Medicare Other

## 2019-02-13 DIAGNOSIS — R7881 Bacteremia: Secondary | ICD-10-CM

## 2019-02-13 DIAGNOSIS — R509 Fever, unspecified: Secondary | ICD-10-CM

## 2019-02-13 DIAGNOSIS — B955 Unspecified streptococcus as the cause of diseases classified elsewhere: Secondary | ICD-10-CM

## 2019-02-13 LAB — CBC WITH DIFFERENTIAL/PLATELET
Abs Immature Granulocytes: 0.01 10*3/uL (ref 0.00–0.07)
Basophils Absolute: 0 10*3/uL (ref 0.0–0.1)
Basophils Relative: 0 %
Eosinophils Absolute: 0 10*3/uL (ref 0.0–0.5)
Eosinophils Relative: 0 %
HCT: 25.4 % — ABNORMAL LOW (ref 39.0–52.0)
Hemoglobin: 7.4 g/dL — ABNORMAL LOW (ref 13.0–17.0)
Immature Granulocytes: 2 %
Lymphocytes Relative: 86 %
Lymphs Abs: 0.5 10*3/uL — ABNORMAL LOW (ref 0.7–4.0)
MCH: 30.5 pg (ref 26.0–34.0)
MCHC: 29.1 g/dL — ABNORMAL LOW (ref 30.0–36.0)
MCV: 104.5 fL — ABNORMAL HIGH (ref 80.0–100.0)
Monocytes Absolute: 0 10*3/uL — ABNORMAL LOW (ref 0.1–1.0)
Monocytes Relative: 5 %
Neutro Abs: 0 10*3/uL — ABNORMAL LOW (ref 1.7–7.7)
Neutrophils Relative %: 7 %
Platelets: 20 10*3/uL — CL (ref 150–400)
RBC: 2.43 MIL/uL — ABNORMAL LOW (ref 4.22–5.81)
RDW: 18 % — ABNORMAL HIGH (ref 11.5–15.5)
WBC: 0.6 10*3/uL — CL (ref 4.0–10.5)
nRBC: 0 % (ref 0.0–0.2)

## 2019-02-13 LAB — RESPIRATORY PANEL BY PCR

## 2019-02-13 LAB — COMPREHENSIVE METABOLIC PANEL
ALT: 44 U/L (ref 0–44)
AST: 29 U/L (ref 15–41)
Albumin: 2.9 g/dL — ABNORMAL LOW (ref 3.5–5.0)
Alkaline Phosphatase: 70 U/L (ref 38–126)
Anion gap: 8 (ref 5–15)
BUN: 26 mg/dL — ABNORMAL HIGH (ref 8–23)
CO2: 27 mmol/L (ref 22–32)
Calcium: 9 mg/dL (ref 8.9–10.3)
Chloride: 102 mmol/L (ref 98–111)
Creatinine, Ser: 1.1 mg/dL (ref 0.61–1.24)
GFR calc Af Amer: >60 mL/min (ref >60)
GFR calc non Af Amer: >60 mL/min (ref >60)
Glucose, Bld: 103 mg/dL — ABNORMAL HIGH (ref 70–99)
Potassium: 4.9 mmol/L (ref 3.5–5.1)
Sodium: 137 mmol/L (ref 135–145)
Total Bilirubin: 1.3 mg/dL — ABNORMAL HIGH (ref 0.3–1.2)
Total Protein: 6.1 g/dL — ABNORMAL LOW (ref 6.5–8.1)

## 2019-02-13 LAB — BPAM PLATELET PHERESIS
Blood Product Expiration Date: 202010081718
ISSUE DATE / TIME: 202010071740
Unit Type and Rh: 5100

## 2019-02-13 LAB — PREPARE PLATELET PHERESIS: Unit division: 0

## 2019-02-13 LAB — MRSA PCR SCREENING: MRSA by PCR: NEGATIVE

## 2019-02-13 LAB — ECHOCARDIOGRAM COMPLETE
Height: 69.5 in
Weight: 3600 oz

## 2019-02-13 MED ORDER — SODIUM CHLORIDE 0.9% IV SOLUTION
Freq: Once | INTRAVENOUS | Status: AC
  Administered 2019-02-13: 09:00:00 via INTRAVENOUS

## 2019-02-13 MED ORDER — VORICONAZOLE 200 MG IV SOLR
400.0000 mg | Freq: Two times a day (BID) | INTRAVENOUS | Status: DC
  Filled 2019-02-13 (×6): qty 400

## 2019-02-13 MED ORDER — VORICONAZOLE 200 MG IV SOLR
600.0000 mg | Freq: Two times a day (BID) | INTRAVENOUS | Status: DC
  Administered 2019-02-13: 600 mg via INTRAVENOUS
  Filled 2019-02-13 (×2): qty 600

## 2019-02-13 MED ORDER — SODIUM CHLORIDE 0.9 % IV SOLN
INTRAVENOUS | Status: DC
  Administered 2019-02-13: 22:00:00 via INTRAVENOUS

## 2019-02-13 NOTE — Consult Note (Signed)
[facility] Oncology Progress Note  Name: Yahya Rosenberg      MRN: [id number]    Location: [id number]  Date: 02/13/2019 Time:6:22 PM   Subjective: Interval History:Jamorris [name] seen on rounds today.  His continuing to have on and off fevers.  He is not able to eat much because of pain in the mouth.  He has used some Magic mouthwash this morning.  He had CT scan of the chest, abdomen and pelvis and echocardiogram done.  Did not have any bowel movement or bleeding.  He did receive platelets today.  Continues to have dry cough, mainly during expiration.  Objective: Vital signs in last 24 hours: Temp:  [98.6 F (37 C)-103.1 F (39.5 C)] 101 F (38.3 C) (10/08 1603) Pulse Rate:  [90-101] 96 (10/08 1603) Resp:  [16-19] 19 (10/08 1603) BP: (103-143)/(56-80) 129/68 (10/08 1603) SpO2:  [93 %-98 %] 98 % (10/08 1603)    Intake/Output from previous day: 10/07 0800 - 10/08 0759 In: 440 [P.O.:240] Out: -     Intake/Output this shift: Total I/O In: 1104 [P.O.:240; I.V.:500; Blood:364] Out: 600 [Urine:600]   PHYSICAL EXAM: BP 129/68 (BP Location: Left Arm)   Pulse 96   Temp (!) 101 F (38.3 C) (Oral)   Resp 19   Ht 5' 9.5" (1.765 m)   Wt 225 lb (102.1 kg)   SpO2 98%   BMI 32.75 kg/m  General appearance: alert, cooperative and appears stated age Lungs: clear to auscultation bilaterally Heart: regular rate and rhythm Abdomen: soft, non-tender; bowel sounds normal; no masses,  no organomegaly Extremities: extremities normal, atraumatic, no cyanosis or edema Skin: Skin color, texture, turgor normal. No rashes or lesions Lymph nodes: Cervical, supraclavicular, and axillary nodes normal. Neurologic: Grossly normal   Studies/Results: Results for orders placed or performed during the hospital encounter of 02/09/19 (from the past 48 hour(s))  Creatinine, serum     Status: None   Collection Time: 02/12/19  5:01 AM  Result Value Ref Range   Creatinine, Ser 0.94 0.61 - 1.24 mg/dL    GFR calc non Af Amer >60 >60 mL/min   GFR calc Af Amer >60 >60 mL/min    Comment: Performed at [facility], [address] [place], [place] [address]  CBC with Differential/Platelet     Status: Abnormal   Collection Time: 02/12/19  5:01 AM  Result Value Ref Range   WBC 0.4 (LL) 4.0 - 10.5 K/uL    Comment: REPEATED TO VERIFY WHITE COUNT CONFIRMED ON SMEAR THIS CRITICAL RESULT HAS VERIFIED AND BEEN CALLED TO [facility],[facility] BY SHERRI HUFFINES ON 10 07 2020 AT 0752, AND HAS BEEN READ BACK.     RBC 2.46 (L) 4.22 - 5.81 MIL/uL   Hemoglobin 7.5 (L) 13.0 - 17.0 g/dL   HCT 25.5 (L) 39.0 - 52.0 %   MCV 103.7 (H) 80.0 - 100.0 fL   MCH 30.5 26.0 - 34.0 pg   MCHC 29.4 (L) 30.0 - 36.0 g/dL   RDW 18.8 (H) 11.5 - 15.5 %   Platelets 17 (LL) 150 - 400 K/uL    Comment: SPECIMEN CHECKED FOR CLOTS Immature Platelet Fraction may be clinically indicated, consider ordering this additional test [id number] CONSISTENT WITH PREVIOUS RESULT CRITICAL VALUE NOTED.  VALUE IS CONSISTENT WITH PREVIOUSLY REPORTED AND CALLED VALUE.    nRBC 5.1 (H) 0.0 - 0.2 %   Neutrophils Relative % 3 %   Neutro Abs 0.0 (L) 1.7 - 7.7 K/uL   Lymphocytes Relative 94 %  Lymphs Abs 0.4 (L) 0.7 - 4.0 K/uL   Monocytes Relative 3 %   Monocytes Absolute 0.0 (L) 0.1 - 1.0 K/uL   Eosinophils Relative 0 %   Eosinophils Absolute 0.0 0.0 - 0.5 K/uL   Basophils Relative 0 %   Basophils Absolute 0.0 0.0 - 0.1 K/uL   Immature Granulocytes 0 %   Abs Immature Granulocytes 0.00 0.00 - 0.07 K/uL   Polychromasia PRESENT    Basophilic Stippling PRESENT    Target Cells PRESENT    Stomatocytes PRESENT     Comment: Performed at [facility], [address] [place], [place] [address]  Prepare Pheresed Platelets     Status: None (Preliminary result)   Collection Time: 02/12/19  3:38 PM  Result Value Ref Range   Unit Number [id number]    Blood Component Type PLTP LR2 PAS    Unit division 00    Status of Unit ISSUED    Transfusion Status       OK TO TRANSFUSE Performed at [facility], [address] [place], [place] [address]    Unit Number [id number]    Blood Component Type PLTP LI1 PAS    Unit division 00    Status of Unit ISSUED    Transfusion Status OK TO TRANSFUSE   Prepare Pheresed Platelets     Status: None   Collection Time: 02/12/19  3:48 PM  Result Value Ref Range   Unit Number [id number]    Blood Component Type PLTP LR3 PAS    Unit division 00    Status of Unit ISSUED,FINAL    Transfusion Status OK TO TRANSFUSE   MRSA PCR Screening     Status: None   Collection Time: 02/12/19  7:38 PM   Specimen: Nasopharyngeal  Result Value Ref Range   MRSA by PCR NEGATIVE NEGATIVE    Comment:        The GeneXpert MRSA Assay (FDA approved for NASAL specimens only), is one component of a comprehensive MRSA colonization surveillance program. It is not intended to diagnose MRSA infection nor to guide or monitor treatment for MRSA infections. Performed at [facility], [address] [place], [place] [address]   CBC with Differential/Platelet     Status: Abnormal   Collection Time: 02/13/19  6:41 AM  Result Value Ref Range   WBC 0.6 (LL) 4.0 - 10.5 K/uL    Comment: WHITE COUNT CONFIRMED ON SMEAR [facility] CRITICAL VALUE PROTOCOL THIS CRITICAL RESULT HAS VERIFIED AND BEEN CALLED TO [facility],B BY SHERRI HUFFINES ON 10 08 2020 AT 0811, AND HAS BEEN READ BACK.  THIS CRITICAL RESULT HAS VERIFIED AND BEEN CALLED TO [facility],B BY SHERRI HUFFINES ON 10 08 2020 AT 0813, AND HAS BEEN READ BACK.     RBC 2.43 (L) 4.22 - 5.81 MIL/uL   Hemoglobin 7.4 (L) 13.0 - 17.0 g/dL   HCT 25.4 (L) 39.0 - 52.0 %   MCV 104.5 (H) 80.0 - 100.0 fL   MCH 30.5 26.0 - 34.0 pg   MCHC 29.1 (L) 30.0 - 36.0 g/dL   RDW 18.0 (H) 11.5 - 15.5 %   Platelets 20 (LL) 150 - 400 K/uL    Comment: PLATELET COUNT CONFIRMED BY SMEAR SPECIMEN CHECKED FOR CLOTS Immature Platelet Fraction may be clinically indicated, consider ordering this additional  test [id number] CRITICAL VALUE NOTED.  VALUE IS CONSISTENT WITH PREVIOUSLY REPORTED AND CALLED VALUE.    nRBC 0.0 0.0 - 0.2 %   Neutrophils Relative % 7 %  Neutro Abs 0.0 (L) 1.7 - 7.7 K/uL   Lymphocytes Relative 86 %   Lymphs Abs 0.5 (L) 0.7 - 4.0 K/uL   Monocytes Relative 5 %   Monocytes Absolute 0.0 (L) 0.1 - 1.0 K/uL   Eosinophils Relative 0 %   Eosinophils Absolute 0.0 0.0 - 0.5 K/uL   Basophils Relative 0 %   Basophils Absolute 0.0 0.0 - 0.1 K/uL   Immature Granulocytes 2 %   Abs Immature Granulocytes 0.01 0.00 - 0.07 K/uL   Reactive, Benign Lymphocytes PRESENT    Polychromasia PRESENT    Basophilic Stippling PRESENT    Target Cells PRESENT    Stomatocytes PRESENT     Comment: Performed at [facility], [address] [place], [place] [address]  Comprehensive metabolic panel     Status: Abnormal   Collection Time: 02/13/19  6:41 AM  Result Value Ref Range   Sodium 137 135 - 145 mmol/L   Potassium 4.9 3.5 - 5.1 mmol/L   Chloride 102 98 - 111 mmol/L   CO2 27 22 - 32 mmol/L   Glucose, Bld 103 (H) 70 - 99 mg/dL   BUN 26 (H) 8 - 23 mg/dL   Creatinine, Ser 1.10 0.61 - 1.24 mg/dL   Calcium 9.0 8.9 - 10.3 mg/dL   Total Protein 6.1 (L) 6.5 - 8.1 g/dL   Albumin 2.9 (L) 3.5 - 5.0 g/dL   AST 29 15 - 41 U/L   ALT 44 0 - 44 U/L   Alkaline Phosphatase 70 38 - 126 U/L   Total Bilirubin 1.3 (H) 0.3 - 1.2 mg/dL   GFR calc non Af Amer >60 >60 mL/min   GFR calc Af Amer >60 >60 mL/min   Anion gap 8 5 - 15    Comment: Performed at [facility], [address] [place], [place] [address]   Ct Chest W Contrast  Result Date: 02/12/2019 CLINICAL DATA:  Fever of unknown origin. Cough with loose stools. AML. EXAM: CT CHEST, ABDOMEN, AND PELVIS WITH CONTRAST TECHNIQUE: Multidetector CT imaging of the chest, abdomen and pelvis was performed following the standard protocol during bolus administration of intravenous contrast. CONTRAST:  161mL OMNIPAQUE IOHEXOL 300 MG/ML SOLN, 12mL OMNIPAQUE  IOHEXOL 300 MG/ML SOLN COMPARISON:  CT urogram 09/29/2014. FINDINGS: CT CHEST FINDINGS Cardiovascular: The heart size is normal. No substantial pericardial effusion. No thoracic aortic aneurysm. Right Port-A-Cath tip is positioned in the mid SVC. Mediastinum/Nodes: No mediastinal lymphadenopathy. There is no hilar lymphadenopathy. The esophagus has normal imaging features. There is no axillary lymphadenopathy. Lungs/Pleura: Numerous irregular bilateral pulmonary nodules are identified. One of the more dominant nodules is seen in the left upper lobe on 35/3, measuring 1.4 cm diameter. Index nodule right upper lobe on 75/3 measures 8 mm. Left lower lobe index nodule on 94/3 is 10 mm. No focal airspace consolidation. No pleural effusion. Musculoskeletal: No worrisome lytic or sclerotic osseous abnormality. CT ABDOMEN PELVIS FINDINGS Hepatobiliary: The liver shows diffusely decreased attenuation suggesting fat deposition. No suspicious focal abnormality within the liver parenchyma. Gallbladder is nondistended. No intrahepatic or extrahepatic biliary dilation. Pancreas: No focal mass lesion. No dilatation of the main duct. No intraparenchymal cyst. No peripancreatic edema. Spleen: No splenomegaly. No focal mass lesion. Adrenals/Urinary Tract: No adrenal nodule or mass. Left kidney is markedly atrophic with 3.6 cm interpolar cystic lesion similar to prior study. 5.7 cm cystic lesion anterior interpolar right kidney was 5.4 cm previously. 14 mm low-density lesion upper interpolar right kidney (69/2) was present previously and  is not substantially changed. 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 8 mm nonobstructing stone identified lower pole right kidney. No evidence for hydroureter. Mild but asymmetric left bladder wall thickening evident (image 112/2. This was probably present on the previous study but less conspicuous. Stomach/Bowel: Stomach is unremarkable. No gastric wall thickening. No evidence of  outlet obstruction. Duodenum is normally positioned as is the ligament of Treitz. Large duodenal diverticulum evident. No small bowel wall thickening. No small bowel dilatation. The terminal ileum is normal. The appendix is normal. No gross colonic mass. No colonic wall thickening. Diverticuli are seen scattered along the entire length of the colon without CT findings of diverticulitis. Vascular/Lymphatic: There is abdominal aortic atherosclerosis without aneurysm. There is no gastrohepatic or hepatoduodenal ligament lymphadenopathy. No intraperitoneal or retroperitoneal lymphadenopathy. No pelvic sidewall lymphadenopathy. Reproductive: Prostate gland is enlarged. Other: No intraperitoneal free fluid. Musculoskeletal: No worrisome lytic or sclerotic osseous abnormality. Status post lumbar fusion. IMPRESSION: 1. Numerous bilateral irregular/spiculated pulmonary nodules measuring up to 14 mm. Imaging features are nonspecific and could be related to an infectious etiology or neoplasm. 2. Mild asymmetric left bladder wall thickening, likely present on the study from over 4 years ago although less conspicuous on that exam due to lack of intravenous contrast administration. 3. Hepatic steatosis. 4. Bilateral renal cysts with 8 mm nonobstructing right renal stone. 5.  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   On: 02/12/2019 20:57   Ct Abdomen Pelvis W Contrast  Result Date: 02/12/2019 CLINICAL DATA:  Fever of unknown origin. Cough with loose stools. AML. EXAM: CT CHEST, ABDOMEN, AND PELVIS WITH CONTRAST TECHNIQUE: Multidetector CT imaging of the chest, abdomen and pelvis was performed following the standard protocol during bolus administration of intravenous contrast. CONTRAST:  185mL OMNIPAQUE IOHEXOL 300 MG/ML SOLN, 2mL OMNIPAQUE IOHEXOL 300 MG/ML SOLN COMPARISON:  CT urogram 09/29/2014. FINDINGS: CT CHEST FINDINGS Cardiovascular: The heart size is normal. No substantial  pericardial effusion. No thoracic aortic aneurysm. Right Port-A-Cath tip is positioned in the mid SVC. Mediastinum/Nodes: No mediastinal lymphadenopathy. There is no hilar lymphadenopathy. The esophagus has normal imaging features. There is no axillary lymphadenopathy. Lungs/Pleura: Numerous irregular bilateral pulmonary nodules are identified. One of the more dominant nodules is seen in the left upper lobe on 35/3, measuring 1.4 cm diameter. Index nodule right upper lobe on 75/3 measures 8 mm. Left lower lobe index nodule on 94/3 is 10 mm. No focal airspace consolidation. No pleural effusion. Musculoskeletal: No worrisome lytic or sclerotic osseous abnormality. CT ABDOMEN PELVIS FINDINGS Hepatobiliary: The liver shows diffusely decreased attenuation suggesting fat deposition. No suspicious focal abnormality within the liver parenchyma. Gallbladder is nondistended. No intrahepatic or extrahepatic biliary dilation. Pancreas: No focal mass lesion. No dilatation of the main duct. No intraparenchymal cyst. No peripancreatic edema. Spleen: No splenomegaly. No focal mass lesion. Adrenals/Urinary Tract: No adrenal nodule or mass. Left kidney is markedly atrophic with 3.6 cm interpolar cystic lesion similar to prior study. 5.7 cm cystic lesion anterior interpolar right kidney was 5.4 cm previously. 14 mm low-density lesion upper interpolar right kidney (69/2) was present previously and is not substantially changed. 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 8 mm nonobstructing stone identified lower pole right kidney. No evidence for hydroureter. Mild but asymmetric left bladder wall thickening evident (image 112/2. This was probably present on the previous study but less conspicuous. Stomach/Bowel: Stomach is unremarkable. No gastric wall thickening. No evidence of outlet obstruction. Duodenum  is normally positioned as is the ligament of Treitz. Large duodenal diverticulum evident. No small bowel wall  thickening. No small bowel dilatation. The terminal ileum is normal. The appendix is normal. No gross colonic mass. No colonic wall thickening. Diverticuli are seen scattered along the entire length of the colon without CT findings of diverticulitis. Vascular/Lymphatic: There is abdominal aortic atherosclerosis without aneurysm. There is no gastrohepatic or hepatoduodenal ligament lymphadenopathy. No intraperitoneal or retroperitoneal lymphadenopathy. No pelvic sidewall lymphadenopathy. Reproductive: Prostate gland is enlarged. Other: No intraperitoneal free fluid. Musculoskeletal: No worrisome lytic or sclerotic osseous abnormality. Status post lumbar fusion. IMPRESSION: 1. Numerous bilateral irregular/spiculated pulmonary nodules measuring up to 14 mm. Imaging features are nonspecific and could be related to an infectious etiology or neoplasm. 2. Mild asymmetric left bladder wall thickening, likely present on the study from over 4 years ago although less conspicuous on that exam due to lack of intravenous contrast administration. 3. Hepatic steatosis. 4. Bilateral renal cysts with 8 mm nonobstructing right renal stone. 5.  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   On: 02/12/2019 20:57     MEDICATIONS: I have reviewed the patient's current medications.     Assessment/Plan:  1.  Neutropenic fevers: - Blood culture from 02/09/2019 showed Streptococcus Infantarius.  This is sensitive to ceftriaxone. -He is continuing to have fevers. - CT CAP on 02/12/2019 reviewed by me showed numerous bilateral irregular pulmonary nodules measuring up to 14 mm.  Abdomen and pelvis did not show any significant pathology. 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  I will obtain serum galactomannan assay. - I will start him on voriconazole with loading dose followed by maintenance. - Would recommend discussing with ID to see if port should be  removed with this particular pathogen. -Would recommend continuation of G-CSF 480 mcg until [place] improves to more than 1000.  2.  Anemia and thrombocytopenia: - Platelet count was 20 today.  He received 1 unit of platelets today.  He could not have a port removed because of low platelets.  Does not have any active bleeding. -Hemoglobin is 7.4 today.  3.  Mucositis: - He is using Magic mouthwash every 4 hours which is helping.  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 -Venetoclax held on 01/26/2019 as his platelet count was low at 9000.    All questions were answered. The patient knows to call the clinic with any problems, questions or concerns. We can certainly see the patient much sooner if necessary.     [name]

## 2019-02-13 NOTE — Progress Notes (Signed)
CRITICAL VALUE ALERT  Critical Value:  WBC 0.6 & Platelets 20K.  Date & Time Notied: 02/13/19 @0845   Provider Notified: Tat, MD & Arnoldo Morale, MD.  Orders Received/Actions taken: Order for more platelet infusions and reschedule port removal for tomorrow.

## 2019-02-13 NOTE — Progress Notes (Signed)
  Echocardiogram 2D Echocardiogram has been performed.  Richard Wallace 02/13/2019, 10:36 AM

## 2019-02-13 NOTE — Progress Notes (Signed)
Platelet count 20,000 this morning.  Will reschedule Port-A-Cath removal tomorrow.  Patient will receive more platelets.  Discussed with Dr. Carles Collet.

## 2019-02-13 NOTE — Plan of Care (Signed)
  Problem: Education: Goal: Knowledge of General Education information will improve Description Including pain rating scale, medication(s)/side effects and non-pharmacologic comfort measures Outcome: Progressing   Problem: Health Behavior/Discharge Planning: Goal: Ability to manage health-related needs will improve Outcome: Progressing   

## 2019-02-13 NOTE — Progress Notes (Signed)
Called lab to check on the status on platelets. They have not arrived yet from outside facility. Will continue to check status.

## 2019-02-13 NOTE — Progress Notes (Addendum)
PROGRESS NOTE  Richard Wallace [id number] DOB: 11/22/1945 DOA: 02/09/2019 PCP: [name], MD  Brief History:  73 y.o.malewith medical history significant ofactive chemotherapy for AML, last tx 2 weeks ago, CKD, GERD, HTN, atrophy of kidney and dc from here 5 days ago where he was admitted for rectal bleeding and severe thrombocytopenia (9000) nowpresenting with fever to 104 since last nightw/o sweats but with rigors, mild nonproductive cough (denies sob) and sore throat. He endorses nausea but this is a chronic finding, not new.He denies diarrhea but has had some loose stools. Denies mucus or blood in the stool currently..Denies chest pain, headache, abdominal pain, dysuria. He denies unusual bruising or petechiae. No further rectal bleeding since the day of dc from here 9/29. He reports feeling profoundly weak without dizziness, no focal weakness.  Assessment/Plan: neutropenic fever -Blood cultures positive for Streptococcus species bacteremia -Antibiotic has been narrowed to cover appropriately -Continue supportive care -Patient continues spiking fevers -continue granix -consult surgery to remove port-a-cath -CT chest/abd/pelvis--no lymphadenopathy.  Numerous bilateral pulmonary nodules; no consolidation, no pleural effusion; no hydronephrosis; no hydroureter; diverticulosis; no retroperitoneal lymphadenopathy -case discussed with Dr. Delton Coombes  Bacteremia -strep infantarius -with persistent fever, consulted surgery to remove port-a-cath -plan port removal 10/9 if platelets closer to 50K  leukemia/MDS -appreciate med/onc follow up -case discussed with Dr. Delton Coombes -Patient actively receiving chemotherapyas an outpatient. -Cycle 1 of decitabine and venetoclax on 01/20/2019. -Venetoclax held on 02/03/2019 as his platelet count was low at 9000. -Holding chemotherapeutic agents at this time.  pancytopenia -Patient received 3 units of plateletson  admission,as his platelet levelwas7000. 10/8--transfuse 2 more units platelets (7 total for admission) -having intermittent rectal bleeding. -Hemoglobin of6.7; transfused 1 unit of PRBCs 02/11/19  essential hypertension -Currently not taking any medication as an outpatient -Continue to monitor vital signs and initiate antihypertensive management if needed.  Staphylococcus lugdunensis bacteruria -Patient denies dysuria -No treatment will be provided for this isolated microorganism currently as per ID recommendations.  Mucositis  -start magic mouthwash with lidocaine  Lung Nodules -incidental findings on CT chest -outpt surveillance     Disposition Plan:  Not stable for d/c  Family Communication:   Wife updated at bedside 10/8  Consultants:  Med/onc; general surgery  Code Status:  FULL  DVT Prophylaxis:  SCDs   Procedures: As Listed in Progress Note Above  Antibiotics: Ceftriaxone 10/5>>>       Subjective: Pt complains of soreness in mouth.  Denies odynophagia.  Denies cp, sob.  Complains of cough, no hemoptysis.  Denies n/v/d, abd pain, dysuria, hematuria.  Objective: Vitals:   02/13/19 1237 02/13/19 1515 02/13/19 1548 02/13/19 1603  BP: (!) 114/56 (!) 143/74 129/71 129/68  Pulse: 90 (!) 101 97 96  Resp: 19 16 18 19   Temp: 98.6 F (37 C) 99.8 F (37.7 C) (!) 101.2 F (38.4 C) (!) 101 F (38.3 C)  TempSrc: Oral Oral Oral Oral  SpO2: 98% 94% 95% 98%  Weight:      Height:        Intake/Output Summary (Last 24 hours) at 02/13/2019 1710 Last data filed at 02/13/2019 1548 Gross per 24 hour  Intake 1304 ml  Output 600 ml  Net 704 ml   Weight change:  Exam:   General:  Pt is alert, follows commands appropriately, not in acute distress  HEENT: No icterus, No thrush, No neck mass, [place]/AT  Cardiovascular: RRR, S1/S2, no rubs, no gallops  Respiratory: CTA bilaterally,  no wheezing, no crackles, no rhonchi  Abdomen: Soft/+BS, non  tender, non distended, no guarding  Extremities: No edema, No lymphangitis, No petechiae, No rashes, no synovitis   Data Reviewed: I have personally reviewed following labs and imaging studies Basic Metabolic Panel: Recent Labs  Lab 02/09/19 1330 02/10/19 0527 02/11/19 0542 02/12/19 0501 02/13/19 0641  NA 136 138 136  --  137  K 4.1 3.8 4.0  --  4.9  CL 102 106 107  --  102  CO2 23 22 24   --  27  GLUCOSE 116* 101* 103*  --  103*  BUN 28* 23 24*  --  26*  CREATININE 1.32* 1.04 1.02 0.94 1.10  CALCIUM 9.1 8.2* 8.5*  --  9.0   Liver Function Tests: Recent Labs  Lab 02/09/19 1330 02/13/19 0641  AST 21 29  ALT 24 44  ALKPHOS 55 70  BILITOT 3.1* 1.3*  PROT 7.0 6.1*  ALBUMIN 3.9 2.9*   No results for input(s): LIPASE, AMYLASE in the last 168 hours. No results for input(s): AMMONIA in the last 168 hours. Coagulation Profile: Recent Labs  Lab 02/09/19 1330  INR 1.1   CBC: Recent Labs  Lab 02/09/19 1330 02/10/19 0527 02/10/19 0845 02/11/19 0542 02/12/19 0501 02/13/19 0641  WBC 0.4* 0.2* 0.6* 0.4* 0.4* 0.6*  NEUTROABS 0.0*  --  0.0* 0.0* 0.0* 0.0*  HGB 8.8* 6.5* 7.2* 6.7* 7.5* 7.4*  HCT 31.9* 23.1* 25.8* 23.5* 25.5* 25.4*  MCV 108.9* 109.5* 110.3* 106.3* 103.7* 104.5*  PLT 7* 40* 40* 27* 17* 20*   Cardiac Enzymes: No results for input(s): CKTOTAL, CKMB, CKMBINDEX, TROPONINI in the last 168 hours. BNP: Invalid input(s): POCBNP CBG: No results for input(s): GLUCAP in the last 168 hours. HbA1C: No results for input(s): HGBA1C in the last 72 hours. Urine analysis:    Component Value Date/Time   COLORURINE AMBER (A) 02/09/2019 1545   APPEARANCEUR CLEAR 02/09/2019 1545   LABSPEC 1.024 02/09/2019 1545   PHURINE 5.0 02/09/2019 1545   GLUCOSEU NEGATIVE 02/09/2019 1545   HGBUR SMALL (A) 02/09/2019 1545   BILIRUBINUR NEGATIVE 02/09/2019 1545   KETONESUR NEGATIVE 02/09/2019 1545   PROTEINUR 100 (A) 02/09/2019 1545   UROBILINOGEN 1.0 08/24/2014 1630   NITRITE  NEGATIVE 02/09/2019 1545   LEUKOCYTESUR NEGATIVE 02/09/2019 1545   Sepsis Labs: @LABRCNTIP (procalcitonin:4,lacticidven:4) ) Recent Results (from the past 240 hour(s))  Blood Culture (routine x 2)     Status: Abnormal   Collection Time: 02/09/19  1:30 PM   Specimen: Right Antecubital; Blood  Result Value Ref Range Status   Specimen Description   Final    RIGHT ANTECUBITAL Performed at [facility], [address] [place], [place] [address]    Special Requests   Final    BOTTLES DRAWN AEROBIC AND ANAEROBIC Blood Culture results may not be optimal due to an excessive volume of blood received in culture bottles Performed at [facility], [address], [place], [place] [address]    Culture  Setup Time   Final    GRAM POSITIVE COCCI BONDURANT,R @ 0210 ON 02/10/19 BY JUW ANAEROBIC BOTTLE ONLY GS DONE @ [facility] CRITICAL RESULT CALLED TO, READ BACK BY AND VERIFIED WITH: PHARMD STEVENS H. [id number] FCP Performed at [facility], [place] [address], [place], [place] [address]    Culture STREPTOCOCCUS INFANTARIUS (A)  Final   Report Status 02/12/2019 FINAL  Final   Organism ID, Bacteria STREPTOCOCCUS INFANTARIUS  Final      Susceptibility  Streptococcus infantarius - MIC*    PENICILLIN <=0.06 SENSITIVE Sensitive     CEFTRIAXONE <=0.12 SENSITIVE Sensitive     ERYTHROMYCIN <=0.12 SENSITIVE Sensitive     LEVOFLOXACIN 4 INTERMEDIATE Intermediate     VANCOMYCIN 0.5 SENSITIVE Sensitive     * STREPTOCOCCUS INFANTARIUS  Blood Culture ID Panel (Reflexed)     Status: Abnormal   Collection Time: 02/09/19  1:30 PM  Result Value Ref Range Status   Enterococcus species NOT DETECTED NOT DETECTED Final   Listeria monocytogenes NOT DETECTED NOT DETECTED Final   Staphylococcus species NOT DETECTED NOT DETECTED Final   Staphylococcus aureus (BCID) NOT DETECTED NOT DETECTED Final   Streptococcus species DETECTED (A) NOT DETECTED Final    Comment: Not Enterococcus species, Streptococcus agalactiae,  Streptococcus pyogenes, or Streptococcus pneumoniae. CRITICAL RESULT CALLED TO, READ BACK BY AND VERIFIED WITH: PHARMD STEVENS H. [id number] FCP    Streptococcus agalactiae NOT DETECTED NOT DETECTED Final   Streptococcus pneumoniae NOT DETECTED NOT DETECTED Final   Streptococcus pyogenes NOT DETECTED NOT DETECTED Final   Acinetobacter baumannii NOT DETECTED NOT DETECTED Final   Enterobacteriaceae species NOT DETECTED NOT DETECTED Final   Enterobacter cloacae complex NOT DETECTED NOT DETECTED Final   Escherichia coli NOT DETECTED NOT DETECTED Final   Klebsiella oxytoca NOT DETECTED NOT DETECTED Final   Klebsiella pneumoniae NOT DETECTED NOT DETECTED Final   Proteus species NOT DETECTED NOT DETECTED Final   Serratia marcescens NOT DETECTED NOT DETECTED Final   Haemophilus influenzae NOT DETECTED NOT DETECTED Final   Neisseria meningitidis NOT DETECTED NOT DETECTED Final   Pseudomonas aeruginosa NOT DETECTED NOT DETECTED Final   Candida albicans NOT DETECTED NOT DETECTED Final   Candida glabrata NOT DETECTED NOT DETECTED Final   Candida krusei NOT DETECTED NOT DETECTED Final   Candida parapsilosis NOT DETECTED NOT DETECTED Final   Candida tropicalis NOT DETECTED NOT DETECTED Final    Comment: Performed at [facility], [place]. [address], [place], [place] [address]  Blood Culture (routine x 2)     Status: Abnormal   Collection Time: 02/09/19  1:33 PM   Specimen: BLOOD RIGHT HAND  Result Value Ref Range Status   Specimen Description   Final    BLOOD RIGHT HAND Performed at [facility], [address], [place], [place] [address]    Special Requests   Final    BOTTLES DRAWN AEROBIC AND ANAEROBIC Blood Culture adequate volume Performed at [facility], [address], [place], [place] [address]    Culture  Setup Time   Final    GRAM POSITIVE COCCI Gram Stain Report Called to,Read Back By and Verified With: BONDURANT,R @ 0210 ON 02/10/19 BY JUW ANAEROBIC BOTTLE ONLY GS DONE @ [facility]      Culture (A)  Final    STREPTOCOCCUS INFANTARIUS SUSCEPTIBILITIES PERFORMED ON PREVIOUS CULTURE WITHIN THE LAST 5 DAYS. Performed at [facility], [place] [address], [place], [place] [address]    Report Status 02/12/2019 FINAL  Final  SARS Coronavirus 2 [facility] order, Performed in [facility] hospital lab) Nasopharyngeal Nasopharyngeal Swab     Status: None   Collection Time: 02/09/19  2:00 PM   Specimen: Nasopharyngeal Swab  Result Value Ref Range Status   SARS Coronavirus 2 NEGATIVE NEGATIVE Final    Comment: (NOTE) If result is NEGATIVE SARS-CoV-2 target nucleic acids are NOT DETECTED. The SARS-CoV-2 RNA is generally detectable in upper and lower  respiratory specimens during the acute phase of infection. The  lowest  concentration of SARS-CoV-2 viral copies this assay can detect is 250  copies / mL. A negative result does not preclude SARS-CoV-2 infection  and should not be used as the sole basis for treatment or other  patient management decisions.  A negative result may occur with  improper specimen collection / handling, submission of specimen other  than nasopharyngeal swab, presence of viral mutation(s) within the  areas targeted by this assay, and inadequate number of viral copies  (<250 copies / mL). A negative result must be combined with clinical  observations, patient history, and epidemiological information. If result is POSITIVE SARS-CoV-2 target nucleic acids are DETECTED. The SARS-CoV-2 RNA is generally detectable in upper and lower  respiratory specimens dur ing the acute phase of infection.  Positive  results are indicative of active infection with SARS-CoV-2.  Clinical  correlation with patient history and other diagnostic information is  necessary to determine patient infection status.  Positive results do  not rule out bacterial infection or co-infection with other viruses. If result is PRESUMPTIVE POSTIVE SARS-CoV-2 nucleic acids MAY BE PRESENT.   A  presumptive positive result was obtained on the submitted specimen  and confirmed on repeat testing.  While 2019 novel coronavirus  (SARS-CoV-2) nucleic acids may be present in the submitted sample  additional confirmatory testing may be necessary for epidemiological  and / or clinical management purposes  to differentiate between  SARS-CoV-2 and other Sarbecovirus currently known to infect humans.  If clinically indicated additional testing with an alternate test  methodology [phone]) is advised. The SARS-CoV-2 RNA is generally  detectable in upper and lower respiratory sp ecimens during the acute  phase of infection. The expected result is Negative. Fact Sheet for Patients:  StrictlyIdeas.no Fact Sheet for Healthcare Providers: BankingDealers.co.za This test is not yet approved or cleared by the [place] FDA and has been authorized for detection and/or diagnosis of SARS-CoV-2 by FDA under an Emergency Use Authorization (EUA).  This EUA will remain in effect (meaning this test can be used) for the duration of the COVID-19 declaration under Section 564(b)(1) of the Act, 21 U.S.C. section 360bbb-3(b)(1), unless the authorization is terminated or revoked sooner. Performed at [facility], [address], [place], [place] [address]   Urine culture     Status: Abnormal   Collection Time: 02/09/19  3:45 PM   Specimen: In/Out Cath Urine  Result Value Ref Range Status   Specimen Description   Final    IN/OUT CATH URINE Performed at [facility], [address] [place], [place] [address]    Special Requests   Final    Normal Performed at [facility], [address] [place], [place] [address]    Culture 10,000 COLONIES/mL STAPHYLOCOCCUS LUGDUNENSIS (A)  Final   Report Status 02/11/2019 FINAL  Final   Organism ID, Bacteria STAPHYLOCOCCUS LUGDUNENSIS (A)  Final      Susceptibility   Staphylococcus lugdunensis - MIC*    CIPROFLOXACIN  <=0.5 SENSITIVE Sensitive     GENTAMICIN <=0.5 SENSITIVE Sensitive     NITROFURANTOIN <=16 SENSITIVE Sensitive     OXACILLIN >=4 RESISTANT Resistant     TETRACYCLINE 2 SENSITIVE Sensitive     VANCOMYCIN <=0.5 SENSITIVE Sensitive     TRIMETH/SULFA <=10 SENSITIVE Sensitive     CLINDAMYCIN >=8 RESISTANT Resistant     RIFAMPIN <=0.5 SENSITIVE Sensitive     Inducible Clindamycin NEGATIVE Sensitive     * 10,000 COLONIES/mL STAPHYLOCOCCUS LUGDUNENSIS  Culture, blood (single)  Status: None (Preliminary result)   Collection Time: 02/10/19  5:27 AM   Specimen: Porta Cath; Blood  Result Value Ref Range Status   Specimen Description PORTA CATH BOTTLES DRAWN AEROBIC AND ANAEROBIC  Final   Special Requests Blood Culture adequate volume  Final   Culture   Final    NO GROWTH 3 DAYS Performed at [facility], [address] [place], [place] [address]    Report Status PENDING  Incomplete  Culture, blood (Routine X 2) w Reflex to ID Panel     Status: None (Preliminary result)   Collection Time: 02/11/19  2:13 PM   Specimen: BLOOD RIGHT WRIST  Result Value Ref Range Status   Specimen Description BLOOD RIGHT WRIST  Final   Special Requests   Final    BOTTLES DRAWN AEROBIC AND ANAEROBIC Blood Culture results may not be optimal due to an excessive volume of blood received in culture bottles   Culture   Final    NO GROWTH 2 DAYS Performed at [facility], [address], [place], [place] [address]    Report Status PENDING  Incomplete  Culture, blood (Routine X 2) w Reflex to ID Panel     Status: None (Preliminary result)   Collection Time: 02/11/19  2:18 PM   Specimen: BLOOD LEFT ARM  Result Value Ref Range Status   Specimen Description BLOOD LEFT ARM  Final   Special Requests   Final    BOTTLES DRAWN AEROBIC AND ANAEROBIC Blood Culture results may not be optimal due to an excessive volume of blood received in culture bottles   Culture   Final    NO GROWTH 2 DAYS Performed at [facility], [address], [place], [place] [address]    Report Status PENDING  Incomplete  MRSA PCR Screening     Status: None   Collection Time: 02/12/19  7:38 PM   Specimen: Nasopharyngeal  Result Value Ref Range Status   MRSA by PCR NEGATIVE NEGATIVE Final    Comment:        The GeneXpert MRSA Assay (FDA approved for NASAL specimens only), is one component of a comprehensive MRSA colonization surveillance program. It is not intended to diagnose MRSA infection nor to guide or monitor treatment for MRSA infections. Performed at [facility], [address], [place], [place] [address]      Scheduled Meds:  sodium chloride   Intravenous Once   sodium chloride   Intravenous Once   allopurinol  300 mg Oral QPM   Chlorhexidine Gluconate Cloth  6 each Topical Once   And   Chlorhexidine Gluconate Cloth  6 each Topical Once   docusate sodium  100 mg Oral QHS   loratadine  10 mg Oral Daily   magic mouthwash w/lidocaine  10 mL Oral QID   magnesium oxide  400 mg Oral QHS   Tbo-Filgrastim  480 mcg Subcutaneous Daily   Continuous Infusions:  sodium chloride     cefTRIAXone (ROCEPHIN)  IV 2 g (02/13/19 1039)    Procedures/Studies: Ct Chest W Contrast  Result Date: 02/12/2019 CLINICAL DATA:  Fever of unknown origin. Cough with loose stools. AML. EXAM: CT CHEST, ABDOMEN, AND PELVIS WITH CONTRAST TECHNIQUE: Multidetector CT imaging of the chest, abdomen and pelvis was performed following the standard protocol during bolus administration of intravenous contrast. CONTRAST:  146mL OMNIPAQUE IOHEXOL 300 MG/ML SOLN, 38mL OMNIPAQUE IOHEXOL 300 MG/ML SOLN COMPARISON:  CT urogram 09/29/2014. FINDINGS: CT CHEST FINDINGS Cardiovascular: The heart size is normal.  No substantial pericardial effusion. No thoracic aortic aneurysm. Right Port-A-Cath tip is positioned in the mid SVC. Mediastinum/Nodes: No mediastinal lymphadenopathy. There is no hilar lymphadenopathy. The esophagus has normal imaging  features. There is no axillary lymphadenopathy. Lungs/Pleura: Numerous irregular bilateral pulmonary nodules are identified. One of the more dominant nodules is seen in the left upper lobe on 35/3, measuring 1.4 cm diameter. Index nodule right upper lobe on 75/3 measures 8 mm. Left lower lobe index nodule on 94/3 is 10 mm. No focal airspace consolidation. No pleural effusion. Musculoskeletal: No worrisome lytic or sclerotic osseous abnormality. CT ABDOMEN PELVIS FINDINGS Hepatobiliary: The liver shows diffusely decreased attenuation suggesting fat deposition. No suspicious focal abnormality within the liver parenchyma. Gallbladder is nondistended. No intrahepatic or extrahepatic biliary dilation. Pancreas: No focal mass lesion. No dilatation of the main duct. No intraparenchymal cyst. No peripancreatic edema. Spleen: No splenomegaly. No focal mass lesion. Adrenals/Urinary Tract: No adrenal nodule or mass. Left kidney is markedly atrophic with 3.6 cm interpolar cystic lesion similar to prior study. 5.7 cm cystic lesion anterior interpolar right kidney was 5.4 cm previously. 14 mm low-density lesion upper interpolar right kidney (69/2) was present previously and is not substantially changed. 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 8 mm nonobstructing stone identified lower pole right kidney. No evidence for hydroureter. Mild but asymmetric left bladder wall thickening evident (image 112/2. This was probably present on the previous study but less conspicuous. Stomach/Bowel: Stomach is unremarkable. No gastric wall thickening. No evidence of outlet obstruction. Duodenum is normally positioned as is the ligament of Treitz. Large duodenal diverticulum evident. No small bowel wall thickening. No small bowel dilatation. The terminal ileum is normal. The appendix is normal. No gross colonic mass. No colonic wall thickening. Diverticuli are seen scattered along the entire length of the colon without CT  findings of diverticulitis. Vascular/Lymphatic: There is abdominal aortic atherosclerosis without aneurysm. There is no gastrohepatic or hepatoduodenal ligament lymphadenopathy. No intraperitoneal or retroperitoneal lymphadenopathy. No pelvic sidewall lymphadenopathy. Reproductive: Prostate gland is enlarged. Other: No intraperitoneal free fluid. Musculoskeletal: No worrisome lytic or sclerotic osseous abnormality. Status post lumbar fusion. IMPRESSION: 1. Numerous bilateral irregular/spiculated pulmonary nodules measuring up to 14 mm. Imaging features are nonspecific and could be related to an infectious etiology or neoplasm. 2. Mild asymmetric left bladder wall thickening, likely present on the study from over 4 years ago although less conspicuous on that exam due to lack of intravenous contrast administration. 3. Hepatic steatosis. 4. Bilateral renal cysts with 8 mm nonobstructing right renal stone. 5.  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   On: 02/12/2019 20:57   Ct Abdomen Pelvis W Contrast  Result Date: 02/12/2019 CLINICAL DATA:  Fever of unknown origin. Cough with loose stools. AML. EXAM: CT CHEST, ABDOMEN, AND PELVIS WITH CONTRAST TECHNIQUE: Multidetector CT imaging of the chest, abdomen and pelvis was performed following the standard protocol during bolus administration of intravenous contrast. CONTRAST:  140mL OMNIPAQUE IOHEXOL 300 MG/ML SOLN, 91mL OMNIPAQUE IOHEXOL 300 MG/ML SOLN COMPARISON:  CT urogram 09/29/2014. FINDINGS: CT CHEST FINDINGS Cardiovascular: The heart size is normal. No substantial pericardial effusion. No thoracic aortic aneurysm. Right Port-A-Cath tip is positioned in the mid SVC. Mediastinum/Nodes: No mediastinal lymphadenopathy. There is no hilar lymphadenopathy. The esophagus has normal imaging features. There is no axillary lymphadenopathy. Lungs/Pleura: Numerous irregular bilateral pulmonary nodules are identified. One of the more  dominant nodules is seen in the left upper lobe on 35/3, measuring  1.4 cm diameter. Index nodule right upper lobe on 75/3 measures 8 mm. Left lower lobe index nodule on 94/3 is 10 mm. No focal airspace consolidation. No pleural effusion. Musculoskeletal: No worrisome lytic or sclerotic osseous abnormality. CT ABDOMEN PELVIS FINDINGS Hepatobiliary: The liver shows diffusely decreased attenuation suggesting fat deposition. No suspicious focal abnormality within the liver parenchyma. Gallbladder is nondistended. No intrahepatic or extrahepatic biliary dilation. Pancreas: No focal mass lesion. No dilatation of the main duct. No intraparenchymal cyst. No peripancreatic edema. Spleen: No splenomegaly. No focal mass lesion. Adrenals/Urinary Tract: No adrenal nodule or mass. Left kidney is markedly atrophic with 3.6 cm interpolar cystic lesion similar to prior study. 5.7 cm cystic lesion anterior interpolar right kidney was 5.4 cm previously. 14 mm low-density lesion upper interpolar right kidney (69/2) was present previously and is not substantially changed. 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 8 mm nonobstructing stone identified lower pole right kidney. No evidence for hydroureter. Mild but asymmetric left bladder wall thickening evident (image 112/2. This was probably present on the previous study but less conspicuous. Stomach/Bowel: Stomach is unremarkable. No gastric wall thickening. No evidence of outlet obstruction. Duodenum is normally positioned as is the ligament of Treitz. Large duodenal diverticulum evident. No small bowel wall thickening. No small bowel dilatation. The terminal ileum is normal. The appendix is normal. No gross colonic mass. No colonic wall thickening. Diverticuli are seen scattered along the entire length of the colon without CT findings of diverticulitis. Vascular/Lymphatic: There is abdominal aortic atherosclerosis without aneurysm. There is no gastrohepatic or  hepatoduodenal ligament lymphadenopathy. No intraperitoneal or retroperitoneal lymphadenopathy. No pelvic sidewall lymphadenopathy. Reproductive: Prostate gland is enlarged. Other: No intraperitoneal free fluid. Musculoskeletal: No worrisome lytic or sclerotic osseous abnormality. Status post lumbar fusion. IMPRESSION: 1. Numerous bilateral irregular/spiculated pulmonary nodules measuring up to 14 mm. Imaging features are nonspecific and could be related to an infectious etiology or neoplasm. 2. Mild asymmetric left bladder wall thickening, likely present on the study from over 4 years ago although less conspicuous on that exam due to lack of intravenous contrast administration. 3. Hepatic steatosis. 4. Bilateral renal cysts with 8 mm nonobstructing right renal stone. 5.  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   On: 02/12/2019 20:57   Dg Chest Port 1 View  Result Date: 02/09/2019 CLINICAL DATA:  Fever and cough. Patient has leukemia receiving chemotherapy. EXAM: PORTABLE CHEST 1 VIEW COMPARISON:  08/28/2018 FINDINGS: Right subclavian Port-A-Cath unchanged. Lungs are adequately inflated without focal airspace consolidation or effusion. Cardiomediastinal silhouette and remainder the exam is unchanged. IMPRESSION: No active disease. Electronically Signed   By: Marin Olp M.D.   On: 02/09/2019 14:26    Orson Eva, DO  [facility] Pager [phone]  If 7PM-7AM, please contact night-coverage [email or web address] Password [email or web address] 02/13/2019, 5:10 PM   LOS: 4 days

## 2019-02-13 NOTE — Progress Notes (Signed)
Notified Dr. Olevia Bowens that patient had a temperature of 103.1 orally. He order 1,000 mg of acetaminophen

## 2019-02-14 ENCOUNTER — Other Ambulatory Visit (HOSPITAL_COMMUNITY): Payer: Self-pay | Admitting: Nurse Practitioner

## 2019-02-14 ENCOUNTER — Other Ambulatory Visit (HOSPITAL_COMMUNITY): Payer: Self-pay

## 2019-02-14 ENCOUNTER — Encounter (HOSPITAL_COMMUNITY)
Admission: EM | Disposition: A | Discharge status: Home Health | Payer: Self-pay | Source: Home / Self Care | Attending: Internal Medicine

## 2019-02-14 DIAGNOSIS — D649 Anemia, unspecified: Secondary | ICD-10-CM

## 2019-02-14 DIAGNOSIS — C9200 Acute myeloblastic leukemia, not having achieved remission: Secondary | ICD-10-CM

## 2019-02-14 DIAGNOSIS — D61818 Other pancytopenia: Secondary | ICD-10-CM

## 2019-02-14 HISTORY — PX: PORT-A-CATH REMOVAL: SHX5289

## 2019-02-14 LAB — CBC
HCT: 24.2 % — ABNORMAL LOW (ref 39.0–52.0)
Hemoglobin: 7.1 g/dL — ABNORMAL LOW (ref 13.0–17.0)
MCH: 30.6 pg (ref 26.0–34.0)
MCHC: 29.3 g/dL — ABNORMAL LOW (ref 30.0–36.0)
MCV: 104.3 fL — ABNORMAL HIGH (ref 80.0–100.0)
Platelets: 25 10*3/uL — CL (ref 150–400)
RBC: 2.32 MIL/uL — ABNORMAL LOW (ref 4.22–5.81)
RDW: 17.7 % — ABNORMAL HIGH (ref 11.5–15.5)
WBC: 0.6 10*3/uL — CL (ref 4.0–10.5)
nRBC: 0 % (ref 0.0–0.2)

## 2019-02-14 LAB — BPAM PLATELET PHERESIS
Blood Product Expiration Date: 202010092359
Blood Product Expiration Date: 202010102359
ISSUE DATE / TIME: 202010081215
ISSUE DATE / TIME: 202010081537
Unit Type and Rh: 5100
Unit Type and Rh: 8400

## 2019-02-14 LAB — PREPARE PLATELET PHERESIS
Unit division: 0
Unit division: 0

## 2019-02-14 LAB — ASPERGILLUS ANTIGEN, BAL/SERUM: Aspergillus Ag, BAL/Serum: 0.04 Index (ref 0.00–0.49)

## 2019-02-14 LAB — CREATININE, SERUM
Creatinine, Ser: 1.27 mg/dL — ABNORMAL HIGH (ref 0.61–1.24)
GFR calc Af Amer: >60 mL/min (ref >60)
GFR calc non Af Amer: 55 mL/min — ABNORMAL LOW (ref >60)

## 2019-02-14 SURGERY — MINOR REMOVAL PORT-A-CATH
Anesthesia: LOCAL | Site: Chest | Laterality: Right

## 2019-02-14 MED ORDER — HEMOSTATIC AGENTS (NO CHARGE) OPTIME
TOPICAL | Status: DC | PRN
  Administered 2019-02-14: 1 via TOPICAL

## 2019-02-14 MED ORDER — POVIDONE-IODINE 10 % EX OINT
TOPICAL_OINTMENT | CUTANEOUS | Status: AC
  Filled 2019-02-14: qty 1

## 2019-02-14 MED ORDER — SODIUM CHLORIDE 0.9% IV SOLUTION
Freq: Once | INTRAVENOUS | Status: AC
  Administered 2019-02-14: 16:00:00 via INTRAVENOUS

## 2019-02-14 MED ORDER — SODIUM CHLORIDE 0.9 % IV SOLN: INTRAVENOUS | Status: DC | PRN

## 2019-02-14 MED ORDER — 0.9 % SODIUM CHLORIDE (POUR BTL) OPTIME
TOPICAL | Status: DC | PRN
  Administered 2019-02-14: 1000 mL

## 2019-02-14 MED ORDER — LIDOCAINE HCL (PF) 1 % IJ SOLN
INTRAMUSCULAR | Status: DC | PRN
  Administered 2019-02-14: 10 mL

## 2019-02-14 MED ORDER — POVIDONE-IODINE 10 % EX OINT
TOPICAL_OINTMENT | CUTANEOUS | Status: DC | PRN
  Administered 2019-02-14: 1 via TOPICAL

## 2019-02-14 MED ORDER — LIDOCAINE HCL (PF) 1 % IJ SOLN
INTRAMUSCULAR | Status: AC
  Filled 2019-02-14: qty 30

## 2019-02-14 SURGICAL SUPPLY — 25 items
APPLICATOR CHLORAPREP 10.5 ORG (MISCELLANEOUS) ×2 IMPLANT
CLOTH BEACON ORANGE TIMEOUT ST (SAFETY) ×2 IMPLANT
DECANTER SPIKE VIAL GLASS SM (MISCELLANEOUS) ×2 IMPLANT
DRAPE HALF SHEET 40X57 (DRAPES) ×2 IMPLANT
ELECT REM PT RETURN 9FT ADLT (ELECTROSURGICAL) ×2
ELECTRODE REM PT RTRN 9FT ADLT (ELECTROSURGICAL) ×1 IMPLANT
GLOVE BIO SURGEON STRL SZ7 (GLOVE) ×2 IMPLANT
GLOVE BIOGEL PI IND STRL 7.0 (GLOVE) ×3 IMPLANT
GLOVE BIOGEL PI INDICATOR 7.0 (GLOVE) ×3
GLOVE SURG SS PI 7.5 STRL IVOR (GLOVE) ×2 IMPLANT
GOWN STRL REUS W/TWL LRG LVL3 (GOWN DISPOSABLE) ×4 IMPLANT
HEMOSTAT ARISTA ABSORB 1G (HEMOSTASIS) ×2 IMPLANT
KIT BLADEGUARD II DBL (SET/KITS/TRAYS/PACK) ×2 IMPLANT
NEEDLE HYPO 25X1 1.5 SAFETY (NEEDLE) ×2 IMPLANT
NS IRRIG 1000ML POUR BTL (IV SOLUTION) ×2 IMPLANT
PENCIL HANDSWITCHING (ELECTRODE) ×2 IMPLANT
SPONGE GAUZE 2X2 8PLY STRL LF (GAUZE/BANDAGES/DRESSINGS) ×4 IMPLANT
STAPLER VISISTAT (STAPLE) ×2 IMPLANT
SUT MNCRL AB 4-0 PS2 18 (SUTURE) ×2 IMPLANT
SUT VIC AB 3-0 SH 27 (SUTURE) ×1
SUT VIC AB 3-0 SH 27X BRD (SUTURE) ×1 IMPLANT
SWAB COLLECTION DEVICE MRSA (MISCELLANEOUS) ×2 IMPLANT
SYR CONTROL 10ML LL (SYRINGE) ×2 IMPLANT
TAPE CLOTH SURG 4X10 WHT LF (GAUZE/BANDAGES/DRESSINGS) ×2 IMPLANT
TOWEL OR 17X26 4PK STRL BLUE (TOWEL DISPOSABLE) ×2 IMPLANT

## 2019-02-14 NOTE — Progress Notes (Signed)
PROGRESS NOTE  Richard Wallace [id number] DOB: 09-May-1945 DOA: 02/09/2019 PCP: [name], MD  Brief History: 73 y.o.malewith medical history significant ofactive chemotherapy for AML, last tx 2 weeks ago, CKD, GERD, HTN, atrophy of kidney and dc from here 5 days ago where he was admitted for rectal bleeding and severe thrombocytopenia (9000) nowpresenting with fever to 104 since last nightw/o sweats but with rigors, mild nonproductive cough (denies sob) and sore throat. He endorses nausea but this is a chronic finding, not new.He denies diarrhea but has had some loose stools. Denies mucus or blood in the stool currently..Denies chest pain, headache, abdominal pain, dysuria. He denies unusual bruising or petechiae. No further rectal bleeding since the day of dc from here 9/29. He reports feeling profoundly weak without dizziness, no focal weakness. The patient was admitted for treatment of neutropenic fever.  Blood culture showed Streptococcus infantarius.  Because of persistent fevers, general surgery was consulted for Port-A-Cath removal.  Antibiotics were narrowed to ceftriaxone.  Assessment/Plan: neutropenic fever -Blood cultures positive for Streptococcus species bacteremia -pt continues to remain neutropenic and febrile although fever curve is decreasing -Continue supportive care -Patient continues spiking fevers -continue granix -consult surgery to remove port-a-cath -CT chest/abd/pelvis--no lymphadenopathy.  Numerous bilateral pulmonary nodules; no consolidation, no pleural effusion; no hydronephrosis; no hydroureter; diverticulosis; no retroperitoneal lymphadenopathy -case discussed with ID, Dr. Comer--->proceed in stepwise fashion-->remove port-a-cath out first and if fever persists, then pursue treatment/diagnosis of pulm nodules -check fungal antibodies, serum beta-glucan  Bacteremia -strep infantarius -with persistent fever, consulted surgery to  remove port-a-cath -plan port removal 10/9 with platelet transfusion afterward -case discussed with Dr. Duanne Limerick  leukemia/MDS -appreciate med/onc follow up -case discussed with Dr. Delton Coombes -Patient actively receiving chemotherapyas an outpatient. -Cycle 1 of decitabine and venetoclax on 01/20/2019. -Venetoclax held on 02/03/2019 as his platelet count was low at 9000. -Holding chemotherapeutic agents at this time.  pancytopenia -Patient received 3 units of plateletson admission,as his platelet levelwas7000. 10/8--transfuse 2 more units platelets (7 total for admission) -02/14/19--transfuse 2 more units platelets after surgery (9 units total) -having intermittent rectal bleeding. -Hemoglobin of6.7; transfused1 unit of PRBCs 02/11/19  essential hypertension -Currently not taking any medication as an outpatient -Continue to monitor vital signs and initiate antihypertensive management if needed.  Staphylococcus lugdunensisbacteruria -Patient denies dysuria -No treatment will be provided for this isolated microorganism currently as per ID recommendations.  Mucositis  -start magic mouthwash with lidocaine  Lung Nodules -incidental findings on CT chest but may represent opportunistic infection if fever persists     Disposition Plan:Not stable for d/c Family Communication:Wife updatedat bedside 10/8  Consultants:Med/onc; general surgery  Code Status: FULL  DVT Prophylaxis: SCDs   Procedures: As Listed in Progress Note Above  Antibiotics: Ceftriaxone 10/5>>>      Subjective: Pt complains of sore throat.  Denies f/c, cp, sob, n/v/d.  Complains of nonproductive cough without hemoptysis.  Denies abd pain, headache, dysuria  Objective: Vitals:   02/13/19 1835 02/13/19 2128 02/14/19 0056 02/14/19 0511  BP: (!) 160/96 121/69  120/73  Pulse: (!) 105 93  86  Resp: 18 16  16   Temp: (!) 102.5 F (39.2 C) 99 F (37.2 C) (!) 100.8  F (38.2 C) 99.1 F (37.3 C)  TempSrc: Oral Oral Oral Oral  SpO2: 94% 96%  99%  Weight:      Height:        Intake/Output Summary (Last 24 hours) at 02/14/2019 [id number] Last  data filed at 02/14/2019 0622 Gross per 24 hour  Intake 2400.61 ml  Output 850 ml  Net 1550.61 ml   Weight change:  Exam:   General:  Pt is alert, follows commands appropriately, not in acute distress  HEENT: No icterus, No thrush, No neck mass, [place]/AT  Cardiovascular: RRR, S1/S2, no rubs, no gallops  Respiratory: CTA bilaterally, no wheezing, no crackles, no rhonchi  Abdomen: Soft/+BS, non tender, non distended, no guarding  Extremities: No edema, No lymphangitis, No petechiae, No rashes, no synovitis   Data Reviewed: I have personally reviewed following labs and imaging studies Basic Metabolic Panel: Recent Labs  Lab 02/09/19 1330 02/10/19 0527 02/11/19 0542 02/12/19 0501 02/13/19 0641 02/14/19 0450  NA 136 138 136  --  137  --   K 4.1 3.8 4.0  --  4.9  --   CL 102 106 107  --  102  --   CO2 23 22 24   --  27  --   GLUCOSE 116* 101* 103*  --  103*  --   BUN 28* 23 24*  --  26*  --   CREATININE 1.32* 1.04 1.02 0.94 1.10 1.27*  CALCIUM 9.1 8.2* 8.5*  --  9.0  --    Liver Function Tests: Recent Labs  Lab 02/09/19 1330 02/13/19 0641  AST 21 29  ALT 24 44  ALKPHOS 55 70  BILITOT 3.1* 1.3*  PROT 7.0 6.1*  ALBUMIN 3.9 2.9*   No results for input(s): LIPASE, AMYLASE in the last 168 hours. No results for input(s): AMMONIA in the last 168 hours. Coagulation Profile: Recent Labs  Lab 02/09/19 1330  INR 1.1   CBC: Recent Labs  Lab 02/09/19 1330  02/10/19 0845 02/11/19 0542 02/12/19 0501 02/13/19 0641 02/14/19 0458  WBC 0.4*   < > 0.6* 0.4* 0.4* 0.6* 0.6*  NEUTROABS 0.0*  --  0.0* 0.0* 0.0* 0.0*  --   HGB 8.8*   < > 7.2* 6.7* 7.5* 7.4* 7.1*  HCT 31.9*   < > 25.8* 23.5* 25.5* 25.4* 24.2*  MCV 108.9*   < > 110.3* 106.3* 103.7* 104.5* 104.3*  PLT 7*   < > 40* 27* 17* 20* 25*   < >  = values in this interval not displayed.   Cardiac Enzymes: No results for input(s): CKTOTAL, CKMB, CKMBINDEX, TROPONINI in the last 168 hours. BNP: Invalid input(s): POCBNP CBG: No results for input(s): GLUCAP in the last 168 hours. HbA1C: No results for input(s): HGBA1C in the last 72 hours. Urine analysis:    Component Value Date/Time   COLORURINE AMBER (A) 02/09/2019 1545   APPEARANCEUR CLEAR 02/09/2019 1545   LABSPEC 1.024 02/09/2019 1545   PHURINE 5.0 02/09/2019 1545   GLUCOSEU NEGATIVE 02/09/2019 1545   HGBUR SMALL (A) 02/09/2019 1545   BILIRUBINUR NEGATIVE 02/09/2019 1545   KETONESUR NEGATIVE 02/09/2019 1545   PROTEINUR 100 (A) 02/09/2019 1545   UROBILINOGEN 1.0 08/24/2014 1630   NITRITE NEGATIVE 02/09/2019 1545   LEUKOCYTESUR NEGATIVE 02/09/2019 1545   Sepsis Labs: @LABRCNTIP (procalcitonin:4,lacticidven:4) ) Recent Results (from the past 240 hour(s))  Blood Culture (routine x 2)     Status: Abnormal   Collection Time: 02/09/19  1:30 PM   Specimen: Right Antecubital; Blood  Result Value Ref Range Status   Specimen Description   Final    RIGHT ANTECUBITAL Performed at [facility], [address] [place],  [address]    Special Requests   Final    BOTTLES DRAWN AEROBIC AND ANAEROBIC  Blood Culture results may not be optimal due to an excessive volume of blood received in culture bottles Performed at [facility], [address] [place], [place] [address]    Culture  Setup Time   Final    GRAM POSITIVE COCCI BONDURANT,R @ 0210 ON 02/10/19 BY JUW ANAEROBIC BOTTLE ONLY GS DONE @ [facility] CRITICAL RESULT CALLED TO, READ BACK BY AND VERIFIED WITH: PHARMD STEVENS H. [id number] FCP Performed at [facility], [place] [address], [place], [place] [address]    Culture STREPTOCOCCUS INFANTARIUS (A)  Final   Report Status 02/12/2019 FINAL  Final   Organism ID, Bacteria STREPTOCOCCUS INFANTARIUS  Final      Susceptibility   Streptococcus infantarius - MIC*     PENICILLIN <=0.06 SENSITIVE Sensitive     CEFTRIAXONE <=0.12 SENSITIVE Sensitive     ERYTHROMYCIN <=0.12 SENSITIVE Sensitive     LEVOFLOXACIN 4 INTERMEDIATE Intermediate     VANCOMYCIN 0.5 SENSITIVE Sensitive     * STREPTOCOCCUS INFANTARIUS  Blood Culture ID Panel (Reflexed)     Status: Abnormal   Collection Time: 02/09/19  1:30 PM  Result Value Ref Range Status   Enterococcus species NOT DETECTED NOT DETECTED Final   Listeria monocytogenes NOT DETECTED NOT DETECTED Final   Staphylococcus species NOT DETECTED NOT DETECTED Final   Staphylococcus aureus (BCID) NOT DETECTED NOT DETECTED Final   Streptococcus species DETECTED (A) NOT DETECTED Final    Comment: Not Enterococcus species, Streptococcus agalactiae, Streptococcus pyogenes, or Streptococcus pneumoniae. CRITICAL RESULT CALLED TO, READ BACK BY AND VERIFIED WITH: PHARMD STEVENS H. [phone] [id number] FCP    Streptococcus agalactiae NOT DETECTED NOT DETECTED Final   Streptococcus pneumoniae NOT DETECTED NOT DETECTED Final   Streptococcus pyogenes NOT DETECTED NOT DETECTED Final   Acinetobacter baumannii NOT DETECTED NOT DETECTED Final   Enterobacteriaceae species NOT DETECTED NOT DETECTED Final   Enterobacter cloacae complex NOT DETECTED NOT DETECTED Final   Escherichia coli NOT DETECTED NOT DETECTED Final   Klebsiella oxytoca NOT DETECTED NOT DETECTED Final   Klebsiella pneumoniae NOT DETECTED NOT DETECTED Final   Proteus species NOT DETECTED NOT DETECTED Final   Serratia marcescens NOT DETECTED NOT DETECTED Final   Haemophilus influenzae NOT DETECTED NOT DETECTED Final   Neisseria meningitidis NOT DETECTED NOT DETECTED Final   Pseudomonas aeruginosa NOT DETECTED NOT DETECTED Final   Candida albicans NOT DETECTED NOT DETECTED Final   Candida glabrata NOT DETECTED NOT DETECTED Final   Candida krusei NOT DETECTED NOT DETECTED Final   Candida parapsilosis NOT DETECTED NOT DETECTED Final   Candida tropicalis NOT DETECTED NOT DETECTED  Final    Comment: Performed at [facility], [place]. [address], [place], [place] [address]  Blood Culture (routine x 2)     Status: Abnormal   Collection Time: 02/09/19  1:33 PM   Specimen: BLOOD RIGHT HAND  Result Value Ref Range Status   Specimen Description   Final    BLOOD RIGHT HAND Performed at [facility], [address], [place], [place] [address]    Special Requests   Final    BOTTLES DRAWN AEROBIC AND ANAEROBIC Blood Culture adequate volume Performed at [facility], [address], [place], [place] [address]    Culture  Setup Time   Final    GRAM POSITIVE COCCI Gram Stain Report Called to,Read Back By and Verified With: BONDURANT,R @ 0210 ON 02/10/19 BY JUW ANAEROBIC BOTTLE ONLY GS DONE @ [facility]    Culture (A)  Final  STREPTOCOCCUS INFANTARIUS SUSCEPTIBILITIES PERFORMED ON PREVIOUS CULTURE WITHIN THE LAST 5 DAYS. Performed at [facility], [place] [address], [place], [place] [address]    Report Status 02/12/2019 FINAL  Final  SARS Coronavirus 2 [facility] order, Performed in [facility] hospital lab) Nasopharyngeal Nasopharyngeal Swab     Status: None   Collection Time: 02/09/19  2:00 PM   Specimen: Nasopharyngeal Swab  Result Value Ref Range Status   SARS Coronavirus 2 NEGATIVE NEGATIVE Final    Comment: (NOTE) If result is NEGATIVE SARS-CoV-2 target nucleic acids are NOT DETECTED. The SARS-CoV-2 RNA is generally detectable in upper and lower  respiratory specimens during the acute phase of infection. The lowest  concentration of SARS-CoV-2 viral copies this assay can detect is 250  copies / mL. A negative result does not preclude SARS-CoV-2 infection  and should not be used as the sole basis for treatment or other  patient management decisions.  A negative result may occur with  improper specimen collection / handling, submission of specimen other  than nasopharyngeal swab, presence of viral mutation(s) within the  areas targeted by this assay, and inadequate  number of viral copies  (<250 copies / mL). A negative result must be combined with clinical  observations, patient history, and epidemiological information. If result is POSITIVE SARS-CoV-2 target nucleic acids are DETECTED. The SARS-CoV-2 RNA is generally detectable in upper and lower  respiratory specimens dur ing the acute phase of infection.  Positive  results are indicative of active infection with SARS-CoV-2.  Clinical  correlation with patient history and other diagnostic information is  necessary to determine patient infection status.  Positive results do  not rule out bacterial infection or co-infection with other viruses. If result is PRESUMPTIVE POSTIVE SARS-CoV-2 nucleic acids MAY BE PRESENT.   A presumptive positive result was obtained on the submitted specimen  and confirmed on repeat testing.  While 2019 novel coronavirus  (SARS-CoV-2) nucleic acids may be present in the submitted sample  additional confirmatory testing may be necessary for epidemiological  and / or clinical management purposes  to differentiate between  SARS-CoV-2 and other Sarbecovirus currently known to infect humans.  If clinically indicated additional testing with an alternate test  methodology [phone]) is advised. The SARS-CoV-2 RNA is generally  detectable in upper and lower respiratory sp ecimens during the acute  phase of infection. The expected result is Negative. Fact Sheet for Patients:  StrictlyIdeas.no Fact Sheet for Healthcare Providers: BankingDealers.co.za This test is not yet approved or cleared by the [place] FDA and has been authorized for detection and/or diagnosis of SARS-CoV-2 by FDA under an Emergency Use Authorization (EUA).  This EUA will remain in effect (meaning this test can be used) for the duration of the COVID-19 declaration under Section 564(b)(1) of the Act, 21 U.S.C. section 360bbb-3(b)(1), unless the  authorization is terminated or revoked sooner. Performed at [facility], [address] [place], [place] [address]   Urine culture     Status: Abnormal   Collection Time: 02/09/19  3:45 PM   Specimen: In/Out Cath Urine  Result Value Ref Range Status   Specimen Description   Final    IN/OUT CATH URINE Performed at [facility], [address] [place], [place] [address]    Special Requests   Final    Normal Performed at [facility], [address] [place], [place] [address]    Culture 10,000 COLONIES/mL STAPHYLOCOCCUS LUGDUNENSIS (A)  Final   Report Status 02/11/2019 FINAL  Final   Organism ID, Bacteria STAPHYLOCOCCUS LUGDUNENSIS (A)  Final      Susceptibility   Staphylococcus lugdunensis - MIC*    CIPROFLOXACIN <=0.5 SENSITIVE Sensitive     GENTAMICIN <=0.5 SENSITIVE Sensitive     NITROFURANTOIN <=16 SENSITIVE Sensitive     OXACILLIN >=4 RESISTANT Resistant     TETRACYCLINE 2 SENSITIVE Sensitive     VANCOMYCIN <=0.5 SENSITIVE Sensitive     TRIMETH/SULFA <=10 SENSITIVE Sensitive     CLINDAMYCIN >=8 RESISTANT Resistant     RIFAMPIN <=0.5 SENSITIVE Sensitive     Inducible Clindamycin NEGATIVE Sensitive     * 10,000 COLONIES/mL STAPHYLOCOCCUS LUGDUNENSIS  Culture, blood (single)     Status: None (Preliminary result)   Collection Time: 02/10/19  5:27 AM   Specimen: Porta Cath; Blood  Result Value Ref Range Status   Specimen Description PORTA CATH BOTTLES DRAWN AEROBIC AND ANAEROBIC  Final   Special Requests Blood Culture adequate volume  Final   Culture   Final    NO GROWTH 4 DAYS Performed at [facility], [address] [place], [place] [address]    Report Status PENDING  Incomplete  Culture, blood (Routine X 2) w Reflex to ID Panel     Status: None (Preliminary result)   Collection Time: 02/11/19  2:13 PM   Specimen: BLOOD RIGHT WRIST  Result Value Ref Range Status   Specimen Description BLOOD RIGHT WRIST  Final   Special Requests   Final    BOTTLES DRAWN AEROBIC AND  ANAEROBIC Blood Culture results may not be optimal due to an excessive volume of blood received in culture bottles   Culture   Final    NO GROWTH 3 DAYS Performed at [facility], [address] [place], [place] [address]    Report Status PENDING  Incomplete  Culture, blood (Routine X 2) w Reflex to ID Panel     Status: None (Preliminary result)   Collection Time: 02/11/19  2:18 PM   Specimen: BLOOD LEFT ARM  Result Value Ref Range Status   Specimen Description BLOOD LEFT ARM  Final   Special Requests   Final    BOTTLES DRAWN AEROBIC AND ANAEROBIC Blood Culture results may not be optimal due to an excessive volume of blood received in culture bottles   Culture   Final    NO GROWTH 3 DAYS Performed at [facility], [address], [place], [place] [address]    Report Status PENDING  Incomplete  MRSA PCR Screening     Status: None   Collection Time: 02/12/19  7:38 PM   Specimen: Nasopharyngeal  Result Value Ref Range Status   MRSA by PCR NEGATIVE NEGATIVE Final    Comment:        The GeneXpert MRSA Assay (FDA approved for NASAL specimens only), is one component of a comprehensive MRSA colonization surveillance program. It is not intended to diagnose MRSA infection nor to guide or monitor treatment for MRSA infections. Performed at [facility], [address], [place], [place] [address]   Respiratory Panel by PCR     Status: None   Collection Time: 02/13/19  5:12 PM   Specimen: Nasopharyngeal Swab; Respiratory  Result Value Ref Range Status   Adenovirus NOT DETECTED NOT DETECTED Final   Coronavirus 229E NOT DETECTED NOT DETECTED Final    Comment: (NOTE) The Coronavirus on the Respiratory Panel, DOES NOT test for the novel  Coronavirus (2019 nCoV)    Coronavirus HKU1 NOT DETECTED NOT DETECTED Final  Coronavirus NL63 NOT DETECTED NOT DETECTED Final   Coronavirus OC43 NOT DETECTED NOT DETECTED Final   Metapneumovirus NOT DETECTED NOT DETECTED Final   Rhinovirus /  Enterovirus NOT DETECTED NOT DETECTED Final   Influenza A NOT DETECTED NOT DETECTED Final   Influenza B NOT DETECTED NOT DETECTED Final   Parainfluenza Virus 1 NOT DETECTED NOT DETECTED Final   Parainfluenza Virus 2 NOT DETECTED NOT DETECTED Final   Parainfluenza Virus 3 NOT DETECTED NOT DETECTED Final   Parainfluenza Virus 4 NOT DETECTED NOT DETECTED Final   Respiratory Syncytial Virus NOT DETECTED NOT DETECTED Final   Bordetella pertussis NOT DETECTED NOT DETECTED Final   Chlamydophila pneumoniae NOT DETECTED NOT DETECTED Final   Mycoplasma pneumoniae NOT DETECTED NOT DETECTED Final    Comment: Performed at [facility], [place] [address], [place], [place] [address]     Scheduled Meds:  sodium chloride   Intravenous Once   sodium chloride   Intravenous Once   sodium chloride   Intravenous Once   allopurinol  300 mg Oral QPM   Chlorhexidine Gluconate Cloth  6 each Topical Once   And   Chlorhexidine Gluconate Cloth  6 each Topical Once   docusate sodium  100 mg Oral QHS   loratadine  10 mg Oral Daily   magic mouthwash w/lidocaine  10 mL Oral QID   magnesium oxide  400 mg Oral QHS   Tbo-Filgrastim  480 mcg Subcutaneous Daily   Continuous Infusions:  sodium chloride     sodium chloride 50 mL/hr at 02/13/19 2140   cefTRIAXone (ROCEPHIN)  IV 2 g (02/13/19 1039)   voriconazole 600 mg (02/13/19 2230)   Followed by   voriconazole      Procedures/Studies: Ct Chest W Contrast  Result Date: 02/12/2019 CLINICAL DATA:  Fever of unknown origin. Cough with loose stools. AML. EXAM: CT CHEST, ABDOMEN, AND PELVIS WITH CONTRAST TECHNIQUE: Multidetector CT imaging of the chest, abdomen and pelvis was performed following the standard protocol during bolus administration of intravenous contrast. CONTRAST:  166mL OMNIPAQUE IOHEXOL 300 MG/ML SOLN, 32mL OMNIPAQUE IOHEXOL 300 MG/ML SOLN COMPARISON:  CT urogram 09/29/2014. FINDINGS: CT CHEST FINDINGS Cardiovascular: The heart size is  normal. No substantial pericardial effusion. No thoracic aortic aneurysm. Right Port-A-Cath tip is positioned in the mid SVC. Mediastinum/Nodes: No mediastinal lymphadenopathy. There is no hilar lymphadenopathy. The esophagus has normal imaging features. There is no axillary lymphadenopathy. Lungs/Pleura: Numerous irregular bilateral pulmonary nodules are identified. One of the more dominant nodules is seen in the left upper lobe on 35/3, measuring 1.4 cm diameter. Index nodule right upper lobe on 75/3 measures 8 mm. Left lower lobe index nodule on 94/3 is 10 mm. No focal airspace consolidation. No pleural effusion. Musculoskeletal: No worrisome lytic or sclerotic osseous abnormality. CT ABDOMEN PELVIS FINDINGS Hepatobiliary: The liver shows diffusely decreased attenuation suggesting fat deposition. No suspicious focal abnormality within the liver parenchyma. Gallbladder is nondistended. No intrahepatic or extrahepatic biliary dilation. Pancreas: No focal mass lesion. No dilatation of the main duct. No intraparenchymal cyst. No peripancreatic edema. Spleen: No splenomegaly. No focal mass lesion. Adrenals/Urinary Tract: No adrenal nodule or mass. Left kidney is markedly atrophic with 3.6 cm interpolar cystic lesion similar to prior study. 5.7 cm cystic lesion anterior interpolar right kidney was 5.4 cm previously. 14 mm low-density lesion upper interpolar right kidney (69/2) was present previously and is not substantially changed. 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 8 mm nonobstructing stone identified lower  pole right kidney. No evidence for hydroureter. Mild but asymmetric left bladder wall thickening evident (image 112/2. This was probably present on the previous study but less conspicuous. Stomach/Bowel: Stomach is unremarkable. No gastric wall thickening. No evidence of outlet obstruction. Duodenum is normally positioned as is the ligament of Treitz. Large duodenal diverticulum evident. No  small bowel wall thickening. No small bowel dilatation. The terminal ileum is normal. The appendix is normal. No gross colonic mass. No colonic wall thickening. Diverticuli are seen scattered along the entire length of the colon without CT findings of diverticulitis. Vascular/Lymphatic: There is abdominal aortic atherosclerosis without aneurysm. There is no gastrohepatic or hepatoduodenal ligament lymphadenopathy. No intraperitoneal or retroperitoneal lymphadenopathy. No pelvic sidewall lymphadenopathy. Reproductive: Prostate gland is enlarged. Other: No intraperitoneal free fluid. Musculoskeletal: No worrisome lytic or sclerotic osseous abnormality. Status post lumbar fusion. IMPRESSION: 1. Numerous bilateral irregular/spiculated pulmonary nodules measuring up to 14 mm. Imaging features are nonspecific and could be related to an infectious etiology or neoplasm. 2. Mild asymmetric left bladder wall thickening, likely present on the study from over 4 years ago although less conspicuous on that exam due to lack of intravenous contrast administration. 3. Hepatic steatosis. 4. Bilateral renal cysts with 8 mm nonobstructing right renal stone. 5.  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   On: 02/12/2019 20:57   Ct Abdomen Pelvis W Contrast  Result Date: 02/12/2019 CLINICAL DATA:  Fever of unknown origin. Cough with loose stools. AML. EXAM: CT CHEST, ABDOMEN, AND PELVIS WITH CONTRAST TECHNIQUE: Multidetector CT imaging of the chest, abdomen and pelvis was performed following the standard protocol during bolus administration of intravenous contrast. CONTRAST:  159mL OMNIPAQUE IOHEXOL 300 MG/ML SOLN, 4mL OMNIPAQUE IOHEXOL 300 MG/ML SOLN COMPARISON:  CT urogram 09/29/2014. FINDINGS: CT CHEST FINDINGS Cardiovascular: The heart size is normal. No substantial pericardial effusion. No thoracic aortic aneurysm. Right Port-A-Cath tip is positioned in the mid SVC. Mediastinum/Nodes: No  mediastinal lymphadenopathy. There is no hilar lymphadenopathy. The esophagus has normal imaging features. There is no axillary lymphadenopathy. Lungs/Pleura: Numerous irregular bilateral pulmonary nodules are identified. One of the more dominant nodules is seen in the left upper lobe on 35/3, measuring 1.4 cm diameter. Index nodule right upper lobe on 75/3 measures 8 mm. Left lower lobe index nodule on 94/3 is 10 mm. No focal airspace consolidation. No pleural effusion. Musculoskeletal: No worrisome lytic or sclerotic osseous abnormality. CT ABDOMEN PELVIS FINDINGS Hepatobiliary: The liver shows diffusely decreased attenuation suggesting fat deposition. No suspicious focal abnormality within the liver parenchyma. Gallbladder is nondistended. No intrahepatic or extrahepatic biliary dilation. Pancreas: No focal mass lesion. No dilatation of the main duct. No intraparenchymal cyst. No peripancreatic edema. Spleen: No splenomegaly. No focal mass lesion. Adrenals/Urinary Tract: No adrenal nodule or mass. Left kidney is markedly atrophic with 3.6 cm interpolar cystic lesion similar to prior study. 5.7 cm cystic lesion anterior interpolar right kidney was 5.4 cm previously. 14 mm low-density lesion upper interpolar right kidney (69/2) was present previously and is not substantially changed. 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 8 mm nonobstructing stone identified lower pole right kidney. No evidence for hydroureter. Mild but asymmetric left bladder wall thickening evident (image 112/2. This was probably present on the previous study but less conspicuous. Stomach/Bowel: Stomach is unremarkable. No gastric wall thickening. No evidence of outlet obstruction. Duodenum is normally positioned as is the ligament of Treitz. Large duodenal diverticulum evident. No small bowel wall thickening. No small  bowel dilatation. The terminal ileum is normal. The appendix is normal. No gross colonic mass. No colonic wall  thickening. Diverticuli are seen scattered along the entire length of the colon without CT findings of diverticulitis. Vascular/Lymphatic: There is abdominal aortic atherosclerosis without aneurysm. There is no gastrohepatic or hepatoduodenal ligament lymphadenopathy. No intraperitoneal or retroperitoneal lymphadenopathy. No pelvic sidewall lymphadenopathy. Reproductive: Prostate gland is enlarged. Other: No intraperitoneal free fluid. Musculoskeletal: No worrisome lytic or sclerotic osseous abnormality. Status post lumbar fusion. IMPRESSION: 1. Numerous bilateral irregular/spiculated pulmonary nodules measuring up to 14 mm. Imaging features are nonspecific and could be related to an infectious etiology or neoplasm. 2. Mild asymmetric left bladder wall thickening, likely present on the study from over 4 years ago although less conspicuous on that exam due to lack of intravenous contrast administration. 3. Hepatic steatosis. 4. Bilateral renal cysts with 8 mm nonobstructing right renal stone. 5.  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   On: 02/12/2019 20:57   Dg Chest Port 1 View  Result Date: 02/09/2019 CLINICAL DATA:  Fever and cough. Patient has leukemia receiving chemotherapy. EXAM: PORTABLE CHEST 1 VIEW COMPARISON:  08/28/2018 FINDINGS: Right subclavian Port-A-Cath unchanged. Lungs are adequately inflated without focal airspace consolidation or effusion. Cardiomediastinal silhouette and remainder the exam is unchanged. IMPRESSION: No active disease. Electronically Signed   By: Marin Olp M.D.   On: 02/09/2019 14:26    Orson Eva, DO  [facility] Pager [phone]  If 7PM-7AM, please contact night-coverage [email or web address] Password [email or web address] 02/14/2019, 9:03 AM   LOS: 5 days

## 2019-02-14 NOTE — Care Management Important Message (Signed)
Important Message  Patient Details  Name: Richard Wallace MRN: [id number] Date of Birth: 09/20/1945   Medicare Important Message Given:  Yes(given to RN to deliver to patient due to contact precautions)     Tommy Medal 02/14/2019, 12:34 PM

## 2019-02-14 NOTE — OR Nursing (Signed)
Temp 101.7 complaints of sore throat and sore mouth. Complaints of headache , rated pain 3. States headache is behind both eyes

## 2019-02-14 NOTE — Progress Notes (Signed)
ype and Dodson

## 2019-02-14 NOTE — Op Note (Signed)
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  He is bacteremia and has pancytopenia.  Despite receiving multiple units of platelets, he continues to have relative thrombocytopenia 25,000.  The patient has a port that needs to be removed.  Risks and benefits of the procedure including bleeding were fully explained to the patient, who gave informed consent.  The patient will receive platelets after the procedure.  Procedure note: The patient was placed in supine position.  The right upper chest was prepped and draped using the usual sterile technique with ChloraPrep.  Surgical site confirmation was performed.  1% Xylocaine was used for local anesthesia.  An incision was made through the previous surgical incision site.  The dissection was taken down to the port.  The port was removed in total without difficulty.  The tip was sent for culture.  There was nonspecific oozing of blood, but no frank bleeding.  Arista was placed in the bed of the former port site.  The subcutaneous layer was reapproximated using a 3-0 Vicryl interrupted suture.  The skin was closed using staples.  Betadine ointment and a dry sterile pressure dressing was applied.  All tape and needle counts were correct at the end of the procedure.  The patient was transferred back to his room in stable condition.  Complications: None  EBL: Minimal  Specimen: Catheter tip culture

## 2019-02-15 LAB — CBC WITH DIFFERENTIAL/PLATELET
Abs Immature Granulocytes: 0 10*3/uL (ref 0.00–0.07)
Basophils Absolute: 0 10*3/uL (ref 0.0–0.1)
Basophils Relative: 0 %
Eosinophils Absolute: 0 10*3/uL (ref 0.0–0.5)
Eosinophils Relative: 0 %
HCT: 23 % — ABNORMAL LOW (ref 39.0–52.0)
Hemoglobin: 6.8 g/dL — CL (ref 13.0–17.0)
Immature Granulocytes: 0 %
Lymphocytes Relative: 50 %
Lymphs Abs: 0.4 10*3/uL — ABNORMAL LOW (ref 0.7–4.0)
MCH: 30.5 pg (ref 26.0–34.0)
MCHC: 29.6 g/dL — ABNORMAL LOW (ref 30.0–36.0)
MCV: 103.1 fL — ABNORMAL HIGH (ref 80.0–100.0)
Monocytes Absolute: 0.1 10*3/uL (ref 0.1–1.0)
Monocytes Relative: 12 %
Neutro Abs: 0.3 10*3/uL — ABNORMAL LOW (ref 1.7–7.7)
Neutrophils Relative %: 38 %
Platelets: 65 10*3/uL — ABNORMAL LOW (ref 150–400)
RBC: 2.23 MIL/uL — ABNORMAL LOW (ref 4.22–5.81)
RDW: 17.2 % — ABNORMAL HIGH (ref 11.5–15.5)
WBC: 0.8 10*3/uL — CL (ref 4.0–10.5)
nRBC: 3.7 % — ABNORMAL HIGH (ref 0.0–0.2)

## 2019-02-15 LAB — BPAM PLATELET PHERESIS
Blood Product Expiration Date: 202010112359
Blood Product Expiration Date: 202010112359
ISSUE DATE / TIME: 202010091529
ISSUE DATE / TIME: 202010091816
Unit Type and Rh: 5100
Unit Type and Rh: 5100

## 2019-02-15 LAB — BASIC METABOLIC PANEL
Anion gap: 8 (ref 5–15)
BUN: 27 mg/dL — ABNORMAL HIGH (ref 8–23)
CO2: 27 mmol/L (ref 22–32)
Calcium: 9 mg/dL (ref 8.9–10.3)
Chloride: 103 mmol/L (ref 98–111)
Creatinine, Ser: 1.08 mg/dL (ref 0.61–1.24)
GFR calc Af Amer: >60 mL/min (ref >60)
GFR calc non Af Amer: >60 mL/min (ref >60)
Glucose, Bld: 105 mg/dL — ABNORMAL HIGH (ref 70–99)
Potassium: 4.3 mmol/L (ref 3.5–5.1)
Sodium: 138 mmol/L (ref 135–145)

## 2019-02-15 LAB — PREPARE PLATELET PHERESIS
Unit division: 0
Unit division: 0

## 2019-02-15 LAB — CULTURE, BLOOD (SINGLE)
Culture: NO GROWTH
Special Requests: ADEQUATE

## 2019-02-15 LAB — PREPARE RBC (CROSSMATCH)

## 2019-02-15 MED ORDER — SODIUM CHLORIDE 0.9% IV SOLUTION: Freq: Once | INTRAVENOUS | Status: DC

## 2019-02-15 NOTE — Progress Notes (Signed)
PROGRESS NOTE  Richard Wallace [id number] DOB: [birth year] DOA: 02/09/2019 PCP: [name], MD  Brief History: 73 y.o.malewith medical history significant ofactive chemotherapy for AML, last tx 2 weeks ago, CKD, GERD, HTN, atrophy of kidney and dc from here 5 days ago where he was admitted for rectal bleeding and severe thrombocytopenia (9000) nowpresenting with fever to 104 since last nightw/o sweats but with rigors, mild nonproductive cough (denies sob) and sore throat. He endorses nausea but this is a chronic finding, not new.He denies diarrhea but has had some loose stools. Denies mucus or blood in the stool currently..Denies chest pain, headache, abdominal pain, dysuria. He denies unusual bruising or petechiae. No further rectal bleeding since the day of dc from here 9/29. He reports feeling profoundly weak without dizziness, no focal weakness. The patient was admitted for treatment of neutropenic fever.  Blood culture showed Streptococcus infantarius.  Because of persistent fevers, general surgery was consulted for Port-A-Cath removal.  Antibiotics were narrowed to ceftriaxone.  Assessment/Plan: neutropenic fever -Blood cultures positive for Streptococcus species bacteremia -pt continues to remain neutropenic and febrile although fever curve is decreasing -Continue supportive care -continue granix -consult surgery to remove port-a-cath due to persistent fever -CT chest/abd/pelvis--no lymphadenopathy. Numerous bilateral pulmonary nodules; no consolidation, no pleural effusion; no hydronephrosis; no hydroureter; diverticulosis; no retroperitoneal lymphadenopathy -case discussed with ID, Dr. Comer--->proceed in stepwise fashion-->remove port-a-cath out first and if fever persists, then pursue treatment/diagnosis of pulm nodules -02/14/19--port-a-cath--removed -check fungal antibodies, serum beta-glucan  Bacteremia -strep infantarius -with persistent fever,  consultedsurgery to remove port-a-cath -02/14/19--port-a-cath--surgically excised-->prelim culture neg -case discussed with Dr. Aviva Signs  leukemia/MDS -appreciate med/onc follow up -case discussed with Dr. Delton Coombes -Patient actively receiving chemotherapyas an outpatient. -Cycle 1 of decitabine and venetoclax on 01/20/2019. -Venetoclax held on 02/03/2019 as his platelet count was low at 9000. -Holding chemotherapeutic agents at this time.  pancytopenia -Patient received 3 units of plateletson admission,as his platelet levelwas7000. 10/8--transfuse 2 more units platelets(7 total for admission) -02/14/19--transfuse 2 more units platelets after surgery (9 units total) -having intermittent rectal bleeding. -transfused1 unit of PRBCs 02/11/19 -02/15/19--transfuse 2 units PRBC  essential hypertension -Currently not taking any medication as an outpatient -Continue to monitor vital signs and initiate antihypertensive management if needed.  Staphylococcus lugdunensisbacteruria -Patient denies dysuria -No treatment will be provided for this isolated microorganism currently as per ID recommendations.  Mucositis  -start magic mouthwash with lidocaine  Lung Nodules -incidental findings on CT chest but may represent opportunistic infection if fever persists     Disposition Plan:Not stable for d/c Family Communication:Wife updatedat bedside 02/15/19  Consultants:Med/onc; general surgery  Code Status: FULL  DVT Prophylaxis: SCDs   Procedures: As Listed in Progress Note Above  Antibiotics: Ceftriaxone 10/5>>>     Subjective: Complains of mouth soreness.  Feels stronger today.  Denies headache, n/v/d, abd pain, cp, sob, hemoptysis.  Had small amount hematochezia on paper.    Objective: Vitals:   02/14/19 2036 02/15/19 0247 02/15/19 0600 02/15/19 1300  BP: 126/65  116/77   Pulse: 92  90   Resp: 20  18   Temp: 100.2 F (37.9 C)  (!) 102.8 F (39.3 C) 99.1 F (37.3 C) 99.5 F (37.5 C)  TempSrc: Oral Oral Oral Oral  SpO2: 95%  96%   Weight:      Height:        Intake/Output Summary (Last 24 hours) at 02/15/2019 1605 Last data filed at 02/14/2019 2036  Gross per 24 hour  Intake 942 ml  Output 150 ml  Net 792 ml   Weight change:  Exam:   General:  Pt is alert, follows commands appropriately, not in acute distress  HEENT: No icterus, No thrush, No neck mass, [place]/AT  Cardiovascular: RRR, S1/S2, no rubs, no gallops  Respiratory: bibasilar crackles, no wheeze  Abdomen: Soft/+BS, non tender, non distended, no guarding  Extremities: No edema, No lymphangitis, No petechiae, No rashes, no synovitis   Data Reviewed: I have personally reviewed following labs and imaging studies Basic Metabolic Panel: Recent Labs  Lab 02/09/19 1330 02/10/19 0527 02/11/19 0542 02/12/19 0501 02/13/19 0641 02/14/19 0450 02/15/19 0619  NA 136 138 136  --  137  --  138  K 4.1 3.8 4.0  --  4.9  --  4.3  CL 102 106 107  --  102  --  103  CO2 23 22 24   --  27  --  27  GLUCOSE 116* 101* 103*  --  103*  --  105*  BUN 28* 23 24*  --  26*  --  27*  CREATININE 1.32* 1.04 1.02 0.94 1.10 1.27* 1.08  CALCIUM 9.1 8.2* 8.5*  --  9.0  --  9.0   Liver Function Tests: Recent Labs  Lab 02/09/19 1330 02/13/19 0641  AST 21 29  ALT 24 44  ALKPHOS 55 70  BILITOT 3.1* 1.3*  PROT 7.0 6.1*  ALBUMIN 3.9 2.9*   No results for input(s): LIPASE, AMYLASE in the last 168 hours. No results for input(s): AMMONIA in the last 168 hours. Coagulation Profile: Recent Labs  Lab 02/09/19 1330  INR 1.1   CBC: Recent Labs  Lab 02/10/19 0845 02/11/19 0542 02/12/19 0501 02/13/19 0641 02/14/19 0458 02/15/19 0619  WBC 0.6* 0.4* 0.4* 0.6* 0.6* 0.8*  NEUTROABS 0.0* 0.0* 0.0* 0.0*  --  0.3*  HGB 7.2* 6.7* 7.5* 7.4* 7.1* 6.8*  HCT 25.8* 23.5* 25.5* 25.4* 24.2* 23.0*  MCV 110.3* 106.3* 103.7* 104.5* 104.3* 103.1*  PLT 40* 27* 17* 20* 25* 65*    Cardiac Enzymes: No results for input(s): CKTOTAL, CKMB, CKMBINDEX, TROPONINI in the last 168 hours. BNP: Invalid input(s): POCBNP CBG: No results for input(s): GLUCAP in the last 168 hours. HbA1C: No results for input(s): HGBA1C in the last 72 hours. Urine analysis:    Component Value Date/Time   COLORURINE AMBER (A) 02/09/2019 1545   APPEARANCEUR CLEAR 02/09/2019 1545   LABSPEC 1.024 02/09/2019 1545   PHURINE 5.0 02/09/2019 1545   GLUCOSEU NEGATIVE 02/09/2019 1545   HGBUR SMALL (A) 02/09/2019 1545   BILIRUBINUR NEGATIVE 02/09/2019 1545   KETONESUR NEGATIVE 02/09/2019 1545   PROTEINUR 100 (A) 02/09/2019 1545   UROBILINOGEN 1.0 08/24/2014 1630   NITRITE NEGATIVE 02/09/2019 1545   LEUKOCYTESUR NEGATIVE 02/09/2019 1545   Sepsis Labs: @LABRCNTIP (procalcitonin:4,lacticidven:4) ) Recent Results (from the past 240 hour(s))  Blood Culture (routine x 2)     Status: Abnormal   Collection Time: 02/09/19  1:30 PM   Specimen: Right Antecubital; Blood  Result Value Ref Range Status   Specimen Description   Final    RIGHT ANTECUBITAL Performed at [facility], [address], [place], [place] [address]    Special Requests   Final    BOTTLES DRAWN AEROBIC AND ANAEROBIC Blood Culture results may not be optimal due to an excessive volume of blood received in culture bottles Performed at [facility], [address] [place], [place] [address]    Culture  Setup Time   Final    GRAM POSITIVE COCCI BONDURANT,R @ 0210 ON 02/10/19 BY JUW ANAEROBIC BOTTLE ONLY GS DONE @ [facility] CRITICAL RESULT CALLED TO, READ BACK BY AND VERIFIED WITH: PHARMD STEVENS H. [id number] FCP Performed at [facility], [place] [address] [name], [place] [address]    Culture STREPTOCOCCUS INFANTARIUS (A)  Final   Report Status 02/12/2019 FINAL  Final   Organism ID, Bacteria STREPTOCOCCUS INFANTARIUS  Final      Susceptibility   Streptococcus infantarius - MIC*    PENICILLIN <=0.06 SENSITIVE Sensitive      CEFTRIAXONE <=0.12 SENSITIVE Sensitive     ERYTHROMYCIN <=0.12 SENSITIVE Sensitive     LEVOFLOXACIN 4 INTERMEDIATE Intermediate     VANCOMYCIN 0.5 SENSITIVE Sensitive     * STREPTOCOCCUS INFANTARIUS  Blood Culture ID Panel (Reflexed)     Status: Abnormal   Collection Time: 02/09/19  1:30 PM  Result Value Ref Range Status   Enterococcus species NOT DETECTED NOT DETECTED Final   Listeria monocytogenes NOT DETECTED NOT DETECTED Final   Staphylococcus species NOT DETECTED NOT DETECTED Final   Staphylococcus aureus (BCID) NOT DETECTED NOT DETECTED Final   Streptococcus species DETECTED (A) NOT DETECTED Final    Comment: Not Enterococcus species, Streptococcus agalactiae, Streptococcus pyogenes, or Streptococcus pneumoniae. CRITICAL RESULT CALLED TO, READ BACK BY AND VERIFIED WITH: PHARMD STEVENS H. [phone] FCP    Streptococcus agalactiae NOT DETECTED NOT DETECTED Final   Streptococcus pneumoniae NOT DETECTED NOT DETECTED Final   Streptococcus pyogenes NOT DETECTED NOT DETECTED Final   Acinetobacter baumannii NOT DETECTED NOT DETECTED Final   Enterobacteriaceae species NOT DETECTED NOT DETECTED Final   Enterobacter cloacae complex NOT DETECTED NOT DETECTED Final   Escherichia coli NOT DETECTED NOT DETECTED Final   Klebsiella oxytoca NOT DETECTED NOT DETECTED Final   Klebsiella pneumoniae NOT DETECTED NOT DETECTED Final   Proteus species NOT DETECTED NOT DETECTED Final   Serratia marcescens NOT DETECTED NOT DETECTED Final   Haemophilus influenzae NOT DETECTED NOT DETECTED Final   Neisseria meningitidis NOT DETECTED NOT DETECTED Final   Pseudomonas aeruginosa NOT DETECTED NOT DETECTED Final   Candida albicans NOT DETECTED NOT DETECTED Final   Candida glabrata NOT DETECTED NOT DETECTED Final   Candida krusei NOT DETECTED NOT DETECTED Final   Candida parapsilosis NOT DETECTED NOT DETECTED Final   Candida tropicalis NOT DETECTED NOT DETECTED Final    Comment: Performed at [facility], [place]. [address] [place], [place] [address]  Blood Culture (routine x 2)     Status: Abnormal   Collection Time: 02/09/19  1:33 PM   Specimen: BLOOD RIGHT HAND  Result Value Ref Range Status   Specimen Description   Final    BLOOD RIGHT HAND Performed at [facility], [address] [place], [place] [address]    Special Requests   Final    BOTTLES DRAWN AEROBIC AND ANAEROBIC Blood Culture adequate volume Performed at [facility], [address], [place], [place] [address]    Culture  Setup Time   Final    GRAM POSITIVE COCCI Gram Stain Report Called to,Read Back By and Verified With: BONDURANT,R @ 0210 ON 02/10/19 BY JUW ANAEROBIC BOTTLE ONLY GS DONE @ [facility]    Culture (A)  Final    STREPTOCOCCUS INFANTARIUS SUSCEPTIBILITIES PERFORMED ON PREVIOUS CULTURE WITHIN THE LAST 5 DAYS. Performed at [facility], [place] [address], [place],  [address]    Report Status 02/12/2019  FINAL  Final  SARS Coronavirus 2 [facility] order, Performed in [facility] hospital lab) Nasopharyngeal Nasopharyngeal Swab     Status: None   Collection Time: 02/09/19  2:00 PM   Specimen: Nasopharyngeal Swab  Result Value Ref Range Status   SARS Coronavirus 2 NEGATIVE NEGATIVE Final    Comment: (NOTE) If result is NEGATIVE SARS-CoV-2 target nucleic acids are NOT DETECTED. The SARS-CoV-2 RNA is generally detectable in upper and lower  respiratory specimens during the acute phase of infection. The lowest  concentration of SARS-CoV-2 viral copies this assay can detect is 250  copies / mL. A negative result does not preclude SARS-CoV-2 infection  and should not be used as the sole basis for treatment or other  patient management decisions.  A negative result may occur with  improper specimen collection / handling, submission of specimen other  than nasopharyngeal swab, presence of viral mutation(s) within the  areas targeted by this assay, and inadequate number of viral copies  (<250 copies /  mL). A negative result must be combined with clinical  observations, patient history, and epidemiological information. If result is POSITIVE SARS-CoV-2 target nucleic acids are DETECTED. The SARS-CoV-2 RNA is generally detectable in upper and lower  respiratory specimens dur ing the acute phase of infection.  Positive  results are indicative of active infection with SARS-CoV-2.  Clinical  correlation with patient history and other diagnostic information is  necessary to determine patient infection status.  Positive results do  not rule out bacterial infection or co-infection with other viruses. If result is PRESUMPTIVE POSTIVE SARS-CoV-2 nucleic acids MAY BE PRESENT.   A presumptive positive result was obtained on the submitted specimen  and confirmed on repeat testing.  While 2019 novel coronavirus  (SARS-CoV-2) nucleic acids may be present in the submitted sample  additional confirmatory testing may be necessary for epidemiological  and / or clinical management purposes  to differentiate between  SARS-CoV-2 and other Sarbecovirus currently known to infect humans.  If clinically indicated additional testing with an alternate test  methodology [phone]) is advised. The SARS-CoV-2 RNA is generally  detectable in upper and lower respiratory sp ecimens during the acute  phase of infection. The expected result is Negative. Fact Sheet for Patients:  StrictlyIdeas.no Fact Sheet for Healthcare Providers: BankingDealers.co.za This test is not yet approved or cleared by the [place] FDA and has been authorized for detection and/or diagnosis of SARS-CoV-2 by FDA under an Emergency Use Authorization (EUA).  This EUA will remain in effect (meaning this test can be used) for the duration of the COVID-19 declaration under Section 564(b)(1) of the Act, 21 U.S.C. section 360bbb-3(b)(1), unless the authorization is terminated or revoked  sooner. Performed at [facility], [address], [place], [place] [address]   Urine culture     Status: Abnormal   Collection Time: 02/09/19  3:45 PM   Specimen: In/Out Cath Urine  Result Value Ref Range Status   Specimen Description   Final    IN/OUT CATH URINE Performed at [facility], [address], [place], [place] [address]    Special Requests   Final    Normal Performed at [facility], [address] [place], [place] [address]    Culture 10,000 COLONIES/mL STAPHYLOCOCCUS LUGDUNENSIS (A)  Final   Report Status 02/11/2019 FINAL  Final   Organism ID, Bacteria STAPHYLOCOCCUS LUGDUNENSIS (A)  Final      Susceptibility   Staphylococcus lugdunensis - MIC*    CIPROFLOXACIN <=0.5 SENSITIVE Sensitive  GENTAMICIN <=0.5 SENSITIVE Sensitive     NITROFURANTOIN <=16 SENSITIVE Sensitive     OXACILLIN >=4 RESISTANT Resistant     TETRACYCLINE 2 SENSITIVE Sensitive     VANCOMYCIN <=0.5 SENSITIVE Sensitive     TRIMETH/SULFA <=10 SENSITIVE Sensitive     CLINDAMYCIN >=8 RESISTANT Resistant     RIFAMPIN <=0.5 SENSITIVE Sensitive     Inducible Clindamycin NEGATIVE Sensitive     * 10,000 COLONIES/mL STAPHYLOCOCCUS LUGDUNENSIS  Culture, blood (single)     Status: None   Collection Time: 02/10/19  5:27 AM   Specimen: Porta Cath; Blood  Result Value Ref Range Status   Specimen Description PORTA CATH BOTTLES DRAWN AEROBIC AND ANAEROBIC  Final   Special Requests Blood Culture adequate volume  Final   Culture   Final    NO GROWTH 5 DAYS Performed at [facility], [address] [place], [place] [address]    Report Status 02/15/2019 FINAL  Final  Culture, blood (Routine X 2) w Reflex to ID Panel     Status: None (Preliminary result)   Collection Time: 02/11/19  2:13 PM   Specimen: BLOOD RIGHT WRIST  Result Value Ref Range Status   Specimen Description BLOOD RIGHT WRIST  Final   Special Requests   Final    BOTTLES DRAWN AEROBIC AND ANAEROBIC Blood Culture results may not be optimal due to  an excessive volume of blood received in culture bottles   Culture   Final    NO GROWTH 4 DAYS Performed at [facility], [address], [place], [place] [address]    Report Status PENDING  Incomplete  Culture, blood (Routine X 2) w Reflex to ID Panel     Status: None (Preliminary result)   Collection Time: 02/11/19  2:18 PM   Specimen: BLOOD LEFT ARM  Result Value Ref Range Status   Specimen Description BLOOD LEFT ARM  Final   Special Requests   Final    BOTTLES DRAWN AEROBIC AND ANAEROBIC Blood Culture results may not be optimal due to an excessive volume of blood received in culture bottles   Culture   Final    NO GROWTH 4 DAYS Performed at [facility], [address], [place], [place] [address]    Report Status PENDING  Incomplete  MRSA PCR Screening     Status: None   Collection Time: 02/12/19  7:38 PM   Specimen: Nasopharyngeal  Result Value Ref Range Status   MRSA by PCR NEGATIVE NEGATIVE Final    Comment:        The GeneXpert MRSA Assay (FDA approved for NASAL specimens only), is one component of a comprehensive MRSA colonization surveillance program. It is not intended to diagnose MRSA infection nor to guide or monitor treatment for MRSA infections. Performed at [facility], [address] [place], [place] [address]   Aspergillus Ag, BAL/Serum     Status: None   Collection Time: 02/13/19  6:41 AM  Result Value Ref Range Status   Aspergillus Ag, BAL/Serum 0.04 0.00 - 0.49 Index Final    Comment: (NOTE) Performed At: [facility] [address] [place], [place] [id number] Rush Farmer MD [id number] Performed At: [facility] [place], [place] [id number] Katina Degree MDPhD [id number]   Respiratory Panel by PCR     Status: None   Collection Time: 02/13/19  5:12 PM   Specimen: Nasopharyngeal Swab; Respiratory  Result Value Ref Range Status   Adenovirus NOT DETECTED NOT DETECTED Final   Coronavirus  229E NOT DETECTED NOT DETECTED  Final    Comment: (NOTE) The Coronavirus on the Respiratory Panel, DOES NOT test for the novel  Coronavirus (2019 nCoV)    Coronavirus HKU1 NOT DETECTED NOT DETECTED Final   Coronavirus NL63 NOT DETECTED NOT DETECTED Final   Coronavirus OC43 NOT DETECTED NOT DETECTED Final   Metapneumovirus NOT DETECTED NOT DETECTED Final   Rhinovirus / Enterovirus NOT DETECTED NOT DETECTED Final   Influenza A NOT DETECTED NOT DETECTED Final   Influenza B NOT DETECTED NOT DETECTED Final   Parainfluenza Virus 1 NOT DETECTED NOT DETECTED Final   Parainfluenza Virus 2 NOT DETECTED NOT DETECTED Final   Parainfluenza Virus 3 NOT DETECTED NOT DETECTED Final   Parainfluenza Virus 4 NOT DETECTED NOT DETECTED Final   Respiratory Syncytial Virus NOT DETECTED NOT DETECTED Final   Bordetella pertussis NOT DETECTED NOT DETECTED Final   Chlamydophila pneumoniae NOT DETECTED NOT DETECTED Final   Mycoplasma pneumoniae NOT DETECTED NOT DETECTED Final    Comment: Performed at [facility], [place] [address] [place], [place] [address]  Cath Tip Culture     Status: None (Preliminary result)   Collection Time: 02/14/19 10:22 AM   Specimen: Catheter Tip  Result Value Ref Range Status   Specimen Description CATH TIP  Final   Special Requests CATH TIP RECEIVED IN COPAN SWAB  Final   Culture   Final    NO GROWTH < 24 HOURS Performed at [facility], [place] [address] [place], [place] [address]    Report Status PENDING  Incomplete     Scheduled Meds:  sodium chloride   Intravenous Once   sodium chloride   Intravenous Once   sodium chloride   Intravenous Once   allopurinol  300 mg Oral QPM   docusate sodium  100 mg Oral QHS   loratadine  10 mg Oral Daily   magic mouthwash w/lidocaine  10 mL Oral QID   magnesium oxide  400 mg Oral QHS   Tbo-Filgrastim  480 mcg Subcutaneous Daily   Continuous Infusions:  sodium chloride     cefTRIAXone (ROCEPHIN)  IV 2 g (02/15/19 1301)     Procedures/Studies: Ct Chest W Contrast  Result Date: 02/12/2019 CLINICAL DATA:  Fever of unknown origin. Cough with loose stools. AML. EXAM: CT CHEST, ABDOMEN, AND PELVIS WITH CONTRAST TECHNIQUE: Multidetector CT imaging of the chest, abdomen and pelvis was performed following the standard protocol during bolus administration of intravenous contrast. CONTRAST:  182mL OMNIPAQUE IOHEXOL 300 MG/ML SOLN, 76mL OMNIPAQUE IOHEXOL 300 MG/ML SOLN COMPARISON:  CT urogram 09/29/2014. FINDINGS: CT CHEST FINDINGS Cardiovascular: The heart size is normal. No substantial pericardial effusion. No thoracic aortic aneurysm. Right Port-A-Cath tip is positioned in the mid SVC. Mediastinum/Nodes: No mediastinal lymphadenopathy. There is no hilar lymphadenopathy. The esophagus has normal imaging features. There is no axillary lymphadenopathy. Lungs/Pleura: Numerous irregular bilateral pulmonary nodules are identified. One of the more dominant nodules is seen in the left upper lobe on 35/3, measuring 1.4 cm diameter. Index nodule right upper lobe on 75/3 measures 8 mm. Left lower lobe index nodule on 94/3 is 10 mm. No focal airspace consolidation. No pleural effusion. Musculoskeletal: No worrisome lytic or sclerotic osseous abnormality. CT ABDOMEN PELVIS FINDINGS Hepatobiliary: The liver shows diffusely decreased attenuation suggesting fat deposition. No suspicious focal abnormality within the liver parenchyma. Gallbladder is nondistended. No intrahepatic or extrahepatic biliary dilation. Pancreas: No focal mass lesion. No dilatation of the main duct. No intraparenchymal cyst. No  peripancreatic edema. Spleen: No splenomegaly. No focal mass lesion. Adrenals/Urinary Tract: No adrenal nodule or mass. Left kidney is markedly atrophic with 3.6 cm interpolar cystic lesion similar to prior study. 5.7 cm cystic lesion anterior interpolar right kidney was 5.4 cm previously. 14 mm low-density lesion upper interpolar right kidney (69/2)  was present previously and is not substantially changed. 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 8 mm nonobstructing stone identified lower pole right kidney. No evidence for hydroureter. Mild but asymmetric left bladder wall thickening evident (image 112/2. This was probably present on the previous study but less conspicuous. Stomach/Bowel: Stomach is unremarkable. No gastric wall thickening. No evidence of outlet obstruction. Duodenum is normally positioned as is the ligament of Treitz. Large duodenal diverticulum evident. No small bowel wall thickening. No small bowel dilatation. The terminal ileum is normal. The appendix is normal. No gross colonic mass. No colonic wall thickening. Diverticuli are seen scattered along the entire length of the colon without CT findings of diverticulitis. Vascular/Lymphatic: There is abdominal aortic atherosclerosis without aneurysm. There is no gastrohepatic or hepatoduodenal ligament lymphadenopathy. No intraperitoneal or retroperitoneal lymphadenopathy. No pelvic sidewall lymphadenopathy. Reproductive: Prostate gland is enlarged. Other: No intraperitoneal free fluid. Musculoskeletal: No worrisome lytic or sclerotic osseous abnormality. Status post lumbar fusion. IMPRESSION: 1. Numerous bilateral irregular/spiculated pulmonary nodules measuring up to 14 mm. Imaging features are nonspecific and could be related to an infectious etiology or neoplasm. 2. Mild asymmetric left bladder wall thickening, likely present on the study from over 4 years ago although less conspicuous on that exam due to lack of intravenous contrast administration. 3. Hepatic steatosis. 4. Bilateral renal cysts with 8 mm nonobstructing right renal stone. 5.  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   On: 02/12/2019 20:57   Ct Abdomen Pelvis W Contrast  Result Date: 02/12/2019 CLINICAL DATA:  Fever of unknown origin. Cough with loose stools. AML.  EXAM: CT CHEST, ABDOMEN, AND PELVIS WITH CONTRAST TECHNIQUE: Multidetector CT imaging of the chest, abdomen and pelvis was performed following the standard protocol during bolus administration of intravenous contrast. CONTRAST:  110mL OMNIPAQUE IOHEXOL 300 MG/ML SOLN, 36mL OMNIPAQUE IOHEXOL 300 MG/ML SOLN COMPARISON:  CT urogram 09/29/2014. FINDINGS: CT CHEST FINDINGS Cardiovascular: The heart size is normal. No substantial pericardial effusion. No thoracic aortic aneurysm. Right Port-A-Cath tip is positioned in the mid SVC. Mediastinum/Nodes: No mediastinal lymphadenopathy. There is no hilar lymphadenopathy. The esophagus has normal imaging features. There is no axillary lymphadenopathy. Lungs/Pleura: Numerous irregular bilateral pulmonary nodules are identified. One of the more dominant nodules is seen in the left upper lobe on 35/3, measuring 1.4 cm diameter. Index nodule right upper lobe on 75/3 measures 8 mm. Left lower lobe index nodule on 94/3 is 10 mm. No focal airspace consolidation. No pleural effusion. Musculoskeletal: No worrisome lytic or sclerotic osseous abnormality. CT ABDOMEN PELVIS FINDINGS Hepatobiliary: The liver shows diffusely decreased attenuation suggesting fat deposition. No suspicious focal abnormality within the liver parenchyma. Gallbladder is nondistended. No intrahepatic or extrahepatic biliary dilation. Pancreas: No focal mass lesion. No dilatation of the main duct. No intraparenchymal cyst. No peripancreatic edema. Spleen: No splenomegaly. No focal mass lesion. Adrenals/Urinary Tract: No adrenal nodule or mass. Left kidney is markedly atrophic with 3.6 cm interpolar cystic lesion similar to prior study. 5.7 cm cystic lesion anterior interpolar right kidney was 5.4 cm previously. 14 mm low-density lesion upper interpolar right kidney (69/2) was present previously and is not substantially changed. Central sinus  cysts are also noted in the right kidney. 8 mm nonobstructing stone  identified lower pole right kidney. No evidence for hydroureter. Mild but asymmetric left bladder wall thickening evident (image 112/2. This was probably present on the previous study but less conspicuous. Stomach/Bowel: Stomach is unremarkable. No gastric wall thickening. No evidence of outlet obstruction. Duodenum is normally positioned as is the ligament of Treitz. Large duodenal diverticulum evident. No small bowel wall thickening. No small bowel dilatation. The terminal ileum is normal. The appendix is normal. No gross colonic mass. No colonic wall thickening. Diverticuli are seen scattered along the entire length of the colon without CT findings of diverticulitis. Vascular/Lymphatic: There is abdominal aortic atherosclerosis without aneurysm. There is no gastrohepatic or hepatoduodenal ligament lymphadenopathy. No intraperitoneal or retroperitoneal lymphadenopathy. No pelvic sidewall lymphadenopathy. Reproductive: Prostate gland is enlarged. Other: No intraperitoneal free fluid. Musculoskeletal: No worrisome lytic or sclerotic osseous abnormality. Status post lumbar fusion. IMPRESSION: 1. Numerous bilateral irregular/spiculated pulmonary nodules measuring up to 14 mm. Imaging features are nonspecific and could be related to an infectious etiology or neoplasm. 2. Mild asymmetric left bladder wall thickening, likely present on the study from over 4 years ago although less conspicuous on that exam due to lack of intravenous contrast administration. 3. Hepatic steatosis. 4. Bilateral renal cysts with 8 mm nonobstructing right renal stone. 5.  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   On: 02/12/2019 20:57   Dg Chest Port 1 View  Result Date: 02/09/2019 CLINICAL DATA:  Fever and cough. Patient has leukemia receiving chemotherapy. EXAM: PORTABLE CHEST 1 VIEW COMPARISON:  08/28/2018 FINDINGS: Right subclavian Port-A-Cath unchanged. Lungs are adequately inflated without  focal airspace consolidation or effusion. Cardiomediastinal silhouette and remainder the exam is unchanged. IMPRESSION: No active disease. Electronically Signed   By: Marin Olp M.D.   On: 02/09/2019 14:26    Orson Eva, DO  [facility] Pager [phone]  If 7PM-7AM, please contact night-coverage [email or web address] Password [email or web address] 02/15/2019, 4:05 PM   LOS: 6 days

## 2019-02-15 NOTE — Progress Notes (Signed)
1 Day Post-Op  Subjective: Patient more alert this morning.  Denies any incisional pain.  Objective: Vital signs in last 24 hours: Temp:  [98.5 F (36.9 C)-102.8 F (39.3 C)] 99.1 F (37.3 C) (10/10 0600) Pulse Rate:  [75-104] 90 (10/10 0600) Resp:  [16-20] 18 (10/10 0600) BP: (116-154)/(65-79) 116/77 (10/10 0600) SpO2:  [94 %-98 %] 96 % (10/10 0600) Last BM Date: 02/12/19  Intake/Output from previous day: 10/09 0701 - 10/10 0700 In: 1892.4 [P.O.:920; I.V.:1.8; Blood:917; IV Piggyback:53.6] Out: 1000 [Urine:1000] Intake/Output this shift: No intake/output data recorded.  General appearance: alert, cooperative and no distress Skin: Dressing in the right subclavian area dry and intact.  No hematoma present.  Lab Results:  Recent Labs    02/14/19 0458 02/15/19 0619  WBC 0.6* 0.8*  HGB 7.1* 6.8*  HCT 24.2* 23.0*  PLT 25* 65*   BMET Recent Labs    02/13/19 0641 02/14/19 0450 02/15/19 0619  NA 137  --  138  K 4.9  --  4.3  CL 102  --  103  CO2 27  --  27  GLUCOSE 103*  --  105*  BUN 26*  --  27*  CREATININE 1.10 1.27* 1.08  CALCIUM 9.0  --  9.0   PT/INR No results for input(s): LABPROT, INR in the last 72 hours.  Studies/Results: No results found.  Anti-infectives: Anti-infectives (From admission, onward)   Start     Dose/Rate Route Frequency Ordered Stop   02/14/19 2200  voriconazole (VFEND) 400 mg in sodium chloride 0.9 % 100 mL IVPB  Status:  Discontinued     400 mg 70 mL/hr over 120 Minutes Intravenous Every 12 hours 02/13/19 1821 02/14/19 0904   02/13/19 1900  voriconazole (VFEND) 600 mg in sodium chloride 0.9 % 150 mL IVPB  Status:  Discontinued     600 mg 105 mL/hr over 120 Minutes Intravenous Every 12 hours 02/13/19 1821 02/14/19 0904   02/10/19 1600  vancomycin (VANCOCIN) 1,500 mg in sodium chloride 0.9 % 500 mL IVPB  Status:  Discontinued     1,500 mg 250 mL/hr over 120 Minutes Intravenous Every 24 hours 02/09/19 1936 02/10/19 0913   02/10/19  1200  cefTRIAXone (ROCEPHIN) 2 g in sodium chloride 0.9 % 100 mL IVPB     2 g 200 mL/hr over 30 Minutes Intravenous Every 24 hours 02/10/19 0913     02/09/19 2200  ceFEPIme (MAXIPIME) 2 g in sodium chloride 0.9 % 100 mL IVPB  Status:  Discontinued     2 g 200 mL/hr over 30 Minutes Intravenous Every 8 hours 02/09/19 1925 02/10/19 0913   02/09/19 1745  vancomycin (VANCOCIN) 1,500 mg in sodium chloride 0.9 % 500 mL IVPB  Status:  Discontinued     1,500 mg 250 mL/hr over 120 Minutes Intravenous  Once 02/09/19 1733 02/09/19 1734   02/09/19 1745  vancomycin (VANCOCIN) IVPB 1000 mg/200 mL premix     1,000 mg 100 mL/hr over 120 Minutes Intravenous  Once 02/09/19 1734 02/09/19 2019   02/09/19 1345  vancomycin (VANCOCIN) IVPB 1000 mg/200 mL premix     1,000 mg 200 mL/hr over 60 Minutes Intravenous  Once 02/09/19 1331 02/09/19 1513   02/09/19 1345  ceFEPIme (MAXIPIME) 2 g in sodium chloride 0.9 % 100 mL IVPB     2 g 200 mL/hr over 30 Minutes Intravenous  Once 02/09/19 1331 02/09/19 1435      Assessment/Plan: s/p Procedure(s): MINOR REMOVAL PORT-A-CATH Impression: Doing well, status post Port-A-Cath  removal.  Catheter tip culture pending.  No active bleeding noted at surgical site.  LOS: 6 days    [facility] Signs 02/15/2019

## 2019-02-15 NOTE — Plan of Care (Signed)
  Problem: Education: Goal: Knowledge of General Education information will improve Description Including pain rating scale, medication(s)/side effects and non-pharmacologic comfort measures Outcome: Progressing   Problem: Health Behavior/Discharge Planning: Goal: Ability to manage health-related needs will improve Outcome: Progressing   

## 2019-02-16 LAB — CBC
HCT: 31.2 % — ABNORMAL LOW (ref 39.0–52.0)
Hemoglobin: 9.3 g/dL — ABNORMAL LOW (ref 13.0–17.0)
MCH: 29.8 pg (ref 26.0–34.0)
MCHC: 29.8 g/dL — ABNORMAL LOW (ref 30.0–36.0)
MCV: 100 fL (ref 80.0–100.0)
Platelets: 58 10*3/uL — ABNORMAL LOW (ref 150–400)
RBC: 3.12 MIL/uL — ABNORMAL LOW (ref 4.22–5.81)
RDW: 18.5 % — ABNORMAL HIGH (ref 11.5–15.5)
WBC: 3.8 10*3/uL — ABNORMAL LOW (ref 4.0–10.5)
nRBC: 1.3 % — ABNORMAL HIGH (ref 0.0–0.2)

## 2019-02-16 LAB — TYPE AND SCREEN
ABO/RH(D): O POS
Antibody Screen: NEGATIVE
Unit division: 0
Unit division: 0

## 2019-02-16 LAB — BPAM RBC
Blood Product Expiration Date: 202010292359
Blood Product Expiration Date: 202011122359
ISSUE DATE / TIME: 202010101741
ISSUE DATE / TIME: 202010102033
Unit Type and Rh: 5100
Unit Type and Rh: 9500

## 2019-02-16 NOTE — Progress Notes (Signed)
PROGRESS NOTE  Richard Wallace [id number] DOB: 06-May-1946 DOA: 02/09/2019 PCP: [name], MD  Brief History: 73 y.o.malewith medical history significant ofactive chemotherapy for AML, last tx 2 weeks ago, CKD, GERD, HTN, atrophy of kidney and dc from here 5 days ago where he was admitted for rectal bleeding and severe thrombocytopenia (9000) nowpresenting with fever to 104 since last nightw/o sweats but with rigors, mild nonproductive cough (denies sob) and sore throat. He endorses nausea but this is a chronic finding, not new.He denies diarrhea but has had some loose stools. Denies mucus or blood in the stool currently..Denies chest pain, headache, abdominal pain, dysuria. He denies unusual bruising or petechiae. No further rectal bleeding since the day of dc from here 9/29. He reports feeling profoundly weak without dizziness, no focal weakness. The patient was admitted for treatment of neutropenic fever. Blood culture showed Streptococcus infantarius. Because of persistent fevers, general surgery was consulted for Port-A-Cath removal. Antibiotics were narrowed to ceftriaxone.  Assessment/Plan: neutropenic fever -Blood cultures positive for Streptococcus species bacteremia -pt continues to remain neutropenic and febrile although fever curve is decreasing -Continue supportive care -continue granix -consult surgery to remove port-a-cath due to persistent fever -CT chest/abd/pelvis--no lymphadenopathy. Numerous bilateral pulmonary nodules; no consolidation, no pleural effusion; no hydronephrosis; no hydroureter; diverticulosis; no retroperitoneal lymphadenopathy -case discussed withID, Dr. Comer--->proceed in stepwise fashion-->remove port-a-cath out first and if fever persists, then pursue treatment/diagnosis of pulm nodules -02/14/19--port-a-cath--removed -checked fungal antibodies, serum beta-glucan  Bacteremia -strep infantarius -with persistent fever,  consultedsurgery to remove port-a-cath -02/14/19--port-a-cath--surgically excised-->prelim culture neg -case discussed with Dr. Aviva Signs  leukemia/MDS -appreciate med/onc follow up -case discussed with Dr. Delton Coombes -Patient actively receiving chemotherapyas an outpatient. -Cycle 1 of decitabine and venetoclax on 01/20/2019. -Venetoclax held on 02/03/2019 as his platelet count was low at 9000. -Holding chemotherapeutic agents at this time.  pancytopenia -Patient received 3 units of plateletson admission,as his platelet levelwas7000. 10/8--transfuse 2 more units platelets(7 total for admission) -02/14/19--transfuse 2 more units platelets after surgery (9 units total) -having intermittent rectal bleeding. -transfused1 unit of PRBCs 02/11/19 -02/15/19--transfuse 2 units PRBC  essential hypertension -Currently not taking any medication as an outpatient -Continue to monitor vital signs and initiate antihypertensive management if needed.  Staphylococcus lugdunensisbacteruria -Patient denies dysuria -No treatment will be provided for this isolated microorganism currently as per ID recommendations.  Mucositis  -start magic mouthwash with lidocaine  Lung Nodules -incidental findings on CT chestbut may represent opportunistic infection if fever persists     Disposition Plan:Not stable for d/c Family Communication:Wife updatedat bedside 02/16/19  Consultants:Med/onc; general surgery  Code Status: FULL  DVT Prophylaxis: SCDs   Procedures: As Listed in Progress Note Above  Antibiotics: Ceftriaxone 10/5>>>     Subjective: Complains of mouth soreness.  Feels stronger today.  Denies headache, n/v/d, abd pain, cp, sob, hemoptysis. Had 2 BMs today without hematochezia.  Objective: Vitals:   02/15/19 2100 02/16/19 0026 02/16/19 0534 02/16/19 1430  BP: 138/74 (!) 150/75 130/75 136/81  Pulse: 84 84 86 85  Resp: 18  18 17   Temp:  99.1 F (37.3 C) 99.5 F (37.5 C) 99.1 F (37.3 C) 99.2 F (37.3 C)  TempSrc: Oral Oral Oral   SpO2: 97% 96% 94% 99%  Weight:      Height:        Intake/Output Summary (Last 24 hours) at 02/16/2019 1608 Last data filed at 02/16/2019 0026 Gross per 24 hour  Intake 1553 ml  Output --  Net 1553 ml   Weight change:  Exam:   General:  Pt is alert, follows commands appropriately, not in acute distress  HEENT: No icterus, No thrush, No neck mass, [place]/AT  Cardiovascular: RRR, S1/S2, no rubs, no gallops  Respiratory: fine bibasilar crackles. No wheeze  Abdomen: Soft/+BS, non tender, non distended, no guarding  Extremities: No edema, No lymphangitis, No petechiae, No rashes, no synovitis   Data Reviewed: I have personally reviewed following labs and imaging studies Basic Metabolic Panel: Recent Labs  Lab 02/10/19 0527 02/11/19 0542 02/12/19 0501 02/13/19 0641 02/14/19 0450 02/15/19 0619  NA 138 136  --  137  --  138  K 3.8 4.0  --  4.9  --  4.3  CL 106 107  --  102  --  103  CO2 22 24  --  27  --  27  GLUCOSE 101* 103*  --  103*  --  105*  BUN 23 24*  --  26*  --  27*  CREATININE 1.04 1.02 0.94 1.10 1.27* 1.08  CALCIUM 8.2* 8.5*  --  9.0  --  9.0   Liver Function Tests: Recent Labs  Lab 02/13/19 0641  AST 29  ALT 44  ALKPHOS 70  BILITOT 1.3*  PROT 6.1*  ALBUMIN 2.9*   No results for input(s): LIPASE, AMYLASE in the last 168 hours. No results for input(s): AMMONIA in the last 168 hours. Coagulation Profile: No results for input(s): INR, PROTIME in the last 168 hours. CBC: Recent Labs  Lab 02/10/19 0845 02/11/19 0542 02/12/19 0501 02/13/19 0641 02/14/19 0458 02/15/19 0619  WBC 0.6* 0.4* 0.4* 0.6* 0.6* 0.8*  NEUTROABS 0.0* 0.0* 0.0* 0.0*  --  0.3*  HGB 7.2* 6.7* 7.5* 7.4* 7.1* 6.8*  HCT 25.8* 23.5* 25.5* 25.4* 24.2* 23.0*  MCV 110.3* 106.3* 103.7* 104.5* 104.3* 103.1*  PLT 40* 27* 17* 20* 25* 65*   Cardiac Enzymes: No results for input(s):  CKTOTAL, CKMB, CKMBINDEX, TROPONINI in the last 168 hours. BNP: Invalid input(s): POCBNP CBG: No results for input(s): GLUCAP in the last 168 hours. HbA1C: No results for input(s): HGBA1C in the last 72 hours. Urine analysis:    Component Value Date/Time   COLORURINE AMBER (A) 02/09/2019 1545   APPEARANCEUR CLEAR 02/09/2019 1545   LABSPEC 1.024 02/09/2019 1545   PHURINE 5.0 02/09/2019 1545   GLUCOSEU NEGATIVE 02/09/2019 1545   HGBUR SMALL (A) 02/09/2019 1545   BILIRUBINUR NEGATIVE 02/09/2019 1545   KETONESUR NEGATIVE 02/09/2019 1545   PROTEINUR 100 (A) 02/09/2019 1545   UROBILINOGEN 1.0 08/24/2014 1630   NITRITE NEGATIVE 02/09/2019 1545   LEUKOCYTESUR NEGATIVE 02/09/2019 1545   Sepsis Labs: @LABRCNTIP (procalcitonin:4,lacticidven:4) ) Recent Results (from the past 240 hour(s))  Blood Culture (routine x 2)     Status: Abnormal   Collection Time: 02/09/19  1:30 PM   Specimen: Right Antecubital; Blood  Result Value Ref Range Status   Specimen Description   Final    RIGHT ANTECUBITAL Performed at [facility], [address] [place], [place] [address]    Special Requests   Final    BOTTLES DRAWN AEROBIC AND ANAEROBIC Blood Culture results may not be optimal due to an excessive volume of blood received in culture bottles Performed at [facility], [address] [place], [place] [address]    Culture  Setup Time   Final    GRAM POSITIVE COCCI BONDURANT,R @ 0210 ON 02/10/19 BY JUW ANAEROBIC BOTTLE ONLY GS DONE @ [facility] CRITICAL RESULT  CALLED TO, READ BACK BY AND VERIFIED WITH: PHARMD STEVENS H. [id number] FCP Performed at [facility], [place] [address], [place], [place] [address]    Culture STREPTOCOCCUS INFANTARIUS (A)  Final   Report Status 02/12/2019 FINAL  Final   Organism ID, Bacteria STREPTOCOCCUS INFANTARIUS  Final      Susceptibility   Streptococcus infantarius - MIC*    PENICILLIN <=0.06 SENSITIVE Sensitive     CEFTRIAXONE <=0.12 SENSITIVE Sensitive      ERYTHROMYCIN <=0.12 SENSITIVE Sensitive     LEVOFLOXACIN 4 INTERMEDIATE Intermediate     VANCOMYCIN 0.5 SENSITIVE Sensitive     * STREPTOCOCCUS INFANTARIUS  Blood Culture ID Panel (Reflexed)     Status: Abnormal   Collection Time: 02/09/19  1:30 PM  Result Value Ref Range Status   Enterococcus species NOT DETECTED NOT DETECTED Final   Listeria monocytogenes NOT DETECTED NOT DETECTED Final   Staphylococcus species NOT DETECTED NOT DETECTED Final   Staphylococcus aureus (BCID) NOT DETECTED NOT DETECTED Final   Streptococcus species DETECTED (A) NOT DETECTED Final    Comment: Not Enterococcus species, Streptococcus agalactiae, Streptococcus pyogenes, or Streptococcus pneumoniae. CRITICAL RESULT CALLED TO, READ BACK BY AND VERIFIED WITH: PHARMD STEVENS H. [phone] [id number] FCP    Streptococcus agalactiae NOT DETECTED NOT DETECTED Final   Streptococcus pneumoniae NOT DETECTED NOT DETECTED Final   Streptococcus pyogenes NOT DETECTED NOT DETECTED Final   Acinetobacter baumannii NOT DETECTED NOT DETECTED Final   Enterobacteriaceae species NOT DETECTED NOT DETECTED Final   Enterobacter cloacae complex NOT DETECTED NOT DETECTED Final   Escherichia coli NOT DETECTED NOT DETECTED Final   Klebsiella oxytoca NOT DETECTED NOT DETECTED Final   Klebsiella pneumoniae NOT DETECTED NOT DETECTED Final   Proteus species NOT DETECTED NOT DETECTED Final   Serratia marcescens NOT DETECTED NOT DETECTED Final   Haemophilus influenzae NOT DETECTED NOT DETECTED Final   Neisseria meningitidis NOT DETECTED NOT DETECTED Final   Pseudomonas aeruginosa NOT DETECTED NOT DETECTED Final   Candida albicans NOT DETECTED NOT DETECTED Final   Candida glabrata NOT DETECTED NOT DETECTED Final   Candida krusei NOT DETECTED NOT DETECTED Final   Candida parapsilosis NOT DETECTED NOT DETECTED Final   Candida tropicalis NOT DETECTED NOT DETECTED Final    Comment: Performed at [facility], [place]. [address] [place], [place]  [address]  Blood Culture (routine x 2)     Status: Abnormal   Collection Time: 02/09/19  1:33 PM   Specimen: BLOOD RIGHT HAND  Result Value Ref Range Status   Specimen Description   Final    BLOOD RIGHT HAND Performed at [facility], [address], [place], [place] [address]    Special Requests   Final    BOTTLES DRAWN AEROBIC AND ANAEROBIC Blood Culture adequate volume Performed at [facility], [address] [place], [place] [address]    Culture  Setup Time   Final    GRAM POSITIVE COCCI Gram Stain Report Called to,Read Back By and Verified With: BONDURANT,R @ 0210 ON 02/10/19 BY JUW ANAEROBIC BOTTLE ONLY GS DONE @ [facility]    Culture (A)  Final    STREPTOCOCCUS INFANTARIUS SUSCEPTIBILITIES PERFORMED ON PREVIOUS CULTURE WITHIN THE LAST 5 DAYS. Performed at [facility], [place] [address] [place], [place] [address]    Report Status 02/12/2019 FINAL  Final  SARS Coronavirus 2 [facility] order, Performed in [facility] hospital lab) Nasopharyngeal Nasopharyngeal Swab     Status: None   Collection  Time: 02/09/19  2:00 PM   Specimen: Nasopharyngeal Swab  Result Value Ref Range Status   SARS Coronavirus 2 NEGATIVE NEGATIVE Final    Comment: (NOTE) If result is NEGATIVE SARS-CoV-2 target nucleic acids are NOT DETECTED. The SARS-CoV-2 RNA is generally detectable in upper and lower  respiratory specimens during the acute phase of infection. The lowest  concentration of SARS-CoV-2 viral copies this assay can detect is 250  copies / mL. A negative result does not preclude SARS-CoV-2 infection  and should not be used as the sole basis for treatment or other  patient management decisions.  A negative result may occur with  improper specimen collection / handling, submission of specimen other  than nasopharyngeal swab, presence of viral mutation(s) within the  areas targeted by this assay, and inadequate number of viral copies  (<250 copies / mL). A negative result must be combined with  clinical  observations, patient history, and epidemiological information. If result is POSITIVE SARS-CoV-2 target nucleic acids are DETECTED. The SARS-CoV-2 RNA is generally detectable in upper and lower  respiratory specimens dur ing the acute phase of infection.  Positive  results are indicative of active infection with SARS-CoV-2.  Clinical  correlation with patient history and other diagnostic information is  necessary to determine patient infection status.  Positive results do  not rule out bacterial infection or co-infection with other viruses. If result is PRESUMPTIVE POSTIVE SARS-CoV-2 nucleic acids MAY BE PRESENT.   A presumptive positive result was obtained on the submitted specimen  and confirmed on repeat testing.  While 2019 novel coronavirus  (SARS-CoV-2) nucleic acids may be present in the submitted sample  additional confirmatory testing may be necessary for epidemiological  and / or clinical management purposes  to differentiate between  SARS-CoV-2 and other Sarbecovirus currently known to infect humans.  If clinically indicated additional testing with an alternate test  methodology [phone]) is advised. The SARS-CoV-2 RNA is generally  detectable in upper and lower respiratory sp ecimens during the acute  phase of infection. The expected result is Negative. Fact Sheet for Patients:  StrictlyIdeas.no Fact Sheet for Healthcare Providers: BankingDealers.co.za This test is not yet approved or cleared by the [place] FDA and has been authorized for detection and/or diagnosis of SARS-CoV-2 by FDA under an Emergency Use Authorization (EUA).  This EUA will remain in effect (meaning this test can be used) for the duration of the COVID-19 declaration under Section 564(b)(1) of the Act, 21 U.S.C. section 360bbb-3(b)(1), unless the authorization is terminated or revoked sooner. Performed at [facility], [address]  [place], [place] [address]   Urine culture     Status: Abnormal   Collection Time: 02/09/19  3:45 PM   Specimen: In/Out Cath Urine  Result Value Ref Range Status   Specimen Description   Final    IN/OUT CATH URINE Performed at [facility], [address], [place], [place] [address]    Special Requests   Final    Normal Performed at [facility], [address] [place], [place] [address]    Culture 10,000 COLONIES/mL STAPHYLOCOCCUS LUGDUNENSIS (A)  Final   Report Status 02/11/2019 FINAL  Final   Organism ID, Bacteria STAPHYLOCOCCUS LUGDUNENSIS (A)  Final      Susceptibility   Staphylococcus lugdunensis - MIC*    CIPROFLOXACIN <=0.5 SENSITIVE Sensitive     GENTAMICIN <=0.5 SENSITIVE Sensitive     NITROFURANTOIN <=16 SENSITIVE Sensitive     OXACILLIN >=4 RESISTANT Resistant  TETRACYCLINE 2 SENSITIVE Sensitive     VANCOMYCIN <=0.5 SENSITIVE Sensitive     TRIMETH/SULFA <=10 SENSITIVE Sensitive     CLINDAMYCIN >=8 RESISTANT Resistant     RIFAMPIN <=0.5 SENSITIVE Sensitive     Inducible Clindamycin NEGATIVE Sensitive     * 10,000 COLONIES/mL STAPHYLOCOCCUS LUGDUNENSIS  Culture, blood (single)     Status: None   Collection Time: 02/10/19  5:27 AM   Specimen: Porta Cath; Blood  Result Value Ref Range Status   Specimen Description PORTA CATH BOTTLES DRAWN AEROBIC AND ANAEROBIC  Final   Special Requests Blood Culture adequate volume  Final   Culture   Final    NO GROWTH 5 DAYS Performed at [facility], [address], [place], [place] [address]    Report Status 02/15/2019 FINAL  Final  Culture, blood (Routine X 2) w Reflex to ID Panel     Status: None (Preliminary result)   Collection Time: 02/11/19  2:13 PM   Specimen: BLOOD RIGHT WRIST  Result Value Ref Range Status   Specimen Description BLOOD RIGHT WRIST  Final   Special Requests   Final    BOTTLES DRAWN AEROBIC AND ANAEROBIC Blood Culture results may not be optimal due to an excessive volume of blood received in culture bottles     Culture   Final    NO GROWTH 4 DAYS Performed at [facility], [address], [place], [place] [address]    Report Status PENDING  Incomplete  Culture, blood (Routine X 2) w Reflex to ID Panel     Status: None (Preliminary result)   Collection Time: 02/11/19  2:18 PM   Specimen: BLOOD LEFT ARM  Result Value Ref Range Status   Specimen Description BLOOD LEFT ARM  Final   Special Requests   Final    BOTTLES DRAWN AEROBIC AND ANAEROBIC Blood Culture results may not be optimal due to an excessive volume of blood received in culture bottles   Culture   Final    NO GROWTH 4 DAYS Performed at [facility], [address], [place], [place] [address]    Report Status PENDING  Incomplete  MRSA PCR Screening     Status: None   Collection Time: 02/12/19  7:38 PM   Specimen: Nasopharyngeal  Result Value Ref Range Status   MRSA by PCR NEGATIVE NEGATIVE Final    Comment:        The GeneXpert MRSA Assay (FDA approved for NASAL specimens only), is one component of a comprehensive MRSA colonization surveillance program. It is not intended to diagnose MRSA infection nor to guide or monitor treatment for MRSA infections. Performed at [facility], [address], [place], [place] [address]   Aspergillus Ag, BAL/Serum     Status: None   Collection Time: 02/13/19  6:41 AM  Result Value Ref Range Status   Aspergillus Ag, BAL/Serum 0.04 0.00 - 0.49 Index Final    Comment: (NOTE) Performed At: [facility] [address] [place], [place] [id number] Rush Farmer MD [id number] Performed At: [facility] [place], [place] [id number] Katina Degree MDPhD [id number]   Respiratory Panel by PCR     Status: None   Collection Time: 02/13/19  5:12 PM   Specimen: Nasopharyngeal Swab; Respiratory  Result Value Ref Range Status   Adenovirus NOT DETECTED NOT DETECTED Final   Coronavirus 229E NOT DETECTED NOT DETECTED Final    Comment: (NOTE) The Coronavirus on the  Respiratory Panel, DOES NOT test for the  novel  Coronavirus (2019 nCoV)    Coronavirus HKU1 NOT DETECTED NOT DETECTED Final   Coronavirus NL63 NOT DETECTED NOT DETECTED Final   Coronavirus OC43 NOT DETECTED NOT DETECTED Final   Metapneumovirus NOT DETECTED NOT DETECTED Final   Rhinovirus / Enterovirus NOT DETECTED NOT DETECTED Final   Influenza A NOT DETECTED NOT DETECTED Final   Influenza B NOT DETECTED NOT DETECTED Final   Parainfluenza Virus 1 NOT DETECTED NOT DETECTED Final   Parainfluenza Virus 2 NOT DETECTED NOT DETECTED Final   Parainfluenza Virus 3 NOT DETECTED NOT DETECTED Final   Parainfluenza Virus 4 NOT DETECTED NOT DETECTED Final   Respiratory Syncytial Virus NOT DETECTED NOT DETECTED Final   Bordetella pertussis NOT DETECTED NOT DETECTED Final   Chlamydophila pneumoniae NOT DETECTED NOT DETECTED Final   Mycoplasma pneumoniae NOT DETECTED NOT DETECTED Final    Comment: Performed at [facility], [place] [address], [place], [place] [address]  Cath Tip Culture     Status: None (Preliminary result)   Collection Time: 02/14/19 10:22 AM   Specimen: Catheter Tip  Result Value Ref Range Status   Specimen Description CATH TIP  Final   Special Requests CATH TIP RECEIVED IN COPAN SWAB  Final   Culture   Final    NO GROWTH 2 DAYS Performed at [facility], [place] [address] [place],  [address]    Report Status PENDING  Incomplete     Scheduled Meds:  sodium chloride   Intravenous Once   sodium chloride   Intravenous Once   sodium chloride   Intravenous Once   allopurinol  300 mg Oral QPM   docusate sodium  100 mg Oral QHS   loratadine  10 mg Oral Daily   magic mouthwash w/lidocaine  10 mL Oral QID   magnesium oxide  400 mg Oral QHS   Tbo-Filgrastim  480 mcg Subcutaneous Daily   Continuous Infusions:  sodium chloride     cefTRIAXone (ROCEPHIN)  IV 2 g (02/16/19 1320)    Procedures/Studies: Ct Chest W Contrast  Result Date:  02/12/2019 CLINICAL DATA:  Fever of unknown origin. Cough with loose stools. AML. EXAM: CT CHEST, ABDOMEN, AND PELVIS WITH CONTRAST TECHNIQUE: Multidetector CT imaging of the chest, abdomen and pelvis was performed following the standard protocol during bolus administration of intravenous contrast. CONTRAST:  168mL OMNIPAQUE IOHEXOL 300 MG/ML SOLN, 53mL OMNIPAQUE IOHEXOL 300 MG/ML SOLN COMPARISON:  CT urogram 09/29/2014. FINDINGS: CT CHEST FINDINGS Cardiovascular: The heart size is normal. No substantial pericardial effusion. No thoracic aortic aneurysm. Right Port-A-Cath tip is positioned in the mid SVC. Mediastinum/Nodes: No mediastinal lymphadenopathy. There is no hilar lymphadenopathy. The esophagus has normal imaging features. There is no axillary lymphadenopathy. Lungs/Pleura: Numerous irregular bilateral pulmonary nodules are identified. One of the more dominant nodules is seen in the left upper lobe on 35/3, measuring 1.4 cm diameter. Index nodule right upper lobe on 75/3 measures 8 mm. Left lower lobe index nodule on 94/3 is 10 mm. No focal airspace consolidation. No pleural effusion. Musculoskeletal: No worrisome lytic or sclerotic osseous abnormality. CT ABDOMEN PELVIS FINDINGS Hepatobiliary: The liver shows diffusely decreased attenuation suggesting fat deposition. No suspicious focal abnormality within the liver parenchyma. Gallbladder is nondistended. No intrahepatic or extrahepatic biliary dilation. Pancreas: No focal mass lesion. No dilatation of the main duct. No intraparenchymal cyst. No peripancreatic edema. Spleen: No splenomegaly. No focal mass lesion. Adrenals/Urinary Tract: No adrenal nodule or mass. Left kidney is markedly atrophic with 3.6 cm  interpolar cystic lesion similar to prior study. 5.7 cm cystic lesion anterior interpolar right kidney was 5.4 cm previously. 14 mm low-density lesion upper interpolar right kidney (69/2) was present previously and is not substantially changed. Central  sinus cysts are also noted in the right kidney. 8 mm nonobstructing stone identified lower pole right kidney. No evidence for hydroureter. Mild but asymmetric left bladder wall thickening evident (image 112/2. This was probably present on the previous study but less conspicuous. Stomach/Bowel: Stomach is unremarkable. No gastric wall thickening. No evidence of outlet obstruction. Duodenum is normally positioned as is the ligament of Treitz. Large duodenal diverticulum evident. No small bowel wall thickening. No small bowel dilatation. The terminal ileum is normal. The appendix is normal. No gross colonic mass. No colonic wall thickening. Diverticuli are seen scattered along the entire length of the colon without CT findings of diverticulitis. Vascular/Lymphatic: There is abdominal aortic atherosclerosis without aneurysm. There is no gastrohepatic or hepatoduodenal ligament lymphadenopathy. No intraperitoneal or retroperitoneal lymphadenopathy. No pelvic sidewall lymphadenopathy. Reproductive: Prostate gland is enlarged. Other: No intraperitoneal free fluid. Musculoskeletal: No worrisome lytic or sclerotic osseous abnormality. Status post lumbar fusion. IMPRESSION: 1. Numerous bilateral irregular/spiculated pulmonary nodules measuring up to 14 mm. Imaging features are nonspecific and could be related to an infectious etiology or neoplasm. 2. Mild asymmetric left bladder wall thickening, likely present on the study from over 4 years ago although less conspicuous on that exam due to lack of intravenous contrast administration. 3. Hepatic steatosis. 4. Bilateral renal cysts with 8 mm nonobstructing right renal stone. 5.  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   On: 02/12/2019 20:57   Ct Abdomen Pelvis W Contrast  Result Date: 02/12/2019 CLINICAL DATA:  Fever of unknown origin. Cough with loose stools. AML. EXAM: CT CHEST, ABDOMEN, AND PELVIS WITH CONTRAST TECHNIQUE:  Multidetector CT imaging of the chest, abdomen and pelvis was performed following the standard protocol during bolus administration of intravenous contrast. CONTRAST:  177mL OMNIPAQUE IOHEXOL 300 MG/ML SOLN, 27mL OMNIPAQUE IOHEXOL 300 MG/ML SOLN COMPARISON:  CT urogram 09/29/2014. FINDINGS: CT CHEST FINDINGS Cardiovascular: The heart size is normal. No substantial pericardial effusion. No thoracic aortic aneurysm. Right Port-A-Cath tip is positioned in the mid SVC. Mediastinum/Nodes: No mediastinal lymphadenopathy. There is no hilar lymphadenopathy. The esophagus has normal imaging features. There is no axillary lymphadenopathy. Lungs/Pleura: Numerous irregular bilateral pulmonary nodules are identified. One of the more dominant nodules is seen in the left upper lobe on 35/3, measuring 1.4 cm diameter. Index nodule right upper lobe on 75/3 measures 8 mm. Left lower lobe index nodule on 94/3 is 10 mm. No focal airspace consolidation. No pleural effusion. Musculoskeletal: No worrisome lytic or sclerotic osseous abnormality. CT ABDOMEN PELVIS FINDINGS Hepatobiliary: The liver shows diffusely decreased attenuation suggesting fat deposition. No suspicious focal abnormality within the liver parenchyma. Gallbladder is nondistended. No intrahepatic or extrahepatic biliary dilation. Pancreas: No focal mass lesion. No dilatation of the main duct. No intraparenchymal cyst. No peripancreatic edema. Spleen: No splenomegaly. No focal mass lesion. Adrenals/Urinary Tract: No adrenal nodule or mass. Left kidney is markedly atrophic with 3.6 cm interpolar cystic lesion similar to prior study. 5.7 cm cystic lesion anterior interpolar right kidney was 5.4 cm previously. 14 mm low-density lesion upper interpolar right kidney (69/2) was present previously and is not substantially changed. 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 8 mm nonobstructing stone identified lower pole right kidney. No evidence for hydroureter. Mild  but  asymmetric left bladder wall thickening evident (image 112/2. This was probably present on the previous study but less conspicuous. Stomach/Bowel: Stomach is unremarkable. No gastric wall thickening. No evidence of outlet obstruction. Duodenum is normally positioned as is the ligament of Treitz. Large duodenal diverticulum evident. No small bowel wall thickening. No small bowel dilatation. The terminal ileum is normal. The appendix is normal. No gross colonic mass. No colonic wall thickening. Diverticuli are seen scattered along the entire length of the colon without CT findings of diverticulitis. Vascular/Lymphatic: There is abdominal aortic atherosclerosis without aneurysm. There is no gastrohepatic or hepatoduodenal ligament lymphadenopathy. No intraperitoneal or retroperitoneal lymphadenopathy. No pelvic sidewall lymphadenopathy. Reproductive: Prostate gland is enlarged. Other: No intraperitoneal free fluid. Musculoskeletal: No worrisome lytic or sclerotic osseous abnormality. Status post lumbar fusion. IMPRESSION: 1. Numerous bilateral irregular/spiculated pulmonary nodules measuring up to 14 mm. Imaging features are nonspecific and could be related to an infectious etiology or neoplasm. 2. Mild asymmetric left bladder wall thickening, likely present on the study from over 4 years ago although less conspicuous on that exam due to lack of intravenous contrast administration. 3. Hepatic steatosis. 4. Bilateral renal cysts with 8 mm nonobstructing right renal stone. 5.  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   On: 02/12/2019 20:57   Dg Chest Port 1 View  Result Date: 02/09/2019 CLINICAL DATA:  Fever and cough. Patient has leukemia receiving chemotherapy. EXAM: PORTABLE CHEST 1 VIEW COMPARISON:  08/28/2018 FINDINGS: Right subclavian Port-A-Cath unchanged. Lungs are adequately inflated without focal airspace consolidation or effusion. Cardiomediastinal silhouette  and remainder the exam is unchanged. IMPRESSION: No active disease. Electronically Signed   By: Marin Olp M.D.   On: 02/09/2019 14:26    Orson Eva, DO  [facility] Pager [phone]  If 7PM-7AM, please contact night-coverage [email or web address] Password [email or web address] 02/16/2019, 4:08 PM   LOS: 7 days

## 2019-02-16 NOTE — Progress Notes (Signed)
Unsuccessful Attempted IV x 1.

## 2019-02-17 ENCOUNTER — Other Ambulatory Visit (HOSPITAL_COMMUNITY): Payer: Medicare Other

## 2019-02-17 ENCOUNTER — Ambulatory Visit (HOSPITAL_COMMUNITY): Payer: Medicare Other | Admitting: Hematology

## 2019-02-17 ENCOUNTER — Ambulatory Visit (HOSPITAL_COMMUNITY): Payer: Medicare Other

## 2019-02-17 ENCOUNTER — Encounter (HOSPITAL_COMMUNITY): Payer: Self-pay | Admitting: *Deleted

## 2019-02-17 LAB — CBC WITH DIFFERENTIAL/PLATELET
Abs Immature Granulocytes: 0.28 10*3/uL — ABNORMAL HIGH (ref 0.00–0.07)
Basophils Absolute: 0.1 10*3/uL (ref 0.0–0.1)
Basophils Relative: 1 %
Eosinophils Absolute: 0 10*3/uL (ref 0.0–0.5)
Eosinophils Relative: 0 %
HCT: 32.6 % — ABNORMAL LOW (ref 39.0–52.0)
Hemoglobin: 9.7 g/dL — ABNORMAL LOW (ref 13.0–17.0)
Immature Granulocytes: 7 %
Lymphocytes Relative: 27 %
Lymphs Abs: 1.1 10*3/uL (ref 0.7–4.0)
MCH: 30.2 pg (ref 26.0–34.0)
MCHC: 29.8 g/dL — ABNORMAL LOW (ref 30.0–36.0)
MCV: 101.6 fL — ABNORMAL HIGH (ref 80.0–100.0)
Monocytes Absolute: 0.6 10*3/uL (ref 0.1–1.0)
Monocytes Relative: 14 %
Neutro Abs: 2 10*3/uL (ref 1.7–7.7)
Neutrophils Relative %: 51 %
Platelets: 63 10*3/uL — ABNORMAL LOW (ref 150–400)
RBC: 3.21 MIL/uL — ABNORMAL LOW (ref 4.22–5.81)
RDW: 17.9 % — ABNORMAL HIGH (ref 11.5–15.5)
WBC: 4 10*3/uL (ref 4.0–10.5)
nRBC: 0.8 % — ABNORMAL HIGH (ref 0.0–0.2)

## 2019-02-17 LAB — CULTURE, BLOOD (ROUTINE X 2)
Culture: NO GROWTH
Culture: NO GROWTH

## 2019-02-17 LAB — BASIC METABOLIC PANEL
Anion gap: 11 (ref 5–15)
BUN: 22 mg/dL (ref 8–23)
CO2: 26 mmol/L (ref 22–32)
Calcium: 9.4 mg/dL (ref 8.9–10.3)
Chloride: 103 mmol/L (ref 98–111)
Creatinine, Ser: 1.1 mg/dL (ref 0.61–1.24)
GFR calc Af Amer: >60 mL/min (ref >60)
GFR calc non Af Amer: >60 mL/min (ref >60)
Glucose, Bld: 95 mg/dL (ref 70–99)
Potassium: 4.6 mmol/L (ref 3.5–5.1)
Sodium: 140 mmol/L (ref 135–145)

## 2019-02-17 LAB — MAGNESIUM: Magnesium: 2.1 mg/dL (ref 1.7–2.4)

## 2019-02-17 NOTE — Care Management Important Message (Signed)
Important Message  Patient Details  Name: Richard Wallace MRN: [id number] Date of Birth: 09-09-1945   Medicare Important Message Given:  Yes(given to RN to deliver to patient due to contact precautions)     Tommy Medal 02/17/2019, 3:21 PM

## 2019-02-17 NOTE — Consult Note (Signed)
[facility] Oncology Progress Note  Name: Richard Wallace      MRN: [id number]    Location: R416/L845-36  Date: 02/17/2019 Time:6:32 PM   Subjective: Interval History:Richard Wallace is seen for follow-up of neutropenic fever.  He had Port-A-Cath removed on 02/14/2019.  He has been afebrile today.  He also feels better in terms of energy.  Cough has improved slightly.  He is able to eat well.  Denies any bleeding per rectum.  He is continuing to receive ceftriaxone.  No nausea, vomiting or diarrhea reported.  Objective: Vital signs in last 24 hours: Temp:  [97.7 F (36.5 C)-99.3 F (37.4 C)] 97.7 F (36.5 C) (10/12 1413) Pulse Rate:  [80-94] 94 (10/12 1413) Resp:  [16-20] 18 (10/12 1413) BP: (115-138)/(49-83) 132/83 (10/12 1413) SpO2:  [96 %-97 %] 97 % (10/12 1413)    Intake/Output from previous day: 10/11 0800 - 10/12 0759 In: 480 [P.O.:480] Out: 200 [Urine:200]    Intake/Output this shift: Total I/O In: 240 [P.O.:240] Out: -    PHYSICAL EXAM: BP 132/83 (BP Location: Left Arm)   Pulse 94   Temp 97.7 F (36.5 C) (Oral)   Resp 18   Ht 5' 9.5" (1.765 m)   Wt 225 lb (102.1 kg)   SpO2 97%   BMI 32.75 kg/m  General appearance: alert, cooperative and appears stated age Lungs: clear to auscultation bilaterally Heart: regular rate and rhythm Abdomen: soft, non-tender; bowel sounds normal; no masses,  no organomegaly Extremities: 1+ edema bilaterally. Skin: Skin color, texture, turgor normal. No rashes or lesions Lymph nodes: Cervical, supraclavicular, and axillary nodes normal. Neurologic: Grossly normal   Studies/Results: Results for orders placed or performed during the hospital encounter of 02/09/19 (from the past 48 hour(s))  CBC     Status: Abnormal   Collection Time: 02/16/19  4:14 PM  Result Value Ref Range   WBC 3.8 (L) 4.0 - 10.5 K/uL   RBC 3.12 (L) 4.22 - 5.81 MIL/uL   Hemoglobin 9.3 (L) 13.0 - 17.0 g/dL    Comment: REPEATED TO VERIFY POST TRANSFUSION  SPECIMEN    HCT 31.2 (L) 39.0 - 52.0 %   MCV 100.0 80.0 - 100.0 fL   MCH 29.8 26.0 - 34.0 pg   MCHC 29.8 (L) 30.0 - 36.0 g/dL   RDW 18.5 (H) 11.5 - 15.5 %   Platelets 58 (L) 150 - 400 K/uL    Comment: REPEATED TO VERIFY SPECIMEN CHECKED FOR CLOTS Immature Platelet Fraction may be clinically indicated, consider ordering this additional test [id number] CONSISTENT WITH PREVIOUS RESULT    nRBC 1.3 (H) 0.0 - 0.2 %    Comment: Performed at [facility], [address], [place], [place] [address]  CBC with Differential/Platelet     Status: Abnormal   Collection Time: 02/17/19  4:49 AM  Result Value Ref Range   WBC 4.0 4.0 - 10.5 K/uL   RBC 3.21 (L) 4.22 - 5.81 MIL/uL   Hemoglobin 9.7 (L) 13.0 - 17.0 g/dL   HCT 32.6 (L) 39.0 - 52.0 %   MCV 101.6 (H) 80.0 - 100.0 fL   MCH 30.2 26.0 - 34.0 pg   MCHC 29.8 (L) 30.0 - 36.0 g/dL   RDW 17.9 (H) 11.5 - 15.5 %   Platelets 63 (L) 150 - 400 K/uL    Comment: PLATELET COUNT CONFIRMED BY SMEAR SPECIMEN CHECKED FOR CLOTS    nRBC 0.8 (H) 0.0 - 0.2 %   Neutrophils Relative % 51 %   Neutro  Abs 2.0 1.7 - 7.7 K/uL   Lymphocytes Relative 27 %   Lymphs Abs 1.1 0.7 - 4.0 K/uL   Monocytes Relative 14 %   Monocytes Absolute 0.6 0.1 - 1.0 K/uL   Eosinophils Relative 0 %   Eosinophils Absolute 0.0 0.0 - 0.5 K/uL   Basophils Relative 1 %   Basophils Absolute 0.1 0.0 - 0.1 K/uL   Immature Granulocytes 7 %   Abs Immature Granulocytes 0.28 (H) 0.00 - 0.07 K/uL    Comment: Performed at [facility], [address] [place], [place] [address]  Basic metabolic panel     Status: None   Collection Time: 02/17/19  4:49 AM  Result Value Ref Range   Sodium 140 135 - 145 mmol/L   Potassium 4.6 3.5 - 5.1 mmol/L   Chloride 103 98 - 111 mmol/L   CO2 26 22 - 32 mmol/L   Glucose, Bld 95 70 - 99 mg/dL   BUN 22 8 - 23 mg/dL   Creatinine, Ser 1.10 0.61 - 1.24 mg/dL   Calcium 9.4 8.9 - 10.3 mg/dL   GFR calc non Af Amer >60 >60 mL/min   GFR calc Af Amer >60 >60 mL/min    Anion gap 11 5 - 15    Comment: Performed at [facility], [address], [place], [place] [address]  Magnesium     Status: None   Collection Time: 02/17/19  4:49 AM  Result Value Ref Range   Magnesium 2.1 1.7 - 2.4 mg/dL    Comment: Performed at [facility], [address] [place], [place] [address]   No results found.   MEDICATIONS: I have reviewed the patient's current medications.     Assessment/Plan:  1.  Neutropenic fever: - Blood culture from 02/09/2019 showed Streptococcus Infantarius. -He is receiving ceftriaxone. -Port-A-Cath was removed on 02/14/2019. - CT CAP on 02/12/2019 showed numerous bilateral irregular pulmonary nodules measuring 14 mm.  Abdomen and pelvis was negative. - He has been afebrile in the last 24 hours.  White count also improved to 4000.  Platelet count has gone up to 65. - He will have a PICC placed and will be discharged home on ceftriaxone. -Galactomannan was negative.  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 -Venetoclax held on 01/26/2019 as his platelet count dropped to 9. - We will not start cycle 2 until he finishes antibiotics. -I plan to repeat a bone marrow biopsy to evaluate response.  3.  Mucositis: -He is using Magic mouthwash prior to eating which is helping.  4.  Leukopenia and thrombocytopenia: -Platelet count today improved to 63.  Hemoglobin is 9.7.  White count is 4 with ANC of 2000. - Neupogen may be discontinued at this time.  All questions were answered. The patient knows to call the clinic with any problems, questions or concerns. We can certainly see the patient much sooner if necessary.    [name]

## 2019-02-17 NOTE — Discharge Summary (Addendum)
Physician Discharge Summary  Richard Wallace KTG:[id number] DOB: March 16, 1946 DOA: 02/09/2019  PCP: [name], MD  Admit date: 02/09/2019 Discharge date: 02/18/2019  Admitted From:Home Disposition:  Home  Recommendations for Outpatient Follow-up:  1. Follow up with PCP in 1-2 weeks 2. Please obtain BMP/CBC in one week 3. Please follow up on the following pending results:  Home Health: YES Equipment/Devices: HHRN  Discharge Condition: Stable CODE STATUS: FULL Diet recommendation:  Regular   Brief/Interim Summary: 73 y.o.malewith medical history significant ofactive chemotherapy for AML, last tx 2 weeks ago, CKD, GERD, HTN, atrophy of kidney and dc from here 5 days ago where he was admitted for rectal bleeding and severe thrombocytopenia (9000) nowpresenting with fever to 104 since last nightw/o sweats but with rigors, mild nonproductive cough (denies sob) and sore throat. He endorses nausea but this is a chronic finding, not new.He denies diarrhea but has had some loose stools. Denies mucus or blood in the stool currently..Denies chest pain, headache, abdominal pain, dysuria. He denies unusual bruising or petechiae. No further rectal bleeding since the day of dc from here 9/29. He reports feeling profoundly weak without dizziness, no focal weakness. The patient was admitted for treatment of neutropenic fever. Blood culture showed Streptococcus infantarius. Because of persistent fevers, general surgery was consulted for Port-A-Cath removal. Antibiotics were narrowed to ceftriaxone.  His Port-A-Cath was removed on 02/14/19.  Fortunately, his blood WBC and platelet counts began to recover.  His granix was discontinued and his WBC count remained stable.  During the hospitalization, he received 9 units of platelets and 3 units PRBCs.  Repeat blood cultures remained negative.  d/c home with ceftriaxone 2 grams daily x 9 more days which will be 2 weeks beyond port-a-cath  removal  Discharge Diagnoses:  neutropenic fever -Blood cultures positive for Streptococcus species bacteremia -pt continues to remain neutropenic and febrile although fever curve is decreasing -10/12--discontinue granix--last dose 02/15/19 -consult surgery to remove port-a-cathdue to persistent fever -CT chest/abd/pelvis--no lymphadenopathy. Numerous bilateral pulmonary nodules; no consolidation, no pleural effusion; no hydronephrosis; no hydroureter; diverticulosis; no retroperitoneal lymphadenopathy -case discussed withID, Dr. Comer--->proceed in stepwise fashion-->remove port-a-cath out first and if fever persists, then pursue treatment/diagnosis of pulm nodules -02/14/19--port-a-cath--removed -checkedfungal antibodies, serum beta-glucan  Bacteremia with sepsis -strep infantarius -with persistent fever, consultedsurgery to remove port-a-cath -02/14/19--port-a-cath--surgically excised-->culture neg -case discussed with Dr. Aviva Signs -place PICC line now that repeat blood cultures are neg -d/c home with ceftriaxone 2 grams daily x 9 more days which will be 2 weeks beyond port-a-cath removal (last dose on 02/27/19)  leukemia/MDS -appreciate med/onc follow up -case discussed with Dr. Delton Coombes -Patient actively receiving chemotherapyas an outpatient. -Cycle 1 of decitabine and venetoclax on 01/20/2019. -Venetoclax held on 02/03/2019 as his platelet count was low at 9000. -Holding chemotherapeutic agents at this time.  pancytopenia -Patient received 3 units of plateletson admission,as his platelet levelwas7000. 10/8--transfuse 2 more units platelets(7 total for admission) -02/14/19--transfuse 2 more units platelets after surgery (9 units total) -having intermittent rectal bleeding. -transfused1 unit of PRBCs 02/11/19 -02/15/19--transfuse 2 units PRBC (3 units total for the admission)  essential hypertension -Currently not taking any medication as an  outpatient -monitor vital signs and initiate antihypertensive management if needed.  Staphylococcus lugdunensisbacteruria -Patient denies dysuria -No treatment will be provided for this isolated microorganism currently as per ID recommendations.  Mucositis  -started magic mouthwash with lidocaine  Lung Nodules -incidental findings on CT chestbut may represent opportunistic infection if fever persists    Discharge Instructions  Discharge Instructions    Home infusion instructions [facility] May follow [place] Dosing Protocol; May administer Cathflo as needed to maintain patency of vascular access device.; Flushing of vascular access device: per [facility] Protocol: 0.9% NaCl pre/post medica...   Complete by: As directed    Instructions: May follow [place] Dosing Protocol   Instructions: May administer Cathflo as needed to maintain patency of vascular access device.   Instructions: Flushing of vascular access device: per [facility] Protocol: 0.9% NaCl pre/post medication administration and prn patency; Heparin 100 u/ml, 61m for implanted ports and Heparin 10u/ml, 559mfor all other central venous catheters.   Instructions: May follow [facility] Anaphylaxis Protocol for First Dose Administration in the home: 0.9% NaCl at 25-50 ml/hr to maintain IV access for protocol meds. Epinephrine 0.3 ml IV/IM PRN and Benadryl 25-50 IV/IM PRN s/s of anaphylaxis.   Instructions: [facility] (RN) to assist per patient IV care needs in the home PRN.     Allergies as of 02/18/2019   No Known Allergies     Medication List    TAKE these medications   acetaminophen 500 MG tablet Commonly known as: TYLENOL Take 1,000 mg by mouth every 6 (six) hours as needed for mild pain or moderate pain.   allopurinol 300 MG tablet Commonly known as: ZYLOPRIM Take 300 mg by mouth every evening.   cefTRIAXone  IVPB Commonly known as: ROCEPHIN Inject 2 g into the vein daily for 9  doses. Indication:  fever Last Day of Therapy:  02-27-2019 Labs - Once weekly:  CBC/D and BMP, Labs - Every other week:  ESR and CRP   cetirizine 10 MG tablet Commonly known as: ZYRTEC Take 10 mg by mouth daily. IN THE MORNING   diclofenac sodium 1 % Gel Commonly known as: VOLTAREN Apply 1 g topically 3 (three) times daily as needed (knee pain.).   docusate sodium 100 MG capsule Commonly known as: COLACE Take 100 mg by mouth at bedtime.   magic mouthwash w/lidocaine Soln Take 15 mLs by mouth 4 (four) times daily as needed for mouth pain (Sore throat/dysphagia.).   magnesium oxide 400 MG tablet Commonly known as: MAG-OX Take 400 mg by mouth at bedtime.   traMADol 50 MG tablet Commonly known as: ULTRAM TAKE 1 TABLET BY MOUTH THREE TIMES DAILY AS NEEDED FOR PAIN            Home Infusion Instuctions  (From admission, onward)         Start     Ordered   02/18/19 0000  Home infusion instructions [facility] May follow [facility] Protocol; May administer Cathflo as needed to maintain patency of vascular access device.; Flushing of vascular access device: [facility]: 0.9% NaCl pre/post medica...    Question Answer Comment  Instructions May follow [facility] Protocol   Instructions May administer Cathflo as needed to maintain patency of vascular access device.   Instructions Flushing of vascular access device: per [facility]: 0.9% NaCl pre/post medication administration and prn patency; Heparin 100 u/ml, 3m20mor implanted ports and Heparin 10u/ml, 3ml43mr all other central venous catheters.   Instructions May follow [facility] Anaphylaxis Protocol for First Dose Administration in the home: 0.9% NaCl at 25-50 ml/hr to maintain IV access for protocol meds. Epinephrine 0.3 ml IV/IM PRN and Benadryl 25-50 IV/IM PRN s/s of anaphylaxis.   Instructions [facility] Infusion Coordinator (RN) to assist per patient IV care needs in  the home PRN.       02/18/19 1315         Follow-up Information    [facility] Follow up.   Why:   For IV Antibiotic Contact information: [place] Follow up.   Why: Home Health RN/ PT Contact information: [phone]         No Known Allergies  Consultations:  Med onc  General surgery   Procedures/Studies: Ct Chest W Contrast  Result Date: 02/12/2019 CLINICAL DATA:  Fever of unknown origin. Cough with loose stools. AML. EXAM: CT CHEST, ABDOMEN, AND PELVIS WITH CONTRAST TECHNIQUE: Multidetector CT imaging of the chest, abdomen and pelvis was performed following the standard protocol during bolus administration of intravenous contrast. CONTRAST:  129m OMNIPAQUE IOHEXOL 300 MG/ML SOLN, 339mOMNIPAQUE IOHEXOL 300 MG/ML SOLN COMPARISON:  CT urogram 09/29/2014. FINDINGS: CT CHEST FINDINGS Cardiovascular: The heart size is normal. No substantial pericardial effusion. No thoracic aortic aneurysm. Right Port-A-Cath tip is positioned in the mid SVC. Mediastinum/Nodes: No mediastinal lymphadenopathy. There is no hilar lymphadenopathy. The esophagus has normal imaging features. There is no axillary lymphadenopathy. Lungs/Pleura: Numerous irregular bilateral pulmonary nodules are identified. One of the more dominant nodules is seen in the left upper lobe on 35/3, measuring 1.4 cm diameter. Index nodule right upper lobe on 75/3 measures 8 mm. Left lower lobe index nodule on 94/3 is 10 mm. No focal airspace consolidation. No pleural effusion. Musculoskeletal: No worrisome lytic or sclerotic osseous abnormality. CT ABDOMEN PELVIS FINDINGS Hepatobiliary: The liver shows diffusely decreased attenuation suggesting fat deposition. No suspicious focal abnormality within the liver parenchyma. Gallbladder is nondistended. No intrahepatic or extrahepatic biliary dilation. Pancreas: No focal mass lesion. No dilatation of the main duct. No intraparenchymal cyst. No peripancreatic  edema. Spleen: No splenomegaly. No focal mass lesion. Adrenals/Urinary Tract: No adrenal nodule or mass. Left kidney is markedly atrophic with 3.6 cm interpolar cystic lesion similar to prior study. 5.7 cm cystic lesion anterior interpolar right kidney was 5.4 cm previously. 14 mm low-density lesion upper interpolar right kidney (69/2) was present previously and is not substantially changed. 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 8 mm nonobstructing stone identified lower pole right kidney. No evidence for hydroureter. Mild but asymmetric left bladder wall thickening evident (image 112/2. This was probably present on the previous study but less conspicuous. Stomach/Bowel: Stomach is unremarkable. No gastric wall thickening. No evidence of outlet obstruction. Duodenum is normally positioned as is the ligament of Treitz. Large duodenal diverticulum evident. No small bowel wall thickening. No small bowel dilatation. The terminal ileum is normal. The appendix is normal. No gross colonic mass. No colonic wall thickening. Diverticuli are seen scattered along the entire length of the colon without CT findings of diverticulitis. Vascular/Lymphatic: There is abdominal aortic atherosclerosis without aneurysm. There is no gastrohepatic or hepatoduodenal ligament lymphadenopathy. No intraperitoneal or retroperitoneal lymphadenopathy. No pelvic sidewall lymphadenopathy. Reproductive: Prostate gland is enlarged. Other: No intraperitoneal free fluid. Musculoskeletal: No worrisome lytic or sclerotic osseous abnormality. Status post lumbar fusion. IMPRESSION: 1. Numerous bilateral irregular/spiculated pulmonary nodules measuring up to 14 mm. Imaging features are nonspecific and could be related to an infectious etiology or neoplasm. 2. Mild asymmetric left bladder wall thickening, likely present on the study from over 4 years ago although less conspicuous on that exam due to lack of intravenous contrast  administration. 3. Hepatic steatosis. 4. Bilateral renal cysts with 8 mm nonobstructing right renal stone. 5.  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   On: 02/12/2019 20:57   Ct Abdomen Pelvis W Contrast  Result Date: 02/12/2019 CLINICAL DATA:  Fever of unknown origin. Cough with loose stools. AML. EXAM: CT CHEST, ABDOMEN, AND PELVIS WITH CONTRAST TECHNIQUE: Multidetector CT imaging of the chest, abdomen and pelvis was performed following the standard protocol during bolus administration of intravenous contrast. CONTRAST:  132m OMNIPAQUE IOHEXOL 300 MG/ML SOLN, 312mOMNIPAQUE IOHEXOL 300 MG/ML SOLN COMPARISON:  CT urogram 09/29/2014. FINDINGS: CT CHEST FINDINGS Cardiovascular: The heart size is normal. No substantial pericardial effusion. No thoracic aortic aneurysm. Right Port-A-Cath tip is positioned in the mid SVC. Mediastinum/Nodes: No mediastinal lymphadenopathy. There is no hilar lymphadenopathy. The esophagus has normal imaging features. There is no axillary lymphadenopathy. Lungs/Pleura: Numerous irregular bilateral pulmonary nodules are identified. One of the more dominant nodules is seen in the left upper lobe on 35/3, measuring 1.4 cm diameter. Index nodule right upper lobe on 75/3 measures 8 mm. Left lower lobe index nodule on 94/3 is 10 mm. No focal airspace consolidation. No pleural effusion. Musculoskeletal: No worrisome lytic or sclerotic osseous abnormality. CT ABDOMEN PELVIS FINDINGS Hepatobiliary: The liver shows diffusely decreased attenuation suggesting fat deposition. No suspicious focal abnormality within the liver parenchyma. Gallbladder is nondistended. No intrahepatic or extrahepatic biliary dilation. Pancreas: No focal mass lesion. No dilatation of the main duct. No intraparenchymal cyst. No peripancreatic edema. Spleen: No splenomegaly. No focal mass lesion. Adrenals/Urinary Tract: No adrenal nodule or mass. Left kidney is markedly atrophic  with 3.6 cm interpolar cystic lesion similar to prior study. 5.7 cm cystic lesion anterior interpolar right kidney was 5.4 cm previously. 14 mm low-density lesion upper interpolar right kidney (69/2) was present previously and is not substantially changed. 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 8 mm nonobstructing stone identified lower pole right kidney. No evidence for hydroureter. Mild but asymmetric left bladder wall thickening evident (image 112/2. This was probably present on the previous study but less conspicuous. Stomach/Bowel: Stomach is unremarkable. No gastric wall thickening. No evidence of outlet obstruction. Duodenum is normally positioned as is the ligament of Treitz. Large duodenal diverticulum evident. No small bowel wall thickening. No small bowel dilatation. The terminal ileum is normal. The appendix is normal. No gross colonic mass. No colonic wall thickening. Diverticuli are seen scattered along the entire length of the colon without CT findings of diverticulitis. Vascular/Lymphatic: There is abdominal aortic atherosclerosis without aneurysm. There is no gastrohepatic or hepatoduodenal ligament lymphadenopathy. No intraperitoneal or retroperitoneal lymphadenopathy. No pelvic sidewall lymphadenopathy. Reproductive: Prostate gland is enlarged. Other: No intraperitoneal free fluid. Musculoskeletal: No worrisome lytic or sclerotic osseous abnormality. Status post lumbar fusion. IMPRESSION: 1. Numerous bilateral irregular/spiculated pulmonary nodules measuring up to 14 mm. Imaging features are nonspecific and could be related to an infectious etiology or neoplasm. 2. Mild asymmetric left bladder wall thickening, likely present on the study from over 4 years ago although less conspicuous on that exam due to lack of intravenous contrast administration. 3. Hepatic steatosis. 4. Bilateral renal cysts with 8 mm nonobstructing right renal stone. 5.  Aortic Atherosclerois (ICD10-170.0)  Electronically Signed   By: ErMisty Stanley.D.   On: 02/12/2019 20:57   Dg Chest Port 1 View  Result Date: 02/09/2019 CLINICAL DATA:  Fever and cough. Patient has leukemia receiving chemotherapy. EXAM: PORTABLE CHEST 1 VIEW COMPARISON:  08/28/2018 FINDINGS: Right subclavian Port-A-Cath unchanged. Lungs are adequately inflated without focal airspace consolidation or effusion.  Cardiomediastinal silhouette and remainder the exam is unchanged. IMPRESSION: No active disease. Electronically Signed   By: Marin Olp M.D.   On: 02/09/2019 14:26   [place] Ekg Site Rite  Result Date: 02/18/2019 If Site Rite image not attached, placement could not be confirmed due to current cardiac rhythm.       Discharge Exam: Vitals:   02/17/19 2058 02/18/19 0539  BP: 135/82 135/79  Pulse: 88 95  Resp: 16 20  Temp: 99.9 F (37.7 C) 98.6 F (37 C)  SpO2: 97% 94%   Vitals:   02/17/19 1413 02/17/19 1958 02/17/19 2058 02/18/19 0539  BP: 132/83  135/82 135/79  Pulse: 94  88 95  Resp: '18  16 20  ' Temp: 97.7 F (36.5 C)  99.9 F (37.7 C) 98.6 F (37 C)  TempSrc: Oral  Oral Oral  SpO2: 97% 92% 97% 94%  Weight:      Height:        General: Pt is alert, awake, not in acute distress Cardiovascular: RRR, S1/S2 +, no rubs, no gallops Respiratory: bibasilar crackles. No wheeze Abdominal: Soft, NT, ND, bowel sounds + Extremities: no edema, no cyanosis   The results of significant diagnostics from this hospitalization (including imaging, microbiology, ancillary and laboratory) are listed below for reference.    Significant Diagnostic Studies: Ct Chest W Contrast  Result Date: 02/12/2019 CLINICAL DATA:  Fever of unknown origin. Cough with loose stools. AML. EXAM: CT CHEST, ABDOMEN, AND PELVIS WITH CONTRAST TECHNIQUE: Multidetector CT imaging of the chest, abdomen and pelvis was performed following the standard protocol during bolus administration of intravenous contrast. CONTRAST:  188m OMNIPAQUE IOHEXOL  300 MG/ML SOLN, 384mOMNIPAQUE IOHEXOL 300 MG/ML SOLN COMPARISON:  CT urogram 09/29/2014. FINDINGS: CT CHEST FINDINGS Cardiovascular: The heart size is normal. No substantial pericardial effusion. No thoracic aortic aneurysm. Right Port-A-Cath tip is positioned in the mid SVC. Mediastinum/Nodes: No mediastinal lymphadenopathy. There is no hilar lymphadenopathy. The esophagus has normal imaging features. There is no axillary lymphadenopathy. Lungs/Pleura: Numerous irregular bilateral pulmonary nodules are identified. One of the more dominant nodules is seen in the left upper lobe on 35/3, measuring 1.4 cm diameter. Index nodule right upper lobe on 75/3 measures 8 mm. Left lower lobe index nodule on 94/3 is 10 mm. No focal airspace consolidation. No pleural effusion. Musculoskeletal: No worrisome lytic or sclerotic osseous abnormality. CT ABDOMEN PELVIS FINDINGS Hepatobiliary: The liver shows diffusely decreased attenuation suggesting fat deposition. No suspicious focal abnormality within the liver parenchyma. Gallbladder is nondistended. No intrahepatic or extrahepatic biliary dilation. Pancreas: No focal mass lesion. No dilatation of the main duct. No intraparenchymal cyst. No peripancreatic edema. Spleen: No splenomegaly. No focal mass lesion. Adrenals/Urinary Tract: No adrenal nodule or mass. Left kidney is markedly atrophic with 3.6 cm interpolar cystic lesion similar to prior study. 5.7 cm cystic lesion anterior interpolar right kidney was 5.4 cm previously. 14 mm low-density lesion upper interpolar right kidney (69/2) was present previously and is not substantially changed. 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 8 mm nonobstructing stone identified lower pole right kidney. No evidence for hydroureter. Mild but asymmetric left bladder wall thickening evident (image 112/2. This was probably present on the previous study but less conspicuous. Stomach/Bowel: Stomach is unremarkable. No gastric wall  thickening. No evidence of outlet obstruction. Duodenum is normally positioned as is the ligament of Treitz. Large duodenal diverticulum evident. No small bowel wall thickening. No small bowel dilatation. The terminal ileum is normal. The appendix  is normal. No gross colonic mass. No colonic wall thickening. Diverticuli are seen scattered along the entire length of the colon without CT findings of diverticulitis. Vascular/Lymphatic: There is abdominal aortic atherosclerosis without aneurysm. There is no gastrohepatic or hepatoduodenal ligament lymphadenopathy. No intraperitoneal or retroperitoneal lymphadenopathy. No pelvic sidewall lymphadenopathy. Reproductive: Prostate gland is enlarged. Other: No intraperitoneal free fluid. Musculoskeletal: No worrisome lytic or sclerotic osseous abnormality. Status post lumbar fusion. IMPRESSION: 1. Numerous bilateral irregular/spiculated pulmonary nodules measuring up to 14 mm. Imaging features are nonspecific and could be related to an infectious etiology or neoplasm. 2. Mild asymmetric left bladder wall thickening, likely present on the study from over 4 years ago although less conspicuous on that exam due to lack of intravenous contrast administration. 3. Hepatic steatosis. 4. Bilateral renal cysts with 8 mm nonobstructing right renal stone. 5.  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   On: 02/12/2019 20:57   Ct Abdomen Pelvis W Contrast  Result Date: 02/12/2019 CLINICAL DATA:  Fever of unknown origin. Cough with loose stools. AML. EXAM: CT CHEST, ABDOMEN, AND PELVIS WITH CONTRAST TECHNIQUE: Multidetector CT imaging of the chest, abdomen and pelvis was performed following the standard protocol during bolus administration of intravenous contrast. CONTRAST:  160m OMNIPAQUE IOHEXOL 300 MG/ML SOLN, 378mOMNIPAQUE IOHEXOL 300 MG/ML SOLN COMPARISON:  CT urogram 09/29/2014. FINDINGS: CT CHEST FINDINGS Cardiovascular: The heart size  is normal. No substantial pericardial effusion. No thoracic aortic aneurysm. Right Port-A-Cath tip is positioned in the mid SVC. Mediastinum/Nodes: No mediastinal lymphadenopathy. There is no hilar lymphadenopathy. The esophagus has normal imaging features. There is no axillary lymphadenopathy. Lungs/Pleura: Numerous irregular bilateral pulmonary nodules are identified. One of the more dominant nodules is seen in the left upper lobe on 35/3, measuring 1.4 cm diameter. Index nodule right upper lobe on 75/3 measures 8 mm. Left lower lobe index nodule on 94/3 is 10 mm. No focal airspace consolidation. No pleural effusion. Musculoskeletal: No worrisome lytic or sclerotic osseous abnormality. CT ABDOMEN PELVIS FINDINGS Hepatobiliary: The liver shows diffusely decreased attenuation suggesting fat deposition. No suspicious focal abnormality within the liver parenchyma. Gallbladder is nondistended. No intrahepatic or extrahepatic biliary dilation. Pancreas: No focal mass lesion. No dilatation of the main duct. No intraparenchymal cyst. No peripancreatic edema. Spleen: No splenomegaly. No focal mass lesion. Adrenals/Urinary Tract: No adrenal nodule or mass. Left kidney is markedly atrophic with 3.6 cm interpolar cystic lesion similar to prior study. 5.7 cm cystic lesion anterior interpolar right kidney was 5.4 cm previously. 14 mm low-density lesion upper interpolar right kidney (69/2) was present previously and is not substantially changed. 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 8 mm nonobstructing stone identified lower pole right kidney. No evidence for hydroureter. Mild but asymmetric left bladder wall thickening evident (image 112/2. This was probably present on the previous study but less conspicuous. Stomach/Bowel: Stomach is unremarkable. No gastric wall thickening. No evidence of outlet obstruction. Duodenum is normally positioned as is the ligament of Treitz. Large duodenal diverticulum evident. No  small bowel wall thickening. No small bowel dilatation. The terminal ileum is normal. The appendix is normal. No gross colonic mass. No colonic wall thickening. Diverticuli are seen scattered along the entire length of the colon without CT findings of diverticulitis. Vascular/Lymphatic: There is abdominal aortic atherosclerosis without aneurysm. There is no gastrohepatic or hepatoduodenal ligament lymphadenopathy. No intraperitoneal or retroperitoneal lymphadenopathy. No pelvic sidewall lymphadenopathy. Reproductive: Prostate gland is enlarged. Other: No intraperitoneal free fluid. Musculoskeletal:  No worrisome lytic or sclerotic osseous abnormality. Status post lumbar fusion. IMPRESSION: 1. Numerous bilateral irregular/spiculated pulmonary nodules measuring up to 14 mm. Imaging features are nonspecific and could be related to an infectious etiology or neoplasm. 2. Mild asymmetric left bladder wall thickening, likely present on the study from over 4 years ago although less conspicuous on that exam due to lack of intravenous contrast administration. 3. Hepatic steatosis. 4. Bilateral renal cysts with 8 mm nonobstructing right renal stone. 5.  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   On: 02/12/2019 20:57   Dg Chest Port 1 View  Result Date: 02/09/2019 CLINICAL DATA:  Fever and cough. Patient has leukemia receiving chemotherapy. EXAM: PORTABLE CHEST 1 VIEW COMPARISON:  08/28/2018 FINDINGS: Right subclavian Port-A-Cath unchanged. Lungs are adequately inflated without focal airspace consolidation or effusion. Cardiomediastinal silhouette and remainder the exam is unchanged. IMPRESSION: No active disease. Electronically Signed   By: Marin Olp M.D.   On: 02/09/2019 14:26   [place] Ekg Site Rite  Result Date: 02/18/2019 If Site Rite image not attached, placement could not be confirmed due to current cardiac rhythm.    Microbiology: Recent Results (from the past 240  hour(s))  Blood Culture (routine x 2)     Status: Abnormal   Collection Time: 02/09/19  1:30 PM   Specimen: Right Antecubital; Blood  Result Value Ref Range Status   Specimen Description   Final    RIGHT ANTECUBITAL Performed at [facility], [address], [place], [place] [address]    Special Requests   Final    BOTTLES DRAWN AEROBIC AND ANAEROBIC Blood Culture results may not be optimal due to an excessive volume of blood received in culture bottles Performed at [facility] [address], [place], [place] [address]    Culture  Setup Time   Final    GRAM POSITIVE COCCI BONDURANT,R @ 0210 ON 02/10/19 BY JUW ANAEROBIC BOTTLE ONLY GS DONE @ [facility] CRITICAL RESULT CALLED TO, READ BACK BY AND VERIFIED WITH: PHARMD STEVENS H. [phone] FCP Performed at [facility], [place] [address], [place], [place] [address]    Culture STREPTOCOCCUS INFANTARIUS (A)  Final   Report Status 02/12/2019 FINAL  Final   Organism ID, Bacteria STREPTOCOCCUS INFANTARIUS  Final      Susceptibility   Streptococcus infantarius - MIC*    PENICILLIN <=0.06 SENSITIVE Sensitive     CEFTRIAXONE <=0.12 SENSITIVE Sensitive     ERYTHROMYCIN <=0.12 SENSITIVE Sensitive     LEVOFLOXACIN 4 INTERMEDIATE Intermediate     VANCOMYCIN 0.5 SENSITIVE Sensitive     * STREPTOCOCCUS INFANTARIUS  Blood Culture ID Panel (Reflexed)     Status: Abnormal   Collection Time: 02/09/19  1:30 PM  Result Value Ref Range Status   Enterococcus species NOT DETECTED NOT DETECTED Final   Listeria monocytogenes NOT DETECTED NOT DETECTED Final   Staphylococcus species NOT DETECTED NOT DETECTED Final   Staphylococcus aureus (BCID) NOT DETECTED NOT DETECTED Final   Streptococcus species DETECTED (A) NOT DETECTED Final    Comment: Not Enterococcus species, Streptococcus agalactiae, Streptococcus pyogenes, or Streptococcus pneumoniae. CRITICAL RESULT CALLED TO, READ BACK BY AND VERIFIED WITH: PHARMD STEVENS H. [phone] FCP    Streptococcus  agalactiae NOT DETECTED NOT DETECTED Final   Streptococcus pneumoniae NOT DETECTED NOT DETECTED Final   Streptococcus pyogenes NOT DETECTED NOT DETECTED Final   Acinetobacter baumannii NOT DETECTED NOT DETECTED Final   Enterobacteriaceae species NOT DETECTED NOT DETECTED  Final   Enterobacter cloacae complex NOT DETECTED NOT DETECTED Final   Escherichia coli NOT DETECTED NOT DETECTED Final   Klebsiella oxytoca NOT DETECTED NOT DETECTED Final   Klebsiella pneumoniae NOT DETECTED NOT DETECTED Final   Proteus species NOT DETECTED NOT DETECTED Final   Serratia marcescens NOT DETECTED NOT DETECTED Final   Haemophilus influenzae NOT DETECTED NOT DETECTED Final   Neisseria meningitidis NOT DETECTED NOT DETECTED Final   Pseudomonas aeruginosa NOT DETECTED NOT DETECTED Final   Candida albicans NOT DETECTED NOT DETECTED Final   Candida glabrata NOT DETECTED NOT DETECTED Final   Candida krusei NOT DETECTED NOT DETECTED Final   Candida parapsilosis NOT DETECTED NOT DETECTED Final   Candida tropicalis NOT DETECTED NOT DETECTED Final    Comment: Performed at [facility], [place] [address] [place], [place] [address]  Blood Culture (routine x 2)     Status: Abnormal   Collection Time: 02/09/19  1:33 PM   Specimen: BLOOD RIGHT HAND  Result Value Ref Range Status   Specimen Description   Final    BLOOD RIGHT HAND Performed at [facility], [address], [place], [place] [address]    Special Requests   Final    BOTTLES DRAWN AEROBIC AND ANAEROBIC Blood Culture adequate volume Performed at [facility], [address] [place], [place] [address]    Culture  Setup Time   Final    GRAM POSITIVE COCCI Gram Stain Report Called to,Read Back By and Verified With: BONDURANT,R @ 0210 ON 02/10/19 BY JUW ANAEROBIC BOTTLE ONLY GS DONE @ [facility]    Culture (A)  Final    STREPTOCOCCUS INFANTARIUS SUSCEPTIBILITIES PERFORMED ON PREVIOUS CULTURE WITHIN THE LAST 5 DAYS. Performed at [facility], [place] [address], [place], [place] [address]    Report Status 02/12/2019 FINAL  Final  SARS Coronavirus 2 [facility] order, Performed in [facility] hospital lab) Nasopharyngeal Nasopharyngeal Swab     Status: None   Collection Time: 02/09/19  2:00 PM   Specimen: Nasopharyngeal Swab  Result Value Ref Range Status   SARS Coronavirus 2 NEGATIVE NEGATIVE Final    Comment: (NOTE) If result is NEGATIVE SARS-CoV-2 target nucleic acids are NOT DETECTED. The SARS-CoV-2 RNA is generally detectable in upper and lower  respiratory specimens during the acute phase of infection. The lowest  concentration of SARS-CoV-2 viral copies this assay can detect is 250  copies / mL. A negative result does not preclude SARS-CoV-2 infection  and should not be used as the sole basis for treatment or other  patient management decisions.  A negative result may occur with  improper specimen collection / handling, submission of specimen other  than nasopharyngeal swab, presence of viral mutation(s) within the  areas targeted by this assay, and inadequate number of viral copies  (<250 copies / mL). A negative result must be combined with clinical  observations, patient history, and epidemiological information. If result is POSITIVE SARS-CoV-2 target nucleic acids are DETECTED. The SARS-CoV-2 RNA is generally detectable in upper and lower  respiratory specimens dur ing the acute phase of infection.  Positive  results are indicative of active infection with SARS-CoV-2.  Clinical  correlation with patient history and other diagnostic information is  necessary to determine patient infection status.  Positive results do  not rule out bacterial infection or co-infection with other viruses. If result is PRESUMPTIVE POSTIVE SARS-CoV-2 nucleic acids MAY BE PRESENT.   A presumptive positive result was obtained on the submitted  specimen  and confirmed on repeat testing.  While 2019 novel coronavirus  (SARS-CoV-2) nucleic acids may be  present in the submitted sample  additional confirmatory testing may be necessary for epidemiological  and / or clinical management purposes  to differentiate between  SARS-CoV-2 and other Sarbecovirus currently known to infect humans.  If clinically indicated additional testing with an alternate test  methodology [phone]) is advised. The SARS-CoV-2 RNA is generally  detectable in upper and lower respiratory sp ecimens during the acute  phase of infection. The expected result is Negative. Fact Sheet for Patients:  StrictlyIdeas.no Fact Sheet for Healthcare Providers: BankingDealers.co.za This test is not yet approved or cleared by the [place] FDA and has been authorized for detection and/or diagnosis of SARS-CoV-2 by FDA under an Emergency Use Authorization (EUA).  This EUA will remain in effect (meaning this test can be used) for the duration of the COVID-19 declaration under Section 564(b)(1) of the Act, 21 U.S.C. section 360bbb-3(b)(1), unless the authorization is terminated or revoked sooner. Performed at [facility], [address], [place], [place] [address]   Urine culture     Status: Abnormal   Collection Time: 02/09/19  3:45 PM   Specimen: In/Out Cath Urine  Result Value Ref Range Status   Specimen Description   Final    IN/OUT CATH URINE Performed at [facility], [address], [place], [place] [address]    Special Requests   Final    Normal Performed at [facility], [address], [place], [place] [address]    Culture 10,000 COLONIES/mL STAPHYLOCOCCUS LUGDUNENSIS (A)  Final   Report Status 02/11/2019 FINAL  Final   Organism ID, Bacteria STAPHYLOCOCCUS LUGDUNENSIS (A)  Final      Susceptibility   Staphylococcus lugdunensis - MIC*    CIPROFLOXACIN <=0.5 SENSITIVE Sensitive     GENTAMICIN <=0.5 SENSITIVE Sensitive     NITROFURANTOIN <=16 SENSITIVE Sensitive     OXACILLIN >=4 RESISTANT Resistant     TETRACYCLINE  2 SENSITIVE Sensitive     VANCOMYCIN <=0.5 SENSITIVE Sensitive     TRIMETH/SULFA <=10 SENSITIVE Sensitive     CLINDAMYCIN >=8 RESISTANT Resistant     RIFAMPIN <=0.5 SENSITIVE Sensitive     Inducible Clindamycin NEGATIVE Sensitive     * 10,000 COLONIES/mL STAPHYLOCOCCUS LUGDUNENSIS  Culture, blood (single)     Status: None   Collection Time: 02/10/19  5:27 AM   Specimen: Porta Cath; Blood  Result Value Ref Range Status   Specimen Description PORTA CATH BOTTLES DRAWN AEROBIC AND ANAEROBIC  Final   Special Requests Blood Culture adequate volume  Final   Culture   Final    NO GROWTH 5 DAYS Performed at [facility], [address], [place], [place] [address]    Report Status 02/15/2019 FINAL  Final  Culture, blood (Routine X 2) w Reflex to ID Panel     Status: None   Collection Time: 02/11/19  2:13 PM   Specimen: BLOOD RIGHT WRIST  Result Value Ref Range Status   Specimen Description BLOOD RIGHT WRIST  Final   Special Requests   Final    BOTTLES DRAWN AEROBIC AND ANAEROBIC Blood Culture results may not be optimal due to an excessive volume of blood received in culture bottles   Culture   Final    NO GROWTH 6 DAYS Performed at [facility], [address], [place], [place] [address]    Report Status 02/17/2019 FINAL  Final  Culture, blood (Routine X 2) w Reflex  to ID Panel     Status: None   Collection Time: 02/11/19  2:18 PM   Specimen: BLOOD LEFT ARM  Result Value Ref Range Status   Specimen Description BLOOD LEFT ARM  Final   Special Requests   Final    BOTTLES DRAWN AEROBIC AND ANAEROBIC Blood Culture results may not be optimal due to an excessive volume of blood received in culture bottles   Culture   Final    NO GROWTH 6 DAYS Performed at [facility], [address] [place], [place] [address]    Report Status 02/17/2019 FINAL  Final  MRSA PCR Screening     Status: None   Collection Time: 02/12/19  7:38 PM   Specimen: Nasopharyngeal  Result Value Ref Range Status   MRSA  by PCR NEGATIVE NEGATIVE Final    Comment:        The GeneXpert MRSA Assay (FDA approved for NASAL specimens only), is one component of a comprehensive MRSA colonization surveillance program. It is not intended to diagnose MRSA infection nor to guide or monitor treatment for MRSA infections. Performed at [facility], [address] [place], [place] [address]   Aspergillus Ag, BAL/Serum     Status: None   Collection Time: 02/13/19  6:41 AM  Result Value Ref Range Status   Aspergillus Ag, BAL/Serum 0.04 0.00 - 0.49 Index Final    Comment: (NOTE) Performed At: [facility] [address] [place], [place] [id number] Rush Farmer MD EH:[id number] Performed At: [facility] [place], [place] [id number] Katina Degree MDPhD QX:[phone]   Respiratory Panel by PCR     Status: None   Collection Time: 02/13/19  5:12 PM   Specimen: Nasopharyngeal Swab; Respiratory  Result Value Ref Range Status   Adenovirus NOT DETECTED NOT DETECTED Final   Coronavirus 229E NOT DETECTED NOT DETECTED Final    Comment: (NOTE) The Coronavirus on the Respiratory Panel, DOES NOT test for the novel  Coronavirus (2019 nCoV)    Coronavirus HKU1 NOT DETECTED NOT DETECTED Final   Coronavirus NL63 NOT DETECTED NOT DETECTED Final   Coronavirus OC43 NOT DETECTED NOT DETECTED Final   Metapneumovirus NOT DETECTED NOT DETECTED Final   Rhinovirus / Enterovirus NOT DETECTED NOT DETECTED Final   Influenza A NOT DETECTED NOT DETECTED Final   Influenza B NOT DETECTED NOT DETECTED Final   Parainfluenza Virus 1 NOT DETECTED NOT DETECTED Final   Parainfluenza Virus 2 NOT DETECTED NOT DETECTED Final   Parainfluenza Virus 3 NOT DETECTED NOT DETECTED Final   Parainfluenza Virus 4 NOT DETECTED NOT DETECTED Final   Respiratory Syncytial Virus NOT DETECTED NOT DETECTED Final   Bordetella pertussis NOT DETECTED NOT DETECTED Final   Chlamydophila pneumoniae NOT DETECTED NOT DETECTED Final   Mycoplasma  pneumoniae NOT DETECTED NOT DETECTED Final    Comment: Performed at [facility], [place] [address], [place], [place] [address]  Cath Tip Culture     Status: None   Collection Time: 02/14/19 10:22 AM   Specimen: Catheter Tip  Result Value Ref Range Status   Specimen Description CATH TIP  Final   Special Requests CATH TIP RECEIVED IN COPAN SWAB  Final   Culture   Final    NO GROWTH 3 DAYS Performed at [facility], [address] [place], [place] [address]    Report Status 02/18/2019 FINAL  Final     Labs: Basic Metabolic Panel: Recent Labs  Lab 02/13/19 [phone] 02/14/19 0450  02/15/19 0619 02/17/19 0449 02/18/19 0508  NA 137  --  138 140 140  K 4.9  --  4.3 4.6 4.4  CL 102  --  103 103 104  CO2 27  --  '27 26 25  ' GLUCOSE 103*  --  105* 95 100*  BUN 26*  --  27* 22 20  CREATININE 1.10 1.27* 1.08 1.10 1.00  CALCIUM 9.0  --  9.0 9.4 9.1  MG  --   --   --  2.1 2.2   Liver Function Tests: Recent Labs  Lab 02/13/19 0641  AST 29  ALT 44  ALKPHOS 70  BILITOT 1.3*  PROT 6.1*  ALBUMIN 2.9*   No results for input(s): LIPASE, AMYLASE in the last 168 hours. No results for input(s): AMMONIA in the last 168 hours. CBC: Recent Labs  Lab 02/12/19 0501 02/13/19 0641 02/14/19 0458 02/15/19 0619 02/16/19 1614 02/17/19 0449 02/18/19 0508  WBC 0.4* 0.6* 0.6* 0.8* 3.8* 4.0 3.5*  NEUTROABS 0.0* 0.0*  --  0.3*  --  2.0 1.6*  HGB 7.5* 7.4* 7.1* 6.8* 9.3* 9.7* 9.7*  HCT 25.5* 25.4* 24.2* 23.0* 31.2* 32.6* 32.8*  MCV 103.7* 104.5* 104.3* 103.1* 100.0 101.6* 100.9*  PLT 17* 20* 25* 65* 58* 63* 73*   Cardiac Enzymes: No results for input(s): CKTOTAL, CKMB, CKMBINDEX, TROPONINI in the last 168 hours. BNP: Invalid input(s): POCBNP CBG: No results for input(s): GLUCAP in the last 168 hours.  Time coordinating discharge:  36 minutes  Signed:  Orson Eva, DO [facility] Pager: [phone] 02/18/2019, 1:16 PM

## 2019-02-17 NOTE — Progress Notes (Signed)
PROGRESS NOTE  Richard Wallace [id number] DOB: April 14, 1946 DOA: 02/09/2019 PCP: [name], MD   Brief History: 73 y.o.malewith medical history significant ofactive chemotherapy for AML, last tx 2 weeks ago, CKD, GERD, HTN, atrophy of kidney and dc from here 5 days ago where he was admitted for rectal bleeding and severe thrombocytopenia (9000) nowpresenting with fever to 104 since last nightw/o sweats but with rigors, mild nonproductive cough (denies sob) and sore throat. He endorses nausea but this is a chronic finding, not new.He denies diarrhea but has had some loose stools. Denies mucus or blood in the stool currently..Denies chest pain, headache, abdominal pain, dysuria. He denies unusual bruising or petechiae. No further rectal bleeding since the day of dc from here 9/29. He reports feeling profoundly weak without dizziness, no focal weakness. The patient was admitted for treatment of neutropenic fever. Blood culture showed Streptococcus infantarius. Because of persistent fevers, general surgery was consulted for Port-A-Cath removal. Antibiotics were narrowed to ceftriaxone.  His Port-A-Cath was removed on 02/14/19.  Fortunately, his blood WBC and platelet counts began to recover.  His granix was discontinued and his WBC count remained stable.  During the hospitalization, he received 9 units of platelets and 3 units PRBCs  Assessment/Plan: neutropenic fever -Blood cultures positive for Streptococcus species bacteremia -pt continues to remain neutropenic and febrile although fever curve is decreasing -Continue supportive care -10/12--discontinue granix--last dose 02/15/19 -consult surgery to remove port-a-cathdue to persistent fever -CT chest/abd/pelvis--no lymphadenopathy. Numerous bilateral pulmonary nodules; no consolidation, no pleural effusion; no hydronephrosis; no hydroureter; diverticulosis; no retroperitoneal lymphadenopathy -case discussed withID,  Dr. Comer--->proceed in stepwise fashion-->remove port-a-cath out first and if fever persists, then pursue treatment/diagnosis of pulm nodules -02/14/19--port-a-cath--removed -checked fungal antibodies, serum beta-glucan  Bacteremia -strep infantarius -with persistent fever, consultedsurgery to remove port-a-cath -02/14/19--port-a-cath--surgically excised-->culture neg -case discussed with Dr. Aviva Signs  leukemia/MDS -appreciate med/onc follow up -case discussed with Dr. Delton Coombes -Patient actively receiving chemotherapyas an outpatient. -Cycle 1 of decitabine and venetoclax on 01/20/2019. -Venetoclax held on 02/03/2019 as his platelet count was low at 9000. -Holding chemotherapeutic agents at this time.  pancytopenia -Patient received 3 units of plateletson admission,as his platelet levelwas7000. 10/8--transfuse 2 more units platelets(7 total for admission) -02/14/19--transfuse 2 more units platelets after surgery (9 units total) -having intermittent rectal bleeding. -transfused1 unit of PRBCs 02/11/19 -02/15/19--transfuse 2 units PRBC  essential hypertension -Currently not taking any medication as an outpatient -Continue to monitor vital signs and initiate antihypertensive management if needed.  Staphylococcus lugdunensisbacteruria -Patient denies dysuria -No treatment will be provided for this isolated microorganism currently as per ID recommendations.  Mucositis  -started magic mouthwash with lidocaine  Lung Nodules -incidental findings on CT chestbut may represent opportunistic infection if fever persists     Disposition Plan:Not stable for d/c Family Communication:Wife updatedat bedside 02/17/19  Consultants:Med/onc; general surgery  Code Status: FULL  DVT Prophylaxis: SCDs   Procedures: As Listed in Progress Note Above  Antibiotics: Ceftriaxone 10/5>>>      Subjective: Pt is feeling like he has more  energy.  He denies f/c, cp, sob, n/v/d.  His mouth remains sore, but slowly.  He was able to eat a twinkie today.    Objective: Vitals:   02/16/19 1430 02/16/19 2231 02/17/19 0551 02/17/19 1413  BP: 136/81 (!) 115/49 138/81 132/83  Pulse: 85 83 80 94  Resp: 17 16 20 18   Temp: 99.2 F (37.3 C) 99.3 F (37.4 C) 99.1 F (  37.3 C) 97.7 F (36.5 C)  TempSrc:  Oral Oral Oral  SpO2: 99% 97% 96% 97%  Weight:      Height:        Intake/Output Summary (Last 24 hours) at 02/17/2019 1547 Last data filed at 02/17/2019 0551 Gross per 24 hour  Intake --  Output 200 ml  Net -200 ml   Weight change:  Exam:   General:  Pt is alert, follows commands appropriately, not in acute distress  HEENT: No icterus, No thrush, No neck mass, Susan Moore/AT  Cardiovascular: RRR, S1/S2, no rubs, no gallops  Respiratory: CTA bilaterally, no wheezing, no crackles, no rhonchi  Abdomen: Soft/+BS, non tender, non distended, no guarding  Extremities: 1+ nonpitting edema, No lymphangitis, No petechiae, No rashes, no synovitis   Data Reviewed: I have personally reviewed following labs and imaging studies Basic Metabolic Panel: Recent Labs  Lab 02/11/19 0542 02/12/19 0501 02/13/19 0641 02/14/19 0450 02/15/19 0619 02/17/19 0449  NA 136  --  137  --  138 140  K 4.0  --  4.9  --  4.3 4.6  CL 107  --  102  --  103 103  CO2 24  --  27  --  27 26  GLUCOSE 103*  --  103*  --  105* 95  BUN 24*  --  26*  --  27* 22  CREATININE 1.02 0.94 1.10 1.27* 1.08 1.10  CALCIUM 8.5*  --  9.0  --  9.0 9.4  MG  --   --   --   --   --  2.1   Liver Function Tests: Recent Labs  Lab 02/13/19 0641  AST 29  ALT 44  ALKPHOS 70  BILITOT 1.3*  PROT 6.1*  ALBUMIN 2.9*   No results for input(s): LIPASE, AMYLASE in the last 168 hours. No results for input(s): AMMONIA in the last 168 hours. Coagulation Profile: No results for input(s): INR, PROTIME in the last 168 hours. CBC: Recent Labs  Lab 02/11/19 0542 02/12/19 0501  02/13/19 0641 02/14/19 0458 02/15/19 0619 02/16/19 1614 02/17/19 0449  WBC 0.4* 0.4* 0.6* 0.6* 0.8* 3.8* 4.0  NEUTROABS 0.0* 0.0* 0.0*  --  0.3*  --  2.0  HGB 6.7* 7.5* 7.4* 7.1* 6.8* 9.3* 9.7*  HCT 23.5* 25.5* 25.4* 24.2* 23.0* 31.2* 32.6*  MCV 106.3* 103.7* 104.5* 104.3* 103.1* 100.0 101.6*  PLT 27* 17* 20* 25* 65* 58* 63*   Cardiac Enzymes: No results for input(s): CKTOTAL, CKMB, CKMBINDEX, TROPONINI in the last 168 hours. BNP: Invalid input(s): POCBNP CBG: No results for input(s): GLUCAP in the last 168 hours. HbA1C: No results for input(s): HGBA1C in the last 72 hours. Urine analysis:    Component Value Date/Time   COLORURINE AMBER (A) 02/09/2019 1545   APPEARANCEUR CLEAR 02/09/2019 1545   LABSPEC 1.024 02/09/2019 1545   PHURINE 5.0 02/09/2019 1545   GLUCOSEU NEGATIVE 02/09/2019 1545   HGBUR SMALL (A) 02/09/2019 1545   BILIRUBINUR NEGATIVE 02/09/2019 1545   KETONESUR NEGATIVE 02/09/2019 1545   PROTEINUR 100 (A) 02/09/2019 1545   UROBILINOGEN 1.0 08/24/2014 1630   NITRITE NEGATIVE 02/09/2019 1545   LEUKOCYTESUR NEGATIVE 02/09/2019 1545   Sepsis Labs: @LABRCNTIP (procalcitonin:4,lacticidven:4) ) Recent Results (from the past 240 hour(s))  Blood Culture (routine x 2)     Status: Abnormal   Collection Time: 02/09/19  1:30 PM   Specimen: Right Antecubital; Blood  Result Value Ref Range Status   Specimen Description   Final    RIGHT ANTECUBITAL  Performed at [facility], [address] [place], [place] [address]    Special Requests   Final    BOTTLES DRAWN AEROBIC AND ANAEROBIC Blood Culture results may not be optimal due to an excessive volume of blood received in culture bottles Performed at [facility], [address] [place], [place] [address]    Culture  Setup Time   Final    GRAM POSITIVE COCCI BONDURANT,R @ 0210 ON 02/10/19 BY JUW ANAEROBIC BOTTLE ONLY GS DONE @ [facility] CRITICAL RESULT CALLED TO, READ BACK BY AND VERIFIED WITH: PHARMD STEVENS H. [id number] [id number]  FCP Performed at [facility], [place] [address], [place], [place] [address]    Culture STREPTOCOCCUS INFANTARIUS (A)  Final   Report Status 02/12/2019 FINAL  Final   Organism ID, Bacteria STREPTOCOCCUS INFANTARIUS  Final      Susceptibility   Streptococcus infantarius - MIC*    PENICILLIN <=0.06 SENSITIVE Sensitive     CEFTRIAXONE <=0.12 SENSITIVE Sensitive     ERYTHROMYCIN <=0.12 SENSITIVE Sensitive     LEVOFLOXACIN 4 INTERMEDIATE Intermediate     VANCOMYCIN 0.5 SENSITIVE Sensitive     * STREPTOCOCCUS INFANTARIUS  Blood Culture ID Panel (Reflexed)     Status: Abnormal   Collection Time: 02/09/19  1:30 PM  Result Value Ref Range Status   Enterococcus species NOT DETECTED NOT DETECTED Final   Listeria monocytogenes NOT DETECTED NOT DETECTED Final   Staphylococcus species NOT DETECTED NOT DETECTED Final   Staphylococcus aureus (BCID) NOT DETECTED NOT DETECTED Final   Streptococcus species DETECTED (A) NOT DETECTED Final    Comment: Not Enterococcus species, Streptococcus agalactiae, Streptococcus pyogenes, or Streptococcus pneumoniae. CRITICAL RESULT CALLED TO, READ BACK BY AND VERIFIED WITH: PHARMD STEVENS H. [phone] [id number] FCP    Streptococcus agalactiae NOT DETECTED NOT DETECTED Final   Streptococcus pneumoniae NOT DETECTED NOT DETECTED Final   Streptococcus pyogenes NOT DETECTED NOT DETECTED Final   Acinetobacter baumannii NOT DETECTED NOT DETECTED Final   Enterobacteriaceae species NOT DETECTED NOT DETECTED Final   Enterobacter cloacae complex NOT DETECTED NOT DETECTED Final   Escherichia coli NOT DETECTED NOT DETECTED Final   Klebsiella oxytoca NOT DETECTED NOT DETECTED Final   Klebsiella pneumoniae NOT DETECTED NOT DETECTED Final   Proteus species NOT DETECTED NOT DETECTED Final   Serratia marcescens NOT DETECTED NOT DETECTED Final   Haemophilus influenzae NOT DETECTED NOT DETECTED Final   Neisseria meningitidis NOT DETECTED NOT DETECTED Final   Pseudomonas aeruginosa NOT  DETECTED NOT DETECTED Final   Candida albicans NOT DETECTED NOT DETECTED Final   Candida glabrata NOT DETECTED NOT DETECTED Final   Candida krusei NOT DETECTED NOT DETECTED Final   Candida parapsilosis NOT DETECTED NOT DETECTED Final   Candida tropicalis NOT DETECTED NOT DETECTED Final    Comment: Performed at [facility], [place]. [address] [place], [place] [address]  Blood Culture (routine x 2)     Status: Abnormal   Collection Time: 02/09/19  1:33 PM   Specimen: BLOOD RIGHT HAND  Result Value Ref Range Status   Specimen Description   Final    BLOOD RIGHT HAND Performed at [facility], [address] [place], [place] [address]    Special Requests   Final    BOTTLES DRAWN AEROBIC AND ANAEROBIC Blood Culture adequate volume Performed at [facility], [address] [place],  [address]    Culture  Setup Time   Final    GRAM POSITIVE COCCI Gram Stain Report Called  to,Read Back By and Verified With: BONDURANT,R @ 0210 ON 02/10/19 BY JUW ANAEROBIC BOTTLE ONLY GS DONE @ [facility]    Culture (A)  Final    STREPTOCOCCUS INFANTARIUS SUSCEPTIBILITIES PERFORMED ON PREVIOUS CULTURE WITHIN THE LAST 5 DAYS. Performed at [facility], [place] [address] [place], [place] [address]    Report Status 02/12/2019 FINAL  Final  SARS Coronavirus 2 [facility] order, Performed in [facility] hospital lab) Nasopharyngeal Nasopharyngeal Swab     Status: None   Collection Time: 02/09/19  2:00 PM   Specimen: Nasopharyngeal Swab  Result Value Ref Range Status   SARS Coronavirus 2 NEGATIVE NEGATIVE Final    Comment: (NOTE) If result is NEGATIVE SARS-CoV-2 target nucleic acids are NOT DETECTED. The SARS-CoV-2 RNA is generally detectable in upper and lower  respiratory specimens during the acute phase of infection. The lowest  concentration of SARS-CoV-2 viral copies this assay can detect is 250  copies / mL. A negative result does not preclude SARS-CoV-2 infection  and should not be used as the  sole basis for treatment or other  patient management decisions.  A negative result may occur with  improper specimen collection / handling, submission of specimen other  than nasopharyngeal swab, presence of viral mutation(s) within the  areas targeted by this assay, and inadequate number of viral copies  (<250 copies / mL). A negative result must be combined with clinical  observations, patient history, and epidemiological information. If result is POSITIVE SARS-CoV-2 target nucleic acids are DETECTED. The SARS-CoV-2 RNA is generally detectable in upper and lower  respiratory specimens dur ing the acute phase of infection.  Positive  results are indicative of active infection with SARS-CoV-2.  Clinical  correlation with patient history and other diagnostic information is  necessary to determine patient infection status.  Positive results do  not rule out bacterial infection or co-infection with other viruses. If result is PRESUMPTIVE POSTIVE SARS-CoV-2 nucleic acids MAY BE PRESENT.   A presumptive positive result was obtained on the submitted specimen  and confirmed on repeat testing.  While 2019 novel coronavirus  (SARS-CoV-2) nucleic acids may be present in the submitted sample  additional confirmatory testing may be necessary for epidemiological  and / or clinical management purposes  to differentiate between  SARS-CoV-2 and other Sarbecovirus currently known to infect humans.  If clinically indicated additional testing with an alternate test  methodology [phone]) is advised. The SARS-CoV-2 RNA is generally  detectable in upper and lower respiratory sp ecimens during the acute  phase of infection. The expected result is Negative. Fact Sheet for Patients:  StrictlyIdeas.no Fact Sheet for Healthcare Providers: BankingDealers.co.za This test is not yet approved or cleared by the [place] FDA and has been authorized for detection  and/or diagnosis of SARS-CoV-2 by FDA under an Emergency Use Authorization (EUA).  This EUA will remain in effect (meaning this test can be used) for the duration of the COVID-19 declaration under Section 564(b)(1) of the Act, 21 U.S.C. section 360bbb-3(b)(1), unless the authorization is terminated or revoked sooner. Performed at [facility], [address], [place], [place] [address]   Urine culture     Status: Abnormal   Collection Time: 02/09/19  3:45 PM   Specimen: In/Out Cath Urine  Result Value Ref Range Status   Specimen Description   Final    IN/OUT CATH URINE Performed at [facility], [address], [place], [place] [address]    Special Requests   Final  Normal Performed at [facility], [address], [place], [place] [address]    Culture 10,000 COLONIES/mL STAPHYLOCOCCUS LUGDUNENSIS (A)  Final   Report Status 02/11/2019 FINAL  Final   Organism ID, Bacteria STAPHYLOCOCCUS LUGDUNENSIS (A)  Final      Susceptibility   Staphylococcus lugdunensis - MIC*    CIPROFLOXACIN <=0.5 SENSITIVE Sensitive     GENTAMICIN <=0.5 SENSITIVE Sensitive     NITROFURANTOIN <=16 SENSITIVE Sensitive     OXACILLIN >=4 RESISTANT Resistant     TETRACYCLINE 2 SENSITIVE Sensitive     VANCOMYCIN <=0.5 SENSITIVE Sensitive     TRIMETH/SULFA <=10 SENSITIVE Sensitive     CLINDAMYCIN >=8 RESISTANT Resistant     RIFAMPIN <=0.5 SENSITIVE Sensitive     Inducible Clindamycin NEGATIVE Sensitive     * 10,000 COLONIES/mL STAPHYLOCOCCUS LUGDUNENSIS  Culture, blood (single)     Status: None   Collection Time: 02/10/19  5:27 AM   Specimen: Porta Cath; Blood  Result Value Ref Range Status   Specimen Description PORTA CATH BOTTLES DRAWN AEROBIC AND ANAEROBIC  Final   Special Requests Blood Culture adequate volume  Final   Culture   Final    NO GROWTH 5 DAYS Performed at [facility], [address], [place], [place] [address]    Report Status 02/15/2019 FINAL  Final  Culture, blood (Routine X 2) w Reflex  to ID Panel     Status: None   Collection Time: 02/11/19  2:13 PM   Specimen: BLOOD RIGHT WRIST  Result Value Ref Range Status   Specimen Description BLOOD RIGHT WRIST  Final   Special Requests   Final    BOTTLES DRAWN AEROBIC AND ANAEROBIC Blood Culture results may not be optimal due to an excessive volume of blood received in culture bottles   Culture   Final    NO GROWTH 6 DAYS Performed at [facility], [address], [place], [place] [address]    Report Status 02/17/2019 FINAL  Final  Culture, blood (Routine X 2) w Reflex to ID Panel     Status: None   Collection Time: 02/11/19  2:18 PM   Specimen: BLOOD LEFT ARM  Result Value Ref Range Status   Specimen Description BLOOD LEFT ARM  Final   Special Requests   Final    BOTTLES DRAWN AEROBIC AND ANAEROBIC Blood Culture results may not be optimal due to an excessive volume of blood received in culture bottles   Culture   Final    NO GROWTH 6 DAYS Performed at [facility], [address], [place], [place] [address]    Report Status 02/17/2019 FINAL  Final  MRSA PCR Screening     Status: None   Collection Time: 02/12/19  7:38 PM   Specimen: Nasopharyngeal  Result Value Ref Range Status   MRSA by PCR NEGATIVE NEGATIVE Final    Comment:        The GeneXpert MRSA Assay (FDA approved for NASAL specimens only), is one component of a comprehensive MRSA colonization surveillance program. It is not intended to diagnose MRSA infection nor to guide or monitor treatment for MRSA infections. Performed at [facility], [address], [place],  [address]   Aspergillus Ag, BAL/Serum     Status: None   Collection Time: 02/13/19  6:41 AM  Result Value Ref Range Status   Aspergillus Ag, BAL/Serum 0.04 0.00 - 0.49 Index Final    Comment: (NOTE) Performed At: [facility] [address] [place], [place] [id number] Perlie Gold  Derinda Late MD [id number] Performed At: [facility] [address] [place], [place] [id number] Katina Degree MDPhD [id number]   Respiratory Panel by PCR     Status: None   Collection Time: 02/13/19  5:12 PM   Specimen: Nasopharyngeal Swab; Respiratory  Result Value Ref Range Status   Adenovirus NOT DETECTED NOT DETECTED Final   Coronavirus 229E NOT DETECTED NOT DETECTED Final    Comment: (NOTE) The Coronavirus on the Respiratory Panel, DOES NOT test for the novel  Coronavirus (2019 nCoV)    Coronavirus HKU1 NOT DETECTED NOT DETECTED Final   Coronavirus NL63 NOT DETECTED NOT DETECTED Final   Coronavirus OC43 NOT DETECTED NOT DETECTED Final   Metapneumovirus NOT DETECTED NOT DETECTED Final   Rhinovirus / Enterovirus NOT DETECTED NOT DETECTED Final   Influenza A NOT DETECTED NOT DETECTED Final   Influenza B NOT DETECTED NOT DETECTED Final   Parainfluenza Virus 1 NOT DETECTED NOT DETECTED Final   Parainfluenza Virus 2 NOT DETECTED NOT DETECTED Final   Parainfluenza Virus 3 NOT DETECTED NOT DETECTED Final   Parainfluenza Virus 4 NOT DETECTED NOT DETECTED Final   Respiratory Syncytial Virus NOT DETECTED NOT DETECTED Final   Bordetella pertussis NOT DETECTED NOT DETECTED Final   Chlamydophila pneumoniae NOT DETECTED NOT DETECTED Final   Mycoplasma pneumoniae NOT DETECTED NOT DETECTED Final    Comment: Performed at [facility], [place]. [address] [place], [place] [address]  Cath Tip Culture     Status: None (Preliminary result)   Collection Time: 02/14/19 10:22 AM   Specimen: Catheter Tip  Result Value Ref Range Status   Specimen Description CATH TIP  Final   Special Requests CATH TIP RECEIVED IN COPAN SWAB  Final   Culture   Final    NO GROWTH 3 DAYS Performed at [facility], [place] [address] [place], [place] [address]    Report Status PENDING  Incomplete     Scheduled Meds:  sodium chloride   Intravenous Once   sodium chloride   Intravenous Once   sodium chloride   Intravenous Once   allopurinol  300 mg Oral QPM   docusate sodium  100 mg Oral QHS    loratadine  10 mg Oral Daily   magic mouthwash w/lidocaine  10 mL Oral QID   magnesium oxide  400 mg Oral QHS   Continuous Infusions:  sodium chloride     cefTRIAXone (ROCEPHIN)  IV 2 g (02/17/19 1107)    Procedures/Studies: Ct Chest W Contrast  Result Date: 02/12/2019 CLINICAL DATA:  Fever of unknown origin. Cough with loose stools. AML. EXAM: CT CHEST, ABDOMEN, AND PELVIS WITH CONTRAST TECHNIQUE: Multidetector CT imaging of the chest, abdomen and pelvis was performed following the standard protocol during bolus administration of intravenous contrast. CONTRAST:  133mL OMNIPAQUE IOHEXOL 300 MG/ML SOLN, 39mL OMNIPAQUE IOHEXOL 300 MG/ML SOLN COMPARISON:  CT urogram 09/29/2014. FINDINGS: CT CHEST FINDINGS Cardiovascular: The heart size is normal. No substantial pericardial effusion. No thoracic aortic aneurysm. Right Port-A-Cath tip is positioned in the mid SVC. Mediastinum/Nodes: No mediastinal lymphadenopathy. There is no hilar lymphadenopathy. The esophagus has normal imaging features. There is no axillary lymphadenopathy. Lungs/Pleura: Numerous irregular bilateral pulmonary nodules are identified. One of the more dominant nodules is seen in the left upper lobe on 35/3, measuring 1.4 cm diameter. Index nodule right upper lobe on 75/3 measures 8 mm. Left lower lobe index nodule on 94/3 is 10 mm. No focal airspace consolidation. No pleural effusion. Musculoskeletal:  No worrisome lytic or sclerotic osseous abnormality. CT ABDOMEN PELVIS FINDINGS Hepatobiliary: The liver shows diffusely decreased attenuation suggesting fat deposition. No suspicious focal abnormality within the liver parenchyma. Gallbladder is nondistended. No intrahepatic or extrahepatic biliary dilation. Pancreas: No focal mass lesion. No dilatation of the main duct. No intraparenchymal cyst. No peripancreatic edema. Spleen: No splenomegaly. No focal mass lesion. Adrenals/Urinary Tract: No adrenal nodule or mass. Left kidney is markedly  atrophic with 3.6 cm interpolar cystic lesion similar to prior study. 5.7 cm cystic lesion anterior interpolar right kidney was 5.4 cm previously. 14 mm low-density lesion upper interpolar right kidney (69/2) was present previously and is not substantially changed. 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 8 mm nonobstructing stone identified lower pole right kidney. No evidence for hydroureter. Mild but asymmetric left bladder wall thickening evident (image 112/2. This was probably present on the previous study but less conspicuous. Stomach/Bowel: Stomach is unremarkable. No gastric wall thickening. No evidence of outlet obstruction. Duodenum is normally positioned as is the ligament of Treitz. Large duodenal diverticulum evident. No small bowel wall thickening. No small bowel dilatation. The terminal ileum is normal. The appendix is normal. No gross colonic mass. No colonic wall thickening. Diverticuli are seen scattered along the entire length of the colon without CT findings of diverticulitis. Vascular/Lymphatic: There is abdominal aortic atherosclerosis without aneurysm. There is no gastrohepatic or hepatoduodenal ligament lymphadenopathy. No intraperitoneal or retroperitoneal lymphadenopathy. No pelvic sidewall lymphadenopathy. Reproductive: Prostate gland is enlarged. Other: No intraperitoneal free fluid. Musculoskeletal: No worrisome lytic or sclerotic osseous abnormality. Status post lumbar fusion. IMPRESSION: 1. Numerous bilateral irregular/spiculated pulmonary nodules measuring up to 14 mm. Imaging features are nonspecific and could be related to an infectious etiology or neoplasm. 2. Mild asymmetric left bladder wall thickening, likely present on the study from over 4 years ago although less conspicuous on that exam due to lack of intravenous contrast administration. 3. Hepatic steatosis. 4. Bilateral renal cysts with 8 mm nonobstructing right renal stone. 5.  Aortic Atherosclerois  (ICD10-170.0) Electronically Signed   By: Misty Stanley M.D.   On: 02/12/2019 20:57   Ct Abdomen Pelvis W Contrast  Result Date: 02/12/2019 CLINICAL DATA:  Fever of unknown origin. Cough with loose stools. AML. EXAM: CT CHEST, ABDOMEN, AND PELVIS WITH CONTRAST TECHNIQUE: Multidetector CT imaging of the chest, abdomen and pelvis was performed following the standard protocol during bolus administration of intravenous contrast. CONTRAST:  171mL OMNIPAQUE IOHEXOL 300 MG/ML SOLN, 73mL OMNIPAQUE IOHEXOL 300 MG/ML SOLN COMPARISON:  CT urogram 09/29/2014. FINDINGS: CT CHEST FINDINGS Cardiovascular: The heart size is normal. No substantial pericardial effusion. No thoracic aortic aneurysm. Right Port-A-Cath tip is positioned in the mid SVC. Mediastinum/Nodes: No mediastinal lymphadenopathy. There is no hilar lymphadenopathy. The esophagus has normal imaging features. There is no axillary lymphadenopathy. Lungs/Pleura: Numerous irregular bilateral pulmonary nodules are identified. One of the more dominant nodules is seen in the left upper lobe on 35/3, measuring 1.4 cm diameter. Index nodule right upper lobe on 75/3 measures 8 mm. Left lower lobe index nodule on 94/3 is 10 mm. No focal airspace consolidation. No pleural effusion. Musculoskeletal: No worrisome lytic or sclerotic osseous abnormality. CT ABDOMEN PELVIS FINDINGS Hepatobiliary: The liver shows diffusely decreased attenuation suggesting fat deposition. No suspicious focal abnormality within the liver parenchyma. Gallbladder is nondistended. No intrahepatic or extrahepatic biliary dilation. Pancreas: No focal mass lesion. No dilatation of the main duct. No intraparenchymal cyst. No peripancreatic edema. Spleen: No splenomegaly. No focal mass lesion.  Adrenals/Urinary Tract: No adrenal nodule or mass. Left kidney is markedly atrophic with 3.6 cm interpolar cystic lesion similar to prior study. 5.7 cm cystic lesion anterior interpolar right kidney was 5.4 cm  previously. 14 mm low-density lesion upper interpolar right kidney (69/2) was present previously and is not substantially changed. 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 8 mm nonobstructing stone identified lower pole right kidney. No evidence for hydroureter. Mild but asymmetric left bladder wall thickening evident (image 112/2. This was probably present on the previous study but less conspicuous. Stomach/Bowel: Stomach is unremarkable. No gastric wall thickening. No evidence of outlet obstruction. Duodenum is normally positioned as is the ligament of Treitz. Large duodenal diverticulum evident. No small bowel wall thickening. No small bowel dilatation. The terminal ileum is normal. The appendix is normal. No gross colonic mass. No colonic wall thickening. Diverticuli are seen scattered along the entire length of the colon without CT findings of diverticulitis. Vascular/Lymphatic: There is abdominal aortic atherosclerosis without aneurysm. There is no gastrohepatic or hepatoduodenal ligament lymphadenopathy. No intraperitoneal or retroperitoneal lymphadenopathy. No pelvic sidewall lymphadenopathy. Reproductive: Prostate gland is enlarged. Other: No intraperitoneal free fluid. Musculoskeletal: No worrisome lytic or sclerotic osseous abnormality. Status post lumbar fusion. IMPRESSION: 1. Numerous bilateral irregular/spiculated pulmonary nodules measuring up to 14 mm. Imaging features are nonspecific and could be related to an infectious etiology or neoplasm. 2. Mild asymmetric left bladder wall thickening, likely present on the study from over 4 years ago although less conspicuous on that exam due to lack of intravenous contrast administration. 3. Hepatic steatosis. 4. Bilateral renal cysts with 8 mm nonobstructing right renal stone. 5.  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   On: 02/12/2019 20:57   Dg Chest Port 1 View  Result Date:  02/09/2019 CLINICAL DATA:  Fever and cough. Patient has leukemia receiving chemotherapy. EXAM: PORTABLE CHEST 1 VIEW COMPARISON:  08/28/2018 FINDINGS: Right subclavian Port-A-Cath unchanged. Lungs are adequately inflated without focal airspace consolidation or effusion. Cardiomediastinal silhouette and remainder the exam is unchanged. IMPRESSION: No active disease. Electronically Signed   By: Marin Olp M.D.   On: 02/09/2019 14:26    Orson Eva, DO  [facility] Pager [phone]  If 7PM-7AM, please contact night-coverage [email or web address] Password [email or web address] 02/17/2019, 3:47 PM   LOS: 8 days

## 2019-02-18 ENCOUNTER — Ambulatory Visit (HOSPITAL_COMMUNITY): Payer: Medicare Other

## 2019-02-18 ENCOUNTER — Inpatient Hospital Stay: Payer: Self-pay

## 2019-02-18 DIAGNOSIS — R531 Weakness: Secondary | ICD-10-CM

## 2019-02-18 LAB — CBC WITH DIFFERENTIAL/PLATELET
Abs Immature Granulocytes: 0.19 10*3/uL — ABNORMAL HIGH (ref 0.00–0.07)
Basophils Absolute: 0.1 10*3/uL (ref 0.0–0.1)
Basophils Relative: 1 %
Eosinophils Absolute: 0 10*3/uL (ref 0.0–0.5)
Eosinophils Relative: 0 %
HCT: 32.8 % — ABNORMAL LOW (ref 39.0–52.0)
Hemoglobin: 9.7 g/dL — ABNORMAL LOW (ref 13.0–17.0)
Immature Granulocytes: 6 %
Lymphocytes Relative: 27 %
Lymphs Abs: 0.9 10*3/uL (ref 0.7–4.0)
MCH: 29.8 pg (ref 26.0–34.0)
MCHC: 29.6 g/dL — ABNORMAL LOW (ref 30.0–36.0)
MCV: 100.9 fL — ABNORMAL HIGH (ref 80.0–100.0)
Monocytes Absolute: 0.7 10*3/uL (ref 0.1–1.0)
Monocytes Relative: 20 %
Neutro Abs: 1.6 10*3/uL — ABNORMAL LOW (ref 1.7–7.7)
Neutrophils Relative %: 46 %
Platelets: 73 10*3/uL — ABNORMAL LOW (ref 150–400)
RBC: 3.25 MIL/uL — ABNORMAL LOW (ref 4.22–5.81)
RDW: 17.6 % — ABNORMAL HIGH (ref 11.5–15.5)
WBC: 3.5 10*3/uL — ABNORMAL LOW (ref 4.0–10.5)
nRBC: 0.6 % — ABNORMAL HIGH (ref 0.0–0.2)

## 2019-02-18 LAB — BASIC METABOLIC PANEL
Anion gap: 11 (ref 5–15)
BUN: 20 mg/dL (ref 8–23)
CO2: 25 mmol/L (ref 22–32)
Calcium: 9.1 mg/dL (ref 8.9–10.3)
Chloride: 104 mmol/L (ref 98–111)
Creatinine, Ser: 1 mg/dL (ref 0.61–1.24)
GFR calc Af Amer: >60 mL/min (ref >60)
GFR calc non Af Amer: >60 mL/min (ref >60)
Glucose, Bld: 100 mg/dL — ABNORMAL HIGH (ref 70–99)
Potassium: 4.4 mmol/L (ref 3.5–5.1)
Sodium: 140 mmol/L (ref 135–145)

## 2019-02-18 LAB — CATH TIP CULTURE: Culture: NO GROWTH

## 2019-02-18 LAB — FUNGITELL, SERUM: Fungitell Result: <31 pg/mL (ref <80)

## 2019-02-18 LAB — MAGNESIUM: Magnesium: 2.2 mg/dL (ref 1.7–2.4)

## 2019-02-18 MED ORDER — SODIUM CHLORIDE 0.9% FLUSH: 10.0000 mL | INTRAVENOUS | Status: DC | PRN

## 2019-02-18 MED ORDER — MAGIC MOUTHWASH W/LIDOCAINE: 15.0000 mL | Freq: Four times a day (QID) | ORAL | 0 refills | Status: DC | PRN

## 2019-02-18 MED ORDER — CHLORHEXIDINE GLUCONATE CLOTH 2 % EX PADS: 6.0000 | MEDICATED_PAD | Freq: Every day | CUTANEOUS | Status: DC

## 2019-02-18 MED ORDER — CEFTRIAXONE IV (FOR PTA / DISCHARGE USE ONLY): 2.0000 g | INTRAVENOUS | 0 refills | Status: AC

## 2019-02-18 NOTE — Progress Notes (Signed)
PHARMACY CONSULT NOTE FOR:  OUTPATIENT  PARENTERAL ANTIBIOTIC THERAPY (OPAT)  Indication: neutropenic fever Regimen: ceftriaxone 2g IV q24h End date:  02-27-2019  IV antibiotic discharge orders are pended. To discharging provider:  please sign these orders via discharge navigator,  Select New Orders & click on the button choice - Manage This Unsigned Work.     Thank you for allowing pharmacy to be a part of this patient's care.  Despina Pole 02/18/2019, 11:11 AM

## 2019-02-18 NOTE — TOC Transition Note (Signed)
Transition of Care [facility]) - CM/SW Discharge Note   Patient Details  Name: Baltazar Perotti MRN: [id number] Date of Birth: 1945/09/24  Transition of Care [facility]) CM/SW Contact:  Boneta Lucks, RN Phone Number: 02/18/2019, 11:44 AM   Clinical Narrative:   Patient discharging home today after PICC line insertion. Patient will need home IV antibiotics.  Referral given to [place] with [facility]. Orders placed to RN/PT, Pam called [place]. Patient and wife updated with discharge plan and added to [place] - spoke with Liane Comber  Patient Goals and CMS Choice Patient states their goals for this hospitalization and ongoing recovery are:: to return home with home health. CMS Medicare.gov Compare Post Acute Care list provided to:: Patient Choice offered to / list presented to : Patient  Discharge Placement       Discharge Plan and Services     Post Acute Care Choice: Home Health          HH Arranged: RN, PT [facility] Agency: [place] Date [facility] Agency Contacted: 02/18/19 Time [place]: [id number] Representative spoke with at [place]: Liane Comber - Intake    Readmission Risk Interventions No flowsheet data found.

## 2019-02-18 NOTE — Progress Notes (Signed)
Peripherally Inserted Central Catheter/Midline Placement  The IV Nurse has discussed with the patient and/or persons authorized to consent for the patient, the purpose of this procedure and the potential benefits and risks involved with this procedure.  The benefits include less needle sticks, lab draws from the catheter, and the patient may be discharged home with the catheter. Risks include, but not limited to, infection, bleeding, blood clot (thrombus formation), and puncture of an artery; nerve damage and irregular heartbeat and possibility to perform a PICC exchange if needed/ordered by physician.  Alternatives to this procedure were also discussed.  Bard Power PICC patient education guide, fact sheet on infection prevention and patient information card has been provided to patient /or left at bedside.    PICC/Midline Placement Documentation  PICC Single Lumen [id number] PICC Right Basilic 45 cm 0 cm (Active)  Indication for Insertion or Continuance of Line Home intravenous therapies (PICC only) 02/18/19 1228  Exposed Catheter (cm) 0 cm 02/18/19 1228  Site Assessment Clean;Dry;Intact 02/18/19 1228  Line Status Flushed;Saline locked;Blood return noted 02/18/19 1228  Dressing Type Transparent;Securing device 02/18/19 1228  Dressing Status Clean;Dry;Intact;Antimicrobial disc in place 02/18/19 1228  Dressing Intervention New dressing 02/18/19 1228  Dressing Change Due 02/25/19 02/18/19 [place] 02/18/2019, 12:30 PM

## 2019-02-18 NOTE — Plan of Care (Signed)

## 2019-02-18 NOTE — Progress Notes (Signed)
Home health choices:  [place] [phone]  Add [facility] Quality of Patient Care Rating 1  out of 5 stars Not [place] [phone]  [place] my Favorites Quality of Patient Care Rating 4 out of 5 stars Patient Survey Summary Rating 4 out of 5 stars [place] [phone]  Add [facility] my Favorites Quality of Patient Care Rating 4 out of 5 stars Patient Survey Summary Rating 4 out of 5 stars [place] [phone]  [place] my Favorites Quality of Patient Care Rating 3 out of 5 stars Patient Survey Summary Rating 4 out of 5 stars [place] [phone]  [place] my Favorites Quality of Patient Care Rating 2 out of 5 stars Patient Survey Summary Rating 4 out of [place] [phone]  [place] my Favorites Quality of Patient Care Rating 3  out of 5 stars Patient Survey Summary Rating 3 out of [place] [phone]  [facility] Quality of Patient Care Rating 2 out of 5 stars Patient Survey Summary Rating 4 out of 5 stars [facility] [phone]  Add [facility] Quality of Patient Care Rating 2  out of 5 stars Patient Survey Summary Rating 3 out of 5 stars [place] [phone]  [place] my Favorites Quality of Patient Care Rating 2  out of 5 stars Patient Survey Summary Rating 4 out of 5 stars TEAM NURSE [phone]) [phone]  Add TEAM NURSEto my Favorites Quality of Patient Care Rating 1  out of 5 stars Not Available12 Viewing 1 - 10 of 10 results FirstPreviousNextLast  Choose up to 3 home  health agencies to compare.  So far you have none selected.

## 2019-02-19 ENCOUNTER — Ambulatory Visit (HOSPITAL_COMMUNITY): Payer: Medicare Other

## 2019-02-20 ENCOUNTER — Ambulatory Visit (HOSPITAL_COMMUNITY): Payer: Medicare Other

## 2019-02-21 ENCOUNTER — Ambulatory Visit (HOSPITAL_COMMUNITY): Payer: Medicare Other

## 2019-02-25 ENCOUNTER — Ambulatory Visit: Payer: Medicare Other | Admitting: Gastroenterology

## 2019-02-28 LAB — FUNGAL ANTIBODIES PANEL, ID-BLOOD
Aspergillus flavus: NEGATIVE
Aspergillus fumigatus, IgG: NEGATIVE
Aspergillus niger: NEGATIVE
Blastomyces Abs, Qn, DID: NEGATIVE

## 2019-03-04 ENCOUNTER — Inpatient Hospital Stay (HOSPITAL_COMMUNITY): Payer: Medicare Other | Attending: Hematology | Admitting: Hematology

## 2019-03-04 ENCOUNTER — Other Ambulatory Visit: Payer: Self-pay

## 2019-03-04 DIAGNOSIS — Z79899 Other long term (current) drug therapy: Secondary | ICD-10-CM | POA: Insufficient documentation

## 2019-03-04 DIAGNOSIS — Z87891 Personal history of nicotine dependence: Secondary | ICD-10-CM | POA: Insufficient documentation

## 2019-03-04 DIAGNOSIS — M25569 Pain in unspecified knee: Secondary | ICD-10-CM | POA: Insufficient documentation

## 2019-03-04 DIAGNOSIS — Z9221 Personal history of antineoplastic chemotherapy: Secondary | ICD-10-CM | POA: Insufficient documentation

## 2019-03-04 DIAGNOSIS — N183 Chronic kidney disease, stage 3 unspecified: Secondary | ICD-10-CM | POA: Insufficient documentation

## 2019-03-04 DIAGNOSIS — M549 Dorsalgia, unspecified: Secondary | ICD-10-CM | POA: Diagnosis not present

## 2019-03-04 DIAGNOSIS — Z791 Long term (current) use of non-steroidal anti-inflammatories (NSAID): Secondary | ICD-10-CM | POA: Diagnosis not present

## 2019-03-04 DIAGNOSIS — R5383 Other fatigue: Secondary | ICD-10-CM | POA: Diagnosis not present

## 2019-03-04 DIAGNOSIS — C9200 Acute myeloblastic leukemia, not having achieved remission: Secondary | ICD-10-CM | POA: Diagnosis present

## 2019-03-04 DIAGNOSIS — Z823 Family history of stroke: Secondary | ICD-10-CM | POA: Diagnosis not present

## 2019-03-04 DIAGNOSIS — Z8042 Family history of malignant neoplasm of prostate: Secondary | ICD-10-CM | POA: Diagnosis not present

## 2019-03-04 DIAGNOSIS — Z808 Family history of malignant neoplasm of other organs or systems: Secondary | ICD-10-CM | POA: Diagnosis not present

## 2019-03-04 DIAGNOSIS — D46Z Other myelodysplastic syndromes: Secondary | ICD-10-CM

## 2019-03-04 DIAGNOSIS — Z87442 Personal history of urinary calculi: Secondary | ICD-10-CM | POA: Insufficient documentation

## 2019-03-04 DIAGNOSIS — Z85820 Personal history of malignant melanoma of skin: Secondary | ICD-10-CM | POA: Insufficient documentation

## 2019-03-04 DIAGNOSIS — I10 Essential (primary) hypertension: Secondary | ICD-10-CM | POA: Insufficient documentation

## 2019-03-04 NOTE — Assessment & Plan Note (Signed)
1.  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  Flow cytometry-12% blasts.  Chromosome analysis and FISH panel normal. -4 cycles of azacitidine 50 mg per metered square from 08/22/2018 through 12/02/2018. -Repeat bone marrow biopsy on 12/23/2018 at [facility] showed 25% blasts and 90% cellular bone marrow, consistent with AML with MDS related changes.  Flow cytometry showed 11% blasts.  MDS FISH panel was normal. -NGS panel did not show any targetable mutations. - Cycle 1 of decitabine 20 mg per metered square on 01/20/2019. - He is currently taking venetoclax 300 mg daily.  Denies any major GI side effects other than minor nausea which is controlled with Compazine. -Admitted to the hospital from October 4 through October 12 for neutropenic fever.  Blood culture positive for Streptococcus Infantarius.. Completed treatment with ceftriaxone.  Port-A-Cath removed.  Now has a PICC line.   -Completed 1 cycle of decitabine and venetoclax. -Labs completed by [facility] yesterday showed white blood cell count of 5.3 with normal differential, hemoglobin 12.2, and platelet count of 276,000.  Lab results are reassuring for treatment response.   -Have recommended bone marrow biopsy at this time to assess disease status.  We will make recommendations based on these results.  2.  CKD: - Creatinine stable between 1.3-1.4.  3.  Back pain and knee pains: -He is taking tramadol 3 times a day along with Tylenol. -He had injections into his both knees and right ankle last week.

## 2019-03-04 NOTE — Progress Notes (Signed)
[place], [place] [address]   CLINIC:  Medical Oncology/Hematology  PCP:  [name], MD [address] [place] [address] [phone]   REASON FOR VISIT:  Follow-up for AML   CURRENT THERAPY: Decitabine and venetoclax  BRIEF ONCOLOGIC HISTORY:  Oncology History  MDS (myelodysplastic syndrome), high grade ([facility])  08/14/2018 Initial Diagnosis   MDS (myelodysplastic syndrome), high grade ([facility])   08/22/2018 - 12/31/2018 Chemotherapy   The patient had palonosetron (ALOXI) injection 0.25 mg, 0.25 mg, Intravenous,  Once, 4 of 6 cycles Administration: 0.25 mg (08/22/2018), 0.25 mg (08/26/2018), 0.25 mg (08/28/2018), 0.25 mg (08/30/2018), 0.25 mg (10/01/2018), 0.25 mg (10/02/2018), 0.25 mg (11/04/2018), 0.25 mg (09/23/2018), 0.25 mg (09/25/2018), 0.25 mg (09/27/2018), 0.25 mg (10/28/2018), 0.25 mg (10/30/2018), 0.25 mg (11/01/2018), 0.25 mg (12/02/2018), 0.25 mg (12/04/2018), 0.25 mg (12/06/2018) azaCITIDine (VIDAZA) 100 mg in sodium chloride 0.9 % 50 mL chemo infusion, 110 mg (66.7 % of original dose 75 mg/m2), Intravenous, Once, 4 of 6 cycles Dose modification: 50 mg/m2 (original dose 75 mg/m2, Cycle 1, Reason: Provider Judgment), 50 mg/m2 (original dose 75 mg/m2, Cycle 2, Reason: Provider Judgment) Administration: 100 mg (08/22/2018), 100 mg (08/23/2018), 100 mg (08/26/2018), 100 mg (08/27/2018), 100 mg (08/28/2018), 100 mg (08/29/2018), 100 mg (08/30/2018), 100 mg (10/01/2018), 100 mg (10/02/2018), 100 mg (11/04/2018), 100 mg (11/05/2018), 100 mg (09/23/2018), 100 mg (09/24/2018), 100 mg (09/25/2018), 100 mg (09/26/2018), 100 mg (09/27/2018), 100 mg (10/28/2018), 100 mg (10/29/2018), 100 mg (10/30/2018), 100 mg (10/31/2018), 100 mg (11/01/2018), 100 mg (12/02/2018), 100 mg (12/03/2018), 100 mg (12/04/2018), 100 mg (12/05/2018), 100 mg (12/06/2018)  for chemotherapy treatment.    01/20/2019 -  Chemotherapy   The patient had decitabine (DACOGEN) 45 mg in sodium chloride 0.9 % 50 mL chemo  infusion, 20 mg/m2 = 45 mg, Intravenous,  Once, 1 of 4 cycles Administration: 45 mg (01/20/2019), 45 mg (01/21/2019), 45 mg (01/22/2019), 45 mg (01/23/2019), 45 mg (01/24/2019) ondansetron (ZOFRAN) 8 mg in sodium chloride 0.9 % 50 mL IVPB, 8 mg (100 % of original dose 8 mg), Intravenous,  Once, 1 of 4 cycles Dose modification: 8 mg (original dose 8 mg, Cycle 1) Administration: 8 mg (01/20/2019), 8 mg (01/21/2019), 8 mg (01/22/2019), 8 mg (01/23/2019), 8 mg (01/24/2019)  for chemotherapy treatment.    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[facility])  01/07/2019 Initial Diagnosis   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[facility])   01/20/2019 -  Chemotherapy   The patient had decitabine (DACOGEN) 45 mg in sodium chloride 0.9 % 50 mL chemo infusion, 20 mg/m2 = 45 mg, Intravenous,  Once, 1 of 4 cycles Administration: 45 mg (01/20/2019), 45 mg (01/21/2019), 45 mg (01/22/2019), 45 mg (01/23/2019), 45 mg (01/24/2019) ondansetron (ZOFRAN) 8 mg in sodium chloride 0.9 % 50 mL IVPB, 8 mg (100 % of original dose 8 mg), Intravenous,  Once, 1 of 4 cycles Dose modification: 8 mg (original dose 8 mg, Cycle 1) Administration: 8 mg (01/20/2019), 8 mg (01/21/2019), 8 mg (01/22/2019), 8 mg (01/23/2019), 8 mg (01/24/2019)  for chemotherapy treatment.         INTERVAL HISTORY:  [name] 73 y.o. male Lib[place] Maw today for follow.  Patient was recently admitted to the hospital for neutropenic fevers on October 4 through October 12.  Blood culture from 10 4 revealed Streptococcus Infantarius.  He was started on ceftriaxone.  Port-A-Cath was also removed during his hospital stay due to continuation of neutropenic fevers.  CT of chest abdomen pelvis on 10 7  showed numerous bilateral pulmonary nodules measuring up to 14 mm in size.  He was discharged from the hospital once he was afebrile for greater than 24 hours.  He denies any fever since discharge.  He states he is continuing to feel better daily.  He finished prescription for ceftriaxone 3 days ago.  He  denies any cough.  Denies any night sweats or chills.  He is here for repeat labs and office visit.  His wife accompanies him.   REVIEW OF SYSTEMS:  Review of Systems  Constitutional: Positive for fatigue.  HENT:  Negative.   Eyes: Negative.   Respiratory: Negative.   Cardiovascular: Negative.   Gastrointestinal: Negative.   Endocrine: Negative.   Genitourinary: Negative.    Musculoskeletal: Positive for arthralgias, gait problem and myalgias.  Skin: Negative.   Neurological: Positive for gait problem.  Hematological: Negative.   Psychiatric/Behavioral: Negative.      PAST MEDICAL/SURGICAL HISTORY:  Past Medical History:  Diagnosis Date  . Arthritis   . 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 Cancer ([facility]) 01-28-2014   skin cancer  . CKD (chronic kidney disease), stage III   . GERD (gastroesophageal reflux disease)   . Heart murmur    NOTED DURING PHYSICAL WHEN HE WAS ENLISTING IN [facility] , DIDNT KNOW UNTIL THAT TIME AND REPORTS , "THATS THE LAST I HEARD ABOUT IT "   . History of hypertension    no longer issue  . History of kidney stones   . History of malignant melanoma of skin    excision top of scalp 2015-- no recurrence  . History of urinary retention    post op lumbar fusion surgery 04/ 2016  . Hypertension   . 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 Left ureteral calculus   . Seasonal allergies   . Wears glasses   . Wears glasses   . Wears partial dentures    upper and lower   Past Surgical History:  Procedure Laterality Date  . ANKLE FUSION Right 2007  . CARPAL TUNNEL RELEASE Left 12/28/2009   w/ pulley release left long finger  . CARPAL TUNNEL RELEASE Right 07/22/2013   Procedure: RIGHT CARPAL TUNNEL RELEASE;  Surgeon: Cammie Sickle., MD;  Location: [facility];  Service: Orthopedics;  Laterality: Right;  . COLONOSCOPY    . CYSTO/  LEFT RETROGRADE  PYELOGRAM  11/21/2010  . CYSTOSCOPY WITH STENT PLACEMENT Left 03/09/2016   Procedure: CYSTOSCOPY WITH STENT PLACEMENT;  Surgeon: Franchot Gallo, MD;  Location: [facility];  Service: Urology;  Laterality: Left;  . CYSTOSCOPY/RETROGRADE/URETEROSCOPY/STONE EXTRACTION WITH BASKET Left 03/09/2016   Procedure: CYSTOSCOPY/RETROGRADE/URETEROSCOPY/STONE EXTRACTION WITH BASKET;  Surgeon: Franchot Gallo, MD;  Location: [facility];  Service: Urology;  Laterality: Left;  . LEFT URETEROSCOPIC LASER LITHOTRIPSY STONE EXTRACTION/ STENT PLACEMENT  05/23/2010  . MOHS SURGERY     TOP OF THE HEAD   . ORIF ANKLE FRACTURE Right 1978  . PORT-A-CATH REMOVAL Right 02/14/2019   Procedure: MINOR REMOVAL PORT-A-CATH;  Surgeon: Aviva Signs, MD;  Location: [facility];  Service: General;  Laterality: Right;  . PORTACATH PLACEMENT Right 08/19/2018   Procedure: INSERTION PORT-A-CATH (attached catheter in right subclavian);  Surgeon: Aviva Signs, MD;  Location: [facility];  Service: General;  Laterality: Right;  . POSTERIOR LUMBAR FUSION  08/21/2014   laminectomy and decompression L2 -- L5  . RIGHT LOWER  LEG SURGERY  X3  1975 to 1976   including ORIF  . TONSILLECTOMY AND ADENOIDECTOMY  1986  . UMBILICAL HERNIA REPAIR  2009 approx     SOCIAL HISTORY:  Social History   Socioeconomic History  . Marital status: Married    Spouse name: Not on file  . Number of children: Not on file  . Years of education: Not on file  . Highest education level: Not on file  Occupational History  . Not on file  Social Needs  . Financial resource strain: Patient refused  . Food insecurity    Worry: Patient refused    Inability: Patient refused  . Transportation needs    Medical: Patient refused    Non-medical: Patient refused  Tobacco Use  . Smoking status: Former Smoker    Years: 20.00    Types: Cigarettes    Quit date: 07/17/1986    Years since quitting: 32.6  . Smokeless tobacco: Never Used   Substance and Sexual Activity  . Alcohol use: Yes    Alcohol/week: 7.0 - 14.0 standard drinks    Types: 7 - 14 Cans of beer per week    Comment: 1 -2 beer daily  . Drug use: No  . Sexual activity: Not Currently  Lifestyle  . Physical activity    Days per week: Patient refused    Minutes per session: Patient refused  . Stress: Patient refused  Relationships  . Social Herbalist on phone: Patient refused    Gets together: Patient refused    Attends religious service: Patient refused    Active member of club or organization: Patient refused    Attends meetings of clubs or organizations: Patient refused    Relationship status: Patient refused  . Intimate partner violence    Fear of current or ex partner: Patient refused    Emotionally abused: Patient refused    Physically abused: Patient refused    Forced sexual activity: Patient refused  Other Topics Concern  . Not on file  Social History Narrative  . Not on file    FAMILY HISTORY:  Family History  Problem Relation Age of Onset  . Stroke Mother   . Prostate cancer Father   . Bone cancer Father   . Diverticulitis Father   . Rheum arthritis Sister   . Urinary tract infection Sister   . Colon cancer Neg Hx     CURRENT MEDICATIONS:  Outpatient Encounter Medications as of 03/04/2019  Medication Sig Note  . allopurinol (ZYLOPRIM) 300 MG tablet Take 300 mg by mouth every evening.   . cetirizine (ZYRTEC) 10 MG tablet Take 10 mg by mouth daily. IN THE MORNING   . docusate sodium (COLACE) 100 MG capsule Take 100 mg by mouth at bedtime.    . magnesium oxide (MAG-OX) 400 MG tablet Take 400 mg by mouth at bedtime.    Marland Kitchen acetaminophen (TYLENOL) 500 MG tablet Take 1,000 mg by mouth every 6 (six) hours as needed for mild pain or moderate pain.    Marland Kitchen diclofenac sodium (VOLTAREN) 1 % GEL Apply 1 g topically 3 (three) times daily as needed (knee pain.).    Marland Kitchen magic mouthwash w/lidocaine SOLN Take 15 mLs by mouth 4 (four) times  daily as needed for mouth pain (Sore throat/dysphagia.). (Patient not taking: Reported on 03/04/2019)   . traMADol (ULTRAM) 50 MG tablet TAKE 1 TABLET BY MOUTH THREE TIMES DAILY AS NEEDED FOR PAIN (Patient not taking: Reported on 03/04/2019) 02/03/2019: Dewaine Conger  TID @ 8am ,3pm and 11pm with extra strength tylenol.   No facility-administered encounter medications on file as of 03/04/2019.     ALLERGIES:  No Known Allergies   PHYSICAL EXAM:  ECOG Performance status: 1  Vitals:   03/04/19 0843  BP: 134/83  Pulse: 88  Resp: 18  Temp: (!) 97.1 F (36.2 C)  SpO2: 97%   Filed Weights   03/04/19 0843  Weight: 217 lb 4.8 oz (98.6 kg)    Physical Exam HENT:     Head: Normocephalic.     Right Ear: External ear normal.     Left Ear: External ear normal.     Nose: Nose normal.     Mouth/Throat:     Mouth: Mucous membranes are moist.  Eyes:     Conjunctiva/sclera: Conjunctivae normal.  Cardiovascular:     Rate and Rhythm: Normal rate.     Pulses: Normal pulses.     Heart sounds: Normal heart sounds.  Pulmonary:     Effort: Pulmonary effort is normal.  Abdominal:     General: Bowel sounds are normal.  Musculoskeletal: Normal range of motion.        General: Deformity present.  Skin:    General: Skin is warm.  Neurological:     General: No focal deficit present.     Mental Status: He is alert and oriented to person, place, and time.  Psychiatric:        Mood and Affect: Mood normal.        Behavior: Behavior normal.        Thought Content: Thought content normal.        Judgment: Judgment normal.      LABORATORY DATA:  I have reviewed the labs as listed.  CBC    Component Value Date/Time   WBC 3.5 (L) 02/18/2019 0508   RBC 3.25 (L) 02/18/2019 0508   HGB 9.7 (L) 02/18/2019 0508   HCT 32.8 (L) 02/18/2019 0508   PLT 73 (L) 02/18/2019 0508   MCV 100.9 (H) 02/18/2019 0508   MCH 29.8 02/18/2019 0508   MCHC 29.6 (L) 02/18/2019 0508   RDW 17.6 (H) 02/18/2019 0508    LYMPHSABS 0.9 02/18/2019 0508   MONOABS 0.7 02/18/2019 0508   EOSABS 0.0 02/18/2019 0508   BASOSABS 0.1 02/18/2019 0508   CMP Latest Ref Rng & Units 02/18/2019 02/17/2019 02/15/2019  Glucose 70 - 99 mg/dL 100(H) 95 105(H)  BUN 8 - 23 mg/dL 20 22 27(H)  Creatinine 0.61 - 1.24 mg/dL 1.00 1.10 1.08  Sodium 135 - 145 mmol/L 140 140 138  Potassium 3.5 - 5.1 mmol/L 4.4 4.6 4.3  Chloride 98 - 111 mmol/L 104 103 103  CO2 22 - 32 mmol/L _0 Calcium 8.9 - 10.3 mg/dL 9.1 9.4 9.0  Total Protein 6.5 - 8.1 g/dL - - -  Total Bilirubin 0.3 - 1.2 mg/dL - - -  Alkaline Phos 38 - 126 U/L - - -  AST 15 - 41 U/L - - -  ALT 0 - 44 U/L - - -      ASSESSMENT & PLAN:   MDS (myelodysplastic syndrome), high grade ([facility]) 1.  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  Flow cytometry-12% blasts.  Chromosome analysis and FISH panel normal. -4 cycles of azacitidine 50 mg per metered square from 08/22/2018 through 12/02/2018. -Repeat bone marrow biopsy on 12/23/2018 at [facility] showed 25% blasts and 90% cellular bone marrow, consistent with  AML with MDS related changes.  Flow cytometry showed 11% blasts.  MDS FISH panel was normal. -NGS panel did not show any targetable mutations. - Cycle 1 of decitabine 20 mg per metered square on 01/20/2019. - He is currently taking venetoclax 300 mg daily.  Denies any major GI side effects other than minor nausea which is controlled with Compazine. -Admitted to the hospital from October 4 through October 12 for neutropenic fever.  Blood culture positive for Streptococcus Infantarius.. Completed treatment with ceftriaxone.  Port-A-Cath removed.  Now has a PICC line.   -Completed 1 cycle of decitabine and venetoclax. -Labs completed by [facility] yesterday showed white blood cell count of 5.3 with normal differential, hemoglobin 12.2, and platelet count of 276,000.  Lab results are reassuring for treatment response.   -Have recommended  bone marrow biopsy at this time to assess disease status.  We will make recommendations based on these results.  2.  CKD: - Creatinine stable between 1.3-1.4.  3.  Back pain and knee pains: -He is taking tramadol 3 times a day along with Tylenol. -He had injections into his both knees and right ankle last week.         [place] [phone]

## 2019-03-04 NOTE — Progress Notes (Signed)
8 staples removed from patients right chest without incidence. Patient tolerated procedure well.  No bandage required.

## 2019-03-05 ENCOUNTER — Encounter (HOSPITAL_COMMUNITY): Payer: Self-pay | Admitting: Hematology

## 2019-03-05 ENCOUNTER — Inpatient Hospital Stay (HOSPITAL_BASED_OUTPATIENT_CLINIC_OR_DEPARTMENT_OTHER): Payer: Medicare Other | Admitting: Hematology

## 2019-03-05 VITALS — BP 154/86 | HR 66 | Temp 97.1°F | Resp 18 | Wt 218.4 lb

## 2019-03-05 DIAGNOSIS — D46Z Other myelodysplastic syndromes: Secondary | ICD-10-CM | POA: Diagnosis not present

## 2019-03-05 DIAGNOSIS — C9200 Acute myeloblastic leukemia, not having achieved remission: Secondary | ICD-10-CM

## 2019-03-05 LAB — CBC WITH DIFFERENTIAL/PLATELET
Abs Immature Granulocytes: 0.03 10*3/uL (ref 0.00–0.07)
Basophils Absolute: 0.1 10*3/uL (ref 0.0–0.1)
Basophils Relative: 2 %
Eosinophils Absolute: 0 10*3/uL (ref 0.0–0.5)
Eosinophils Relative: 1 %
HCT: 41.2 % (ref 39.0–52.0)
Hemoglobin: 12.1 g/dL — ABNORMAL LOW (ref 13.0–17.0)
Immature Granulocytes: 1 %
Lymphocytes Relative: 28 %
Lymphs Abs: 1.3 10*3/uL (ref 0.7–4.0)
MCH: 30.6 pg (ref 26.0–34.0)
MCHC: 29.4 g/dL — ABNORMAL LOW (ref 30.0–36.0)
MCV: 104 fL — ABNORMAL HIGH (ref 80.0–100.0)
Monocytes Absolute: 0.8 10*3/uL (ref 0.1–1.0)
Monocytes Relative: 17 %
Neutro Abs: 2.5 10*3/uL (ref 1.7–7.7)
Neutrophils Relative %: 51 %
Platelets: 256 10*3/uL (ref 150–400)
RBC: 3.96 MIL/uL — ABNORMAL LOW (ref 4.22–5.81)
RDW: 17.8 % — ABNORMAL HIGH (ref 11.5–15.5)
WBC: 4.7 10*3/uL (ref 4.0–10.5)
nRBC: 0 % (ref 0.0–0.2)

## 2019-03-05 MED ORDER — SODIUM CHLORIDE 0.9% FLUSH
3.0000 mL | INTRAVENOUS | Status: DC | PRN
  Administered 2019-03-05: 3 mL via INTRAVENOUS
  Filled 2019-03-05: qty 10

## 2019-03-05 MED ORDER — HEPARIN SOD (PORK) LOCK FLUSH 100 UNIT/ML IV SOLN
250.0000 [IU] | Freq: Once | INTRAVENOUS | Status: AC
  Administered 2019-03-05: 250 [IU] via INTRAVENOUS

## 2019-03-05 NOTE — Progress Notes (Signed)
1005-Patients dressing checked with discharge.  Dressing clean, dry, and intact.  Patient denied pain.  Reviewed discharge instructions with all questions asked and answered.  Copy given for home.  Instructed the patient to call for any questions with understanding verbalized.  Discharged in stable condition with vital signs stable.

## 2019-03-05 NOTE — Patient Instructions (Signed)

## 2019-03-05 NOTE — Progress Notes (Signed)
Patient here today for bone marrow biopsy. Procedure explained and consent signed by all parties at [place]. Patient placed in prone position with both arms above head. Time out conducted at [phone] all parties agreed. Procedure started at 0930. Patient tolerated procedure well with minimal pain and discomfort. Specimens collected and labeled appropriately. Procedure completed at [phone]. Dressing applied and patient reposition on back, sitting up, resting at 0950. Specimens taken to lab for processing. Patient stable, report given to Charlyne Petrin RN, patient care transferred.

## 2019-03-06 NOTE — Progress Notes (Signed)
INDICATION:  Acute myeloid leukemia.  Bone Marrow Biopsy and Aspiration Procedure Note   The patient was identified by name and date of birth, prior to start of the procedure and a timeout was performed.   An informed consent was obtained after discussing potential risks including bleeding, infection and pain.  The left posterior iliac crest was palpated, cleaned with ChloraPrep, and drapes applied.  1% lidocaine is infiltrated into the skin, subcutaneous tissue and periosteum.  Bone marrow was aspirated and smears made.  With the help of Jamshidi needle a core biopsy was obtained.  Pressure was applied to the biopsy site and bandage was placed over the biopsy site. Patient was made to lie on the back for 15 mins prior to discharge.  The procedure was tolerated well. COMPLICATIONS: None BLOOD LOSS: none Patient was discharged home in stable condition to return in 2 weeks to review results.  Patient was provided with post bone marrow biopsy instructions and instructed to call if there was any bleeding or worsening pain.  Specimens sent for flow cytometry, cytogenetics and additional studies.  Signed [name], MD

## 2019-03-06 NOTE — Assessment & Plan Note (Signed)
1.  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  Flow cytometry-12% blasts.  Chromosome analysis and FISH panel normal. -4 cycles of Vidaza from 08/22/2018 through 12/02/2018. -Repeat bone marrow biopsy on 12/23/2018 at [facility] showed 25% blasts and 90% cellular marrow, consistent with AML with MDS related changes.  Flow cytometry showed 11% blasts.  MDS FISH panel was normal. -NGS panel did not show any targetable mutations. -Cycle 1 of decitabine (20 mg/m2) with venetoclax 300 mg daily on 01/20/2019. -He was hospitalized from 02/09/2019 through 02/18/2019 with neutropenic fever.  His venetoclax was held at that time. -His blood cultures grew Streptococcus Infantarius.  His Port-A-Cath was removed on 02/14/2019.  He also had lung infiltrates with cough which resolved.  He had a PICC line placed in the left arm. -He reportedly had labs done in [place] which showed normal platelet count, hemoglobin and white count. -As he was hospitalized, bone marrow biopsy to check for remission was delayed.  I have discussed with him the need for the bone marrow biopsy.  We will proceed with it today. -We have also obtained a CBC today which showed white count of 4.7, hemoglobin 12.1 lately count 256.  Differential was normal.  2.  CKD: -Creatinine stable between 1.3-1.4.  3.  Back pain and knee pains: -He takes tramadol 3 times a day along with Tylenol. -He also had injections in both knees and ankle.

## 2019-03-06 NOTE — Progress Notes (Signed)
[facility] [address] [place] [address]   CLINIC:  Medical Oncology/Hematology  PCP:  [name], MD [place] [address] [phone]   REASON FOR VISIT:  Follow-up for newly diagnosed AML.  CURRENT THERAPY: Decitabine and venetoclax  BRIEF ONCOLOGIC HISTORY:  Oncology History  MDS (myelodysplastic syndrome), high grade (HCC)  08/14/2018 Initial Diagnosis   MDS (myelodysplastic syndrome), high grade ([place])   08/22/2018 - 12/31/2018 Chemotherapy   The patient had palonosetron (ALOXI) injection 0.25 mg, 0.25 mg, Intravenous,  Once, 4 of 6 cycles Administration: 0.25 mg (08/22/2018), 0.25 mg (08/26/2018), 0.25 mg (08/28/2018), 0.25 mg (08/30/2018), 0.25 mg (10/01/2018), 0.25 mg (10/02/2018), 0.25 mg (11/04/2018), 0.25 mg (09/23/2018), 0.25 mg (09/25/2018), 0.25 mg (09/27/2018), 0.25 mg (10/28/2018), 0.25 mg (10/30/2018), 0.25 mg (11/01/2018), 0.25 mg (12/02/2018), 0.25 mg (12/04/2018), 0.25 mg (12/06/2018) azaCITIDine (VIDAZA) 100 mg in sodium chloride 0.9 % 50 mL chemo infusion, 110 mg (66.7 % of original dose 75 mg/m2), Intravenous, Once, 4 of 6 cycles Dose modification: 50 mg/m2 (original dose 75 mg/m2, Cycle 1, Reason: Provider Judgment), 50 mg/m2 (original dose 75 mg/m2, Cycle 2, Reason: Provider Judgment) Administration: 100 mg (08/22/2018), 100 mg (08/23/2018), 100 mg (08/26/2018), 100 mg (08/27/2018), 100 mg (08/28/2018), 100 mg (08/29/2018), 100 mg (08/30/2018), 100 mg (10/01/2018), 100 mg (10/02/2018), 100 mg (11/04/2018), 100 mg (11/05/2018), 100 mg (09/23/2018), 100 mg (09/24/2018), 100 mg (09/25/2018), 100 mg (09/26/2018), 100 mg (09/27/2018), 100 mg (10/28/2018), 100 mg (10/29/2018), 100 mg (10/30/2018), 100 mg (10/31/2018), 100 mg (11/01/2018), 100 mg (12/02/2018), 100 mg (12/03/2018), 100 mg (12/04/2018), 100 mg (12/05/2018), 100 mg (12/06/2018)  for chemotherapy treatment.    01/20/2019 -  Chemotherapy   The patient had decitabine (DACOGEN) 45 mg in sodium chloride 0.9 %  50 mL chemo infusion, 20 mg/m2 = 45 mg, Intravenous,  Once, 1 of 4 cycles Administration: 45 mg (01/20/2019), 45 mg (01/21/2019), 45 mg (01/22/2019), 45 mg (01/23/2019), 45 mg (01/24/2019) ondansetron (ZOFRAN) 8 mg in sodium chloride 0.9 % 50 mL IVPB, 8 mg (100 % of original dose 8 mg), Intravenous,  Once, 1 of 4 cycles Dose modification: 8 mg (original dose 8 mg, Cycle 1) Administration: 8 mg (01/20/2019), 8 mg (01/21/2019), 8 mg (01/22/2019), 8 mg (01/23/2019), 8 mg (01/24/2019)  for chemotherapy treatment.    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[place])  01/07/2019 Initial Diagnosis   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[place])   01/20/2019 -  Chemotherapy   The patient had decitabine (DACOGEN) 45 mg in sodium chloride 0.9 % 50 mL chemo infusion, 20 mg/m2 = 45 mg, Intravenous,  Once, 1 of 4 cycles Administration: 45 mg (01/20/2019), 45 mg (01/21/2019), 45 mg (01/22/2019), 45 mg (01/23/2019), 45 mg (01/24/2019) ondansetron (ZOFRAN) 8 mg in sodium chloride 0.9 % 50 mL IVPB, 8 mg (100 % of original dose 8 mg), Intravenous,  Once, 1 of 4 cycles Dose modification: 8 mg (original dose 8 mg, Cycle 1) Administration: 8 mg (01/20/2019), 8 mg (01/21/2019), 8 mg (01/22/2019), 8 mg (01/23/2019), 8 mg (01/24/2019)  for chemotherapy treatment.       CANCER STAGING: Cancer Staging No matching staging information was found for the patient.   INTERVAL HISTORY:  [name] 73 y.o. male seen for follow-up of AML.  He was hospitalized from 02/09/2019 through 02/18/2019 with neutropenic fever.  Blood cultures grew Streptococcus Infantarius.  Port-A-Cath was removed.  He has a PICC line in place in the left arm.  He has been afebrile since discharge.  Denies any mucositis symptoms.  He has mild fatigue and arthralgias in his knees and back.  Appetite is 75%.  Energy levels are 25%.  He reportedly had labs drawn in [place] on Monday.  Denies any cough or expectoration.  REVIEW OF SYSTEMS:  Review of Systems  Constitutional: Positive  for fatigue.  Musculoskeletal: Positive for arthralgias.  All other systems reviewed and are negative.    PAST MEDICAL/SURGICAL HISTORY:  Past Medical History:  Diagnosis Date  . Arthritis   . 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 Cancer ([place]) 01-28-2014   skin cancer  . CKD (chronic kidney disease), stage III   . GERD (gastroesophageal reflux disease)   . Heart murmur    NOTED DURING PHYSICAL WHEN HE WAS ENLISTING IN [facility] , DIDNT KNOW UNTIL THAT TIME AND REPORTS , "THATS THE LAST I HEARD ABOUT IT "   . History of hypertension    no longer issue  . History of kidney stones   . History of malignant melanoma of skin    excision top of scalp 2015-- no recurrence  . History of urinary retention    post op lumbar fusion surgery 04/ 2016  . Hypertension   . 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 Left ureteral calculus   . Seasonal allergies   . Wears glasses   . Wears glasses   . Wears partial dentures    upper and lower   Past Surgical History:  Procedure Laterality Date  . ANKLE FUSION Right 2007  . CARPAL TUNNEL RELEASE Left 12/28/2009   w/ pulley release left long finger  . CARPAL TUNNEL RELEASE Right 07/22/2013   Procedure: RIGHT CARPAL TUNNEL RELEASE;  Surgeon: Cammie Sickle., MD;  Location: [facility];  Service: Orthopedics;  Laterality: Right;  . COLONOSCOPY    . CYSTO/  LEFT RETROGRADE PYELOGRAM  11/21/2010  . CYSTOSCOPY WITH STENT PLACEMENT Left 03/09/2016   Procedure: CYSTOSCOPY WITH STENT PLACEMENT;  Surgeon: Franchot Gallo, MD;  Location[facility];  Service: Urology;  Laterality: Left;  . CYSTOSCOPY/RETROGRADE/URETEROSCOPY/STONE EXTRACTION WITH BASKET Left 03/09/2016   Procedure: CYSTOSCOPY/RETROGRADE/URETEROSCOPY/STONE EXTRACTION WITH BASKET;  Surgeon: Franchot Gallo, MD;  Location: [facility];  Service: Urology;   Laterality: Left;  . LEFT URETEROSCOPIC LASER LITHOTRIPSY STONE EXTRACTION/ STENT PLACEMENT  05/23/2010  . MOHS SURGERY     TOP OF THE HEAD   . ORIF ANKLE FRACTURE Right 1978  . PORT-A-CATH REMOVAL Right 02/14/2019   Procedure: MINOR REMOVAL PORT-A-CATH;  Surgeon: Aviva Signs, MD;  Location: [facility];  Service: General;  Laterality: Right;  . PORTACATH PLACEMENT Right 08/19/2018   Procedure: INSERTION PORT-A-CATH (attached catheter in right subclavian);  Surgeon: Aviva Signs, MD;  Location: [facility];  Service: General;  Laterality: Right;  . POSTERIOR LUMBAR FUSION  08/21/2014   laminectomy and decompression L2 -- L5  . RIGHT LOWER LEG SURGERY  X3  1975 to 1976   including ORIF  . TONSILLECTOMY AND ADENOIDECTOMY  1986  . UMBILICAL HERNIA REPAIR  2009 approx     SOCIAL HISTORY:  Social History   Socioeconomic History  . Marital status: Married    Spouse name: Not on file  . Number of children: Not on file  . Years of education: Not on file  . Highest education level: Not on file  Occupational History  . Not on file  Social Needs  . Financial resource strain: Patient refused  . Food insecurity    Worry: Patient refused    Inability: Patient refused  . Transportation needs    Medical: Patient refused    Non-medical: Patient refused  Tobacco Use  . Smoking status: Former Smoker    Years: 20.00    Types: Cigarettes    Quit date: 07/17/1986    Years since quitting: 32.6  . Smokeless tobacco: Never Used  Substance and Sexual Activity  . Alcohol use: Yes    Alcohol/week: 7.0 - 14.0 standard drinks    Types: 7 - 14 Cans of beer per week    Comment: 1 -2 beer daily  . Drug use: No  . Sexual activity: Not Currently  Lifestyle  . Physical activity    Days per week: Patient refused    Minutes per session: Patient refused  . Stress: Patient refused  Relationships  . Social Herbalist on phone: Patient refused    Gets together: Patient refused    Attends religious  service: Patient refused    Active member of club or organization: Patient refused    Attends meetings of clubs or organizations: Patient refused    Relationship status: Patient refused  . Intimate partner violence    Fear of current or ex partner: Patient refused    Emotionally abused: Patient refused    Physically abused: Patient refused    Forced sexual activity: Patient refused  Other Topics Concern  . Not on file  Social History Narrative  . Not on file    FAMILY HISTORY:  Family History  Problem Relation Age of Onset  . Stroke Mother   . Prostate cancer Father   . Bone cancer Father   . Diverticulitis Father   . Rheum arthritis Sister   . Urinary tract infection Sister   . Colon cancer Neg Hx     CURRENT MEDICATIONS:  Outpatient Encounter Medications as of 03/05/2019  Medication Sig Note  . acetaminophen (TYLENOL) 500 MG tablet Take 1,000 mg by mouth every 6 (six) hours as needed for mild pain or moderate pain.    Marland Kitchen allopurinol (ZYLOPRIM) 300 MG tablet Take 300 mg by mouth every evening.   . cetirizine (ZYRTEC) 10 MG tablet Take 10 mg by mouth daily. IN THE MORNING   . diclofenac sodium (VOLTAREN) 1 % GEL Apply 1 g topically 3 (three) times daily as needed (knee pain.).    Marland Kitchen docusate sodium (COLACE) 100 MG capsule Take 100 mg by mouth at bedtime.    . lidocaine-prilocaine (EMLA) cream APPLY A PEA SIZED AMOUNT TO PORT A CATH SITE AND COVER WITH PLASTIC WRAP ONE HOUR PRIOR TO CHEMOTHERAPY APPOINTMENT   . magic mouthwash w/lidocaine SOLN Take 15 mLs by mouth 4 (four) times daily as needed for mouth pain (Sore throat/dysphagia.). (Patient not taking: Reported on 03/04/2019)   . magnesium oxide (MAG-OX) 400 MG tablet Take 400 mg by mouth at bedtime.    . tamsulosin (FLOMAX) 0.4 MG CAPS capsule Take 0.4 mg by mouth daily.   . traMADol (ULTRAM) 50 MG tablet TAKE 1 TABLET BY MOUTH THREE TIMES DAILY AS NEEDED FOR PAIN (Patient not taking: Reported on 03/04/2019) 02/03/2019: Takes  TID @ 8am ,3pm and 11pm with extra strength tylenol.   Facility-Administered Encounter Medications as of 03/05/2019  Medication  . [COMPLETED] heparin lock flush 100 unit/mL  . sodium chloride flush (NS) 0.9 % injection 3 mL    ALLERGIES:  No Known Allergies   PHYSICAL EXAM:  ECOG Performance status: 1  Vitals:   03/05/19 0803 03/05/19 1001  BP: (!) 134/97 (!) 154/86  Pulse: 67 66  Resp: 18 18  Temp: (!) 97.1 F (36.2 C) (!) 97.1 F (36.2 C)  SpO2: 97% 98%   Filed Weights   03/05/19 0803  Weight: 218 lb 6.4 oz (99.1 kg)    Physical Exam Constitutional:      Appearance: Normal appearance.  HENT:     Mouth/Throat:     Mouth: Mucous membranes are moist.     Pharynx: Oropharynx is clear.  Eyes:     Extraocular Movements: Extraocular movements intact.     Conjunctiva/sclera: Conjunctivae normal.  Neck:     Musculoskeletal: Normal range of motion.  Cardiovascular:     Rate and Rhythm: Normal rate and regular rhythm.     Heart sounds: Normal heart sounds.  Pulmonary:     Effort: Pulmonary effort is normal.     Breath sounds: Normal breath sounds.  Abdominal:     General: There is no distension.     Palpations: Abdomen is soft. There is no mass.  Musculoskeletal: Normal range of motion.        General: No deformity.  Skin:    General: Skin is warm.  Neurological:     General: No focal deficit present.     Mental Status: He is alert and oriented to person, place, and time.  Psychiatric:        Mood and Affect: Mood normal.        Behavior: Behavior normal.      LABORATORY DATA:  I have reviewed the labs as listed.  CBC    Component Value Date/Time   WBC 4.7 03/05/2019 0809   RBC 3.96 (L) 03/05/2019 0809   HGB 12.1 (L) 03/05/2019 0809   HCT 41.2 03/05/2019 0809   PLT 256 03/05/2019 0809   MCV 104.0 (H) 03/05/2019 0809   MCH 30.6 03/05/2019 0809   MCHC 29.4 (L) 03/05/2019 0809   RDW 17.8 (H) 03/05/2019 0809   LYMPHSABS 1.3 03/05/2019 0809   MONOABS  0.8 03/05/2019 0809   EOSABS 0.0 03/05/2019 0809   BASOSABS 0.1 03/05/2019 0809   CMP Latest Ref Rng & Units 02/18/2019 02/17/2019 02/15/2019  Glucose 70 - 99 mg/dL 100(H) 95 105(H)  BUN 8 - 23 mg/dL 20 22 27(H)  Creatinine 0.61 - 1.24 mg/dL 1.00 1.10 1.08  Sodium 135 - 145 mmol/L 140 140 138  Potassium 3.5 - 5.1 mmol/L 4.4 4.6 4.3  Chloride 98 - 111 mmol/L 104 103 103  CO2 22 - 32 mmol/L '25 26 27  ' Calcium 8.9 - 10.3 mg/dL 9.1 9.4 9.0  Total Protein 6.5 - 8.1 g/dL - - -  Total Bilirubin 0.3 - 1.2 mg/dL - - -  Alkaline Phos 38 - 126 U/L - - -  AST 15 - 41 U/L - - -  ALT 0 - 44 U/L - - -        ASSESSMENT & PLAN:   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HCC) 1.  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  Flow cytometry-12% blasts.  Chromosome analysis and FISH panel normal. -4 cycles of Vidaza from 08/22/2018 through 12/02/2018. -Repeat bone marrow biopsy on 12/23/2018 at [facility] showed 25% blasts and 90% cellular marrow, consistent with AML with MDS related changes.  Flow cytometry showed 11% blasts.  MDS FISH panel was normal. -NGS panel did not  show any targetable mutations. -Cycle 1 of decitabine (20 mg/m2) with venetoclax 300 mg daily on 01/20/2019. -He was hospitalized from 02/09/2019 through 02/18/2019 with neutropenic fever.  His venetoclax was held at that time. -His blood cultures grew Streptococcus Infantarius.  His Port-A-Cath was removed on 02/14/2019.  He also had lung infiltrates with cough which resolved.  He had a PICC line placed in the left arm. -He reportedly had labs done in [place] which showed normal platelet count, hemoglobin and white count. -As he was hospitalized, bone marrow biopsy to check for remission was delayed.  I have discussed with him the need for the bone marrow biopsy.  We will proceed with it today. -We have also obtained a CBC today which showed white count of 4.7, hemoglobin 12.1 lately count 256.  Differential was  normal.  2.  CKD: -Creatinine stable between 1.3-1.4.  3.  Back pain and knee pains: -He takes tramadol 3 times a day along with Tylenol. -He also had injections in both knees and ankle.   Total time spent is 25 minutes with more than 50% of the time spent face-to-face discussing treatment plan, counseling and coordination of care.  Orders placed this encounter:  No orders of the defined types were placed in this encounter.     [name], MD [place] [phone]

## 2019-03-07 LAB — SURGICAL PATHOLOGY

## 2019-03-11 ENCOUNTER — Other Ambulatory Visit (HOSPITAL_COMMUNITY): Payer: Self-pay | Admitting: Pharmacist

## 2019-03-11 ENCOUNTER — Encounter (HOSPITAL_COMMUNITY): Payer: Self-pay | Admitting: Hematology

## 2019-03-11 DIAGNOSIS — C9200 Acute myeloblastic leukemia, not having achieved remission: Secondary | ICD-10-CM

## 2019-03-11 MED ORDER — VENETOCLAX 100 MG PO TABS: 200.0000 mg | ORAL_TABLET | Freq: Every day | ORAL | 0 refills | Status: DC

## 2019-03-12 ENCOUNTER — Encounter (HOSPITAL_COMMUNITY): Payer: Self-pay | Admitting: Hematology

## 2019-03-17 ENCOUNTER — Other Ambulatory Visit (HOSPITAL_COMMUNITY): Payer: Self-pay | Admitting: Nurse Practitioner

## 2019-03-17 ENCOUNTER — Inpatient Hospital Stay (HOSPITAL_COMMUNITY): Payer: Medicare Other | Attending: Hematology | Admitting: Hematology

## 2019-03-17 ENCOUNTER — Encounter (HOSPITAL_COMMUNITY): Payer: Self-pay | Admitting: Hematology

## 2019-03-17 ENCOUNTER — Other Ambulatory Visit: Payer: Self-pay

## 2019-03-17 ENCOUNTER — Inpatient Hospital Stay (HOSPITAL_COMMUNITY): Payer: Medicare Other

## 2019-03-17 VITALS — BP 147/88 | HR 72 | Temp 98.8°F | Resp 18

## 2019-03-17 DIAGNOSIS — N189 Chronic kidney disease, unspecified: Secondary | ICD-10-CM | POA: Diagnosis not present

## 2019-03-17 DIAGNOSIS — D469 Myelodysplastic syndrome, unspecified: Secondary | ICD-10-CM | POA: Insufficient documentation

## 2019-03-17 DIAGNOSIS — C9200 Acute myeloblastic leukemia, not having achieved remission: Secondary | ICD-10-CM

## 2019-03-17 DIAGNOSIS — M25569 Pain in unspecified knee: Secondary | ICD-10-CM | POA: Diagnosis not present

## 2019-03-17 DIAGNOSIS — I129 Hypertensive chronic kidney disease with stage 1 through stage 4 chronic kidney disease, or unspecified chronic kidney disease: Secondary | ICD-10-CM | POA: Insufficient documentation

## 2019-03-17 DIAGNOSIS — M549 Dorsalgia, unspecified: Secondary | ICD-10-CM | POA: Insufficient documentation

## 2019-03-17 DIAGNOSIS — D61818 Other pancytopenia: Secondary | ICD-10-CM

## 2019-03-17 DIAGNOSIS — Z95828 Presence of other vascular implants and grafts: Secondary | ICD-10-CM

## 2019-03-17 DIAGNOSIS — Z452 Encounter for adjustment and management of vascular access device: Secondary | ICD-10-CM | POA: Diagnosis not present

## 2019-03-17 DIAGNOSIS — Z5111 Encounter for antineoplastic chemotherapy: Secondary | ICD-10-CM | POA: Diagnosis not present

## 2019-03-17 DIAGNOSIS — F17210 Nicotine dependence, cigarettes, uncomplicated: Secondary | ICD-10-CM | POA: Diagnosis not present

## 2019-03-17 DIAGNOSIS — D46Z Other myelodysplastic syndromes: Secondary | ICD-10-CM

## 2019-03-17 DIAGNOSIS — M25559 Pain in unspecified hip: Secondary | ICD-10-CM

## 2019-03-17 DIAGNOSIS — Z23 Encounter for immunization: Secondary | ICD-10-CM | POA: Diagnosis not present

## 2019-03-17 DIAGNOSIS — Z79899 Other long term (current) drug therapy: Secondary | ICD-10-CM | POA: Diagnosis not present

## 2019-03-17 LAB — URIC ACID: Uric Acid, Serum: 4.3 mg/dL (ref 3.7–8.6)

## 2019-03-17 LAB — COMPREHENSIVE METABOLIC PANEL
ALT: 20 U/L (ref 0–44)
AST: 22 U/L (ref 15–41)
Albumin: 3.6 g/dL (ref 3.5–5.0)
Alkaline Phosphatase: 60 U/L (ref 38–126)
Anion gap: 7 (ref 5–15)
BUN: 19 mg/dL (ref 8–23)
CO2: 27 mmol/L (ref 22–32)
Calcium: 9.4 mg/dL (ref 8.9–10.3)
Chloride: 106 mmol/L (ref 98–111)
Creatinine, Ser: 1.03 mg/dL (ref 0.61–1.24)
GFR calc Af Amer: >60 mL/min (ref >60)
GFR calc non Af Amer: >60 mL/min (ref >60)
Glucose, Bld: 119 mg/dL — ABNORMAL HIGH (ref 70–99)
Potassium: 4.1 mmol/L (ref 3.5–5.1)
Sodium: 140 mmol/L (ref 135–145)
Total Bilirubin: 0.7 mg/dL (ref 0.3–1.2)
Total Protein: 6.5 g/dL (ref 6.5–8.1)

## 2019-03-17 LAB — CBC WITH DIFFERENTIAL/PLATELET
Abs Immature Granulocytes: 0.04 10*3/uL (ref 0.00–0.07)
Basophils Absolute: 0.1 10*3/uL (ref 0.0–0.1)
Basophils Relative: 1 %
Eosinophils Absolute: 0.2 10*3/uL (ref 0.0–0.5)
Eosinophils Relative: 3 %
HCT: 42.3 % (ref 39.0–52.0)
Hemoglobin: 13.1 g/dL (ref 13.0–17.0)
Immature Granulocytes: 1 %
Lymphocytes Relative: 19 %
Lymphs Abs: 1.4 10*3/uL (ref 0.7–4.0)
MCH: 31.3 pg (ref 26.0–34.0)
MCHC: 31 g/dL (ref 30.0–36.0)
MCV: 101 fL — ABNORMAL HIGH (ref 80.0–100.0)
Monocytes Absolute: 0.8 10*3/uL (ref 0.1–1.0)
Monocytes Relative: 11 %
Neutro Abs: 4.5 10*3/uL (ref 1.7–7.7)
Neutrophils Relative %: 65 %
Platelets: 184 10*3/uL (ref 150–400)
RBC: 4.19 MIL/uL — ABNORMAL LOW (ref 4.22–5.81)
RDW: 16.2 % — ABNORMAL HIGH (ref 11.5–15.5)
WBC: 7 10*3/uL (ref 4.0–10.5)
nRBC: 0 % (ref 0.0–0.2)

## 2019-03-17 LAB — MAGNESIUM: Magnesium: 1.7 mg/dL (ref 1.7–2.4)

## 2019-03-17 LAB — PHOSPHORUS: Phosphorus: 3.1 mg/dL (ref 2.5–4.6)

## 2019-03-17 MED ORDER — SODIUM CHLORIDE 0.9 % IV SOLN
15.0000 mg/m2 | Freq: Once | INTRAVENOUS | Status: AC
  Administered 2019-03-17: 35 mg via INTRAVENOUS
  Filled 2019-03-17: qty 7

## 2019-03-17 MED ORDER — SODIUM CHLORIDE 0.9% FLUSH
3.0000 mL | INTRAVENOUS | Status: DC | PRN
  Administered 2019-03-17: 3 mL
  Filled 2019-03-17: qty 10

## 2019-03-17 MED ORDER — INFLUENZA VAC A&B SA ADJ QUAD 0.5 ML IM PRSY
0.5000 mL | PREFILLED_SYRINGE | Freq: Once | INTRAMUSCULAR | Status: AC
  Administered 2019-03-17: 0.5 mL via INTRAMUSCULAR
  Filled 2019-03-17: qty 0.5

## 2019-03-17 MED ORDER — HEPARIN SOD (PORK) LOCK FLUSH 100 UNIT/ML IV SOLN
250.0000 [IU] | Freq: Once | INTRAVENOUS | Status: AC | PRN
  Administered 2019-03-17: 250 [IU]

## 2019-03-17 MED ORDER — HEPARIN SOD (PORK) LOCK FLUSH 100 UNIT/ML IV SOLN: 500.0000 [IU] | Freq: Once | INTRAVENOUS | Status: DC | PRN

## 2019-03-17 MED ORDER — SODIUM CHLORIDE 0.9% FLUSH
10.0000 mL | INTRAVENOUS | Status: DC | PRN
  Filled 2019-03-17: qty 10

## 2019-03-17 MED ORDER — SODIUM CHLORIDE 0.9 % IV SOLN
8.0000 mg | Freq: Once | INTRAVENOUS | Status: AC
  Administered 2019-03-17: 8 mg via INTRAVENOUS
  Filled 2019-03-17: qty 4

## 2019-03-17 MED ORDER — SODIUM CHLORIDE 0.9 % IV SOLN
Freq: Once | INTRAVENOUS | Status: AC
  Administered 2019-03-17: 12:00:00 via INTRAVENOUS

## 2019-03-17 NOTE — Patient Instructions (Addendum)
[facility] Discharge Instructions for Patients Receiving Chemotherapy   Beginning January 23rd 2017 lab work for the [facility] will be done in the  Main lab at [facility] on 1st floor. If you have a lab appointment with the [facility] please come in thru the  Main Entrance and check in at the main information desk   Today you received the following chemotherapy agents Decitabine as well as Influenza vaccine. Follow-up as scheduled. Call clinic for any questions or concerns  To help prevent nausea and vomiting after your treatment, we encourage you to take your nausea medication   If you develop nausea and vomiting, or diarrhea that is not controlled by your medication, call the clinic.  The clinic phone number is [phone]. Office hours are Monday-Friday 8:30am-5:00pm.  BELOW ARE SYMPTOMS THAT SHOULD BE REPORTED IMMEDIATELY:  *FEVER GREATER THAN 101.0 F  *CHILLS WITH OR WITHOUT FEVER  NAUSEA AND VOMITING THAT IS NOT CONTROLLED WITH YOUR NAUSEA MEDICATION  *UNUSUAL SHORTNESS OF BREATH  *UNUSUAL BRUISING OR BLEEDING  TENDERNESS IN MOUTH AND THROAT WITH OR WITHOUT PRESENCE OF ULCERS  *URINARY PROBLEMS  *BOWEL PROBLEMS  UNUSUAL RASH Items with * indicate a potential emergency and should be followed up as soon as possible. If you have an emergency after office hours please contact your primary care physician or go to the nearest emergency department.  Please call the clinic during office hours if you have any questions or concerns.   You may also contact the Patient Navigator at [phone] should you have any questions or need assistance in obtaining follow up care.      Resources For Cancer Patients and their Caregivers ? American Cancer Society: Can assist with transportation, wigs, general needs, runs Look Good Feel Better.        ([phone] ? Cancer Care: Provides financial assistance, online support groups, medication/co-pay  assistance.  1-800-813-HOPE 617-181-9511) ? [place] Assists [place] cancer patients and their families through emotional , educational and financial support.  (407)030-3548 ? [place] DSS Where to apply for food stamps, Medicaid and utility assistance. (867) 556-8447 ? [facility]: Transportation to medical appointments. 812-234-7125 ? Social Security Administration: May apply for disability if have a Stage IV cancer. 8782500968 (346) 597-6136 ? [employer], Disability and Transit Services: Assists with nutrition, care and transit needs. [phone]

## 2019-03-17 NOTE — Patient Instructions (Signed)
Cancer Center at [facility] Discharge Instructions  Labs drawn from PICC line today   Thank you for choosing  Cancer Center at [facility] to provide your oncology and hematology care.  To afford each patient quality time with our provider, please arrive at least 15 minutes before your scheduled appointment time.   If you have a lab appointment with the Cancer Center please come in thru the Main Entrance and check in at the main information desk.  You need to re-schedule your appointment should you arrive 10 or more minutes late.  We strive to give you quality time with our providers, and arriving late affects you and other patients whose appointments are after yours.  Also, if you no show three or more times for appointments you may be dismissed from the clinic at the providers discretion.     Again, thank you for choosing [facility].  Our hope is that these requests will decrease the amount of time that you wait before being seen by our physicians.       _____________________________________________________________  Should you have questions after your visit to  Cancer Center, please contact our office at [phone] between the hours of 8:00 a.m. and 4:30 p.m.  Voicemails left after 4:00 p.m. will not be returned until the following business day.  For prescription refill requests, have your pharmacy contact our office and allow 72 hours.    Due to Covid, you will need to wear a mask upon entering the hospital. If you do not have a mask, a mask will be given to you at the Main Entrance upon arrival. For doctor visits, patients may have 1 support person with them. For treatment visits, patients can not have anyone with them due to social distancing guidelines and our immunocompromised population.     

## 2019-03-17 NOTE — Progress Notes (Signed)
[facility] [address], [place] [address]   CLINIC:  Medical Oncology/Hematology  PCP:  [name], MD [place] [address] [phone]   REASON FOR VISIT:  Follow-up for newly diagnosed AML.  CURRENT THERAPY: Decitabine and venetoclax  BRIEF ONCOLOGIC HISTORY:  Oncology History  MDS (myelodysplastic syndrome), high grade (HCC)  08/14/2018 Initial Diagnosis   MDS (myelodysplastic syndrome), high grade ([place])   08/22/2018 - 12/31/2018 Chemotherapy   The patient had palonosetron (ALOXI) injection 0.25 mg, 0.25 mg, Intravenous,  Once, 4 of 6 cycles Administration: 0.25 mg (08/22/2018), 0.25 mg (08/26/2018), 0.25 mg (08/28/2018), 0.25 mg (08/30/2018), 0.25 mg (10/01/2018), 0.25 mg (10/02/2018), 0.25 mg (11/04/2018), 0.25 mg (09/23/2018), 0.25 mg (09/25/2018), 0.25 mg (09/27/2018), 0.25 mg (10/28/2018), 0.25 mg (10/30/2018), 0.25 mg (11/01/2018), 0.25 mg (12/02/2018), 0.25 mg (12/04/2018), 0.25 mg (12/06/2018) azaCITIDine (VIDAZA) 100 mg in sodium chloride 0.9 % 50 mL chemo infusion, 110 mg (66.7 % of original dose 75 mg/m2), Intravenous, Once, 4 of 6 cycles Dose modification: 50 mg/m2 (original dose 75 mg/m2, Cycle 1, Reason: Provider Judgment), 50 mg/m2 (original dose 75 mg/m2, Cycle 2, Reason: Provider Judgment) Administration: 100 mg (08/22/2018), 100 mg (08/23/2018), 100 mg (08/26/2018), 100 mg (08/27/2018), 100 mg (08/28/2018), 100 mg (08/29/2018), 100 mg (08/30/2018), 100 mg (10/01/2018), 100 mg (10/02/2018), 100 mg (11/04/2018), 100 mg (11/05/2018), 100 mg (09/23/2018), 100 mg (09/24/2018), 100 mg (09/25/2018), 100 mg (09/26/2018), 100 mg (09/27/2018), 100 mg (10/28/2018), 100 mg (10/29/2018), 100 mg (10/30/2018), 100 mg (10/31/2018), 100 mg (11/01/2018), 100 mg (12/02/2018), 100 mg (12/03/2018), 100 mg (12/04/2018), 100 mg (12/05/2018), 100 mg (12/06/2018)  for chemotherapy treatment.    01/20/2019 -  Chemotherapy   The patient had decitabine (DACOGEN) 45 mg in sodium chloride 0.9 %  50 mL chemo infusion, 20 mg/m2 = 45 mg, Intravenous,  Once, 2 of 4 cycles Dose modification: 15 mg/m2 (original dose 20 mg/m2, Cycle 2, Reason: Other (see comments), Comment: neutropenic fever) Administration: 45 mg (01/20/2019), 45 mg (01/21/2019), 45 mg (01/22/2019), 45 mg (01/23/2019), 45 mg (01/24/2019), 35 mg (03/17/2019), 35 mg (03/18/2019), 35 mg (03/19/2019), 35 mg (03/20/2019), 35 mg (03/21/2019) ondansetron (ZOFRAN) 8 mg in sodium chloride 0.9 % 50 mL IVPB, 8 mg (100 % of original dose 8 mg), Intravenous,  Once, 2 of 4 cycles Dose modification: 8 mg (original dose 8 mg, Cycle 1) Administration: 8 mg (01/20/2019), 8 mg (01/21/2019), 8 mg (01/22/2019), 8 mg (01/23/2019), 8 mg (01/24/2019), 8 mg (03/17/2019), 8 mg (03/18/2019), 8 mg (03/19/2019), 8 mg (03/20/2019), 8 mg (03/21/2019)  for chemotherapy treatment.    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[place])   01/20/2019 -  Chemotherapy   The patient had decitabine (DACOGEN) 45 mg in sodium chloride 0.9 % 50 mL chemo infusion, 20 mg/m2 = 45 mg, Intravenous,  Once, 2 of 4 cycles Dose modification: 15 mg/m2 (original dose 20 mg/m2, Cycle 2, Reason: Other (see comments), Comment: neutropenic fever) Administration: 45 mg (01/20/2019), 45 mg (01/21/2019), 45 mg (01/22/2019), 45 mg (01/23/2019), 45 mg (01/24/2019), 35 mg (03/17/2019), 35 mg (03/18/2019), 35 mg (03/19/2019), 35 mg (03/20/2019), 35 mg (03/21/2019) ondansetron (ZOFRAN) 8 mg in sodium chloride 0.9 % 50 mL IVPB, 8 mg (100 % of original dose 8 mg), Intravenous,  Once, 2 of 4 cycles Dose modification: 8 mg (original dose 8 mg, Cycle 1) Administration: 8 mg (01/20/2019), 8 mg (01/21/2019), 8 mg (01/22/2019), 8 mg (01/23/2019), 8 mg (01/24/2019),  8 mg (03/17/2019), 8 mg (03/18/2019), 8 mg (03/19/2019), 8 mg (03/20/2019), 8 mg (03/21/2019)  for chemotherapy treatment.       CANCER STAGING: Cancer Staging No matching staging information was  found for the patient.   INTERVAL HISTORY:  [name] 73 y.o. male seen for follow-up of AML.  He had bone marrow biopsy done on 03/05/2019.  Appetite is 100%.  Energy levels are 25 to 50%.  Leg swelling has been chronic and stable.  Denies any fevers, night sweats or weight loss.  No chills reported.  No recent hospitalizations.  Knee pains have been stable.  REVIEW OF SYSTEMS:  Review of Systems  Constitutional: Positive for fatigue.  Musculoskeletal: Positive for arthralgias.  All other systems reviewed and are negative.    PAST MEDICAL/SURGICAL HISTORY:  Past Medical History:  Diagnosis Date  . Arthritis   . 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 Cancer ([place]) 01-28-2014   skin cancer  . CKD (chronic kidney disease), stage III   . GERD (gastroesophageal reflux disease)   . Heart murmur    NOTED DURING PHYSICAL WHEN HE WAS ENLISTING IN [facility] , DIDNT KNOW UNTIL THAT TIME AND REPORTS , "THATS THE LAST I HEARD ABOUT IT "   . History of hypertension    no longer issue  . History of kidney stones   . History of malignant melanoma of skin    excision top of scalp 2015-- no recurrence  . History of urinary retention    post op lumbar fusion surgery 04/ 2016  . Hypertension   . 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 Left ureteral calculus   . Seasonal allergies   . Wears glasses   . Wears glasses   . Wears partial dentures    upper and lower   Past Surgical History:  Procedure Laterality Date  . ANKLE FUSION Right 2007  . CARPAL TUNNEL RELEASE Left 12/28/2009   w/ pulley release left long finger  . CARPAL TUNNEL RELEASE Right 07/22/2013   Procedure: RIGHT CARPAL TUNNEL RELEASE;  Surgeon: Cammie Sickle., MD;  Location: [facility];  Service: Orthopedics;  Laterality: Right;  . COLONOSCOPY    . CYSTO/  LEFT RETROGRADE PYELOGRAM  11/21/2010  . CYSTOSCOPY WITH STENT  PLACEMENT Left 03/09/2016   Procedure: CYSTOSCOPY WITH STENT PLACEMENT;  Surgeon: Franchot Gallo, MD;  Location[facility];  Service: Urology;  Laterality: Left;  . CYSTOSCOPY/RETROGRADE/URETEROSCOPY/STONE EXTRACTION WITH BASKET Left 03/09/2016   Procedure: CYSTOSCOPY/RETROGRADE/URETEROSCOPY/STONE EXTRACTION WITH BASKET;  Surgeon: Franchot Gallo, MD;  Location: [facility];  Service: Urology;  Laterality: Left;  . LEFT URETEROSCOPIC LASER LITHOTRIPSY STONE EXTRACTION/ STENT PLACEMENT  05/23/2010  . MOHS SURGERY     TOP OF THE HEAD   . ORIF ANKLE FRACTURE Right 1978  . PORT-A-CATH REMOVAL Right 02/14/2019   Procedure: MINOR REMOVAL PORT-A-CATH;  Surgeon: Aviva Signs, MD;  Location: [facility];  Service: General;  Laterality: Right;  . PORTACATH PLACEMENT Right 08/19/2018   Procedure: INSERTION PORT-A-CATH (attached catheter in right subclavian);  Surgeon: Aviva Signs, MD;  Location: [facility];  Service: General;  Laterality: Right;  . POSTERIOR LUMBAR FUSION  08/21/2014   laminectomy and decompression L2 -- L5  . RIGHT LOWER LEG SURGERY  X3  1975 to 1976   including ORIF  . TONSILLECTOMY AND ADENOIDECTOMY  1986  . UMBILICAL HERNIA  REPAIR  2009 approx     SOCIAL HISTORY:  Social History   Socioeconomic History  . Marital status: Married    Spouse name: Not on file  . Number of children: Not on file  . Years of education: Not on file  . Highest education level: Not on file  Occupational History  . Not on file  Social Needs  . Financial resource strain: Patient refused  . Food insecurity    Worry: Patient refused    Inability: Patient refused  . Transportation needs    Medical: Patient refused    Non-medical: Patient refused  Tobacco Use  . Smoking status: Former Smoker    Years: 20.00    Types: Cigarettes    Quit date: 07/17/1986    Years since quitting: 32.7  . Smokeless tobacco: Never Used  Substance and Sexual Activity  . Alcohol use: Yes     Alcohol/week: 7.0 - 14.0 standard drinks    Types: 7 - 14 Cans of beer per week    Comment: 1 -2 beer daily  . Drug use: No  . Sexual activity: Not Currently  Lifestyle  . Physical activity    Days per week: Patient refused    Minutes per session: Patient refused  . Stress: Patient refused  Relationships  . Social Herbalist on phone: Patient refused    Gets together: Patient refused    Attends religious service: Patient refused    Active member of club or organization: Patient refused    Attends meetings of clubs or organizations: Patient refused    Relationship status: Patient refused  . Intimate partner violence    Fear of current or ex partner: Patient refused    Emotionally abused: Patient refused    Physically abused: Patient refused    Forced sexual activity: Patient refused  Other Topics Concern  . Not on file  Social History Narrative  . Not on file    FAMILY HISTORY:  Family History  Problem Relation Age of Onset  . Stroke Mother   . Prostate cancer Father   . Bone cancer Father   . Diverticulitis Father   . Rheum arthritis Sister   . Urinary tract infection Sister   . Colon cancer Neg Hx     CURRENT MEDICATIONS:  Outpatient Encounter Medications as of 03/17/2019  Medication Sig  . allopurinol (ZYLOPRIM) 300 MG tablet Take 300 mg by mouth every evening.  . cetirizine (ZYRTEC) 10 MG tablet Take 10 mg by mouth daily. IN THE MORNING  . docusate sodium (COLACE) 100 MG capsule Take 100 mg by mouth at bedtime.   . lansoprazole (PREVACID) 15 MG capsule Take 15 mg by mouth daily.   . magnesium oxide (MAG-OX) 400 MG tablet Take 400 mg by mouth at bedtime.   . tamsulosin (FLOMAX) 0.4 MG CAPS capsule Take 0.4 mg by mouth daily.  . traMADol (ULTRAM) 50 MG tablet TAKE 1 TABLET BY MOUTH THREE TIMES DAILY AS NEEDED FOR PAIN  . venetoclax 100 MG TABS Take 200 mg by mouth daily.  Marland Kitchen acetaminophen (TYLENOL) 500 MG tablet Take 1,000 mg by mouth every 6 (six)  hours as needed for mild pain or moderate pain.   Marland Kitchen diclofenac sodium (VOLTAREN) 1 % GEL Apply 1 g topically 3 (three) times daily as needed (knee pain.).   Marland Kitchen lidocaine-prilocaine (EMLA) cream APPLY A PEA SIZED AMOUNT TO PORT A CATH SITE AND COVER WITH PLASTIC WRAP ONE HOUR PRIOR TO CHEMOTHERAPY APPOINTMENT  . [  DISCONTINUED] magic mouthwash w/lidocaine SOLN Take 15 mLs by mouth 4 (four) times daily as needed for mouth pain (Sore throat/dysphagia.). (Patient not taking: Reported on 03/04/2019)   No facility-administered encounter medications on file as of 03/17/2019.     ALLERGIES:  No Known Allergies   PHYSICAL EXAM:  ECOG Performance status: 1  Vitals:   03/17/19 1017 03/17/19 1028  BP: (!) 157/88 (!) 157/88  Pulse: 81 81  Resp: 18 18  Temp: (!) 97.1 F (36.2 C) (!) 97.1 F (36.2 C)  SpO2: 97% 97%   Filed Weights   03/17/19 1017 03/17/19 1028  Weight: 221 lb 9.6 oz (100.5 kg) 221 lb 9.6 oz (100.5 kg)    Physical Exam Constitutional:      Appearance: Normal appearance.  HENT:     Mouth/Throat:     Mouth: Mucous membranes are moist.     Pharynx: Oropharynx is clear.  Eyes:     Extraocular Movements: Extraocular movements intact.     Conjunctiva/sclera: Conjunctivae normal.  Neck:     Musculoskeletal: Normal range of motion.  Cardiovascular:     Rate and Rhythm: Normal rate and regular rhythm.     Heart sounds: Normal heart sounds.  Pulmonary:     Effort: Pulmonary effort is normal.     Breath sounds: Normal breath sounds.  Abdominal:     General: There is no distension.     Palpations: Abdomen is soft. There is no mass.  Musculoskeletal: Normal range of motion.        General: No deformity.  Skin:    General: Skin is warm.  Neurological:     General: No focal deficit present.     Mental Status: He is alert and oriented to person, place, and time.  Psychiatric:        Mood and Affect: Mood normal.        Behavior: Behavior normal.      LABORATORY DATA:   I have reviewed the labs as listed.  CBC    Component Value Date/Time   WBC 5.3 03/27/2019 0855   RBC 4.31 03/27/2019 0855   HGB 13.4 03/27/2019 0855   HCT 43.0 03/27/2019 0855   PLT 150 03/27/2019 0855   MCV 99.8 03/27/2019 0855   MCH 31.1 03/27/2019 0855   MCHC 31.2 03/27/2019 0855   RDW 15.7 (H) 03/27/2019 0855   LYMPHSABS 1.2 03/27/2019 0855   MONOABS 0.4 03/27/2019 0855   EOSABS 0.1 03/27/2019 0855   BASOSABS 0.0 03/27/2019 0855   CMP Latest Ref Rng & Units 03/27/2019 03/17/2019 02/18/2019  Glucose 70 - 99 mg/dL 99 119(H) 100(H)  BUN 8 - 23 mg/dL _0 Creatinine 0.61 - 1.24 mg/dL 0.96 1.03 1.00  Sodium 135 - 145 mmol/L 139 140 140  Potassium 3.5 - 5.1 mmol/L 4.4 4.1 4.4  Chloride 98 - 111 mmol/L 105 106 104  CO2 22 - 32 mmol/L _1 Calcium 8.9 - 10.3 mg/dL 9.4 9.4 9.1  Total Protein 6.5 - 8.1 g/dL 6.4(L) 6.5 -  Total Bilirubin 0.3 - 1.2 mg/dL 1.0 0.7 -  Alkaline Phos 38 - 126 U/L 56 60 -  AST 15 - 41 U/L 25 22 -  ALT 0 - 44 U/L 26 20 -        ASSESSMENT & PLAN:   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HCC) 1.  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  Flow cytometry-12% blasts.  Chromosome analysis and FISH  panel normal. -4 cycles of Vidaza from 08/22/2018 through 12/02/2018. -Repeat bone marrow biopsy on 12/23/2018 at [facility] showed 25% blasts and 90% cellular marrow, consistent with AML with MDS related changes.  Flow cytometry showed 11% blasts.  MDS FISH panel was normal. -NGS panel did not show any targetable mutations. -Cycle 1 of decitabine (20 mg/m2) with venetoclax 300 mg daily on 01/20/2019. -He was hospitalized from 02/09/2019 through 02/18/2019 with neutropenic fever.  His venetoclax was held at that time. -His blood cultures grew Streptococcus Infantarius.  His Port-A-Cath was removed on 02/14/2019.  He also had lung infiltrates with cough which resolved.  He had a PICC line placed in the left arm. -We reviewed  results of bone marrow biopsy from 03/05/2019 which showed no increase in blasts. -We will start his cycle 3 today.  I will cut back on decitabine 15 mg per metered square. -I will also cut back on venetoclax to 200 mg daily. -We will see him back in 1 week for follow-up.  2.  CKD: -Creatinine improved to 1.03.  3.  Back pain and knee pains: -He takes tramadol 3 times a day along with Tylenol. -He also had injections in both knees and ankle.   Total time spent is 25 minutes with more than 50% of the time spent face-to-face discussing biopsy results, change in treatment plan, counseling and coordination of care.  Orders placed this encounter:  No orders of the defined types were placed in this encounter.     [name], MD [place] [phone]

## 2019-03-17 NOTE — Patient Instructions (Addendum)
[facility] at [facility] Discharge Instructions  You were seen today by Dr. Katragadda. He went over your recent test results. He will see you back in 1 week for labs and follow up.   Thank you for choosing [facility] at [facility] to provide your oncology and hematology care.  To afford each patient quality time with our provider, please arrive at least 15 minutes before your scheduled appointment time.   If you have a lab appointment with the Cancer Center please come in thru the  Main Entrance and check in at the main information desk  You need to re-schedule your appointment should you arrive 10 or more minutes late.  We strive to give you quality time with our providers, and arriving late affects you and other patients whose appointments are after yours.  Also, if you no show three or more times for appointments you may be dismissed from the clinic at the providers discretion.     Again, thank you for choosing [facility].  Our hope is that these requests will decrease the amount of time that you wait before being seen by our physicians.       _____________________________________________________________  Should you have questions after your visit to [facility], please contact our office at [phone] between the hours of 8:00 a.m. and 4:30 p.m.  Voicemails left after 4:00 p.m. will not be returned until the following business day.  For prescription refill requests, have your pharmacy contact our office and allow 72 hours.    Cancer Center Support Programs:   > Cancer Support Group  2nd Tuesday of the month 1pm-2pm, Journey Room    

## 2019-03-17 NOTE — Progress Notes (Signed)
[place] reviewed with and pt seen by Dr. Delton Coombes and pt approved for chemo tx today per MD                            [name] tolerated Decitabine infusion as well as Influenza vaccine well without complaints or incident. VSS upon discharge. Pt discharged self ambulatory in satisfactory condition

## 2019-03-18 ENCOUNTER — Encounter (HOSPITAL_COMMUNITY): Payer: Self-pay

## 2019-03-18 ENCOUNTER — Inpatient Hospital Stay (HOSPITAL_COMMUNITY): Payer: Medicare Other

## 2019-03-18 VITALS — BP 140/90 | HR 75 | Temp 96.7°F | Resp 18 | Wt 222.2 lb

## 2019-03-18 DIAGNOSIS — Z5111 Encounter for antineoplastic chemotherapy: Secondary | ICD-10-CM | POA: Diagnosis not present

## 2019-03-18 DIAGNOSIS — Z95828 Presence of other vascular implants and grafts: Secondary | ICD-10-CM

## 2019-03-18 DIAGNOSIS — C9200 Acute myeloblastic leukemia, not having achieved remission: Secondary | ICD-10-CM

## 2019-03-18 DIAGNOSIS — D46Z Other myelodysplastic syndromes: Secondary | ICD-10-CM

## 2019-03-18 MED ORDER — HEPARIN SOD (PORK) LOCK FLUSH 100 UNIT/ML IV SOLN
500.0000 [IU] | Freq: Once | INTRAVENOUS | Status: AC | PRN
  Administered 2019-03-18: 500 [IU]

## 2019-03-18 MED ORDER — SODIUM CHLORIDE 0.9 % IV SOLN
Freq: Once | INTRAVENOUS | Status: AC
  Administered 2019-03-18: 12:00:00 via INTRAVENOUS

## 2019-03-18 MED ORDER — SODIUM CHLORIDE 0.9 % IV SOLN
8.0000 mg | Freq: Once | INTRAVENOUS | Status: AC
  Administered 2019-03-18: 8 mg via INTRAVENOUS
  Filled 2019-03-18: qty 4

## 2019-03-18 MED ORDER — SODIUM CHLORIDE 0.9 % IV SOLN
15.0000 mg/m2 | Freq: Once | INTRAVENOUS | Status: AC
  Administered 2019-03-18: 35 mg via INTRAVENOUS
  Filled 2019-03-18: qty 7

## 2019-03-18 MED ORDER — SODIUM CHLORIDE 0.9% FLUSH
10.0000 mL | INTRAVENOUS | Status: DC | PRN
  Administered 2019-03-18: 10 mL
  Filled 2019-03-18: qty 10

## 2019-03-18 NOTE — Progress Notes (Signed)
To treatment room for chemotherapy.  No s/s of distress noted.    Patient tolerated chemotherapy with no complaints voiced.  PICC line clean and dry with no bruising or swelling noted at site.  Good blood return noted before and after administration of chemotherapy.  Dressing intact.   Patient left ambulatory with VSS and no s/s of distress noted.

## 2019-03-19 ENCOUNTER — Other Ambulatory Visit: Payer: Self-pay

## 2019-03-19 ENCOUNTER — Encounter (HOSPITAL_COMMUNITY): Payer: Self-pay | Admitting: *Deleted

## 2019-03-19 ENCOUNTER — Inpatient Hospital Stay (HOSPITAL_COMMUNITY): Payer: Medicare Other

## 2019-03-19 ENCOUNTER — Ambulatory Visit (HOSPITAL_COMMUNITY): Payer: Medicare Other | Admitting: Hematology

## 2019-03-19 VITALS — BP 145/85 | HR 64 | Temp 98.4°F | Resp 16

## 2019-03-19 DIAGNOSIS — Z5111 Encounter for antineoplastic chemotherapy: Secondary | ICD-10-CM | POA: Diagnosis not present

## 2019-03-19 DIAGNOSIS — Z95828 Presence of other vascular implants and grafts: Secondary | ICD-10-CM

## 2019-03-19 DIAGNOSIS — C9200 Acute myeloblastic leukemia, not having achieved remission: Secondary | ICD-10-CM

## 2019-03-19 DIAGNOSIS — D46Z Other myelodysplastic syndromes: Secondary | ICD-10-CM

## 2019-03-19 MED ORDER — SODIUM CHLORIDE 0.9 % IV SOLN
15.0000 mg/m2 | Freq: Once | INTRAVENOUS | Status: AC
  Administered 2019-03-19: 35 mg via INTRAVENOUS
  Filled 2019-03-19: qty 7

## 2019-03-19 MED ORDER — SODIUM CHLORIDE 0.9% FLUSH
10.0000 mL | INTRAVENOUS | Status: DC | PRN
  Administered 2019-03-19: 10 mL
  Filled 2019-03-19: qty 10

## 2019-03-19 MED ORDER — SODIUM CHLORIDE 0.9 % IV SOLN
Freq: Once | INTRAVENOUS | Status: AC
  Administered 2019-03-19: 12:00:00 via INTRAVENOUS

## 2019-03-19 MED ORDER — HEPARIN SOD (PORK) LOCK FLUSH 100 UNIT/ML IV SOLN
500.0000 [IU] | Freq: Once | INTRAVENOUS | Status: AC | PRN
  Administered 2019-03-19: 500 [IU]

## 2019-03-19 MED ORDER — SODIUM CHLORIDE 0.9 % IV SOLN
8.0000 mg | Freq: Once | INTRAVENOUS | Status: AC
  Administered 2019-03-19: 8 mg via INTRAVENOUS
  Filled 2019-03-19: qty 4

## 2019-03-19 NOTE — Progress Notes (Signed)
Lauren, RN from [facility] called stating that they were going to be discharging patient from home health services. They want [place] to proceed with flushing PICC line, dressing changes and labs as needed.

## 2019-03-19 NOTE — Progress Notes (Signed)
Treatment given per orders. Patient tolerated it well without problems. Vitals stable and discharged home from clinic ambulatory. Follow up as scheduled.  

## 2019-03-19 NOTE — Patient Instructions (Signed)
[facility] Discharge Instructions for Patients Receiving Chemotherapy  Today you received the following chemotherapy agents   To help prevent nausea and vomiting after your treatment, we encourage you to take your nausea medication   If you develop nausea and vomiting that is not controlled by your nausea medication, call the clinic.   BELOW ARE SYMPTOMS THAT SHOULD BE REPORTED IMMEDIATELY:  *FEVER GREATER THAN 100.5 F  *CHILLS WITH OR WITHOUT FEVER  NAUSEA AND VOMITING THAT IS NOT CONTROLLED WITH YOUR NAUSEA MEDICATION  *UNUSUAL SHORTNESS OF BREATH  *UNUSUAL BRUISING OR BLEEDING  TENDERNESS IN MOUTH AND THROAT WITH OR WITHOUT PRESENCE OF ULCERS  *URINARY PROBLEMS  *BOWEL PROBLEMS  UNUSUAL RASH Items with * indicate a potential emergency and should be followed up as soon as possible.  Feel free to call the clinic should you have any questions or concerns. The clinic phone number is [phone].  Please show the CHEMO ALERT CARD at check-in to the Emergency Department and triage nurse.   

## 2019-03-20 ENCOUNTER — Inpatient Hospital Stay (HOSPITAL_COMMUNITY): Payer: Medicare Other

## 2019-03-20 VITALS — BP 143/82 | HR 77 | Temp 97.1°F | Resp 18

## 2019-03-20 DIAGNOSIS — Z95828 Presence of other vascular implants and grafts: Secondary | ICD-10-CM

## 2019-03-20 DIAGNOSIS — C9200 Acute myeloblastic leukemia, not having achieved remission: Secondary | ICD-10-CM

## 2019-03-20 DIAGNOSIS — D46Z Other myelodysplastic syndromes: Secondary | ICD-10-CM

## 2019-03-20 DIAGNOSIS — Z5111 Encounter for antineoplastic chemotherapy: Secondary | ICD-10-CM | POA: Diagnosis not present

## 2019-03-20 MED ORDER — SODIUM CHLORIDE 0.9 % IV SOLN
15.0000 mg/m2 | Freq: Once | INTRAVENOUS | Status: AC
  Administered 2019-03-20: 35 mg via INTRAVENOUS
  Filled 2019-03-20: qty 7

## 2019-03-20 MED ORDER — SODIUM CHLORIDE 0.9 % IV SOLN
8.0000 mg | Freq: Once | INTRAVENOUS | Status: AC
  Administered 2019-03-20: 8 mg via INTRAVENOUS
  Filled 2019-03-20: qty 4

## 2019-03-20 MED ORDER — SODIUM CHLORIDE 0.9% FLUSH
10.0000 mL | INTRAVENOUS | Status: DC | PRN
  Administered 2019-03-20: 10 mL
  Filled 2019-03-20: qty 10

## 2019-03-20 MED ORDER — SODIUM CHLORIDE 0.9 % IV SOLN
Freq: Once | INTRAVENOUS | Status: AC
  Administered 2019-03-20: 08:00:00 via INTRAVENOUS

## 2019-03-20 MED ORDER — HEPARIN SOD (PORK) LOCK FLUSH 100 UNIT/ML IV SOLN
500.0000 [IU] | Freq: Once | INTRAVENOUS | Status: AC | PRN
  Administered 2019-03-20: 500 [IU]

## 2019-03-20 NOTE — Progress Notes (Signed)
Treatment given per orders. Patient tolerated it well without problems. Vitals stable and discharged home from clinic ambulatory. Follow up as scheduled.  

## 2019-03-21 ENCOUNTER — Inpatient Hospital Stay (HOSPITAL_COMMUNITY): Payer: Medicare Other

## 2019-03-21 ENCOUNTER — Encounter (HOSPITAL_COMMUNITY): Payer: Self-pay

## 2019-03-21 ENCOUNTER — Other Ambulatory Visit: Payer: Self-pay

## 2019-03-21 VITALS — BP 140/76 | HR 77 | Temp 97.8°F | Resp 18

## 2019-03-21 DIAGNOSIS — Z95828 Presence of other vascular implants and grafts: Secondary | ICD-10-CM

## 2019-03-21 DIAGNOSIS — Z5111 Encounter for antineoplastic chemotherapy: Secondary | ICD-10-CM | POA: Diagnosis not present

## 2019-03-21 DIAGNOSIS — C9200 Acute myeloblastic leukemia, not having achieved remission: Secondary | ICD-10-CM

## 2019-03-21 DIAGNOSIS — D46Z Other myelodysplastic syndromes: Secondary | ICD-10-CM

## 2019-03-21 MED ORDER — SODIUM CHLORIDE 0.9 % IV SOLN
8.0000 mg | Freq: Once | INTRAVENOUS | Status: AC
  Administered 2019-03-21: 8 mg via INTRAVENOUS
  Filled 2019-03-21: qty 4

## 2019-03-21 MED ORDER — HEPARIN SOD (PORK) LOCK FLUSH 100 UNIT/ML IV SOLN
500.0000 [IU] | Freq: Once | INTRAVENOUS | Status: AC | PRN
  Administered 2019-03-21: 500 [IU]

## 2019-03-21 MED ORDER — SODIUM CHLORIDE 0.9 % IV SOLN
15.0000 mg/m2 | Freq: Once | INTRAVENOUS | Status: AC
  Administered 2019-03-21: 35 mg via INTRAVENOUS
  Filled 2019-03-21: qty 7

## 2019-03-21 MED ORDER — SODIUM CHLORIDE 0.9 % IV SOLN
Freq: Once | INTRAVENOUS | Status: AC
  Administered 2019-03-21: 09:00:00 via INTRAVENOUS

## 2019-03-21 MED ORDER — SODIUM CHLORIDE 0.9% FLUSH
10.0000 mL | INTRAVENOUS | Status: DC | PRN
  Administered 2019-03-21: 10 mL
  Filled 2019-03-21: qty 10

## 2019-03-21 NOTE — Progress Notes (Signed)
Patient tolerated chemotherapy with no complaints voiced.  PICC line site clean and dry with no bruising or swelling noted at site.  Good blood return noted before and after administration of chemotherapy.    Patient left ambulatory with VSS and no s/s of distress noted.

## 2019-03-27 ENCOUNTER — Encounter (HOSPITAL_COMMUNITY): Payer: Self-pay | Admitting: Hematology

## 2019-03-27 ENCOUNTER — Other Ambulatory Visit: Payer: Self-pay

## 2019-03-27 ENCOUNTER — Inpatient Hospital Stay (HOSPITAL_COMMUNITY): Payer: Medicare Other

## 2019-03-27 ENCOUNTER — Inpatient Hospital Stay (HOSPITAL_BASED_OUTPATIENT_CLINIC_OR_DEPARTMENT_OTHER): Payer: Medicare Other | Admitting: Hematology

## 2019-03-27 DIAGNOSIS — Z5111 Encounter for antineoplastic chemotherapy: Secondary | ICD-10-CM | POA: Diagnosis not present

## 2019-03-27 DIAGNOSIS — D61818 Other pancytopenia: Secondary | ICD-10-CM

## 2019-03-27 DIAGNOSIS — C9200 Acute myeloblastic leukemia, not having achieved remission: Secondary | ICD-10-CM

## 2019-03-27 DIAGNOSIS — D46Z Other myelodysplastic syndromes: Secondary | ICD-10-CM

## 2019-03-27 LAB — COMPREHENSIVE METABOLIC PANEL
ALT: 26 U/L (ref 0–44)
AST: 25 U/L (ref 15–41)
Albumin: 3.7 g/dL (ref 3.5–5.0)
Alkaline Phosphatase: 56 U/L (ref 38–126)
Anion gap: 9 (ref 5–15)
BUN: 23 mg/dL (ref 8–23)
CO2: 25 mmol/L (ref 22–32)
Calcium: 9.4 mg/dL (ref 8.9–10.3)
Chloride: 105 mmol/L (ref 98–111)
Creatinine, Ser: 0.96 mg/dL (ref 0.61–1.24)
GFR calc Af Amer: >60 mL/min (ref >60)
GFR calc non Af Amer: >60 mL/min (ref >60)
Glucose, Bld: 99 mg/dL (ref 70–99)
Potassium: 4.4 mmol/L (ref 3.5–5.1)
Sodium: 139 mmol/L (ref 135–145)
Total Bilirubin: 1 mg/dL (ref 0.3–1.2)
Total Protein: 6.4 g/dL — ABNORMAL LOW (ref 6.5–8.1)

## 2019-03-27 LAB — CBC WITH DIFFERENTIAL/PLATELET
Abs Immature Granulocytes: 0.02 10*3/uL (ref 0.00–0.07)
Basophils Absolute: 0 10*3/uL (ref 0.0–0.1)
Basophils Relative: 1 %
Eosinophils Absolute: 0.1 10*3/uL (ref 0.0–0.5)
Eosinophils Relative: 2 %
HCT: 43 % (ref 39.0–52.0)
Hemoglobin: 13.4 g/dL (ref 13.0–17.0)
Immature Granulocytes: 0 %
Lymphocytes Relative: 22 %
Lymphs Abs: 1.2 10*3/uL (ref 0.7–4.0)
MCH: 31.1 pg (ref 26.0–34.0)
MCHC: 31.2 g/dL (ref 30.0–36.0)
MCV: 99.8 fL (ref 80.0–100.0)
Monocytes Absolute: 0.4 10*3/uL (ref 0.1–1.0)
Monocytes Relative: 8 %
Neutro Abs: 3.5 10*3/uL (ref 1.7–7.7)
Neutrophils Relative %: 67 %
Platelets: 150 10*3/uL (ref 150–400)
RBC: 4.31 MIL/uL (ref 4.22–5.81)
RDW: 15.7 % — ABNORMAL HIGH (ref 11.5–15.5)
WBC: 5.3 10*3/uL (ref 4.0–10.5)
nRBC: 0 % (ref 0.0–0.2)

## 2019-03-27 LAB — MAGNESIUM: Magnesium: 1.8 mg/dL (ref 1.7–2.4)

## 2019-03-27 MED ORDER — SODIUM CHLORIDE 0.9% FLUSH
3.0000 mL | INTRAVENOUS | Status: DC | PRN
  Administered 2019-03-27: 3 mL via INTRAVENOUS
  Filled 2019-03-27: qty 10

## 2019-03-27 MED ORDER — HEPARIN SOD (PORK) LOCK FLUSH 100 UNIT/ML IV SOLN
250.0000 [IU] | Freq: Once | INTRAVENOUS | Status: AC
  Administered 2019-03-27: 250 [IU] via INTRAVENOUS

## 2019-03-27 NOTE — Progress Notes (Signed)
Richard Wallace [address], [place] [address]   CLINIC:  Medical Oncology/Hematology  PCP:  [name], MD [place] [id number] [phone]   REASON FOR VISIT:  Follow-up for newly diagnosed AML.  CURRENT THERAPY: Decitabine and venetoclax  BRIEF ONCOLOGIC HISTORY:  Oncology History  MDS (myelodysplastic syndrome), high grade (HCC)  08/14/2018 Initial Diagnosis   MDS (myelodysplastic syndrome), high grade ([place])   08/22/2018 - 12/31/2018 Chemotherapy   The patient had palonosetron (ALOXI) injection 0.25 mg, 0.25 mg, Intravenous,  Once, 4 of 6 cycles Administration: 0.25 mg (08/22/2018), 0.25 mg (08/26/2018), 0.25 mg (08/28/2018), 0.25 mg (08/30/2018), 0.25 mg (10/01/2018), 0.25 mg (10/02/2018), 0.25 mg (11/04/2018), 0.25 mg (09/23/2018), 0.25 mg (09/25/2018), 0.25 mg (09/27/2018), 0.25 mg (10/28/2018), 0.25 mg (10/30/2018), 0.25 mg (11/01/2018), 0.25 mg (12/02/2018), 0.25 mg (12/04/2018), 0.25 mg (12/06/2018) azaCITIDine (VIDAZA) 100 mg in sodium chloride 0.9 % 50 mL chemo infusion, 110 mg (66.7 % of original dose 75 mg/m2), Intravenous, Once, 4 of 6 cycles Dose modification: 50 mg/m2 (original dose 75 mg/m2, Cycle 1, Reason: Provider Judgment), 50 mg/m2 (original dose 75 mg/m2, Cycle 2, Reason: Provider Judgment) Administration: 100 mg (08/22/2018), 100 mg (08/23/2018), 100 mg (08/26/2018), 100 mg (08/27/2018), 100 mg (08/28/2018), 100 mg (08/29/2018), 100 mg (08/30/2018), 100 mg (10/01/2018), 100 mg (10/02/2018), 100 mg (11/04/2018), 100 mg (11/05/2018), 100 mg (09/23/2018), 100 mg (09/24/2018), 100 mg (09/25/2018), 100 mg (09/26/2018), 100 mg (09/27/2018), 100 mg (10/28/2018), 100 mg (10/29/2018), 100 mg (10/30/2018), 100 mg (10/31/2018), 100 mg (11/01/2018), 100 mg (12/02/2018), 100 mg (12/03/2018), 100 mg (12/04/2018), 100 mg (12/05/2018), 100 mg (12/06/2018)  for chemotherapy treatment.    01/20/2019 -  Chemotherapy   The patient had decitabine (DACOGEN) 45 mg in sodium chloride 0.9 %  50 mL chemo infusion, 20 mg/m2 = 45 mg, Intravenous,  Once, 2 of 4 cycles Dose modification: 15 mg/m2 (original dose 20 mg/m2, Cycle 2, Reason: Other (see comments), Comment: neutropenic fever) Administration: 45 mg (01/20/2019), 45 mg (01/21/2019), 45 mg (01/22/2019), 45 mg (01/23/2019), 45 mg (01/24/2019), 35 mg (03/17/2019), 35 mg (03/18/2019), 35 mg (03/19/2019), 35 mg (03/20/2019), 35 mg (03/21/2019) ondansetron (ZOFRAN) 8 mg in sodium chloride 0.9 % 50 mL IVPB, 8 mg (100 % of original dose 8 mg), Intravenous,  Once, 2 of 4 cycles Dose modification: 8 mg (original dose 8 mg, Cycle 1) Administration: 8 mg (01/20/2019), 8 mg (01/21/2019), 8 mg (01/22/2019), 8 mg (01/23/2019), 8 mg (01/24/2019), 8 mg (03/17/2019), 8 mg (03/18/2019), 8 mg (03/19/2019), 8 mg (03/20/2019), 8 mg (03/21/2019)  for chemotherapy treatment.    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[place])   01/20/2019 -  Chemotherapy   The patient had decitabine (DACOGEN) 45 mg in sodium chloride 0.9 % 50 mL chemo infusion, 20 mg/m2 = 45 mg, Intravenous,  Once, 2 of 4 cycles Dose modification: 15 mg/m2 (original dose 20 mg/m2, Cycle 2, Reason: Other (see comments), Comment: neutropenic fever) Administration: 45 mg (01/20/2019), 45 mg (01/21/2019), 45 mg (01/22/2019), 45 mg (01/23/2019), 45 mg (01/24/2019), 35 mg (03/17/2019), 35 mg (03/18/2019), 35 mg (03/19/2019), 35 mg (03/20/2019), 35 mg (03/21/2019) ondansetron (ZOFRAN) 8 mg in sodium chloride 0.9 % 50 mL IVPB, 8 mg (100 % of original dose 8 mg), Intravenous,  Once, 2 of 4 cycles Dose modification: 8 mg (original dose 8 mg, Cycle 1) Administration: 8 mg (01/20/2019), 8 mg (01/21/2019), 8 mg (01/22/2019), 8 mg (01/23/2019), 8 mg (01/24/2019),  8 mg (03/17/2019), 8 mg (03/18/2019), 8 mg (03/19/2019), 8 mg (03/20/2019), 8 mg (03/21/2019)  for chemotherapy treatment.       CANCER STAGING: Cancer Staging No matching staging information was  found for the patient.   INTERVAL HISTORY:  Richard Wallace 73 y.o. male seen for follow-up of AML.  He received cycle 2 of decitabine on 03/17/2019.  He also started venetoclax 200 mg daily.  Denies any fevers, night sweats or weight loss.  Denies any nausea, vomiting, diarrhea or constipation.  Appetite is 100%.  Energy levels are 50%.  Pain from arthritis is stable.  REVIEW OF SYSTEMS:  Review of Systems  Constitutional: Positive for fatigue.  Musculoskeletal: Positive for arthralgias.  All other systems reviewed and are negative.    PAST MEDICAL/SURGICAL HISTORY:  Past Medical History:  Diagnosis Date  . Arthritis   . 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 Cancer ([place]) 01-28-2014   skin cancer  . CKD (chronic kidney disease), stage III   . GERD (gastroesophageal reflux disease)   . Heart murmur    NOTED DURING PHYSICAL WHEN HE WAS ENLISTING IN [place] , DIDNT KNOW UNTIL THAT TIME AND REPORTS , "THATS THE LAST I HEARD ABOUT IT "   . History of hypertension    no longer issue  . History of kidney stones   . History of malignant melanoma of skin    excision top of scalp 2015-- no recurrence  . History of urinary retention    post op lumbar fusion surgery 04/ 2016  . Hypertension   . 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 Left ureteral calculus   . Seasonal allergies   . Wears glasses   . Wears glasses   . Wears partial dentures    upper and lower   Past Surgical History:  Procedure Laterality Date  . ANKLE FUSION Right 2007  . CARPAL TUNNEL RELEASE Left 12/28/2009   w/ pulley release left long finger  . CARPAL TUNNEL RELEASE Right 07/22/2013   Procedure: RIGHT CARPAL TUNNEL RELEASE;  Surgeon: Cammie Sickle., MD;  Location: [facility];  Service: Orthopedics;  Laterality: Right;  . COLONOSCOPY    . CYSTO/  LEFT RETROGRADE PYELOGRAM  11/21/2010  . CYSTOSCOPY WITH STENT  PLACEMENT Left 03/09/2016   Procedure: CYSTOSCOPY WITH STENT PLACEMENT;  Surgeon: Franchot Gallo, MD;  Location: [facility];  Service: Urology;  Laterality: Left;  . CYSTOSCOPY/RETROGRADE/URETEROSCOPY/STONE EXTRACTION WITH BASKET Left 03/09/2016   Procedure: CYSTOSCOPY/RETROGRADE/URETEROSCOPY/STONE EXTRACTION WITH BASKET;  Surgeon: Franchot Gallo, MD;  Location: [facility];  Service: Urology;  Laterality: Left;  . LEFT URETEROSCOPIC LASER LITHOTRIPSY STONE EXTRACTION/ STENT PLACEMENT  05/23/2010  . MOHS SURGERY     TOP OF THE HEAD   . ORIF ANKLE FRACTURE Right 1978  . PORT-A-CATH REMOVAL Right 02/14/2019   Procedure: MINOR REMOVAL PORT-A-CATH;  Surgeon: Aviva Signs, MD;  Location: [facility];  Service: General;  Laterality: Right;  . PORTACATH PLACEMENT Right 08/19/2018   Procedure: INSERTION PORT-A-CATH (attached catheter in right subclavian);  Surgeon: Aviva Signs, MD;  Location: [facility];  Service: General;  Laterality: Right;  . POSTERIOR LUMBAR FUSION  08/21/2014   laminectomy and decompression L2 -- L5  . RIGHT LOWER LEG SURGERY  X3  1975 to 1976   including ORIF  . TONSILLECTOMY AND ADENOIDECTOMY  1986  . UMBILICAL HERNIA REPAIR  2009 approx     SOCIAL HISTORY:  Social History   Socioeconomic History  . Marital status: Married    Spouse name: Not on file  . Number of children: Not on file  . Years of education: Not on file  . Highest education level: Not on file  Occupational History  . Not on file  Social Needs  . Financial resource strain: Patient refused  . Food insecurity    Worry: Patient refused    Inability: Patient refused  . Transportation needs    Medical: Patient refused    Non-medical: Patient refused  Tobacco Use  . Smoking status: Former Smoker    Years: 20.00    Types: Cigarettes    Quit date: 07/17/1986    Years since quitting: 32.7  . Smokeless tobacco: Never Used  Substance and Sexual Activity  . Alcohol use: Yes     Alcohol/week: 7.0 - 14.0 standard drinks    Types: 7 - 14 Cans of beer per week    Comment: 1 -2 beer daily  . Drug use: No  . Sexual activity: Not Currently  Lifestyle  . Physical activity    Days per week: Patient refused    Minutes per session: Patient refused  . Stress: Patient refused  Relationships  . Social Herbalist on phone: Patient refused    Gets together: Patient refused    Attends religious service: Patient refused    Active member of club or organization: Patient refused    Attends meetings of clubs or organizations: Patient refused    Relationship status: Patient refused  . Intimate partner violence    Fear of current or ex partner: Patient refused    Emotionally abused: Patient refused    Physically abused: Patient refused    Forced sexual activity: Patient refused  Other Topics Concern  . Not on file  Social History Narrative  . Not on file    FAMILY HISTORY:  Family History  Problem Relation Age of Onset  . Stroke Mother   . Prostate cancer Father   . Bone cancer Father   . Diverticulitis Father   . Rheum arthritis Sister   . Urinary tract infection Sister   . Colon cancer Neg Hx     CURRENT MEDICATIONS:  Outpatient Encounter Medications as of 03/27/2019  Medication Sig  . allopurinol (ZYLOPRIM) 300 MG tablet Take 300 mg by mouth every evening.  . cetirizine (ZYRTEC) 10 MG tablet Take 10 mg by mouth daily. IN THE MORNING  . docusate sodium (COLACE) 100 MG capsule Take 100 mg by mouth at bedtime.   . lansoprazole (PREVACID) 15 MG capsule Take 15 mg by mouth daily.   . magnesium oxide (MAG-OX) 400 MG tablet Take 400 mg by mouth at bedtime.   . tamsulosin (FLOMAX) 0.4 MG CAPS capsule Take 0.4 mg by mouth daily.  Marland Kitchen venetoclax 100 MG TABS Take 200 mg by mouth daily.  Marland Kitchen acetaminophen (TYLENOL) 500 MG tablet Take 1,000 mg by mouth every 6 (six) hours as needed for mild pain or moderate pain.   Marland Kitchen diclofenac sodium (VOLTAREN) 1 % GEL Apply 1  g topically 3 (three) times daily as needed (knee pain.).   Marland Kitchen lidocaine-prilocaine (EMLA) cream APPLY A PEA SIZED AMOUNT TO PORT A CATH SITE AND COVER WITH PLASTIC WRAP ONE HOUR PRIOR TO CHEMOTHERAPY APPOINTMENT  . traMADol (ULTRAM) 50 MG tablet TAKE 1 TABLET BY MOUTH THREE TIMES DAILY AS NEEDED FOR PAIN (Patient not taking:  Reported on 03/27/2019)   No facility-administered encounter medications on file as of 03/27/2019.     ALLERGIES:  No Known Allergies   PHYSICAL EXAM:  ECOG Performance status: 1  Vitals:   03/27/19 0847  BP: (!) 145/86  Pulse: 80  Resp: 18  Temp: (!) 97.1 F (36.2 C)  SpO2: 95%   Filed Weights   03/27/19 0847  Weight: 219 lb 3.2 oz (99.4 kg)    Physical Exam Constitutional:      Appearance: Normal appearance.  HENT:     Mouth/Throat:     Mouth: Mucous membranes are moist.     Pharynx: Oropharynx is clear.  Eyes:     Extraocular Movements: Extraocular movements intact.     Conjunctiva/sclera: Conjunctivae normal.  Neck:     Musculoskeletal: Normal range of motion.  Cardiovascular:     Rate and Rhythm: Normal rate and regular rhythm.     Heart sounds: Normal heart sounds.  Pulmonary:     Effort: Pulmonary effort is normal.     Breath sounds: Normal breath sounds.  Abdominal:     General: There is no distension.     Palpations: Abdomen is soft. There is no mass.  Musculoskeletal: Normal range of motion.        General: No deformity.  Skin:    General: Skin is warm.  Neurological:     General: No focal deficit present.     Mental Status: He is alert and oriented to person, place, and time.  Psychiatric:        Mood and Affect: Mood normal.        Behavior: Behavior normal.      LABORATORY DATA:  I have reviewed the labs as listed.  CBC    Component Value Date/Time   WBC 5.3 03/27/2019 0855   RBC 4.31 03/27/2019 0855   HGB 13.4 03/27/2019 0855   HCT 43.0 03/27/2019 0855   PLT 150 03/27/2019 0855   MCV 99.8 03/27/2019 0855    MCH 31.1 03/27/2019 0855   MCHC 31.2 03/27/2019 0855   RDW 15.7 (H) 03/27/2019 0855   LYMPHSABS 1.2 03/27/2019 0855   MONOABS 0.4 03/27/2019 0855   EOSABS 0.1 03/27/2019 0855   BASOSABS 0.0 03/27/2019 0855   CMP Latest Ref Rng & Units 03/27/2019 03/17/2019 02/18/2019  Glucose 70 - 99 mg/dL 99 119(H) 100(H)  BUN 8 - 23 mg/dL '23 19 20  '$ Creatinine 0.61 - 1.24 mg/dL 0.96 1.03 1.00  Sodium 135 - 145 mmol/L 139 140 140  Potassium 3.5 - 5.1 mmol/L 4.4 4.1 4.4  Chloride 98 - 111 mmol/L 105 106 104  CO2 22 - 32 mmol/L '25 27 25  '$ Calcium 8.9 - 10.3 mg/dL 9.4 9.4 9.1  Total Protein 6.5 - 8.1 g/dL 6.4(L) 6.5 -  Total Bilirubin 0.3 - 1.2 mg/dL 1.0 0.7 -  Alkaline Phos 38 - 126 U/L 56 60 -  AST 15 - 41 U/L 25 22 -  ALT 0 - 44 U/L 26 20 -        ASSESSMENT & PLAN:   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HCC) 1.  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  Flow cytometry-12% blasts.  Chromosome analysis and FISH panel normal. -4 cycles of Vidaza from 08/22/2018 through 12/02/2018. -Repeat bone marrow biopsy on 12/23/2018 at [facility] showed 25% blasts and 90% cellular marrow, consistent with AML with MDS related changes.  Flow cytometry showed 11% blasts.  MDS FISH panel was normal. -  NGS panel did not show any targetable mutations. -Cycle 1 of decitabine (20 mg/m2) with venetoclax 300 mg daily on 01/20/2019. -He was hospitalized from 02/09/2019 through 02/18/2019 with neutropenic fever.  His venetoclax was held at that time. -His blood cultures grew Streptococcus Infantarius.  His Port-A-Cath was removed on 02/14/2019.  He also had lung infiltrates with cough which resolved.  He had a PICC line placed in the left arm. -Cycle 2-day 1 on 03/17/2019.  He is taking venetoclax 200 mg daily. -He denies any GI side effects.  We reviewed his blood work which is grossly within normal limits. -I have told him to discontinue venetoclax on 03/31/2019.  I will see him back on 04/14/2019  for his next cycle with repeat labs.  2.  CKD: -Creatinine stable between 1.3-1.4.  3.  Back pain and knee pains: -He takes tramadol 3 times a day along with Tylenol. -He also had injections in both knees and ankle.    Orders placed this encounter:  No orders of the defined types were placed in this encounter.     [name], MD [place] [phone]

## 2019-03-27 NOTE — Patient Instructions (Addendum)
[place] at [facility] Discharge Instructions  You were seen today by Dr. Delton Coombes. He went over your recent lab results. STOP taking the pills next Monday for 1 week. He will see you back in 2 weeks for labs and follow up.   Thank you for choosing [place] at [facility] to provide your oncology and hematology care.  To afford each patient quality time with our provider, please arrive at least 15 minutes before your scheduled appointment time.   If you have a lab appointment with the [facility] please come in thru the  Main Entrance and check in at the main information desk  You need to re-schedule your appointment should you arrive 10 or more minutes late.  We strive to give you quality time with our providers, and arriving late affects you and other patients whose appointments are after yours.  Also, if you no show three or more times for appointments you may be dismissed from the clinic at the providers discretion.     Again, thank you for choosing [facility].  Our hope is that these requests will decrease the amount of time that you wait before being seen by our physicians.       _____________________________________________________________  Should you have questions after your visit to [facility], please contact our office at [phone] between the hours of 8:00 a.m. and 4:30 p.m.  Voicemails left after 4:00 p.m. will not be returned until the following business day.  For prescription refill requests, have your pharmacy contact our office and allow 72 hours.    Cancer Center Support Programs:   > Cancer Support Group  2nd Tuesday of the month 1pm-2pm, Journey Room

## 2019-03-27 NOTE — Assessment & Plan Note (Signed)
1.  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  Flow cytometry-12% blasts.  Chromosome analysis and FISH panel normal. -4 cycles of Vidaza from 08/22/2018 through 12/02/2018. -Repeat bone marrow biopsy on 12/23/2018 at [facility] showed 25% blasts and 90% cellular marrow, consistent with AML with MDS related changes.  Flow cytometry showed 11% blasts.  MDS FISH panel was normal. -NGS panel did not show any targetable mutations. -Cycle 1 of decitabine (20 mg/m2) with venetoclax 300 mg daily on 01/20/2019. -He was hospitalized from 02/09/2019 through 02/18/2019 with neutropenic fever.  His venetoclax was held at that time. -His blood cultures grew Streptococcus Infantarius.  His Port-A-Cath was removed on 02/14/2019.  He also had lung infiltrates with cough which resolved.  He had a PICC line placed in the left arm. -Cycle 2-day 1 on 03/17/2019.  He is taking venetoclax 200 mg daily. -He denies any GI side effects.  We reviewed his blood work which is grossly within normal limits. -I have told him to discontinue venetoclax on 03/31/2019.  I will see him back on 04/14/2019 for his next cycle with repeat labs.  2.  CKD: -Creatinine stable between 1.3-1.4.  3.  Back pain and knee pains: -He takes tramadol 3 times a day along with Tylenol. -He also had injections in both knees and ankle.

## 2019-03-27 NOTE — Progress Notes (Signed)
[name] tolerated PICC line lab draw with dressing change and flush well without complaints or incident. PICC line flushed easily per protocol with dressing,antimicrobial patch and securing device changed. PICC line site without any redness or drainage noted. Labs reviewed with and pt seen by Dr. Delton Coombes today as well. VSS Pt's wife is a Marine scientist and will flush his PICC line on Sunday and he will return to the clinic next week for flush and dressing change. Pt discharged self ambulatory in satisfactory condition accompanied by his wife

## 2019-03-27 NOTE — Patient Instructions (Signed)
[place] at [facility] Discharge Instructions  Labs drawn from PICC line with dressing change and flush per protocol today. Follow-up as scheduled. Call clinic for any questions or concerns   Thank you for choosing [place] at [facility] to provide your oncology and hematology care.  To afford each patient quality time with our provider, please arrive at least 15 minutes before your scheduled appointment time.   If you have a lab appointment with the [place] please come in thru the Main Entrance and check in at the main information desk.  You need to re-schedule your appointment should you arrive 10 or more minutes late.  We strive to give you quality time with our providers, and arriving late affects you and other patients whose appointments are after yours.  Also, if you no show three or more times for appointments you may be dismissed from the clinic at the providers discretion.     Again, thank you for choosing [facility].  Our hope is that these requests will decrease the amount of time that you wait before being seen by our physicians.       _____________________________________________________________  Should you have questions after your visit to [facility], please contact our office at [phone] between the hours of 8:00 a.m. and 4:30 p.m.  Voicemails left after 4:00 p.m. will not be returned until the following business day.  For prescription refill requests, have your pharmacy contact our office and allow 72 hours.    Due to Covid, you will need to wear a mask upon entering the hospital. If you do not have a mask, a mask will be given to you at the Main Entrance upon arrival. For doctor visits, patients may have 1 support person with them. For treatment visits, patients can not have anyone with them due to social distancing guidelines and our immunocompromised population.

## 2019-04-02 ENCOUNTER — Other Ambulatory Visit: Payer: Self-pay

## 2019-04-02 ENCOUNTER — Inpatient Hospital Stay (HOSPITAL_COMMUNITY): Payer: Medicare Other

## 2019-04-02 DIAGNOSIS — Z5111 Encounter for antineoplastic chemotherapy: Secondary | ICD-10-CM | POA: Diagnosis not present

## 2019-04-02 MED ORDER — FULVESTRANT 250 MG/5ML IM SOLN
INTRAMUSCULAR | Status: AC
  Filled 2019-04-02: qty 5

## 2019-04-02 MED ORDER — HEPARIN SOD (PORK) LOCK FLUSH 100 UNIT/ML IV SOLN
250.0000 [IU] | Freq: Once | INTRAVENOUS | Status: AC
  Administered 2019-04-02: 250 [IU] via INTRAVENOUS

## 2019-04-02 MED ORDER — SODIUM CHLORIDE 0.9% FLUSH
10.0000 mL | Freq: Once | INTRAVENOUS | Status: AC
  Administered 2019-04-02: 10 mL via INTRAVENOUS

## 2019-04-02 NOTE — Progress Notes (Signed)
Richard Wallace presents today for PICC line flush and dressing change. VSS. Left upper arm PICC flushed per protocol and dressing changed. See MAR and IV assessment for details. Discharged in satisfactory condition with follow up instructions.

## 2019-04-02 NOTE — Patient Instructions (Signed)
[facility] at [facility]  Discharge Instructions:  PICC line flushed and dressing changed. _______________________________________________________________  Thank you for choosing [facility] at [facility] to provide your oncology and hematology care.  To afford each patient quality time with our providers, please arrive at least 15 minutes before your scheduled appointment.  You need to re-schedule your appointment if you arrive 10 or more minutes late.  We strive to give you quality time with our providers, and arriving late affects you and other patients whose appointments are after yours.  Also, if you no show three or more times for appointments you may be dismissed from the clinic.  Again, thank you for choosing [facility] at [facility]. Our hope is that these requests will allow you access to exceptional care and in a timely manner. _______________________________________________________________  If you have questions after your visit, please contact our office at [phone] between the hours of 8:30 a.m. and 5:00 p.m. Voicemails left after 4:30 p.m. will not be returned until the following business day. _______________________________________________________________  For prescription refill requests, have your pharmacy contact our office. _______________________________________________________________  Recommendations made by the consultant and any test results will be sent to your referring physician. _______________________________________________________________ 

## 2019-04-07 ENCOUNTER — Other Ambulatory Visit: Payer: Self-pay

## 2019-04-07 ENCOUNTER — Ambulatory Visit: Payer: Medicare Other

## 2019-04-07 ENCOUNTER — Ambulatory Visit (INDEPENDENT_AMBULATORY_CARE_PROVIDER_SITE_OTHER): Payer: Medicare Other | Admitting: Podiatry

## 2019-04-07 DIAGNOSIS — L02611 Cutaneous abscess of right foot: Secondary | ICD-10-CM | POA: Diagnosis not present

## 2019-04-07 DIAGNOSIS — L03031 Cellulitis of right toe: Secondary | ICD-10-CM | POA: Diagnosis not present

## 2019-04-07 DIAGNOSIS — M79671 Pain in right foot: Secondary | ICD-10-CM | POA: Diagnosis not present

## 2019-04-07 MED ORDER — CEPHALEXIN 500 MG PO CAPS: 500.0000 mg | ORAL_CAPSULE | Freq: Three times a day (TID) | ORAL | 0 refills | Status: DC

## 2019-04-07 NOTE — Patient Instructions (Signed)

## 2019-04-09 ENCOUNTER — Inpatient Hospital Stay (HOSPITAL_COMMUNITY): Payer: Medicare Other | Attending: Hematology

## 2019-04-09 ENCOUNTER — Other Ambulatory Visit: Payer: Self-pay

## 2019-04-09 DIAGNOSIS — D469 Myelodysplastic syndrome, unspecified: Secondary | ICD-10-CM | POA: Insufficient documentation

## 2019-04-09 DIAGNOSIS — N189 Chronic kidney disease, unspecified: Secondary | ICD-10-CM | POA: Diagnosis not present

## 2019-04-09 DIAGNOSIS — M25569 Pain in unspecified knee: Secondary | ICD-10-CM | POA: Insufficient documentation

## 2019-04-09 DIAGNOSIS — M549 Dorsalgia, unspecified: Secondary | ICD-10-CM | POA: Insufficient documentation

## 2019-04-09 DIAGNOSIS — C9200 Acute myeloblastic leukemia, not having achieved remission: Secondary | ICD-10-CM | POA: Insufficient documentation

## 2019-04-09 DIAGNOSIS — I129 Hypertensive chronic kidney disease with stage 1 through stage 4 chronic kidney disease, or unspecified chronic kidney disease: Secondary | ICD-10-CM | POA: Diagnosis not present

## 2019-04-09 DIAGNOSIS — F17210 Nicotine dependence, cigarettes, uncomplicated: Secondary | ICD-10-CM | POA: Diagnosis not present

## 2019-04-09 DIAGNOSIS — Z5111 Encounter for antineoplastic chemotherapy: Secondary | ICD-10-CM | POA: Insufficient documentation

## 2019-04-09 DIAGNOSIS — Z79899 Other long term (current) drug therapy: Secondary | ICD-10-CM | POA: Insufficient documentation

## 2019-04-09 MED ORDER — SODIUM CHLORIDE 0.9% FLUSH
10.0000 mL | Freq: Once | INTRAVENOUS | Status: AC
  Administered 2019-04-09: 10 mL

## 2019-04-09 MED ORDER — SODIUM CHLORIDE 0.9 % IV SOLN
Freq: Once | INTRAVENOUS | Status: DC
Start: 2019-04-09 — End: 2019-04-09

## 2019-04-09 MED ORDER — HEPARIN SOD (PORK) LOCK FLUSH 100 UNIT/ML IV SOLN
500.0000 [IU] | Freq: Once | INTRAVENOUS | Status: AC
  Administered 2019-04-09: 500 [IU] via INTRAVENOUS

## 2019-04-09 NOTE — Progress Notes (Signed)
Patient presents today for a PICC line dressing change with flush. BP elevated on arrival. Patient denies any headache, blurred vision, or dizziness.  Richard Wallace presented for PICC line flush. PICC located in the Right arm.  Good blood return present. PICC line flushed with 89ml NS and 300U/38ml Heparin. Procedure without incident. Patient tolerated procedure well.

## 2019-04-10 LAB — WOUND CULTURE
MICRO NUMBER:: 1146087
SPECIMEN QUALITY:: ADEQUATE

## 2019-04-11 ENCOUNTER — Other Ambulatory Visit: Payer: Self-pay

## 2019-04-13 ENCOUNTER — Encounter (HOSPITAL_COMMUNITY): Payer: Self-pay | Admitting: Hematology

## 2019-04-13 NOTE — Assessment & Plan Note (Signed)
1.  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  Flow cytometry-12% blasts.  Chromosome analysis and FISH panel normal. -4 cycles of Vidaza from 08/22/2018 through 12/02/2018. -Repeat bone marrow biopsy on 12/23/2018 at [facility] showed 25% blasts and 90% cellular marrow, consistent with AML with MDS related changes.  Flow cytometry showed 11% blasts.  MDS FISH panel was normal. -NGS panel did not show any targetable mutations. -Cycle 1 of decitabine (20 mg/m2) with venetoclax 300 mg daily on 01/20/2019. -He was hospitalized from 02/09/2019 through 02/18/2019 with neutropenic fever.  His venetoclax was held at that time. -His blood cultures grew Streptococcus Infantarius.  His Port-A-Cath was removed on 02/14/2019.  He also had lung infiltrates with cough which resolved.  He had a PICC line placed in the left arm. -We reviewed results of bone marrow biopsy from 03/05/2019 which showed no increase in blasts. -We will start his cycle 3 today.  I will cut back on decitabine 15 mg per metered square. -I will also cut back on venetoclax to 200 mg daily. -We will see him back in 1 week for follow-up.  2.  CKD: -Creatinine improved to 1.03.  3.  Back pain and knee pains: -He takes tramadol 3 times a day along with Tylenol. -He also had injections in both knees and ankle.

## 2019-04-14 ENCOUNTER — Inpatient Hospital Stay (HOSPITAL_COMMUNITY): Payer: Medicare Other

## 2019-04-14 ENCOUNTER — Other Ambulatory Visit: Payer: Self-pay

## 2019-04-14 ENCOUNTER — Inpatient Hospital Stay (HOSPITAL_BASED_OUTPATIENT_CLINIC_OR_DEPARTMENT_OTHER): Payer: Medicare Other | Admitting: Hematology

## 2019-04-14 ENCOUNTER — Ambulatory Visit: Payer: Medicare Other | Admitting: Podiatry

## 2019-04-14 ENCOUNTER — Encounter (HOSPITAL_COMMUNITY): Payer: Self-pay | Admitting: Hematology

## 2019-04-14 VITALS — BP 150/100 | HR 80 | Temp 96.8°F | Resp 18 | Wt 224.4 lb

## 2019-04-14 DIAGNOSIS — C9200 Acute myeloblastic leukemia, not having achieved remission: Secondary | ICD-10-CM | POA: Diagnosis not present

## 2019-04-14 DIAGNOSIS — D61818 Other pancytopenia: Secondary | ICD-10-CM

## 2019-04-14 DIAGNOSIS — Z5111 Encounter for antineoplastic chemotherapy: Secondary | ICD-10-CM | POA: Diagnosis not present

## 2019-04-14 LAB — CBC WITH DIFFERENTIAL/PLATELET
Abs Immature Granulocytes: 0 10*3/uL (ref 0.00–0.07)
Basophils Absolute: 0 10*3/uL (ref 0.0–0.1)
Basophils Relative: 1 %
Eosinophils Absolute: 0 10*3/uL (ref 0.0–0.5)
Eosinophils Relative: 3 %
HCT: 44.9 % (ref 39.0–52.0)
Hemoglobin: 14.2 g/dL (ref 13.0–17.0)
Immature Granulocytes: 0 %
Lymphocytes Relative: 80 %
Lymphs Abs: 1.2 10*3/uL (ref 0.7–4.0)
MCH: 31.4 pg (ref 26.0–34.0)
MCHC: 31.6 g/dL (ref 30.0–36.0)
MCV: 99.3 fL (ref 80.0–100.0)
Monocytes Absolute: 0 10*3/uL — ABNORMAL LOW (ref 0.1–1.0)
Monocytes Relative: 3 %
Neutro Abs: 0.2 10*3/uL — ABNORMAL LOW (ref 1.7–7.7)
Neutrophils Relative %: 13 %
Platelets: 176 10*3/uL (ref 150–400)
RBC: 4.52 MIL/uL (ref 4.22–5.81)
RDW: 15.5 % (ref 11.5–15.5)
WBC: 1.5 10*3/uL — ABNORMAL LOW (ref 4.0–10.5)
nRBC: 0 % (ref 0.0–0.2)

## 2019-04-14 LAB — COMPREHENSIVE METABOLIC PANEL
ALT: 24 U/L (ref 0–44)
AST: 23 U/L (ref 15–41)
Albumin: 3.9 g/dL (ref 3.5–5.0)
Alkaline Phosphatase: 61 U/L (ref 38–126)
Anion gap: 9 (ref 5–15)
BUN: 22 mg/dL (ref 8–23)
CO2: 25 mmol/L (ref 22–32)
Calcium: 9.7 mg/dL (ref 8.9–10.3)
Chloride: 105 mmol/L (ref 98–111)
Creatinine, Ser: 0.96 mg/dL (ref 0.61–1.24)
GFR calc Af Amer: >60 mL/min (ref >60)
GFR calc non Af Amer: >60 mL/min (ref >60)
Glucose, Bld: 106 mg/dL — ABNORMAL HIGH (ref 70–99)
Potassium: 4.6 mmol/L (ref 3.5–5.1)
Sodium: 139 mmol/L (ref 135–145)
Total Bilirubin: 0.6 mg/dL (ref 0.3–1.2)
Total Protein: 6.5 g/dL (ref 6.5–8.1)

## 2019-04-14 MED ORDER — HEPARIN SOD (PORK) LOCK FLUSH 100 UNIT/ML IV SOLN
250.0000 [IU] | Freq: Once | INTRAVENOUS | Status: AC
  Administered 2019-04-14: 250 [IU] via INTRAVENOUS

## 2019-04-14 NOTE — Progress Notes (Signed)
[facility] [address], [place] [address]   CLINIC:  Medical Oncology/Hematology  PCP:  [name], MD [place] [id number] [phone]   REASON FOR VISIT:  Follow-up for newly diagnosed AML.  CURRENT THERAPY: Decitabine and venetoclax  BRIEF ONCOLOGIC HISTORY:  Oncology History  MDS (myelodysplastic syndrome), high grade (HCC)  08/14/2018 Initial Diagnosis   MDS (myelodysplastic syndrome), high grade ([place])   08/22/2018 - 12/31/2018 Chemotherapy   The patient had palonosetron (ALOXI) injection 0.25 mg, 0.25 mg, Intravenous,  Once, 4 of 6 cycles Administration: 0.25 mg (08/22/2018), 0.25 mg (08/26/2018), 0.25 mg (08/28/2018), 0.25 mg (08/30/2018), 0.25 mg (10/01/2018), 0.25 mg (10/02/2018), 0.25 mg (11/04/2018), 0.25 mg (09/23/2018), 0.25 mg (09/25/2018), 0.25 mg (09/27/2018), 0.25 mg (10/28/2018), 0.25 mg (10/30/2018), 0.25 mg (11/01/2018), 0.25 mg (12/02/2018), 0.25 mg (12/04/2018), 0.25 mg (12/06/2018) azaCITIDine (VIDAZA) 100 mg in sodium chloride 0.9 % 50 mL chemo infusion, 110 mg (66.7 % of original dose 75 mg/m2), Intravenous, Once, 4 of 6 cycles Dose modification: 50 mg/m2 (original dose 75 mg/m2, Cycle 1, Reason: Provider Judgment), 50 mg/m2 (original dose 75 mg/m2, Cycle 2, Reason: Provider Judgment) Administration: 100 mg (08/22/2018), 100 mg (08/23/2018), 100 mg (08/26/2018), 100 mg (08/27/2018), 100 mg (08/28/2018), 100 mg (08/29/2018), 100 mg (08/30/2018), 100 mg (10/01/2018), 100 mg (10/02/2018), 100 mg (11/04/2018), 100 mg (11/05/2018), 100 mg (09/23/2018), 100 mg (09/24/2018), 100 mg (09/25/2018), 100 mg (09/26/2018), 100 mg (09/27/2018), 100 mg (10/28/2018), 100 mg (10/29/2018), 100 mg (10/30/2018), 100 mg (10/31/2018), 100 mg (11/01/2018), 100 mg (12/02/2018), 100 mg (12/03/2018), 100 mg (12/04/2018), 100 mg (12/05/2018), 100 mg (12/06/2018)  for chemotherapy treatment.    01/20/2019 -  Chemotherapy   The patient had decitabine (DACOGEN) 45 mg in sodium chloride 0.9 %  50 mL chemo infusion, 20 mg/m2 = 45 mg, Intravenous,  Once, 2 of 4 cycles Dose modification: 15 mg/m2 (original dose 20 mg/m2, Cycle 2, Reason: Other (see comments), Comment: neutropenic fever) Administration: 45 mg (01/20/2019), 45 mg (01/21/2019), 45 mg (01/22/2019), 45 mg (01/23/2019), 45 mg (01/24/2019), 35 mg (03/17/2019), 35 mg (03/18/2019), 35 mg (03/19/2019), 35 mg (03/20/2019), 35 mg (03/21/2019) ondansetron (ZOFRAN) 8 mg in sodium chloride 0.9 % 50 mL IVPB, 8 mg (100 % of original dose 8 mg), Intravenous,  Once, 2 of 4 cycles Dose modification: 8 mg (original dose 8 mg, Cycle 1) Administration: 8 mg (01/20/2019), 8 mg (01/21/2019), 8 mg (01/22/2019), 8 mg (01/23/2019), 8 mg (01/24/2019), 8 mg (03/17/2019), 8 mg (03/18/2019), 8 mg (03/19/2019), 8 mg (03/20/2019), 8 mg (03/21/2019)  for chemotherapy treatment.    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[place])   01/20/2019 -  Chemotherapy   The patient had decitabine (DACOGEN) 45 mg in sodium chloride 0.9 % 50 mL chemo infusion, 20 mg/m2 = 45 mg, Intravenous,  Once, 2 of 4 cycles Dose modification: 15 mg/m2 (original dose 20 mg/m2, Cycle 2, Reason: Other (see comments), Comment: neutropenic fever) Administration: 45 mg (01/20/2019), 45 mg (01/21/2019), 45 mg (01/22/2019), 45 mg (01/23/2019), 45 mg (01/24/2019), 35 mg (03/17/2019), 35 mg (03/18/2019), 35 mg (03/19/2019), 35 mg (03/20/2019), 35 mg (03/21/2019) ondansetron (ZOFRAN) 8 mg in sodium chloride 0.9 % 50 mL IVPB, 8 mg (100 % of original dose 8 mg), Intravenous,  Once, 2 of 4 cycles Dose modification: 8 mg (original dose 8 mg, Cycle 1) Administration: 8 mg (01/20/2019), 8 mg (01/21/2019), 8 mg (01/22/2019), 8 mg (01/23/2019), 8 mg (01/24/2019),  8 mg (03/17/2019), 8 mg (03/18/2019), 8 mg (03/19/2019), 8 mg (03/20/2019), 8 mg (03/21/2019)  for chemotherapy treatment.       CANCER STAGING: Cancer Staging No matching staging information was  found for the patient.   INTERVAL HISTORY:  [name] 73 y.o. male seen for follow-up of AML and to initiate cycle 3 of decitabine and venetoclax.  He received cycle 2 on 03/17/2019.  Did not have any nausea, vomiting, diarrhea associated with it.  Appetite is 100%.  Energy levels are 50%.  He reportedly developed right great toe nail infection and had to be removed by podiatrist.  He reports swelling of the right leg more than the left leg.  Denies any lightheadedness.  No fevers or chills reported.  REVIEW OF SYSTEMS:  Review of Systems  Cardiovascular: Positive for leg swelling.  Musculoskeletal: Positive for arthralgias.  All other systems reviewed and are negative.    PAST MEDICAL/SURGICAL HISTORY:  Past Medical History:  Diagnosis Date  . Arthritis   . 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 Cancer ([place]) 01-28-2014   skin cancer  . CKD (chronic kidney disease), stage III   . GERD (gastroesophageal reflux disease)   . Heart murmur    NOTED DURING PHYSICAL WHEN HE WAS ENLISTING IN [facility] , DIDNT KNOW UNTIL THAT TIME AND REPORTS , "THATS THE LAST I HEARD ABOUT IT "   . History of hypertension    no longer issue  . History of kidney stones   . History of malignant melanoma of skin    excision top of scalp 2015-- no recurrence  . History of urinary retention    post op lumbar fusion surgery 04/ 2016  . Hypertension   . 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 Left ureteral calculus   . Seasonal allergies   . Wears glasses   . Wears glasses   . Wears partial dentures    upper and lower   Past Surgical History:  Procedure Laterality Date  . ANKLE FUSION Right 2007  . CARPAL TUNNEL RELEASE Left 12/28/2009   w/ pulley release left long finger  . CARPAL TUNNEL RELEASE Right 07/22/2013   Procedure: RIGHT CARPAL TUNNEL RELEASE;  Surgeon: Cammie Sickle., MD;  Location: [facility];  Service: Orthopedics;  Laterality: Right;  . COLONOSCOPY    . CYSTO/  LEFT RETROGRADE PYELOGRAM  11/21/2010  . CYSTOSCOPY WITH STENT PLACEMENT Left 03/09/2016   Procedure: CYSTOSCOPY WITH STENT PLACEMENT;  Surgeon: Franchot Gallo, MD;  Location: [facility];  Service: Urology;  Laterality: Left;  . CYSTOSCOPY/RETROGRADE/URETEROSCOPY/STONE EXTRACTION WITH BASKET Left 03/09/2016   Procedure: CYSTOSCOPY/RETROGRADE/URETEROSCOPY/STONE EXTRACTION WITH BASKET;  Surgeon: Franchot Gallo, MD;  Location[facility];  Service: Urology;  Laterality: Left;  . LEFT URETEROSCOPIC LASER LITHOTRIPSY STONE EXTRACTION/ STENT PLACEMENT  05/23/2010  . MOHS SURGERY     TOP OF THE HEAD   . ORIF ANKLE FRACTURE Right 1978  . PORT-A-CATH REMOVAL Right 02/14/2019   Procedure: MINOR REMOVAL PORT-A-CATH;  Surgeon: Aviva Signs, MD;  Location: [facility];  Service: General;  Laterality: Right;  . PORTACATH PLACEMENT Right 08/19/2018   Procedure: INSERTION PORT-A-CATH (attached catheter in right subclavian);  Surgeon: Aviva Signs, MD;  Location: [facility];  Service: General;  Laterality: Right;  . POSTERIOR LUMBAR FUSION  08/21/2014   laminectomy and decompression L2 -- L5  .  RIGHT LOWER LEG SURGERY  X3  1975 to 1976   including ORIF  . TONSILLECTOMY AND ADENOIDECTOMY  1986  . UMBILICAL HERNIA REPAIR  2009 approx     SOCIAL HISTORY:  Social History   Socioeconomic History  . Marital status: Married    Spouse name: Not on file  . Number of children: Not on file  . Years of education: Not on file  . Highest education level: Not on file  Occupational History  . Not on file  Social Needs  . Financial resource strain: Patient refused  . Food insecurity    Worry: Patient refused    Inability: Patient refused  . Transportation needs    Medical: Patient refused    Non-medical: Patient refused  Tobacco Use  . Smoking status: Former Smoker    Years: 20.00    Types:  Cigarettes    Quit date: 07/17/1986    Years since quitting: 32.7  . Smokeless tobacco: Never Used  Substance and Sexual Activity  . Alcohol use: Yes    Alcohol/week: 7.0 - 14.0 standard drinks    Types: 7 - 14 Cans of beer per week    Comment: 1 -2 beer daily  . Drug use: No  . Sexual activity: Not Currently  Lifestyle  . Physical activity    Days per week: Patient refused    Minutes per session: Patient refused  . Stress: Patient refused  Relationships  . Social Herbalist on phone: Patient refused    Gets together: Patient refused    Attends religious service: Patient refused    Active member of club or organization: Patient refused    Attends meetings of clubs or organizations: Patient refused    Relationship status: Patient refused  . Intimate partner violence    Fear of current or ex partner: Patient refused    Emotionally abused: Patient refused    Physically abused: Patient refused    Forced sexual activity: Patient refused  Other Topics Concern  . Not on file  Social History Narrative  . Not on file    FAMILY HISTORY:  Family History  Problem Relation Age of Onset  . Stroke Mother   . Prostate cancer Father   . Bone cancer Father   . Diverticulitis Father   . Rheum arthritis Sister   . Urinary tract infection Sister   . Colon cancer Neg Hx     CURRENT MEDICATIONS:  Outpatient Encounter Medications as of 04/14/2019  Medication Sig  . allopurinol (ZYLOPRIM) 300 MG tablet Take 300 mg by mouth every evening.  . cephALEXin (KEFLEX) 500 MG capsule Take 1 capsule (500 mg total) by mouth 3 (three) times daily.  . cetirizine (ZYRTEC) 10 MG tablet Take 10 mg by mouth daily. IN THE MORNING  . docusate sodium (COLACE) 100 MG capsule Take 100 mg by mouth at bedtime.   . lansoprazole (PREVACID) 15 MG capsule Take 15 mg by mouth daily.   . magnesium oxide (MAG-OX) 400 MG tablet Take 400 mg by mouth at bedtime.   . tamsulosin (FLOMAX) 0.4 MG CAPS capsule Take  0.4 mg by mouth daily.  Marland Kitchen acetaminophen (TYLENOL) 500 MG tablet Take 1,000 mg by mouth every 6 (six) hours as needed for mild pain or moderate pain.   Marland Kitchen diclofenac sodium (VOLTAREN) 1 % GEL Apply 1 g topically 3 (three) times daily as needed (knee pain.).   Marland Kitchen lidocaine-prilocaine (EMLA) cream APPLY A PEA SIZED AMOUNT TO PORT A CATH  SITE AND COVER WITH PLASTIC WRAP ONE HOUR PRIOR TO CHEMOTHERAPY APPOINTMENT  . traMADol (ULTRAM) 50 MG tablet TAKE 1 TABLET BY MOUTH THREE TIMES DAILY AS NEEDED FOR PAIN (Patient not taking: Reported on 04/14/2019)  . venetoclax 100 MG TABS Take 200 mg by mouth daily. (Patient not taking: Reported on 04/14/2019)   No facility-administered encounter medications on file as of 04/14/2019.     ALLERGIES:  No Known Allergies   PHYSICAL EXAM:  ECOG Performance status: 1  Vitals:   04/14/19 0825 04/14/19 0840  BP: (!) 150/100 (!) 150/100  Pulse: 80 80  Resp: 18 18  Temp: (!) 96.8 F (36 C) (!) 96.8 F (36 C)  SpO2: 97% 97%   Filed Weights   04/14/19 0825 04/14/19 0840  Weight: 224 lb 6.4 oz (101.8 kg) 224 lb 6.4 oz (101.8 kg)    Physical Exam Constitutional:      Appearance: Normal appearance.  HENT:     Mouth/Throat:     Mouth: Mucous membranes are moist.     Pharynx: Oropharynx is clear.  Eyes:     Extraocular Movements: Extraocular movements intact.     Conjunctiva/sclera: Conjunctivae normal.  Neck:     Musculoskeletal: Normal range of motion.  Cardiovascular:     Rate and Rhythm: Normal rate and regular rhythm.     Heart sounds: Normal heart sounds.  Pulmonary:     Effort: Pulmonary effort is normal.     Breath sounds: Normal breath sounds.  Abdominal:     General: There is no distension.     Palpations: Abdomen is soft. There is no mass.  Musculoskeletal: Normal range of motion.        General: No deformity.  Skin:    General: Skin is warm.  Neurological:     General: No focal deficit present.     Mental Status: He is alert and  oriented to person, place, and time.  Psychiatric:        Mood and Affect: Mood normal.        Behavior: Behavior normal.      LABORATORY DATA:  I have reviewed the labs as listed.  CBC    Component Value Date/Time   WBC 1.5 (L) 04/14/2019 0811   RBC 4.52 04/14/2019 0811   HGB 14.2 04/14/2019 0811   HCT 44.9 04/14/2019 0811   PLT 176 04/14/2019 0811   MCV 99.3 04/14/2019 0811   MCH 31.4 04/14/2019 0811   MCHC 31.6 04/14/2019 0811   RDW 15.5 04/14/2019 0811   LYMPHSABS 1.2 04/14/2019 0811   MONOABS 0.0 (L) 04/14/2019 0811   EOSABS 0.0 04/14/2019 0811   BASOSABS 0.0 04/14/2019 0811   CMP Latest Ref Rng & Units 04/14/2019 03/27/2019 03/17/2019  Glucose 70 - 99 mg/dL 106(H) 99 119(H)  BUN 8 - 23 mg/dL '22 23 19  '$ Creatinine 0.61 - 1.24 mg/dL 0.96 0.96 1.03  Sodium 135 - 145 mmol/L 139 139 140  Potassium 3.5 - 5.1 mmol/L 4.6 4.4 4.1  Chloride 98 - 111 mmol/L 105 105 106  CO2 22 - 32 mmol/L '25 25 27  '$ Calcium 8.9 - 10.3 mg/dL 9.7 9.4 9.4  Total Protein 6.5 - 8.1 g/dL 6.5 6.4(L) 6.5  Total Bilirubin 0.3 - 1.2 mg/dL 0.6 1.0 0.7  Alkaline Phos 38 - 126 U/L 61 56 60  AST 15 - 41 U/L '23 25 22  '$ ALT 0 - 44 U/L '24 26 20        '$ ASSESSMENT & PLAN:  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[place]) 1.  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  Flow cytometry-12% blasts.  Chromosome analysis and FISH panel normal. -4 cycles of Vidaza from 08/22/2018 through 12/02/2018. -Repeat bone marrow biopsy on 12/23/2018 at [facility] showed 25% blasts and 90% cellular marrow, consistent with AML with MDS related changes.  Flow cytometry showed 11% blasts.  MDS FISH panel was normal. -NGS panel did not show any targetable mutations. -Cycle 1 of decitabine (20 mg/m2) with venetoclax 300 mg daily on 01/20/2019. -He was hospitalized from 02/09/2019 through 02/18/2019 with neutropenic fever.  His venetoclax was held at that time. -His blood cultures grew Streptococcus Infantarius.   His Port-A-Cath was removed on 02/14/2019.  He also had lung infiltrates with cough which resolved.  He had a PICC line placed in the left arm. -Cycle 2 of decitabine (15 mg/M 2) on 03/17/2019.  Venetoclax was dose reduced to 200 mg, took for 2 weeks until 03/31/2019. -He did not experience any major side effects.  He had developed right great toe infection and toenail had to be removed.  He had some right leg swelling as result of it. -We have reviewed his labs today.  White count is 1.5 with ANC of 400.  I will hold off on his treatment today. -I will see him back in 1 week and reevaluate him with labs and treatment.  We will restart venetoclax with decitabine.  2.  CKD: -Creatinine today 0.94.  3.  Bilateral knee pains: -He will continue tramadol 3 times a day along with Tylenol.    Orders placed this encounter:  Orders Placed This Encounter  Procedures  . CBC with Differential/Platelet  . Comprehensive metabolic panel  . Magnesium      [name], MD [place] [phone]

## 2019-04-14 NOTE — Assessment & Plan Note (Signed)
1.  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  Flow cytometry-12% blasts.  Chromosome analysis and FISH panel normal. -4 cycles of Vidaza from 08/22/2018 through 12/02/2018. -Repeat bone marrow biopsy on 12/23/2018 at [facility] showed 25% blasts and 90% cellular marrow, consistent with AML with MDS related changes.  Flow cytometry showed 11% blasts.  MDS FISH panel was normal. -NGS panel did not show any targetable mutations. -Cycle 1 of decitabine (20 mg/m2) with venetoclax 300 mg daily on 01/20/2019. -He was hospitalized from 02/09/2019 through 02/18/2019 with neutropenic fever.  His venetoclax was held at that time. -His blood cultures grew Streptococcus Infantarius.  His Port-A-Cath was removed on 02/14/2019.  He also had lung infiltrates with cough which resolved.  He had a PICC line placed in the left arm. -Cycle 2 of decitabine (15 mg/M 2) on 03/17/2019.  Venetoclax was dose reduced to 200 mg, took for 2 weeks until 03/31/2019. -He did not experience any major side effects.  He had developed right great toe infection and toenail had to be removed.  He had some right leg swelling as result of it. -We have reviewed his labs today.  White count is 1.5 with ANC of 400.  I will hold off on his treatment today. -I will see him back in 1 week and reevaluate him with labs and treatment.  We will restart venetoclax with decitabine.  2.  CKD: -Creatinine today 0.94.  3.  Bilateral knee pains: -He will continue tramadol 3 times a day along with Tylenol.

## 2019-04-14 NOTE — Patient Instructions (Addendum)
[place] at [facility] Discharge Instructions  You were seen today by Dr. Delton Coombes. He went over your recent lab results. He will hold your treatment today, please hold the venetoclax until seeing [place] next week. He will see you back in 1 week for labs, treatment and follow up.   Thank you for choosing [place] at [facility] to provide your oncology and hematology care.  To afford each patient quality time with our provider, please arrive at least 15 minutes before your scheduled appointment time.   If you have a lab appointment with the [facility] please come in thru the  Main Entrance and check in at the main information desk  You need to re-schedule your appointment should you arrive 10 or more minutes late.  We strive to give you quality time with our providers, and arriving late affects you and other patients whose appointments are after yours.  Also, if you no show three or more times for appointments you may be dismissed from the clinic at the providers discretion.     Again, thank you for choosing [facility].  Our hope is that these requests will decrease the amount of time that you wait before being seen by our physicians.       _____________________________________________________________  Should you have questions after your visit to [facility], please contact our office at [phone] between the hours of 8:00 a.m. and 4:30 p.m.  Voicemails left after 4:00 p.m. will not be returned until the following business day.  For prescription refill requests, have your pharmacy contact our office and allow 72 hours.    Cancer Center Support Programs:   > Cancer Support Group  2nd Tuesday of the month 1pm-2pm, Journey Room

## 2019-04-14 NOTE — Progress Notes (Signed)
Per Dr. Delton Coombes, treatment to be held today due to [place] 0.2. Rescheduled for next week. Pt and wife aware.

## 2019-04-14 NOTE — Progress Notes (Signed)
Subjective:   Patient ID: Richard Wallace, male   DOB: 73 y.o.   MRN: [id number]   HPI 73 year old male presents the office today for concerns of pain to his right big toe possible ingrown as well as infection to the toenail.  Patient has redness and swelling on the nail.  No red streaks.  Nails tender to touch.  No recent treatment.  No other concerns.   Review of Systems  All other systems reviewed and are negative.  Past Medical History:  Diagnosis Date  . Arthritis   . 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 Cancer ([place]) 01-28-2014   skin cancer  . CKD (chronic kidney disease), stage III   . GERD (gastroesophageal reflux disease)   . Heart murmur    NOTED DURING PHYSICAL WHEN HE WAS ENLISTING IN [facility] , DIDNT KNOW UNTIL THAT TIME AND REPORTS , "THATS THE LAST I HEARD ABOUT IT "   . History of hypertension    no longer issue  . History of kidney stones   . History of malignant melanoma of skin    excision top of scalp 2015-- no recurrence  . History of urinary retention    post op lumbar fusion surgery 04/ 2016  . Hypertension   . 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 Left ureteral calculus   . Seasonal allergies   . Wears glasses   . Wears glasses   . Wears partial dentures    upper and lower    Past Surgical History:  Procedure Laterality Date  . ANKLE FUSION Right 2007  . CARPAL TUNNEL RELEASE Left 12/28/2009   w/ pulley release left long finger  . CARPAL TUNNEL RELEASE Right 07/22/2013   Procedure: RIGHT CARPAL TUNNEL RELEASE;  Surgeon: Cammie Sickle., MD;  Location: [facility];  Service: Orthopedics;  Laterality: Right;  . COLONOSCOPY    . CYSTO/  LEFT RETROGRADE PYELOGRAM  11/21/2010  . CYSTOSCOPY WITH STENT PLACEMENT Left 03/09/2016   Procedure: CYSTOSCOPY WITH STENT PLACEMENT;  Surgeon: Franchot Gallo, MD;  Location: [facility];  Service:  Urology;  Laterality: Left;  . CYSTOSCOPY/RETROGRADE/URETEROSCOPY/STONE EXTRACTION WITH BASKET Left 03/09/2016   Procedure: CYSTOSCOPY/RETROGRADE/URETEROSCOPY/STONE EXTRACTION WITH BASKET;  Surgeon: Franchot Gallo, MD;  Location: [facility];  Service: Urology;  Laterality: Left;  . LEFT URETEROSCOPIC LASER LITHOTRIPSY STONE EXTRACTION/ STENT PLACEMENT  05/23/2010  . MOHS SURGERY     TOP OF THE HEAD   . ORIF ANKLE FRACTURE Right 1978  . PORT-A-CATH REMOVAL Right 02/14/2019   Procedure: MINOR REMOVAL PORT-A-CATH;  Surgeon: Aviva Signs, MD;  Location: [facility];  Service: General;  Laterality: Right;  . PORTACATH PLACEMENT Right 08/19/2018   Procedure: INSERTION PORT-A-CATH (attached catheter in right subclavian);  Surgeon: Aviva Signs, MD;  Location: [facility];  Service: General;  Laterality: Right;  . POSTERIOR LUMBAR FUSION  08/21/2014   laminectomy and decompression L2 -- L5  . RIGHT LOWER LEG SURGERY  X3  1975 to 1976   including ORIF  . TONSILLECTOMY AND ADENOIDECTOMY  1986  . UMBILICAL HERNIA REPAIR  2009 approx     Current Outpatient Medications:  .  acetaminophen (TYLENOL) 500 MG tablet, Take 1,000 mg by mouth every 6 (six) hours as needed for mild pain or moderate pain. , Disp: , Rfl:  .  allopurinol (ZYLOPRIM) 300 MG tablet, Take 300 mg by mouth  every evening., Disp: , Rfl:  .  cetirizine (ZYRTEC) 10 MG tablet, Take 10 mg by mouth daily. IN THE MORNING, Disp: , Rfl:  .  diclofenac sodium (VOLTAREN) 1 % GEL, Apply 1 g topically 3 (three) times daily as needed (knee pain.). , Disp: , Rfl:  .  docusate sodium (COLACE) 100 MG capsule, Take 100 mg by mouth at bedtime. , Disp: , Rfl:  .  lansoprazole (PREVACID) 15 MG capsule, Take 15 mg by mouth daily. , Disp: , Rfl:  .  lidocaine-prilocaine (EMLA) cream, APPLY A PEA SIZED AMOUNT TO PORT A CATH SITE AND COVER WITH PLASTIC WRAP ONE HOUR PRIOR TO CHEMOTHERAPY APPOINTMENT, Disp: , Rfl:  .  magnesium oxide (MAG-OX) 400 MG  tablet, Take 400 mg by mouth at bedtime. , Disp: , Rfl:  .  tamsulosin (FLOMAX) 0.4 MG CAPS capsule, Take 0.4 mg by mouth daily., Disp: , Rfl:  .  traMADol (ULTRAM) 50 MG tablet, TAKE 1 TABLET BY MOUTH THREE TIMES DAILY AS NEEDED FOR PAIN, Disp: 90 tablet, Rfl: 0 .  venetoclax 100 MG TABS, Take 200 mg by mouth daily., Disp: 60 tablet, Rfl: 0 .  cephALEXin (KEFLEX) 500 MG capsule, Take 1 capsule (500 mg total) by mouth 3 (three) times daily., Disp: 21 capsule, Rfl: 0  No Known Allergies       Objective:  Physical Exam  General: AAO x3, NAD  Dermatological: Right hallux toenails hypertrophic, dystrophic.  There is surrounding edema and erythema to the nail but no ascending cellulitis there is no purulence identified a small amount of clear drainage.  Vascular: Dorsalis Pedis artery and Posterior Tibial artery pedal pulses are 2/4 bilateral with immedate capillary fill time. There is no pain with calf compression, swelling, warmth, erythema.   Neruologic: Grossly intact via light touch bilateral.  Protective threshold with Semmes Wienstein monofilament intact to all pedal sites bilateral.   Musculoskeletal: No gross boney pedal deformities bilateral. No pain, crepitus, or limitation noted with foot and ankle range of motion bilateral. Muscular strength 5/5 in all groups tested bilateral.  Gait: Unassisted, Nonantalgic.       Assessment:   73 year old male right hallux toenail infection    Plan:  -Treatment options discussed including all alternatives, risks, and complications -Etiology of symptoms were discussed -At this time, recommended total nail removal without chemical matricectomy to the right hallux due to infection. Risks and complications were discussed with the patient for which they understand and  verbally consent to the procedure. Under sterile conditions a total of 3 mL of a mixture of 2% lidocaine plain and 0.5% Marcaine plain was infiltrated in a hallux block fashion.  Once anesthetized, the skin was prepped in sterile fashion. A tourniquet was then applied. Next the righthallux nail was sharply excised making sure to remove the entire offending nail border. Once the nail was  Removed, the area was debrided and the underlying skin was intact. The area was irrigated and hemostasis was obtained.  A dry sterile dressing was applied. After application of the dressing the tourniquet was removed and there is found to be an immediate capillary refill time to the digit. The patient tolerated the procedure well any complications. Post procedure instructions were discussed the patient for which he verbally understood. Follow-up in one week for nail check or sooner if any problems are to arise. Discussed signs/symptoms of worsening infection and directed to call the office immediately should any occur or go directly to the emergency room. In the  meantime, encouraged to call the office with any questions, concerns, changes symptoms. -Wound culture obtained -Keflex  Return in about 1 week (around 04/14/2019).  Trula Slade DPM

## 2019-04-15 ENCOUNTER — Ambulatory Visit (HOSPITAL_COMMUNITY): Payer: Medicare Other

## 2019-04-16 ENCOUNTER — Ambulatory Visit (HOSPITAL_COMMUNITY): Payer: Medicare Other

## 2019-04-17 ENCOUNTER — Ambulatory Visit (HOSPITAL_COMMUNITY): Payer: Medicare Other

## 2019-04-18 ENCOUNTER — Ambulatory Visit (HOSPITAL_COMMUNITY): Payer: Medicare Other

## 2019-04-19 ENCOUNTER — Other Ambulatory Visit (HOSPITAL_COMMUNITY): Payer: Self-pay | Admitting: Nurse Practitioner

## 2019-04-19 DIAGNOSIS — M25559 Pain in unspecified hip: Secondary | ICD-10-CM

## 2019-04-21 ENCOUNTER — Other Ambulatory Visit: Payer: Self-pay

## 2019-04-21 ENCOUNTER — Encounter (HOSPITAL_COMMUNITY): Payer: Self-pay | Admitting: Hematology

## 2019-04-21 ENCOUNTER — Inpatient Hospital Stay (HOSPITAL_BASED_OUTPATIENT_CLINIC_OR_DEPARTMENT_OTHER): Payer: Medicare Other | Admitting: Hematology

## 2019-04-21 ENCOUNTER — Inpatient Hospital Stay (HOSPITAL_COMMUNITY): Payer: Medicare Other

## 2019-04-21 VITALS — BP 115/64 | HR 88 | Temp 97.7°F | Resp 18 | Wt 223.6 lb

## 2019-04-21 DIAGNOSIS — D709 Neutropenia, unspecified: Secondary | ICD-10-CM

## 2019-04-21 DIAGNOSIS — C9200 Acute myeloblastic leukemia, not having achieved remission: Secondary | ICD-10-CM

## 2019-04-21 DIAGNOSIS — Z5111 Encounter for antineoplastic chemotherapy: Secondary | ICD-10-CM | POA: Diagnosis not present

## 2019-04-21 LAB — MAGNESIUM: Magnesium: 1.6 mg/dL — ABNORMAL LOW (ref 1.7–2.4)

## 2019-04-21 LAB — CBC WITH DIFFERENTIAL/PLATELET
Abs Immature Granulocytes: 0.01 10*3/uL (ref 0.00–0.07)
Basophils Absolute: 0 10*3/uL (ref 0.0–0.1)
Basophils Relative: 1 %
Eosinophils Absolute: 0.1 10*3/uL (ref 0.0–0.5)
Eosinophils Relative: 4 %
HCT: 45.7 % (ref 39.0–52.0)
Hemoglobin: 14.3 g/dL (ref 13.0–17.0)
Immature Granulocytes: 1 %
Lymphocytes Relative: 62 %
Lymphs Abs: 1.2 10*3/uL (ref 0.7–4.0)
MCH: 31.2 pg (ref 26.0–34.0)
MCHC: 31.3 g/dL (ref 30.0–36.0)
MCV: 99.8 fL (ref 80.0–100.0)
Monocytes Absolute: 0.4 10*3/uL (ref 0.1–1.0)
Monocytes Relative: 20 %
Neutro Abs: 0.2 10*3/uL — ABNORMAL LOW (ref 1.7–7.7)
Neutrophils Relative %: 12 %
Platelets: 130 10*3/uL — ABNORMAL LOW (ref 150–400)
RBC: 4.58 MIL/uL (ref 4.22–5.81)
RDW: 15.4 % (ref 11.5–15.5)
WBC: 2 10*3/uL — ABNORMAL LOW (ref 4.0–10.5)
nRBC: 0 % (ref 0.0–0.2)

## 2019-04-21 LAB — COMPREHENSIVE METABOLIC PANEL
ALT: 23 U/L (ref 0–44)
AST: 21 U/L (ref 15–41)
Albumin: 3.7 g/dL (ref 3.5–5.0)
Alkaline Phosphatase: 61 U/L (ref 38–126)
Anion gap: 9 (ref 5–15)
BUN: 24 mg/dL — ABNORMAL HIGH (ref 8–23)
CO2: 26 mmol/L (ref 22–32)
Calcium: 9.7 mg/dL (ref 8.9–10.3)
Chloride: 107 mmol/L (ref 98–111)
Creatinine, Ser: 1.11 mg/dL (ref 0.61–1.24)
GFR calc Af Amer: >60 mL/min (ref >60)
GFR calc non Af Amer: >60 mL/min (ref >60)
Glucose, Bld: 114 mg/dL — ABNORMAL HIGH (ref 70–99)
Potassium: 3.9 mmol/L (ref 3.5–5.1)
Sodium: 142 mmol/L (ref 135–145)
Total Bilirubin: 0.6 mg/dL (ref 0.3–1.2)
Total Protein: 6.4 g/dL — ABNORMAL LOW (ref 6.5–8.1)

## 2019-04-21 MED ORDER — SODIUM CHLORIDE 0.9% FLUSH
10.0000 mL | Freq: Once | INTRAVENOUS | Status: AC
  Administered 2019-04-21: 10 mL

## 2019-04-21 MED ORDER — HEPARIN SOD (PORK) LOCK FLUSH 100 UNIT/ML IV SOLN
500.0000 [IU] | Freq: Once | INTRAVENOUS | Status: AC
  Administered 2019-04-21: 500 [IU] via INTRAVENOUS

## 2019-04-21 MED ORDER — FILGRASTIM-SNDZ 480 MCG/0.8ML IJ SOSY
480.0000 ug | PREFILLED_SYRINGE | Freq: Once | INTRAMUSCULAR | Status: AC
  Administered 2019-04-21: 480 ug via SUBCUTANEOUS
  Filled 2019-04-21: qty 0.8

## 2019-04-21 NOTE — Patient Instructions (Addendum)
[place] at [facility] Discharge Instructions  You were seen today by Dr. Delton Coombes. He went over your recent lab results. Neupogen injection today, come back tomorrow for treatment. He will see you back in 1 week for labs and follow up.   Thank you for choosing [place] at [facility] to provide your oncology and hematology care.  To afford each patient quality time with our provider, please arrive at least 15 minutes before your scheduled appointment time.   If you have a lab appointment with the [facility] please come in thru the  Main Entrance and check in at the main information desk  You need to re-schedule your appointment should you arrive 10 or more minutes late.  We strive to give you quality time with our providers, and arriving late affects you and other patients whose appointments are after yours.  Also, if you no show three or more times for appointments you may be dismissed from the clinic at the providers discretion.     Again, thank you for choosing [facility].  Our hope is that these requests will decrease the amount of time that you wait before being seen by our physicians.       _____________________________________________________________  Should you have questions after your visit to [facility], please contact our office at [phone] between the hours of 8:00 a.m. and 4:30 p.m.  Voicemails left after 4:00 p.m. will not be returned until the following business day.  For prescription refill requests, have your pharmacy contact our office and allow 72 hours.    Cancer Center Support Programs:   > Cancer Support Group  2nd Tuesday of the month 1pm-2pm, Journey Room

## 2019-04-21 NOTE — Patient Instructions (Signed)
[facility] at [facility]  Discharge Instructions:   _______________________________________________________________  Thank you for choosing [facility] at [facility] to provide your oncology and hematology care.  To afford each patient quality time with our providers, please arrive at least 15 minutes before your scheduled appointment.  You need to re-schedule your appointment if you arrive 10 or more minutes late.  We strive to give you quality time with our providers, and arriving late affects you and other patients whose appointments are after yours.  Also, if you no show three or more times for appointments you may be dismissed from the clinic.  Again, thank you for choosing [facility] at [facility]. Our hope is that these requests will allow you access to exceptional care and in a timely manner. _______________________________________________________________  If you have questions after your visit, please contact our office at [phone] between the hours of 8:30 a.m. and 5:00 p.m. Voicemails left after 4:30 p.m. will not be returned until the following business day. _______________________________________________________________  For prescription refill requests, have your pharmacy contact our office. _______________________________________________________________  Recommendations made by the consultant and any test results will be sent to your referring physician. _______________________________________________________________ 

## 2019-04-21 NOTE — Assessment & Plan Note (Addendum)
1.  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  Flow cytometry-12% blasts.  Chromosome analysis and FISH panel normal. -4 cycles of Vidaza from 08/22/2018 through 12/02/2018. -Repeat bone marrow biopsy on 12/23/2018 at [facility] showed 25% blasts and 90% cellular marrow, consistent with AML with MDS related changes.  Flow cytometry showed 11% blasts.  MDS FISH panel was normal. -NGS panel did not show any targetable mutations. -Cycle 1 of decitabine (20 mg/m2) with venetoclax 300 mg daily on 01/20/2019. -He was hospitalized from 02/09/2019 through 02/18/2019 with neutropenic fever.  His venetoclax was held at that time. -His blood cultures grew Streptococcus Infantarius.  His Port-A-Cath was removed on 02/14/2019.  He also had lung infiltrates with cough which resolved.  He had a PICC line placed in the left arm. -Cycle 2 of decitabine (15 mg/M 2) on 03/17/2019.  Venetoclax was dose reduced to 200 mg, took for 2 weeks until 03/31/2019. -His chemotherapy last week was held as his ANC was 200. -We reviewed his labs today.  White count is 2.0 with ANC of 200.  Hemoglobin is 14.3 and platelet count is 130. -I have recommended G-CSF 480 mcg today.  We will check his CBC tomorrow and will likely proceed with his cycle 3 tomorrow through Friday for 4 days. -He will also start venetoclax along with decitabine.  He will continue venetoclax 200 mg for 2 weeks. -I plan to evaluate him in 1 week with labs..  2.  CKD: -Creatinine slightly increased to 1.11.  3.  Bilateral knee pains: -He is taking tramadol 3 times a day along with Tylenol which is helping.  4.  Hypomagnesemia: -His magnesium today is 1.6.  He is taking magnesium once daily.  We will closely monitor it.  If it continues to be low, we will consider increasing magnesium to twice daily.

## 2019-04-21 NOTE — Progress Notes (Signed)
Patient presents today for treatment and follow up visit with Dr. Delton Coombes. Labs reviewed. ANC 0.2.  MAR updated.   Message received from [facility] LPN. Hold treatment today and administer Zarxio. Patient to return tomorrow for treatment and draw cbc/d.    Vital signs stable. No complaints at this time. Discharged from clinic ambulatory. F/U with [facility] as scheduled.

## 2019-04-21 NOTE — Progress Notes (Signed)
[facility] [address], [place] [address]   CLINIC:  Medical Oncology/Hematology  PCP:  [name], MD [place] [address] [phone]   REASON FOR VISIT:  Follow-up for newly diagnosed AML.  CURRENT THERAPY: Decitabine and venetoclax  BRIEF ONCOLOGIC HISTORY:  Oncology History  MDS (myelodysplastic syndrome), high grade (HCC)  08/14/2018 Initial Diagnosis   MDS (myelodysplastic syndrome), high grade ([place])   08/22/2018 - 12/31/2018 Chemotherapy   The patient had palonosetron (ALOXI) injection 0.25 mg, 0.25 mg, Intravenous,  Once, 4 of 6 cycles Administration: 0.25 mg (08/22/2018), 0.25 mg (08/26/2018), 0.25 mg (08/28/2018), 0.25 mg (08/30/2018), 0.25 mg (10/01/2018), 0.25 mg (10/02/2018), 0.25 mg (11/04/2018), 0.25 mg (09/23/2018), 0.25 mg (09/25/2018), 0.25 mg (09/27/2018), 0.25 mg (10/28/2018), 0.25 mg (10/30/2018), 0.25 mg (11/01/2018), 0.25 mg (12/02/2018), 0.25 mg (12/04/2018), 0.25 mg (12/06/2018) azaCITIDine (VIDAZA) 100 mg in sodium chloride 0.9 % 50 mL chemo infusion, 110 mg (66.7 % of original dose 75 mg/m2), Intravenous, Once, 4 of 6 cycles Dose modification: 50 mg/m2 (original dose 75 mg/m2, Cycle 1, Reason: Provider Judgment), 50 mg/m2 (original dose 75 mg/m2, Cycle 2, Reason: Provider Judgment) Administration: 100 mg (08/22/2018), 100 mg (08/23/2018), 100 mg (08/26/2018), 100 mg (08/27/2018), 100 mg (08/28/2018), 100 mg (08/29/2018), 100 mg (08/30/2018), 100 mg (10/01/2018), 100 mg (10/02/2018), 100 mg (11/04/2018), 100 mg (11/05/2018), 100 mg (09/23/2018), 100 mg (09/24/2018), 100 mg (09/25/2018), 100 mg (09/26/2018), 100 mg (09/27/2018), 100 mg (10/28/2018), 100 mg (10/29/2018), 100 mg (10/30/2018), 100 mg (10/31/2018), 100 mg (11/01/2018), 100 mg (12/02/2018), 100 mg (12/03/2018), 100 mg (12/04/2018), 100 mg (12/05/2018), 100 mg (12/06/2018)  for chemotherapy treatment.    01/20/2019 -  Chemotherapy   The patient had decitabine (DACOGEN) 45 mg in sodium chloride 0.9 %  50 mL chemo infusion, 20 mg/m2 = 45 mg, Intravenous,  Once, 2 of 4 cycles Dose modification: 15 mg/m2 (original dose 20 mg/m2, Cycle 2, Reason: Other (see comments), Comment: neutropenic fever) Administration: 45 mg (01/20/2019), 45 mg (01/21/2019), 45 mg (01/22/2019), 45 mg (01/23/2019), 45 mg (01/24/2019), 35 mg (03/17/2019), 35 mg (03/18/2019), 35 mg (03/19/2019), 35 mg (03/20/2019), 35 mg (03/21/2019) ondansetron (ZOFRAN) 8 mg in sodium chloride 0.9 % 50 mL IVPB, 8 mg (100 % of original dose 8 mg), Intravenous,  Once, 2 of 4 cycles Dose modification: 8 mg (original dose 8 mg, Cycle 1) Administration: 8 mg (01/20/2019), 8 mg (01/21/2019), 8 mg (01/22/2019), 8 mg (01/23/2019), 8 mg (01/24/2019), 8 mg (03/17/2019), 8 mg (03/18/2019), 8 mg (03/19/2019), 8 mg (03/20/2019), 8 mg (03/21/2019)  for chemotherapy treatment.    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[place])   01/20/2019 -  Chemotherapy   The patient had decitabine (DACOGEN) 45 mg in sodium chloride 0.9 % 50 mL chemo infusion, 20 mg/m2 = 45 mg, Intravenous,  Once, 2 of 4 cycles Dose modification: 15 mg/m2 (original dose 20 mg/m2, Cycle 2, Reason: Other (see comments), Comment: neutropenic fever) Administration: 45 mg (01/20/2019), 45 mg (01/21/2019), 45 mg (01/22/2019), 45 mg (01/23/2019), 45 mg (01/24/2019), 35 mg (03/17/2019), 35 mg (03/18/2019), 35 mg (03/19/2019), 35 mg (03/20/2019), 35 mg (03/21/2019) ondansetron (ZOFRAN) 8 mg in sodium chloride 0.9 % 50 mL IVPB, 8 mg (100 % of original dose 8 mg), Intravenous,  Once, 2 of 4 cycles Dose modification: 8 mg (original dose 8 mg, Cycle 1) Administration: 8 mg (01/20/2019), 8 mg (01/21/2019), 8 mg (01/22/2019), 8 mg (01/23/2019), 8 mg (01/24/2019),  8 mg (03/17/2019), 8 mg (03/18/2019), 8 mg (03/19/2019), 8 mg (03/20/2019), 8 mg (03/21/2019)  for chemotherapy treatment.       CANCER STAGING: Cancer Staging No matching staging information was  found for the patient.   INTERVAL HISTORY:  [name] 73 y.o. male seen for follow-up of AML and initiate cycle 3 of decitabine and venetoclax.  He received cycle 2 on 03/17/2019.  Reported appetite of 100%.  Energy levels are 50%.  He is accompanied by his wife today.  Arthritic pains in the knees were reported as 2 out of 10.  Chronic leg swelling is also stable.  Denies any fevers or chills.  Denies any nausea, vomiting, diarrhea or constipation.  REVIEW OF SYSTEMS:  Review of Systems  Cardiovascular: Positive for leg swelling.  Musculoskeletal: Positive for arthralgias.  All other systems reviewed and are negative.    PAST MEDICAL/SURGICAL HISTORY:  Past Medical History:  Diagnosis Date  . Arthritis   . 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 Cancer ([place]) 01-28-2014   skin cancer  . CKD (chronic kidney disease), stage III   . GERD (gastroesophageal reflux disease)   . Heart murmur    NOTED DURING PHYSICAL WHEN HE WAS ENLISTING IN [facility] , DIDNT KNOW UNTIL THAT TIME AND REPORTS , "THATS THE LAST I HEARD ABOUT IT "   . History of hypertension    no longer issue  . History of kidney stones   . History of malignant melanoma of skin    excision top of scalp 2015-- no recurrence  . History of urinary retention    post op lumbar fusion surgery 04/ 2016  . Hypertension   . 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 Left ureteral calculus   . Seasonal allergies   . Wears glasses   . Wears glasses   . Wears partial dentures    upper and lower   Past Surgical History:  Procedure Laterality Date  . ANKLE FUSION Right 2007  . CARPAL TUNNEL RELEASE Left 12/28/2009   w/ pulley release left long finger  . CARPAL TUNNEL RELEASE Right 07/22/2013   Procedure: RIGHT CARPAL TUNNEL RELEASE;  Surgeon: Cammie Sickle., MD;  Location: [facility];  Service: Orthopedics;  Laterality: Right;  .  COLONOSCOPY    . CYSTO/  LEFT RETROGRADE PYELOGRAM  11/21/2010  . CYSTOSCOPY WITH STENT PLACEMENT Left 03/09/2016   Procedure: CYSTOSCOPY WITH STENT PLACEMENT;  Surgeon: Franchot Gallo, MD;  Location: [facility];  Service: Urology;  Laterality: Left;  . CYSTOSCOPY/RETROGRADE/URETEROSCOPY/STONE EXTRACTION WITH BASKET Left 03/09/2016   Procedure: CYSTOSCOPY/RETROGRADE/URETEROSCOPY/STONE EXTRACTION WITH BASKET;  Surgeon: Franchot Gallo, MD;  Location: [facility];  Service: Urology;  Laterality: Left;  . LEFT URETEROSCOPIC LASER LITHOTRIPSY STONE EXTRACTION/ STENT PLACEMENT  05/23/2010  . MOHS SURGERY     TOP OF THE HEAD   . ORIF ANKLE FRACTURE Right 1978  . PORT-A-CATH REMOVAL Right 02/14/2019   Procedure: MINOR REMOVAL PORT-A-CATH;  Surgeon: Aviva Signs, MD;  Location: [facility];  Service: General;  Laterality: Right;  . PORTACATH PLACEMENT Right 08/19/2018   Procedure: INSERTION PORT-A-CATH (attached catheter in right subclavian);  Surgeon: Aviva Signs, MD;  Location: [facility];  Service: General;  Laterality: Right;  . POSTERIOR LUMBAR FUSION  08/21/2014   laminectomy and decompression L2 -- L5  . RIGHT LOWER LEG SURGERY  X3  1975  to 1976   including ORIF  . TONSILLECTOMY AND ADENOIDECTOMY  1986  . UMBILICAL HERNIA REPAIR  2009 approx     SOCIAL HISTORY:  Social History   Socioeconomic History  . Marital status: Married    Spouse name: Not on file  . Number of children: Not on file  . Years of education: Not on file  . Highest education level: Not on file  Occupational History  . Not on file  Tobacco Use  . Smoking status: Former Smoker    Years: 20.00    Types: Cigarettes    Quit date: 07/17/1986    Years since quitting: 32.7  . Smokeless tobacco: Never Used  Substance and Sexual Activity  . Alcohol use: Yes    Alcohol/week: 7.0 - 14.0 standard drinks    Types: 7 - 14 Cans of beer per week    Comment: 1 -2 beer daily  . Drug use: No  .  Sexual activity: Not Currently  Other Topics Concern  . Not on file  Social History Narrative  . Not on file   Social Determinants of Health   Financial Resource Strain: Unknown  . Difficulty of Paying Living Expenses: Patient refused  Food Insecurity: Unknown  . Worried About Charity fundraiser in the Last Year: Patient refused  . Ran Out of Food in the Last Year: Patient refused  Transportation Needs: Unknown  . Lack of Transportation (Medical): Patient refused  . Lack of Transportation (Non-Medical): Patient refused  Physical Activity: Unknown  . Days of Exercise per Week: Patient refused  . Minutes of Exercise per Session: Patient refused  Stress: Unknown  . Feeling of Stress : Patient refused  Social Connections: Unknown  . Frequency of Communication with Friends and Family: Patient refused  . Frequency of Social Gatherings with Friends and Family: Patient refused  . Attends Religious Services: Patient refused  . Active Member of Clubs or Organizations: Patient refused  . Attends Archivist Meetings: Patient refused  . Marital Status: Patient refused  Intimate Partner Violence: Unknown  . Fear of Current or Ex-Partner: Patient refused  . Emotionally Abused: Patient refused  . Physically Abused: Patient refused  . Sexually Abused: Patient refused    FAMILY HISTORY:  Family History  Problem Relation Age of Onset  . Stroke Mother   . Prostate cancer Father   . Bone cancer Father   . Diverticulitis Father   . Rheum arthritis Sister   . Urinary tract infection Sister   . Colon cancer Neg Hx     CURRENT MEDICATIONS:  Outpatient Encounter Medications as of 04/21/2019  Medication Sig  . acetaminophen (TYLENOL) 500 MG tablet Take 1,000 mg by mouth every 6 (six) hours as needed for mild pain or moderate pain.   Marland Kitchen allopurinol (ZYLOPRIM) 300 MG tablet Take 300 mg by mouth every evening.  . cetirizine (ZYRTEC) 10 MG tablet Take 10 mg by mouth daily. IN THE  MORNING  . docusate sodium (COLACE) 100 MG capsule Take 100 mg by mouth at bedtime.   . lansoprazole (PREVACID) 15 MG capsule Take 15 mg by mouth daily.   Marland Kitchen lisinopril (ZESTRIL) 2.5 MG tablet Take 2.5 mg by mouth daily.  . magnesium oxide (MAG-OX) 400 MG tablet Take 400 mg by mouth at bedtime.   . tamsulosin (FLOMAX) 0.4 MG CAPS capsule Take 0.4 mg by mouth daily.  . traMADol (ULTRAM) 50 MG tablet TAKE 1 TABLET BY MOUTH THREE TIMES DAILY AS NEEDED FOR PAIN  .  diclofenac sodium (VOLTAREN) 1 % GEL Apply 1 g topically 3 (three) times daily as needed (knee pain.).   Marland Kitchen lidocaine-prilocaine (EMLA) cream APPLY A PEA SIZED AMOUNT TO PORT A CATH SITE AND COVER WITH PLASTIC WRAP ONE HOUR PRIOR TO CHEMOTHERAPY APPOINTMENT  . venetoclax 100 MG TABS Take 200 mg by mouth daily. (Patient not taking: Reported on 04/21/2019)  . [DISCONTINUED] cephALEXin (KEFLEX) 500 MG capsule Take 1 capsule (500 mg total) by mouth 3 (three) times daily.   No facility-administered encounter medications on file as of 04/21/2019.    ALLERGIES:  No Known Allergies   PHYSICAL EXAM:  ECOG Performance status: 1  Vitals:   04/21/19 0912  BP: 115/64  Pulse: 88  Resp: 18  Temp: 97.7 F (36.5 C)  SpO2: 96%   Filed Weights   04/21/19 0912  Weight: 223 lb 9.6 oz (101.4 kg)    Physical Exam Constitutional:      Appearance: Normal appearance.  HENT:     Mouth/Throat:     Mouth: Mucous membranes are moist.     Pharynx: Oropharynx is clear.  Eyes:     Extraocular Movements: Extraocular movements intact.     Conjunctiva/sclera: Conjunctivae normal.  Cardiovascular:     Rate and Rhythm: Normal rate and regular rhythm.     Heart sounds: Normal heart sounds.  Pulmonary:     Effort: Pulmonary effort is normal.     Breath sounds: Normal breath sounds.  Abdominal:     General: There is no distension.     Palpations: Abdomen is soft. There is no mass.  Musculoskeletal:        General: No deformity. Normal range of  motion.     Cervical back: Normal range of motion.  Skin:    General: Skin is warm.  Neurological:     General: No focal deficit present.     Mental Status: He is alert and oriented to person, place, and time.  Psychiatric:        Mood and Affect: Mood normal.        Behavior: Behavior normal.      LABORATORY DATA:  I have reviewed the labs as listed.  CBC    Component Value Date/Time   WBC 2.0 (L) 04/21/2019 0919   RBC 4.58 04/21/2019 0919   HGB 14.3 04/21/2019 0919   HCT 45.7 04/21/2019 0919   PLT 130 (L) 04/21/2019 0919   MCV 99.8 04/21/2019 0919   MCH 31.2 04/21/2019 0919   MCHC 31.3 04/21/2019 0919   RDW 15.4 04/21/2019 0919   LYMPHSABS 1.2 04/21/2019 0919   MONOABS 0.4 04/21/2019 0919   EOSABS 0.1 04/21/2019 0919   BASOSABS 0.0 04/21/2019 0919   CMP Latest Ref Rng & Units 04/21/2019 04/14/2019 03/27/2019  Glucose 70 - 99 mg/dL 114(H) 106(H) 99  BUN 8 - 23 mg/dL 24(H) 22 23  Creatinine 0.61 - 1.24 mg/dL 1.11 0.96 0.96  Sodium 135 - 145 mmol/L 142 139 139  Potassium 3.5 - 5.1 mmol/L 3.9 4.6 4.4  Chloride 98 - 111 mmol/L 107 105 105  CO2 22 - 32 mmol/L _0 Calcium 8.9 - 10.3 mg/dL 9.7 9.7 9.4  Total Protein 6.5 - 8.1 g/dL 6.4(L) 6.5 6.4(L)  Total Bilirubin 0.3 - 1.2 mg/dL 0.6 0.6 1.0  Alkaline Phos 38 - 126 U/L 61 61 56  AST 15 - 41 U/L _1 ALT 0 - 44 U/L 23 24 26  ASSESSMENT & PLAN:   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[place]) 1.  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  Flow cytometry-12% blasts.  Chromosome analysis and FISH panel normal. -4 cycles of Vidaza from 08/22/2018 through 12/02/2018. -Repeat bone marrow biopsy on 12/23/2018 at [facility] showed 25% blasts and 90% cellular marrow, consistent with AML with MDS related changes.  Flow cytometry showed 11% blasts.  MDS FISH panel was normal. -NGS panel did not show any targetable mutations. -Cycle 1 of decitabine (20 mg/m2) with venetoclax 300 mg daily  on 01/20/2019. -He was hospitalized from 02/09/2019 through 02/18/2019 with neutropenic fever.  His venetoclax was held at that time. -His blood cultures grew Streptococcus Infantarius.  His Port-A-Cath was removed on 02/14/2019.  He also had lung infiltrates with cough which resolved.  He had a PICC line placed in the left arm. -Cycle 2 of decitabine (15 mg/M 2) on 03/17/2019.  Venetoclax was dose reduced to 200 mg, took for 2 weeks until 03/31/2019. -His chemotherapy last week was held as his ANC was 200. -We reviewed his labs today.  White count is 2.0 with ANC of 200.  Hemoglobin is 14.3 and platelet count is 130. -I have recommended G-CSF 480 mcg today.  We will check his CBC tomorrow and will likely proceed with his cycle 3 tomorrow through Friday for 4 days. -He will also start venetoclax along with decitabine.  He will continue venetoclax 200 mg for 2 weeks. -I plan to evaluate him in 1 week with labs..  2.  CKD: -Creatinine slightly increased to 1.11.  3.  Bilateral knee pains: -He is taking tramadol 3 times a day along with Tylenol which is helping.  4.  Hypomagnesemia: -His magnesium today is 1.6.  He is taking magnesium once daily.  We will closely monitor it.  If it continues to be low, we will consider increasing magnesium to twice daily.    Orders placed this encounter:  Orders Placed This Encounter  Procedures  . CBC with Differential      [name], MD [place] [phone]

## 2019-04-22 ENCOUNTER — Inpatient Hospital Stay (HOSPITAL_COMMUNITY): Payer: Medicare Other

## 2019-04-22 ENCOUNTER — Encounter (HOSPITAL_COMMUNITY): Payer: Self-pay

## 2019-04-22 VITALS — BP 139/71 | HR 75 | Temp 97.8°F | Resp 18

## 2019-04-22 DIAGNOSIS — D46Z Other myelodysplastic syndromes: Secondary | ICD-10-CM

## 2019-04-22 DIAGNOSIS — Z95828 Presence of other vascular implants and grafts: Secondary | ICD-10-CM

## 2019-04-22 DIAGNOSIS — Z5111 Encounter for antineoplastic chemotherapy: Secondary | ICD-10-CM | POA: Diagnosis not present

## 2019-04-22 DIAGNOSIS — C9200 Acute myeloblastic leukemia, not having achieved remission: Secondary | ICD-10-CM

## 2019-04-22 DIAGNOSIS — D61818 Other pancytopenia: Secondary | ICD-10-CM

## 2019-04-22 LAB — COMPREHENSIVE METABOLIC PANEL
ALT: 24 U/L (ref 0–44)
AST: 20 U/L (ref 15–41)
Albumin: 3.9 g/dL (ref 3.5–5.0)
Alkaline Phosphatase: 69 U/L (ref 38–126)
Anion gap: 8 (ref 5–15)
BUN: 21 mg/dL (ref 8–23)
CO2: 25 mmol/L (ref 22–32)
Calcium: 9.6 mg/dL (ref 8.9–10.3)
Chloride: 106 mmol/L (ref 98–111)
Creatinine, Ser: 1.15 mg/dL (ref 0.61–1.24)
GFR calc Af Amer: >60 mL/min (ref >60)
GFR calc non Af Amer: >60 mL/min (ref >60)
Glucose, Bld: 120 mg/dL — ABNORMAL HIGH (ref 70–99)
Potassium: 4.3 mmol/L (ref 3.5–5.1)
Sodium: 139 mmol/L (ref 135–145)
Total Bilirubin: 0.6 mg/dL (ref 0.3–1.2)
Total Protein: 6.9 g/dL (ref 6.5–8.1)

## 2019-04-22 LAB — CBC WITH DIFFERENTIAL/PLATELET
Abs Immature Granulocytes: 0.04 10*3/uL (ref 0.00–0.07)
Basophils Absolute: 0 10*3/uL (ref 0.0–0.1)
Basophils Relative: 1 %
Eosinophils Absolute: 0.1 10*3/uL (ref 0.0–0.5)
Eosinophils Relative: 2 %
HCT: 49.2 % (ref 39.0–52.0)
Hemoglobin: 15.6 g/dL (ref 13.0–17.0)
Immature Granulocytes: 1 %
Lymphocytes Relative: 28 %
Lymphs Abs: 1.4 10*3/uL (ref 0.7–4.0)
MCH: 31.5 pg (ref 26.0–34.0)
MCHC: 31.7 g/dL (ref 30.0–36.0)
MCV: 99.4 fL (ref 80.0–100.0)
Monocytes Absolute: 1.1 10*3/uL — ABNORMAL HIGH (ref 0.1–1.0)
Monocytes Relative: 21 %
Neutro Abs: 2.5 10*3/uL (ref 1.7–7.7)
Neutrophils Relative %: 47 %
Platelets: 122 10*3/uL — ABNORMAL LOW (ref 150–400)
RBC: 4.95 MIL/uL (ref 4.22–5.81)
RDW: 15.2 % (ref 11.5–15.5)
WBC Morphology: INCREASED
WBC: 5.2 10*3/uL (ref 4.0–10.5)
nRBC: 0 % (ref 0.0–0.2)

## 2019-04-22 LAB — MAGNESIUM: Magnesium: 1.4 mg/dL — ABNORMAL LOW (ref 1.7–2.4)

## 2019-04-22 MED ORDER — ALTEPLASE 2 MG IJ SOLR: 2.0000 mg | Freq: Once | INTRAMUSCULAR | Status: DC | PRN

## 2019-04-22 MED ORDER — SODIUM CHLORIDE 0.9 % IV SOLN
15.0000 mg/m2 | Freq: Once | INTRAVENOUS | Status: AC
  Administered 2019-04-22: 35 mg via INTRAVENOUS
  Filled 2019-04-22: qty 7

## 2019-04-22 MED ORDER — HEPARIN SOD (PORK) LOCK FLUSH 100 UNIT/ML IV SOLN
250.0000 [IU] | Freq: Once | INTRAVENOUS | Status: AC
  Administered 2019-04-22: 250 [IU] via INTRAVENOUS

## 2019-04-22 MED ORDER — MAGNESIUM SULFATE 2 GM/50ML IV SOLN
2.0000 g | Freq: Once | INTRAVENOUS | Status: AC
  Administered 2019-04-22: 2 g via INTRAVENOUS
  Filled 2019-04-22: qty 50

## 2019-04-22 MED ORDER — SODIUM CHLORIDE 0.9% FLUSH
3.0000 mL | INTRAVENOUS | Status: DC | PRN
  Administered 2019-04-22: 3 mL

## 2019-04-22 MED ORDER — SODIUM CHLORIDE 0.9 % IV SOLN: Freq: Once | INTRAVENOUS | Status: AC

## 2019-04-22 MED ORDER — SODIUM CHLORIDE 0.9 % IV SOLN
8.0000 mg | Freq: Once | INTRAVENOUS | Status: AC
  Administered 2019-04-22: 8 mg via INTRAVENOUS
  Filled 2019-04-22: qty 4

## 2019-04-22 NOTE — Progress Notes (Signed)
1005 Labs reviewed with Dr. Delton Coombes and Richard Wallace approved for Decitabine infusion today with Magnesium 2 grams IV added per MD                                                                                                    [name] tolerated Decitabine and Magnesium infusions well without complaints or incident. VSS upon discharge. Richard Wallace discharged self ambulatory using his walker in satisfactory condition

## 2019-04-22 NOTE — Patient Instructions (Signed)
[facility] Discharge Instructions for Patients Receiving Chemotherapy   Beginning January 23rd 2017 lab work for the [facility] will be done in the  Main lab at [facility] on 1st floor. If you have a lab appointment with the [facility] please come in thru the  Main Entrance and check in at the main information desk   Today you received the following chemotherapy agents Decitabine as well as Magnesium infusion. Follow-up as scheduled. Call clinic for any questions or concerns  To help prevent nausea and vomiting after your treatment, we encourage you to take your nausea medication   If you develop nausea and vomiting, or diarrhea that is not controlled by your medication, call the clinic.  The clinic phone number is [phone]. Office hours are Monday-Friday 8:30am-5:00pm.  BELOW ARE SYMPTOMS THAT SHOULD BE REPORTED IMMEDIATELY:  *FEVER GREATER THAN 101.0 F  *CHILLS WITH OR WITHOUT FEVER  NAUSEA AND VOMITING THAT IS NOT CONTROLLED WITH YOUR NAUSEA MEDICATION  *UNUSUAL SHORTNESS OF BREATH  *UNUSUAL BRUISING OR BLEEDING  TENDERNESS IN MOUTH AND THROAT WITH OR WITHOUT PRESENCE OF ULCERS  *URINARY PROBLEMS  *BOWEL PROBLEMS  UNUSUAL RASH Items with * indicate a potential emergency and should be followed up as soon as possible. If you have an emergency after office hours please contact your primary care physician or go to the nearest emergency department.  Please call the clinic during office hours if you have any questions or concerns.   You may also contact the Patient Navigator at [phone] should you have any questions or need assistance in obtaining follow up care.      Resources For Cancer Patients and their Caregivers ? American Cancer Society: Can assist with transportation, wigs, general needs, runs Look Good Feel Better.        928-124-4803 ? Cancer Care: Provides financial assistance, online support groups, medication/co-pay  assistance.  1-800-813-HOPE (905)799-4301) ? [place] Assists [place] cancer patients and their families through emotional , educational and financial support.  6624678510 ? [place] DSS Where to apply for food stamps, Medicaid and utility assistance. 845-395-1395 ? [employer]: Transportation to medical appointments. [phone] ? Social Security Administration: May apply for disability if have a Stage IV cancer. 314-761-6878 [phone] ? [employer], Disability and Transit Services: Assists with nutrition, care and transit needs. [phone]

## 2019-04-23 ENCOUNTER — Inpatient Hospital Stay (HOSPITAL_COMMUNITY): Payer: Medicare Other

## 2019-04-23 ENCOUNTER — Encounter (HOSPITAL_COMMUNITY): Payer: Self-pay

## 2019-04-23 ENCOUNTER — Other Ambulatory Visit: Payer: Self-pay

## 2019-04-23 VITALS — BP 133/84 | HR 81 | Temp 96.8°F | Resp 18

## 2019-04-23 DIAGNOSIS — Z95828 Presence of other vascular implants and grafts: Secondary | ICD-10-CM

## 2019-04-23 DIAGNOSIS — C9200 Acute myeloblastic leukemia, not having achieved remission: Secondary | ICD-10-CM

## 2019-04-23 DIAGNOSIS — D46Z Other myelodysplastic syndromes: Secondary | ICD-10-CM

## 2019-04-23 DIAGNOSIS — Z5111 Encounter for antineoplastic chemotherapy: Secondary | ICD-10-CM | POA: Diagnosis not present

## 2019-04-23 MED ORDER — SODIUM CHLORIDE 0.9% FLUSH
3.0000 mL | INTRAVENOUS | Status: DC | PRN
  Administered 2019-04-23: 3 mL

## 2019-04-23 MED ORDER — SODIUM CHLORIDE 0.9 % IV SOLN
8.0000 mg | Freq: Once | INTRAVENOUS | Status: AC
  Administered 2019-04-23: 8 mg via INTRAVENOUS
  Filled 2019-04-23: qty 4

## 2019-04-23 MED ORDER — HEPARIN SOD (PORK) LOCK FLUSH 100 UNIT/ML IV SOLN
250.0000 [IU] | Freq: Once | INTRAVENOUS | Status: AC
  Administered 2019-04-23: 250 [IU] via INTRAVENOUS

## 2019-04-23 MED ORDER — SODIUM CHLORIDE 0.9 % IV SOLN
15.0000 mg/m2 | Freq: Once | INTRAVENOUS | Status: AC
  Administered 2019-04-23: 35 mg via INTRAVENOUS
  Filled 2019-04-23: qty 7

## 2019-04-23 MED ORDER — SODIUM CHLORIDE 0.9 % IV SOLN: Freq: Once | INTRAVENOUS | Status: AC

## 2019-04-23 MED ORDER — ALTEPLASE 2 MG IJ SOLR: 2.0000 mg | Freq: Once | INTRAMUSCULAR | Status: DC | PRN

## 2019-04-23 NOTE — Progress Notes (Signed)
[name] tolerated Decitabine infusion well without complaints or incident. VSS upon discharge. Pt discharged self ambulatory using his walker in satisfactory condition

## 2019-04-23 NOTE — Patient Instructions (Signed)
[facility] Discharge Instructions for Patients Receiving Chemotherapy   Beginning January 23rd 2017 lab work for the [facility] will be done in the  Main lab at [facility] on 1st floor. If you have a lab appointment with the [facility] please come in thru the  Main Entrance and check in at the main information desk   Today you received the following chemotherapy agents Decitabine. Follow-up as scheduled. Call clinic for any questions or concerns  To help prevent nausea and vomiting after your treatment, we encourage you to take your nausea medication   If you develop nausea and vomiting, or diarrhea that is not controlled by your medication, call the clinic.  The clinic phone number is [phone]. Office hours are Monday-Friday 8:30am-5:00pm.  BELOW ARE SYMPTOMS THAT SHOULD BE REPORTED IMMEDIATELY:  *FEVER GREATER THAN 101.0 F  *CHILLS WITH OR WITHOUT FEVER  NAUSEA AND VOMITING THAT IS NOT CONTROLLED WITH YOUR NAUSEA MEDICATION  *UNUSUAL SHORTNESS OF BREATH  *UNUSUAL BRUISING OR BLEEDING  TENDERNESS IN MOUTH AND THROAT WITH OR WITHOUT PRESENCE OF ULCERS  *URINARY PROBLEMS  *BOWEL PROBLEMS  UNUSUAL RASH Items with * indicate a potential emergency and should be followed up as soon as possible. If you have an emergency after office hours please contact your primary care physician or go to the nearest emergency department.  Please call the clinic during office hours if you have any questions or concerns.   You may also contact the Patient Navigator at [phone] should you have any questions or need assistance in obtaining follow up care.      Resources For Cancer Patients and their Caregivers ? American Cancer Society: Can assist with transportation, wigs, general needs, runs Look Good Feel Better.        (636)074-4738 ? Cancer Care: Provides financial assistance, online support groups, medication/co-pay assistance.  1-800-813-HOPE  (747)862-7777) ? [place] Assists [place] cancer patients and their families through emotional , educational and financial support.  425-260-9044 ? [place] DSS Where to apply for food stamps, Medicaid and utility assistance. 415-409-9653 ? [employer]: Transportation to medical appointments. 703-845-0405 ? Social Security Administration: May apply for disability if have a Stage IV cancer. 605-264-5181 915-442-0514 ? [employer], Disability and Transit Services: Assists with nutrition, care and transit needs. ([phone])[phone]

## 2019-04-24 ENCOUNTER — Inpatient Hospital Stay (HOSPITAL_COMMUNITY): Payer: Medicare Other

## 2019-04-24 VITALS — BP 136/87 | HR 73 | Temp 96.9°F | Resp 18

## 2019-04-24 DIAGNOSIS — Z95828 Presence of other vascular implants and grafts: Secondary | ICD-10-CM

## 2019-04-24 DIAGNOSIS — D46Z Other myelodysplastic syndromes: Secondary | ICD-10-CM

## 2019-04-24 DIAGNOSIS — Z5111 Encounter for antineoplastic chemotherapy: Secondary | ICD-10-CM | POA: Diagnosis not present

## 2019-04-24 DIAGNOSIS — C9200 Acute myeloblastic leukemia, not having achieved remission: Secondary | ICD-10-CM

## 2019-04-24 MED ORDER — SODIUM CHLORIDE 0.9 % IV SOLN: Freq: Once | INTRAVENOUS | Status: AC

## 2019-04-24 MED ORDER — HEPARIN SOD (PORK) LOCK FLUSH 100 UNIT/ML IV SOLN
500.0000 [IU] | Freq: Once | INTRAVENOUS | Status: AC | PRN
  Administered 2019-04-24: 500 [IU]

## 2019-04-24 MED ORDER — SODIUM CHLORIDE 0.9 % IV SOLN
15.0000 mg/m2 | Freq: Once | INTRAVENOUS | Status: AC
  Administered 2019-04-24: 10:00:00 35 mg via INTRAVENOUS
  Filled 2019-04-24: qty 7

## 2019-04-24 MED ORDER — SODIUM CHLORIDE 0.9 % IV SOLN
8.0000 mg | Freq: Once | INTRAVENOUS | Status: AC
  Administered 2019-04-24: 8 mg via INTRAVENOUS
  Filled 2019-04-24: qty 4

## 2019-04-24 MED ORDER — SODIUM CHLORIDE 0.9% FLUSH
10.0000 mL | INTRAVENOUS | Status: DC | PRN
  Administered 2019-04-24: 10 mL

## 2019-04-24 NOTE — Progress Notes (Signed)
Treatment given per orders. Patient tolerated it well without problems. Vitals stable and discharged home from clinic ambulatory. Follow up as scheduled.  

## 2019-04-24 NOTE — Patient Instructions (Signed)
[facility] Discharge Instructions for Patients Receiving Chemotherapy  Today you received the following chemotherapy agents   To help prevent nausea and vomiting after your treatment, we encourage you to take your nausea medication   If you develop nausea and vomiting that is not controlled by your nausea medication, call the clinic.   BELOW ARE SYMPTOMS THAT SHOULD BE REPORTED IMMEDIATELY:  *FEVER GREATER THAN 100.5 F  *CHILLS WITH OR WITHOUT FEVER  NAUSEA AND VOMITING THAT IS NOT CONTROLLED WITH YOUR NAUSEA MEDICATION  *UNUSUAL SHORTNESS OF BREATH  *UNUSUAL BRUISING OR BLEEDING  TENDERNESS IN MOUTH AND THROAT WITH OR WITHOUT PRESENCE OF ULCERS  *URINARY PROBLEMS  *BOWEL PROBLEMS  UNUSUAL RASH Items with * indicate a potential emergency and should be followed up as soon as possible.  Feel free to call the clinic should you have any questions or concerns. The clinic phone number is [phone].  Please show the CHEMO ALERT CARD at check-in to the Emergency Department and triage nurse.   

## 2019-04-25 ENCOUNTER — Other Ambulatory Visit: Payer: Self-pay

## 2019-04-25 ENCOUNTER — Inpatient Hospital Stay (HOSPITAL_COMMUNITY): Payer: Medicare Other

## 2019-04-25 VITALS — BP 133/78 | HR 78 | Temp 96.9°F | Resp 18

## 2019-04-25 DIAGNOSIS — Z5111 Encounter for antineoplastic chemotherapy: Secondary | ICD-10-CM | POA: Diagnosis not present

## 2019-04-25 DIAGNOSIS — D46Z Other myelodysplastic syndromes: Secondary | ICD-10-CM

## 2019-04-25 DIAGNOSIS — Z95828 Presence of other vascular implants and grafts: Secondary | ICD-10-CM

## 2019-04-25 DIAGNOSIS — C9200 Acute myeloblastic leukemia, not having achieved remission: Secondary | ICD-10-CM

## 2019-04-25 MED ORDER — ALTEPLASE 2 MG IJ SOLR
INTRAMUSCULAR | Status: AC
  Filled 2019-04-25: qty 2

## 2019-04-25 MED ORDER — SODIUM CHLORIDE 0.9 % IV SOLN: Freq: Once | INTRAVENOUS | Status: AC

## 2019-04-25 MED ORDER — SODIUM CHLORIDE 0.9% FLUSH
10.0000 mL | INTRAVENOUS | Status: DC | PRN
  Administered 2019-04-25: 10 mL

## 2019-04-25 MED ORDER — STERILE WATER FOR INJECTION IJ SOLN
INTRAMUSCULAR | Status: AC
  Filled 2019-04-25: qty 10

## 2019-04-25 MED ORDER — HEPARIN SOD (PORK) LOCK FLUSH 100 UNIT/ML IV SOLN
500.0000 [IU] | Freq: Once | INTRAVENOUS | Status: AC | PRN
  Administered 2019-04-25: 500 [IU]

## 2019-04-25 MED ORDER — PEGFILGRASTIM-JMDB 6 MG/0.6ML ~~LOC~~ SOSY
PREFILLED_SYRINGE | SUBCUTANEOUS | Status: AC
  Filled 2019-04-25: qty 0.6

## 2019-04-25 MED ORDER — SODIUM CHLORIDE 0.9 % IV SOLN
15.0000 mg/m2 | Freq: Once | INTRAVENOUS | Status: AC
  Administered 2019-04-25: 35 mg via INTRAVENOUS
  Filled 2019-04-25: qty 7

## 2019-04-25 MED ORDER — SODIUM CHLORIDE 0.9 % IV SOLN
8.0000 mg | Freq: Once | INTRAVENOUS | Status: AC
  Administered 2019-04-25: 8 mg via INTRAVENOUS
  Filled 2019-04-25: qty 4

## 2019-04-25 NOTE — Progress Notes (Signed)
Patient presents today for D4 Decitabine. No complaints of any changes since the last visit. Vital signs within parameters for treatment.   Treatment given today per MD orders. Tolerated infusion without adverse affects. Vital signs stable. No complaints at this time. Discharged from clinic ambulatory. F/U with [facility] as scheduled.   Treatment given today per MD orders. Tolerated infusion without adverse affects. Vital signs stable. No complaints at this time. Discharged from clinic ambulatory. F/U with [facility] as scheduled.

## 2019-04-25 NOTE — Patient Instructions (Signed)
[facility] Discharge Instructions for Patients Receiving Chemotherapy  Today you received the following chemotherapy agents   To help prevent nausea and vomiting after your treatment, we encourage you to take your nausea medication   If you develop nausea and vomiting that is not controlled by your nausea medication, call the clinic.   BELOW ARE SYMPTOMS THAT SHOULD BE REPORTED IMMEDIATELY:  *FEVER GREATER THAN 100.5 F  *CHILLS WITH OR WITHOUT FEVER  NAUSEA AND VOMITING THAT IS NOT CONTROLLED WITH YOUR NAUSEA MEDICATION  *UNUSUAL SHORTNESS OF BREATH  *UNUSUAL BRUISING OR BLEEDING  TENDERNESS IN MOUTH AND THROAT WITH OR WITHOUT PRESENCE OF ULCERS  *URINARY PROBLEMS  *BOWEL PROBLEMS  UNUSUAL RASH Items with * indicate a potential emergency and should be followed up as soon as possible.  Feel free to call the clinic should you have any questions or concerns. The clinic phone number is [phone].  Please show the CHEMO ALERT CARD at check-in to the Emergency Department and triage nurse.   

## 2019-04-28 ENCOUNTER — Encounter (HOSPITAL_COMMUNITY): Payer: Self-pay | Admitting: Hematology

## 2019-04-28 ENCOUNTER — Other Ambulatory Visit: Payer: Self-pay

## 2019-04-28 ENCOUNTER — Inpatient Hospital Stay (HOSPITAL_COMMUNITY): Payer: Medicare Other

## 2019-04-28 ENCOUNTER — Inpatient Hospital Stay (HOSPITAL_BASED_OUTPATIENT_CLINIC_OR_DEPARTMENT_OTHER): Payer: Medicare Other | Admitting: Hematology

## 2019-04-28 DIAGNOSIS — C9200 Acute myeloblastic leukemia, not having achieved remission: Secondary | ICD-10-CM

## 2019-04-28 DIAGNOSIS — Z5111 Encounter for antineoplastic chemotherapy: Secondary | ICD-10-CM | POA: Diagnosis not present

## 2019-04-28 DIAGNOSIS — D46Z Other myelodysplastic syndromes: Secondary | ICD-10-CM

## 2019-04-28 DIAGNOSIS — D61818 Other pancytopenia: Secondary | ICD-10-CM

## 2019-04-28 LAB — CBC WITH DIFFERENTIAL/PLATELET
Abs Immature Granulocytes: 0.02 10*3/uL (ref 0.00–0.07)
Basophils Absolute: 0 10*3/uL (ref 0.0–0.1)
Basophils Relative: 0 %
Eosinophils Absolute: 0.1 10*3/uL (ref 0.0–0.5)
Eosinophils Relative: 2 %
HCT: 44.7 % (ref 39.0–52.0)
Hemoglobin: 14.4 g/dL (ref 13.0–17.0)
Immature Granulocytes: 1 %
Lymphocytes Relative: 34 %
Lymphs Abs: 1 10*3/uL (ref 0.7–4.0)
MCH: 31.9 pg (ref 26.0–34.0)
MCHC: 32.2 g/dL (ref 30.0–36.0)
MCV: 99.1 fL (ref 80.0–100.0)
Monocytes Absolute: 0.4 10*3/uL (ref 0.1–1.0)
Monocytes Relative: 12 %
Neutro Abs: 1.6 10*3/uL — ABNORMAL LOW (ref 1.7–7.7)
Neutrophils Relative %: 51 %
Platelets: 135 10*3/uL — ABNORMAL LOW (ref 150–400)
RBC: 4.51 MIL/uL (ref 4.22–5.81)
RDW: 15.2 % (ref 11.5–15.5)
WBC: 3.1 10*3/uL — ABNORMAL LOW (ref 4.0–10.5)
nRBC: 0 % (ref 0.0–0.2)

## 2019-04-28 LAB — COMPREHENSIVE METABOLIC PANEL
ALT: 22 U/L (ref 0–44)
AST: 21 U/L (ref 15–41)
Albumin: 3.6 g/dL (ref 3.5–5.0)
Alkaline Phosphatase: 67 U/L (ref 38–126)
Anion gap: 10 (ref 5–15)
BUN: 26 mg/dL — ABNORMAL HIGH (ref 8–23)
CO2: 26 mmol/L (ref 22–32)
Calcium: 9.5 mg/dL (ref 8.9–10.3)
Chloride: 104 mmol/L (ref 98–111)
Creatinine, Ser: 1.08 mg/dL (ref 0.61–1.24)
GFR calc Af Amer: >60 mL/min (ref >60)
GFR calc non Af Amer: >60 mL/min (ref >60)
Glucose, Bld: 118 mg/dL — ABNORMAL HIGH (ref 70–99)
Potassium: 4.4 mmol/L (ref 3.5–5.1)
Sodium: 140 mmol/L (ref 135–145)
Total Bilirubin: 0.7 mg/dL (ref 0.3–1.2)
Total Protein: 6.3 g/dL — ABNORMAL LOW (ref 6.5–8.1)

## 2019-04-28 LAB — MAGNESIUM: Magnesium: 1.7 mg/dL (ref 1.7–2.4)

## 2019-04-28 LAB — URIC ACID: Uric Acid, Serum: 5.8 mg/dL (ref 3.7–8.6)

## 2019-04-28 LAB — PHOSPHORUS: Phosphorus: 3.1 mg/dL (ref 2.5–4.6)

## 2019-04-28 MED ORDER — HEPARIN SOD (PORK) LOCK FLUSH 100 UNIT/ML IV SOLN
250.0000 [IU] | Freq: Once | INTRAVENOUS | Status: AC
  Administered 2019-04-28: 250 [IU] via INTRAVENOUS

## 2019-04-28 MED ORDER — SODIUM CHLORIDE 0.9% FLUSH
3.0000 mL | INTRAVENOUS | Status: DC | PRN
  Administered 2019-04-28: 10:00:00 3 mL via INTRAVENOUS

## 2019-04-28 NOTE — Patient Instructions (Addendum)
[place] at [facility] Discharge Instructions  You were seen today by Dr. Delton Coombes. He went over your recent results. Continue Venetoclax until next Tuesday then stop. He will see you back in 1 week for labs and follow up.   Thank you for choosing [place] at [facility] to provide your oncology and hematology care.  To afford each patient quality time with our provider, please arrive at least 15 minutes before your scheduled appointment time.   If you have a lab appointment with the [facility] please come in thru the  Main Entrance and check in at the main information desk  You need to re-schedule your appointment should you arrive 10 or more minutes late.  We strive to give you quality time with our providers, and arriving late affects you and other patients whose appointments are after yours.  Also, if you no show three or more times for appointments you may be dismissed from the clinic at the providers discretion.     Again, thank you for choosing [facility].  Our hope is that these requests will decrease the amount of time that you wait before being seen by our physicians.       _____________________________________________________________  Should you have questions after your visit to [facility], please contact our office at [phone] between the hours of 8:00 a.m. and 4:30 p.m.  Voicemails left after 4:00 p.m. will not be returned until the following business day.  For prescription refill requests, have your pharmacy contact our office and allow 72 hours.    Cancer Center Support Programs:   > Cancer Support Group  2nd Tuesday of the month 1pm-2pm, Journey Room

## 2019-04-28 NOTE — Progress Notes (Signed)
Richard Wallace [address] [place] [address]   CLINIC:  Medical Oncology/Hematology  PCP:  [name], MD [place] [address] [phone]   REASON FOR VISIT:  Follow-up for newly diagnosed AML.  CURRENT THERAPY: Decitabine and venetoclax  BRIEF ONCOLOGIC HISTORY:  Oncology History  MDS (myelodysplastic syndrome), high grade (HCC)  08/14/2018 Initial Diagnosis   MDS (myelodysplastic syndrome), high grade ([place])   08/22/2018 - 12/31/2018 Chemotherapy   The patient had palonosetron (ALOXI) injection 0.25 mg, 0.25 mg, Intravenous,  Once, 4 of 6 cycles Administration: 0.25 mg (08/22/2018), 0.25 mg (08/26/2018), 0.25 mg (08/28/2018), 0.25 mg (08/30/2018), 0.25 mg (10/01/2018), 0.25 mg (10/02/2018), 0.25 mg (11/04/2018), 0.25 mg (09/23/2018), 0.25 mg (09/25/2018), 0.25 mg (09/27/2018), 0.25 mg (10/28/2018), 0.25 mg (10/30/2018), 0.25 mg (11/01/2018), 0.25 mg (12/02/2018), 0.25 mg (12/04/2018), 0.25 mg (12/06/2018) azaCITIDine (VIDAZA) 100 mg in sodium chloride 0.9 % 50 mL chemo infusion, 110 mg (66.7 % of original dose 75 mg/m2), Intravenous, Once, 4 of 6 cycles Dose modification: 50 mg/m2 (original dose 75 mg/m2, Cycle 1, Reason: Provider Judgment), 50 mg/m2 (original dose 75 mg/m2, Cycle 2, Reason: Provider Judgment) Administration: 100 mg (08/22/2018), 100 mg (08/23/2018), 100 mg (08/26/2018), 100 mg (08/27/2018), 100 mg (08/28/2018), 100 mg (08/29/2018), 100 mg (08/30/2018), 100 mg (10/01/2018), 100 mg (10/02/2018), 100 mg (11/04/2018), 100 mg (11/05/2018), 100 mg (09/23/2018), 100 mg (09/24/2018), 100 mg (09/25/2018), 100 mg (09/26/2018), 100 mg (09/27/2018), 100 mg (10/28/2018), 100 mg (10/29/2018), 100 mg (10/30/2018), 100 mg (10/31/2018), 100 mg (11/01/2018), 100 mg (12/02/2018), 100 mg (12/03/2018), 100 mg (12/04/2018), 100 mg (12/05/2018), 100 mg (12/06/2018)  for chemotherapy treatment.    01/20/2019 -  Chemotherapy   The patient had decitabine (DACOGEN) 45 mg in sodium chloride 0.9 %  50 mL chemo infusion, 20 mg/m2 = 45 mg, Intravenous,  Once, 3 of 4 cycles Dose modification: 15 mg/m2 (original dose 20 mg/m2, Cycle 2, Reason: Other (see comments), Comment: neutropenic fever) Administration: 45 mg (01/20/2019), 45 mg (01/21/2019), 45 mg (01/22/2019), 45 mg (01/23/2019), 45 mg (01/24/2019), 35 mg (03/17/2019), 35 mg (03/18/2019), 35 mg (03/19/2019), 35 mg (03/20/2019), 35 mg (03/21/2019), 35 mg (04/22/2019), 35 mg (04/23/2019), 35 mg (04/24/2019), 35 mg (04/25/2019) ondansetron (ZOFRAN) 8 mg in sodium chloride 0.9 % 50 mL IVPB, 8 mg (100 % of original dose 8 mg), Intravenous,  Once, 3 of 4 cycles Dose modification: 8 mg (original dose 8 mg, Cycle 1) Administration: 8 mg (01/20/2019), 8 mg (01/21/2019), 8 mg (01/22/2019), 8 mg (01/23/2019), 8 mg (01/24/2019), 8 mg (03/17/2019), 8 mg (03/18/2019), 8 mg (03/19/2019), 8 mg (03/20/2019), 8 mg (03/21/2019), 8 mg (04/22/2019), 8 mg (04/23/2019), 8 mg (04/24/2019), 8 mg (04/25/2019)  for chemotherapy treatment.    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[place])  01/07/2019 Initial Diagnosis   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[place])   01/20/2019 -  Chemotherapy   The patient had decitabine (DACOGEN) 45 mg in sodium chloride 0.9 % 50 mL chemo infusion, 20 mg/m2 = 45 mg, Intravenous,  Once, 3 of 4 cycles Dose modification: 15 mg/m2 (original dose 20 mg/m2, Cycle 2, Reason: Other (see comments), Comment: neutropenic fever) Administration: 45 mg (01/20/2019), 45 mg (01/21/2019), 45 mg (01/22/2019), 45 mg (01/23/2019), 45 mg (01/24/2019), 35 mg (03/17/2019), 35 mg (03/18/2019), 35 mg (03/19/2019), 35 mg (03/20/2019), 35 mg (03/21/2019), 35 mg (04/22/2019), 35 mg (04/23/2019), 35 mg (04/24/2019), 35 mg (04/25/2019) ondansetron (ZOFRAN) 8 mg in sodium chloride 0.9 % 50 mL IVPB, 8 mg (100 % of original  dose 8 mg), Intravenous,  Once, 3 of 4 cycles Dose modification: 8 mg (original dose 8 mg, Cycle 1) Administration: 8 mg (01/20/2019), 8 mg (01/21/2019), 8 mg (01/22/2019), 8 mg  (01/23/2019), 8 mg (01/24/2019), 8 mg (03/17/2019), 8 mg (03/18/2019), 8 mg (03/19/2019), 8 mg (03/20/2019), 8 mg (03/21/2019), 8 mg (04/22/2019), 8 mg (04/23/2019), 8 mg (04/24/2019), 8 mg (04/25/2019)  for chemotherapy treatment.       CANCER STAGING: Cancer Staging No matching staging information was found for the patient.   INTERVAL HISTORY:  Richard Wallace 73 y.o. male seen for follow-up and toxicity assessment of AML.  Started cycle 3 on 04/22/2019.  Did not experience any nausea, vomiting, diarrhea or constipation.  Appetite is 100%.  Energy levels are 50%.  Leg swellings are chronic and stable.  Arthralgias are also stable.  Denies any fevers, night sweats or chills.  No ER visits or hospitalizations.  He is taking venetoclax 200 mg daily.  REVIEW OF SYSTEMS:  Review of Systems  Cardiovascular: Positive for leg swelling.  Musculoskeletal: Positive for arthralgias.  All other systems reviewed and are negative.    PAST MEDICAL/SURGICAL HISTORY:  Past Medical History:  Diagnosis Date  . Arthritis   . 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 Cancer ([place]) 01-28-2014   skin cancer  . CKD (chronic kidney disease), stage III   . GERD (gastroesophageal reflux disease)   . Heart murmur    NOTED DURING PHYSICAL WHEN HE WAS ENLISTING IN [place] , DIDNT KNOW UNTIL THAT TIME AND REPORTS , "THATS THE LAST I HEARD ABOUT IT "   . History of hypertension    no longer issue  . History of kidney stones   . History of malignant melanoma of skin    excision top of scalp 2015-- no recurrence  . History of urinary retention    post op lumbar fusion surgery 04/ 2016  . Hypertension   . 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 Left ureteral calculus   . Seasonal allergies   . Wears glasses   . Wears glasses   . Wears partial dentures    upper and lower   Past Surgical History:  Procedure Laterality Date  . ANKLE  FUSION Right 2007  . CARPAL TUNNEL RELEASE Left 12/28/2009   w/ pulley release left long finger  . CARPAL TUNNEL RELEASE Right 07/22/2013   Procedure: RIGHT CARPAL TUNNEL RELEASE;  Surgeon: Cammie Sickle., MD;  Location: [facility];  Service: Orthopedics;  Laterality: Right;  . COLONOSCOPY    . CYSTO/  LEFT RETROGRADE PYELOGRAM  11/21/2010  . CYSTOSCOPY WITH STENT PLACEMENT Left 03/09/2016   Procedure: CYSTOSCOPY WITH STENT PLACEMENT;  Surgeon: Franchot Gallo, MD;  Location: [facility];  Service: Urology;  Laterality: Left;  . CYSTOSCOPY/RETROGRADE/URETEROSCOPY/STONE EXTRACTION WITH BASKET Left 03/09/2016   Procedure: CYSTOSCOPY/RETROGRADE/URETEROSCOPY/STONE EXTRACTION WITH BASKET;  Surgeon: Franchot Gallo, MD;  Location[facility];  Service: Urology;  Laterality: Left;  . LEFT URETEROSCOPIC LASER LITHOTRIPSY STONE EXTRACTION/ STENT PLACEMENT  05/23/2010  . MOHS SURGERY     TOP OF THE HEAD   . ORIF ANKLE FRACTURE Right 1978  . PORT-A-CATH REMOVAL Right 02/14/2019   Procedure: MINOR REMOVAL PORT-A-CATH;  Surgeon: Aviva Signs, MD;  Location: [facility];  Service: General;  Laterality: Right;  . PORTACATH PLACEMENT Right 08/19/2018   Procedure: INSERTION PORT-A-CATH (attached catheter  in right subclavian);  Surgeon: Aviva Signs, MD;  Location: [facility];  Service: General;  Laterality: Right;  . POSTERIOR LUMBAR FUSION  08/21/2014   laminectomy and decompression L2 -- L5  . RIGHT LOWER LEG SURGERY  X3  1975 to 1976   including ORIF  . TONSILLECTOMY AND ADENOIDECTOMY  1986  . UMBILICAL HERNIA REPAIR  2009 approx     SOCIAL HISTORY:  Social History   Socioeconomic History  . Marital status: Married    Spouse name: Not on file  . Number of children: Not on file  . Years of education: Not on file  . Highest education level: Not on file  Occupational History  . Not on file  Tobacco Use  . Smoking status: Former Smoker    Years: 20.00     Types: Cigarettes    Quit date: 07/17/1986    Years since quitting: 32.8  . Smokeless tobacco: Never Used  Substance and Sexual Activity  . Alcohol use: Yes    Alcohol/week: 7.0 - 14.0 standard drinks    Types: 7 - 14 Cans of beer per week    Comment: 1 -2 beer daily  . Drug use: No  . Sexual activity: Not Currently  Other Topics Concern  . Not on file  Social History Narrative  . Not on file   Social Determinants of Health   Financial Resource Strain: Unknown  . Difficulty of Paying Living Expenses: Patient refused  Food Insecurity: Unknown  . Worried About Charity fundraiser in the Last Year: Patient refused  . Ran Out of Food in the Last Year: Patient refused  Transportation Needs: Unknown  . Lack of Transportation (Medical): Patient refused  . Lack of Transportation (Non-Medical): Patient refused  Physical Activity: Unknown  . Days of Exercise per Week: Patient refused  . Minutes of Exercise per Session: Patient refused  Stress: Unknown  . Feeling of Stress : Patient refused  Social Connections: Unknown  . Frequency of Communication with Friends and Family: Patient refused  . Frequency of Social Gatherings with Friends and Family: Patient refused  . Attends Religious Services: Patient refused  . Active Member of Clubs or Organizations: Patient refused  . Attends Archivist Meetings: Patient refused  . Marital Status: Patient refused  Intimate Partner Violence: Unknown  . Fear of Current or Ex-Partner: Patient refused  . Emotionally Abused: Patient refused  . Physically Abused: Patient refused  . Sexually Abused: Patient refused    FAMILY HISTORY:  Family History  Problem Relation Age of Onset  . Stroke Mother   . Prostate cancer Father   . Bone cancer Father   . Diverticulitis Father   . Rheum arthritis Sister   . Urinary tract infection Sister   . Colon cancer Neg Hx     CURRENT MEDICATIONS:  Outpatient Encounter Medications as of  04/28/2019  Medication Sig  . allopurinol (ZYLOPRIM) 300 MG tablet Take 300 mg by mouth every evening.  . cetirizine (ZYRTEC) 10 MG tablet Take 10 mg by mouth daily. IN THE MORNING  . docusate sodium (COLACE) 100 MG capsule Take 100 mg by mouth at bedtime.   . hydrochlorothiazide (HYDRODIURIL) 12.5 MG tablet Take 12.5 mg by mouth every morning.  . lansoprazole (PREVACID) 15 MG capsule Take 15 mg by mouth daily.   Marland Kitchen lisinopril (ZESTRIL) 2.5 MG tablet Take 2.5 mg by mouth daily.  . magnesium oxide (MAG-OX) 400 MG tablet Take 400 mg by mouth at bedtime.   Marland Kitchen  tamsulosin (FLOMAX) 0.4 MG CAPS capsule Take 0.4 mg by mouth daily.  Marland Kitchen venetoclax 100 MG TABS Take 200 mg by mouth daily.  Marland Kitchen acetaminophen (TYLENOL) 500 MG tablet Take 1,000 mg by mouth every 6 (six) hours as needed for mild pain or moderate pain.   Marland Kitchen diclofenac sodium (VOLTAREN) 1 % GEL Apply 1 g topically 3 (three) times daily as needed (knee pain.).   Marland Kitchen lidocaine-prilocaine (EMLA) cream APPLY A PEA SIZED AMOUNT TO PORT A CATH SITE AND COVER WITH PLASTIC WRAP ONE HOUR PRIOR TO CHEMOTHERAPY APPOINTMENT  . traMADol (ULTRAM) 50 MG tablet TAKE 1 TABLET BY MOUTH THREE TIMES DAILY AS NEEDED FOR PAIN (Patient not taking: Reported on 04/28/2019)  . [DISCONTINUED] sodium chloride flush (NS) 0.9 % injection 10 mL    No facility-administered encounter medications on file as of 04/28/2019.    ALLERGIES:  No Known Allergies   PHYSICAL EXAM:  ECOG Performance status: 1  Vitals:   04/28/19 0846  BP: (!) 147/87  Pulse: (!) 51  Resp: 18  Temp: (!) 97.2 F (36.2 C)  SpO2: 95%   There were no vitals filed for this visit.  Physical Exam Constitutional:      Appearance: Normal appearance.  HENT:     Mouth/Throat:     Mouth: Mucous membranes are moist.     Pharynx: Oropharynx is clear.  Eyes:     Extraocular Movements: Extraocular movements intact.     Conjunctiva/sclera: Conjunctivae normal.  Cardiovascular:     Rate and Rhythm: Normal  rate and regular rhythm.     Heart sounds: Normal heart sounds.  Pulmonary:     Effort: Pulmonary effort is normal.     Breath sounds: Normal breath sounds.  Abdominal:     General: There is no distension.     Palpations: Abdomen is soft. There is no mass.  Musculoskeletal:        General: No deformity. Normal range of motion.     Cervical back: Normal range of motion.  Skin:    General: Skin is warm.  Neurological:     General: No focal deficit present.     Mental Status: He is alert and oriented to person, place, and time.  Psychiatric:        Mood and Affect: Mood normal.        Behavior: Behavior normal.      LABORATORY DATA:  I have reviewed the labs as listed.  CBC    Component Value Date/Time   WBC 3.1 (L) 04/28/2019 0905   RBC 4.51 04/28/2019 0905   HGB 14.4 04/28/2019 0905   HCT 44.7 04/28/2019 0905   PLT 135 (L) 04/28/2019 0905   MCV 99.1 04/28/2019 0905   MCH 31.9 04/28/2019 0905   MCHC 32.2 04/28/2019 0905   RDW 15.2 04/28/2019 0905   LYMPHSABS 1.0 04/28/2019 0905   MONOABS 0.4 04/28/2019 0905   EOSABS 0.1 04/28/2019 0905   BASOSABS 0.0 04/28/2019 0905   CMP Latest Ref Rng & Units 04/28/2019 04/22/2019 04/21/2019  Glucose 70 - 99 mg/dL 118(H) 120(H) 114(H)  BUN 8 - 23 mg/dL 26(H) 21 24(H)  Creatinine 0.61 - 1.24 mg/dL 1.08 1.15 1.11  Sodium 135 - 145 mmol/L 140 139 142  Potassium 3.5 - 5.1 mmol/L 4.4 4.3 3.9  Chloride 98 - 111 mmol/L 104 106 107  CO2 22 - 32 mmol/L _0 Calcium 8.9 - 10.3 mg/dL 9.5 9.6 9.7  Total Protein 6.5 - 8.1 g/dL 6.3(L)  6.9 6.4(L)  Total Bilirubin 0.3 - 1.2 mg/dL 0.7 0.6 0.6  Alkaline Phos 38 - 126 U/L 67 69 61  AST 15 - 41 U/L _0 ALT 0 - 44 U/L _1 ASSESSMENT & PLAN:   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HCC) 1.  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  Flow cytometry-12% blasts.  Chromosome analysis and FISH panel normal. -4 cycles of Vidaza from  08/22/2018 through 12/02/2018. -Repeat bone marrow biopsy on 12/23/2018 at [facility] showed 25% blasts and 90% cellular marrow, consistent with AML with MDS related changes.  Flow cytometry showed 11% blasts.  MDS FISH panel was normal. -NGS panel did not show any targetable mutations. -Cycle 1 of decitabine (20 mg/m2) with venetoclax 300 mg daily on 01/20/2019. -He was hospitalized from 02/09/2019 through 02/18/2019 with neutropenic fever.  His venetoclax was held at that time. -His blood cultures grew Streptococcus Infantarius.  His Port-A-Cath was removed on 02/14/2019.  He also had lung infiltrates with cough which resolved.  He had a PICC line placed in the left arm. -Bone marrow biopsy on 03/05/2019 did not show any evidence of leukemia. -Cycle 2 of decitabine (15 mg/M 2) on 03/17/2019.  Venetoclax was dose reduced to 200 mg, took for 2 weeks until 03/31/2019. -Cycle 3 chemotherapy on 04/22/2019 for 4 days of decitabine 15 mg/m2. -He is taking venetoclax 200 mg daily which he started with decitabine. -We have reviewed his blood work.  White count is 3.1 with platelet count of 135.  ANC is 1600.  I have also reviewed tumor lysis labs which are grossly within normal limits. -We will see him back in 1 week with repeat labs and toxicity assessment.  His next cycle will be in 3 weeks. -If there is any significant change in blood counts, will consider repeating bone marrow biopsy. -We will make a referral to Dr. Arnoldo Morale for port placement.  We will discontinue PICC line once the port is placed.   2.  CKD: -Creatinine improved to 1.08 today.  3.  Bilateral knee pains: -He will continue tramadol 3 times a day as needed along with Tylenol.  4.  Hypomagnesemia: -Magnesium is 1.7 today.  He will continue magnesium once daily.    Orders placed this encounter:  No orders of the defined types were placed in this encounter.     [name], MD [place] [phone]

## 2019-04-28 NOTE — Assessment & Plan Note (Addendum)
1.  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  Flow cytometry-12% blasts.  Chromosome analysis and FISH panel normal. -4 cycles of Vidaza from 08/22/2018 through 12/02/2018. -Repeat bone marrow biopsy on 12/23/2018 at [facility] showed 25% blasts and 90% cellular marrow, consistent with AML with MDS related changes.  Flow cytometry showed 11% blasts.  MDS FISH panel was normal. -NGS panel did not show any targetable mutations. -Cycle 1 of decitabine (20 mg/m2) with venetoclax 300 mg daily on 01/20/2019. -He was hospitalized from 02/09/2019 through 02/18/2019 with neutropenic fever.  His venetoclax was held at that time. -His blood cultures grew Streptococcus Infantarius.  His Port-A-Cath was removed on 02/14/2019.  He also had lung infiltrates with cough which resolved.  He had a PICC line placed in the left arm. -Bone marrow biopsy on 03/05/2019 did not show any evidence of leukemia. -Cycle 2 of decitabine (15 mg/M 2) on 03/17/2019.  Venetoclax was dose reduced to 200 mg, took for 2 weeks until 03/31/2019. -Cycle 3 chemotherapy on 04/22/2019 for 4 days of decitabine 15 mg/m2. -He is taking venetoclax 200 mg daily which he started with decitabine. -We have reviewed his blood work.  White count is 3.1 with platelet count of 135.  ANC is 1600.  I have also reviewed tumor lysis labs which are grossly within normal limits. -We will see him back in 1 week with repeat labs and toxicity assessment.  His next cycle will be in 3 weeks. -If there is any significant change in blood counts, will consider repeating bone marrow biopsy. -We will make a referral to Dr. Arnoldo Morale for port placement.  We will discontinue PICC line once the port is placed.   2.  CKD: -Creatinine improved to 1.08 today.  3.  Bilateral knee pains: -He will continue tramadol 3 times a day as needed along with Tylenol.  4.  Hypomagnesemia: -Magnesium is 1.7 today.  He will continue magnesium once daily.

## 2019-04-28 NOTE — Patient Instructions (Addendum)
[place] at [facility] Discharge Instructions  Labs drawn from PICC line today with dressing changed and flushed per protocol. Follow-up as scheduled. Call clinic for any questions or concerns   Thank you for choosing [place] at [facility] to provide your oncology and hematology care.  To afford each patient quality time with our provider, please arrive at least 15 minutes before your scheduled appointment time.   If you have a lab appointment with the [place] please come in thru the Main Entrance and check in at the main information desk.  You need to re-schedule your appointment should you arrive 10 or more minutes late.  We strive to give you quality time with our providers, and arriving late affects you and other patients whose appointments are after yours.  Also, if you no show three or more times for appointments you may be dismissed from the clinic at the providers discretion.     Again, thank you for choosing [facility].  Our hope is that these requests will decrease the amount of time that you wait before being seen by our physicians.       _____________________________________________________________  Should you have questions after your visit to [facility], please contact our office at [phone] between the hours of 8:00 a.m. and 4:30 p.m.  Voicemails left after 4:00 p.m. will not be returned until the following business day.  For prescription refill requests, have your pharmacy contact our office and allow 72 hours.    Due to Covid, you will need to wear a mask upon entering the hospital. If you do not have a mask, a mask will be given to you at the Main Entrance upon arrival. For doctor visits, patients may have 1 support person with them. For treatment visits, patients can not have anyone with them due to social distancing guidelines and our immunocompromised population.

## 2019-04-28 NOTE — Progress Notes (Signed)
PICC line dressing, antimicrobial patch and secutring device changed per protocol with PICC line flushed per protocol as well. PICC line insertion site without redness or drainage. Pt discharged self ambulatory using his walker in satisfactory condition accompanied by his wife

## 2019-05-06 ENCOUNTER — Inpatient Hospital Stay (HOSPITAL_BASED_OUTPATIENT_CLINIC_OR_DEPARTMENT_OTHER): Payer: Medicare Other | Admitting: Nurse Practitioner

## 2019-05-06 ENCOUNTER — Inpatient Hospital Stay (HOSPITAL_COMMUNITY): Payer: Medicare Other

## 2019-05-06 ENCOUNTER — Encounter (HOSPITAL_COMMUNITY): Payer: Self-pay | Admitting: Nurse Practitioner

## 2019-05-06 ENCOUNTER — Other Ambulatory Visit: Payer: Self-pay

## 2019-05-06 DIAGNOSIS — D61818 Other pancytopenia: Secondary | ICD-10-CM

## 2019-05-06 DIAGNOSIS — C9200 Acute myeloblastic leukemia, not having achieved remission: Secondary | ICD-10-CM

## 2019-05-06 DIAGNOSIS — Z5111 Encounter for antineoplastic chemotherapy: Secondary | ICD-10-CM | POA: Diagnosis not present

## 2019-05-06 LAB — COMPREHENSIVE METABOLIC PANEL
ALT: 37 U/L (ref 0–44)
AST: 24 U/L (ref 15–41)
Albumin: 3.5 g/dL (ref 3.5–5.0)
Alkaline Phosphatase: 61 U/L (ref 38–126)
Anion gap: 10 (ref 5–15)
BUN: 24 mg/dL — ABNORMAL HIGH (ref 8–23)
CO2: 25 mmol/L (ref 22–32)
Calcium: 9.1 mg/dL (ref 8.9–10.3)
Chloride: 106 mmol/L (ref 98–111)
Creatinine, Ser: 0.99 mg/dL (ref 0.61–1.24)
GFR calc Af Amer: >60 mL/min (ref >60)
GFR calc non Af Amer: >60 mL/min (ref >60)
Glucose, Bld: 104 mg/dL — ABNORMAL HIGH (ref 70–99)
Potassium: 4.4 mmol/L (ref 3.5–5.1)
Sodium: 141 mmol/L (ref 135–145)
Total Bilirubin: 0.5 mg/dL (ref 0.3–1.2)
Total Protein: 6.1 g/dL — ABNORMAL LOW (ref 6.5–8.1)

## 2019-05-06 LAB — CBC WITH DIFFERENTIAL/PLATELET
Abs Immature Granulocytes: 0.02 10*3/uL (ref 0.00–0.07)
Basophils Absolute: 0 10*3/uL (ref 0.0–0.1)
Basophils Relative: 0 %
Eosinophils Absolute: 0 10*3/uL (ref 0.0–0.5)
Eosinophils Relative: 0 %
HCT: 44.6 % (ref 39.0–52.0)
Hemoglobin: 14.3 g/dL (ref 13.0–17.0)
Immature Granulocytes: 1 %
Lymphocytes Relative: 45 %
Lymphs Abs: 1.1 10*3/uL (ref 0.7–4.0)
MCH: 31.7 pg (ref 26.0–34.0)
MCHC: 32.1 g/dL (ref 30.0–36.0)
MCV: 98.9 fL (ref 80.0–100.0)
Monocytes Absolute: 0.4 10*3/uL (ref 0.1–1.0)
Monocytes Relative: 16 %
Neutro Abs: 1 10*3/uL — ABNORMAL LOW (ref 1.7–7.7)
Neutrophils Relative %: 38 %
Platelets: 125 10*3/uL — ABNORMAL LOW (ref 150–400)
RBC: 4.51 MIL/uL (ref 4.22–5.81)
RDW: 15.5 % (ref 11.5–15.5)
WBC: 2.6 10*3/uL — ABNORMAL LOW (ref 4.0–10.5)
nRBC: 0 % (ref 0.0–0.2)

## 2019-05-06 NOTE — Patient Instructions (Signed)
[place] at [facility] Discharge Instructions  Follow up in 1 week with labs and office visit   Thank you for choosing [place] at [facility] to provide your oncology and hematology care.  To afford each patient quality time with our provider, please arrive at least 15 minutes before your scheduled appointment time.   If you have a lab appointment with the [place] please come in thru the Main Entrance and check in at the main information desk.  You need to re-schedule your appointment should you arrive 10 or more minutes late.  We strive to give you quality time with our providers, and arriving late affects you and other patients whose appointments are after yours.  Also, if you no show three or more times for appointments you may be dismissed from the clinic at the providers discretion.     Again, thank you for choosing [facility].  Our hope is that these requests will decrease the amount of time that you wait before being seen by our physicians.       _____________________________________________________________  Should you have questions after your visit to [facility], please contact our office at [phone] between the hours of 8:00 a.m. and 4:30 p.m.  Voicemails left after 4:00 p.m. will not be returned until the following business day.  For prescription refill requests, have your pharmacy contact our office and allow 72 hours.    Due to Covid, you will need to wear a mask upon entering the hospital. If you do not have a mask, a mask will be given to you at the Main Entrance upon arrival. For doctor visits, patients may have 1 support person with them. For treatment visits, patients can not have anyone with them due to social distancing guidelines and our immunocompromised population.

## 2019-05-06 NOTE — Assessment & Plan Note (Signed)
1.  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  Flow cytometry 12% blast.  Chromosome analysis and FISH panel normal. -4 cycles of Vidaza from 08/22/2018-12/02/2018. -Repeat bone marrow biopsy on 12/23/2018 at [facility] showed 25% blast and 90% cellular marrow, consistent with AML with MDS related changes.  Flow cytometry showed 11% blasts.  MDS FISH panel was normal. -NGS panel did not show any targetable mutations. -1 cycle of decitabine (20 mcg/m2) with venetoclax 300 mg daily on 01/20/2019. -She was hospitalized from 02/09/2019 through 02/18/2019 with neutropenic fever.  His venetoclax was held at that time. -His blood cultures grew Streptococcus infantarius.  His Port-A-Cath was removed on 02/14/2019.  He also had lung infiltrates with cough which resolved.  He then had a PICC line placed in the left arm. -Bone marrow biopsy on 03/05/2019 did not show any evidence of leukemia. -Cycle 2 of decitabine (15 mcg/M2) on 03/17/2019.  Venetoclax was dose reduced to 200 mg, took for 2 weeks until 03/31/2019. -Cycle 3 of decitabine on 04/22/2019 for 4 days of decitabine (65m/m2) -She is taking venetoclax 20 mg daily which he started with decitabine. -Labs on 05/06/2019 potassium 4.4, creatinine 0.99, LFTs WNL, WBC 2.6, hemoglobin 14.3, platelets 125, ANC 1000 -He sees Dr. JArnoldo Moralenext week for his port placement.  We will discontinue his PICC line once the port is placed. -We will see him back in 1 week with repeat labs  2.  CKD: -Creatinine has improved to 0.99  3.  Bilateral knee pains: -He will continue taking tramadol 3 times a day as needed along with Tylenol.

## 2019-05-06 NOTE — Progress Notes (Signed)
Richard Wallace presented for PICC line flush. PICC line located left upper arm . Good blood return present. PICC line flushed with 42ml NS and 300U/59ml Heparin. Procedure without incident. Patient tolerated procedure well.  Labs drawn per orders.  Dressing change done per protocol.

## 2019-05-06 NOTE — Progress Notes (Signed)
Richard Wallace, [place] [id number]   CLINIC:  Medical Oncology/Hematology  PCP:  [name], MD [address] [place] [address] [phone]   REASON FOR VISIT: Follow-up for AML  CURRENT THERAPY: Venetoclax and decitabine  BRIEF ONCOLOGIC HISTORY:  Oncology History  MDS (myelodysplastic syndrome), high grade ([place])  08/14/2018 Initial Diagnosis   MDS (myelodysplastic syndrome), high grade ([place])   08/22/2018 - 12/31/2018 Chemotherapy   The patient had palonosetron (ALOXI) injection 0.25 mg, 0.25 mg, Intravenous,  Once, 4 of 6 cycles Administration: 0.25 mg (08/22/2018), 0.25 mg (08/26/2018), 0.25 mg (08/28/2018), 0.25 mg (08/30/2018), 0.25 mg (10/01/2018), 0.25 mg (10/02/2018), 0.25 mg (11/04/2018), 0.25 mg (09/23/2018), 0.25 mg (09/25/2018), 0.25 mg (09/27/2018), 0.25 mg (10/28/2018), 0.25 mg (10/30/2018), 0.25 mg (11/01/2018), 0.25 mg (12/02/2018), 0.25 mg (12/04/2018), 0.25 mg (12/06/2018) azaCITIDine (VIDAZA) 100 mg in sodium chloride 0.9 % 50 mL chemo infusion, 110 mg (66.7 % of original dose 75 mg/m2), Intravenous, Once, 4 of 6 cycles Dose modification: 50 mg/m2 (original dose 75 mg/m2, Cycle 1, Reason: Provider Judgment), 50 mg/m2 (original dose 75 mg/m2, Cycle 2, Reason: Provider Judgment) Administration: 100 mg (08/22/2018), 100 mg (08/23/2018), 100 mg (08/26/2018), 100 mg (08/27/2018), 100 mg (08/28/2018), 100 mg (08/29/2018), 100 mg (08/30/2018), 100 mg (10/01/2018), 100 mg (10/02/2018), 100 mg (11/04/2018), 100 mg (11/05/2018), 100 mg (09/23/2018), 100 mg (09/24/2018), 100 mg (09/25/2018), 100 mg (09/26/2018), 100 mg (09/27/2018), 100 mg (10/28/2018), 100 mg (10/29/2018), 100 mg (10/30/2018), 100 mg (10/31/2018), 100 mg (11/01/2018), 100 mg (12/02/2018), 100 mg (12/03/2018), 100 mg (12/04/2018), 100 mg (12/05/2018), 100 mg (12/06/2018)  for chemotherapy treatment.    01/20/2019 -  Chemotherapy   The patient had decitabine (DACOGEN) 45 mg in sodium chloride 0.9 % 50 mL chemo  infusion, 20 mg/m2 = 45 mg, Intravenous,  Once, 3 of 4 cycles Dose modification: 15 mg/m2 (original dose 20 mg/m2, Cycle 2, Reason: Other (see comments), Comment: neutropenic fever) Administration: 45 mg (01/20/2019), 45 mg (01/21/2019), 45 mg (01/22/2019), 45 mg (01/23/2019), 45 mg (01/24/2019), 35 mg (03/17/2019), 35 mg (03/18/2019), 35 mg (03/19/2019), 35 mg (03/20/2019), 35 mg (03/21/2019), 35 mg (04/22/2019), 35 mg (04/23/2019), 35 mg (04/24/2019), 35 mg (04/25/2019) ondansetron (ZOFRAN) 8 mg in sodium chloride 0.9 % 50 mL IVPB, 8 mg (100 % of original dose 8 mg), Intravenous,  Once, 3 of 4 cycles Dose modification: 8 mg (original dose 8 mg, Cycle 1) Administration: 8 mg (01/20/2019), 8 mg (01/21/2019), 8 mg (01/22/2019), 8 mg (01/23/2019), 8 mg (01/24/2019), 8 mg (03/17/2019), 8 mg (03/18/2019), 8 mg (03/19/2019), 8 mg (03/20/2019), 8 mg (03/21/2019), 8 mg (04/22/2019), 8 mg (04/23/2019), 8 mg (04/24/2019), 8 mg (04/25/2019)  for chemotherapy treatment.    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[place])  01/07/2019 Initial Diagnosis   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[place])   01/20/2019 -  Chemotherapy   The patient had decitabine (DACOGEN) 45 mg in sodium chloride 0.9 % 50 mL chemo infusion, 20 mg/m2 = 45 mg, Intravenous,  Once, 3 of 4 cycles Dose modification: 15 mg/m2 (original dose 20 mg/m2, Cycle 2, Reason: Other (see comments), Comment: neutropenic fever) Administration: 45 mg (01/20/2019), 45 mg (01/21/2019), 45 mg (01/22/2019), 45 mg (01/23/2019), 45 mg (01/24/2019), 35 mg (03/17/2019), 35 mg (03/18/2019), 35 mg (03/19/2019), 35 mg (03/20/2019), 35 mg (03/21/2019), 35 mg (04/22/2019), 35 mg (04/23/2019), 35 mg (04/24/2019), 35 mg (04/25/2019) ondansetron (ZOFRAN) 8 mg in sodium chloride 0.9 % 50 mL IVPB, 8 mg (100 % of original dose 8 mg),  Intravenous,  Once, 3 of 4 cycles Dose modification: 8 mg (original dose 8 mg, Cycle 1) Administration: 8 mg (01/20/2019), 8 mg (01/21/2019), 8 mg (01/22/2019), 8 mg (01/23/2019),  8 mg (01/24/2019), 8 mg (03/17/2019), 8 mg (03/18/2019), 8 mg (03/19/2019), 8 mg (03/20/2019), 8 mg (03/21/2019), 8 mg (04/22/2019), 8 mg (04/23/2019), 8 mg (04/24/2019), 8 mg (04/25/2019)  for chemotherapy treatment.      INTERVAL HISTORY:  Richard Wallace 73 y.o. male returns for routine follow-up for AML.  Patient reports he has been doing well since his last visit.  He does have mild lower leg edema otherwise he has no complaints at this time. Denies any nausea, vomiting, or diarrhea. Denies any new pains. Had not noticed any recent bleeding such as epistaxis, hematuria or hematochezia. Denies recent chest pain on exertion, shortness of breath on minimal exertion, pre-syncopal episodes, or palpitations. Denies any numbness or tingling in hands or feet. Denies any recent fevers, infections, or recent hospitalizations. Patient reports appetite at 100% and energy level at 50%.  He is eating well maintain his weight at this time.    REVIEW OF SYSTEMS:  Review of Systems  Cardiovascular: Positive for leg swelling.  All other systems reviewed and are negative.    PAST MEDICAL/SURGICAL HISTORY:  Past Medical History:  Diagnosis Date  . Arthritis   . 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 Cancer ([place]) 01-28-2014   skin cancer  . CKD (chronic kidney disease), stage III   . GERD (gastroesophageal reflux disease)   . Heart murmur    NOTED DURING PHYSICAL WHEN HE WAS ENLISTING IN [place] , DIDNT KNOW UNTIL THAT TIME AND REPORTS , "THATS THE LAST I HEARD ABOUT IT "   . History of hypertension    no longer issue  . History of kidney stones   . History of malignant melanoma of skin    excision top of scalp 2015-- no recurrence  . History of urinary retention    post op lumbar fusion surgery 04/ 2016  . Hypertension   . 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 Left ureteral calculus   . Seasonal allergies   . Wears  glasses   . Wears glasses   . Wears partial dentures    upper and lower   Past Surgical History:  Procedure Laterality Date  . ANKLE FUSION Right 2007  . CARPAL TUNNEL RELEASE Left 12/28/2009   w/ pulley release left long finger  . CARPAL TUNNEL RELEASE Right 07/22/2013   Procedure: RIGHT CARPAL TUNNEL RELEASE;  Surgeon: Cammie Sickle., MD;  Location: [facility];  Service: Orthopedics;  Laterality: Right;  . COLONOSCOPY    . CYSTO/  LEFT RETROGRADE PYELOGRAM  11/21/2010  . CYSTOSCOPY WITH STENT PLACEMENT Left 03/09/2016   Procedure: CYSTOSCOPY WITH STENT PLACEMENT;  Surgeon: Franchot Gallo, MD;  Location: [facility];  Service: Urology;  Laterality: Left;  . CYSTOSCOPY/RETROGRADE/URETEROSCOPY/STONE EXTRACTION WITH BASKET Left 03/09/2016   Procedure: CYSTOSCOPY/RETROGRADE/URETEROSCOPY/STONE EXTRACTION WITH BASKET;  Surgeon: Franchot Gallo, MD;  Location: [facility];  Service: Urology;  Laterality: Left;  . LEFT URETEROSCOPIC LASER LITHOTRIPSY STONE EXTRACTION/ STENT PLACEMENT  05/23/2010  . MOHS SURGERY     TOP OF THE HEAD   . ORIF ANKLE FRACTURE Right 1978  . PORT-A-CATH REMOVAL Right 02/14/2019   Procedure: MINOR REMOVAL PORT-A-CATH;  Surgeon: Aviva Signs, MD;  Location:  [facility];  Service: General;  Laterality: Right;  . PORTACATH PLACEMENT Right 08/19/2018   Procedure: INSERTION PORT-A-CATH (attached catheter in right subclavian);  Surgeon: Aviva Signs, MD;  Location: [facility];  Service: General;  Laterality: Right;  . POSTERIOR LUMBAR FUSION  08/21/2014   laminectomy and decompression L2 -- L5  . RIGHT LOWER LEG SURGERY  X3  1975 to 1976   including ORIF  . TONSILLECTOMY AND ADENOIDECTOMY  1986  . UMBILICAL HERNIA REPAIR  2009 approx     SOCIAL HISTORY:  Social History   Socioeconomic History  . Marital status: Married    Spouse name: Not on file  . Number of children: Not on file  . Years of education: Not on file  .  Highest education level: Not on file  Occupational History  . Not on file  Tobacco Use  . Smoking status: Former Smoker    Years: 20.00    Types: Cigarettes    Quit date: 07/17/1986    Years since quitting: 32.8  . Smokeless tobacco: Never Used  Substance and Sexual Activity  . Alcohol use: Yes    Alcohol/week: 7.0 - 14.0 standard drinks    Types: 7 - 14 Cans of beer per week    Comment: 1 -2 beer daily  . Drug use: No  . Sexual activity: Not Currently  Other Topics Concern  . Not on file  Social History Narrative  . Not on file   Social Determinants of Health   Financial Resource Strain: Unknown  . Difficulty of Paying Living Expenses: Patient refused  Food Insecurity: Unknown  . Worried About Charity fundraiser in the Last Year: Patient refused  . Ran Out of Food in the Last Year: Patient refused  Transportation Needs: Unknown  . Lack of Transportation (Medical): Patient refused  . Lack of Transportation (Non-Medical): Patient refused  Physical Activity: Unknown  . Days of Exercise per Week: Patient refused  . Minutes of Exercise per Session: Patient refused  Stress: Unknown  . Feeling of Stress : Patient refused  Social Connections: Unknown  . Frequency of Communication with Friends and Family: Patient refused  . Frequency of Social Gatherings with Friends and Family: Patient refused  . Attends Religious Services: Patient refused  . Active Member of Clubs or Organizations: Patient refused  . Attends Archivist Meetings: Patient refused  . Marital Status: Patient refused  Intimate Partner Violence: Unknown  . Fear of Current or Ex-Partner: Patient refused  . Emotionally Abused: Patient refused  . Physically Abused: Patient refused  . Sexually Abused: Patient refused    FAMILY HISTORY:  Family History  Problem Relation Age of Onset  . Stroke Mother   . Prostate cancer Father   . Bone cancer Father   . Diverticulitis Father   . Rheum arthritis  Sister   . Urinary tract infection Sister   . Colon cancer Neg Hx     CURRENT MEDICATIONS:  Outpatient Encounter Medications as of 05/06/2019  Medication Sig  . allopurinol (ZYLOPRIM) 300 MG tablet Take 300 mg by mouth every evening.  . cetirizine (ZYRTEC) 10 MG tablet Take 10 mg by mouth daily. IN THE MORNING  . docusate sodium (COLACE) 100 MG capsule Take 100 mg by mouth at bedtime.   . hydrochlorothiazide (HYDRODIURIL) 12.5 MG tablet Take 12.5 mg by mouth every morning.  . lansoprazole (PREVACID) 15 MG capsule Take 15 mg by mouth daily.   Marland Kitchen lisinopril (ZESTRIL) 2.5 MG tablet Take 2.5  mg by mouth daily.  . magnesium oxide (MAG-OX) 400 MG tablet Take 400 mg by mouth at bedtime.   . tamsulosin (FLOMAX) 0.4 MG CAPS capsule Take 0.4 mg by mouth daily.  Marland Kitchen venetoclax 100 MG TABS Take 200 mg by mouth daily.  Marland Kitchen acetaminophen (TYLENOL) 500 MG tablet Take 1,000 mg by mouth every 6 (six) hours as needed for mild pain or moderate pain.   Marland Kitchen diclofenac sodium (VOLTAREN) 1 % GEL Apply 1 g topically 3 (three) times daily as needed (knee pain.).   Marland Kitchen lidocaine-prilocaine (EMLA) cream APPLY A PEA SIZED AMOUNT TO PORT A CATH SITE AND COVER WITH PLASTIC WRAP ONE HOUR PRIOR TO CHEMOTHERAPY APPOINTMENT  . traMADol (ULTRAM) 50 MG tablet TAKE 1 TABLET BY MOUTH THREE TIMES DAILY AS NEEDED FOR PAIN (Patient not taking: Reported on 04/28/2019)   No facility-administered encounter medications on file as of 05/06/2019.    ALLERGIES:  No Known Allergies   PHYSICAL EXAM:  ECOG Performance status: 1  Vitals:   05/06/19 0909  BP: 118/80  Pulse: 83  Resp: 18  Temp: (!) 97.3 F (36.3 C)  SpO2: 97%   Filed Weights   05/06/19 0909  Weight: 226 lb (102.5 kg)    Physical Exam Constitutional:      Appearance: Normal appearance. He is normal weight.  Cardiovascular:     Rate and Rhythm: Normal rate and regular rhythm.     Heart sounds: Normal heart sounds.  Pulmonary:     Effort: Pulmonary effort is  normal.     Breath sounds: Normal breath sounds.  Abdominal:     General: Bowel sounds are normal.     Palpations: Abdomen is soft.  Musculoskeletal:        General: Normal range of motion.  Skin:    General: Skin is warm.  Neurological:     Mental Status: He is alert and oriented to person, place, and time. Mental status is at baseline.  Psychiatric:        Mood and Affect: Mood normal.        Behavior: Behavior normal.        Thought Content: Thought content normal.        Judgment: Judgment normal.      LABORATORY DATA:  I have reviewed the labs as listed.  CBC    Component Value Date/Time   WBC 2.6 (L) 05/06/2019 0848   RBC 4.51 05/06/2019 0848   HGB 14.3 05/06/2019 0848   HCT 44.6 05/06/2019 0848   PLT 125 (L) 05/06/2019 0848   MCV 98.9 05/06/2019 0848   MCH 31.7 05/06/2019 0848   MCHC 32.1 05/06/2019 0848   RDW 15.5 05/06/2019 0848   LYMPHSABS 1.1 05/06/2019 0848   MONOABS 0.4 05/06/2019 0848   EOSABS 0.0 05/06/2019 0848   BASOSABS 0.0 05/06/2019 0848   CMP Latest Ref Rng & Units 05/06/2019 04/28/2019 04/22/2019  Glucose 70 - 99 mg/dL 104(H) 118(H) 120(H)  BUN 8 - 23 mg/dL 24(H) 26(H) 21  Creatinine 0.61 - 1.24 mg/dL 0.99 1.08 1.15  Sodium 135 - 145 mmol/L 141 140 139  Potassium 3.5 - 5.1 mmol/L 4.4 4.4 4.3  Chloride 98 - 111 mmol/L 106 104 106  CO2 22 - 32 mmol/L '25 26 25  ' Calcium 8.9 - 10.3 mg/dL 9.1 9.5 9.6  Total Protein 6.5 - 8.1 g/dL 6.1(L) 6.3(L) 6.9  Total Bilirubin 0.3 - 1.2 mg/dL 0.5 0.7 0.6  Alkaline Phos 38 - 126 U/L 61 67 69  AST 15 - 41 U/L '24 21 20  ' ALT 0 - 44 U/L 37 22 24     I personally performed a face-to-face visit.  All questions were answered to patient's stated satisfaction. Encouraged patient to call with any new concerns or questions before his next visit to the cancer center and we can certain see him sooner, if needed.     ASSESSMENT & PLAN:   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[place]) 1.  Acute myeloid leukemia: -Diagnosed  with high-grade MDS on 07/30/2018, MDS-EB 2.  Flow cytometry 12% blast.  Chromosome analysis and FISH panel normal. -4 cycles of Vidaza from 08/22/2018-12/02/2018. -Repeat bone marrow biopsy on 12/23/2018 at [facility] showed 25% blast and 90% cellular marrow, consistent with AML with MDS related changes.  Flow cytometry showed 11% blasts.  MDS FISH panel was normal. -NGS panel did not show any targetable mutations. -1 cycle of decitabine (20 mcg/m2) with venetoclax 300 mg daily on 01/20/2019. -She was hospitalized from 02/09/2019 through 02/18/2019 with neutropenic fever.  His venetoclax was held at that time. -His blood cultures grew Streptococcus infantarius.  His Port-A-Cath was removed on 02/14/2019.  He also had lung infiltrates with cough which resolved.  He then had a PICC line placed in the left arm. -Bone marrow biopsy on 03/05/2019 did not show any evidence of leukemia. -Cycle 2 of decitabine (15 mcg/M2) on 03/17/2019.  Venetoclax was dose reduced to 200 mg, took for 2 weeks until 03/31/2019. -Cycle 3 of decitabine on 04/22/2019 for 4 days of decitabine (79m/m2) -She is taking venetoclax 20 mg daily which he started with decitabine. -Labs on 05/06/2019 potassium 4.4, creatinine 0.99, LFTs WNL, WBC 2.6, hemoglobin 14.3, platelets 125, ANC 1000 -He sees Dr. JArnoldo Moralenext week for his port placement.  We will discontinue his PICC line once the port is placed. -We will see him back in 1 week with repeat labs  2.  CKD: -Creatinine has improved to 0.99  3.  Bilateral knee pains: -He will continue taking tramadol 3 times a day as needed along with Tylenol.        Orders placed this encounter:  Orders Placed This Encounter  Procedures  . Lactate dehydrogenase  . Phosphorus  . Magnesium  . CBC with Differential/Platelet  . Comprehensive metabolic panel  . Uric acid      RFrancene Finders FNP-C ASt [facility] Charity [facility] [phone]

## 2019-05-13 ENCOUNTER — Ambulatory Visit (INDEPENDENT_AMBULATORY_CARE_PROVIDER_SITE_OTHER): Payer: Medicare Other | Admitting: General Surgery

## 2019-05-13 ENCOUNTER — Other Ambulatory Visit: Payer: Self-pay

## 2019-05-13 ENCOUNTER — Inpatient Hospital Stay (HOSPITAL_COMMUNITY): Payer: Medicare Other | Attending: Hematology

## 2019-05-13 ENCOUNTER — Encounter (HOSPITAL_COMMUNITY): Payer: Self-pay

## 2019-05-13 ENCOUNTER — Encounter: Payer: Self-pay | Admitting: General Surgery

## 2019-05-13 VITALS — BP 154/101 | HR 79 | Temp 98.1°F | Resp 16 | Ht 69.5 in | Wt 225.0 lb

## 2019-05-13 VITALS — BP 128/75 | HR 77 | Temp 97.6°F | Resp 20

## 2019-05-13 DIAGNOSIS — M25561 Pain in right knee: Secondary | ICD-10-CM | POA: Diagnosis not present

## 2019-05-13 DIAGNOSIS — M25562 Pain in left knee: Secondary | ICD-10-CM | POA: Insufficient documentation

## 2019-05-13 DIAGNOSIS — Z452 Encounter for adjustment and management of vascular access device: Secondary | ICD-10-CM

## 2019-05-13 DIAGNOSIS — I129 Hypertensive chronic kidney disease with stage 1 through stage 4 chronic kidney disease, or unspecified chronic kidney disease: Secondary | ICD-10-CM | POA: Diagnosis not present

## 2019-05-13 DIAGNOSIS — C9200 Acute myeloblastic leukemia, not having achieved remission: Secondary | ICD-10-CM | POA: Diagnosis present

## 2019-05-13 DIAGNOSIS — Z5111 Encounter for antineoplastic chemotherapy: Secondary | ICD-10-CM | POA: Diagnosis present

## 2019-05-13 DIAGNOSIS — Z79899 Other long term (current) drug therapy: Secondary | ICD-10-CM | POA: Diagnosis not present

## 2019-05-13 DIAGNOSIS — N183 Chronic kidney disease, stage 3 unspecified: Secondary | ICD-10-CM | POA: Diagnosis not present

## 2019-05-13 DIAGNOSIS — D61818 Other pancytopenia: Secondary | ICD-10-CM

## 2019-05-13 LAB — CBC WITH DIFFERENTIAL/PLATELET
Abs Immature Granulocytes: 0 10*3/uL (ref 0.00–0.07)
Basophils Absolute: 0 10*3/uL (ref 0.0–0.1)
Basophils Relative: 1 %
Eosinophils Absolute: 0 10*3/uL (ref 0.0–0.5)
Eosinophils Relative: 1 %
HCT: 46.5 % (ref 39.0–52.0)
Hemoglobin: 15 g/dL (ref 13.0–17.0)
Immature Granulocytes: 0 %
Lymphocytes Relative: 60 %
Lymphs Abs: 0.9 10*3/uL (ref 0.7–4.0)
MCH: 31.9 pg (ref 26.0–34.0)
MCHC: 32.3 g/dL (ref 30.0–36.0)
MCV: 98.9 fL (ref 80.0–100.0)
Monocytes Absolute: 0.1 10*3/uL (ref 0.1–1.0)
Monocytes Relative: 7 %
Neutro Abs: 0.5 10*3/uL — ABNORMAL LOW (ref 1.7–7.7)
Neutrophils Relative %: 31 %
Platelets: 163 10*3/uL (ref 150–400)
RBC: 4.7 MIL/uL (ref 4.22–5.81)
RDW: 15.8 % — ABNORMAL HIGH (ref 11.5–15.5)
WBC: 1.5 10*3/uL — ABNORMAL LOW (ref 4.0–10.5)
nRBC: 0 % (ref 0.0–0.2)

## 2019-05-13 LAB — COMPREHENSIVE METABOLIC PANEL
ALT: 28 U/L (ref 0–44)
AST: 20 U/L (ref 15–41)
Albumin: 3.7 g/dL (ref 3.5–5.0)
Alkaline Phosphatase: 58 U/L (ref 38–126)
Anion gap: 8 (ref 5–15)
BUN: 25 mg/dL — ABNORMAL HIGH (ref 8–23)
CO2: 26 mmol/L (ref 22–32)
Calcium: 9.4 mg/dL (ref 8.9–10.3)
Chloride: 107 mmol/L (ref 98–111)
Creatinine, Ser: 1.11 mg/dL (ref 0.61–1.24)
GFR calc Af Amer: >60 mL/min (ref >60)
GFR calc non Af Amer: >60 mL/min (ref >60)
Glucose, Bld: 104 mg/dL — ABNORMAL HIGH (ref 70–99)
Potassium: 4 mmol/L (ref 3.5–5.1)
Sodium: 141 mmol/L (ref 135–145)
Total Bilirubin: 0.7 mg/dL (ref 0.3–1.2)
Total Protein: 6.4 g/dL — ABNORMAL LOW (ref 6.5–8.1)

## 2019-05-13 MED ORDER — HEPARIN SOD (PORK) LOCK FLUSH 100 UNIT/ML IV SOLN
250.0000 [IU] | Freq: Once | INTRAVENOUS | Status: AC
  Administered 2019-05-13: 250 [IU] via INTRAVENOUS

## 2019-05-13 MED ORDER — SODIUM CHLORIDE 0.9% FLUSH
10.0000 mL | INTRAVENOUS | Status: DC | PRN
  Administered 2019-05-13: 10 mL via INTRAVENOUS

## 2019-05-13 NOTE — Patient Instructions (Signed)

## 2019-05-13 NOTE — Progress Notes (Signed)
Richard Wallace; [id number]; 1945-05-25   HPI Patient is a 74 year old white male who was referred back to my care by oncology for Port-A-Cath insertion.  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  He now has a PICC line in place and is undergoing ongoing chemotherapy.  He denies any fever or chills at the present time.  He has no pain. Past Medical History:  Diagnosis Date  . Arthritis   . 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 Cancer ([place]) 01-28-2014   skin cancer  . CKD (chronic kidney disease), stage III   . GERD (gastroesophageal reflux disease)   . Heart murmur    NOTED DURING PHYSICAL WHEN HE WAS ENLISTING IN [place] , DIDNT KNOW UNTIL THAT TIME AND REPORTS , "THATS THE LAST I HEARD ABOUT IT "   . History of hypertension    no longer issue  . History of kidney stones   . History of malignant melanoma of skin    excision top of scalp 2015-- no recurrence  . History of urinary retention    post op lumbar fusion surgery 04/ 2016  . Hypertension   . 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 Left ureteral calculus   . Seasonal allergies   . Wears glasses   . Wears glasses   . Wears partial dentures    upper and lower    Past Surgical History:  Procedure Laterality Date  . ANKLE FUSION Right 2007  . CARPAL TUNNEL RELEASE Left 12/28/2009   w/ pulley release left long finger  . CARPAL TUNNEL RELEASE Right 07/22/2013   Procedure: RIGHT CARPAL TUNNEL RELEASE;  Surgeon: Cammie Sickle., MD;  Location: [place];  Service: Orthopedics;  Laterality: Right;  . COLONOSCOPY    . CYSTO/  LEFT RETROGRADE PYELOGRAM  11/21/2010  . CYSTOSCOPY WITH STENT PLACEMENT Left 03/09/2016   Procedure: CYSTOSCOPY WITH STENT PLACEMENT;  Surgeon: Franchot Gallo, MD;  Location: [facility];  Service: Urology;  Laterality: Left;  . CYSTOSCOPY/RETROGRADE/URETEROSCOPY/STONE EXTRACTION WITH BASKET Left 03/09/2016   Procedure: CYSTOSCOPY/RETROGRADE/URETEROSCOPY/STONE EXTRACTION WITH BASKET;  Surgeon: Franchot Gallo, MD;  Location: [facility];  Service: Urology;  Laterality: Left;  . LEFT URETEROSCOPIC LASER LITHOTRIPSY STONE EXTRACTION/ STENT PLACEMENT  05/23/2010  . MOHS SURGERY     TOP OF THE HEAD   . ORIF ANKLE FRACTURE Right 1978  . PORT-A-CATH REMOVAL Right 02/14/2019   Procedure: MINOR REMOVAL PORT-A-CATH;  Surgeon: Aviva Signs, MD;  Location: [facility];  Service: General;  Laterality: Right;  . PORTACATH PLACEMENT Right 08/19/2018   Procedure: INSERTION PORT-A-CATH (attached catheter in right subclavian);  Surgeon: Aviva Signs, MD;  Location: [facility];  Service: General;  Laterality: Right;  . POSTERIOR LUMBAR FUSION  08/21/2014   laminectomy and decompression L2 -- L5  . RIGHT LOWER LEG SURGERY  X3  1975 to 1976   including ORIF  . TONSILLECTOMY AND ADENOIDECTOMY  1986  . UMBILICAL HERNIA REPAIR  2009 approx    Family History  Problem Relation Age of Onset  . Stroke Mother   . Prostate cancer Father   . Bone cancer Father   . Diverticulitis Father   . Rheum arthritis Sister   . Urinary tract infection Sister   . Colon cancer Neg Hx  Current Outpatient Medications on File Prior to Visit  Medication Sig Dispense Refill  . acetaminophen (TYLENOL) 500 MG tablet Take 1,000 mg by mouth every 6 (six) hours as needed for mild pain or moderate pain.     Marland Kitchen allopurinol (ZYLOPRIM) 300 MG tablet Take 300 mg by mouth every evening.    . cetirizine (ZYRTEC) 10 MG tablet Take 10 mg by mouth daily. IN THE MORNING    . diclofenac sodium (VOLTAREN) 1 % GEL Apply 1 g topically 3 (three) times daily as needed (knee pain.).     Marland Kitchen docusate sodium (COLACE) 100 MG capsule Take 100 mg by mouth at bedtime.     . hydrochlorothiazide (HYDRODIURIL) 12.5 MG tablet  Take 12.5 mg by mouth every morning.    . lansoprazole (PREVACID) 15 MG capsule Take 15 mg by mouth daily.     Marland Kitchen lidocaine-prilocaine (EMLA) cream APPLY A PEA SIZED AMOUNT TO PORT A CATH SITE AND COVER WITH PLASTIC WRAP ONE HOUR PRIOR TO CHEMOTHERAPY APPOINTMENT    . lisinopril (ZESTRIL) 2.5 MG tablet Take 2.5 mg by mouth daily.    . magnesium oxide (MAG-OX) 400 MG tablet Take 400 mg by mouth at bedtime.     . tamsulosin (FLOMAX) 0.4 MG CAPS capsule Take 0.4 mg by mouth daily.    . traMADol (ULTRAM) 50 MG tablet TAKE 1 TABLET BY MOUTH THREE TIMES DAILY AS NEEDED FOR PAIN 90 tablet 0  . venetoclax 100 MG TABS Take 200 mg by mouth daily. 60 tablet 0   No current facility-administered medications on file prior to visit.    No Known Allergies  Social History   Substance and Sexual Activity  Alcohol Use Yes  . Alcohol/week: 7.0 - 14.0 standard drinks  . Types: 7 - 14 Cans of beer per week   Comment: 1 -2 beer daily    Social History   Tobacco Use  Smoking Status Former Smoker  . Years: 20.00  . Types: Cigarettes  . Quit date: 07/17/1986  . Years since quitting: 32.8  Smokeless Tobacco Never Used    Review of Systems  Constitutional: Positive for malaise/fatigue.  HENT: Negative.   Eyes: Negative.   Respiratory: Negative.   Cardiovascular: Negative.   Gastrointestinal: Negative.   Genitourinary: Negative.   Musculoskeletal: Negative.   Skin: Negative.   Neurological: Negative.   Endo/Heme/Allergies: Negative.   Psychiatric/Behavioral: Negative.     Objective   Vitals:   05/13/19 1412  BP: (!) 154/101  Pulse: 79  Resp: 16  Temp: 98.1 F (36.7 C)  SpO2: 94%    Physical Exam Vitals reviewed.  Constitutional:      Appearance: Normal appearance. He is not ill-appearing.  HENT:     Head: Normocephalic and atraumatic.  Cardiovascular:     Rate and Rhythm: Normal rate and regular rhythm.     Heart sounds: Normal heart sounds. No murmur. No friction rub. No  gallop.   Pulmonary:     Effort: Pulmonary effort is normal. No respiratory distress.     Breath sounds: Normal breath sounds. No stridor. No wheezing, rhonchi or rales.  Skin:    General: Skin is warm and dry.     Comments: Well-healed right sided old Port-A-Cath site.  Neurological:     Mental Status: He is alert and oriented to person, place, and time.    Oncology notes reviewed Assessment  Acute myelogenous leukemia, need for central venous access Plan   Patient is scheduled for Port-A-Cath insertion on  05/16/2019.  Risks and benefits of the procedure including bleeding, infection, and pneumothorax were fully explained to the patient, who gave informed consent.  I will remove his PICC line after the port is placed.

## 2019-05-13 NOTE — H&P (Signed)
Richard Wallace; [id number]; [birth year]   HPI Patient is a 74 year old white male who was referred back to my care by oncology for Port-A-Cath insertion.  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  He now has a PICC line in place and is undergoing ongoing chemotherapy.  He denies any fever or chills at the present time.  He has no pain. Past Medical History:  Diagnosis Date  . Arthritis   . 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 Cancer ([place]) 01-28-2014   skin cancer  . CKD (chronic kidney disease), stage III   . GERD (gastroesophageal reflux disease)   . Heart murmur    NOTED DURING PHYSICAL WHEN HE WAS ENLISTING IN [place] , DIDNT KNOW UNTIL THAT TIME AND REPORTS , "THATS THE LAST I HEARD ABOUT IT "   . History of hypertension    no longer issue  . History of kidney stones   . History of malignant melanoma of skin    excision top of scalp 2015-- no recurrence  . History of urinary retention    post op lumbar fusion surgery 04/ 2016  . Hypertension   . 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 Left ureteral calculus   . Seasonal allergies   . Wears glasses   . Wears glasses   . Wears partial dentures    upper and lower    Past Surgical History:  Procedure Laterality Date  . ANKLE FUSION Right 2007  . CARPAL TUNNEL RELEASE Left 12/28/2009   w/ pulley release left long finger  . CARPAL TUNNEL RELEASE Right 07/22/2013   Procedure: RIGHT CARPAL TUNNEL RELEASE;  Surgeon: Cammie Sickle., MD;  Location: [place];  Service: Orthopedics;  Laterality: Right;  . COLONOSCOPY    . CYSTO/  LEFT RETROGRADE PYELOGRAM  11/21/2010  . CYSTOSCOPY WITH STENT PLACEMENT Left 03/09/2016   Procedure: CYSTOSCOPY WITH STENT PLACEMENT;  Surgeon: Franchot Gallo, MD;  Location: [facility];  Service: Urology;  Laterality: Left;  . CYSTOSCOPY/RETROGRADE/URETEROSCOPY/STONE EXTRACTION WITH BASKET Left 03/09/2016   Procedure: CYSTOSCOPY/RETROGRADE/URETEROSCOPY/STONE EXTRACTION WITH BASKET;  Surgeon: Franchot Gallo, MD;  Location: [facility];  Service: Urology;  Laterality: Left;  . LEFT URETEROSCOPIC LASER LITHOTRIPSY STONE EXTRACTION/ STENT PLACEMENT  05/23/2010  . MOHS SURGERY     TOP OF THE HEAD   . ORIF ANKLE FRACTURE Right 1978  . PORT-A-CATH REMOVAL Right 02/14/2019   Procedure: MINOR REMOVAL PORT-A-CATH;  Surgeon: Aviva Signs, MD;  Location: [facility];  Service: General;  Laterality: Right;  . PORTACATH PLACEMENT Right 08/19/2018   Procedure: INSERTION PORT-A-CATH (attached catheter in right subclavian);  Surgeon: Aviva Signs, MD;  Location: [facility];  Service: General;  Laterality: Right;  . POSTERIOR LUMBAR FUSION  08/21/2014   laminectomy and decompression L2 -- L5  . RIGHT LOWER LEG SURGERY  X3  1975 to 1976   including ORIF  . TONSILLECTOMY AND ADENOIDECTOMY  1986  . UMBILICAL HERNIA REPAIR  2009 approx    Family History  Problem Relation Age of Onset  . Stroke Mother   . Prostate cancer Father   . Bone cancer Father   . Diverticulitis Father   . Rheum arthritis Sister   . Urinary tract infection Sister   . Colon cancer Neg Hx  Current Outpatient Medications on File Prior to Visit  Medication Sig Dispense Refill  . acetaminophen (TYLENOL) 500 MG tablet Take 1,000 mg by mouth every 6 (six) hours as needed for mild pain or moderate pain.     Marland Kitchen allopurinol (ZYLOPRIM) 300 MG tablet Take 300 mg by mouth every evening.    . cetirizine (ZYRTEC) 10 MG tablet Take 10 mg by mouth daily. IN THE MORNING    . diclofenac sodium (VOLTAREN) 1 % GEL Apply 1 g topically 3 (three) times daily as needed (knee pain.).     Marland Kitchen docusate sodium (COLACE) 100 MG capsule Take 100 mg by mouth at bedtime.     . hydrochlorothiazide (HYDRODIURIL) 12.5 MG tablet  Take 12.5 mg by mouth every morning.    . lansoprazole (PREVACID) 15 MG capsule Take 15 mg by mouth daily.     Marland Kitchen lidocaine-prilocaine (EMLA) cream APPLY A PEA SIZED AMOUNT TO PORT A CATH SITE AND COVER WITH PLASTIC WRAP ONE HOUR PRIOR TO CHEMOTHERAPY APPOINTMENT    . lisinopril (ZESTRIL) 2.5 MG tablet Take 2.5 mg by mouth daily.    . magnesium oxide (MAG-OX) 400 MG tablet Take 400 mg by mouth at bedtime.     . tamsulosin (FLOMAX) 0.4 MG CAPS capsule Take 0.4 mg by mouth daily.    . traMADol (ULTRAM) 50 MG tablet TAKE 1 TABLET BY MOUTH THREE TIMES DAILY AS NEEDED FOR PAIN 90 tablet 0  . venetoclax 100 MG TABS Take 200 mg by mouth daily. 60 tablet 0   No current facility-administered medications on file prior to visit.    No Known Allergies  Social History   Substance and Sexual Activity  Alcohol Use Yes  . Alcohol/week: 7.0 - 14.0 standard drinks  . Types: 7 - 14 Cans of beer per week   Comment: 1 -2 beer daily    Social History   Tobacco Use  Smoking Status Former Smoker  . Years: 20.00  . Types: Cigarettes  . Quit date: 07/17/1986  . Years since quitting: 32.8  Smokeless Tobacco Never Used    Review of Systems  Constitutional: Positive for malaise/fatigue.  HENT: Negative.   Eyes: Negative.   Respiratory: Negative.   Cardiovascular: Negative.   Gastrointestinal: Negative.   Genitourinary: Negative.   Musculoskeletal: Negative.   Skin: Negative.   Neurological: Negative.   Endo/Heme/Allergies: Negative.   Psychiatric/Behavioral: Negative.     Objective   Vitals:   05/13/19 1412  BP: (!) 154/101  Pulse: 79  Resp: 16  Temp: 98.1 F (36.7 C)  SpO2: 94%    Physical Exam Vitals reviewed.  Constitutional:      Appearance: Normal appearance. He is not ill-appearing.  HENT:     Head: Normocephalic and atraumatic.  Cardiovascular:     Rate and Rhythm: Normal rate and regular rhythm.     Heart sounds: Normal heart sounds. No murmur. No friction rub. No  gallop.   Pulmonary:     Effort: Pulmonary effort is normal. No respiratory distress.     Breath sounds: Normal breath sounds. No stridor. No wheezing, rhonchi or rales.  Skin:    General: Skin is warm and dry.     Comments: Well-healed right sided old Port-A-Cath site.  Neurological:     Mental Status: He is alert and oriented to person, place, and time.    Oncology notes reviewed Assessment  Acute myelogenous leukemia, need for central venous access Plan   Patient is scheduled for Port-A-Cath insertion on  05/16/2019.  Risks and benefits of the procedure including bleeding, infection, and pneumothorax were fully explained to the patient, who gave informed consent.  I will remove his PICC line after the port is placed.

## 2019-05-14 ENCOUNTER — Other Ambulatory Visit (HOSPITAL_COMMUNITY)
Admission: RE | Admit: 2019-05-14 | Discharge: 2019-05-14 | Disposition: A | Discharge status: Home / Self Care | Payer: Medicare Other | Source: Ambulatory Visit | Attending: General Surgery | Admitting: General Surgery

## 2019-05-14 ENCOUNTER — Encounter (HOSPITAL_COMMUNITY)
Admission: RE | Admit: 2019-05-14 | Discharge: 2019-05-14 | Disposition: A | Discharge status: Home / Self Care | Payer: Medicare Other | Source: Ambulatory Visit | Attending: General Surgery | Admitting: General Surgery

## 2019-05-14 DIAGNOSIS — Z20822 Contact with and (suspected) exposure to covid-19: Secondary | ICD-10-CM | POA: Diagnosis not present

## 2019-05-14 DIAGNOSIS — Z01812 Encounter for preprocedural laboratory examination: Secondary | ICD-10-CM | POA: Diagnosis present

## 2019-05-14 LAB — SARS CORONAVIRUS 2 (TAT 6-24 HRS): SARS Coronavirus 2: NEGATIVE

## 2019-05-15 ENCOUNTER — Inpatient Hospital Stay (HOSPITAL_BASED_OUTPATIENT_CLINIC_OR_DEPARTMENT_OTHER): Payer: Medicare Other | Admitting: Hematology

## 2019-05-15 ENCOUNTER — Other Ambulatory Visit: Payer: Self-pay

## 2019-05-15 VITALS — BP 108/62 | HR 78 | Temp 97.4°F | Resp 18 | Wt 222.6 lb

## 2019-05-15 DIAGNOSIS — C9200 Acute myeloblastic leukemia, not having achieved remission: Secondary | ICD-10-CM

## 2019-05-15 DIAGNOSIS — Z5111 Encounter for antineoplastic chemotherapy: Secondary | ICD-10-CM | POA: Diagnosis not present

## 2019-05-15 NOTE — Progress Notes (Signed)
Richard [address] Richard [place]   CLINIC:  Medical Oncology/Hematology  PCP:  [name], MD [place] [address] [phone]   REASON FOR VISIT:  Follow-up for newly diagnosed AML.  CURRENT THERAPY: Decitabine and venetoclax  BRIEF ONCOLOGIC HISTORY:  Oncology History  MDS (myelodysplastic syndrome), high grade (HCC)  08/14/2018 Initial Diagnosis   MDS (myelodysplastic syndrome), high grade ([place])   08/22/2018 - 12/31/2018 Chemotherapy   The patient had palonosetron (ALOXI) injection 0.25 mg, 0.25 mg, Intravenous,  Once, 4 of 6 cycles Administration: 0.25 mg (08/22/2018), 0.25 mg (08/26/2018), 0.25 mg (08/28/2018), 0.25 mg (08/30/2018), 0.25 mg (10/01/2018), 0.25 mg (10/02/2018), 0.25 mg (11/04/2018), 0.25 mg (09/23/2018), 0.25 mg (09/25/2018), 0.25 mg (09/27/2018), 0.25 mg (10/28/2018), 0.25 mg (10/30/2018), 0.25 mg (11/01/2018), 0.25 mg (12/02/2018), 0.25 mg (12/04/2018), 0.25 mg (12/06/2018) azaCITIDine (VIDAZA) 100 mg in sodium chloride 0.9 % 50 mL chemo infusion, 110 mg (66.7 % of original dose 75 mg/m2), Intravenous, Once, 4 of 6 cycles Dose modification: 50 mg/m2 (original dose 75 mg/m2, Cycle 1, Reason: Provider Judgment), 50 mg/m2 (original dose 75 mg/m2, Cycle 2, Reason: Provider Judgment) Administration: 100 mg (08/22/2018), 100 mg (08/23/2018), 100 mg (08/26/2018), 100 mg (08/27/2018), 100 mg (08/28/2018), 100 mg (08/29/2018), 100 mg (08/30/2018), 100 mg (10/01/2018), 100 mg (10/02/2018), 100 mg (11/04/2018), 100 mg (11/05/2018), 100 mg (09/23/2018), 100 mg (09/24/2018), 100 mg (09/25/2018), 100 mg (09/26/2018), 100 mg (09/27/2018), 100 mg (10/28/2018), 100 mg (10/29/2018), 100 mg (10/30/2018), 100 mg (10/31/2018), 100 mg (11/01/2018), 100 mg (12/02/2018), 100 mg (12/03/2018), 100 mg (12/04/2018), 100 mg (12/05/2018), 100 mg (12/06/2018)  for chemotherapy treatment.    01/20/2019 -  Chemotherapy   The patient had decitabine (DACOGEN) 45 mg in sodium chloride 0.9 %  50 mL chemo infusion, 20 mg/m2 = 45 mg, Intravenous,  Once, 3 of 4 cycles Dose modification: 15 mg/m2 (original dose 20 mg/m2, Cycle 2, Reason: Other (see comments), Comment: neutropenic fever) Administration: 45 mg (01/20/2019), 45 mg (01/21/2019), 45 mg (01/22/2019), 45 mg (01/23/2019), 45 mg (01/24/2019), 35 mg (03/17/2019), 35 mg (03/18/2019), 35 mg (03/19/2019), 35 mg (03/20/2019), 35 mg (03/21/2019), 35 mg (04/22/2019), 35 mg (04/23/2019), 35 mg (04/24/2019), 35 mg (04/25/2019) ondansetron (ZOFRAN) 8 mg in sodium chloride 0.9 % 50 mL IVPB, 8 mg (100 % of original dose 8 mg), Intravenous,  Once, 3 of 4 cycles Dose modification: 8 mg (original dose 8 mg, Cycle 1) Administration: 8 mg (01/20/2019), 8 mg (01/21/2019), 8 mg (01/22/2019), 8 mg (01/23/2019), 8 mg (01/24/2019), 8 mg (03/17/2019), 8 mg (03/18/2019), 8 mg (03/19/2019), 8 mg (03/20/2019), 8 mg (03/21/2019), 8 mg (04/22/2019), 8 mg (04/23/2019), 8 mg (04/24/2019), 8 mg (04/25/2019)  for chemotherapy treatment.    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[place])  01/07/2019 Initial Diagnosis   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[place])   01/20/2019 -  Chemotherapy   The patient had decitabine (DACOGEN) 45 mg in sodium chloride 0.9 % 50 mL chemo infusion, 20 mg/m2 = 45 mg, Intravenous,  Once, 3 of 4 cycles Dose modification: 15 mg/m2 (original dose 20 mg/m2, Cycle 2, Reason: Other (see comments), Comment: neutropenic fever) Administration: 45 mg (01/20/2019), 45 mg (01/21/2019), 45 mg (01/22/2019), 45 mg (01/23/2019), 45 mg (01/24/2019), 35 mg (03/17/2019), 35 mg (03/18/2019), 35 mg (03/19/2019), 35 mg (03/20/2019), 35 mg (03/21/2019), 35 mg (04/22/2019), 35 mg (04/23/2019), 35 mg (04/24/2019), 35 mg (04/25/2019) ondansetron (ZOFRAN) 8 mg in sodium chloride 0.9 % 50 mL IVPB, 8 mg (100 % of original  dose 8 mg), Intravenous,  Once, 3 of 4 cycles Dose modification: 8 mg (original dose 8 mg, Cycle 1) Administration: 8 mg (01/20/2019), 8 mg (01/21/2019), 8 mg (01/22/2019), 8 mg  (01/23/2019), 8 mg (01/24/2019), 8 mg (03/17/2019), 8 mg (03/18/2019), 8 mg (03/19/2019), 8 mg (03/20/2019), 8 mg (03/21/2019), 8 mg (04/22/2019), 8 mg (04/23/2019), 8 mg (04/24/2019), 8 mg (04/25/2019)  for chemotherapy treatment.       CANCER STAGING: Cancer Staging No matching staging information was found for the patient.   INTERVAL HISTORY:  Richard Wallace 74 y.o. male seen for AML and toxicity assessment.  Denies any fevers, night sweats or weight loss.  Denies any chills or rigors.  No recent infections or hospitalizations noted.  Knee pains are stable.  Denies any nausea, vomiting or diarrhea.  REVIEW OF SYSTEMS:  Review of Systems  Cardiovascular: Positive for leg swelling.  Musculoskeletal: Positive for arthralgias.  All other systems reviewed and are negative.    PAST MEDICAL/SURGICAL HISTORY:  Past Medical History:  Diagnosis Date  . Arthritis   . 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 Cancer ([place]) 01-28-2014   skin cancer  . CKD (chronic kidney disease), stage III   . GERD (gastroesophageal reflux disease)   . Heart murmur    NOTED DURING PHYSICAL WHEN HE WAS ENLISTING IN MIL[facility]ARY , DIDNT KNOW UNTIL THAT TIME AND REPORTS , "THATS THE LAST I HEARD ABOUT IT "   . History of hypertension    no longer issue  . History of kidney stones   . History of malignant melanoma of skin    excision top of scalp 2015-- no recurrence  . History of urinary retention    post op lumbar fusion surgery 04/ 2016  . Hypertension   . 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 Left ureteral calculus   . Seasonal allergies   . Wears glasses   . Wears glasses   . Wears partial dentures    upper and lower   Past Surgical History:  Procedure Laterality Date  . ANKLE FUSION Right 2007  . CARPAL TUNNEL RELEASE Left 12/28/2009   w/ pulley release left long finger  . CARPAL TUNNEL RELEASE Right 07/22/2013    Procedure: RIGHT CARPAL TUNNEL RELEASE;  Surgeon: Cammie Sickle., MD;  Location: [facility];  Service: Orthopedics;  Laterality: Right;  . COLONOSCOPY    . CYSTO/  LEFT RETROGRADE PYELOGRAM  11/21/2010  . CYSTOSCOPY WITH STENT PLACEMENT Left 03/09/2016   Procedure: CYSTOSCOPY WITH STENT PLACEMENT;  Surgeon: Franchot Gallo, MD;  Location: [facility];  Service: Urology;  Laterality: Left;  . CYSTOSCOPY/RETROGRADE/URETEROSCOPY/STONE EXTRACTION WITH BASKET Left 03/09/2016   Procedure: CYSTOSCOPY/RETROGRADE/URETEROSCOPY/STONE EXTRACTION WITH BASKET;  Surgeon: Franchot Gallo, MD;  Location: [facility];  Service: Urology;  Laterality: Left;  . LEFT URETEROSCOPIC LASER LITHOTRIPSY STONE EXTRACTION/ STENT PLACEMENT  05/23/2010  . MOHS SURGERY     TOP OF THE HEAD   . ORIF ANKLE FRACTURE Right 1978  . PORT-A-CATH REMOVAL Right 02/14/2019   Procedure: MINOR REMOVAL PORT-A-CATH;  Surgeon: Aviva Signs, MD;  Location: [facility];  Service: General;  Laterality: Right;  . PORTACATH PLACEMENT Right 08/19/2018   Procedure: INSERTION PORT-A-CATH (attached catheter in right subclavian);  Surgeon: Aviva Signs, MD;  Location: [facility];  Service: General;  Laterality: Right;  . PORTACATH PLACEMENT Left 05/16/2019   Procedure:  INSERTION PORT-A-CATH (attached catheter in left subclavian);  Surgeon: Aviva Signs, MD;  Location: [facility];  Service: General;  Laterality: Left;  . POSTERIOR LUMBAR FUSION  08/21/2014   laminectomy and decompression L2 -- L5  . RIGHT LOWER LEG SURGERY  X3  1975 to 1976   including ORIF  . TONSILLECTOMY AND ADENOIDECTOMY  1986  . UMBILICAL HERNIA REPAIR  2009 approx     SOCIAL HISTORY:  Social History   Socioeconomic History  . Marital status: Married    Spouse name: Not on file  . Number of children: Not on file  . Years of education: Not on file  . Highest education level: Not on file  Occupational History  . Not on file   Tobacco Use  . Smoking status: Former Smoker    Years: 20.00    Types: Cigarettes    Quit date: 07/17/1986    Years since quitting: 32.8  . Smokeless tobacco: Never Used  Substance and Sexual Activity  . Alcohol use: Yes    Alcohol/week: 7.0 - 14.0 standard drinks    Types: 7 - 14 Cans of beer per week    Comment: 1 -2 beer daily  . Drug use: No  . Sexual activity: Not Currently  Other Topics Concern  . Not on file  Social History Narrative  . Not on file   Social Determinants of Health   Financial Resource Strain: Unknown  . Difficulty of Paying Living Expenses: Patient refused  Food Insecurity: Unknown  . Worried About Charity fundraiser in the Last Year: Patient refused  . Ran Out of Food in the Last Year: Patient refused  Transportation Needs: Unknown  . Lack of Transportation (Medical): Patient refused  . Lack of Transportation (Non-Medical): Patient refused  Physical Activity: Unknown  . Days of Exercise per Week: Patient refused  . Minutes of Exercise per Session: Patient refused  Stress: Unknown  . Feeling of Stress : Patient refused  Social Connections: Unknown  . Frequency of Communication with Friends and Family: Patient refused  . Frequency of Social Gatherings with Friends and Family: Patient refused  . Attends Religious Services: Patient refused  . Active Member of Clubs or Organizations: Patient refused  . Attends Archivist Meetings: Patient refused  . Marital Status: Patient refused  Intimate Partner Violence: Unknown  . Fear of Current or Ex-Partner: Patient refused  . Emotionally Abused: Patient refused  . Physically Abused: Patient refused  . Sexually Abused: Patient refused    FAMILY HISTORY:  Family History  Problem Relation Age of Onset  . Stroke Mother   . Prostate cancer Father   . Bone cancer Father   . Diverticulitis Father   . Rheum arthritis Sister   . Urinary tract infection Sister   . Colon cancer Neg Hx     CURRENT  MEDICATIONS:  Outpatient Encounter Medications as of 05/15/2019  Medication Sig Note  . allopurinol (ZYLOPRIM) 300 MG tablet Take 300 mg by mouth at bedtime.    . cetirizine (ZYRTEC) 10 MG tablet Take 10 mg by mouth daily. IN THE MORNING   . docusate sodium (COLACE) 100 MG capsule Take 100 mg by mouth at bedtime.    . hydrochlorothiazide (HYDRODIURIL) 12.5 MG tablet Take 12.5 mg by mouth daily.    . lansoprazole (PREVACID) 15 MG capsule Take 15 mg by mouth daily.    Marland Kitchen lisinopril (ZESTRIL) 2.5 MG tablet Take 2.5 mg by mouth daily.   . tamsulosin (FLOMAX) 0.4  MG CAPS capsule Take 0.4 mg by mouth every evening.    . venetoclax 100 MG TABS Take 200 mg by mouth daily. 05/15/2019: On hold (patient is not currently receiving chemo treatment)  . [DISCONTINUED] magnesium oxide (MAG-OX) 400 MG tablet Take 400 mg by mouth at bedtime.    Marland Kitchen acetaminophen (TYLENOL) 500 MG tablet Take 1,000 mg by mouth 3 (three) times daily.    . diclofenac sodium (VOLTAREN) 1 % GEL Apply 1 g topically 3 (three) times daily as needed (knee pain.).    Marland Kitchen lidocaine-prilocaine (EMLA) cream Apply 1 application topically See admin instructions. ONE HOUR PRIOR TO CHEMOTHERAPY APPOINTMENT   . traMADol (ULTRAM) 50 MG tablet TAKE 1 TABLET BY MOUTH THREE TIMES DAILY AS NEEDED FOR PAIN (Patient taking differently: Take 50 mg by mouth 3 (three) times daily. )    No facility-administered encounter medications on file as of 05/15/2019.    ALLERGIES:  No Known Allergies   PHYSICAL EXAM:  ECOG Performance status: 1  Vitals:   05/15/19 1006  BP: 108/62  Pulse: 78  Resp: 18  Temp: (!) 97.4 F (36.3 C)  SpO2: 95%   Filed Weights   05/15/19 1006  Weight: 222 lb 9.6 oz (101 kg)    Physical Exam Constitutional:      Appearance: Normal appearance.  HENT:     Mouth/Throat:     Mouth: Mucous membranes are moist.     Pharynx: Oropharynx is clear.  Eyes:     Extraocular Movements: Extraocular movements intact.      Conjunctiva/sclera: Conjunctivae normal.  Cardiovascular:     Rate and Rhythm: Normal rate and regular rhythm.     Heart sounds: Normal heart sounds.  Pulmonary:     Effort: Pulmonary effort is normal.     Breath sounds: Normal breath sounds.  Abdominal:     General: There is no distension.     Palpations: Abdomen is soft. There is no mass.  Musculoskeletal:        General: No deformity. Normal range of motion.     Cervical back: Normal range of motion.  Skin:    General: Skin is warm.  Neurological:     General: No focal deficit present.     Mental Status: He is alert and oriented to person, place, and time.  Psychiatric:        Mood and Affect: Mood normal.        Behavior: Behavior normal.      LABORATORY DATA:  I have reviewed the labs as listed.  CBC    Component Value Date/Time   WBC 1.5 (L) 05/13/2019 1053   RBC 4.70 05/13/2019 1053   HGB 15.0 05/13/2019 1053   HCT 46.5 05/13/2019 1053   PLT 163 05/13/2019 1053   MCV 98.9 05/13/2019 1053   MCH 31.9 05/13/2019 1053   MCHC 32.3 05/13/2019 1053   RDW 15.8 (H) 05/13/2019 1053   LYMPHSABS 0.9 05/13/2019 1053   MONOABS 0.1 05/13/2019 1053   EOSABS 0.0 05/13/2019 1053   BASOSABS 0.0 05/13/2019 1053   CMP Latest Ref Rng & Units 05/13/2019 05/06/2019 04/28/2019  Glucose 70 - 99 mg/dL 104(H) 104(H) 118(H)  BUN 8 - 23 mg/dL 25(H) 24(H) 26(H)  Creatinine 0.61 - 1.24 mg/dL 1.11 0.99 1.08  Sodium 135 - 145 mmol/L 141 141 140  Potassium 3.5 - 5.1 mmol/L 4.0 4.4 4.4  Chloride 98 - 111 mmol/L 107 106 104  CO2 22 - 32 mmol/L 26 25 26  Calcium 8.9 - 10.3 mg/dL 9.4 9.1 9.5  Total Protein 6.5 - 8.1 g/dL 6.4(L) 6.1(L) 6.3(L)  Total Bilirubin 0.3 - 1.2 mg/dL 0.7 0.5 0.7  Alkaline Phos 38 - 126 U/L 58 61 67  AST 15 - 41 U/L _0 ALT 0 - 44 U/L 28 37 22        ASSESSMENT & PLAN:   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HCC) 1.  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  Flow  cytometry-12% blasts.  Chromosome analysis and FISH panel normal. -4 cycles of Vidaza from 08/22/2018 through 12/02/2018. -Repeat bone marrow biopsy on 12/23/2018 [facility] showed 25% blasts and 90% cellular marrow, consistent with AML with MDS related changes.  Flow cytometry showed 11% blasts.  MDS FISH panel was normal. -NGS panel did not show any targetable mutations. -Cycle 1 of decitabine (20 mg/m2) with venetoclax 300 mg daily on 01/20/2019. -He was hospitalized from 02/09/2019 through 02/18/2019 with neutropenic fever.  His venetoclax was held at that time. -His blood cultures grew Streptococcus Infantarius.  His Port-A-Cath was removed on 02/14/2019.  He also had lung infiltrates with cough which resolved.  He had a PICC line placed in the left arm. -Bone marrow biopsy on 03/05/2019 did not show any evidence of leukemia. -Cycle 2 of decitabine (15 mg/M 2) on 03/17/2019.  Venetoclax was dose reduced to 200 mg, took for 2 weeks until 03/31/2019. -Cycle 3 chemotherapy on 04/22/2019 for 4 days of decitabine 15 mg/m2. -Venetoclax was discontinued after 2 weeks.  He denies any fevers or infections. -We reviewed labs.  ANC is 500.  White count is 1.5. -I will ask him to come back on 05/26/2019 as his counts are unlikely to recover next Monday. -He will have a port placed prior to next visit.   2.  CKD: -Creatinine is 1.18 today.  3.  Bilateral knee pains: -He will continue tramadol 3 times a day as needed along with Tylenol.  4.  Hypomagnesemia: -He will continue magnesium once daily.    Orders placed this encounter:  Orders Placed This Encounter  Procedures  . CBC with Differential/Platelet  . Comprehensive metabolic panel  . Magnesium  . Phosphorus  . Uric acid      [name], MD [place] [phone]

## 2019-05-15 NOTE — Patient Instructions (Addendum)
[facility] at [facility] Discharge Instructions  You were seen today by Dr. Katragadda. He went over your recent lab results. He will see you back in 2 weeks for labs, treatment and follow up.   Thank you for choosing [facility] at [facility] to provide your oncology and hematology care.  To afford each patient quality time with our provider, please arrive at least 15 minutes before your scheduled appointment time.   If you have a lab appointment with the Cancer Center please come in thru the  Main Entrance and check in at the main information desk  You need to re-schedule your appointment should you arrive 10 or more minutes late.  We strive to give you quality time with our providers, and arriving late affects you and other patients whose appointments are after yours.  Also, if you no show three or more times for appointments you may be dismissed from the clinic at the providers discretion.     Again, thank you for choosing [facility].  Our hope is that these requests will decrease the amount of time that you wait before being seen by our physicians.       _____________________________________________________________  Should you have questions after your visit to [facility], please contact our office at [phone] between the hours of 8:00 a.m. and 4:30 p.m.  Voicemails left after 4:00 p.m. will not be returned until the following business day.  For prescription refill requests, have your pharmacy contact our office and allow 72 hours.    Cancer Center Support Programs:   > Cancer Support Group  2nd Tuesday of the month 1pm-2pm, Journey Room    

## 2019-05-16 ENCOUNTER — Ambulatory Visit (HOSPITAL_COMMUNITY): Payer: Medicare Other | Admitting: Anesthesiology

## 2019-05-16 ENCOUNTER — Encounter (HOSPITAL_COMMUNITY): Payer: Self-pay | Admitting: General Surgery

## 2019-05-16 ENCOUNTER — Ambulatory Visit (HOSPITAL_COMMUNITY)
Admission: RE | Admit: 2019-05-16 | Discharge: 2019-05-16 | Disposition: A | Discharge status: Home / Self Care | Payer: Medicare Other | Attending: General Surgery | Admitting: General Surgery

## 2019-05-16 ENCOUNTER — Ambulatory Visit (HOSPITAL_COMMUNITY): Payer: Medicare Other

## 2019-05-16 ENCOUNTER — Encounter (HOSPITAL_COMMUNITY)
Admission: RE | Disposition: A | Discharge status: Home / Self Care | Payer: Self-pay | Source: Home / Self Care | Attending: General Surgery

## 2019-05-16 DIAGNOSIS — C9200 Acute myeloblastic leukemia, not having achieved remission: Secondary | ICD-10-CM | POA: Diagnosis present

## 2019-05-16 DIAGNOSIS — K219 Gastro-esophageal reflux disease without esophagitis: Secondary | ICD-10-CM | POA: Diagnosis not present

## 2019-05-16 DIAGNOSIS — Z79899 Other long term (current) drug therapy: Secondary | ICD-10-CM | POA: Insufficient documentation

## 2019-05-16 DIAGNOSIS — I129 Hypertensive chronic kidney disease with stage 1 through stage 4 chronic kidney disease, or unspecified chronic kidney disease: Secondary | ICD-10-CM | POA: Insufficient documentation

## 2019-05-16 DIAGNOSIS — N183 Chronic kidney disease, stage 3 unspecified: Secondary | ICD-10-CM | POA: Diagnosis not present

## 2019-05-16 DIAGNOSIS — Z95828 Presence of other vascular implants and grafts: Secondary | ICD-10-CM

## 2019-05-16 HISTORY — PX: PORTACATH PLACEMENT: SHX2246

## 2019-05-16 SURGERY — INSERTION, TUNNELED CENTRAL VENOUS DEVICE, WITH PORT
Anesthesia: Monitor Anesthesia Care | Site: Chest | Laterality: Left

## 2019-05-16 MED ORDER — SODIUM CHLORIDE (PF) 0.9 % IJ SOLN
INTRAMUSCULAR | Status: DC | PRN
  Administered 2019-05-16: 500 mL

## 2019-05-16 MED ORDER — CHLORHEXIDINE GLUCONATE CLOTH 2 % EX PADS: 6.0000 | MEDICATED_PAD | Freq: Once | CUTANEOUS | Status: DC

## 2019-05-16 MED ORDER — LACTATED RINGERS IV SOLN: INTRAVENOUS | Status: DC

## 2019-05-16 MED ORDER — LIDOCAINE 2% (20 MG/ML) 5 ML SYRINGE
INTRAMUSCULAR | Status: AC
  Filled 2019-05-16: qty 5

## 2019-05-16 MED ORDER — HEPARIN SOD (PORK) LOCK FLUSH 100 UNIT/ML IV SOLN
INTRAVENOUS | Status: DC | PRN
  Administered 2019-05-16: 500 [IU] via INTRAVENOUS

## 2019-05-16 MED ORDER — PROPOFOL 10 MG/ML IV BOLUS
INTRAVENOUS | Status: AC
  Filled 2019-05-16: qty 20

## 2019-05-16 MED ORDER — HYDROMORPHONE HCL 1 MG/ML IJ SOLN: 0.2500 mg | INTRAMUSCULAR | Status: DC | PRN

## 2019-05-16 MED ORDER — KETAMINE HCL 50 MG/5ML IJ SOSY
PREFILLED_SYRINGE | INTRAMUSCULAR | Status: AC
  Filled 2019-05-16: qty 5

## 2019-05-16 MED ORDER — LIDOCAINE HCL (PF) 1 % IJ SOLN
INTRAMUSCULAR | Status: DC | PRN
  Administered 2019-05-16: 5 mL
  Administered 2019-05-16: 8 mL

## 2019-05-16 MED ORDER — KETOROLAC TROMETHAMINE 30 MG/ML IJ SOLN
15.0000 mg | Freq: Once | INTRAMUSCULAR | Status: AC
  Administered 2019-05-16: 15 mg via INTRAVENOUS
  Filled 2019-05-16: qty 1

## 2019-05-16 MED ORDER — KETAMINE HCL 10 MG/ML IJ SOLN
INTRAMUSCULAR | Status: DC | PRN
  Administered 2019-05-16 (×2): 10 mg via INTRAVENOUS

## 2019-05-16 MED ORDER — HYDROCODONE-ACETAMINOPHEN 7.5-325 MG PO TABS: 1.0000 | ORAL_TABLET | Freq: Once | ORAL | Status: DC | PRN

## 2019-05-16 MED ORDER — PROMETHAZINE HCL 25 MG/ML IJ SOLN: 6.2500 mg | INTRAMUSCULAR | Status: DC | PRN

## 2019-05-16 MED ORDER — HEPARIN SOD (PORK) LOCK FLUSH 100 UNIT/ML IV SOLN
INTRAVENOUS | Status: AC
  Filled 2019-05-16: qty 5

## 2019-05-16 MED ORDER — VANCOMYCIN HCL IN DEXTROSE 1-5 GM/200ML-% IV SOLN
1000.0000 mg | INTRAVENOUS | Status: AC
  Administered 2019-05-16: 1000 mg via INTRAVENOUS
  Filled 2019-05-16: qty 200

## 2019-05-16 MED ORDER — PROPOFOL 10 MG/ML IV BOLUS
INTRAVENOUS | Status: DC | PRN
  Administered 2019-05-16: 10 mg via INTRAVENOUS

## 2019-05-16 MED ORDER — PROPOFOL 500 MG/50ML IV EMUL
INTRAVENOUS | Status: DC | PRN
  Administered 2019-05-16: 75 ug/kg/min via INTRAVENOUS

## 2019-05-16 MED ORDER — MIDAZOLAM HCL 2 MG/2ML IJ SOLN: 0.5000 mg | Freq: Once | INTRAMUSCULAR | Status: DC | PRN

## 2019-05-16 MED ORDER — ONDANSETRON HCL 4 MG/2ML IJ SOLN
INTRAMUSCULAR | Status: AC
  Filled 2019-05-16: qty 2

## 2019-05-16 MED ORDER — LIDOCAINE HCL (CARDIAC) PF 100 MG/5ML IV SOSY
PREFILLED_SYRINGE | INTRAVENOUS | Status: DC | PRN
  Administered 2019-05-16: 40 mg via INTRAVENOUS

## 2019-05-16 MED ORDER — LIDOCAINE HCL (PF) 1 % IJ SOLN
INTRAMUSCULAR | Status: AC
  Filled 2019-05-16: qty 30

## 2019-05-16 MED ORDER — LACTATED RINGERS IV SOLN: INTRAVENOUS | Status: DC | PRN

## 2019-05-16 SURGICAL SUPPLY — 28 items
APPLICATOR CHLORAPREP 10.5 ORG (MISCELLANEOUS) ×2 IMPLANT
BAG DECANTER FOR FLEXI CONT (MISCELLANEOUS) ×2 IMPLANT
CLOTH BEACON ORANGE TIMEOUT ST (SAFETY) ×2 IMPLANT
COVER LIGHT HANDLE STERIS (MISCELLANEOUS) ×4 IMPLANT
COVER WAND RF STERILE (DRAPES) ×2 IMPLANT
DECANTER SPIKE VIAL GLASS SM (MISCELLANEOUS) ×2 IMPLANT
DERMABOND ADVANCED (GAUZE/BANDAGES/DRESSINGS) ×1
DERMABOND ADVANCED .7 DNX12 (GAUZE/BANDAGES/DRESSINGS) ×1 IMPLANT
DRAPE C-ARM FOLDED MOBILE STRL (DRAPES) ×2 IMPLANT
ELECT REM PT RETURN 9FT ADLT (ELECTROSURGICAL) ×2
ELECTRODE REM PT RTRN 9FT ADLT (ELECTROSURGICAL) ×1 IMPLANT
GLOVE BIOGEL M 7.0 STRL (GLOVE) ×2 IMPLANT
GLOVE BIOGEL PI IND STRL 7.0 (GLOVE) ×2 IMPLANT
GLOVE BIOGEL PI INDICATOR 7.0 (GLOVE) ×2
GLOVE SURG SS PI 7.5 STRL IVOR (GLOVE) ×2 IMPLANT
GOWN STRL REUS W/TWL LRG LVL3 (GOWN DISPOSABLE) ×4 IMPLANT
IV NS 500ML (IV SOLUTION) ×1
IV NS 500ML BAXH (IV SOLUTION) ×1 IMPLANT
KIT PORT POWER 8FR ISP MRI (Port) ×2 IMPLANT
KIT TURNOVER KIT A (KITS) ×2 IMPLANT
NEEDLE HYPO 25X1 1.5 SAFETY (NEEDLE) ×2 IMPLANT
PACK MINOR (CUSTOM PROCEDURE TRAY) ×2 IMPLANT
PAD ARMBOARD 7.5X6 YLW CONV (MISCELLANEOUS) ×2 IMPLANT
SET BASIN LINEN APH (SET/KITS/TRAYS/PACK) ×2 IMPLANT
SUT MNCRL AB 4-0 PS2 18 (SUTURE) ×2 IMPLANT
SUT VIC AB 3-0 SH 27 (SUTURE) ×1
SUT VIC AB 3-0 SH 27X BRD (SUTURE) ×1 IMPLANT
SYR CONTROL 10ML LL (SYRINGE) ×2 IMPLANT

## 2019-05-16 NOTE — Addendum Note (Signed)
Addendum  created 05/16/19 1121 by Lenice Llamas, MD   SmartForm saved

## 2019-05-16 NOTE — Anesthesia Preprocedure Evaluation (Addendum)
Anesthesia Evaluation  Patient identified by MRN, date of birth, ID band Patient awake    Reviewed: Allergy & Precautions, NPO status , Patient's Chart, lab work & pertinent test results  Airway Mallampati: II  TM Distance: >3 FB Neck ROM: Full    Dental no notable dental hx. (+) Partial Upper, Partial Lower   Pulmonary neg pulmonary ROS, former smoker,    Pulmonary exam normal breath sounds clear to auscultation       Cardiovascular Exercise Tolerance: Poor hypertension, Pt. on medications negative cardio ROS Normal cardiovascular examI Rhythm:Regular Rate:Normal  States ET limited by Bilat Bad knees and a bad ankle     Neuro/Psych negative neurological ROS  negative psych ROS   GI/Hepatic Neg liver ROS, GERD  Medicated and Controlled,  Endo/Other  negative endocrine ROS  Renal/GU negative Renal ROSReports one functioning kidney - the other with very diminished fx after large stone  Last CrCl 69  negative genitourinary   Musculoskeletal  (+) Arthritis , Osteoarthritis,    Abdominal   Peds negative pediatric ROS (+)  Hematology negative hematology ROS (+)   Anesthesia Other Findings Here for port a cath - old one d/c'd after infection  Now with Leukemia  CBC ok   Reproductive/Obstetrics negative OB ROS                             Anesthesia Physical Anesthesia Plan  ASA: III  Anesthesia Plan: MAC   Post-op Pain Management:    Induction: Intravenous  PONV Risk Score and Plan: 2 and TIVA, Propofol infusion, Treatment may vary due to age or medical condition and Ondansetron  Airway Management Planned: Simple Face Mask and Nasal Cannula  Additional Equipment:   Intra-op Plan:   Post-operative Plan:   Informed Consent: I have reviewed the patients History and Physical, chart, labs and discussed the procedure including the risks, benefits and alternatives for the proposed  anesthesia with the patient or authorized representative who has indicated his/her understanding and acceptance.     Dental advisory given  Plan Discussed with: CRNA  Anesthesia Plan Comments: (Plan Full PPE use  Plan MAC as tolerated with GA(LMA) vs. GETA as needed d/w pt -WTP with same after Q&A)       Anesthesia Quick Evaluation

## 2019-05-16 NOTE — Anesthesia Postprocedure Evaluation (Signed)
Anesthesia Post Note Late Entry for 1039  Patient: Richard Wallace  Procedure(s) Performed: INSERTION PORT-A-CATH (attached catheter in left subclavian) (Left Chest)  Patient location during evaluation: PACU Anesthesia Type: General Level of consciousness: awake and alert and patient cooperative Pain management: pain level controlled Vital Signs Assessment: post-procedure vital signs reviewed and stable Respiratory status: spontaneous breathing Cardiovascular status: stable Postop Assessment: no apparent nausea or vomiting Anesthetic complications: no     Last Vitals:  Vitals:   05/16/19 1016 05/16/19 1030  BP: 128/75 132/80  Pulse: 74 72  Resp: 17 15  Temp: 37.1 C   SpO2: 98% 97%    Last Pain:  Vitals:   05/16/19 1030  TempSrc:   PainSc: 0-No pain                 Jermel Artley A

## 2019-05-16 NOTE — Discharge Instructions (Signed)

## 2019-05-16 NOTE — Transfer of Care (Signed)
Immediate Anesthesia Transfer of Care Note  Patient: Richard Wallace  Procedure(s) Performed: INSERTION PORT-A-CATH (attached catheter in left subclavian) (Left Chest)  Patient Location: PACU  Anesthesia Type:MAC  Level of Consciousness: awake, alert , oriented and patient cooperative  Airway & Oxygen Therapy: Patient Spontanous Breathing  Post-op Assessment: Report given to RN and Post -op Vital signs reviewed and stable  Post vital signs: Reviewed and stable  Last Vitals:  Vitals Value Taken Time  BP 128/75 05/16/19 1017  Temp    Pulse 74 05/16/19 1018  Resp 18 05/16/19 1018  SpO2 99 % 05/16/19 1018  Vitals shown include unvalidated device data.  Last Pain:  Vitals:   05/16/19 0845  TempSrc: Oral  PainSc: 0-No pain      Patients Stated Pain Goal: 8 (25/85/27 7824)  Complications: No apparent anesthesia complications

## 2019-05-16 NOTE — Op Note (Signed)
Patient:  Owens Hara  DOB:  07/17/1945  MRN:  [id number]   Preop Diagnosis: Acute myelogenous leukemia  Postop Diagnosis: Same  Procedure: Port-A-Cath insertion  Surgeon: Aviva Signs, MD  Anes: MAC  Indications: Patient is a 74 year old white male undergoing chemotherapy for acute myelogenous leukemia who needs central venous access.  The risks and benefits of the procedure including bleeding, infection, and pneumothorax were fully explained to the patient, who gave informed consent.  Procedure note: The patient was placed in the Trendelenburg position after the left upper chest was prepped and draped using usual sterile technique with ChloraPrep.  Surgical site confirmation was performed.  1% Xylocaine was used for local anesthesia.  Incision was made below the left clavicle.  A subcutaneous pocket was formed.  A needle was advanced into the left subclavian vein using the Seldinger technique without difficulty.  A guidewire was then advanced into the right atrium under fluoroscopic guidance.  An introducer peel-away sheath were placed over the guidewire.  The guidewire was removed.  The catheter was inserted through the peel-away sheath and the peel-away sheath was removed.  The catheter was then attached to the port and the port placed in its subcutaneous pocket.  Adequate positioning was confirmed by fluoroscopy.  Good backflow of venous blood was noted on aspiration of the port.  The port was flushed with heparin flush.  Subcutaneous layer was reapproximated using a 3-0 Vicryl interrupted suture.  The skin was closed using a 4-0 Monocryl subcuticular suture.  Dermabond was applied.  All tape and needle counts were correct at the end of the procedure.  Patient was awakened and transferred to PACU in stable condition.  Complications: None  EBL: Minimal  Specimen: None

## 2019-05-16 NOTE — Interval H&P Note (Signed)
History and Physical Interval Note:  05/16/2019 9:09 AM  Richard Wallace  has presented today for surgery, with the diagnosis of LEUKEMIA.  The various methods of treatment have been discussed with the patient and family. After consideration of risks, benefits and other options for treatment, the patient has consented to  Procedure(s): INSERTION PORT-A-CATH (Left) as a surgical intervention.  The patient's history has been reviewed, patient examined, no change in status, stable for surgery.  I have reviewed the patient's chart and labs.  Questions were answered to the patient's satisfaction.     Aviva Signs

## 2019-05-19 ENCOUNTER — Ambulatory Visit (HOSPITAL_COMMUNITY): Payer: Medicare Other

## 2019-05-19 ENCOUNTER — Other Ambulatory Visit (HOSPITAL_COMMUNITY): Payer: Medicare Other

## 2019-05-19 ENCOUNTER — Ambulatory Visit (HOSPITAL_COMMUNITY): Payer: Medicare Other | Admitting: Hematology

## 2019-05-20 ENCOUNTER — Ambulatory Visit (HOSPITAL_COMMUNITY): Payer: Medicare Other

## 2019-05-21 ENCOUNTER — Ambulatory Visit (HOSPITAL_COMMUNITY): Payer: Medicare Other

## 2019-05-22 ENCOUNTER — Ambulatory Visit (HOSPITAL_COMMUNITY): Payer: Medicare Other

## 2019-05-23 ENCOUNTER — Ambulatory Visit (HOSPITAL_COMMUNITY): Payer: Medicare Other

## 2019-05-26 ENCOUNTER — Encounter (HOSPITAL_COMMUNITY): Payer: Self-pay | Admitting: Hematology

## 2019-05-26 ENCOUNTER — Inpatient Hospital Stay (HOSPITAL_COMMUNITY): Payer: Medicare Other

## 2019-05-26 ENCOUNTER — Other Ambulatory Visit: Payer: Self-pay

## 2019-05-26 ENCOUNTER — Inpatient Hospital Stay (HOSPITAL_BASED_OUTPATIENT_CLINIC_OR_DEPARTMENT_OTHER): Payer: Medicare Other | Admitting: Hematology

## 2019-05-26 VITALS — BP 132/72 | HR 76

## 2019-05-26 DIAGNOSIS — C9200 Acute myeloblastic leukemia, not having achieved remission: Secondary | ICD-10-CM

## 2019-05-26 DIAGNOSIS — Z95828 Presence of other vascular implants and grafts: Secondary | ICD-10-CM

## 2019-05-26 DIAGNOSIS — D61818 Other pancytopenia: Secondary | ICD-10-CM

## 2019-05-26 DIAGNOSIS — D46Z Other myelodysplastic syndromes: Secondary | ICD-10-CM

## 2019-05-26 DIAGNOSIS — Z5111 Encounter for antineoplastic chemotherapy: Secondary | ICD-10-CM | POA: Diagnosis not present

## 2019-05-26 LAB — COMPREHENSIVE METABOLIC PANEL
ALT: 36 U/L (ref 0–44)
AST: 26 U/L (ref 15–41)
Albumin: 3.9 g/dL (ref 3.5–5.0)
Alkaline Phosphatase: 63 U/L (ref 38–126)
Anion gap: 8 (ref 5–15)
BUN: 35 mg/dL — ABNORMAL HIGH (ref 8–23)
CO2: 24 mmol/L (ref 22–32)
Calcium: 9.5 mg/dL (ref 8.9–10.3)
Chloride: 106 mmol/L (ref 98–111)
Creatinine, Ser: 1.26 mg/dL — ABNORMAL HIGH (ref 0.61–1.24)
GFR calc Af Amer: >60 mL/min (ref >60)
GFR calc non Af Amer: 56 mL/min — ABNORMAL LOW (ref >60)
Glucose, Bld: 110 mg/dL — ABNORMAL HIGH (ref 70–99)
Potassium: 4.2 mmol/L (ref 3.5–5.1)
Sodium: 138 mmol/L (ref 135–145)
Total Bilirubin: 0.6 mg/dL (ref 0.3–1.2)
Total Protein: 6.5 g/dL (ref 6.5–8.1)

## 2019-05-26 LAB — CBC WITH DIFFERENTIAL/PLATELET
Abs Immature Granulocytes: 0.02 10*3/uL (ref 0.00–0.07)
Basophils Absolute: 0 10*3/uL (ref 0.0–0.1)
Basophils Relative: 1 %
Eosinophils Absolute: 0.1 10*3/uL (ref 0.0–0.5)
Eosinophils Relative: 2 %
HCT: 46.2 % (ref 39.0–52.0)
Hemoglobin: 15.1 g/dL (ref 13.0–17.0)
Immature Granulocytes: 1 %
Lymphocytes Relative: 49 %
Lymphs Abs: 1.3 10*3/uL (ref 0.7–4.0)
MCH: 31.9 pg (ref 26.0–34.0)
MCHC: 32.7 g/dL (ref 30.0–36.0)
MCV: 97.5 fL (ref 80.0–100.0)
Monocytes Absolute: 0.5 10*3/uL (ref 0.1–1.0)
Monocytes Relative: 18 %
Neutro Abs: 0.8 10*3/uL — ABNORMAL LOW (ref 1.7–7.7)
Neutrophils Relative %: 29 %
Platelets: 158 10*3/uL (ref 150–400)
RBC: 4.74 MIL/uL (ref 4.22–5.81)
RDW: 15.6 % — ABNORMAL HIGH (ref 11.5–15.5)
WBC: 2.7 10*3/uL — ABNORMAL LOW (ref 4.0–10.5)
nRBC: 0 % (ref 0.0–0.2)

## 2019-05-26 MED ORDER — SODIUM CHLORIDE 0.9 % IV SOLN
8.0000 mg | Freq: Once | INTRAVENOUS | Status: AC
  Administered 2019-05-26: 8 mg via INTRAVENOUS
  Filled 2019-05-26: qty 4

## 2019-05-26 MED ORDER — HEPARIN SOD (PORK) LOCK FLUSH 100 UNIT/ML IV SOLN: 500.0000 [IU] | Freq: Once | INTRAVENOUS | Status: DC | PRN

## 2019-05-26 MED ORDER — OCTREOTIDE ACETATE 20 MG IM KIT
PACK | INTRAMUSCULAR | Status: AC
  Filled 2019-05-26: qty 1

## 2019-05-26 MED ORDER — SODIUM CHLORIDE 0.9 % IV SOLN: Freq: Once | INTRAVENOUS | Status: AC

## 2019-05-26 MED ORDER — SODIUM CHLORIDE 0.9% FLUSH
10.0000 mL | INTRAVENOUS | Status: DC | PRN
  Administered 2019-05-26: 10 mL

## 2019-05-26 MED ORDER — SODIUM CHLORIDE 0.9 % IV SOLN
15.0000 mg/m2 | Freq: Once | INTRAVENOUS | Status: AC
  Administered 2019-05-26: 35 mg via INTRAVENOUS
  Filled 2019-05-26: qty 7

## 2019-05-26 NOTE — Assessment & Plan Note (Addendum)
1.  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  Flow cytometry-12% blasts.  Chromosome analysis and FISH panel normal. -4 cycles of Vidaza from 08/22/2018 through 12/02/2018. -Repeat bone marrow biopsy on 12/23/2018 at [facility] showed 25% blasts and 90% cellular marrow, consistent with AML with MDS related changes.  Flow cytometry showed 11% blasts.  MDS FISH panel was normal. -NGS panel did not show any targetable mutations. -Cycle 1 of decitabine (20 mg/m2) with venetoclax 300 mg daily on 01/20/2019. -He was hospitalized from 02/09/2019 through 02/18/2019 with neutropenic fever.  His venetoclax was held at that time. -His blood cultures grew Streptococcus Infantarius.  His Port-A-Cath was removed on 02/14/2019.  He also had lung infiltrates with cough which resolved.  He had a PICC line placed in the left arm. -Bone marrow biopsy on 03/05/2019 did not show any evidence of leukemia. -Cycle 2 of decitabine (15 mg/M 2) on 03/17/2019.  Venetoclax was dose reduced to 200 mg, took for 2 weeks until 03/31/2019. -Cycle 3 chemotherapy on 04/22/2019 for 4 days of decitabine 15 mg/m2. -Venetoclax was discontinued after 2 weeks.  He denies any fevers or infections. -We reviewed labs.  ANC is 500.  White count is 1.5. -I will ask him to come back on 05/26/2019 as his counts are unlikely to recover next Monday. -He will have a port placed prior to next visit.   2.  CKD: -Creatinine is 1.18 today.  3.  Bilateral knee pains: -He will continue tramadol 3 times a day as needed along with Tylenol.  4.  Hypomagnesemia: -He will continue magnesium once daily.

## 2019-05-26 NOTE — Assessment & Plan Note (Signed)
1.  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  Flow cytometry-12% blasts.  Chromosome analysis and FISH panel normal. -4 cycles of Vidaza from 08/22/2018 through 12/02/2018. -Repeat bone marrow biopsy on 12/23/2018 at [facility] showed 25% blasts and 90% cellular marrow, consistent with AML with MDS related changes.  Flow cytometry showed 11% blasts.  MDS FISH panel was normal. -NGS panel did not show any targetable mutations. -Cycle 1 of decitabine (20 mg/m2) with venetoclax 300 mg daily on 01/20/2019. -He was hospitalized from 02/09/2019 through 02/18/2019 with neutropenic fever.  His venetoclax was held at that time. -His blood cultures grew Streptococcus Infantarius.  His Port-A-Cath was removed on 02/14/2019.  He also had lung infiltrates with cough which resolved.  He had a PICC line placed in the left arm. -Bone marrow biopsy on 03/05/2019 did not show any evidence of leukemia. -Cycle 2 of decitabine (15 mg/M 2) on 03/17/2019.  Venetoclax was dose reduced to 200 mg, took for 2 weeks until 03/31/2019. -Cycle 3 chemotherapy on 04/22/2019 for 4 days of decitabine 15 mg/m2. -He has port placed last week.  He is feeling fine.  We reviewed his labs.  ANC is 800. -He will proceed with cycle 4 of decitabine at 15 mg per metered squared. -We will see him back in 1 week for follow-up and blood counts. -He was told to start on venetoclax 200 mg daily.   2.  CKD: -Creatinine is 1.26 today.  3.  Bilateral knee pains: -He will continue tramadol 3 times a day as needed along with Tylenol.  4.  Hypomagnesemia: -He will continue magnesium once daily.

## 2019-05-26 NOTE — Patient Instructions (Signed)
[facility] Discharge Instructions for Patients Receiving Chemotherapy   Beginning January 23rd 2017 lab work for the Cancer Center will be done in the  Main lab at [facility] on 1st floor. If you have a lab appointment with the Cancer Center please come in thru the  Main Entrance and check in at the main information desk   Today you received the following chemotherapy agents Decitabine  To help prevent nausea and vomiting after your treatment, we encourage you to take your nausea medication    If you develop nausea and vomiting, or diarrhea that is not controlled by your medication, call the clinic.  The clinic phone number is [phone]. Office hours are Monday-Friday 8:30am-5:00pm.  BELOW ARE SYMPTOMS THAT SHOULD BE REPORTED IMMEDIATELY:  *FEVER GREATER THAN 101.0 F  *CHILLS WITH OR WITHOUT FEVER  NAUSEA AND VOMITING THAT IS NOT CONTROLLED WITH YOUR NAUSEA MEDICATION  *UNUSUAL SHORTNESS OF BREATH  *UNUSUAL BRUISING OR BLEEDING  TENDERNESS IN MOUTH AND THROAT WITH OR WITHOUT PRESENCE OF ULCERS  *URINARY PROBLEMS  *BOWEL PROBLEMS  UNUSUAL RASH Items with * indicate a potential emergency and should be followed up as soon as possible. If you have an emergency after office hours please contact your primary care physician or go to the nearest emergency department.  Please call the clinic during office hours if you have any questions or concerns.   You may also contact the Patient Navigator at [phone] should you have any questions or need assistance in obtaining follow up care.      Resources For Cancer Patients and their Caregivers ? American Cancer Society: Can assist with transportation, wigs, general needs, runs Look Good Feel Better.        1-888-227-6333 ? Cancer Care: Provides financial assistance, online support groups, medication/co-pay assistance.  1-800-813-HOPE (4673) ? [facility] Assists [place]  cancer patients and their families through emotional , educational and financial support.  336-427-4357 ? [place] DSS Where to apply for food stamps, Medicaid and utility assistance. 336-342-1394 ? [facility]: Transportation to medical appointments. 336-347-2287 ? Social Security Administration: May apply for disability if have a Stage IV cancer. 336-342-7796 1-800-772-1213 ? [place] Aging, Disability and Transit Services: Assists with nutrition, care and transit needs. 336-349-2343          

## 2019-05-26 NOTE — Progress Notes (Signed)
Patient has been assessed, vital signs and labs have been reviewed by Dr. Katragadda. ANC, Creatinine, LFTs, and Platelets are within treatment parameters per Dr. Katragadda. The patient is good to proceed with treatment at this time.  

## 2019-05-26 NOTE — Progress Notes (Signed)
Pt seen by Dr. Delton Coombes and lab results reviewed, including ANC 0.8. Per MD, ok to proceed with treatment this week.  Richard Wallace tolerated Decitabine without incident or complaint. VSS upon completion of treatment. Port flushed and left accessed for use tomorrow. Discharged in satisfactory condition with follow up instructions.

## 2019-05-26 NOTE — Patient Instructions (Signed)
[facility] at [facility] Discharge Instructions  Labs drawn from portacath today   Thank you for choosing [facility] at [facility] to provide your oncology and hematology care.  To afford each patient quality time with our provider, please arrive at least 15 minutes before your scheduled appointment time.   If you have a lab appointment with the Cancer Center please come in thru the Main Entrance and check in at the main information desk.  You need to re-schedule your appointment should you arrive 10 or more minutes late.  We strive to give you quality time with our providers, and arriving late affects you and other patients whose appointments are after yours.  Also, if you no show three or more times for appointments you may be dismissed from the clinic at the providers discretion.     Again, thank you for choosing [facility].  Our hope is that these requests will decrease the amount of time that you wait before being seen by our physicians.       _____________________________________________________________  Should you have questions after your visit to [facility], please contact our office at [phone] between the hours of 8:00 a.m. and 4:30 p.m.  Voicemails left after 4:00 p.m. will not be returned until the following business day.  For prescription refill requests, have your pharmacy contact our office and allow 72 hours.    Due to Covid, you will need to wear a mask upon entering the hospital. If you do not have a mask, a mask will be given to you at the Main Entrance upon arrival. For doctor visits, patients may have 1 support person with them. For treatment visits, patients can not have anyone with them due to social distancing guidelines and our immunocompromised population.     

## 2019-05-26 NOTE — Progress Notes (Signed)
05/26/19  OK to treat with ANC 800  T.O. Dr Rhys Martini, PharmD

## 2019-05-26 NOTE — Progress Notes (Signed)
[facility] [address], [place] [address]   CLINIC:  Medical Oncology/Hematology  PCP:  [name], MD [place] [address] [phone]   REASON FOR VISIT:  Follow-up for newly diagnosed AML.  CURRENT THERAPY: Decitabine and venetoclax  BRIEF ONCOLOGIC HISTORY:  Oncology History  MDS (myelodysplastic syndrome), high grade (HCC)  08/14/2018 Initial Diagnosis   MDS (myelodysplastic syndrome), high grade ([place])   08/22/2018 - 12/31/2018 Chemotherapy   The patient had palonosetron (ALOXI) injection 0.25 mg, 0.25 mg, Intravenous,  Once, 4 of 6 cycles Administration: 0.25 mg (08/22/2018), 0.25 mg (08/26/2018), 0.25 mg (08/28/2018), 0.25 mg (08/30/2018), 0.25 mg (10/01/2018), 0.25 mg (10/02/2018), 0.25 mg (11/04/2018), 0.25 mg (09/23/2018), 0.25 mg (09/25/2018), 0.25 mg (09/27/2018), 0.25 mg (10/28/2018), 0.25 mg (10/30/2018), 0.25 mg (11/01/2018), 0.25 mg (12/02/2018), 0.25 mg (12/04/2018), 0.25 mg (12/06/2018) azaCITIDine (VIDAZA) 100 mg in sodium chloride 0.9 % 50 mL chemo infusion, 110 mg (66.7 % of original dose 75 mg/m2), Intravenous, Once, 4 of 6 cycles Dose modification: 50 mg/m2 (original dose 75 mg/m2, Cycle 1, Reason: Provider Judgment), 50 mg/m2 (original dose 75 mg/m2, Cycle 2, Reason: Provider Judgment) Administration: 100 mg (08/22/2018), 100 mg (08/23/2018), 100 mg (08/26/2018), 100 mg (08/27/2018), 100 mg (08/28/2018), 100 mg (08/29/2018), 100 mg (08/30/2018), 100 mg (10/01/2018), 100 mg (10/02/2018), 100 mg (11/04/2018), 100 mg (11/05/2018), 100 mg (09/23/2018), 100 mg (09/24/2018), 100 mg (09/25/2018), 100 mg (09/26/2018), 100 mg (09/27/2018), 100 mg (10/28/2018), 100 mg (10/29/2018), 100 mg (10/30/2018), 100 mg (10/31/2018), 100 mg (11/01/2018), 100 mg (12/02/2018), 100 mg (12/03/2018), 100 mg (12/04/2018), 100 mg (12/05/2018), 100 mg (12/06/2018)  for chemotherapy treatment.    01/20/2019 -  Chemotherapy   The patient had decitabine (DACOGEN) 45 mg in sodium chloride 0.9 %  50 mL chemo infusion, 20 mg/m2 = 45 mg, Intravenous,  Once, 4 of 6 cycles Dose modification: 15 mg/m2 (original dose 20 mg/m2, Cycle 2, Reason: Other (see comments), Comment: neutropenic fever) Administration: 45 mg (01/20/2019), 45 mg (01/21/2019), 45 mg (01/22/2019), 45 mg (01/23/2019), 45 mg (01/24/2019), 35 mg (03/17/2019), 35 mg (03/18/2019), 35 mg (03/19/2019), 35 mg (03/20/2019), 35 mg (03/21/2019), 35 mg (04/22/2019), 35 mg (04/23/2019), 35 mg (04/24/2019), 35 mg (04/25/2019), 35 mg (05/26/2019) ondansetron (ZOFRAN) 8 mg in sodium chloride 0.9 % 50 mL IVPB, 8 mg (100 % of original dose 8 mg), Intravenous,  Once, 4 of 6 cycles Dose modification: 8 mg (original dose 8 mg, Cycle 1) Administration: 8 mg (01/20/2019), 8 mg (01/21/2019), 8 mg (01/22/2019), 8 mg (01/23/2019), 8 mg (01/24/2019), 8 mg (03/17/2019), 8 mg (03/18/2019), 8 mg (03/19/2019), 8 mg (03/20/2019), 8 mg (03/21/2019), 8 mg (04/22/2019), 8 mg (04/23/2019), 8 mg (04/24/2019), 8 mg (04/25/2019), 8 mg (05/26/2019)  for chemotherapy treatment.    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[place])  01/07/2019 Initial Diagnosis   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[place])   01/20/2019 -  Chemotherapy   The patient had decitabine (DACOGEN) 45 mg in sodium chloride 0.9 % 50 mL chemo infusion, 20 mg/m2 = 45 mg, Intravenous,  Once, 4 of 6 cycles Dose modification: 15 mg/m2 (original dose 20 mg/m2, Cycle 2, Reason: Other (see comments), Comment: neutropenic fever) Administration: 45 mg (01/20/2019), 45 mg (01/21/2019), 45 mg (01/22/2019), 45 mg (01/23/2019), 45 mg (01/24/2019), 35 mg (03/17/2019), 35 mg (03/18/2019), 35 mg (03/19/2019), 35 mg (03/20/2019), 35 mg (03/21/2019), 35 mg (04/22/2019), 35 mg (04/23/2019), 35 mg (04/24/2019), 35 mg (04/25/2019), 35 mg (05/26/2019) ondansetron (ZOFRAN) 8 mg in sodium chloride 0.9 %  50 mL IVPB, 8 mg (100 % of original dose 8 mg), Intravenous,  Once, 4 of 6 cycles Dose modification: 8 mg (original dose 8 mg, Cycle 1) Administration: 8 mg  (01/20/2019), 8 mg (01/21/2019), 8 mg (01/22/2019), 8 mg (01/23/2019), 8 mg (01/24/2019), 8 mg (03/17/2019), 8 mg (03/18/2019), 8 mg (03/19/2019), 8 mg (03/20/2019), 8 mg (03/21/2019), 8 mg (04/22/2019), 8 mg (04/23/2019), 8 mg (04/24/2019), 8 mg (04/25/2019), 8 mg (05/26/2019)  for chemotherapy treatment.       CANCER STAGING: Cancer Staging No matching staging information was found for the patient.   INTERVAL HISTORY:  [name] 74 y.o. male seen for follow-up and toxicity assessment prior to starting cycle 4 for AML.  He reports energy levels of 50% in appetite of 100%.  He had port placed.  Wife reports that his memory is declining.  Arthralgias are stable.  REVIEW OF SYSTEMS:  Review of Systems  Cardiovascular: Positive for leg swelling.  Musculoskeletal: Positive for arthralgias.  All other systems reviewed and are negative.    PAST MEDICAL/SURGICAL HISTORY:  Past Medical History:  Diagnosis Date  . Arthritis   . 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 Cancer ([place]) 01-28-2014   skin cancer  . CKD (chronic kidney disease), stage III   . GERD (gastroesophageal reflux disease)   . Heart murmur    NOTED DURING PHYSICAL WHEN HE WAS ENLISTING IN [place] , DIDNT KNOW UNTIL THAT TIME AND REPORTS , "THATS THE LAST I HEARD ABOUT IT "   . History of hypertension    no longer issue  . History of kidney stones   . History of malignant melanoma of skin    excision top of scalp 2015-- no recurrence  . History of urinary retention    post op lumbar fusion surgery 04/ 2016  . Hypertension   . 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 Left ureteral calculus   . Seasonal allergies   . Wears glasses   . Wears glasses   . Wears partial dentures    upper and lower   Past Surgical History:  Procedure Laterality Date  . ANKLE FUSION Right 2007  . CARPAL TUNNEL RELEASE Left 12/28/2009   w/ pulley release left  long finger  . CARPAL TUNNEL RELEASE Right 07/22/2013   Procedure: RIGHT CARPAL TUNNEL RELEASE;  Surgeon: Cammie Sickle., MD;  Location: [facility];  Service: Orthopedics;  Laterality: Right;  . COLONOSCOPY    . CYSTO/  LEFT RETROGRADE PYELOGRAM  11/21/2010  . CYSTOSCOPY WITH STENT PLACEMENT Left 03/09/2016   Procedure: CYSTOSCOPY WITH STENT PLACEMENT;  Surgeon: Franchot Gallo, MD;  Location[facility];  Service: Urology;  Laterality: Left;  . CYSTOSCOPY/RETROGRADE/URETEROSCOPY/STONE EXTRACTION WITH BASKET Left 03/09/2016   Procedure: CYSTOSCOPY/RETROGRADE/URETEROSCOPY/STONE EXTRACTION WITH BASKET;  Surgeon: Franchot Gallo, MD;  Location: [facility];  Service: Urology;  Laterality: Left;  . LEFT URETEROSCOPIC LASER LITHOTRIPSY STONE EXTRACTION/ STENT PLACEMENT  05/23/2010  . MOHS SURGERY     TOP OF THE HEAD   . ORIF ANKLE FRACTURE Right 1978  . PORT-A-CATH REMOVAL Right 02/14/2019   Procedure: MINOR REMOVAL PORT-A-CATH;  Surgeon: Aviva Signs, MD;  Location: [facility];  Service: General;  Laterality: Right;  . PORTACATH PLACEMENT Right 08/19/2018   Procedure: INSERTION PORT-A-CATH (attached catheter in right subclavian);  Surgeon: Aviva Signs, MD;  Location: [facility];  Service:  General;  Laterality: Right;  . PORTACATH PLACEMENT Left 05/16/2019   Procedure: INSERTION PORT-A-CATH (attached catheter in left subclavian);  Surgeon: Aviva Signs, MD;  Location: [facility];  Service: General;  Laterality: Left;  . POSTERIOR LUMBAR FUSION  08/21/2014   laminectomy and decompression L2 -- L5  . RIGHT LOWER LEG SURGERY  X3  1975 to 1976   including ORIF  . TONSILLECTOMY AND ADENOIDECTOMY  1986  . UMBILICAL HERNIA REPAIR  2009 approx     SOCIAL HISTORY:  Social History   Socioeconomic History  . Marital status: Married    Spouse name: Not on file  . Number of children: Not on file  . Years of education: Not on file  . Highest education level:  Not on file  Occupational History  . Not on file  Tobacco Use  . Smoking status: Former Smoker    Years: 20.00    Types: Cigarettes    Quit date: 07/17/1986    Years since quitting: 32.8  . Smokeless tobacco: Never Used  Substance and Sexual Activity  . Alcohol use: Yes    Alcohol/week: 7.0 - 14.0 standard drinks    Types: 7 - 14 Cans of beer per week    Comment: 1 -2 beer daily  . Drug use: No  . Sexual activity: Not Currently  Other Topics Concern  . Not on file  Social History Narrative  . Not on file   Social Determinants of Health   Financial Resource Strain: Unknown  . Difficulty of Paying Living Expenses: Patient refused  Food Insecurity: Unknown  . Worried About Charity fundraiser in the Last Year: Patient refused  . Ran Out of Food in the Last Year: Patient refused  Transportation Needs: Unknown  . Lack of Transportation (Medical): Patient refused  . Lack of Transportation (Non-Medical): Patient refused  Physical Activity: Unknown  . Days of Exercise per Week: Patient refused  . Minutes of Exercise per Session: Patient refused  Stress: Unknown  . Feeling of Stress : Patient refused  Social Connections: Unknown  . Frequency of Communication with Friends and Family: Patient refused  . Frequency of Social Gatherings with Friends and Family: Patient refused  . Attends Religious Services: Patient refused  . Active Member of Clubs or Organizations: Patient refused  . Attends Archivist Meetings: Patient refused  . Marital Status: Patient refused  Intimate Partner Violence: Unknown  . Fear of Current or Ex-Partner: Patient refused  . Emotionally Abused: Patient refused  . Physically Abused: Patient refused  . Sexually Abused: Patient refused    FAMILY HISTORY:  Family History  Problem Relation Age of Onset  . Stroke Mother   . Prostate cancer Father   . Bone cancer Father   . Diverticulitis Father   . Rheum arthritis Sister   . Urinary tract  infection Sister   . Colon cancer Neg Hx     CURRENT MEDICATIONS:  Outpatient Encounter Medications as of 05/26/2019  Medication Sig Note  . acetaminophen (TYLENOL) 500 MG tablet Take 1,000 mg by mouth 3 (three) times daily.    Marland Kitchen allopurinol (ZYLOPRIM) 300 MG tablet Take 300 mg by mouth at bedtime.    . cetirizine (ZYRTEC) 10 MG tablet Take 10 mg by mouth daily. IN THE MORNING   . [employer] TRIPLE MAGNESIUM COMPLEX PO Take 1 capsule by mouth at bedtime. With Vitamin d3   . docusate sodium (COLACE) 100 MG capsule Take 100 mg by mouth at bedtime.    Marland Kitchen  hydrochlorothiazide (HYDRODIURIL) 12.5 MG tablet Take 12.5 mg by mouth daily.    . lansoprazole (PREVACID) 15 MG capsule Take 15 mg by mouth daily.    Marland Kitchen lisinopril (ZESTRIL) 2.5 MG tablet Take 2.5 mg by mouth daily.   . tamsulosin (FLOMAX) 0.4 MG CAPS capsule Take 0.4 mg by mouth every evening.    . venetoclax 100 MG TABS Take 200 mg by mouth daily. 05/15/2019: On hold (patient is not currently receiving chemo treatment)  . diclofenac sodium (VOLTAREN) 1 % GEL Apply 1 g topically 3 (three) times daily as needed (knee pain.).    Marland Kitchen lidocaine-prilocaine (EMLA) cream Apply 1 application topically See admin instructions. ONE HOUR PRIOR TO CHEMOTHERAPY APPOINTMENT   . traMADol (ULTRAM) 50 MG tablet TAKE 1 TABLET BY MOUTH THREE TIMES DAILY AS NEEDED FOR PAIN (Patient not taking: No sig reported)    No facility-administered encounter medications on file as of 05/26/2019.    ALLERGIES:  No Known Allergies   PHYSICAL EXAM:  ECOG Performance status: 1  Vitals:   05/26/19 0824  BP: 121/78  Pulse: 80  Resp: 18  Temp: 97.6 F (36.4 C)  SpO2: 95%   Filed Weights   05/26/19 0824  Weight: 222 lb 12.8 oz (101.1 kg)    Physical Exam Constitutional:      Appearance: Normal appearance.  HENT:     Mouth/Throat:     Mouth: Mucous membranes are moist.     Pharynx: Oropharynx is clear.  Eyes:     Extraocular Movements: Extraocular movements intact.      Conjunctiva/sclera: Conjunctivae normal.  Cardiovascular:     Rate and Rhythm: Normal rate and regular rhythm.     Heart sounds: Normal heart sounds.  Pulmonary:     Effort: Pulmonary effort is normal.     Breath sounds: Normal breath sounds.  Abdominal:     General: There is no distension.     Palpations: Abdomen is soft. There is no mass.  Musculoskeletal:        General: No deformity. Normal range of motion.     Cervical back: Normal range of motion.  Skin:    General: Skin is warm.  Neurological:     General: No focal deficit present.     Mental Status: He is alert and oriented to person, place, and time.  Psychiatric:        Mood and Affect: Mood normal.        Behavior: Behavior normal.      LABORATORY DATA:  I have reviewed the labs as listed.  CBC    Component Value Date/Time   WBC 2.7 (L) 05/26/2019 0820   RBC 4.74 05/26/2019 0820   HGB 15.1 05/26/2019 0820   HCT 46.2 05/26/2019 0820   PLT 158 05/26/2019 0820   MCV 97.5 05/26/2019 0820   MCH 31.9 05/26/2019 0820   MCHC 32.7 05/26/2019 0820   RDW 15.6 (H) 05/26/2019 0820   LYMPHSABS 1.3 05/26/2019 0820   MONOABS 0.5 05/26/2019 0820   EOSABS 0.1 05/26/2019 0820   BASOSABS 0.0 05/26/2019 0820   CMP Latest Ref Rng & Units 05/26/2019 05/13/2019 05/06/2019  Glucose 70 - 99 mg/dL 110(H) 104(H) 104(H)  BUN 8 - 23 mg/dL 35(H) 25(H) 24(H)  Creatinine 0.61 - 1.24 mg/dL 1.26(H) 1.11 0.99  Sodium 135 - 145 mmol/L 138 141 141  Potassium 3.5 - 5.1 mmol/L 4.2 4.0 4.4  Chloride 98 - 111 mmol/L 106 107 106  CO2 22 - 32 mmol/L 24  26 25  Calcium 8.9 - 10.3 mg/dL 9.5 9.4 9.1  Total Protein 6.5 - 8.1 g/dL 6.5 6.4(L) 6.1(L)  Total Bilirubin 0.3 - 1.2 mg/dL 0.6 0.7 0.5  Alkaline Phos 38 - 126 U/L 63 58 61  AST 15 - 41 U/L '26 20 24  ' ALT 0 - 44 U/L 36 28 37        ASSESSMENT & PLAN:   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HCC) 1.  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  Flow  cytometry-12% blasts.  Chromosome analysis and FISH panel normal. -4 cycles of Vidaza from 08/22/2018 through 12/02/2018. -Repeat bone marrow biopsy on 12/23/2018 at [facility] showed 25% blasts and 90% cellular marrow, consistent with AML with MDS related changes.  Flow cytometry showed 11% blasts.  MDS FISH panel was normal. -NGS panel did not show any targetable mutations. -Cycle 1 of decitabine (20 mg/m2) with venetoclax 300 mg daily on 01/20/2019. -He was hospitalized from 02/09/2019 through 02/18/2019 with neutropenic fever.  His venetoclax was held at that time. -His blood cultures grew Streptococcus Infantarius.  His Port-A-Cath was removed on 02/14/2019.  He also had lung infiltrates with cough which resolved.  He had a PICC line placed in the left arm. -Bone marrow biopsy on 03/05/2019 did not show any evidence of leukemia. -Cycle 2 of decitabine (15 mg/M 2) on 03/17/2019.  Venetoclax was dose reduced to 200 mg, took for 2 weeks until 03/31/2019. -Cycle 3 chemotherapy on 04/22/2019 for 4 days of decitabine 15 mg/m2. -He has port placed last week.  He is feeling fine.  We reviewed his labs.  ANC is 800. -He will proceed with cycle 4 of decitabine at 15 mg per metered squared. -We will see him back in 1 week for follow-up and blood counts. -He was told to start on venetoclax 200 mg daily.   2.  CKD: -Creatinine is 1.26 today.  3.  Bilateral knee pains: -He will continue tramadol 3 times a day as needed along with Tylenol.  4.  Hypomagnesemia: -He will continue magnesium once daily.    Orders placed this encounter:  No orders of the defined types were placed in this encounter.     [name], MD [place] [phone]

## 2019-05-26 NOTE — Patient Instructions (Addendum)
[place] at [facility] Discharge Instructions  You were seen today by Dr. Delton Coombes. He went over your recent lab results. Start taking the Venetoclax 2 pills daily. He will see you back in 1 week for labs, and follow up.   Thank you for choosing [place] at [facility] to provide your oncology and hematology care.  To afford each patient quality time with our provider, please arrive at least 15 minutes before your scheduled appointment time.   If you have a lab appointment with the [facility] please come in thru the  Main Entrance and check in at the main information desk  You need to re-schedule your appointment should you arrive 10 or more minutes late.  We strive to give you quality time with our providers, and arriving late affects you and other patients whose appointments are after yours.  Also, if you no show three or more times for appointments you may be dismissed from the clinic at the providers discretion.     Again, thank you for choosing [facility].  Our hope is that these requests will decrease the amount of time that you wait before being seen by our physicians.       _____________________________________________________________  Should you have questions after your visit to [facility], please contact our office at [phone] between the hours of 8:00 a.m. and 4:30 p.m.  Voicemails left after 4:00 p.m. will not be returned until the following business day.  For prescription refill requests, have your pharmacy contact our office and allow 72 hours.    Cancer Center Support Programs:   > Cancer Support Group  2nd Tuesday of the month 1pm-2pm, Journey Room

## 2019-05-27 ENCOUNTER — Inpatient Hospital Stay (HOSPITAL_COMMUNITY): Payer: Medicare Other

## 2019-05-27 VITALS — BP 120/73 | HR 70 | Temp 97.1°F | Resp 16

## 2019-05-27 DIAGNOSIS — C9200 Acute myeloblastic leukemia, not having achieved remission: Secondary | ICD-10-CM

## 2019-05-27 DIAGNOSIS — D46Z Other myelodysplastic syndromes: Secondary | ICD-10-CM

## 2019-05-27 DIAGNOSIS — Z5111 Encounter for antineoplastic chemotherapy: Secondary | ICD-10-CM | POA: Diagnosis not present

## 2019-05-27 DIAGNOSIS — Z95828 Presence of other vascular implants and grafts: Secondary | ICD-10-CM

## 2019-05-27 MED ORDER — SODIUM CHLORIDE 0.9 % IV SOLN
15.0000 mg/m2 | Freq: Once | INTRAVENOUS | Status: AC
  Administered 2019-05-27: 35 mg via INTRAVENOUS
  Filled 2019-05-27: qty 7

## 2019-05-27 MED ORDER — HEPARIN SOD (PORK) LOCK FLUSH 100 UNIT/ML IV SOLN
500.0000 [IU] | Freq: Once | INTRAVENOUS | Status: AC | PRN
  Administered 2019-05-27: 500 [IU]

## 2019-05-27 MED ORDER — SODIUM CHLORIDE 0.9% FLUSH
10.0000 mL | INTRAVENOUS | Status: DC | PRN
  Administered 2019-05-27: 10 mL

## 2019-05-27 MED ORDER — SODIUM CHLORIDE 0.9 % IV SOLN: Freq: Once | INTRAVENOUS | Status: AC

## 2019-05-27 MED ORDER — SODIUM CHLORIDE 0.9 % IV SOLN
8.0000 mg | Freq: Once | INTRAVENOUS | Status: AC
  Administered 2019-05-27: 8 mg via INTRAVENOUS
  Filled 2019-05-27: qty 4

## 2019-05-27 NOTE — Progress Notes (Signed)
Treatment given per orders. Patient tolerated it well without problems. Vitals stable and discharged home from clinic ambulatory. Follow up as scheduled.  

## 2019-05-28 ENCOUNTER — Other Ambulatory Visit: Payer: Self-pay

## 2019-05-28 ENCOUNTER — Inpatient Hospital Stay (HOSPITAL_COMMUNITY): Payer: Medicare Other

## 2019-05-28 VITALS — BP 127/72 | HR 62 | Temp 96.9°F | Resp 18

## 2019-05-28 DIAGNOSIS — Z5111 Encounter for antineoplastic chemotherapy: Secondary | ICD-10-CM | POA: Diagnosis not present

## 2019-05-28 DIAGNOSIS — Z95828 Presence of other vascular implants and grafts: Secondary | ICD-10-CM

## 2019-05-28 DIAGNOSIS — D46Z Other myelodysplastic syndromes: Secondary | ICD-10-CM

## 2019-05-28 DIAGNOSIS — C9200 Acute myeloblastic leukemia, not having achieved remission: Secondary | ICD-10-CM

## 2019-05-28 MED ORDER — SODIUM CHLORIDE 0.9% FLUSH
10.0000 mL | INTRAVENOUS | Status: DC | PRN
  Administered 2019-05-28: 10 mL

## 2019-05-28 MED ORDER — SODIUM CHLORIDE 0.9 % IV SOLN: Freq: Once | INTRAVENOUS | Status: AC

## 2019-05-28 MED ORDER — SODIUM CHLORIDE 0.9 % IV SOLN
8.0000 mg | Freq: Once | INTRAVENOUS | Status: AC
  Administered 2019-05-28: 8 mg via INTRAVENOUS
  Filled 2019-05-28: qty 4

## 2019-05-28 MED ORDER — SODIUM CHLORIDE 0.9 % IV SOLN
15.0000 mg/m2 | Freq: Once | INTRAVENOUS | Status: AC
  Administered 2019-05-28: 35 mg via INTRAVENOUS
  Filled 2019-05-28: qty 7

## 2019-05-28 MED ORDER — HEPARIN SOD (PORK) LOCK FLUSH 100 UNIT/ML IV SOLN
500.0000 [IU] | Freq: Once | INTRAVENOUS | Status: AC | PRN
  Administered 2019-05-28: 500 [IU]

## 2019-05-28 NOTE — Progress Notes (Signed)
Patient presents today for treatment. Vital signs within parameters for treatment. Patient has no complaints of any changes since yesterday's visit. MAR reviewed.   Treatment given today per MD orders. Tolerated infusion without adverse affects. Vital signs stable. No complaints at this time. Discharged from clinic ambulatory. F/U with [facility] as scheduled.

## 2019-05-28 NOTE — Patient Instructions (Signed)
[facility] Discharge Instructions for Patients Receiving Chemotherapy  Today you received the following chemotherapy agents   To help prevent nausea and vomiting after your treatment, we encourage you to take your nausea medication   If you develop nausea and vomiting that is not controlled by your nausea medication, call the clinic.   BELOW ARE SYMPTOMS THAT SHOULD BE REPORTED IMMEDIATELY:  *FEVER GREATER THAN 100.5 F  *CHILLS WITH OR WITHOUT FEVER  NAUSEA AND VOMITING THAT IS NOT CONTROLLED WITH YOUR NAUSEA MEDICATION  *UNUSUAL SHORTNESS OF BREATH  *UNUSUAL BRUISING OR BLEEDING  TENDERNESS IN MOUTH AND THROAT WITH OR WITHOUT PRESENCE OF ULCERS  *URINARY PROBLEMS  *BOWEL PROBLEMS  UNUSUAL RASH Items with * indicate a potential emergency and should be followed up as soon as possible.  Feel free to call the clinic should you have any questions or concerns. The clinic phone number is [phone].  Please show the CHEMO ALERT CARD at check-in to the Emergency Department and triage nurse.   

## 2019-05-29 ENCOUNTER — Encounter (HOSPITAL_COMMUNITY): Payer: Self-pay

## 2019-05-29 ENCOUNTER — Inpatient Hospital Stay (HOSPITAL_COMMUNITY): Payer: Medicare Other

## 2019-05-29 VITALS — BP 117/75 | HR 78 | Temp 97.8°F | Resp 18

## 2019-05-29 DIAGNOSIS — Z95828 Presence of other vascular implants and grafts: Secondary | ICD-10-CM

## 2019-05-29 DIAGNOSIS — D46Z Other myelodysplastic syndromes: Secondary | ICD-10-CM

## 2019-05-29 DIAGNOSIS — Z5111 Encounter for antineoplastic chemotherapy: Secondary | ICD-10-CM | POA: Diagnosis not present

## 2019-05-29 DIAGNOSIS — C9200 Acute myeloblastic leukemia, not having achieved remission: Secondary | ICD-10-CM

## 2019-05-29 MED ORDER — HEPARIN SOD (PORK) LOCK FLUSH 100 UNIT/ML IV SOLN
500.0000 [IU] | Freq: Once | INTRAVENOUS | Status: AC | PRN
  Administered 2019-05-29: 500 [IU]

## 2019-05-29 MED ORDER — SODIUM CHLORIDE 0.9 % IV SOLN
15.0000 mg/m2 | Freq: Once | INTRAVENOUS | Status: AC
  Administered 2019-05-29: 35 mg via INTRAVENOUS
  Filled 2019-05-29: qty 7

## 2019-05-29 MED ORDER — SODIUM CHLORIDE 0.9 % IV SOLN
8.0000 mg | Freq: Once | INTRAVENOUS | Status: AC
  Administered 2019-05-29: 8 mg via INTRAVENOUS
  Filled 2019-05-29: qty 4

## 2019-05-29 MED ORDER — SODIUM CHLORIDE 0.9 % IV SOLN: Freq: Once | INTRAVENOUS | Status: AC

## 2019-05-29 MED ORDER — SODIUM CHLORIDE 0.9% FLUSH
10.0000 mL | INTRAVENOUS | Status: DC | PRN
  Administered 2019-05-29: 10 mL

## 2019-05-29 NOTE — Progress Notes (Signed)
No new issues today. Proceed as planned.  Treatment given per orders. Patient tolerated it well without problems. Vitals stable and discharged home from clinic ambulatory. Follow up as scheduled.

## 2019-05-29 NOTE — Patient Instructions (Signed)

## 2019-05-30 ENCOUNTER — Inpatient Hospital Stay (HOSPITAL_COMMUNITY): Payer: Medicare Other

## 2019-05-30 ENCOUNTER — Encounter (HOSPITAL_COMMUNITY): Payer: Self-pay

## 2019-05-30 ENCOUNTER — Other Ambulatory Visit: Payer: Self-pay

## 2019-05-30 VITALS — BP 113/73 | HR 71 | Temp 97.3°F | Resp 18

## 2019-05-30 DIAGNOSIS — Z95828 Presence of other vascular implants and grafts: Secondary | ICD-10-CM

## 2019-05-30 DIAGNOSIS — D46Z Other myelodysplastic syndromes: Secondary | ICD-10-CM

## 2019-05-30 DIAGNOSIS — Z5111 Encounter for antineoplastic chemotherapy: Secondary | ICD-10-CM | POA: Diagnosis not present

## 2019-05-30 DIAGNOSIS — C9200 Acute myeloblastic leukemia, not having achieved remission: Secondary | ICD-10-CM

## 2019-05-30 MED ORDER — SODIUM CHLORIDE 0.9 % IV SOLN: Freq: Once | INTRAVENOUS | Status: AC

## 2019-05-30 MED ORDER — HEPARIN SOD (PORK) LOCK FLUSH 100 UNIT/ML IV SOLN
500.0000 [IU] | Freq: Once | INTRAVENOUS | Status: AC | PRN
  Administered 2019-05-30: 500 [IU]

## 2019-05-30 MED ORDER — SODIUM CHLORIDE 0.9 % IV SOLN
8.0000 mg | Freq: Once | INTRAVENOUS | Status: AC
  Administered 2019-05-30: 8 mg via INTRAVENOUS
  Filled 2019-05-30: qty 4

## 2019-05-30 MED ORDER — SODIUM CHLORIDE 0.9% FLUSH
10.0000 mL | INTRAVENOUS | Status: DC | PRN
  Administered 2019-05-30: 10 mL

## 2019-05-30 MED ORDER — SODIUM CHLORIDE 0.9 % IV SOLN
15.0000 mg/m2 | Freq: Once | INTRAVENOUS | Status: AC
  Administered 2019-05-30: 35 mg via INTRAVENOUS
  Filled 2019-05-30: qty 7

## 2019-05-30 NOTE — Progress Notes (Signed)
To treatment room for chemotherapy.  Port site clean and dry.  Flushed easily with no complaints of pain.  No s/s of distress noted.   Patient tolerated chemotherapy with no complaints voiced.  Port site clean and dry with no bruising or swelling noted at site.  Good blood return noted before and after administration of chemotherapy.  Band aid applied.  Patient left ambulatory with VSS and no s/s of distress noted.

## 2019-06-02 ENCOUNTER — Inpatient Hospital Stay (HOSPITAL_BASED_OUTPATIENT_CLINIC_OR_DEPARTMENT_OTHER): Payer: Medicare Other | Admitting: Hematology

## 2019-06-02 ENCOUNTER — Encounter (HOSPITAL_COMMUNITY): Payer: Self-pay | Admitting: Hematology

## 2019-06-02 ENCOUNTER — Other Ambulatory Visit: Payer: Self-pay

## 2019-06-02 ENCOUNTER — Inpatient Hospital Stay (HOSPITAL_COMMUNITY): Payer: Medicare Other

## 2019-06-02 VITALS — BP 109/57 | HR 60 | Temp 97.5°F | Resp 18 | Wt 222.2 lb

## 2019-06-02 DIAGNOSIS — C9200 Acute myeloblastic leukemia, not having achieved remission: Secondary | ICD-10-CM

## 2019-06-02 DIAGNOSIS — D46Z Other myelodysplastic syndromes: Secondary | ICD-10-CM

## 2019-06-02 DIAGNOSIS — Z5111 Encounter for antineoplastic chemotherapy: Secondary | ICD-10-CM | POA: Diagnosis not present

## 2019-06-02 LAB — COMPREHENSIVE METABOLIC PANEL
ALT: 31 U/L (ref 0–44)
AST: 22 U/L (ref 15–41)
Albumin: 3.9 g/dL (ref 3.5–5.0)
Alkaline Phosphatase: 63 U/L (ref 38–126)
Anion gap: 10 (ref 5–15)
BUN: 39 mg/dL — ABNORMAL HIGH (ref 8–23)
CO2: 27 mmol/L (ref 22–32)
Calcium: 9.7 mg/dL (ref 8.9–10.3)
Chloride: 102 mmol/L (ref 98–111)
Creatinine, Ser: 1.54 mg/dL — ABNORMAL HIGH (ref 0.61–1.24)
GFR calc Af Amer: 51 mL/min — ABNORMAL LOW (ref >60)
GFR calc non Af Amer: 44 mL/min — ABNORMAL LOW (ref >60)
Glucose, Bld: 98 mg/dL (ref 70–99)
Potassium: 4.6 mmol/L (ref 3.5–5.1)
Sodium: 139 mmol/L (ref 135–145)
Total Bilirubin: 0.8 mg/dL (ref 0.3–1.2)
Total Protein: 6.4 g/dL — ABNORMAL LOW (ref 6.5–8.1)

## 2019-06-02 LAB — URIC ACID: Uric Acid, Serum: 6.1 mg/dL (ref 3.7–8.6)

## 2019-06-02 LAB — CBC WITH DIFFERENTIAL/PLATELET
Abs Immature Granulocytes: 0.01 10*3/uL (ref 0.00–0.07)
Basophils Absolute: 0 10*3/uL (ref 0.0–0.1)
Basophils Relative: 0 %
Eosinophils Absolute: 0 10*3/uL (ref 0.0–0.5)
Eosinophils Relative: 1 %
HCT: 45 % (ref 39.0–52.0)
Hemoglobin: 14.7 g/dL (ref 13.0–17.0)
Immature Granulocytes: 0 %
Lymphocytes Relative: 27 %
Lymphs Abs: 1 10*3/uL (ref 0.7–4.0)
MCH: 31.8 pg (ref 26.0–34.0)
MCHC: 32.7 g/dL (ref 30.0–36.0)
MCV: 97.4 fL (ref 80.0–100.0)
Monocytes Absolute: 0.3 10*3/uL (ref 0.1–1.0)
Monocytes Relative: 8 %
Neutro Abs: 2.4 10*3/uL (ref 1.7–7.7)
Neutrophils Relative %: 64 %
Platelets: 125 10*3/uL — ABNORMAL LOW (ref 150–400)
RBC: 4.62 MIL/uL (ref 4.22–5.81)
RDW: 15.9 % — ABNORMAL HIGH (ref 11.5–15.5)
WBC: 3.7 10*3/uL — ABNORMAL LOW (ref 4.0–10.5)
nRBC: 0 % (ref 0.0–0.2)

## 2019-06-02 LAB — PHOSPHORUS: Phosphorus: 4.1 mg/dL (ref 2.5–4.6)

## 2019-06-02 LAB — MAGNESIUM: Magnesium: 2.1 mg/dL (ref 1.7–2.4)

## 2019-06-02 MED ORDER — SODIUM CHLORIDE 0.9 % IV SOLN: Freq: Once | INTRAVENOUS | Status: AC

## 2019-06-02 MED ORDER — SODIUM CHLORIDE 0.9% FLUSH
10.0000 mL | Freq: Once | INTRAVENOUS | Status: AC
  Administered 2019-06-02: 10 mL via INTRAVENOUS

## 2019-06-02 MED ORDER — HEPARIN SOD (PORK) LOCK FLUSH 100 UNIT/ML IV SOLN
500.0000 [IU] | Freq: Once | INTRAVENOUS | Status: AC
  Administered 2019-06-02: 500 [IU] via INTRAVENOUS

## 2019-06-02 NOTE — Assessment & Plan Note (Addendum)
1.  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  Flow cytometry-12% blasts.  Chromosome analysis and FISH panel normal. -4 cycles of Vidaza from 08/22/2018 through 12/02/2018. -Repeat bone marrow biopsy on 12/23/2018 at [facility] showed 25% blasts and 90% cellular marrow, consistent with AML with MDS related changes.  Flow cytometry showed 11% blasts.  MDS FISH panel was normal. -NGS panel did not show any targetable mutations. -Cycle 1 of decitabine (20 mg/m2) with venetoclax 300 mg daily on 01/20/2019. -He was hospitalized from 02/09/2019 through 02/18/2019 with neutropenic fever.  His venetoclax was held at that time. -His blood cultures grew Streptococcus Infantarius.  His Port-A-Cath was removed on 02/14/2019.  He also had lung infiltrates with cough which resolved.  He had a PICC line placed in the left arm. -Bone marrow biopsy on 03/05/2019 did not show any evidence of leukemia. -Cycle 2 of decitabine (15 mg/M 2) on 03/17/2019.  Venetoclax was dose reduced to 200 mg, took for 2 weeks until 03/31/2019. -Cycle 3 chemotherapy on 04/22/2019 for 4 days of decitabine 15 mg/m2. -Cycle 4 of decitabine on 05/26/2019.  Venetoclax 200 mg daily started on 05/26/2019. -He is tolerating venetoclax very well.  We reviewed labs which showed white count 3.7 with normal ANC.  Platelet count is 125.  Hemoglobin was normal. -He was told to continue venetoclax for another week.  He will be seen back next Monday with repeat labs.   2.  CKD: -His creatinine increased to 1.54 today.  Previously it was 1.26. -I have reviewed his medications.  We will hold his lisinopril dose until next week.  He is also on hydrochlorothiazide. -We will give him 500 mL of normal saline today.  I will check him back in 1 week with repeat labs. -Blood pressure today is 109/51.  He reports occasionally the blood pressure shoots up to 967 systolic at home.  He was told to take lisinopril if it is high.  3.   Bilateral knee pains: -He will continue tramadol 3 times a day as needed along with Tylenol.  4.  Hypomagnesemia: -He will continue magnesium once daily.  Magnesium is normal today.

## 2019-06-02 NOTE — Progress Notes (Signed)
Patient has been assessed, vital signs and labs have been reviewed by Dr. Delton Coombes.Creatinine is 1.54 today, please give 559ml NS over 1 hour today per Dr. Delton Coombes. He will see him back in 1 week with labs.

## 2019-06-02 NOTE — Progress Notes (Signed)
Labs reviewed with MD today. Hydration fluids given today per orders.  Patient tolerated it well without problems. Vitals stable and discharged home from clinic ambulatory. Follow up as scheduled.

## 2019-06-02 NOTE — Progress Notes (Signed)
[facility] [address], [place] [address]   CLINIC:  Medical Oncology/Hematology  PCP:  Richard Sos, MD [place] [address] [phone]   REASON FOR VISIT:  Follow-up for newly diagnosed AML.  CURRENT THERAPY: Decitabine and venetoclax  BRIEF ONCOLOGIC HISTORY:  Oncology History  MDS (myelodysplastic syndrome), high grade (HCC)  08/14/2018 Initial Diagnosis   MDS (myelodysplastic syndrome), high grade ([place])   08/22/2018 - 12/31/2018 Chemotherapy   The patient had palonosetron (ALOXI) injection 0.25 mg, 0.25 mg, Intravenous,  Once, 4 of 6 cycles Administration: 0.25 mg (08/22/2018), 0.25 mg (08/26/2018), 0.25 mg (08/28/2018), 0.25 mg (08/30/2018), 0.25 mg (10/01/2018), 0.25 mg (10/02/2018), 0.25 mg (11/04/2018), 0.25 mg (09/23/2018), 0.25 mg (09/25/2018), 0.25 mg (09/27/2018), 0.25 mg (10/28/2018), 0.25 mg (10/30/2018), 0.25 mg (11/01/2018), 0.25 mg (12/02/2018), 0.25 mg (12/04/2018), 0.25 mg (12/06/2018) azaCITIDine (VIDAZA) 100 mg in sodium chloride 0.9 % 50 mL chemo infusion, 110 mg (66.7 % of original dose 75 mg/m2), Intravenous, Once, 4 of 6 cycles Dose modification: 50 mg/m2 (original dose 75 mg/m2, Cycle 1, Reason: Provider Judgment), 50 mg/m2 (original dose 75 mg/m2, Cycle 2, Reason: Provider Judgment) Administration: 100 mg (08/22/2018), 100 mg (08/23/2018), 100 mg (08/26/2018), 100 mg (08/27/2018), 100 mg (08/28/2018), 100 mg (08/29/2018), 100 mg (08/30/2018), 100 mg (10/01/2018), 100 mg (10/02/2018), 100 mg (11/04/2018), 100 mg (11/05/2018), 100 mg (09/23/2018), 100 mg (09/24/2018), 100 mg (09/25/2018), 100 mg (09/26/2018), 100 mg (09/27/2018), 100 mg (10/28/2018), 100 mg (10/29/2018), 100 mg (10/30/2018), 100 mg (10/31/2018), 100 mg (11/01/2018), 100 mg (12/02/2018), 100 mg (12/03/2018), 100 mg (12/04/2018), 100 mg (12/05/2018), 100 mg (12/06/2018)  for chemotherapy treatment.    01/20/2019 -  Chemotherapy   The patient had decitabine (DACOGEN) 45 mg in sodium chloride 0.9 %  50 mL chemo infusion, 20 mg/m2 = 45 mg, Intravenous,  Once, 4 of 6 cycles Dose modification: 15 mg/m2 (original dose 20 mg/m2, Cycle 2, Reason: Other (see comments), Comment: neutropenic fever) Administration: 45 mg (01/20/2019), 45 mg (01/21/2019), 45 mg (01/22/2019), 45 mg (01/23/2019), 45 mg (01/24/2019), 35 mg (03/17/2019), 35 mg (03/18/2019), 35 mg (03/19/2019), 35 mg (03/20/2019), 35 mg (03/21/2019), 35 mg (04/22/2019), 35 mg (04/23/2019), 35 mg (04/24/2019), 35 mg (04/25/2019), 35 mg (05/26/2019), 35 mg (05/27/2019), 35 mg (05/28/2019), 35 mg (05/29/2019), 35 mg (05/30/2019) ondansetron (ZOFRAN) 8 mg in sodium chloride 0.9 % 50 mL IVPB, 8 mg (100 % of original dose 8 mg), Intravenous,  Once, 4 of 6 cycles Dose modification: 8 mg (original dose 8 mg, Cycle 1) Administration: 8 mg (01/20/2019), 8 mg (01/21/2019), 8 mg (01/22/2019), 8 mg (01/23/2019), 8 mg (01/24/2019), 8 mg (03/17/2019), 8 mg (03/18/2019), 8 mg (03/19/2019), 8 mg (03/20/2019), 8 mg (03/21/2019), 8 mg (04/22/2019), 8 mg (04/23/2019), 8 mg (04/24/2019), 8 mg (04/25/2019), 8 mg (05/26/2019), 8 mg (05/27/2019), 8 mg (05/28/2019), 8 mg (05/29/2019), 8 mg (05/30/2019)  for chemotherapy treatment.    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[place])  01/07/2019 Initial Diagnosis   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[place])   01/20/2019 -  Chemotherapy   The patient had decitabine (DACOGEN) 45 mg in sodium chloride 0.9 % 50 mL chemo infusion, 20 mg/m2 = 45 mg, Intravenous,  Once, 4 of 6 cycles Dose modification: 15 mg/m2 (original dose 20 mg/m2, Cycle 2, Reason: Other (see comments), Comment: neutropenic fever) Administration: 45 mg (01/20/2019), 45 mg (01/21/2019), 45 mg (01/22/2019), 45 mg (01/23/2019), 45 mg (01/24/2019), 35 mg (03/17/2019), 35 mg (03/18/2019), 35 mg (03/19/2019), 35 mg (03/20/2019), 35 mg (03/21/2019),  35 mg (04/22/2019), 35 mg (04/23/2019), 35 mg (04/24/2019), 35 mg (04/25/2019), 35 mg (05/26/2019), 35 mg (05/27/2019), 35 mg (05/28/2019), 35 mg (05/29/2019), 35  mg (05/30/2019) ondansetron (ZOFRAN) 8 mg in sodium chloride 0.9 % 50 mL IVPB, 8 mg (100 % of original dose 8 mg), Intravenous,  Once, 4 of 6 cycles Dose modification: 8 mg (original dose 8 mg, Cycle 1) Administration: 8 mg (01/20/2019), 8 mg (01/21/2019), 8 mg (01/22/2019), 8 mg (01/23/2019), 8 mg (01/24/2019), 8 mg (03/17/2019), 8 mg (03/18/2019), 8 mg (03/19/2019), 8 mg (03/20/2019), 8 mg (03/21/2019), 8 mg (04/22/2019), 8 mg (04/23/2019), 8 mg (04/24/2019), 8 mg (04/25/2019), 8 mg (05/26/2019), 8 mg (05/27/2019), 8 mg (05/28/2019), 8 mg (05/29/2019), 8 mg (05/30/2019)  for chemotherapy treatment.       CANCER STAGING: Cancer Staging No matching staging information was found for the patient.   INTERVAL HISTORY:  [name] 74 y.o. male seen for follow-up and toxicity assessment.  He received cycle 4 of decitabine on 05/26/2019.  He is also taking venetoclax without any nausea vomiting or diarrhea.  Appetite is 100%.  Energy levels are 50%.  REVIEW OF SYSTEMS:  Review of Systems  Cardiovascular: Positive for leg swelling.  Musculoskeletal: Positive for arthralgias.  All other systems reviewed and are negative.    PAST MEDICAL/SURGICAL HISTORY:  Past Medical History:  Diagnosis Date  . Arthritis   . 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 Cancer ([place]) 01-28-2014   skin cancer  . CKD (chronic kidney disease), stage III   . GERD (gastroesophageal reflux disease)   . Heart murmur    NOTED DURING PHYSICAL WHEN HE WAS ENLISTING IN [place] , DIDNT KNOW UNTIL THAT TIME AND REPORTS , "THATS THE LAST I HEARD ABOUT IT "   . History of hypertension    no longer issue  . History of kidney stones   . History of malignant melanoma of skin    excision top of scalp 2015-- no recurrence  . History of urinary retention    post op lumbar fusion surgery 04/ 2016  . Hypertension   . Kidney dysfunction    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, MONITORED BY [facility] DR Richard Wallace   .  Left ureteral calculus   . Seasonal allergies   . Wears glasses   . Wears glasses   . Wears partial dentures    upper and lower   Past Surgical History:  Procedure Laterality Date  . ANKLE FUSION Right 2007  . CARPAL TUNNEL RELEASE Left 12/28/2009   w/ pulley release left long finger  . CARPAL TUNNEL RELEASE Right 07/22/2013   Procedure: RIGHT CARPAL TUNNEL RELEASE;  Surgeon: Cammie Sickle., MD;  Location: [facility];  Service: Orthopedics;  Laterality: Right;  . COLONOSCOPY    . CYSTO/  LEFT RETROGRADE PYELOGRAM  11/21/2010  . CYSTOSCOPY WITH STENT PLACEMENT Left 03/09/2016   Procedure: CYSTOSCOPY WITH STENT PLACEMENT;  Surgeon: Richard Gallo, MD;  Location: [facility];  Service: Urology;  Laterality: Left;  . CYSTOSCOPY/RETROGRADE/URETEROSCOPY/STONE EXTRACTION WITH BASKET Left 03/09/2016   Procedure: CYSTOSCOPY/RETROGRADE/URETEROSCOPY/STONE EXTRACTION WITH BASKET;  Surgeon: Richard Gallo, MD;  Location: [facility];  Service: Urology;  Laterality: Left;  . LEFT URETEROSCOPIC LASER LITHOTRIPSY STONE EXTRACTION/ STENT PLACEMENT  05/23/2010  . MOHS SURGERY     TOP OF THE HEAD   . ORIF ANKLE FRACTURE Right 1978  . PORT-A-CATH REMOVAL Right 02/14/2019   Procedure: MINOR REMOVAL  PORT-A-CATH;  Surgeon: Aviva Signs, MD;  Location: [facility];  Service: General;  Laterality: Right;  . PORTACATH PLACEMENT Right 08/19/2018   Procedure: INSERTION PORT-A-CATH (attached catheter in right subclavian);  Surgeon: Aviva Signs, MD;  Location: [facility];  Service: General;  Laterality: Right;  . PORTACATH PLACEMENT Left 05/16/2019   Procedure: INSERTION PORT-A-CATH (attached catheter in left subclavian);  Surgeon: Aviva Signs, MD;  Location: [facility];  Service: General;  Laterality: Left;  . POSTERIOR LUMBAR FUSION  08/21/2014   laminectomy and decompression L2 -- L5  . RIGHT LOWER LEG SURGERY  X3  1975 to 1976   including ORIF  . TONSILLECTOMY AND  ADENOIDECTOMY  1986  . UMBILICAL HERNIA REPAIR  2009 approx     SOCIAL HISTORY:  Social History   Socioeconomic History  . Marital status: Married    Spouse name: Not on file  . Number of children: Not on file  . Years of education: Not on file  . Highest education level: Not on file  Occupational History  . Not on file  Tobacco Use  . Smoking status: Former Smoker    Years: 20.00    Types: Cigarettes    Quit date: 07/17/1986    Years since quitting: 32.8  . Smokeless tobacco: Never Used  Substance and Sexual Activity  . Alcohol use: Yes    Alcohol/week: 7.0 - 14.0 standard drinks    Types: 7 - 14 Cans of beer per week    Comment: 1 -2 beer daily  . Drug use: No  . Sexual activity: Not Currently  Other Topics Concern  . Not on file  Social History Narrative  . Not on file   Social Determinants of Health   Financial Resource Strain: Unknown  . Difficulty of Paying Living Expenses: Patient refused  Food Insecurity: Unknown  . Worried About Charity fundraiser in the Last Year: Patient refused  . Ran Out of Food in the Last Year: Patient refused  Transportation Needs: Unknown  . Lack of Transportation (Medical): Patient refused  . Lack of Transportation (Non-Medical): Patient refused  Physical Activity: Unknown  . Days of Exercise per Week: Patient refused  . Minutes of Exercise per Session: Patient refused  Stress: Unknown  . Feeling of Stress : Patient refused  Social Connections: Unknown  . Frequency of Communication with Friends and Family: Patient refused  . Frequency of Social Gatherings with Friends and Family: Patient refused  . Attends Religious Services: Patient refused  . Active Member of Clubs or Organizations: Patient refused  . Attends Archivist Meetings: Patient refused  . Marital Status: Patient refused  Intimate Partner Violence: Unknown  . Fear of Current or Ex-Partner: Patient refused  . Emotionally Abused: Patient refused  .  Physically Abused: Patient refused  . Sexually Abused: Patient refused    FAMILY HISTORY:  Family History  Problem Relation Age of Onset  . Stroke Mother   . Prostate cancer Father   . Bone cancer Father   . Diverticulitis Father   . Rheum arthritis Sister   . Urinary tract infection Sister   . Colon cancer Neg Hx     CURRENT MEDICATIONS:  Outpatient Encounter Medications as of 06/02/2019  Medication Sig Note  . acetaminophen (TYLENOL) 500 MG tablet Take 1,000 mg by mouth 3 (three) times daily.    Marland Kitchen allopurinol (ZYLOPRIM) 300 MG tablet Take 300 mg by mouth at bedtime.    . cetirizine (ZYRTEC) 10 MG tablet Take 10  mg by mouth daily. IN THE MORNING   . [employer] TRIPLE MAGNESIUM COMPLEX PO Take 1 capsule by mouth at bedtime. With Vitamin d3   . docusate sodium (COLACE) 100 MG capsule Take 100 mg by mouth at bedtime.    . hydrochlorothiazide (HYDRODIURIL) 12.5 MG tablet Take 12.5 mg by mouth daily.    . lansoprazole (PREVACID) 15 MG capsule Take 15 mg by mouth daily.    Marland Kitchen lidocaine-prilocaine (EMLA) cream Apply 1 application topically See admin instructions. ONE HOUR PRIOR TO CHEMOTHERAPY APPOINTMENT   . lisinopril (ZESTRIL) 2.5 MG tablet Take 2.5 mg by mouth daily.   . tamsulosin (FLOMAX) 0.4 MG CAPS capsule Take 0.4 mg by mouth every evening.    . diclofenac sodium (VOLTAREN) 1 % GEL Apply 1 g topically 3 (three) times daily as needed (knee pain.).    Marland Kitchen traMADol (ULTRAM) 50 MG tablet TAKE 1 TABLET BY MOUTH THREE TIMES DAILY AS NEEDED FOR PAIN (Patient not taking: Reported on 06/02/2019)   . venetoclax 100 MG TABS Take 200 mg by mouth daily. 05/15/2019: On hold (patient is not currently receiving chemo treatment)   No facility-administered encounter medications on file as of 06/02/2019.    ALLERGIES:  No Known Allergies   PHYSICAL EXAM:  ECOG Performance status: 1  Vitals:   06/02/19 1047  BP: (!) 109/57  Pulse: 60  Resp: 18  Temp: (!) 97.5 F (36.4 C)  SpO2: 98%   Filed  Weights   06/02/19 1047  Weight: 222 lb 3.2 oz (100.8 kg)    Physical Exam Constitutional:      Appearance: Normal appearance.  HENT:     Mouth/Throat:     Mouth: Mucous membranes are moist.     Pharynx: Oropharynx is clear.  Eyes:     Extraocular Movements: Extraocular movements intact.     Conjunctiva/sclera: Conjunctivae normal.  Cardiovascular:     Rate and Rhythm: Normal rate and regular rhythm.     Heart sounds: Normal heart sounds.  Pulmonary:     Effort: Pulmonary effort is normal.     Breath sounds: Normal breath sounds.  Abdominal:     General: There is no distension.     Palpations: Abdomen is soft. There is no mass.  Musculoskeletal:        General: No deformity. Normal range of motion.     Cervical back: Normal range of motion.  Skin:    General: Skin is warm.  Neurological:     General: No focal deficit present.     Mental Status: He is alert and oriented to person, place, and time.  Psychiatric:        Mood and Affect: Mood normal.        Behavior: Behavior normal.      LABORATORY DATA:  I have reviewed the labs as listed.  CBC    Component Value Date/Time   WBC 3.7 (L) 06/02/2019 1044   RBC 4.62 06/02/2019 1044   HGB 14.7 06/02/2019 1044   HCT 45.0 06/02/2019 1044   PLT 125 (L) 06/02/2019 1044   MCV 97.4 06/02/2019 1044   MCH 31.8 06/02/2019 1044   MCHC 32.7 06/02/2019 1044   RDW 15.9 (H) 06/02/2019 1044   LYMPHSABS 1.0 06/02/2019 1044   MONOABS 0.3 06/02/2019 1044   EOSABS 0.0 06/02/2019 1044   BASOSABS 0.0 06/02/2019 1044   CMP Latest Ref Rng & Units 06/02/2019 05/26/2019 05/13/2019  Glucose 70 - 99 mg/dL 98 110(H) 104(H)  BUN 8 -  23 mg/dL 39(H) 35(H) 25(H)  Creatinine 0.61 - 1.24 mg/dL 1.54(H) 1.26(H) 1.11  Sodium 135 - 145 mmol/L 139 138 141  Potassium 3.5 - 5.1 mmol/L 4.6 4.2 4.0  Chloride 98 - 111 mmol/L 102 106 107  CO2 22 - 32 mmol/L '27 24 26  ' Calcium 8.9 - 10.3 mg/dL 9.7 9.5 9.4  Total Protein 6.5 - 8.1 g/dL 6.4(L) 6.5 6.4(L)   Total Bilirubin 0.3 - 1.2 mg/dL 0.8 0.6 0.7  Alkaline Phos 38 - 126 U/L 63 63 58  AST 15 - 41 U/L '22 26 20  ' ALT 0 - 44 U/L 31 36 28        ASSESSMENT & PLAN:   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HCC) 1.  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  Flow cytometry-12% blasts.  Chromosome analysis and FISH panel normal. -4 cycles of Vidaza from 08/22/2018 through 12/02/2018. -Repeat bone marrow biopsy on 12/23/2018 at [facility] showed 25% blasts and 90% cellular marrow, consistent with AML with MDS related changes.  Flow cytometry showed 11% blasts.  MDS FISH panel was normal. -NGS panel did not show any targetable mutations. -Cycle 1 of decitabine (20 mg/m2) with venetoclax 300 mg daily on 01/20/2019. -He was hospitalized from 02/09/2019 through 02/18/2019 with neutropenic fever.  His venetoclax was held at that time. -His blood cultures grew Streptococcus Infantarius.  His Port-A-Cath was removed on 02/14/2019.  He also had lung infiltrates with cough which resolved.  He had a PICC line placed in the left arm. -Bone marrow biopsy on 03/05/2019 did not show any evidence of leukemia. -Cycle 2 of decitabine (15 mg/M 2) on 03/17/2019.  Venetoclax was dose reduced to 200 mg, took for 2 weeks until 03/31/2019. -Cycle 3 chemotherapy on 04/22/2019 for 4 days of decitabine 15 mg/m2. -Cycle 4 of decitabine on 05/26/2019.  Venetoclax 200 mg daily started on 05/26/2019. -He is tolerating venetoclax very well.  We reviewed labs which showed white count 3.7 with normal ANC.  Platelet count is 125.  Hemoglobin was normal. -He was told to continue venetoclax for another week.  He will be seen back next Monday with repeat labs.   2.  CKD: -His creatinine increased to 1.54 today.  Previously it was 1.26. -I have reviewed his medications.  We will hold his lisinopril dose until next week.  He is also on hydrochlorothiazide. -We will give him 500 mL of normal saline today.  I will  check him back in 1 week with repeat labs. -Blood pressure today is 109/51.  He reports occasionally the blood pressure shoots up to 638 systolic at home.  He was told to take lisinopril if it is high.  3.  Bilateral knee pains: -He will continue tramadol 3 times a day as needed along with Tylenol.  4.  Hypomagnesemia: -He will continue magnesium once daily.  Magnesium is normal today.    Orders placed this encounter:  Orders Placed This Encounter  Procedures  . CBC with Differential/Platelet  . Comprehensive metabolic panel  . Magnesium      [name], MD [place] [phone]

## 2019-06-02 NOTE — Patient Instructions (Addendum)
[place] at [facility] Discharge Instructions  You were seen today by Dr. Delton Coombes. He went over your recent lab results. He will give you fluids today due to your Creatinine level being elevated. He will see you back in 1 week for labs and follow up.  STOP taking your Lisinopril until wee see you again.  Thank you for choosing [place] at [facility] to provide your oncology and hematology care.  To afford each patient quality time with our provider, please arrive at least 15 minutes before your scheduled appointment time.   If you have a lab appointment with the [place] please come in thru the  Main Entrance and check in at the main information desk  You need to re-schedule your appointment should you arrive 10 or more minutes late.  We strive to give you quality time with our providers, and arriving late affects you and other patients whose appointments are after yours.  Also, if you no show three or more times for appointments you may be dismissed from the clinic at the providers discretion.     Again, thank you for choosing [facility].  Our hope is that these requests will decrease the amount of time that you wait before being seen by our physicians.       _____________________________________________________________  Should you have questions after your visit to [facility], please contact our office at [phone] between the hours of 8:00 a.m. and 4:30 p.m.  Voicemails left after 4:00 p.m. will not be returned until the following business day.  For prescription refill requests, have your pharmacy contact our office and allow 72 hours.    Cancer Center Support Programs:   > Cancer Support Group  2nd Tuesday of the month 1pm-2pm, Journey Room

## 2019-06-03 ENCOUNTER — Other Ambulatory Visit (HOSPITAL_COMMUNITY): Payer: Self-pay | Admitting: *Deleted

## 2019-06-03 DIAGNOSIS — C9200 Acute myeloblastic leukemia, not having achieved remission: Secondary | ICD-10-CM

## 2019-06-03 MED ORDER — VENETOCLAX 100 MG PO TABS: 200.0000 mg | ORAL_TABLET | Freq: Every day | ORAL | 0 refills | Status: DC

## 2019-06-09 ENCOUNTER — Other Ambulatory Visit: Payer: Self-pay

## 2019-06-09 ENCOUNTER — Inpatient Hospital Stay (HOSPITAL_COMMUNITY): Payer: Medicare Other | Attending: Hematology | Admitting: Hematology

## 2019-06-09 ENCOUNTER — Inpatient Hospital Stay (HOSPITAL_COMMUNITY): Payer: Medicare Other

## 2019-06-09 ENCOUNTER — Encounter (HOSPITAL_COMMUNITY): Payer: Self-pay | Admitting: Hematology

## 2019-06-09 DIAGNOSIS — Z87891 Personal history of nicotine dependence: Secondary | ICD-10-CM | POA: Insufficient documentation

## 2019-06-09 DIAGNOSIS — Z85820 Personal history of malignant melanoma of skin: Secondary | ICD-10-CM | POA: Diagnosis not present

## 2019-06-09 DIAGNOSIS — C9200 Acute myeloblastic leukemia, not having achieved remission: Secondary | ICD-10-CM | POA: Diagnosis present

## 2019-06-09 DIAGNOSIS — Z823 Family history of stroke: Secondary | ICD-10-CM | POA: Insufficient documentation

## 2019-06-09 DIAGNOSIS — Z808 Family history of malignant neoplasm of other organs or systems: Secondary | ICD-10-CM | POA: Insufficient documentation

## 2019-06-09 DIAGNOSIS — Z8261 Family history of arthritis: Secondary | ICD-10-CM | POA: Diagnosis not present

## 2019-06-09 DIAGNOSIS — Z791 Long term (current) use of non-steroidal anti-inflammatories (NSAID): Secondary | ICD-10-CM | POA: Diagnosis not present

## 2019-06-09 DIAGNOSIS — Z8042 Family history of malignant neoplasm of prostate: Secondary | ICD-10-CM | POA: Diagnosis not present

## 2019-06-09 DIAGNOSIS — Z79899 Other long term (current) drug therapy: Secondary | ICD-10-CM | POA: Insufficient documentation

## 2019-06-09 DIAGNOSIS — Z9221 Personal history of antineoplastic chemotherapy: Secondary | ICD-10-CM | POA: Insufficient documentation

## 2019-06-09 DIAGNOSIS — N183 Chronic kidney disease, stage 3 unspecified: Secondary | ICD-10-CM | POA: Insufficient documentation

## 2019-06-09 DIAGNOSIS — I129 Hypertensive chronic kidney disease with stage 1 through stage 4 chronic kidney disease, or unspecified chronic kidney disease: Secondary | ICD-10-CM | POA: Diagnosis not present

## 2019-06-09 LAB — COMPREHENSIVE METABOLIC PANEL
ALT: 32 U/L (ref 0–44)
AST: 22 U/L (ref 15–41)
Albumin: 3.9 g/dL (ref 3.5–5.0)
Alkaline Phosphatase: 61 U/L (ref 38–126)
Anion gap: 8 (ref 5–15)
BUN: 28 mg/dL — ABNORMAL HIGH (ref 8–23)
CO2: 26 mmol/L (ref 22–32)
Calcium: 9.6 mg/dL (ref 8.9–10.3)
Chloride: 109 mmol/L (ref 98–111)
Creatinine, Ser: 1.08 mg/dL (ref 0.61–1.24)
GFR calc Af Amer: >60 mL/min (ref >60)
GFR calc non Af Amer: >60 mL/min (ref >60)
Glucose, Bld: 98 mg/dL (ref 70–99)
Potassium: 4.1 mmol/L (ref 3.5–5.1)
Sodium: 143 mmol/L (ref 135–145)
Total Bilirubin: 0.8 mg/dL (ref 0.3–1.2)
Total Protein: 6.5 g/dL (ref 6.5–8.1)

## 2019-06-09 LAB — CBC WITH DIFFERENTIAL/PLATELET
Abs Immature Granulocytes: 0.01 10*3/uL (ref 0.00–0.07)
Basophils Absolute: 0 10*3/uL (ref 0.0–0.1)
Basophils Relative: 1 %
Eosinophils Absolute: 0 10*3/uL (ref 0.0–0.5)
Eosinophils Relative: 1 %
HCT: 45.3 % (ref 39.0–52.0)
Hemoglobin: 14.6 g/dL (ref 13.0–17.0)
Immature Granulocytes: 1 %
Lymphocytes Relative: 44 %
Lymphs Abs: 0.9 10*3/uL (ref 0.7–4.0)
MCH: 31.3 pg (ref 26.0–34.0)
MCHC: 32.2 g/dL (ref 30.0–36.0)
MCV: 97 fL (ref 80.0–100.0)
Monocytes Absolute: 0.2 10*3/uL (ref 0.1–1.0)
Monocytes Relative: 9 %
Neutro Abs: 0.9 10*3/uL — ABNORMAL LOW (ref 1.7–7.7)
Neutrophils Relative %: 44 %
Platelets: 95 10*3/uL — ABNORMAL LOW (ref 150–400)
RBC: 4.67 MIL/uL (ref 4.22–5.81)
RDW: 16 % — ABNORMAL HIGH (ref 11.5–15.5)
WBC: 2.1 10*3/uL — ABNORMAL LOW (ref 4.0–10.5)
nRBC: 0 % (ref 0.0–0.2)

## 2019-06-09 LAB — MAGNESIUM: Magnesium: 1.8 mg/dL (ref 1.7–2.4)

## 2019-06-09 MED ORDER — OCTREOTIDE ACETATE 30 MG IM KIT
PACK | INTRAMUSCULAR | Status: AC
  Filled 2019-06-09: qty 1

## 2019-06-09 MED ORDER — OCTREOTIDE ACETATE 20 MG IM KIT
PACK | INTRAMUSCULAR | Status: AC
  Filled 2019-06-09: qty 1

## 2019-06-09 NOTE — Patient Instructions (Signed)
[place] at [facility] Discharge Instructions  You were seen today by Dr. Delton Coombes. He went over your recent lab results. Make sure that you are drinking plenty of water. Take your blood pressure pill if your BP gets over 140. Do not start Venetoclax until we see you back in 2 weeks. He will see you back in 2 weeks for labs and follow up.   Thank you for choosing [place] at [facility] to provide your oncology and hematology care.  To afford each patient quality time with our provider, please arrive at least 15 minutes before your scheduled appointment time.   If you have a lab appointment with the [place] please come in thru the  Main Entrance and check in at the main information desk  You need to re-schedule your appointment should you arrive 10 or more minutes late.  We strive to give you quality time with our providers, and arriving late affects you and other patients whose appointments are after yours.  Also, if you no show three or more times for appointments you may be dismissed from the clinic at the providers discretion.     Again, thank you for choosing [facility].  Our hope is that these requests will decrease the amount of time that you wait before being seen by our physicians.       _____________________________________________________________  Should you have questions after your visit to [facility], please contact our office at [phone] between the hours of 8:00 a.m. and 4:30 p.m.  Voicemails left after 4:00 p.m. will not be returned until the following business day.  For prescription refill requests, have your pharmacy contact our office and allow 72 hours.    Cancer Center Support Programs:   > Cancer Support Group  2nd Tuesday of the month 1pm-2pm, Journey Room

## 2019-06-09 NOTE — Progress Notes (Signed)
[place] [address], [place] [address]   CLINIC:  Medical Oncology/Hematology  PCP:  [name], MD [place] [address] [phone]   REASON FOR VISIT:  Follow-up for newly diagnosed AML.  CURRENT THERAPY: Decitabine and venetoclax  BRIEF ONCOLOGIC HISTORY:  Oncology History  MDS (myelodysplastic syndrome), high grade (HCC)  08/14/2018 Initial Diagnosis   MDS (myelodysplastic syndrome), high grade ([place])   08/22/2018 - 12/31/2018 Chemotherapy   The patient had palonosetron (ALOXI) injection 0.25 mg, 0.25 mg, Intravenous,  Once, 4 of 6 cycles Administration: 0.25 mg (08/22/2018), 0.25 mg (08/26/2018), 0.25 mg (08/28/2018), 0.25 mg (08/30/2018), 0.25 mg (10/01/2018), 0.25 mg (10/02/2018), 0.25 mg (11/04/2018), 0.25 mg (09/23/2018), 0.25 mg (09/25/2018), 0.25 mg (09/27/2018), 0.25 mg (10/28/2018), 0.25 mg (10/30/2018), 0.25 mg (11/01/2018), 0.25 mg (12/02/2018), 0.25 mg (12/04/2018), 0.25 mg (12/06/2018) azaCITIDine (VIDAZA) 100 mg in sodium chloride 0.9 % 50 mL chemo infusion, 110 mg (66.7 % of original dose 75 mg/m2), Intravenous, Once, 4 of 6 cycles Dose modification: 50 mg/m2 (original dose 75 mg/m2, Cycle 1, Reason: Provider Judgment), 50 mg/m2 (original dose 75 mg/m2, Cycle 2, Reason: Provider Judgment) Administration: 100 mg (08/22/2018), 100 mg (08/23/2018), 100 mg (08/26/2018), 100 mg (08/27/2018), 100 mg (08/28/2018), 100 mg (08/29/2018), 100 mg (08/30/2018), 100 mg (10/01/2018), 100 mg (10/02/2018), 100 mg (11/04/2018), 100 mg (11/05/2018), 100 mg (09/23/2018), 100 mg (09/24/2018), 100 mg (09/25/2018), 100 mg (09/26/2018), 100 mg (09/27/2018), 100 mg (10/28/2018), 100 mg (10/29/2018), 100 mg (10/30/2018), 100 mg (10/31/2018), 100 mg (11/01/2018), 100 mg (12/02/2018), 100 mg (12/03/2018), 100 mg (12/04/2018), 100 mg (12/05/2018), 100 mg (12/06/2018)  for chemotherapy treatment.    01/20/2019 -  Chemotherapy   The patient had decitabine (DACOGEN) 45 mg in sodium chloride 0.9 %  50 mL chemo infusion, 20 mg/m2 = 45 mg, Intravenous,  Once, 4 of 6 cycles Dose modification: 15 mg/m2 (original dose 20 mg/m2, Cycle 2, Reason: Other (see comments), Comment: neutropenic fever) Administration: 45 mg (01/20/2019), 45 mg (01/21/2019), 45 mg (01/22/2019), 45 mg (01/23/2019), 45 mg (01/24/2019), 35 mg (03/17/2019), 35 mg (03/18/2019), 35 mg (03/19/2019), 35 mg (03/20/2019), 35 mg (03/21/2019), 35 mg (04/22/2019), 35 mg (04/23/2019), 35 mg (04/24/2019), 35 mg (04/25/2019), 35 mg (05/26/2019), 35 mg (05/27/2019), 35 mg (05/28/2019), 35 mg (05/29/2019), 35 mg (05/30/2019) ondansetron (ZOFRAN) 8 mg in sodium chloride 0.9 % 50 mL IVPB, 8 mg (100 % of original dose 8 mg), Intravenous,  Once, 4 of 6 cycles Dose modification: 8 mg (original dose 8 mg, Cycle 1) Administration: 8 mg (01/20/2019), 8 mg (01/21/2019), 8 mg (01/22/2019), 8 mg (01/23/2019), 8 mg (01/24/2019), 8 mg (03/17/2019), 8 mg (03/18/2019), 8 mg (03/19/2019), 8 mg (03/20/2019), 8 mg (03/21/2019), 8 mg (04/22/2019), 8 mg (04/23/2019), 8 mg (04/24/2019), 8 mg (04/25/2019), 8 mg (05/26/2019), 8 mg (05/27/2019), 8 mg (05/28/2019), 8 mg (05/29/2019), 8 mg (05/30/2019)  for chemotherapy treatment.    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[place])  01/07/2019 Initial Diagnosis   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[place])   01/20/2019 -  Chemotherapy   The patient had decitabine (DACOGEN) 45 mg in sodium chloride 0.9 % 50 mL chemo infusion, 20 mg/m2 = 45 mg, Intravenous,  Once, 4 of 6 cycles Dose modification: 15 mg/m2 (original dose 20 mg/m2, Cycle 2, Reason: Other (see comments), Comment: neutropenic fever) Administration: 45 mg (01/20/2019), 45 mg (01/21/2019), 45 mg (01/22/2019), 45 mg (01/23/2019), 45 mg (01/24/2019), 35 mg (03/17/2019), 35 mg (03/18/2019), 35 mg (03/19/2019), 35 mg (03/20/2019), 35 mg (03/21/2019),  35 mg (04/22/2019), 35 mg (04/23/2019), 35 mg (04/24/2019), 35 mg (04/25/2019), 35 mg (05/26/2019), 35 mg (05/27/2019), 35 mg (05/28/2019), 35 mg (05/29/2019), 35  mg (05/30/2019) ondansetron (ZOFRAN) 8 mg in sodium chloride 0.9 % 50 mL IVPB, 8 mg (100 % of original dose 8 mg), Intravenous,  Once, 4 of 6 cycles Dose modification: 8 mg (original dose 8 mg, Cycle 1) Administration: 8 mg (01/20/2019), 8 mg (01/21/2019), 8 mg (01/22/2019), 8 mg (01/23/2019), 8 mg (01/24/2019), 8 mg (03/17/2019), 8 mg (03/18/2019), 8 mg (03/19/2019), 8 mg (03/20/2019), 8 mg (03/21/2019), 8 mg (04/22/2019), 8 mg (04/23/2019), 8 mg (04/24/2019), 8 mg (04/25/2019), 8 mg (05/26/2019), 8 mg (05/27/2019), 8 mg (05/28/2019), 8 mg (05/29/2019), 8 mg (05/30/2019)  for chemotherapy treatment.       CANCER STAGING: Cancer Staging No matching staging information was found for the patient.   INTERVAL HISTORY:  [name] 74 y.o. male seen for follow-up and toxicity assessment while on treatment for AML.  He stopped taking venetoclax after last dose last night as he ran out of pills.  Appetite is 100%.  Energy levels are 50%.  Denies any nausea, vomiting or diarrhea.  Arthralgias in the knees are stable.  He reports that occasional systolic blood pressure is more than 150 at home.  REVIEW OF SYSTEMS:  Review of Systems  Cardiovascular: Positive for leg swelling.  Musculoskeletal: Positive for arthralgias.  All other systems reviewed and are negative.    PAST MEDICAL/SURGICAL HISTORY:  Past Medical History:  Diagnosis Date  . Arthritis   . 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 Cancer ([place]) 01-28-2014   skin cancer  . CKD (chronic kidney disease), stage III   . GERD (gastroesophageal reflux disease)   . Heart murmur    NOTED DURING PHYSICAL WHEN HE WAS ENLISTING IN MILITARY , DIDNT KNOW UNTIL THAT TIME AND REPORTS , "THATS THE LAST I HEARD ABOUT IT "   . History of hypertension    no longer issue  . History of kidney stones   . History of malignant melanoma of skin    excision top of scalp 2015-- no recurrence  . History of urinary retention    post op lumbar fusion  surgery 04/ 2016  . Hypertension   . 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 Left ureteral calculus   . Seasonal allergies   . Wears glasses   . Wears glasses   . Wears partial dentures    upper and lower   Past Surgical History:  Procedure Laterality Date  . ANKLE FUSION Right 2007  . CARPAL TUNNEL RELEASE Left 12/28/2009   w/ pulley release left long finger  . CARPAL TUNNEL RELEASE Right 07/22/2013   Procedure: RIGHT CARPAL TUNNEL RELEASE;  Surgeon: Cammie Sickle., MD;  Location: [place];  Service: Orthopedics;  Laterality: Right;  . COLONOSCOPY    . CYSTO/  LEFT RETROGRADE PYELOGRAM  11/21/2010  . CYSTOSCOPY WITH STENT PLACEMENT Left 03/09/2016   Procedure: CYSTOSCOPY WITH STENT PLACEMENT;  Surgeon: Franchot Gallo, MD;  Location: [facility];  Service: Urology;  Laterality: Left;  . CYSTOSCOPY/RETROGRADE/URETEROSCOPY/STONE EXTRACTION WITH BASKET Left 03/09/2016   Procedure: CYSTOSCOPY/RETROGRADE/URETEROSCOPY/STONE EXTRACTION WITH BASKET;  Surgeon: Franchot Gallo, MD;  Location[facility];  Service: Urology;  Laterality: Left;  . LEFT URETEROSCOPIC LASER LITHOTRIPSY STONE EXTRACTION/ STENT PLACEMENT  05/23/2010  . MOHS  SURGERY     TOP OF THE HEAD   . ORIF ANKLE FRACTURE Right 1978  . PORT-A-CATH REMOVAL Right 02/14/2019   Procedure: MINOR REMOVAL PORT-A-CATH;  Surgeon: Aviva Signs, MD;  Location: [facility];  Service: General;  Laterality: Right;  . PORTACATH PLACEMENT Right 08/19/2018   Procedure: INSERTION PORT-A-CATH (attached catheter in right subclavian);  Surgeon: Aviva Signs, MD;  Location: [facility];  Service: General;  Laterality: Right;  . PORTACATH PLACEMENT Left 05/16/2019   Procedure: INSERTION PORT-A-CATH (attached catheter in left subclavian);  Surgeon: Aviva Signs, MD;  Location: [facility];  Service: General;  Laterality: Left;  . POSTERIOR LUMBAR  FUSION  08/21/2014   laminectomy and decompression L2 -- L5  . RIGHT LOWER LEG SURGERY  X3  1975 to 1976   including ORIF  . TONSILLECTOMY AND ADENOIDECTOMY  1986  . UMBILICAL HERNIA REPAIR  2009 approx     SOCIAL HISTORY:  Social History   Socioeconomic History  . Marital status: Married    Spouse name: Not on file  . Number of children: Not on file  . Years of education: Not on file  . Highest education level: Not on file  Occupational History  . Not on file  Tobacco Use  . Smoking status: Former Smoker    Years: 20.00    Types: Cigarettes    Quit date: 07/17/1986    Years since quitting: 32.9  . Smokeless tobacco: Never Used  Substance and Sexual Activity  . Alcohol use: Yes    Alcohol/week: 7.0 - 14.0 standard drinks    Types: 7 - 14 Cans of beer per week    Comment: 1 -2 beer daily  . Drug use: No  . Sexual activity: Not Currently  Other Topics Concern  . Not on file  Social History Narrative  . Not on file   Social Determinants of Health   Financial Resource Strain: Unknown  . Difficulty of Paying Living Expenses: Patient refused  Food Insecurity: Unknown  . Worried About Charity fundraiser in the Last Year: Patient refused  . Ran Out of Food in the Last Year: Patient refused  Transportation Needs: Unknown  . Lack of Transportation (Medical): Patient refused  . Lack of Transportation (Non-Medical): Patient refused  Physical Activity: Unknown  . Days of Exercise per Week: Patient refused  . Minutes of Exercise per Session: Patient refused  Stress: Unknown  . Feeling of Stress : Patient refused  Social Connections: Unknown  . Frequency of Communication with Friends and Family: Patient refused  . Frequency of Social Gatherings with Friends and Family: Patient refused  . Attends Religious Services: Patient refused  . Active Member of Clubs or Organizations: Patient refused  . Attends Archivist Meetings: Patient refused  . Marital Status:  Patient refused  Intimate Partner Violence: Unknown  . Fear of Current or Ex-Partner: Patient refused  . Emotionally Abused: Patient refused  . Physically Abused: Patient refused  . Sexually Abused: Patient refused    FAMILY HISTORY:  Family History  Problem Relation Age of Onset  . Stroke Mother   . Prostate cancer Father   . Bone cancer Father   . Diverticulitis Father   . Rheum arthritis Sister   . Urinary tract infection Sister   . Colon cancer Neg Hx     CURRENT MEDICATIONS:  Outpatient Encounter Medications as of 06/09/2019  Medication Sig  . acetaminophen (TYLENOL) 500 MG tablet Take 1,000 mg by mouth 3 (three) times daily.   Marland Kitchen  allopurinol (ZYLOPRIM) 300 MG tablet Take 300 mg by mouth at bedtime.   . cetirizine (ZYRTEC) 10 MG tablet Take 10 mg by mouth daily. IN THE MORNING  . diclofenac sodium (VOLTAREN) 1 % GEL Apply 1 g topically 3 (three) times daily as needed (knee pain.).   Marland Kitchen docusate sodium (COLACE) 100 MG capsule Take 100 mg by mouth at bedtime.   . lansoprazole (PREVACID) 15 MG capsule Take 15 mg by mouth daily.   . magnesium oxide (MAG-OX) 400 MG tablet Take by mouth.  . tamsulosin (FLOMAX) 0.4 MG CAPS capsule Take 0.4 mg by mouth every evening.   . traMADol (ULTRAM) 50 MG tablet TAKE 1 TABLET BY MOUTH THREE TIMES DAILY AS NEEDED FOR PAIN  . venetoclax 100 MG TABS Take 200 mg by mouth daily.  . [DISCONTINUED] [employer] TRIPLE MAGNESIUM COMPLEX PO Take 1 capsule by mouth at bedtime. With Vitamin d3  . hydrochlorothiazide (HYDRODIURIL) 12.5 MG tablet Take 12.5 mg by mouth daily.   Marland Kitchen lidocaine-prilocaine (EMLA) cream Apply 1 application topically See admin instructions. ONE HOUR PRIOR TO CHEMOTHERAPY APPOINTMENT  . [DISCONTINUED] lisinopril (ZESTRIL) 2.5 MG tablet Take 2.5 mg by mouth daily.   No facility-administered encounter medications on file as of 06/09/2019.    ALLERGIES:  No Known Allergies   PHYSICAL EXAM:  ECOG Performance status: 1  Vitals:   06/09/19  0833  BP: (!) 146/84  Pulse: 84  Resp: 18  Temp: 97.7 F (36.5 C)  SpO2: 95%   Filed Weights   06/09/19 0833  Weight: 222 lb 4.8 oz (100.8 kg)    Physical Exam Constitutional:      Appearance: Normal appearance.  HENT:     Mouth/Throat:     Mouth: Mucous membranes are moist.     Pharynx: Oropharynx is clear.  Eyes:     Extraocular Movements: Extraocular movements intact.     Conjunctiva/sclera: Conjunctivae normal.  Cardiovascular:     Rate and Rhythm: Normal rate and regular rhythm.     Heart sounds: Normal heart sounds.  Pulmonary:     Effort: Pulmonary effort is normal.     Breath sounds: Normal breath sounds.  Abdominal:     General: There is no distension.     Palpations: Abdomen is soft. There is no mass.  Musculoskeletal:        General: No deformity. Normal range of motion.     Cervical back: Normal range of motion.  Skin:    General: Skin is warm.  Neurological:     General: No focal deficit present.     Mental Status: He is alert and oriented to person, place, and time.  Psychiatric:        Mood and Affect: Mood normal.        Behavior: Behavior normal.      LABORATORY DATA:  I have reviewed the labs as listed.  CBC    Component Value Date/Time   WBC 2.1 (L) 06/09/2019 0820   RBC 4.67 06/09/2019 0820   HGB 14.6 06/09/2019 0820   HCT 45.3 06/09/2019 0820   PLT 95 (L) 06/09/2019 0820   MCV 97.0 06/09/2019 0820   MCH 31.3 06/09/2019 0820   MCHC 32.2 06/09/2019 0820   RDW 16.0 (H) 06/09/2019 0820   LYMPHSABS 0.9 06/09/2019 0820   MONOABS 0.2 06/09/2019 0820   EOSABS 0.0 06/09/2019 0820   BASOSABS 0.0 06/09/2019 0820   CMP Latest Ref Rng & Units 06/09/2019 06/02/2019 05/26/2019  Glucose 70 - 99  mg/dL 98 98 110(H)  BUN 8 - 23 mg/dL 28(H) 39(H) 35(H)  Creatinine 0.61 - 1.24 mg/dL 1.08 1.54(H) 1.26(H)  Sodium 135 - 145 mmol/L 143 139 138  Potassium 3.5 - 5.1 mmol/L 4.1 4.6 4.2  Chloride 98 - 111 mmol/L 109 102 106  CO2 22 - 32 mmol/L 26 27 24     Calcium 8.9 - 10.3 mg/dL 9.6 9.7 9.5  Total Protein 6.5 - 8.1 g/dL 6.5 6.4(L) 6.5  Total Bilirubin 0.3 - 1.2 mg/dL 0.8 0.8 0.6  Alkaline Phos 38 - 126 U/L 61 63 63  AST 15 - 41 U/L 22 22 26   ALT 0 - 44 U/L 32 31 36        ASSESSMENT & PLAN:   No problem-specific Assessment & Plan notes found for this encounter.    Orders placed this encounter:  No orders of the defined types were placed in this encounter.     [name], MD [place] [phone]

## 2019-06-09 NOTE — Assessment & Plan Note (Signed)
1.  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  Flow cytometry-12% blasts.  Chromosome analysis and FISH panel normal. -4 cycles of Vidaza from 08/22/2018 through 12/02/2018. -Repeat bone marrow biopsy on 12/23/2018 at [facility] showed 25% blasts and 90% cellular marrow, consistent with AML with MDS related changes.  Flow cytometry showed 11% blasts.  MDS FISH panel was normal. -NGS panel did not show any targetable mutations. -Cycle 1 of decitabine (20 mg/m2) with venetoclax 300 mg daily on 01/20/2019. -He was hospitalized from 02/09/2019 through 02/18/2019 with neutropenic fever.  His venetoclax was held at that time. -His blood cultures grew Streptococcus Infantarius.  His Port-A-Cath was removed on 02/14/2019.  He also had lung infiltrates with cough which resolved.  He had a PICC line placed in the left arm. -Bone marrow biopsy on 03/05/2019 did not show any evidence of leukemia. -Cycle 2 of decitabine (15 mg/M 2) on 03/17/2019.  Venetoclax was dose reduced to 200 mg, took for 2 weeks until 03/31/2019. -Cycle 3 chemotherapy on 04/22/2019 for 4 days of decitabine 15 mg/m2. -Cycle 4 of decitabine on 05/26/2019.  Venetoclax 200 mg daily started on 05/26/2019. -He ran out of venetoclax on 06/08/2019.  He denies any GI side effects. -We reviewed his labs.  White count is 2.1 with ANC of 900.  Platelet count is 95.  No transfusions needed. -He will be off of venetoclax for the next 2 weeks.  I will see him back in 2 weeks for follow-up and initiate next cycle of chemotherapy.  In the interim, he will get venetoclax refill from pharmacy.   2.  CKD: -As his creatinine was high at 1.5, we have discontinued lisinopril at last visit. -Today his creatinine improved to 1.08. -His blood pressure is apparently more than 150 at home on occasion.  I have suggested him to take lisinopril as needed if systolic blood pressure is more than 140 at home.  3.  Bilateral knee pains: -He will  continue tramadol 3 times a day as needed along with Tylenol.  4.  Hypomagnesemia: -He will continue magnesium once daily.  Magnesium is normal today.

## 2019-06-10 ENCOUNTER — Encounter (HOSPITAL_COMMUNITY): Payer: Self-pay | Admitting: *Deleted

## 2019-06-14 ENCOUNTER — Other Ambulatory Visit (HOSPITAL_COMMUNITY): Payer: Self-pay | Admitting: Nurse Practitioner

## 2019-06-14 DIAGNOSIS — M25559 Pain in unspecified hip: Secondary | ICD-10-CM

## 2019-06-18 ENCOUNTER — Telehealth (HOSPITAL_COMMUNITY): Payer: Self-pay | Admitting: Pharmacy Technician

## 2019-06-18 NOTE — Telephone Encounter (Signed)
Oral Oncology Patient Advocate Encounter   Was successful in securing patient an $4,600 grant from Patient [facility]) to provide copayment coverage for Venclexta.  This will keep the out of pocket expense at $0.     I have spoken with the patient.    The billing information is as follows and has been shared with [place].   Member ID: [id number] Group ID: [id number] RxBin: [id number] Dates of Eligibility: 03/20/2019 through 06/16/2020  Fund:  Acute Myeloid [place] Patient [place] Phone [phone] Fax [phone] 06/18/2019 1:27 PM

## 2019-06-23 ENCOUNTER — Encounter (HOSPITAL_COMMUNITY): Payer: Self-pay | Admitting: Hematology

## 2019-06-23 ENCOUNTER — Inpatient Hospital Stay (HOSPITAL_COMMUNITY): Payer: Medicare Other

## 2019-06-23 ENCOUNTER — Other Ambulatory Visit: Payer: Self-pay

## 2019-06-23 ENCOUNTER — Inpatient Hospital Stay (HOSPITAL_BASED_OUTPATIENT_CLINIC_OR_DEPARTMENT_OTHER): Payer: Medicare Other | Admitting: Hematology

## 2019-06-23 DIAGNOSIS — C9200 Acute myeloblastic leukemia, not having achieved remission: Secondary | ICD-10-CM

## 2019-06-23 LAB — COMPREHENSIVE METABOLIC PANEL
ALT: 32 U/L (ref 0–44)
AST: 21 U/L (ref 15–41)
Albumin: 3.6 g/dL (ref 3.5–5.0)
Alkaline Phosphatase: 56 U/L (ref 38–126)
Anion gap: 9 (ref 5–15)
BUN: 22 mg/dL (ref 8–23)
CO2: 24 mmol/L (ref 22–32)
Calcium: 9.3 mg/dL (ref 8.9–10.3)
Chloride: 105 mmol/L (ref 98–111)
Creatinine, Ser: 1.22 mg/dL (ref 0.61–1.24)
GFR calc Af Amer: >60 mL/min (ref >60)
GFR calc non Af Amer: 58 mL/min — ABNORMAL LOW (ref >60)
Glucose, Bld: 117 mg/dL — ABNORMAL HIGH (ref 70–99)
Potassium: 3.8 mmol/L (ref 3.5–5.1)
Sodium: 138 mmol/L (ref 135–145)
Total Bilirubin: 0.7 mg/dL (ref 0.3–1.2)
Total Protein: 6.3 g/dL — ABNORMAL LOW (ref 6.5–8.1)

## 2019-06-23 LAB — CBC WITH DIFFERENTIAL/PLATELET
Abs Immature Granulocytes: 0 10*3/uL (ref 0.00–0.07)
Basophils Absolute: 0 10*3/uL (ref 0.0–0.1)
Basophils Relative: 2 %
Eosinophils Absolute: 0 10*3/uL (ref 0.0–0.5)
Eosinophils Relative: 1 %
HCT: 42.9 % (ref 39.0–52.0)
Hemoglobin: 14.2 g/dL (ref 13.0–17.0)
Immature Granulocytes: 0 %
Lymphocytes Relative: 75 %
Lymphs Abs: 0.8 10*3/uL (ref 0.7–4.0)
MCH: 32.3 pg (ref 26.0–34.0)
MCHC: 33.1 g/dL (ref 30.0–36.0)
MCV: 97.7 fL (ref 80.0–100.0)
Monocytes Absolute: 0.1 10*3/uL (ref 0.1–1.0)
Monocytes Relative: 7 %
Neutro Abs: 0.2 10*3/uL — ABNORMAL LOW (ref 1.7–7.7)
Neutrophils Relative %: 15 %
Platelets: 192 10*3/uL (ref 150–400)
RBC: 4.39 MIL/uL (ref 4.22–5.81)
RDW: 17.2 % — ABNORMAL HIGH (ref 11.5–15.5)
WBC: 1.1 10*3/uL — CL (ref 4.0–10.5)
nRBC: 0 % (ref 0.0–0.2)

## 2019-06-23 LAB — LACTATE DEHYDROGENASE: LDH: 126 U/L (ref 98–192)

## 2019-06-23 LAB — MAGNESIUM: Magnesium: 1.7 mg/dL (ref 1.7–2.4)

## 2019-06-23 LAB — URIC ACID: Uric Acid, Serum: 4.5 mg/dL (ref 3.7–8.6)

## 2019-06-23 LAB — PHOSPHORUS: Phosphorus: 2.9 mg/dL (ref 2.5–4.6)

## 2019-06-23 MED ORDER — HEPARIN SOD (PORK) LOCK FLUSH 100 UNIT/ML IV SOLN
500.0000 [IU] | Freq: Once | INTRAVENOUS | Status: AC
  Administered 2019-06-23: 500 [IU] via INTRAVENOUS

## 2019-06-23 MED ORDER — SODIUM CHLORIDE 0.9% FLUSH
20.0000 mL | INTRAVENOUS | Status: DC | PRN
  Administered 2019-06-23: 20 mL via INTRAVENOUS

## 2019-06-23 MED ORDER — AMLODIPINE BESYLATE 2.5 MG PO TABS: 2.5000 mg | ORAL_TABLET | Freq: Every day | ORAL | 1 refills | Status: DC

## 2019-06-23 NOTE — Progress Notes (Signed)
Patient has been assessed, vital signs and labs have been reviewed by Dr. Delton Coombes. ANC, Creatinine, LFTs, and Platelets are within treatment parameters, WBC low today please HOLD Tx per Dr. Delton Coombes.

## 2019-06-23 NOTE — Progress Notes (Signed)
[facility] [address], [place] [address]   CLINIC:  Medical Oncology/Hematology  PCP:  [name], MD [place] [address] [phone]   REASON FOR VISIT:  Follow-up for newly diagnosed AML.  CURRENT THERAPY: Decitabine and venetoclax  BRIEF ONCOLOGIC HISTORY:  Oncology History  MDS (myelodysplastic syndrome), high grade (HCC)  08/14/2018 Initial Diagnosis   MDS (myelodysplastic syndrome), high grade ([place])   08/22/2018 - 12/31/2018 Chemotherapy   The patient had palonosetron (ALOXI) injection 0.25 mg, 0.25 mg, Intravenous,  Once, 4 of 6 cycles Administration: 0.25 mg (08/22/2018), 0.25 mg (08/26/2018), 0.25 mg (08/28/2018), 0.25 mg (08/30/2018), 0.25 mg (10/01/2018), 0.25 mg (10/02/2018), 0.25 mg (11/04/2018), 0.25 mg (09/23/2018), 0.25 mg (09/25/2018), 0.25 mg (09/27/2018), 0.25 mg (10/28/2018), 0.25 mg (10/30/2018), 0.25 mg (11/01/2018), 0.25 mg (12/02/2018), 0.25 mg (12/04/2018), 0.25 mg (12/06/2018) azaCITIDine (VIDAZA) 100 mg in sodium chloride 0.9 % 50 mL chemo infusion, 110 mg (66.7 % of original dose 75 mg/m2), Intravenous, Once, 4 of 6 cycles Dose modification: 50 mg/m2 (original dose 75 mg/m2, Cycle 1, Reason: Provider Judgment), 50 mg/m2 (original dose 75 mg/m2, Cycle 2, Reason: Provider Judgment) Administration: 100 mg (08/22/2018), 100 mg (08/23/2018), 100 mg (08/26/2018), 100 mg (08/27/2018), 100 mg (08/28/2018), 100 mg (08/29/2018), 100 mg (08/30/2018), 100 mg (10/01/2018), 100 mg (10/02/2018), 100 mg (11/04/2018), 100 mg (11/05/2018), 100 mg (09/23/2018), 100 mg (09/24/2018), 100 mg (09/25/2018), 100 mg (09/26/2018), 100 mg (09/27/2018), 100 mg (10/28/2018), 100 mg (10/29/2018), 100 mg (10/30/2018), 100 mg (10/31/2018), 100 mg (11/01/2018), 100 mg (12/02/2018), 100 mg (12/03/2018), 100 mg (12/04/2018), 100 mg (12/05/2018), 100 mg (12/06/2018)  for chemotherapy treatment.    01/20/2019 -  Chemotherapy   The patient had decitabine (DACOGEN) 45 mg in sodium chloride 0.9 %  50 mL chemo infusion, 20 mg/m2 = 45 mg, Intravenous,  Once, 4 of 6 cycles Dose modification: 15 mg/m2 (original dose 20 mg/m2, Cycle 2, Reason: Other (see comments), Comment: neutropenic fever) Administration: 45 mg (01/20/2019), 45 mg (01/21/2019), 45 mg (01/22/2019), 45 mg (01/23/2019), 45 mg (01/24/2019), 35 mg (03/17/2019), 35 mg (03/18/2019), 35 mg (03/19/2019), 35 mg (03/20/2019), 35 mg (03/21/2019), 35 mg (04/22/2019), 35 mg (04/23/2019), 35 mg (04/24/2019), 35 mg (04/25/2019), 35 mg (05/26/2019), 35 mg (05/27/2019), 35 mg (05/28/2019), 35 mg (05/29/2019), 35 mg (05/30/2019) ondansetron (ZOFRAN) 8 mg in sodium chloride 0.9 % 50 mL IVPB, 8 mg (100 % of original dose 8 mg), Intravenous,  Once, 4 of 6 cycles Dose modification: 8 mg (original dose 8 mg, Cycle 1) Administration: 8 mg (01/20/2019), 8 mg (01/21/2019), 8 mg (01/22/2019), 8 mg (01/23/2019), 8 mg (01/24/2019), 8 mg (03/17/2019), 8 mg (03/18/2019), 8 mg (03/19/2019), 8 mg (03/20/2019), 8 mg (03/21/2019), 8 mg (04/22/2019), 8 mg (04/23/2019), 8 mg (04/24/2019), 8 mg (04/25/2019), 8 mg (05/26/2019), 8 mg (05/27/2019), 8 mg (05/28/2019), 8 mg (05/29/2019), 8 mg (05/30/2019)  for chemotherapy treatment.    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[place])  01/07/2019 Initial Diagnosis   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[place])   01/20/2019 -  Chemotherapy   The patient had decitabine (DACOGEN) 45 mg in sodium chloride 0.9 % 50 mL chemo infusion, 20 mg/m2 = 45 mg, Intravenous,  Once, 4 of 6 cycles Dose modification: 15 mg/m2 (original dose 20 mg/m2, Cycle 2, Reason: Other (see comments), Comment: neutropenic fever) Administration: 45 mg (01/20/2019), 45 mg (01/21/2019), 45 mg (01/22/2019), 45 mg (01/23/2019), 45 mg (01/24/2019), 35 mg (03/17/2019), 35 mg (03/18/2019), 35 mg (03/19/2019), 35 mg (03/20/2019), 35 mg (03/21/2019),  35 mg (04/22/2019), 35 mg (04/23/2019), 35 mg (04/24/2019), 35 mg (04/25/2019), 35 mg (05/26/2019), 35 mg (05/27/2019), 35 mg (05/28/2019), 35 mg (05/29/2019), 35  mg (05/30/2019) ondansetron (ZOFRAN) 8 mg in sodium chloride 0.9 % 50 mL IVPB, 8 mg (100 % of original dose 8 mg), Intravenous,  Once, 4 of 6 cycles Dose modification: 8 mg (original dose 8 mg, Cycle 1) Administration: 8 mg (01/20/2019), 8 mg (01/21/2019), 8 mg (01/22/2019), 8 mg (01/23/2019), 8 mg (01/24/2019), 8 mg (03/17/2019), 8 mg (03/18/2019), 8 mg (03/19/2019), 8 mg (03/20/2019), 8 mg (03/21/2019), 8 mg (04/22/2019), 8 mg (04/23/2019), 8 mg (04/24/2019), 8 mg (04/25/2019), 8 mg (05/26/2019), 8 mg (05/27/2019), 8 mg (05/28/2019), 8 mg (05/29/2019), 8 mg (05/30/2019)  for chemotherapy treatment.       CANCER STAGING: Cancer Staging No matching staging information was found for the patient.   INTERVAL HISTORY:  [name] 74 y.o. male seen for follow-up and toxicity assessment prior to next cycle of decitabine.  Appetite is 100%.  Energy levels are 50%.  Denies any bruising or bleeding.  Reports that his blood pressure is staying high at home occasionally.  It goes up to 211 systolic.  Denies any headaches or vision changes.  He is continue magnesium once daily.  REVIEW OF SYSTEMS:  Review of Systems  Cardiovascular: Positive for leg swelling.  Musculoskeletal: Positive for arthralgias.  All other systems reviewed and are negative.    PAST MEDICAL/SURGICAL HISTORY:  Past Medical History:  Diagnosis Date  . Arthritis   . 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 Cancer ([place]) 01-28-2014   skin cancer  . CKD (chronic kidney disease), stage III   . GERD (gastroesophageal reflux disease)   . Heart murmur    NOTED DURING PHYSICAL WHEN HE WAS ENLISTING IN [facility] , DIDNT KNOW UNTIL THAT TIME AND REPORTS , "THATS THE LAST I HEARD ABOUT IT "   . History of hypertension    no longer issue  . History of kidney stones   . History of malignant melanoma of skin    excision top of scalp 2015-- no recurrence  . History of urinary retention    post op lumbar fusion surgery 04/ 2016  .  Hypertension   . 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 Left ureteral calculus   . Seasonal allergies   . Wears glasses   . Wears glasses   . Wears partial dentures    upper and lower   Past Surgical History:  Procedure Laterality Date  . ANKLE FUSION Right 2007  . CARPAL TUNNEL RELEASE Left 12/28/2009   w/ pulley release left long finger  . CARPAL TUNNEL RELEASE Right 07/22/2013   Procedure: RIGHT CARPAL TUNNEL RELEASE;  Surgeon: Cammie Sickle., MD;  Location: [facility];  Service: Orthopedics;  Laterality: Right;  . COLONOSCOPY    . CYSTO/  LEFT RETROGRADE PYELOGRAM  11/21/2010  . CYSTOSCOPY WITH STENT PLACEMENT Left 03/09/2016   Procedure: CYSTOSCOPY WITH STENT PLACEMENT;  Surgeon: Franchot Gallo, MD;  Location[facility];  Service: Urology;  Laterality: Left;  . CYSTOSCOPY/RETROGRADE/URETEROSCOPY/STONE EXTRACTION WITH BASKET Left 03/09/2016   Procedure: CYSTOSCOPY/RETROGRADE/URETEROSCOPY/STONE EXTRACTION WITH BASKET;  Surgeon: Franchot Gallo, MD;  Location: [facility];  Service: Urology;  Laterality: Left;  . LEFT URETEROSCOPIC LASER LITHOTRIPSY STONE EXTRACTION/ STENT PLACEMENT  05/23/2010  . MOHS SURGERY  TOP OF THE HEAD   . ORIF ANKLE FRACTURE Right 1978  . PORT-A-CATH REMOVAL Right 02/14/2019   Procedure: MINOR REMOVAL PORT-A-CATH;  Surgeon: Aviva Signs, MD;  Location: [facility];  Service: General;  Laterality: Right;  . PORTACATH PLACEMENT Right 08/19/2018   Procedure: INSERTION PORT-A-CATH (attached catheter in right subclavian);  Surgeon: Aviva Signs, MD;  Location: [facility];  Service: General;  Laterality: Right;  . PORTACATH PLACEMENT Left 05/16/2019   Procedure: INSERTION PORT-A-CATH (attached catheter in left subclavian);  Surgeon: Aviva Signs, MD;  Location: [facility];  Service: General;  Laterality: Left;  . POSTERIOR LUMBAR FUSION  08/21/2014    laminectomy and decompression L2 -- L5  . RIGHT LOWER LEG SURGERY  X3  1975 to 1976   including ORIF  . TONSILLECTOMY AND ADENOIDECTOMY  1986  . UMBILICAL HERNIA REPAIR  2009 approx     SOCIAL HISTORY:  Social History   Socioeconomic History  . Marital status: Married    Spouse name: Not on file  . Number of children: Not on file  . Years of education: Not on file  . Highest education level: Not on file  Occupational History  . Not on file  Tobacco Use  . Smoking status: Former Smoker    Years: 20.00    Types: Cigarettes    Quit date: 07/17/1986    Years since quitting: 32.9  . Smokeless tobacco: Never Used  Substance and Sexual Activity  . Alcohol use: Yes    Alcohol/week: 7.0 - 14.0 standard drinks    Types: 7 - 14 Cans of beer per week    Comment: 1 -2 beer daily  . Drug use: No  . Sexual activity: Not Currently  Other Topics Concern  . Not on file  Social History Narrative  . Not on file   Social Determinants of Health   Financial Resource Strain: Unknown  . Difficulty of Paying Living Expenses: Patient refused  Food Insecurity: Unknown  . Worried About Charity fundraiser in the Last Year: Patient refused  . Ran Out of Food in the Last Year: Patient refused  Transportation Needs: Unknown  . Lack of Transportation (Medical): Patient refused  . Lack of Transportation (Non-Medical): Patient refused  Physical Activity: Unknown  . Days of Exercise per Week: Patient refused  . Minutes of Exercise per Session: Patient refused  Stress: Unknown  . Feeling of Stress : Patient refused  Social Connections: Unknown  . Frequency of Communication with Friends and Family: Patient refused  . Frequency of Social Gatherings with Friends and Family: Patient refused  . Attends Religious Services: Patient refused  . Active Member of Clubs or Organizations: Patient refused  . Attends Archivist Meetings: Patient refused  . Marital Status: Patient refused  Intimate  Partner Violence: Unknown  . Fear of Current or Ex-Partner: Patient refused  . Emotionally Abused: Patient refused  . Physically Abused: Patient refused  . Sexually Abused: Patient refused    FAMILY HISTORY:  Family History  Problem Relation Age of Onset  . Stroke Mother   . Prostate cancer Father   . Bone cancer Father   . Diverticulitis Father   . Rheum arthritis Sister   . Urinary tract infection Sister   . Colon cancer Neg Hx     CURRENT MEDICATIONS:  Outpatient Encounter Medications as of 06/23/2019  Medication Sig  . acetaminophen (TYLENOL) 500 MG tablet Take 1,000 mg by mouth 3 (three) times daily.   Marland Kitchen allopurinol (ZYLOPRIM)  300 MG tablet Take 300 mg by mouth at bedtime.   . cetirizine (ZYRTEC) 10 MG tablet Take 10 mg by mouth daily. IN THE MORNING  . docusate sodium (COLACE) 100 MG capsule Take 100 mg by mouth at bedtime.   . hydrochlorothiazide (HYDRODIURIL) 12.5 MG tablet Take 12.5 mg by mouth daily.   . lansoprazole (PREVACID) 15 MG capsule Take 15 mg by mouth daily.   . magnesium oxide (MAG-OX) 400 MG tablet Take by mouth.  . tamsulosin (FLOMAX) 0.4 MG CAPS capsule Take 0.4 mg by mouth every evening.   . venetoclax 100 MG TABS Take 200 mg by mouth daily.  Marland Kitchen amLODipine (NORVASC) 2.5 MG tablet Take 1 tablet (2.5 mg total) by mouth daily.  . diclofenac sodium (VOLTAREN) 1 % GEL Apply 1 g topically 3 (three) times daily as needed (knee pain.).   Marland Kitchen lidocaine-prilocaine (EMLA) cream Apply 1 application topically See admin instructions. ONE HOUR PRIOR TO CHEMOTHERAPY APPOINTMENT  . traMADol (ULTRAM) 50 MG tablet TAKE 1 TABLET BY MOUTH THREE TIMES DAILY AS NEEDED FOR PAIN (Patient not taking: Reported on 06/23/2019)   No facility-administered encounter medications on file as of 06/23/2019.    ALLERGIES:  No Known Allergies   PHYSICAL EXAM:  ECOG Performance status: 1  Vitals:   06/23/19 0941  BP: 133/77  Pulse: 85  Resp: 18  Temp: (!) 97.5 F (36.4 C)  SpO2: 96%     Filed Weights   06/23/19 0941  Weight: 224 lb 3.2 oz (101.7 kg)    Physical Exam Constitutional:      Appearance: Normal appearance.  HENT:     Mouth/Throat:     Mouth: Mucous membranes are moist.     Pharynx: Oropharynx is clear.  Eyes:     Extraocular Movements: Extraocular movements intact.     Conjunctiva/sclera: Conjunctivae normal.  Cardiovascular:     Rate and Rhythm: Normal rate and regular rhythm.     Heart sounds: Normal heart sounds.  Pulmonary:     Effort: Pulmonary effort is normal.     Breath sounds: Normal breath sounds.  Abdominal:     General: There is no distension.     Palpations: Abdomen is soft. There is no mass.  Musculoskeletal:        General: No deformity. Normal range of motion.     Cervical back: Normal range of motion.  Skin:    General: Skin is warm.  Neurological:     General: No focal deficit present.     Mental Status: He is alert and oriented to person, place, and time.  Psychiatric:        Mood and Affect: Mood normal.        Behavior: Behavior normal.      LABORATORY DATA:  I have reviewed the labs as listed.  CBC    Component Value Date/Time   WBC 1.1 (LL) 06/23/2019 1019   RBC 4.39 06/23/2019 1019   HGB 14.2 06/23/2019 1019   HCT 42.9 06/23/2019 1019   PLT 192 06/23/2019 1019   MCV 97.7 06/23/2019 1019   MCH 32.3 06/23/2019 1019   MCHC 33.1 06/23/2019 1019   RDW 17.2 (H) 06/23/2019 1019   LYMPHSABS 0.8 06/23/2019 1019   MONOABS 0.1 06/23/2019 1019   EOSABS 0.0 06/23/2019 1019   BASOSABS 0.0 06/23/2019 1019   CMP Latest Ref Rng & Units 06/23/2019 06/09/2019 06/02/2019  Glucose 70 - 99 mg/dL 117(H) 98 98  BUN 8 - 23 mg/dL 22 28(H)  39(H)  Creatinine 0.61 - 1.24 mg/dL 1.22 1.08 1.54(H)  Sodium 135 - 145 mmol/L 138 143 139  Potassium 3.5 - 5.1 mmol/L 3.8 4.1 4.6  Chloride 98 - 111 mmol/L 105 109 102  CO2 22 - 32 mmol/L '24 26 27  ' Calcium 8.9 - 10.3 mg/dL 9.3 9.6 9.7  Total Protein 6.5 - 8.1 g/dL 6.3(L) 6.5 6.4(L)   Total Bilirubin 0.3 - 1.2 mg/dL 0.7 0.8 0.8  Alkaline Phos 38 - 126 U/L 56 61 63  AST 15 - 41 U/L '21 22 22  ' ALT 0 - 44 U/L 32 32 31        ASSESSMENT & PLAN:   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HCC) 1.  AML: -Diagnosed with high-grade MDS on 07/30/2018, MDS-EB 2.  Flow cytometry 12% blasts.  Chromosome and FISH panel normal. -4 cycles of Vidaza from 08/22/2018 through 12/02/2018. -Repeat bone marrow biopsy on 12/23/2018 at [facility] consistent with AML. -4 cycles of decitabine and venetoclax from 01/20/2019 through 05/26/2019.  He is taking venetoclax 200 mg for 2 weeks each cycle. -Bone marrow biopsy on 03/05/2019 did not show any evidence of leukemia. -We have reviewed his CBC today.  White count is low at 1.1 with 15% neutrophils.  Hemoglobin is 14.2 and platelet count is 192. -LFTs are within normal limits. -We will hold off on his next cycle today.  He was told to not to start venetoclax.  He had gotten a new shipment. -He is okay to receive COVID-19 vaccine.  We will reevaluate him in 1 week for follow-up.  2.  CKD: -His creatinine ranges from 1.1-1.5.  It has improved since we discontinued lisinopril.  3.  Bilateral knee pains: -He will continue Tylenol 3 times a day along with Tylenol.  4.  Hypomagnesemia: -He will continue magnesium once daily at home.  5.  Hypertension: -We had to hold his lisinopril because of elevated creatinine. -He reports sometimes his blood pressure goes up to 150.  Today it is 133/77. -I will start him on Norvasc 2.5 mg to be taken at home as needed for high blood pressure.     Orders placed this encounter:  No orders of the defined types were placed in this encounter.     [name], MD [place] [phone]

## 2019-06-23 NOTE — Assessment & Plan Note (Addendum)
1.  AML: -Diagnosed with high-grade MDS on 07/30/2018, MDS-EB 2.  Flow cytometry 12% blasts.  Chromosome and FISH panel normal. -4 cycles of Vidaza from 08/22/2018 through 12/02/2018. -Repeat bone marrow biopsy on 12/23/2018 at [facility] consistent with AML. -4 cycles of decitabine and venetoclax from 01/20/2019 through 05/26/2019.  He is taking venetoclax 200 mg for 2 weeks each cycle. -Bone marrow biopsy on 03/05/2019 did not show any evidence of leukemia. -We have reviewed his CBC today.  White count is low at 1.1 with 15% neutrophils.  Hemoglobin is 14.2 and platelet count is 192. -LFTs are within normal limits. -We will hold off on his next cycle today.  He was told to not to start venetoclax.  He had gotten a new shipment. -He is okay to receive COVID-19 vaccine.  We will reevaluate him in 1 week for follow-up.  2.  CKD: -His creatinine ranges from 1.1-1.5.  It has improved since we discontinued lisinopril.  3.  Bilateral knee pains: -He will continue Tylenol 3 times a day along with Tylenol.  4.  Hypomagnesemia: -He will continue magnesium once daily at home.  5.  Hypertension: -We had to hold his lisinopril because of elevated creatinine. -He reports sometimes his blood pressure goes up to 150.  Today it is 133/77. -I will start him on Norvasc 2.5 mg to be taken at home as needed for high blood pressure.

## 2019-06-23 NOTE — Progress Notes (Signed)
CRITICAL VALUE ALERT Critical value received:  WBC 1.1 Date of notification:  06-23-2019 Time of notification: [id number] Critical value read back:  Yes.   Nurse who received alert:  C.Neil Brickell RN MD notified time [phone]

## 2019-06-23 NOTE — Patient Instructions (Addendum)
[place] at [facility] Discharge Instructions  You were seen today by Dr. Delton Coombes. He went over your recent lab results. He will send in a new prescription for blood pressure to your pharmacy. Do not start back on your Venetoclax until you see [place] next week.He will see you back in 1 week for labs, treatment and follow up.   Thank you for choosing [place] at [facility] to provide your oncology and hematology care.  To afford each patient quality time with our provider, please arrive at least 15 minutes before your scheduled appointment time.   If you have a lab appointment with the [facility] please come in thru the  Main Entrance and check in at the main information desk  You need to re-schedule your appointment should you arrive 10 or more minutes late.  We strive to give you quality time with our providers, and arriving late affects you and other patients whose appointments are after yours.  Also, if you no show three or more times for appointments you may be dismissed from the clinic at the providers discretion.     Again, thank you for choosing [facility].  Our hope is that these requests will decrease the amount of time that you wait before being seen by our physicians.       _____________________________________________________________  Should you have questions after your visit to [facility], please contact our office at [phone] between the hours of 8:00 a.m. and 4:30 p.m.  Voicemails left after 4:00 p.m. will not be returned until the following business day.  For prescription refill requests, have your pharmacy contact our office and allow 72 hours.    Cancer Center Support Programs:   > Cancer Support Group  2nd Tuesday of the month 1pm-2pm, Journey Room

## 2019-06-23 NOTE — Patient Instructions (Signed)
[place] at [facility] Discharge Instructions Decitabine infusion held today. Follow-up as scheduled. Call clinic for any questions or concerns   Thank you for choosing [place] at [facility] to provide your oncology and hematology care.  To afford each patient quality time with our provider, please arrive at least 15 minutes before your scheduled appointment time.   If you have a lab appointment with the [facility] please come in thru the Main Entrance and check in at the main information desk.  You need to re-schedule your appointment should you arrive 10 or more minutes late.  We strive to give you quality time with our providers, and arriving late affects you and other patients whose appointments are after yours.  Also, if you no show three or more times for appointments you may be dismissed from the clinic at the providers discretion.     Again, thank you for choosing [facility].  Our hope is that these requests will decrease the amount of time that you wait before being seen by our physicians.       _____________________________________________________________  Should you have questions after your visit to [facility], please contact our office at [phone] between the hours of 8:00 a.m. and 4:30 p.m.  Voicemails left after 4:00 p.m. will not be returned until the following business day.  For prescription refill requests, have your pharmacy contact our office and allow 72 hours.    Due to Covid, you will need to wear a mask upon entering the hospital. If you do not have a mask, a mask will be given to you at the Main Entrance upon arrival. For doctor visits, patients may have 1 support person with them. For treatment visits, patients can not have anyone with them due to social distancing guidelines and our immunocompromised population.

## 2019-06-23 NOTE — Progress Notes (Signed)
1110 Labs reviewed with and pt seen by Dr. Delton Coombes and pt's Decitabine infusion to be held today due to low blood counts per MD                            1115 Pt discharged self ambulatory using his walker in satisfactory condition

## 2019-06-24 ENCOUNTER — Ambulatory Visit (HOSPITAL_COMMUNITY): Payer: Medicare Other

## 2019-06-25 ENCOUNTER — Ambulatory Visit (HOSPITAL_COMMUNITY): Payer: Medicare Other

## 2019-06-26 ENCOUNTER — Ambulatory Visit (HOSPITAL_COMMUNITY): Payer: Medicare Other

## 2019-06-27 ENCOUNTER — Ambulatory Visit (HOSPITAL_COMMUNITY): Payer: Medicare Other

## 2019-06-30 ENCOUNTER — Ambulatory Visit (HOSPITAL_COMMUNITY): Payer: Medicare Other

## 2019-06-30 ENCOUNTER — Inpatient Hospital Stay (HOSPITAL_COMMUNITY): Payer: Medicare Other

## 2019-06-30 ENCOUNTER — Other Ambulatory Visit (HOSPITAL_COMMUNITY): Payer: Medicare Other

## 2019-06-30 ENCOUNTER — Encounter (HOSPITAL_COMMUNITY): Payer: Self-pay | Admitting: Hematology

## 2019-06-30 ENCOUNTER — Ambulatory Visit (HOSPITAL_COMMUNITY): Payer: Medicare Other | Admitting: Hematology

## 2019-06-30 ENCOUNTER — Inpatient Hospital Stay (HOSPITAL_BASED_OUTPATIENT_CLINIC_OR_DEPARTMENT_OTHER): Payer: Medicare Other | Admitting: Hematology

## 2019-06-30 ENCOUNTER — Other Ambulatory Visit: Payer: Self-pay

## 2019-06-30 VITALS — BP 131/71 | HR 76 | Temp 97.4°F | Resp 18 | Wt 224.1 lb

## 2019-06-30 DIAGNOSIS — C9200 Acute myeloblastic leukemia, not having achieved remission: Secondary | ICD-10-CM

## 2019-06-30 DIAGNOSIS — D46Z Other myelodysplastic syndromes: Secondary | ICD-10-CM

## 2019-06-30 DIAGNOSIS — D61818 Other pancytopenia: Secondary | ICD-10-CM

## 2019-06-30 LAB — COMPREHENSIVE METABOLIC PANEL
ALT: 27 U/L (ref 0–44)
AST: 20 U/L (ref 15–41)
Albumin: 3.8 g/dL (ref 3.5–5.0)
Alkaline Phosphatase: 57 U/L (ref 38–126)
Anion gap: 7 (ref 5–15)
BUN: 20 mg/dL (ref 8–23)
CO2: 25 mmol/L (ref 22–32)
Calcium: 9.4 mg/dL (ref 8.9–10.3)
Chloride: 108 mmol/L (ref 98–111)
Creatinine, Ser: 0.97 mg/dL (ref 0.61–1.24)
GFR calc Af Amer: >60 mL/min (ref >60)
GFR calc non Af Amer: >60 mL/min (ref >60)
Glucose, Bld: 128 mg/dL — ABNORMAL HIGH (ref 70–99)
Potassium: 3.7 mmol/L (ref 3.5–5.1)
Sodium: 140 mmol/L (ref 135–145)
Total Bilirubin: 0.6 mg/dL (ref 0.3–1.2)
Total Protein: 6.1 g/dL — ABNORMAL LOW (ref 6.5–8.1)

## 2019-06-30 LAB — CBC WITH DIFFERENTIAL/PLATELET
Abs Immature Granulocytes: 0.03 10*3/uL (ref 0.00–0.07)
Basophils Absolute: 0 10*3/uL (ref 0.0–0.1)
Basophils Relative: 1 %
Eosinophils Absolute: 0 10*3/uL (ref 0.0–0.5)
Eosinophils Relative: 1 %
HCT: 44.4 % (ref 39.0–52.0)
Hemoglobin: 14.6 g/dL (ref 13.0–17.0)
Immature Granulocytes: 2 %
Lymphocytes Relative: 47 %
Lymphs Abs: 1 10*3/uL (ref 0.7–4.0)
MCH: 31.9 pg (ref 26.0–34.0)
MCHC: 32.9 g/dL (ref 30.0–36.0)
MCV: 97.2 fL (ref 80.0–100.0)
Monocytes Absolute: 0.3 10*3/uL (ref 0.1–1.0)
Monocytes Relative: 17 %
Neutro Abs: 0.6 10*3/uL — ABNORMAL LOW (ref 1.7–7.7)
Neutrophils Relative %: 32 %
Platelets: 143 10*3/uL — ABNORMAL LOW (ref 150–400)
RBC: 4.57 MIL/uL (ref 4.22–5.81)
RDW: 17.2 % — ABNORMAL HIGH (ref 11.5–15.5)
WBC: 2 10*3/uL — ABNORMAL LOW (ref 4.0–10.5)
nRBC: 0 % (ref 0.0–0.2)

## 2019-06-30 LAB — MAGNESIUM: Magnesium: 1.6 mg/dL — ABNORMAL LOW (ref 1.7–2.4)

## 2019-06-30 LAB — URIC ACID: Uric Acid, Serum: 4.1 mg/dL (ref 3.7–8.6)

## 2019-06-30 LAB — PHOSPHORUS: Phosphorus: 3.1 mg/dL (ref 2.5–4.6)

## 2019-06-30 MED ORDER — OCTREOTIDE ACETATE 30 MG IM KIT
PACK | INTRAMUSCULAR | Status: AC
  Filled 2019-06-30: qty 1

## 2019-06-30 NOTE — Assessment & Plan Note (Signed)
1.  AML: -4 cycles of decitabine and venetoclax from 01/20/2019 through 05/26/2019. -He is taking venetoclax 200 mg for 2 weeks each cycle. -Bone marrow biopsy on 03/05/2019 after cycle 1 did not show any evidence of leukemia. -We reviewed his CBC today.  ANC is 600.  Hence I would hold off on his treatment today. -LFTs are within normal limits. -I will reevaluate him in next week for treatment initiation.  He received new shipment of venetoclax.  2.  CKD: -His creatinine ranges between 1.1-1.5.  Today his creatinine improved to 0.97.  2.  Bilateral knee pains: -He will continue Tylenol as needed.  He will continue tramadol as needed.  4.  Hypomagnesemia: -He will continue magnesium once daily.  Magnesium today is 1.6.  5.  Hypertension: -We will continue to hold lisinopril.  He will use Norvasc 2.5 mg as needed.  Blood pressure today is 131/71.

## 2019-06-30 NOTE — Patient Instructions (Signed)
[facility] at [facility] Discharge Instructions  Labs drawn peripherally today   Thank you for choosing  [facility] at [facility] to provide your oncology and hematology care.  To afford each patient quality time with our provider, please arrive at least 15 minutes before your scheduled appointment time.   If you have a lab appointment with the Cancer Center please come in thru the Main Entrance and check in at the main information desk.  You need to re-schedule your appointment should you arrive 10 or more minutes late.  We strive to give you quality time with our providers, and arriving late affects you and other patients whose appointments are after yours.  Also, if you no show three or more times for appointments you may be dismissed from the clinic at the providers discretion.     Again, thank you for choosing [facility].  Our hope is that these requests will decrease the amount of time that you wait before being seen by our physicians.       _____________________________________________________________  Should you have questions after your visit to [facility], please contact our office at [phone] between the hours of 8:00 a.m. and 4:30 p.m.  Voicemails left after 4:00 p.m. will not be returned until the following business day.  For prescription refill requests, have your pharmacy contact our office and allow 72 hours.    Due to Covid, you will need to wear a mask upon entering the hospital. If you do not have a mask, a mask will be given to you at the Main Entrance upon arrival. For doctor visits, patients may have 1 support person with them. For treatment visits, patients can not have anyone with them due to social distancing guidelines and our immunocompromised population.     

## 2019-06-30 NOTE — Progress Notes (Signed)
Labs reviewed with MD today at office visit. Will hold treatment this week per MD.   Discharged home from clinic ambulatory. Follow up as scheduled.

## 2019-06-30 NOTE — Progress Notes (Signed)
[facility] [address], [place] [address]   CLINIC:  Medical Oncology/Hematology  PCP:  [name], MD [place] [address] [phone]   REASON FOR VISIT:  Follow-up for newly diagnosed AML.  CURRENT THERAPY: Decitabine and venetoclax  BRIEF ONCOLOGIC HISTORY:  Oncology History  MDS (myelodysplastic syndrome), high grade (HCC)  08/14/2018 Initial Diagnosis   MDS (myelodysplastic syndrome), high grade ([place])   08/22/2018 - 12/31/2018 Chemotherapy   The patient had palonosetron (ALOXI) injection 0.25 mg, 0.25 mg, Intravenous,  Once, 4 of 6 cycles Administration: 0.25 mg (08/22/2018), 0.25 mg (08/26/2018), 0.25 mg (08/28/2018), 0.25 mg (08/30/2018), 0.25 mg (10/01/2018), 0.25 mg (10/02/2018), 0.25 mg (11/04/2018), 0.25 mg (09/23/2018), 0.25 mg (09/25/2018), 0.25 mg (09/27/2018), 0.25 mg (10/28/2018), 0.25 mg (10/30/2018), 0.25 mg (11/01/2018), 0.25 mg (12/02/2018), 0.25 mg (12/04/2018), 0.25 mg (12/06/2018) azaCITIDine (VIDAZA) 100 mg in sodium chloride 0.9 % 50 mL chemo infusion, 110 mg (66.7 % of original dose 75 mg/m2), Intravenous, Once, 4 of 6 cycles Dose modification: 50 mg/m2 (original dose 75 mg/m2, Cycle 1, Reason: Provider Judgment), 50 mg/m2 (original dose 75 mg/m2, Cycle 2, Reason: Provider Judgment) Administration: 100 mg (08/22/2018), 100 mg (08/23/2018), 100 mg (08/26/2018), 100 mg (08/27/2018), 100 mg (08/28/2018), 100 mg (08/29/2018), 100 mg (08/30/2018), 100 mg (10/01/2018), 100 mg (10/02/2018), 100 mg (11/04/2018), 100 mg (11/05/2018), 100 mg (09/23/2018), 100 mg (09/24/2018), 100 mg (09/25/2018), 100 mg (09/26/2018), 100 mg (09/27/2018), 100 mg (10/28/2018), 100 mg (10/29/2018), 100 mg (10/30/2018), 100 mg (10/31/2018), 100 mg (11/01/2018), 100 mg (12/02/2018), 100 mg (12/03/2018), 100 mg (12/04/2018), 100 mg (12/05/2018), 100 mg (12/06/2018)  for chemotherapy treatment.    01/20/2019 -  Chemotherapy   The patient had decitabine (DACOGEN) 45 mg in sodium chloride 0.9 %  50 mL chemo infusion, 20 mg/m2 = 45 mg, Intravenous,  Once, 4 of 6 cycles Dose modification: 15 mg/m2 (original dose 20 mg/m2, Cycle 2, Reason: Other (see comments), Comment: neutropenic fever) Administration: 45 mg (01/20/2019), 45 mg (01/21/2019), 45 mg (01/22/2019), 45 mg (01/23/2019), 45 mg (01/24/2019), 35 mg (03/17/2019), 35 mg (03/18/2019), 35 mg (03/19/2019), 35 mg (03/20/2019), 35 mg (03/21/2019), 35 mg (04/22/2019), 35 mg (04/23/2019), 35 mg (04/24/2019), 35 mg (04/25/2019), 35 mg (05/26/2019), 35 mg (05/27/2019), 35 mg (05/28/2019), 35 mg (05/29/2019), 35 mg (05/30/2019) ondansetron (ZOFRAN) 8 mg in sodium chloride 0.9 % 50 mL IVPB, 8 mg (100 % of original dose 8 mg), Intravenous,  Once, 4 of 6 cycles Dose modification: 8 mg (original dose 8 mg, Cycle 1) Administration: 8 mg (01/20/2019), 8 mg (01/21/2019), 8 mg (01/22/2019), 8 mg (01/23/2019), 8 mg (01/24/2019), 8 mg (03/17/2019), 8 mg (03/18/2019), 8 mg (03/19/2019), 8 mg (03/20/2019), 8 mg (03/21/2019), 8 mg (04/22/2019), 8 mg (04/23/2019), 8 mg (04/24/2019), 8 mg (04/25/2019), 8 mg (05/26/2019), 8 mg (05/27/2019), 8 mg (05/28/2019), 8 mg (05/29/2019), 8 mg (05/30/2019)  for chemotherapy treatment.    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[place])  01/07/2019 Initial Diagnosis   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[place])   01/20/2019 -  Chemotherapy   The patient had decitabine (DACOGEN) 45 mg in sodium chloride 0.9 % 50 mL chemo infusion, 20 mg/m2 = 45 mg, Intravenous,  Once, 4 of 6 cycles Dose modification: 15 mg/m2 (original dose 20 mg/m2, Cycle 2, Reason: Other (see comments), Comment: neutropenic fever) Administration: 45 mg (01/20/2019), 45 mg (01/21/2019), 45 mg (01/22/2019), 45 mg (01/23/2019), 45 mg (01/24/2019), 35 mg (03/17/2019), 35 mg (03/18/2019), 35 mg (03/19/2019), 35 mg (03/20/2019), 35 mg (03/21/2019),  35 mg (04/22/2019), 35 mg (04/23/2019), 35 mg (04/24/2019), 35 mg (04/25/2019), 35 mg (05/26/2019), 35 mg (05/27/2019), 35 mg (05/28/2019), 35 mg (05/29/2019), 35  mg (05/30/2019) ondansetron (ZOFRAN) 8 mg in sodium chloride 0.9 % 50 mL IVPB, 8 mg (100 % of original dose 8 mg), Intravenous,  Once, 4 of 6 cycles Dose modification: 8 mg (original dose 8 mg, Cycle 1) Administration: 8 mg (01/20/2019), 8 mg (01/21/2019), 8 mg (01/22/2019), 8 mg (01/23/2019), 8 mg (01/24/2019), 8 mg (03/17/2019), 8 mg (03/18/2019), 8 mg (03/19/2019), 8 mg (03/20/2019), 8 mg (03/21/2019), 8 mg (04/22/2019), 8 mg (04/23/2019), 8 mg (04/24/2019), 8 mg (04/25/2019), 8 mg (05/26/2019), 8 mg (05/27/2019), 8 mg (05/28/2019), 8 mg (05/29/2019), 8 mg (05/30/2019)  for chemotherapy treatment.       CANCER STAGING: Cancer Staging No matching staging information was found for the patient.   INTERVAL HISTORY:  [name] 74 y.o. male seen for follow-up and toxicity assessment for AML.  Appetite is 100%.  Energy levels are 50%.  Denies any fevers or chills.  Denies nausea vomiting or diarrhea.  REVIEW OF SYSTEMS:  Review of Systems  Cardiovascular: Positive for leg swelling.  Musculoskeletal: Positive for arthralgias.  All other systems reviewed and are negative.    PAST MEDICAL/SURGICAL HISTORY:  Past Medical History:  Diagnosis Date  . Arthritis   . 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 Cancer ([place]) 01-28-2014   skin cancer  . CKD (chronic kidney disease), stage III   . GERD (gastroesophageal reflux disease)   . Heart murmur    NOTED DURING PHYSICAL WHEN HE WAS ENLISTING IN [facility] , DIDNT KNOW UNTIL THAT TIME AND REPORTS , "THATS THE LAST I HEARD ABOUT IT "   . History of hypertension    no longer issue  . History of kidney stones   . History of malignant melanoma of skin    excision top of scalp 2015-- no recurrence  . History of urinary retention    post op lumbar fusion surgery 04/ 2016  . Hypertension   . 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 Left ureteral calculus   . Seasonal allergies    . Wears glasses   . Wears glasses   . Wears partial dentures    upper and lower   Past Surgical History:  Procedure Laterality Date  . ANKLE FUSION Right 2007  . CARPAL TUNNEL RELEASE Left 12/28/2009   w/ pulley release left long finger  . CARPAL TUNNEL RELEASE Right 07/22/2013   Procedure: RIGHT CARPAL TUNNEL RELEASE;  Surgeon: Cammie Sickle., MD;  Location: [facility];  Service: Orthopedics;  Laterality: Right;  . COLONOSCOPY    . CYSTO/  LEFT RETROGRADE PYELOGRAM  11/21/2010  . CYSTOSCOPY WITH STENT PLACEMENT Left 03/09/2016   Procedure: CYSTOSCOPY WITH STENT PLACEMENT;  Surgeon: Franchot Gallo, MD;  Location: [facility];  Service: Urology;  Laterality: Left;  . CYSTOSCOPY/RETROGRADE/URETEROSCOPY/STONE EXTRACTION WITH BASKET Left 03/09/2016   Procedure: CYSTOSCOPY/RETROGRADE/URETEROSCOPY/STONE EXTRACTION WITH BASKET;  Surgeon: Franchot Gallo, MD;  Location: [facility];  Service: Urology;  Laterality: Left;  . LEFT URETEROSCOPIC LASER LITHOTRIPSY STONE EXTRACTION/ STENT PLACEMENT  05/23/2010  . MOHS SURGERY     TOP OF THE HEAD   . ORIF ANKLE FRACTURE Right 1978  . PORT-A-CATH REMOVAL Right 02/14/2019   Procedure: MINOR REMOVAL PORT-A-CATH;  Surgeon: Aviva Signs, MD;  Location: [facility];  Service: General;  Laterality: Right;  . PORTACATH PLACEMENT Right 08/19/2018   Procedure: INSERTION PORT-A-CATH (attached catheter in right subclavian);  Surgeon: Aviva Signs, MD;  Location: [facility];  Service: General;  Laterality: Right;  . PORTACATH PLACEMENT Left 05/16/2019   Procedure: INSERTION PORT-A-CATH (attached catheter in left subclavian);  Surgeon: Aviva Signs, MD;  Location: [facility];  Service: General;  Laterality: Left;  . POSTERIOR LUMBAR FUSION  08/21/2014   laminectomy and decompression L2 -- L5  . RIGHT LOWER LEG SURGERY  X3  1975 to 1976   including ORIF  . TONSILLECTOMY AND ADENOIDECTOMY  1986  . UMBILICAL HERNIA REPAIR   2009 approx     SOCIAL HISTORY:  Social History   Socioeconomic History  . Marital status: Married    Spouse name: Not on file  . Number of children: Not on file  . Years of education: Not on file  . Highest education level: Not on file  Occupational History  . Not on file  Tobacco Use  . Smoking status: Former Smoker    Years: 20.00    Types: Cigarettes    Quit date: 07/17/1986    Years since quitting: 32.9  . Smokeless tobacco: Never Used  Substance and Sexual Activity  . Alcohol use: Yes    Alcohol/week: 7.0 - 14.0 standard drinks    Types: 7 - 14 Cans of beer per week    Comment: 1 -2 beer daily  . Drug use: No  . Sexual activity: Not Currently  Other Topics Concern  . Not on file  Social History Narrative  . Not on file   Social Determinants of Health   Financial Resource Strain: Unknown  . Difficulty of Paying Living Expenses: Patient refused  Food Insecurity: Unknown  . Worried About Charity fundraiser in the Last Year: Patient refused  . Ran Out of Food in the Last Year: Patient refused  Transportation Needs: Unknown  . Lack of Transportation (Medical): Patient refused  . Lack of Transportation (Non-Medical): Patient refused  Physical Activity: Unknown  . Days of Exercise per Week: Patient refused  . Minutes of Exercise per Session: Patient refused  Stress: Unknown  . Feeling of Stress : Patient refused  Social Connections: Unknown  . Frequency of Communication with Friends and Family: Patient refused  . Frequency of Social Gatherings with Friends and Family: Patient refused  . Attends Religious Services: Patient refused  . Active Member of Clubs or Organizations: Patient refused  . Attends Archivist Meetings: Patient refused  . Marital Status: Patient refused  Intimate Partner Violence: Unknown  . Fear of Current or Ex-Partner: Patient refused  . Emotionally Abused: Patient refused  . Physically Abused: Patient refused  . Sexually  Abused: Patient refused    FAMILY HISTORY:  Family History  Problem Relation Age of Onset  . Stroke Mother   . Prostate cancer Father   . Bone cancer Father   . Diverticulitis Father   . Rheum arthritis Sister   . Urinary tract infection Sister   . Colon cancer Neg Hx     CURRENT MEDICATIONS:  Outpatient Encounter Medications as of 06/30/2019  Medication Sig  . allopurinol (ZYLOPRIM) 300 MG tablet Take 300 mg by mouth at bedtime.   Marland Kitchen amLODipine (NORVASC) 2.5 MG tablet Take 1 tablet (2.5 mg total) by mouth daily.  Marland Kitchen Apoaequorin (PREVAGEN) 10 MG CAPS Take 1 capsule by mouth daily.  . cetirizine (ZYRTEC) 10 MG tablet  Take 10 mg by mouth daily. IN THE MORNING  . Cholecalciferol (VITAMIN D3) 1.25 MG (50000 UT) CAPS Take 1 capsule by mouth daily.  Marland Kitchen docusate sodium (COLACE) 100 MG capsule Take 100 mg by mouth at bedtime.   . lansoprazole (PREVACID) 15 MG capsule Take 15 mg by mouth daily.   . magnesium oxide (MAG-OX) 400 MG tablet Take by mouth.  . tamsulosin (FLOMAX) 0.4 MG CAPS capsule Take 0.4 mg by mouth every evening.   . traMADol (ULTRAM) 50 MG tablet TAKE 1 TABLET BY MOUTH THREE TIMES DAILY AS NEEDED FOR PAIN  . venetoclax 100 MG TABS Take 200 mg by mouth daily.  Marland Kitchen zinc gluconate 50 MG tablet Take 50 mg by mouth daily.  . [DISCONTINUED] hydrochlorothiazide (HYDRODIURIL) 12.5 MG tablet Take 12.5 mg by mouth daily.   Marland Kitchen acetaminophen (TYLENOL) 500 MG tablet Take 1,000 mg by mouth 3 (three) times daily.   . diclofenac sodium (VOLTAREN) 1 % GEL Apply 1 g topically 3 (three) times daily as needed (knee pain.).   Marland Kitchen lidocaine-prilocaine (EMLA) cream Apply 1 application topically See admin instructions. ONE HOUR PRIOR TO CHEMOTHERAPY APPOINTMENT   No facility-administered encounter medications on file as of 06/30/2019.    ALLERGIES:  No Known Allergies   PHYSICAL EXAM:  ECOG Performance status: 1  Vitals:   06/30/19 0841  BP: 131/71  Pulse: 76  Resp: 18  Temp: (!) 97.4 F  (36.3 C)  SpO2: 94%   Filed Weights   06/30/19 0841  Weight: 224 lb 1.6 oz (101.7 kg)    Physical Exam Constitutional:      Appearance: Normal appearance.  HENT:     Mouth/Throat:     Mouth: Mucous membranes are moist.     Pharynx: Oropharynx is clear.  Eyes:     Extraocular Movements: Extraocular movements intact.     Conjunctiva/sclera: Conjunctivae normal.  Cardiovascular:     Rate and Rhythm: Normal rate and regular rhythm.     Heart sounds: Normal heart sounds.  Pulmonary:     Effort: Pulmonary effort is normal.     Breath sounds: Normal breath sounds.  Abdominal:     General: There is no distension.     Palpations: Abdomen is soft. There is no mass.  Musculoskeletal:        General: No deformity. Normal range of motion.     Cervical back: Normal range of motion.  Skin:    General: Skin is warm.  Neurological:     General: No focal deficit present.     Mental Status: He is alert and oriented to person, place, and time.  Psychiatric:        Mood and Affect: Mood normal.        Behavior: Behavior normal.      LABORATORY DATA:  I have reviewed the labs as listed.  CBC    Component Value Date/Time   WBC 2.0 (L) 06/30/2019 0807   RBC 4.57 06/30/2019 0807   HGB 14.6 06/30/2019 0807   HCT 44.4 06/30/2019 0807   PLT 143 (L) 06/30/2019 0807   MCV 97.2 06/30/2019 0807   MCH 31.9 06/30/2019 0807   MCHC 32.9 06/30/2019 0807   RDW 17.2 (H) 06/30/2019 0807   LYMPHSABS 1.0 06/30/2019 0807   MONOABS 0.3 06/30/2019 0807   EOSABS 0.0 06/30/2019 0807   BASOSABS 0.0 06/30/2019 0807   CMP Latest Ref Rng & Units 06/30/2019 06/23/2019 06/09/2019  Glucose 70 - 99 mg/dL 128(H) 117(H) 98  BUN 8 - 23 mg/dL 20 22 28(H)  Creatinine 0.61 - 1.24 mg/dL 0.97 1.22 1.08  Sodium 135 - 145 mmol/L 140 138 143  Potassium 3.5 - 5.1 mmol/L 3.7 3.8 4.1  Chloride 98 - 111 mmol/L 108 105 109  CO2 22 - 32 mmol/L '25 24 26  ' Calcium 8.9 - 10.3 mg/dL 9.4 9.3 9.6  Total Protein 6.5 - 8.1 g/dL  6.1(L) 6.3(L) 6.5  Total Bilirubin 0.3 - 1.2 mg/dL 0.6 0.7 0.8  Alkaline Phos 38 - 126 U/L 57 56 61  AST 15 - 41 U/L '20 21 22  ' ALT 0 - 44 U/L 27 32 32        ASSESSMENT & PLAN:   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HCC) 1.  AML: -4 cycles of decitabine and venetoclax from 01/20/2019 through 05/26/2019. -He is taking venetoclax 200 mg for 2 weeks each cycle. -Bone marrow biopsy on 03/05/2019 after cycle 1 did not show any evidence of leukemia. -We reviewed his CBC today.  ANC is 600.  Hence I would hold off on his treatment today. -LFTs are within normal limits. -I will reevaluate him in next week for treatment initiation.  He received new shipment of venetoclax.  2.  CKD: -His creatinine ranges between 1.1-1.5.  Today his creatinine improved to 0.97.  2.  Bilateral knee pains: -He will continue Tylenol as needed.  He will continue tramadol as needed.  4.  Hypomagnesemia: -He will continue magnesium once daily.  Magnesium today is 1.6.  5.  Hypertension: -We will continue to hold lisinopril.  He will use Norvasc 2.5 mg as needed.  Blood pressure today is 131/71.    Orders placed this encounter:  Orders Placed This Encounter  Procedures  . CBC with Differential/Platelet  . Comprehensive metabolic panel  . Magnesium  . Lactate dehydrogenase      [name], MD [place] [phone]

## 2019-06-30 NOTE — Patient Instructions (Addendum)
[facility] at [facility] Discharge Instructions  You were seen today by Dr. Delton Coombes. He went over your recent lab results. Continue to HOLD your Venetoclax. He will HOLD your treatment today. He will see you back in 1 week for labs, treatment and follow up.   Thank you for choosing [facility] at [facility] to provide your oncology and hematology care.  To afford each patient quality time with our provider, please arrive at least 15 minutes before your scheduled appointment time.   If you have a lab appointment with the [facility] please come in thru the  Main Entrance and check in at the main information desk  You need to re-schedule your appointment should you arrive 10 or more minutes late.  We strive to give you quality time with our providers, and arriving late affects you and other patients whose appointments are after yours.  Also, if you no show three or more times for appointments you may be dismissed from the clinic at the providers discretion.     Again, thank you for choosing [facility].  Our hope is that these requests will decrease the amount of time that you wait before being seen by our physicians.       _____________________________________________________________  Should you have questions after your visit to [facility], please contact our office at [phone] between the hours of 8:00 a.m. and 4:30 p.m.  Voicemails left after 4:00 p.m. will not be returned until the following business day.  For prescription refill requests, have your pharmacy contact our office and allow 72 hours.    Cancer Center Support Programs:   > Cancer Support Group  2nd Tuesday of the month 1pm-2pm, Journey Room

## 2019-06-30 NOTE — Progress Notes (Signed)
Patient has been assessed, vital signs and labs have been reviewed by Dr. Delton Coombes.He will HOLD Tx today due blood counts and will return in 1 week with labs and Tx per Dr. Delton Coombes.

## 2019-07-01 ENCOUNTER — Ambulatory Visit (HOSPITAL_COMMUNITY): Payer: Medicare Other

## 2019-07-02 ENCOUNTER — Ambulatory Visit (HOSPITAL_COMMUNITY): Payer: Medicare Other

## 2019-07-03 ENCOUNTER — Ambulatory Visit (HOSPITAL_COMMUNITY): Payer: Medicare Other

## 2019-07-04 ENCOUNTER — Ambulatory Visit (HOSPITAL_COMMUNITY): Payer: Medicare Other

## 2019-07-07 ENCOUNTER — Inpatient Hospital Stay (HOSPITAL_BASED_OUTPATIENT_CLINIC_OR_DEPARTMENT_OTHER): Payer: Medicare Other | Admitting: Hematology

## 2019-07-07 ENCOUNTER — Other Ambulatory Visit: Payer: Self-pay

## 2019-07-07 ENCOUNTER — Encounter (HOSPITAL_COMMUNITY): Payer: Self-pay

## 2019-07-07 ENCOUNTER — Inpatient Hospital Stay (HOSPITAL_COMMUNITY): Payer: Medicare Other

## 2019-07-07 ENCOUNTER — Encounter (HOSPITAL_COMMUNITY): Payer: Self-pay | Admitting: Hematology

## 2019-07-07 ENCOUNTER — Inpatient Hospital Stay (HOSPITAL_COMMUNITY): Payer: Medicare Other | Attending: Hematology

## 2019-07-07 VITALS — BP 138/80 | HR 72 | Temp 98.2°F | Resp 18

## 2019-07-07 DIAGNOSIS — C9200 Acute myeloblastic leukemia, not having achieved remission: Secondary | ICD-10-CM | POA: Insufficient documentation

## 2019-07-07 DIAGNOSIS — M25561 Pain in right knee: Secondary | ICD-10-CM | POA: Insufficient documentation

## 2019-07-07 DIAGNOSIS — I129 Hypertensive chronic kidney disease with stage 1 through stage 4 chronic kidney disease, or unspecified chronic kidney disease: Secondary | ICD-10-CM | POA: Insufficient documentation

## 2019-07-07 DIAGNOSIS — Z85820 Personal history of malignant melanoma of skin: Secondary | ICD-10-CM | POA: Insufficient documentation

## 2019-07-07 DIAGNOSIS — M1990 Unspecified osteoarthritis, unspecified site: Secondary | ICD-10-CM | POA: Diagnosis not present

## 2019-07-07 DIAGNOSIS — Z79899 Other long term (current) drug therapy: Secondary | ICD-10-CM | POA: Diagnosis not present

## 2019-07-07 DIAGNOSIS — N183 Chronic kidney disease, stage 3 unspecified: Secondary | ICD-10-CM | POA: Insufficient documentation

## 2019-07-07 DIAGNOSIS — Z5111 Encounter for antineoplastic chemotherapy: Secondary | ICD-10-CM | POA: Insufficient documentation

## 2019-07-07 DIAGNOSIS — K219 Gastro-esophageal reflux disease without esophagitis: Secondary | ICD-10-CM | POA: Insufficient documentation

## 2019-07-07 DIAGNOSIS — M25562 Pain in left knee: Secondary | ICD-10-CM | POA: Insufficient documentation

## 2019-07-07 DIAGNOSIS — Z87891 Personal history of nicotine dependence: Secondary | ICD-10-CM | POA: Diagnosis not present

## 2019-07-07 DIAGNOSIS — Z95828 Presence of other vascular implants and grafts: Secondary | ICD-10-CM

## 2019-07-07 DIAGNOSIS — D46Z Other myelodysplastic syndromes: Secondary | ICD-10-CM

## 2019-07-07 LAB — CBC WITH DIFFERENTIAL/PLATELET
Abs Immature Granulocytes: 0.03 10*3/uL (ref 0.00–0.07)
Basophils Absolute: 0 10*3/uL (ref 0.0–0.1)
Basophils Relative: 1 %
Eosinophils Absolute: 0 10*3/uL (ref 0.0–0.5)
Eosinophils Relative: 1 %
HCT: 43.3 % (ref 39.0–52.0)
Hemoglobin: 14.2 g/dL (ref 13.0–17.0)
Immature Granulocytes: 1 %
Lymphocytes Relative: 35 %
Lymphs Abs: 1.2 10*3/uL (ref 0.7–4.0)
MCH: 32.3 pg (ref 26.0–34.0)
MCHC: 32.8 g/dL (ref 30.0–36.0)
MCV: 98.4 fL (ref 80.0–100.0)
Monocytes Absolute: 0.5 10*3/uL (ref 0.1–1.0)
Monocytes Relative: 16 %
Neutro Abs: 1.6 10*3/uL — ABNORMAL LOW (ref 1.7–7.7)
Neutrophils Relative %: 46 %
Platelets: 137 10*3/uL — ABNORMAL LOW (ref 150–400)
RBC: 4.4 MIL/uL (ref 4.22–5.81)
RDW: 17.9 % — ABNORMAL HIGH (ref 11.5–15.5)
WBC: 3.4 10*3/uL — ABNORMAL LOW (ref 4.0–10.5)
nRBC: 0 % (ref 0.0–0.2)

## 2019-07-07 LAB — COMPREHENSIVE METABOLIC PANEL
ALT: 38 U/L (ref 0–44)
AST: 25 U/L (ref 15–41)
Albumin: 3.7 g/dL (ref 3.5–5.0)
Alkaline Phosphatase: 64 U/L (ref 38–126)
Anion gap: 9 (ref 5–15)
BUN: 21 mg/dL (ref 8–23)
CO2: 25 mmol/L (ref 22–32)
Calcium: 9.2 mg/dL (ref 8.9–10.3)
Chloride: 109 mmol/L (ref 98–111)
Creatinine, Ser: 1.02 mg/dL (ref 0.61–1.24)
GFR calc Af Amer: >60 mL/min (ref >60)
GFR calc non Af Amer: >60 mL/min (ref >60)
Glucose, Bld: 121 mg/dL — ABNORMAL HIGH (ref 70–99)
Potassium: 3.7 mmol/L (ref 3.5–5.1)
Sodium: 143 mmol/L (ref 135–145)
Total Bilirubin: 0.6 mg/dL (ref 0.3–1.2)
Total Protein: 6.2 g/dL — ABNORMAL LOW (ref 6.5–8.1)

## 2019-07-07 LAB — MAGNESIUM: Magnesium: 1.6 mg/dL — ABNORMAL LOW (ref 1.7–2.4)

## 2019-07-07 LAB — LACTATE DEHYDROGENASE: LDH: 153 U/L (ref 98–192)

## 2019-07-07 MED ORDER — MAGNESIUM SULFATE 50 % IJ SOLN: 1.0000 g | Freq: Once | INTRAVENOUS | Status: DC

## 2019-07-07 MED ORDER — SODIUM CHLORIDE 0.9 % IV SOLN
8.0000 mg | Freq: Once | INTRAVENOUS | Status: AC
  Administered 2019-07-07: 8 mg via INTRAVENOUS
  Filled 2019-07-07: qty 4

## 2019-07-07 MED ORDER — HEPARIN SOD (PORK) LOCK FLUSH 100 UNIT/ML IV SOLN
500.0000 [IU] | Freq: Once | INTRAVENOUS | Status: AC | PRN
  Administered 2019-07-07: 500 [IU]

## 2019-07-07 MED ORDER — MAGNESIUM OXIDE 400 MG PO TABS: 400.0000 mg | ORAL_TABLET | Freq: Two times a day (BID) | ORAL | 2 refills | Status: DC

## 2019-07-07 MED ORDER — MAGNESIUM SULFATE IN D5W 1-5 GM/100ML-% IV SOLN
1.0000 g | Freq: Once | INTRAVENOUS | Status: AC
  Administered 2019-07-07: 1 g via INTRAVENOUS
  Filled 2019-07-07: qty 100

## 2019-07-07 MED ORDER — SODIUM CHLORIDE 0.9 % IV SOLN
15.0000 mg/m2 | Freq: Once | INTRAVENOUS | Status: AC
  Administered 2019-07-07: 35 mg via INTRAVENOUS
  Filled 2019-07-07: qty 7

## 2019-07-07 MED ORDER — SODIUM CHLORIDE 0.9 % IV SOLN: Freq: Once | INTRAVENOUS | Status: AC

## 2019-07-07 MED ORDER — SODIUM CHLORIDE 0.9% FLUSH
10.0000 mL | INTRAVENOUS | Status: DC | PRN
  Administered 2019-07-07: 10 mL

## 2019-07-07 NOTE — Progress Notes (Signed)
Patient has been assessed, vital signs and labs have been reviewed by Dr. Delton Coombes. ANC, Creatinine, LFTs, and Platelets are within treatment parameters, magnesium is slightly low today, please give 1 gram of magnesium IV today per Dr. Delton Coombes. The patient is good to proceed with treatment at this time.

## 2019-07-07 NOTE — Patient Instructions (Signed)
[facility] at [facility] Discharge Instructions  Labs drawn from portacath today   Thank you for choosing  Cancer Center at [facility] to provide your oncology and hematology care.  To afford each patient quality time with our provider, please arrive at least 15 minutes before your scheduled appointment time.   If you have a lab appointment with the Cancer Center please come in thru the Main Entrance and check in at the main information desk.  You need to re-schedule your appointment should you arrive 10 or more minutes late.  We strive to give you quality time with our providers, and arriving late affects you and other patients whose appointments are after yours.  Also, if you no show three or more times for appointments you may be dismissed from the clinic at the providers discretion.     Again, thank you for choosing [facility].  Our hope is that these requests will decrease the amount of time that you wait before being seen by our physicians.       _____________________________________________________________  Should you have questions after your visit to [facility], please contact our office at [phone] between the hours of 8:00 a.m. and 4:30 p.m.  Voicemails left after 4:00 p.m. will not be returned until the following business day.  For prescription refill requests, have your pharmacy contact our office and allow 72 hours.    Due to Covid, you will need to wear a mask upon entering the hospital. If you do not have a mask, a mask will be given to you at the Main Entrance upon arrival. For doctor visits, patients may have 1 support person with them. For treatment visits, patients can not have anyone with them due to social distancing guidelines and our immunocompromised population.     

## 2019-07-07 NOTE — Patient Instructions (Signed)
[facility] Discharge Instructions for Patients Receiving Chemotherapy   Beginning January 23rd 2017 lab work for the [facility] will be done in the  Main lab at [facility] on 1st floor. If you have a lab appointment with the [facility] please come in thru the  Main Entrance and check in at the main information desk   Today you received the following chemotherapy agents Decitabine infusion as well as Magnesium infusion. Follow-up as scheduled. Call clinic for any questions or concerns  To help prevent nausea and vomiting after your treatment, we encourage you to take your nausea medication   If you develop nausea and vomiting, or diarrhea that is not controlled by your medication, call the clinic.  The clinic phone number is [phone]. Office hours are Monday-Friday 8:30am-5:00pm.  BELOW ARE SYMPTOMS THAT SHOULD BE REPORTED IMMEDIATELY:  *FEVER GREATER THAN 101.0 F  *CHILLS WITH OR WITHOUT FEVER  NAUSEA AND VOMITING THAT IS NOT CONTROLLED WITH YOUR NAUSEA MEDICATION  *UNUSUAL SHORTNESS OF BREATH  *UNUSUAL BRUISING OR BLEEDING  TENDERNESS IN MOUTH AND THROAT WITH OR WITHOUT PRESENCE OF ULCERS  *URINARY PROBLEMS  *BOWEL PROBLEMS  UNUSUAL RASH Items with * indicate a potential emergency and should be followed up as soon as possible. If you have an emergency after office hours please contact your primary care physician or go to the nearest emergency department.  Please call the clinic during office hours if you have any questions or concerns.   You may also contact the Patient Navigator at [phone] should you have any questions or need assistance in obtaining follow up care.      Resources For Cancer Patients and their Caregivers ? American Cancer Society: Can assist with transportation, wigs, general needs, runs Look Good Feel Better.        418-185-7843 ? Cancer Care: Provides financial assistance, online support groups,  medication/co-pay assistance.  1-800-813-HOPE 514-722-7653) ? [place] Assists [place] cancer patients and their families through emotional , educational and financial support.  7194482832 ? [place] DSS Where to apply for food stamps, Medicaid and utility assistance. 208-457-2896 ? [facility]: Transportation to medical appointments. 431-445-2736 ? Social Security Administration: May apply for disability if have a Stage IV cancer. 442-215-4469 (678) 112-8988 ? [employer], Disability and Transit Services: Assists with nutrition, care and transit needs. 585-522-6883

## 2019-07-07 NOTE — Progress Notes (Signed)
0908 Labs reviewed with and pt seen by Dr. Delton Coombes and pt approved for Decitabine infusion with Magnesium infusion 1 gram added for today per MD                                                          Richard Wallace tolerated Decitabine and magnesium infusions well without complaints or incident. VSS upon discharge. Pt discharged self ambulatory using his walker in satisfactory condition

## 2019-07-07 NOTE — Assessment & Plan Note (Addendum)
1.  AML: -4 cycles of decitabine and venetoclax from 01/20/2019 through 05/26/2019. -Bone marrow biopsy on 03/05/2019 after cycle 1 did not show any evidence of leukemia. -His chemotherapy was held last week secondary to low white count and ANC of 600. -I have reviewed his CBC today.  White count is 3.4 with ANC of 1600.  Platelet count is 137. -LFTs are within normal limits. -He will proceed with his cycle 5 today.  He will start his venetoclax 200 mg daily for 2 weeks. -I will reevaluate him in 2 weeks.  2.  CKD: -As creatinine ranges between 1.1-1.5.  3.  Bilateral knee pains: -Is continuing Tylenol and tramadol as needed.  Well-controlled.  4.  Hypomagnesemia: -He is taking magnesium once daily.  His magnesium today is 1.6.  He will receive intravenous magnesium. -We will increase his magnesium to twice daily.  5.  Hypertension: -Lisinopril is on hold.  He will use Norvasc 2.5 mg as needed.  Blood pressure today is 137/82.

## 2019-07-07 NOTE — Progress Notes (Signed)
[facility] [address] [place] [address]   CLINIC:  Medical Oncology/Hematology  PCP:  [name], MD [place] [id number] [phone]   REASON FOR VISIT:  Follow-up for newly diagnosed AML.  CURRENT THERAPY: Decitabine and venetoclax  BRIEF ONCOLOGIC HISTORY:  Oncology History  MDS (myelodysplastic syndrome), high grade (HCC)  08/14/2018 Initial Diagnosis   MDS (myelodysplastic syndrome), high grade ([place])   08/22/2018 - 12/31/2018 Chemotherapy   The patient had palonosetron (ALOXI) injection 0.25 mg, 0.25 mg, Intravenous,  Once, 4 of 6 cycles Administration: 0.25 mg (08/22/2018), 0.25 mg (08/26/2018), 0.25 mg (08/28/2018), 0.25 mg (08/30/2018), 0.25 mg (10/01/2018), 0.25 mg (10/02/2018), 0.25 mg (11/04/2018), 0.25 mg (09/23/2018), 0.25 mg (09/25/2018), 0.25 mg (09/27/2018), 0.25 mg (10/28/2018), 0.25 mg (10/30/2018), 0.25 mg (11/01/2018), 0.25 mg (12/02/2018), 0.25 mg (12/04/2018), 0.25 mg (12/06/2018) azaCITIDine (VIDAZA) 100 mg in sodium chloride 0.9 % 50 mL chemo infusion, 110 mg (66.7 % of original dose 75 mg/m2), Intravenous, Once, 4 of 6 cycles Dose modification: 50 mg/m2 (original dose 75 mg/m2, Cycle 1, Reason: Provider Judgment), 50 mg/m2 (original dose 75 mg/m2, Cycle 2, Reason: Provider Judgment) Administration: 100 mg (08/22/2018), 100 mg (08/23/2018), 100 mg (08/26/2018), 100 mg (08/27/2018), 100 mg (08/28/2018), 100 mg (08/29/2018), 100 mg (08/30/2018), 100 mg (10/01/2018), 100 mg (10/02/2018), 100 mg (11/04/2018), 100 mg (11/05/2018), 100 mg (09/23/2018), 100 mg (09/24/2018), 100 mg (09/25/2018), 100 mg (09/26/2018), 100 mg (09/27/2018), 100 mg (10/28/2018), 100 mg (10/29/2018), 100 mg (10/30/2018), 100 mg (10/31/2018), 100 mg (11/01/2018), 100 mg (12/02/2018), 100 mg (12/03/2018), 100 mg (12/04/2018), 100 mg (12/05/2018), 100 mg (12/06/2018)  for chemotherapy treatment.    01/20/2019 -  Chemotherapy   The patient had decitabine (DACOGEN) 45 mg in sodium chloride 0.9 %  50 mL chemo infusion, 20 mg/m2 = 45 mg, Intravenous,  Once, 5 of 6 cycles Dose modification: 15 mg/m2 (original dose 20 mg/m2, Cycle 2, Reason: Other (see comments), Comment: neutropenic fever) Administration: 45 mg (01/20/2019), 45 mg (01/21/2019), 45 mg (01/22/2019), 45 mg (01/23/2019), 45 mg (01/24/2019), 35 mg (03/17/2019), 35 mg (03/18/2019), 35 mg (03/19/2019), 35 mg (03/20/2019), 35 mg (03/21/2019), 35 mg (04/22/2019), 35 mg (04/23/2019), 35 mg (04/24/2019), 35 mg (04/25/2019), 35 mg (05/26/2019), 35 mg (05/27/2019), 35 mg (05/28/2019), 35 mg (05/29/2019), 35 mg (05/30/2019), 35 mg (07/07/2019) ondansetron (ZOFRAN) 8 mg in sodium chloride 0.9 % 50 mL IVPB, 8 mg (100 % of original dose 8 mg), Intravenous,  Once, 5 of 6 cycles Dose modification: 8 mg (original dose 8 mg, Cycle 1) Administration: 8 mg (01/20/2019), 8 mg (01/21/2019), 8 mg (01/22/2019), 8 mg (01/23/2019), 8 mg (01/24/2019), 8 mg (03/17/2019), 8 mg (03/18/2019), 8 mg (03/19/2019), 8 mg (03/20/2019), 8 mg (03/21/2019), 8 mg (04/22/2019), 8 mg (04/23/2019), 8 mg (04/24/2019), 8 mg (04/25/2019), 8 mg (05/26/2019), 8 mg (05/27/2019), 8 mg (05/28/2019), 8 mg (05/29/2019), 8 mg (05/30/2019), 8 mg (07/07/2019)  for chemotherapy treatment.    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[place])  01/07/2019 Initial Diagnosis   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[place])   01/20/2019 -  Chemotherapy   The patient had decitabine (DACOGEN) 45 mg in sodium chloride 0.9 % 50 mL chemo infusion, 20 mg/m2 = 45 mg, Intravenous,  Once, 5 of 6 cycles Dose modification: 15 mg/m2 (original dose 20 mg/m2, Cycle 2, Reason: Other (see comments), Comment: neutropenic fever) Administration: 45 mg (01/20/2019), 45 mg (01/21/2019), 45 mg (01/22/2019), 45 mg (01/23/2019), 45 mg (01/24/2019), 35 mg (03/17/2019), 35 mg (03/18/2019), 35 mg (03/19/2019),  35 mg (03/20/2019), 35 mg (03/21/2019), 35 mg (04/22/2019), 35 mg (04/23/2019), 35 mg (04/24/2019), 35 mg (04/25/2019), 35 mg (05/26/2019), 35 mg (05/27/2019), 35 mg  (05/28/2019), 35 mg (05/29/2019), 35 mg (05/30/2019), 35 mg (07/07/2019) ondansetron (ZOFRAN) 8 mg in sodium chloride 0.9 % 50 mL IVPB, 8 mg (100 % of original dose 8 mg), Intravenous,  Once, 5 of 6 cycles Dose modification: 8 mg (original dose 8 mg, Cycle 1) Administration: 8 mg (01/20/2019), 8 mg (01/21/2019), 8 mg (01/22/2019), 8 mg (01/23/2019), 8 mg (01/24/2019), 8 mg (03/17/2019), 8 mg (03/18/2019), 8 mg (03/19/2019), 8 mg (03/20/2019), 8 mg (03/21/2019), 8 mg (04/22/2019), 8 mg (04/23/2019), 8 mg (04/24/2019), 8 mg (04/25/2019), 8 mg (05/26/2019), 8 mg (05/27/2019), 8 mg (05/28/2019), 8 mg (05/29/2019), 8 mg (05/30/2019), 8 mg (07/07/2019)  for chemotherapy treatment.       CANCER STAGING: Cancer Staging No matching staging information was found for the patient.   INTERVAL HISTORY:  [name] 74 y.o. male seen for follow-up and toxicity assessment prior to his next cycle of AML.  Reports appetite 100%.  Energy levels are 50%.  Knee pains are stable.  He is taking tramadol as needed.  Mild fatigue is present.  Denies any fevers or chills.  No bleeding including nosebleeds or rectal bleeding.  No new pains.  REVIEW OF SYSTEMS:  Review of Systems  Musculoskeletal: Positive for arthralgias.  All other systems reviewed and are negative.    PAST MEDICAL/SURGICAL HISTORY:  Past Medical History:  Diagnosis Date  . Arthritis   . 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 Cancer ([place]) 01-28-2014   skin cancer  . CKD (chronic kidney disease), stage III   . GERD (gastroesophageal reflux disease)   . Heart murmur    NOTED DURING PHYSICAL WHEN HE WAS ENLISTING IN MIL[facility]ARY , DIDNT KNOW UNTIL THAT TIME AND REPORTS , "THATS THE LAST I HEARD ABOUT IT "   . History of hypertension    no longer issue  . History of kidney stones   . History of malignant melanoma of skin    excision top of scalp 2015-- no recurrence  . History of urinary retention    post op lumbar fusion surgery 04/ 2016  .  Hypertension   . 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 Left ureteral calculus   . Seasonal allergies   . Wears glasses   . Wears glasses   . Wears partial dentures    upper and lower   Past Surgical History:  Procedure Laterality Date  . ANKLE FUSION Right 2007  . CARPAL TUNNEL RELEASE Left 12/28/2009   w/ pulley release left long finger  . CARPAL TUNNEL RELEASE Right 07/22/2013   Procedure: RIGHT CARPAL TUNNEL RELEASE;  Surgeon: Cammie Sickle., MD;  Location: [facility];  Service: Orthopedics;  Laterality: Right;  . COLONOSCOPY    . CYSTO/  LEFT RETROGRADE PYELOGRAM  11/21/2010  . CYSTOSCOPY WITH STENT PLACEMENT Left 03/09/2016   Procedure: CYSTOSCOPY WITH STENT PLACEMENT;  Surgeon: Franchot Gallo, MD;  Location: [facility];  Service: Urology;  Laterality: Left;  . CYSTOSCOPY/RETROGRADE/URETEROSCOPY/STONE EXTRACTION WITH BASKET Left 03/09/2016   Procedure: CYSTOSCOPY/RETROGRADE/URETEROSCOPY/STONE EXTRACTION WITH BASKET;  Surgeon: Franchot Gallo, MD;  Location: [facility];  Service: Urology;  Laterality: Left;  . LEFT URETEROSCOPIC LASER LITHOTRIPSY STONE EXTRACTION/ STENT PLACEMENT  05/23/2010  . MOHS SURGERY  TOP OF THE HEAD   . ORIF ANKLE FRACTURE Right 1978  . PORT-A-CATH REMOVAL Right 02/14/2019   Procedure: MINOR REMOVAL PORT-A-CATH;  Surgeon: Aviva Signs, MD;  Location: [facility];  Service: General;  Laterality: Right;  . PORTACATH PLACEMENT Right 08/19/2018   Procedure: INSERTION PORT-A-CATH (attached catheter in right subclavian);  Surgeon: Aviva Signs, MD;  Location: [facility];  Service: General;  Laterality: Right;  . PORTACATH PLACEMENT Left 05/16/2019   Procedure: INSERTION PORT-A-CATH (attached catheter in left subclavian);  Surgeon: Aviva Signs, MD;  Location: [facility];  Service: General;  Laterality: Left;  . POSTERIOR LUMBAR FUSION  08/21/2014    laminectomy and decompression L2 -- L5  . RIGHT LOWER LEG SURGERY  X3  1975 to 1976   including ORIF  . TONSILLECTOMY AND ADENOIDECTOMY  1986  . UMBILICAL HERNIA REPAIR  2009 approx     SOCIAL HISTORY:  Social History   Socioeconomic History  . Marital status: Married    Spouse name: Not on file  . Number of children: Not on file  . Years of education: Not on file  . Highest education level: Not on file  Occupational History  . Not on file  Tobacco Use  . Smoking status: Former Smoker    Years: 20.00    Types: Cigarettes    Quit date: 07/17/1986    Years since quitting: 32.9  . Smokeless tobacco: Never Used  Substance and Sexual Activity  . Alcohol use: Yes    Alcohol/week: 7.0 - 14.0 standard drinks    Types: 7 - 14 Cans of beer per week    Comment: 1 -2 beer daily  . Drug use: No  . Sexual activity: Not Currently  Other Topics Concern  . Not on file  Social History Narrative  . Not on file   Social Determinants of Health   Financial Resource Strain: Unknown  . Difficulty of Paying Living Expenses: Patient refused  Food Insecurity: Unknown  . Worried About Charity fundraiser in the Last Year: Patient refused  . Ran Out of Food in the Last Year: Patient refused  Transportation Needs: Unknown  . Lack of Transportation (Medical): Patient refused  . Lack of Transportation (Non-Medical): Patient refused  Physical Activity: Unknown  . Days of Exercise per Week: Patient refused  . Minutes of Exercise per Session: Patient refused  Stress: Unknown  . Feeling of Stress : Patient refused  Social Connections: Unknown  . Frequency of Communication with Friends and Family: Patient refused  . Frequency of Social Gatherings with Friends and Family: Patient refused  . Attends Religious Services: Patient refused  . Active Member of Clubs or Organizations: Patient refused  . Attends Archivist Meetings: Patient refused  . Marital Status: Patient refused  Intimate  Partner Violence: Unknown  . Fear of Current or Ex-Partner: Patient refused  . Emotionally Abused: Patient refused  . Physically Abused: Patient refused  . Sexually Abused: Patient refused    FAMILY HISTORY:  Family History  Problem Relation Age of Onset  . Stroke Mother   . Prostate cancer Father   . Bone cancer Father   . Diverticulitis Father   . Rheum arthritis Sister   . Urinary tract infection Sister   . Colon cancer Neg Hx     CURRENT MEDICATIONS:  Outpatient Encounter Medications as of 07/07/2019  Medication Sig  . acetaminophen (TYLENOL) 500 MG tablet Take 1,000 mg by mouth 3 (three) times daily.   Marland Kitchen allopurinol (ZYLOPRIM)  300 MG tablet Take 300 mg by mouth at bedtime.   Marland Kitchen amLODipine (NORVASC) 2.5 MG tablet Take 1 tablet (2.5 mg total) by mouth daily.  Marland Kitchen Apoaequorin (PREVAGEN) 10 MG CAPS Take 1 capsule by mouth daily.  . cetirizine (ZYRTEC) 10 MG tablet Take 10 mg by mouth daily. IN THE MORNING  . Cholecalciferol (VITAMIN D3) 1.25 MG (50000 UT) CAPS Take 1 capsule by mouth daily.  . diclofenac sodium (VOLTAREN) 1 % GEL Apply 1 g topically 3 (three) times daily as needed (knee pain.).   Marland Kitchen docusate sodium (COLACE) 100 MG capsule Take 100 mg by mouth at bedtime.   . lansoprazole (PREVACID) 15 MG capsule Take 15 mg by mouth daily.   Marland Kitchen lidocaine-prilocaine (EMLA) cream Apply 1 application topically See admin instructions. ONE HOUR PRIOR TO CHEMOTHERAPY APPOINTMENT  . magnesium oxide (MAG-OX) 400 MG tablet Take 1 tablet (400 mg total) by mouth 2 (two) times daily.  . tamsulosin (FLOMAX) 0.4 MG CAPS capsule Take 0.4 mg by mouth every evening.   . traMADol (ULTRAM) 50 MG tablet TAKE 1 TABLET BY MOUTH THREE TIMES DAILY AS NEEDED FOR PAIN  . venetoclax 100 MG TABS Take 200 mg by mouth daily.  Marland Kitchen zinc gluconate 50 MG tablet Take 50 mg by mouth daily.  . [DISCONTINUED] magnesium oxide (MAG-OX) 400 MG tablet Take by mouth.   No facility-administered encounter medications on file as of  07/07/2019.    ALLERGIES:  No Known Allergies   PHYSICAL EXAM:  ECOG Performance status: 1  Vitals:   07/07/19 0755  BP: 137/82  Pulse: 82  Resp: 18  Temp: (!) 97.1 F (36.2 C)  SpO2: 95%   Filed Weights   07/07/19 0755  Weight: 225 lb 12.8 oz (102.4 kg)    Physical Exam Constitutional:      Appearance: Normal appearance.  HENT:     Mouth/Throat:     Mouth: Mucous membranes are moist.     Pharynx: Oropharynx is clear.  Eyes:     Extraocular Movements: Extraocular movements intact.     Conjunctiva/sclera: Conjunctivae normal.  Cardiovascular:     Rate and Rhythm: Normal rate and regular rhythm.     Heart sounds: Normal heart sounds.  Pulmonary:     Effort: Pulmonary effort is normal.     Breath sounds: Normal breath sounds.  Abdominal:     General: There is no distension.     Palpations: Abdomen is soft. There is no mass.  Musculoskeletal:        General: No deformity. Normal range of motion.     Cervical back: Normal range of motion.  Skin:    General: Skin is warm.  Neurological:     General: No focal deficit present.     Mental Status: He is alert and oriented to person, place, and time.  Psychiatric:        Mood and Affect: Mood normal.        Behavior: Behavior normal.      LABORATORY DATA:  I have reviewed the labs as listed.  CBC    Component Value Date/Time   WBC 3.4 (L) 07/07/2019 0822   RBC 4.40 07/07/2019 0822   HGB 14.2 07/07/2019 0822   HCT 43.3 07/07/2019 0822   PLT 137 (L) 07/07/2019 0822   MCV 98.4 07/07/2019 0822   MCH 32.3 07/07/2019 0822   MCHC 32.8 07/07/2019 0822   RDW 17.9 (H) 07/07/2019 0822   LYMPHSABS 1.2 07/07/2019 0822   MONOABS  0.5 07/07/2019 0822   EOSABS 0.0 07/07/2019 0822   BASOSABS 0.0 07/07/2019 0822   CMP Latest Ref Rng & Units 07/07/2019 06/30/2019 06/23/2019  Glucose 70 - 99 mg/dL 121(H) 128(H) 117(H)  BUN 8 - 23 mg/dL '21 20 22  ' Creatinine 0.61 - 1.24 mg/dL 1.02 0.97 1.22  Sodium 135 - 145 mmol/L 143 140  138  Potassium 3.5 - 5.1 mmol/L 3.7 3.7 3.8  Chloride 98 - 111 mmol/L 109 108 105  CO2 22 - 32 mmol/L '25 25 24  ' Calcium 8.9 - 10.3 mg/dL 9.2 9.4 9.3  Total Protein 6.5 - 8.1 g/dL 6.2(L) 6.1(L) 6.3(L)  Total Bilirubin 0.3 - 1.2 mg/dL 0.6 0.6 0.7  Alkaline Phos 38 - 126 U/L 64 57 56  AST 15 - 41 U/L '25 20 21  ' ALT 0 - 44 U/L 38 27 32        ASSESSMENT & PLAN:   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HCC) 1.  AML: -4 cycles of decitabine and venetoclax from 01/20/2019 through 05/26/2019. -Bone marrow biopsy on 03/05/2019 after cycle 1 did not show any evidence of leukemia. -His chemotherapy was held last week secondary to low white count and ANC of 600. -I have reviewed his CBC today.  White count is 3.4 with ANC of 1600.  Platelet count is 137. -LFTs are within normal limits. -He will proceed with his cycle 5 today.  He will start his venetoclax 200 mg daily for 2 weeks. -I will reevaluate him in 2 weeks.  2.  CKD: -As creatinine ranges between 1.1-1.5.  3.  Bilateral knee pains: -Is continuing Tylenol and tramadol as needed.  Well-controlled.  4.  Hypomagnesemia: -He is taking magnesium once daily.  His magnesium today is 1.6.  He will receive intravenous magnesium. -We will increase his magnesium to twice daily.  5.  Hypertension: -Lisinopril is on hold.  He will use Norvasc 2.5 mg as needed.  Blood pressure today is 137/82.    Orders placed this encounter:  Orders Placed This Encounter  Procedures  . CBC with Differential/Platelet  . Comprehensive metabolic panel  . Lactate dehydrogenase  . Magnesium      [name], MD [place] [phone]

## 2019-07-07 NOTE — Patient Instructions (Addendum)
[place] at [facility] Discharge Instructions  You were seen today by Dr. Delton Coombes. He went over your recent lab results. He will see you back in 2 weeks for labs and follow up.  START taking your Venetoclax today. New Prescription of magnesium sent to your pharmacy to take daily.  Thank you for choosing [facility] [place] at [facility] to provide your oncology and hematology care.  To afford each patient quality time with our provider, please arrive at least 15 minutes before your scheduled appointment time.   If you have a lab appointment with the [facility] please come in thru the  Main Entrance and check in at the main information desk  You need to re-schedule your appointment should you arrive 10 or more minutes late.  We strive to give you quality time with our providers, and arriving late affects you and other patients whose appointments are after yours.  Also, if you no show three or more times for appointments you may be dismissed from the clinic at the providers discretion.     Again, thank you for choosing [facility].  Our hope is that these requests will decrease the amount of time that you wait before being seen by our physicians.       _____________________________________________________________  Should you have questions after your visit to [facility], please contact our office at [phone] between the hours of 8:00 a.m. and 4:30 p.m.  Voicemails left after 4:00 p.m. will not be returned until the following business day.  For prescription refill requests, have your pharmacy contact our office and allow 72 hours.    Cancer Center Support Programs:   > Cancer Support Group  2nd Tuesday of the month 1pm-2pm, Journey Room

## 2019-07-08 ENCOUNTER — Inpatient Hospital Stay (HOSPITAL_COMMUNITY): Payer: Medicare Other

## 2019-07-08 ENCOUNTER — Ambulatory Visit (HOSPITAL_COMMUNITY): Payer: Medicare Other

## 2019-07-08 VITALS — BP 134/76 | HR 70 | Temp 97.7°F | Resp 18

## 2019-07-08 DIAGNOSIS — C9200 Acute myeloblastic leukemia, not having achieved remission: Secondary | ICD-10-CM

## 2019-07-08 DIAGNOSIS — Z5111 Encounter for antineoplastic chemotherapy: Secondary | ICD-10-CM | POA: Diagnosis not present

## 2019-07-08 DIAGNOSIS — D46Z Other myelodysplastic syndromes: Secondary | ICD-10-CM

## 2019-07-08 DIAGNOSIS — Z95828 Presence of other vascular implants and grafts: Secondary | ICD-10-CM

## 2019-07-08 MED ORDER — SODIUM CHLORIDE 0.9 % IV SOLN
15.0000 mg/m2 | Freq: Once | INTRAVENOUS | Status: AC
  Administered 2019-07-08: 35 mg via INTRAVENOUS
  Filled 2019-07-08: qty 7

## 2019-07-08 MED ORDER — SODIUM CHLORIDE 0.9 % IV SOLN: Freq: Once | INTRAVENOUS | Status: AC

## 2019-07-08 MED ORDER — SODIUM CHLORIDE 0.9 % IV SOLN
8.0000 mg | Freq: Once | INTRAVENOUS | Status: AC
  Administered 2019-07-08: 8 mg via INTRAVENOUS
  Filled 2019-07-08: qty 4

## 2019-07-08 MED ORDER — SODIUM CHLORIDE 0.9% FLUSH
10.0000 mL | INTRAVENOUS | Status: DC | PRN
  Administered 2019-07-08 (×2): 10 mL

## 2019-07-08 MED ORDER — HEPARIN SOD (PORK) LOCK FLUSH 100 UNIT/ML IV SOLN
500.0000 [IU] | Freq: Once | INTRAVENOUS | Status: AC | PRN
  Administered 2019-07-08: 500 [IU]

## 2019-07-08 NOTE — Progress Notes (Signed)
Treatment given per orders. Patient tolerated it well without problems. Vitals stable and discharged home from clinic ambulatory. Follow up as scheduled.  

## 2019-07-08 NOTE — Patient Instructions (Signed)
[facility] Discharge Instructions for Patients Receiving Chemotherapy  Today you received the following chemotherapy agents   To help prevent nausea and vomiting after your treatment, we encourage you to take your nausea medication   If you develop nausea and vomiting that is not controlled by your nausea medication, call the clinic.   BELOW ARE SYMPTOMS THAT SHOULD BE REPORTED IMMEDIATELY:  *FEVER GREATER THAN 100.5 F  *CHILLS WITH OR WITHOUT FEVER  NAUSEA AND VOMITING THAT IS NOT CONTROLLED WITH YOUR NAUSEA MEDICATION  *UNUSUAL SHORTNESS OF BREATH  *UNUSUAL BRUISING OR BLEEDING  TENDERNESS IN MOUTH AND THROAT WITH OR WITHOUT PRESENCE OF ULCERS  *URINARY PROBLEMS  *BOWEL PROBLEMS  UNUSUAL RASH Items with * indicate a potential emergency and should be followed up as soon as possible.  Feel free to call the clinic should you have any questions or concerns. The clinic phone number is [phone].  Please show the CHEMO ALERT CARD at check-in to the Emergency Department and triage nurse.   

## 2019-07-09 ENCOUNTER — Other Ambulatory Visit: Payer: Self-pay

## 2019-07-09 ENCOUNTER — Inpatient Hospital Stay (HOSPITAL_COMMUNITY): Payer: Medicare Other

## 2019-07-09 VITALS — BP 130/72 | HR 65 | Temp 97.7°F | Resp 18

## 2019-07-09 DIAGNOSIS — Z5111 Encounter for antineoplastic chemotherapy: Secondary | ICD-10-CM | POA: Diagnosis not present

## 2019-07-09 DIAGNOSIS — C9200 Acute myeloblastic leukemia, not having achieved remission: Secondary | ICD-10-CM

## 2019-07-09 DIAGNOSIS — D46Z Other myelodysplastic syndromes: Secondary | ICD-10-CM

## 2019-07-09 DIAGNOSIS — Z95828 Presence of other vascular implants and grafts: Secondary | ICD-10-CM

## 2019-07-09 MED ORDER — HEPARIN SOD (PORK) LOCK FLUSH 100 UNIT/ML IV SOLN
500.0000 [IU] | Freq: Once | INTRAVENOUS | Status: AC | PRN
  Administered 2019-07-09: 500 [IU]

## 2019-07-09 MED ORDER — SODIUM CHLORIDE 0.9 % IV SOLN: Freq: Once | INTRAVENOUS | Status: AC

## 2019-07-09 MED ORDER — SODIUM CHLORIDE 0.9 % IV SOLN
15.0000 mg/m2 | Freq: Once | INTRAVENOUS | Status: AC
  Administered 2019-07-09: 35 mg via INTRAVENOUS
  Filled 2019-07-09: qty 7

## 2019-07-09 MED ORDER — SODIUM CHLORIDE 0.9 % IV SOLN
8.0000 mg | Freq: Once | INTRAVENOUS | Status: AC
  Administered 2019-07-09: 8 mg via INTRAVENOUS
  Filled 2019-07-09: qty 4

## 2019-07-09 MED ORDER — SODIUM CHLORIDE 0.9% FLUSH
10.0000 mL | INTRAVENOUS | Status: DC | PRN
  Administered 2019-07-09: 10 mL

## 2019-07-09 NOTE — Progress Notes (Signed)
Treatment given per orders. Patient tolerated it well without problems. Vitals stable and discharged home from clinic ambulatory. Follow up as scheduled.  

## 2019-07-10 ENCOUNTER — Inpatient Hospital Stay (HOSPITAL_COMMUNITY): Payer: Medicare Other

## 2019-07-10 VITALS — BP 157/80 | HR 65 | Temp 97.9°F | Resp 18

## 2019-07-10 DIAGNOSIS — Z5111 Encounter for antineoplastic chemotherapy: Secondary | ICD-10-CM | POA: Diagnosis not present

## 2019-07-10 DIAGNOSIS — D46Z Other myelodysplastic syndromes: Secondary | ICD-10-CM

## 2019-07-10 DIAGNOSIS — C9200 Acute myeloblastic leukemia, not having achieved remission: Secondary | ICD-10-CM

## 2019-07-10 DIAGNOSIS — Z95828 Presence of other vascular implants and grafts: Secondary | ICD-10-CM

## 2019-07-10 MED ORDER — SODIUM CHLORIDE 0.9% FLUSH
10.0000 mL | INTRAVENOUS | Status: DC | PRN
  Administered 2019-07-10: 10 mL

## 2019-07-10 MED ORDER — SODIUM CHLORIDE 0.9 % IV SOLN
8.0000 mg | Freq: Once | INTRAVENOUS | Status: AC
  Administered 2019-07-10: 8 mg via INTRAVENOUS
  Filled 2019-07-10: qty 4

## 2019-07-10 MED ORDER — SODIUM CHLORIDE 0.9 % IV SOLN
15.0000 mg/m2 | Freq: Once | INTRAVENOUS | Status: AC
  Administered 2019-07-10: 35 mg via INTRAVENOUS
  Filled 2019-07-10: qty 7

## 2019-07-10 MED ORDER — HEPARIN SOD (PORK) LOCK FLUSH 100 UNIT/ML IV SOLN
500.0000 [IU] | Freq: Once | INTRAVENOUS | Status: AC | PRN
  Administered 2019-07-10: 500 [IU]

## 2019-07-10 MED ORDER — SODIUM CHLORIDE 0.9 % IV SOLN: Freq: Once | INTRAVENOUS | Status: AC

## 2019-07-10 NOTE — Patient Instructions (Signed)
[facility] Discharge Instructions for Patients Receiving Chemotherapy  Today you received the following chemotherapy agents   To help prevent nausea and vomiting after your treatment, we encourage you to take your nausea medication   If you develop nausea and vomiting that is not controlled by your nausea medication, call the clinic.   BELOW ARE SYMPTOMS THAT SHOULD BE REPORTED IMMEDIATELY:  *FEVER GREATER THAN 100.5 F  *CHILLS WITH OR WITHOUT FEVER  NAUSEA AND VOMITING THAT IS NOT CONTROLLED WITH YOUR NAUSEA MEDICATION  *UNUSUAL SHORTNESS OF BREATH  *UNUSUAL BRUISING OR BLEEDING  TENDERNESS IN MOUTH AND THROAT WITH OR WITHOUT PRESENCE OF ULCERS  *URINARY PROBLEMS  *BOWEL PROBLEMS  UNUSUAL RASH Items with * indicate a potential emergency and should be followed up as soon as possible.  Feel free to call the clinic should you have any questions or concerns. The clinic phone number is [phone].  Please show the CHEMO ALERT CARD at check-in to the Emergency Department and triage nurse.   

## 2019-07-10 NOTE — Progress Notes (Signed)
Patient presents today for treatment. Day 4 Decitabine. Vital signs are stable. Patient has no complaints of any changes since yesterday's treatment.   Treatment given today per MD orders. Tolerated infusion without adverse affects. Vital signs stable. No complaints at this time. Discharged from clinic ambulatory. F/U with [facility] as scheduled.

## 2019-07-11 ENCOUNTER — Inpatient Hospital Stay (HOSPITAL_COMMUNITY): Payer: Medicare Other

## 2019-07-11 ENCOUNTER — Other Ambulatory Visit: Payer: Self-pay

## 2019-07-11 ENCOUNTER — Encounter (HOSPITAL_COMMUNITY): Payer: Self-pay

## 2019-07-11 VITALS — BP 145/78 | HR 65 | Temp 97.9°F | Resp 18

## 2019-07-11 DIAGNOSIS — Z5111 Encounter for antineoplastic chemotherapy: Secondary | ICD-10-CM | POA: Diagnosis not present

## 2019-07-11 DIAGNOSIS — C9200 Acute myeloblastic leukemia, not having achieved remission: Secondary | ICD-10-CM

## 2019-07-11 DIAGNOSIS — D46Z Other myelodysplastic syndromes: Secondary | ICD-10-CM

## 2019-07-11 DIAGNOSIS — Z95828 Presence of other vascular implants and grafts: Secondary | ICD-10-CM

## 2019-07-11 MED ORDER — SODIUM CHLORIDE 0.9 % IV SOLN
15.0000 mg/m2 | Freq: Once | INTRAVENOUS | Status: AC
  Administered 2019-07-11: 35 mg via INTRAVENOUS
  Filled 2019-07-11: qty 7

## 2019-07-11 MED ORDER — SODIUM CHLORIDE 0.9% FLUSH
10.0000 mL | INTRAVENOUS | Status: DC | PRN
  Administered 2019-07-11: 10 mL

## 2019-07-11 MED ORDER — SODIUM CHLORIDE 0.9 % IV SOLN: Freq: Once | INTRAVENOUS | Status: AC

## 2019-07-11 MED ORDER — SODIUM CHLORIDE 0.9 % IV SOLN
8.0000 mg | Freq: Once | INTRAVENOUS | Status: AC
  Administered 2019-07-11: 8 mg via INTRAVENOUS
  Filled 2019-07-11: qty 4

## 2019-07-11 MED ORDER — HEPARIN SOD (PORK) LOCK FLUSH 100 UNIT/ML IV SOLN
500.0000 [IU] | Freq: Once | INTRAVENOUS | Status: AC | PRN
  Administered 2019-07-11: 500 [IU]

## 2019-07-11 NOTE — Patient Instructions (Signed)
[facility] Discharge Instructions for Patients Receiving Chemotherapy   Beginning January 23rd 2017 lab work for the [facility] will be done in the  Main lab at [facility] on 1st floor. If you have a lab appointment with the [facility] please come in thru the  Main Entrance and check in at the main information desk   Today you received the following chemotherapy agents Decitabine. Follow-up as scheduled. Call clinic for any questions or concerns  To help prevent nausea and vomiting after your treatment, we encourage you to take your nausea medication   If you develop nausea and vomiting, or diarrhea that is not controlled by your medication, call the clinic.  The clinic phone number is [phone]. Office hours are Monday-Friday 8:30am-5:00pm.  BELOW ARE SYMPTOMS THAT SHOULD BE REPORTED IMMEDIATELY:  *FEVER GREATER THAN 101.0 F  *CHILLS WITH OR WITHOUT FEVER  NAUSEA AND VOMITING THAT IS NOT CONTROLLED WITH YOUR NAUSEA MEDICATION  *UNUSUAL SHORTNESS OF BREATH  *UNUSUAL BRUISING OR BLEEDING  TENDERNESS IN MOUTH AND THROAT WITH OR WITHOUT PRESENCE OF ULCERS  *URINARY PROBLEMS  *BOWEL PROBLEMS  UNUSUAL RASH Items with * indicate a potential emergency and should be followed up as soon as possible. If you have an emergency after office hours please contact your primary care physician or go to the nearest emergency department.  Please call the clinic during office hours if you have any questions or concerns.   You may also contact the Patient Navigator at [phone] should you have any questions or need assistance in obtaining follow up care.      Resources For Cancer Patients and their Caregivers ? American Cancer Society: Can assist with transportation, wigs, general needs, runs Look Good Feel Better.        6718356962 ? Cancer Care: Provides financial assistance, online support groups, medication/co-pay assistance.  1-800-813-HOPE  873-243-3060) ? [facility] Assists [place] cancer patients and their families through emotional , educational and financial support.  908-619-1010 ? [place] DSS Where to apply for food stamps, Medicaid and utility assistance. 260 035 7231 ? [facility]: Transportation to medical appointments. (469)207-0534 ? Social Security Administration: May apply for disability if have a Stage IV cancer. 610-850-5097 443-629-1329 ? [employer], Disability and Transit Services: Assists with nutrition, care and transit needs. ([phone] [phone]

## 2019-07-11 NOTE — Progress Notes (Signed)
Richard Wallace tolerated Decitabine infusion well without complaints or incident. VSS upon discharge. Pt discharged self ambulatory using his walker in satisfactory condition

## 2019-07-21 ENCOUNTER — Inpatient Hospital Stay (HOSPITAL_COMMUNITY): Payer: Medicare Other

## 2019-07-21 ENCOUNTER — Inpatient Hospital Stay (HOSPITAL_BASED_OUTPATIENT_CLINIC_OR_DEPARTMENT_OTHER): Payer: Medicare Other | Admitting: Hematology

## 2019-07-21 ENCOUNTER — Other Ambulatory Visit: Payer: Self-pay

## 2019-07-21 DIAGNOSIS — C9200 Acute myeloblastic leukemia, not having achieved remission: Secondary | ICD-10-CM

## 2019-07-21 DIAGNOSIS — Z5111 Encounter for antineoplastic chemotherapy: Secondary | ICD-10-CM | POA: Diagnosis not present

## 2019-07-21 LAB — COMPREHENSIVE METABOLIC PANEL
ALT: 26 U/L (ref 0–44)
AST: 22 U/L (ref 15–41)
Albumin: 3.9 g/dL (ref 3.5–5.0)
Alkaline Phosphatase: 61 U/L (ref 38–126)
Anion gap: 9 (ref 5–15)
BUN: 24 mg/dL — ABNORMAL HIGH (ref 8–23)
CO2: 24 mmol/L (ref 22–32)
Calcium: 9.5 mg/dL (ref 8.9–10.3)
Chloride: 107 mmol/L (ref 98–111)
Creatinine, Ser: 0.96 mg/dL (ref 0.61–1.24)
GFR calc Af Amer: >60 mL/min (ref >60)
GFR calc non Af Amer: >60 mL/min (ref >60)
Glucose, Bld: 112 mg/dL — ABNORMAL HIGH (ref 70–99)
Potassium: 4 mmol/L (ref 3.5–5.1)
Sodium: 140 mmol/L (ref 135–145)
Total Bilirubin: 0.7 mg/dL (ref 0.3–1.2)
Total Protein: 6.4 g/dL — ABNORMAL LOW (ref 6.5–8.1)

## 2019-07-21 LAB — CBC WITH DIFFERENTIAL/PLATELET
Abs Immature Granulocytes: 0.01 10*3/uL (ref 0.00–0.07)
Basophils Absolute: 0 10*3/uL (ref 0.0–0.1)
Basophils Relative: 1 %
Eosinophils Absolute: 0 10*3/uL (ref 0.0–0.5)
Eosinophils Relative: 0 %
HCT: 40.8 % (ref 39.0–52.0)
Hemoglobin: 13.6 g/dL (ref 13.0–17.0)
Immature Granulocytes: 1 %
Lymphocytes Relative: 39 %
Lymphs Abs: 0.8 10*3/uL (ref 0.7–4.0)
MCH: 33.7 pg (ref 26.0–34.0)
MCHC: 33.3 g/dL (ref 30.0–36.0)
MCV: 101 fL — ABNORMAL HIGH (ref 80.0–100.0)
Monocytes Absolute: 0.2 10*3/uL (ref 0.1–1.0)
Monocytes Relative: 9 %
Neutro Abs: 1 10*3/uL — ABNORMAL LOW (ref 1.7–7.7)
Neutrophils Relative %: 50 %
Platelets: 95 10*3/uL — ABNORMAL LOW (ref 150–400)
RBC: 4.04 MIL/uL — ABNORMAL LOW (ref 4.22–5.81)
RDW: 17.9 % — ABNORMAL HIGH (ref 11.5–15.5)
WBC: 2 10*3/uL — ABNORMAL LOW (ref 4.0–10.5)
nRBC: 1 % — ABNORMAL HIGH (ref 0.0–0.2)

## 2019-07-21 LAB — LACTATE DEHYDROGENASE: LDH: 149 U/L (ref 98–192)

## 2019-07-21 LAB — MAGNESIUM: Magnesium: 1.6 mg/dL — ABNORMAL LOW (ref 1.7–2.4)

## 2019-07-21 MED ORDER — SODIUM CHLORIDE 0.9% FLUSH
20.0000 mL | INTRAVENOUS | Status: AC | PRN
  Administered 2019-07-21: 20 mL via INTRAVENOUS

## 2019-07-21 MED ORDER — MAGNESIUM OXIDE 400 MG PO TABS: 400.0000 mg | ORAL_TABLET | Freq: Three times a day (TID) | ORAL | 2 refills | Status: DC

## 2019-07-21 MED ORDER — HEPARIN SOD (PORK) LOCK FLUSH 100 UNIT/ML IV SOLN
500.0000 [IU] | Freq: Once | INTRAVENOUS | Status: AC
  Administered 2019-07-21: 500 [IU] via INTRAVENOUS

## 2019-07-21 NOTE — Assessment & Plan Note (Signed)
1.  AML: -5 cycles of decitabine and venetoclax from 01/20/2019 through 07/07/2019. -Bone marrow biopsy on 03/05/2019 after cycle 1 did not show any evidence of leukemia. -We reviewed labs from today.  White count is low at 2.0 with 50% neutrophils.  Platelet count is 95. -I have recommended stopping venetoclax at this time.  I plan to reevaluate him in 2 weeks for his next cycle of treatment.  2.  CKD: -Creatinine baseline is between 1.1 and 1.5.  3.  Hypomagnesemia: -He is taking magnesium twice daily.  His magnesium is low at 1.6. -We will increase his magnesium to 3 times a day.  4.  Hypertension: -Lisinopril is on hold.  He will continue Norvasc 2.5 mg as needed.  Blood pressure today is 140/76.  5.  Bilateral knee pains: -She will continue Tylenol and tramadol as needed.

## 2019-07-21 NOTE — Patient Instructions (Signed)
[facility] at [facility] Discharge Instructions  Labs drawn from [facility] today   Thank you for choosing [facility] at [facility] to provide your oncology and hematology care.  To afford each patient quality time with our provider, please arrive at least 15 minutes before your scheduled appointment time.   If you have a lab appointment with the Cancer Center please come in thru the Main Entrance and check in at the main information desk.  You need to re-schedule your appointment should you arrive 10 or more minutes late.  We strive to give you quality time with our providers, and arriving late affects you and other patients whose appointments are after yours.  Also, if you no show three or more times for appointments you may be dismissed from the clinic at the providers discretion.     Again, thank you for choosing [facility].  Our hope is that these requests will decrease the amount of time that you wait before being seen by our physicians.       _____________________________________________________________  Should you have questions after your visit to [facility], please contact our office at [phone] between the hours of 8:00 a.m. and 4:30 p.m.  Voicemails left after 4:00 p.m. will not be returned until the following business day.  For prescription refill requests, have your pharmacy contact our office and allow 72 hours.    Due to Covid, you will need to wear a mask upon entering the hospital. If you do not have a mask, a mask will be given to you at the Main Entrance upon arrival. For doctor visits, patients may have 1 support person with them. For treatment visits, patients can not have anyone with them due to social distancing guidelines and our immunocompromised population.     

## 2019-07-21 NOTE — Addendum Note (Signed)
Addended by: Anselmo Rod on: 07/21/2019 10:59 AM   Modules accepted: Orders

## 2019-07-21 NOTE — Patient Instructions (Addendum)
[place] at [facility] Discharge Instructions  You were seen today by Dr. Delton Coombes. He went over your recent lab results. STOP taking the Venetoclax. Increase magnesium to 3 times a day.  He will see you back in 1 week for labs and follow up.   Thank you for choosing [place] at [facility] to provide your oncology and hematology care.  To afford each patient quality time with our provider, please arrive at least 15 minutes before your scheduled appointment time.   If you have a lab appointment with the [facility] please come in thru the  Main Entrance and check in at the main information desk  You need to re-schedule your appointment should you arrive 10 or more minutes late.  We strive to give you quality time with our providers, and arriving late affects you and other patients whose appointments are after yours.  Also, if you no show three or more times for appointments you may be dismissed from the clinic at the providers discretion.     Again, thank you for choosing [facility]  Our hope is that these requests will decrease the amount of time that you wait before being seen by our physicians.       _____________________________________________________________  Should you have questions after your visit to [facility], please contact our office at [phone] between the hours of 8:00 a.m. and 4:30 p.m.  Voicemails left after 4:00 p.m. will not be returned until the following business day.  For prescription refill requests, have your pharmacy contact our office and allow 72 hours.    Cancer Center Support Programs:   > Cancer Support Group  2nd Tuesday of the month 1pm-2pm, Journey Room

## 2019-07-21 NOTE — Progress Notes (Signed)
[place] [address], [place] [address]   CLINIC:  Medical Oncology/Hematology  PCP:  [name], MD [place] [address] [phone]   REASON FOR VISIT:  Follow-up for newly diagnosed AML.  CURRENT THERAPY: Decitabine and venetoclax  BRIEF ONCOLOGIC HISTORY:  Oncology History  MDS (myelodysplastic syndrome), high grade (HCC)  08/14/2018 Initial Diagnosis   MDS (myelodysplastic syndrome), high grade ([place])   08/22/2018 - 12/31/2018 Chemotherapy   The patient had palonosetron (ALOXI) injection 0.25 mg, 0.25 mg, Intravenous,  Once, 4 of 6 cycles Administration: 0.25 mg (08/22/2018), 0.25 mg (08/26/2018), 0.25 mg (08/28/2018), 0.25 mg (08/30/2018), 0.25 mg (10/01/2018), 0.25 mg (10/02/2018), 0.25 mg (11/04/2018), 0.25 mg (09/23/2018), 0.25 mg (09/25/2018), 0.25 mg (09/27/2018), 0.25 mg (10/28/2018), 0.25 mg (10/30/2018), 0.25 mg (11/01/2018), 0.25 mg (12/02/2018), 0.25 mg (12/04/2018), 0.25 mg (12/06/2018) azaCITIDine (VIDAZA) 100 mg in sodium chloride 0.9 % 50 mL chemo infusion, 110 mg (66.7 % of original dose 75 mg/m2), Intravenous, Once, 4 of 6 cycles Dose modification: 50 mg/m2 (original dose 75 mg/m2, Cycle 1, Reason: Provider Judgment), 50 mg/m2 (original dose 75 mg/m2, Cycle 2, Reason: Provider Judgment) Administration: 100 mg (08/22/2018), 100 mg (08/23/2018), 100 mg (08/26/2018), 100 mg (08/27/2018), 100 mg (08/28/2018), 100 mg (08/29/2018), 100 mg (08/30/2018), 100 mg (10/01/2018), 100 mg (10/02/2018), 100 mg (11/04/2018), 100 mg (11/05/2018), 100 mg (09/23/2018), 100 mg (09/24/2018), 100 mg (09/25/2018), 100 mg (09/26/2018), 100 mg (09/27/2018), 100 mg (10/28/2018), 100 mg (10/29/2018), 100 mg (10/30/2018), 100 mg (10/31/2018), 100 mg (11/01/2018), 100 mg (12/02/2018), 100 mg (12/03/2018), 100 mg (12/04/2018), 100 mg (12/05/2018), 100 mg (12/06/2018)  for chemotherapy treatment.    01/20/2019 -  Chemotherapy   The patient had decitabine (DACOGEN) 45 mg in sodium chloride 0.9 %  50 mL chemo infusion, 20 mg/m2 = 45 mg, Intravenous,  Once, 5 of 6 cycles Dose modification: 15 mg/m2 (original dose 20 mg/m2, Cycle 2, Reason: Other (see comments), Comment: neutropenic fever) Administration: 45 mg (01/20/2019), 45 mg (01/21/2019), 45 mg (01/22/2019), 45 mg (01/23/2019), 45 mg (01/24/2019), 35 mg (03/17/2019), 35 mg (03/18/2019), 35 mg (03/19/2019), 35 mg (03/20/2019), 35 mg (03/21/2019), 35 mg (04/22/2019), 35 mg (04/23/2019), 35 mg (04/24/2019), 35 mg (04/25/2019), 35 mg (05/26/2019), 35 mg (05/27/2019), 35 mg (05/28/2019), 35 mg (05/29/2019), 35 mg (05/30/2019), 35 mg (07/07/2019), 35 mg (07/08/2019), 35 mg (07/09/2019), 35 mg (07/10/2019) ondansetron (ZOFRAN) 8 mg in sodium chloride 0.9 % 50 mL IVPB, 8 mg (100 % of original dose 8 mg), Intravenous,  Once, 5 of 6 cycles Dose modification: 8 mg (original dose 8 mg, Cycle 1) Administration: 8 mg (01/20/2019), 8 mg (01/21/2019), 8 mg (01/22/2019), 8 mg (01/23/2019), 8 mg (01/24/2019), 8 mg (03/17/2019), 8 mg (03/18/2019), 8 mg (03/19/2019), 8 mg (03/20/2019), 8 mg (03/21/2019), 8 mg (04/22/2019), 8 mg (04/23/2019), 8 mg (04/24/2019), 8 mg (04/25/2019), 8 mg (05/26/2019), 8 mg (05/27/2019), 8 mg (05/28/2019), 8 mg (05/29/2019), 8 mg (05/30/2019), 8 mg (07/07/2019), 8 mg (07/08/2019), 8 mg (07/09/2019), 8 mg (07/10/2019), 8 mg (07/11/2019)  for chemotherapy treatment.    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[place])  01/07/2019 Initial Diagnosis   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[place])   01/20/2019 -  Chemotherapy   The patient had decitabine (DACOGEN) 45 mg in sodium chloride 0.9 % 50 mL chemo infusion, 20 mg/m2 = 45 mg, Intravenous,  Once, 5 of 6 cycles Dose modification: 15 mg/m2 (original dose 20 mg/m2, Cycle 2, Reason: Other (see comments), Comment: neutropenic fever) Administration: 45 mg (01/20/2019),  45 mg (01/21/2019), 45 mg (01/22/2019), 45 mg (01/23/2019), 45 mg (01/24/2019), 35 mg (03/17/2019), 35 mg (03/18/2019), 35 mg (03/19/2019), 35 mg (03/20/2019), 35 mg (03/21/2019), 35  mg (04/22/2019), 35 mg (04/23/2019), 35 mg (04/24/2019), 35 mg (04/25/2019), 35 mg (05/26/2019), 35 mg (05/27/2019), 35 mg (05/28/2019), 35 mg (05/29/2019), 35 mg (05/30/2019), 35 mg (07/07/2019), 35 mg (07/08/2019), 35 mg (07/09/2019), 35 mg (07/10/2019) ondansetron (ZOFRAN) 8 mg in sodium chloride 0.9 % 50 mL IVPB, 8 mg (100 % of original dose 8 mg), Intravenous,  Once, 5 of 6 cycles Dose modification: 8 mg (original dose 8 mg, Cycle 1) Administration: 8 mg (01/20/2019), 8 mg (01/21/2019), 8 mg (01/22/2019), 8 mg (01/23/2019), 8 mg (01/24/2019), 8 mg (03/17/2019), 8 mg (03/18/2019), 8 mg (03/19/2019), 8 mg (03/20/2019), 8 mg (03/21/2019), 8 mg (04/22/2019), 8 mg (04/23/2019), 8 mg (04/24/2019), 8 mg (04/25/2019), 8 mg (05/26/2019), 8 mg (05/27/2019), 8 mg (05/28/2019), 8 mg (05/29/2019), 8 mg (05/30/2019), 8 mg (07/07/2019), 8 mg (07/08/2019), 8 mg (07/09/2019), 8 mg (07/10/2019), 8 mg (07/11/2019)  for chemotherapy treatment.       CANCER STAGING: Cancer Staging No matching staging information was found for the patient.   INTERVAL HISTORY:  [name] 74 y.o. male seen for follow-up and toxicity assessment for his chemotherapy for AML.  Last cycle of chemotherapy was on 07/07/2019.  He is currently taking venetoclax 200 mg daily.  Reports appetite of 100%.  Energy levels are 50%.  Denies any bleeding per rectum or melena.  No fevers or chills reported.  REVIEW OF SYSTEMS:  Review of Systems  Musculoskeletal: Positive for arthralgias.  All other systems reviewed and are negative.    PAST MEDICAL/SURGICAL HISTORY:  Past Medical History:  Diagnosis Date  . Arthritis   . 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 Cancer ([place]) 01-28-2014   skin cancer  . CKD (chronic kidney disease), stage III   . GERD (gastroesophageal reflux disease)   . Heart murmur    NOTED DURING PHYSICAL WHEN HE WAS ENLISTING IN [facility] , DIDNT KNOW UNTIL THAT TIME AND REPORTS , "THATS THE LAST I HEARD ABOUT IT "   . History of  hypertension    no longer issue  . History of kidney stones   . History of malignant melanoma of skin    excision top of scalp 2015-- no recurrence  . History of urinary retention    post op lumbar fusion surgery 04/ 2016  . Hypertension   . 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 Left ureteral calculus   . Seasonal allergies   . Wears glasses   . Wears glasses   . Wears partial dentures    upper and lower   Past Surgical History:  Procedure Laterality Date  . ANKLE FUSION Right 2007  . CARPAL TUNNEL RELEASE Left 12/28/2009   w/ pulley release left long finger  . CARPAL TUNNEL RELEASE Right 07/22/2013   Procedure: RIGHT CARPAL TUNNEL RELEASE;  Surgeon: Cammie Sickle., MD;  Location: [place];  Service: Orthopedics;  Laterality: Right;  . COLONOSCOPY    . CYSTO/  LEFT RETROGRADE PYELOGRAM  11/21/2010  . CYSTOSCOPY WITH STENT PLACEMENT Left 03/09/2016   Procedure: CYSTOSCOPY WITH STENT PLACEMENT;  Surgeon: Franchot Gallo, MD;  Location: [facility];  Service: Urology;  Laterality: Left;  . CYSTOSCOPY/RETROGRADE/URETEROSCOPY/STONE EXTRACTION WITH BASKET Left 03/09/2016   Procedure:  CYSTOSCOPY/RETROGRADE/URETEROSCOPY/STONE EXTRACTION WITH BASKET;  Surgeon: Franchot Gallo, MD;  Location: [facility];  Service: Urology;  Laterality: Left;  . LEFT URETEROSCOPIC LASER LITHOTRIPSY STONE EXTRACTION/ STENT PLACEMENT  05/23/2010  . MOHS SURGERY     TOP OF THE HEAD   . ORIF ANKLE FRACTURE Right 1978  . PORT-A-CATH REMOVAL Right 02/14/2019   Procedure: MINOR REMOVAL PORT-A-CATH;  Surgeon: Aviva Signs, MD;  Location: [facility];  Service: General;  Laterality: Right;  . PORTACATH PLACEMENT Right 08/19/2018   Procedure: INSERTION PORT-A-CATH (attached catheter in right subclavian);  Surgeon: Aviva Signs, MD;  Location: [facility];  Service: General;  Laterality: Right;  .  PORTACATH PLACEMENT Left 05/16/2019   Procedure: INSERTION PORT-A-CATH (attached catheter in left subclavian);  Surgeon: Aviva Signs, MD;  Location: [facility];  Service: General;  Laterality: Left;  . POSTERIOR LUMBAR FUSION  08/21/2014   laminectomy and decompression L2 -- L5  . RIGHT LOWER LEG SURGERY  X3  1975 to 1976   including ORIF  . TONSILLECTOMY AND ADENOIDECTOMY  1986  . UMBILICAL HERNIA REPAIR  2009 approx     SOCIAL HISTORY:  Social History   Socioeconomic History  . Marital status: Married    Spouse name: Not on file  . Number of children: Not on file  . Years of education: Not on file  . Highest education level: Not on file  Occupational History  . Not on file  Tobacco Use  . Smoking status: Former Smoker    Years: 20.00    Types: Cigarettes    Quit date: 07/17/1986    Years since quitting: 33.0  . Smokeless tobacco: Never Used  Substance and Sexual Activity  . Alcohol use: Yes    Alcohol/week: 7.0 - 14.0 standard drinks    Types: 7 - 14 Cans of beer per week    Comment: 1 -2 beer daily  . Drug use: No  . Sexual activity: Not Currently  Other Topics Concern  . Not on file  Social History Narrative  . Not on file   Social Determinants of Health   Financial Resource Strain: Unknown  . Difficulty of Paying Living Expenses: Patient refused  Food Insecurity: Unknown  . Worried About Charity fundraiser in the Last Year: Patient refused  . Ran Out of Food in the Last Year: Patient refused  Transportation Needs: Unknown  . Lack of Transportation (Medical): Patient refused  . Lack of Transportation (Non-Medical): Patient refused  Physical Activity: Unknown  . Days of Exercise per Week: Patient refused  . Minutes of Exercise per Session: Patient refused  Stress: Unknown  . Feeling of Stress : Patient refused  Social Connections: Unknown  . Frequency of Communication with Friends and Family: Patient refused  . Frequency of Social Gatherings with Friends and  Family: Patient refused  . Attends Religious Services: Patient refused  . Active Member of Clubs or Organizations: Patient refused  . Attends Archivist Meetings: Patient refused  . Marital Status: Patient refused  Intimate Partner Violence: Unknown  . Fear of Current or Ex-Partner: Patient refused  . Emotionally Abused: Patient refused  . Physically Abused: Patient refused  . Sexually Abused: Patient refused    FAMILY HISTORY:  Family History  Problem Relation Age of Onset  . Stroke Mother   . Prostate cancer Father   . Bone cancer Father   . Diverticulitis Father   . Rheum arthritis Sister   . Urinary tract infection Sister   .  Colon cancer Neg Hx     CURRENT MEDICATIONS:  Outpatient Encounter Medications as of 07/21/2019  Medication Sig  . acetaminophen (TYLENOL) 500 MG tablet Take 1,000 mg by mouth 3 (three) times daily.   Marland Kitchen allopurinol (ZYLOPRIM) 300 MG tablet Take 300 mg by mouth at bedtime.   Marland Kitchen amLODipine (NORVASC) 2.5 MG tablet Take 1 tablet (2.5 mg total) by mouth daily.  Marland Kitchen Apoaequorin (PREVAGEN) 10 MG CAPS Take 1 capsule by mouth daily.  . cetirizine (ZYRTEC) 10 MG tablet Take 10 mg by mouth daily. IN THE MORNING  . Cholecalciferol (VITAMIN D3) 1.25 MG (50000 UT) CAPS Take 1 capsule by mouth daily.  Marland Kitchen docusate sodium (COLACE) 100 MG capsule Take 100 mg by mouth at bedtime.   . lansoprazole (PREVACID) 15 MG capsule Take 15 mg by mouth daily.   . magnesium oxide (MAG-OX) 400 MG tablet Take 1 tablet (400 mg total) by mouth 3 (three) times daily.  . tamsulosin (FLOMAX) 0.4 MG CAPS capsule Take 0.4 mg by mouth every evening.   . venetoclax 100 MG TABS Take 200 mg by mouth daily.  Marland Kitchen zinc gluconate 50 MG tablet Take 50 mg by mouth daily.  . [DISCONTINUED] magnesium oxide (MAG-OX) 400 MG tablet Take 1 tablet (400 mg total) by mouth 2 (two) times daily.  . diclofenac sodium (VOLTAREN) 1 % GEL Apply 1 g topically 3 (three) times daily as needed (knee pain.).   Marland Kitchen  lidocaine-prilocaine (EMLA) cream Apply 1 application topically See admin instructions. ONE HOUR PRIOR TO CHEMOTHERAPY APPOINTMENT  . traMADol (ULTRAM) 50 MG tablet TAKE 1 TABLET BY MOUTH THREE TIMES DAILY AS NEEDED FOR PAIN (Patient not taking: Reported on 07/21/2019)   No facility-administered encounter medications on file as of 07/21/2019.    ALLERGIES:  No Known Allergies   PHYSICAL EXAM:  ECOG Performance status: 1  Vitals:   07/21/19 0930  BP: 140/76  Pulse: 84  Resp: 18  Temp: (!) 97.2 F (36.2 C)  SpO2: 98%   Filed Weights   07/21/19 0930  Weight: 226 lb (102.5 kg)    Physical Exam Constitutional:      Appearance: Normal appearance.  HENT:     Mouth/Throat:     Mouth: Mucous membranes are moist.     Pharynx: Oropharynx is clear.  Eyes:     Extraocular Movements: Extraocular movements intact.     Conjunctiva/sclera: Conjunctivae normal.  Cardiovascular:     Rate and Rhythm: Normal rate and regular rhythm.     Heart sounds: Normal heart sounds.  Pulmonary:     Effort: Pulmonary effort is normal.     Breath sounds: Normal breath sounds.  Abdominal:     General: There is no distension.     Palpations: Abdomen is soft. There is no mass.  Musculoskeletal:        General: No deformity. Normal range of motion.     Cervical back: Normal range of motion.  Skin:    General: Skin is warm.  Neurological:     General: No focal deficit present.     Mental Status: He is alert and oriented to person, place, and time.  Psychiatric:        Mood and Affect: Mood normal.        Behavior: Behavior normal.      LABORATORY DATA:  I have reviewed the labs as listed.  CBC    Component Value Date/Time   WBC 2.0 (L) 07/21/2019 0937   RBC 4.04 (L)  07/21/2019 0937   HGB 13.6 07/21/2019 0937   HCT 40.8 07/21/2019 0937   PLT 95 (L) 07/21/2019 0937   MCV 101.0 (H) 07/21/2019 0937   MCH 33.7 07/21/2019 0937   MCHC 33.3 07/21/2019 0937   RDW 17.9 (H) 07/21/2019 0937    LYMPHSABS 0.8 07/21/2019 0937   MONOABS 0.2 07/21/2019 0937   EOSABS 0.0 07/21/2019 0937   BASOSABS 0.0 07/21/2019 0937   CMP Latest Ref Rng & Units 07/21/2019 07/07/2019 06/30/2019  Glucose 70 - 99 mg/dL 112(H) 121(H) 128(H)  BUN 8 - 23 mg/dL 24(H) 21 20  Creatinine 0.61 - 1.24 mg/dL 0.96 1.02 0.97  Sodium 135 - 145 mmol/L 140 143 140  Potassium 3.5 - 5.1 mmol/L 4.0 3.7 3.7  Chloride 98 - 111 mmol/L 107 109 108  CO2 22 - 32 mmol/L '24 25 25  ' Calcium 8.9 - 10.3 mg/dL 9.5 9.2 9.4  Total Protein 6.5 - 8.1 g/dL 6.4(L) 6.2(L) 6.1(L)  Total Bilirubin 0.3 - 1.2 mg/dL 0.7 0.6 0.6  Alkaline Phos 38 - 126 U/L 61 64 57  AST 15 - 41 U/L '22 25 20  ' ALT 0 - 44 U/L 26 38 27        ASSESSMENT & PLAN:   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HCC) 1.  AML: -5 cycles of decitabine and venetoclax from 01/20/2019 through 07/07/2019. -Bone marrow biopsy on 03/05/2019 after cycle 1 did not show any evidence of leukemia. -We reviewed labs from today.  White count is low at 2.0 with 50% neutrophils.  Platelet count is 95. -I have recommended stopping venetoclax at this time.  I plan to reevaluate him in 2 weeks for his next cycle of treatment.  2.  CKD: -Creatinine baseline is between 1.1 and 1.5.  3.  Hypomagnesemia: -He is taking magnesium twice daily.  His magnesium is low at 1.6. -We will increase his magnesium to 3 times a day.  4.  Hypertension: -Lisinopril is on hold.  He will continue Norvasc 2.5 mg as needed.  Blood pressure today is 140/76.  5.  Bilateral knee pains: -She will continue Tylenol and tramadol as needed.    Orders placed this encounter:  No orders of the defined types were placed in this encounter.     [name], MD [place] [phone]

## 2019-07-21 NOTE — Progress Notes (Signed)
1050 Labs reviewed with and pt seen by Dr. Delton Coombes and pt instructed to increase Magnesium to TID and stop taking Venetoclax until next appt per MD orders. Pt verbalized understanding and was discharged self ambulatory using his walker in satisfactory condition

## 2019-07-30 ENCOUNTER — Other Ambulatory Visit (HOSPITAL_COMMUNITY): Payer: Medicare Other

## 2019-07-30 ENCOUNTER — Ambulatory Visit (HOSPITAL_COMMUNITY): Payer: Medicare Other | Admitting: Hematology

## 2019-08-04 ENCOUNTER — Other Ambulatory Visit (HOSPITAL_COMMUNITY): Payer: Self-pay | Admitting: *Deleted

## 2019-08-04 DIAGNOSIS — C9200 Acute myeloblastic leukemia, not having achieved remission: Secondary | ICD-10-CM

## 2019-08-05 NOTE — Progress Notes (Signed)

## 2019-08-06 ENCOUNTER — Other Ambulatory Visit: Payer: Self-pay

## 2019-08-06 ENCOUNTER — Inpatient Hospital Stay (HOSPITAL_BASED_OUTPATIENT_CLINIC_OR_DEPARTMENT_OTHER): Payer: Medicare Other | Admitting: Hematology

## 2019-08-06 ENCOUNTER — Inpatient Hospital Stay (HOSPITAL_COMMUNITY): Payer: Medicare Other

## 2019-08-06 ENCOUNTER — Encounter (HOSPITAL_COMMUNITY): Payer: Self-pay | Admitting: Hematology

## 2019-08-06 DIAGNOSIS — C9200 Acute myeloblastic leukemia, not having achieved remission: Secondary | ICD-10-CM

## 2019-08-06 DIAGNOSIS — Z5111 Encounter for antineoplastic chemotherapy: Secondary | ICD-10-CM | POA: Diagnosis not present

## 2019-08-06 LAB — COMPREHENSIVE METABOLIC PANEL
ALT: 35 U/L (ref 0–44)
AST: 20 U/L (ref 15–41)
Albumin: 3.9 g/dL (ref 3.5–5.0)
Alkaline Phosphatase: 63 U/L (ref 38–126)
Anion gap: 9 (ref 5–15)
BUN: 22 mg/dL (ref 8–23)
CO2: 28 mmol/L (ref 22–32)
Calcium: 9.7 mg/dL (ref 8.9–10.3)
Chloride: 104 mmol/L (ref 98–111)
Creatinine, Ser: 1.08 mg/dL (ref 0.61–1.24)
GFR calc Af Amer: >60 mL/min (ref >60)
GFR calc non Af Amer: >60 mL/min (ref >60)
Glucose, Bld: 114 mg/dL — ABNORMAL HIGH (ref 70–99)
Potassium: 4.6 mmol/L (ref 3.5–5.1)
Sodium: 141 mmol/L (ref 135–145)
Total Bilirubin: 0.7 mg/dL (ref 0.3–1.2)
Total Protein: 6.4 g/dL — ABNORMAL LOW (ref 6.5–8.1)

## 2019-08-06 LAB — CBC WITH DIFFERENTIAL/PLATELET
Abs Immature Granulocytes: 0.09 10*3/uL — ABNORMAL HIGH (ref 0.00–0.07)
Basophils Absolute: 0.1 10*3/uL (ref 0.0–0.1)
Basophils Relative: 2 %
Eosinophils Absolute: 0 10*3/uL (ref 0.0–0.5)
Eosinophils Relative: 0 %
HCT: 44.1 % (ref 39.0–52.0)
Hemoglobin: 14.5 g/dL (ref 13.0–17.0)
Immature Granulocytes: 4 %
Lymphocytes Relative: 59 %
Lymphs Abs: 1.4 10*3/uL (ref 0.7–4.0)
MCH: 33.7 pg (ref 26.0–34.0)
MCHC: 32.9 g/dL (ref 30.0–36.0)
MCV: 102.6 fL — ABNORMAL HIGH (ref 80.0–100.0)
Monocytes Absolute: 0.5 10*3/uL (ref 0.1–1.0)
Monocytes Relative: 20 %
Neutro Abs: 0.3 10*3/uL — ABNORMAL LOW (ref 1.7–7.7)
Neutrophils Relative %: 15 %
Platelets: 243 10*3/uL (ref 150–400)
RBC: 4.3 MIL/uL (ref 4.22–5.81)
RDW: 17.2 % — ABNORMAL HIGH (ref 11.5–15.5)
WBC: 2.3 10*3/uL — ABNORMAL LOW (ref 4.0–10.5)
nRBC: 0 % (ref 0.0–0.2)

## 2019-08-06 LAB — LACTATE DEHYDROGENASE: LDH: 139 U/L (ref 98–192)

## 2019-08-06 LAB — MAGNESIUM: Magnesium: 1.8 mg/dL (ref 1.7–2.4)

## 2019-08-06 NOTE — Patient Instructions (Addendum)
[place] at [facility] Discharge Instructions  You were seen today by Dr. Delton Coombes. He went over your recent lab results. Your labs were still a little low so he will change your treatment to the 12th of April. Stay off of the Venetoclax until you see [place] back. He will see you back on the 12th for labs, treatment and follow up.   Thank you for choosing [place] at [facility] to provide your oncology and hematology care.  To afford each patient quality time with our provider, please arrive at least 15 minutes before your scheduled appointment time.   If you have a lab appointment with the [place] please come in thru the  Main Entrance and check in at the main information desk  You need to re-schedule your appointment should you arrive 10 or more minutes late.  We strive to give you quality time with our providers, and arriving late affects you and other patients whose appointments are after yours.  Also, if you no show three or more times for appointments you may be dismissed from the clinic at the providers discretion.     Again, thank you for choosing [facility].  Our hope is that these requests will decrease the amount of time that you wait before being seen by our physicians.       _____________________________________________________________  Should you have questions after your visit to [facility], please contact our office at [phone] between the hours of 8:00 a.m. and 4:30 p.m.  Voicemails left after 4:00 p.m. will not be returned until the following business day.  For prescription refill requests, have your pharmacy contact our office and allow 72 hours.    Cancer Center Support Programs:   > Cancer Support Group  2nd Tuesday of the month 1pm-2pm, Journey Room

## 2019-08-06 NOTE — Progress Notes (Signed)
[place] [facility] [address], [place] [address]   CLINIC:  Medical Oncology/Hematology  PCP:  [name], MD [place] [id number] [phone]   REASON FOR VISIT:  Follow-up for newly diagnosed AML.  CURRENT THERAPY: Decitabine and venetoclax  BRIEF ONCOLOGIC HISTORY:  Oncology History  MDS (myelodysplastic syndrome), high grade (HCC)  08/14/2018 Initial Diagnosis   MDS (myelodysplastic syndrome), high grade ([facility])   08/22/2018 - 12/31/2018 Chemotherapy   The patient had palonosetron (ALOXI) injection 0.25 mg, 0.25 mg, Intravenous,  Once, 4 of 6 cycles Administration: 0.25 mg (08/22/2018), 0.25 mg (08/26/2018), 0.25 mg (08/28/2018), 0.25 mg (08/30/2018), 0.25 mg (10/01/2018), 0.25 mg (10/02/2018), 0.25 mg (11/04/2018), 0.25 mg (09/23/2018), 0.25 mg (09/25/2018), 0.25 mg (09/27/2018), 0.25 mg (10/28/2018), 0.25 mg (10/30/2018), 0.25 mg (11/01/2018), 0.25 mg (12/02/2018), 0.25 mg (12/04/2018), 0.25 mg (12/06/2018) azaCITIDine (VIDAZA) 100 mg in sodium chloride 0.9 % 50 mL chemo infusion, 110 mg (66.7 % of original dose 75 mg/m2), Intravenous, Once, 4 of 6 cycles Dose modification: 50 mg/m2 (original dose 75 mg/m2, Cycle 1, Reason: Provider Judgment), 50 mg/m2 (original dose 75 mg/m2, Cycle 2, Reason: Provider Judgment) Administration: 100 mg (08/22/2018), 100 mg (08/23/2018), 100 mg (08/26/2018), 100 mg (08/27/2018), 100 mg (08/28/2018), 100 mg (08/29/2018), 100 mg (08/30/2018), 100 mg (10/01/2018), 100 mg (10/02/2018), 100 mg (11/04/2018), 100 mg (11/05/2018), 100 mg (09/23/2018), 100 mg (09/24/2018), 100 mg (09/25/2018), 100 mg (09/26/2018), 100 mg (09/27/2018), 100 mg (10/28/2018), 100 mg (10/29/2018), 100 mg (10/30/2018), 100 mg (10/31/2018), 100 mg (11/01/2018), 100 mg (12/02/2018), 100 mg (12/03/2018), 100 mg (12/04/2018), 100 mg (12/05/2018), 100 mg (12/06/2018)  for chemotherapy treatment.    01/20/2019 -  Chemotherapy   The patient had decitabine (DACOGEN) 45 mg in sodium chloride 0.9 %  50 mL chemo infusion, 20 mg/m2 = 45 mg, Intravenous,  Once, 5 of 6 cycles Dose modification: 15 mg/m2 (original dose 20 mg/m2, Cycle 2, Reason: Other (see comments), Comment: neutropenic fever) Administration: 45 mg (01/20/2019), 45 mg (01/21/2019), 45 mg (01/22/2019), 45 mg (01/23/2019), 45 mg (01/24/2019), 35 mg (03/17/2019), 35 mg (03/18/2019), 35 mg (03/19/2019), 35 mg (03/20/2019), 35 mg (03/21/2019), 35 mg (04/22/2019), 35 mg (04/23/2019), 35 mg (04/24/2019), 35 mg (04/25/2019), 35 mg (05/26/2019), 35 mg (05/27/2019), 35 mg (05/28/2019), 35 mg (05/29/2019), 35 mg (05/30/2019), 35 mg (07/07/2019), 35 mg (07/08/2019), 35 mg (07/09/2019), 35 mg (07/10/2019), 35 mg (07/11/2019) ondansetron (ZOFRAN) 8 mg in sodium chloride 0.9 % 50 mL IVPB, 8 mg (100 % of original dose 8 mg), Intravenous,  Once, 5 of 6 cycles Dose modification: 8 mg (original dose 8 mg, Cycle 1) Administration: 8 mg (01/20/2019), 8 mg (01/21/2019), 8 mg (01/22/2019), 8 mg (01/23/2019), 8 mg (01/24/2019), 8 mg (03/17/2019), 8 mg (03/18/2019), 8 mg (03/19/2019), 8 mg (03/20/2019), 8 mg (03/21/2019), 8 mg (04/22/2019), 8 mg (04/23/2019), 8 mg (04/24/2019), 8 mg (04/25/2019), 8 mg (05/26/2019), 8 mg (05/27/2019), 8 mg (05/28/2019), 8 mg (05/29/2019), 8 mg (05/30/2019), 8 mg (07/07/2019), 8 mg (07/08/2019), 8 mg (07/09/2019), 8 mg (07/10/2019), 8 mg (07/11/2019)  for chemotherapy treatment.    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[facility])  01/07/2019 Initial Diagnosis   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[facility])   01/20/2019 -  Chemotherapy   The patient had decitabine (DACOGEN) 45 mg in sodium chloride 0.9 % 50 mL chemo infusion, 20 mg/m2 = 45 mg, Intravenous,  Once, 5 of 6 cycles Dose modification: 15 mg/m2 (original dose 20 mg/m2, Cycle 2, Reason: Other (see comments), Comment: neutropenic fever) Administration:  45 mg (01/20/2019), 45 mg (01/21/2019), 45 mg (01/22/2019), 45 mg (01/23/2019), 45 mg (01/24/2019), 35 mg (03/17/2019), 35 mg (03/18/2019), 35 mg (03/19/2019), 35 mg (03/20/2019), 35 mg  (03/21/2019), 35 mg (04/22/2019), 35 mg (04/23/2019), 35 mg (04/24/2019), 35 mg (04/25/2019), 35 mg (05/26/2019), 35 mg (05/27/2019), 35 mg (05/28/2019), 35 mg (05/29/2019), 35 mg (05/30/2019), 35 mg (07/07/2019), 35 mg (07/08/2019), 35 mg (07/09/2019), 35 mg (07/10/2019), 35 mg (07/11/2019) ondansetron (ZOFRAN) 8 mg in sodium chloride 0.9 % 50 mL IVPB, 8 mg (100 % of original dose 8 mg), Intravenous,  Once, 5 of 6 cycles Dose modification: 8 mg (original dose 8 mg, Cycle 1) Administration: 8 mg (01/20/2019), 8 mg (01/21/2019), 8 mg (01/22/2019), 8 mg (01/23/2019), 8 mg (01/24/2019), 8 mg (03/17/2019), 8 mg (03/18/2019), 8 mg (03/19/2019), 8 mg (03/20/2019), 8 mg (03/21/2019), 8 mg (04/22/2019), 8 mg (04/23/2019), 8 mg (04/24/2019), 8 mg (04/25/2019), 8 mg (05/26/2019), 8 mg (05/27/2019), 8 mg (05/28/2019), 8 mg (05/29/2019), 8 mg (05/30/2019), 8 mg (07/07/2019), 8 mg (07/08/2019), 8 mg (07/09/2019), 8 mg (07/10/2019), 8 mg (07/11/2019)  for chemotherapy treatment.       CANCER STAGING: Cancer Staging No matching staging information was found for the patient.   INTERVAL HISTORY:  Richard Wallace 74 y.o. male seen for follow-up and toxicity assessment prior to next cycle of chemotherapy for AML.  Appetite is 100%.  Energy levels are 25%.  Pain in the knees is reported as 4 out of 10.  He is taking tramadol and Tylenol as needed.  He is also continue magnesium 3 times a day.  Ankle swellings have been stable.  REVIEW OF SYSTEMS:  Review of Systems  Cardiovascular: Positive for leg swelling.  Musculoskeletal: Positive for arthralgias.  All other systems reviewed and are negative.    PAST MEDICAL/SURGICAL HISTORY:  Past Medical History:  Diagnosis Date  . Arthritis   . 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 Cancer ([facility]) 01-28-2014   skin cancer  . CKD (chronic kidney disease), stage III   . GERD (gastroesophageal reflux disease)   . Heart murmur    NOTED DURING PHYSICAL WHEN HE WAS ENLISTING IN [facility] , DIDNT  KNOW UNTIL THAT TIME AND REPORTS , "THATS THE LAST I HEARD ABOUT IT "   . History of hypertension    no longer issue  . History of kidney stones   . History of malignant melanoma of skin    excision top of scalp 2015-- no recurrence  . History of urinary retention    post op lumbar fusion surgery 04/ 2016  . Hypertension   . 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 Left ureteral calculus   . Seasonal allergies   . Wears glasses   . Wears glasses   . Wears partial dentures    upper and lower   Past Surgical History:  Procedure Laterality Date  . ANKLE FUSION Right 2007  . CARPAL TUNNEL RELEASE Left 12/28/2009   w/ pulley release left long finger  . CARPAL TUNNEL RELEASE Right 07/22/2013   Procedure: RIGHT CARPAL TUNNEL RELEASE;  Surgeon: Cammie Sickle., MD;  Location: [facility];  Service: Orthopedics;  Laterality: Right;  . COLONOSCOPY    . CYSTO/  LEFT RETROGRADE PYELOGRAM  11/21/2010  . CYSTOSCOPY WITH STENT PLACEMENT Left 03/09/2016   Procedure: CYSTOSCOPY WITH STENT PLACEMENT;  Surgeon: Franchot Gallo, MD;  Location: [facility];  Service: Urology;  Laterality: Left;  . CYSTOSCOPY/RETROGRADE/URETEROSCOPY/STONE EXTRACTION WITH BASKET Left 03/09/2016   Procedure: CYSTOSCOPY/RETROGRADE/URETEROSCOPY/STONE EXTRACTION WITH BASKET;  Surgeon: Franchot Gallo, MD;  Location: [facility];  Service: Urology;  Laterality: Left;  . LEFT URETEROSCOPIC LASER LITHOTRIPSY STONE EXTRACTION/ STENT PLACEMENT  05/23/2010  . MOHS SURGERY     TOP OF THE HEAD   . ORIF ANKLE FRACTURE Right 1978  . PORT-A-CATH REMOVAL Right 02/14/2019   Procedure: MINOR REMOVAL PORT-A-CATH;  Surgeon: Aviva Signs, MD;  Location: [facility];  Service: General;  Laterality: Right;  . PORTACATH PLACEMENT Right 08/19/2018   Procedure: INSERTION PORT-A-CATH (attached catheter in right subclavian);  Surgeon:  Aviva Signs, MD;  Location: [facility];  Service: General;  Laterality: Right;  . PORTACATH PLACEMENT Left 05/16/2019   Procedure: INSERTION PORT-A-CATH (attached catheter in left subclavian);  Surgeon: Aviva Signs, MD;  Location: [facility];  Service: General;  Laterality: Left;  . POSTERIOR LUMBAR FUSION  08/21/2014   laminectomy and decompression L2 -- L5  . RIGHT LOWER LEG SURGERY  X3  1975 to 1976   including ORIF  . TONSILLECTOMY AND ADENOIDECTOMY  1986  . UMBILICAL HERNIA REPAIR  2009 approx     SOCIAL HISTORY:  Social History   Socioeconomic History  . Marital status: Married    Spouse name: Not on file  . Number of children: Not on file  . Years of education: Not on file  . Highest education level: Not on file  Occupational History  . Not on file  Tobacco Use  . Smoking status: Former Smoker    Years: 20.00    Types: Cigarettes    Quit date: 07/17/1986    Years since quitting: 33.0  . Smokeless tobacco: Never Used  Substance and Sexual Activity  . Alcohol use: Yes    Alcohol/week: 7.0 - 14.0 standard drinks    Types: 7 - 14 Cans of beer per week    Comment: 1 -2 beer daily  . Drug use: No  . Sexual activity: Not Currently  Other Topics Concern  . Not on file  Social History Narrative  . Not on file   Social Determinants of Health   Financial Resource Strain: Unknown  . Difficulty of Paying Living Expenses: Patient refused  Food Insecurity: Unknown  . Worried About Charity fundraiser in the Last Year: Patient refused  . Ran Out of Food in the Last Year: Patient refused  Transportation Needs: Unknown  . Lack of Transportation (Medical): Patient refused  . Lack of Transportation (Non-Medical): Patient refused  Physical Activity: Unknown  . Days of Exercise per Week: Patient refused  . Minutes of Exercise per Session: Patient refused  Stress: Unknown  . Feeling of Stress : Patient refused  Social Connections: Unknown  . Frequency of Communication with Friends  and Family: Patient refused  . Frequency of Social Gatherings with Friends and Family: Patient refused  . Attends Religious Services: Patient refused  . Active Member of Clubs or Organizations: Patient refused  . Attends Archivist Meetings: Patient refused  . Marital Status: Patient refused  Intimate Partner Violence: Unknown  . Fear of Current or Ex-Partner: Patient refused  . Emotionally Abused: Patient refused  . Physically Abused: Patient refused  . Sexually Abused: Patient refused    FAMILY HISTORY:  Family History  Problem Relation Age of Onset  . Stroke Mother   . Prostate cancer Father   . Bone cancer Father   .  Diverticulitis Father   . Rheum arthritis Sister   . Urinary tract infection Sister   . Colon cancer Neg Hx     CURRENT MEDICATIONS:  Outpatient Encounter Medications as of 08/06/2019  Medication Sig  . traMADol (ULTRAM) 50 MG tablet TAKE 1 TABLET BY MOUTH THREE TIMES DAILY AS NEEDED FOR PAIN  . acetaminophen (TYLENOL) 500 MG tablet Take 1,000 mg by mouth 3 (three) times daily.   Mar[place] Kitchen allopurinol (ZYLOPRIM) 300 MG tablet Take 300 mg by mouth at bedtime.   Marland Kitchen amLODipine (NORVASC) 2.5 MG tablet Take 1 tablet (2.5 mg total) by mouth daily.  Marland Kitchen Apoaequorin (PREVAGEN) 10 MG CAPS Take 1 capsule by mouth daily.  . cetirizine (ZYRTEC) 10 MG tablet Take 10 mg by mouth daily. IN THE MORNING  . Cholecalciferol (VITAMIN D3) 1.25 MG (50000 UT) CAPS Take 1 capsule by mouth daily.  . diclofenac sodium (VOLTAREN) 1 % GEL Apply 1 g topically 3 (three) times daily as needed (knee pain.).   Marland Kitchen docusate sodium (COLACE) 100 MG capsule Take 100 mg by mouth at bedtime.   . lansoprazole (PREVACID) 15 MG capsule Take 15 mg by mouth daily.   Marland Kitchen lidocaine-prilocaine (EMLA) cream Apply 1 application topically See admin instructions. ONE HOUR PRIOR TO CHEMOTHERAPY APPOINTMENT  . magnesium oxide (MAG-OX) 400 MG tablet Take 1 tablet (400 mg total) by mouth 3 (three) times daily.  .  tamsulosin (FLOMAX) 0.4 MG CAPS capsule Take 0.4 mg by mouth every evening.   . venetoclax 100 MG TABS Take 200 mg by mouth daily.  Marland Kitchen zinc gluconate 50 MG tablet Take 50 mg by mouth daily.  . [DISCONTINUED] magnesium oxide (MAG-OX) 400 (241.3 Mg) MG tablet Take 1 tablet by mouth 3 (three) times daily.   Facility-Administered Encounter Medications as of 08/06/2019  Medication  . sodium chloride flush (NS) 0.9 % injection 20 mL    ALLERGIES:  No Known Allergies   PHYSICAL EXAM:  ECOG Performance status: 1  Vitals:   08/06/19 1138  BP: 125/85  Pulse: 85  Resp: 16  Temp: (!) 96.4 F (35.8 C)  SpO2: 99%   Filed Weights   08/06/19 1138  Weight: 226 lb (102.5 kg)    Physical Exam Constitutional:      Appearance: Normal appearance.  Eyes:     Extraocular Movements: Extraocular movements intact.     Conjunctiva/sclera: Conjunctivae normal.  Cardiovascular:     Rate and Rhythm: Normal rate and regular rhythm.     Heart sounds: Normal heart sounds.  Pulmonary:     Effort: Pulmonary effort is normal.     Breath sounds: Normal breath sounds.  Abdominal:     General: There is no distension.     Palpations: Abdomen is soft. There is no mass.  Musculoskeletal:        General: No deformity. Normal range of motion.     Cervical back: Normal range of motion.     Right lower leg: Edema present.     Left lower leg: Edema present.  Skin:    General: Skin is warm.  Neurological:     General: No focal deficit present.     Mental Status: He is alert and oriented to person, place, and time.  Psychiatric:        Mood and Affect: Mood normal.        Behavior: Behavior normal.      LABORATORY DATA:  I have reviewed the labs as listed.  CBC  Component Value Date/Time   WBC 2.3 (L) 08/06/2019 1041   RBC 4.30 08/06/2019 1041   HGB 14.5 08/06/2019 1041   HCT 44.1 08/06/2019 1041   PLT 243 08/06/2019 1041   MCV 102.6 (H) 08/06/2019 1041   MCH 33.7 08/06/2019 1041   MCHC 32.9  08/06/2019 1041   RDW 17.2 (H) 08/06/2019 1041   LYMPHSABS 1.4 08/06/2019 1041   MONOABS 0.5 08/06/2019 1041   EOSABS 0.0 08/06/2019 1041   BASOSABS 0.1 08/06/2019 1041   CMP Latest Ref Rng & Units 08/06/2019 07/21/2019 07/07/2019  Glucose 70 - 99 mg/dL 114(H) 112(H) 121(H)  BUN 8 - 23 mg/dL 22 24(H) 21  Creatinine 0.61 - 1.24 mg/dL 1.08 0.96 1.02  Sodium 135 - 145 mmol/L 141 140 143  Potassium 3.5 - 5.1 mmol/L 4.6 4.0 3.7  Chloride 98 - 111 mmol/L 104 107 109  CO2 22 - 32 mmol/L '28 24 25  ' Calcium 8.9 - 10.3 mg/dL 9.7 9.5 9.2  Total Protein 6.5 - 8.1 g/dL 6.4(L) 6.4(L) 6.2(L)  Total Bilirubin 0.3 - 1.2 mg/dL 0.7 0.7 0.6  Alkaline Phos 38 - 126 U/L 63 61 64  AST 15 - 41 U/L '20 22 25  ' ALT 0 - 44 U/L 35 26 38        ASSESSMENT & PLAN:   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[facility]) 1.  Acute myeloid leukemia: -5 cycles of decitabine and venetoclax from 01/20/2019 through 07/07/2019. -Bone marrow biopsy on 02/25/2019 after cycle 1 did not show any evidence of leukemia. -He takes venetoclax 200 mg for 2 weeks with the cycle.  He has been off of it at this time. -We reviewed labs from today.  White count is low with ANC of 300. -He is not ready for next cycle next week.  He will continue to hold venetoclax.  I will reevaluate him on 08/18/2019.  2.  CKD: -Creatinine baseline is between 1.1-1.5.  It is stable today.  3.  Hypomagnesemia: -Magnesium is 1.8 today.  He will continue magnesium 3 times a day.  4.  Hypertension: -Lisinopril is on hold.  Blood pressure is 125/85.  He will continue Norvasc 2.5 mg as needed.  5.  Bilateral knee pains: -He will continue tramadol and Tylenol as needed.    Orders placed this encounter:  No orders of the defined types were placed in this encounter.     [name], MD [place] [phone]

## 2019-08-06 NOTE — Assessment & Plan Note (Addendum)
1.  Acute myeloid leukemia: -5 cycles of decitabine and venetoclax from 01/20/2019 through 07/07/2019. -Bone marrow biopsy on 02/25/2019 after cycle 1 did not show any evidence of leukemia. -He takes venetoclax 200 mg for 2 weeks with the cycle.  He has been off of it at this time. -We reviewed labs from today.  White count is low with ANC of 300. -He is not ready for next cycle next week.  He will continue to hold venetoclax.  I will reevaluate him on 08/18/2019.  2.  CKD: -Creatinine baseline is between 1.1-1.5.  It is stable today.  3.  Hypomagnesemia: -Magnesium is 1.8 today.  He will continue magnesium 3 times a day.  4.  Hypertension: -Lisinopril is on hold.  Blood pressure is 125/85.  He will continue Norvasc 2.5 mg as needed.  5.  Bilateral knee pains: -He will continue tramadol and Tylenol as needed.

## 2019-08-07 ENCOUNTER — Other Ambulatory Visit (HOSPITAL_COMMUNITY): Payer: Self-pay | Admitting: Nurse Practitioner

## 2019-08-07 DIAGNOSIS — M25559 Pain in unspecified hip: Secondary | ICD-10-CM

## 2019-08-11 ENCOUNTER — Ambulatory Visit (HOSPITAL_COMMUNITY): Payer: Medicare Other

## 2019-08-11 ENCOUNTER — Ambulatory Visit (HOSPITAL_COMMUNITY): Payer: Medicare Other | Admitting: Hematology

## 2019-08-11 ENCOUNTER — Other Ambulatory Visit (HOSPITAL_COMMUNITY): Payer: Medicare Other

## 2019-08-12 ENCOUNTER — Ambulatory Visit (HOSPITAL_COMMUNITY): Payer: Medicare Other

## 2019-08-12 NOTE — Progress Notes (Signed)

## 2019-08-13 ENCOUNTER — Ambulatory Visit (HOSPITAL_COMMUNITY): Payer: Medicare Other

## 2019-08-14 ENCOUNTER — Ambulatory Visit (HOSPITAL_COMMUNITY): Payer: Medicare Other

## 2019-08-15 ENCOUNTER — Ambulatory Visit (HOSPITAL_COMMUNITY): Payer: Medicare Other

## 2019-08-15 ENCOUNTER — Other Ambulatory Visit (HOSPITAL_COMMUNITY): Payer: Self-pay | Admitting: Nurse Practitioner

## 2019-08-15 DIAGNOSIS — M25559 Pain in unspecified hip: Secondary | ICD-10-CM

## 2019-08-18 ENCOUNTER — Other Ambulatory Visit: Payer: Self-pay

## 2019-08-18 ENCOUNTER — Encounter (HOSPITAL_COMMUNITY): Payer: Self-pay | Admitting: Hematology

## 2019-08-18 ENCOUNTER — Inpatient Hospital Stay (HOSPITAL_COMMUNITY): Payer: Medicare Other

## 2019-08-18 ENCOUNTER — Inpatient Hospital Stay (HOSPITAL_COMMUNITY): Payer: Medicare Other | Attending: Hematology

## 2019-08-18 ENCOUNTER — Inpatient Hospital Stay (HOSPITAL_BASED_OUTPATIENT_CLINIC_OR_DEPARTMENT_OTHER): Payer: Medicare Other | Admitting: Hematology

## 2019-08-18 VITALS — BP 141/86 | HR 64 | Temp 97.7°F | Resp 18 | Wt 225.2 lb

## 2019-08-18 DIAGNOSIS — I129 Hypertensive chronic kidney disease with stage 1 through stage 4 chronic kidney disease, or unspecified chronic kidney disease: Secondary | ICD-10-CM | POA: Diagnosis not present

## 2019-08-18 DIAGNOSIS — C9200 Acute myeloblastic leukemia, not having achieved remission: Secondary | ICD-10-CM | POA: Diagnosis present

## 2019-08-18 DIAGNOSIS — Z79899 Other long term (current) drug therapy: Secondary | ICD-10-CM | POA: Insufficient documentation

## 2019-08-18 DIAGNOSIS — D46Z Other myelodysplastic syndromes: Secondary | ICD-10-CM

## 2019-08-18 DIAGNOSIS — Z5111 Encounter for antineoplastic chemotherapy: Secondary | ICD-10-CM | POA: Diagnosis not present

## 2019-08-18 DIAGNOSIS — Z95828 Presence of other vascular implants and grafts: Secondary | ICD-10-CM

## 2019-08-18 DIAGNOSIS — N183 Chronic kidney disease, stage 3 unspecified: Secondary | ICD-10-CM | POA: Insufficient documentation

## 2019-08-18 LAB — COMPREHENSIVE METABOLIC PANEL
ALT: 30 U/L (ref 0–44)
AST: 20 U/L (ref 15–41)
Albumin: 3.8 g/dL (ref 3.5–5.0)
Alkaline Phosphatase: 59 U/L (ref 38–126)
Anion gap: 9 (ref 5–15)
BUN: 32 mg/dL — ABNORMAL HIGH (ref 8–23)
CO2: 25 mmol/L (ref 22–32)
Calcium: 9.5 mg/dL (ref 8.9–10.3)
Chloride: 107 mmol/L (ref 98–111)
Creatinine, Ser: 1.16 mg/dL (ref 0.61–1.24)
GFR calc Af Amer: >60 mL/min (ref >60)
GFR calc non Af Amer: >60 mL/min (ref >60)
Glucose, Bld: 87 mg/dL (ref 70–99)
Potassium: 4.1 mmol/L (ref 3.5–5.1)
Sodium: 141 mmol/L (ref 135–145)
Total Bilirubin: 0.8 mg/dL (ref 0.3–1.2)
Total Protein: 6.4 g/dL — ABNORMAL LOW (ref 6.5–8.1)

## 2019-08-18 LAB — CBC WITH DIFFERENTIAL/PLATELET
Abs Immature Granulocytes: 0.03 10*3/uL (ref 0.00–0.07)
Basophils Absolute: 0.1 10*3/uL (ref 0.0–0.1)
Basophils Relative: 1 %
Eosinophils Absolute: 0 10*3/uL (ref 0.0–0.5)
Eosinophils Relative: 1 %
HCT: 44 % (ref 39.0–52.0)
Hemoglobin: 14.1 g/dL (ref 13.0–17.0)
Immature Granulocytes: 1 %
Lymphocytes Relative: 30 %
Lymphs Abs: 1.2 10*3/uL (ref 0.7–4.0)
MCH: 33.6 pg (ref 26.0–34.0)
MCHC: 32 g/dL (ref 30.0–36.0)
MCV: 104.8 fL — ABNORMAL HIGH (ref 80.0–100.0)
Monocytes Absolute: 0.6 10*3/uL (ref 0.1–1.0)
Monocytes Relative: 14 %
Neutro Abs: 2.2 10*3/uL (ref 1.7–7.7)
Neutrophils Relative %: 53 %
Platelets: 154 10*3/uL (ref 150–400)
RBC: 4.2 MIL/uL — ABNORMAL LOW (ref 4.22–5.81)
RDW: 16.4 % — ABNORMAL HIGH (ref 11.5–15.5)
WBC: 4.2 10*3/uL (ref 4.0–10.5)
nRBC: 0 % (ref 0.0–0.2)

## 2019-08-18 LAB — LACTATE DEHYDROGENASE: LDH: 149 U/L (ref 98–192)

## 2019-08-18 LAB — MAGNESIUM: Magnesium: 1.8 mg/dL (ref 1.7–2.4)

## 2019-08-18 MED ORDER — HEPARIN SOD (PORK) LOCK FLUSH 100 UNIT/ML IV SOLN
500.0000 [IU] | Freq: Once | INTRAVENOUS | Status: AC | PRN
  Administered 2019-08-18: 500 [IU]

## 2019-08-18 MED ORDER — SODIUM CHLORIDE 0.9 % IV SOLN
15.0000 mg/m2 | Freq: Once | INTRAVENOUS | Status: AC
  Administered 2019-08-18: 35 mg via INTRAVENOUS
  Filled 2019-08-18: qty 7

## 2019-08-18 MED ORDER — SODIUM CHLORIDE 0.9% FLUSH
10.0000 mL | INTRAVENOUS | Status: DC | PRN
  Administered 2019-08-18: 10 mL

## 2019-08-18 MED ORDER — SODIUM CHLORIDE 0.9 % IV SOLN: Freq: Once | INTRAVENOUS | Status: AC

## 2019-08-18 MED ORDER — SODIUM CHLORIDE 0.9 % IV SOLN
8.0000 mg | Freq: Once | INTRAVENOUS | Status: AC
  Administered 2019-08-18: 8 mg via INTRAVENOUS
  Filled 2019-08-18: qty 4

## 2019-08-18 NOTE — Assessment & Plan Note (Signed)
1.  Acute myeloid leukemia: -5 cycles of decitabine and venetoclax from 01/20/2019 through 07/07/2019. -Bone marrow biopsy on 02/25/2019 after cycle 1 did not show any evidence of leukemia. -He takes venetoclax 200 mg for 2 weeks with each cycle. -He is receiving decitabine every 6 weeks on average. -I have reviewed his CBC which shows improvement in white count normal.  Platelet count is normal.  He will proceed with his cycle 6 today. -He will start venetoclax 200 mg daily today.  I will see him back in 2 weeks for follow-up with repeat labs.  2.  CKD: -Baseline creatinine is between 1.1-1.5.  It is 1.16 today.  3.  Hypomagnesemia: -He will continue magnesium 3 times a day.  His magnesium today is 1.8.  4.  Hypertension: -Blood pressure today is 129/72.  Lisinopril are on hold.  He will continue Norvasc 2.5 mg as needed.  5.  Bilateral knee pains: -He will continue tramadol and Tylenol as needed. 

## 2019-08-18 NOTE — Progress Notes (Signed)
Richard Wallace [address] [place] [address]   CLINIC:  Medical Oncology/Hematology  PCP:  [name], MD [place] [id number] [phone]   REASON FOR VISIT:  Follow-up for newly diagnosed AML.  CURRENT THERAPY: Decitabine and venetoclax  BRIEF ONCOLOGIC HISTORY:  Oncology History  MDS (myelodysplastic syndrome), high grade (HCC)  08/14/2018 Initial Diagnosis   MDS (myelodysplastic syndrome), high grade ([place])   08/22/2018 - 12/31/2018 Chemotherapy   The patient had palonosetron (ALOXI) injection 0.25 mg, 0.25 mg, Intravenous,  Once, 4 of 6 cycles Administration: 0.25 mg (08/22/2018), 0.25 mg (08/26/2018), 0.25 mg (08/28/2018), 0.25 mg (08/30/2018), 0.25 mg (10/01/2018), 0.25 mg (10/02/2018), 0.25 mg (11/04/2018), 0.25 mg (09/23/2018), 0.25 mg (09/25/2018), 0.25 mg (09/27/2018), 0.25 mg (10/28/2018), 0.25 mg (10/30/2018), 0.25 mg (11/01/2018), 0.25 mg (12/02/2018), 0.25 mg (12/04/2018), 0.25 mg (12/06/2018) azaCITIDine (VIDAZA) 100 mg in sodium chloride 0.9 % 50 mL chemo infusion, 110 mg (66.7 % of original dose 75 mg/m2), Intravenous, Once, 4 of 6 cycles Dose modification: 50 mg/m2 (original dose 75 mg/m2, Cycle 1, Reason: Provider Judgment), 50 mg/m2 (original dose 75 mg/m2, Cycle 2, Reason: Provider Judgment) Administration: 100 mg (08/22/2018), 100 mg (08/23/2018), 100 mg (08/26/2018), 100 mg (08/27/2018), 100 mg (08/28/2018), 100 mg (08/29/2018), 100 mg (08/30/2018), 100 mg (10/01/2018), 100 mg (10/02/2018), 100 mg (11/04/2018), 100 mg (11/05/2018), 100 mg (09/23/2018), 100 mg (09/24/2018), 100 mg (09/25/2018), 100 mg (09/26/2018), 100 mg (09/27/2018), 100 mg (10/28/2018), 100 mg (10/29/2018), 100 mg (10/30/2018), 100 mg (10/31/2018), 100 mg (11/01/2018), 100 mg (12/02/2018), 100 mg (12/03/2018), 100 mg (12/04/2018), 100 mg (12/05/2018), 100 mg (12/06/2018)  for chemotherapy treatment.    01/20/2019 -  Chemotherapy   The patient had decitabine (DACOGEN) 45 mg in sodium chloride 0.9 %  50 mL chemo infusion, 20 mg/m2 = 45 mg, Intravenous,  Once, 6 of 6 cycles Dose modification: 15 mg/m2 (original dose 20 mg/m2, Cycle 2, Reason: Other (see comments), Comment: neutropenic fever) Administration: 45 mg (01/20/2019), 45 mg (01/21/2019), 45 mg (01/22/2019), 45 mg (01/23/2019), 45 mg (01/24/2019), 35 mg (03/17/2019), 35 mg (03/18/2019), 35 mg (03/19/2019), 35 mg (03/20/2019), 35 mg (03/21/2019), 35 mg (04/22/2019), 35 mg (04/23/2019), 35 mg (04/24/2019), 35 mg (04/25/2019), 35 mg (05/26/2019), 35 mg (05/27/2019), 35 mg (05/28/2019), 35 mg (05/29/2019), 35 mg (05/30/2019), 35 mg (07/07/2019), 35 mg (07/08/2019), 35 mg (07/09/2019), 35 mg (07/10/2019), 35 mg (07/11/2019), 35 mg (08/18/2019) ondansetron (ZOFRAN) 8 mg in sodium chloride 0.9 % 50 mL IVPB, 8 mg (100 % of original dose 8 mg), Intravenous,  Once, 6 of 6 cycles Dose modification: 8 mg (original dose 8 mg, Cycle 1) Administration: 8 mg (01/20/2019), 8 mg (01/21/2019), 8 mg (01/22/2019), 8 mg (01/23/2019), 8 mg (01/24/2019), 8 mg (03/17/2019), 8 mg (03/18/2019), 8 mg (03/19/2019), 8 mg (03/20/2019), 8 mg (03/21/2019), 8 mg (04/22/2019), 8 mg (04/23/2019), 8 mg (04/24/2019), 8 mg (04/25/2019), 8 mg (05/26/2019), 8 mg (05/27/2019), 8 mg (05/28/2019), 8 mg (05/29/2019), 8 mg (05/30/2019), 8 mg (07/07/2019), 8 mg (07/08/2019), 8 mg (07/09/2019), 8 mg (07/10/2019), 8 mg (07/11/2019), 8 mg (08/18/2019)  for chemotherapy treatment.    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[place])   01/20/2019 -  Chemotherapy   The patient had decitabine (DACOGEN) 45 mg in sodium chloride 0.9 % 50 mL chemo infusion, 20 mg/m2 = 45 mg, Intravenous,  Once, 6 of 6 cycles Dose modification: 15 mg/m2 (original dose 20 mg/m2, Cycle 2, Reason: Other (  see comments), Comment: neutropenic fever) Administration: 45 mg (01/20/2019), 45 mg (01/21/2019), 45 mg (01/22/2019), 45 mg (01/23/2019), 45 mg (01/24/2019), 35 mg (03/17/2019), 35 mg (03/18/2019), 35 mg  (03/19/2019), 35 mg (03/20/2019), 35 mg (03/21/2019), 35 mg (04/22/2019), 35 mg (04/23/2019), 35 mg (04/24/2019), 35 mg (04/25/2019), 35 mg (05/26/2019), 35 mg (05/27/2019), 35 mg (05/28/2019), 35 mg (05/29/2019), 35 mg (05/30/2019), 35 mg (07/07/2019), 35 mg (07/08/2019), 35 mg (07/09/2019), 35 mg (07/10/2019), 35 mg (07/11/2019), 35 mg (08/18/2019) ondansetron (ZOFRAN) 8 mg in sodium chloride 0.9 % 50 mL IVPB, 8 mg (100 % of original dose 8 mg), Intravenous,  Once, 6 of 6 cycles Dose modification: 8 mg (original dose 8 mg, Cycle 1) Administration: 8 mg (01/20/2019), 8 mg (01/21/2019), 8 mg (01/22/2019), 8 mg (01/23/2019), 8 mg (01/24/2019), 8 mg (03/17/2019), 8 mg (03/18/2019), 8 mg (03/19/2019), 8 mg (03/20/2019), 8 mg (03/21/2019), 8 mg (04/22/2019), 8 mg (04/23/2019), 8 mg (04/24/2019), 8 mg (04/25/2019), 8 mg (05/26/2019), 8 mg (05/27/2019), 8 mg (05/28/2019), 8 mg (05/29/2019), 8 mg (05/30/2019), 8 mg (07/07/2019), 8 mg (07/08/2019), 8 mg (07/09/2019), 8 mg (07/10/2019), 8 mg (07/11/2019), 8 mg (08/18/2019)  for chemotherapy treatment.       CANCER STAGING: Cancer Staging No matching staging information was found for the patient.   INTERVAL HISTORY:  Richard Wallace 74 y.o. male seen for follow-up bone density scan prior to next cycle of chemotherapy for AML.  Appetite is reported as 100%.  Energy levels are 25%.  He is accompanied by his wife today.  Denies any fevers or chills.  Joint pains are well controlled with tramadol and Tylenol.  Denies nausea vomiting diarrhea or constipation.  REVIEW OF SYSTEMS:  Review of Systems  Musculoskeletal: Positive for arthralgias.  All other systems reviewed and are negative.    PAST MEDICAL/SURGICAL HISTORY:  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   Cancer ([place]) 01-28-2014   skin cancer   CKD (chronic kidney disease), stage III    GERD (gastroesophageal reflux disease)    Heart murmur    NOTED DURING PHYSICAL  WHEN HE WAS ENLISTING IN [facility] , DIDNT KNOW UNTIL THAT TIME AND REPORTS , "THATS THE LAST I HEARD ABOUT IT "    History of hypertension    no longer issue   History of kidney stones    History of malignant melanoma of skin    excision top of scalp 2015-- no recurrence   History of urinary retention    post op lumbar fusion surgery 04/ 2016   Hypertension    Kidney dysfunction    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, MONITORED BY [facility] DR Annie Main DAHLSTEDT    Left ureteral calculus    Seasonal allergies    Wears glasses    Wears glasses    Wears partial dentures    upper and lower   Past Surgical History:  Procedure Laterality Date   ANKLE FUSION Right 2007   CARPAL TUNNEL RELEASE Left 12/28/2009   w/ pulley release left long finger   CARPAL TUNNEL RELEASE Right 07/22/2013   Procedure: RIGHT CARPAL TUNNEL RELEASE;  Surgeon: Cammie Sickle., MD;  Location: [facility];  Service: Orthopedics;  Laterality: Right;   COLONOSCOPY     CYSTO/  LEFT RETROGRADE PYELOGRAM  11/21/2010   CYSTOSCOPY WITH STENT PLACEMENT Left 03/09/2016   Procedure: CYSTOSCOPY WITH STENT PLACEMENT;  Surgeon: Franchot Gallo, MD;  Location: [facility];  Service: Urology;  Laterality: Left;   CYSTOSCOPY/RETROGRADE/URETEROSCOPY/STONE EXTRACTION WITH BASKET Left 03/09/2016   Procedure: CYSTOSCOPY/RETROGRADE/URETEROSCOPY/STONE EXTRACTION WITH BASKET;  Surgeon: Franchot Gallo, MD;  Location[facility];  Service: Urology;  Laterality: Left;   LEFT URETEROSCOPIC LASER LITHOTRIPSY STONE EXTRACTION/ STENT PLACEMENT  05/23/2010   MOHS SURGERY     TOP OF THE HEAD    ORIF ANKLE FRACTURE Right 1978   PORT-A-CATH REMOVAL Right 02/14/2019   Procedure: MINOR REMOVAL PORT-A-CATH;  Surgeon: Aviva Signs, MD;  Location: [facility];  Service: General;  Laterality: Right;   PORTACATH PLACEMENT Right 08/19/2018   Procedure: INSERTION PORT-A-CATH (attached  catheter in right subclavian);  Surgeon: Aviva Signs, MD;  Location: [facility];  Service: General;  Laterality: Right;   PORTACATH PLACEMENT Left 05/16/2019   Procedure: INSERTION PORT-A-CATH (attached catheter in left subclavian);  Surgeon: Aviva Signs, MD;  Location: [facility];  Service: General;  Laterality: Left;   POSTERIOR LUMBAR FUSION  08/21/2014   laminectomy and decompression L2 -- L5   RIGHT LOWER LEG SURGERY  X3  1975 to 1976   including ORIF   TONSILLECTOMY AND ADENOIDECTOMY  4917   UMBILICAL HERNIA REPAIR  2009 approx     SOCIAL HISTORY:  Social History   Socioeconomic History   Marital status: Married    Spouse name: Not on file   Number of children: Not on file   Years of education: Not on file   Highest education level: Not on file  Occupational History   Not on file  Tobacco Use   Smoking status: Former Smoker    Years: 20.00    Types: Cigarettes    Quit date: 07/17/1986    Years since quitting: 33.1   Smokeless tobacco: Never Used  Substance and Sexual Activity   Alcohol use: Yes    Alcohol/week: 7.0 - 14.0 standard drinks    Types: 7 - 14 Cans of beer per week    Comment: 1 -2 beer daily   Drug use: No   Sexual activity: Not Currently  Other Topics Concern   Not on file  Social History Narrative   Not on file   Social Determinants of Health   Financial Resource Strain: Unknown   Difficulty of Paying Living Expenses: Patient refused  Food Insecurity: Unknown   Worried About Charity fundraiser in the Last Year: Patient refused   Arboriculturist in the Last Year: Patient refused  Transportation Needs: Unknown   Film/video editor (Medical): Patient refused   Lack of Transportation (Non-Medical): Patient refused  Physical Activity: Unknown   Days of Exercise per Week: Patient refused   Minutes of Exercise per Session: Patient refused  Stress: Unknown   Feeling of Stress : Patient refused  Social Connections: Unknown    Frequency of Communication with Friends and Family: Patient refused   Frequency of Social Gatherings with Friends and Family: Patient refused   Attends Religious Services: Patient refused   Marine scientist or Organizations: Patient refused   Attends Music therapist: Patient refused   Marital Status: Patient refused  Intimate Production manager Violence: Unknown   Fear of Current or Ex-Partner: Patient refused   Emotionally Abused: Patient refused   Physically Abused: Patient refused   Sexually Abused: Patient refused    FAMILY HISTORY:  Family History  Problem Relation Age of Onset   Stroke Mother    Prostate cancer Father    Bone cancer  Father    Diverticulitis Father    Rheum arthritis Sister    Urinary tract infection Sister    Colon cancer Neg Hx     CURRENT MEDICATIONS:  Outpatient Encounter Medications as of 08/18/2019  Medication Sig   acetaminophen (TYLENOL) 500 MG tablet Take 1,000 mg by mouth 3 (three) times daily.    allopurinol (ZYLOPRIM) 300 MG tablet Take 300 mg by mouth at bedtime.    amLODipine (NORVASC) 2.5 MG tablet Take 1 tablet (2.5 mg total) by mouth daily.   Apoaequorin (PREVAGEN) 10 MG CAPS Take 1 capsule by mouth daily.   cetirizine (ZYRTEC) 10 MG tablet Take 10 mg by mouth daily. IN THE MORNING   Cholecalciferol (VITAMIN D3) 1.25 MG (50000 UT) CAPS Take 1 capsule by mouth daily.   docusate sodium (COLACE) 100 MG capsule Take 100 mg by mouth at bedtime.    lansoprazole (PREVACID) 15 MG capsule Take 15 mg by mouth daily.    magnesium oxide (MAG-OX) 400 MG tablet Take 1 tablet (400 mg total) by mouth 3 (three) times daily.   tamsulosin (FLOMAX) 0.4 MG CAPS capsule Take 0.4 mg by mouth every evening.    venetoclax 100 MG TABS Take 200 mg by mouth daily.   zinc gluconate 50 MG tablet Take 50 mg by mouth daily.   diclofenac sodium (VOLTAREN) 1 % GEL Apply 1 g topically 3 (three) times daily as needed (knee pain.).     lidocaine-prilocaine (EMLA) cream Apply 1 application topically See admin instructions. ONE HOUR PRIOR TO CHEMOTHERAPY APPOINTMENT   traMADol (ULTRAM) 50 MG tablet TAKE 1 TABLET BY MOUTH THREE TIMES DAILY AS NEEDED FOR PAIN (Patient not taking: Reported on 08/18/2019)   [DISCONTINUED] traMADol (ULTRAM) 50 MG tablet TAKE 1 TABLET BY MOUTH THREE TIMES DAILY AS NEEDED FOR PAIN   Facility-Administered Encounter Medications as of 08/18/2019  Medication   sodium chloride flush (NS) 0.9 % injection 20 mL    ALLERGIES:  No Known Allergies   PHYSICAL EXAM:  ECOG Performance status: 1  There were no vitals filed for this visit. There were no vitals filed for this visit.  Physical Exam Constitutional:      Appearance: Normal appearance.  Eyes:     Extraocular Movements: Extraocular movements intact.     Conjunctiva/sclera: Conjunctivae normal.  Cardiovascular:     Rate and Rhythm: Normal rate and regular rhythm.     Heart sounds: Normal heart sounds.  Pulmonary:     Effort: Pulmonary effort is normal.     Breath sounds: Normal breath sounds.  Abdominal:     General: There is no distension.     Palpations: Abdomen is soft. There is no mass.  Musculoskeletal:        General: No deformity. Normal range of motion.     Cervical back: Normal range of motion.     Right lower leg: Edema present.     Left lower leg: Edema present.  Skin:    General: Skin is warm.  Neurological:     General: No focal deficit present.     Mental Status: He is alert and oriented to person, place, and time.  Psychiatric:        Mood and Affect: Mood normal.        Behavior: Behavior normal.      LABORATORY DATA:  I have reviewed the labs as listed.  CBC    Component Value Date/Time   WBC 4.2 08/18/2019 0916   RBC 4.20 (L)  08/18/2019 0916   HGB 14.1 08/18/2019 0916   HCT 44.0 08/18/2019 0916   PLT 154 08/18/2019 0916   MCV 104.8 (H) 08/18/2019 0916   MCH 33.6 08/18/2019 0916   MCHC 32.0  08/18/2019 0916   RDW 16.4 (H) 08/18/2019 0916   LYMPHSABS 1.2 08/18/2019 0916   MONOABS 0.6 08/18/2019 0916   EOSABS 0.0 08/18/2019 0916   BASOSABS 0.1 08/18/2019 0916   CMP Latest Ref Rng & Units 08/18/2019 08/06/2019 07/21/2019  Glucose 70 - 99 mg/dL 87 114(H) 112(H)  BUN 8 - 23 mg/dL 32(H) 22 24(H)  Creatinine 0.61 - 1.24 mg/dL 1.16 1.08 0.96  Sodium 135 - 145 mmol/L 141 141 140  Potassium 3.5 - 5.1 mmol/L 4.1 4.6 4.0  Chloride 98 - 111 mmol/L 107 104 107  CO2 22 - 32 mmol/L '25 28 24  ' Calcium 8.9 - 10.3 mg/dL 9.5 9.7 9.5  Total Protein 6.5 - 8.1 g/dL 6.4(L) 6.4(L) 6.4(L)  Total Bilirubin 0.3 - 1.2 mg/dL 0.8 0.7 0.7  Alkaline Phos 38 - 126 U/L 59 63 61  AST 15 - 41 U/L '20 20 22  ' ALT 0 - 44 U/L 30 35 26     I have reviewed the scans.   ASSESSMENT & PLAN:   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[place]) 1.  Acute myeloid leukemia: -5 cycles of decitabine and venetoclax from 01/20/2019 through 07/07/2019. -Bone marrow biopsy on 02/25/2019 after cycle 1 did not show any evidence of leukemia. -He takes venetoclax 200 mg for 2 weeks with each cycle. -He is receiving decitabine every 6 weeks on average. -I have reviewed his CBC which shows improvement in white count normal.  Platelet count is normal.  He will proceed with his cycle 6 today. -He will start venetoclax 200 mg daily today.  I will see him back in 2 weeks for follow-up with repeat labs.  2.  CKD: -Baseline creatinine is between 1.1-1.5.  It is 1.16 today.  3.  Hypomagnesemia: -He will continue magnesium 3 times a day.  His magnesium today is 1.8.  4.  Hypertension: -Blood pressure today is 129/72.  Lisinopril are on hold.  He will continue Norvasc 2.5 mg as needed.  5.  Bilateral knee pains: -He will continue tramadol and Tylenol as needed.    Orders placed this encounter:  No orders of the defined types were placed in this encounter.     [name], MD [facility] [phone]

## 2019-08-18 NOTE — Patient Instructions (Addendum)
[place] at [facility] Discharge Instructions  You were seen today by Dr. Delton Coombes. He went over your recent lab results. Start taking the Venetoclax today. He will see you back in 2 weeks for labs and follow up.   Thank you for choosing [place] at [facility] to provide your oncology and hematology care.  To afford each patient quality time with our provider, please arrive at least 15 minutes before your scheduled appointment time.   If you have a lab appointment with the [facility] please come in thru the  Main Entrance and check in at the main information desk  You need to re-schedule your appointment should you arrive 10 or more minutes late.  We strive to give you quality time with our providers, and arriving late affects you and other patients whose appointments are after yours.  Also, if you no show three or more times for appointments you may be dismissed from the clinic at the providers discretion.     Again, thank you for choosing [facility].  Our hope is that these requests will decrease the amount of time that you wait before being seen by our physicians.       _____________________________________________________________  Should you have questions after your visit to [facility], please contact our office at [phone] between the hours of 8:00 a.m. and 4:30 p.m.  Voicemails left after 4:00 p.m. will not be returned until the following business day.  For prescription refill requests, have your pharmacy contact our office and allow 72 hours.    Cancer Center Support Programs:   > Cancer Support Group  2nd Tuesday of the month 1pm-2pm, Journey Room

## 2019-08-18 NOTE — Progress Notes (Signed)
1040 Labs reviewed with and pt seen by Dr. Delton Coombes and pt approved for Decitabine infusion today per MD                               Richard Wallace tolerated Decitabine infusion well without complaints or incident. VSS upon discharge. Port left accessed,saline locked and flushed for use tomorrow. Pt discharged self ambulatory using his walker in satisfactory condtiion

## 2019-08-18 NOTE — Progress Notes (Signed)
Patient has been assessed, vital signs and labs have been reviewed by Dr. Katragadda. ANC, Creatinine, LFTs, and Platelets are within treatment parameters per Dr. Katragadda. The patient is good to proceed with treatment at this time.  

## 2019-08-18 NOTE — Patient Instructions (Signed)
[facility] Discharge Instructions for Patients Receiving Chemotherapy   Beginning January 23rd 2017 lab work for the [facility] will be done in the  Main lab at [facility] on 1st floor. If you have a lab appointment with the [facility] please come in thru the  Main Entrance and check in at the main information desk   Today you received the following chemotherapy agents Decitabine. Follow-up as scheduled. Call clinic for any questions or concerns  To help prevent nausea and vomiting after your treatment, we encourage you to take your nausea medication   If you develop nausea and vomiting, or diarrhea that is not controlled by your medication, call the clinic.  The clinic phone number is [phone]. Office hours are Monday-Friday 8:30am-5:00pm.  BELOW ARE SYMPTOMS THAT SHOULD BE REPORTED IMMEDIATELY:  *FEVER GREATER THAN 101.0 F  *CHILLS WITH OR WITHOUT FEVER  NAUSEA AND VOMITING THAT IS NOT CONTROLLED WITH YOUR NAUSEA MEDICATION  *UNUSUAL SHORTNESS OF BREATH  *UNUSUAL BRUISING OR BLEEDING  TENDERNESS IN MOUTH AND THROAT WITH OR WITHOUT PRESENCE OF ULCERS  *URINARY PROBLEMS  *BOWEL PROBLEMS  UNUSUAL RASH Items with * indicate a potential emergency and should be followed up as soon as possible. If you have an emergency after office hours please contact your primary care physician or go to the nearest emergency department.  Please call the clinic during office hours if you have any questions or concerns.   You may also contact the Patient Navigator at [phone] should you have any questions or need assistance in obtaining follow up care.      Resources For Cancer Patients and their Caregivers ? American Cancer Society: Can assist with transportation, wigs, general needs, runs Look Good Feel Better.        7472107945 ? Cancer Care: Provides financial assistance, online support groups, medication/co-pay assistance.  1-800-813-HOPE  (706) 421-2021) ? [facility] Assists [place] cancer patients and their families through emotional , educational and financial support.  (207)436-6900 ? [place] DSS Where to apply for food stamps, Medicaid and utility assistance. (910) 175-7305 ? [facility]: Transportation to medical appointments. (316) 474-3540 ? Social Security Administration: May apply for disability if have a Stage IV cancer. 709-273-3716 [phone] ? [employer], Disability and Transit Services: Assists with nutrition, care and transit needs. ([phone]

## 2019-08-18 NOTE — Patient Instructions (Signed)
[facility] at [facility] Discharge Instructions  Labs drawn from portacath today   Thank you for choosing  [facility] at [facility] to provide your oncology and hematology care.  To afford each patient quality time with our provider, please arrive at least 15 minutes before your scheduled appointment time.   If you have a lab appointment with the Cancer Center please come in thru the Main Entrance and check in at the main information desk.  You need to re-schedule your appointment should you arrive 10 or more minutes late.  We strive to give you quality time with our providers, and arriving late affects you and other patients whose appointments are after yours.  Also, if you no show three or more times for appointments you may be dismissed from the clinic at the providers discretion.     Again, thank you for choosing [facility].  Our hope is that these requests will decrease the amount of time that you wait before being seen by our physicians.       _____________________________________________________________  Should you have questions after your visit to [facility], please contact our office at [phone] between the hours of 8:00 a.m. and 4:30 p.m.  Voicemails left after 4:00 p.m. will not be returned until the following business day.  For prescription refill requests, have your pharmacy contact our office and allow 72 hours.    Due to Covid, you will need to wear a mask upon entering the hospital. If you do not have a mask, a mask will be given to you at the Main Entrance upon arrival. For doctor visits, patients may have 1 support person with them. For treatment visits, patients can not have anyone with them due to social distancing guidelines and our immunocompromised population.     

## 2019-08-19 ENCOUNTER — Inpatient Hospital Stay (HOSPITAL_COMMUNITY): Payer: Medicare Other

## 2019-08-19 ENCOUNTER — Encounter (HOSPITAL_COMMUNITY): Payer: Self-pay

## 2019-08-19 VITALS — BP 140/78 | HR 73 | Temp 98.1°F | Resp 18

## 2019-08-19 DIAGNOSIS — D46Z Other myelodysplastic syndromes: Secondary | ICD-10-CM

## 2019-08-19 DIAGNOSIS — C9200 Acute myeloblastic leukemia, not having achieved remission: Secondary | ICD-10-CM

## 2019-08-19 DIAGNOSIS — Z95828 Presence of other vascular implants and grafts: Secondary | ICD-10-CM

## 2019-08-19 DIAGNOSIS — Z5111 Encounter for antineoplastic chemotherapy: Secondary | ICD-10-CM | POA: Diagnosis not present

## 2019-08-19 MED ORDER — HEPARIN SOD (PORK) LOCK FLUSH 100 UNIT/ML IV SOLN
500.0000 [IU] | Freq: Once | INTRAVENOUS | Status: AC | PRN
  Administered 2019-08-19: 500 [IU]

## 2019-08-19 MED ORDER — SODIUM CHLORIDE 0.9 % IV SOLN
8.0000 mg | Freq: Once | INTRAVENOUS | Status: AC
  Administered 2019-08-19: 8 mg via INTRAVENOUS
  Filled 2019-08-19: qty 4

## 2019-08-19 MED ORDER — SODIUM CHLORIDE 0.9 % IV SOLN: Freq: Once | INTRAVENOUS | Status: AC

## 2019-08-19 MED ORDER — SODIUM CHLORIDE 0.9% FLUSH
10.0000 mL | INTRAVENOUS | Status: DC | PRN
  Administered 2019-08-19: 10 mL

## 2019-08-19 MED ORDER — SODIUM CHLORIDE 0.9 % IV SOLN
15.0000 mg/m2 | Freq: Once | INTRAVENOUS | Status: AC
  Administered 2019-08-19: 35 mg via INTRAVENOUS
  Filled 2019-08-19: qty 7

## 2019-08-19 NOTE — Patient Instructions (Signed)
[facility] Discharge Instructions for Patients Receiving Chemotherapy   Beginning January 23rd 2017 lab work for the [facility] will be done in the  Main lab at [facility] on 1st floor. If you have a lab appointment with the [facility] please come in thru the  Main Entrance and check in at the main information desk   Today you received the following chemotherapy agents Decitabine. Follow-up as scheduled. Call clinic for any questions or concerns  To help prevent nausea and vomiting after your treatment, we encourage you to take your nausea medication   If you develop nausea and vomiting, or diarrhea that is not controlled by your medication, call the clinic.  The clinic phone number is [phone]. Office hours are Monday-Friday 8:30am-5:00pm.  BELOW ARE SYMPTOMS THAT SHOULD BE REPORTED IMMEDIATELY:  *FEVER GREATER THAN 101.0 F  *CHILLS WITH OR WITHOUT FEVER  NAUSEA AND VOMITING THAT IS NOT CONTROLLED WITH YOUR NAUSEA MEDICATION  *UNUSUAL SHORTNESS OF BREATH  *UNUSUAL BRUISING OR BLEEDING  TENDERNESS IN MOUTH AND THROAT WITH OR WITHOUT PRESENCE OF ULCERS  *URINARY PROBLEMS  *BOWEL PROBLEMS  UNUSUAL RASH Items with * indicate a potential emergency and should be followed up as soon as possible. If you have an emergency after office hours please contact your primary care physician or go to the nearest emergency department.  Please call the clinic during office hours if you have any questions or concerns.   You may also contact the Patient Navigator at [phone] should you have any questions or need assistance in obtaining follow up care.      Resources For Cancer Patients and their Caregivers ? American Cancer Society: Can assist with transportation, wigs, general needs, runs Look Good Feel Better.        ([phone] ? Cancer Care: Provides financial assistance, online support groups, medication/co-pay assistance.  1-800-813-HOPE  918-095-5652) ? [place] Assists [place] cancer patients and their families through emotional , educational and financial support.  [phone] ? [place] DSS Where to apply for food stamps, Medicaid and utility assistance. ([phone] 252-6180 ? [employer]: Transportation to medical appointments. 661-640-1897 ? Social Security Administration: May apply for disability if have a Stage IV cancer. [phone] ? [employer], Disability and [employer] Services: Assists with nutrition, care and transit needs. [phone]

## 2019-08-19 NOTE — Progress Notes (Signed)
Richard Wallace tolerated Decitabine infusion well without complaints or incident. Port left accessed and flushed for use tomorrow. VSS upon discharge. Pt discharged self ambulatory using his walker in satisfactory condition

## 2019-08-20 ENCOUNTER — Inpatient Hospital Stay (HOSPITAL_COMMUNITY): Payer: Medicare Other

## 2019-08-20 ENCOUNTER — Encounter (HOSPITAL_COMMUNITY): Payer: Self-pay

## 2019-08-20 ENCOUNTER — Other Ambulatory Visit: Payer: Self-pay

## 2019-08-20 VITALS — BP 136/79 | HR 73 | Temp 97.3°F | Resp 18

## 2019-08-20 DIAGNOSIS — Z95828 Presence of other vascular implants and grafts: Secondary | ICD-10-CM

## 2019-08-20 DIAGNOSIS — D46Z Other myelodysplastic syndromes: Secondary | ICD-10-CM

## 2019-08-20 DIAGNOSIS — C9200 Acute myeloblastic leukemia, not having achieved remission: Secondary | ICD-10-CM

## 2019-08-20 DIAGNOSIS — Z5111 Encounter for antineoplastic chemotherapy: Secondary | ICD-10-CM | POA: Diagnosis not present

## 2019-08-20 MED ORDER — SODIUM CHLORIDE 0.9% FLUSH
10.0000 mL | INTRAVENOUS | Status: DC | PRN
  Administered 2019-08-20: 10 mL

## 2019-08-20 MED ORDER — SODIUM CHLORIDE 0.9 % IV SOLN
15.0000 mg/m2 | Freq: Once | INTRAVENOUS | Status: AC
  Administered 2019-08-20: 35 mg via INTRAVENOUS
  Filled 2019-08-20: qty 7

## 2019-08-20 MED ORDER — SODIUM CHLORIDE 0.9 % IV SOLN
8.0000 mg | Freq: Once | INTRAVENOUS | Status: AC
  Administered 2019-08-20: 8 mg via INTRAVENOUS
  Filled 2019-08-20: qty 4

## 2019-08-20 MED ORDER — SODIUM CHLORIDE 0.9 % IV SOLN: Freq: Once | INTRAVENOUS | Status: AC

## 2019-08-20 MED ORDER — HEPARIN SOD (PORK) LOCK FLUSH 100 UNIT/ML IV SOLN
500.0000 [IU] | Freq: Once | INTRAVENOUS | Status: AC | PRN
  Administered 2019-08-20: 500 [IU]

## 2019-08-20 NOTE — Patient Instructions (Signed)
[facility] Discharge Instructions for Patients Receiving Chemotherapy   Beginning January 23rd 2017 lab work for the [facility] will be done in the  Main lab at [facility] on 1st floor. If you have a lab appointment with the [place] please come in thru the  Main Entrance and check in at the main information desk   Today you received the following chemotherapy agents Decitabine. Follow-up as scheduled. Call clinic for any questions or concerns  To help prevent nausea and vomiting after your treatment, we encourage you to take your nausea medication   If you develop nausea and vomiting, or diarrhea that is not controlled by your medication, call the clinic.  The clinic phone number is [phone]. Office hours are Monday-Friday 8:30am-5:00pm.  BELOW ARE SYMPTOMS THAT SHOULD BE REPORTED IMMEDIATELY:  *FEVER GREATER THAN 101.0 F  *CHILLS WITH OR WITHOUT FEVER  NAUSEA AND VOMITING THAT IS NOT CONTROLLED WITH YOUR NAUSEA MEDICATION  *UNUSUAL SHORTNESS OF BREATH  *UNUSUAL BRUISING OR BLEEDING  TENDERNESS IN MOUTH AND THROAT WITH OR WITHOUT PRESENCE OF ULCERS  *URINARY PROBLEMS  *BOWEL PROBLEMS  UNUSUAL RASH Items with * indicate a potential emergency and should be followed up as soon as possible. If you have an emergency after office hours please contact your primary care physician or go to the nearest emergency department.  Please call the clinic during office hours if you have any questions or concerns.   You may also contact the Patient Navigator at [phone] should you have any questions or need assistance in obtaining follow up care.      Resources For Cancer Patients and their Caregivers ? American Cancer Society: Can assist with transportation, wigs, general needs, runs Look Good Feel Better.        4406511856 ? Cancer Care: Provides financial assistance, online support groups, medication/co-pay assistance.  1-800-813-HOPE  (570)778-2723) ? [place] Assists [place] cancer patients and their families through emotional , educational and financial support.  ([phone] ? [place] DSS Where to apply for food stamps, Medicaid and utility assistance. 805-325-8830 ? [employer]: Transportation to medical appointments. ([phone] ? Social Security Administration: May apply for disability if have a Stage IV cancer. [phone] 440-220-9237 ? [employer], Disability and Transit Services: Assists with nutrition, care and transit needs. [phone]

## 2019-08-20 NOTE — Progress Notes (Signed)
[name] tolerated Decitabine infusion well without complaints or incident. Port left accessed and flushed for use tomorrow. VSS upon discharge. Pt discharged self ambulatory using his walker in satisfactory condition

## 2019-08-21 ENCOUNTER — Inpatient Hospital Stay (HOSPITAL_COMMUNITY): Payer: Medicare Other

## 2019-08-21 ENCOUNTER — Other Ambulatory Visit (HOSPITAL_COMMUNITY): Payer: Self-pay | Admitting: Hematology

## 2019-08-21 VITALS — BP 150/83 | HR 67 | Temp 97.7°F | Resp 18

## 2019-08-21 DIAGNOSIS — Z5111 Encounter for antineoplastic chemotherapy: Secondary | ICD-10-CM | POA: Diagnosis not present

## 2019-08-21 DIAGNOSIS — Z95828 Presence of other vascular implants and grafts: Secondary | ICD-10-CM

## 2019-08-21 DIAGNOSIS — D46Z Other myelodysplastic syndromes: Secondary | ICD-10-CM

## 2019-08-21 DIAGNOSIS — C9200 Acute myeloblastic leukemia, not having achieved remission: Secondary | ICD-10-CM

## 2019-08-21 MED ORDER — SODIUM CHLORIDE 0.9% FLUSH
10.0000 mL | INTRAVENOUS | Status: DC | PRN
  Administered 2019-08-21: 10 mL

## 2019-08-21 MED ORDER — SODIUM CHLORIDE 0.9 % IV SOLN: Freq: Once | INTRAVENOUS | Status: AC

## 2019-08-21 MED ORDER — SODIUM CHLORIDE 0.9 % IV SOLN
15.0000 mg/m2 | Freq: Once | INTRAVENOUS | Status: AC
  Administered 2019-08-21: 35 mg via INTRAVENOUS
  Filled 2019-08-21: qty 7

## 2019-08-21 MED ORDER — SODIUM CHLORIDE 0.9 % IV SOLN
8.0000 mg | Freq: Once | INTRAVENOUS | Status: AC
  Administered 2019-08-21: 8 mg via INTRAVENOUS
  Filled 2019-08-21: qty 4

## 2019-08-21 MED ORDER — HEPARIN SOD (PORK) LOCK FLUSH 100 UNIT/ML IV SOLN
500.0000 [IU] | Freq: Once | INTRAVENOUS | Status: AC | PRN
  Administered 2019-08-21: 500 [IU]

## 2019-08-21 NOTE — Progress Notes (Signed)
Treatment given per orders. Patient tolerated it well without problems. Vitals stable and discharged home from clinic ambulatory. Follow up as scheduled.  

## 2019-08-22 ENCOUNTER — Other Ambulatory Visit: Payer: Self-pay

## 2019-08-22 ENCOUNTER — Inpatient Hospital Stay (HOSPITAL_COMMUNITY): Payer: Medicare Other

## 2019-08-22 ENCOUNTER — Encounter (HOSPITAL_COMMUNITY): Payer: Self-pay

## 2019-08-22 VITALS — BP 151/76 | HR 76 | Temp 98.6°F | Resp 18

## 2019-08-22 DIAGNOSIS — Z95828 Presence of other vascular implants and grafts: Secondary | ICD-10-CM

## 2019-08-22 DIAGNOSIS — D46Z Other myelodysplastic syndromes: Secondary | ICD-10-CM

## 2019-08-22 DIAGNOSIS — Z5111 Encounter for antineoplastic chemotherapy: Secondary | ICD-10-CM | POA: Diagnosis not present

## 2019-08-22 DIAGNOSIS — C9200 Acute myeloblastic leukemia, not having achieved remission: Secondary | ICD-10-CM

## 2019-08-22 MED ORDER — HEPARIN SOD (PORK) LOCK FLUSH 100 UNIT/ML IV SOLN
500.0000 [IU] | Freq: Once | INTRAVENOUS | Status: AC | PRN
  Administered 2019-08-22: 500 [IU]

## 2019-08-22 MED ORDER — SODIUM CHLORIDE 0.9 % IV SOLN
8.0000 mg | Freq: Once | INTRAVENOUS | Status: AC
  Administered 2019-08-22: 8 mg via INTRAVENOUS
  Filled 2019-08-22: qty 4

## 2019-08-22 MED ORDER — SODIUM CHLORIDE 0.9 % IV SOLN
15.0000 mg/m2 | Freq: Once | INTRAVENOUS | Status: AC
  Administered 2019-08-22: 35 mg via INTRAVENOUS
  Filled 2019-08-22: qty 7

## 2019-08-22 MED ORDER — SODIUM CHLORIDE 0.9% FLUSH
10.0000 mL | INTRAVENOUS | Status: DC | PRN
  Administered 2019-08-22: 10 mL

## 2019-08-22 MED ORDER — SODIUM CHLORIDE 0.9 % IV SOLN: Freq: Once | INTRAVENOUS | Status: AC

## 2019-08-22 NOTE — Patient Instructions (Signed)
[facility] Discharge Instructions for Patients Receiving Chemotherapy   Beginning January 23rd 2017 lab work for the [facility] will be done in the  Main lab at [facility] on 1st floor. If you have a lab appointment with the [place] please come in thru the  Main Entrance and check in at the main information desk   Today you received the following chemotherapy agents Decitabine. Follow-up as scheduled. Call clinic for any questions or concerns  To help prevent nausea and vomiting after your treatment, we encourage you to take your nausea medication   If you develop nausea and vomiting, or diarrhea that is not controlled by your medication, call the clinic.  The clinic phone number is [phone]. Office hours are Monday-Friday 8:30am-5:00pm.  BELOW ARE SYMPTOMS THAT SHOULD BE REPORTED IMMEDIATELY:  *FEVER GREATER THAN 101.0 F  *CHILLS WITH OR WITHOUT FEVER  NAUSEA AND VOMITING THAT IS NOT CONTROLLED WITH YOUR NAUSEA MEDICATION  *UNUSUAL SHORTNESS OF BREATH  *UNUSUAL BRUISING OR BLEEDING  TENDERNESS IN MOUTH AND THROAT WITH OR WITHOUT PRESENCE OF ULCERS  *URINARY PROBLEMS  *BOWEL PROBLEMS  UNUSUAL RASH Items with * indicate a potential emergency and should be followed up as soon as possible. If you have an emergency after office hours please contact your primary care physician or go to the nearest emergency department.  Please call the clinic during office hours if you have any questions or concerns.   You may also contact the Patient Navigator at [phone] should you have any questions or need assistance in obtaining follow up care.      Resources For Cancer Patients and their Caregivers ? American Cancer Society: Can assist with transportation, wigs, general needs, runs Look Good Feel Better.        [phone] ? Cancer Care: Provides financial assistance, online support groups, medication/co-pay assistance.  1-800-813-HOPE  479-552-4866) ? [place] Assists [place] cancer patients and their families through emotional , educational and financial support.  628-846-8603 ? [place] DSS Where to apply for food stamps, Medicaid and utility assistance. 708-396-5014 ? [employer]: Transportation to medical appointments. 606-198-1720 ? Social Security Administration: May apply for disability if have a Stage IV cancer. [phone] [phone] ? [employer], Disability and Transit Services: Assists with nutrition, care and transit needs. [phone]

## 2019-08-22 NOTE — Progress Notes (Signed)
Richard Wallace tolerated Decitabine infusion well without complaints or incident. VSS upon discharge. Pt discharged self ambulatory using his walker in satisfactory condition

## 2019-09-03 ENCOUNTER — Inpatient Hospital Stay (HOSPITAL_COMMUNITY): Payer: Medicare Other

## 2019-09-03 ENCOUNTER — Inpatient Hospital Stay (HOSPITAL_BASED_OUTPATIENT_CLINIC_OR_DEPARTMENT_OTHER): Payer: Medicare Other | Admitting: Hematology

## 2019-09-03 ENCOUNTER — Encounter (HOSPITAL_COMMUNITY): Payer: Self-pay | Admitting: Hematology

## 2019-09-03 ENCOUNTER — Other Ambulatory Visit: Payer: Self-pay

## 2019-09-03 DIAGNOSIS — C9200 Acute myeloblastic leukemia, not having achieved remission: Secondary | ICD-10-CM | POA: Diagnosis not present

## 2019-09-03 DIAGNOSIS — Z5111 Encounter for antineoplastic chemotherapy: Secondary | ICD-10-CM | POA: Diagnosis not present

## 2019-09-03 DIAGNOSIS — M25559 Pain in unspecified hip: Secondary | ICD-10-CM

## 2019-09-03 LAB — CBC WITH DIFFERENTIAL/PLATELET
Abs Immature Granulocytes: 0.01 10*3/uL (ref 0.00–0.07)
Basophils Absolute: 0 10*3/uL (ref 0.0–0.1)
Basophils Relative: 0 %
Eosinophils Absolute: 0 10*3/uL (ref 0.0–0.5)
Eosinophils Relative: 1 %
HCT: 43.2 % (ref 39.0–52.0)
Hemoglobin: 14 g/dL (ref 13.0–17.0)
Immature Granulocytes: 1 %
Lymphocytes Relative: 45 %
Lymphs Abs: 0.8 10*3/uL (ref 0.7–4.0)
MCH: 33.8 pg (ref 26.0–34.0)
MCHC: 32.4 g/dL (ref 30.0–36.0)
MCV: 104.3 fL — ABNORMAL HIGH (ref 80.0–100.0)
Monocytes Absolute: 0.2 10*3/uL (ref 0.1–1.0)
Monocytes Relative: 12 %
Neutro Abs: 0.7 10*3/uL — ABNORMAL LOW (ref 1.7–7.7)
Neutrophils Relative %: 41 %
Platelets: 87 10*3/uL — ABNORMAL LOW (ref 150–400)
RBC: 4.14 MIL/uL — ABNORMAL LOW (ref 4.22–5.81)
RDW: 15.7 % — ABNORMAL HIGH (ref 11.5–15.5)
WBC: 1.7 10*3/uL — ABNORMAL LOW (ref 4.0–10.5)
nRBC: 0 % (ref 0.0–0.2)

## 2019-09-03 LAB — COMPREHENSIVE METABOLIC PANEL
ALT: 39 U/L (ref 0–44)
AST: 25 U/L (ref 15–41)
Albumin: 4 g/dL (ref 3.5–5.0)
Alkaline Phosphatase: 57 U/L (ref 38–126)
Anion gap: 10 (ref 5–15)
BUN: 30 mg/dL — ABNORMAL HIGH (ref 8–23)
CO2: 25 mmol/L (ref 22–32)
Calcium: 10.1 mg/dL (ref 8.9–10.3)
Chloride: 108 mmol/L (ref 98–111)
Creatinine, Ser: 1.23 mg/dL (ref 0.61–1.24)
GFR calc Af Amer: >60 mL/min (ref >60)
GFR calc non Af Amer: 57 mL/min — ABNORMAL LOW (ref >60)
Glucose, Bld: 98 mg/dL (ref 70–99)
Potassium: 4.9 mmol/L (ref 3.5–5.1)
Sodium: 143 mmol/L (ref 135–145)
Total Bilirubin: 0.8 mg/dL (ref 0.3–1.2)
Total Protein: 6.7 g/dL (ref 6.5–8.1)

## 2019-09-03 LAB — LACTATE DEHYDROGENASE: LDH: 159 U/L (ref 98–192)

## 2019-09-03 LAB — MAGNESIUM: Magnesium: 2 mg/dL (ref 1.7–2.4)

## 2019-09-03 NOTE — Progress Notes (Signed)
[facility] [address] [place] [address]   CLINIC:  Medical Oncology/Hematology  PCP:  [name], MD [place] [address] [phone]   REASON FOR VISIT:  Follow-up for newly diagnosed AML.  CURRENT THERAPY: Decitabine and venetoclax  BRIEF ONCOLOGIC HISTORY:  Oncology History  MDS (myelodysplastic syndrome), high grade (HCC)  08/14/2018 Initial Diagnosis   MDS (myelodysplastic syndrome), high grade ([place])   08/22/2018 - 12/31/2018 Chemotherapy   The patient had palonosetron (ALOXI) injection 0.25 mg, 0.25 mg, Intravenous,  Once, 4 of 6 cycles Administration: 0.25 mg (08/22/2018), 0.25 mg (08/26/2018), 0.25 mg (08/28/2018), 0.25 mg (08/30/2018), 0.25 mg (10/01/2018), 0.25 mg (10/02/2018), 0.25 mg (11/04/2018), 0.25 mg (09/23/2018), 0.25 mg (09/25/2018), 0.25 mg (09/27/2018), 0.25 mg (10/28/2018), 0.25 mg (10/30/2018), 0.25 mg (11/01/2018), 0.25 mg (12/02/2018), 0.25 mg (12/04/2018), 0.25 mg (12/06/2018) azaCITIDine (VIDAZA) 100 mg in sodium chloride 0.9 % 50 mL chemo infusion, 110 mg (66.7 % of original dose 75 mg/m2), Intravenous, Once, 4 of 6 cycles Dose modification: 50 mg/m2 (original dose 75 mg/m2, Cycle 1, Reason: Provider Judgment), 50 mg/m2 (original dose 75 mg/m2, Cycle 2, Reason: Provider Judgment) Administration: 100 mg (08/22/2018), 100 mg (08/23/2018), 100 mg (08/26/2018), 100 mg (08/27/2018), 100 mg (08/28/2018), 100 mg (08/29/2018), 100 mg (08/30/2018), 100 mg (10/01/2018), 100 mg (10/02/2018), 100 mg (11/04/2018), 100 mg (11/05/2018), 100 mg (09/23/2018), 100 mg (09/24/2018), 100 mg (09/25/2018), 100 mg (09/26/2018), 100 mg (09/27/2018), 100 mg (10/28/2018), 100 mg (10/29/2018), 100 mg (10/30/2018), 100 mg (10/31/2018), 100 mg (11/01/2018), 100 mg (12/02/2018), 100 mg (12/03/2018), 100 mg (12/04/2018), 100 mg (12/05/2018), 100 mg (12/06/2018)  for chemotherapy treatment.    01/20/2019 -  Chemotherapy   The patient had decitabine (DACOGEN) 45 mg in sodium chloride 0.9 %  50 mL chemo infusion, 20 mg/m2 = 45 mg, Intravenous,  Once, 6 of 6 cycles Dose modification: 15 mg/m2 (original dose 20 mg/m2, Cycle 2, Reason: Other (see comments), Comment: neutropenic fever) Administration: 45 mg (01/20/2019), 45 mg (01/21/2019), 45 mg (01/22/2019), 45 mg (01/23/2019), 45 mg (01/24/2019), 35 mg (03/17/2019), 35 mg (03/18/2019), 35 mg (03/19/2019), 35 mg (03/20/2019), 35 mg (03/21/2019), 35 mg (04/22/2019), 35 mg (04/23/2019), 35 mg (04/24/2019), 35 mg (04/25/2019), 35 mg (05/26/2019), 35 mg (05/27/2019), 35 mg (05/28/2019), 35 mg (05/29/2019), 35 mg (05/30/2019), 35 mg (07/07/2019), 35 mg (07/08/2019), 35 mg (07/09/2019), 35 mg (07/10/2019), 35 mg (07/11/2019), 35 mg (08/18/2019), 35 mg (08/19/2019), 35 mg (08/20/2019), 35 mg (08/21/2019) ondansetron (ZOFRAN) 8 mg in sodium chloride 0.9 % 50 mL IVPB, 8 mg (100 % of original dose 8 mg), Intravenous,  Once, 6 of 6 cycles Dose modification: 8 mg (original dose 8 mg, Cycle 1) Administration: 8 mg (01/20/2019), 8 mg (01/21/2019), 8 mg (01/22/2019), 8 mg (01/23/2019), 8 mg (01/24/2019), 8 mg (03/17/2019), 8 mg (03/18/2019), 8 mg (03/19/2019), 8 mg (03/20/2019), 8 mg (03/21/2019), 8 mg (04/22/2019), 8 mg (04/23/2019), 8 mg (04/24/2019), 8 mg (04/25/2019), 8 mg (05/26/2019), 8 mg (05/27/2019), 8 mg (05/28/2019), 8 mg (05/29/2019), 8 mg (05/30/2019), 8 mg (07/07/2019), 8 mg (07/08/2019), 8 mg (07/09/2019), 8 mg (07/10/2019), 8 mg (07/11/2019), 8 mg (08/18/2019), 8 mg (08/19/2019), 8 mg (08/20/2019), 8 mg (08/21/2019), 8 mg (08/22/2019)  for chemotherapy treatment.    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[place])   01/20/2019 -  Chemotherapy   The patient had decitabine (DACOGEN) 45 mg in sodium chloride 0.9 % 50 mL chemo infusion, 20 mg/m2 =  45 mg, Intravenous,  Once, 6 of 6 cycles Dose modification: 15 mg/m2 (original dose 20 mg/m2, Cycle 2, Reason: Other (see comments), Comment: neutropenic fever) Administration: 45 mg  (01/20/2019), 45 mg (01/21/2019), 45 mg (01/22/2019), 45 mg (01/23/2019), 45 mg (01/24/2019), 35 mg (03/17/2019), 35 mg (03/18/2019), 35 mg (03/19/2019), 35 mg (03/20/2019), 35 mg (03/21/2019), 35 mg (04/22/2019), 35 mg (04/23/2019), 35 mg (04/24/2019), 35 mg (04/25/2019), 35 mg (05/26/2019), 35 mg (05/27/2019), 35 mg (05/28/2019), 35 mg (05/29/2019), 35 mg (05/30/2019), 35 mg (07/07/2019), 35 mg (07/08/2019), 35 mg (07/09/2019), 35 mg (07/10/2019), 35 mg (07/11/2019), 35 mg (08/18/2019), 35 mg (08/19/2019), 35 mg (08/20/2019), 35 mg (08/21/2019) ondansetron (ZOFRAN) 8 mg in sodium chloride 0.9 % 50 mL IVPB, 8 mg (100 % of original dose 8 mg), Intravenous,  Once, 6 of 6 cycles Dose modification: 8 mg (original dose 8 mg, Cycle 1) Administration: 8 mg (01/20/2019), 8 mg (01/21/2019), 8 mg (01/22/2019), 8 mg (01/23/2019), 8 mg (01/24/2019), 8 mg (03/17/2019), 8 mg (03/18/2019), 8 mg (03/19/2019), 8 mg (03/20/2019), 8 mg (03/21/2019), 8 mg (04/22/2019), 8 mg (04/23/2019), 8 mg (04/24/2019), 8 mg (04/25/2019), 8 mg (05/26/2019), 8 mg (05/27/2019), 8 mg (05/28/2019), 8 mg (05/29/2019), 8 mg (05/30/2019), 8 mg (07/07/2019), 8 mg (07/08/2019), 8 mg (07/09/2019), 8 mg (07/10/2019), 8 mg (07/11/2019), 8 mg (08/18/2019), 8 mg (08/19/2019), 8 mg (08/20/2019), 8 mg (08/21/2019), 8 mg (08/22/2019)  for chemotherapy treatment.       CANCER STAGING: Cancer Staging No matching staging information was found for the patient.   INTERVAL HISTORY:  [name] 74 y.o. male seen for follow-up of acute myeloid leukemia and bilateral knee pains.  He reports some fatigue.  Appetite is 100%.  Energy levels are 50%.  Pains in both knees reported.  He is taking tramadol 3 times a day along with Tylenol which is not completely helping it.  He is also using stool softener.  Denies any fevers or chills.  No easy bruising or bleeding reported.  REVIEW OF SYSTEMS:  Review of Systems  Musculoskeletal: Positive for arthralgias.  All other systems reviewed and are  negative.    PAST MEDICAL/SURGICAL HISTORY:  Past Medical History:  Diagnosis Date  . Arthritis   . 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 Cancer ([facility]) 01-28-2014   skin cancer  . CKD (chronic kidney disease), stage III   . GERD (gastroesophageal reflux disease)   . Heart murmur    NOTED DURING PHYSICAL WHEN HE WAS ENLISTING IN [facility] , DIDNT KNOW UNTIL THAT TIME AND REPORTS , "THATS THE LAST I HEARD ABOUT IT "   . History of hypertension    no longer issue  . History of kidney stones   . History of malignant melanoma of skin    excision top of scalp 2015-- no recurrence  . History of urinary retention    post op lumbar fusion surgery 04/ 2016  . Hypertension   . 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 Left ureteral calculus   . Seasonal allergies   . Wears glasses   . Wears glasses   . Wears partial dentures    upper and lower   Past Surgical History:  Procedure Laterality Date  . ANKLE FUSION Right 2007  . CARPAL TUNNEL RELEASE Left 12/28/2009   w/ pulley release left long finger  . CARPAL TUNNEL RELEASE Right 07/22/2013   Procedure: RIGHT CARPAL TUNNEL  RELEASE;  Surgeon: Cammie Sickle., MD;  Location: [facility];  Service: Orthopedics;  Laterality: Right;  . COLONOSCOPY    . CYSTO/  LEFT RETROGRADE PYELOGRAM  11/21/2010  . CYSTOSCOPY WITH STENT PLACEMENT Left 03/09/2016   Procedure: CYSTOSCOPY WITH STENT PLACEMENT;  Surgeon: Franchot Gallo, MD;  [facility];  Service: Urology;  Laterality: Left;  . CYSTOSCOPY/RETROGRADE/URETEROSCOPY/STONE EXTRACTION WITH BASKET Left 03/09/2016   Procedure: CYSTOSCOPY/RETROGRADE/URETEROSCOPY/STONE EXTRACTION WITH BASKET;  Surgeon: Franchot Gallo, MD;  Location: [facility];  Service: Urology;  Laterality: Left;  . LEFT URETEROSCOPIC LASER LITHOTRIPSY STONE EXTRACTION/ STENT PLACEMENT   05/23/2010  . MOHS SURGERY     TOP OF THE HEAD   . ORIF ANKLE FRACTURE Right 1978  . PORT-A-CATH REMOVAL Right 02/14/2019   Procedure: MINOR REMOVAL PORT-A-CATH;  Surgeon: Aviva Signs, MD;  Location: [facility];  Service: General;  Laterality: Right;  . PORTACATH PLACEMENT Right 08/19/2018   Procedure: INSERTION PORT-A-CATH (attached catheter in right subclavian);  Surgeon: Aviva Signs, MD;  Location: [facility];  Service: General;  Laterality: Right;  . PORTACATH PLACEMENT Left 05/16/2019   Procedure: INSERTION PORT-A-CATH (attached catheter in left subclavian);  Surgeon: Aviva Signs, MD;  Location: [facility];  Service: General;  Laterality: Left;  . POSTERIOR LUMBAR FUSION  08/21/2014   laminectomy and decompression L2 -- L5  . RIGHT LOWER LEG SURGERY  X3  1975 to 1976   including ORIF  . TONSILLECTOMY AND ADENOIDECTOMY  1986  . UMBILICAL HERNIA REPAIR  2009 approx     SOCIAL HISTORY:  Social History   Socioeconomic History  . Marital status: Married    Spouse name: Not on file  . Number of children: Not on file  . Years of education: Not on file  . Highest education level: Not on file  Occupational History  . Not on file  Tobacco Use  . Smoking status: Former Smoker    Years: 20.00    Types: Cigarettes    Quit date: 07/17/1986    Years since quitting: 33.1  . Smokeless tobacco: Never Used  Substance and Sexual Activity  . Alcohol use: Yes    Alcohol/week: 7.0 - 14.0 standard drinks    Types: 7 - 14 Cans of beer per week    Comment: 1 -2 beer daily  . Drug use: No  . Sexual activity: Not Currently  Other Topics Concern  . Not on file  Social History Narrative  . Not on file   Social Determinants of Health   Financial Resource Strain: Unknown  . Difficulty of Paying Living Expenses: Patient refused  Food Insecurity: Unknown  . Worried About Charity fundraiser in the Last Year: Patient refused  . Ran Out of Food in the Last Year: Patient refused  Transportation Needs:  Unknown  . Lack of Transportation (Medical): Patient refused  . Lack of Transportation (Non-Medical): Patient refused  Physical Activity: Unknown  . Days of Exercise per Week: Patient refused  . Minutes of Exercise per Session: Patient refused  Stress: Unknown  . Feeling of Stress : Patient refused  Social Connections: Unknown  . Frequency of Communication with Friends and Family: Patient refused  . Frequency of Social Gatherings with Friends and Family: Patient refused  . Attends Religious Services: Patient refused  . Active Member of Clubs or Organizations: Patient refused  . Attends Archivist Meetings: Patient refused  . Marital Status: Patient refused  Intimate Partner Violence: Unknown  .  Fear of Current or Ex-Partner: Patient refused  . Emotionally Abused: Patient refused  . Physically Abused: Patient refused  . Sexually Abused: Patient refused    FAMILY HISTORY:  Family History  Problem Relation Age of Onset  . Stroke Mother   . Prostate cancer Father   . Bone cancer Father   . Diverticulitis Father   . Rheum arthritis Sister   . Urinary tract infection Sister   . Colon cancer Neg Hx     CURRENT MEDICATIONS:  Outpatient Encounter Medications as of 09/03/2019  Medication Sig  . traMADol (ULTRAM) 50 MG tablet Take 1 tablet (50 mg total) by mouth every 6 (six) hours as needed. for pain  . [DISCONTINUED] traMADol (ULTRAM) 50 MG tablet TAKE 1 TABLET BY MOUTH THREE TIMES DAILY AS NEEDED FOR PAIN  . acetaminophen (TYLENOL) 500 MG tablet Take 1,000 mg by mouth 3 (three) times daily.   Marland Kitchen allopurinol (ZYLOPRIM) 300 MG tablet Take 300 mg by mouth at bedtime.   Marland Kitchen amLODipine (NORVASC) 2.5 MG tablet Take 1 tablet by mouth once daily  . Apoaequorin (PREVAGEN) 10 MG CAPS Take 1 capsule by mouth daily.  . cetirizine (ZYRTEC) 10 MG tablet Take 10 mg by mouth daily. IN THE MORNING  . Cholecalciferol (VITAMIN D3) 1.25 MG (50000 UT) CAPS Take 1 capsule by mouth daily.  .  diclofenac sodium (VOLTAREN) 1 % GEL Apply 1 g topically 3 (three) times daily as needed (knee pain.).   Marland Kitchen docusate sodium (COLACE) 100 MG capsule Take 100 mg by mouth at bedtime.   . lansoprazole (PREVACID) 15 MG capsule Take 15 mg by mouth daily.   Marland Kitchen lidocaine-prilocaine (EMLA) cream Apply 1 application topically See admin instructions. ONE HOUR PRIOR TO CHEMOTHERAPY APPOINTMENT  . magnesium oxide (MAG-OX) 400 MG tablet Take 1 tablet (400 mg total) by mouth 3 (three) times daily.  . tamsulosin (FLOMAX) 0.4 MG CAPS capsule Take 0.4 mg by mouth every evening.   . venetoclax 100 MG TABS Take 200 mg by mouth daily.  Marland Kitchen zinc gluconate 50 MG tablet Take 50 mg by mouth daily.  . [DISCONTINUED] traMADol (ULTRAM) 50 MG tablet TAKE 1 TABLET BY MOUTH THREE TIMES DAILY AS NEEDED FOR PAIN   Facility-Administered Encounter Medications as of 09/03/2019  Medication  . sodium chloride flush (NS) 0.9 % injection 20 mL    ALLERGIES:  No Known Allergies   PHYSICAL EXAM:  ECOG Performance status: 1  Vitals:   09/03/19 1105  BP: 123/83  Pulse: 82  Resp: 16  Temp: (!) 97.1 F (36.2 C)  SpO2: 98%   Filed Weights   09/03/19 1105  Weight: 222 lb 3.2 oz (100.8 kg)    Physical Exam Constitutional:      Appearance: Normal appearance.  Eyes:     Extraocular Movements: Extraocular movements intact.     Conjunctiva/sclera: Conjunctivae normal.  Cardiovascular:     Rate and Rhythm: Normal rate and regular rhythm.     Heart sounds: Normal heart sounds.  Pulmonary:     Effort: Pulmonary effort is normal.     Breath sounds: Normal breath sounds.  Abdominal:     General: There is no distension.     Palpations: Abdomen is soft. There is no mass.  Musculoskeletal:        General: No deformity. Normal range of motion.     Cervical back: Normal range of motion.     Right lower leg: Edema present.     Left lower leg:  Edema present.  Skin:    General: Skin is warm.  Neurological:     General: No  focal deficit present.     Mental Status: He is alert and oriented to person, place, and time.  Psychiatric:        Mood and Affect: Mood normal.        Behavior: Behavior normal.      LABORATORY DATA:  I have reviewed the labs as listed.  CBC    Component Value Date/Time   WBC 1.7 (L) 09/03/2019 1040   RBC 4.14 (L) 09/03/2019 1040   HGB 14.0 09/03/2019 1040   HCT 43.2 09/03/2019 1040   PLT 87 (L) 09/03/2019 1040   MCV 104.3 (H) 09/03/2019 1040   MCH 33.8 09/03/2019 1040   MCHC 32.4 09/03/2019 1040   RDW 15.7 (H) 09/03/2019 1040   LYMPHSABS 0.8 09/03/2019 1040   MONOABS 0.2 09/03/2019 1040   EOSABS 0.0 09/03/2019 1040   BASOSABS 0.0 09/03/2019 1040   CMP Latest Ref Rng & Units 09/03/2019 08/18/2019 08/06/2019  Glucose 70 - 99 mg/dL 98 87 114(H)  BUN 8 - 23 mg/dL 30(H) 32(H) 22  Creatinine 0.61 - 1.24 mg/dL 1.23 1.16 1.08  Sodium 135 - 145 mmol/L 143 141 141  Potassium 3.5 - 5.1 mmol/L 4.9 4.1 4.6  Chloride 98 - 111 mmol/L 108 107 104  CO2 22 - 32 mmol/L _0 Calcium 8.9 - 10.3 mg/dL 10.1 9.5 9.7  Total Protein 6.5 - 8.1 g/dL 6.7 6.4(L) 6.4(L)  Total Bilirubin 0.3 - 1.2 mg/dL 0.8 0.8 0.7  Alkaline Phos 38 - 126 U/L 57 59 63  AST 15 - 41 U/L _1 ALT 0 - 44 U/L 39 30 35     I have reviewed the scans.   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 -Bone marrow biopsy on 02/25/2019 after cycle 1 did not show any evidence of leukemia. -He is currently taking venetoclax 200 mg daily.  Denies any nausea or vomiting. -I have reviewed his CBC.  White count is low at 1.7 with 41% neutrophils.  Platelets are low at 87. -LFTs are normal.  I will stop venetoclax at this time.  I will reevaluate him in 2 weeks.  2.  Arthralgias: -He has bilateral knee pains.  He is taking tramadol 3 times a day.  Pains are not well  controlled. -We will increase tramadol to every 6 hours.  3.  Hypomagnesemia: -Magnesium today is 2.  He  will continue magnesium 3 times a day.  4.  Hypertension: -Blood pressure is 123/83.  He will continue Norvasc 2.5 mg as needed.  Lisinopril is on hold.  5.  CKD: -Baseline creatinine is between 1.1 and 1.5.  Today creatinine is 1.23.  We will closely monitor.    Orders placed this encounter:  No orders of the defined types were placed in this encounter.     [name], MD [place] [phone]

## 2019-09-03 NOTE — Patient Instructions (Addendum)
[place] at [facility] Discharge Instructions  You were seen today by Dr. Delton Coombes. He went over your recent lab results. Stop taking the Venetoclax. You should have your port flushed every 2 months. He will change your Tramadol to every 6 hours. He will see you back in 2 weeks for labs and follow up.   Thank you for choosing [facility] at [facility] to provide your oncology and hematology care.  To afford each patient quality time with our provider, please arrive at least 15 minutes before your scheduled appointment time.   If you have a lab appointment with the [facility] please come in thru the  Main Entrance and check in at the main information desk  You need to re-schedule your appointment should you arrive 10 or more minutes late.  We strive to give you quality time with our providers, and arriving late affects you and other patients whose appointments are after yours.  Also, if you no show three or more times for appointments you may be dismissed from the clinic at the providers discretion.     Again, thank you for choosing [facility].  Our hope is that these requests will decrease the amount of time that you wait before being seen by our physicians.       _____________________________________________________________  Should you have questions after your visit to [facility], please contact our office at [phone] between the hours of 8:00 a.m. and 4:30 p.m.  Voicemails left after 4:00 p.m. will not be returned until the following business day.  For prescription refill requests, have your pharmacy contact our office and allow 72 hours.    Cancer Center Support Programs:   > Cancer Support Group  2nd Tuesday of the month 1pm-2pm, Journey Room

## 2019-09-06 MED ORDER — TRAMADOL HCL 50 MG PO TABS: 50.0000 mg | ORAL_TABLET | Freq: Four times a day (QID) | ORAL | 0 refills | Status: DC | PRN

## 2019-09-06 NOTE — Assessment & Plan Note (Signed)
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 -Bone marrow biopsy on 02/25/2019 after cycle 1 did not show any evidence of leukemia. -He is currently taking venetoclax 200 mg daily.  Denies any nausea or vomiting. -I have reviewed his CBC.  White count is low at 1.7 with 41% neutrophils.  Platelets are low at 87. -LFTs are normal.  I will stop venetoclax at this time.  I will reevaluate him in 2 weeks.  2.  Arthralgias: -He has bilateral knee pains.  He is taking tramadol 3 times a day.  Pains are not well controlled. -We will increase tramadol to every 6 hours.  3.  Hypomagnesemia: -Magnesium today is 2.  He  will continue magnesium 3 times a day.  4.  Hypertension: -Blood pressure is 123/83.  He will continue Norvasc 2.5 mg as needed.  Lisinopril is on hold.  5.  CKD: -Baseline creatinine is between 1.1 and 1.5.  Today creatinine is 1.23.  We will closely monitor. 

## 2019-09-19 ENCOUNTER — Ambulatory Visit (HOSPITAL_COMMUNITY): Payer: Medicare Other | Admitting: Hematology

## 2019-09-19 ENCOUNTER — Other Ambulatory Visit (HOSPITAL_COMMUNITY): Payer: Medicare Other

## 2019-09-24 ENCOUNTER — Other Ambulatory Visit: Payer: Self-pay

## 2019-09-24 ENCOUNTER — Inpatient Hospital Stay (HOSPITAL_BASED_OUTPATIENT_CLINIC_OR_DEPARTMENT_OTHER): Payer: Medicare Other | Admitting: Hematology

## 2019-09-24 ENCOUNTER — Inpatient Hospital Stay (HOSPITAL_COMMUNITY): Payer: Medicare Other | Attending: Hematology

## 2019-09-24 VITALS — BP 104/85 | HR 82 | Temp 97.3°F | Resp 18 | Wt 228.5 lb

## 2019-09-24 DIAGNOSIS — C9200 Acute myeloblastic leukemia, not having achieved remission: Secondary | ICD-10-CM

## 2019-09-24 DIAGNOSIS — M25562 Pain in left knee: Secondary | ICD-10-CM | POA: Diagnosis not present

## 2019-09-24 DIAGNOSIS — Z5111 Encounter for antineoplastic chemotherapy: Secondary | ICD-10-CM | POA: Insufficient documentation

## 2019-09-24 DIAGNOSIS — M25561 Pain in right knee: Secondary | ICD-10-CM | POA: Diagnosis not present

## 2019-09-24 DIAGNOSIS — Z87891 Personal history of nicotine dependence: Secondary | ICD-10-CM | POA: Insufficient documentation

## 2019-09-24 DIAGNOSIS — I129 Hypertensive chronic kidney disease with stage 1 through stage 4 chronic kidney disease, or unspecified chronic kidney disease: Secondary | ICD-10-CM | POA: Insufficient documentation

## 2019-09-24 DIAGNOSIS — Z79899 Other long term (current) drug therapy: Secondary | ICD-10-CM | POA: Diagnosis not present

## 2019-09-24 LAB — COMPREHENSIVE METABOLIC PANEL
ALT: 30 U/L (ref 0–44)
AST: 20 U/L (ref 15–41)
Albumin: 3.9 g/dL (ref 3.5–5.0)
Alkaline Phosphatase: 67 U/L (ref 38–126)
Anion gap: 11 (ref 5–15)
BUN: 20 mg/dL (ref 8–23)
CO2: 24 mmol/L (ref 22–32)
Calcium: 9.4 mg/dL (ref 8.9–10.3)
Chloride: 105 mmol/L (ref 98–111)
Creatinine, Ser: 1.07 mg/dL (ref 0.61–1.24)
GFR calc Af Amer: >60 mL/min (ref >60)
GFR calc non Af Amer: >60 mL/min (ref >60)
Glucose, Bld: 115 mg/dL — ABNORMAL HIGH (ref 70–99)
Potassium: 4.2 mmol/L (ref 3.5–5.1)
Sodium: 140 mmol/L (ref 135–145)
Total Bilirubin: 0.5 mg/dL (ref 0.3–1.2)
Total Protein: 6.2 g/dL — ABNORMAL LOW (ref 6.5–8.1)

## 2019-09-24 LAB — CBC WITH DIFFERENTIAL/PLATELET
Abs Immature Granulocytes: 0.02 10*3/uL (ref 0.00–0.07)
Basophils Absolute: 0 10*3/uL (ref 0.0–0.1)
Basophils Relative: 1 %
Eosinophils Absolute: 0 10*3/uL (ref 0.0–0.5)
Eosinophils Relative: 1 %
HCT: 45.3 % (ref 39.0–52.0)
Hemoglobin: 14.8 g/dL (ref 13.0–17.0)
Immature Granulocytes: 1 %
Lymphocytes Relative: 39 %
Lymphs Abs: 1.1 10*3/uL (ref 0.7–4.0)
MCH: 34.2 pg — ABNORMAL HIGH (ref 26.0–34.0)
MCHC: 32.7 g/dL (ref 30.0–36.0)
MCV: 104.6 fL — ABNORMAL HIGH (ref 80.0–100.0)
Monocytes Absolute: 0.5 10*3/uL (ref 0.1–1.0)
Monocytes Relative: 19 %
Neutro Abs: 1.1 10*3/uL — ABNORMAL LOW (ref 1.7–7.7)
Neutrophils Relative %: 39 %
Platelets: 136 10*3/uL — ABNORMAL LOW (ref 150–400)
RBC: 4.33 MIL/uL (ref 4.22–5.81)
RDW: 14.9 % (ref 11.5–15.5)
WBC: 2.8 10*3/uL — ABNORMAL LOW (ref 4.0–10.5)
nRBC: 0 % (ref 0.0–0.2)

## 2019-09-24 LAB — LACTATE DEHYDROGENASE: LDH: 150 U/L (ref 98–192)

## 2019-09-24 LAB — MAGNESIUM: Magnesium: 1.7 mg/dL (ref 1.7–2.4)

## 2019-09-24 NOTE — Patient Instructions (Addendum)
[place] at [facility] Discharge Instructions  You were seen today by Dr. Delton Coombes. He went over your recent lab results. If you want to have dental work done, we can push your treatment back one week. He recommended asking about removing all of the teeth for dentures verses extracting just the one tooth this is bothering you. For exercise, I would recommend a stationary bike as it would be gentle on your knees. Dr. Delton Coombes will see you back in for labs and follow up.   Thank you for choosing [place] at [facility] to provide your oncology and hematology care.  To afford each patient quality time with our provider, please arrive at least 15 minutes before your scheduled appointment time.   If you have a lab appointment with the [facility] please come in thru the  Main Entrance and check in at the main information desk  You need to re-schedule your appointment should you arrive 10 or more minutes late.  We strive to give you quality time with our providers, and arriving late affects you and other patients whose appointments are after yours.  Also, if you no show three or more times for appointments you may be dismissed from the clinic at the providers discretion.     Again, thank you for choosing [facility].  Our hope is that these requests will decrease the amount of time that you wait before being seen by our physicians.       _____________________________________________________________  Should you have questions after your visit to [facility], please contact our office at [phone] between the hours of 8:00 a.m. and 4:30 p.m.  Voicemails left after 4:00 p.m. will not be returned until the following business day.  For prescription refill requests, have your pharmacy contact our office and allow 72 hours.    Cancer Center Support Programs:   > Cancer Support Group  2nd Tuesday of the month 1pm-2pm,  Journey Room

## 2019-09-24 NOTE — Progress Notes (Signed)
[facility] [address] [place] [address]   CLINIC:  Medical Oncology/Hematology  PCP:  [name], MD [address] [place] [address]  [phone]  REASON FOR VISIT:  Follow-up for AML  CURRENT THERAPY: Decitabine and Ventoclax  INTERVAL HISTORY:  [name] 74 y.o. male returns for routine follow-up for his AML. Richard Wallace was last seen on 09/03/2019.  Denies any fevers, night sweats or weight loss.  He notes that he is currently having a loose tooth. He is interested in exercising, but he wants to lift weights.    REVIEW OF SYSTEMS:  Review of Systems  Constitutional: Positive for fatigue (severe). Negative for appetite change, chills and fever.  HENT:   Negative for lump/mass, mouth sores, sore throat and trouble swallowing.   Eyes: Negative for eye problems.  Respiratory: Negative for chest tightness, cough, shortness of breath and wheezing.   Cardiovascular: Negative for chest pain and palpitations.  Gastrointestinal: Negative for abdominal pain, constipation, diarrhea, nausea and vomiting.  Genitourinary: Negative for bladder incontinence, dysuria, frequency and hematuria.   Musculoskeletal: Negative for arthralgias, back pain, flank pain and myalgias.  Skin: Negative for rash.  Neurological: Negative for dizziness, headaches, light-headedness and numbness.  Hematological: Does not bruise/bleed easily.  Psychiatric/Behavioral: Negative for depression. The patient is not nervous/anxious.     PAST MEDICAL/SURGICAL HISTORY:  Past Medical History:  Diagnosis Date  . Arthritis   . 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 Cancer ([place]) 01-28-2014   skin cancer  . CKD (chronic kidney disease), stage III   . GERD (gastroesophageal reflux disease)   . Heart murmur    NOTED DURING PHYSICAL WHEN HE WAS ENLISTING IN MIL[facility]ARY , DIDNT KNOW UNTIL THAT TIME AND REPORTS , "THATS THE LAST I HEARD ABOUT IT "   . History of hypertension     no longer issue  . History of kidney stones   . History of malignant melanoma of skin    excision top of scalp 2015-- no recurrence  . History of urinary retention    post op lumbar fusion surgery 04/ 2016  . Hypertension   . 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 Left ureteral calculus   . Seasonal allergies   . Wears glasses   . Wears glasses   . Wears partial dentures    upper and lower   Past Surgical History:  Procedure Laterality Date  . ANKLE FUSION Right 2007  . CARPAL TUNNEL RELEASE Left 12/28/2009   w/ pulley release left long finger  . CARPAL TUNNEL RELEASE Right 07/22/2013   Procedure: RIGHT CARPAL TUNNEL RELEASE;  Surgeon: Cammie Sickle., MD;  Location: [facility];  Service: Orthopedics;  Laterality: Right;  . COLONOSCOPY    . CYSTO/  LEFT RETROGRADE PYELOGRAM  11/21/2010  . CYSTOSCOPY WITH STENT PLACEMENT Left 03/09/2016   Procedure: CYSTOSCOPY WITH STENT PLACEMENT;  Surgeon: Franchot Gallo, MD;  Location[facility];  Service: Urology;  Laterality: Left;  . CYSTOSCOPY/RETROGRADE/URETEROSCOPY/STONE EXTRACTION WITH BASKET Left 03/09/2016   Procedure: CYSTOSCOPY/RETROGRADE/URETEROSCOPY/STONE EXTRACTION WITH BASKET;  Surgeon: Franchot Gallo, MD;  Location: [facility];  Service: Urology;  Laterality: Left;  . LEFT URETEROSCOPIC LASER LITHOTRIPSY STONE EXTRACTION/ STENT PLACEMENT  05/23/2010  . MOHS SURGERY     TOP OF THE HEAD   . ORIF ANKLE FRACTURE Right 1978  .  PORT-A-CATH REMOVAL Right 02/14/2019   Procedure: MINOR REMOVAL PORT-A-CATH;  Surgeon: Aviva Signs, MD;  Location: [facility];  Service: General;  Laterality: Right;  . PORTACATH PLACEMENT Right 08/19/2018   Procedure: INSERTION PORT-A-CATH (attached catheter in right subclavian);  Surgeon: Aviva Signs, MD;  Location: [facility];  Service: General;  Laterality: Right;  . PORTACATH PLACEMENT  Left 05/16/2019   Procedure: INSERTION PORT-A-CATH (attached catheter in left subclavian);  Surgeon: Aviva Signs, MD;  Location: [facility];  Service: General;  Laterality: Left;  . POSTERIOR LUMBAR FUSION  08/21/2014   laminectomy and decompression L2 -- L5  . RIGHT LOWER LEG SURGERY  X3  1975 to 1976   including ORIF  . TONSILLECTOMY AND ADENOIDECTOMY  1986  . UMBILICAL HERNIA REPAIR  2009 approx    SOCIAL HISTORY:  Social History   Socioeconomic History  . Marital status: Married    Spouse name: Not on file  . Number of children: Not on file  . Years of education: Not on file  . Highest education level: Not on file  Occupational History  . Not on file  Tobacco Use  . Smoking status: Former Smoker    Years: 20.00    Types: Cigarettes    Quit date: 07/17/1986    Years since quitting: 33.2  . Smokeless tobacco: Never Used  Substance and Sexual Activity  . Alcohol use: Yes    Alcohol/week: 7.0 - 14.0 standard drinks    Types: 7 - 14 Cans of beer per week    Comment: 1 -2 beer daily  . Drug use: No  . Sexual activity: Not Currently  Other Topics Concern  . Not on file  Social History Narrative  . Not on file   Social Determinants of Health   Financial Resource Strain: Unknown  . Difficulty of Paying Living Expenses: Patient refused  Food Insecurity: Unknown  . Worried About Charity fundraiser in the Last Year: Patient refused  . Ran Out of Food in the Last Year: Patient refused  Transportation Needs: Unknown  . Lack of Transportation (Medical): Patient refused  . Lack of Transportation (Non-Medical): Patient refused  Physical Activity: Unknown  . Days of Exercise per Week: Patient refused  . Minutes of Exercise per Session: Patient refused  Stress: Unknown  . Feeling of Stress : Patient refused  Social Connections: Unknown  . Frequency of Communication with Friends and Family: Patient refused  . Frequency of Social Gatherings with Friends and Family: Patient refused   . Attends Religious Services: Patient refused  . Active Member of Clubs or Organizations: Patient refused  . Attends Archivist Meetings: Patient refused  . Marital Status: Patient refused  Intimate Partner Violence: Unknown  . Fear of Current or Ex-Partner: Patient refused  . Emotionally Abused: Patient refused  . Physically Abused: Patient refused  . Sexually Abused: Patient refused    FAMILY HISTORY:  Family History  Problem Relation Age of Onset  . Stroke Mother   . Prostate cancer Father   . Bone cancer Father   . Diverticulitis Father   . Rheum arthritis Sister   . Urinary tract infection Sister   . Colon cancer Neg Hx     CURRENT MEDICATIONS:  Current Outpatient Medications  Medication Sig Dispense Refill  . allopurinol (ZYLOPRIM) 300 MG tablet Take 300 mg by mouth at bedtime.     Marland Kitchen amLODipine (NORVASC) 2.5 MG tablet Take 1 tablet by mouth once daily 30 tablet 6  . cetirizine (  ZYRTEC) 10 MG tablet Take 10 mg by mouth daily. IN THE MORNING    . Cholecalciferol (VITAMIN D3) 1.25 MG (50000 UT) CAPS Take 1 capsule by mouth daily.    Marland Kitchen docusate sodium (COLACE) 100 MG capsule Take 100 mg by mouth at bedtime.     . lansoprazole (PREVACID) 15 MG capsule Take 15 mg by mouth daily.     . magnesium oxide (MAG-OX) 400 MG tablet Take 1 tablet (400 mg total) by mouth 3 (three) times daily. 90 tablet 2  . tamsulosin (FLOMAX) 0.4 MG CAPS capsule Take 0.4 mg by mouth every evening.     . venetoclax 100 MG TABS Take 200 mg by mouth daily. 60 tablet 0  . acetaminophen (TYLENOL) 500 MG tablet Take 1,000 mg by mouth 3 (three) times daily.     . diclofenac sodium (VOLTAREN) 1 % GEL Apply 1 g topically 3 (three) times daily as needed (knee pain.).     Marland Kitchen lidocaine-prilocaine (EMLA) cream Apply 1 application topically See admin instructions. ONE HOUR PRIOR TO CHEMOTHERAPY APPOINTMENT    . traMADol (ULTRAM) 50 MG tablet Take 1 tablet (50 mg total) by mouth every 6 (six) hours as  needed. for pain (Patient not taking: Reported on 09/24/2019) 120 tablet 0   No current facility-administered medications for this visit.   Facility-Administered Medications Ordered in Other Visits  Medication Dose Route Frequency Provider Last Rate Last Admin  . sodium chloride flush (NS) 0.9 % injection 20 mL  20 mL Intravenous PRN [name], MD   20 mL at 07/21/19 1053    ALLERGIES:  No Known Allergies  PHYSICAL EXAM:  Performance status (ECOG): 1 - Symptomatic but completely ambulatory  Vitals:   09/24/19 1156  BP: 104/85  Pulse: 82  Resp: 18  Temp: (!) 97.3 F (36.3 C)  SpO2: 94%   Wt Readings from Last 3 Encounters:  09/24/19 228 lb 8 oz (103.6 kg)  09/03/19 222 lb 3.2 oz (100.8 kg)  08/18/19 225 lb 3.2 oz (102.2 kg)   Physical Exam Vitals reviewed.  Constitutional:      Appearance: Normal appearance.  Cardiovascular:     Rate and Rhythm: Normal rate and regular rhythm.     Heart sounds: Normal heart sounds.  Pulmonary:     Effort: Pulmonary effort is normal.     Breath sounds: Normal breath sounds.  Abdominal:     Palpations: Abdomen is soft.  Neurological:     General: No focal deficit present.     Mental Status: He is alert and oriented to person, place, and time.  Psychiatric:        Mood and Affect: Mood normal.        Behavior: Behavior normal.     LABORATORY DATA:  I have reviewed the labs as listed.  CBC Latest Ref Rng & Units 09/24/2019 09/03/2019 08/18/2019  WBC 4.0 - 10.5 K/uL 2.8(L) 1.7(L) 4.2  Hemoglobin 13.0 - 17.0 g/dL 14.8 14.0 14.1  Hematocrit 39.0 - 52.0 % 45.3 43.2 44.0  Platelets 150 - 400 K/uL 136(L) 87(L) 154   CMP Latest Ref Rng & Units 09/24/2019 09/03/2019 08/18/2019  Glucose 70 - 99 mg/dL 115(H) 98 87  BUN 8 - 23 mg/dL 20 30(H) 32(H)  Creatinine 0.61 - 1.24 mg/dL 1.07 1.23 1.16  Sodium 135 - 145 mmol/L 140 143 141  Potassium 3.5 - 5.1 mmol/L 4.2 4.9 4.1  Chloride 98 - 111 mmol/L 105 108 107  CO2 22 - 32 mmol/L  24 25  25   Calcium 8.9 - 10.3 mg/dL 9.4 10.1 9.5  Total Protein 6.5 - 8.1 g/dL 6.2(L) 6.7 6.4(L)  Total Bilirubin 0.3 - 1.2 mg/dL 0.5 0.8 0.8  Alkaline Phos 38 - 126 U/L 67 57 59  AST 15 - 41 U/L 20 25 20   ALT 0 - 44 U/L 30 39 30       Component Value Date/Time   RBC 4.33 09/24/2019 1029   MCV 104.6 (H) 09/24/2019 1029   MCH 34.2 (H) 09/24/2019 1029   MCHC 32.7 09/24/2019 1029   RDW 14.9 09/24/2019 1029   LYMPHSABS 1.1 09/24/2019 1029   MONOABS 0.5 09/24/2019 1029   EOSABS 0.0 09/24/2019 1029   BASOSABS 0.0 09/24/2019 1029    DIAGNOSTIC IMAGING:  I have reviewed the scans.   ASSESSMENT & PLAN:  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[facility]) 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 -BM BX on 02/25/2019 after cycle 1 did not show any evidence of leukemia. -Venetoclax was discontinued on 09/06/2019.  He feels good.  He is having trouble with lower incisor. -We reviewed his labs.  ANC is 1100.  Platelet count is normal.  Hemoglobin is 14.8. -He would like to see his dentist as his tooth is ready to fall off.  If he needs extractions, we will likely delay his next treatment.  If not he will start his next cycle next Monday.  2.  Arthralgias: -He has bilateral knee pains for long duration. -We will continue tramadol every 6 hours.  Pain is reasonably well controlled.  3.  Hypomagnesemia: -Magnesium is 1.7.  Continue magnesium 3 times a day.  4.  Hypertension: -Blood pressure today is 140/85.  Continue Norvasc 2.5 mg daily.  Lisinopril is on hold.  5.  CKD: -Creatinine improved to 1.07.  Baseline is anywhere between 1.1-1.5.    Orders placed this encounter:  Orders Placed This Encounter  Procedures  . CBC with Differential/Platelet  . Comprehensive metabolic panel  . Magnesium  . Lactate dehydrogenase       [name], MD, 09/26/19 4:28 PM  [place] [phone]   I, Jacqualyn Posey, am acting as a scribe for Dr. Sanda Linger.  I, [name] MD, have reviewed the above documentation for accuracy and completeness, and I agree with the above.

## 2019-09-24 NOTE — Assessment & Plan Note (Signed)
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 -BM BX on 02/25/2019 after cycle 1 did not show any evidence of leukemia. -Venetoclax was discontinued on 09/06/2019.  He feels good.  He is having trouble with lower incisor. -We reviewed his labs.  ANC is 1100.  Platelet count is normal.  Hemoglobin is 14.8. -He would like to see his dentist as his tooth is ready to fall off.  If he needs extractions, we will likely delay his next treatment.  If not he will start his next cycle next Monday.  2.  Arthralgias: -He has bilateral knee pains for long duration. -We will continue tramadol every 6 hours.  Pain is reasonably well controlled.  3.  Hypomagnesemia: -Magnesium is 1.7.  Continue magnesium 3 times a day.  4.  Hypertension: -Blood pressure today is 140/85.  Continue Norvasc 2.5 mg daily.  Lisinopril is on hold.  5.  CKD: -Creatinine improved to 1.07.  Baseline is anywhere between 1.1-1.5.

## 2019-09-26 NOTE — Progress Notes (Signed)

## 2019-09-29 ENCOUNTER — Inpatient Hospital Stay (HOSPITAL_BASED_OUTPATIENT_CLINIC_OR_DEPARTMENT_OTHER): Payer: Medicare Other | Admitting: Hematology

## 2019-09-29 ENCOUNTER — Encounter (HOSPITAL_COMMUNITY): Payer: Self-pay | Admitting: Hematology

## 2019-09-29 ENCOUNTER — Other Ambulatory Visit (HOSPITAL_COMMUNITY): Payer: Self-pay | Admitting: *Deleted

## 2019-09-29 ENCOUNTER — Other Ambulatory Visit: Payer: Self-pay

## 2019-09-29 ENCOUNTER — Inpatient Hospital Stay (HOSPITAL_COMMUNITY): Payer: Medicare Other

## 2019-09-29 VITALS — BP 142/96 | HR 72 | Temp 98.2°F | Resp 18

## 2019-09-29 DIAGNOSIS — D46Z Other myelodysplastic syndromes: Secondary | ICD-10-CM

## 2019-09-29 DIAGNOSIS — C9200 Acute myeloblastic leukemia, not having achieved remission: Secondary | ICD-10-CM

## 2019-09-29 DIAGNOSIS — Z5111 Encounter for antineoplastic chemotherapy: Secondary | ICD-10-CM | POA: Diagnosis not present

## 2019-09-29 DIAGNOSIS — Z95828 Presence of other vascular implants and grafts: Secondary | ICD-10-CM

## 2019-09-29 LAB — CBC WITH DIFFERENTIAL/PLATELET
Abs Immature Granulocytes: 0.03 10*3/uL (ref 0.00–0.07)
Basophils Absolute: 0 10*3/uL (ref 0.0–0.1)
Basophils Relative: 1 %
Eosinophils Absolute: 0 10*3/uL (ref 0.0–0.5)
Eosinophils Relative: 1 %
HCT: 46.2 % (ref 39.0–52.0)
Hemoglobin: 14.6 g/dL (ref 13.0–17.0)
Immature Granulocytes: 1 %
Lymphocytes Relative: 32 %
Lymphs Abs: 1.3 10*3/uL (ref 0.7–4.0)
MCH: 33.2 pg (ref 26.0–34.0)
MCHC: 31.6 g/dL (ref 30.0–36.0)
MCV: 105 fL — ABNORMAL HIGH (ref 80.0–100.0)
Monocytes Absolute: 0.6 10*3/uL (ref 0.1–1.0)
Monocytes Relative: 15 %
Neutro Abs: 2 10*3/uL (ref 1.7–7.7)
Neutrophils Relative %: 50 %
Platelets: 139 10*3/uL — ABNORMAL LOW (ref 150–400)
RBC: 4.4 MIL/uL (ref 4.22–5.81)
RDW: 15.1 % (ref 11.5–15.5)
WBC: 3.9 10*3/uL — ABNORMAL LOW (ref 4.0–10.5)
nRBC: 0 % (ref 0.0–0.2)

## 2019-09-29 LAB — LACTATE DEHYDROGENASE: LDH: 165 U/L (ref 98–192)

## 2019-09-29 LAB — COMPREHENSIVE METABOLIC PANEL
ALT: 26 U/L (ref 0–44)
AST: 21 U/L (ref 15–41)
Albumin: 3.9 g/dL (ref 3.5–5.0)
Alkaline Phosphatase: 59 U/L (ref 38–126)
Anion gap: 9 (ref 5–15)
BUN: 20 mg/dL (ref 8–23)
CO2: 26 mmol/L (ref 22–32)
Calcium: 9.6 mg/dL (ref 8.9–10.3)
Chloride: 105 mmol/L (ref 98–111)
Creatinine, Ser: 1.03 mg/dL (ref 0.61–1.24)
GFR calc Af Amer: >60 mL/min (ref >60)
GFR calc non Af Amer: >60 mL/min (ref >60)
Glucose, Bld: 99 mg/dL (ref 70–99)
Potassium: 4.4 mmol/L (ref 3.5–5.1)
Sodium: 140 mmol/L (ref 135–145)
Total Bilirubin: 0.6 mg/dL (ref 0.3–1.2)
Total Protein: 6.4 g/dL — ABNORMAL LOW (ref 6.5–8.1)

## 2019-09-29 LAB — MAGNESIUM: Magnesium: 1.7 mg/dL (ref 1.7–2.4)

## 2019-09-29 MED ORDER — SODIUM CHLORIDE 0.9 % IV SOLN: Freq: Once | INTRAVENOUS | Status: AC

## 2019-09-29 MED ORDER — HEPARIN SOD (PORK) LOCK FLUSH 100 UNIT/ML IV SOLN
500.0000 [IU] | Freq: Once | INTRAVENOUS | Status: AC | PRN
  Administered 2019-09-29: 500 [IU]

## 2019-09-29 MED ORDER — CYANOCOBALAMIN 1000 MCG/ML IJ SOLN
INTRAMUSCULAR | Status: AC
  Filled 2019-09-29: qty 1

## 2019-09-29 MED ORDER — AMLODIPINE BESYLATE 2.5 MG PO TABS: 2.5000 mg | ORAL_TABLET | Freq: Every day | ORAL | 2 refills | Status: DC

## 2019-09-29 MED ORDER — SODIUM CHLORIDE 0.9% FLUSH
10.0000 mL | INTRAVENOUS | Status: DC | PRN
  Administered 2019-09-29: 10 mL

## 2019-09-29 MED ORDER — SODIUM CHLORIDE 0.9 % IV SOLN
15.0000 mg/m2 | Freq: Once | INTRAVENOUS | Status: AC
  Administered 2019-09-29: 35 mg via INTRAVENOUS
  Filled 2019-09-29: qty 7

## 2019-09-29 MED ORDER — SODIUM CHLORIDE 0.9 % IV SOLN
8.0000 mg | Freq: Once | INTRAVENOUS | Status: AC
  Administered 2019-09-29: 8 mg via INTRAVENOUS
  Filled 2019-09-29: qty 4

## 2019-09-29 NOTE — Progress Notes (Signed)
Patient has been assessed, vital signs and labs have been reviewed by Dr. Katragadda. ANC, Creatinine, LFTs, and Platelets are within treatment parameters per Dr. Katragadda. The patient is good to proceed with treatment at this time.  

## 2019-09-29 NOTE — Progress Notes (Signed)
Richard [address] Richard [place]   CLINIC:  Medical Oncology/Hematology  PCP:  [name], MD [address] [place] [address]  [phone]  REASON FOR VISIT:  Follow-up for AML  CURRENT THERAPY: Decitabine and Ventoclax  INTERVAL HISTORY:  Richard Wallace 74 y.o. male seen for follow-up of AML.  Denies any fevers or chills.  He had his tooth pulled last week.  No excessive bleeding.  No pain in the jaw reported.  Arthritic pain is very well controlled.  He is taking tramadol four times a day.  Appetite is 100%.  Energy levels are 50%.  REVIEW OF SYSTEMS:  Review of Systems  Constitutional: Positive for fatigue (severe). Negative for appetite change, chills and fever.  HENT:   Negative for lump/mass, mouth sores, sore throat and trouble swallowing.   Eyes: Negative for eye problems.  Respiratory: Negative for chest tightness, cough, shortness of breath and wheezing.   Cardiovascular: Negative for chest pain and palpitations.  Gastrointestinal: Negative for abdominal pain, constipation, diarrhea, nausea and vomiting.  Genitourinary: Negative for bladder incontinence, dysuria, frequency and hematuria.   Musculoskeletal: Negative for arthralgias, back pain, flank pain and myalgias.  Skin: Negative for rash.  Neurological: Negative for dizziness, headaches, light-headedness and numbness.  Hematological: Does not bruise/bleed easily.  Psychiatric/Behavioral: Negative for depression. The patient is not nervous/anxious.     PAST MEDICAL/SURGICAL HISTORY:  Past Medical History:  Diagnosis Date  . Arthritis   . 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 Cancer ([place]) 01-28-2014   skin cancer  . CKD (chronic kidney disease), stage III   . GERD (gastroesophageal reflux disease)   . Heart murmur    NOTED DURING PHYSICAL WHEN HE WAS ENLISTING IN MILITARY , DIDNT KNOW UNTIL THAT TIME AND REPORTS , "THATS THE LAST I HEARD ABOUT IT "     . History of hypertension    no longer issue  . History of kidney stones   . History of malignant melanoma of skin    excision top of scalp 2015-- no recurrence  . History of urinary retention    post op lumbar fusion surgery 04/ 2016  . Hypertension   . 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 Left ureteral calculus   . Seasonal allergies   . Wears glasses   . Wears glasses   . Wears partial dentures    upper and lower   Past Surgical History:  Procedure Laterality Date  . ANKLE FUSION Right 2007  . CARPAL TUNNEL RELEASE Left 12/28/2009   w/ pulley release left long finger  . CARPAL TUNNEL RELEASE Right 07/22/2013   Procedure: RIGHT CARPAL TUNNEL RELEASE;  Surgeon: Cammie Sickle., MD;  Location: [place];  Service: Orthopedics;  Laterality: Right;  . COLONOSCOPY    . CYSTO/  LEFT RETROGRADE PYELOGRAM  11/21/2010  . CYSTOSCOPY WITH STENT PLACEMENT Left 03/09/2016   Procedure: CYSTOSCOPY WITH STENT PLACEMENT;  Surgeon: Franchot Gallo, MD;  Location: [facility];  Service: Urology;  Laterality: Left;  . CYSTOSCOPY/RETROGRADE/URETEROSCOPY/STONE EXTRACTION WITH BASKET Left 03/09/2016   Procedure: CYSTOSCOPY/RETROGRADE/URETEROSCOPY/STONE EXTRACTION WITH BASKET;  Surgeon: Franchot Gallo, MD;  Location: [facility];  Service: Urology;  Laterality: Left;  . LEFT URETEROSCOPIC LASER LITHOTRIPSY STONE EXTRACTION/ STENT PLACEMENT  05/23/2010  . MOHS SURGERY     TOP OF  THE HEAD   . ORIF ANKLE FRACTURE Right 1978  . PORT-A-CATH REMOVAL Right 02/14/2019   Procedure: MINOR REMOVAL PORT-A-CATH;  Surgeon: Aviva Signs, MD;  Location: [facility];  Service: General;  Laterality: Right;  . PORTACATH PLACEMENT Right 08/19/2018   Procedure: INSERTION PORT-A-CATH (attached catheter in right subclavian);  Surgeon: Aviva Signs, MD;  Location: [facility];  Service: General;  Laterality:  Right;  . PORTACATH PLACEMENT Left 05/16/2019   Procedure: INSERTION PORT-A-CATH (attached catheter in left subclavian);  Surgeon: Aviva Signs, MD;  Location: [facility];  Service: General;  Laterality: Left;  . POSTERIOR LUMBAR FUSION  08/21/2014   laminectomy and decompression L2 -- L5  . RIGHT LOWER LEG SURGERY  X3  1975 to 1976   including ORIF  . TONSILLECTOMY AND ADENOIDECTOMY  1986  . UMBILICAL HERNIA REPAIR  2009 approx    SOCIAL HISTORY:  Social History   Socioeconomic History  . Marital status: Married    Spouse name: Not on file  . Number of children: Not on file  . Years of education: Not on file  . Highest education level: Not on file  Occupational History  . Not on file  Tobacco Use  . Smoking status: Former Smoker    Years: 20.00    Types: Cigarettes    Quit date: 07/17/1986    Years since quitting: 33.2  . Smokeless tobacco: Never Used  Substance and Sexual Activity  . Alcohol use: Yes    Alcohol/week: 7.0 - 14.0 standard drinks    Types: 7 - 14 Cans of beer per week    Comment: 1 -2 beer daily  . Drug use: No  . Sexual activity: Not Currently  Other Topics Concern  . Not on file  Social History Narrative  . Not on file   Social Determinants of Health   Financial Resource Strain: Unknown  . Difficulty of Paying Living Expenses: Patient refused  Food Insecurity: Unknown  . Worried About Charity fundraiser in the Last Year: Patient refused  . Ran Out of Food in the Last Year: Patient refused  Transportation Needs: Unknown  . Lack of Transportation (Medical): Patient refused  . Lack of Transportation (Non-Medical): Patient refused  Physical Activity: Unknown  . Days of Exercise per Week: Patient refused  . Minutes of Exercise per Session: Patient refused  Stress: Unknown  . Feeling of Stress : Patient refused  Social Connections: Unknown  . Frequency of Communication with Friends and Family: Patient refused  . Frequency of Social Gatherings with  Friends and Family: Patient refused  . Attends Religious Services: Patient refused  . Active Member of Clubs or Organizations: Patient refused  . Attends Archivist Meetings: Patient refused  . Marital Status: Patient refused  Intimate Partner Violence: Unknown  . Fear of Current or Ex-Partner: Patient refused  . Emotionally Abused: Patient refused  . Physically Abused: Patient refused  . Sexually Abused: Patient refused    FAMILY HISTORY:  Family History  Problem Relation Age of Onset  . Stroke Mother   . Prostate cancer Father   . Bone cancer Father   . Diverticulitis Father   . Rheum arthritis Sister   . Urinary tract infection Sister   . Colon cancer Neg Hx     CURRENT MEDICATIONS:  Current Outpatient Medications  Medication Sig Dispense Refill  . acetaminophen (TYLENOL) 500 MG tablet Take 1,000 mg by mouth 3 (three) times daily.     Marland Kitchen allopurinol (ZYLOPRIM) 300 MG  tablet Take 300 mg by mouth at bedtime.     Marland Kitchen amLODipine (NORVASC) 2.5 MG tablet Take 1 tablet by mouth once daily 30 tablet 6  . cetirizine (ZYRTEC) 10 MG tablet Take 10 mg by mouth daily. IN THE MORNING    . Cholecalciferol (VITAMIN D3) 1.25 MG (50000 UT) CAPS Take 1 capsule by mouth daily.    Marland Kitchen docusate sodium (COLACE) 100 MG capsule Take 100 mg by mouth at bedtime.     . lansoprazole (PREVACID) 15 MG capsule Take 15 mg by mouth daily.     Marland Kitchen lidocaine-prilocaine (EMLA) cream Apply 1 application topically See admin instructions. ONE HOUR PRIOR TO CHEMOTHERAPY APPOINTMENT    . magnesium oxide (MAG-OX) 400 MG tablet Take 1 tablet (400 mg total) by mouth 3 (three) times daily. 90 tablet 2  . tamsulosin (FLOMAX) 0.4 MG CAPS capsule Take 0.4 mg by mouth every evening.     . traMADol (ULTRAM) 50 MG tablet Take 1 tablet (50 mg total) by mouth every 6 (six) hours as needed. for pain 120 tablet 0  . venetoclax 100 MG TABS Take 200 mg by mouth daily. 60 tablet 0  . diclofenac sodium (VOLTAREN) 1 % GEL Apply 1 g  topically 3 (three) times daily as needed (knee pain.).      No current facility-administered medications for this visit.   Facility-Administered Medications Ordered in Other Visits  Medication Dose Route Frequency Provider Last Rate Last Admin  . sodium chloride flush (NS) 0.9 % injection 20 mL  20 mL Intravenous PRN [name], MD   20 mL at 07/21/19 1053    ALLERGIES:  No Known Allergies  PHYSICAL EXAM:  Performance status (ECOG): 1 - Symptomatic but completely ambulatory  Vitals:   09/29/19 0936  BP: (!) 150/82  Pulse: 82  Resp: 19  Temp: (!) 97.3 F (36.3 C)  SpO2: 95%   Wt Readings from Last 3 Encounters:  09/29/19 227 lb 14.4 oz (103.4 kg)  09/24/19 228 lb 8 oz (103.6 kg)  09/03/19 222 lb 3.2 oz (100.8 kg)   Physical Exam Vitals reviewed.  Constitutional:      Appearance: Normal appearance.  Cardiovascular:     Rate and Rhythm: Normal rate and regular rhythm.     Heart sounds: Normal heart sounds.  Pulmonary:     Effort: Pulmonary effort is normal.     Breath sounds: Normal breath sounds.  Abdominal:     Palpations: Abdomen is soft.  Neurological:     General: No focal deficit present.     Mental Status: He is alert and oriented to person, place, and time.  Psychiatric:        Mood and Affect: Mood normal.        Behavior: Behavior normal.     LABORATORY DATA:  I have reviewed the labs as listed.  CBC Latest Ref Rng & Units 09/29/2019 09/24/2019 09/03/2019  WBC 4.0 - 10.5 K/uL 3.9(L) 2.8(L) 1.7(L)  Hemoglobin 13.0 - 17.0 g/dL 14.6 14.8 14.0  Hematocrit 39.0 - 52.0 % 46.2 45.3 43.2  Platelets 150 - 400 K/uL 139(L) 136(L) 87(L)   CMP Latest Ref Rng & Units 09/29/2019 09/24/2019 09/03/2019  Glucose 70 - 99 mg/dL 99 115(H) 98  BUN 8 - 23 mg/dL 20 20 30(H)  Creatinine 0.61 - 1.24 mg/dL 1.03 1.07 1.23  Sodium 135 - 145 mmol/L 140 140 143  Potassium 3.5 - 5.1 mmol/L 4.4 4.2 4.9  Chloride 98 - 111 mmol/L 105 105  108  CO2 22 - 32 mmol/L 26 24 25     Calcium 8.9 - 10.3 mg/dL 9.6 9.4 10.1  Total Protein 6.5 - 8.1 g/dL 6.4(L) 6.2(L) 6.7  Total Bilirubin 0.3 - 1.2 mg/dL 0.6 0.5 0.8  Alkaline Phos 38 - 126 U/L 59 67 57  AST 15 - 41 U/L 21 20 25   ALT 0 - 44 U/L 26 30 39       Component Value Date/Time   RBC 4.40 09/29/2019 0905   MCV 105.0 (H) 09/29/2019 0905   MCH 33.2 09/29/2019 0905   MCHC 31.6 09/29/2019 0905   RDW 15.1 09/29/2019 0905   LYMPHSABS 1.3 09/29/2019 0905   MONOABS 0.6 09/29/2019 0905   EOSABS 0.0 09/29/2019 0905   BASOSABS 0.0 09/29/2019 0905    DIAGNOSTIC IMAGING:  I have reviewed the scans.   ASSESSMENT:  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 -BMBX on 02/25/2019 after cycle 1 did not show any evidence of leukemia. -CT CAP on 02/12/2019 shows numerous bilateral irregular/spiculated pulmonary nodules measuring up to 14 mm, nonspecific.  Hepatic steatosis.  Spleen is normal.  PLAN:  1.  AML: -I have reviewed his labs today.  White count is 3.9 and ANC is normal.  Platelet count is 139.  LFTs are normal. -He may proceed with cycle 7-day 1 today. -He was told to start venetoclax 200 mg daily today.  I will see him back in 2 weeks for follow-up and repeat blood counts.  2.  Arthralgias: -Continue tramadol every 6 hours which is helping.  3.  Hypomagnesemia: -Magnesium is normal.  Continue magnesium 3 times a day at home.  4.  Hypertension: -Blood pressure is 150/82.  Continue Norvasc 2.5 mg daily.  Lisinopril on hold.  5.  CKD: -Creatinine has improved to 1.03.  This has improved since we held his lisinopril.  Baseline is 1.1-1.5.     Orders placed this encounter:  No orders of the defined types were placed in this encounter.      [name], MD, 09/29/19 9:42 AM  [place] [phone]   I, Jacqualyn Posey, am acting as a scribe for Dr. Sanda Linger.  I, [name] MD, have reviewed the above documentation for accuracy and completeness, and I agree with the above.

## 2019-09-29 NOTE — Progress Notes (Signed)
Patient tolerated chemotherapy with no complaints voiced.  Side effects with management reviewed with understanding verbalized.  Port site clean and dry with no bruising or swelling noted at site.  Good blood return noted before and after administration of chemotherapy.  Dressing intact.  Patient left ambulatory with VSS and no s/s of distress noted.  

## 2019-09-29 NOTE — Assessment & Plan Note (Signed)
1.  Acute myeloid leukemia: A 6 cycles of decitabine (every 6 weeks) and venetoclax (2 weeks) from 01/20/2019 through 08/18/2019. -BMBX on 02/25/2019 after cycle 1 did not show any evidence of leukemia. -I reviewed his CBC today.  White count is 3.9 with ANC of 2000.  Platelet count is 139.  Hemoglobin is 14.6.  LFTs are normal.  2.  Arthralgias: -Bilateral knee pains for long duration.  Continue tramadol every 6 hours which is helping.  3.  Hypomagnesemia: -Magnesium is 1.7 today.  Continue magnesium 3 times daily.  4.  Hypertension: -Blood pressure today is 150/82.  Continue Norvasc 2.5 mg daily.  Lisinopril on hold.  5.  CKD: -Creatinine has improved to 1.03 since we held his lisinopril.  Baseline is anywhere between 1.1-1.5.

## 2019-09-29 NOTE — Patient Instructions (Addendum)
[place] at [facility] Discharge Instructions  You were seen today by Dr. Delton Coombes. He went over your recent lab results. Restart the Venetoclax today with two pills daily. He will see you back in 2 weeks for labs and follow up.   Thank you for choosing [place] at [facility] to provide your oncology and hematology care.  To afford each patient quality time with our provider, please arrive at least 15 minutes before your scheduled appointment time.   If you have a lab appointment with the [facility] please come in thru the  Main Entrance and check in at the main information desk  You need to re-schedule your appointment should you arrive 10 or more minutes late.  We strive to give you quality time with our providers, and arriving late affects you and other patients whose appointments are after yours.  Also, if you no show three or more times for appointments you may be dismissed from the clinic at the providers discretion.     Again, thank you for choosing [facility].  Our hope is that these requests will decrease the amount of time that you wait before being seen by our physicians.       _____________________________________________________________  Should you have questions after your visit to [facility], please contact our office at [phone] between the hours of 8:00 a.m. and 4:30 p.m.  Voicemails left after 4:00 p.m. will not be returned until the following business day.  For prescription refill requests, have your pharmacy contact our office and allow 72 hours.    Cancer Center Support Programs:   > Cancer Support Group  2nd Tuesday of the month 1pm-2pm, Journey Room

## 2019-09-30 ENCOUNTER — Inpatient Hospital Stay (HOSPITAL_COMMUNITY): Payer: Medicare Other

## 2019-09-30 VITALS — BP 130/81 | HR 68 | Temp 97.7°F | Resp 18

## 2019-09-30 DIAGNOSIS — D46Z Other myelodysplastic syndromes: Secondary | ICD-10-CM

## 2019-09-30 DIAGNOSIS — Z95828 Presence of other vascular implants and grafts: Secondary | ICD-10-CM

## 2019-09-30 DIAGNOSIS — Z5111 Encounter for antineoplastic chemotherapy: Secondary | ICD-10-CM | POA: Diagnosis not present

## 2019-09-30 DIAGNOSIS — C9200 Acute myeloblastic leukemia, not having achieved remission: Secondary | ICD-10-CM

## 2019-09-30 MED ORDER — SODIUM CHLORIDE 0.9 % IV SOLN
15.0000 mg/m2 | Freq: Once | INTRAVENOUS | Status: AC
  Administered 2019-09-30: 35 mg via INTRAVENOUS
  Filled 2019-09-30: qty 7

## 2019-09-30 MED ORDER — HEPARIN SOD (PORK) LOCK FLUSH 100 UNIT/ML IV SOLN
500.0000 [IU] | Freq: Once | INTRAVENOUS | Status: AC | PRN
  Administered 2019-09-30: 500 [IU]

## 2019-09-30 MED ORDER — SODIUM CHLORIDE 0.9 % IV SOLN
8.0000 mg | Freq: Once | INTRAVENOUS | Status: AC
  Administered 2019-09-30: 8 mg via INTRAVENOUS
  Filled 2019-09-30: qty 4

## 2019-09-30 MED ORDER — SODIUM CHLORIDE 0.9% FLUSH
10.0000 mL | INTRAVENOUS | Status: DC | PRN
  Administered 2019-09-30: 10 mL

## 2019-09-30 MED ORDER — SODIUM CHLORIDE 0.9 % IV SOLN: Freq: Once | INTRAVENOUS | Status: AC

## 2019-09-30 NOTE — Patient Instructions (Signed)
[facility] Discharge Instructions for Patients Receiving Chemotherapy   Beginning January 23rd 2017 lab work for the Cancer Center will be done in the  Main lab at [place] on 1st floor. If you have a lab appointment with the Cancer Center please come in thru the  Main Entrance and check in at the main information desk   Today you received the following chemotherapy agents Decitabine  To help prevent nausea and vomiting after your treatment, we encourage you to take your nausea medication    If you develop nausea and vomiting, or diarrhea that is not controlled by your medication, call the clinic.  The clinic phone number is [phone]. Office hours are Monday-Friday 8:30am-5:00pm.  BELOW ARE SYMPTOMS THAT SHOULD BE REPORTED IMMEDIATELY:  *FEVER GREATER THAN 101.0 F  *CHILLS WITH OR WITHOUT FEVER  NAUSEA AND VOMITING THAT IS NOT CONTROLLED WITH YOUR NAUSEA MEDICATION  *UNUSUAL SHORTNESS OF BREATH  *UNUSUAL BRUISING OR BLEEDING  TENDERNESS IN MOUTH AND THROAT WITH OR WITHOUT PRESENCE OF ULCERS  *URINARY PROBLEMS  *BOWEL PROBLEMS  UNUSUAL RASH Items with * indicate a potential emergency and should be followed up as soon as possible. If you have an emergency after office hours please contact your primary care physician or go to the nearest emergency department.  Please call the clinic during office hours if you have any questions or concerns.   You may also contact the Patient Navigator at [phone] should you have any questions or need assistance in obtaining follow up care.      Resources For Cancer Patients and their Caregivers ? American Cancer Society: Can assist with transportation, wigs, general needs, runs Look Good Feel Better.        1-888-227-6333 ? Cancer Care: Provides financial assistance, online support groups, medication/co-pay assistance.  1-800-813-HOPE (4673) ? [facility] Assists [place]  cancer patients and their families through emotional , educational and financial support.  336-427-4357 ? [place] DSS Where to apply for food stamps, Medicaid and utility assistance. 336-342-1394 ? [facility]: Transportation to medical appointments. 336-347-2287 ? Social Security Administration: May apply for disability if have a Stage IV cancer. 336-342-7796 1-800-772-1213 ? [place] Aging, Disability and Transit Services: Assists with nutrition, care and transit needs. 336-349-2343          

## 2019-09-30 NOTE — Progress Notes (Signed)
Richard Wallace presents today for D2C7 Decitabine infusion. VSS upon arrival. No changes overnight. Port flushed with positive blood return. Decitabine infusion tolerated without incident or complaint. VSS upon completion. Port flushed and left accessed for use tomorrow. Discharged in satisfactory condition with follow up instructions.

## 2019-10-01 ENCOUNTER — Inpatient Hospital Stay (HOSPITAL_COMMUNITY): Payer: Medicare Other

## 2019-10-01 ENCOUNTER — Other Ambulatory Visit: Payer: Self-pay

## 2019-10-01 VITALS — BP 148/85 | HR 73 | Temp 97.0°F | Resp 18

## 2019-10-01 DIAGNOSIS — Z95828 Presence of other vascular implants and grafts: Secondary | ICD-10-CM

## 2019-10-01 DIAGNOSIS — Z5111 Encounter for antineoplastic chemotherapy: Secondary | ICD-10-CM | POA: Diagnosis not present

## 2019-10-01 DIAGNOSIS — C9200 Acute myeloblastic leukemia, not having achieved remission: Secondary | ICD-10-CM

## 2019-10-01 DIAGNOSIS — D46Z Other myelodysplastic syndromes: Secondary | ICD-10-CM

## 2019-10-01 MED ORDER — SODIUM CHLORIDE 0.9 % IV SOLN
15.0000 mg/m2 | Freq: Once | INTRAVENOUS | Status: AC
  Administered 2019-10-01: 35 mg via INTRAVENOUS
  Filled 2019-10-01: qty 7

## 2019-10-01 MED ORDER — SODIUM CHLORIDE 0.9% FLUSH
10.0000 mL | INTRAVENOUS | Status: DC | PRN
  Administered 2019-10-01: 10 mL

## 2019-10-01 MED ORDER — HEPARIN SOD (PORK) LOCK FLUSH 100 UNIT/ML IV SOLN
500.0000 [IU] | Freq: Once | INTRAVENOUS | Status: AC | PRN
  Administered 2019-10-01: 500 [IU]

## 2019-10-01 MED ORDER — SODIUM CHLORIDE 0.9 % IV SOLN: Freq: Once | INTRAVENOUS | Status: AC

## 2019-10-01 MED ORDER — SODIUM CHLORIDE 0.9 % IV SOLN
8.0000 mg | Freq: Once | INTRAVENOUS | Status: AC
  Administered 2019-10-01: 8 mg via INTRAVENOUS
  Filled 2019-10-01: qty 4

## 2019-10-01 NOTE — Patient Instructions (Addendum)
[facility] Discharge Instructions for Patients Receiving Chemotherapy   Beginning January 23rd 2017 lab work for the Cancer Center will be done in the  Main lab at [facility] on 1st floor. If you have a lab appointment with the Cancer Center please come in thru the  Main Entrance and check in at the main information desk   Today you received the following chemotherapy agents Decitabine  To help prevent nausea and vomiting after your treatment, we encourage you to take your nausea medication    If you develop nausea and vomiting, or diarrhea that is not controlled by your medication, call the clinic.  The clinic phone number is [phone]. Office hours are Monday-Friday 8:30am-5:00pm.  BELOW ARE SYMPTOMS THAT SHOULD BE REPORTED IMMEDIATELY:  *FEVER GREATER THAN 101.0 F  *CHILLS WITH OR WITHOUT FEVER  NAUSEA AND VOMITING THAT IS NOT CONTROLLED WITH YOUR NAUSEA MEDICATION  *UNUSUAL SHORTNESS OF BREATH  *UNUSUAL BRUISING OR BLEEDING  TENDERNESS IN MOUTH AND THROAT WITH OR WITHOUT PRESENCE OF ULCERS  *URINARY PROBLEMS  *BOWEL PROBLEMS  UNUSUAL RASH Items with * indicate a potential emergency and should be followed up as soon as possible. If you have an emergency after office hours please contact your primary care physician or go to the nearest emergency department.  Please call the clinic during office hours if you have any questions or concerns.   You may also contact the Patient Navigator at [phone] should you have any questions or need assistance in obtaining follow up care.      Resources For Cancer Patients and their Caregivers ? American Cancer Society: Can assist with transportation, wigs, general needs, runs Look Good Feel Better.        1-888-227-6333 ? Cancer Care: Provides financial assistance, online support groups, medication/co-pay assistance.  1-800-813-HOPE (4673) ? [facility] Assists [place]  cancer patients and their families through emotional , educational and financial support.  336-427-4357 ? [place] DSS Where to apply for food stamps, Medicaid and utility assistance. 336-342-1394 ? [facility]: Transportation to medical appointments. 336-347-2287 ? Social Security Administration: May apply for disability if have a Stage IV cancer. 336-342-7796 1-800-772-1213 ? [place] Aging, Disability and Transit Services: Assists with nutrition, care and transit needs. 336-349-2343          

## 2019-10-01 NOTE — Progress Notes (Signed)
Richard Wallace presents today for D3C7 Decitabine infusion. VSS upon arrival. No changes overnight. Port flushed with positive blood return. Decitabine infusion tolerated without incident or complaint. VSS upon completion. Port flushed and left accessed for use tomorrow. Discharged in satisfactory condition with follow up instructions.

## 2019-10-02 ENCOUNTER — Inpatient Hospital Stay (HOSPITAL_COMMUNITY): Payer: Medicare Other

## 2019-10-02 VITALS — BP 127/81 | HR 62 | Temp 98.6°F | Resp 16

## 2019-10-02 DIAGNOSIS — Z5111 Encounter for antineoplastic chemotherapy: Secondary | ICD-10-CM | POA: Diagnosis not present

## 2019-10-02 DIAGNOSIS — C9200 Acute myeloblastic leukemia, not having achieved remission: Secondary | ICD-10-CM

## 2019-10-02 DIAGNOSIS — Z95828 Presence of other vascular implants and grafts: Secondary | ICD-10-CM

## 2019-10-02 DIAGNOSIS — D46Z Other myelodysplastic syndromes: Secondary | ICD-10-CM

## 2019-10-02 MED ORDER — HEPARIN SOD (PORK) LOCK FLUSH 100 UNIT/ML IV SOLN
500.0000 [IU] | Freq: Once | INTRAVENOUS | Status: AC | PRN
  Administered 2019-10-02: 500 [IU]

## 2019-10-02 MED ORDER — SODIUM CHLORIDE 0.9 % IV SOLN: Freq: Once | INTRAVENOUS | Status: AC

## 2019-10-02 MED ORDER — SODIUM CHLORIDE 0.9% FLUSH
10.0000 mL | INTRAVENOUS | Status: DC | PRN
  Administered 2019-10-02: 10 mL

## 2019-10-02 MED ORDER — SODIUM CHLORIDE 0.9 % IV SOLN
8.0000 mg | Freq: Once | INTRAVENOUS | Status: AC
  Administered 2019-10-02: 8 mg via INTRAVENOUS
  Filled 2019-10-02: qty 4

## 2019-10-02 MED ORDER — SODIUM CHLORIDE 0.9 % IV SOLN
15.0000 mg/m2 | Freq: Once | INTRAVENOUS | Status: AC
  Administered 2019-10-02: 35 mg via INTRAVENOUS
  Filled 2019-10-02: qty 7

## 2019-10-02 NOTE — Progress Notes (Signed)
Treatment given per orders. Patient tolerated it well without problems. Vitals stable and discharged home from clinic ambulatory. Follow up as scheduled.  

## 2019-10-02 NOTE — Patient Instructions (Signed)
[facility] Discharge Instructions for Patients Receiving Chemotherapy  Today you received the following chemotherapy agents   To help prevent nausea and vomiting after your treatment, we encourage you to take your nausea medication   If you develop nausea and vomiting that is not controlled by your nausea medication, call the clinic.   BELOW ARE SYMPTOMS THAT SHOULD BE REPORTED IMMEDIATELY:  *FEVER GREATER THAN 100.5 F  *CHILLS WITH OR WITHOUT FEVER  NAUSEA AND VOMITING THAT IS NOT CONTROLLED WITH YOUR NAUSEA MEDICATION  *UNUSUAL SHORTNESS OF BREATH  *UNUSUAL BRUISING OR BLEEDING  TENDERNESS IN MOUTH AND THROAT WITH OR WITHOUT PRESENCE OF ULCERS  *URINARY PROBLEMS  *BOWEL PROBLEMS  UNUSUAL RASH Items with * indicate a potential emergency and should be followed up as soon as possible.  Feel free to call the clinic should you have any questions or concerns. The clinic phone number is [phone].  Please show the CHEMO ALERT CARD at check-in to the Emergency Department and triage nurse.   

## 2019-10-03 ENCOUNTER — Other Ambulatory Visit: Payer: Self-pay

## 2019-10-03 ENCOUNTER — Encounter (HOSPITAL_COMMUNITY): Payer: Self-pay

## 2019-10-03 ENCOUNTER — Inpatient Hospital Stay (HOSPITAL_COMMUNITY): Payer: Medicare Other

## 2019-10-03 VITALS — BP 138/83 | HR 69 | Temp 97.9°F | Resp 18

## 2019-10-03 DIAGNOSIS — Z95828 Presence of other vascular implants and grafts: Secondary | ICD-10-CM

## 2019-10-03 DIAGNOSIS — Z5111 Encounter for antineoplastic chemotherapy: Secondary | ICD-10-CM | POA: Diagnosis not present

## 2019-10-03 DIAGNOSIS — D46Z Other myelodysplastic syndromes: Secondary | ICD-10-CM

## 2019-10-03 DIAGNOSIS — C9200 Acute myeloblastic leukemia, not having achieved remission: Secondary | ICD-10-CM

## 2019-10-03 MED ORDER — HEPARIN SOD (PORK) LOCK FLUSH 100 UNIT/ML IV SOLN
500.0000 [IU] | Freq: Once | INTRAVENOUS | Status: AC | PRN
  Administered 2019-10-03: 500 [IU]

## 2019-10-03 MED ORDER — SODIUM CHLORIDE 0.9 % IV SOLN
8.0000 mg | Freq: Once | INTRAVENOUS | Status: AC
  Administered 2019-10-03: 8 mg via INTRAVENOUS
  Filled 2019-10-03: qty 4

## 2019-10-03 MED ORDER — SODIUM CHLORIDE 0.9 % IV SOLN
15.0000 mg/m2 | Freq: Once | INTRAVENOUS | Status: AC
  Administered 2019-10-03: 35 mg via INTRAVENOUS
  Filled 2019-10-03: qty 7

## 2019-10-03 MED ORDER — SODIUM CHLORIDE 0.9% FLUSH
10.0000 mL | INTRAVENOUS | Status: DC | PRN
  Administered 2019-10-03: 10 mL

## 2019-10-03 MED ORDER — SODIUM CHLORIDE 0.9 % IV SOLN: Freq: Once | INTRAVENOUS | Status: AC

## 2019-10-03 NOTE — Progress Notes (Signed)
Patient tolerated chemotherapy with no complaints voiced.  Side effects with management reviewed with understanding verbalized.  Port site clean and dry with no bruising or swelling noted at site.  Good blood return noted before and after administration of chemotherapy.  Band aid applied.  Patient left ambulatory with VSS and no s/s of distress noted.  

## 2019-10-13 ENCOUNTER — Inpatient Hospital Stay (HOSPITAL_COMMUNITY): Payer: Medicare Other

## 2019-10-13 ENCOUNTER — Other Ambulatory Visit: Payer: Self-pay

## 2019-10-13 ENCOUNTER — Inpatient Hospital Stay (HOSPITAL_COMMUNITY): Payer: Medicare Other | Attending: Hematology | Admitting: Hematology

## 2019-10-13 VITALS — BP 127/65 | HR 91 | Temp 97.5°F | Resp 18 | Wt 226.9 lb

## 2019-10-13 DIAGNOSIS — C9200 Acute myeloblastic leukemia, not having achieved remission: Secondary | ICD-10-CM

## 2019-10-13 DIAGNOSIS — N183 Chronic kidney disease, stage 3 unspecified: Secondary | ICD-10-CM | POA: Insufficient documentation

## 2019-10-13 DIAGNOSIS — I129 Hypertensive chronic kidney disease with stage 1 through stage 4 chronic kidney disease, or unspecified chronic kidney disease: Secondary | ICD-10-CM | POA: Insufficient documentation

## 2019-10-13 DIAGNOSIS — Z79899 Other long term (current) drug therapy: Secondary | ICD-10-CM | POA: Insufficient documentation

## 2019-10-13 LAB — COMPREHENSIVE METABOLIC PANEL
ALT: 31 U/L (ref 0–44)
AST: 22 U/L (ref 15–41)
Albumin: 4 g/dL (ref 3.5–5.0)
Alkaline Phosphatase: 63 U/L (ref 38–126)
Anion gap: 8 (ref 5–15)
BUN: 25 mg/dL — ABNORMAL HIGH (ref 8–23)
CO2: 26 mmol/L (ref 22–32)
Calcium: 9.6 mg/dL (ref 8.9–10.3)
Chloride: 107 mmol/L (ref 98–111)
Creatinine, Ser: 1.13 mg/dL (ref 0.61–1.24)
GFR calc Af Amer: >60 mL/min (ref >60)
GFR calc non Af Amer: >60 mL/min (ref >60)
Glucose, Bld: 118 mg/dL — ABNORMAL HIGH (ref 70–99)
Potassium: 4.6 mmol/L (ref 3.5–5.1)
Sodium: 141 mmol/L (ref 135–145)
Total Bilirubin: 0.5 mg/dL (ref 0.3–1.2)
Total Protein: 6.7 g/dL (ref 6.5–8.1)

## 2019-10-13 LAB — CBC WITH DIFFERENTIAL/PLATELET
Abs Immature Granulocytes: 0.02 10*3/uL (ref 0.00–0.07)
Basophils Absolute: 0 10*3/uL (ref 0.0–0.1)
Basophils Relative: 1 %
Eosinophils Absolute: 0 10*3/uL (ref 0.0–0.5)
Eosinophils Relative: 1 %
HCT: 43.4 % (ref 39.0–52.0)
Hemoglobin: 14.3 g/dL (ref 13.0–17.0)
Immature Granulocytes: 1 %
Lymphocytes Relative: 35 %
Lymphs Abs: 0.7 10*3/uL (ref 0.7–4.0)
MCH: 34.4 pg — ABNORMAL HIGH (ref 26.0–34.0)
MCHC: 32.9 g/dL (ref 30.0–36.0)
MCV: 104.3 fL — ABNORMAL HIGH (ref 80.0–100.0)
Monocytes Absolute: 0.2 10*3/uL (ref 0.1–1.0)
Monocytes Relative: 11 %
Neutro Abs: 1.1 10*3/uL — ABNORMAL LOW (ref 1.7–7.7)
Neutrophils Relative %: 51 %
Platelets: 109 10*3/uL — ABNORMAL LOW (ref 150–400)
RBC: 4.16 MIL/uL — ABNORMAL LOW (ref 4.22–5.81)
RDW: 15.1 % (ref 11.5–15.5)
WBC: 2.1 10*3/uL — ABNORMAL LOW (ref 4.0–10.5)
nRBC: 0 % (ref 0.0–0.2)

## 2019-10-13 LAB — MAGNESIUM: Magnesium: 2 mg/dL (ref 1.7–2.4)

## 2019-10-13 LAB — LACTATE DEHYDROGENASE: LDH: 153 U/L (ref 98–192)

## 2019-10-13 NOTE — Progress Notes (Signed)
[facility] [address], [place] [address]   CLINIC:  Medical Oncology/Hematology  PCP:  [name], [place] [address]  [phone]  REASON FOR VISIT:  Follow-up for AML  CURRENT THERAPY: Ventoclax  INTERVAL HISTORY:  [name], a 74 y.o. 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 Sparrow was last seen on 09/29/2019.  He reports that his pain is controlled with tramadol which he takes 4 times a day. He has been taking his ventoclax nightly.   REVIEW OF SYSTEMS:  Review of Systems  Constitutional: Positive for fatigue (severe). Negative for appetite change.  All other systems reviewed and are negative.   PAST MEDICAL/SURGICAL HISTORY:  Past Medical History:  Diagnosis Date  . Arthritis   . 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 Cancer ([facility]) 01-28-2014   skin cancer  . CKD (chronic kidney disease), stage III   . GERD (gastroesophageal reflux disease)   . Heart murmur    NOTED DURING PHYSICAL WHEN HE WAS ENLISTING IN [facility] , DIDNT KNOW UNTIL THAT TIME AND REPORTS , "THATS THE LAST I HEARD ABOUT IT "   . History of hypertension    no longer issue  . History of kidney stones   . History of malignant melanoma of skin    excision top of scalp 2015-- no recurrence  . History of urinary retention    post op lumbar fusion surgery 04/ 2016  . Hypertension   . 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 Left ureteral calculus   . Seasonal allergies   . Wears glasses   . Wears glasses   . Wears partial dentures    upper and lower   Past Surgical History:  Procedure Laterality Date  . ANKLE FUSION Right 2007  . CARPAL TUNNEL RELEASE Left 12/28/2009   w/ pulley release left long finger  . CARPAL TUNNEL RELEASE Right 07/22/2013   Procedure: RIGHT CARPAL TUNNEL RELEASE;  Surgeon: Cammie Sickle., MD;  Location: [facility];  Service: Orthopedics;  Laterality: Right;  . COLONOSCOPY    . CYSTO/  LEFT RETROGRADE PYELOGRAM  11/21/2010  . CYSTOSCOPY WITH STENT PLACEMENT Left 03/09/2016   Procedure: CYSTOSCOPY WITH STENT PLACEMENT;  Surgeon: Franchot Gallo, MD;  Location: [facility];  Service: Urology;  Laterality: Left;  . CYSTOSCOPY/RETROGRADE/URETEROSCOPY/STONE EXTRACTION WITH BASKET Left 03/09/2016   Procedure: CYSTOSCOPY/RETROGRADE/URETEROSCOPY/STONE EXTRACTION WITH BASKET;  Surgeon: Franchot Gallo, MD;  Location: [facility];  Service: Urology;  Laterality: Left;  . LEFT URETEROSCOPIC LASER LITHOTRIPSY STONE EXTRACTION/ STENT PLACEMENT  05/23/2010  . MOHS SURGERY     TOP OF THE HEAD   . ORIF ANKLE FRACTURE Right 1978  . PORT-A-CATH REMOVAL Right 02/14/2019   Procedure: MINOR REMOVAL PORT-A-CATH;  Surgeon: Aviva Signs, MD;  Location: [facility];  Service: General;  Laterality: Right;  . PORTACATH PLACEMENT Right 08/19/2018   Procedure: INSERTION PORT-A-CATH (attached catheter in right subclavian);  Surgeon: Aviva Signs, MD;  Location: [facility];  Service: General;  Laterality: Right;  . PORTACATH PLACEMENT Left 05/16/2019   Procedure: INSERTION PORT-A-CATH (attached catheter in left subclavian);  Surgeon: Aviva Signs, MD;  Location: [facility];  Service: General;  Laterality: Left;  . POSTERIOR LUMBAR FUSION  08/21/2014   laminectomy and decompression L2 -- L5  . RIGHT LOWER LEG SURGERY  X3  1975 to 1976   including ORIF  . TONSILLECTOMY AND ADENOIDECTOMY  1986  . UMBILICAL HERNIA REPAIR  2009 approx    SOCIAL HISTORY:  Social History   Socioeconomic History  . Marital status: Married    Spouse name: Not on file  . Number of children: Not on file  . Years of education: Not on file  . Highest education level: Not on file  Occupational History  . Not on file  Tobacco Use  . Smoking status: Former Smoker    Years: 20.00    Types: Cigarettes    Quit date: 07/17/1986     Years since quitting: 33.2  . Smokeless tobacco: Never Used  Substance and Sexual Activity  . Alcohol use: Yes    Alcohol/week: 7.0 - 14.0 standard drinks    Types: 7 - 14 Cans of beer per week    Comment: 1 -2 beer daily  . Drug use: No  . Sexual activity: Not Currently  Other Topics Concern  . Not on file  Social History Narrative  . Not on file   Social Determinants of Health   Financial Resource Strain: Unknown  . Difficulty of Paying Living Expenses: Patient refused  Food Insecurity: Unknown  . Worried About Charity fundraiser in the Last Year: Patient refused  . Ran Out of Food in the Last Year: Patient refused  Transportation Needs: Unknown  . Lack of Transportation (Medical): Patient refused  . Lack of Transportation (Non-Medical): Patient refused  Physical Activity: Unknown  . Days of Exercise per Week: Patient refused  . Minutes of Exercise per Session: Patient refused  Stress: Unknown  . Feeling of Stress : Patient refused  Social Connections: Unknown  . Frequency of Communication with Friends and Family: Patient refused  . Frequency of Social Gatherings with Friends and Family: Patient refused  . Attends Religious Services: Patient refused  . Active Member of Clubs or Organizations: Patient refused  . Attends Archivist Meetings: Patient refused  . Marital Status: Patient refused  Intimate Partner Violence: Unknown  . Fear of Current or Ex-Partner: Patient refused  . Emotionally Abused: Patient refused  . Physically Abused: Patient refused  . Sexually Abused: Patient refused    FAMILY HISTORY:  Family History  Problem Relation Age of Onset  . Stroke Mother   . Prostate cancer Father   . Bone cancer Father   . Diverticulitis Father   . Rheum arthritis Sister   . Urinary tract infection Sister   . Colon cancer Neg Hx     CURRENT MEDICATIONS:  Current Outpatient Medications  Medication Sig Dispense Refill  . allopurinol (ZYLOPRIM) 300 MG  tablet Take 300 mg by mouth at bedtime.     Marland Kitchen amLODipine (NORVASC) 2.5 MG tablet Take 1 tablet (2.5 mg total) by mouth daily. 90 tablet 2  . cetirizine (ZYRTEC) 10 MG tablet Take 10 mg by mouth daily. IN THE MORNING    . Cholecalciferol (VITAMIN D3) 1.25 MG (50000 UT) CAPS Take 1 capsule by mouth daily.    Marland Kitchen docusate sodium (COLACE) 100 MG capsule Take 100 mg by mouth at bedtime.     . lansoprazole (PREVACID) 15 MG capsule Take 15 mg by mouth daily.     . magnesium oxide (MAG-OX) 400 MG tablet Take 1 tablet (400 mg total) by mouth 3 (three) times daily. 90 tablet 2  . tamsulosin (FLOMAX) 0.4 MG CAPS capsule Take 0.4 mg by mouth every evening.     Marland Kitchen  venetoclax 100 MG TABS Take 200 mg by mouth daily. 60 tablet 0  . acetaminophen (TYLENOL) 500 MG tablet Take 1,000 mg by mouth 3 (three) times daily.     . diclofenac sodium (VOLTAREN) 1 % GEL Apply 1 g topically 3 (three) times daily as needed (knee pain.).     Marland Kitchen lidocaine-prilocaine (EMLA) cream Apply 1 application topically See admin instructions. ONE HOUR PRIOR TO CHEMOTHERAPY APPOINTMENT    . traMADol (ULTRAM) 50 MG tablet Take 1 tablet (50 mg total) by mouth every 6 (six) hours as needed. for pain (Patient not taking: Reported on 10/13/2019) 120 tablet 0   No current facility-administered medications for this visit.   Facility-Administered Medications Ordered in Other Visits  Medication Dose Route Frequency Provider Last Rate Last Admin  . sodium chloride flush (NS) 0.9 % injection 20 mL  20 mL Intravenous PRN [name], MD   20 mL at 07/21/19 1053    ALLERGIES:  No Known Allergies  PHYSICAL EXAM:  Performance status (ECOG): 1 - Symptomatic but completely ambulatory  Vitals:   10/13/19 1119  BP: 127/65  Pulse: 91  Resp: 18  Temp: (!) 97.5 F (36.4 C)  SpO2: 96%   Wt Readings from Last 3 Encounters:  10/13/19 226 lb 14.4 oz (102.9 kg)  09/29/19 227 lb 14.4 oz (103.4 kg)  09/24/19 228 lb 8 oz (103.6 kg)   Physical  Exam Vitals reviewed.  Constitutional:      Appearance: Normal appearance. He is obese.  Cardiovascular:     Rate and Rhythm: Normal rate and regular rhythm.     Pulses: Normal pulses.     Heart sounds: Normal heart sounds.  Pulmonary:     Effort: Pulmonary effort is normal.     Breath sounds: Normal breath sounds.  Abdominal:     Palpations: Abdomen is soft.     Tenderness: There is no abdominal tenderness.  Musculoskeletal:     Right lower leg: Edema (+2) present.     Left lower leg: Edema (+1) present.  Skin:    Findings: No rash.  Neurological:     General: No focal deficit present.     Mental Status: He is alert and oriented to person, place, and time.  Psychiatric:        Mood and Affect: Mood normal.        Behavior: Behavior normal.     LABORATORY DATA:  I have reviewed the labs as listed.  CBC Latest Ref Rng & Units 10/13/2019 09/29/2019 09/24/2019  WBC 4.0 - 10.5 K/uL 2.1(L) 3.9(L) 2.8(L)  Hemoglobin 13.0 - 17.0 g/dL 14.3 14.6 14.8  Hematocrit 39.0 - 52.0 % 43.4 46.2 45.3  Platelets 150 - 400 K/uL 109(L) 139(L) 136(L)   CMP Latest Ref Rng & Units 10/13/2019 09/29/2019 09/24/2019  Glucose 70 - 99 mg/dL 118(H) 99 115(H)  BUN 8 - 23 mg/dL 25(H) 20 20  Creatinine 0.61 - 1.24 mg/dL 1.13 1.03 1.07  Sodium 135 - 145 mmol/L 141 140 140  Potassium 3.5 - 5.1 mmol/L 4.6 4.4 4.2  Chloride 98 - 111 mmol/L 107 105 105  CO2 22 - 32 mmol/L 26 26 24   Calcium 8.9 - 10.3 mg/dL 9.6 9.6 9.4  Total Protein 6.5 - 8.1 g/dL 6.7 6.4(L) 6.2(L)  Total Bilirubin 0.3 - 1.2 mg/dL 0.5 0.6 0.5  Alkaline Phos 38 - 126 U/L 63 59 67  AST 15 - 41 U/L 22 21 20   ALT 0 - 44 U/L 31 26 30  Component Value Date/Time   RBC 4.16 (L) 10/13/2019 1043   MCV 104.3 (H) 10/13/2019 1043   MCH 34.4 (H) 10/13/2019 1043   MCHC 32.9 10/13/2019 1043   RDW 15.1 10/13/2019 1043   LYMPHSABS 0.7 10/13/2019 1043   MONOABS 0.2 10/13/2019 1043   EOSABS 0.0 10/13/2019 1043   BASOSABS 0.0 10/13/2019 1043     DIAGNOSTIC IMAGING:  I have independently reviewed the scans and discussed with the patient. No results found.   ASSESSMENT:  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 -BMBX on 02/25/2019 after cycle 1 did not show any evidence of leukemia. -CT CAP on 02/12/2019 shows numerous bilateral irregular/spiculated pulmonary nodules measuring up to 14 mm, nonspecific.  Hepatic steatosis.  Spleen is normal.   PLAN:  1.  AML: -I reviewed labs today.  LFTs are normal.  White count is 2.1 with ANC of 1.1. -He was told to hold venetoclax after tonight's dose. -He will be reevaluated in 4 weeks prior to next cycle.  2.  Arthralgias: -Continue tramadol every 6 hours.  3.  Hypomagnesemia: -Continue magnesium 3 times daily.  4.  Hypertension: -Continue Norvasc 2.5 mg daily.  Lisinopril on hold.  5.  CKD: -Baseline 1.1-1.5.  This has improved since we held lisinopril.  Creatinine today is 1.13.  Orders placed this encounter:  No orders of the defined types were placed in this encounter.    [name], MD [address] [phone]   I, Milinda Antis, am acting as a scribe for Dr. Sanda Linger.  I, [name] MD, have reviewed the above documentation for accuracy and completeness, and I agree with the above.

## 2019-10-13 NOTE — Patient Instructions (Addendum)
[place] at [facility] Discharge Instructions  You were seen today by Dr. Delton Coombes. He went over your recent results. Please take your last dose of ventoclax tonight and then stop taking them. Dr. Delton Coombes will see you back in 4 weeks for labs and follow up.   Thank you for choosing [place] at [facility] to provide your oncology and hematology care.  To afford each patient quality time with our provider, please arrive at least 15 minutes before your scheduled appointment time.   If you have a lab appointment with the [facility] please come in thru the Main Entrance and check in at the main information desk  You need to re-schedule your appointment should you arrive 10 or more minutes late.  We strive to give you quality time with our providers, and arriving late affects you and other patients whose appointments are after yours.  Also, if you no show three or more times for appointments you may be dismissed from the clinic at the providers discretion.     Again, thank you for choosing [facility].  Our hope is that these requests will decrease the amount of time that you wait before being seen by our physicians.       _____________________________________________________________  Should you have questions after your visit to [facility], please contact our office at [phone] between the hours of 8:00 a.m. and 4:30 p.m.  Voicemails left after 4:00 p.m. will not be returned until the following business day.  For prescription refill requests, have your pharmacy contact our office and allow 72 hours.    Cancer Center Support Programs:   > Cancer Support Group  2nd Tuesday of the month 1pm-2pm, Journey Room

## 2019-10-27 ENCOUNTER — Ambulatory Visit (HOSPITAL_COMMUNITY): Payer: Medicare Other | Admitting: Oncology

## 2019-10-27 ENCOUNTER — Ambulatory Visit (HOSPITAL_COMMUNITY): Payer: Medicare Other

## 2019-10-27 ENCOUNTER — Other Ambulatory Visit (HOSPITAL_COMMUNITY): Payer: Medicare Other

## 2019-10-28 ENCOUNTER — Ambulatory Visit (HOSPITAL_COMMUNITY): Payer: Medicare Other

## 2019-10-29 ENCOUNTER — Ambulatory Visit (HOSPITAL_COMMUNITY): Payer: Medicare Other

## 2019-10-30 ENCOUNTER — Ambulatory Visit (HOSPITAL_COMMUNITY): Payer: Medicare Other

## 2019-10-31 ENCOUNTER — Ambulatory Visit (HOSPITAL_COMMUNITY): Payer: Medicare Other

## 2019-11-17 ENCOUNTER — Inpatient Hospital Stay (HOSPITAL_BASED_OUTPATIENT_CLINIC_OR_DEPARTMENT_OTHER): Payer: Medicare Other | Admitting: Hematology

## 2019-11-17 ENCOUNTER — Inpatient Hospital Stay (HOSPITAL_COMMUNITY): Payer: Medicare Other | Attending: Hematology

## 2019-11-17 ENCOUNTER — Other Ambulatory Visit (HOSPITAL_COMMUNITY): Payer: Self-pay | Admitting: Hematology

## 2019-11-17 ENCOUNTER — Other Ambulatory Visit: Payer: Self-pay

## 2019-11-17 ENCOUNTER — Inpatient Hospital Stay (HOSPITAL_COMMUNITY): Payer: Medicare Other

## 2019-11-17 VITALS — BP 137/69 | HR 64 | Temp 97.5°F | Resp 18 | Wt 227.3 lb

## 2019-11-17 VITALS — BP 138/80 | HR 66 | Temp 96.9°F | Resp 18

## 2019-11-17 DIAGNOSIS — K760 Fatty (change of) liver, not elsewhere classified: Secondary | ICD-10-CM | POA: Insufficient documentation

## 2019-11-17 DIAGNOSIS — Z87891 Personal history of nicotine dependence: Secondary | ICD-10-CM | POA: Insufficient documentation

## 2019-11-17 DIAGNOSIS — Z791 Long term (current) use of non-steroidal anti-inflammatories (NSAID): Secondary | ICD-10-CM | POA: Diagnosis not present

## 2019-11-17 DIAGNOSIS — Z5111 Encounter for antineoplastic chemotherapy: Secondary | ICD-10-CM | POA: Diagnosis not present

## 2019-11-17 DIAGNOSIS — M25559 Pain in unspecified hip: Secondary | ICD-10-CM

## 2019-11-17 DIAGNOSIS — C9200 Acute myeloblastic leukemia, not having achieved remission: Secondary | ICD-10-CM

## 2019-11-17 DIAGNOSIS — Z95828 Presence of other vascular implants and grafts: Secondary | ICD-10-CM

## 2019-11-17 DIAGNOSIS — D46Z Other myelodysplastic syndromes: Secondary | ICD-10-CM

## 2019-11-17 DIAGNOSIS — Z85820 Personal history of malignant melanoma of skin: Secondary | ICD-10-CM | POA: Insufficient documentation

## 2019-11-17 DIAGNOSIS — K219 Gastro-esophageal reflux disease without esophagitis: Secondary | ICD-10-CM | POA: Insufficient documentation

## 2019-11-17 DIAGNOSIS — M1990 Unspecified osteoarthritis, unspecified site: Secondary | ICD-10-CM | POA: Diagnosis not present

## 2019-11-17 DIAGNOSIS — I129 Hypertensive chronic kidney disease with stage 1 through stage 4 chronic kidney disease, or unspecified chronic kidney disease: Secondary | ICD-10-CM | POA: Insufficient documentation

## 2019-11-17 DIAGNOSIS — Z79899 Other long term (current) drug therapy: Secondary | ICD-10-CM | POA: Insufficient documentation

## 2019-11-17 DIAGNOSIS — N183 Chronic kidney disease, stage 3 unspecified: Secondary | ICD-10-CM | POA: Insufficient documentation

## 2019-11-17 LAB — CBC WITH DIFFERENTIAL/PLATELET
Abs Immature Granulocytes: 0.03 10*3/uL (ref 0.00–0.07)
Basophils Absolute: 0 10*3/uL (ref 0.0–0.1)
Basophils Relative: 1 %
Eosinophils Absolute: 0 10*3/uL (ref 0.0–0.5)
Eosinophils Relative: 1 %
HCT: 44.1 % (ref 39.0–52.0)
Hemoglobin: 14.5 g/dL (ref 13.0–17.0)
Immature Granulocytes: 1 %
Lymphocytes Relative: 26 %
Lymphs Abs: 1.2 10*3/uL (ref 0.7–4.0)
MCH: 33.6 pg (ref 26.0–34.0)
MCHC: 32.9 g/dL (ref 30.0–36.0)
MCV: 102.3 fL — ABNORMAL HIGH (ref 80.0–100.0)
Monocytes Absolute: 0.6 10*3/uL (ref 0.1–1.0)
Monocytes Relative: 12 %
Neutro Abs: 2.7 10*3/uL (ref 1.7–7.7)
Neutrophils Relative %: 59 %
Platelets: 186 10*3/uL (ref 150–400)
RBC: 4.31 MIL/uL (ref 4.22–5.81)
RDW: 15.1 % (ref 11.5–15.5)
WBC: 4.5 10*3/uL (ref 4.0–10.5)
nRBC: 0 % (ref 0.0–0.2)

## 2019-11-17 LAB — COMPREHENSIVE METABOLIC PANEL
ALT: 25 U/L (ref 0–44)
AST: 21 U/L (ref 15–41)
Albumin: 3.9 g/dL (ref 3.5–5.0)
Alkaline Phosphatase: 65 U/L (ref 38–126)
Anion gap: 11 (ref 5–15)
BUN: 23 mg/dL (ref 8–23)
CO2: 21 mmol/L — ABNORMAL LOW (ref 22–32)
Calcium: 9.4 mg/dL (ref 8.9–10.3)
Chloride: 107 mmol/L (ref 98–111)
Creatinine, Ser: 1 mg/dL (ref 0.61–1.24)
GFR calc Af Amer: >60 mL/min (ref >60)
GFR calc non Af Amer: >60 mL/min (ref >60)
Glucose, Bld: 118 mg/dL — ABNORMAL HIGH (ref 70–99)
Potassium: 4.5 mmol/L (ref 3.5–5.1)
Sodium: 139 mmol/L (ref 135–145)
Total Bilirubin: 0.7 mg/dL (ref 0.3–1.2)
Total Protein: 6.4 g/dL — ABNORMAL LOW (ref 6.5–8.1)

## 2019-11-17 LAB — LACTATE DEHYDROGENASE: LDH: 140 U/L (ref 98–192)

## 2019-11-17 LAB — MAGNESIUM: Magnesium: 2.1 mg/dL (ref 1.7–2.4)

## 2019-11-17 MED ORDER — SODIUM CHLORIDE 0.9 % IV SOLN
8.0000 mg | Freq: Once | INTRAVENOUS | Status: AC
  Administered 2019-11-17: 8 mg via INTRAVENOUS
  Filled 2019-11-17: qty 4

## 2019-11-17 MED ORDER — HEPARIN SOD (PORK) LOCK FLUSH 100 UNIT/ML IV SOLN
500.0000 [IU] | Freq: Once | INTRAVENOUS | Status: AC | PRN
  Administered 2019-11-17: 500 [IU]

## 2019-11-17 MED ORDER — SODIUM CHLORIDE 0.9% FLUSH: 10.0000 mL | INTRAVENOUS | Status: DC | PRN

## 2019-11-17 MED ORDER — SODIUM CHLORIDE 0.9 % IV SOLN
15.0000 mg/m2 | Freq: Once | INTRAVENOUS | Status: AC
  Administered 2019-11-17: 35 mg via INTRAVENOUS
  Filled 2019-11-17: qty 7

## 2019-11-17 MED ORDER — SODIUM CHLORIDE 0.9 % IV SOLN: Freq: Once | INTRAVENOUS | Status: AC

## 2019-11-17 NOTE — Progress Notes (Signed)
Labs reviewed with MD and ok for treatment today per MD.   Treatment given per orders. Patient tolerated it well without problems. Vitals stable and discharged home from clinic ambulatory. Follow up as scheduled.

## 2019-11-17 NOTE — Progress Notes (Signed)
[place], [place] [id number]   CLINIC:  Medical Oncology/Hematology  PCP:  [name], MD [address] 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[place])   08/22/2018 - 12/31/2018 Chemotherapy   The patient had palonosetron (ALOXI) injection 0.25 mg, 0.25 mg, Intravenous,  Once, 4 of 6 cycles Administration: 0.25 mg (08/22/2018), 0.25 mg (08/26/2018), 0.25 mg (08/28/2018), 0.25 mg (08/30/2018), 0.25 mg (10/01/2018), 0.25 mg (10/02/2018), 0.25 mg (11/04/2018), 0.25 mg (09/23/2018), 0.25 mg (09/25/2018), 0.25 mg (09/27/2018), 0.25 mg (10/28/2018), 0.25 mg (10/30/2018), 0.25 mg (11/01/2018), 0.25 mg (12/02/2018), 0.25 mg (12/04/2018), 0.25 mg (12/06/2018) azaCITIDine (VIDAZA) 100 mg in sodium chloride 0.9 % 50 mL chemo infusion, 110 mg (66.7 % of original dose 75 mg/m2), Intravenous, Once, 4 of 6 cycles Dose modification: 50 mg/m2 (original dose 75 mg/m2, Cycle 1, Reason: Provider Judgment), 50 mg/m2 (original dose 75 mg/m2, Cycle 2, Reason: Provider Judgment) Administration: 100 mg (08/22/2018), 100 mg (08/23/2018), 100 mg (08/26/2018), 100 mg (08/27/2018), 100 mg (08/28/2018), 100 mg (08/29/2018), 100 mg (08/30/2018), 100 mg (10/01/2018), 100 mg (10/02/2018), 100 mg (11/04/2018), 100 mg (11/05/2018), 100 mg (09/23/2018), 100 mg (09/24/2018), 100 mg (09/25/2018), 100 mg (09/26/2018), 100 mg (09/27/2018), 100 mg (10/28/2018), 100 mg (10/29/2018), 100 mg (10/30/2018), 100 mg (10/31/2018), 100 mg (11/01/2018), 100 mg (12/02/2018), 100 mg (12/03/2018), 100 mg (12/04/2018), 100 mg (12/05/2018), 100 mg (12/06/2018)  for chemotherapy treatment.    01/20/2019 -  Chemotherapy   The patient had decitabine (DACOGEN) 45 mg in sodium chloride 0.9 % 50 mL chemo  infusion, 20 mg/m2 = 45 mg, Intravenous,  Once, 7 of 10 cycles Dose modification: 15 mg/m2 (original dose 20 mg/m2, Cycle 2, Reason: Other (see comments), Comment: neutropenic fever) Administration: 45 mg (01/20/2019), 45 mg (01/21/2019), 45 mg (01/22/2019), 45 mg (01/23/2019), 45 mg (01/24/2019), 35 mg (03/17/2019), 35 mg (03/18/2019), 35 mg (03/19/2019), 35 mg (03/20/2019), 35 mg (03/21/2019), 35 mg (04/22/2019), 35 mg (04/23/2019), 35 mg (04/24/2019), 35 mg (04/25/2019), 35 mg (05/26/2019), 35 mg (05/27/2019), 35 mg (05/28/2019), 35 mg (05/29/2019), 35 mg (05/30/2019), 35 mg (07/07/2019), 35 mg (07/08/2019), 35 mg (07/09/2019), 35 mg (07/10/2019), 35 mg (07/11/2019), 35 mg (08/18/2019), 35 mg (08/19/2019), 35 mg (08/20/2019), 35 mg (08/21/2019), 35 mg (08/22/2019), 35 mg (09/29/2019), 35 mg (09/30/2019), 35 mg (10/01/2019), 35 mg (10/02/2019), 35 mg (10/03/2019) ondansetron (ZOFRAN) 8 mg in sodium chloride 0.9 % 50 mL IVPB, 8 mg (100 % of original dose 8 mg), Intravenous,  Once, 7 of 10 cycles Dose modification: 8 mg (original dose 8 mg, Cycle 1) Administration: 8 mg (01/20/2019), 8 mg (01/21/2019), 8 mg (01/22/2019), 8 mg (01/23/2019), 8 mg (01/24/2019), 8 mg (03/17/2019), 8 mg (03/18/2019), 8 mg (03/19/2019), 8 mg (03/20/2019), 8 mg (03/21/2019), 8 mg (04/22/2019), 8 mg (04/23/2019), 8 mg (04/24/2019), 8 mg (04/25/2019), 8 mg (05/26/2019), 8 mg (05/27/2019), 8 mg (05/28/2019), 8 mg (05/29/2019), 8 mg (05/30/2019), 8 mg (07/07/2019), 8 mg (07/08/2019), 8 mg (07/09/2019), 8 mg (07/10/2019), 8 mg (07/11/2019), 8 mg (08/18/2019), 8 mg (08/19/2019), 8 mg (08/20/2019), 8 mg (08/21/2019), 8 mg (08/22/2019), 8 mg (09/29/2019), 8 mg (09/30/2019), 8 mg (10/01/2019), 8 mg (10/02/2019), 8 mg (10/03/2019)  for chemotherapy treatment.    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[place])   01/20/2019 -  Chemotherapy   The patient had decitabine (DACOGEN) 45 mg in sodium chloride 0.9 % 50 mL chemo infusion, 20 mg/m2  = 45 mg, Intravenous,  Once, 7 of 10 cycles Dose modification: 15 mg/m2 (original dose 20 mg/m2, Cycle 2, Reason: Other (see comments), Comment: neutropenic fever) Administration: 45 mg (01/20/2019), 45 mg (01/21/2019), 45 mg (01/22/2019), 45 mg (01/23/2019), 45 mg (01/24/2019), 35 mg (03/17/2019), 35 mg (03/18/2019), 35 mg (03/19/2019), 35 mg (03/20/2019), 35 mg (03/21/2019), 35 mg (04/22/2019), 35 mg (04/23/2019), 35 mg (04/24/2019), 35 mg (04/25/2019), 35 mg (05/26/2019), 35 mg (05/27/2019), 35 mg (05/28/2019), 35 mg (05/29/2019), 35 mg (05/30/2019), 35 mg (07/07/2019), 35 mg (07/08/2019), 35 mg (07/09/2019), 35 mg (07/10/2019), 35 mg (07/11/2019), 35 mg (08/18/2019), 35 mg (08/19/2019), 35 mg (08/20/2019), 35 mg (08/21/2019), 35 mg (08/22/2019), 35 mg (09/29/2019), 35 mg (09/30/2019), 35 mg (10/01/2019), 35 mg (10/02/2019), 35 mg (10/03/2019) ondansetron (ZOFRAN) 8 mg in sodium chloride 0.9 % 50 mL IVPB, 8 mg (100 % of original dose 8 mg), Intravenous,  Once, 7 of 10 cycles Dose modification: 8 mg (original dose 8 mg, Cycle 1) Administration: 8 mg (01/20/2019), 8 mg (01/21/2019), 8 mg (01/22/2019), 8 mg (01/23/2019), 8 mg (01/24/2019), 8 mg (03/17/2019), 8 mg (03/18/2019), 8 mg (03/19/2019), 8 mg (03/20/2019), 8 mg (03/21/2019), 8 mg (04/22/2019), 8 mg (04/23/2019), 8 mg (04/24/2019), 8 mg (04/25/2019), 8 mg (05/26/2019), 8 mg (05/27/2019), 8 mg (05/28/2019), 8 mg (05/29/2019), 8 mg (05/30/2019), 8 mg (07/07/2019), 8 mg (07/08/2019), 8 mg (07/09/2019), 8 mg (07/10/2019), 8 mg (07/11/2019), 8 mg (08/18/2019), 8 mg (08/19/2019), 8 mg (08/20/2019), 8 mg (08/21/2019), 8 mg (08/22/2019), 8 mg (09/29/2019), 8 mg (09/30/2019), 8 mg (10/01/2019), 8 mg (10/02/2019), 8 mg (10/03/2019)  for chemotherapy treatment.      CANCER STAGING: Cancer Staging No matching staging information was found for the patient.  INTERVAL HISTORY:  [name], a 74 y.o. male, returns for routine follow-up and consideration for next cycle of chemotherapy. Richard Wallace was last seen on  09/29/2019.  Due for cycle #8 of decitabine today.   Today he is accompanied by his wife. Overall, he tells me he has been feeling pretty well. He denies any F/C or any trips to the ED or urgent care. He reports feeling sleepy most of the day and cannot determine if it is due to the chemo or COVID vaccine; he reports that it came on gradually 3 months ago.  He received his second Moderna vaccine in 07/2019. He reports feeling stressed and angry due to the prognosis, living situation and family matters.  Overall, he feels ready for next cycle of chemo today.    REVIEW OF SYSTEMS:  Review of Systems  Constitutional: Positive for fatigue (severe, sleepiness). Negative for appetite change.  Cardiovascular: Positive for leg swelling (stable).  All other systems reviewed and are negative.   PAST MEDICAL/SURGICAL HISTORY:  Past Medical History:  Diagnosis Date  . Arthritis   . 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 Cancer ([place]) 01-28-2014   skin cancer  . CKD (chronic kidney disease), stage III   . GERD (gastroesophageal reflux disease)   . Heart murmur    NOTED DURING PHYSICAL WHEN HE WAS ENLISTING IN [facility] , DIDNT KNOW UNTIL THAT TIME AND REPORTS , "THATS THE LAST I HEARD ABOUT IT "   . History of hypertension    no longer issue  . History of kidney stones   .  History of malignant melanoma of skin    excision top of scalp 2015-- no recurrence  . History of urinary retention    post op lumbar fusion surgery 04/ 2016  . Hypertension   . 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 Left ureteral calculus   . Seasonal allergies   . Wears glasses   . Wears glasses   . Wears partial dentures    upper and lower   Past Surgical History:  Procedure Laterality Date  . ANKLE FUSION Right 2007  . CARPAL TUNNEL RELEASE Left 12/28/2009   w/ pulley release left long finger  . CARPAL TUNNEL RELEASE Right  07/22/2013   Procedure: RIGHT CARPAL TUNNEL RELEASE;  Surgeon: Cammie Sickle., MD;  Location: [facility];  Service: Orthopedics;  Laterality: Right;  . COLONOSCOPY    . CYSTO/  LEFT RETROGRADE PYELOGRAM  11/21/2010  . CYSTOSCOPY WITH STENT PLACEMENT Left 03/09/2016   Procedure: CYSTOSCOPY WITH STENT PLACEMENT;  Surgeon: Franchot Gallo, MD;  [facility];  Service: Urology;  Laterality: Left;  . CYSTOSCOPY/RETROGRADE/URETEROSCOPY/STONE EXTRACTION WITH BASKET Left 03/09/2016   Procedure: CYSTOSCOPY/RETROGRADE/URETEROSCOPY/STONE EXTRACTION WITH BASKET;  Surgeon: Franchot Gallo, MD;  Location: [facility];  Service: Urology;  Laterality: Left;  . LEFT URETEROSCOPIC LASER LITHOTRIPSY STONE EXTRACTION/ STENT PLACEMENT  05/23/2010  . MOHS SURGERY     TOP OF THE HEAD   . ORIF ANKLE FRACTURE Right 1978  . PORT-A-CATH REMOVAL Right 02/14/2019   Procedure: MINOR REMOVAL PORT-A-CATH;  Surgeon: Aviva Signs, MD;  Location: [facility];  Service: General;  Laterality: Right;  . PORTACATH PLACEMENT Right 08/19/2018   Procedure: INSERTION PORT-A-CATH (attached catheter in right subclavian);  Surgeon: Aviva Signs, MD;  Location: [facility];  Service: General;  Laterality: Right;  . PORTACATH PLACEMENT Left 05/16/2019   Procedure: INSERTION PORT-A-CATH (attached catheter in left subclavian);  Surgeon: Aviva Signs, MD;  Location: [facility];  Service: General;  Laterality: Left;  . POSTERIOR LUMBAR FUSION  08/21/2014   laminectomy and decompression L2 -- L5  . RIGHT LOWER LEG SURGERY  X3  1975 to 1976   including ORIF  . TONSILLECTOMY AND ADENOIDECTOMY  1986  . UMBILICAL HERNIA REPAIR  2009 approx    SOCIAL HISTORY:  Social History   Socioeconomic History  . Marital status: Married    Spouse name: Not on file  . Number of children: Not on file  . Years of education: Not on file  . Highest education level: Not on file  Occupational History  . Not on  file  Tobacco Use  . Smoking status: Former Smoker    Years: 20.00    Types: Cigarettes    Quit date: 07/17/1986    Years since quitting: 33.3  . Smokeless tobacco: Never Used  Vaping Use  . Vaping Use: Never used  Substance and Sexual Activity  . Alcohol use: Yes    Alcohol/week: 7.0 - 14.0 standard drinks    Types: 7 - 14 Cans of beer per week    Comment: 1 -2 beer daily  . Drug use: No  . Sexual activity: Not Currently  Other Topics Concern  . Not on file  Social History Narrative  . Not on file   Social Determinants of Health   Financial Resource Strain: Unknown  . Difficulty of Paying Living Expenses: Patient refused  Food Insecurity: Unknown  . Worried About Estate manager/land agent  of Food in the Last Year: Patient refused  . Ran Out of Food in the Last Year: Patient refused  Transportation Needs: Unknown  . Lack of Transportation (Medical): Patient refused  . Lack of Transportation (Non-Medical): Patient refused  Physical Activity: Unknown  . Days of Exercise per Week: Patient refused  . Minutes of Exercise per Session: Patient refused  Stress: Unknown  . Feeling of Stress : Patient refused  Social Connections: Unknown  . Frequency of Communication with Friends and Family: Patient refused  . Frequency of Social Gatherings with Friends and Family: Patient refused  . Attends Religious Services: Patient refused  . Active Member of Clubs or Organizations: Patient refused  . Attends Archivist Meetings: Patient refused  . Marital Status: Patient refused  Intimate Partner Violence: Unknown  . Fear of Current or Ex-Partner: Patient refused  . Emotionally Abused: Patient refused  . Physically Abused: Patient refused  . Sexually Abused: Patient refused    FAMILY HISTORY:  Family History  Problem Relation Age of Onset  . Stroke Mother   . Prostate cancer Father   . Bone cancer Father   . Diverticulitis Father   . Rheum arthritis Sister   . Urinary tract infection  Sister   . Colon cancer Neg Hx     CURRENT MEDICATIONS:  Current Outpatient Medications  Medication Sig Dispense Refill  . acetaminophen (TYLENOL) 500 MG tablet Take 1,000 mg by mouth 3 (three) times daily.     Marland Kitchen allopurinol (ZYLOPRIM) 300 MG tablet Take 300 mg by mouth at bedtime.     Marland Kitchen amLODipine (NORVASC) 2.5 MG tablet Take 1 tablet (2.5 mg total) by mouth daily. 90 tablet 2  . cetirizine (ZYRTEC) 10 MG tablet Take 10 mg by mouth daily. IN THE MORNING    . Cholecalciferol (VITAMIN D3) 1.25 MG (50000 UT) CAPS Take 1 capsule by mouth daily.    . diclofenac sodium (VOLTAREN) 1 % GEL Apply 1 g topically 3 (three) times daily as needed (knee pain.).     Marland Kitchen docusate sodium (COLACE) 100 MG capsule Take 100 mg by mouth at bedtime.     . lansoprazole (PREVACID) 15 MG capsule Take 15 mg by mouth daily.     . magnesium oxide (MAG-OX) 400 MG tablet Take 1 tablet (400 mg total) by mouth 3 (three) times daily. 90 tablet 2  . tamsulosin (FLOMAX) 0.4 MG CAPS capsule Take 0.4 mg by mouth every evening.     . traMADol (ULTRAM) 50 MG tablet Take 1 tablet (50 mg total) by mouth every 6 (six) hours as needed. for pain 120 tablet 0  . venetoclax 100 MG TABS Take 200 mg by mouth daily. 60 tablet 0  . lidocaine-prilocaine (EMLA) cream Apply 1 application topically See admin instructions. ONE HOUR PRIOR TO CHEMOTHERAPY APPOINTMENT (Patient not taking: Reported on 11/17/2019)     No current facility-administered medications for this visit.   Facility-Administered Medications Ordered in Other Visits  Medication Dose Route Frequency Provider Last Rate Last Admin  . sodium chloride flush (NS) 0.9 % injection 20 mL  20 mL Intravenous PRN [name], MD   20 mL at 07/21/19 1053    ALLERGIES:  No Known Allergies  PHYSICAL EXAM:  Performance status (ECOG): 1 - Symptomatic but completely ambulatory  Vitals:   11/17/19 1035  BP: 137/69  Pulse: 64  Resp: 18  Temp: (!) 97.5 F (36.4 C)  SpO2: 96%    Wt Readings from Last 3 Encounters:  11/17/19 227 lb 4.8 oz (103.1 kg)  10/13/19 226 lb 14.4 oz (102.9 kg)  09/29/19 227 lb 14.4 oz (103.4 kg)   Physical Exam Vitals reviewed.  Constitutional:      Appearance: Normal appearance. He is obese.  Cardiovascular:     Rate and Rhythm: Normal rate and regular rhythm.     Pulses: Normal pulses.     Heart sounds: Normal heart sounds.  Pulmonary:     Effort: Pulmonary effort is normal.     Breath sounds: Normal breath sounds.  Musculoskeletal:     Comments: Port-a-Cath in left shoulder  Neurological:     General: No focal deficit present.     Mental Status: He is alert and oriented to person, place, and time.  Psychiatric:        Mood and Affect: Mood normal.        Behavior: Behavior normal.     LABORATORY DATA:  I have reviewed the labs as listed.  CBC Latest Ref Rng & Units 11/17/2019 10/13/2019 09/29/2019  WBC 4.0 - 10.5 K/uL 4.5 2.1(L) 3.9(L)  Hemoglobin 13.0 - 17.0 g/dL 14.5 14.3 14.6  Hematocrit 39 - 52 % 44.1 43.4 46.2  Platelets 150 - 400 K/uL 186 109(L) 139(L)   CMP Latest Ref Rng & Units 11/17/2019 10/13/2019 09/29/2019  Glucose 70 - 99 mg/dL 118(H) 118(H) 99  BUN 8 - 23 mg/dL 23 25(H) 20  Creatinine 0.61 - 1.24 mg/dL 1.00 1.13 1.03  Sodium 135 - 145 mmol/L 139 141 140  Potassium 3.5 - 5.1 mmol/L 4.5 4.6 4.4  Chloride 98 - 111 mmol/L 107 107 105  CO2 22 - 32 mmol/L 21(L) 26 26  Calcium 8.9 - 10.3 mg/dL 9.4 9.6 9.6  Total Protein 6.5 - 8.1 g/dL 6.4(L) 6.7 6.4(L)  Total Bilirubin 0.3 - 1.2 mg/dL 0.7 0.5 0.6  Alkaline Phos 38 - 126 U/L 65 63 59  AST 15 - 41 U/L 21 22 21   ALT 0 - 44 U/L 25 31 26    Lab Results  Component Value Date   LDH 140 11/17/2019   LDH 153 10/13/2019   LDH 165 09/29/2019    DIAGNOSTIC IMAGING:  I have independently reviewed the scans and discussed with the patient. No results found.   ASSESSMENT:  1.  Acute myeloid leukemia: -7 cycles of decitabine (every 6 weeks) and venetoclax (2 weeks)  from 01/20/2019 through 09/29/2019. -BMBX on 02/25/2019 after cycle 1 did not show any evidence of leukemia. -CT CAP on 02/12/2019 shows numerous bilateral irregular/spiculated pulmonary nodules measuring up to 14 mm, nonspecific.  Hepatic steatosis.  Spleen is normal.   PLAN:  1.  AML: -He is psychologically stressed because of his living situation.  Apparently he cannot sell his house because his wife son is staying with him.  They want to downsize the house. -I have reviewed his labs today.  CBC shows normal white count, hemoglobin and platelets.  LFTs are normal. -He will proceed with cycle 8 of decitabine.  He will also start venetoclax later today.  He will continue for 2 weeks.  I plan to reevaluate him in 2 weeks with labs.  2.  Arthralgias: -Continue tramadol every 6 hours which is helping.  3.  Hypomagnesemia: -Continue magnesium 3 times a day.  Magnesium today is 2.1.  4.  Hypertension: -Continue Norvasc 2.5 mg daily.  Lisinopril on hold.  5.  CKD: -Baseline creatinine is between 1.1-1.5.  Today 1.0.  This has improved since lisinopril on hold.  Orders placed this encounter:  No orders of the defined types were placed in this encounter.    [name], MD [place] [phone]   I, Milinda Antis, am acting as a scribe for Dr. Sanda Linger.  I, [name] MD, have reviewed the above documentation for accuracy and completeness, and I agree with the above.

## 2019-11-17 NOTE — Patient Instructions (Signed)
[facility] Discharge Instructions for Patients Receiving Chemotherapy  Today you received the following chemotherapy agents   To help prevent nausea and vomiting after your treatment, we encourage you to take your nausea medication   If you develop nausea and vomiting that is not controlled by your nausea medication, call the clinic.   BELOW ARE SYMPTOMS THAT SHOULD BE REPORTED IMMEDIATELY:  *FEVER GREATER THAN 100.5 F  *CHILLS WITH OR WITHOUT FEVER  NAUSEA AND VOMITING THAT IS NOT CONTROLLED WITH YOUR NAUSEA MEDICATION  *UNUSUAL SHORTNESS OF BREATH  *UNUSUAL BRUISING OR BLEEDING  TENDERNESS IN MOUTH AND THROAT WITH OR WITHOUT PRESENCE OF ULCERS  *URINARY PROBLEMS  *BOWEL PROBLEMS  UNUSUAL RASH Items with * indicate a potential emergency and should be followed up as soon as possible.  Feel free to call the clinic should you have any questions or concerns. The clinic phone number is [phone].  Please show the CHEMO ALERT CARD at check-in to the Emergency Department and triage nurse.   

## 2019-11-17 NOTE — Patient Instructions (Signed)
[place] at [facility] Discharge Instructions  You were seen today by Dr. Delton Coombes. He went over your recent results. You received your treatment today. Start taking the venetoclax today. Dr. Delton Coombes will see you back in 2 weeks for labs and follow up.   Thank you for choosing [place] at [facility] to provide your oncology and hematology care.  To afford each patient quality time with our provider, please arrive at least 15 minutes before your scheduled appointment time.   If you have a lab appointment with the [facility] please come in thru the Main Entrance and check in at the main information desk  You need to re-schedule your appointment should you arrive 10 or more minutes late.  We strive to give you quality time with our providers, and arriving late affects you and other patients whose appointments are after yours.  Also, if you no show three or more times for appointments you may be dismissed from the clinic at the providers discretion.     Again, thank you for choosing [facility].  Our hope is that these requests will decrease the amount of time that you wait before being seen by our physicians.       _____________________________________________________________  Should you have questions after your visit to [facility], please contact our office at [phone] between the hours of 8:00 a.m. and 4:30 p.m.  Voicemails left after 4:00 p.m. will not be returned until the following business day.  For prescription refill requests, have your pharmacy contact our office and allow 72 hours.    Cancer Center Support Programs:   > Cancer Support Group  2nd Tuesday of the month 1pm-2pm, Journey Room

## 2019-11-18 ENCOUNTER — Inpatient Hospital Stay (HOSPITAL_COMMUNITY): Payer: Medicare Other

## 2019-11-18 VITALS — BP 129/74 | HR 67 | Temp 97.5°F | Resp 16

## 2019-11-18 DIAGNOSIS — D46Z Other myelodysplastic syndromes: Secondary | ICD-10-CM

## 2019-11-18 DIAGNOSIS — Z5111 Encounter for antineoplastic chemotherapy: Secondary | ICD-10-CM | POA: Diagnosis not present

## 2019-11-18 DIAGNOSIS — Z95828 Presence of other vascular implants and grafts: Secondary | ICD-10-CM

## 2019-11-18 DIAGNOSIS — C9200 Acute myeloblastic leukemia, not having achieved remission: Secondary | ICD-10-CM

## 2019-11-18 MED ORDER — SODIUM CHLORIDE 0.9 % IV SOLN
8.0000 mg | Freq: Once | INTRAVENOUS | Status: AC
  Administered 2019-11-18: 8 mg via INTRAVENOUS
  Filled 2019-11-18: qty 4

## 2019-11-18 MED ORDER — SODIUM CHLORIDE 0.9% FLUSH: 10.0000 mL | INTRAVENOUS | Status: DC | PRN

## 2019-11-18 MED ORDER — SODIUM CHLORIDE 0.9 % IV SOLN
15.0000 mg/m2 | Freq: Once | INTRAVENOUS | Status: AC
  Administered 2019-11-18: 35 mg via INTRAVENOUS
  Filled 2019-11-18: qty 7

## 2019-11-18 MED ORDER — SODIUM CHLORIDE 0.9 % IV SOLN: Freq: Once | INTRAVENOUS | Status: AC

## 2019-11-18 MED ORDER — HEPARIN SOD (PORK) LOCK FLUSH 100 UNIT/ML IV SOLN
500.0000 [IU] | Freq: Once | INTRAVENOUS | Status: AC | PRN
  Administered 2019-11-18: 500 [IU]

## 2019-11-18 MED ORDER — SODIUM CHLORIDE 0.9% FLUSH
10.0000 mL | INTRAVENOUS | Status: DC | PRN
  Administered 2019-11-18: 10 mL

## 2019-11-18 NOTE — Progress Notes (Signed)
Patient here today for day 2 of treatment. No new complaints at this time.   Treatment given per orders. Patient tolerated it well without problems. Vitals stable and discharged home from clinic ambulatory. Follow up as scheduled.

## 2019-11-18 NOTE — Patient Instructions (Signed)
[facility] Discharge Instructions for Patients Receiving Chemotherapy  Today you received the following chemotherapy agents   To help prevent nausea and vomiting after your treatment, we encourage you to take your nausea medication   If you develop nausea and vomiting that is not controlled by your nausea medication, call the clinic.   BELOW ARE SYMPTOMS THAT SHOULD BE REPORTED IMMEDIATELY:  *FEVER GREATER THAN 100.5 F  *CHILLS WITH OR WITHOUT FEVER  NAUSEA AND VOMITING THAT IS NOT CONTROLLED WITH YOUR NAUSEA MEDICATION  *UNUSUAL SHORTNESS OF BREATH  *UNUSUAL BRUISING OR BLEEDING  TENDERNESS IN MOUTH AND THROAT WITH OR WITHOUT PRESENCE OF ULCERS  *URINARY PROBLEMS  *BOWEL PROBLEMS  UNUSUAL RASH Items with * indicate a potential emergency and should be followed up as soon as possible.  Feel free to call the clinic should you have any questions or concerns. The clinic phone number is [phone].  Please show the CHEMO ALERT CARD at check-in to the Emergency Department and triage nurse.   

## 2019-11-19 ENCOUNTER — Inpatient Hospital Stay (HOSPITAL_COMMUNITY): Payer: Medicare Other

## 2019-11-19 ENCOUNTER — Other Ambulatory Visit: Payer: Self-pay

## 2019-11-19 VITALS — BP 138/82 | HR 70 | Temp 98.7°F | Resp 16

## 2019-11-19 DIAGNOSIS — Z95828 Presence of other vascular implants and grafts: Secondary | ICD-10-CM

## 2019-11-19 DIAGNOSIS — Z5111 Encounter for antineoplastic chemotherapy: Secondary | ICD-10-CM | POA: Diagnosis not present

## 2019-11-19 DIAGNOSIS — C9200 Acute myeloblastic leukemia, not having achieved remission: Secondary | ICD-10-CM

## 2019-11-19 DIAGNOSIS — D46Z Other myelodysplastic syndromes: Secondary | ICD-10-CM

## 2019-11-19 MED ORDER — SODIUM CHLORIDE 0.9 % IV SOLN: Freq: Once | INTRAVENOUS | Status: AC

## 2019-11-19 MED ORDER — SODIUM CHLORIDE 0.9 % IV SOLN
15.0000 mg/m2 | Freq: Once | INTRAVENOUS | Status: AC
  Administered 2019-11-19: 35 mg via INTRAVENOUS
  Filled 2019-11-19: qty 7

## 2019-11-19 MED ORDER — SODIUM CHLORIDE 0.9% FLUSH
10.0000 mL | INTRAVENOUS | Status: DC | PRN
  Administered 2019-11-19: 10 mL

## 2019-11-19 MED ORDER — HEPARIN SOD (PORK) LOCK FLUSH 100 UNIT/ML IV SOLN
500.0000 [IU] | Freq: Once | INTRAVENOUS | Status: AC | PRN
  Administered 2019-11-19: 500 [IU]

## 2019-11-19 MED ORDER — SODIUM CHLORIDE 0.9 % IV SOLN
8.0000 mg | Freq: Once | INTRAVENOUS | Status: AC
  Administered 2019-11-19: 8 mg via INTRAVENOUS
  Filled 2019-11-19: qty 4

## 2019-11-19 NOTE — Patient Instructions (Signed)
[facility] Discharge Instructions for Patients Receiving Chemotherapy  Today you received the following chemotherapy agents   To help prevent nausea and vomiting after your treatment, we encourage you to take your nausea medication   If you develop nausea and vomiting that is not controlled by your nausea medication, call the clinic.   BELOW ARE SYMPTOMS THAT SHOULD BE REPORTED IMMEDIATELY:  *FEVER GREATER THAN 100.5 F  *CHILLS WITH OR WITHOUT FEVER  NAUSEA AND VOMITING THAT IS NOT CONTROLLED WITH YOUR NAUSEA MEDICATION  *UNUSUAL SHORTNESS OF BREATH  *UNUSUAL BRUISING OR BLEEDING  TENDERNESS IN MOUTH AND THROAT WITH OR WITHOUT PRESENCE OF ULCERS  *URINARY PROBLEMS  *BOWEL PROBLEMS  UNUSUAL RASH Items with * indicate a potential emergency and should be followed up as soon as possible.  Feel free to call the clinic should you have any questions or concerns. The clinic phone number is [phone].  Please show the CHEMO ALERT CARD at check-in to the Emergency Department and triage nurse.   

## 2019-11-19 NOTE — Progress Notes (Signed)
Here for day 3 of treatment.  VS WNL.  Tolerated treatment well.  Discharged ambulatory.

## 2019-11-20 ENCOUNTER — Inpatient Hospital Stay (HOSPITAL_COMMUNITY): Payer: Medicare Other

## 2019-11-20 VITALS — BP 133/77 | HR 68 | Temp 97.9°F | Resp 18

## 2019-11-20 DIAGNOSIS — Z5111 Encounter for antineoplastic chemotherapy: Secondary | ICD-10-CM | POA: Diagnosis not present

## 2019-11-20 DIAGNOSIS — D46Z Other myelodysplastic syndromes: Secondary | ICD-10-CM

## 2019-11-20 DIAGNOSIS — Z95828 Presence of other vascular implants and grafts: Secondary | ICD-10-CM

## 2019-11-20 DIAGNOSIS — C9200 Acute myeloblastic leukemia, not having achieved remission: Secondary | ICD-10-CM

## 2019-11-20 MED ORDER — SODIUM CHLORIDE 0.9 % IV SOLN
8.0000 mg | Freq: Once | INTRAVENOUS | Status: AC
  Administered 2019-11-20: 8 mg via INTRAVENOUS
  Filled 2019-11-20: qty 4

## 2019-11-20 MED ORDER — SODIUM CHLORIDE 0.9 % IV SOLN
15.0000 mg/m2 | Freq: Once | INTRAVENOUS | Status: AC
  Administered 2019-11-20: 35 mg via INTRAVENOUS
  Filled 2019-11-20: qty 7

## 2019-11-20 MED ORDER — SODIUM CHLORIDE 0.9 % IV SOLN: Freq: Once | INTRAVENOUS | Status: AC

## 2019-11-20 MED ORDER — HEPARIN SOD (PORK) LOCK FLUSH 100 UNIT/ML IV SOLN
500.0000 [IU] | Freq: Once | INTRAVENOUS | Status: AC | PRN
  Administered 2019-11-20: 500 [IU]

## 2019-11-20 MED ORDER — SODIUM CHLORIDE 0.9% FLUSH
10.0000 mL | INTRAVENOUS | Status: DC | PRN
  Administered 2019-11-20 (×2): 10 mL

## 2019-11-20 NOTE — Progress Notes (Signed)
Patient presents today for treatment. Vital signs stable. Patient has no complaints of any changes since his last treatment. MAR reviewed.   Treatment given today per MD orders. Tolerated infusion without adverse affects. Vital signs stable. No complaints at this time. Discharged from clinic ambulatory. F/U with [facility] as scheduled.

## 2019-11-20 NOTE — Patient Instructions (Signed)
[facility] Discharge Instructions for Patients Receiving Chemotherapy  Today you received the following chemotherapy agents   To help prevent nausea and vomiting after your treatment, we encourage you to take your nausea medication   If you develop nausea and vomiting that is not controlled by your nausea medication, call the clinic.   BELOW ARE SYMPTOMS THAT SHOULD BE REPORTED IMMEDIATELY:  *FEVER GREATER THAN 100.5 F  *CHILLS WITH OR WITHOUT FEVER  NAUSEA AND VOMITING THAT IS NOT CONTROLLED WITH YOUR NAUSEA MEDICATION  *UNUSUAL SHORTNESS OF BREATH  *UNUSUAL BRUISING OR BLEEDING  TENDERNESS IN MOUTH AND THROAT WITH OR WITHOUT PRESENCE OF ULCERS  *URINARY PROBLEMS  *BOWEL PROBLEMS  UNUSUAL RASH Items with * indicate a potential emergency and should be followed up as soon as possible.  Feel free to call the clinic should you have any questions or concerns. The clinic phone number is [phone].  Please show the CHEMO ALERT CARD at check-in to the Emergency Department and triage nurse.   

## 2019-11-21 ENCOUNTER — Inpatient Hospital Stay (HOSPITAL_COMMUNITY): Payer: Medicare Other

## 2019-11-21 ENCOUNTER — Other Ambulatory Visit: Payer: Self-pay

## 2019-11-21 VITALS — BP 138/72 | HR 70 | Temp 96.9°F | Resp 18

## 2019-11-21 DIAGNOSIS — Z5111 Encounter for antineoplastic chemotherapy: Secondary | ICD-10-CM | POA: Diagnosis not present

## 2019-11-21 DIAGNOSIS — D46Z Other myelodysplastic syndromes: Secondary | ICD-10-CM

## 2019-11-21 DIAGNOSIS — Z95828 Presence of other vascular implants and grafts: Secondary | ICD-10-CM

## 2019-11-21 DIAGNOSIS — C9200 Acute myeloblastic leukemia, not having achieved remission: Secondary | ICD-10-CM

## 2019-11-21 MED ORDER — SODIUM CHLORIDE 0.9 % IV SOLN
15.0000 mg/m2 | Freq: Once | INTRAVENOUS | Status: AC
  Administered 2019-11-21: 35 mg via INTRAVENOUS
  Filled 2019-11-21: qty 7

## 2019-11-21 MED ORDER — HEPARIN SOD (PORK) LOCK FLUSH 100 UNIT/ML IV SOLN
500.0000 [IU] | Freq: Once | INTRAVENOUS | Status: AC | PRN
  Administered 2019-11-21: 500 [IU]

## 2019-11-21 MED ORDER — SODIUM CHLORIDE 0.9 % IV SOLN: Freq: Once | INTRAVENOUS | Status: AC

## 2019-11-21 MED ORDER — SODIUM CHLORIDE 0.9% FLUSH
10.0000 mL | INTRAVENOUS | Status: DC | PRN
  Administered 2019-11-21: 10 mL

## 2019-11-21 MED ORDER — SODIUM CHLORIDE 0.9 % IV SOLN
8.0000 mg | Freq: Once | INTRAVENOUS | Status: AC
  Administered 2019-11-21: 8 mg via INTRAVENOUS
  Filled 2019-11-21: qty 4

## 2019-11-24 ENCOUNTER — Other Ambulatory Visit (HOSPITAL_COMMUNITY): Payer: Self-pay | Admitting: Hematology

## 2019-11-24 DIAGNOSIS — M25559 Pain in unspecified hip: Secondary | ICD-10-CM

## 2019-11-24 DIAGNOSIS — C9200 Acute myeloblastic leukemia, not having achieved remission: Secondary | ICD-10-CM

## 2019-12-02 ENCOUNTER — Other Ambulatory Visit: Payer: Self-pay

## 2019-12-02 ENCOUNTER — Encounter (HOSPITAL_COMMUNITY): Payer: Self-pay | Admitting: Hematology

## 2019-12-02 ENCOUNTER — Inpatient Hospital Stay (HOSPITAL_COMMUNITY): Payer: Medicare Other

## 2019-12-02 ENCOUNTER — Inpatient Hospital Stay (HOSPITAL_BASED_OUTPATIENT_CLINIC_OR_DEPARTMENT_OTHER): Payer: Medicare Other | Admitting: Hematology

## 2019-12-02 VITALS — BP 123/89 | HR 93 | Temp 98.8°F | Resp 18 | Wt 227.5 lb

## 2019-12-02 DIAGNOSIS — Z5111 Encounter for antineoplastic chemotherapy: Secondary | ICD-10-CM | POA: Diagnosis not present

## 2019-12-02 DIAGNOSIS — C9200 Acute myeloblastic leukemia, not having achieved remission: Secondary | ICD-10-CM | POA: Diagnosis not present

## 2019-12-02 LAB — COMPREHENSIVE METABOLIC PANEL
ALT: 36 U/L (ref 0–44)
AST: 27 U/L (ref 15–41)
Albumin: 4.1 g/dL (ref 3.5–5.0)
Alkaline Phosphatase: 66 U/L (ref 38–126)
Anion gap: 9 (ref 5–15)
BUN: 22 mg/dL (ref 8–23)
CO2: 21 mmol/L — ABNORMAL LOW (ref 22–32)
Calcium: 9.3 mg/dL (ref 8.9–10.3)
Chloride: 111 mmol/L (ref 98–111)
Creatinine, Ser: 0.95 mg/dL (ref 0.61–1.24)
GFR calc Af Amer: >60 mL/min (ref >60)
GFR calc non Af Amer: >60 mL/min (ref >60)
Glucose, Bld: 109 mg/dL — ABNORMAL HIGH (ref 70–99)
Potassium: 4.3 mmol/L (ref 3.5–5.1)
Sodium: 141 mmol/L (ref 135–145)
Total Bilirubin: 0.8 mg/dL (ref 0.3–1.2)
Total Protein: 6.4 g/dL — ABNORMAL LOW (ref 6.5–8.1)

## 2019-12-02 LAB — CBC WITH DIFFERENTIAL/PLATELET
Abs Immature Granulocytes: 0.03 10*3/uL (ref 0.00–0.07)
Basophils Absolute: 0 10*3/uL (ref 0.0–0.1)
Basophils Relative: 0 %
Eosinophils Absolute: 0 10*3/uL (ref 0.0–0.5)
Eosinophils Relative: 0 %
HCT: 42.9 % (ref 39.0–52.0)
Hemoglobin: 14.1 g/dL (ref 13.0–17.0)
Immature Granulocytes: 1 %
Lymphocytes Relative: 39 %
Lymphs Abs: 0.9 10*3/uL (ref 0.7–4.0)
MCH: 34 pg (ref 26.0–34.0)
MCHC: 32.9 g/dL (ref 30.0–36.0)
MCV: 103.4 fL — ABNORMAL HIGH (ref 80.0–100.0)
Monocytes Absolute: 0.3 10*3/uL (ref 0.1–1.0)
Monocytes Relative: 12 %
Neutro Abs: 1.1 10*3/uL — ABNORMAL LOW (ref 1.7–7.7)
Neutrophils Relative %: 48 %
Platelets: 106 10*3/uL — ABNORMAL LOW (ref 150–400)
RBC: 4.15 MIL/uL — ABNORMAL LOW (ref 4.22–5.81)
RDW: 15.6 % — ABNORMAL HIGH (ref 11.5–15.5)
WBC: 2.2 10*3/uL — ABNORMAL LOW (ref 4.0–10.5)
nRBC: 0 % (ref 0.0–0.2)

## 2019-12-02 LAB — MAGNESIUM: Magnesium: 1.8 mg/dL (ref 1.7–2.4)

## 2019-12-02 LAB — LACTATE DEHYDROGENASE: LDH: 146 U/L (ref 98–192)

## 2019-12-02 NOTE — Patient Instructions (Signed)
[facility] at [facility] Discharge Instructions  You were seen today by Dr. Katragadda. He went over your recent results. Dr. Katragadda will see you back in for labs and follow up.   Thank you for choosing [facility] at [facility] to provide your oncology and hematology care.  To afford each patient quality time with our provider, please arrive at least 15 minutes before your scheduled appointment time.   If you have a lab appointment with the Cancer Center please come in thru the Main Entrance and check in at the main information desk  You need to re-schedule your appointment should you arrive 10 or more minutes late.  We strive to give you quality time with our providers, and arriving late affects you and other patients whose appointments are after yours.  Also, if you no show three or more times for appointments you may be dismissed from the clinic at the providers discretion.     Again, thank you for choosing [facility].  Our hope is that these requests will decrease the amount of time that you wait before being seen by our physicians.       _____________________________________________________________  Should you have questions after your visit to [facility], please contact our office at [phone] between the hours of 8:00 a.m. and 4:30 p.m.  Voicemails left after 4:00 p.m. will not be returned until the following business day.  For prescription refill requests, have your pharmacy contact our office and allow 72 hours.    Cancer Center Support Programs:   > Cancer Support Group  2nd Tuesday of the month 1pm-2pm, Journey Room    

## 2019-12-02 NOTE — Progress Notes (Signed)
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[place])   08/22/2018 - 12/31/2018 Chemotherapy   The patient had palonosetron (ALOXI) injection 0.25 mg, 0.25 mg, Intravenous,  Once, 4 of 6 cycles Administration: 0.25 mg (08/22/2018), 0.25 mg (08/26/2018), 0.25 mg (08/28/2018), 0.25 mg (08/30/2018), 0.25 mg (10/01/2018), 0.25 mg (10/02/2018), 0.25 mg (11/04/2018), 0.25 mg (09/23/2018), 0.25 mg (09/25/2018), 0.25 mg (09/27/2018), 0.25 mg (10/28/2018), 0.25 mg (10/30/2018), 0.25 mg (11/01/2018), 0.25 mg (12/02/2018), 0.25 mg (12/04/2018), 0.25 mg (12/06/2018) azaCITIDine (VIDAZA) 100 mg in sodium chloride 0.9 % 50 mL chemo infusion, 110 mg (66.7 % of original dose 75 mg/m2), Intravenous, Once, 4 of 6 cycles Dose modification: 50 mg/m2 (original dose 75 mg/m2, Cycle 1, Reason: Provider Judgment), 50 mg/m2 (original dose 75 mg/m2, Cycle 2, Reason: Provider Judgment) Administration: 100 mg (08/22/2018), 100 mg (08/23/2018), 100 mg (08/26/2018), 100 mg (08/27/2018), 100 mg (08/28/2018), 100 mg (08/29/2018), 100 mg (08/30/2018), 100 mg (10/01/2018), 100 mg (10/02/2018), 100 mg (11/04/2018), 100 mg (11/05/2018), 100 mg (09/23/2018), 100 mg (09/24/2018), 100 mg (09/25/2018), 100 mg (09/26/2018), 100 mg (09/27/2018), 100 mg (10/28/2018), 100 mg (10/29/2018), 100 mg (10/30/2018), 100 mg (10/31/2018), 100 mg (11/01/2018), 100 mg (12/02/2018), 100 mg (12/03/2018), 100 mg (12/04/2018), 100 mg (12/05/2018), 100 mg (12/06/2018)  for chemotherapy treatment.    01/20/2019 -  Chemotherapy   The patient had decitabine (DACOGEN) 45 mg in sodium chloride 0.9 % 50 mL chemo  infusion, 20 mg/m2 = 45 mg, Intravenous,  Once, 8 of 10 cycles Dose modification: 15 mg/m2 (original dose 20 mg/m2, Cycle 2, Reason: Other (see comments), Comment: neutropenic fever) Administration: 45 mg (01/20/2019), 45 mg (01/21/2019), 45 mg (01/22/2019), 45 mg (01/23/2019), 45 mg (01/24/2019), 35 mg (03/17/2019), 35 mg (03/18/2019), 35 mg (03/19/2019), 35 mg (03/20/2019), 35 mg (03/21/2019), 35 mg (04/22/2019), 35 mg (04/23/2019), 35 mg (04/24/2019), 35 mg (04/25/2019), 35 mg (05/26/2019), 35 mg (05/27/2019), 35 mg (05/28/2019), 35 mg (05/29/2019), 35 mg (05/30/2019), 35 mg (07/07/2019), 35 mg (07/08/2019), 35 mg (07/09/2019), 35 mg (07/10/2019), 35 mg (07/11/2019), 35 mg (08/18/2019), 35 mg (08/19/2019), 35 mg (08/20/2019), 35 mg (08/21/2019), 35 mg (08/22/2019), 35 mg (09/29/2019), 35 mg (09/30/2019), 35 mg (10/01/2019), 35 mg (10/02/2019), 35 mg (10/03/2019), 35 mg (11/17/2019), 35 mg (11/18/2019), 35 mg (11/19/2019), 35 mg (11/20/2019), 35 mg (11/21/2019) ondansetron (ZOFRAN) 8 mg in sodium chloride 0.9 % 50 mL IVPB, 8 mg (100 % of original dose 8 mg), Intravenous,  Once, 8 of 10 cycles Dose modification: 8 mg (original dose 8 mg, Cycle 1) Administration: 8 mg (01/20/2019), 8 mg (01/21/2019), 8 mg (01/22/2019), 8 mg (01/23/2019), 8 mg (01/24/2019), 8 mg (03/17/2019), 8 mg (03/18/2019), 8 mg (03/19/2019), 8 mg (03/20/2019), 8 mg (03/21/2019), 8 mg (04/22/2019), 8 mg (04/23/2019), 8 mg (04/24/2019), 8 mg (04/25/2019), 8 mg (05/26/2019), 8 mg (05/27/2019), 8 mg (05/28/2019), 8 mg (05/29/2019), 8 mg (05/30/2019), 8 mg (07/07/2019), 8 mg (07/08/2019), 8 mg (07/09/2019), 8 mg (07/10/2019), 8 mg (07/11/2019), 8 mg (08/18/2019), 8 mg (08/19/2019), 8 mg (08/20/2019), 8 mg (08/21/2019), 8 mg (08/22/2019), 8 mg (09/29/2019), 8 mg (09/30/2019), 8 mg (10/01/2019), 8 mg (10/02/2019), 8 mg (10/03/2019), 8 mg (11/17/2019),  8 mg (11/18/2019), 8 mg (11/19/2019), 8 mg (11/20/2019), 8 mg (11/21/2019)  for chemotherapy treatment.    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[place])   01/20/2019 -  Chemotherapy   The patient had decitabine (DACOGEN) 45 mg in sodium chloride 0.9 % 50 mL chemo infusion, 20 mg/m2 = 45 mg, Intravenous,  Once, 8 of 10 cycles Dose modification: 15 mg/m2 (original dose 20 mg/m2, Cycle 2, Reason: Other (see comments), Comment: neutropenic fever) Administration: 45 mg (01/20/2019), 45 mg (01/21/2019), 45 mg (01/22/2019), 45 mg (01/23/2019), 45 mg (01/24/2019), 35 mg (03/17/2019), 35 mg (03/18/2019), 35 mg (03/19/2019), 35 mg (03/20/2019), 35 mg (03/21/2019), 35 mg (04/22/2019), 35 mg (04/23/2019), 35 mg (04/24/2019), 35 mg (04/25/2019), 35 mg (05/26/2019), 35 mg (05/27/2019), 35 mg (05/28/2019), 35 mg (05/29/2019), 35 mg (05/30/2019), 35 mg (07/07/2019), 35 mg (07/08/2019), 35 mg (07/09/2019), 35 mg (07/10/2019), 35 mg (07/11/2019), 35 mg (08/18/2019), 35 mg (08/19/2019), 35 mg (08/20/2019), 35 mg (08/21/2019), 35 mg (08/22/2019), 35 mg (09/29/2019), 35 mg (09/30/2019), 35 mg (10/01/2019), 35 mg (10/02/2019), 35 mg (10/03/2019), 35 mg (11/17/2019), 35 mg (11/18/2019), 35 mg (11/19/2019), 35 mg (11/20/2019), 35 mg (11/21/2019) ondansetron (ZOFRAN) 8 mg in sodium chloride 0.9 % 50 mL IVPB, 8 mg (100 % of original dose 8 mg), Intravenous,  Once, 8 of 10 cycles Dose modification: 8 mg (original dose 8 mg, Cycle 1) Administration: 8 mg (01/20/2019), 8 mg (01/21/2019), 8 mg (01/22/2019), 8 mg (01/23/2019), 8 mg (01/24/2019), 8 mg (03/17/2019), 8 mg (03/18/2019), 8 mg (03/19/2019), 8 mg (03/20/2019), 8 mg (03/21/2019), 8 mg (04/22/2019), 8 mg (04/23/2019), 8 mg (04/24/2019), 8 mg (04/25/2019), 8 mg (05/26/2019), 8 mg (05/27/2019), 8 mg (05/28/2019), 8 mg (05/29/2019), 8 mg (05/30/2019), 8 mg (07/07/2019), 8 mg (07/08/2019), 8 mg (07/09/2019), 8 mg (07/10/2019), 8 mg (07/11/2019), 8 mg (08/18/2019), 8 mg (08/19/2019), 8 mg (08/20/2019), 8 mg (08/21/2019), 8 mg (08/22/2019), 8 mg (09/29/2019), 8 mg (09/30/2019), 8 mg (10/01/2019), 8 mg (10/02/2019), 8 mg (10/03/2019), 8 mg (11/17/2019),  8 mg (11/18/2019), 8 mg (11/19/2019), 8 mg (11/20/2019), 8 mg (11/21/2019)  for chemotherapy treatment.      CANCER STAGING: Cancer Staging No matching staging information was found for the patient.  INTERVAL HISTORY:  [name], a 74 y.o. male, returns for routine follow-up of his AML. Trayson was last seen on 11/17/2019.   He continues to have significant joint pain, which he takes tramadol to treat.  Denies any fevers or chills.  Appetite is 75%.  Energy levels are 25%.  He is continuing to tolerate venetoclax very well.   REVIEW OF SYSTEMS:  Review of Systems  Constitutional: Positive for appetite change (mild) and fatigue (severe).  HENT:  Negative.   Eyes: Negative.   Respiratory: Negative.   Cardiovascular: Negative.   Gastrointestinal: Negative.   Endocrine: Negative.   Genitourinary: Negative.    Musculoskeletal: Negative.   Skin: Negative.   Neurological: Negative.   Hematological: Negative.   Psychiatric/Behavioral: Negative.   All other systems reviewed and are negative.   PAST MEDICAL/SURGICAL HISTORY:  Past Medical History:  Diagnosis Date  . Arthritis   . 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 Cancer ([place]) 01-28-2014   skin cancer  . CKD (chronic kidney disease), stage III   . GERD (gastroesophageal reflux disease)   . Heart murmur    NOTED DURING PHYSICAL WHEN HE WAS ENLISTING IN [facility] , DIDNT KNOW UNTIL THAT  TIME AND REPORTS , "THATS THE LAST I HEARD ABOUT IT "   . History of hypertension    no longer issue  . History of kidney stones   . History of malignant melanoma of skin    excision top of scalp 2015-- no recurrence  . History of urinary retention    post op lumbar fusion surgery 04/ 2016  . Hypertension   . 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 Left ureteral calculus   . Seasonal allergies   . Wears glasses   . Wears glasses   . Wears partial dentures      upper and lower   Past Surgical History:  Procedure Laterality Date  . ANKLE FUSION Right 2007  . CARPAL TUNNEL RELEASE Left 12/28/2009   w/ pulley release left long finger  . CARPAL TUNNEL RELEASE Right 07/22/2013   Procedure: RIGHT CARPAL TUNNEL RELEASE;  Surgeon: Cammie Sickle., MD;  Location: [facility];  Service: Orthopedics;  Laterality: Right;  . COLONOSCOPY    . CYSTO/  LEFT RETROGRADE PYELOGRAM  11/21/2010  . CYSTOSCOPY WITH STENT PLACEMENT Left 03/09/2016   Procedure: CYSTOSCOPY WITH STENT PLACEMENT;  Surgeon: Franchot Gallo, MD;  Location: [facility];  Service: Urology;  Laterality: Left;  . CYSTOSCOPY/RETROGRADE/URETEROSCOPY/STONE EXTRACTION WITH BASKET Left 03/09/2016   Procedure: CYSTOSCOPY/RETROGRADE/URETEROSCOPY/STONE EXTRACTION WITH BASKET;  Surgeon: Franchot Gallo, MD;  Location: [facility];  Service: Urology;  Laterality: Left;  . LEFT URETEROSCOPIC LASER LITHOTRIPSY STONE EXTRACTION/ STENT PLACEMENT  05/23/2010  . MOHS SURGERY     TOP OF THE HEAD   . ORIF ANKLE FRACTURE Right 1978  . PORT-A-CATH REMOVAL Right 02/14/2019   Procedure: MINOR REMOVAL PORT-A-CATH;  Surgeon: Aviva Signs, MD;  Location: [facility];  Service: General;  Laterality: Right;  . PORTACATH PLACEMENT Right 08/19/2018   Procedure: INSERTION PORT-A-CATH (attached catheter in right subclavian);  Surgeon: Aviva Signs, MD;  Location: [facility];  Service: General;  Laterality: Right;  . PORTACATH PLACEMENT Left 05/16/2019   Procedure: INSERTION PORT-A-CATH (attached catheter in left subclavian);  Surgeon: Aviva Signs, MD;  Location: [facility];  Service: General;  Laterality: Left;  . POSTERIOR LUMBAR FUSION  08/21/2014   laminectomy and decompression L2 -- L5  . RIGHT LOWER LEG SURGERY  X3  1975 to 1976   including ORIF  . TONSILLECTOMY AND ADENOIDECTOMY  1986  . UMBILICAL HERNIA REPAIR  2009 approx    SOCIAL HISTORY:  Social History   Socioeconomic  History  . Marital status: Married    Spouse name: Not on file  . Number of children: Not on file  . Years of education: Not on file  . Highest education level: Not on file  Occupational History  . Not on file  Tobacco Use  . Smoking status: Former Smoker    Years: 20.00    Types: Cigarettes    Quit date: 07/17/1986    Years since quitting: 33.4  . Smokeless tobacco: Never Used  Vaping Use  . Vaping Use: Never used  Substance and Sexual Activity  . Alcohol use: Yes    Alcohol/week: 7.0 - 14.0 standard drinks    Types: 7 - 14 Cans of beer per week    Comment: 1 -2 beer daily  . Drug use: No  . Sexual activity: Not Currently  Other Topics Concern  . Not on file  Social History Narrative  .  Not on file   Social Determinants of Health   Financial Resource Strain: Unknown  . Difficulty of Paying Living Expenses: Patient refused  Food Insecurity: Unknown  . Worried About Charity fundraiser in the Last Year: Patient refused  . Ran Out of Food in the Last Year: Patient refused  Transportation Needs: Unknown  . Lack of Transportation (Medical): Patient refused  . Lack of Transportation (Non-Medical): Patient refused  Physical Activity: Unknown  . Days of Exercise per Week: Patient refused  . Minutes of Exercise per Session: Patient refused  Stress: Unknown  . Feeling of Stress : Patient refused  Social Connections: Unknown  . Frequency of Communication with Friends and Family: Patient refused  . Frequency of Social Gatherings with Friends and Family: Patient refused  . Attends Religious Services: Patient refused  . Active Member of Clubs or Organizations: Patient refused  . Attends Archivist Meetings: Patient refused  . Marital Status: Patient refused  Intimate Partner Violence: Unknown  . Fear of Current or Ex-Partner: Patient refused  . Emotionally Abused: Patient refused  . Physically Abused: Patient refused  . Sexually Abused: Patient refused    FAMILY  HISTORY:  Family History  Problem Relation Age of Onset  . Stroke Mother   . Prostate cancer Father   . Bone cancer Father   . Diverticulitis Father   . Rheum arthritis Sister   . Urinary tract infection Sister   . Colon cancer Neg Hx     CURRENT MEDICATIONS:  Current Outpatient Medications  Medication Sig Dispense Refill  . acetaminophen (TYLENOL) 500 MG tablet Take 1,000 mg by mouth 3 (three) times daily.     Marland Kitchen allopurinol (ZYLOPRIM) 300 MG tablet Take 300 mg by mouth at bedtime.     Marland Kitchen amLODipine (NORVASC) 2.5 MG tablet Take 1 tablet (2.5 mg total) by mouth daily. 90 tablet 2  . cetirizine (ZYRTEC) 10 MG tablet Take 10 mg by mouth daily. IN THE MORNING    . Cholecalciferol (VITAMIN D3) 1.25 MG (50000 UT) CAPS Take 1 capsule by mouth daily.    . diclofenac sodium (VOLTAREN) 1 % GEL Apply 1 g topically 3 (three) times daily as needed (knee pain.).     Marland Kitchen docusate sodium (COLACE) 100 MG capsule Take 100 mg by mouth at bedtime.     . lansoprazole (PREVACID) 15 MG capsule Take 15 mg by mouth daily.     Marland Kitchen lidocaine-prilocaine (EMLA) cream Apply 1 application topically See admin instructions. ONE HOUR PRIOR TO CHEMOTHERAPY APPOINTMENT    . magnesium oxide (MAG-OX) 400 MG tablet Take 1 tablet (400 mg total) by mouth 3 (three) times daily. 90 tablet 2  . tamsulosin (FLOMAX) 0.4 MG CAPS capsule Take 0.4 mg by mouth every evening.     . traMADol (ULTRAM) 50 MG tablet TAKE 1 TABLET BY MOUTH EVERY 6 HOURS AS NEEDED FOR PAIN 120 tablet 0  . VENCLEXTA 100 MG TABS TAKE 2 TABLETS (200 MG) BY MOUTH DAILY 60 tablet 0   No current facility-administered medications for this visit.   Facility-Administered Medications Ordered in Other Visits  Medication Dose Route Frequency Provider Last Rate Last Admin  . sodium chloride flush (NS) 0.9 % injection 10 mL  10 mL Intracatheter PRN [name], MD   10 mL at 11/21/19 1030  . sodium chloride flush (NS) 0.9 % injection 20 mL  20 mL Intravenous PRN  [name], MD   20 mL at 07/21/19 1053  ALLERGIES:  No Known Allergies  PHYSICAL EXAM:  Performance status (ECOG): 1 - Symptomatic but completely ambulatory  Vitals:   12/02/19 1012  BP: (!) 123/89  Pulse: 93  Resp: 18  Temp: 98.8 F (37.1 C)  SpO2: 95%   Wt Readings from Last 3 Encounters:  12/02/19 (!) 227 lb 8 oz (103.2 kg)  11/17/19 227 lb 4.8 oz (103.1 kg)  10/13/19 226 lb 14.4 oz (102.9 kg)   Physical Exam Vitals reviewed.  Constitutional:      Appearance: Normal appearance.  HENT:     Mouth/Throat:     Mouth: Mucous membranes are moist.  Eyes:     Pupils: Pupils are equal, round, and reactive to light.  Cardiovascular:     Rate and Rhythm: Normal rate and regular rhythm.     Pulses: Normal pulses.     Heart sounds: Normal heart sounds.  Pulmonary:     Effort: Pulmonary effort is normal.     Breath sounds: Normal breath sounds.  Abdominal:     Palpations: Abdomen is soft. There is no mass.     Tenderness: There is no abdominal tenderness.  Musculoskeletal:     Right lower leg: 1+ Edema (R > L) present.     Left lower leg: 1+ Edema present.  Neurological:     Mental Status: He is alert and oriented to person, place, and time.  Psychiatric:        Mood and Affect: Mood normal.        Behavior: Behavior normal.      LABORATORY DATA:  I have reviewed the labs as listed.  CBC Latest Ref Rng & Units 12/02/2019 11/17/2019 10/13/2019  WBC 4.0 - 10.5 K/uL 2.2(L) 4.5 2.1(L)  Hemoglobin 13.0 - 17.0 g/dL 14.1 14.5 14.3  Hematocrit 39 - 52 % 42.9 44.1 43.4  Platelets 150 - 400 K/uL 106(L) 186 109(L)   CMP Latest Ref Rng & Units 12/02/2019 11/17/2019 10/13/2019  Glucose 70 - 99 mg/dL 109(H) 118(H) 118(H)  BUN 8 - 23 mg/dL 22 23 25(H)  Creatinine 0.61 - 1.24 mg/dL 0.95 1.00 1.13  Sodium 135 - 145 mmol/L 141 139 141  Potassium 3.5 - 5.1 mmol/L 4.3 4.5 4.6  Chloride 98 - 111 mmol/L 111 107 107  CO2 22 - 32 mmol/L 21(L) 21(L) 26  Calcium 8.9 - 10.3 mg/dL  9.3 9.4 9.6  Total Protein 6.5 - 8.1 g/dL 6.4(L) 6.4(L) 6.7  Total Bilirubin 0.3 - 1.2 mg/dL 0.8 0.7 0.5  Alkaline Phos 38 - 126 U/L 66 65 63  AST 15 - 41 U/L 27 21 22   ALT 0 - 44 U/L 36 25 31    DIAGNOSTIC IMAGING:  I have independently reviewed the scans and discussed with the patient. No results found.    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  -BMBX on 02/25/2019 after cycle 1 did not show any evidence of leukemia. -CT CAP on 02/12/2019 shows numerous bilateral irregular/spiculated pulmonary nodules measuring up to 14 mm, nonspecific. Hepatic steatosis. Spleen is normal.   PLAN:  1. AML: -He has tolerated last cycle of decitabine very well. -I reviewed labs which shows decreasing white count of 2.2 and platelet count of 106.  ANC is 1100.  LFTs are normal. -He does not require any blood transfusion today.  I have told him to discontinue venetoclax today. -I will reevaluate him in 4  weeks prior to start of next cycle.  2. Arthralgias: -Continue tramadol every 6 hours which is helping.  3. Hypomagnesemia: -Continue magnesium 3 times a day.  Magnesium today is 1.8.  4. Hypertension: -Continue Norvasc 2.5 mg daily.  Lisinopril on hold.  5. CKD: -Baseline creatinine 0.1-1.5.  Today it is improved to 0.95.   Orders placed this encounter:  No orders of the defined types were placed in this encounter.    [name], MD [facility] [phone]   I, Jacqualyn Posey, am acting as a scribe for Dr. Sanda Linger.  I, [name] MD, have reviewed the above documentation for accuracy and completeness, and I agree with the above.

## 2019-12-15 ENCOUNTER — Other Ambulatory Visit (HOSPITAL_COMMUNITY): Payer: Self-pay | Admitting: Hematology

## 2019-12-26 ENCOUNTER — Encounter (HOSPITAL_COMMUNITY): Payer: Self-pay

## 2019-12-26 NOTE — Progress Notes (Signed)
Filled out form regarding patient's first course of treatment for [facility] and mailed back to them per their request.

## 2019-12-29 ENCOUNTER — Other Ambulatory Visit: Payer: Self-pay

## 2019-12-29 ENCOUNTER — Inpatient Hospital Stay (HOSPITAL_COMMUNITY): Payer: Medicare Other | Attending: Hematology

## 2019-12-29 ENCOUNTER — Inpatient Hospital Stay (HOSPITAL_COMMUNITY): Payer: Medicare Other

## 2019-12-29 ENCOUNTER — Inpatient Hospital Stay (HOSPITAL_BASED_OUTPATIENT_CLINIC_OR_DEPARTMENT_OTHER): Payer: Medicare Other | Admitting: Hematology

## 2019-12-29 VITALS — BP 144/83 | HR 76 | Temp 97.1°F | Resp 18 | Wt 230.2 lb

## 2019-12-29 VITALS — BP 138/82 | HR 72 | Temp 98.1°F | Resp 18

## 2019-12-29 DIAGNOSIS — Z5111 Encounter for antineoplastic chemotherapy: Secondary | ICD-10-CM | POA: Insufficient documentation

## 2019-12-29 DIAGNOSIS — C9200 Acute myeloblastic leukemia, not having achieved remission: Secondary | ICD-10-CM

## 2019-12-29 DIAGNOSIS — Z95828 Presence of other vascular implants and grafts: Secondary | ICD-10-CM

## 2019-12-29 DIAGNOSIS — I129 Hypertensive chronic kidney disease with stage 1 through stage 4 chronic kidney disease, or unspecified chronic kidney disease: Secondary | ICD-10-CM | POA: Diagnosis not present

## 2019-12-29 DIAGNOSIS — N183 Chronic kidney disease, stage 3 unspecified: Secondary | ICD-10-CM | POA: Diagnosis not present

## 2019-12-29 DIAGNOSIS — D46Z Other myelodysplastic syndromes: Secondary | ICD-10-CM

## 2019-12-29 DIAGNOSIS — Z79899 Other long term (current) drug therapy: Secondary | ICD-10-CM | POA: Diagnosis not present

## 2019-12-29 LAB — CBC WITH DIFFERENTIAL/PLATELET
Abs Immature Granulocytes: 0.06 10*3/uL (ref 0.00–0.07)
Basophils Absolute: 0 10*3/uL (ref 0.0–0.1)
Basophils Relative: 1 %
Eosinophils Absolute: 0 10*3/uL (ref 0.0–0.5)
Eosinophils Relative: 1 %
HCT: 45 % (ref 39.0–52.0)
Hemoglobin: 14.5 g/dL (ref 13.0–17.0)
Immature Granulocytes: 2 %
Lymphocytes Relative: 32 %
Lymphs Abs: 1 10*3/uL (ref 0.7–4.0)
MCH: 33.1 pg (ref 26.0–34.0)
MCHC: 32.2 g/dL (ref 30.0–36.0)
MCV: 102.7 fL — ABNORMAL HIGH (ref 80.0–100.0)
Monocytes Absolute: 0.4 10*3/uL (ref 0.1–1.0)
Monocytes Relative: 14 %
Neutro Abs: 1.6 10*3/uL — ABNORMAL LOW (ref 1.7–7.7)
Neutrophils Relative %: 50 %
Platelets: 138 10*3/uL — ABNORMAL LOW (ref 150–400)
RBC: 4.38 MIL/uL (ref 4.22–5.81)
RDW: 15.4 % (ref 11.5–15.5)
WBC: 3.1 10*3/uL — ABNORMAL LOW (ref 4.0–10.5)
nRBC: 0 % (ref 0.0–0.2)

## 2019-12-29 LAB — COMPREHENSIVE METABOLIC PANEL
ALT: 31 U/L (ref 0–44)
AST: 23 U/L (ref 15–41)
Albumin: 3.9 g/dL (ref 3.5–5.0)
Alkaline Phosphatase: 63 U/L (ref 38–126)
Anion gap: 9 (ref 5–15)
BUN: 20 mg/dL (ref 8–23)
CO2: 23 mmol/L (ref 22–32)
Calcium: 9.3 mg/dL (ref 8.9–10.3)
Chloride: 106 mmol/L (ref 98–111)
Creatinine, Ser: 0.96 mg/dL (ref 0.61–1.24)
GFR calc Af Amer: >60 mL/min (ref >60)
GFR calc non Af Amer: >60 mL/min (ref >60)
Glucose, Bld: 116 mg/dL — ABNORMAL HIGH (ref 70–99)
Potassium: 4.1 mmol/L (ref 3.5–5.1)
Sodium: 138 mmol/L (ref 135–145)
Total Bilirubin: 0.7 mg/dL (ref 0.3–1.2)
Total Protein: 6.5 g/dL (ref 6.5–8.1)

## 2019-12-29 LAB — LACTATE DEHYDROGENASE: LDH: 159 U/L (ref 98–192)

## 2019-12-29 LAB — MAGNESIUM: Magnesium: 1.7 mg/dL (ref 1.7–2.4)

## 2019-12-29 MED ORDER — SODIUM CHLORIDE 0.9 % IV SOLN
8.0000 mg | Freq: Once | INTRAVENOUS | Status: AC
  Administered 2019-12-29: 8 mg via INTRAVENOUS
  Filled 2019-12-29: qty 4

## 2019-12-29 MED ORDER — SODIUM CHLORIDE 0.9% FLUSH
10.0000 mL | INTRAVENOUS | Status: DC | PRN
  Administered 2019-12-29: 10 mL

## 2019-12-29 MED ORDER — SODIUM CHLORIDE 0.9 % IV SOLN: Freq: Once | INTRAVENOUS | Status: AC

## 2019-12-29 MED ORDER — SODIUM CHLORIDE 0.9 % IV SOLN
15.0000 mg/m2 | Freq: Once | INTRAVENOUS | Status: AC
  Administered 2019-12-29: 35 mg via INTRAVENOUS
  Filled 2019-12-29: qty 7

## 2019-12-29 MED ORDER — HEPARIN SOD (PORK) LOCK FLUSH 100 UNIT/ML IV SOLN
500.0000 [IU] | Freq: Once | INTRAVENOUS | Status: AC | PRN
  Administered 2019-12-29: 500 [IU]

## 2019-12-29 NOTE — Progress Notes (Signed)
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[place])   08/22/2018 - 12/31/2018 Chemotherapy   The patient had palonosetron (ALOXI) injection 0.25 mg, 0.25 mg, Intravenous,  Once, 4 of 6 cycles Administration: 0.25 mg (08/22/2018), 0.25 mg (08/26/2018), 0.25 mg (08/28/2018), 0.25 mg (08/30/2018), 0.25 mg (10/01/2018), 0.25 mg (10/02/2018), 0.25 mg (11/04/2018), 0.25 mg (09/23/2018), 0.25 mg (09/25/2018), 0.25 mg (09/27/2018), 0.25 mg (10/28/2018), 0.25 mg (10/30/2018), 0.25 mg (11/01/2018), 0.25 mg (12/02/2018), 0.25 mg (12/04/2018), 0.25 mg (12/06/2018) azaCITIDine (VIDAZA) 100 mg in sodium chloride 0.9 % 50 mL chemo infusion, 110 mg (66.7 % of original dose 75 mg/m2), Intravenous, Once, 4 of 6 cycles Dose modification: 50 mg/m2 (original dose 75 mg/m2, Cycle 1, Reason: Provider Judgment), 50 mg/m2 (original dose 75 mg/m2, Cycle 2, Reason: Provider Judgment) Administration: 100 mg (08/22/2018), 100 mg (08/23/2018), 100 mg (08/26/2018), 100 mg (08/27/2018), 100 mg (08/28/2018), 100 mg (08/29/2018), 100 mg (08/30/2018), 100 mg (10/01/2018), 100 mg (10/02/2018), 100 mg (11/04/2018), 100 mg (11/05/2018), 100 mg (09/23/2018), 100 mg (09/24/2018), 100 mg (09/25/2018), 100 mg (09/26/2018), 100 mg (09/27/2018), 100 mg (10/28/2018), 100 mg (10/29/2018), 100 mg (10/30/2018), 100 mg (10/31/2018), 100 mg (11/01/2018), 100 mg (12/02/2018), 100 mg (12/03/2018), 100 mg (12/04/2018), 100 mg (12/05/2018), 100 mg (12/06/2018)  for chemotherapy treatment.    01/20/2019 -  Chemotherapy   The patient had  decitabine (DACOGEN) 45 mg in sodium chloride 0.9 % 50 mL chemo infusion, 20 mg/m2 = 45 mg, Intravenous,  Once, 8 of 10 cycles Dose modification: 15 mg/m2 (original dose 20 mg/m2, Cycle 2, Reason: Other (see comments), Comment: neutropenic fever) Administration: 45 mg (01/20/2019), 45 mg (01/21/2019), 45 mg (01/22/2019), 45 mg (01/23/2019), 45 mg (01/24/2019), 35 mg (03/17/2019), 35 mg (03/18/2019), 35 mg (03/19/2019), 35 mg (03/20/2019), 35 mg (03/21/2019), 35 mg (04/22/2019), 35 mg (04/23/2019), 35 mg (04/24/2019), 35 mg (04/25/2019), 35 mg (05/26/2019), 35 mg (05/27/2019), 35 mg (05/28/2019), 35 mg (05/29/2019), 35 mg (05/30/2019), 35 mg (07/07/2019), 35 mg (07/08/2019), 35 mg (07/09/2019), 35 mg (07/10/2019), 35 mg (07/11/2019), 35 mg (08/18/2019), 35 mg (08/19/2019), 35 mg (08/20/2019), 35 mg (08/21/2019), 35 mg (08/22/2019), 35 mg (09/29/2019), 35 mg (09/30/2019), 35 mg (10/01/2019), 35 mg (10/02/2019), 35 mg (10/03/2019), 35 mg (11/17/2019), 35 mg (11/18/2019), 35 mg (11/19/2019), 35 mg (11/20/2019), 35 mg (11/21/2019) ondansetron (ZOFRAN) 8 mg in sodium chloride 0.9 % 50 mL IVPB, 8 mg (100 % of original dose 8 mg), Intravenous,  Once, 8 of 10 cycles Dose modification: 8 mg (original dose 8 mg, Cycle 1) Administration: 8 mg (01/20/2019), 8 mg (01/21/2019), 8 mg (01/22/2019), 8 mg (01/23/2019), 8 mg (01/24/2019), 8 mg (03/17/2019), 8 mg (03/18/2019), 8 mg (03/19/2019), 8 mg (03/20/2019), 8 mg (03/21/2019), 8 mg (04/22/2019), 8 mg (04/23/2019), 8 mg (04/24/2019), 8 mg (04/25/2019), 8 mg (05/26/2019), 8 mg (05/27/2019), 8 mg (05/28/2019), 8 mg (05/29/2019), 8 mg (05/30/2019), 8 mg (07/07/2019), 8 mg (07/08/2019), 8 mg (07/09/2019), 8 mg (07/10/2019), 8 mg (07/11/2019), 8 mg (08/18/2019), 8 mg (08/19/2019), 8 mg (08/20/2019), 8 mg (08/21/2019), 8 mg (08/22/2019), 8 mg (09/29/2019), 8 mg (09/30/2019),  8 mg (10/01/2019), 8 mg (10/02/2019), 8 mg (10/03/2019), 8 mg (11/17/2019), 8 mg (11/18/2019), 8 mg (11/19/2019), 8 mg (11/20/2019), 8 mg (11/21/2019)  for chemotherapy treatment.      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[facility])  01/07/2019 Initial Diagnosis   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[place])   01/20/2019 -  Chemotherapy   The patient had decitabine (DACOGEN) 45 mg in sodium chloride 0.9 % 50 mL chemo infusion, 20 mg/m2 = 45 mg, Intravenous,  Once, 8 of 10 cycles Dose modification: 15 mg/m2 (original dose 20 mg/m2, Cycle 2, Reason: Other (see comments), Comment: neutropenic fever) Administration: 45 mg (01/20/2019), 45 mg (01/21/2019), 45 mg (01/22/2019), 45 mg (01/23/2019), 45 mg (01/24/2019), 35 mg (03/17/2019), 35 mg (03/18/2019), 35 mg (03/19/2019), 35 mg (03/20/2019), 35 mg (03/21/2019), 35 mg (04/22/2019), 35 mg (04/23/2019), 35 mg (04/24/2019), 35 mg (04/25/2019), 35 mg (05/26/2019), 35 mg (05/27/2019), 35 mg (05/28/2019), 35 mg (05/29/2019), 35 mg (05/30/2019), 35 mg (07/07/2019), 35 mg (07/08/2019), 35 mg (07/09/2019), 35 mg (07/10/2019), 35 mg (07/11/2019), 35 mg (08/18/2019), 35 mg (08/19/2019), 35 mg (08/20/2019), 35 mg (08/21/2019), 35 mg (08/22/2019), 35 mg (09/29/2019), 35 mg (09/30/2019), 35 mg (10/01/2019), 35 mg (10/02/2019), 35 mg (10/03/2019), 35 mg (11/17/2019), 35 mg (11/18/2019), 35 mg (11/19/2019), 35 mg (11/20/2019), 35 mg (11/21/2019) ondansetron (ZOFRAN) 8 mg in sodium chloride 0.9 % 50 mL IVPB, 8 mg (100 % of original dose 8 mg), Intravenous,  Once, 8 of 10 cycles Dose modification: 8 mg (original dose 8 mg, Cycle 1) Administration: 8 mg (01/20/2019), 8 mg (01/21/2019), 8 mg (01/22/2019), 8 mg (01/23/2019), 8 mg (01/24/2019), 8 mg (03/17/2019), 8 mg (03/18/2019), 8 mg (03/19/2019), 8 mg (03/20/2019), 8 mg (03/21/2019), 8 mg (04/22/2019), 8 mg (04/23/2019), 8 mg (04/24/2019), 8 mg (04/25/2019), 8 mg (05/26/2019), 8 mg (05/27/2019), 8 mg (05/28/2019), 8 mg (05/29/2019), 8 mg (05/30/2019), 8 mg (07/07/2019), 8 mg (07/08/2019), 8 mg (07/09/2019), 8 mg (07/10/2019), 8 mg (07/11/2019), 8 mg (08/18/2019), 8 mg (08/19/2019), 8 mg (08/20/2019), 8 mg (08/21/2019), 8 mg (08/22/2019), 8 mg (09/29/2019), 8 mg (09/30/2019), 8 mg  (10/01/2019), 8 mg (10/02/2019), 8 mg (10/03/2019), 8 mg (11/17/2019), 8 mg (11/18/2019), 8 mg (11/19/2019), 8 mg (11/20/2019), 8 mg (11/21/2019)  for chemotherapy treatment.      CANCER STAGING: Cancer Staging No matching staging information was found for the patient.  INTERVAL HISTORY:  [name], a 74 y.o. male, returns for routine follow-up and consideration for next cycle of chemotherapy. Jyair was last seen on 12/02/2019.  Due for cycle #9 of decitabine today.   Overall, he tells me he has been feeling pretty well. He denies having any issues since his last treatment. His leg pains are stable and his leg swelling is stable as well. He has his bottle of venetoclax but he has not started it yet.  Overall, he feels ready for next cycle of chemo today.    REVIEW OF SYSTEMS:  Review of Systems  Constitutional: Positive for fatigue (severe). Negative for appetite change.  Cardiovascular: Positive for leg swelling (stable).  All other systems reviewed and are negative.   PAST MEDICAL/SURGICAL HISTORY:  Past Medical History:  Diagnosis Date  . Arthritis   . 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 Cancer ([place]) 01-28-2014   skin cancer  . CKD (chronic kidney disease), stage III   . GERD (gastroesophageal reflux disease)   . Heart murmur    NOTED DURING PHYSICAL WHEN HE WAS ENLISTING IN [facility] , DIDNT KNOW  UNTIL THAT TIME AND REPORTS , "THATS THE LAST I HEARD ABOUT IT "   . History of hypertension    no longer issue  . History of kidney stones   . History of malignant melanoma of skin    excision top of scalp 2015-- no recurrence  . History of urinary retention    post op lumbar fusion surgery 04/ 2016  . Hypertension   . 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 Left ureteral calculus   . Seasonal allergies   . Wears glasses   . Wears glasses   . Wears partial dentures    upper and lower    Past Surgical History:  Procedure Laterality Date  . ANKLE FUSION Right 2007  . CARPAL TUNNEL RELEASE Left 12/28/2009   w/ pulley release left long finger  . CARPAL TUNNEL RELEASE Right 07/22/2013   Procedure: RIGHT CARPAL TUNNEL RELEASE;  Surgeon: Cammie Sickle., MD;  Location: [facility];  Service: Orthopedics;  Laterality: Right;  . COLONOSCOPY    . CYSTO/  LEFT RETROGRADE PYELOGRAM  11/21/2010  . CYSTOSCOPY WITH STENT PLACEMENT Left 03/09/2016   Procedure: CYSTOSCOPY WITH STENT PLACEMENT;  Surgeon: Franchot Gallo, MD;  Location[facility];  Service: Urology;  Laterality: Left;  . CYSTOSCOPY/RETROGRADE/URETEROSCOPY/STONE EXTRACTION WITH BASKET Left 03/09/2016   Procedure: CYSTOSCOPY/RETROGRADE/URETEROSCOPY/STONE EXTRACTION WITH BASKET;  Surgeon: Franchot Gallo, MD;  Location: [facility];  Service: Urology;  Laterality: Left;  . LEFT URETEROSCOPIC LASER LITHOTRIPSY STONE EXTRACTION/ STENT PLACEMENT  05/23/2010  . MOHS SURGERY     TOP OF THE HEAD   . ORIF ANKLE FRACTURE Right 1978  . PORT-A-CATH REMOVAL Right 02/14/2019   Procedure: MINOR REMOVAL PORT-A-CATH;  Surgeon: Aviva Signs, MD;  Location: [facility];  Service: General;  Laterality: Right;  . PORTACATH PLACEMENT Right 08/19/2018   Procedure: INSERTION PORT-A-CATH (attached catheter in right subclavian);  Surgeon: Aviva Signs, MD;  Location: [facility];  Service: General;  Laterality: Right;  . PORTACATH PLACEMENT Left 05/16/2019   Procedure: INSERTION PORT-A-CATH (attached catheter in left subclavian);  Surgeon: Aviva Signs, MD;  Location: [facility];  Service: General;  Laterality: Left;  . POSTERIOR LUMBAR FUSION  08/21/2014   laminectomy and decompression L2 -- L5  . RIGHT LOWER LEG SURGERY  X3  1975 to 1976   including ORIF  . TONSILLECTOMY AND ADENOIDECTOMY  1986  . UMBILICAL HERNIA REPAIR  2009 approx    SOCIAL HISTORY:  Social History   Socioeconomic History  .  Marital status: Married    Spouse name: Not on file  . Number of children: Not on file  . Years of education: Not on file  . Highest education level: Not on file  Occupational History  . Not on file  Tobacco Use  . Smoking status: Former Smoker    Years: 20.00    Types: Cigarettes    Quit date: 07/17/1986    Years since quitting: 33.4  . Smokeless tobacco: Never Used  Vaping Use  . Vaping Use: Never used  Substance and Sexual Activity  . Alcohol use: Yes    Alcohol/week: 7.0 - 14.0 standard drinks    Types: 7 - 14 Cans of beer per week    Comment: 1 -2 beer daily  . Drug use: No  . Sexual activity: Not Currently  Other Topics Concern  . Not on file  Social History Narrative  .  Not on file   Social Determinants of Health   Financial Resource Strain: Unknown  . Difficulty of Paying Living Expenses: Patient refused  Food Insecurity: Unknown  . Worried About Charity fundraiser in the Last Year: Patient refused  . Ran Out of Food in the Last Year: Patient refused  Transportation Needs: Unknown  . Lack of Transportation (Medical): Patient refused  . Lack of Transportation (Non-Medical): Patient refused  Physical Activity: Unknown  . Days of Exercise per Week: Patient refused  . Minutes of Exercise per Session: Patient refused  Stress: Unknown  . Feeling of Stress : Patient refused  Social Connections: Unknown  . Frequency of Communication with Friends and Family: Patient refused  . Frequency of Social Gatherings with Friends and Family: Patient refused  . Attends Religious Services: Patient refused  . Active Member of Clubs or Organizations: Patient refused  . Attends Archivist Meetings: Patient refused  . Marital Status: Patient refused  Intimate Partner Violence: Unknown  . Fear of Current or Ex-Partner: Patient refused  . Emotionally Abused: Patient refused  . Physically Abused: Patient refused  . Sexually Abused: Patient refused    FAMILY HISTORY:   Family History  Problem Relation Age of Onset  . Stroke Mother   . Prostate cancer Father   . Bone cancer Father   . Diverticulitis Father   . Rheum arthritis Sister   . Urinary tract infection Sister   . Colon cancer Neg Hx     CURRENT MEDICATIONS:  Current Outpatient Medications  Medication Sig Dispense Refill  . acetaminophen (TYLENOL) 500 MG tablet Take 1,000 mg by mouth 3 (three) times daily.     Marland Kitchen allopurinol (ZYLOPRIM) 300 MG tablet Take 300 mg by mouth at bedtime.     Marland Kitchen amLODipine (NORVASC) 2.5 MG tablet Take 1 tablet (2.5 mg total) by mouth daily. 90 tablet 2  . cetirizine (ZYRTEC) 10 MG tablet Take 10 mg by mouth daily. IN THE MORNING    . Cholecalciferol (VITAMIN D3) 1.25 MG (50000 UT) CAPS Take 1 capsule by mouth daily.    . diclofenac sodium (VOLTAREN) 1 % GEL Apply 1 g topically 3 (three) times daily as needed (knee pain.).     Marland Kitchen docusate sodium (COLACE) 100 MG capsule Take 100 mg by mouth at bedtime.     . lansoprazole (PREVACID) 15 MG capsule Take 15 mg by mouth daily.     Marland Kitchen lidocaine-prilocaine (EMLA) cream Apply 1 application topically See admin instructions. ONE HOUR PRIOR TO CHEMOTHERAPY APPOINTMENT    . magnesium oxide (MAG-OX) 400 (241.3 Mg) MG tablet TAKE 1 TABLET BY MOUTH THREE TIMES DAILY 90 tablet 3  . tamsulosin (FLOMAX) 0.4 MG CAPS capsule Take 0.4 mg by mouth every evening.     . traMADol (ULTRAM) 50 MG tablet TAKE 1 TABLET BY MOUTH EVERY 6 HOURS AS NEEDED FOR PAIN 120 tablet 0  . VENCLEXTA 100 MG TABS TAKE 2 TABLETS (200 MG) BY MOUTH DAILY 60 tablet 0   No current facility-administered medications for this visit.   Facility-Administered Medications Ordered in Other Visits  Medication Dose Route Frequency Provider Last Rate Last Admin  . sodium chloride flush (NS) 0.9 % injection 10 mL  10 mL Intracatheter PRN [name], MD   10 mL at 11/21/19 1030  . sodium chloride flush (NS) 0.9 % injection 20 mL  20 mL Intravenous PRN [name], MD   20 mL at 07/21/19 1053    ALLERGIES:  No Known Allergies  PHYSICAL EXAM:  Performance status (ECOG): 1 - Symptomatic but completely ambulatory  Vitals:   12/29/19 1027  BP: (!) 144/83  Pulse: 76  Resp: 18  Temp: (!) 97.1 F (36.2 C)  SpO2: 97%   Wt Readings from Last 3 Encounters:  12/29/19 230 lb 3.2 oz (104.4 kg)  12/02/19 (!) 227 lb 8 oz (103.2 kg)  11/17/19 227 lb 4.8 oz (103.1 kg)   Physical Exam Vitals reviewed.  Constitutional:      Appearance: Normal appearance. He is obese.  Cardiovascular:     Rate and Rhythm: Normal rate and regular rhythm.     Pulses: Normal pulses.     Heart sounds: Normal heart sounds.  Pulmonary:     Effort: Pulmonary effort is normal.     Breath sounds: Normal breath sounds.  Chest:     Comments: Port-a-Cath in L chest Abdominal:     Palpations: Abdomen is soft. There is no mass.     Tenderness: There is no abdominal tenderness.  Musculoskeletal:     Right lower leg: Edema (1+) present.     Left lower leg: Edema (1+) present.  Neurological:     General: No focal deficit present.     Mental Status: He is alert and oriented to person, place, and time.  Psychiatric:        Mood and Affect: Mood normal.        Behavior: Behavior normal.     LABORATORY DATA:  I have reviewed the labs as listed.  CBC Latest Ref Rng & Units 12/29/2019 12/02/2019 11/17/2019  WBC 4.0 - 10.5 K/uL 3.1(L) 2.2(L) 4.5  Hemoglobin 13.0 - 17.0 g/dL 14.5 14.1 14.5  Hematocrit 39 - 52 % 45.0 42.9 44.1  Platelets 150 - 400 K/uL 138(L) 106(L) 186   CMP Latest Ref Rng & Units 12/29/2019 12/02/2019 11/17/2019  Glucose 70 - 99 mg/dL 116(H) 109(H) 118(H)  BUN 8 - 23 mg/dL 20 22 23   Creatinine 0.61 - 1.24 mg/dL 0.96 0.95 1.00  Sodium 135 - 145 mmol/L 138 141 139  Potassium 3.5 - 5.1 mmol/L 4.1 4.3 4.5  Chloride 98 - 111 mmol/L 106 111 107  CO2 22 - 32 mmol/L 23 21(L) 21(L)  Calcium 8.9 - 10.3 mg/dL 9.3 9.3 9.4  Total Protein 6.5 - 8.1 g/dL 6.5  6.4(L) 6.4(L)  Total Bilirubin 0.3 - 1.2 mg/dL 0.7 0.8 0.7  Alkaline Phos 38 - 126 U/L 63 66 65  AST 15 - 41 U/L 23 27 21   ALT 0 - 44 U/L 31 36 25    DIAGNOSTIC IMAGING:  I have independently reviewed the scans and discussed with the patient. No results found.   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  -BMBX on 02/25/2019 after cycle 1 did not show any evidence of leukemia. -CT CAP on 02/12/2019 shows numerous bilateral irregular/spiculated pulmonary nodules measuring up to 14 mm, nonspecific. Hepatic steatosis. Spleen is normal.   PLAN:  1. AML: -He tolerated last cycle very well. -We reviewed labs today.  LFTs are normal.  White count is 3.1 with ANC normal.  Platelets are 138. -He will proceed with his next cycle of decitabine at 15 mg per metered square.  He will start venetoclax tonight at 200 mg daily for 2 weeks.  He will stop it after 2 weeks. -I plan to see him back in 3  weeks with repeat labs.  2. Arthralgias: -Continue tramadol every 6 hours which is helping.  3. Hypomagnesemia: -Magnesium today is 1.7.  Continue magnesium 3 times a day.  4. Hypertension: -Continue Norvasc 2.5 mg daily.  Lisinopril on hold.  5. CKD: -Creatinine improved to 0.96 since lisinopril on hold.   Orders placed this encounter:  No orders of the defined types were placed in this encounter.    [name], MD [place] [phone]   I, Milinda Antis, am acting as a scribe for Dr. Sanda Linger.  I, [name] MD, have reviewed the above documentation for accuracy and completeness, and I agree with the above.

## 2019-12-29 NOTE — Progress Notes (Signed)
Message received from [place] RN/ Dr. Delton Coombes proceed with treatment. Labs reviewed.   Patient presents today for treatment and follow up with Dr. Delton Coombes. Patient has no complaints of any changes today since his last visit. Patient denies pain today.

## 2019-12-29 NOTE — Patient Instructions (Signed)
[place] at [facility] Discharge Instructions  You were seen today by Dr. Delton Coombes. He went over your recent results. You received your treatment today; continue your daily treatments and start taking the venetoclax tonight. Take the venetoclax only for 2 weeks. Dr. Delton Coombes will see you back in 3 weeks for labs and follow up.   Thank you for choosing [place] at [facility] to provide your oncology and hematology care.  To afford each patient quality time with our provider, please arrive at least 15 minutes before your scheduled appointment time.   If you have a lab appointment with the [facility] please come in thru the Main Entrance and check in at the main information desk  You need to re-schedule your appointment should you arrive 10 or more minutes late.  We strive to give you quality time with our providers, and arriving late affects you and other patients whose appointments are after yours.  Also, if you no show three or more times for appointments you may be dismissed from the clinic at the providers discretion.     Again, thank you for choosing [facility].  Our hope is that these requests will decrease the amount of time that you wait before being seen by our physicians.       _____________________________________________________________  Should you have questions after your visit to [facility], please contact our office at [phone] between the hours of 8:00 a.m. and 4:30 p.m.  Voicemails left after 4:00 p.m. will not be returned until the following business day.  For prescription refill requests, have your pharmacy contact our office and allow 72 hours.    Cancer Center Support Programs:   > Cancer Support Group  2nd Tuesday of the month 1pm-2pm, Journey Room

## 2019-12-29 NOTE — Progress Notes (Signed)
Treatment given today per MD orders. Tolerated infusion without adverse affects. Vital signs stable. No complaints at this time. Discharged from clinic ambulatory. F/U with [facility] as scheduled.   

## 2019-12-29 NOTE — Patient Instructions (Signed)
[facility] Discharge Instructions for Patients Receiving Chemotherapy  Today you received the following chemotherapy agents   To help prevent nausea and vomiting after your treatment, we encourage you to take your nausea medication   If you develop nausea and vomiting that is not controlled by your nausea medication, call the clinic.   BELOW ARE SYMPTOMS THAT SHOULD BE REPORTED IMMEDIATELY:  *FEVER GREATER THAN 100.5 F  *CHILLS WITH OR WITHOUT FEVER  NAUSEA AND VOMITING THAT IS NOT CONTROLLED WITH YOUR NAUSEA MEDICATION  *UNUSUAL SHORTNESS OF BREATH  *UNUSUAL BRUISING OR BLEEDING  TENDERNESS IN MOUTH AND THROAT WITH OR WITHOUT PRESENCE OF ULCERS  *URINARY PROBLEMS  *BOWEL PROBLEMS  UNUSUAL RASH Items with * indicate a potential emergency and should be followed up as soon as possible.  Feel free to call the clinic should you have any questions or concerns. The clinic phone number is [phone].  Please show the CHEMO ALERT CARD at check-in to the Emergency Department and triage nurse.   

## 2019-12-29 NOTE — Progress Notes (Signed)
Patient has been assessed by Dr.Katragdda and labs reviewed. He is okay to proceed with treatment today. Primary RN and pharmacy aware.

## 2019-12-30 ENCOUNTER — Ambulatory Visit (HOSPITAL_COMMUNITY): Payer: Medicare Other

## 2019-12-30 ENCOUNTER — Inpatient Hospital Stay (HOSPITAL_COMMUNITY): Payer: Medicare Other

## 2019-12-30 VITALS — BP 143/82 | HR 71 | Temp 97.3°F | Resp 18

## 2019-12-30 DIAGNOSIS — C9200 Acute myeloblastic leukemia, not having achieved remission: Secondary | ICD-10-CM

## 2019-12-30 DIAGNOSIS — D46Z Other myelodysplastic syndromes: Secondary | ICD-10-CM

## 2019-12-30 DIAGNOSIS — Z5111 Encounter for antineoplastic chemotherapy: Secondary | ICD-10-CM | POA: Diagnosis not present

## 2019-12-30 DIAGNOSIS — Z95828 Presence of other vascular implants and grafts: Secondary | ICD-10-CM

## 2019-12-30 MED ORDER — SODIUM CHLORIDE 0.9 % IV SOLN: Freq: Once | INTRAVENOUS | Status: AC

## 2019-12-30 MED ORDER — SODIUM CHLORIDE 0.9 % IV SOLN
8.0000 mg | Freq: Once | INTRAVENOUS | Status: AC
  Administered 2019-12-30: 8 mg via INTRAVENOUS
  Filled 2019-12-30: qty 4

## 2019-12-30 MED ORDER — SODIUM CHLORIDE 0.9 % IV SOLN
15.0000 mg/m2 | Freq: Once | INTRAVENOUS | Status: AC
  Administered 2019-12-30: 35 mg via INTRAVENOUS
  Filled 2019-12-30: qty 7

## 2019-12-30 MED ORDER — SODIUM CHLORIDE 0.9% FLUSH
10.0000 mL | INTRAVENOUS | Status: DC | PRN
  Administered 2019-12-30: 10 mL

## 2019-12-30 MED ORDER — HEPARIN SOD (PORK) LOCK FLUSH 100 UNIT/ML IV SOLN
500.0000 [IU] | Freq: Once | INTRAVENOUS | Status: AC | PRN
  Administered 2019-12-30: 500 [IU]

## 2019-12-30 NOTE — Progress Notes (Signed)
Richard Wallace presents today for D2C9 Decitabine . Pt denies any new changes or symptoms over night. Lab results from yesterday reviewed. Vitals have been reviewed and are stable and within parameters for treatment. Per parameters and MD note from yesterday, ok to proceed with treatment today as planned.  Infusions tolerated without incident or complaint. VSS upon completion of treatment. Port flushed and left accessed for treatment tomorrow, see MAR and IV flowsheet for details. Discharged in satisfactory condition with follow up instructions.

## 2019-12-30 NOTE — Patient Instructions (Signed)
[facility] Discharge Instructions for Patients Receiving Chemotherapy   Beginning January 23rd 2017 lab work for the [facility] will be done in the  Main lab at [facility] on 1st floor. If you have a lab appointment with the [facility] please come in thru the  Main Entrance and check in at the main information desk   Today you received the following chemotherapy agents Decitabine  To help prevent nausea and vomiting after your treatment, we encourage you to take your nausea medication   If you develop nausea and vomiting, or diarrhea that is not controlled by your medication, call the clinic.  The clinic phone number is [phone]. Office hours are Monday-Friday 8:30am-5:00pm.  BELOW ARE SYMPTOMS THAT SHOULD BE REPORTED IMMEDIATELY:  *FEVER GREATER THAN 101.0 F  *CHILLS WITH OR WITHOUT FEVER  NAUSEA AND VOMITING THAT IS NOT CONTROLLED WITH YOUR NAUSEA MEDICATION  *UNUSUAL SHORTNESS OF BREATH  *UNUSUAL BRUISING OR BLEEDING  TENDERNESS IN MOUTH AND THROAT WITH OR WITHOUT PRESENCE OF ULCERS  *URINARY PROBLEMS  *BOWEL PROBLEMS  UNUSUAL RASH Items with * indicate a potential emergency and should be followed up as soon as possible. If you have an emergency after office hours please contact your primary care physician or go to the nearest emergency department.  Please call the clinic during office hours if you have any questions or concerns.   You may also contact the Patient Navigator at [phone] should you have any questions or need assistance in obtaining follow up care.      Resources For Cancer Patients and their Caregivers ? American Cancer Society: Can assist with transportation, wigs, general needs, runs Look Good Feel Better.        817-651-4414 ? Cancer Care: Provides financial assistance, online support groups, medication/co-pay assistance.  1-800-813-HOPE 670-313-4790) ? [place] Assists [place]  cancer patients and their families through emotional , educational and financial support.  (213) 803-9762 ? [place] DSS Where to apply for food stamps, Medicaid and utility assistance. 410-575-6646 ? [facility]: Transportation to medical appointments. 9794896709 ? Social Security Administration: May apply for disability if have a Stage IV cancer. 724-288-3776 (667)839-4122 ? [employer], Disability and Transit Services: Assists with nutrition, care and transit needs. 323-301-5729

## 2019-12-31 ENCOUNTER — Other Ambulatory Visit: Payer: Self-pay

## 2019-12-31 ENCOUNTER — Inpatient Hospital Stay (HOSPITAL_COMMUNITY): Payer: Medicare Other | Attending: Hematology

## 2019-12-31 ENCOUNTER — Encounter (HOSPITAL_COMMUNITY): Payer: Self-pay

## 2019-12-31 VITALS — BP 126/77 | HR 64 | Temp 96.7°F | Resp 18

## 2019-12-31 DIAGNOSIS — Z79899 Other long term (current) drug therapy: Secondary | ICD-10-CM | POA: Diagnosis not present

## 2019-12-31 DIAGNOSIS — C9200 Acute myeloblastic leukemia, not having achieved remission: Secondary | ICD-10-CM | POA: Diagnosis present

## 2019-12-31 DIAGNOSIS — N183 Chronic kidney disease, stage 3 unspecified: Secondary | ICD-10-CM | POA: Insufficient documentation

## 2019-12-31 DIAGNOSIS — I129 Hypertensive chronic kidney disease with stage 1 through stage 4 chronic kidney disease, or unspecified chronic kidney disease: Secondary | ICD-10-CM | POA: Diagnosis not present

## 2019-12-31 DIAGNOSIS — D46Z Other myelodysplastic syndromes: Secondary | ICD-10-CM

## 2019-12-31 DIAGNOSIS — Z5111 Encounter for antineoplastic chemotherapy: Secondary | ICD-10-CM | POA: Diagnosis present

## 2019-12-31 DIAGNOSIS — Z95828 Presence of other vascular implants and grafts: Secondary | ICD-10-CM

## 2019-12-31 MED ORDER — SODIUM CHLORIDE 0.9 % IV SOLN
8.0000 mg | Freq: Once | INTRAVENOUS | Status: AC
  Administered 2019-12-31: 8 mg via INTRAVENOUS
  Filled 2019-12-31: qty 4

## 2019-12-31 MED ORDER — SODIUM CHLORIDE 0.9 % IV SOLN: Freq: Once | INTRAVENOUS | Status: AC

## 2019-12-31 MED ORDER — SODIUM CHLORIDE 0.9 % IV SOLN
15.0000 mg/m2 | Freq: Once | INTRAVENOUS | Status: AC
  Administered 2019-12-31: 35 mg via INTRAVENOUS
  Filled 2019-12-31: qty 7

## 2019-12-31 MED ORDER — HEPARIN SOD (PORK) LOCK FLUSH 100 UNIT/ML IV SOLN
500.0000 [IU] | Freq: Once | INTRAVENOUS | Status: AC | PRN
  Administered 2019-12-31: 500 [IU]

## 2019-12-31 MED ORDER — SODIUM CHLORIDE 0.9% FLUSH
10.0000 mL | INTRAVENOUS | Status: DC | PRN
  Administered 2019-12-31: 10 mL

## 2019-12-31 NOTE — Progress Notes (Signed)
Pt here for day 3 treatment.  VS stable for treatment.   Patient is taking venclexta and has not missed any doses and reports no side effects at this time.   Richard Wallace tolerated treatment well today.  Discharged ambulatory.  Vital signs stable prior to discharge.

## 2019-12-31 NOTE — Patient Instructions (Signed)
[facility] Discharge Instructions for Patients Receiving Chemotherapy  Today you received the following chemotherapy agents   To help prevent nausea and vomiting after your treatment, we encourage you to take your nausea medication   If you develop nausea and vomiting that is not controlled by your nausea medication, call the clinic.   BELOW ARE SYMPTOMS THAT SHOULD BE REPORTED IMMEDIATELY:  *FEVER GREATER THAN 100.5 F  *CHILLS WITH OR WITHOUT FEVER  NAUSEA AND VOMITING THAT IS NOT CONTROLLED WITH YOUR NAUSEA MEDICATION  *UNUSUAL SHORTNESS OF BREATH  *UNUSUAL BRUISING OR BLEEDING  TENDERNESS IN MOUTH AND THROAT WITH OR WITHOUT PRESENCE OF ULCERS  *URINARY PROBLEMS  *BOWEL PROBLEMS  UNUSUAL RASH Items with * indicate a potential emergency and should be followed up as soon as possible.  Feel free to call the clinic should you have any questions or concerns. The clinic phone number is [phone].  Please show the CHEMO ALERT CARD at check-in to the Emergency Department and triage nurse.   

## 2020-01-01 ENCOUNTER — Encounter (HOSPITAL_COMMUNITY): Payer: Self-pay

## 2020-01-01 ENCOUNTER — Inpatient Hospital Stay (HOSPITAL_COMMUNITY): Payer: Medicare Other

## 2020-01-01 VITALS — BP 142/88 | HR 67 | Temp 97.2°F | Resp 18

## 2020-01-01 DIAGNOSIS — Z95828 Presence of other vascular implants and grafts: Secondary | ICD-10-CM

## 2020-01-01 DIAGNOSIS — D46Z Other myelodysplastic syndromes: Secondary | ICD-10-CM

## 2020-01-01 DIAGNOSIS — C9200 Acute myeloblastic leukemia, not having achieved remission: Secondary | ICD-10-CM

## 2020-01-01 DIAGNOSIS — Z5111 Encounter for antineoplastic chemotherapy: Secondary | ICD-10-CM | POA: Diagnosis not present

## 2020-01-01 MED ORDER — SODIUM CHLORIDE 0.9% FLUSH
10.0000 mL | INTRAVENOUS | Status: DC | PRN
  Administered 2020-01-01: 10 mL

## 2020-01-01 MED ORDER — HEPARIN SOD (PORK) LOCK FLUSH 100 UNIT/ML IV SOLN
500.0000 [IU] | Freq: Once | INTRAVENOUS | Status: AC | PRN
  Administered 2020-01-01: 500 [IU]

## 2020-01-01 MED ORDER — SODIUM CHLORIDE 0.9 % IV SOLN: Freq: Once | INTRAVENOUS | Status: AC

## 2020-01-01 MED ORDER — SODIUM CHLORIDE 0.9 % IV SOLN
8.0000 mg | Freq: Once | INTRAVENOUS | Status: AC
  Administered 2020-01-01: 8 mg via INTRAVENOUS
  Filled 2020-01-01: qty 4

## 2020-01-01 MED ORDER — SODIUM CHLORIDE 0.9 % IV SOLN
15.0000 mg/m2 | Freq: Once | INTRAVENOUS | Status: AC
  Administered 2020-01-01: 35 mg via INTRAVENOUS
  Filled 2020-01-01: qty 7

## 2020-01-01 NOTE — Patient Instructions (Signed)
[facility] Discharge Instructions for Patients Receiving Chemotherapy  Today you received the following chemotherapy agents   To help prevent nausea and vomiting after your treatment, we encourage you to take your nausea medication   If you develop nausea and vomiting that is not controlled by your nausea medication, call the clinic.   BELOW ARE SYMPTOMS THAT SHOULD BE REPORTED IMMEDIATELY:  *FEVER GREATER THAN 100.5 F  *CHILLS WITH OR WITHOUT FEVER  NAUSEA AND VOMITING THAT IS NOT CONTROLLED WITH YOUR NAUSEA MEDICATION  *UNUSUAL SHORTNESS OF BREATH  *UNUSUAL BRUISING OR BLEEDING  TENDERNESS IN MOUTH AND THROAT WITH OR WITHOUT PRESENCE OF ULCERS  *URINARY PROBLEMS  *BOWEL PROBLEMS  UNUSUAL RASH Items with * indicate a potential emergency and should be followed up as soon as possible.  Feel free to call the clinic should you have any questions or concerns. The clinic phone number is [phone].  Please show the CHEMO ALERT CARD at check-in to the Emergency Department and triage nurse.   

## 2020-01-01 NOTE — Progress Notes (Signed)
Richard Wallace tolerated day 4 treatment today without incidence.  Vital signs WNL prior to discharge.  Discharged ambulatory.

## 2020-01-02 ENCOUNTER — Inpatient Hospital Stay (HOSPITAL_COMMUNITY): Payer: Medicare Other

## 2020-01-02 ENCOUNTER — Encounter (HOSPITAL_COMMUNITY): Payer: Self-pay

## 2020-01-02 ENCOUNTER — Other Ambulatory Visit: Payer: Self-pay

## 2020-01-02 VITALS — BP 144/85 | HR 66 | Temp 97.5°F | Resp 18

## 2020-01-02 DIAGNOSIS — Z5111 Encounter for antineoplastic chemotherapy: Secondary | ICD-10-CM | POA: Diagnosis not present

## 2020-01-02 DIAGNOSIS — C9200 Acute myeloblastic leukemia, not having achieved remission: Secondary | ICD-10-CM

## 2020-01-02 DIAGNOSIS — D46Z Other myelodysplastic syndromes: Secondary | ICD-10-CM

## 2020-01-02 DIAGNOSIS — Z95828 Presence of other vascular implants and grafts: Secondary | ICD-10-CM

## 2020-01-02 MED ORDER — HEPARIN SOD (PORK) LOCK FLUSH 100 UNIT/ML IV SOLN
500.0000 [IU] | Freq: Once | INTRAVENOUS | Status: AC | PRN
  Administered 2020-01-02: 500 [IU]

## 2020-01-02 MED ORDER — SODIUM CHLORIDE 0.9% FLUSH
10.0000 mL | INTRAVENOUS | Status: DC | PRN
  Administered 2020-01-02: 10 mL

## 2020-01-02 MED ORDER — SODIUM CHLORIDE 0.9 % IV SOLN
15.0000 mg/m2 | Freq: Once | INTRAVENOUS | Status: AC
  Administered 2020-01-02: 35 mg via INTRAVENOUS
  Filled 2020-01-02: qty 7

## 2020-01-02 MED ORDER — SODIUM CHLORIDE 0.9 % IV SOLN: Freq: Once | INTRAVENOUS | Status: AC

## 2020-01-02 MED ORDER — SODIUM CHLORIDE 0.9 % IV SOLN
8.0000 mg | Freq: Once | INTRAVENOUS | Status: AC
  Administered 2020-01-02: 8 mg via INTRAVENOUS
  Filled 2020-01-02: qty 4

## 2020-01-02 NOTE — Progress Notes (Signed)
Patient tolerated chemotherapy with no complaints voiced.  Side effects with management reviewed with understanding verbalized.  Port site clean and dry with no bruising or swelling noted at site.  Good blood return noted before and after administration of chemotherapy.  Band aid applied.  Patient left in satisfactory condition with VSS and no s/s of distress noted.   

## 2020-01-15 ENCOUNTER — Other Ambulatory Visit (HOSPITAL_COMMUNITY): Payer: Medicare Other

## 2020-01-15 ENCOUNTER — Other Ambulatory Visit (HOSPITAL_COMMUNITY): Payer: Self-pay | Admitting: Hematology

## 2020-01-15 DIAGNOSIS — C9200 Acute myeloblastic leukemia, not having achieved remission: Secondary | ICD-10-CM

## 2020-01-17 ENCOUNTER — Other Ambulatory Visit (HOSPITAL_COMMUNITY): Payer: Self-pay | Admitting: Hematology

## 2020-01-17 DIAGNOSIS — M25559 Pain in unspecified hip: Secondary | ICD-10-CM

## 2020-01-19 ENCOUNTER — Inpatient Hospital Stay (HOSPITAL_COMMUNITY): Payer: Medicare Other | Attending: Hematology | Admitting: Hematology

## 2020-01-19 ENCOUNTER — Inpatient Hospital Stay (HOSPITAL_COMMUNITY): Payer: Medicare Other

## 2020-01-19 ENCOUNTER — Other Ambulatory Visit: Payer: Self-pay

## 2020-01-19 VITALS — BP 137/85 | HR 79 | Temp 96.9°F | Resp 18 | Wt 231.0 lb

## 2020-01-19 DIAGNOSIS — C9200 Acute myeloblastic leukemia, not having achieved remission: Secondary | ICD-10-CM | POA: Diagnosis not present

## 2020-01-19 DIAGNOSIS — C9201 Acute myeloblastic leukemia, in remission: Secondary | ICD-10-CM | POA: Insufficient documentation

## 2020-01-19 DIAGNOSIS — R918 Other nonspecific abnormal finding of lung field: Secondary | ICD-10-CM | POA: Diagnosis not present

## 2020-01-19 DIAGNOSIS — I129 Hypertensive chronic kidney disease with stage 1 through stage 4 chronic kidney disease, or unspecified chronic kidney disease: Secondary | ICD-10-CM | POA: Diagnosis not present

## 2020-01-19 DIAGNOSIS — K760 Fatty (change of) liver, not elsewhere classified: Secondary | ICD-10-CM | POA: Diagnosis not present

## 2020-01-19 DIAGNOSIS — Z79899 Other long term (current) drug therapy: Secondary | ICD-10-CM | POA: Insufficient documentation

## 2020-01-19 DIAGNOSIS — Z8042 Family history of malignant neoplasm of prostate: Secondary | ICD-10-CM | POA: Insufficient documentation

## 2020-01-19 DIAGNOSIS — N189 Chronic kidney disease, unspecified: Secondary | ICD-10-CM | POA: Insufficient documentation

## 2020-01-19 DIAGNOSIS — Z87891 Personal history of nicotine dependence: Secondary | ICD-10-CM | POA: Diagnosis not present

## 2020-01-19 DIAGNOSIS — Z808 Family history of malignant neoplasm of other organs or systems: Secondary | ICD-10-CM | POA: Insufficient documentation

## 2020-01-19 DIAGNOSIS — M2550 Pain in unspecified joint: Secondary | ICD-10-CM | POA: Diagnosis not present

## 2020-01-19 LAB — COMPREHENSIVE METABOLIC PANEL
ALT: 30 U/L (ref 0–44)
AST: 23 U/L (ref 15–41)
Albumin: 3.8 g/dL (ref 3.5–5.0)
Alkaline Phosphatase: 55 U/L (ref 38–126)
Anion gap: 7 (ref 5–15)
BUN: 19 mg/dL (ref 8–23)
CO2: 24 mmol/L (ref 22–32)
Calcium: 9.4 mg/dL (ref 8.9–10.3)
Chloride: 110 mmol/L (ref 98–111)
Creatinine, Ser: 0.99 mg/dL (ref 0.61–1.24)
GFR calc Af Amer: >60 mL/min (ref >60)
GFR calc non Af Amer: >60 mL/min (ref >60)
Glucose, Bld: 111 mg/dL — ABNORMAL HIGH (ref 70–99)
Potassium: 4.3 mmol/L (ref 3.5–5.1)
Sodium: 141 mmol/L (ref 135–145)
Total Bilirubin: 0.6 mg/dL (ref 0.3–1.2)
Total Protein: 6.3 g/dL — ABNORMAL LOW (ref 6.5–8.1)

## 2020-01-19 LAB — CBC WITH DIFFERENTIAL/PLATELET
Abs Immature Granulocytes: 0.01 10*3/uL (ref 0.00–0.07)
Basophils Absolute: 0 10*3/uL (ref 0.0–0.1)
Basophils Relative: 1 %
Eosinophils Absolute: 0 10*3/uL (ref 0.0–0.5)
Eosinophils Relative: 1 %
HCT: 44.1 % (ref 39.0–52.0)
Hemoglobin: 14.3 g/dL (ref 13.0–17.0)
Immature Granulocytes: 1 %
Lymphocytes Relative: 47 %
Lymphs Abs: 1 10*3/uL (ref 0.7–4.0)
MCH: 33.5 pg (ref 26.0–34.0)
MCHC: 32.4 g/dL (ref 30.0–36.0)
MCV: 103.3 fL — ABNORMAL HIGH (ref 80.0–100.0)
Monocytes Absolute: 0.2 10*3/uL (ref 0.1–1.0)
Monocytes Relative: 8 %
Neutro Abs: 0.9 10*3/uL — ABNORMAL LOW (ref 1.7–7.7)
Neutrophils Relative %: 42 %
Platelets: 131 10*3/uL — ABNORMAL LOW (ref 150–400)
RBC: 4.27 MIL/uL (ref 4.22–5.81)
RDW: 15.9 % — ABNORMAL HIGH (ref 11.5–15.5)
WBC: 2.1 10*3/uL — ABNORMAL LOW (ref 4.0–10.5)
nRBC: 0 % (ref 0.0–0.2)

## 2020-01-19 LAB — LACTATE DEHYDROGENASE: LDH: 144 U/L (ref 98–192)

## 2020-01-19 LAB — MAGNESIUM: Magnesium: 1.9 mg/dL (ref 1.7–2.4)

## 2020-01-19 NOTE — Patient Instructions (Addendum)
[place] at [facility] Discharge Instructions  You were seen today by Dr. Delton Coombes. He went over your recent results. Dr. Delton Coombes will see you back on 11/4 for labs and follow up.   Thank you for choosing [place] at [facility] to provide your oncology and hematology care.  To afford each patient quality time with our provider, please arrive at least 15 minutes before your scheduled appointment time.   If you have a lab appointment with the [facility] please come in thru the Main Entrance and check in at the main information desk  You need to re-schedule your appointment should you arrive 10 or more minutes late.  We strive to give you quality time with our providers, and arriving late affects you and other patients whose appointments are after yours.  Also, if you no show three or more times for appointments you may be dismissed from the clinic at the providers discretion.     Again, thank you for choosing [facility].  Our hope is that these requests will decrease the amount of time that you wait before being seen by our physicians.       _____________________________________________________________  Should you have questions after your visit to [facility], please contact our office at [phone] between the hours of 8:00 a.m. and 4:30 p.m.  Voicemails left after 4:00 p.m. will not be returned until the following business day.  For prescription refill requests, have your pharmacy contact our office and allow 72 hours.    Cancer Center Support Programs:   > Cancer Support Group  2nd Tuesday of the month 1pm-2pm, Journey Room

## 2020-01-19 NOTE — Progress Notes (Signed)
[facility] [address], [place] [address]   CLINIC:  Medical Oncology/Hematology  PCP:  [name], MD [address] [place] [address]  [phone]  REASON FOR VISIT:  Follow-up for AML  PRIOR THERAPY: None  CURRENT THERAPY: Decitabine every 6 weeks and venetoclax daily  INTERVAL HISTORY:  [name], a 74 y.o. 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 Traxton was last seen on 12/29/2019.  Today he reports feeling well and denies having any recent infections, F/C, night sweats, or new coughs. His arthritis pain is stable. His appetite is very good.   REVIEW OF SYSTEMS:  Review of Systems  Constitutional: Positive for fatigue (severe). Negative for appetite change, chills, diaphoresis and fever.  Respiratory: Negative for cough.   Musculoskeletal: Positive for arthralgias (d/t arthritis).  All other systems reviewed and are negative.   PAST MEDICAL/SURGICAL HISTORY:  Past Medical History:  Diagnosis Date  . Arthritis   . 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 Cancer ([place]) 01-28-2014   skin cancer  . CKD (chronic kidney disease), stage III   . GERD (gastroesophageal reflux disease)   . Heart murmur    NOTED DURING PHYSICAL WHEN HE WAS ENLISTING IN MILITARY , DIDNT KNOW UNTIL THAT TIME AND REPORTS , "THATS THE LAST I HEARD ABOUT IT "   . History of hypertension    no longer issue  . History of kidney stones   . History of malignant melanoma of skin    excision top of scalp 2015-- no recurrence  . History of urinary retention    post op lumbar fusion surgery 04/ 2016  . Hypertension   . 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 Left ureteral calculus   . Seasonal allergies   . Wears glasses   . Wears glasses   . Wears partial dentures    upper and lower   Past Surgical History:  Procedure Laterality Date  . ANKLE FUSION Right 2007  .  CARPAL TUNNEL RELEASE Left 12/28/2009   w/ pulley release left long finger  . CARPAL TUNNEL RELEASE Right 07/22/2013   Procedure: RIGHT CARPAL TUNNEL RELEASE;  Surgeon: Cammie Sickle., MD;  Location: [facility];  Service: Orthopedics;  Laterality: Right;  . COLONOSCOPY    . CYSTO/  LEFT RETROGRADE PYELOGRAM  11/21/2010  . CYSTOSCOPY WITH STENT PLACEMENT Left 03/09/2016   Procedure: CYSTOSCOPY WITH STENT PLACEMENT;  Surgeon: Franchot Gallo, MD;  Location[facility];  Service: Urology;  Laterality: Left;  . CYSTOSCOPY/RETROGRADE/URETEROSCOPY/STONE EXTRACTION WITH BASKET Left 03/09/2016   Procedure: CYSTOSCOPY/RETROGRADE/URETEROSCOPY/STONE EXTRACTION WITH BASKET;  Surgeon: Franchot Gallo, MD;  Location: [facility];  Service: Urology;  Laterality: Left;  . LEFT URETEROSCOPIC LASER LITHOTRIPSY STONE EXTRACTION/ STENT PLACEMENT  05/23/2010  . MOHS SURGERY     TOP OF THE HEAD   . ORIF ANKLE FRACTURE Right 1978  . PORT-A-CATH REMOVAL Right 02/14/2019   Procedure: MINOR REMOVAL PORT-A-CATH;  Surgeon: Aviva Signs, MD;  Location: [facility];  Service: General;  Laterality: Right;  . PORTACATH PLACEMENT Right 08/19/2018   Procedure: INSERTION PORT-A-CATH (attached catheter in right subclavian);  Surgeon: Aviva Signs, MD;  Location: [facility];  Service: General;  Laterality: Right;  . PORTACATH PLACEMENT Left 05/16/2019   Procedure: INSERTION PORT-A-CATH (attached catheter in left subclavian);  Surgeon: Aviva Signs, MD;  Location: [facility];  Service: General;  Laterality: Left;  . POSTERIOR LUMBAR FUSION  08/21/2014   laminectomy and decompression L2 -- L5  . RIGHT LOWER LEG SURGERY  X3  1975 to 1976   including ORIF  . TONSILLECTOMY AND ADENOIDECTOMY  1986  . UMBILICAL HERNIA REPAIR  2009 approx    SOCIAL HISTORY:  Social History   Socioeconomic History  . Marital status: Married    Spouse name: Not on file  . Number of children: Not on file  .  Years of education: Not on file  . Highest education level: Not on file  Occupational History  . Not on file  Tobacco Use  . Smoking status: Former Smoker    Years: 20.00    Types: Cigarettes    Quit date: 07/17/1986    Years since quitting: 33.5  . Smokeless tobacco: Never Used  Vaping Use  . Vaping Use: Never used  Substance and Sexual Activity  . Alcohol use: Yes    Alcohol/week: 7.0 - 14.0 standard drinks    Types: 7 - 14 Cans of beer per week    Comment: 1 -2 beer daily  . Drug use: No  . Sexual activity: Not Currently  Other Topics Concern  . Not on file  Social History Narrative  . Not on file   Social Determinants of Health   Financial Resource Strain: Unknown  . Difficulty of Paying Living Expenses: Patient refused  Food Insecurity: Unknown  . Worried About Charity fundraiser in the Last Year: Patient refused  . Ran Out of Food in the Last Year: Patient refused  Transportation Needs: Unknown  . Lack of Transportation (Medical): Patient refused  . Lack of Transportation (Non-Medical): Patient refused  Physical Activity: Unknown  . Days of Exercise per Week: Patient refused  . Minutes of Exercise per Session: Patient refused  Stress: Unknown  . Feeling of Stress : Patient refused  Social Connections: Unknown  . Frequency of Communication with Friends and Family: Patient refused  . Frequency of Social Gatherings with Friends and Family: Patient refused  . Attends Religious Services: Patient refused  . Active Member of Clubs or Organizations: Patient refused  . Attends Archivist Meetings: Patient refused  . Marital Status: Patient refused  Intimate Partner Violence: Unknown  . Fear of Current or Ex-Partner: Patient refused  . Emotionally Abused: Patient refused  . Physically Abused: Patient refused  . Sexually Abused: Patient refused    FAMILY HISTORY:  Family History  Problem Relation Age of Onset  . Stroke Mother   . Prostate cancer Father     . Bone cancer Father   . Diverticulitis Father   . Rheum arthritis Sister   . Urinary tract infection Sister   . Colon cancer Neg Hx     CURRENT MEDICATIONS:  Current Outpatient Medications  Medication Sig Dispense Refill  . acetaminophen (TYLENOL) 500 MG tablet Take 1,000 mg by mouth 3 (three) times daily.     Marland Kitchen allopurinol (ZYLOPRIM) 300 MG tablet Take 300 mg by mouth at bedtime.     Marland Kitchen amLODipine (NORVASC) 2.5 MG tablet Take 1 tablet (2.5 mg total) by mouth daily. 90 tablet 2  . cetirizine (ZYRTEC) 10 MG tablet Take 10 mg by mouth daily. IN THE MORNING    . Cholecalciferol (VITAMIN D3) 1.25 MG (50000 UT) CAPS Take 1 capsule by mouth daily.    . diclofenac sodium (VOLTAREN) 1 % GEL Apply 1 g topically 3 (three)  times daily as needed (knee pain.).     Marland Kitchen docusate sodium (COLACE) 100 MG capsule Take 100 mg by mouth at bedtime.     . lansoprazole (PREVACID) 15 MG capsule Take 15 mg by mouth daily.     . magnesium oxide (MAG-OX) 400 (241.3 Mg) MG tablet TAKE 1 TABLET BY MOUTH THREE TIMES DAILY 90 tablet 3  . tamsulosin (FLOMAX) 0.4 MG CAPS capsule Take 0.4 mg by mouth every evening.     . traMADol (ULTRAM) 50 MG tablet TAKE 1 TABLET BY MOUTH EVERY 6 HOURS AS NEEDED FOR PAIN 120 tablet 0  . VENCLEXTA 100 MG TABS TAKE 2 TABLETS (200 MG) BY MOUTH DAILY 60 tablet 0  . lidocaine-prilocaine (EMLA) cream Apply 1 application topically See admin instructions. ONE HOUR PRIOR TO CHEMOTHERAPY APPOINTMENT (Patient not taking: Reported on 01/19/2020)     No current facility-administered medications for this visit.   Facility-Administered Medications Ordered in Other Visits  Medication Dose Route Frequency Provider Last Rate Last Admin  . sodium chloride flush (NS) 0.9 % injection 10 mL  10 mL Intracatheter PRN [name], MD   10 mL at 11/21/19 1030  . sodium chloride flush (NS) 0.9 % injection 20 mL  20 mL Intravenous PRN [name], MD   20 mL at 07/21/19 1053    ALLERGIES:  No  Known Allergies  PHYSICAL EXAM:  Performance status (ECOG): 1 - Symptomatic but completely ambulatory  Vitals:   01/19/20 1309  BP: 137/85  Pulse: 79  Resp: 18  Temp: (!) 96.9 F (36.1 C)  SpO2: 98%   Wt Readings from Last 3 Encounters:  01/19/20 231 lb (104.8 kg)  12/29/19 230 lb 3.2 oz (104.4 kg)  12/02/19 (!) 227 lb 8 oz (103.2 kg)   Physical Exam Vitals reviewed.  Constitutional:      Appearance: Normal appearance. He is obese.  Cardiovascular:     Rate and Rhythm: Normal rate and regular rhythm.     Pulses: Normal pulses.     Heart sounds: Normal heart sounds.  Pulmonary:     Effort: Pulmonary effort is normal.     Breath sounds: Normal breath sounds.  Abdominal:     Palpations: Abdomen is soft. There is no hepatomegaly, splenomegaly or mass.     Tenderness: There is no abdominal tenderness.     Hernia: No hernia is present.  Musculoskeletal:     Right lower leg: Edema (1+) present.     Left lower leg: Edema (trace) present.  Neurological:     General: No focal deficit present.     Mental Status: He is alert and oriented to person, place, and time.  Psychiatric:        Mood and Affect: Mood normal.        Behavior: Behavior normal.     LABORATORY DATA:  I have reviewed the labs as listed.  CBC Latest Ref Rng & Units 01/19/2020 12/29/2019 12/02/2019  WBC 4.0 - 10.5 K/uL 2.1(L) 3.1(L) 2.2(L)  Hemoglobin 13.0 - 17.0 g/dL 14.3 14.5 14.1  Hematocrit 39 - 52 % 44.1 45.0 42.9  Platelets 150 - 400 K/uL 131(L) 138(L) 106(L)   CMP Latest Ref Rng & Units 12/29/2019 12/02/2019 11/17/2019  Glucose 70 - 99 mg/dL 116(H) 109(H) 118(H)  BUN 8 - 23 mg/dL 20 22 23   Creatinine 0.61 - 1.24 mg/dL 0.96 0.95 1.00  Sodium 135 - 145 mmol/L 138 141 139  Potassium 3.5 - 5.1 mmol/L 4.1 4.3 4.5  Chloride  98 - 111 mmol/L 106 111 107  CO2 22 - 32 mmol/L 23 21(L) 21(L)  Calcium 8.9 - 10.3 mg/dL 9.3 9.3 9.4  Total Protein 6.5 - 8.1 g/dL 6.5 6.4(L) 6.4(L)  Total Bilirubin 0.3 - 1.2 mg/dL  0.7 0.8 0.7  Alkaline Phos 38 - 126 U/L 63 66 65  AST 15 - 41 U/L 23 27 21   ALT 0 - 44 U/L 31 36 25      Component Value Date/Time   RBC 4.27 01/19/2020 1255   MCV 103.3 (H) 01/19/2020 1255   MCH 33.5 01/19/2020 1255   MCHC 32.4 01/19/2020 1255   RDW 15.9 (H) 01/19/2020 1255   LYMPHSABS PENDING 01/19/2020 1255   MONOABS PENDING 01/19/2020 1255   EOSABS PENDING 01/19/2020 1255   BASOSABS PENDING 01/19/2020 1255   Lab Results  Component Value Date   LDH 159 12/29/2019   LDH 146 12/02/2019   LDH 140 11/17/2019    DIAGNOSTIC IMAGING:  I have independently reviewed the scans and discussed with the patient. No results found.   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 -BMBX on 02/25/2019 after cycle 1 did not show any evidence of leukemia. -CT CAP on 02/12/2019 shows numerous bilateral irregular/spiculated pulmonary nodules measuring up to 14 mm, nonspecific. Hepatic steatosis. Spleen is normal.   PLAN:  1. AML: -His last treatment was 3 weeks ago.  He did not experience any major side effects. -He stopped taking venetoclax 1 week ago. -We reviewed his CBC which showed low white count of 2.1 with ANC of 900.  Platelets are 131. -He does not require any transfusion.  I will see him back in 3 weeks and will start his next cycle of treatment.  2. Arthralgias: -Continue tramadol every 6 hours which is helping.  3. Hypomagnesemia: -Magnesium today is 1.9.  Continue magnesium 3 times a day.  4. Hypertension: -Continue Norvasc 2.5 mg daily.  Lisinopril on hold.  5. CKD: -Creatinine today 0.99.  This has improved significantly since lisinopril was on hold.  Orders placed this encounter:  No orders of the defined types were placed in this encounter.    [name], MD [place] [phone]   I, Milinda Antis, am acting as a  scribe for Dr. Sanda Linger.  I, [name] MD, have reviewed the above documentation for accuracy and completeness, and I agree with the above.

## 2020-01-27 ENCOUNTER — Inpatient Hospital Stay (HOSPITAL_COMMUNITY): Payer: Medicare Other | Admitting: General Practice

## 2020-01-27 DIAGNOSIS — C9200 Acute myeloblastic leukemia, not having achieved remission: Secondary | ICD-10-CM

## 2020-01-27 NOTE — Progress Notes (Signed)
[facility] CSW Progress Notes  Call to patient to offer resources for support groups available in the cancer service line.  No answer, left VM w my contact information and encouragement to call back.  Emailed copies of [facility] calendars to email on record.  Edwyna Shell, LCSW Clinical Social Worker Phone:  [phone]

## 2020-02-09 ENCOUNTER — Inpatient Hospital Stay (HOSPITAL_COMMUNITY): Payer: Medicare Other | Attending: Hematology

## 2020-02-09 ENCOUNTER — Inpatient Hospital Stay (HOSPITAL_COMMUNITY): Payer: Medicare Other

## 2020-02-09 ENCOUNTER — Other Ambulatory Visit: Payer: Self-pay

## 2020-02-09 ENCOUNTER — Other Ambulatory Visit (HOSPITAL_COMMUNITY): Payer: Self-pay | Admitting: *Deleted

## 2020-02-09 ENCOUNTER — Inpatient Hospital Stay (HOSPITAL_BASED_OUTPATIENT_CLINIC_OR_DEPARTMENT_OTHER): Payer: Medicare Other | Admitting: Hematology

## 2020-02-09 ENCOUNTER — Encounter (HOSPITAL_COMMUNITY): Payer: Self-pay | Admitting: Hematology

## 2020-02-09 VITALS — BP 153/90 | HR 69 | Temp 97.8°F | Resp 17

## 2020-02-09 VITALS — BP 118/76 | HR 50 | Temp 97.0°F | Resp 18 | Wt 227.7 lb

## 2020-02-09 DIAGNOSIS — Z79899 Other long term (current) drug therapy: Secondary | ICD-10-CM | POA: Insufficient documentation

## 2020-02-09 DIAGNOSIS — C9200 Acute myeloblastic leukemia, not having achieved remission: Secondary | ICD-10-CM

## 2020-02-09 DIAGNOSIS — Z95828 Presence of other vascular implants and grafts: Secondary | ICD-10-CM

## 2020-02-09 DIAGNOSIS — D46Z Other myelodysplastic syndromes: Secondary | ICD-10-CM

## 2020-02-09 DIAGNOSIS — Z5111 Encounter for antineoplastic chemotherapy: Secondary | ICD-10-CM | POA: Insufficient documentation

## 2020-02-09 DIAGNOSIS — I129 Hypertensive chronic kidney disease with stage 1 through stage 4 chronic kidney disease, or unspecified chronic kidney disease: Secondary | ICD-10-CM | POA: Insufficient documentation

## 2020-02-09 DIAGNOSIS — N189 Chronic kidney disease, unspecified: Secondary | ICD-10-CM | POA: Diagnosis not present

## 2020-02-09 LAB — CBC WITH DIFFERENTIAL/PLATELET
Abs Immature Granulocytes: 0.03 10*3/uL (ref 0.00–0.07)
Basophils Absolute: 0 10*3/uL (ref 0.0–0.1)
Basophils Relative: 1 %
Eosinophils Absolute: 0.1 10*3/uL (ref 0.0–0.5)
Eosinophils Relative: 2 %
HCT: 43.8 % (ref 39.0–52.0)
Hemoglobin: 14.3 g/dL (ref 13.0–17.0)
Immature Granulocytes: 1 %
Lymphocytes Relative: 30 %
Lymphs Abs: 0.8 10*3/uL (ref 0.7–4.0)
MCH: 33.4 pg (ref 26.0–34.0)
MCHC: 32.6 g/dL (ref 30.0–36.0)
MCV: 102.3 fL — ABNORMAL HIGH (ref 80.0–100.0)
Monocytes Absolute: 0.4 10*3/uL (ref 0.1–1.0)
Monocytes Relative: 13 %
Neutro Abs: 1.5 10*3/uL — ABNORMAL LOW (ref 1.7–7.7)
Neutrophils Relative %: 53 %
Platelets: 161 10*3/uL (ref 150–400)
RBC: 4.28 MIL/uL (ref 4.22–5.81)
RDW: 15.2 % (ref 11.5–15.5)
WBC: 2.8 10*3/uL — ABNORMAL LOW (ref 4.0–10.5)
nRBC: 0 % (ref 0.0–0.2)

## 2020-02-09 LAB — COMPREHENSIVE METABOLIC PANEL
ALT: 27 U/L (ref 0–44)
AST: 24 U/L (ref 15–41)
Albumin: 3.8 g/dL (ref 3.5–5.0)
Alkaline Phosphatase: 61 U/L (ref 38–126)
Anion gap: 10 (ref 5–15)
BUN: 20 mg/dL (ref 8–23)
CO2: 26 mmol/L (ref 22–32)
Calcium: 9.5 mg/dL (ref 8.9–10.3)
Chloride: 106 mmol/L (ref 98–111)
Creatinine, Ser: 1.08 mg/dL (ref 0.61–1.24)
GFR calc Af Amer: >60 mL/min (ref >60)
GFR calc non Af Amer: >60 mL/min (ref >60)
Glucose, Bld: 107 mg/dL — ABNORMAL HIGH (ref 70–99)
Potassium: 4.7 mmol/L (ref 3.5–5.1)
Sodium: 142 mmol/L (ref 135–145)
Total Bilirubin: 0.7 mg/dL (ref 0.3–1.2)
Total Protein: 6.1 g/dL — ABNORMAL LOW (ref 6.5–8.1)

## 2020-02-09 LAB — MAGNESIUM: Magnesium: 2 mg/dL (ref 1.7–2.4)

## 2020-02-09 LAB — LACTATE DEHYDROGENASE: LDH: 158 U/L (ref 98–192)

## 2020-02-09 MED ORDER — DICLOFENAC SODIUM 1 % EX GEL: CUTANEOUS | 3 refills | Status: DC

## 2020-02-09 MED ORDER — SODIUM CHLORIDE 0.9% FLUSH
10.0000 mL | INTRAVENOUS | Status: DC | PRN
  Administered 2020-02-09: 10 mL

## 2020-02-09 MED ORDER — HYDROCODONE-ACETAMINOPHEN 5-325 MG PO TABS
1.0000 | ORAL_TABLET | Freq: Two times a day (BID) | ORAL | 0 refills | Status: DC | PRN
Start: 2020-02-09 — End: 2020-02-26

## 2020-02-09 MED ORDER — HEPARIN SOD (PORK) LOCK FLUSH 100 UNIT/ML IV SOLN
500.0000 [IU] | Freq: Once | INTRAVENOUS | Status: AC | PRN
  Administered 2020-02-09: 500 [IU]

## 2020-02-09 MED ORDER — SODIUM CHLORIDE 0.9 % IV SOLN
15.0000 mg/m2 | Freq: Once | INTRAVENOUS | Status: AC
  Administered 2020-02-09: 35 mg via INTRAVENOUS
  Filled 2020-02-09: qty 7

## 2020-02-09 MED ORDER — SODIUM CHLORIDE 0.9 % IV SOLN
8.0000 mg | Freq: Once | INTRAVENOUS | Status: AC
  Administered 2020-02-09: 8 mg via INTRAVENOUS
  Filled 2020-02-09: qty 4

## 2020-02-09 MED ORDER — SODIUM CHLORIDE 0.9 % IV SOLN: Freq: Once | INTRAVENOUS | Status: AC

## 2020-02-09 NOTE — Progress Notes (Signed)
Tolerated treatment well today without incidence.  Vital signs stable prior to discharge. Discharged in stable condition via walker.

## 2020-02-09 NOTE — Patient Instructions (Signed)
[facility] Discharge Instructions for Patients Receiving Chemotherapy  Today you received the following chemotherapy agents   To help prevent nausea and vomiting after your treatment, we encourage you to take your nausea medication   If you develop nausea and vomiting that is not controlled by your nausea medication, call the clinic.   BELOW ARE SYMPTOMS THAT SHOULD BE REPORTED IMMEDIATELY:  *FEVER GREATER THAN 100.5 F  *CHILLS WITH OR WITHOUT FEVER  NAUSEA AND VOMITING THAT IS NOT CONTROLLED WITH YOUR NAUSEA MEDICATION  *UNUSUAL SHORTNESS OF BREATH  *UNUSUAL BRUISING OR BLEEDING  TENDERNESS IN MOUTH AND THROAT WITH OR WITHOUT PRESENCE OF ULCERS  *URINARY PROBLEMS  *BOWEL PROBLEMS  UNUSUAL RASH Items with * indicate a potential emergency and should be followed up as soon as possible.  Feel free to call the clinic should you have any questions or concerns. The clinic phone number is [phone].  Please show the CHEMO ALERT CARD at check-in to the Emergency Department and triage nurse.   

## 2020-02-09 NOTE — Patient Instructions (Signed)
[place] at [facility] Discharge Instructions  You were seen today by Dr. Delton Coombes. He went over your recent results. You received your treatment today. Start taking the venetoclax today and take it for 2 weeks, stopping on 10/18. You will be prescribed hydrocodone to take twice daily for your pain. Dr. Delton Coombes will see you back in 3 weeks for labs and follow up.   Thank you for choosing [place] at [facility] to provide your oncology and hematology care.  To afford each patient quality time with our provider, please arrive at least 15 minutes before your scheduled appointment time.   If you have a lab appointment with the [facility] please come in thru the Main Entrance and check in at the main information desk  You need to re-schedule your appointment should you arrive 10 or more minutes late.  We strive to give you quality time with our providers, and arriving late affects you and other patients whose appointments are after yours.  Also, if you no show three or more times for appointments you may be dismissed from the clinic at the providers discretion.     Again, thank you for choosing [facility].  Our hope is that these requests will decrease the amount of time that you wait before being seen by our physicians.       _____________________________________________________________  Should you have questions after your visit to [facility], please contact our office at [phone] between the hours of 8:00 a.m. and 4:30 p.m.  Voicemails left after 4:00 p.m. will not be returned until the following business day.  For prescription refill requests, have your pharmacy contact our office and allow 72 hours.    Cancer Center Support Programs:   > Cancer Support Group  2nd Tuesday of the month 1pm-2pm, Journey Room

## 2020-02-09 NOTE — Progress Notes (Signed)
Patient was assessed by Dr. Katragadda and labs have been reviewed.  Patient is okay to proceed with treatment today. Primary RN and pharmacy aware.   

## 2020-02-09 NOTE — Progress Notes (Signed)
Labs reviewed ok to treat per MD.  °

## 2020-02-09 NOTE — Progress Notes (Signed)
Richard Wallace, Richard Wallace [id number]   CLINIC:  Medical Oncology/Hematology  PCP:  [name], MD [address] 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[place])   08/22/2018 - 12/06/2018 Chemotherapy   The patient had palonosetron (ALOXI) injection 0.25 mg, 0.25 mg, Intravenous,  Once, 4 of 6 cycles Administration: 0.25 mg (08/22/2018), 0.25 mg (08/26/2018), 0.25 mg (08/28/2018), 0.25 mg (08/30/2018), 0.25 mg (10/01/2018), 0.25 mg (10/02/2018), 0.25 mg (11/04/2018), 0.25 mg (09/23/2018), 0.25 mg (09/25/2018), 0.25 mg (09/27/2018), 0.25 mg (10/28/2018), 0.25 mg (10/30/2018), 0.25 mg (11/01/2018), 0.25 mg (12/02/2018), 0.25 mg (12/04/2018), 0.25 mg (12/06/2018) azaCITIDine (VIDAZA) 100 mg in sodium chloride 0.9 % 50 mL chemo infusion, 110 mg (66.7 % of original dose 75 mg/m2), Intravenous, Once, 4 of 6 cycles Dose modification: 50 mg/m2 (original dose 75 mg/m2, Cycle 1, Reason: Provider Judgment), 50 mg/m2 (original dose 75 mg/m2, Cycle 2, Reason: Provider Judgment) Administration: 100 mg (08/22/2018), 100 mg (08/23/2018), 100 mg (08/26/2018), 100 mg (08/27/2018), 100 mg (08/28/2018), 100 mg (08/29/2018), 100 mg (08/30/2018), 100 mg (10/01/2018), 100 mg (10/02/2018), 100 mg (11/04/2018), 100 mg (11/05/2018), 100 mg (09/23/2018), 100 mg (09/24/2018), 100 mg (09/25/2018), 100 mg (09/26/2018), 100 mg (09/27/2018), 100 mg (10/28/2018), 100 mg (10/29/2018), 100 mg (10/30/2018), 100 mg (10/31/2018), 100 mg (11/01/2018), 100 mg (12/02/2018), 100 mg (12/03/2018), 100 mg (12/04/2018), 100 mg (12/05/2018), 100 mg (12/06/2018)  for chemotherapy treatment.    01/20/2019 -  Chemotherapy   The patient had  decitabine (DACOGEN) 45 mg in sodium chloride 0.9 % 50 mL chemo infusion, 20 mg/m2 = 45 mg, Intravenous,  Once, 9 of 10 cycles Dose modification: 15 mg/m2 (original dose 20 mg/m2, Cycle 2, Reason: Other (see comments), Comment: neutropenic fever) Administration: 45 mg (01/20/2019), 45 mg (01/21/2019), 45 mg (01/22/2019), 45 mg (01/23/2019), 45 mg (01/24/2019), 35 mg (03/17/2019), 35 mg (03/18/2019), 35 mg (03/19/2019), 35 mg (03/20/2019), 35 mg (03/21/2019), 35 mg (04/22/2019), 35 mg (04/23/2019), 35 mg (04/24/2019), 35 mg (04/25/2019), 35 mg (05/26/2019), 35 mg (05/27/2019), 35 mg (05/28/2019), 35 mg (05/29/2019), 35 mg (05/30/2019), 35 mg (07/07/2019), 35 mg (07/08/2019), 35 mg (07/09/2019), 35 mg (07/10/2019), 35 mg (07/11/2019), 35 mg (08/18/2019), 35 mg (08/19/2019), 35 mg (08/20/2019), 35 mg (08/21/2019), 35 mg (08/22/2019), 35 mg (09/29/2019), 35 mg (09/30/2019), 35 mg (10/01/2019), 35 mg (10/02/2019), 35 mg (10/03/2019), 35 mg (11/17/2019), 35 mg (11/18/2019), 35 mg (11/19/2019), 35 mg (11/20/2019), 35 mg (11/21/2019), 35 mg (12/29/2019), 35 mg (12/30/2019), 35 mg (12/31/2019), 35 mg (01/01/2020), 35 mg (01/02/2020) ondansetron (ZOFRAN) 8 mg in sodium chloride 0.9 % 50 mL IVPB, 8 mg (100 % of original dose 8 mg), Intravenous,  Once, 9 of 10 cycles Dose modification: 8 mg (original dose 8 mg, Cycle 1) Administration: 8 mg (01/20/2019), 8 mg (01/21/2019), 8 mg (01/22/2019), 8 mg (01/23/2019), 8 mg (01/24/2019), 8 mg (03/17/2019), 8 mg (03/18/2019), 8 mg (03/19/2019), 8 mg (03/20/2019), 8 mg (03/21/2019), 8 mg (04/22/2019), 8 mg (04/23/2019), 8 mg (04/24/2019), 8 mg (04/25/2019), 8 mg (05/26/2019), 8 mg (05/27/2019), 8 mg (05/28/2019), 8 mg (05/29/2019), 8 mg (05/30/2019), 8 mg (07/07/2019), 8 mg (07/08/2019), 8 mg (07/09/2019), 8 mg (07/10/2019), 8 mg (07/11/2019), 8 mg (08/18/2019), 8 mg (  08/19/2019), 8 mg (08/20/2019), 8 mg (08/21/2019), 8 mg (08/22/2019), 8 mg (09/29/2019), 8 mg (09/30/2019), 8 mg (10/01/2019), 8 mg (10/02/2019), 8 mg (10/03/2019), 8 mg (11/17/2019), 8 mg  (11/18/2019), 8 mg (11/19/2019), 8 mg (11/20/2019), 8 mg (11/21/2019), 8 mg (12/29/2019), 8 mg (12/30/2019), 8 mg (12/31/2019), 8 mg (01/01/2020), 8 mg (01/02/2020)  for chemotherapy treatment.    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[place])   01/20/2019 -  Chemotherapy   The patient had decitabine (DACOGEN) 45 mg in sodium chloride 0.9 % 50 mL chemo infusion, 20 mg/m2 = 45 mg, Intravenous,  Once, 9 of 10 cycles Dose modification: 15 mg/m2 (original dose 20 mg/m2, Cycle 2, Reason: Other (see comments), Comment: neutropenic fever) Administration: 45 mg (01/20/2019), 45 mg (01/21/2019), 45 mg (01/22/2019), 45 mg (01/23/2019), 45 mg (01/24/2019), 35 mg (03/17/2019), 35 mg (03/18/2019), 35 mg (03/19/2019), 35 mg (03/20/2019), 35 mg (03/21/2019), 35 mg (04/22/2019), 35 mg (04/23/2019), 35 mg (04/24/2019), 35 mg (04/25/2019), 35 mg (05/26/2019), 35 mg (05/27/2019), 35 mg (05/28/2019), 35 mg (05/29/2019), 35 mg (05/30/2019), 35 mg (07/07/2019), 35 mg (07/08/2019), 35 mg (07/09/2019), 35 mg (07/10/2019), 35 mg (07/11/2019), 35 mg (08/18/2019), 35 mg (08/19/2019), 35 mg (08/20/2019), 35 mg (08/21/2019), 35 mg (08/22/2019), 35 mg (09/29/2019), 35 mg (09/30/2019), 35 mg (10/01/2019), 35 mg (10/02/2019), 35 mg (10/03/2019), 35 mg (11/17/2019), 35 mg (11/18/2019), 35 mg (11/19/2019), 35 mg (11/20/2019), 35 mg (11/21/2019), 35 mg (12/29/2019), 35 mg (12/30/2019), 35 mg (12/31/2019), 35 mg (01/01/2020), 35 mg (01/02/2020) ondansetron (ZOFRAN) 8 mg in sodium chloride 0.9 % 50 mL IVPB, 8 mg (100 % of original dose 8 mg), Intravenous,  Once, 9 of 10 cycles Dose modification: 8 mg (original dose 8 mg, Cycle 1) Administration: 8 mg (01/20/2019), 8 mg (01/21/2019), 8 mg (01/22/2019), 8 mg (01/23/2019), 8 mg (01/24/2019), 8 mg (03/17/2019), 8 mg (03/18/2019), 8 mg (03/19/2019), 8 mg (03/20/2019), 8 mg (03/21/2019), 8 mg (04/22/2019), 8 mg (04/23/2019), 8 mg (04/24/2019), 8 mg (04/25/2019), 8 mg (05/26/2019), 8 mg  (05/27/2019), 8 mg (05/28/2019), 8 mg (05/29/2019), 8 mg (05/30/2019), 8 mg (07/07/2019), 8 mg (07/08/2019), 8 mg (07/09/2019), 8 mg (07/10/2019), 8 mg (07/11/2019), 8 mg (08/18/2019), 8 mg (08/19/2019), 8 mg (08/20/2019), 8 mg (08/21/2019), 8 mg (08/22/2019), 8 mg (09/29/2019), 8 mg (09/30/2019), 8 mg (10/01/2019), 8 mg (10/02/2019), 8 mg (10/03/2019), 8 mg (11/17/2019), 8 mg (11/18/2019), 8 mg (11/19/2019), 8 mg (11/20/2019), 8 mg (11/21/2019), 8 mg (12/29/2019), 8 mg (12/30/2019), 8 mg (12/31/2019), 8 mg (01/01/2020), 8 mg (01/02/2020)  for chemotherapy treatment.      CANCER STAGING: Cancer Staging No matching staging information was found for the patient.  INTERVAL HISTORY:  [name], a 74 y.o. male, returns for routine follow-up and consideration for next cycle of chemotherapy. Kyley was last seen on 01/19/2020.  Due for cycle #10 of decitabine today.   Overall, he tells me he has been feeling good. He is tolerating the treatments well. He complains of pain in his knees and back and requests stronger pain relieve than his tramadol. He denies having any recent F/C. He is taking the venetoclax BID.  Overall, he feels ready for next cycle of chemo today.    REVIEW OF SYSTEMS:  Review of Systems  Constitutional: Positive for appetite change (75%) and fatigue (depleted).  Cardiovascular: Positive for leg swelling (R leg swelling).  Musculoskeletal: Positive for arthralgias (2/10 knees and back pain).  All other systems reviewed and are negative.  PAST MEDICAL/SURGICAL HISTORY:  Past Medical History:  Diagnosis Date  . Arthritis   . 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 Cancer ([place]) 01-28-2014   skin cancer  . CKD (chronic kidney disease), stage III ([place])   . GERD (gastroesophageal reflux disease)   . Heart murmur    NOTED DURING PHYSICAL WHEN HE WAS ENLISTING IN [facility] , DIDNT KNOW UNTIL THAT TIME AND REPORTS , "THATS THE LAST I HEARD ABOUT IT "   . History of hypertension    no longer  issue  . History of kidney stones   . History of malignant melanoma of skin    excision top of scalp 2015-- no recurrence  . History of urinary retention    post op lumbar fusion surgery 04/ 2016  . Hypertension   . 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 Left ureteral calculus   . Seasonal allergies   . Wears glasses   . Wears glasses   . Wears partial dentures    upper and lower   Past Surgical History:  Procedure Laterality Date  . ANKLE FUSION Right 2007  . CARPAL TUNNEL RELEASE Left 12/28/2009   w/ pulley release left long finger  . CARPAL TUNNEL RELEASE Right 07/22/2013   Procedure: RIGHT CARPAL TUNNEL RELEASE;  Surgeon: Cammie Sickle., MD;  Location: [facility];  Service: Orthopedics;  Laterality: Right;  . COLONOSCOPY    . CYSTO/  LEFT RETROGRADE PYELOGRAM  11/21/2010  . CYSTOSCOPY WITH STENT PLACEMENT Left 03/09/2016   Procedure: CYSTOSCOPY WITH STENT PLACEMENT;  Surgeon: Franchot Gallo, MD;  Location: [facility];  Service: Urology;  Laterality: Left;  . CYSTOSCOPY/RETROGRADE/URETEROSCOPY/STONE EXTRACTION WITH BASKET Left 03/09/2016   Procedure: CYSTOSCOPY/RETROGRADE/URETEROSCOPY/STONE EXTRACTION WITH BASKET;  Surgeon: Franchot Gallo, MD;  Location: [facility];  Service: Urology;  Laterality: Left;  . LEFT URETEROSCOPIC LASER LITHOTRIPSY STONE EXTRACTION/ STENT PLACEMENT  05/23/2010  . MOHS SURGERY     TOP OF THE HEAD   . ORIF ANKLE FRACTURE Right 1978  . PORT-A-CATH REMOVAL Right 02/14/2019   Procedure: MINOR REMOVAL PORT-A-CATH;  Surgeon: Aviva Signs, MD;  Location: [facility];  Service: General;  Laterality: Right;  . PORTACATH PLACEMENT Right 08/19/2018   Procedure: INSERTION PORT-A-CATH (attached catheter in right subclavian);  Surgeon: Aviva Signs, MD;  Location: [facility];  Service: General;  Laterality: Right;  . PORTACATH PLACEMENT Left 05/16/2019     Procedure: INSERTION PORT-A-CATH (attached catheter in left subclavian);  Surgeon: Aviva Signs, MD;  Location: [facility];  Service: General;  Laterality: Left;  . POSTERIOR LUMBAR FUSION  08/21/2014   laminectomy and decompression L2 -- L5  . RIGHT LOWER LEG SURGERY  X3  1975 to 1976   including ORIF  . TONSILLECTOMY AND ADENOIDECTOMY  1986  . UMBILICAL HERNIA REPAIR  2009 approx    SOCIAL HISTORY:  Social History   Socioeconomic History  . Marital status: Married    Spouse name: Not on file  . Number of children: Not on file  . Years of education: Not on file  . Highest education level: Not on file  Occupational History  . Not on file  Tobacco Use  . Smoking status: Former Smoker    Years: 20.00    Types: Cigarettes    Quit date: 07/17/1986    Years since quitting: 33.5  . Smokeless tobacco: Never Used  Vaping Use  . Vaping Use: Never used  Substance and Sexual Activity  . Alcohol use: Yes    Alcohol/week: 7.0 - 14.0 standard drinks    Types: 7 - 14 Cans of beer per week    Comment: 1 -2 beer daily  . Drug use: No  . Sexual activity: Not Currently  Other Topics Concern  . Not on file  Social History Narrative  . Not on file   Social Determinants of Health   Financial Resource Strain: Unknown  . Difficulty of Paying Living Expenses: Patient refused  Food Insecurity: Unknown  . Worried About Charity fundraiser in the Last Year: Patient refused  . Ran Out of Food in the Last Year: Patient refused  Transportation Needs: Unknown  . Lack of Transportation (Medical): Patient refused  . Lack of Transportation (Non-Medical): Patient refused  Physical Activity: Unknown  . Days of Exercise per Week: Patient refused  . Minutes of Exercise per Session: Patient refused  Stress: Unknown  . Feeling of Stress : Patient refused  Social Connections: Unknown  . Frequency of Communication with Friends and Family: Patient refused  . Frequency of Social Gatherings with Friends  and Family: Patient refused  . Attends Religious Services: Patient refused  . Active Member of Clubs or Organizations: Patient refused  . Attends Archivist Meetings: Patient refused  . Marital Status: Patient refused  Intimate Partner Violence: Unknown  . Fear of Current or Ex-Partner: Patient refused  . Emotionally Abused: Patient refused  . Physically Abused: Patient refused  . Sexually Abused: Patient refused    FAMILY HISTORY:  Family History  Problem Relation Age of Onset  . Stroke Mother   . Prostate cancer Father   . Bone cancer Father   . Diverticulitis Father   . Rheum arthritis Sister   . Urinary tract infection Sister   . Colon cancer Neg Hx     CURRENT MEDICATIONS:  Current Outpatient Medications  Medication Sig Dispense Refill  . acetaminophen (TYLENOL) 500 MG tablet Take 1,000 mg by mouth 3 (three) times daily.     Marland Kitchen allopurinol (ZYLOPRIM) 300 MG tablet Take 300 mg by mouth at bedtime.     Marland Kitchen amLODipine (NORVASC) 2.5 MG tablet Take 1 tablet (2.5 mg total) by mouth daily. 90 tablet 2  . cetirizine (ZYRTEC) 10 MG tablet Take 10 mg by mouth daily. IN THE MORNING    . Cholecalciferol (VITAMIN D3) 1.25 MG (50000 UT) CAPS Take 1 capsule by mouth daily.    . diclofenac sodium (VOLTAREN) 1 % GEL Apply 1 g topically 3 (three) times daily as needed (knee pain.).     Marland Kitchen docusate sodium (COLACE) 100 MG capsule Take 100 mg by mouth at bedtime.     . lansoprazole (PREVACID) 15 MG capsule Take 15 mg by mouth daily.     Marland Kitchen lidocaine-prilocaine (EMLA) cream Apply 1 application topically See admin instructions. ONE HOUR PRIOR TO CHEMOTHERAPY APPOINTMENT (Patient not taking: Reported on 01/19/2020)    . magnesium oxide (MAG-OX) 400 (241.3 Mg) MG tablet TAKE 1 TABLET BY MOUTH THREE TIMES DAILY 90 tablet 3  . tamsulosin (FLOMAX) 0.4 MG CAPS capsule Take 0.4 mg by mouth every evening.     . traMADol (ULTRAM) 50 MG tablet TAKE 1 TABLET BY MOUTH EVERY 6 HOURS AS NEEDED FOR PAIN 120  tablet 0  . VENCLEXTA 100 MG TABS TAKE 2 TABLETS (200 MG) BY MOUTH DAILY 60 tablet 0   No current facility-administered  medications for this visit.   Facility-Administered Medications Ordered in Other Visits  Medication Dose Route Frequency Provider Last Rate Last Admin  . sodium chloride flush (NS) 0.9 % injection 10 mL  10 mL Intracatheter PRN [name], MD   10 mL at 11/21/19 1030  . sodium chloride flush (NS) 0.9 % injection 20 mL  20 mL Intravenous PRN [name], MD   20 mL at 07/21/19 1053    ALLERGIES:  No Known Allergies  PHYSICAL EXAM:   Performance status (ECOG): 1 - Symptomatic but completely ambulatory  Vitals:   02/09/20 1034 02/09/20 1043  BP: (!) 164/77 118/76  Pulse: (!) 50   Resp: 18   Temp: (!) 97 F (36.1 C)   SpO2: 94%    Wt Readings from Last 3 Encounters:  02/09/20 227 lb 11.2 oz (103.3 kg)  01/19/20 231 lb (104.8 kg)  12/29/19 230 lb 3.2 oz (104.4 kg)   Physical Exam Vitals reviewed.  Constitutional:      Appearance: Normal appearance. He is obese.  Cardiovascular:     Rate and Rhythm: Normal rate and regular rhythm.     Pulses: Normal pulses.     Heart sounds: Normal heart sounds.  Pulmonary:     Effort: Pulmonary effort is normal.     Breath sounds: Normal breath sounds.  Musculoskeletal:     Right lower leg: Edema (1+) present.     Left lower leg: Edema (1+) present.  Neurological:     General: No focal deficit present.     Mental Status: He is alert and oriented to person, place, and time.  Psychiatric:        Mood and Affect: Mood normal.        Behavior: Behavior normal.     LABORATORY DATA:  I have reviewed the labs as listed.  CBC Latest Ref Rng & Units 02/09/2020 01/19/2020 12/29/2019  WBC 4.0 - 10.5 K/uL 2.8(L) 2.1(L) 3.1(L)  Hemoglobin 13.0 - 17.0 g/dL 14.3 14.3 14.5  Hematocrit 39 - 52 % 43.8 44.1 45.0  Platelets 150 - 400 K/uL 161 131(L) 138(L)   CMP Latest Ref Rng & Units 02/09/2020 01/19/2020  12/29/2019  Glucose 70 - 99 mg/dL 107(H) 111(H) 116(H)  BUN 8 - 23 mg/dL 20 19 20   Creatinine 0.61 - 1.24 mg/dL 1.08 0.99 0.96  Sodium 135 - 145 mmol/L 142 141 138  Potassium 3.5 - 5.1 mmol/L 4.7 4.3 4.1  Chloride 98 - 111 mmol/L 106 110 106  CO2 22 - 32 mmol/L 26 24 23   Calcium 8.9 - 10.3 mg/dL 9.5 9.4 9.3  Total Protein 6.5 - 8.1 g/dL 6.1(L) 6.3(L) 6.5  Total Bilirubin 0.3 - 1.2 mg/dL 0.7 0.6 0.7  Alkaline Phos 38 - 126 U/L 61 55 63  AST 15 - 41 U/L 24 23 23   ALT 0 - 44 U/L 27 30 31    Lab Results  Component Value Date   LDH 158 02/09/2020   LDH 144 01/19/2020   LDH 159 12/29/2019    DIAGNOSTIC IMAGING:  I have independently reviewed the scans and discussed with the patient. No results found.   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 -BMBX on 02/25/2019 after cycle 1 did not show any evidence of leukemia. -CT CAP on 02/12/2019 shows numerous bilateral irregular/spiculated pulmonary nodules measuring up to 14 mm, nonspecific. Hepatic steatosis. Spleen is normal.   PLAN:  1. AML: -He has tolerated last treatment very well. -We reviewed his labs today.  White count is low at 2.8 with ANC of 1.5.  Platelets and hemoglobin were normal.  Creatinine is 1.08. -We will proceed with next cycle of decitabine today.  He was told to start taking venetoclax 200 mg daily.  He will continue venetoclax for 2 weeks and stop it. -I plan to see him back in 3 weeks with repeat labs.  2. Arthralgias: -Tramadol every 6 hours is not helping.  He is also using Voltaren gel. -We will start him on hydrocodone 5/325 2-3 times a day as needed.  3. Hypomagnesemia: -Magnesium is 2.0.  Continue magnesium 3 times a day.  4. Hypertension: -Continue Norvasc 2.5 mg daily.  Lisinopril on hold.  5. CKD: -Creatinine is 1.08.  This has improved since lisinopril put on hold.   Orders  placed this encounter:  No orders of the defined types were placed in this encounter.    [name], MD [facility] [phone]   I, Milinda Antis, am acting as a scribe for Dr. Sanda Linger.  I, [name] MD, have reviewed the above documentation for accuracy and completeness, and I agree with the above.

## 2020-02-10 ENCOUNTER — Inpatient Hospital Stay (HOSPITAL_COMMUNITY): Payer: Medicare Other

## 2020-02-10 VITALS — BP 144/88 | HR 70 | Temp 97.0°F | Resp 16

## 2020-02-10 DIAGNOSIS — D46Z Other myelodysplastic syndromes: Secondary | ICD-10-CM

## 2020-02-10 DIAGNOSIS — Z95828 Presence of other vascular implants and grafts: Secondary | ICD-10-CM

## 2020-02-10 DIAGNOSIS — C9200 Acute myeloblastic leukemia, not having achieved remission: Secondary | ICD-10-CM

## 2020-02-10 DIAGNOSIS — Z5111 Encounter for antineoplastic chemotherapy: Secondary | ICD-10-CM | POA: Diagnosis not present

## 2020-02-10 MED ORDER — HEPARIN SOD (PORK) LOCK FLUSH 100 UNIT/ML IV SOLN
500.0000 [IU] | Freq: Once | INTRAVENOUS | Status: AC | PRN
  Administered 2020-02-10: 500 [IU]

## 2020-02-10 MED ORDER — SODIUM CHLORIDE 0.9 % IV SOLN: Freq: Once | INTRAVENOUS | Status: AC

## 2020-02-10 MED ORDER — SODIUM CHLORIDE 0.9% FLUSH
10.0000 mL | INTRAVENOUS | Status: DC | PRN
  Administered 2020-02-10: 10 mL

## 2020-02-10 MED ORDER — SODIUM CHLORIDE 0.9 % IV SOLN
15.0000 mg/m2 | Freq: Once | INTRAVENOUS | Status: AC
  Administered 2020-02-10: 35 mg via INTRAVENOUS
  Filled 2020-02-10: qty 7

## 2020-02-10 MED ORDER — SODIUM CHLORIDE 0.9 % IV SOLN
8.0000 mg | Freq: Once | INTRAVENOUS | Status: AC
  Administered 2020-02-10: 8 mg via INTRAVENOUS
  Filled 2020-02-10: qty 4

## 2020-02-10 NOTE — Patient Instructions (Signed)
[facility] Discharge Instructions for Patients Receiving Chemotherapy  Today you received the following chemotherapy agents   To help prevent nausea and vomiting after your treatment, we encourage you to take your nausea medication   If you develop nausea and vomiting that is not controlled by your nausea medication, call the clinic.   BELOW ARE SYMPTOMS THAT SHOULD BE REPORTED IMMEDIATELY:  *FEVER GREATER THAN 100.5 F  *CHILLS WITH OR WITHOUT FEVER  NAUSEA AND VOMITING THAT IS NOT CONTROLLED WITH YOUR NAUSEA MEDICATION  *UNUSUAL SHORTNESS OF BREATH  *UNUSUAL BRUISING OR BLEEDING  TENDERNESS IN MOUTH AND THROAT WITH OR WITHOUT PRESENCE OF ULCERS  *URINARY PROBLEMS  *BOWEL PROBLEMS  UNUSUAL RASH Items with * indicate a potential emergency and should be followed up as soon as possible.  Feel free to call the clinic should you have any questions or concerns. The clinic phone number is [phone].  Please show the CHEMO ALERT CARD at check-in to the Emergency Department and triage nurse.   

## 2020-02-10 NOTE — Progress Notes (Signed)
Treatment given per orders. Patient tolerated it well without problems. Vitals stable and discharged home from clinic ambulatory with walker in stable condition. Follow up as scheduled.

## 2020-02-11 ENCOUNTER — Other Ambulatory Visit (HOSPITAL_COMMUNITY): Payer: Self-pay | Admitting: *Deleted

## 2020-02-11 ENCOUNTER — Inpatient Hospital Stay (HOSPITAL_COMMUNITY): Payer: Medicare Other

## 2020-02-11 ENCOUNTER — Other Ambulatory Visit: Payer: Self-pay

## 2020-02-11 VITALS — BP 140/80 | HR 68 | Temp 98.1°F | Resp 18

## 2020-02-11 DIAGNOSIS — Z95828 Presence of other vascular implants and grafts: Secondary | ICD-10-CM

## 2020-02-11 DIAGNOSIS — D46Z Other myelodysplastic syndromes: Secondary | ICD-10-CM

## 2020-02-11 DIAGNOSIS — C9200 Acute myeloblastic leukemia, not having achieved remission: Secondary | ICD-10-CM

## 2020-02-11 DIAGNOSIS — Z5111 Encounter for antineoplastic chemotherapy: Secondary | ICD-10-CM | POA: Diagnosis not present

## 2020-02-11 MED ORDER — SODIUM CHLORIDE 0.9 % IV SOLN
8.0000 mg | Freq: Once | INTRAVENOUS | Status: AC
  Administered 2020-02-11: 8 mg via INTRAVENOUS
  Filled 2020-02-11: qty 4

## 2020-02-11 MED ORDER — SODIUM CHLORIDE 0.9 % IV SOLN
15.0000 mg/m2 | Freq: Once | INTRAVENOUS | Status: AC
  Administered 2020-02-11: 35 mg via INTRAVENOUS
  Filled 2020-02-11: qty 7

## 2020-02-11 MED ORDER — TRAMADOL HCL 100 MG PO TABS
1.0000 | ORAL_TABLET | Freq: Four times a day (QID) | ORAL | 0 refills | Status: DC | PRN
Start: 2020-02-11 — End: 2020-03-03

## 2020-02-11 MED ORDER — HEPARIN SOD (PORK) LOCK FLUSH 100 UNIT/ML IV SOLN
500.0000 [IU] | Freq: Once | INTRAVENOUS | Status: AC | PRN
  Administered 2020-02-11: 500 [IU]

## 2020-02-11 MED ORDER — SODIUM CHLORIDE 0.9 % IV SOLN: Freq: Once | INTRAVENOUS | Status: AC

## 2020-02-11 NOTE — Progress Notes (Signed)
Tolerated infusion w/o adverse reaction.  Alert, in no distress.  VSS.  Discharged ambulatory in stable condition.  

## 2020-02-12 ENCOUNTER — Inpatient Hospital Stay (HOSPITAL_COMMUNITY): Payer: Medicare Other

## 2020-02-12 VITALS — BP 145/89 | HR 70 | Temp 96.9°F | Resp 18

## 2020-02-12 DIAGNOSIS — C9200 Acute myeloblastic leukemia, not having achieved remission: Secondary | ICD-10-CM

## 2020-02-12 DIAGNOSIS — D46Z Other myelodysplastic syndromes: Secondary | ICD-10-CM

## 2020-02-12 DIAGNOSIS — Z5111 Encounter for antineoplastic chemotherapy: Secondary | ICD-10-CM | POA: Diagnosis not present

## 2020-02-12 DIAGNOSIS — Z95828 Presence of other vascular implants and grafts: Secondary | ICD-10-CM

## 2020-02-12 MED ORDER — SODIUM CHLORIDE 0.9% FLUSH
10.0000 mL | INTRAVENOUS | Status: DC | PRN
  Administered 2020-02-12: 10 mL

## 2020-02-12 MED ORDER — SODIUM CHLORIDE 0.9 % IV SOLN
8.0000 mg | Freq: Once | INTRAVENOUS | Status: AC
  Administered 2020-02-12: 8 mg via INTRAVENOUS
  Filled 2020-02-12: qty 4

## 2020-02-12 MED ORDER — SODIUM CHLORIDE 0.9 % IV SOLN: Freq: Once | INTRAVENOUS | Status: AC

## 2020-02-12 MED ORDER — SODIUM CHLORIDE 0.9 % IV SOLN
15.0000 mg/m2 | Freq: Once | INTRAVENOUS | Status: AC
  Administered 2020-02-12: 35 mg via INTRAVENOUS
  Filled 2020-02-12: qty 7

## 2020-02-12 MED ORDER — HEPARIN SOD (PORK) LOCK FLUSH 100 UNIT/ML IV SOLN
500.0000 [IU] | Freq: Once | INTRAVENOUS | Status: AC | PRN
  Administered 2020-02-12: 500 [IU]

## 2020-02-12 NOTE — Progress Notes (Signed)
tolerated treatment well today without incidence.  Vital signs stable prior to discharge.  Discharged in stable condition ambulatory with walker.

## 2020-02-12 NOTE — Patient Instructions (Signed)
[facility]  Discharge Instructions:   _______________________________________________________________  Thank you for choosing [facility] at [facility] to provide your oncology and hematology care.  To afford each patient quality time with our providers, please arrive at least 15 minutes before your scheduled appointment.  You need to re-schedule your appointment if you arrive 10 or more minutes late.  We strive to give you quality time with our providers, and arriving late affects you and other patients whose appointments are after yours.  Also, if you no show three or more times for appointments you may be dismissed from the clinic.  Again, thank you for choosing [facility] at [facility]. Our hope is that these requests will allow you access to exceptional care and in a timely manner. _______________________________________________________________  If you have questions after your visit, please contact our office at [phone] between the hours of 8:30 a.m. and 5:00 p.m. Voicemails left after 4:30 p.m. will not be returned until the following business day. _______________________________________________________________  For prescription refill requests, have your pharmacy contact our office. _______________________________________________________________  Recommendations made by the consultant and any test results will be sent to your referring physician. _______________________________________________________________ 

## 2020-02-13 ENCOUNTER — Inpatient Hospital Stay (HOSPITAL_COMMUNITY): Payer: Medicare Other

## 2020-02-13 ENCOUNTER — Other Ambulatory Visit: Payer: Self-pay

## 2020-02-13 VITALS — BP 120/67 | HR 70 | Temp 96.9°F | Resp 18

## 2020-02-13 DIAGNOSIS — Z95828 Presence of other vascular implants and grafts: Secondary | ICD-10-CM

## 2020-02-13 DIAGNOSIS — C9200 Acute myeloblastic leukemia, not having achieved remission: Secondary | ICD-10-CM

## 2020-02-13 DIAGNOSIS — Z5111 Encounter for antineoplastic chemotherapy: Secondary | ICD-10-CM | POA: Diagnosis not present

## 2020-02-13 DIAGNOSIS — D46Z Other myelodysplastic syndromes: Secondary | ICD-10-CM

## 2020-02-13 MED ORDER — SODIUM CHLORIDE 0.9% FLUSH
10.0000 mL | INTRAVENOUS | Status: DC | PRN
  Administered 2020-02-13: 10 mL

## 2020-02-13 MED ORDER — SODIUM CHLORIDE 0.9 % IV SOLN: Freq: Once | INTRAVENOUS | Status: AC

## 2020-02-13 MED ORDER — HEPARIN SOD (PORK) LOCK FLUSH 100 UNIT/ML IV SOLN
500.0000 [IU] | Freq: Once | INTRAVENOUS | Status: AC | PRN
  Administered 2020-02-13: 500 [IU]

## 2020-02-13 MED ORDER — SODIUM CHLORIDE 0.9 % IV SOLN
15.0000 mg/m2 | Freq: Once | INTRAVENOUS | Status: AC
  Administered 2020-02-13: 35 mg via INTRAVENOUS
  Filled 2020-02-13: qty 7

## 2020-02-13 MED ORDER — SODIUM CHLORIDE 0.9 % IV SOLN
8.0000 mg | Freq: Once | INTRAVENOUS | Status: AC
  Administered 2020-02-13: 8 mg via INTRAVENOUS
  Filled 2020-02-13: qty 4

## 2020-02-13 NOTE — Progress Notes (Signed)
Treatment given per orders. Patient tolerated it well without problems. Vitals stable and discharged home from clinic ambulatory in stable condition. Follow up as scheduled.  

## 2020-02-13 NOTE — Patient Instructions (Signed)
[facility] Discharge Instructions for Patients Receiving Chemotherapy  Today you received the following chemotherapy agents   To help prevent nausea and vomiting after your treatment, we encourage you to take your nausea medication   If you develop nausea and vomiting that is not controlled by your nausea medication, call the clinic.   BELOW ARE SYMPTOMS THAT SHOULD BE REPORTED IMMEDIATELY:  *FEVER GREATER THAN 100.5 F  *CHILLS WITH OR WITHOUT FEVER  NAUSEA AND VOMITING THAT IS NOT CONTROLLED WITH YOUR NAUSEA MEDICATION  *UNUSUAL SHORTNESS OF BREATH  *UNUSUAL BRUISING OR BLEEDING  TENDERNESS IN MOUTH AND THROAT WITH OR WITHOUT PRESENCE OF ULCERS  *URINARY PROBLEMS  *BOWEL PROBLEMS  UNUSUAL RASH Items with * indicate a potential emergency and should be followed up as soon as possible.  Feel free to call the clinic should you have any questions or concerns. The clinic phone number is [phone].  Please show the CHEMO ALERT CARD at check-in to the Emergency Department and triage nurse.   

## 2020-02-20 ENCOUNTER — Encounter (HOSPITAL_COMMUNITY): Payer: Self-pay

## 2020-02-20 ENCOUNTER — Other Ambulatory Visit (HOSPITAL_COMMUNITY): Payer: Self-pay | Admitting: *Deleted

## 2020-02-20 DIAGNOSIS — C9200 Acute myeloblastic leukemia, not having achieved remission: Secondary | ICD-10-CM

## 2020-02-26 ENCOUNTER — Other Ambulatory Visit (HOSPITAL_COMMUNITY): Payer: Self-pay

## 2020-02-26 MED ORDER — HYDROCODONE-ACETAMINOPHEN 5-325 MG PO TABS
1.0000 | ORAL_TABLET | Freq: Four times a day (QID) | ORAL | 0 refills | Status: DC | PRN
Start: 2020-02-26 — End: 2020-03-15

## 2020-03-02 NOTE — Progress Notes (Signed)
[place] [address] [place] [address]   CLINIC:  Medical Oncology/Hematology  PCP:  [name], MD [address] [place] [address]  [phone]  REASON FOR VISIT:  Follow-up for AML  PRIOR THERAPY: None  CURRENT THERAPY: Decitabine every 6 weeks and venetoclax daily  INTERVAL HISTORY:  [name], a 74 y.o. 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 Chris was last seen on 02/09/2020.  He reports that his energy levels are okay.  His joint pains are controlled with hydrocodone 3-4 times a day.  He is accompanied by his wife.  Energy levels are low but appetite is 100%.  Wife reports that he has been more forgetful and easily agitated.    REVIEW OF SYSTEMS:  Review of Systems  Musculoskeletal: Positive for arthralgias.  All other systems reviewed and are negative.   PAST MEDICAL/SURGICAL HISTORY:  Past Medical History:  Diagnosis Date  . Arthritis   . 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 Cancer ([place]) 01-28-2014   skin cancer  . CKD (chronic kidney disease), stage III ([place])   . GERD (gastroesophageal reflux disease)   . Heart murmur    NOTED DURING PHYSICAL WHEN HE WAS ENLISTING IN [facility] , DIDNT KNOW UNTIL THAT TIME AND REPORTS , "THATS THE LAST I HEARD ABOUT IT "   . History of hypertension    no longer issue  . History of kidney stones   . History of malignant melanoma of skin    excision top of scalp 2015-- no recurrence  . History of urinary retention    post op lumbar fusion surgery 04/ 2016  . Hypertension   . 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 Left ureteral calculus   . Seasonal allergies   . Wears glasses   . Wears glasses   . Wears partial dentures    upper and lower   Past Surgical History:  Procedure Laterality Date  . ANKLE FUSION Right 2007  . CARPAL TUNNEL RELEASE Left 12/28/2009   w/ pulley release left  long finger  . CARPAL TUNNEL RELEASE Right 07/22/2013   Procedure: RIGHT CARPAL TUNNEL RELEASE;  Surgeon: Cammie Sickle., MD;  Location: [facility];  Service: Orthopedics;  Laterality: Right;  . COLONOSCOPY    . CYSTO/  LEFT RETROGRADE PYELOGRAM  11/21/2010  . CYSTOSCOPY WITH STENT PLACEMENT Left 03/09/2016   Procedure: CYSTOSCOPY WITH STENT PLACEMENT;  Surgeon: Franchot Gallo, MD;  Location: [facility];  Service: Urology;  Laterality: Left;  . CYSTOSCOPY/RETROGRADE/URETEROSCOPY/STONE EXTRACTION WITH BASKET Left 03/09/2016   Procedure: CYSTOSCOPY/RETROGRADE/URETEROSCOPY/STONE EXTRACTION WITH BASKET;  Surgeon: Franchot Gallo, MD;  Location: [facility];  Service: Urology;  Laterality: Left;  . LEFT URETEROSCOPIC LASER LITHOTRIPSY STONE EXTRACTION/ STENT PLACEMENT  05/23/2010  . MOHS SURGERY     TOP OF THE HEAD   . ORIF ANKLE FRACTURE Right 1978  . PORT-A-CATH REMOVAL Right 02/14/2019   Procedure: MINOR REMOVAL PORT-A-CATH;  Surgeon: Aviva Signs, MD;  Location: [facility];  Service: General;  Laterality: Right;  . PORTACATH PLACEMENT Right 08/19/2018   Procedure: INSERTION PORT-A-CATH (attached catheter in right subclavian);  Surgeon: Aviva Signs, MD;  Location: [facility];  Service: General;  Laterality: Right;  . PORTACATH PLACEMENT Left 05/16/2019   Procedure: INSERTION PORT-A-CATH (attached catheter in left subclavian);  Surgeon: Aviva Signs, MD;  Location: [facility];  Service: General;  Laterality: Left;  . POSTERIOR LUMBAR FUSION  08/21/2014   laminectomy and decompression L2 -- L5  . RIGHT LOWER LEG SURGERY  X3  1975 to 1976   including ORIF  . TONSILLECTOMY AND ADENOIDECTOMY  1986  . UMBILICAL HERNIA REPAIR  2009 approx    SOCIAL HISTORY:  Social History   Socioeconomic History  . Marital status: Married    Spouse name: Not on file  . Number of children: Not on file  . Years of education: Not on file  . Highest education level: Not  on file  Occupational History  . Not on file  Tobacco Use  . Smoking status: Former Smoker    Years: 20.00    Types: Cigarettes    Quit date: 07/17/1986    Years since quitting: 33.6  . Smokeless tobacco: Never Used  Vaping Use  . Vaping Use: Never used  Substance and Sexual Activity  . Alcohol use: Yes    Alcohol/week: 7.0 - 14.0 standard drinks    Types: 7 - 14 Cans of beer per week    Comment: 1 -2 beer daily  . Drug use: No  . Sexual activity: Not Currently  Other Topics Concern  . Not on file  Social History Narrative  . Not on file   Social Determinants of Health   Financial Resource Strain:   . Difficulty of Paying Living Expenses: Not on file  Food Insecurity:   . Worried About Charity fundraiser in the Last Year: Not on file  . Ran Out of Food in the Last Year: Not on file  Transportation Needs:   . Lack of Transportation (Medical): Not on file  . Lack of Transportation (Non-Medical): Not on file  Physical Activity:   . Days of Exercise per Week: Not on file  . Minutes of Exercise per Session: Not on file  Stress:   . Feeling of Stress : Not on file  Social Connections:   . Frequency of Communication with Friends and Family: Not on file  . Frequency of Social Gatherings with Friends and Family: Not on file  . Attends Religious Services: Not on file  . Active Member of Clubs or Organizations: Not on file  . Attends Archivist Meetings: Not on file  . Marital Status: Not on file  Intimate Partner Violence:   . Fear of Current or Ex-Partner: Not on file  . Emotionally Abused: Not on file  . Physically Abused: Not on file  . Sexually Abused: Not on file    FAMILY HISTORY:  Family History  Problem Relation Age of Onset  . Stroke Mother   . Prostate cancer Father   . Bone cancer Father   . Diverticulitis Father   . Rheum arthritis Sister   . Urinary tract infection Sister   . Colon cancer Neg Hx     CURRENT MEDICATIONS:  Current  Outpatient Medications  Medication Sig Dispense Refill  . acetaminophen (TYLENOL) 500 MG tablet Take 1,000 mg by mouth 3 (three) times daily.     Marland Kitchen allopurinol (ZYLOPRIM) 300 MG tablet Take 300 mg by mouth at bedtime.     Marland Kitchen amLODipine (NORVASC) 2.5 MG tablet Take 1 tablet (2.5 mg total) by mouth daily. 90 tablet 2  . cetirizine (ZYRTEC) 10 MG tablet Take 10 mg by mouth daily. IN THE MORNING    . Cholecalciferol (VITAMIN D3) 1.25 MG (50000 UT) CAPS Take 1 capsule by mouth  daily.    . diclofenac Sodium (VOLTAREN) 1 % GEL Apply three times daily to knees as needed for pain 50 g 3  . docusate sodium (COLACE) 100 MG capsule Take 100 mg by mouth at bedtime.     Marland Kitchen HYDROcodone-acetaminophen (NORCO) 5-325 MG tablet Take 1 tablet by mouth every 6 (six) hours as needed for moderate pain. 120 tablet 0  . lansoprazole (PREVACID) 15 MG capsule Take 15 mg by mouth daily.     Marland Kitchen lidocaine-prilocaine (EMLA) cream Apply 1 application topically See admin instructions. ONE HOUR PRIOR TO CHEMOTHERAPY APPOINTMENT (Patient not taking: Reported on 01/19/2020)    . magnesium oxide (MAG-OX) 400 (241.3 Mg) MG tablet TAKE 1 TABLET BY MOUTH THREE TIMES DAILY 90 tablet 3  . tamsulosin (FLOMAX) 0.4 MG CAPS capsule Take 0.4 mg by mouth every evening.     . traMADol HCl 100 MG TABS Take 1 tablet by mouth every 6 (six) hours as needed. 120 tablet 0  . VENCLEXTA 100 MG TABS TAKE 2 TABLETS (200 MG) BY MOUTH DAILY 60 tablet 0   No current facility-administered medications for this visit.   Facility-Administered Medications Ordered in Other Visits  Medication Dose Route Frequency Provider Last Rate Last Admin  . sodium chloride flush (NS) 0.9 % injection 10 mL  10 mL Intracatheter PRN [name], MD   10 mL at 11/21/19 1030  . sodium chloride flush (NS) 0.9 % injection 20 mL  20 mL Intravenous PRN [name], MD   20 mL at 07/21/19 1053    ALLERGIES:  No Known Allergies  PHYSICAL EXAM:  Performance status  (ECOG): 1 - Symptomatic but completely ambulatory  There were no vitals filed for this visit. Wt Readings from Last 3 Encounters:  02/09/20 227 lb 11.2 oz (103.3 kg)  01/19/20 231 lb (104.8 kg)  12/29/19 230 lb 3.2 oz (104.4 kg)   Physical Exam Vitals reviewed.  Constitutional:      Appearance: Normal appearance.  Cardiovascular:     Rate and Rhythm: Normal rate and regular rhythm.     Heart sounds: Normal heart sounds.  Pulmonary:     Effort: Pulmonary effort is normal.     Breath sounds: Normal breath sounds.  Chest:     Comments: Port-a-Cath in L chest Abdominal:     General: There is no distension.     Palpations: Abdomen is soft. There is no mass.  Musculoskeletal:        General: No swelling.  Skin:    General: Skin is warm.  Neurological:     General: No focal deficit present.     Mental Status: He is alert and oriented to person, place, and time.  Psychiatric:        Mood and Affect: Mood normal.        Behavior: Behavior normal.     LABORATORY DATA:  I have reviewed the labs as listed.  CBC Latest Ref Rng & Units 02/09/2020 01/19/2020 12/29/2019  WBC 4.0 - 10.5 K/uL 2.8(L) 2.1(L) 3.1(L)  Hemoglobin 13.0 - 17.0 g/dL 14.3 14.3 14.5  Hematocrit 39 - 52 % 43.8 44.1 45.0  Platelets 150 - 400 K/uL 161 131(L) 138(L)   CMP Latest Ref Rng & Units 02/09/2020 01/19/2020 12/29/2019  Glucose 70 - 99 mg/dL 107(H) 111(H) 116(H)  BUN 8 - 23 mg/dL 20 19 20   Creatinine 0.61 - 1.24 mg/dL 1.08 0.99 0.96  Sodium 135 - 145 mmol/L 142 141 138  Potassium 3.5 - 5.1 mmol/L  4.7 4.3 4.1  Chloride 98 - 111 mmol/L 106 110 106  CO2 22 - 32 mmol/L 26 24 23   Calcium 8.9 - 10.3 mg/dL 9.5 9.4 9.3  Total Protein 6.5 - 8.1 g/dL 6.1(L) 6.3(L) 6.5  Total Bilirubin 0.3 - 1.2 mg/dL 0.7 0.6 0.7  Alkaline Phos 38 - 126 U/L 61 55 63  AST 15 - 41 U/L 24 23 23   ALT 0 - 44 U/L 27 30 31       Component Value Date/Time   RBC 4.28 02/09/2020 1019   MCV 102.3 (H) 02/09/2020 1019   MCH 33.4 02/09/2020  1019   MCHC 32.6 02/09/2020 1019   RDW 15.2 02/09/2020 1019   LYMPHSABS 0.8 02/09/2020 1019   MONOABS 0.4 02/09/2020 1019   EOSABS 0.1 02/09/2020 1019   BASOSABS 0.0 02/09/2020 1019    DIAGNOSTIC IMAGING:  I have independently reviewed the scans and discussed with the patient. No results found.   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 -BMBX on 02/25/2019 after cycle 1 did not show any evidence of leukemia. -CT CAP on 02/12/2019 shows numerous bilateral irregular/spiculated pulmonary nodules measuring up to 14 mm, nonspecific. Hepatic steatosis. Spleen is normal.   PLAN:  1. AML: -He is 3 weeks status post last cycle of decitabine.  He completed venetoclax 1 week ago. -Reviewed labs.  White count is 1.8 and platelet count 178.  ANC is 900.  LFTs are normal. -We will come back in 3 weeks to initiate his next cycle. -I have talked to Dr. Blane Ohara at [facility] per patient request.  There are clinical trials available upon progression of his AML.  There are also some trials available now based on MRD studies.  Patient is not interested in moving to [place] at this time. -RTC 3 weeks.  2. Arthralgias: -Continue hydrocodone 5/325 2-3 times a day as needed.  3. Hypomagnesemia: -Continue magnesium 3 times daily.  4. Hypertension: -Continue Norvasc 2.5 mg daily.  5. CKD: -Creatinine improved to 1.07 since lisinopril put on hold.  6.  Dementia: -Wife reports that he is easily agitated and forgetful.  Patient reports that he is having a lot of stress at home as his wife's son is staying with them.  Wife reports that her son will move out soon. -We will start him on Aricept 5 mg daily and increase it in 4 weeks if needed.  If no help, will consult neurology in [place].  Orders placed this encounter:  No orders of the defined types were placed in this  encounter.    [name], MD [place] [phone]   I, Milinda Antis, am acting as a scribe for Dr. Sanda Linger.  I, [name] MD, have reviewed the above documentation for accuracy and completeness, and I agree with the above.

## 2020-03-03 ENCOUNTER — Other Ambulatory Visit (HOSPITAL_COMMUNITY): Payer: Self-pay | Admitting: *Deleted

## 2020-03-03 ENCOUNTER — Inpatient Hospital Stay (HOSPITAL_COMMUNITY): Payer: Medicare Other

## 2020-03-03 ENCOUNTER — Inpatient Hospital Stay (HOSPITAL_BASED_OUTPATIENT_CLINIC_OR_DEPARTMENT_OTHER): Payer: Medicare Other | Admitting: Hematology

## 2020-03-03 ENCOUNTER — Other Ambulatory Visit: Payer: Self-pay

## 2020-03-03 VITALS — BP 122/78 | HR 80 | Temp 96.6°F | Resp 18 | Wt 231.0 lb

## 2020-03-03 DIAGNOSIS — C9200 Acute myeloblastic leukemia, not having achieved remission: Secondary | ICD-10-CM

## 2020-03-03 DIAGNOSIS — Z5111 Encounter for antineoplastic chemotherapy: Secondary | ICD-10-CM | POA: Diagnosis not present

## 2020-03-03 LAB — CBC WITH DIFFERENTIAL/PLATELET
Basophils Absolute: 0 10*3/uL (ref 0.0–0.1)
Basophils Relative: 2 %
Eosinophils Absolute: 0 10*3/uL (ref 0.0–0.5)
Eosinophils Relative: 0 %
HCT: 42 % (ref 39.0–52.0)
Hemoglobin: 13.5 g/dL (ref 13.0–17.0)
Lymphocytes Relative: 40 %
Lymphs Abs: 0.7 10*3/uL (ref 0.7–4.0)
MCH: 33.3 pg (ref 26.0–34.0)
MCHC: 32.1 g/dL (ref 30.0–36.0)
MCV: 103.7 fL — ABNORMAL HIGH (ref 80.0–100.0)
Monocytes Absolute: 0.1 10*3/uL (ref 0.1–1.0)
Monocytes Relative: 8 %
Neutro Abs: 0.9 10*3/uL — ABNORMAL LOW (ref 1.7–7.7)
Neutrophils Relative %: 50 %
Platelets: 172 10*3/uL (ref 150–400)
RBC: 4.05 MIL/uL — ABNORMAL LOW (ref 4.22–5.81)
RDW: 15.5 % (ref 11.5–15.5)
WBC: 1.8 10*3/uL — ABNORMAL LOW (ref 4.0–10.5)
nRBC: 0 % (ref 0.0–0.2)

## 2020-03-03 LAB — COMPREHENSIVE METABOLIC PANEL
ALT: 32 U/L (ref 0–44)
AST: 24 U/L (ref 15–41)
Albumin: 3.7 g/dL (ref 3.5–5.0)
Alkaline Phosphatase: 61 U/L (ref 38–126)
Anion gap: 6 (ref 5–15)
BUN: 23 mg/dL (ref 8–23)
CO2: 27 mmol/L (ref 22–32)
Calcium: 9.3 mg/dL (ref 8.9–10.3)
Chloride: 105 mmol/L (ref 98–111)
Creatinine, Ser: 1.07 mg/dL (ref 0.61–1.24)
GFR, Estimated: >60 mL/min (ref >60)
Glucose, Bld: 102 mg/dL — ABNORMAL HIGH (ref 70–99)
Potassium: 4.7 mmol/L (ref 3.5–5.1)
Sodium: 138 mmol/L (ref 135–145)
Total Bilirubin: 0.7 mg/dL (ref 0.3–1.2)
Total Protein: 6.4 g/dL — ABNORMAL LOW (ref 6.5–8.1)

## 2020-03-03 LAB — MAGNESIUM: Magnesium: 2.1 mg/dL (ref 1.7–2.4)

## 2020-03-03 LAB — LACTATE DEHYDROGENASE: LDH: 142 U/L (ref 98–192)

## 2020-03-03 MED ORDER — DONEPEZIL HCL 5 MG PO TABS: 5.0000 mg | ORAL_TABLET | Freq: Every day | ORAL | 0 refills | Status: DC

## 2020-03-15 ENCOUNTER — Other Ambulatory Visit (HOSPITAL_COMMUNITY): Payer: Self-pay

## 2020-03-15 DIAGNOSIS — C9200 Acute myeloblastic leukemia, not having achieved remission: Secondary | ICD-10-CM

## 2020-03-15 MED ORDER — HYDROCODONE-ACETAMINOPHEN 5-325 MG PO TABS
1.0000 | ORAL_TABLET | Freq: Four times a day (QID) | ORAL | 0 refills | Status: DC | PRN
Start: 2020-03-15 — End: 2020-03-31

## 2020-03-15 MED ORDER — TAMSULOSIN HCL 0.4 MG PO CAPS: 0.4000 mg | ORAL_CAPSULE | Freq: Every evening | ORAL | 2 refills | Status: DC

## 2020-03-18 ENCOUNTER — Other Ambulatory Visit (HOSPITAL_COMMUNITY): Payer: Self-pay | Admitting: Hematology

## 2020-03-18 DIAGNOSIS — C9200 Acute myeloblastic leukemia, not having achieved remission: Secondary | ICD-10-CM

## 2020-03-22 ENCOUNTER — Inpatient Hospital Stay (HOSPITAL_COMMUNITY): Payer: Medicare Other | Attending: Hematology

## 2020-03-22 ENCOUNTER — Inpatient Hospital Stay (HOSPITAL_COMMUNITY): Payer: Medicare Other

## 2020-03-22 ENCOUNTER — Other Ambulatory Visit: Payer: Self-pay

## 2020-03-22 ENCOUNTER — Inpatient Hospital Stay (HOSPITAL_BASED_OUTPATIENT_CLINIC_OR_DEPARTMENT_OTHER): Payer: Medicare Other | Admitting: Hematology

## 2020-03-22 VITALS — BP 141/63 | HR 71 | Temp 97.3°F | Resp 18

## 2020-03-22 VITALS — BP 128/65 | HR 85 | Temp 97.1°F | Resp 18 | Wt 231.2 lb

## 2020-03-22 DIAGNOSIS — I10 Essential (primary) hypertension: Secondary | ICD-10-CM | POA: Diagnosis not present

## 2020-03-22 DIAGNOSIS — Z79899 Other long term (current) drug therapy: Secondary | ICD-10-CM | POA: Diagnosis not present

## 2020-03-22 DIAGNOSIS — Z5111 Encounter for antineoplastic chemotherapy: Secondary | ICD-10-CM | POA: Diagnosis not present

## 2020-03-22 DIAGNOSIS — C9200 Acute myeloblastic leukemia, not having achieved remission: Secondary | ICD-10-CM | POA: Insufficient documentation

## 2020-03-22 DIAGNOSIS — F0390 Unspecified dementia without behavioral disturbance: Secondary | ICD-10-CM | POA: Insufficient documentation

## 2020-03-22 DIAGNOSIS — N189 Chronic kidney disease, unspecified: Secondary | ICD-10-CM | POA: Diagnosis not present

## 2020-03-22 DIAGNOSIS — Z95828 Presence of other vascular implants and grafts: Secondary | ICD-10-CM

## 2020-03-22 DIAGNOSIS — D46Z Other myelodysplastic syndromes: Secondary | ICD-10-CM

## 2020-03-22 LAB — CBC WITH DIFFERENTIAL/PLATELET
Abs Immature Granulocytes: 0.05 10*3/uL (ref 0.00–0.07)
Basophils Absolute: 0 10*3/uL (ref 0.0–0.1)
Basophils Relative: 1 %
Eosinophils Absolute: 0.1 10*3/uL (ref 0.0–0.5)
Eosinophils Relative: 2 %
HCT: 44.7 % (ref 39.0–52.0)
Hemoglobin: 14.2 g/dL (ref 13.0–17.0)
Immature Granulocytes: 1 %
Lymphocytes Relative: 28 %
Lymphs Abs: 1.1 10*3/uL (ref 0.7–4.0)
MCH: 32.7 pg (ref 26.0–34.0)
MCHC: 31.8 g/dL (ref 30.0–36.0)
MCV: 103 fL — ABNORMAL HIGH (ref 80.0–100.0)
Monocytes Absolute: 0.4 10*3/uL (ref 0.1–1.0)
Monocytes Relative: 11 %
Neutro Abs: 2.2 10*3/uL (ref 1.7–7.7)
Neutrophils Relative %: 57 %
Platelets: 145 10*3/uL — ABNORMAL LOW (ref 150–400)
RBC: 4.34 MIL/uL (ref 4.22–5.81)
RDW: 14.9 % (ref 11.5–15.5)
WBC: 3.8 10*3/uL — ABNORMAL LOW (ref 4.0–10.5)
nRBC: 0 % (ref 0.0–0.2)

## 2020-03-22 LAB — COMPREHENSIVE METABOLIC PANEL
ALT: 34 U/L (ref 0–44)
AST: 27 U/L (ref 15–41)
Albumin: 4 g/dL (ref 3.5–5.0)
Alkaline Phosphatase: 69 U/L (ref 38–126)
Anion gap: 8 (ref 5–15)
BUN: 24 mg/dL — ABNORMAL HIGH (ref 8–23)
CO2: 27 mmol/L (ref 22–32)
Calcium: 9.6 mg/dL (ref 8.9–10.3)
Chloride: 106 mmol/L (ref 98–111)
Creatinine, Ser: 1.19 mg/dL (ref 0.61–1.24)
GFR, Estimated: >60 mL/min (ref >60)
Glucose, Bld: 104 mg/dL — ABNORMAL HIGH (ref 70–99)
Potassium: 4.9 mmol/L (ref 3.5–5.1)
Sodium: 141 mmol/L (ref 135–145)
Total Bilirubin: 0.6 mg/dL (ref 0.3–1.2)
Total Protein: 6.5 g/dL (ref 6.5–8.1)

## 2020-03-22 LAB — MAGNESIUM: Magnesium: 1.9 mg/dL (ref 1.7–2.4)

## 2020-03-22 LAB — LACTATE DEHYDROGENASE: LDH: 160 U/L (ref 98–192)

## 2020-03-22 MED ORDER — SODIUM CHLORIDE 0.9 % IV SOLN
8.0000 mg | Freq: Once | INTRAVENOUS | Status: AC
  Administered 2020-03-22: 8 mg via INTRAVENOUS
  Filled 2020-03-22: qty 4

## 2020-03-22 MED ORDER — SODIUM CHLORIDE 0.9 % IV SOLN: Freq: Once | INTRAVENOUS | Status: AC

## 2020-03-22 MED ORDER — HEPARIN SOD (PORK) LOCK FLUSH 100 UNIT/ML IV SOLN: 500.0000 [IU] | Freq: Once | INTRAVENOUS | Status: DC | PRN

## 2020-03-22 MED ORDER — SODIUM CHLORIDE 0.9 % IV SOLN
15.0000 mg/m2 | Freq: Once | INTRAVENOUS | Status: AC
  Administered 2020-03-22: 35 mg via INTRAVENOUS
  Filled 2020-03-22: qty 7

## 2020-03-22 NOTE — Progress Notes (Signed)
[name], [place] [id number]   CLINIC:  Medical Oncology/Hematology  PCP:  [name], MD [address] 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[place])   08/22/2018 - 12/06/2018 Chemotherapy   The patient had palonosetron (ALOXI) injection 0.25 mg, 0.25 mg, Intravenous,  Once, 4 of 6 cycles Administration: 0.25 mg (08/22/2018), 0.25 mg (08/26/2018), 0.25 mg (08/28/2018), 0.25 mg (08/30/2018), 0.25 mg (10/01/2018), 0.25 mg (10/02/2018), 0.25 mg (11/04/2018), 0.25 mg (09/23/2018), 0.25 mg (09/25/2018), 0.25 mg (09/27/2018), 0.25 mg (10/28/2018), 0.25 mg (10/30/2018), 0.25 mg (11/01/2018), 0.25 mg (12/02/2018), 0.25 mg (12/04/2018), 0.25 mg (12/06/2018) azaCITIDine (VIDAZA) 100 mg in sodium chloride 0.9 % 50 mL chemo infusion, 110 mg (66.7 % of original dose 75 mg/m2), Intravenous, Once, 4 of 6 cycles Dose modification: 50 mg/m2 (original dose 75 mg/m2, Cycle 1, Reason: Provider Judgment), 50 mg/m2 (original dose 75 mg/m2, Cycle 2, Reason: Provider Judgment) Administration: 100 mg (08/22/2018), 100 mg (08/23/2018), 100 mg (08/26/2018), 100 mg (08/27/2018), 100 mg (08/28/2018), 100 mg (08/29/2018), 100 mg (08/30/2018), 100 mg (10/01/2018), 100 mg (10/02/2018), 100 mg (11/04/2018), 100 mg (11/05/2018), 100 mg (09/23/2018), 100 mg (09/24/2018), 100 mg (09/25/2018), 100 mg (09/26/2018), 100 mg (09/27/2018), 100 mg (10/28/2018), 100 mg (10/29/2018), 100 mg (10/30/2018), 100 mg (10/31/2018), 100 mg (11/01/2018), 100 mg (12/02/2018), 100 mg (12/03/2018), 100 mg (12/04/2018), 100 mg (12/05/2018), 100 mg (12/06/2018)  for chemotherapy treatment.    01/20/2019 -  Chemotherapy   The patient had  decitabine (DACOGEN) 45 mg in sodium chloride 0.9 % 50 mL chemo infusion, 20 mg/m2 = 45 mg, Intravenous,  Once, 10 of 12 cycles Dose modification: 15 mg/m2 (original dose 20 mg/m2, Cycle 2, Reason: Other (see comments), Comment: neutropenic fever) Administration: 45 mg (01/20/2019), 45 mg (01/21/2019), 45 mg (01/22/2019), 45 mg (01/23/2019), 45 mg (01/24/2019), 35 mg (03/17/2019), 35 mg (03/18/2019), 35 mg (03/19/2019), 35 mg (03/20/2019), 35 mg (03/21/2019), 35 mg (04/22/2019), 35 mg (04/23/2019), 35 mg (04/24/2019), 35 mg (04/25/2019), 35 mg (05/26/2019), 35 mg (05/27/2019), 35 mg (05/28/2019), 35 mg (05/29/2019), 35 mg (05/30/2019), 35 mg (07/07/2019), 35 mg (07/08/2019), 35 mg (07/09/2019), 35 mg (07/10/2019), 35 mg (07/11/2019), 35 mg (08/18/2019), 35 mg (08/19/2019), 35 mg (08/20/2019), 35 mg (08/21/2019), 35 mg (08/22/2019), 35 mg (09/29/2019), 35 mg (09/30/2019), 35 mg (10/01/2019), 35 mg (10/02/2019), 35 mg (10/03/2019), 35 mg (11/17/2019), 35 mg (11/18/2019), 35 mg (11/19/2019), 35 mg (11/20/2019), 35 mg (11/21/2019), 35 mg (12/29/2019), 35 mg (12/30/2019), 35 mg (12/31/2019), 35 mg (01/01/2020), 35 mg (01/02/2020), 35 mg (02/09/2020), 35 mg (02/10/2020), 35 mg (02/11/2020), 35 mg (02/12/2020), 35 mg (02/13/2020) ondansetron (ZOFRAN) 8 mg in sodium chloride 0.9 % 50 mL IVPB, 8 mg (100 % of original dose 8 mg), Intravenous,  Once, 10 of 12 cycles Dose modification: 8 mg (original dose 8 mg, Cycle 1) Administration: 8 mg (01/20/2019), 8 mg (01/21/2019), 8 mg (01/22/2019), 8 mg (01/23/2019), 8 mg (01/24/2019), 8 mg (03/17/2019), 8 mg (03/18/2019), 8 mg (03/19/2019), 8 mg (03/20/2019), 8 mg (03/21/2019), 8 mg (04/22/2019), 8 mg (04/23/2019), 8 mg (04/24/2019), 8 mg (04/25/2019), 8 mg (05/26/2019), 8 mg (05/27/2019), 8 mg (05/28/2019), 8 mg (05/29/2019), 8 mg (05/30/2019), 8 mg (07/07/2019), 8 mg (  07/08/2019), 8 mg (07/09/2019), 8 mg (07/10/2019), 8 mg (07/11/2019), 8 mg (08/18/2019), 8 mg (08/19/2019), 8 mg (08/20/2019), 8 mg (08/21/2019), 8 mg (08/22/2019), 8 mg (09/29/2019),  8 mg (09/30/2019), 8 mg (10/01/2019), 8 mg (10/02/2019), 8 mg (10/03/2019), 8 mg (11/17/2019), 8 mg (11/18/2019), 8 mg (11/19/2019), 8 mg (11/20/2019), 8 mg (11/21/2019), 8 mg (12/29/2019), 8 mg (12/30/2019), 8 mg (12/31/2019), 8 mg (01/01/2020), 8 mg (01/02/2020), 8 mg (02/09/2020), 8 mg (02/10/2020), 8 mg (02/11/2020), 8 mg (02/12/2020), 8 mg (02/13/2020)  for chemotherapy treatment.    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[place])   01/20/2019 -  Chemotherapy   The patient had decitabine (DACOGEN) 45 mg in sodium chloride 0.9 % 50 mL chemo infusion, 20 mg/m2 = 45 mg, Intravenous,  Once, 10 of 12 cycles Dose modification: 15 mg/m2 (original dose 20 mg/m2, Cycle 2, Reason: Other (see comments), Comment: neutropenic fever) Administration: 45 mg (01/20/2019), 45 mg (01/21/2019), 45 mg (01/22/2019), 45 mg (01/23/2019), 45 mg (01/24/2019), 35 mg (03/17/2019), 35 mg (03/18/2019), 35 mg (03/19/2019), 35 mg (03/20/2019), 35 mg (03/21/2019), 35 mg (04/22/2019), 35 mg (04/23/2019), 35 mg (04/24/2019), 35 mg (04/25/2019), 35 mg (05/26/2019), 35 mg (05/27/2019), 35 mg (05/28/2019), 35 mg (05/29/2019), 35 mg (05/30/2019), 35 mg (07/07/2019), 35 mg (07/08/2019), 35 mg (07/09/2019), 35 mg (07/10/2019), 35 mg (07/11/2019), 35 mg (08/18/2019), 35 mg (08/19/2019), 35 mg (08/20/2019), 35 mg (08/21/2019), 35 mg (08/22/2019), 35 mg (09/29/2019), 35 mg (09/30/2019), 35 mg (10/01/2019), 35 mg (10/02/2019), 35 mg (10/03/2019), 35 mg (11/17/2019), 35 mg (11/18/2019), 35 mg (11/19/2019), 35 mg (11/20/2019), 35 mg (11/21/2019), 35 mg (12/29/2019), 35 mg (12/30/2019), 35 mg (12/31/2019), 35 mg (01/01/2020), 35 mg (01/02/2020), 35 mg (02/09/2020), 35 mg (02/10/2020), 35 mg (02/11/2020), 35 mg (02/12/2020), 35 mg (02/13/2020) ondansetron (ZOFRAN) 8 mg in sodium chloride 0.9 % 50 mL IVPB, 8 mg (100 % of original dose 8 mg), Intravenous,  Once, 10 of 12 cycles Dose modification: 8 mg (original dose 8 mg, Cycle 1) Administration: 8 mg  (01/20/2019), 8 mg (01/21/2019), 8 mg (01/22/2019), 8 mg (01/23/2019), 8 mg (01/24/2019), 8 mg (03/17/2019), 8 mg (03/18/2019), 8 mg (03/19/2019), 8 mg (03/20/2019), 8 mg (03/21/2019), 8 mg (04/22/2019), 8 mg (04/23/2019), 8 mg (04/24/2019), 8 mg (04/25/2019), 8 mg (05/26/2019), 8 mg (05/27/2019), 8 mg (05/28/2019), 8 mg (05/29/2019), 8 mg (05/30/2019), 8 mg (07/07/2019), 8 mg (07/08/2019), 8 mg (07/09/2019), 8 mg (07/10/2019), 8 mg (07/11/2019), 8 mg (08/18/2019), 8 mg (08/19/2019), 8 mg (08/20/2019), 8 mg (08/21/2019), 8 mg (08/22/2019), 8 mg (09/29/2019), 8 mg (09/30/2019), 8 mg (10/01/2019), 8 mg (10/02/2019), 8 mg (10/03/2019), 8 mg (11/17/2019), 8 mg (11/18/2019), 8 mg (11/19/2019), 8 mg (11/20/2019), 8 mg (11/21/2019), 8 mg (12/29/2019), 8 mg (12/30/2019), 8 mg (12/31/2019), 8 mg (01/01/2020), 8 mg (01/02/2020), 8 mg (02/09/2020), 8 mg (02/10/2020), 8 mg (02/11/2020), 8 mg (02/12/2020), 8 mg (02/13/2020)  for chemotherapy treatment.      CANCER STAGING: Cancer Staging No matching staging information was found for the patient.  INTERVAL HISTORY:  Richard Wallace, a 74 y.o. male, returns for routine follow-up and consideration for next cycle of chemotherapy. [place] was last seen on 03/03/2020.  Due for cycle #11 of decitabine today.   Today he is accompanied by his wife. Overall, he tells me he has been feeling pretty well. His pain is better controlled with Norco 2 tablets every 6 hours lasting for 3-4 hours and he takes 1 tablet at bedtime. He denies having  N/V/D/C. The Aricept has improved his mood but not his memory. He denies having F/C or recent infections or palpitations.  Overall, he feels ready for next cycle of chemo today.    REVIEW OF SYSTEMS:  Review of Systems  Constitutional: Positive for fatigue (20%). Negative for appetite change.  Cardiovascular: Negative for palpitations.  Gastrointestinal: Negative for constipation.  Musculoskeletal: Positive for arthralgias (5/10-8/10 arthritis in knees & ankles) and back pain  (arthritis in back).  Psychiatric/Behavioral:       Mood improved on Aricept  All other systems reviewed and are negative.   PAST MEDICAL/SURGICAL HISTORY:  Past Medical History:  Diagnosis Date  . Arthritis   . 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 Cancer ([place]) 01-28-2014   skin cancer  . CKD (chronic kidney disease), stage III ([place])   . GERD (gastroesophageal reflux disease)   . Heart murmur    NOTED DURING PHYSICAL WHEN HE WAS ENLISTING IN [facility] , DIDNT KNOW UNTIL THAT TIME AND REPORTS , "THATS THE LAST I HEARD ABOUT IT "   . History of hypertension    no longer issue  . History of kidney stones   . History of malignant melanoma of skin    excision top of scalp 2015-- no recurrence  . History of urinary retention    post op lumbar fusion surgery 04/ 2016  . Hypertension   . 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 Left ureteral calculus   . Seasonal allergies   . Wears glasses   . Wears glasses   . Wears partial dentures    upper and lower   Past Surgical History:  Procedure Laterality Date  . ANKLE FUSION Right 2007  . CARPAL TUNNEL RELEASE Left 12/28/2009   w/ pulley release left long finger  . CARPAL TUNNEL RELEASE Right 07/22/2013   Procedure: RIGHT CARPAL TUNNEL RELEASE;  Surgeon: Cammie Sickle., MD;  Location: [facility];  Service: Orthopedics;  Laterality: Right;  . COLONOSCOPY    . CYSTO/  LEFT RETROGRADE PYELOGRAM  11/21/2010  . CYSTOSCOPY WITH STENT PLACEMENT Left 03/09/2016   Procedure: CYSTOSCOPY WITH STENT PLACEMENT;  Surgeon: Franchot Gallo, MD;  [facility];  Service: Urology;  Laterality: Left;  . CYSTOSCOPY/RETROGRADE/URETEROSCOPY/STONE EXTRACTION WITH BASKET Left 03/09/2016   Procedure: CYSTOSCOPY/RETROGRADE/URETEROSCOPY/STONE EXTRACTION WITH BASKET;  Surgeon: Franchot Gallo, MD;  Location: [facility];   Service: Urology;  Laterality: Left;  . LEFT URETEROSCOPIC LASER LITHOTRIPSY STONE EXTRACTION/ STENT PLACEMENT  05/23/2010  . MOHS SURGERY     TOP OF THE HEAD   . ORIF ANKLE FRACTURE Right 1978  . PORT-A-CATH REMOVAL Right 02/14/2019   Procedure: MINOR REMOVAL PORT-A-CATH;  Surgeon: Aviva Signs, MD;  Location: [facility];  Service: General;  Laterality: Right;  . PORTACATH PLACEMENT Right 08/19/2018   Procedure: INSERTION PORT-A-CATH (attached catheter in right subclavian);  Surgeon: Aviva Signs, MD;  Location: [facility];  Service: General;  Laterality: Right;  . PORTACATH PLACEMENT Left 05/16/2019   Procedure: INSERTION PORT-A-CATH (attached catheter in left subclavian);  Surgeon: Aviva Signs, MD;  Location: [facility];  Service: General;  Laterality: Left;  . POSTERIOR LUMBAR FUSION  08/21/2014   laminectomy and decompression L2 -- L5  . RIGHT LOWER LEG SURGERY  X3  1975 to 1976   including ORIF  . TONSILLECTOMY AND ADENOIDECTOMY  1986  . UMBILICAL HERNIA REPAIR  2009 approx    SOCIAL HISTORY:  Social History   Socioeconomic History  . Marital status: Married    Spouse name: Not on file  . Number of children: Not on file  . Years of education: Not on file  . Highest education level: Not on file  Occupational History  . Not on file  Tobacco Use  . Smoking status: Former Smoker    Years: 20.00    Types: Cigarettes    Quit date: 07/17/1986    Years since quitting: 33.7  . Smokeless tobacco: Never Used  Vaping Use  . Vaping Use: Never used  Substance and Sexual Activity  . Alcohol use: Yes    Alcohol/week: 7.0 - 14.0 standard drinks    Types: 7 - 14 Cans of beer per week    Comment: 1 -2 beer daily  . Drug use: No  . Sexual activity: Not Currently  Other Topics Concern  . Not on file  Social History Narrative  . Not on file   Social Determinants of Health   Financial Resource Strain:   . Difficulty of Paying Living Expenses: Not on file  Food Insecurity:   . Worried About  Charity fundraiser in the Last Year: Not on file  . Ran Out of Food in the Last Year: Not on file  Transportation Needs:   . Lack of Transportation (Medical): Not on file  . Lack of Transportation (Non-Medical): Not on file  Physical Activity:   . Days of Exercise per Week: Not on file  . Minutes of Exercise per Session: Not on file  Stress:   . Feeling of Stress : Not on file  Social Connections:   . Frequency of Communication with Friends and Family: Not on file  . Frequency of Social Gatherings with Friends and Family: Not on file  . Attends Religious Services: Not on file  . Active Member of Clubs or Organizations: Not on file  . Attends Archivist Meetings: Not on file  . Marital Status: Not on file  Intimate Partner Violence:   . Fear of Current or Ex-Partner: Not on file  . Emotionally Abused: Not on file  . Physically Abused: Not on file  . Sexually Abused: Not on file    FAMILY HISTORY:  Family History  Problem Relation Age of Onset  . Stroke Mother   . Prostate cancer Father   . Bone cancer Father   . Diverticulitis Father   . Rheum arthritis Sister   . Urinary tract infection Sister   . Colon cancer Neg Hx     CURRENT MEDICATIONS:  Current Outpatient Medications  Medication Sig Dispense Refill  . acetaminophen (TYLENOL) 500 MG tablet Take 1,000 mg by mouth 3 (three) times daily.     Marland Kitchen allopurinol (ZYLOPRIM) 300 MG tablet Take 300 mg by mouth at bedtime.     Marland Kitchen amLODipine (NORVASC) 2.5 MG tablet Take 1 tablet (2.5 mg total) by mouth daily. 90 tablet 2  . cetirizine (ZYRTEC) 10 MG tablet Take 10 mg by mouth daily. IN THE MORNING    . Cholecalciferol (VITAMIN D3) 1.25 MG (50000 UT) CAPS Take 1 capsule by mouth daily.    . diclofenac Sodium (VOLTAREN) 1 % GEL Apply three times daily to knees as needed for pain 50 g 3  . docusate sodium (COLACE) 100 MG capsule Take 100 mg by mouth at bedtime.     . donepezil (ARICEPT) 5 MG tablet Take 1 tablet (5 mg  total) by mouth at bedtime. 30 tablet 0  . HYDROcodone-acetaminophen (NORCO) 5-325 MG tablet Take 1-2 tablets by mouth every 6 (six) hours as needed for moderate pain. 120 tablet 0  . lansoprazole (PREVACID) 15 MG capsule Take 15 mg by mouth daily.     . magnesium oxide (MAG-OX) 400 (241.3 Mg) MG tablet TAKE 1 TABLET BY MOUTH THREE TIMES DAILY 90 tablet 3  . tamsulosin (FLOMAX) 0.4 MG CAPS capsule Take 1 capsule (0.4 mg total) by mouth every evening. 30 capsule 2  . VENCLEXTA 100 MG tablet TAKE 2 TABLETS (200 MG) BY MOUTH DAILY 60 tablet 0  . lidocaine-prilocaine (EMLA) cream Apply 1 application topically See admin instructions. ONE HOUR PRIOR TO CHEMOTHERAPY APPOINTMENT (Patient not taking: Reported on 03/22/2020)     No current facility-administered medications for this visit.   Facility-Administered Medications Ordered in Other Visits  Medication Dose Route Frequency Provider Last Rate Last Admin  . sodium chloride flush (NS) 0.9 % injection 10 mL  10 mL Intracatheter PRN [name], MD   10 mL at 11/21/19 1030  . sodium chloride flush (NS) 0.9 % injection 20 mL  20 mL Intravenous PRN [name], MD   20 mL at 07/21/19 1053    ALLERGIES:  No Known Allergies  PHYSICAL EXAM:  Performance status (ECOG): 1 - Symptomatic but completely ambulatory  Vitals:   03/22/20 0949  BP: 128/65  Pulse: 85  Resp: 18  Temp: (!) 97.1 F (36.2 C)  SpO2: 96%   Wt Readings from Last 3 Encounters:  03/22/20 231 lb 3.2 oz (104.9 kg)  03/03/20 231 lb 0.7 oz (104.8 kg)  02/09/20 227 lb 11.2 oz (103.3 kg)   Physical Exam Vitals reviewed.  Constitutional:      Appearance: Normal appearance. He is obese.  Cardiovascular:     Rate and Rhythm: Normal rate. Rhythm irregular.     Pulses: Normal pulses.     Heart sounds: Normal heart sounds.  Pulmonary:     Effort: Pulmonary effort is normal.     Breath sounds: Normal breath sounds.  Musculoskeletal:     Right lower leg: No edema.      Left lower leg: No edema.  Neurological:     General: No focal deficit present.     Mental Status: He is alert and oriented to person, place, and time.  Psychiatric:        Mood and Affect: Mood normal.        Behavior: Behavior normal.     LABORATORY DATA:  I have reviewed the labs as listed.  CBC Latest Ref Rng & Units 03/22/2020 03/03/2020 02/09/2020  WBC 4.0 - 10.5 K/uL 3.8(L) 1.8(L) 2.8(L)  Hemoglobin 13.0 - 17.0 g/dL 14.2 13.5 14.3  Hematocrit 39 - 52 % 44.7 42.0 43.8  Platelets 150 - 400 K/uL 145(L) 172 161   CMP Latest Ref Rng & Units 03/22/2020 03/03/2020 02/09/2020  Glucose 70 - 99 mg/dL 104(H) 102(H) 107(H)  BUN 8 - 23 mg/dL 24(H) 23 20  Creatinine 0.61 - 1.24 mg/dL 1.19 1.07 1.08  Sodium 135 - 145 mmol/L 141 138 142  Potassium 3.5 - 5.1 mmol/L 4.9 4.7 4.7  Chloride 98 - 111 mmol/L 106 105 106  CO2 22 - 32 mmol/L 27 27 26   Calcium 8.9 - 10.3 mg/dL 9.6 9.3 9.5  Total Protein 6.5 - 8.1 g/dL 6.5 6.4(L) 6.1(L)  Total Bilirubin 0.3 - 1.2 mg/dL 0.6 0.7 0.7  Alkaline Phos 38 - 126 U/L  69 61 61  AST 15 - 41 U/L 27 24 24   ALT 0 - 44 U/L 34 32 27   Lab Results  Component Value Date   LDH 160 03/22/2020   LDH 142 03/03/2020   LDH 158 02/09/2020    DIAGNOSTIC IMAGING:  I have independently reviewed the scans and discussed with the patient. No results found.   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 -BMBX on 02/25/2019 after cycle 1 did not show any evidence of leukemia. -CT CAP on 02/12/2019 shows numerous bilateral irregular/spiculated pulmonary nodules measuring up to 14 mm, nonspecific. Hepatic steatosis. Spleen is normal. -I have talked to Dr.Ravandi at [facility] per patient request.  There are clinical trials available upon progression of his AML.  There are also some clinical trials available now based on MRD positivity.  Patient not interested in  moving to [place] at this time.   PLAN:  1. AML: -Reviewed his labs today.  White count is 3.8 with platelet count 145.  ANC is 2.2.  Hemoglobin normal.  LDH is 160.  LFTs are normal. -I have recommended proceeding with his next cycle today. -He will start venetoclax 200 mg this evening.  He will take venetoclax for 2 weeks and stop it. -I plan to see him back in 6 weeks for follow-up.  2. Arthralgias: -He is taking hydrocodone 5/325 2 tablets in the morning, 2 tablets at noon, 2 tablets in the evening and 1 tablet at bedtime which is helping.  He denies any constipation.  3. Hypomagnesemia: -Continue magnesium 3 times a day.  Magnesium is 1.9.  4. Hypertension: -Continue Norvasc 2.5 mg daily.  5. CKD: -Creatinine improved to 1.19 since lisinopril on hold.  6.  Dementia: -Because of agitation and forgetfulness, we started him on Aricept 5 mg daily at last visit 2 weeks ago. -Wife reports that his agitation has improved quite a bit.  Continue Aricept at the same dose.   Orders placed this encounter:  No orders of the defined types were placed in this encounter.    [name], MD [place] [phone]   I, Milinda Antis, am acting as a scribe for Dr. Sanda Linger.  I, [name] MD, have reviewed the above documentation for accuracy and completeness, and I agree with the above.

## 2020-03-22 NOTE — Patient Instructions (Signed)
[place] at [facility] Discharge Instructions  You were seen today by Dr. Delton Coombes. He went over your recent results. You received your treatment today; start taking venetoclax 2 tablets today and take for 2 weeks. Dr. Delton Coombes will see you back in 6 weeks for labs and follow up.   Thank you for choosing [place] at [facility] to provide your oncology and hematology care.  To afford each patient quality time with our provider, please arrive at least 15 minutes before your scheduled appointment time.   If you have a lab appointment with the [facility] please come in thru the Main Entrance and check in at the main information desk  You need to re-schedule your appointment should you arrive 10 or more minutes late.  We strive to give you quality time with our providers, and arriving late affects you and other patients whose appointments are after yours.  Also, if you no show three or more times for appointments you may be dismissed from the clinic at the providers discretion.     Again, thank you for choosing [facility].  Our hope is that these requests will decrease the amount of time that you wait before being seen by our physicians.       _____________________________________________________________  Should you have questions after your visit to [facility], please contact our office at [phone] between the hours of 8:00 a.m. and 4:30 p.m.  Voicemails left after 4:00 p.m. will not be returned until the following business day.  For prescription refill requests, have your pharmacy contact our office and allow 72 hours.    Cancer Center Support Programs:   > Cancer Support Group  2nd Tuesday of the month 1pm-2pm, Journey Room

## 2020-03-22 NOTE — Progress Notes (Signed)
Patient was assessed by Dr. Delton Coombes and labs have been reviewed.  Patient is okay to proceed with treatment today. Primary RN and pharmacy aware.   Patient has been taking his venetoclax as scheduled.  He hasn't missed any doses and reports no side effects.  He will begin taking it again today for 14 days and then stop until his next treatment cycle.

## 2020-03-22 NOTE — Progress Notes (Signed)
Patient has been assessed, vital signs and labs have been reviewed by Dr. Delton Coombes. ANC, Creatinine, LFTs, hemoglobin, and platelets are within treatment parameters per Dr. Delton Coombes. The patient is okay to proceed with treatment at this time per MD.   Tolerated infusion w/o adverse reaction.  Alert, in no distress.  VSS.  Discharged ambulatory in stable condition.

## 2020-03-23 ENCOUNTER — Encounter (HOSPITAL_COMMUNITY): Payer: Self-pay | Admitting: General Practice

## 2020-03-23 ENCOUNTER — Inpatient Hospital Stay (HOSPITAL_COMMUNITY): Payer: Medicare Other

## 2020-03-23 ENCOUNTER — Inpatient Hospital Stay (HOSPITAL_COMMUNITY): Payer: Medicare Other | Admitting: General Practice

## 2020-03-23 VITALS — BP 128/81 | HR 67 | Temp 98.0°F | Resp 18

## 2020-03-23 DIAGNOSIS — C9200 Acute myeloblastic leukemia, not having achieved remission: Secondary | ICD-10-CM

## 2020-03-23 DIAGNOSIS — D46Z Other myelodysplastic syndromes: Secondary | ICD-10-CM

## 2020-03-23 DIAGNOSIS — Z95828 Presence of other vascular implants and grafts: Secondary | ICD-10-CM

## 2020-03-23 DIAGNOSIS — Z5111 Encounter for antineoplastic chemotherapy: Secondary | ICD-10-CM | POA: Diagnosis not present

## 2020-03-23 MED ORDER — HEPARIN SOD (PORK) LOCK FLUSH 100 UNIT/ML IV SOLN
500.0000 [IU] | Freq: Once | INTRAVENOUS | Status: AC | PRN
  Administered 2020-03-23: 500 [IU]

## 2020-03-23 MED ORDER — SODIUM CHLORIDE 0.9 % IV SOLN: Freq: Once | INTRAVENOUS | Status: AC

## 2020-03-23 MED ORDER — SODIUM CHLORIDE 0.9% FLUSH
10.0000 mL | INTRAVENOUS | Status: DC | PRN
  Administered 2020-03-23: 10 mL

## 2020-03-23 MED ORDER — SODIUM CHLORIDE 0.9 % IV SOLN
15.0000 mg/m2 | Freq: Once | INTRAVENOUS | Status: AC
  Administered 2020-03-23: 35 mg via INTRAVENOUS
  Filled 2020-03-23: qty 7

## 2020-03-23 MED ORDER — SODIUM CHLORIDE 0.9 % IV SOLN
8.0000 mg | Freq: Once | INTRAVENOUS | Status: AC
  Administered 2020-03-23: 8 mg via INTRAVENOUS
  Filled 2020-03-23: qty 4

## 2020-03-23 NOTE — Progress Notes (Signed)
Patient presents today for treatment. Vital signs within parameters.  No new complaints.  Treatment given today per MD orders.  Tolerated infusion without adverse affects.  Vital signs stable.  No complaints at this time.  Discharge from clinic ambulatory in stable condition.  Alert and oriented X 3.  Follow up with [facility] as scheduled.

## 2020-03-23 NOTE — Progress Notes (Addendum)
[facility] CSW Progress Notes  Call to wife at request of Jene Every, RN.  Wife reports many issues including husband's terminal illness, her own breast cancer diagnosis and accompanying pain, son and daughter in law are currently living w them which is causing stress.  Son and daughter in law moved here 4 years ago and "it has been stressful for all that time."  They are planning to move into their own home, but there have been snags along the way  Patient and wife are moving to a smaller home - this move is waiting to happen until the son/daughter in law move out.  Wife had neck fused 4 years ago and lost range of motion from that surgery, then found out she had cancer and had left mastectomy.  Has residual nerve pain, back pain, multiple changes to wife's cancer medications due to side effects.  Her sleeping has also been an issue.  Per note, wife has been advised to find a therapist for herself due to multiple psychosocial stressors.  Wife had recent BP reading that was significantly elevated, has sought medical attention.    Patient cannot walk far, significant fatigue.  Uses walker, unsteady on his feet, struggles with some "senility."  Per wife, "he gets angry with the least little thing", irritable, "he is getting more confused."  Discussed need for additional support in order to buffer the significant stress she is under.  Sent email with resources for counseling as well as support groups.  Resources all have virtual options as distance may be a barrier to participation.  Will call in two weeks to assess progress.  Edwyna Shell, LCSW Clinical Social Worker Phone:  [place], [place] Social Worker Phone:  [phone]

## 2020-03-23 NOTE — Patient Instructions (Signed)
[facility] Discharge Instructions for Patients Receiving Chemotherapy  Today you received the following chemotherapy agents   To help prevent nausea and vomiting after your treatment, we encourage you to take your nausea medication   If you develop nausea and vomiting that is not controlled by your nausea medication, call the clinic.   BELOW ARE SYMPTOMS THAT SHOULD BE REPORTED IMMEDIATELY:  *FEVER GREATER THAN 100.5 F  *CHILLS WITH OR WITHOUT FEVER  NAUSEA AND VOMITING THAT IS NOT CONTROLLED WITH YOUR NAUSEA MEDICATION  *UNUSUAL SHORTNESS OF BREATH  *UNUSUAL BRUISING OR BLEEDING  TENDERNESS IN MOUTH AND THROAT WITH OR WITHOUT PRESENCE OF ULCERS  *URINARY PROBLEMS  *BOWEL PROBLEMS  UNUSUAL RASH Items with * indicate a potential emergency and should be followed up as soon as possible.  Feel free to call the clinic should you have any questions or concerns. The clinic phone number is [phone].  Please show the CHEMO ALERT CARD at check-in to the Emergency Department and triage nurse.   

## 2020-03-24 ENCOUNTER — Other Ambulatory Visit (HOSPITAL_COMMUNITY): Payer: Self-pay | Admitting: Hematology

## 2020-03-24 ENCOUNTER — Other Ambulatory Visit: Payer: Self-pay

## 2020-03-24 ENCOUNTER — Inpatient Hospital Stay (HOSPITAL_COMMUNITY): Payer: Medicare Other

## 2020-03-24 VITALS — BP 131/69 | HR 69 | Temp 96.8°F | Resp 18

## 2020-03-24 DIAGNOSIS — C9200 Acute myeloblastic leukemia, not having achieved remission: Secondary | ICD-10-CM

## 2020-03-24 DIAGNOSIS — Z95828 Presence of other vascular implants and grafts: Secondary | ICD-10-CM

## 2020-03-24 DIAGNOSIS — Z5111 Encounter for antineoplastic chemotherapy: Secondary | ICD-10-CM | POA: Diagnosis not present

## 2020-03-24 DIAGNOSIS — D46Z Other myelodysplastic syndromes: Secondary | ICD-10-CM

## 2020-03-24 MED ORDER — SODIUM CHLORIDE 0.9 % IV SOLN
8.0000 mg | Freq: Once | INTRAVENOUS | Status: AC
  Administered 2020-03-24: 8 mg via INTRAVENOUS
  Filled 2020-03-24: qty 4

## 2020-03-24 MED ORDER — SODIUM CHLORIDE 0.9 % IV SOLN: Freq: Once | INTRAVENOUS | Status: AC

## 2020-03-24 MED ORDER — SODIUM CHLORIDE 0.9 % IV SOLN
15.0000 mg/m2 | Freq: Once | INTRAVENOUS | Status: AC
  Administered 2020-03-24: 35 mg via INTRAVENOUS
  Filled 2020-03-24: qty 7

## 2020-03-24 MED ORDER — HEPARIN SOD (PORK) LOCK FLUSH 100 UNIT/ML IV SOLN
500.0000 [IU] | Freq: Once | INTRAVENOUS | Status: AC | PRN
  Administered 2020-03-24: 500 [IU]

## 2020-03-24 MED ORDER — SODIUM CHLORIDE 0.9% FLUSH
10.0000 mL | INTRAVENOUS | Status: DC | PRN
  Administered 2020-03-24 (×2): 10 mL

## 2020-03-24 NOTE — Progress Notes (Signed)
Patient presents today for treatment.  Vital signs within parameters for treatment.  No new complaints.  Treatment given today per MD orders.  Tolerated infusion without adverse affects.  Vital signs stable.  No complaints at this time.  Discharge from clinic ambulatory in stable condition.  Alert and oriented X 3.  Follow up with [facility] as scheduled.

## 2020-03-24 NOTE — Patient Instructions (Signed)
[facility] Discharge Instructions for Patients Receiving Chemotherapy  Today you received the following chemotherapy agents   To help prevent nausea and vomiting after your treatment, we encourage you to take your nausea medication   If you develop nausea and vomiting that is not controlled by your nausea medication, call the clinic.   BELOW ARE SYMPTOMS THAT SHOULD BE REPORTED IMMEDIATELY:  *FEVER GREATER THAN 100.5 F  *CHILLS WITH OR WITHOUT FEVER  NAUSEA AND VOMITING THAT IS NOT CONTROLLED WITH YOUR NAUSEA MEDICATION  *UNUSUAL SHORTNESS OF BREATH  *UNUSUAL BRUISING OR BLEEDING  TENDERNESS IN MOUTH AND THROAT WITH OR WITHOUT PRESENCE OF ULCERS  *URINARY PROBLEMS  *BOWEL PROBLEMS  UNUSUAL RASH Items with * indicate a potential emergency and should be followed up as soon as possible.  Feel free to call the clinic should you have any questions or concerns. The clinic phone number is [phone].  Please show the CHEMO ALERT CARD at check-in to the Emergency Department and triage nurse.   

## 2020-03-25 ENCOUNTER — Inpatient Hospital Stay (HOSPITAL_COMMUNITY): Payer: Medicare Other

## 2020-03-25 ENCOUNTER — Encounter (HOSPITAL_COMMUNITY): Payer: Self-pay

## 2020-03-25 VITALS — BP 130/73 | HR 63 | Temp 97.0°F | Resp 18

## 2020-03-25 DIAGNOSIS — D46Z Other myelodysplastic syndromes: Secondary | ICD-10-CM

## 2020-03-25 DIAGNOSIS — Z95828 Presence of other vascular implants and grafts: Secondary | ICD-10-CM

## 2020-03-25 DIAGNOSIS — C9200 Acute myeloblastic leukemia, not having achieved remission: Secondary | ICD-10-CM

## 2020-03-25 DIAGNOSIS — Z5111 Encounter for antineoplastic chemotherapy: Secondary | ICD-10-CM | POA: Diagnosis not present

## 2020-03-25 MED ORDER — SODIUM CHLORIDE 0.9 % IV SOLN: Freq: Once | INTRAVENOUS | Status: AC

## 2020-03-25 MED ORDER — SODIUM CHLORIDE 0.9 % IV SOLN
8.0000 mg | Freq: Once | INTRAVENOUS | Status: AC
  Administered 2020-03-25: 8 mg via INTRAVENOUS
  Filled 2020-03-25: qty 4

## 2020-03-25 MED ORDER — SODIUM CHLORIDE 0.9% FLUSH
10.0000 mL | INTRAVENOUS | Status: DC | PRN
  Administered 2020-03-25: 10 mL

## 2020-03-25 MED ORDER — HEPARIN SOD (PORK) LOCK FLUSH 100 UNIT/ML IV SOLN
500.0000 [IU] | Freq: Once | INTRAVENOUS | Status: AC | PRN
  Administered 2020-03-25: 500 [IU]

## 2020-03-25 MED ORDER — SODIUM CHLORIDE 0.9 % IV SOLN
15.0000 mg/m2 | Freq: Once | INTRAVENOUS | Status: AC
  Administered 2020-03-25: 35 mg via INTRAVENOUS
  Filled 2020-03-25: qty 7

## 2020-03-25 NOTE — Progress Notes (Signed)
Pt here for day 4. Vital signs stable for treatment today.   Tolerated treatment well today.  Vital signs stable prior to discharge.  Discharged in stable condition ambulatory with walker.

## 2020-03-25 NOTE — Patient Instructions (Signed)
[facility] Discharge Instructions for Patients Receiving Chemotherapy  Today you received the following chemotherapy agents   To help prevent nausea and vomiting after your treatment, we encourage you to take your nausea medication   If you develop nausea and vomiting that is not controlled by your nausea medication, call the clinic.   BELOW ARE SYMPTOMS THAT SHOULD BE REPORTED IMMEDIATELY:  *FEVER GREATER THAN 100.5 F  *CHILLS WITH OR WITHOUT FEVER  NAUSEA AND VOMITING THAT IS NOT CONTROLLED WITH YOUR NAUSEA MEDICATION  *UNUSUAL SHORTNESS OF BREATH  *UNUSUAL BRUISING OR BLEEDING  TENDERNESS IN MOUTH AND THROAT WITH OR WITHOUT PRESENCE OF ULCERS  *URINARY PROBLEMS  *BOWEL PROBLEMS  UNUSUAL RASH Items with * indicate a potential emergency and should be followed up as soon as possible.  Feel free to call the clinic should you have any questions or concerns. The clinic phone number is [phone].  Please show the CHEMO ALERT CARD at check-in to the Emergency Department and triage nurse.   

## 2020-03-26 ENCOUNTER — Other Ambulatory Visit: Payer: Self-pay

## 2020-03-26 ENCOUNTER — Inpatient Hospital Stay (HOSPITAL_COMMUNITY): Payer: Medicare Other

## 2020-03-26 VITALS — BP 139/83 | HR 66 | Temp 97.0°F | Resp 18

## 2020-03-26 DIAGNOSIS — Z95828 Presence of other vascular implants and grafts: Secondary | ICD-10-CM

## 2020-03-26 DIAGNOSIS — D46Z Other myelodysplastic syndromes: Secondary | ICD-10-CM

## 2020-03-26 DIAGNOSIS — Z5111 Encounter for antineoplastic chemotherapy: Secondary | ICD-10-CM | POA: Diagnosis not present

## 2020-03-26 DIAGNOSIS — C9200 Acute myeloblastic leukemia, not having achieved remission: Secondary | ICD-10-CM

## 2020-03-26 MED ORDER — HEPARIN SOD (PORK) LOCK FLUSH 100 UNIT/ML IV SOLN
500.0000 [IU] | Freq: Once | INTRAVENOUS | Status: AC | PRN
  Administered 2020-03-26: 500 [IU]

## 2020-03-26 MED ORDER — SODIUM CHLORIDE 0.9 % IV SOLN: Freq: Once | INTRAVENOUS | Status: AC

## 2020-03-26 MED ORDER — SODIUM CHLORIDE 0.9 % IV SOLN
15.0000 mg/m2 | Freq: Once | INTRAVENOUS | Status: AC
  Administered 2020-03-26: 35 mg via INTRAVENOUS
  Filled 2020-03-26: qty 7

## 2020-03-26 MED ORDER — SODIUM CHLORIDE 0.9% FLUSH
10.0000 mL | INTRAVENOUS | Status: DC | PRN
  Administered 2020-03-26 (×2): 10 mL

## 2020-03-26 MED ORDER — SODIUM CHLORIDE 0.9 % IV SOLN
8.0000 mg | Freq: Once | INTRAVENOUS | Status: AC
  Administered 2020-03-26: 8 mg via INTRAVENOUS
  Filled 2020-03-26: qty 4

## 2020-03-26 NOTE — Progress Notes (Signed)
Patient presents today for treatment.  Vital signs stable.  No new complaints.

## 2020-03-26 NOTE — Patient Instructions (Signed)

## 2020-03-26 NOTE — Progress Notes (Signed)
Treatment given today per MD orders. Tolerated infusion without adverse affects. Vital signs stable. No complaints at this time. Discharged from clinic ambulatory in stable condition. Alert and oriented x 3. F/U with  Cancer Center as scheduled.   

## 2020-03-31 ENCOUNTER — Other Ambulatory Visit (HOSPITAL_COMMUNITY): Payer: Self-pay

## 2020-03-31 ENCOUNTER — Other Ambulatory Visit (HOSPITAL_COMMUNITY): Payer: Self-pay | Admitting: Hematology

## 2020-03-31 MED ORDER — HYDROCODONE-ACETAMINOPHEN 5-325 MG PO TABS
1.0000 | ORAL_TABLET | Freq: Four times a day (QID) | ORAL | 0 refills | Status: DC | PRN
Start: 2020-03-31 — End: 2020-05-18

## 2020-04-06 ENCOUNTER — Inpatient Hospital Stay (HOSPITAL_COMMUNITY): Payer: Medicare Other | Admitting: General Practice

## 2020-04-06 DIAGNOSIS — C9200 Acute myeloblastic leukemia, not having achieved remission: Secondary | ICD-10-CM

## 2020-04-06 NOTE — Progress Notes (Signed)
[facility] CSW Progress Notes   Call to wife to check in, "I am doing much better", found out that her pain was caused by tooth abscess and can be treated by dental care.  Family issues are resolving as son/daughter in law are moving into their own home soon.  She and patient are planning to move into smaller, more manageable home soon.  All these changes are reducing her pain/stress and she is feeling much better at this time, no further needs - she knows to call for support/resources as needed.  Edwyna Shell, LCSW Clinical Social Worker Phone:  [phone]

## 2020-04-15 ENCOUNTER — Other Ambulatory Visit (HOSPITAL_COMMUNITY): Payer: Self-pay

## 2020-04-15 MED ORDER — AMLODIPINE BESYLATE 2.5 MG PO TABS
2.5000 mg | ORAL_TABLET | Freq: Every day | ORAL | 2 refills | Status: DC
Start: 2020-04-15 — End: 2021-04-26

## 2020-04-22 ENCOUNTER — Other Ambulatory Visit (HOSPITAL_COMMUNITY): Payer: Self-pay | Admitting: Hematology

## 2020-04-28 ENCOUNTER — Other Ambulatory Visit: Payer: Self-pay

## 2020-04-28 ENCOUNTER — Inpatient Hospital Stay (HOSPITAL_COMMUNITY): Payer: Medicare Other | Attending: Medical

## 2020-04-28 ENCOUNTER — Other Ambulatory Visit (HOSPITAL_COMMUNITY): Payer: Self-pay

## 2020-04-28 DIAGNOSIS — C9200 Acute myeloblastic leukemia, not having achieved remission: Secondary | ICD-10-CM

## 2020-04-28 LAB — COMPREHENSIVE METABOLIC PANEL
ALT: 43 U/L (ref 0–44)
AST: 36 U/L (ref 15–41)
Albumin: 3.9 g/dL (ref 3.5–5.0)
Alkaline Phosphatase: 77 U/L (ref 38–126)
Anion gap: 10 (ref 5–15)
BUN: 18 mg/dL (ref 8–23)
CO2: 26 mmol/L (ref 22–32)
Calcium: 9.4 mg/dL (ref 8.9–10.3)
Chloride: 101 mmol/L (ref 98–111)
Creatinine, Ser: 1.17 mg/dL (ref 0.61–1.24)
GFR, Estimated: >60 mL/min (ref >60)
Glucose, Bld: 97 mg/dL (ref 70–99)
Potassium: 4.7 mmol/L (ref 3.5–5.1)
Sodium: 137 mmol/L (ref 135–145)
Total Bilirubin: 0.7 mg/dL (ref 0.3–1.2)
Total Protein: 6.5 g/dL (ref 6.5–8.1)

## 2020-04-28 LAB — CBC WITH DIFFERENTIAL/PLATELET
Abs Immature Granulocytes: 0.03 10*3/uL (ref 0.00–0.07)
Basophils Absolute: 0 10*3/uL (ref 0.0–0.1)
Basophils Relative: 1 %
Eosinophils Absolute: 0 10*3/uL (ref 0.0–0.5)
Eosinophils Relative: 1 %
HCT: 45.2 % (ref 39.0–52.0)
Hemoglobin: 14.6 g/dL (ref 13.0–17.0)
Immature Granulocytes: 1 %
Lymphocytes Relative: 39 %
Lymphs Abs: 1 10*3/uL (ref 0.7–4.0)
MCH: 33 pg (ref 26.0–34.0)
MCHC: 32.3 g/dL (ref 30.0–36.0)
MCV: 102.3 fL — ABNORMAL HIGH (ref 80.0–100.0)
Monocytes Absolute: 0.3 10*3/uL (ref 0.1–1.0)
Monocytes Relative: 11 %
Neutro Abs: 1.2 10*3/uL — ABNORMAL LOW (ref 1.7–7.7)
Neutrophils Relative %: 47 %
Platelets: 136 10*3/uL — ABNORMAL LOW (ref 150–400)
RBC: 4.42 MIL/uL (ref 4.22–5.81)
RDW: 14.9 % (ref 11.5–15.5)
WBC: 2.6 10*3/uL — ABNORMAL LOW (ref 4.0–10.5)
nRBC: 0 % (ref 0.0–0.2)

## 2020-04-28 LAB — MAGNESIUM: Magnesium: 1.9 mg/dL (ref 1.7–2.4)

## 2020-04-28 MED ORDER — PROCHLORPERAZINE MALEATE 10 MG PO TABS: 10.0000 mg | ORAL_TABLET | Freq: Four times a day (QID) | ORAL | 0 refills | Status: DC | PRN

## 2020-05-10 ENCOUNTER — Inpatient Hospital Stay (HOSPITAL_COMMUNITY): Payer: Medicare Other | Attending: Hematology

## 2020-05-10 ENCOUNTER — Encounter (HOSPITAL_COMMUNITY): Payer: Self-pay | Admitting: Hematology

## 2020-05-10 ENCOUNTER — Inpatient Hospital Stay (HOSPITAL_COMMUNITY): Payer: Medicare Other

## 2020-05-10 ENCOUNTER — Inpatient Hospital Stay (HOSPITAL_BASED_OUTPATIENT_CLINIC_OR_DEPARTMENT_OTHER): Payer: Medicare Other | Admitting: Hematology

## 2020-05-10 ENCOUNTER — Other Ambulatory Visit: Payer: Self-pay

## 2020-05-10 VITALS — BP 140/86 | HR 67 | Temp 96.9°F | Resp 18

## 2020-05-10 VITALS — BP 130/75 | HR 72 | Temp 96.9°F | Resp 18 | Wt 230.0 lb

## 2020-05-10 DIAGNOSIS — F0390 Unspecified dementia without behavioral disturbance: Secondary | ICD-10-CM | POA: Insufficient documentation

## 2020-05-10 DIAGNOSIS — Z95828 Presence of other vascular implants and grafts: Secondary | ICD-10-CM

## 2020-05-10 DIAGNOSIS — C9201 Acute myeloblastic leukemia, in remission: Secondary | ICD-10-CM | POA: Diagnosis not present

## 2020-05-10 DIAGNOSIS — C9200 Acute myeloblastic leukemia, not having achieved remission: Secondary | ICD-10-CM

## 2020-05-10 DIAGNOSIS — Z79899 Other long term (current) drug therapy: Secondary | ICD-10-CM | POA: Diagnosis not present

## 2020-05-10 DIAGNOSIS — Z5111 Encounter for antineoplastic chemotherapy: Secondary | ICD-10-CM | POA: Diagnosis not present

## 2020-05-10 DIAGNOSIS — I10 Essential (primary) hypertension: Secondary | ICD-10-CM | POA: Diagnosis not present

## 2020-05-10 DIAGNOSIS — N189 Chronic kidney disease, unspecified: Secondary | ICD-10-CM | POA: Insufficient documentation

## 2020-05-10 DIAGNOSIS — M2550 Pain in unspecified joint: Secondary | ICD-10-CM | POA: Insufficient documentation

## 2020-05-10 DIAGNOSIS — D46Z Other myelodysplastic syndromes: Secondary | ICD-10-CM

## 2020-05-10 LAB — CBC WITH DIFFERENTIAL/PLATELET
Abs Immature Granulocytes: 0.12 10*3/uL — ABNORMAL HIGH (ref 0.00–0.07)
Basophils Absolute: 0 10*3/uL (ref 0.0–0.1)
Basophils Relative: 1 %
Eosinophils Absolute: 0.1 10*3/uL (ref 0.0–0.5)
Eosinophils Relative: 1 %
HCT: 45.7 % (ref 39.0–52.0)
Hemoglobin: 14.5 g/dL (ref 13.0–17.0)
Immature Granulocytes: 3 %
Lymphocytes Relative: 20 %
Lymphs Abs: 1 10*3/uL (ref 0.7–4.0)
MCH: 32.7 pg (ref 26.0–34.0)
MCHC: 31.7 g/dL (ref 30.0–36.0)
MCV: 103.2 fL — ABNORMAL HIGH (ref 80.0–100.0)
Monocytes Absolute: 0.5 10*3/uL (ref 0.1–1.0)
Monocytes Relative: 10 %
Neutro Abs: 3.1 10*3/uL (ref 1.7–7.7)
Neutrophils Relative %: 65 %
Platelets: 135 10*3/uL — ABNORMAL LOW (ref 150–400)
RBC: 4.43 MIL/uL (ref 4.22–5.81)
RDW: 14.9 % (ref 11.5–15.5)
WBC: 4.8 10*3/uL (ref 4.0–10.5)
nRBC: 0 % (ref 0.0–0.2)

## 2020-05-10 LAB — COMPREHENSIVE METABOLIC PANEL
ALT: 34 U/L (ref 0–44)
AST: 26 U/L (ref 15–41)
Albumin: 3.8 g/dL (ref 3.5–5.0)
Alkaline Phosphatase: 66 U/L (ref 38–126)
Anion gap: 7 (ref 5–15)
BUN: 25 mg/dL — ABNORMAL HIGH (ref 8–23)
CO2: 28 mmol/L (ref 22–32)
Calcium: 9.5 mg/dL (ref 8.9–10.3)
Chloride: 106 mmol/L (ref 98–111)
Creatinine, Ser: 1.2 mg/dL (ref 0.61–1.24)
GFR, Estimated: >60 mL/min (ref >60)
Glucose, Bld: 92 mg/dL (ref 70–99)
Potassium: 4.8 mmol/L (ref 3.5–5.1)
Sodium: 141 mmol/L (ref 135–145)
Total Bilirubin: 0.5 mg/dL (ref 0.3–1.2)
Total Protein: 6.4 g/dL — ABNORMAL LOW (ref 6.5–8.1)

## 2020-05-10 LAB — MAGNESIUM: Magnesium: 1.9 mg/dL (ref 1.7–2.4)

## 2020-05-10 LAB — LACTATE DEHYDROGENASE: LDH: 163 U/L (ref 98–192)

## 2020-05-10 MED ORDER — DONEPEZIL HCL 10 MG PO TABS: 10.0000 mg | ORAL_TABLET | Freq: Every day | ORAL | 6 refills | Status: DC

## 2020-05-10 MED ORDER — HEPARIN SOD (PORK) LOCK FLUSH 100 UNIT/ML IV SOLN
500.0000 [IU] | Freq: Once | INTRAVENOUS | Status: AC | PRN
  Administered 2020-05-10: 500 [IU]

## 2020-05-10 MED ORDER — SODIUM CHLORIDE 0.9 % IV SOLN
8.0000 mg | Freq: Once | INTRAVENOUS | Status: AC
  Administered 2020-05-10: 8 mg via INTRAVENOUS
  Filled 2020-05-10: qty 4

## 2020-05-10 MED ORDER — SODIUM CHLORIDE 0.9 % IV SOLN: Freq: Once | INTRAVENOUS | Status: AC

## 2020-05-10 MED ORDER — SODIUM CHLORIDE 0.9 % IV SOLN
15.0000 mg/m2 | Freq: Once | INTRAVENOUS | Status: AC
  Administered 2020-05-10: 35 mg via INTRAVENOUS
  Filled 2020-05-10: qty 7

## 2020-05-10 MED ORDER — SODIUM CHLORIDE 0.9% FLUSH
10.0000 mL | INTRAVENOUS | Status: DC | PRN
Start: 2020-05-10 — End: 2020-05-10
  Administered 2020-05-10: 10 mL

## 2020-05-10 NOTE — Patient Instructions (Signed)
[facility] at [facility] Discharge Instructions  You were seen today by Dr. Ellin Saba. He went over your recent results. You received your treatment today. Take venetoclax daily for 2 weeks, then stop. Your Aricept will be increased to 10 mg daily. Dr. Ellin Saba will see you back in 6 weeks for labs and follow up.   Thank you for choosing [facility] at [facility] to provide your oncology and hematology care.  To afford each patient quality time with our provider, please arrive at least 15 minutes before your scheduled appointment time.   If you have a lab appointment with the Cancer Center please come in thru the Main Entrance and check in at the main information desk  You need to re-schedule your appointment should you arrive 10 or more minutes late.  We strive to give you quality time with our providers, and arriving late affects you and other patients whose appointments are after yours.  Also, if you no show three or more times for appointments you may be dismissed from the clinic at the providers discretion.     Again, thank you for choosing [facility].  Our hope is that these requests will decrease the amount of time that you wait before being seen by our physicians.       _____________________________________________________________  Should you have questions after your visit to [facility], please contact our office at [phone] between the hours of 8:00 a.m. and 4:30 p.m.  Voicemails left after 4:00 p.m. will not be returned until the following business day.  For prescription refill requests, have your pharmacy contact our office and allow 72 hours.    Cancer Center Support Programs:   > Cancer Support Group  2nd Tuesday of the month 1pm-2pm, Journey Room

## 2020-05-10 NOTE — Progress Notes (Signed)
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[place])   08/22/2018 - 12/06/2018 Chemotherapy   The patient had palonosetron (ALOXI) injection 0.25 mg, 0.25 mg, Intravenous,  Once, 4 of 6 cycles Administration: 0.25 mg (08/22/2018), 0.25 mg (08/26/2018), 0.25 mg (08/28/2018), 0.25 mg (08/30/2018), 0.25 mg (10/01/2018), 0.25 mg (10/02/2018), 0.25 mg (11/04/2018), 0.25 mg (09/23/2018), 0.25 mg (09/25/2018), 0.25 mg (09/27/2018), 0.25 mg (10/28/2018), 0.25 mg (10/30/2018), 0.25 mg (11/01/2018), 0.25 mg (12/02/2018), 0.25 mg (12/04/2018), 0.25 mg (12/06/2018) azaCITIDine (VIDAZA) 100 mg in sodium chloride 0.9 % 50 mL chemo infusion, 110 mg (66.7 % of original dose 75 mg/m2), Intravenous, Once, 4 of 6 cycles Dose modification: 50 mg/m2 (original dose 75 mg/m2, Cycle 1, Reason: Provider Judgment), 50 mg/m2 (original dose 75 mg/m2, Cycle 2, Reason: Provider Judgment) Administration: 100 mg (08/22/2018), 100 mg (08/23/2018), 100 mg (08/26/2018), 100 mg (08/27/2018), 100 mg (08/28/2018), 100 mg (08/29/2018), 100 mg (08/30/2018), 100 mg (10/01/2018), 100 mg (10/02/2018), 100 mg (11/04/2018), 100 mg (11/05/2018), 100 mg (09/23/2018), 100 mg (09/24/2018), 100 mg (09/25/2018), 100 mg (09/26/2018), 100 mg (09/27/2018), 100 mg (10/28/2018), 100 mg (10/29/2018), 100 mg (10/30/2018), 100 mg (10/31/2018), 100 mg (11/01/2018), 100 mg (12/02/2018), 100 mg (12/03/2018), 100 mg (12/04/2018), 100 mg (12/05/2018), 100 mg (12/06/2018)  for chemotherapy treatment.    01/20/2019 -  Chemotherapy   The patient had  decitabine (DACOGEN) 45 mg in sodium chloride 0.9 % 50 mL chemo infusion, 20 mg/m2 = 45 mg, Intravenous,  Once, 11 of 12 cycles Dose modification: 15 mg/m2 (original dose 20 mg/m2, Cycle 2, Reason: Other (see comments), Comment: neutropenic fever) Administration: 45 mg (01/20/2019), 45 mg (01/21/2019), 45 mg (01/22/2019), 45 mg (01/23/2019), 45 mg (01/24/2019), 35 mg (03/17/2019), 35 mg (03/18/2019), 35 mg (03/19/2019), 35 mg (03/20/2019), 35 mg (03/21/2019), 35 mg (04/22/2019), 35 mg (04/23/2019), 35 mg (04/24/2019), 35 mg (04/25/2019), 35 mg (05/26/2019), 35 mg (05/27/2019), 35 mg (05/28/2019), 35 mg (05/29/2019), 35 mg (05/30/2019), 35 mg (07/07/2019), 35 mg (07/08/2019), 35 mg (07/09/2019), 35 mg (07/10/2019), 35 mg (07/11/2019), 35 mg (08/18/2019), 35 mg (08/19/2019), 35 mg (08/20/2019), 35 mg (08/21/2019), 35 mg (08/22/2019), 35 mg (09/29/2019), 35 mg (09/30/2019), 35 mg (10/01/2019), 35 mg (10/02/2019), 35 mg (10/03/2019), 35 mg (11/17/2019), 35 mg (11/18/2019), 35 mg (11/19/2019), 35 mg (11/20/2019), 35 mg (11/21/2019), 35 mg (12/29/2019), 35 mg (12/30/2019), 35 mg (12/31/2019), 35 mg (01/01/2020), 35 mg (01/02/2020), 35 mg (02/09/2020), 35 mg (02/10/2020), 35 mg (02/11/2020), 35 mg (02/12/2020), 35 mg (02/13/2020), 35 mg (03/22/2020), 35 mg (03/23/2020), 35 mg (03/24/2020), 35 mg (03/25/2020), 35 mg (03/26/2020) ondansetron (ZOFRAN) 8 mg in sodium chloride 0.9 % 50 mL IVPB, 8 mg (100 % of original dose 8 mg), Intravenous,  Once, 11 of 12 cycles Dose modification: 8 mg (original dose 8 mg, Cycle 1) Administration: 8 mg (01/20/2019), 8 mg (01/21/2019), 8 mg (01/22/2019), 8 mg (01/23/2019), 8 mg (01/24/2019), 8 mg (03/17/2019), 8 mg (03/18/2019), 8 mg (03/19/2019), 8 mg (03/20/2019), 8 mg (03/21/2019), 8 mg (04/22/2019), 8 mg (04/23/2019), 8 mg (04/24/2019), 8 mg (04/25/2019), 8 mg (05/26/2019), 8 mg (  05/27/2019), 8 mg (05/28/2019), 8 mg (05/29/2019), 8 mg (05/30/2019), 8 mg (07/07/2019), 8 mg (07/08/2019), 8 mg (07/09/2019), 8 mg (07/10/2019), 8 mg (07/11/2019), 8 mg  (08/18/2019), 8 mg (08/19/2019), 8 mg (08/20/2019), 8 mg (08/21/2019), 8 mg (08/22/2019), 8 mg (09/29/2019), 8 mg (09/30/2019), 8 mg (10/01/2019), 8 mg (10/02/2019), 8 mg (10/03/2019), 8 mg (11/17/2019), 8 mg (11/18/2019), 8 mg (11/19/2019), 8 mg (11/20/2019), 8 mg (11/21/2019), 8 mg (12/29/2019), 8 mg (12/30/2019), 8 mg (12/31/2019), 8 mg (01/01/2020), 8 mg (01/02/2020), 8 mg (02/09/2020), 8 mg (02/10/2020), 8 mg (02/11/2020), 8 mg (02/12/2020), 8 mg (02/13/2020), 8 mg (03/22/2020), 8 mg (03/23/2020), 8 mg (03/24/2020), 8 mg (03/25/2020), 8 mg (03/26/2020)  for chemotherapy treatment.    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[place])   01/20/2019 -  Chemotherapy   The patient had decitabine (DACOGEN) 45 mg in sodium chloride 0.9 % 50 mL chemo infusion, 20 mg/m2 = 45 mg, Intravenous,  Once, 11 of 12 cycles Dose modification: 15 mg/m2 (original dose 20 mg/m2, Cycle 2, Reason: Other (see comments), Comment: neutropenic fever) Administration: 45 mg (01/20/2019), 45 mg (01/21/2019), 45 mg (01/22/2019), 45 mg (01/23/2019), 45 mg (01/24/2019), 35 mg (03/17/2019), 35 mg (03/18/2019), 35 mg (03/19/2019), 35 mg (03/20/2019), 35 mg (03/21/2019), 35 mg (04/22/2019), 35 mg (04/23/2019), 35 mg (04/24/2019), 35 mg (04/25/2019), 35 mg (05/26/2019), 35 mg (05/27/2019), 35 mg (05/28/2019), 35 mg (05/29/2019), 35 mg (05/30/2019), 35 mg (07/07/2019), 35 mg (07/08/2019), 35 mg (07/09/2019), 35 mg (07/10/2019), 35 mg (07/11/2019), 35 mg (08/18/2019), 35 mg (08/19/2019), 35 mg (08/20/2019), 35 mg (08/21/2019), 35 mg (08/22/2019), 35 mg (09/29/2019), 35 mg (09/30/2019), 35 mg (10/01/2019), 35 mg (10/02/2019), 35 mg (10/03/2019), 35 mg (11/17/2019), 35 mg (11/18/2019), 35 mg (11/19/2019), 35 mg (11/20/2019), 35 mg (11/21/2019), 35 mg (12/29/2019), 35 mg (12/30/2019), 35 mg (12/31/2019), 35 mg (01/01/2020), 35 mg (01/02/2020), 35 mg (02/09/2020), 35 mg (02/10/2020), 35 mg (02/11/2020), 35 mg (02/12/2020), 35 mg (02/13/2020), 35 mg  (03/22/2020), 35 mg (03/23/2020), 35 mg (03/24/2020), 35 mg (03/25/2020), 35 mg (03/26/2020) ondansetron (ZOFRAN) 8 mg in sodium chloride 0.9 % 50 mL IVPB, 8 mg (100 % of original dose 8 mg), Intravenous,  Once, 11 of 12 cycles Dose modification: 8 mg (original dose 8 mg, Cycle 1) Administration: 8 mg (01/20/2019), 8 mg (01/21/2019), 8 mg (01/22/2019), 8 mg (01/23/2019), 8 mg (01/24/2019), 8 mg (03/17/2019), 8 mg (03/18/2019), 8 mg (03/19/2019), 8 mg (03/20/2019), 8 mg (03/21/2019), 8 mg (04/22/2019), 8 mg (04/23/2019), 8 mg (04/24/2019), 8 mg (04/25/2019), 8 mg (05/26/2019), 8 mg (05/27/2019), 8 mg (05/28/2019), 8 mg (05/29/2019), 8 mg (05/30/2019), 8 mg (07/07/2019), 8 mg (07/08/2019), 8 mg (07/09/2019), 8 mg (07/10/2019), 8 mg (07/11/2019), 8 mg (08/18/2019), 8 mg (08/19/2019), 8 mg (08/20/2019), 8 mg (08/21/2019), 8 mg (08/22/2019), 8 mg (09/29/2019), 8 mg (09/30/2019), 8 mg (10/01/2019), 8 mg (10/02/2019), 8 mg (10/03/2019), 8 mg (11/17/2019), 8 mg (11/18/2019), 8 mg (11/19/2019), 8 mg (11/20/2019), 8 mg (11/21/2019), 8 mg (12/29/2019), 8 mg (12/30/2019), 8 mg (12/31/2019), 8 mg (01/01/2020), 8 mg (01/02/2020), 8 mg (02/09/2020), 8 mg (02/10/2020), 8 mg (02/11/2020), 8 mg (02/12/2020), 8 mg (02/13/2020), 8 mg (03/22/2020), 8 mg (03/23/2020), 8 mg (03/24/2020), 8 mg (03/25/2020), 8 mg (03/26/2020)  for chemotherapy treatment.      CANCER STAGING: Cancer Staging No matching staging information was found for the patient.  INTERVAL HISTORY:  Mr. Romney Fahey, a 75 y.o. male, returns for routine follow-up and consideration for next cycle of  chemotherapy. Jakaiden was last seen on 03/22/2020.  Due for cycle #12 of decitabine today.   Today he is accompanied by his wife. Overall, he tells me he has been feeling pretty well. He tolerated the previous treatment well. His arthalgias in his knee and ankles and back pain are slowly improving. He is taking Aricept for his memory and his wife reports that it is helping. His appetite is good and denies N/V.  He denies having any recent infections or F/C.  Overall, he feels ready for next cycle of chemo today.    REVIEW OF SYSTEMS:  Review of Systems  Constitutional: Positive for appetite change (75%) and fatigue (50%). Negative for chills and fever.  Cardiovascular: Positive for leg swelling (R leg swelling).  Gastrointestinal: Negative for nausea and vomiting.  Musculoskeletal: Positive for arthralgias (knees & ankles pain w/ movement) and back pain (w/ movement).    PAST MEDICAL/SURGICAL HISTORY:  Past Medical History:  Diagnosis Date  . Arthritis   . 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 Cancer ([place]) 01-28-2014   skin cancer  . CKD (chronic kidney disease), stage III ([place])   . GERD (gastroesophageal reflux disease)   . Heart murmur    NOTED DURING PHYSICAL WHEN HE WAS ENLISTING IN [facility] , DIDNT KNOW UNTIL THAT TIME AND REPORTS , "THATS THE LAST I HEARD ABOUT IT "   . History of hypertension    no longer issue  . History of kidney stones   . History of malignant melanoma of skin    excision top of scalp 2015-- no recurrence  . History of urinary retention    post op lumbar fusion surgery 04/ 2016  . Hypertension   . 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 Left ureteral calculus   . Seasonal allergies   . Wears glasses   . Wears glasses   . Wears partial dentures    upper and lower   Past Surgical History:  Procedure Laterality Date  . ANKLE FUSION Right 2007  . CARPAL TUNNEL RELEASE Left 12/28/2009   w/ pulley release left long finger  . CARPAL TUNNEL RELEASE Right 07/22/2013   Procedure: RIGHT CARPAL TUNNEL RELEASE;  Surgeon: Cammie Sickle., MD;  Location: [facility];  Service: Orthopedics;  Laterality: Right;  . COLONOSCOPY    . CYSTO/  LEFT RETROGRADE PYELOGRAM  11/21/2010  . CYSTOSCOPY WITH STENT PLACEMENT Left 03/09/2016   Procedure: CYSTOSCOPY WITH STENT  PLACEMENT;  Surgeon: Franchot Gallo, MD;  Location: [facility];  Service: Urology;  Laterality: Left;  . CYSTOSCOPY/RETROGRADE/URETEROSCOPY/STONE EXTRACTION WITH BASKET Left 03/09/2016   Procedure: CYSTOSCOPY/RETROGRADE/URETEROSCOPY/STONE EXTRACTION WITH BASKET;  Surgeon: Franchot Gallo, MD;  Location[facility];  Service: Urology;  Laterality: Left;  . LEFT URETEROSCOPIC LASER LITHOTRIPSY STONE EXTRACTION/ STENT PLACEMENT  05/23/2010  . MOHS SURGERY     TOP OF THE HEAD   . ORIF ANKLE FRACTURE Right 1978  . PORT-A-CATH REMOVAL Right 02/14/2019   Procedure: MINOR REMOVAL PORT-A-CATH;  Surgeon: Aviva Signs, MD;  Location: [facility];  Service: General;  Laterality: Right;  . PORTACATH PLACEMENT Right 08/19/2018   Procedure: INSERTION PORT-A-CATH (attached catheter in right subclavian);  Surgeon: Aviva Signs, MD;  Location: [facility];  Service: General;  Laterality: Right;  . PORTACATH PLACEMENT Left 05/16/2019   Procedure: INSERTION PORT-A-CATH (attached catheter in left subclavian);  Surgeon: Aviva Signs,  MD;  Location: [facility];  Service: General;  Laterality: Left;  . POSTERIOR LUMBAR FUSION  08/21/2014   laminectomy and decompression L2 -- L5  . RIGHT LOWER LEG SURGERY  X3  1975 to 1976   including ORIF  . TONSILLECTOMY AND ADENOIDECTOMY  1986  . UMBILICAL HERNIA REPAIR  2009 approx    SOCIAL HISTORY:  Social History   Socioeconomic History  . Marital status: Married    Spouse name: Not on file  . Number of children: Not on file  . Years of education: Not on file  . Highest education level: Not on file  Occupational History  . Not on file  Tobacco Use  . Smoking status: Former Smoker    Years: 20.00    Types: Cigarettes    Quit date: 07/17/1986    Years since quitting: 33.8  . Smokeless tobacco: Never Used  Vaping Use  . Vaping Use: Never used  Substance and Sexual Activity  . Alcohol use: Yes    Alcohol/week: 7.0 - 14.0 standard drinks     Types: 7 - 14 Cans of beer per week    Comment: 1 -2 beer daily  . Drug use: No  . Sexual activity: Not Currently  Other Topics Concern  . Not on file  Social History Narrative  . Not on file   Social Determinants of Health   Financial Resource Strain: Low Risk   . Difficulty of Paying Living Expenses: Not hard at all  Food Insecurity: No Food Insecurity  . Worried About Programme researcher, broadcasting/film/video in the Last Year: Never true  . Ran Out of Food in the Last Year: Never true  Transportation Needs: No Transportation Needs  . Lack of Transportation (Medical): No  . Lack of Transportation (Non-Medical): No  Physical Activity: Not on file  Stress: No Stress Concern Present  . Feeling of Stress : Not at all  Social Connections: Moderately Isolated  . Frequency of Communication with Friends and Family: Twice a week  . Frequency of Social Gatherings with Friends and Family: Never  . Attends Religious Services: Never  . Active Member of Clubs or Organizations: Yes  . Attends Banker Meetings: More than 4 times per year  . Marital Status: Married  Catering manager Violence: Not on file    FAMILY HISTORY:  Family History  Problem Relation Age of Onset  . Stroke Mother   . Prostate cancer Father   . Bone cancer Father   . Diverticulitis Father   . Rheum arthritis Sister   . Urinary tract infection Sister   . Colon cancer Neg Hx     CURRENT MEDICATIONS:  Current Outpatient Medications  Medication Sig Dispense Refill  . acetaminophen (TYLENOL) 500 MG tablet Take 1,000 mg by mouth 3 (three) times daily.     Marland Kitchen allopurinol (ZYLOPRIM) 300 MG tablet Take 300 mg by mouth at bedtime.     Marland Kitchen amLODipine (NORVASC) 2.5 MG tablet Take 1 tablet (2.5 mg total) by mouth daily. 90 tablet 2  . cetirizine (ZYRTEC) 10 MG tablet Take 10 mg by mouth daily. IN THE MORNING    . Cholecalciferol (VITAMIN D3) 1.25 MG (50000 UT) CAPS Take 1 capsule by mouth daily.    . diclofenac Sodium (VOLTAREN) 1 %  GEL Apply three times daily to knees as needed for pain 50 g 3  . docusate sodium (COLACE) 100 MG capsule Take 100 mg by mouth at bedtime.     . donepezil (  ARICEPT) 10 MG tablet Take 1 tablet (10 mg total) by mouth at bedtime. 30 tablet 6  . HYDROcodone-acetaminophen (NORCO) 5-325 MG tablet Take 1-2 tablets by mouth every 6 (six) hours as needed for moderate pain. 240 tablet 0  . lansoprazole (PREVACID) 15 MG capsule Take 15 mg by mouth daily.     Marland Kitchen lidocaine-prilocaine (EMLA) cream Apply 1 application topically See admin instructions. ONE HOUR PRIOR TO CHEMOTHERAPY APPOINTMENT (Patient not taking: Reported on 03/22/2020)    . magnesium oxide (MAG-OX) 400 (241.3 Mg) MG tablet TAKE 1 TABLET BY MOUTH THREE TIMES DAILY 90 tablet 0  . prochlorperazine (COMPAZINE) 10 MG tablet Take 1 tablet (10 mg total) by mouth every 6 (six) hours as needed for nausea or vomiting. 30 tablet 0  . SF 5000 PLUS 1.1 % CREA dental cream Place 1 application onto teeth at bedtime.    . tamsulosin (FLOMAX) 0.4 MG CAPS capsule Take 1 capsule (0.4 mg total) by mouth every evening. 30 capsule 2  . VENCLEXTA 100 MG tablet TAKE 2 TABLETS (200 MG) BY MOUTH DAILY 60 tablet 0   No current facility-administered medications for this visit.   Facility-Administered Medications Ordered in Other Visits  Medication Dose Route Frequency Provider Last Rate Last Admin  . sodium chloride flush (NS) 0.9 % injection 10 mL  10 mL Intracatheter PRN [name], MD   10 mL at 11/21/19 1030  . sodium chloride flush (NS) 0.9 % injection 20 mL  20 mL Intravenous PRN [name], MD   20 mL at 07/21/19 1053    ALLERGIES:  No Known Allergies  PHYSICAL EXAM:  Performance status (ECOG): 1 - Symptomatic but completely ambulatory  Vitals:   05/10/20 0947  BP: 130/75  Pulse: 72  Resp: 18  Temp: (!) 96.9 F (36.1 C)  SpO2: 97%   Wt Readings from Last 3 Encounters:  05/10/20 230 lb (104.3 kg)  03/22/20 231 lb 3.2 oz (104.9  kg)  03/03/20 231 lb 0.7 oz (104.8 kg)   Physical Exam Vitals reviewed.  Constitutional:      Appearance: Normal appearance. He is obese.  Cardiovascular:     Rate and Rhythm: Normal rate and regular rhythm.     Pulses: Normal pulses.     Heart sounds: Normal heart sounds.  Pulmonary:     Effort: Pulmonary effort is normal.     Breath sounds: Normal breath sounds.  Musculoskeletal:     Right lower leg: Edema (1+) present.     Left lower leg: Edema (trace) present.  Neurological:     General: No focal deficit present.     Mental Status: He is alert and oriented to person, place, and time.  Psychiatric:        Mood and Affect: Mood normal.        Behavior: Behavior normal.     LABORATORY DATA:   I have reviewed the labs as listed.  CBC Latest Ref Rng & Units 05/10/2020 04/28/2020 03/22/2020  WBC 4.0 - 10.5 K/uL 4.8 2.6(L) 3.8(L)  Hemoglobin 13.0 - 17.0 g/dL 14.5 14.6 14.2  Hematocrit 39.0 - 52.0 % 45.7 45.2 44.7  Platelets 150 - 400 K/uL 135(L) 136(L) 145(L)   CMP Latest Ref Rng & Units 05/10/2020 04/28/2020 03/22/2020  Glucose 70 - 99 mg/dL 92 97 104(H)  BUN 8 - 23 mg/dL 25(H) 18 24(H)  Creatinine 0.61 - 1.24 mg/dL 1.20 1.17 1.19  Sodium 135 - 145 mmol/L 141 137 141  Potassium 3.5 - 5.1  mmol/L 4.8 4.7 4.9  Chloride 98 - 111 mmol/L 106 101 106  CO2 22 - 32 mmol/L 28 26 27   Calcium 8.9 - 10.3 mg/dL 9.5 9.4 9.6  Total Protein 6.5 - 8.1 g/dL 6.4(L) 6.5 6.5  Total Bilirubin 0.3 - 1.2 mg/dL 0.5 0.7 0.6  Alkaline Phos 38 - 126 U/L 66 77 69  AST 15 - 41 U/L 26 36 27  ALT 0 - 44 U/L 34 43 34   Lab Results  Component Value Date   LDH 163 05/10/2020   LDH 160 03/22/2020   LDH 142 03/03/2020    DIAGNOSTIC IMAGING:  I have independently reviewed the scans and discussed with the patient. No results found.   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 -BMBX on  02/25/2019 after cycle 1 did not show any evidence of leukemia. -CT CAP on 02/12/2019 shows numerous bilateral irregular/spiculated pulmonary nodules measuring up to 14 mm, nonspecific. Hepatic steatosis. Spleen is normal. -I have talked to Dr.Ravandi at [facility] per patient request.  There are clinical trials available upon progression of his AML.  There are also some clinical trials available now based on MRD positivity.  Patient not interested in moving to [place] at this time.   PLAN:  1. AML: -He denies any fevers or infections. -Reviewed labs today which showed normal white count, hemoglobin and mild thrombocytopenia. -He will proceed with his decitabine, dose reduced. -He was told to start venetoclax 200 mg daily for 14 days and stop. -RTC 6 weeks for follow-up with repeat labs and next cycle.  2. Arthralgias: -Continue hydrocodone 5/325 2 in the morning, 2 tablets at noon, 2 tablets in the evening and 1 tablet at bedtime.  3. Hypomagnesemia: -Continue magnesium 3 times a day.  Magnesium is 1.9.  4. Hypertension: -Continue Norvasc 2.5 mg daily.  5. CKD: -Creatinine is 1.20 and improved since lisinopril on hold.  6. Dementia: -Reports tolerating Aricept 5 mg daily very well.  Mild improvement in forgetfulness and agitation. -We will increase Aricept to 10 mg daily.   Orders placed this encounter:  No orders of the defined types were placed in this encounter.    [name], MD [place] [phone]   I, Milinda Antis, am acting as a scribe for Dr. Sanda Linger.  I, [name] MD, have reviewed the above documentation for accuracy and completeness, and I agree with the above.

## 2020-05-10 NOTE — Progress Notes (Signed)
Patient presents today for Decitabine infusion.  Vital signs within parameters for treatment.  Labs pending.  Patients only complaint is fatigue unrelieved by rest.   Labs reviewd and within parameters for treatment.  Message received from Chasity Edwards/Dr. Ellin Saba patient okay for treatment.  PORT accessed.  No blood return.  Patient has no discomfort when flushing.  No signs of infiltration.  Will reassess before chemo infusion.  PORT reassessed, blood return noted.  Treatment given today per MD orders.  Tolerated infusion without adverse affects.  Vital signs stable.  No complaints at this time.  Discharge from clinic ambulatory in stable condition.  Alert and oriented X 3.  Follow up with [facility] as scheduled.

## 2020-05-10 NOTE — Progress Notes (Signed)
Good for treatment today per Dr. Katragadda. 

## 2020-05-10 NOTE — Progress Notes (Signed)
Pt is taking Venetoclax as prescribed with no side effects.

## 2020-05-10 NOTE — Patient Instructions (Signed)
[facility] Discharge Instructions for Patients Receiving Chemotherapy  Today you received the following chemotherapy agents   To help prevent nausea and vomiting after your treatment, we encourage you to take your nausea medication   If you develop nausea and vomiting that is not controlled by your nausea medication, call the clinic.   BELOW ARE SYMPTOMS THAT SHOULD BE REPORTED IMMEDIATELY:  *FEVER GREATER THAN 100.5 F  *CHILLS WITH OR WITHOUT FEVER  NAUSEA AND VOMITING THAT IS NOT CONTROLLED WITH YOUR NAUSEA MEDICATION  *UNUSUAL SHORTNESS OF BREATH  *UNUSUAL BRUISING OR BLEEDING  TENDERNESS IN MOUTH AND THROAT WITH OR WITHOUT PRESENCE OF ULCERS  *URINARY PROBLEMS  *BOWEL PROBLEMS  UNUSUAL RASH Items with * indicate a potential emergency and should be followed up as soon as possible.  Feel free to call the clinic should you have any questions or concerns. The clinic phone number is [phone].  Please show the CHEMO ALERT CARD at check-in to the Emergency Department and triage nurse.   

## 2020-05-11 ENCOUNTER — Inpatient Hospital Stay (HOSPITAL_COMMUNITY): Payer: Medicare Other

## 2020-05-11 ENCOUNTER — Other Ambulatory Visit: Payer: Self-pay

## 2020-05-11 VITALS — BP 126/73 | HR 67 | Temp 97.0°F | Resp 17

## 2020-05-11 DIAGNOSIS — Z95828 Presence of other vascular implants and grafts: Secondary | ICD-10-CM

## 2020-05-11 DIAGNOSIS — C9200 Acute myeloblastic leukemia, not having achieved remission: Secondary | ICD-10-CM

## 2020-05-11 DIAGNOSIS — D46Z Other myelodysplastic syndromes: Secondary | ICD-10-CM

## 2020-05-11 DIAGNOSIS — Z5111 Encounter for antineoplastic chemotherapy: Secondary | ICD-10-CM | POA: Diagnosis not present

## 2020-05-11 MED ORDER — HEPARIN SOD (PORK) LOCK FLUSH 100 UNIT/ML IV SOLN
500.0000 [IU] | Freq: Once | INTRAVENOUS | Status: AC | PRN
  Administered 2020-05-11: 500 [IU]

## 2020-05-11 MED ORDER — SODIUM CHLORIDE 0.9% FLUSH
10.0000 mL | INTRAVENOUS | Status: DC | PRN
  Administered 2020-05-11: 10 mL

## 2020-05-11 MED ORDER — SODIUM CHLORIDE 0.9 % IV SOLN
8.0000 mg | Freq: Once | INTRAVENOUS | Status: AC
  Administered 2020-05-11: 8 mg via INTRAVENOUS
  Filled 2020-05-11: qty 4

## 2020-05-11 MED ORDER — SODIUM CHLORIDE 0.9 % IV SOLN: Freq: Once | INTRAVENOUS | Status: AC

## 2020-05-11 MED ORDER — SODIUM CHLORIDE 0.9 % IV SOLN
15.0000 mg/m2 | Freq: Once | INTRAVENOUS | Status: AC
  Administered 2020-05-11: 35 mg via INTRAVENOUS
  Filled 2020-05-11: qty 7

## 2020-05-11 NOTE — Progress Notes (Signed)
Patient presents today for Decitabine infusion.  Vital signs WNL.  Labs within parameters for treatment.  No new complaints since last visit.  Decitabine infusion given today per MD orders.  Tolerated infusion without adverse affects.  Vital signs stable.  No complaints at this time.  Discharge from clinic ambulatory in stable condition.  Alert and oriented X 3.  Follow up with [facility] as scheduled.

## 2020-05-11 NOTE — Patient Instructions (Signed)
[facility] Discharge Instructions for Patients Receiving Chemotherapy  Today you received the following chemotherapy agents   To help prevent nausea and vomiting after your treatment, we encourage you to take your nausea medication   If you develop nausea and vomiting that is not controlled by your nausea medication, call the clinic.   BELOW ARE SYMPTOMS THAT SHOULD BE REPORTED IMMEDIATELY:  *FEVER GREATER THAN 100.5 F  *CHILLS WITH OR WITHOUT FEVER  NAUSEA AND VOMITING THAT IS NOT CONTROLLED WITH YOUR NAUSEA MEDICATION  *UNUSUAL SHORTNESS OF BREATH  *UNUSUAL BRUISING OR BLEEDING  TENDERNESS IN MOUTH AND THROAT WITH OR WITHOUT PRESENCE OF ULCERS  *URINARY PROBLEMS  *BOWEL PROBLEMS  UNUSUAL RASH Items with * indicate a potential emergency and should be followed up as soon as possible.  Feel free to call the clinic should you have any questions or concerns. The clinic phone number is [phone].  Please show the CHEMO ALERT CARD at check-in to the Emergency Department and triage nurse.   

## 2020-05-12 ENCOUNTER — Inpatient Hospital Stay (HOSPITAL_COMMUNITY): Payer: Medicare Other

## 2020-05-12 ENCOUNTER — Other Ambulatory Visit: Payer: Self-pay

## 2020-05-12 VITALS — BP 131/74 | HR 64 | Temp 96.9°F | Resp 18

## 2020-05-12 DIAGNOSIS — Z95828 Presence of other vascular implants and grafts: Secondary | ICD-10-CM

## 2020-05-12 DIAGNOSIS — Z5111 Encounter for antineoplastic chemotherapy: Secondary | ICD-10-CM | POA: Diagnosis not present

## 2020-05-12 DIAGNOSIS — D46Z Other myelodysplastic syndromes: Secondary | ICD-10-CM

## 2020-05-12 DIAGNOSIS — C9200 Acute myeloblastic leukemia, not having achieved remission: Secondary | ICD-10-CM

## 2020-05-12 MED ORDER — HEPARIN SOD (PORK) LOCK FLUSH 100 UNIT/ML IV SOLN
500.0000 [IU] | Freq: Once | INTRAVENOUS | Status: AC | PRN
  Administered 2020-05-12: 500 [IU]

## 2020-05-12 MED ORDER — SODIUM CHLORIDE 0.9 % IV SOLN: Freq: Once | INTRAVENOUS | Status: AC

## 2020-05-12 MED ORDER — SODIUM CHLORIDE 0.9 % IV SOLN
8.0000 mg | Freq: Once | INTRAVENOUS | Status: AC
  Administered 2020-05-12: 8 mg via INTRAVENOUS
  Filled 2020-05-12: qty 4

## 2020-05-12 MED ORDER — SODIUM CHLORIDE 0.9% FLUSH
10.0000 mL | INTRAVENOUS | Status: DC | PRN
  Administered 2020-05-12: 10 mL

## 2020-05-12 MED ORDER — SODIUM CHLORIDE 0.9 % IV SOLN
15.0000 mg/m2 | Freq: Once | INTRAVENOUS | Status: AC
  Administered 2020-05-12: 35 mg via INTRAVENOUS
  Filled 2020-05-12: qty 7

## 2020-05-12 NOTE — Progress Notes (Signed)
Patient presents today for Decitaine day 3.  Vital signs within parameters for treatment.  Labs drawn 05/10/20 with in parameters for treatment.  Patient has no new complaints since last visit.  Decitabine infusion given today per MD orders.  Tolerated infusion without adverse affects.  Vital signs stable.  No complaints at this time.  Discharge from clinic ambulatory in stable condition.  Alert and oriented X 3.  Follow up with [facility] as scheduled.

## 2020-05-12 NOTE — Patient Instructions (Signed)
[facility] Discharge Instructions for Patients Receiving Chemotherapy  Today you received the following chemotherapy agents   To help prevent nausea and vomiting after your treatment, we encourage you to take your nausea medication   If you develop nausea and vomiting that is not controlled by your nausea medication, call the clinic.   BELOW ARE SYMPTOMS THAT SHOULD BE REPORTED IMMEDIATELY:  *FEVER GREATER THAN 100.5 F  *CHILLS WITH OR WITHOUT FEVER  NAUSEA AND VOMITING THAT IS NOT CONTROLLED WITH YOUR NAUSEA MEDICATION  *UNUSUAL SHORTNESS OF BREATH  *UNUSUAL BRUISING OR BLEEDING  TENDERNESS IN MOUTH AND THROAT WITH OR WITHOUT PRESENCE OF ULCERS  *URINARY PROBLEMS  *BOWEL PROBLEMS  UNUSUAL RASH Items with * indicate a potential emergency and should be followed up as soon as possible.  Feel free to call the clinic should you have any questions or concerns. The clinic phone number is [phone].  Please show the CHEMO ALERT CARD at check-in to the Emergency Department and triage nurse.   

## 2020-05-13 ENCOUNTER — Encounter (HOSPITAL_COMMUNITY): Payer: Self-pay

## 2020-05-13 ENCOUNTER — Inpatient Hospital Stay (HOSPITAL_COMMUNITY): Payer: Medicare Other

## 2020-05-13 VITALS — BP 134/76 | HR 70 | Temp 96.7°F | Resp 18

## 2020-05-13 DIAGNOSIS — D46Z Other myelodysplastic syndromes: Secondary | ICD-10-CM

## 2020-05-13 DIAGNOSIS — Z95828 Presence of other vascular implants and grafts: Secondary | ICD-10-CM

## 2020-05-13 DIAGNOSIS — C9200 Acute myeloblastic leukemia, not having achieved remission: Secondary | ICD-10-CM

## 2020-05-13 DIAGNOSIS — Z5111 Encounter for antineoplastic chemotherapy: Secondary | ICD-10-CM | POA: Diagnosis not present

## 2020-05-13 MED ORDER — SODIUM CHLORIDE 0.9 % IV SOLN
8.0000 mg | Freq: Once | INTRAVENOUS | Status: AC
  Administered 2020-05-13: 8 mg via INTRAVENOUS
  Filled 2020-05-13: qty 4

## 2020-05-13 MED ORDER — SODIUM CHLORIDE 0.9% FLUSH
10.0000 mL | INTRAVENOUS | Status: DC | PRN
  Administered 2020-05-13 (×2): 10 mL

## 2020-05-13 MED ORDER — HEPARIN SOD (PORK) LOCK FLUSH 100 UNIT/ML IV SOLN
500.0000 [IU] | Freq: Once | INTRAVENOUS | Status: AC | PRN
  Administered 2020-05-13: 500 [IU]

## 2020-05-13 MED ORDER — SODIUM CHLORIDE 0.9 % IV SOLN: Freq: Once | INTRAVENOUS | Status: AC

## 2020-05-13 MED ORDER — SODIUM CHLORIDE 0.9 % IV SOLN
15.0000 mg/m2 | Freq: Once | INTRAVENOUS | Status: AC
  Administered 2020-05-13: 35 mg via INTRAVENOUS
  Filled 2020-05-13: qty 7

## 2020-05-13 NOTE — Progress Notes (Signed)
Richard Wallace tolerated Decitabine infusion well without complaints or incident. Port left accessed and flushed for use tomorrow. VSS upon discharge. Pt discharged self ambulatory using his walker in satisfactory condition 

## 2020-05-13 NOTE — Patient Instructions (Signed)
[facility] Discharge Instructions for Patients Receiving Chemotherapy   Beginning January 23rd 2017 lab work for the Cancer Center will be done in the  Main lab at [place] on 1st floor. If you have a lab appointment with the Cancer Center please come in thru the  Main Entrance and check in at the main information desk   Today you received the following chemotherapy agents Decitabine. Follow-up as scheduled  To help prevent nausea and vomiting after your treatment, we encourage you to take your nausea medication   If you develop nausea and vomiting, or diarrhea that is not controlled by your medication, call the clinic.  The clinic phone number is [phone]. Office hours are Monday-Friday 8:30am-5:00pm.  BELOW ARE SYMPTOMS THAT SHOULD BE REPORTED IMMEDIATELY:  *FEVER GREATER THAN 101.0 F  *CHILLS WITH OR WITHOUT FEVER  NAUSEA AND VOMITING THAT IS NOT CONTROLLED WITH YOUR NAUSEA MEDICATION  *UNUSUAL SHORTNESS OF BREATH  *UNUSUAL BRUISING OR BLEEDING  TENDERNESS IN MOUTH AND THROAT WITH OR WITHOUT PRESENCE OF ULCERS  *URINARY PROBLEMS  *BOWEL PROBLEMS  UNUSUAL RASH Items with * indicate a potential emergency and should be followed up as soon as possible. If you have an emergency after office hours please contact your primary care physician or go to the nearest emergency department.  Please call the clinic during office hours if you have any questions or concerns.   You may also contact the Patient Navigator at [phone] should you have any questions or need assistance in obtaining follow up care.      Resources For Cancer Patients and their Caregivers ? American Cancer Society: Can assist with transportation, wigs, general needs, runs Look Good Feel Better.        1-888-227-6333 ? Cancer Care: Provides financial assistance, online support groups, medication/co-pay assistance.  1-800-813-HOPE (4673) ? [facility] Assists [place] cancer patients and their families through emotional , educational and financial support.  336-427-4357 ? [place] DSS Where to apply for food stamps, Medicaid and utility assistance. 336-342-1394 ? [facility]: Transportation to medical appointments. 336-347-2287 ? Social Security Administration: May apply for disability if have a Stage IV cancer. 336-342-7796 1-800-772-1213 ? [place] Aging, Disability and Transit Services: Assists with nutrition, care and transit needs. 336-349-2343         

## 2020-05-14 ENCOUNTER — Inpatient Hospital Stay (HOSPITAL_COMMUNITY): Payer: Medicare Other

## 2020-05-14 ENCOUNTER — Other Ambulatory Visit: Payer: Self-pay

## 2020-05-14 VITALS — BP 136/88 | HR 64 | Temp 96.9°F | Resp 18

## 2020-05-14 DIAGNOSIS — C9200 Acute myeloblastic leukemia, not having achieved remission: Secondary | ICD-10-CM

## 2020-05-14 DIAGNOSIS — D46Z Other myelodysplastic syndromes: Secondary | ICD-10-CM

## 2020-05-14 DIAGNOSIS — Z95828 Presence of other vascular implants and grafts: Secondary | ICD-10-CM

## 2020-05-14 DIAGNOSIS — Z5111 Encounter for antineoplastic chemotherapy: Secondary | ICD-10-CM | POA: Diagnosis not present

## 2020-05-14 MED ORDER — SODIUM CHLORIDE 0.9 % IV SOLN: Freq: Once | INTRAVENOUS | Status: AC

## 2020-05-14 MED ORDER — HEPARIN SOD (PORK) LOCK FLUSH 100 UNIT/ML IV SOLN
500.0000 [IU] | Freq: Once | INTRAVENOUS | Status: AC | PRN
  Administered 2020-05-14: 500 [IU]

## 2020-05-14 MED ORDER — SODIUM CHLORIDE 0.9 % IV SOLN
15.0000 mg/m2 | Freq: Once | INTRAVENOUS | Status: AC
  Administered 2020-05-14: 35 mg via INTRAVENOUS
  Filled 2020-05-14: qty 7

## 2020-05-14 MED ORDER — SODIUM CHLORIDE 0.9% FLUSH
10.0000 mL | INTRAVENOUS | Status: DC | PRN
  Administered 2020-05-14: 10 mL

## 2020-05-14 MED ORDER — SODIUM CHLORIDE 0.9 % IV SOLN
8.0000 mg | Freq: Once | INTRAVENOUS | Status: AC
  Administered 2020-05-14: 8 mg via INTRAVENOUS
  Filled 2020-05-14: qty 4

## 2020-05-14 NOTE — Progress Notes (Signed)
Patient presents today for Decitabine infusion day 5.  Vital signs within parameters.  Labs drawn 05/10/20 within parameters for treatment.  No new complaints since last visit.  Decitabine given today per MD orders.  Tolerated infusion without adverse affects.  Vital signs stable.  No complaints at this time.  Discharge from clinic ambulatory in stable condition.  Alert and oriented X 3.  Follow up with [facility] as scheduled.

## 2020-05-14 NOTE — Patient Instructions (Signed)
[facility] Discharge Instructions for Patients Receiving Chemotherapy  Today you received the following chemotherapy agents   To help prevent nausea and vomiting after your treatment, we encourage you to take your nausea medication   If you develop nausea and vomiting that is not controlled by your nausea medication, call the clinic.   BELOW ARE SYMPTOMS THAT SHOULD BE REPORTED IMMEDIATELY:  *FEVER GREATER THAN 100.5 F  *CHILLS WITH OR WITHOUT FEVER  NAUSEA AND VOMITING THAT IS NOT CONTROLLED WITH YOUR NAUSEA MEDICATION  *UNUSUAL SHORTNESS OF BREATH  *UNUSUAL BRUISING OR BLEEDING  TENDERNESS IN MOUTH AND THROAT WITH OR WITHOUT PRESENCE OF ULCERS  *URINARY PROBLEMS  *BOWEL PROBLEMS  UNUSUAL RASH Items with * indicate a potential emergency and should be followed up as soon as possible.  Feel free to call the clinic should you have any questions or concerns. The clinic phone number is [phone].  Please show the CHEMO ALERT CARD at check-in to the Emergency Department and triage nurse.   

## 2020-05-18 ENCOUNTER — Other Ambulatory Visit (HOSPITAL_COMMUNITY): Payer: Self-pay | Admitting: Surgery

## 2020-05-18 DIAGNOSIS — D46Z Other myelodysplastic syndromes: Secondary | ICD-10-CM

## 2020-05-18 DIAGNOSIS — C9200 Acute myeloblastic leukemia, not having achieved remission: Secondary | ICD-10-CM

## 2020-05-18 MED ORDER — HYDROCODONE-ACETAMINOPHEN 5-325 MG PO TABS: 1.0000 | ORAL_TABLET | Freq: Four times a day (QID) | ORAL | 0 refills | Status: DC | PRN

## 2020-05-22 ENCOUNTER — Emergency Department (HOSPITAL_COMMUNITY)
Admission: EM | Admit: 2020-05-22 | Discharge: 2020-05-22 | Disposition: A | Discharge status: Home / Self Care | Payer: Medicare Other

## 2020-05-25 ENCOUNTER — Other Ambulatory Visit (HOSPITAL_COMMUNITY): Payer: Self-pay | Admitting: Hematology

## 2020-05-26 ENCOUNTER — Other Ambulatory Visit (HOSPITAL_COMMUNITY): Payer: Self-pay

## 2020-05-26 DIAGNOSIS — D46Z Other myelodysplastic syndromes: Secondary | ICD-10-CM

## 2020-05-26 DIAGNOSIS — C9200 Acute myeloblastic leukemia, not having achieved remission: Secondary | ICD-10-CM

## 2020-05-26 MED ORDER — ALLOPURINOL 300 MG PO TABS: 300.0000 mg | ORAL_TABLET | Freq: Every day | ORAL | 3 refills | Status: DC

## 2020-05-26 MED ORDER — MAGNESIUM OXIDE 400 (241.3 MG) MG PO TABS: 1.0000 | ORAL_TABLET | Freq: Three times a day (TID) | ORAL | 3 refills | Status: DC

## 2020-05-28 ENCOUNTER — Ambulatory Visit (HOSPITAL_COMMUNITY): Payer: Medicare Other

## 2020-06-02 ENCOUNTER — Telehealth (HOSPITAL_COMMUNITY): Payer: Self-pay | Admitting: Pharmacy Technician

## 2020-06-02 NOTE — Telephone Encounter (Signed)
Oral Oncology Patient Advocate Encounter  Was successful in securing patient a $10,000 grant from [employer] to provide copayment coverage for [place].  This will keep the out of pocket expense at $0.     [employer] ID: [id number]  The billing information is as follows and has been shared with [place].    RxBin: [id number] PCN: [id number] Member ID: [id number] Group ID: [id number] Dates of Eligibility: 05/03/20 through 05/02/21  Fund:  Acute Myeloid [place] Patient [facility] Phone [phone] Fax [phone] 06/02/2020 12:08 PM

## 2020-06-15 ENCOUNTER — Encounter (HOSPITAL_COMMUNITY): Payer: Self-pay

## 2020-06-16 ENCOUNTER — Other Ambulatory Visit (HOSPITAL_COMMUNITY): Payer: Self-pay

## 2020-06-16 DIAGNOSIS — C9200 Acute myeloblastic leukemia, not having achieved remission: Secondary | ICD-10-CM

## 2020-06-16 DIAGNOSIS — D46Z Other myelodysplastic syndromes: Secondary | ICD-10-CM

## 2020-06-16 MED ORDER — HYDROCODONE-ACETAMINOPHEN 5-325 MG PO TABS: 1.0000 | ORAL_TABLET | Freq: Four times a day (QID) | ORAL | 0 refills | Status: DC | PRN

## 2020-06-21 ENCOUNTER — Ambulatory Visit (HOSPITAL_COMMUNITY): Payer: Medicare Other

## 2020-06-21 ENCOUNTER — Inpatient Hospital Stay (HOSPITAL_COMMUNITY): Payer: Medicare Other

## 2020-06-21 ENCOUNTER — Ambulatory Visit (HOSPITAL_COMMUNITY): Payer: Medicare Other | Admitting: Hematology

## 2020-06-22 ENCOUNTER — Ambulatory Visit (HOSPITAL_COMMUNITY): Payer: Medicare Other

## 2020-06-23 ENCOUNTER — Ambulatory Visit (HOSPITAL_COMMUNITY): Payer: Medicare Other

## 2020-06-24 ENCOUNTER — Ambulatory Visit (HOSPITAL_COMMUNITY): Payer: Medicare Other

## 2020-06-24 NOTE — Progress Notes (Signed)
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[place])   08/22/2018 - 12/06/2018 Chemotherapy   The patient had palonosetron (ALOXI) injection 0.25 mg, 0.25 mg, Intravenous,  Once, 4 of 6 cycles Administration: 0.25 mg (08/22/2018), 0.25 mg (08/26/2018), 0.25 mg (08/28/2018), 0.25 mg (08/30/2018), 0.25 mg (10/01/2018), 0.25 mg (10/02/2018), 0.25 mg (11/04/2018), 0.25 mg (09/23/2018), 0.25 mg (09/25/2018), 0.25 mg (09/27/2018), 0.25 mg (10/28/2018), 0.25 mg (10/30/2018), 0.25 mg (11/01/2018), 0.25 mg (12/02/2018), 0.25 mg (12/04/2018), 0.25 mg (12/06/2018) azaCITIDine (VIDAZA) 100 mg in sodium chloride 0.9 % 50 mL chemo infusion, 110 mg (66.7 % of original dose 75 mg/m2), Intravenous, Once, 4 of 6 cycles Dose modification: 50 mg/m2 (original dose 75 mg/m2, Cycle 1, Reason: Provider Judgment), 50 mg/m2 (original dose 75 mg/m2, Cycle 2, Reason: Provider Judgment) Administration: 100 mg (08/22/2018), 100 mg (08/23/2018), 100 mg (08/26/2018), 100 mg (08/27/2018), 100 mg (08/28/2018), 100 mg (08/29/2018), 100 mg (08/30/2018), 100 mg (10/01/2018), 100 mg (10/02/2018), 100 mg (11/04/2018), 100 mg (11/05/2018), 100 mg (09/23/2018), 100 mg (09/24/2018), 100 mg (09/25/2018), 100 mg (09/26/2018), 100 mg (09/27/2018), 100 mg (10/28/2018), 100 mg (10/29/2018), 100 mg (10/30/2018), 100 mg (10/31/2018), 100 mg (11/01/2018), 100 mg (12/02/2018), 100 mg (12/03/2018), 100 mg (12/04/2018), 100 mg (12/05/2018), 100 mg (12/06/2018)  for chemotherapy treatment.    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  INTERVAL HISTORY:  Richard Wallace, a 75 y.o. male, returns for routine follow-up and consideration for next cycle of chemotherapy. Rodman was last seen on 05/10/2020.  Due for cycle #13 of decitabine today.   Overall, he tells me he has been feeling pretty well.  Reportedly his sister passed away last week.  He was standing most of the time at the funeral and has developed severe pain in the knee.  Now it is difficult for him to stand and walk.  Hence he is using motorized chair.  Received a booster shot for Covid recently.  Denies any fevers or infections since last cycle.  Overall, he feels ready for next cycle of chemo today.    REVIEW OF SYSTEMS:  Review of Systems  Musculoskeletal: Positive for arthralgias.  All other systems reviewed and are negative.   PAST MEDICAL/SURGICAL HISTORY:  Past Medical History:  Diagnosis Date  . Arthritis   . 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 Cancer ([place]) 01-28-2014   skin cancer  . CKD (chronic kidney disease), stage III ([place])   . GERD (gastroesophageal reflux disease)   . Heart murmur    NOTED DURING PHYSICAL WHEN HE WAS ENLISTING IN [place] , DIDNT KNOW UNTIL THAT TIME AND REPORTS , "THATS THE  LAST I HEARD ABOUT IT "   . History of hypertension    no longer issue  . History of kidney stones   . History of malignant melanoma of skin    excision top of scalp 2015-- no recurrence  . History of urinary retention    post op lumbar fusion surgery 04/ 2016  . Hypertension   . Kidney dysfunction    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, MONITORED BY [facility]  DR Franchot Gallo   . Left ureteral calculus   . Seasonal allergies   . Wears glasses   . Wears glasses   . Wears partial dentures    upper and lower   Past Surgical History:  Procedure Laterality Date  . ANKLE FUSION Right 2007  . CARPAL TUNNEL RELEASE Left 12/28/2009   w/ pulley release left long finger  . CARPAL TUNNEL RELEASE Right 07/22/2013   Procedure: RIGHT CARPAL TUNNEL RELEASE;  Surgeon: Cammie Sickle., MD;  Location: [facility];  Service: Orthopedics;  Laterality: Right;  . COLONOSCOPY    . CYSTO/  LEFT RETROGRADE PYELOGRAM  11/21/2010  . CYSTOSCOPY WITH STENT PLACEMENT Left 03/09/2016   Procedure: CYSTOSCOPY WITH STENT PLACEMENT;  Surgeon: Franchot Gallo, MD;  Location[facility];  Service: Urology;  Laterality: Left;  . CYSTOSCOPY/RETROGRADE/URETEROSCOPY/STONE EXTRACTION WITH BASKET Left 03/09/2016   Procedure: CYSTOSCOPY/RETROGRADE/URETEROSCOPY/STONE EXTRACTION WITH BASKET;  Surgeon: Franchot Gallo, MD;  Location: [facility];  Service: Urology;  Laterality: Left;  . LEFT URETEROSCOPIC LASER LITHOTRIPSY STONE EXTRACTION/ STENT PLACEMENT  05/23/2010  . MOHS SURGERY     TOP OF THE HEAD   . ORIF ANKLE FRACTURE Right 1978  . PORT-A-CATH REMOVAL Right 02/14/2019   Procedure: MINOR REMOVAL PORT-A-CATH;  Surgeon: Aviva Signs, MD;  Location: [facility];  Service: General;  Laterality: Right;  . PORTACATH PLACEMENT Right 08/19/2018   Procedure: INSERTION PORT-A-CATH (attached catheter in right subclavian);  Surgeon: Aviva Signs, MD;  Location: [facility];  Service: General;  Laterality: Right;  . PORTACATH PLACEMENT Left 05/16/2019   Procedure: INSERTION PORT-A-CATH (attached catheter in left subclavian);  Surgeon: Aviva Signs, MD;  Location: [facility];  Service: General;  Laterality: Left;  . POSTERIOR LUMBAR FUSION  08/21/2014   laminectomy and decompression L2 -- L5  . RIGHT LOWER LEG SURGERY  X3  1975 to 1976   including ORIF   . TONSILLECTOMY AND ADENOIDECTOMY  1986  . UMBILICAL HERNIA REPAIR  2009 approx    SOCIAL HISTORY:  Social History   Socioeconomic History  . Marital status: Married    Spouse name: Not on file  . Number of children: Not on file  . Years of education: Not on file  . Highest education level: Not on file  Occupational History  . Not on file  Tobacco Use  . Smoking status: Former Smoker    Years: 20.00    Types: Cigarettes    Quit date: 07/17/1986    Years since quitting: 33.9  . Smokeless tobacco: Never Used  Vaping Use  . Vaping Use: Never used  Substance and Sexual Activity  . Alcohol use: Yes    Alcohol/week: 7.0 - 14.0 standard drinks    Types: 7 - 14 Cans of beer per week    Comment: 1 -2 beer daily  . Drug use: No  . Sexual activity: Not Currently  Other Topics Concern  . Not on file  Social History Narrative  . Not on file   Social Determinants  of Health   Financial Resource Strain: Low Risk   . Difficulty of Paying Living Expenses: Not hard at all  Food Insecurity: No Food Insecurity  . Worried About Charity fundraiser in the Last Year: Never true  . Ran Out of Food in the Last Year: Never true  Transportation Needs: No Transportation Needs  . Lack of Transportation (Medical): No  . Lack of Transportation (Non-Medical): No  Physical Activity: Not on file  Stress: No Stress Concern Present  . Feeling of Stress : Not at all  Social Connections: Moderately Isolated  . Frequency of Communication with Friends and Family: Twice a week  . Frequency of Social Gatherings with Friends and Family: Never  . Attends Religious Services: Never  . Active Member of Clubs or Organizations: Yes  . Attends Archivist Meetings: More than 4 times per year  . Marital Status: Married  Human resources officer Violence: Not on file    FAMILY HISTORY:  Family History  Problem Relation Age of Onset  . Stroke Mother   . Prostate cancer Father   . Bone cancer Father   .  Diverticulitis Father   . Rheum arthritis Sister   . Urinary tract infection Sister   . Colon cancer Neg Hx     CURRENT MEDICATIONS:  Current Outpatient Medications  Medication Sig Dispense Refill  . acetaminophen (TYLENOL) 500 MG tablet Take 1,000 mg by mouth 3 (three) times daily.    Marland Kitchen allopurinol (ZYLOPRIM) 300 MG tablet Take 1 tablet (300 mg total) by mouth at bedtime. 30 tablet 3  . amLODipine (NORVASC) 2.5 MG tablet Take 1 tablet (2.5 mg total) by mouth daily. 90 tablet 2  . cetirizine (ZYRTEC) 10 MG tablet Take 10 mg by mouth daily. IN THE MORNING    . Cholecalciferol (VITAMIN D3) 1.25 MG (50000 UT) CAPS Take 1 capsule by mouth daily.    . diclofenac Sodium (VOLTAREN) 1 % GEL Apply three times daily to knees as needed for pain 50 g 3  . docusate sodium (COLACE) 100 MG capsule Take 100 mg by mouth at bedtime.     . donepezil (ARICEPT) 10 MG tablet Take 1 tablet (10 mg total) by mouth at bedtime. 30 tablet 6  . HYDROcodone-acetaminophen (NORCO) 5-325 MG tablet Take 1-2 tablets by mouth every 6 (six) hours as needed for moderate pain. 240 tablet 0  . lansoprazole (PREVACID) 15 MG capsule Take 15 mg by mouth daily.     Marland Kitchen lidocaine-prilocaine (EMLA) cream Apply 1 application topically See admin instructions. ONE HOUR PRIOR TO CHEMOTHERAPY APPOINTMENT    . magnesium oxide (MAG-OX) 400 (241.3 Mg) MG tablet Take 1 tablet (400 mg total) by mouth 3 (three) times daily. 90 tablet 3  . prochlorperazine (COMPAZINE) 10 MG tablet Take 1 tablet (10 mg total) by mouth every 6 (six) hours as needed for nausea or vomiting. 30 tablet 0  . SF 5000 PLUS 1.1 % CREA dental cream Place 1 application onto teeth at bedtime.    . tamsulosin (FLOMAX) 0.4 MG CAPS capsule Take 1 capsule (0.4 mg total) by mouth every evening. 30 capsule 2  . VENCLEXTA 100 MG tablet TAKE 2 TABLETS (200 MG) BY MOUTH DAILY 60 tablet 0   No current facility-administered medications for this visit.   Facility-Administered Medications  Ordered in Other Visits  Medication Dose Route Frequency Provider Last Rate Last Admin  . sodium chloride flush (NS) 0.9 % injection 10 mL  10 mL Intracatheter PRN Delton Coombes,  Dirk Dress, MD   10 mL at 11/21/19 1030  . sodium chloride flush (NS) 0.9 % injection 20 mL  20 mL Intravenous PRN [name], MD   20 mL at 07/21/19 1053    ALLERGIES:  No Known Allergies  PHYSICAL EXAM:  Performance status (ECOG): 1 - Symptomatic but completely ambulatory  There were no vitals filed for this visit. Wt Readings from Last 3 Encounters:  05/10/20 230 lb (104.3 kg)  03/22/20 231 lb 3.2 oz (104.9 kg)  03/03/20 231 lb 0.7 oz (104.8 kg)   Physical Exam Vitals reviewed.  Constitutional:      Appearance: Normal appearance.  Cardiovascular:     Rate and Rhythm: Normal rate and regular rhythm.     Heart sounds: Normal heart sounds.  Pulmonary:     Effort: Pulmonary effort is normal.     Breath sounds: Normal breath sounds.  Musculoskeletal:        General: Swelling present.  Skin:    General: Skin is warm.  Neurological:     General: No focal deficit present.     Mental Status: He is alert and oriented to person, place, and time.  Psychiatric:        Mood and Affect: Mood normal.        Behavior: Behavior normal.     LABORATORY DATA:  I have reviewed the labs as listed.  CBC Latest Ref Rng & Units 05/10/2020 04/28/2020 03/22/2020  WBC 4.0 - 10.5 K/uL 4.8 2.6(L) 3.8(L)  Hemoglobin 13.0 - 17.0 g/dL 14.5 14.6 14.2  Hematocrit 39.0 - 52.0 % 45.7 45.2 44.7  Platelets 150 - 400 K/uL 135(L) 136(L) 145(L)   CMP Latest Ref Rng & Units 05/10/2020 04/28/2020 03/22/2020  Glucose 70 - 99 mg/dL 92 97 104(H)  BUN 8 - 23 mg/dL 25(H) 18 24(H)  Creatinine 0.61 - 1.24 mg/dL 1.20 1.17 1.19  Sodium 135 - 145 mmol/L 141 137 141  Potassium 3.5 - 5.1 mmol/L 4.8 4.7 4.9  Chloride 98 - 111 mmol/L 106 101 106  CO2 22 - 32 mmol/L 28 26 27   Calcium 8.9 - 10.3 mg/dL 9.5 9.4 9.6  Total Protein 6.5 - 8.1  g/dL 6.4(L) 6.5 6.5  Total Bilirubin 0.3 - 1.2 mg/dL 0.5 0.7 0.6  Alkaline Phos 38 - 126 U/L 66 77 69  AST 15 - 41 U/L 26 36 27  ALT 0 - 44 U/L 34 43 34   Lab Results  Component Value Date   LDH 163 05/10/2020   LDH 160 03/22/2020   LDH 142 03/03/2020    DIAGNOSTIC IMAGING:  I have independently reviewed the scans and discussed with the patient. No results found.   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 -BMBX on 02/25/2019 after cycle 1 did not show any evidence of leukemia. -CT CAP on 02/12/2019 shows numerous bilateral irregular/spiculated pulmonary nodules measuring up to 14 mm, nonspecific. Hepatic steatosis. Spleen is normal. -I have talked to Dr.Ravandiat MD Ou[place] Sills per patient request. There are clinical trials available upon progression of his AML. There are also some clinical trials available now based on MRD positivity. Patient not interested in moving to [place] at this time.   PLAN:  1. AML: -I have reviewed his labs today.  LDH was normal.  LFTs are normal.  CBC shows normal white count with mild thrombocytopenia of 121.  Differential was normal. -He will proceed with  his next cycle of decitabine.  He will start venetoclax 200 mg daily for the next 2 weeks. -I plan to see him back in 6 weeks for follow-up.  2. Arthralgias: -Continue hydrocodone 5/325 2 tablets in the morning, 2 tablets at noon, 2 tablets in the evening and 1 tablet at bedtime.  3. Hypomagnesemia: -Continue magnesium 3 times a day.  4. Hypertension: -Continue Norvasc 2.5 mg daily.  5. CKD: -Creatinine is 1.12 and improved since lisinopril on hold.  6. Dementia: -Continue Aricept 10 mg daily which she is tolerating well.   Orders placed this encounter:  No orders of the defined types were placed in this encounter.    [name], MD [place] [phone]   I, Milinda Antis, am acting as a scribe for Dr. Sanda Linger.  I, [name] MD, have reviewed the above documentation for accuracy and completeness, and I agree with the above.

## 2020-06-25 ENCOUNTER — Ambulatory Visit (HOSPITAL_COMMUNITY): Payer: Medicare Other

## 2020-06-28 ENCOUNTER — Other Ambulatory Visit: Payer: Self-pay

## 2020-06-28 ENCOUNTER — Inpatient Hospital Stay (HOSPITAL_BASED_OUTPATIENT_CLINIC_OR_DEPARTMENT_OTHER): Payer: Medicare Other | Admitting: Hematology

## 2020-06-28 ENCOUNTER — Inpatient Hospital Stay (HOSPITAL_COMMUNITY): Payer: Medicare Other | Attending: Hematology

## 2020-06-28 ENCOUNTER — Inpatient Hospital Stay (HOSPITAL_COMMUNITY): Payer: Medicare Other

## 2020-06-28 VITALS — BP 104/65 | HR 55 | Temp 97.1°F | Resp 18

## 2020-06-28 DIAGNOSIS — C9201 Acute myeloblastic leukemia, in remission: Secondary | ICD-10-CM | POA: Diagnosis not present

## 2020-06-28 DIAGNOSIS — D46Z Other myelodysplastic syndromes: Secondary | ICD-10-CM

## 2020-06-28 DIAGNOSIS — F0390 Unspecified dementia without behavioral disturbance: Secondary | ICD-10-CM | POA: Insufficient documentation

## 2020-06-28 DIAGNOSIS — Z87891 Personal history of nicotine dependence: Secondary | ICD-10-CM | POA: Diagnosis not present

## 2020-06-28 DIAGNOSIS — C9200 Acute myeloblastic leukemia, not having achieved remission: Secondary | ICD-10-CM | POA: Insufficient documentation

## 2020-06-28 DIAGNOSIS — Z823 Family history of stroke: Secondary | ICD-10-CM | POA: Diagnosis not present

## 2020-06-28 DIAGNOSIS — I10 Essential (primary) hypertension: Secondary | ICD-10-CM | POA: Insufficient documentation

## 2020-06-28 DIAGNOSIS — Z9221 Personal history of antineoplastic chemotherapy: Secondary | ICD-10-CM | POA: Diagnosis not present

## 2020-06-28 DIAGNOSIS — Z8042 Family history of malignant neoplasm of prostate: Secondary | ICD-10-CM | POA: Diagnosis not present

## 2020-06-28 DIAGNOSIS — Z809 Family history of malignant neoplasm, unspecified: Secondary | ICD-10-CM | POA: Diagnosis not present

## 2020-06-28 DIAGNOSIS — Z79899 Other long term (current) drug therapy: Secondary | ICD-10-CM | POA: Insufficient documentation

## 2020-06-28 DIAGNOSIS — Z5111 Encounter for antineoplastic chemotherapy: Secondary | ICD-10-CM | POA: Diagnosis not present

## 2020-06-28 DIAGNOSIS — Z8261 Family history of arthritis: Secondary | ICD-10-CM | POA: Insufficient documentation

## 2020-06-28 DIAGNOSIS — Z95828 Presence of other vascular implants and grafts: Secondary | ICD-10-CM

## 2020-06-28 DIAGNOSIS — N183 Chronic kidney disease, stage 3 unspecified: Secondary | ICD-10-CM | POA: Insufficient documentation

## 2020-06-28 LAB — CBC WITH DIFFERENTIAL/PLATELET
Abs Immature Granulocytes: 0.1 10*3/uL — ABNORMAL HIGH (ref 0.00–0.07)
Basophils Absolute: 0 10*3/uL (ref 0.0–0.1)
Basophils Relative: 1 %
Eosinophils Absolute: 0.1 10*3/uL (ref 0.0–0.5)
Eosinophils Relative: 3 %
HCT: 44.7 % (ref 39.0–52.0)
Hemoglobin: 14.4 g/dL (ref 13.0–17.0)
Immature Granulocytes: 2 %
Lymphocytes Relative: 19 %
Lymphs Abs: 0.9 10*3/uL (ref 0.7–4.0)
MCH: 33 pg (ref 26.0–34.0)
MCHC: 32.2 g/dL (ref 30.0–36.0)
MCV: 102.3 fL — ABNORMAL HIGH (ref 80.0–100.0)
Monocytes Absolute: 0.5 10*3/uL (ref 0.1–1.0)
Monocytes Relative: 10 %
Neutro Abs: 2.9 10*3/uL (ref 1.7–7.7)
Neutrophils Relative %: 65 %
Platelets: 121 10*3/uL — ABNORMAL LOW (ref 150–400)
RBC: 4.37 MIL/uL (ref 4.22–5.81)
RDW: 15.2 % (ref 11.5–15.5)
WBC: 4.5 10*3/uL (ref 4.0–10.5)
nRBC: 0 % (ref 0.0–0.2)

## 2020-06-28 LAB — COMPREHENSIVE METABOLIC PANEL
ALT: 23 U/L (ref 0–44)
AST: 20 U/L (ref 15–41)
Albumin: 3.7 g/dL (ref 3.5–5.0)
Alkaline Phosphatase: 62 U/L (ref 38–126)
Anion gap: 6 (ref 5–15)
BUN: 18 mg/dL (ref 8–23)
CO2: 29 mmol/L (ref 22–32)
Calcium: 9.6 mg/dL (ref 8.9–10.3)
Chloride: 105 mmol/L (ref 98–111)
Creatinine, Ser: 1.12 mg/dL (ref 0.61–1.24)
GFR, Estimated: >60 mL/min (ref >60)
Glucose, Bld: 92 mg/dL (ref 70–99)
Potassium: 4.5 mmol/L (ref 3.5–5.1)
Sodium: 140 mmol/L (ref 135–145)
Total Bilirubin: 0.7 mg/dL (ref 0.3–1.2)
Total Protein: 6.3 g/dL — ABNORMAL LOW (ref 6.5–8.1)

## 2020-06-28 LAB — LACTATE DEHYDROGENASE: LDH: 155 U/L (ref 98–192)

## 2020-06-28 LAB — MAGNESIUM: Magnesium: 1.9 mg/dL (ref 1.7–2.4)

## 2020-06-28 MED ORDER — SODIUM CHLORIDE 0.9 % IV SOLN
8.0000 mg | Freq: Once | INTRAVENOUS | Status: AC
  Administered 2020-06-28: 8 mg via INTRAVENOUS
  Filled 2020-06-28: qty 4

## 2020-06-28 MED ORDER — SODIUM CHLORIDE 0.9 % IV SOLN: Freq: Once | INTRAVENOUS | Status: AC

## 2020-06-28 MED ORDER — SODIUM CHLORIDE 0.9 % IV SOLN
15.0000 mg/m2 | Freq: Once | INTRAVENOUS | Status: AC
  Administered 2020-06-28: 35 mg via INTRAVENOUS
  Filled 2020-06-28: qty 7

## 2020-06-28 MED ORDER — HEPARIN SOD (PORK) LOCK FLUSH 100 UNIT/ML IV SOLN
500.0000 [IU] | Freq: Once | INTRAVENOUS | Status: AC | PRN
  Administered 2020-06-28: 500 [IU]

## 2020-06-28 MED ORDER — SODIUM CHLORIDE 0.9% FLUSH: 10.0000 mL | INTRAVENOUS | Status: DC | PRN

## 2020-06-28 NOTE — Progress Notes (Signed)
Patient was assessed by Dr. Katragadda and labs have been reviewed.  Patient is okay to proceed with treatment today. Primary RN and pharmacy aware.   

## 2020-06-28 NOTE — Progress Notes (Signed)
Patient presents today for Decitabine.  Vital signs within parameters.  Labs pending.  Patients only complaint is his knee.  He spent a lot of time standing over the weekend and now it is swollen and he can't walk.    Labs within parameters for treatment.  Message received from [facility]/Dr. Delton Coombes patient okay for treatment.  Treatment given today per MD orders.  Tolerated infusion without adverse affects.  Vital signs stable.  No complaints at this time.  Discharge from clinic ambulatory in stable condition.  Alert and oriented X 3.  Follow up with [facility] as scheduled.

## 2020-06-28 NOTE — Patient Instructions (Signed)
[facility] Discharge Instructions for Patients Receiving Chemotherapy  Today you received the following chemotherapy agents   To help prevent nausea and vomiting after your treatment, we encourage you to take your nausea medication   If you develop nausea and vomiting that is not controlled by your nausea medication, call the clinic.   BELOW ARE SYMPTOMS THAT SHOULD BE REPORTED IMMEDIATELY:  *FEVER GREATER THAN 100.5 F  *CHILLS WITH OR WITHOUT FEVER  NAUSEA AND VOMITING THAT IS NOT CONTROLLED WITH YOUR NAUSEA MEDICATION  *UNUSUAL SHORTNESS OF BREATH  *UNUSUAL BRUISING OR BLEEDING  TENDERNESS IN MOUTH AND THROAT WITH OR WITHOUT PRESENCE OF ULCERS  *URINARY PROBLEMS  *BOWEL PROBLEMS  UNUSUAL RASH Items with * indicate a potential emergency and should be followed up as soon as possible.  Feel free to call the clinic should you have any questions or concerns. The clinic phone number is [phone].  Please show the CHEMO ALERT CARD at check-in to the Emergency Department and triage nurse.   

## 2020-06-28 NOTE — Patient Instructions (Signed)
[facility] Discharge Instructions  You were seen and examined today by Dr. Katragadda. Please follow up as scheduled.    Thank you for choosing [facility] at [facility] to provide your oncology and hematology care.  To afford each patient quality time with our provider, please arrive at least 15 minutes before your scheduled appointment time.   If you have a lab appointment with the Cancer Center please come in thru the Main Entrance and check in at the main information desk.  You need to re-schedule your appointment should you arrive 10 or more minutes late.  We strive to give you quality time with our providers, and arriving late affects you and other patients whose appointments are after yours.  Also, if you no show three or more times for appointments you may be dismissed from the clinic at the providers discretion.     Again, thank you for choosing [facility].  Our hope is that these requests will decrease the amount of time that you wait before being seen by our physicians.       _____________________________________________________________  Should you have questions after your visit to [facility], please contact our office at [phone] and follow the prompts.  Our office hours are 8:00 a.m. and 4:30 p.m. Monday - Friday.  Please note that voicemails left after 4:00 p.m. may not be returned until the following business day.  We are closed weekends and major holidays.  You do have access to a nurse 24-7, just call the main number to the clinic [phone] and do not press any options, hold on the line and a nurse will answer the phone.    For prescription refill requests, have your pharmacy contact our office and allow 72 hours.    Due to Covid, you will need to wear a mask upon entering the hospital. If you do not have a mask, a mask will be given to you at the Main Entrance upon arrival. For  doctor visits, patients may have 1 support person age 18 or older with them. For treatment visits, patients can not have anyone with them due to social distancing guidelines and our immunocompromised population.      

## 2020-06-29 ENCOUNTER — Inpatient Hospital Stay (HOSPITAL_COMMUNITY): Payer: Medicare Other

## 2020-06-29 ENCOUNTER — Other Ambulatory Visit: Payer: Self-pay

## 2020-06-29 VITALS — BP 104/66 | HR 56 | Temp 97.1°F | Resp 18

## 2020-06-29 DIAGNOSIS — Z5111 Encounter for antineoplastic chemotherapy: Secondary | ICD-10-CM | POA: Diagnosis not present

## 2020-06-29 DIAGNOSIS — Z95828 Presence of other vascular implants and grafts: Secondary | ICD-10-CM

## 2020-06-29 DIAGNOSIS — C9200 Acute myeloblastic leukemia, not having achieved remission: Secondary | ICD-10-CM

## 2020-06-29 DIAGNOSIS — D46Z Other myelodysplastic syndromes: Secondary | ICD-10-CM

## 2020-06-29 MED ORDER — SODIUM CHLORIDE 0.9% FLUSH
10.0000 mL | INTRAVENOUS | Status: DC | PRN
  Administered 2020-06-29: 10 mL

## 2020-06-29 MED ORDER — SODIUM CHLORIDE 0.9 % IV SOLN: Freq: Once | INTRAVENOUS | Status: AC

## 2020-06-29 MED ORDER — SODIUM CHLORIDE 0.9 % IV SOLN
8.0000 mg | Freq: Once | INTRAVENOUS | Status: AC
  Administered 2020-06-29: 8 mg via INTRAVENOUS
  Filled 2020-06-29: qty 4

## 2020-06-29 MED ORDER — HEPARIN SOD (PORK) LOCK FLUSH 100 UNIT/ML IV SOLN
500.0000 [IU] | Freq: Once | INTRAVENOUS | Status: AC | PRN
  Administered 2020-06-29: 500 [IU]

## 2020-06-29 MED ORDER — SODIUM CHLORIDE 0.9 % IV SOLN
15.0000 mg/m2 | Freq: Once | INTRAVENOUS | Status: AC
  Administered 2020-06-29: 35 mg via INTRAVENOUS
  Filled 2020-06-29: qty 7

## 2020-06-29 NOTE — Progress Notes (Signed)
Patient presents for Day 2 Decitabine infusion.  Vital signs within parameters for treatment.  No new complaints since last visit.  Decitabine given today per MD orders.  Tolerated infusion without adverse affects.  Vital signs stable.  No complaints at this time.  Discharge from clinic via wheelchair in stable condition.  Alert and oriented X 3.  Follow up with [facility] as scheduled.

## 2020-06-29 NOTE — Patient Instructions (Signed)
[facility] Discharge Instructions for Patients Receiving Chemotherapy  Today you received the following chemotherapy agents   To help prevent nausea and vomiting after your treatment, we encourage you to take your nausea medication   If you develop nausea and vomiting that is not controlled by your nausea medication, call the clinic.   BELOW ARE SYMPTOMS THAT SHOULD BE REPORTED IMMEDIATELY:  *FEVER GREATER THAN 100.5 F  *CHILLS WITH OR WITHOUT FEVER  NAUSEA AND VOMITING THAT IS NOT CONTROLLED WITH YOUR NAUSEA MEDICATION  *UNUSUAL SHORTNESS OF BREATH  *UNUSUAL BRUISING OR BLEEDING  TENDERNESS IN MOUTH AND THROAT WITH OR WITHOUT PRESENCE OF ULCERS  *URINARY PROBLEMS  *BOWEL PROBLEMS  UNUSUAL RASH Items with * indicate a potential emergency and should be followed up as soon as possible.  Feel free to call the clinic should you have any questions or concerns. The clinic phone number is [phone].  Please show the CHEMO ALERT CARD at check-in to the Emergency Department and triage nurse.   

## 2020-06-30 ENCOUNTER — Inpatient Hospital Stay (HOSPITAL_COMMUNITY): Payer: Medicare Other

## 2020-06-30 VITALS — BP 177/71 | HR 83 | Temp 96.8°F | Resp 18

## 2020-06-30 DIAGNOSIS — C9200 Acute myeloblastic leukemia, not having achieved remission: Secondary | ICD-10-CM

## 2020-06-30 DIAGNOSIS — D46Z Other myelodysplastic syndromes: Secondary | ICD-10-CM

## 2020-06-30 DIAGNOSIS — Z95828 Presence of other vascular implants and grafts: Secondary | ICD-10-CM

## 2020-06-30 DIAGNOSIS — Z5111 Encounter for antineoplastic chemotherapy: Secondary | ICD-10-CM | POA: Diagnosis not present

## 2020-06-30 MED ORDER — HEPARIN SOD (PORK) LOCK FLUSH 100 UNIT/ML IV SOLN
500.0000 [IU] | Freq: Once | INTRAVENOUS | Status: AC | PRN
  Administered 2020-06-30: 500 [IU]

## 2020-06-30 MED ORDER — SODIUM CHLORIDE 0.9% FLUSH
10.0000 mL | INTRAVENOUS | Status: DC | PRN
  Administered 2020-06-30: 10 mL

## 2020-06-30 MED ORDER — SODIUM CHLORIDE 0.9 % IV SOLN
8.0000 mg | Freq: Once | INTRAVENOUS | Status: AC
  Administered 2020-06-30: 8 mg via INTRAVENOUS
  Filled 2020-06-30: qty 4

## 2020-06-30 MED ORDER — SODIUM CHLORIDE 0.9 % IV SOLN
15.0000 mg/m2 | Freq: Once | INTRAVENOUS | Status: AC
  Administered 2020-06-30: 35 mg via INTRAVENOUS
  Filled 2020-06-30: qty 7

## 2020-06-30 MED ORDER — SODIUM CHLORIDE 0.9 % IV SOLN: Freq: Once | INTRAVENOUS | Status: AC

## 2020-06-30 NOTE — Patient Instructions (Signed)
[facility] Discharge Instructions for Patients Receiving Chemotherapy  Today you received the following chemotherapy agents   To help prevent nausea and vomiting after your treatment, we encourage you to take your nausea medication   If you develop nausea and vomiting that is not controlled by your nausea medication, call the clinic.   BELOW ARE SYMPTOMS THAT SHOULD BE REPORTED IMMEDIATELY:  *FEVER GREATER THAN 100.5 F  *CHILLS WITH OR WITHOUT FEVER  NAUSEA AND VOMITING THAT IS NOT CONTROLLED WITH YOUR NAUSEA MEDICATION  *UNUSUAL SHORTNESS OF BREATH  *UNUSUAL BRUISING OR BLEEDING  TENDERNESS IN MOUTH AND THROAT WITH OR WITHOUT PRESENCE OF ULCERS  *URINARY PROBLEMS  *BOWEL PROBLEMS  UNUSUAL RASH Items with * indicate a potential emergency and should be followed up as soon as possible.  Feel free to call the clinic should you have any questions or concerns. The clinic phone number is [phone].  Please show the CHEMO ALERT CARD at check-in to the Emergency Department and triage nurse.   

## 2020-06-30 NOTE — Progress Notes (Signed)
Patient is presenting for day 3 Decitabine.  Vital signs within parameters for treatment.  No new complaints since last visit.  Decitabine infusion given today per MD orders.  Tolerated infusion without adverse affects.  Vital signs stable.  No complaints at this time.  Discharge from clinic ambulatory in stable condition.  Alert and oriented X 3.  Follow up with [facility] as scheduled.

## 2020-07-01 ENCOUNTER — Other Ambulatory Visit: Payer: Self-pay

## 2020-07-01 ENCOUNTER — Inpatient Hospital Stay (HOSPITAL_COMMUNITY): Payer: Medicare Other

## 2020-07-01 VITALS — BP 136/76 | HR 77 | Temp 96.8°F | Resp 18

## 2020-07-01 DIAGNOSIS — Z5111 Encounter for antineoplastic chemotherapy: Secondary | ICD-10-CM | POA: Diagnosis not present

## 2020-07-01 DIAGNOSIS — Z95828 Presence of other vascular implants and grafts: Secondary | ICD-10-CM

## 2020-07-01 DIAGNOSIS — C9200 Acute myeloblastic leukemia, not having achieved remission: Secondary | ICD-10-CM

## 2020-07-01 DIAGNOSIS — D46Z Other myelodysplastic syndromes: Secondary | ICD-10-CM

## 2020-07-01 MED ORDER — HEPARIN SOD (PORK) LOCK FLUSH 100 UNIT/ML IV SOLN
500.0000 [IU] | Freq: Once | INTRAVENOUS | Status: AC | PRN
  Administered 2020-07-01: 500 [IU]

## 2020-07-01 MED ORDER — SODIUM CHLORIDE 0.9 % IV SOLN
8.0000 mg | Freq: Once | INTRAVENOUS | Status: AC
  Administered 2020-07-01: 8 mg via INTRAVENOUS
  Filled 2020-07-01: qty 4

## 2020-07-01 MED ORDER — SODIUM CHLORIDE 0.9 % IV SOLN
15.0000 mg/m2 | Freq: Once | INTRAVENOUS | Status: AC
  Administered 2020-07-01: 35 mg via INTRAVENOUS
  Filled 2020-07-01: qty 7

## 2020-07-01 MED ORDER — SODIUM CHLORIDE 0.9 % IV SOLN: Freq: Once | INTRAVENOUS | Status: AC

## 2020-07-01 MED ORDER — SODIUM CHLORIDE 0.9% FLUSH
10.0000 mL | INTRAVENOUS | Status: DC | PRN
  Administered 2020-07-01: 10 mL

## 2020-07-01 NOTE — Patient Instructions (Signed)
[facility] Discharge Instructions for Patients Receiving Chemotherapy  Today you received the following chemotherapy agents   To help prevent nausea and vomiting after your treatment, we encourage you to take your nausea medication   If you develop nausea and vomiting that is not controlled by your nausea medication, call the clinic.   BELOW ARE SYMPTOMS THAT SHOULD BE REPORTED IMMEDIATELY:  *FEVER GREATER THAN 100.5 F  *CHILLS WITH OR WITHOUT FEVER  NAUSEA AND VOMITING THAT IS NOT CONTROLLED WITH YOUR NAUSEA MEDICATION  *UNUSUAL SHORTNESS OF BREATH  *UNUSUAL BRUISING OR BLEEDING  TENDERNESS IN MOUTH AND THROAT WITH OR WITHOUT PRESENCE OF ULCERS  *URINARY PROBLEMS  *BOWEL PROBLEMS  UNUSUAL RASH Items with * indicate a potential emergency and should be followed up as soon as possible.  Feel free to call the clinic should you have any questions or concerns. The clinic phone number is [phone].  Please show the CHEMO ALERT CARD at check-in to the Emergency Department and triage nurse.   

## 2020-07-01 NOTE — Progress Notes (Signed)
Patient presents today for Day 4 Decitabine.  Vital signs within parameters for treatment.  No new complaints since last visit.  Treatment given today per MD orders.  Tolerated infusion without adverse affects.  Vital signs stable.  No complaints at this time.  Discharge from clinic ambulatory in stable condition.  Alert and oriented X 3.  Follow up with [facility] as scheduled.

## 2020-07-02 ENCOUNTER — Inpatient Hospital Stay (HOSPITAL_COMMUNITY): Payer: Medicare Other

## 2020-07-02 VITALS — BP 143/83 | HR 62 | Temp 97.1°F | Resp 18

## 2020-07-02 DIAGNOSIS — D46Z Other myelodysplastic syndromes: Secondary | ICD-10-CM

## 2020-07-02 DIAGNOSIS — Z95828 Presence of other vascular implants and grafts: Secondary | ICD-10-CM

## 2020-07-02 DIAGNOSIS — Z5111 Encounter for antineoplastic chemotherapy: Secondary | ICD-10-CM | POA: Diagnosis not present

## 2020-07-02 DIAGNOSIS — C9200 Acute myeloblastic leukemia, not having achieved remission: Secondary | ICD-10-CM

## 2020-07-02 MED ORDER — DECITABINE CHEMO INJECTION 50 MG
15.0000 mg/m2 | Freq: Once | INTRAVENOUS | Status: AC
  Administered 2020-07-02: 35 mg via INTRAVENOUS
  Filled 2020-07-02: qty 7

## 2020-07-02 MED ORDER — SODIUM CHLORIDE 0.9% FLUSH
10.0000 mL | INTRAVENOUS | Status: DC | PRN
  Administered 2020-07-02: 10 mL

## 2020-07-02 MED ORDER — HEPARIN SOD (PORK) LOCK FLUSH 100 UNIT/ML IV SOLN
500.0000 [IU] | Freq: Once | INTRAVENOUS | Status: AC | PRN
  Administered 2020-07-02: 500 [IU]

## 2020-07-02 MED ORDER — SODIUM CHLORIDE 0.9 % IV SOLN
8.0000 mg | Freq: Once | INTRAVENOUS | Status: AC
  Administered 2020-07-02: 8 mg via INTRAVENOUS
  Filled 2020-07-02: qty 4

## 2020-07-02 MED ORDER — SODIUM CHLORIDE 0.9 % IV SOLN: Freq: Once | INTRAVENOUS | Status: AC

## 2020-07-02 NOTE — Progress Notes (Signed)
Patient presents for Day 5 Decitabine.  Vital signs within parameters for treatment.  No new complaints since last visit.  Treatment given today per MD orders.  Tolerated infusion without adverse affects.  Vital signs stable.  No complaints at this time.  Discharge from clinic ambulatory in stable condition.  Alert and oriented X 3.  Follow up with [facility] as scheduled.

## 2020-07-02 NOTE — Patient Instructions (Signed)
[facility] Discharge Instructions for Patients Receiving Chemotherapy  Today you received the following   To help prevent nausea and vomiting after your treatment, we encourage you to take your nausea medication    If you develop nausea and vomiting that is not controlled by your nausea medication, call the clinic.   BELOW ARE SYMPTOMS THAT SHOULD BE REPORTED IMMEDIATELY:  *FEVER GREATER THAN 100.5 F  *CHILLS WITH OR WITHOUT FEVER  NAUSEA AND VOMITING THAT IS NOT CONTROLLED WITH YOUR NAUSEA MEDICATION  *UNUSUAL SHORTNESS OF BREATH  *UNUSUAL BRUISING OR BLEEDING  TENDERNESS IN MOUTH AND THROAT WITH OR WITHOUT PRESENCE OF ULCERS  *URINARY PROBLEMS  *BOWEL PROBLEMS  UNUSUAL RASH Items with * indicate a potential emergency and should be followed up as soon as possible.  Feel free to call the clinic should you have any questions or concerns. The clinic phone number is [phone].  Please show the [place] at check-in to the Emergency Department and triage nurse.

## 2020-07-26 ENCOUNTER — Other Ambulatory Visit (HOSPITAL_COMMUNITY): Payer: Self-pay

## 2020-07-26 DIAGNOSIS — C9200 Acute myeloblastic leukemia, not having achieved remission: Secondary | ICD-10-CM

## 2020-07-26 DIAGNOSIS — D46Z Other myelodysplastic syndromes: Secondary | ICD-10-CM

## 2020-07-26 MED ORDER — HYDROCODONE-ACETAMINOPHEN 5-325 MG PO TABS: 1.0000 | ORAL_TABLET | Freq: Four times a day (QID) | ORAL | 0 refills | Status: DC | PRN

## 2020-07-29 ENCOUNTER — Other Ambulatory Visit (HOSPITAL_COMMUNITY): Payer: Self-pay

## 2020-07-29 DIAGNOSIS — C9200 Acute myeloblastic leukemia, not having achieved remission: Secondary | ICD-10-CM

## 2020-07-30 ENCOUNTER — Other Ambulatory Visit (HOSPITAL_COMMUNITY): Payer: Self-pay

## 2020-08-03 ENCOUNTER — Encounter (HOSPITAL_COMMUNITY): Payer: Self-pay

## 2020-08-09 ENCOUNTER — Inpatient Hospital Stay (HOSPITAL_BASED_OUTPATIENT_CLINIC_OR_DEPARTMENT_OTHER): Payer: Medicare Other | Admitting: Hematology

## 2020-08-09 ENCOUNTER — Encounter (HOSPITAL_COMMUNITY): Payer: Self-pay | Admitting: Hematology

## 2020-08-09 ENCOUNTER — Inpatient Hospital Stay (HOSPITAL_COMMUNITY): Payer: Medicare Other | Attending: Hematology

## 2020-08-09 ENCOUNTER — Other Ambulatory Visit: Payer: Self-pay

## 2020-08-09 ENCOUNTER — Inpatient Hospital Stay (HOSPITAL_COMMUNITY): Payer: Medicare Other

## 2020-08-09 VITALS — BP 120/88 | HR 81 | Temp 97.1°F | Resp 18 | Wt 226.0 lb

## 2020-08-09 VITALS — BP 140/77 | HR 63 | Temp 97.2°F | Resp 18

## 2020-08-09 DIAGNOSIS — Z85820 Personal history of malignant melanoma of skin: Secondary | ICD-10-CM | POA: Insufficient documentation

## 2020-08-09 DIAGNOSIS — Z5111 Encounter for antineoplastic chemotherapy: Secondary | ICD-10-CM | POA: Diagnosis not present

## 2020-08-09 DIAGNOSIS — Z8261 Family history of arthritis: Secondary | ICD-10-CM | POA: Insufficient documentation

## 2020-08-09 DIAGNOSIS — M25562 Pain in left knee: Secondary | ICD-10-CM | POA: Diagnosis not present

## 2020-08-09 DIAGNOSIS — F0390 Unspecified dementia without behavioral disturbance: Secondary | ICD-10-CM | POA: Diagnosis not present

## 2020-08-09 DIAGNOSIS — M25561 Pain in right knee: Secondary | ICD-10-CM | POA: Diagnosis not present

## 2020-08-09 DIAGNOSIS — I10 Essential (primary) hypertension: Secondary | ICD-10-CM | POA: Insufficient documentation

## 2020-08-09 DIAGNOSIS — C9200 Acute myeloblastic leukemia, not having achieved remission: Secondary | ICD-10-CM | POA: Insufficient documentation

## 2020-08-09 DIAGNOSIS — C9201 Acute myeloblastic leukemia, in remission: Secondary | ICD-10-CM

## 2020-08-09 DIAGNOSIS — N183 Chronic kidney disease, stage 3 unspecified: Secondary | ICD-10-CM | POA: Insufficient documentation

## 2020-08-09 DIAGNOSIS — Z79899 Other long term (current) drug therapy: Secondary | ICD-10-CM | POA: Diagnosis not present

## 2020-08-09 DIAGNOSIS — Z87891 Personal history of nicotine dependence: Secondary | ICD-10-CM | POA: Diagnosis not present

## 2020-08-09 DIAGNOSIS — Z808 Family history of malignant neoplasm of other organs or systems: Secondary | ICD-10-CM | POA: Diagnosis not present

## 2020-08-09 DIAGNOSIS — D46Z Other myelodysplastic syndromes: Secondary | ICD-10-CM

## 2020-08-09 DIAGNOSIS — Z8042 Family history of malignant neoplasm of prostate: Secondary | ICD-10-CM | POA: Diagnosis not present

## 2020-08-09 DIAGNOSIS — Z95828 Presence of other vascular implants and grafts: Secondary | ICD-10-CM

## 2020-08-09 LAB — COMPREHENSIVE METABOLIC PANEL
ALT: 30 U/L (ref 0–44)
AST: 30 U/L (ref 15–41)
Albumin: 3.8 g/dL (ref 3.5–5.0)
Alkaline Phosphatase: 69 U/L (ref 38–126)
Anion gap: 10 (ref 5–15)
BUN: 20 mg/dL (ref 8–23)
CO2: 25 mmol/L (ref 22–32)
Calcium: 9.3 mg/dL (ref 8.9–10.3)
Chloride: 107 mmol/L (ref 98–111)
Creatinine, Ser: 1.02 mg/dL (ref 0.61–1.24)
GFR, Estimated: >60 mL/min (ref >60)
Glucose, Bld: 117 mg/dL — ABNORMAL HIGH (ref 70–99)
Potassium: 4.4 mmol/L (ref 3.5–5.1)
Sodium: 142 mmol/L (ref 135–145)
Total Bilirubin: 0.8 mg/dL (ref 0.3–1.2)
Total Protein: 6.3 g/dL — ABNORMAL LOW (ref 6.5–8.1)

## 2020-08-09 LAB — CBC WITH DIFFERENTIAL/PLATELET
Abs Immature Granulocytes: 0.2 10*3/uL — ABNORMAL HIGH (ref 0.00–0.07)
Basophils Absolute: 0.1 10*3/uL (ref 0.0–0.1)
Basophils Relative: 1 %
Eosinophils Absolute: 0.1 10*3/uL (ref 0.0–0.5)
Eosinophils Relative: 2 %
HCT: 45 % (ref 39.0–52.0)
Hemoglobin: 14.4 g/dL (ref 13.0–17.0)
Immature Granulocytes: 4 %
Lymphocytes Relative: 18 %
Lymphs Abs: 0.9 10*3/uL (ref 0.7–4.0)
MCH: 32.3 pg (ref 26.0–34.0)
MCHC: 32 g/dL (ref 30.0–36.0)
MCV: 100.9 fL — ABNORMAL HIGH (ref 80.0–100.0)
Monocytes Absolute: 0.4 10*3/uL (ref 0.1–1.0)
Monocytes Relative: 8 %
Neutro Abs: 3.4 10*3/uL (ref 1.7–7.7)
Neutrophils Relative %: 67 %
Platelets: 121 10*3/uL — ABNORMAL LOW (ref 150–400)
RBC: 4.46 MIL/uL (ref 4.22–5.81)
RDW: 15.4 % (ref 11.5–15.5)
WBC: 5.1 10*3/uL (ref 4.0–10.5)
nRBC: 0 % (ref 0.0–0.2)

## 2020-08-09 LAB — LACTATE DEHYDROGENASE: LDH: 189 U/L (ref 98–192)

## 2020-08-09 LAB — MAGNESIUM: Magnesium: 1.9 mg/dL (ref 1.7–2.4)

## 2020-08-09 MED ORDER — LANREOTIDE ACETATE 120 MG/0.5ML ~~LOC~~ SOLN
SUBCUTANEOUS | Status: AC
  Filled 2020-08-09: qty 120

## 2020-08-09 MED ORDER — HEPARIN SOD (PORK) LOCK FLUSH 100 UNIT/ML IV SOLN
500.0000 [IU] | Freq: Once | INTRAVENOUS | Status: AC | PRN
  Administered 2020-08-09: 500 [IU]

## 2020-08-09 MED ORDER — ONDANSETRON HCL 40 MG/20ML IJ SOLN
8.0000 mg | Freq: Once | INTRAMUSCULAR | Status: AC
  Administered 2020-08-09: 8 mg via INTRAVENOUS
  Filled 2020-08-09: qty 4

## 2020-08-09 MED ORDER — SODIUM CHLORIDE 0.9% FLUSH
10.0000 mL | INTRAVENOUS | Status: DC | PRN
  Administered 2020-08-09: 10 mL

## 2020-08-09 MED ORDER — SODIUM CHLORIDE 0.9 % IV SOLN: Freq: Once | INTRAVENOUS | Status: AC

## 2020-08-09 MED ORDER — SODIUM CHLORIDE 0.9 % IV SOLN
15.0000 mg/m2 | Freq: Once | INTRAVENOUS | Status: AC
  Administered 2020-08-09: 35 mg via INTRAVENOUS
  Filled 2020-08-09: qty 7

## 2020-08-09 NOTE — Patient Instructions (Signed)
Decitabine injection for infusion What is this medicine? DECITABINE (dee SYE ta been) is a chemotherapy drug. This medicine reduces the growth of cancer cells. It is used to treat adults with myelodysplastic syndromes. This medicine may be used for other purposes; ask your health care provider or pharmacist if you have questions. COMMON BRAND NAME(S): Dacogen What should I tell my health care provider before I take this medicine? They need to know if you have any of these conditions:  infection (especially a virus infection such as chickenpox, cold sores, or herpes)  kidney disease  liver disease  an unusual or allergic reaction to decitabine, other medicines, foods, dyes, or preservatives  pregnant or trying to get pregnant  breast-feeding How should I use this medicine? This medicine is for infusion into a vein. It is administered in a hospital or clinic by a doctor or health care professional. Talk to your pediatrician regarding the use of this medicine in children. Special care may be needed. Overdosage: If you think you have taken too much of this medicine contact a poison control center or emergency room at once. NOTE: This medicine is only for you. Do not share this medicine with others. What if I miss a dose? It is important not to miss your dose. Call your doctor or health care professional if you are unable to keep an appointment. What may interact with this medicine?  vaccines Talk to your doctor or health care professional before taking any of these medicines:  aspirin  acetaminophen  ibuprofen  ketoprofen  naproxen This list may not describe all possible interactions. Give your health care provider a list of all the medicines, herbs, non-prescription drugs, or dietary supplements you use. Also tell them if you smoke, drink alcohol, or use illegal drugs. Some items may interact with your medicine. What should I watch for while using this medicine? Visit your  doctor for checks on your progress. This drug may make you feel generally unwell. This is not uncommon, as chemotherapy can affect healthy cells as well as cancer cells. Report any side effects. Continue your course of treatment even though you feel ill unless your doctor tells you to stop. You may need blood work done while you are taking this medicine. In some cases, you may be given additional medicines to help with side effects. Follow all directions for their use. Call your doctor or health care professional for advice if you get a fever, chills or sore throat, or other symptoms of a cold or flu. Do not treat yourself. This drug decreases your body's ability to fight infections. Try to avoid being around people who are sick. This medicine may increase your risk to bruise or bleed. Call your doctor or health care professional if you notice any unusual bleeding. Do not become pregnant while taking this medicine or for 6 months after stopping it. Women should inform their doctor if they wish to become pregnant or think they might be pregnant. Men should not father a child while taking this medicine and for 3 months after stopping it. There is a potential for serious side effects to an unborn child. Talk to your health care professional or pharmacist for more information. Do not breast-feed an infant while taking this medicine or for at least 2 weeks after stopping it. In males, this medicine may interfere with the ability to father a child. Talk with your doctor or health care professional if you are concerned about your fertility. What side effects may I  notice from receiving this medicine? Side effects that you should report to your doctor or health care professional as soon as possible:  low blood counts - this medicine may decrease the number of white blood cells, red blood cells and platelets. You may be at increased risk for infections and bleeding.  signs of infection - fever or chills, cough,  sore throat, pain or difficulty passing urine  signs of decreased platelets or bleeding - bruising, pinpoint red spots on the skin, black, tarry stools, blood in the urine  signs of decreased red blood cells - unusual weakness or tiredness, fainting spells, lightheadedness  increased blood sugar Side effects that usually do not require medical attention (report to your doctor or health care professional if they continue or are bothersome):  constipation  diarrhea  headache  loss of appetite  nausea, vomiting  skin rash, itching  stomach pain  water retention  weak or tired This list may not describe all possible side effects. Call your doctor for medical advice about side effects. You may report side effects to FDA at 1-800-FDA-1088. Where should I keep my medicine? This drug is given in a hospital or clinic and will not be stored at home. NOTE: This sheet is a summary. It may not cover all possible information. If you have questions about this medicine, talk to your doctor, pharmacist, or health care provider.  2021 [employer] Standard (2018-07-05 13:32:17)

## 2020-08-09 NOTE — Patient Instructions (Signed)
[place] at [facility] Discharge Instructions  You were seen today by Dr. Delton Coombes. He went over your recent results. 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 You may take turmeric daily and drink plenty of water to keep your kidneys flushed. Dr. Delton Coombes will see you back in 6 weeks for labs and follow up.   Thank you for choosing [place] at [facility] to provide your oncology and hematology care.  To afford each patient quality time with our provider, please arrive at least 15 minutes before your scheduled appointment time.   If you have a lab appointment with the [facility] please come in thru the Main Entrance and check in at the main information desk  You need to re-schedule your appointment should you arrive 10 or more minutes late.  We strive to give you quality time with our providers, and arriving late affects you and other patients whose appointments are after yours.  Also, if you no show three or more times for appointments you may be dismissed from the clinic at the providers discretion.     Again, thank you for choosing [facility].  Our hope is that these requests will decrease the amount of time that you wait before being seen by our physicians.       _____________________________________________________________  Should you have questions after your visit to [facility], please contact our office at [phone] between the hours of 8:00 a.m. and 4:30 p.m.  Voicemails left after 4:00 p.m. will not be returned until the following business day.  For prescription refill requests, have your pharmacy contact our office and allow 72 hours.    Cancer Center Support Programs:   > Cancer Support Group  2nd Tuesday of the month 1pm-2pm, Journey Room

## 2020-08-09 NOTE — Progress Notes (Signed)
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[place])   08/22/2018 - 12/06/2018 Chemotherapy   The patient had palonosetron (ALOXI) injection 0.25 mg, 0.25 mg, Intravenous,  Once, 4 of 6 cycles Administration: 0.25 mg (08/22/2018), 0.25 mg (08/26/2018), 0.25 mg (08/28/2018), 0.25 mg (08/30/2018), 0.25 mg (10/01/2018), 0.25 mg (10/02/2018), 0.25 mg (11/04/2018), 0.25 mg (09/23/2018), 0.25 mg (09/25/2018), 0.25 mg (09/27/2018), 0.25 mg (10/28/2018), 0.25 mg (10/30/2018), 0.25 mg (11/01/2018), 0.25 mg (12/02/2018), 0.25 mg (12/04/2018), 0.25 mg (12/06/2018) azaCITIDine (VIDAZA) 100 mg in sodium chloride 0.9 % 50 mL chemo infusion, 110 mg (66.7 % of original dose 75 mg/m2), Intravenous, Once, 4 of 6 cycles Dose modification: 50 mg/m2 (original dose 75 mg/m2, Cycle 1, Reason: Provider Judgment), 50 mg/m2 (original dose 75 mg/m2, Cycle 2, Reason: Provider Judgment) Administration: 100 mg (08/22/2018), 100 mg (08/23/2018), 100 mg (08/26/2018), 100 mg (08/27/2018), 100 mg (08/28/2018), 100 mg (08/29/2018), 100 mg (08/30/2018), 100 mg (10/01/2018), 100 mg (10/02/2018), 100 mg (11/04/2018), 100 mg (11/05/2018), 100 mg (09/23/2018), 100 mg (09/24/2018), 100 mg (09/25/2018), 100 mg (09/26/2018), 100 mg (09/27/2018), 100 mg (10/28/2018), 100 mg (10/29/2018), 100 mg (10/30/2018), 100 mg (10/31/2018), 100 mg (11/01/2018), 100 mg (12/02/2018), 100 mg (12/03/2018), 100 mg (12/04/2018), 100 mg (12/05/2018), 100 mg (12/06/2018)  for chemotherapy treatment.    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  INTERVAL HISTORY:  [name], a 75 y.o. male, returns for routine follow-up and consideration for next cycle of chemotherapy. Richard Wallace was last seen on 06/28/2020.  Due for cycle #14 of decitabine today.   Today he is accompanied by his wife. Overall, he tells me he has been feeling good. He is taking Norco 2 tablets every 6 hours; he stopped taking tramadol and venetoclax. He denies having any recent infections, F/C, night sweats, or ED visits. He is not taking any blood thinners. He is taking magnesium and Aricept daily.  He is using a motorized wheelchair when he goes outside the house.  Overall, he feels ready for next cycle of chemo today.    REVIEW OF SYSTEMS:  Review of Systems  Constitutional: Positive for fatigue (75%). Negative for appetite change, chills, diaphoresis and fever.  Musculoskeletal: Positive for arthralgias (knees pain) and myalgias (thigh cramping at night).  All other systems reviewed and are negative.   PAST MEDICAL/SURGICAL HISTORY:  Past Medical History:  Diagnosis Date  . Arthritis   . 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 Cancer ([place]) 01-28-2014   skin cancer  . CKD (chronic kidney disease), stage III ([place])   . GERD (gastroesophageal reflux disease)   .  Heart murmur    NOTED DURING PHYSICAL WHEN HE WAS ENLISTING IN [facility] , DIDNT KNOW UNTIL THAT TIME AND REPORTS , "THATS THE LAST I HEARD ABOUT IT "   . History of hypertension    no longer issue  . History of kidney stones   . History of malignant melanoma of skin    excision top  of scalp 2015-- no recurrence  . History of urinary retention    post op lumbar fusion surgery 04/ 2016  . Hypertension   . 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 Left ureteral calculus   . Seasonal allergies   . Wears glasses   . Wears glasses   . Wears partial dentures    upper and lower   Past Surgical History:  Procedure Laterality Date  . ANKLE FUSION Right 2007  . CARPAL TUNNEL RELEASE Left 12/28/2009   w/ pulley release left long finger  . CARPAL TUNNEL RELEASE Right 07/22/2013   Procedure: RIGHT CARPAL TUNNEL RELEASE;  Surgeon: Cammie Sickle., MD;  Location: [facility];  Service: Orthopedics;  Laterality: Right;  . COLONOSCOPY    . CYSTO/  LEFT RETROGRADE PYELOGRAM  11/21/2010  . CYSTOSCOPY WITH STENT PLACEMENT Left 03/09/2016   Procedure: CYSTOSCOPY WITH STENT PLACEMENT;  Surgeon: Franchot Gallo, MD;  Location: [facility];  Service: Urology;  Laterality: Left;  . CYSTOSCOPY/RETROGRADE/URETEROSCOPY/STONE EXTRACTION WITH BASKET Left 03/09/2016   Procedure: CYSTOSCOPY/RETROGRADE/URETEROSCOPY/STONE EXTRACTION WITH BASKET;  Surgeon: Franchot Gallo, MD;  Location: [facility];  Service: Urology;  Laterality: Left;  . LEFT URETEROSCOPIC LASER LITHOTRIPSY STONE EXTRACTION/ STENT PLACEMENT  05/23/2010  . MOHS SURGERY     TOP OF THE HEAD   . ORIF ANKLE FRACTURE Right 1978  . PORT-A-CATH REMOVAL Right 02/14/2019   Procedure: MINOR REMOVAL PORT-A-CATH;  Surgeon: Aviva Signs, MD;  Location: [facility];  Service: General;  Laterality: Right;  . PORTACATH PLACEMENT Right 08/19/2018   Procedure: INSERTION PORT-A-CATH (attached catheter in right subclavian);  Surgeon: Aviva Signs, MD;  Location: [facility];  Service: General;  Laterality: Right;  . PORTACATH PLACEMENT Left 05/16/2019   Procedure: INSERTION PORT-A-CATH (attached catheter in left subclavian);  Surgeon: Aviva Signs, MD;  Location: [facility];  Service: General;  Laterality: Left;  . POSTERIOR LUMBAR FUSION  08/21/2014   laminectomy and decompression L2 -- L5  . RIGHT LOWER LEG SURGERY  X3  1975 to 1976   including ORIF  . TONSILLECTOMY AND ADENOIDECTOMY  1986  . UMBILICAL HERNIA REPAIR  2009 approx    SOCIAL HISTORY:  Social History   Socioeconomic History  . Marital status: Married    Spouse name: Not on file  . Number of children: Not on file  . Years of education: Not on file  . Highest education level: Not on file  Occupational History  . Not on file  Tobacco Use  . Smoking status: Former Smoker    Years: 20.00    Types: Cigarettes    Quit date: 07/17/1986    Years since quitting: 34.0  . Smokeless tobacco: Never Used  Vaping Use  . Vaping Use: Never used  Substance and Sexual Activity  . Alcohol use: Yes    Alcohol/week: 7.0 - 14.0 standard drinks    Types: 7 - 14 Cans of beer per week    Comment: 1 -2 beer daily  . Drug use: No  . Sexual  activity: Not Currently  Other Topics Concern  . Not on file  Social History Narrative  . Not on file   Social Determinants of Health   Financial Resource Strain: Low Risk   . Difficulty of Paying Living Expenses: Not hard at all  Food Insecurity: No Food Insecurity  . Worried About Charity fundraiser in the Last Year: Never true  . Ran Out of Food in the Last Year: Never true  Transportation Needs: No Transportation Needs  . Lack of Transportation (Medical): No  . Lack of Transportation (Non-Medical): No  Physical Activity: Not on file  Stress: No Stress Concern Present  . Feeling of Stress : Not at all  Social Connections: Moderately Isolated  . Frequency of Communication with Friends and Family: Twice a week  . Frequency of Social Gatherings with Friends and Family: Never  . Attends Religious Services: Never  . Active Member of Clubs or Organizations: Yes  . Attends Archivist Meetings: More than 4 times per year   . Marital Status: Married  Human resources officer Violence: Not on file    FAMILY HISTORY:  Family History  Problem Relation Age of Onset  . Stroke Mother   . Prostate cancer Father   . Bone cancer Father   . Diverticulitis Father   . Rheum arthritis Sister   . Urinary tract infection Sister   . Colon cancer Neg Hx     CURRENT MEDICATIONS:  Current Outpatient Medications  Medication Sig Dispense Refill  . allopurinol (ZYLOPRIM) 300 MG tablet Take 1 tablet (300 mg total) by mouth at bedtime. 30 tablet 3  . amLODipine (NORVASC) 2.5 MG tablet Take 1 tablet (2.5 mg total) by mouth daily. 90 tablet 2  . cetirizine (ZYRTEC) 10 MG tablet Take 10 mg by mouth daily. IN THE MORNING    . diclofenac Sodium (VOLTAREN) 1 % GEL Apply three times daily to knees as needed for pain 50 g 3  . docusate sodium (COLACE) 100 MG capsule Take 100 mg by mouth at bedtime.     . donepezil (ARICEPT) 10 MG tablet Take 1 tablet (10 mg total) by mouth at bedtime. 30 tablet 6  . HYDROcodone-acetaminophen (NORCO) 5-325 MG tablet Take 1-2 tablets by mouth every 6 (six) hours as needed for moderate pain. 240 tablet 0  . lansoprazole (PREVACID) 15 MG capsule Take 15 mg by mouth daily.     . Lidocaine 1.8 % PTCH Apply topically.    . lidocaine-prilocaine (EMLA) cream Apply 1 application topically See admin instructions. ONE HOUR PRIOR TO CHEMOTHERAPY APPOINTMENT    . magnesium oxide (MAG-OX) 400 (241.3 Mg) MG tablet Take 1 tablet (400 mg total) by mouth 3 (three) times daily. (Patient taking differently: Take 1 tablet by mouth 4 (four) times daily.) 90 tablet 3  . prochlorperazine (COMPAZINE) 10 MG tablet Take 1 tablet (10 mg total) by mouth every 6 (six) hours as needed for nausea or vomiting. 30 tablet 0  . SF 5000 PLUS 1.1 % CREA dental cream Place 1 application onto teeth at bedtime.    . tamsulosin (FLOMAX) 0.4 MG CAPS capsule Take 1 capsule (0.4 mg total) by mouth every evening. 30 capsule 2  . venetoclax (VENCLEXTA)  100 MG tablet TAKE 2 TABLETS (200 MG) BY MOUTH DAILY 60 tablet 0   No current facility-administered medications for this visit.   Facility-Administered Medications Ordered in Other Visits  Medication Dose Route Frequency Provider Last Rate Last Admin  . sodium chloride flush (  NS) 0.9 % injection 10 mL  10 mL Intracatheter PRN [name], MD   10 mL at 11/21/19 1030  . sodium chloride flush (NS) 0.9 % injection 20 mL  20 mL Intravenous PRN [name], MD   20 mL at 07/21/19 1053    ALLERGIES:  No Known Allergies  PHYSICAL EXAM:  Performance status (ECOG): 1 - Symptomatic but completely ambulatory  Vitals:   08/09/20 1030  BP: 120/88  Pulse: 81  Resp: 18  Temp: (!) 97.1 F (36.2 C)  SpO2: 96%   Wt Readings from Last 3 Encounters:  08/09/20 226 lb (102.5 kg)  05/10/20 230 lb (104.3 kg)  03/22/20 231 lb 3.2 oz (104.9 kg)   Physical Exam Vitals reviewed.  Constitutional:      Appearance: Normal appearance.     Comments: In wheelchair  Cardiovascular:     Rate and Rhythm: Normal rate and regular rhythm.     Pulses: Normal pulses.     Heart sounds: Normal heart sounds.  Pulmonary:     Effort: Pulmonary effort is normal.     Breath sounds: Normal breath sounds.  Abdominal:     Palpations: Abdomen is soft. There is no mass.     Tenderness: There is no abdominal tenderness.  Musculoskeletal:     Right lower leg: No edema.     Left lower leg: No edema.  Neurological:     General: No focal deficit present.     Mental Status: He is alert and oriented to person, place, and time.  Psychiatric:        Mood and Affect: Mood normal.        Behavior: Behavior normal.     LABORATORY DATA:  I have reviewed the labs as listed.  CBC Latest Ref Rng & Units 08/09/2020 06/28/2020 05/10/2020  WBC 4.0 - 10.5 K/uL 5.1 4.5 4.8  Hemoglobin 13.0 - 17.0 g/dL 14.4 14.4 14.5  Hematocrit 39.0 - 52.0 % 45.0 44.7 45.7  Platelets 150 - 400 K/uL 121(L) 121(L) 135(L)   CMP  Latest Ref Rng & Units 08/09/2020 06/28/2020 05/10/2020  Glucose 70 - 99 mg/dL 117(H) 92 92  BUN 8 - 23 mg/dL 20 18 25(H)  Creatinine 0.61 - 1.24 mg/dL 1.02 1.12 1.20  Sodium 135 - 145 mmol/L 142 140 141  Potassium 3.5 - 5.1 mmol/L 4.4 4.5 4.8  Chloride 98 - 111 mmol/L 107 105 106  CO2 22 - 32 mmol/L 25 29 28   Calcium 8.9 - 10.3 mg/dL 9.3 9.6 9.5  Total Protein 6.5 - 8.1 g/dL 6.3(L) 6.3(L) 6.4(L)  Total Bilirubin 0.3 - 1.2 mg/dL 0.8 0.7 0.5  Alkaline Phos 38 - 126 U/L 69 62 66  AST 15 - 41 U/L 30 20 26   ALT 0 - 44 U/L 30 23 34   Lab Results  Component Value Date   LDH 189 08/09/2020   LDH 155 06/28/2020   LDH 163 05/10/2020    DIAGNOSTIC IMAGING:  I have independently reviewed the scans and discussed with the patient. No results found.   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 -BMBX on 02/25/2019 after cycle 1 did not show any evidence of leukemia. -CT CAP on 02/12/2019 shows numerous bilateral irregular/spiculated pulmonary nodules measuring up to 14 mm, nonspecific. Hepatic steatosis. Spleen is normal. -I have talked to Dr.Ravandiat MD [place] per patient request. There are clinical trials available upon  progression of his AML. There are also some clinical trials available now based on MRD positivity. Patient not interested in moving to [place] at this time.   PLAN:  1. AML: -He denies any side effects from last cycle of treatment. -Reviewed his labs which showed normal white count 5.1 with normal differential.  Platelet count is 121 with normal hemoglobin.  LFTs are normal. -He will proceed with next cycle of decitabine today.  He will take venetoclax 200 mg daily for 14 days and stop it. -We will see him back in 6 weeks for follow-up.  2. Arthralgias: -Continue hydrocodone 5/325 2 tablets every 6 hours. -We will consider switching to 10/325 to minimize  Tylenol.  He is no longer taking tramadol.  3. Hypomagnesemia: -Continue magnesium 3 times a day.  Magnesium level 1.9 today.  4. Hypertension: -Continue Norvasc 2.5 mg daily.  5. CKD: -Creatinine is 1.02 and improved since lisinopril has been on hold.  6. Dementia: -Continue Aricept 10 mg daily.  He is tolerating well.   Orders placed this encounter:  No orders of the defined types were placed in this encounter.    [name], MD [place] [phone]   I, Milinda Antis, am acting as a scribe for Dr. Sanda Linger.  I, [name] MD, have reviewed the above documentation for accuracy and completeness, and I agree with the above.

## 2020-08-09 NOTE — Progress Notes (Signed)
Patient presents today for Decitabine infusion.  Vital signs within parameters for treatment.  Labs pending.  Patient has no new complaints since last visit.  He is still having issues with weakness in his knees.  Patient is taking Venetoclax as prescribed.  Labs reviewed and within parameters for treatment.  Messages received from Dr. Delton Coombes patient okay for treatment.  Decitabine infusion given today per MD orders.  Stable during infusion without adverse affects.  Vital signs stable.  No complaints at this time.  Discharge from clinic via wheelchair in stable condition.  Alert and oriented X 3.  Follow up with [facility] as scheduled.

## 2020-08-09 NOTE — Progress Notes (Signed)
Patient was assessed by Dr. Katragadda and labs have been reviewed. Treatment pending labs. Primary RN and pharmacy aware.   

## 2020-08-10 ENCOUNTER — Inpatient Hospital Stay (HOSPITAL_COMMUNITY): Payer: Medicare Other

## 2020-08-10 VITALS — BP 117/68 | HR 72 | Temp 97.0°F | Resp 18

## 2020-08-10 DIAGNOSIS — C9200 Acute myeloblastic leukemia, not having achieved remission: Secondary | ICD-10-CM

## 2020-08-10 DIAGNOSIS — Z5111 Encounter for antineoplastic chemotherapy: Secondary | ICD-10-CM | POA: Diagnosis not present

## 2020-08-10 DIAGNOSIS — D46Z Other myelodysplastic syndromes: Secondary | ICD-10-CM

## 2020-08-10 DIAGNOSIS — Z95828 Presence of other vascular implants and grafts: Secondary | ICD-10-CM

## 2020-08-10 MED ORDER — SODIUM CHLORIDE 0.9 % IV SOLN
Freq: Once | INTRAVENOUS | Status: AC
Start: 2020-08-10 — End: 2020-08-10

## 2020-08-10 MED ORDER — SODIUM CHLORIDE 0.9 % IV SOLN
15.0000 mg/m2 | Freq: Once | INTRAVENOUS | Status: AC
  Administered 2020-08-10: 35 mg via INTRAVENOUS
  Filled 2020-08-10: qty 7

## 2020-08-10 MED ORDER — SODIUM CHLORIDE 0.9 % IV SOLN
8.0000 mg | Freq: Once | INTRAVENOUS | Status: AC
  Administered 2020-08-10: 8 mg via INTRAVENOUS
  Filled 2020-08-10: qty 4

## 2020-08-10 MED ORDER — SODIUM CHLORIDE 0.9% FLUSH
10.0000 mL | INTRAVENOUS | Status: DC | PRN
  Administered 2020-08-10: 10 mL

## 2020-08-10 MED ORDER — HEPARIN SOD (PORK) LOCK FLUSH 100 UNIT/ML IV SOLN
500.0000 [IU] | Freq: Once | INTRAVENOUS | Status: AC | PRN
  Administered 2020-08-10: 500 [IU]

## 2020-08-10 NOTE — Patient Instructions (Signed)
Decitabine injection for infusion What is this medicine? DECITABINE (dee SYE ta been) is a chemotherapy drug. This medicine reduces the growth of cancer cells. It is used to treat adults with myelodysplastic syndromes. This medicine may be used for other purposes; ask your health care provider or pharmacist if you have questions. COMMON BRAND NAME(S): Dacogen What should I tell my health care provider before I take this medicine? They need to know if you have any of these conditions:  infection (especially a virus infection such as chickenpox, cold sores, or herpes)  kidney disease  liver disease  an unusual or allergic reaction to decitabine, other medicines, foods, dyes, or preservatives  pregnant or trying to get pregnant  breast-feeding How should I use this medicine? This medicine is for infusion into a vein. It is administered in a hospital or clinic by a doctor or health care professional. Talk to your pediatrician regarding the use of this medicine in children. Special care may be needed. Overdosage: If you think you have taken too much of this medicine contact a poison control center or emergency room at once. NOTE: This medicine is only for you. Do not share this medicine with others. What if I miss a dose? It is important not to miss your dose. Call your doctor or health care professional if you are unable to keep an appointment. What may interact with this medicine?  vaccines Talk to your doctor or health care professional before taking any of these medicines:  aspirin  acetaminophen  ibuprofen  ketoprofen  naproxen This list may not describe all possible interactions. Give your health care provider a list of all the medicines, herbs, non-prescription drugs, or dietary supplements you use. Also tell them if you smoke, drink alcohol, or use illegal drugs. Some items may interact with your medicine. What should I watch for while using this medicine? Visit your  doctor for checks on your progress. This drug may make you feel generally unwell. This is not uncommon, as chemotherapy can affect healthy cells as well as cancer cells. Report any side effects. Continue your course of treatment even though you feel ill unless your doctor tells you to stop. You may need blood work done while you are taking this medicine. In some cases, you may be given additional medicines to help with side effects. Follow all directions for their use. Call your doctor or health care professional for advice if you get a fever, chills or sore throat, or other symptoms of a cold or flu. Do not treat yourself. This drug decreases your body's ability to fight infections. Try to avoid being around people who are sick. This medicine may increase your risk to bruise or bleed. Call your doctor or health care professional if you notice any unusual bleeding. Do not become pregnant while taking this medicine or for 6 months after stopping it. Women should inform their doctor if they wish to become pregnant or think they might be pregnant. Men should not father a child while taking this medicine and for 3 months after stopping it. There is a potential for serious side effects to an unborn child. Talk to your health care professional or pharmacist for more information. Do not breast-feed an infant while taking this medicine or for at least 2 weeks after stopping it. In males, this medicine may interfere with the ability to father a child. Talk with your doctor or health care professional if you are concerned about your fertility. What side effects may I  notice from receiving this medicine? Side effects that you should report to your doctor or health care professional as soon as possible:  low blood counts - this medicine may decrease the number of white blood cells, red blood cells and platelets. You may be at increased risk for infections and bleeding.  signs of infection - fever or chills, cough,  sore throat, pain or difficulty passing urine  signs of decreased platelets or bleeding - bruising, pinpoint red spots on the skin, black, tarry stools, blood in the urine  signs of decreased red blood cells - unusual weakness or tiredness, fainting spells, lightheadedness  increased blood sugar Side effects that usually do not require medical attention (report to your doctor or health care professional if they continue or are bothersome):  constipation  diarrhea  headache  loss of appetite  nausea, vomiting  skin rash, itching  stomach pain  water retention  weak or tired This list may not describe all possible side effects. Call your doctor for medical advice about side effects. You may report side effects to FDA at 1-800-FDA-1088. Where should I keep my medicine? This drug is given in a hospital or clinic and will not be stored at home. NOTE: This sheet is a summary. It may not cover all possible information. If you have questions about this medicine, talk to your doctor, pharmacist, or health care provider.  2021 [employer] Standard (2018-07-05 13:32:17)

## 2020-08-10 NOTE — Progress Notes (Signed)
Patient presents today for Decitabine Day 2.  Vital signs within parameters for treatment.  Patient has no new complaints since last visit.  Decitabine infusion given today per MD orders. Stable during infusion without adverse affects.  Vital signs stable.  No complaints at this time.  Discharge from clinic via wheelchair in stable condition.  Alert and oriented X 3.  Follow up with [facility] as scheduled.

## 2020-08-11 ENCOUNTER — Other Ambulatory Visit: Payer: Self-pay

## 2020-08-11 ENCOUNTER — Inpatient Hospital Stay (HOSPITAL_COMMUNITY): Payer: Medicare Other

## 2020-08-11 VITALS — BP 128/69 | HR 67 | Temp 98.0°F | Resp 18

## 2020-08-11 DIAGNOSIS — Z5111 Encounter for antineoplastic chemotherapy: Secondary | ICD-10-CM | POA: Diagnosis not present

## 2020-08-11 DIAGNOSIS — C9200 Acute myeloblastic leukemia, not having achieved remission: Secondary | ICD-10-CM

## 2020-08-11 DIAGNOSIS — Z95828 Presence of other vascular implants and grafts: Secondary | ICD-10-CM

## 2020-08-11 DIAGNOSIS — D46Z Other myelodysplastic syndromes: Secondary | ICD-10-CM

## 2020-08-11 MED ORDER — SODIUM CHLORIDE 0.9 % IV SOLN: Freq: Once | INTRAVENOUS | Status: AC

## 2020-08-11 MED ORDER — HEPARIN SOD (PORK) LOCK FLUSH 100 UNIT/ML IV SOLN
500.0000 [IU] | Freq: Once | INTRAVENOUS | Status: AC | PRN
Start: 2020-08-11 — End: 2020-08-11
  Administered 2020-08-11: 500 [IU]

## 2020-08-11 MED ORDER — SODIUM CHLORIDE 0.9 % IV SOLN
15.0000 mg/m2 | Freq: Once | INTRAVENOUS | Status: AC
  Administered 2020-08-11: 35 mg via INTRAVENOUS
  Filled 2020-08-11: qty 7

## 2020-08-11 MED ORDER — ONDANSETRON HCL 40 MG/20ML IJ SOLN
8.0000 mg | Freq: Once | INTRAMUSCULAR | Status: AC
Start: 2020-08-11 — End: 2020-08-11
  Administered 2020-08-11: 8 mg via INTRAVENOUS
  Filled 2020-08-11: qty 4

## 2020-08-11 MED ORDER — SODIUM CHLORIDE 0.9% FLUSH
10.0000 mL | INTRAVENOUS | Status: DC | PRN
  Administered 2020-08-11: 10 mL

## 2020-08-11 NOTE — Progress Notes (Signed)
Patient presents today for Day 3 Decitabine infusion.  Vital signs within parameters for treatment.  Patient has no new complaints since last visit.  Decitabine given today per MD orders.  Stable during infusion without adverse affects.  Vital signs stable.  No complaints at this time.  Discharge from clinic via wheelchair in stable condition.  Alert and oriented X 3.  Follow up with [facility] as scheduled.

## 2020-08-11 NOTE — Patient Instructions (Signed)
Decitabine injection for infusion What is this medicine? DECITABINE (dee SYE ta been) is a chemotherapy drug. This medicine reduces the growth of cancer cells. It is used to treat adults with myelodysplastic syndromes. This medicine may be used for other purposes; ask your health care provider or pharmacist if you have questions. COMMON BRAND NAME(S): Dacogen What should I tell my health care provider before I take this medicine? They need to know if you have any of these conditions:  infection (especially a virus infection such as chickenpox, cold sores, or herpes)  kidney disease  liver disease  an unusual or allergic reaction to decitabine, other medicines, foods, dyes, or preservatives  pregnant or trying to get pregnant  breast-feeding How should I use this medicine? This medicine is for infusion into a vein. It is administered in a hospital or clinic by a doctor or health care professional. Talk to your pediatrician regarding the use of this medicine in children. Special care may be needed. Overdosage: If you think you have taken too much of this medicine contact a poison control center or emergency room at once. NOTE: This medicine is only for you. Do not share this medicine with others. What if I miss a dose? It is important not to miss your dose. Call your doctor or health care professional if you are unable to keep an appointment. What may interact with this medicine?  vaccines Talk to your doctor or health care professional before taking any of these medicines:  aspirin  acetaminophen  ibuprofen  ketoprofen  naproxen This list may not describe all possible interactions. Give your health care provider a list of all the medicines, herbs, non-prescription drugs, or dietary supplements you use. Also tell them if you smoke, drink alcohol, or use illegal drugs. Some items may interact with your medicine. What should I watch for while using this medicine? Visit your  doctor for checks on your progress. This drug may make you feel generally unwell. This is not uncommon, as chemotherapy can affect healthy cells as well as cancer cells. Report any side effects. Continue your course of treatment even though you feel ill unless your doctor tells you to stop. You may need blood work done while you are taking this medicine. In some cases, you may be given additional medicines to help with side effects. Follow all directions for their use. Call your doctor or health care professional for advice if you get a fever, chills or sore throat, or other symptoms of a cold or flu. Do not treat yourself. This drug decreases your body's ability to fight infections. Try to avoid being around people who are sick. This medicine may increase your risk to bruise or bleed. Call your doctor or health care professional if you notice any unusual bleeding. Do not become pregnant while taking this medicine or for 6 months after stopping it. Women should inform their doctor if they wish to become pregnant or think they might be pregnant. Men should not father a child while taking this medicine and for 3 months after stopping it. There is a potential for serious side effects to an unborn child. Talk to your health care professional or pharmacist for more information. Do not breast-feed an infant while taking this medicine or for at least 2 weeks after stopping it. In males, this medicine may interfere with the ability to father a child. Talk with your doctor or health care professional if you are concerned about your fertility. What side effects may I  notice from receiving this medicine? Side effects that you should report to your doctor or health care professional as soon as possible:  low blood counts - this medicine may decrease the number of white blood cells, red blood cells and platelets. You may be at increased risk for infections and bleeding.  signs of infection - fever or chills, cough,  sore throat, pain or difficulty passing urine  signs of decreased platelets or bleeding - bruising, pinpoint red spots on the skin, black, tarry stools, blood in the urine  signs of decreased red blood cells - unusual weakness or tiredness, fainting spells, lightheadedness  increased blood sugar Side effects that usually do not require medical attention (report to your doctor or health care professional if they continue or are bothersome):  constipation  diarrhea  headache  loss of appetite  nausea, vomiting  skin rash, itching  stomach pain  water retention  weak or tired This list may not describe all possible side effects. Call your doctor for medical advice about side effects. You may report side effects to FDA at 1-800-FDA-1088. Where should I keep my medicine? This drug is given in a hospital or clinic and will not be stored at home. NOTE: This sheet is a summary. It may not cover all possible information. If you have questions about this medicine, talk to your doctor, pharmacist, or health care provider.  2021 [employer] Standard (2018-07-05 13:32:17)

## 2020-08-12 ENCOUNTER — Inpatient Hospital Stay (HOSPITAL_COMMUNITY): Payer: Medicare Other

## 2020-08-12 ENCOUNTER — Other Ambulatory Visit (HOSPITAL_COMMUNITY): Payer: Self-pay

## 2020-08-12 VITALS — BP 140/85 | HR 67 | Temp 96.8°F | Resp 18

## 2020-08-12 DIAGNOSIS — D46Z Other myelodysplastic syndromes: Secondary | ICD-10-CM

## 2020-08-12 DIAGNOSIS — C9200 Acute myeloblastic leukemia, not having achieved remission: Secondary | ICD-10-CM

## 2020-08-12 DIAGNOSIS — Z95828 Presence of other vascular implants and grafts: Secondary | ICD-10-CM

## 2020-08-12 DIAGNOSIS — Z5111 Encounter for antineoplastic chemotherapy: Secondary | ICD-10-CM | POA: Diagnosis not present

## 2020-08-12 MED ORDER — SODIUM CHLORIDE 0.9 % IV SOLN: Freq: Once | INTRAVENOUS | Status: AC

## 2020-08-12 MED ORDER — ONDANSETRON HCL 40 MG/20ML IJ SOLN
8.0000 mg | Freq: Once | INTRAMUSCULAR | Status: AC
Start: 2020-08-12 — End: 2020-08-12
  Administered 2020-08-12: 8 mg via INTRAVENOUS
  Filled 2020-08-12: qty 4

## 2020-08-12 MED ORDER — SODIUM CHLORIDE 0.9% FLUSH
10.0000 mL | INTRAVENOUS | Status: DC | PRN
  Administered 2020-08-12: 10 mL

## 2020-08-12 MED ORDER — HEPARIN SOD (PORK) LOCK FLUSH 100 UNIT/ML IV SOLN
500.0000 [IU] | Freq: Once | INTRAVENOUS | Status: AC | PRN
  Administered 2020-08-12: 500 [IU]

## 2020-08-12 MED ORDER — SODIUM CHLORIDE 0.9 % IV SOLN
15.0000 mg/m2 | Freq: Once | INTRAVENOUS | Status: AC
  Administered 2020-08-12: 35 mg via INTRAVENOUS
  Filled 2020-08-12: qty 7

## 2020-08-12 NOTE — Patient Instructions (Signed)
Decitabine injection for infusion What is this medicine? DECITABINE (dee SYE ta been) is a chemotherapy drug. This medicine reduces the growth of cancer cells. It is used to treat adults with myelodysplastic syndromes. This medicine may be used for other purposes; ask your health care provider or pharmacist if you have questions. COMMON BRAND NAME(S): Dacogen What should I tell my health care provider before I take this medicine? They need to know if you have any of these conditions:  infection (especially a virus infection such as chickenpox, cold sores, or herpes)  kidney disease  liver disease  an unusual or allergic reaction to decitabine, other medicines, foods, dyes, or preservatives  pregnant or trying to get pregnant  breast-feeding How should I use this medicine? This medicine is for infusion into a vein. It is administered in a hospital or clinic by a doctor or health care professional. Talk to your pediatrician regarding the use of this medicine in children. Special care may be needed. Overdosage: If you think you have taken too much of this medicine contact a poison control center or emergency room at once. NOTE: This medicine is only for you. Do not share this medicine with others. What if I miss a dose? It is important not to miss your dose. Call your doctor or health care professional if you are unable to keep an appointment. What may interact with this medicine?  vaccines Talk to your doctor or health care professional before taking any of these medicines:  aspirin  acetaminophen  ibuprofen  ketoprofen  naproxen This list may not describe all possible interactions. Give your health care provider a list of all the medicines, herbs, non-prescription drugs, or dietary supplements you use. Also tell them if you smoke, drink alcohol, or use illegal drugs. Some items may interact with your medicine. What should I watch for while using this medicine? Visit your  doctor for checks on your progress. This drug may make you feel generally unwell. This is not uncommon, as chemotherapy can affect healthy cells as well as cancer cells. Report any side effects. Continue your course of treatment even though you feel ill unless your doctor tells you to stop. You may need blood work done while you are taking this medicine. In some cases, you may be given additional medicines to help with side effects. Follow all directions for their use. Call your doctor or health care professional for advice if you get a fever, chills or sore throat, or other symptoms of a cold or flu. Do not treat yourself. This drug decreases your body's ability to fight infections. Try to avoid being around people who are sick. This medicine may increase your risk to bruise or bleed. Call your doctor or health care professional if you notice any unusual bleeding. Do not become pregnant while taking this medicine or for 6 months after stopping it. Women should inform their doctor if they wish to become pregnant or think they might be pregnant. Men should not father a child while taking this medicine and for 3 months after stopping it. There is a potential for serious side effects to an unborn child. Talk to your health care professional or pharmacist for more information. Do not breast-feed an infant while taking this medicine or for at least 2 weeks after stopping it. In males, this medicine may interfere with the ability to father a child. Talk with your doctor or health care professional if you are concerned about your fertility. What side effects may I  notice from receiving this medicine? Side effects that you should report to your doctor or health care professional as soon as possible:  low blood counts - this medicine may decrease the number of white blood cells, red blood cells and platelets. You may be at increased risk for infections and bleeding.  signs of infection - fever or chills, cough,  sore throat, pain or difficulty passing urine  signs of decreased platelets or bleeding - bruising, pinpoint red spots on the skin, black, tarry stools, blood in the urine  signs of decreased red blood cells - unusual weakness or tiredness, fainting spells, lightheadedness  increased blood sugar Side effects that usually do not require medical attention (report to your doctor or health care professional if they continue or are bothersome):  constipation  diarrhea  headache  loss of appetite  nausea, vomiting  skin rash, itching  stomach pain  water retention  weak or tired This list may not describe all possible side effects. Call your doctor for medical advice about side effects. You may report side effects to FDA at 1-800-FDA-1088. Where should I keep my medicine? This drug is given in a hospital or clinic and will not be stored at home. NOTE: This sheet is a summary. It may not cover all possible information. If you have questions about this medicine, talk to your doctor, pharmacist, or health care provider.  2021 [employer] Standard (2018-07-05 13:32:17)

## 2020-08-12 NOTE — Progress Notes (Signed)
Patient presents today for day 4 Decitabine infusion.  Vital signs within parameters for treatment.  Patient has no new complaints since last treatment.  Decitabine infusion given today per MD orders.  Stable during infusion without adverse affects.  Vital signs stable.  No complaints at this time.  Discharge from clinic via wheelchair in stable condition.  Alert and oriented X 3.  Follow up with [facility] as scheduled.

## 2020-08-13 ENCOUNTER — Other Ambulatory Visit: Payer: Self-pay

## 2020-08-13 ENCOUNTER — Inpatient Hospital Stay (HOSPITAL_COMMUNITY): Payer: Medicare Other

## 2020-08-13 VITALS — BP 135/68 | HR 64 | Temp 96.9°F | Resp 18

## 2020-08-13 DIAGNOSIS — Z5111 Encounter for antineoplastic chemotherapy: Secondary | ICD-10-CM | POA: Diagnosis not present

## 2020-08-13 DIAGNOSIS — D46Z Other myelodysplastic syndromes: Secondary | ICD-10-CM

## 2020-08-13 DIAGNOSIS — C9200 Acute myeloblastic leukemia, not having achieved remission: Secondary | ICD-10-CM

## 2020-08-13 DIAGNOSIS — Z95828 Presence of other vascular implants and grafts: Secondary | ICD-10-CM

## 2020-08-13 MED ORDER — SODIUM CHLORIDE 0.9 % IV SOLN
8.0000 mg | Freq: Once | INTRAVENOUS | Status: AC
  Administered 2020-08-13: 8 mg via INTRAVENOUS
  Filled 2020-08-13: qty 4

## 2020-08-13 MED ORDER — SODIUM CHLORIDE 0.9 % IV SOLN: Freq: Once | INTRAVENOUS | Status: AC

## 2020-08-13 MED ORDER — SODIUM CHLORIDE 0.9 % IV SOLN
15.0000 mg/m2 | Freq: Once | INTRAVENOUS | Status: AC
  Administered 2020-08-13: 35 mg via INTRAVENOUS
  Filled 2020-08-13: qty 7

## 2020-08-13 MED ORDER — HEPARIN SOD (PORK) LOCK FLUSH 100 UNIT/ML IV SOLN
500.0000 [IU] | Freq: Once | INTRAVENOUS | Status: AC | PRN
  Administered 2020-08-13: 500 [IU]

## 2020-08-13 MED ORDER — SODIUM CHLORIDE 0.9% FLUSH
10.0000 mL | INTRAVENOUS | Status: DC | PRN
  Administered 2020-08-13: 10 mL

## 2020-08-13 NOTE — Patient Instructions (Signed)
Decitabine injection for infusion What is this medicine? DECITABINE (dee SYE ta been) is a chemotherapy drug. This medicine reduces the growth of cancer cells. It is used to treat adults with myelodysplastic syndromes. This medicine may be used for other purposes; ask your health care provider or pharmacist if you have questions. COMMON BRAND NAME(S): Dacogen What should I tell my health care provider before I take this medicine? They need to know if you have any of these conditions:  infection (especially a virus infection such as chickenpox, cold sores, or herpes)  kidney disease  liver disease  an unusual or allergic reaction to decitabine, other medicines, foods, dyes, or preservatives  pregnant or trying to get pregnant  breast-feeding How should I use this medicine? This medicine is for infusion into a vein. It is administered in a hospital or clinic by a doctor or health care professional. Talk to your pediatrician regarding the use of this medicine in children. Special care may be needed. Overdosage: If you think you have taken too much of this medicine contact a poison control center or emergency room at once. NOTE: This medicine is only for you. Do not share this medicine with others. What if I miss a dose? It is important not to miss your dose. Call your doctor or health care professional if you are unable to keep an appointment. What may interact with this medicine?  vaccines Talk to your doctor or health care professional before taking any of these medicines:  aspirin  acetaminophen  ibuprofen  ketoprofen  naproxen This list may not describe all possible interactions. Give your health care provider a list of all the medicines, herbs, non-prescription drugs, or dietary supplements you use. Also tell them if you smoke, drink alcohol, or use illegal drugs. Some items may interact with your medicine. What should I watch for while using this medicine? Visit your  doctor for checks on your progress. This drug may make you feel generally unwell. This is not uncommon, as chemotherapy can affect healthy cells as well as cancer cells. Report any side effects. Continue your course of treatment even though you feel ill unless your doctor tells you to stop. You may need blood work done while you are taking this medicine. In some cases, you may be given additional medicines to help with side effects. Follow all directions for their use. Call your doctor or health care professional for advice if you get a fever, chills or sore throat, or other symptoms of a cold or flu. Do not treat yourself. This drug decreases your body's ability to fight infections. Try to avoid being around people who are sick. This medicine may increase your risk to bruise or bleed. Call your doctor or health care professional if you notice any unusual bleeding. Do not become pregnant while taking this medicine or for 6 months after stopping it. Women should inform their doctor if they wish to become pregnant or think they might be pregnant. Men should not father a child while taking this medicine and for 3 months after stopping it. There is a potential for serious side effects to an unborn child. Talk to your health care professional or pharmacist for more information. Do not breast-feed an infant while taking this medicine or for at least 2 weeks after stopping it. In males, this medicine may interfere with the ability to father a child. Talk with your doctor or health care professional if you are concerned about your fertility. What side effects may I  notice from receiving this medicine? Side effects that you should report to your doctor or health care professional as soon as possible:  low blood counts - this medicine may decrease the number of white blood cells, red blood cells and platelets. You may be at increased risk for infections and bleeding.  signs of infection - fever or chills, cough,  sore throat, pain or difficulty passing urine  signs of decreased platelets or bleeding - bruising, pinpoint red spots on the skin, black, tarry stools, blood in the urine  signs of decreased red blood cells - unusual weakness or tiredness, fainting spells, lightheadedness  increased blood sugar Side effects that usually do not require medical attention (report to your doctor or health care professional if they continue or are bothersome):  constipation  diarrhea  headache  loss of appetite  nausea, vomiting  skin rash, itching  stomach pain  water retention  weak or tired This list may not describe all possible side effects. Call your doctor for medical advice about side effects. You may report side effects to FDA at 1-800-FDA-1088. Where should I keep my medicine? This drug is given in a hospital or clinic and will not be stored at home. NOTE: This sheet is a summary. It may not cover all possible information. If you have questions about this medicine, talk to your doctor, pharmacist, or health care provider.  2021 [employer] Standard (2018-07-05 13:32:17)

## 2020-08-13 NOTE — Progress Notes (Signed)
Patient presents today for day 5 Decitabine infusion.  Vital signs within parameters for treatment.  Patient has no new complaints at this time.  Decitabine given today per MD orders.  Stable during infusion without adverse affects.  Vital signs stable.  No complaints at this time.  Discharge from clinic via wheelchair in stable condition.  Alert and oriented X 3.  Follow up with [facility] as scheduled.

## 2020-08-30 ENCOUNTER — Other Ambulatory Visit (HOSPITAL_COMMUNITY): Payer: Self-pay | Admitting: *Deleted

## 2020-08-30 DIAGNOSIS — C9200 Acute myeloblastic leukemia, not having achieved remission: Secondary | ICD-10-CM

## 2020-08-30 DIAGNOSIS — D46Z Other myelodysplastic syndromes: Secondary | ICD-10-CM

## 2020-08-30 MED ORDER — HYDROCODONE-ACETAMINOPHEN 5-325 MG PO TABS: 1.0000 | ORAL_TABLET | Freq: Four times a day (QID) | ORAL | 0 refills | Status: DC | PRN

## 2020-09-07 ENCOUNTER — Other Ambulatory Visit (HOSPITAL_COMMUNITY): Payer: Self-pay

## 2020-09-07 ENCOUNTER — Other Ambulatory Visit (HOSPITAL_COMMUNITY): Payer: Self-pay | Admitting: Hematology

## 2020-09-07 DIAGNOSIS — C9200 Acute myeloblastic leukemia, not having achieved remission: Secondary | ICD-10-CM

## 2020-09-07 MED ORDER — VENETOCLAX 100 MG PO TABS
ORAL_TABLET | ORAL | 0 refills | Status: DC
  Filled 2020-09-07: qty 60, 30d supply, fill #0

## 2020-09-08 ENCOUNTER — Other Ambulatory Visit (HOSPITAL_COMMUNITY): Payer: Self-pay

## 2020-09-09 ENCOUNTER — Other Ambulatory Visit (HOSPITAL_COMMUNITY): Payer: Self-pay

## 2020-09-18 NOTE — Progress Notes (Signed)
Richard Wallace, [place] [address]   CLINIC:  Medical Oncology/Hematology  PCP:  [name], MD [address] [place] [address] [phone]   REASON FOR VISIT:  Follow-up for AML  PRIOR THERAPY: none  NGS Results: not done  CURRENT THERAPY: Decitabine every 6 weeks & venetoclax 200 mg x2 weeks after chemo  BRIEF ONCOLOGIC HISTORY:  Oncology History  MDS (myelodysplastic syndrome), high grade ([facility])  08/14/2018 Initial Diagnosis   MDS (myelodysplastic syndrome), high grade ([facility])   08/22/2018 - 12/06/2018 Chemotherapy   The patient had palonosetron (ALOXI) injection 0.25 mg, 0.25 mg, Intravenous,  Once, 4 of 6 cycles Administration: 0.25 mg (08/22/2018), 0.25 mg (08/26/2018), 0.25 mg (08/28/2018), 0.25 mg (08/30/2018), 0.25 mg (10/01/2018), 0.25 mg (10/02/2018), 0.25 mg (11/04/2018), 0.25 mg (09/23/2018), 0.25 mg (09/25/2018), 0.25 mg (09/27/2018), 0.25 mg (10/28/2018), 0.25 mg (10/30/2018), 0.25 mg (11/01/2018), 0.25 mg (12/02/2018), 0.25 mg (12/04/2018), 0.25 mg (12/06/2018) azaCITIDine (VIDAZA) 100 mg in sodium chloride 0.9 % 50 mL chemo infusion, 110 mg (66.7 % of original dose 75 mg/m2), Intravenous, Once, 4 of 6 cycles Dose modification: 50 mg/m2 (original dose 75 mg/m2, Cycle 1, Reason: Provider Judgment), 50 mg/m2 (original dose 75 mg/m2, Cycle 2, Reason: Provider Judgment) Administration: 100 mg (08/22/2018), 100 mg (08/23/2018), 100 mg (08/26/2018), 100 mg (08/27/2018), 100 mg (08/28/2018), 100 mg (08/29/2018), 100 mg (08/30/2018), 100 mg (10/01/2018), 100 mg (10/02/2018), 100 mg (11/04/2018), 100 mg (11/05/2018), 100 mg (09/23/2018), 100 mg (09/24/2018), 100 mg (09/25/2018), 100 mg (09/26/2018), 100 mg (09/27/2018), 100 mg (10/28/2018), 100 mg (10/29/2018), 100 mg (10/30/2018), 100 mg (10/31/2018), 100 mg (11/01/2018), 100 mg (12/02/2018), 100 mg (12/03/2018), 100 mg (12/04/2018), 100 mg (12/05/2018), 100 mg (12/06/2018)  for chemotherapy treatment.    01/20/2019 -   Chemotherapy    Patient is on Treatment Plan: MYELODYSPLASIA DECITABINE D1-5 Q42D      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[facility])  01/07/2019 Initial Diagnosis   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[facility])   01/20/2019 -  Chemotherapy    Patient is on Treatment Plan: MYELODYSPLASIA DECITABINE D1-5 Q42D        CANCER STAGING: Cancer Staging No matching staging information was found for the patient.  INTERVAL HISTORY:  [name], a 75 y.o. male, returns for routine follow-up and consideration for next cycle of chemotherapy. Richard Wallace was last seen on 08/09/2020.  Due for cycle #15 of Decitabine today.   Overall, he tells me he has been feeling pretty well. He reports soreness at site of melanoma removal. He complains of constipation but it is controlled with a stool softener. His leg swelling is present but stable. He reports improved but still low energy levels. Knee pain has improved, but it still impedes some activity. He is taking NORCO twice daily. He denies nausea with treatment.  Overall, he feels ready for next cycle of chemo today.    REVIEW OF SYSTEMS:  Review of Systems  Constitutional: Positive for appetite change (90%) and fatigue (50%).  Cardiovascular: Positive for leg swelling (no change).  Gastrointestinal: Positive for constipation (controlled w/ stool softener). Negative for nausea.  Neurological: Positive for numbness.  All other systems reviewed and are negative.   PAST MEDICAL/SURGICAL HISTORY:  Past Medical History:  Diagnosis Date  . Arthritis   . 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 Cancer ([facility]) 01-28-2014   skin cancer  . CKD (chronic kidney disease), stage III ([facility])   . GERD (gastroesophageal reflux  disease)   . Heart murmur    NOTED DURING PHYSICAL WHEN HE WAS ENLISTING IN [place] , DIDNT KNOW UNTIL THAT TIME AND REPORTS , "THATS THE LAST I HEARD ABOUT IT "   . History of hypertension    no longer issue  . History of kidney stones    . History of malignant melanoma of skin    excision top of scalp 2015-- no recurrence  . History of urinary retention    post op lumbar fusion surgery 04/ 2016  . Hypertension   . 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 Left ureteral calculus   . Seasonal allergies   . Wears glasses   . Wears glasses   . Wears partial dentures    upper and lower   Past Surgical History:  Procedure Laterality Date  . ANKLE FUSION Right 2007  . CARPAL TUNNEL RELEASE Left 12/28/2009   w/ pulley release left long finger  . CARPAL TUNNEL RELEASE Right 07/22/2013   Procedure: RIGHT CARPAL TUNNEL RELEASE;  Surgeon: Cammie Sickle., MD;  Location: [facility];  Service: Orthopedics;  Laterality: Right;  . COLONOSCOPY    . CYSTO/  LEFT RETROGRADE PYELOGRAM  11/21/2010  . CYSTOSCOPY WITH STENT PLACEMENT Left 03/09/2016   Procedure: CYSTOSCOPY WITH STENT PLACEMENT;  Surgeon: Franchot Gallo, MD;  Location: [facility];  Service: Urology;  Laterality: Left;  . CYSTOSCOPY/RETROGRADE/URETEROSCOPY/STONE EXTRACTION WITH BASKET Left 03/09/2016   Procedure: CYSTOSCOPY/RETROGRADE/URETEROSCOPY/STONE EXTRACTION WITH BASKET;  Surgeon: Franchot Gallo, MD;  Location: [facility];  Service: Urology;  Laterality: Left;  . LEFT URETEROSCOPIC LASER LITHOTRIPSY STONE EXTRACTION/ STENT PLACEMENT  05/23/2010  . MOHS SURGERY     TOP OF THE HEAD   . ORIF ANKLE FRACTURE Right 1978  . PORT-A-CATH REMOVAL Right 02/14/2019   Procedure: MINOR REMOVAL PORT-A-CATH;  Surgeon: Aviva Signs, MD;  Location: [facility];  Service: General;  Laterality: Right;  . PORTACATH PLACEMENT Right 08/19/2018   Procedure: INSERTION PORT-A-CATH (attached catheter in right subclavian);  Surgeon: Aviva Signs, MD;  Location: [facility];  Service: General;  Laterality: Right;  . PORTACATH PLACEMENT Left 05/16/2019   Procedure: INSERTION PORT-A-CATH  (attached catheter in left subclavian);  Surgeon: Aviva Signs, MD;  Location: [facility];  Service: General;  Laterality: Left;  . POSTERIOR LUMBAR FUSION  08/21/2014   laminectomy and decompression L2 -- L5  . RIGHT LOWER LEG SURGERY  X3  1975 to 1976   including ORIF  . TONSILLECTOMY AND ADENOIDECTOMY  1986  . UMBILICAL HERNIA REPAIR  2009 approx    SOCIAL HISTORY:  Social History   Socioeconomic History  . Marital status: Married    Spouse name: Not on file  . Number of children: Not on file  . Years of education: Not on file  . Highest education level: Not on file  Occupational History  . Not on file  Tobacco Use  . Smoking status: Former Smoker    Years: 20.00    Types: Cigarettes    Quit date: 07/17/1986    Years since quitting: 34.1  . Smokeless tobacco: Never Used  Vaping Use  . Vaping Use: Never used  Substance and Sexual Activity  . Alcohol use: Yes    Alcohol/week: 7.0 - 14.0 standard drinks    Types: 7 - 14 Cans of beer per week    Comment: 1 -2 beer daily  . Drug use:  No  . Sexual activity: Not Currently  Other Topics Concern  . Not on file  Social History Narrative  . Not on file   Social Determinants of Health   Financial Resource Strain: Low Risk   . Difficulty of Paying Living Expenses: Not hard at all  Food Insecurity: No Food Insecurity  . Worried About Charity fundraiser in the Last Year: Never true  . Ran Out of Food in the Last Year: Never true  Transportation Needs: No Transportation Needs  . Lack of Transportation (Medical): No  . Lack of Transportation (Non-Medical): No  Physical Activity: Not on file  Stress: No Stress Concern Present  . Feeling of Stress : Not at all  Social Connections: Moderately Isolated  . Frequency of Communication with Friends and Family: Twice a week  . Frequency of Social Gatherings with Friends and Family: Never  . Attends Religious Services: Never  . Active Member of Clubs or Organizations: Yes  . Attends  Archivist Meetings: More than 4 times per year  . Marital Status: Married  Human resources officer Violence: Not on file    FAMILY HISTORY:  Family History  Problem Relation Age of Onset  . Stroke Mother   . Prostate cancer Father   . Bone cancer Father   . Diverticulitis Father   . Rheum arthritis Sister   . Urinary tract infection Sister   . Colon cancer Neg Hx     CURRENT MEDICATIONS:  Current Outpatient Medications  Medication Sig Dispense Refill  . allopurinol (ZYLOPRIM) 300 MG tablet Take 1 tablet (300 mg total) by mouth at bedtime. 30 tablet 3  . amLODipine (NORVASC) 2.5 MG tablet Take 1 tablet (2.5 mg total) by mouth daily. 90 tablet 2  . cetirizine (ZYRTEC) 10 MG tablet Take 10 mg by mouth daily. IN THE MORNING    . diclofenac Sodium (VOLTAREN) 1 % GEL Apply three times daily to knees as needed for pain 50 g 3  . docusate sodium (COLACE) 100 MG capsule Take 100 mg by mouth at bedtime.     . donepezil (ARICEPT) 10 MG tablet Take 1 tablet (10 mg total) by mouth at bedtime. 30 tablet 6  . HYDROcodone-acetaminophen (NORCO) 5-325 MG tablet Take 1-2 tablets by mouth every 6 (six) hours as needed for moderate pain. 240 tablet 0  . lansoprazole (PREVACID) 15 MG capsule Take 15 mg by mouth daily.     . Lidocaine 1.8 % PTCH Apply topically.    . lidocaine-prilocaine (EMLA) cream Apply 1 application topically See admin instructions. ONE HOUR PRIOR TO CHEMOTHERAPY APPOINTMENT    . magnesium oxide (MAG-OX) 400 (241.3 Mg) MG tablet Take 1 tablet (400 mg total) by mouth 3 (three) times daily. (Patient taking differently: Take 1 tablet by mouth 4 (four) times daily.) 90 tablet 3  . prochlorperazine (COMPAZINE) 10 MG tablet Take 1 tablet (10 mg total) by mouth every 6 (six) hours as needed for nausea or vomiting. 30 tablet 0  . SF 5000 PLUS 1.1 % CREA dental cream Place 1 application onto teeth at bedtime.    . tamsulosin (FLOMAX) 0.4 MG CAPS capsule Take 1 capsule (0.4 mg total) by  mouth every evening. 30 capsule 2  . venetoclax (VENCLEXTA) 100 MG tablet TAKE 2 TABLETS (200 MG) BY MOUTH DAILY 60 tablet 0   No current facility-administered medications for this visit.   Facility-Administered Medications Ordered in Other Visits  Medication Dose Route Frequency Provider Last Rate Last Admin  .  sodium chloride flush (NS) 0.9 % injection 10 mL  10 mL Intracatheter PRN [name], MD   10 mL at 11/21/19 1030  . sodium chloride flush (NS) 0.9 % injection 20 mL  20 mL Intravenous PRN [name], MD   20 mL at 07/21/19 1053    ALLERGIES:  No Known Allergies  PHYSICAL EXAM:  Performance status (ECOG): 1 - Symptomatic but completely ambulatory  There were no vitals filed for this visit. Wt Readings from Last 3 Encounters:  08/09/20 226 lb (102.5 kg)  05/10/20 230 lb (104.3 kg)  03/22/20 231 lb 3.2 oz (104.9 kg)   Physical Exam Vitals reviewed.  Constitutional:      Appearance: Normal appearance.  Cardiovascular:     Rate and Rhythm: Normal rate and regular rhythm.     Pulses: Normal pulses.     Heart sounds: Normal heart sounds.  Pulmonary:     Effort: Pulmonary effort is normal.     Breath sounds: Normal breath sounds.  Abdominal:     Palpations: There is no hepatomegaly, splenomegaly or mass.     Tenderness: There is no abdominal tenderness.  Musculoskeletal:     Right lower leg: Edema (1+) present.     Left lower leg: Edema (1+) present.  Neurological:     General: No focal deficit present.     Mental Status: He is alert and oriented to person, place, and time.  Psychiatric:        Mood and Affect: Mood normal.        Behavior: Behavior normal.     LABORATORY DATA:  I have reviewed the labs as listed.  CBC Latest Ref Rng & Units 08/09/2020 06/28/2020 05/10/2020  WBC 4.0 - 10.5 K/uL 5.1 4.5 4.8  Hemoglobin 13.0 - 17.0 g/dL 14.4 14.4 14.5  Hematocrit 39.0 - 52.0 % 45.0 44.7 45.7  Platelets 150 - 400 K/uL 121(L) 121(L) 135(L)   CMP  Latest Ref Rng & Units 08/09/2020 06/28/2020 05/10/2020  Glucose 70 - 99 mg/dL 117(H) 92 92  BUN 8 - 23 mg/dL 20 18 25(H)  Creatinine 0.61 - 1.24 mg/dL 1.02 1.12 1.20  Sodium 135 - 145 mmol/L 142 140 141  Potassium 3.5 - 5.1 mmol/L 4.4 4.5 4.8  Chloride 98 - 111 mmol/L 107 105 106  CO2 22 - 32 mmol/L 25 29 28   Calcium 8.9 - 10.3 mg/dL 9.3 9.6 9.5  Total Protein 6.5 - 8.1 g/dL 6.3(L) 6.3(L) 6.4(L)  Total Bilirubin 0.3 - 1.2 mg/dL 0.8 0.7 0.5  Alkaline Phos 38 - 126 U/L 69 62 66  AST 15 - 41 U/L 30 20 26   ALT 0 - 44 U/L 30 23 34    DIAGNOSTIC IMAGING:  I have independently reviewed the scans and discussed with the patient. No results found.   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 -BMBX on 02/25/2019 after cycle 1 did not show any evidence of leukemia. -CT CAP on 02/12/2019 shows numerous bilateral irregular/spiculated pulmonary nodules measuring up to 14 mm, nonspecific. Hepatic steatosis. Spleen is normal. -I have talked to Dr.Ravandiat MD [place] per patient request. There are clinical trials available upon progression of his AML. There are also some clinical trials available now based on MRD positivity. Patient not interested in moving to [place] at this time.   PLAN:  1. AML: -He does not report any infections after last treatment. - No  GI side effects from venetoclax or decitabine. - Reviewed his labs today which showed white count 3.5 with ANC of 2.0.  Platelet count is mildly low at 120.  Differential was normal.  Hemoglobin is 14.3 and stable. - He will proceed with his next cycle of decitabine 5 days today. - He was told to start venetoclax 200 mg daily for 2 weeks. - RTC 6 weeks for follow-up.  2. Arthralgias: -He is taking hydrocodone 5/325 2 tablets every 6 hours. - I will change it to hydrocodone 10/325 1 tablet every 6 hours.  3.  Hypomagnesemia: -Continue magnesium 3 times a day.  Magnesium is 1.9 today.  4. Hypertension: -Continue Norvasc 2.5 mg daily.  5. CKD: -Creatinine is 1.04 and improved since lisinopril is on hold.  6. Dementia: -Continue Aricept 10 mg daily.  He is tolerating well.   Orders placed this encounter:  No orders of the defined types were placed in this encounter.    [name], MD [place] [phone]   I, Thana Ates, am acting as a scribe for Dr. [name].  I, [name] MD, have reviewed the above documentation for accuracy and completeness, and I agree with the above.

## 2020-09-20 ENCOUNTER — Inpatient Hospital Stay (HOSPITAL_COMMUNITY): Payer: Medicare Other

## 2020-09-20 ENCOUNTER — Encounter (HOSPITAL_COMMUNITY): Payer: Self-pay | Admitting: Hematology

## 2020-09-20 ENCOUNTER — Inpatient Hospital Stay (HOSPITAL_COMMUNITY): Payer: Medicare Other | Attending: Hematology | Admitting: Hematology

## 2020-09-20 ENCOUNTER — Other Ambulatory Visit: Payer: Self-pay

## 2020-09-20 VITALS — BP 148/88 | HR 75 | Temp 97.1°F | Resp 18

## 2020-09-20 VITALS — BP 129/71 | HR 72 | Temp 96.9°F | Resp 18 | Wt 224.0 lb

## 2020-09-20 DIAGNOSIS — F0390 Unspecified dementia without behavioral disturbance: Secondary | ICD-10-CM | POA: Insufficient documentation

## 2020-09-20 DIAGNOSIS — Z5111 Encounter for antineoplastic chemotherapy: Secondary | ICD-10-CM | POA: Diagnosis not present

## 2020-09-20 DIAGNOSIS — C9201 Acute myeloblastic leukemia, in remission: Secondary | ICD-10-CM | POA: Diagnosis not present

## 2020-09-20 DIAGNOSIS — Z95828 Presence of other vascular implants and grafts: Secondary | ICD-10-CM

## 2020-09-20 DIAGNOSIS — I10 Essential (primary) hypertension: Secondary | ICD-10-CM | POA: Insufficient documentation

## 2020-09-20 DIAGNOSIS — M2550 Pain in unspecified joint: Secondary | ICD-10-CM | POA: Diagnosis not present

## 2020-09-20 DIAGNOSIS — N189 Chronic kidney disease, unspecified: Secondary | ICD-10-CM | POA: Insufficient documentation

## 2020-09-20 DIAGNOSIS — Z79899 Other long term (current) drug therapy: Secondary | ICD-10-CM | POA: Diagnosis not present

## 2020-09-20 DIAGNOSIS — C9200 Acute myeloblastic leukemia, not having achieved remission: Secondary | ICD-10-CM | POA: Diagnosis present

## 2020-09-20 DIAGNOSIS — D46Z Other myelodysplastic syndromes: Secondary | ICD-10-CM

## 2020-09-20 LAB — CBC WITH DIFFERENTIAL/PLATELET
Abs Immature Granulocytes: 0.05 10*3/uL (ref 0.00–0.07)
Basophils Absolute: 0 10*3/uL (ref 0.0–0.1)
Basophils Relative: 1 %
Eosinophils Absolute: 0.1 10*3/uL (ref 0.0–0.5)
Eosinophils Relative: 3 %
HCT: 44.8 % (ref 39.0–52.0)
Hemoglobin: 14.3 g/dL (ref 13.0–17.0)
Immature Granulocytes: 1 %
Lymphocytes Relative: 26 %
Lymphs Abs: 0.9 10*3/uL (ref 0.7–4.0)
MCH: 32.3 pg (ref 26.0–34.0)
MCHC: 31.9 g/dL (ref 30.0–36.0)
MCV: 101.1 fL — ABNORMAL HIGH (ref 80.0–100.0)
Monocytes Absolute: 0.3 10*3/uL (ref 0.1–1.0)
Monocytes Relative: 9 %
Neutro Abs: 2 10*3/uL (ref 1.7–7.7)
Neutrophils Relative %: 60 %
Platelets: 120 10*3/uL — ABNORMAL LOW (ref 150–400)
RBC: 4.43 MIL/uL (ref 4.22–5.81)
RDW: 14.9 % (ref 11.5–15.5)
WBC: 3.5 10*3/uL — ABNORMAL LOW (ref 4.0–10.5)
nRBC: 0 % (ref 0.0–0.2)

## 2020-09-20 LAB — COMPREHENSIVE METABOLIC PANEL
ALT: 26 U/L (ref 0–44)
AST: 22 U/L (ref 15–41)
Albumin: 3.8 g/dL (ref 3.5–5.0)
Alkaline Phosphatase: 72 U/L (ref 38–126)
Anion gap: 6 (ref 5–15)
BUN: 20 mg/dL (ref 8–23)
CO2: 28 mmol/L (ref 22–32)
Calcium: 9.5 mg/dL (ref 8.9–10.3)
Chloride: 106 mmol/L (ref 98–111)
Creatinine, Ser: 1.04 mg/dL (ref 0.61–1.24)
GFR, Estimated: >60 mL/min (ref >60)
Glucose, Bld: 94 mg/dL (ref 70–99)
Potassium: 4.4 mmol/L (ref 3.5–5.1)
Sodium: 140 mmol/L (ref 135–145)
Total Bilirubin: 0.7 mg/dL (ref 0.3–1.2)
Total Protein: 6.3 g/dL — ABNORMAL LOW (ref 6.5–8.1)

## 2020-09-20 LAB — LACTATE DEHYDROGENASE: LDH: 149 U/L (ref 98–192)

## 2020-09-20 LAB — MAGNESIUM: Magnesium: 1.9 mg/dL (ref 1.7–2.4)

## 2020-09-20 MED ORDER — HYDROCODONE-ACETAMINOPHEN 10-325 MG PO TABS: 1.0000 | ORAL_TABLET | Freq: Four times a day (QID) | ORAL | 0 refills | Status: DC | PRN

## 2020-09-20 MED ORDER — SODIUM CHLORIDE 0.9 % IV SOLN
15.0000 mg/m2 | Freq: Once | INTRAVENOUS | Status: AC
  Administered 2020-09-20: 35 mg via INTRAVENOUS
  Filled 2020-09-20: qty 7

## 2020-09-20 MED ORDER — SODIUM CHLORIDE 0.9 % IV SOLN
Freq: Once | INTRAVENOUS | Status: AC
Start: 2020-09-20 — End: 2020-09-20

## 2020-09-20 MED ORDER — SODIUM CHLORIDE 0.9 % IV SOLN
8.0000 mg | Freq: Once | INTRAVENOUS | Status: AC
  Administered 2020-09-20: 8 mg via INTRAVENOUS
  Filled 2020-09-20: qty 8

## 2020-09-20 MED ORDER — HEPARIN SOD (PORK) LOCK FLUSH 100 UNIT/ML IV SOLN
500.0000 [IU] | Freq: Once | INTRAVENOUS | Status: AC | PRN
  Administered 2020-09-20: 500 [IU]

## 2020-09-20 MED ORDER — SODIUM CHLORIDE 0.9% FLUSH: 10.0000 mL | INTRAVENOUS | Status: DC | PRN

## 2020-09-20 NOTE — Progress Notes (Signed)
Patient assessed and labs reviewed by Dr Katragadda.  No distress noted.  Okay for treatment today.  

## 2020-09-20 NOTE — Patient Instructions (Addendum)
[place] at [facility] Discharge Instructions  You were seen today by Dr. Delton Coombes. He went over your recent results. Take 1 tablet of NORCO twice daily. You received treatment today. Dr. Delton Coombes will see you back in 6 weeks for labs and follow up.   Thank you for choosing [place] at [facility] to provide your oncology and hematology care.  To afford each patient quality time with our provider, please arrive at least 15 minutes before your scheduled appointment time.   If you have a lab appointment with the [facility] please come in thru the Main Entrance and check in at the main information desk  You need to re-schedule your appointment should you arrive 10 or more minutes late.  We strive to give you quality time with our providers, and arriving late affects you and other patients whose appointments are after yours.  Also, if you no show three or more times for appointments you may be dismissed from the clinic at the providers discretion.     Again, thank you for choosing [facility].  Our hope is that these requests will decrease the amount of time that you wait before being seen by our physicians.       _____________________________________________________________  Should you have questions after your visit to [facility], please contact our office at [phone] between the hours of 8:00 a.m. and 4:30 p.m.  Voicemails left after 4:00 p.m. will not be returned until the following business day.  For prescription refill requests, have your pharmacy contact our office and allow 72 hours.    Cancer Center Support Programs:   > Cancer Support Group  2nd Tuesday of the month 1pm-2pm, Journey Room

## 2020-09-20 NOTE — Progress Notes (Signed)
Patient tolerated chemotherapy with no complaints voiced.  Side effects with management reviewed with understanding verbalized.  Port site clean and dry with no bruising or swelling noted at site.  Good blood return noted before and after administration of chemotherapy.  Band aid applied.  Patient left in satisfactory condition with VSS and no s/s of distress noted.   

## 2020-09-20 NOTE — Patient Instructions (Signed)
[facility]  Discharge Instructions: °Thank you for choosing [facility] to provide your oncology and hematology care.  °If you have a lab appointment with the Cancer Center, please come in thru the Main Entrance and check in at the main information desk. ° °Wear comfortable clothing and clothing appropriate for easy access to any Portacath or PICC line.  ° °We strive to give you quality time with your provider. You may need to reschedule your appointment if you arrive late (15 or more minutes).  Arriving late affects you and other patients whose appointments are after yours.  Also, if you miss three or more appointments without notifying the office, you may be dismissed from the clinic at the provider’s discretion.    °  °For prescription refill requests, have your pharmacy contact our office and allow 72 hours for refills to be completed.   ° °Today you received the following chemotherapy and/or immunotherapy agents: Decitabine    °  °To help prevent nausea and vomiting after your treatment, we encourage you to take your nausea medication as directed. ° °BELOW ARE SYMPTOMS THAT SHOULD BE REPORTED IMMEDIATELY: °*FEVER GREATER THAN 100.4 F (38 °C) OR HIGHER °*CHILLS OR SWEATING °*NAUSEA AND VOMITING THAT IS NOT CONTROLLED WITH YOUR NAUSEA MEDICATION °*UNUSUAL SHORTNESS OF BREATH °*UNUSUAL BRUISING OR BLEEDING °*URINARY PROBLEMS (pain or burning when urinating, or frequent urination) °*BOWEL PROBLEMS (unusual diarrhea, constipation, pain near the anus) °TENDERNESS IN MOUTH AND THROAT WITH OR WITHOUT PRESENCE OF ULCERS (sore throat, sores in mouth, or a toothache) °UNUSUAL RASH, SWELLING OR PAIN  °UNUSUAL VAGINAL DISCHARGE OR ITCHING  ° °Items with * indicate a potential emergency and should be followed up as soon as possible or go to the Emergency Department if any problems should occur. ° °Please show the CHEMOTHERAPY ALERT CARD or IMMUNOTHERAPY ALERT CARD at check-in to the Emergency  Department and triage nurse. ° °Should you have questions after your visit or need to cancel or reschedule your appointment, please contact [facility] [phone]  and follow the prompts.  Office hours are 8:00 a.m. to 4:30 p.m. Monday - Friday. Please note that voicemails left after 4:00 p.m. may not be returned until the following business day.  We are closed weekends and major holidays. You have access to a nurse at all times for urgent questions. Please call the main number to the clinic [phone] and follow the prompts. ° °For any non-urgent questions, you may also contact your provider using MyChart. We now offer e-Visits for anyone 18 and older to request care online for non-urgent symptoms. For details visit [email or web address]. °  °Also download the MyChart app! Go to the app store, search "MyChart", open the app, select , and log in with your MyChart username and password. ° °Due to Covid, a mask is required upon entering the hospital/clinic. If you do not have a mask, one will be given to you upon arrival. For doctor visits, patients may have 1 support person aged 18 or older with them. For treatment visits, patients cannot have anyone with them due to current Covid guidelines and our immunocompromised population.  °

## 2020-09-21 ENCOUNTER — Inpatient Hospital Stay (HOSPITAL_COMMUNITY): Payer: Medicare Other

## 2020-09-21 ENCOUNTER — Encounter (HOSPITAL_COMMUNITY): Payer: Self-pay

## 2020-09-21 VITALS — BP 142/78 | HR 64 | Temp 97.1°F | Resp 16 | Ht 70.0 in

## 2020-09-21 DIAGNOSIS — C9200 Acute myeloblastic leukemia, not having achieved remission: Secondary | ICD-10-CM

## 2020-09-21 DIAGNOSIS — Z5111 Encounter for antineoplastic chemotherapy: Secondary | ICD-10-CM | POA: Diagnosis not present

## 2020-09-21 DIAGNOSIS — Z95828 Presence of other vascular implants and grafts: Secondary | ICD-10-CM

## 2020-09-21 DIAGNOSIS — D46Z Other myelodysplastic syndromes: Secondary | ICD-10-CM

## 2020-09-21 MED ORDER — SODIUM CHLORIDE 0.9 % IV SOLN: Freq: Once | INTRAVENOUS | Status: AC

## 2020-09-21 MED ORDER — SODIUM CHLORIDE 0.9% FLUSH
10.0000 mL | INTRAVENOUS | Status: DC | PRN
  Administered 2020-09-21: 10 mL

## 2020-09-21 MED ORDER — SODIUM CHLORIDE 0.9 % IV SOLN
15.0000 mg/m2 | Freq: Once | INTRAVENOUS | Status: AC
  Administered 2020-09-21: 35 mg via INTRAVENOUS
  Filled 2020-09-21: qty 7

## 2020-09-21 MED ORDER — SODIUM CHLORIDE 0.9 % IV SOLN
8.0000 mg | Freq: Once | INTRAVENOUS | Status: AC
  Administered 2020-09-21: 8 mg via INTRAVENOUS
  Filled 2020-09-21: qty 4

## 2020-09-21 MED ORDER — HEPARIN SOD (PORK) LOCK FLUSH 100 UNIT/ML IV SOLN
500.0000 [IU] | Freq: Once | INTRAVENOUS | Status: AC | PRN
  Administered 2020-09-21: 500 [IU]

## 2020-09-21 NOTE — Patient Instructions (Signed)
[facility]  Discharge Instructions: ?Thank you for choosing [facility] to provide your oncology and hematology care.  ?If you have a lab appointment with the Cancer Center, please come in thru the Main Entrance and check in at the main information desk. ? ?Wear comfortable clothing and clothing appropriate for easy access to any Portacath or PICC line.  ? ?We strive to give you quality time with your provider. You may need to reschedule your appointment if you arrive late (15 or more minutes).  Arriving late affects you and other patients whose appointments are after yours.  Also, if you miss three or more appointments without notifying the office, you may be dismissed from the clinic at the provider?s discretion.    ?  ?For prescription refill requests, have your pharmacy contact our office and allow 72 hours for refills to be completed.   ? ?Today you received the following chemotherapy and/or immunotherapy agents Decitabine, return as scheduled. ?  ?To help prevent nausea and vomiting after your treatment, we encourage you to take your nausea medication as directed. ? ?BELOW ARE SYMPTOMS THAT SHOULD BE REPORTED IMMEDIATELY: ?*FEVER GREATER THAN 100.4 F (38 ?C) OR HIGHER ?*CHILLS OR SWEATING ?*NAUSEA AND VOMITING THAT IS NOT CONTROLLED WITH YOUR NAUSEA MEDICATION ?*UNUSUAL SHORTNESS OF BREATH ?*UNUSUAL BRUISING OR BLEEDING ?*URINARY PROBLEMS (pain or burning when urinating, or frequent urination) ?*BOWEL PROBLEMS (unusual diarrhea, constipation, pain near the anus) ?TENDERNESS IN MOUTH AND THROAT WITH OR WITHOUT PRESENCE OF ULCERS (sore throat, sores in mouth, or a toothache) ?UNUSUAL RASH, SWELLING OR PAIN  ?UNUSUAL VAGINAL DISCHARGE OR ITCHING  ? ?Items with * indicate a potential emergency and should be followed up as soon as possible or go to the Emergency Department if any problems should occur. ? ?Please show the CHEMOTHERAPY ALERT CARD or IMMUNOTHERAPY ALERT CARD at check-in to  the Emergency Department and triage nurse. ? ?Should you have questions after your visit or need to cancel or reschedule your appointment, please contact  CANCER CENTER [phone]  and follow the prompts.  Office hours are 8:00 a.m. to 4:30 p.m. Monday - Friday. Please note that voicemails left after 4:00 p.m. may not be returned until the following business day.  We are closed weekends and major holidays. You have access to a nurse at all times for urgent questions. Please call the main number to the clinic [phone] and follow the prompts. ? ?For any non-urgent questions, you may also contact your provider using MyChart. We now offer e-Visits for anyone 18 and older to request care online for non-urgent symptoms. For details visit [email or web address]. ?  ?Also download the MyChart app! Go to the app store, search "MyChart", open the app, select [facility], and log in with your MyChart username and password. ? ?Due to Covid, a mask is required upon entering the hospital/clinic. If you do not have a mask, one will be given to you upon arrival. For doctor visits, patients may have 1 support person aged 18 or older with them. For treatment visits, patients cannot have anyone with them due to current Covid guidelines and our immunocompromised population.  ?

## 2020-09-21 NOTE — Progress Notes (Signed)
Patient tolerated chemotherapy with no complaints voiced. Side effects with management reviewed understanding verbalized. Port site clean and dry with no bruising or swelling noted at site. Good blood return noted before and after administration of chemotherapy. Band aid applied. Patient left in satisfactory condition with VSS and no s/s of distress noted. 

## 2020-09-22 ENCOUNTER — Inpatient Hospital Stay (HOSPITAL_COMMUNITY): Payer: Medicare Other

## 2020-09-22 ENCOUNTER — Other Ambulatory Visit: Payer: Self-pay

## 2020-09-22 ENCOUNTER — Encounter (HOSPITAL_COMMUNITY): Payer: Self-pay

## 2020-09-22 VITALS — BP 126/70 | HR 64 | Temp 97.1°F | Resp 18

## 2020-09-22 DIAGNOSIS — Z95828 Presence of other vascular implants and grafts: Secondary | ICD-10-CM

## 2020-09-22 DIAGNOSIS — C9200 Acute myeloblastic leukemia, not having achieved remission: Secondary | ICD-10-CM

## 2020-09-22 DIAGNOSIS — D46Z Other myelodysplastic syndromes: Secondary | ICD-10-CM

## 2020-09-22 DIAGNOSIS — Z5111 Encounter for antineoplastic chemotherapy: Secondary | ICD-10-CM | POA: Diagnosis not present

## 2020-09-22 MED ORDER — SODIUM CHLORIDE 0.9 % IV SOLN
Freq: Once | INTRAVENOUS | Status: AC
Start: 2020-09-22 — End: 2020-09-22

## 2020-09-22 MED ORDER — SODIUM CHLORIDE 0.9 % IV SOLN
8.0000 mg | Freq: Once | INTRAVENOUS | Status: AC
  Administered 2020-09-22: 8 mg via INTRAVENOUS
  Filled 2020-09-22: qty 8

## 2020-09-22 MED ORDER — SODIUM CHLORIDE 0.9 % IV SOLN
15.0000 mg/m2 | Freq: Once | INTRAVENOUS | Status: AC
  Administered 2020-09-22: 35 mg via INTRAVENOUS
  Filled 2020-09-22: qty 7

## 2020-09-22 MED ORDER — HEPARIN SOD (PORK) LOCK FLUSH 100 UNIT/ML IV SOLN
500.0000 [IU] | Freq: Once | INTRAVENOUS | Status: AC | PRN
  Administered 2020-09-22: 500 [IU]

## 2020-09-22 MED ORDER — SODIUM CHLORIDE 0.9% FLUSH
10.0000 mL | INTRAVENOUS | Status: DC | PRN
  Administered 2020-09-22: 10 mL

## 2020-09-22 NOTE — Patient Instructions (Signed)
[facility]  Discharge Instructions: ?Thank you for choosing [facility] to provide your oncology and hematology care.  ?If you have a lab appointment with the Cancer Center, please come in thru the Main Entrance and check in at the main information desk. ? ?Wear comfortable clothing and clothing appropriate for easy access to any Portacath or PICC line.  ? ?We strive to give you quality time with your provider. You may need to reschedule your appointment if you arrive late (15 or more minutes).  Arriving late affects you and other patients whose appointments are after yours.  Also, if you miss three or more appointments without notifying the office, you may be dismissed from the clinic at the provider?s discretion.    ?  ?For prescription refill requests, have your pharmacy contact our office and allow 72 hours for refills to be completed.   ? ?Today you received the following chemotherapy and/or immunotherapy agents Decitabine, return as scheduled. ?  ?To help prevent nausea and vomiting after your treatment, we encourage you to take your nausea medication as directed. ? ?BELOW ARE SYMPTOMS THAT SHOULD BE REPORTED IMMEDIATELY: ?*FEVER GREATER THAN 100.4 F (38 ?C) OR HIGHER ?*CHILLS OR SWEATING ?*NAUSEA AND VOMITING THAT IS NOT CONTROLLED WITH YOUR NAUSEA MEDICATION ?*UNUSUAL SHORTNESS OF BREATH ?*UNUSUAL BRUISING OR BLEEDING ?*URINARY PROBLEMS (pain or burning when urinating, or frequent urination) ?*BOWEL PROBLEMS (unusual diarrhea, constipation, pain near the anus) ?TENDERNESS IN MOUTH AND THROAT WITH OR WITHOUT PRESENCE OF ULCERS (sore throat, sores in mouth, or a toothache) ?UNUSUAL RASH, SWELLING OR PAIN  ?UNUSUAL VAGINAL DISCHARGE OR ITCHING  ? ?Items with * indicate a potential emergency and should be followed up as soon as possible or go to the Emergency Department if any problems should occur. ? ?Please show the CHEMOTHERAPY ALERT CARD or IMMUNOTHERAPY ALERT CARD at check-in to  the Emergency Department and triage nurse. ? ?Should you have questions after your visit or need to cancel or reschedule your appointment, please contact [facility] [phone]  and follow the prompts.  Office hours are 8:00 a.m. to 4:30 p.m. Monday - Friday. Please note that voicemails left after 4:00 p.m. may not be returned until the following business day.  We are closed weekends and major holidays. You have access to a nurse at all times for urgent questions. Please call the main number to the clinic [phone] and follow the prompts. ? ?For any non-urgent questions, you may also contact your provider using MyChart. We now offer e-Visits for anyone 18 and older to request care online for non-urgent symptoms. For details visit [email or web address]. ?  ?Also download the MyChart app! Go to the app store, search "MyChart", open the app, select [facility], and log in with your MyChart username and password. ? ?Due to Covid, a mask is required upon entering the hospital/clinic. If you do not have a mask, one will be given to you upon arrival. For doctor visits, patients may have 1 support person aged 18 or older with them. For treatment visits, patients cannot have anyone with them due to current Covid guidelines and our immunocompromised population.  ?

## 2020-09-22 NOTE — Progress Notes (Signed)
Patient tolerated chemotherapy with no complaints voiced. Side effects with management reviewed understanding verbalized. Port site clean and dry with no bruising or swelling noted at site. Good blood return noted before and after administration of chemotherapy. Band aid applied. Patient left in satisfactory condition with VSS and no s/s of distress noted. 

## 2020-09-23 ENCOUNTER — Encounter (HOSPITAL_COMMUNITY): Payer: Self-pay

## 2020-09-23 ENCOUNTER — Inpatient Hospital Stay (HOSPITAL_COMMUNITY): Payer: Medicare Other

## 2020-09-23 VITALS — BP 131/65 | HR 66 | Temp 96.9°F | Resp 18

## 2020-09-23 DIAGNOSIS — Z5111 Encounter for antineoplastic chemotherapy: Secondary | ICD-10-CM | POA: Diagnosis not present

## 2020-09-23 DIAGNOSIS — D46Z Other myelodysplastic syndromes: Secondary | ICD-10-CM

## 2020-09-23 DIAGNOSIS — C9200 Acute myeloblastic leukemia, not having achieved remission: Secondary | ICD-10-CM

## 2020-09-23 DIAGNOSIS — Z95828 Presence of other vascular implants and grafts: Secondary | ICD-10-CM

## 2020-09-23 MED ORDER — SODIUM CHLORIDE 0.9 % IV SOLN
15.0000 mg/m2 | Freq: Once | INTRAVENOUS | Status: AC
  Administered 2020-09-23: 35 mg via INTRAVENOUS
  Filled 2020-09-23: qty 7

## 2020-09-23 MED ORDER — SODIUM CHLORIDE 0.9 % IV SOLN
8.0000 mg | Freq: Once | INTRAVENOUS | Status: AC
  Administered 2020-09-23: 8 mg via INTRAVENOUS
  Filled 2020-09-23: qty 8

## 2020-09-23 MED ORDER — SODIUM CHLORIDE 0.9% FLUSH
10.0000 mL | INTRAVENOUS | Status: DC | PRN
  Administered 2020-09-23: 10 mL

## 2020-09-23 MED ORDER — SODIUM CHLORIDE 0.9 % IV SOLN: Freq: Once | INTRAVENOUS | Status: AC

## 2020-09-23 MED ORDER — HEPARIN SOD (PORK) LOCK FLUSH 100 UNIT/ML IV SOLN
500.0000 [IU] | Freq: Once | INTRAVENOUS | Status: AC | PRN
  Administered 2020-09-23: 500 [IU]

## 2020-09-23 NOTE — Patient Instructions (Signed)
[facility]  Discharge Instructions: °Thank you for choosing [facility] to provide your oncology and hematology care.  °If you have a lab appointment with the Cancer Center, please come in thru the Main Entrance and check in at the main information desk. ° °Wear comfortable clothing and clothing appropriate for easy access to any Portacath or PICC line.  ° °We strive to give you quality time with your provider. You may need to reschedule your appointment if you arrive late (15 or more minutes).  Arriving late affects you and other patients whose appointments are after yours.  Also, if you miss three or more appointments without notifying the office, you may be dismissed from the clinic at the provider’s discretion.    °  °For prescription refill requests, have your pharmacy contact our office and allow 72 hours for refills to be completed.   ° °Today you received the following chemotherapy and/or immunotherapy agents: Decitabine    °  °To help prevent nausea and vomiting after your treatment, we encourage you to take your nausea medication as directed. ° °BELOW ARE SYMPTOMS THAT SHOULD BE REPORTED IMMEDIATELY: °*FEVER GREATER THAN 100.4 F (38 °C) OR HIGHER °*CHILLS OR SWEATING °*NAUSEA AND VOMITING THAT IS NOT CONTROLLED WITH YOUR NAUSEA MEDICATION °*UNUSUAL SHORTNESS OF BREATH °*UNUSUAL BRUISING OR BLEEDING °*URINARY PROBLEMS (pain or burning when urinating, or frequent urination) °*BOWEL PROBLEMS (unusual diarrhea, constipation, pain near the anus) °TENDERNESS IN MOUTH AND THROAT WITH OR WITHOUT PRESENCE OF ULCERS (sore throat, sores in mouth, or a toothache) °UNUSUAL RASH, SWELLING OR PAIN  °UNUSUAL VAGINAL DISCHARGE OR ITCHING  ° °Items with * indicate a potential emergency and should be followed up as soon as possible or go to the Emergency Department if any problems should occur. ° °Please show the CHEMOTHERAPY ALERT CARD or IMMUNOTHERAPY ALERT CARD at check-in to the Emergency  Department and triage nurse. ° °Should you have questions after your visit or need to cancel or reschedule your appointment, please contact [facility] [phone]  and follow the prompts.  Office hours are 8:00 a.m. to 4:30 p.m. Monday - Friday. Please note that voicemails left after 4:00 p.m. may not be returned until the following business day.  We are closed weekends and major holidays. You have access to a nurse at all times for urgent questions. Please call the main number to the clinic [phone] and follow the prompts. ° °For any non-urgent questions, you may also contact your provider using MyChart. We now offer e-Visits for anyone 18 and older to request care online for non-urgent symptoms. For details visit [email or web address]. °  °Also download the MyChart app! Go to the app store, search "MyChart", open the app, select [facility], and log in with your MyChart username and password. ° °Due to Covid, a mask is required upon entering the hospital/clinic. If you do not have a mask, one will be given to you upon arrival. For doctor visits, patients may have 1 support person aged 18 or older with them. For treatment visits, patients cannot have anyone with them due to current Covid guidelines and our immunocompromised population.  °

## 2020-09-23 NOTE — Progress Notes (Signed)
Patient presents today for Day 4 Vidaza. Vital signs stable. Patient denies any changes since his last treatment yesterday. MAR reviewed and updated. Patient requests needle to be removed after treatment today due to he has yard work he wants to do and does not want to be left accessed.   Treatment given today per MD orders. Tolerated infusion without adverse affects. Vital signs stable. No complaints at this time. Discharged from clinic motorized scooter in stable condition. Alert and oriented x 3. F/U with [facility] as scheduled.

## 2020-09-24 ENCOUNTER — Inpatient Hospital Stay (HOSPITAL_COMMUNITY): Payer: Medicare Other

## 2020-09-24 ENCOUNTER — Other Ambulatory Visit: Payer: Self-pay

## 2020-09-24 VITALS — BP 121/65 | HR 64 | Temp 97.0°F | Resp 18

## 2020-09-24 DIAGNOSIS — Z5111 Encounter for antineoplastic chemotherapy: Secondary | ICD-10-CM | POA: Diagnosis not present

## 2020-09-24 DIAGNOSIS — Z95828 Presence of other vascular implants and grafts: Secondary | ICD-10-CM

## 2020-09-24 DIAGNOSIS — D46Z Other myelodysplastic syndromes: Secondary | ICD-10-CM

## 2020-09-24 DIAGNOSIS — C9200 Acute myeloblastic leukemia, not having achieved remission: Secondary | ICD-10-CM

## 2020-09-24 MED ORDER — HEPARIN SOD (PORK) LOCK FLUSH 100 UNIT/ML IV SOLN
500.0000 [IU] | Freq: Once | INTRAVENOUS | Status: AC | PRN
  Administered 2020-09-24: 500 [IU]

## 2020-09-24 MED ORDER — SODIUM CHLORIDE 0.9 % IV SOLN
8.0000 mg | Freq: Once | INTRAVENOUS | Status: AC
  Administered 2020-09-24: 8 mg via INTRAVENOUS
  Filled 2020-09-24: qty 8

## 2020-09-24 MED ORDER — SODIUM CHLORIDE 0.9 % IV SOLN
15.0000 mg/m2 | Freq: Once | INTRAVENOUS | Status: AC
  Administered 2020-09-24: 35 mg via INTRAVENOUS
  Filled 2020-09-24: qty 7

## 2020-09-24 MED ORDER — SODIUM CHLORIDE 0.9% FLUSH
10.0000 mL | INTRAVENOUS | Status: DC | PRN
  Administered 2020-09-24: 10 mL

## 2020-09-24 MED ORDER — SODIUM CHLORIDE 0.9 % IV SOLN: Freq: Once | INTRAVENOUS | Status: AC

## 2020-09-24 NOTE — Progress Notes (Signed)
Patient presents today for day 5 Decitabine.  Vital signs within parameters for treatment.  Patient has no new complaints since last visit.  Decitabine infusion given today per MD orders.  Stable during infusion without adverse affects.  Vital signs stable.  No complaints at this time.  Discharge from clinic via motorized scooter in stable condition.  Alert and oriented X 3.  Follow up with [facility] as scheduled.

## 2020-09-24 NOTE — Patient Instructions (Signed)
[facility]  Discharge Instructions: Thank you for choosing [facility] to provide your oncology and hematology care.  If you have a lab appointment with the [facility], please come in thru the Main Entrance and check in at the main information desk.  Wear comfortable clothing and clothing appropriate for easy access to any Portacath or PICC line.   We strive to give you quality time with your provider. You may need to reschedule your appointment if you arrive late (15 or more minutes).  Arriving late affects you and other patients whose appointments are after yours.  Also, if you miss three or more appointments without notifying the office, you may be dismissed from the clinic at the provider's discretion.      For prescription refill requests, have your pharmacy contact our office and allow 72 hours for refills to be completed.    Today you received the following chemotherapy and/or immunotherapy agents decitabine infusion     To help prevent nausea and vomiting after your treatment, we encourage you to take your nausea medication as directed.  BELOW ARE SYMPTOMS THAT SHOULD BE REPORTED IMMEDIATELY: . *FEVER GREATER THAN 100.4 F (38 C) OR HIGHER . *CHILLS OR SWEATING . *NAUSEA AND VOMITING THAT IS NOT CONTROLLED WITH YOUR NAUSEA MEDICATION . *UNUSUAL SHORTNESS OF BREATH . *UNUSUAL BRUISING OR BLEEDING . *URINARY PROBLEMS (pain or burning when urinating, or frequent urination) . *BOWEL PROBLEMS (unusual diarrhea, constipation, pain near the anus) . TENDERNESS IN MOUTH AND THROAT WITH OR WITHOUT PRESENCE OF ULCERS (sore throat, sores in mouth, or a toothache) . UNUSUAL RASH, SWELLING OR PAIN  . UNUSUAL VAGINAL DISCHARGE OR ITCHING   Items with * indicate a potential emergency and should be followed up as soon as possible or go to the Emergency Department if any problems should occur.  Please show the CHEMOTHERAPY ALERT CARD or IMMUNOTHERAPY ALERT CARD at  check-in to the Emergency Department and triage nurse.  Should you have questions after your visit or need to cancel or reschedule your appointment, please contact [facility] [phone]  and follow the prompts.  Office hours are 8:00 a.m. to 4:30 p.m. Monday - Friday. Please note that voicemails left after 4:00 p.m. may not be returned until the following business day.  We are closed weekends and major holidays. You have access to a nurse at all times for urgent questions. Please call the main number to the clinic [phone] and follow the prompts.  For any non-urgent questions, you may also contact your provider using MyChart. We now offer e-Visits for anyone 52 and older to request care online for non-urgent symptoms. For details visit [email or web address].   Also download the MyChart app! Go to the app store, search "MyChart", open the app, select [facility], and log in with your MyChart username and password.  Due to Covid, a mask is required upon entering the hospital/clinic. If you do not have a mask, one will be given to you upon arrival. For doctor visits, patients may have 1 support person aged 27 or older with them. For treatment visits, patients cannot have anyone with them due to current Covid guidelines and our immunocompromised population.

## 2020-10-14 ENCOUNTER — Other Ambulatory Visit (HOSPITAL_COMMUNITY): Payer: Self-pay

## 2020-10-20 ENCOUNTER — Other Ambulatory Visit (HOSPITAL_COMMUNITY): Payer: Self-pay

## 2020-10-20 MED ORDER — HYDROCODONE-ACETAMINOPHEN 10-325 MG PO TABS: 1.0000 | ORAL_TABLET | Freq: Four times a day (QID) | ORAL | 0 refills | Status: DC | PRN

## 2020-11-01 ENCOUNTER — Inpatient Hospital Stay (HOSPITAL_COMMUNITY): Payer: Medicare Other

## 2020-11-01 ENCOUNTER — Other Ambulatory Visit: Payer: Self-pay

## 2020-11-01 ENCOUNTER — Other Ambulatory Visit: Payer: Self-pay | Admitting: Oncology

## 2020-11-01 ENCOUNTER — Ambulatory Visit (HOSPITAL_COMMUNITY): Payer: Medicare Other | Admitting: Hematology

## 2020-11-01 ENCOUNTER — Inpatient Hospital Stay (HOSPITAL_COMMUNITY): Payer: Medicare Other | Attending: Hematology

## 2020-11-01 VITALS — BP 168/84 | HR 59 | Temp 97.0°F | Resp 18 | Wt 217.0 lb

## 2020-11-01 DIAGNOSIS — D46Z Other myelodysplastic syndromes: Secondary | ICD-10-CM

## 2020-11-01 DIAGNOSIS — Z5111 Encounter for antineoplastic chemotherapy: Secondary | ICD-10-CM | POA: Insufficient documentation

## 2020-11-01 DIAGNOSIS — C9200 Acute myeloblastic leukemia, not having achieved remission: Secondary | ICD-10-CM | POA: Diagnosis present

## 2020-11-01 DIAGNOSIS — Z95828 Presence of other vascular implants and grafts: Secondary | ICD-10-CM

## 2020-11-01 DIAGNOSIS — Z79899 Other long term (current) drug therapy: Secondary | ICD-10-CM | POA: Diagnosis not present

## 2020-11-01 DIAGNOSIS — C9201 Acute myeloblastic leukemia, in remission: Secondary | ICD-10-CM

## 2020-11-01 LAB — COMPREHENSIVE METABOLIC PANEL
ALT: 24 U/L (ref 0–44)
AST: 27 U/L (ref 15–41)
Albumin: 3.9 g/dL (ref 3.5–5.0)
Alkaline Phosphatase: 72 U/L (ref 38–126)
Anion gap: 8 (ref 5–15)
BUN: 18 mg/dL (ref 8–23)
CO2: 25 mmol/L (ref 22–32)
Calcium: 9.2 mg/dL (ref 8.9–10.3)
Chloride: 107 mmol/L (ref 98–111)
Creatinine, Ser: 0.93 mg/dL (ref 0.61–1.24)
GFR, Estimated: >60 mL/min (ref >60)
Glucose, Bld: 119 mg/dL — ABNORMAL HIGH (ref 70–99)
Potassium: 4.1 mmol/L (ref 3.5–5.1)
Sodium: 140 mmol/L (ref 135–145)
Total Bilirubin: 0.8 mg/dL (ref 0.3–1.2)
Total Protein: 6.3 g/dL — ABNORMAL LOW (ref 6.5–8.1)

## 2020-11-01 LAB — CBC WITH DIFFERENTIAL/PLATELET
Abs Immature Granulocytes: 0.04 10*3/uL (ref 0.00–0.07)
Basophils Absolute: 0 10*3/uL (ref 0.0–0.1)
Basophils Relative: 1 %
Eosinophils Absolute: 0.1 10*3/uL (ref 0.0–0.5)
Eosinophils Relative: 2 %
HCT: 44.8 % (ref 39.0–52.0)
Hemoglobin: 14.7 g/dL (ref 13.0–17.0)
Immature Granulocytes: 1 %
Lymphocytes Relative: 24 %
Lymphs Abs: 0.8 10*3/uL (ref 0.7–4.0)
MCH: 32.7 pg (ref 26.0–34.0)
MCHC: 32.8 g/dL (ref 30.0–36.0)
MCV: 99.6 fL (ref 80.0–100.0)
Monocytes Absolute: 0.2 10*3/uL (ref 0.1–1.0)
Monocytes Relative: 7 %
Neutro Abs: 2.2 10*3/uL (ref 1.7–7.7)
Neutrophils Relative %: 65 %
Platelets: 142 10*3/uL — ABNORMAL LOW (ref 150–400)
RBC: 4.5 MIL/uL (ref 4.22–5.81)
RDW: 14.6 % (ref 11.5–15.5)
WBC: 3.3 10*3/uL — ABNORMAL LOW (ref 4.0–10.5)
nRBC: 0 % (ref 0.0–0.2)

## 2020-11-01 LAB — LACTATE DEHYDROGENASE: LDH: 166 U/L (ref 98–192)

## 2020-11-01 LAB — MAGNESIUM: Magnesium: 2.1 mg/dL (ref 1.7–2.4)

## 2020-11-01 MED ORDER — HEPARIN SOD (PORK) LOCK FLUSH 100 UNIT/ML IV SOLN
500.0000 [IU] | Freq: Once | INTRAVENOUS | Status: AC | PRN
  Administered 2020-11-01: 500 [IU]

## 2020-11-01 MED ORDER — ONDANSETRON HCL 40 MG/20ML IJ SOLN
8.0000 mg | Freq: Once | INTRAMUSCULAR | Status: AC
  Administered 2020-11-01: 8 mg via INTRAVENOUS
  Filled 2020-11-01: qty 8

## 2020-11-01 MED ORDER — ALPRAZOLAM 0.25 MG PO TABS: 0.2500 mg | ORAL_TABLET | Freq: Every evening | ORAL | 0 refills | Status: DC | PRN

## 2020-11-01 MED ORDER — SODIUM CHLORIDE 0.9 % IV SOLN
15.0000 mg/m2 | Freq: Once | INTRAVENOUS | Status: AC
  Administered 2020-11-01: 35 mg via INTRAVENOUS
  Filled 2020-11-01: qty 7

## 2020-11-01 MED ORDER — SODIUM CHLORIDE 0.9 % IV SOLN: Freq: Once | INTRAVENOUS | Status: AC

## 2020-11-01 MED ORDER — SODIUM CHLORIDE 0.9% FLUSH
10.0000 mL | INTRAVENOUS | Status: DC | PRN
  Administered 2020-11-01: 10 mL

## 2020-11-01 NOTE — Patient Instructions (Signed)
[facility]  Discharge Instructions: Thank you for choosing [facility] to provide your oncology and hematology care.  If you have a lab appointment with the Cancer Center, please come in thru the Main Entrance and check in at the main information desk.  Wear comfortable clothing and clothing appropriate for easy access to any Portacath or PICC line.   We strive to give you quality time with your provider. You may need to reschedule your appointment if you arrive late (15 or more minutes).  Arriving late affects you and other patients whose appointments are after yours.  Also, if you miss three or more appointments without notifying the office, you may be dismissed from the clinic at the provider's discretion.      For prescription refill requests, have your pharmacy contact our office and allow 72 hours for refills to be completed.    Today you received the following chemotherapy and/or immunotherapy agents    To help prevent nausea and vomiting after your treatment, we encourage you to take your nausea medication as directed.  BELOW ARE SYMPTOMS THAT SHOULD BE REPORTED IMMEDIATELY: . *FEVER GREATER THAN 100.4 F (38 C) OR HIGHER . *CHILLS OR SWEATING . *NAUSEA AND VOMITING THAT IS NOT CONTROLLED WITH YOUR NAUSEA MEDICATION . *UNUSUAL SHORTNESS OF BREATH . *UNUSUAL BRUISING OR BLEEDING . *URINARY PROBLEMS (pain or burning when urinating, or frequent urination) . *BOWEL PROBLEMS (unusual diarrhea, constipation, pain near the anus) . TENDERNESS IN MOUTH AND THROAT WITH OR WITHOUT PRESENCE OF ULCERS (sore throat, sores in mouth, or a toothache) . UNUSUAL RASH, SWELLING OR PAIN  . UNUSUAL VAGINAL DISCHARGE OR ITCHING   Items with * indicate a potential emergency and should be followed up as soon as possible or go to the Emergency Department if any problems should occur.  Please show the CHEMOTHERAPY ALERT CARD or IMMUNOTHERAPY ALERT CARD at check-in to the  Emergency Department and triage nurse.  Should you have questions after your visit or need to cancel or reschedule your appointment, please contact [facility] [phone]  and follow the prompts.  Office hours are 8:00 a.m. to 4:30 p.m. Monday - Friday. Please note that voicemails left after 4:00 p.m. may not be returned until the following business day.  We are closed weekends and major holidays. You have access to a nurse at all times for urgent questions. Please call the main number to the clinic [phone] and follow the prompts.  For any non-urgent questions, you may also contact your provider using MyChart. We now offer e-Visits for anyone 18 and older to request care online for non-urgent symptoms. For details visit [email or web address].   Also download the MyChart app! Go to the app store, search "MyChart", open the app, select [facility], and log in with your MyChart username and password.  Due to Covid, a mask is required upon entering the hospital/clinic. If you do not have a mask, one will be given to you upon arrival. For doctor visits, patients may have 1 support person aged 18 or older with them. For treatment visits, patients cannot have anyone with them due to current Covid guidelines and our immunocompromised population.  

## 2020-11-01 NOTE — Progress Notes (Signed)
Treatment given per orders. Patient tolerated it well without problems. Vitals stable and discharged home from clinic ambulatory. Follow up as scheduled.  

## 2020-11-02 ENCOUNTER — Inpatient Hospital Stay (HOSPITAL_COMMUNITY): Payer: Medicare Other

## 2020-11-02 ENCOUNTER — Encounter (HOSPITAL_COMMUNITY): Payer: Self-pay

## 2020-11-02 VITALS — BP 152/91 | HR 59 | Temp 96.7°F | Resp 18

## 2020-11-02 DIAGNOSIS — Z5111 Encounter for antineoplastic chemotherapy: Secondary | ICD-10-CM | POA: Diagnosis not present

## 2020-11-02 DIAGNOSIS — C9200 Acute myeloblastic leukemia, not having achieved remission: Secondary | ICD-10-CM

## 2020-11-02 DIAGNOSIS — Z95828 Presence of other vascular implants and grafts: Secondary | ICD-10-CM

## 2020-11-02 DIAGNOSIS — D46Z Other myelodysplastic syndromes: Secondary | ICD-10-CM

## 2020-11-02 MED ORDER — SODIUM CHLORIDE 0.9 % IV SOLN
15.0000 mg/m2 | Freq: Once | INTRAVENOUS | Status: AC
  Administered 2020-11-02: 35 mg via INTRAVENOUS
  Filled 2020-11-02: qty 7

## 2020-11-02 MED ORDER — SODIUM CHLORIDE 0.9 % IV SOLN
8.0000 mg | Freq: Once | INTRAVENOUS | Status: AC
  Administered 2020-11-02: 8 mg via INTRAVENOUS
  Filled 2020-11-02: qty 8

## 2020-11-02 MED ORDER — SODIUM CHLORIDE 0.9 % IV SOLN
Freq: Once | INTRAVENOUS | Status: AC
Start: 2020-11-02 — End: 2020-11-02

## 2020-11-02 MED ORDER — SODIUM CHLORIDE 0.9% FLUSH
10.0000 mL | INTRAVENOUS | Status: DC | PRN
Start: 2020-11-02 — End: 2020-11-02
  Administered 2020-11-02: 10 mL

## 2020-11-02 MED ORDER — HEPARIN SOD (PORK) LOCK FLUSH 100 UNIT/ML IV SOLN
500.0000 [IU] | Freq: Once | INTRAVENOUS | Status: AC | PRN
  Administered 2020-11-02: 500 [IU]

## 2020-11-02 NOTE — Patient Instructions (Signed)
[facility]  Discharge Instructions: Thank you for choosing [facility] to provide your oncology and hematology care.  If you have a lab appointment with the [facility], please come in thru the Main Entrance and check in at the main information desk.  Wear comfortable clothing and clothing appropriate for easy access to any Portacath or PICC line.   We strive to give you quality time with your provider. You may need to reschedule your appointment if you arrive late (15 or more minutes).  Arriving late affects you and other patients whose appointments are after yours.  Also, if you miss three or more appointments without notifying the office, you may be dismissed from the clinic at the provider's discretion.      For prescription refill requests, have your pharmacy contact our office and allow 72 hours for refills to be completed.    Today you received the following chemotherapy and/or immunotherapy agents Decitabine .       To help prevent nausea and vomiting after your treatment, we encourage you to take your nausea medication as directed.  BELOW ARE SYMPTOMS THAT SHOULD BE REPORTED IMMEDIATELY: *FEVER GREATER THAN 100.4 F (38 C) OR HIGHER *CHILLS OR SWEATING *NAUSEA AND VOMITING THAT IS NOT CONTROLLED WITH YOUR NAUSEA MEDICATION *UNUSUAL SHORTNESS OF BREATH *UNUSUAL BRUISING OR BLEEDING *URINARY PROBLEMS (pain or burning when urinating, or frequent urination) *BOWEL PROBLEMS (unusual diarrhea, constipation, pain near the anus) TENDERNESS IN MOUTH AND THROAT WITH OR WITHOUT PRESENCE OF ULCERS (sore throat, sores in mouth, or a toothache) UNUSUAL RASH, SWELLING OR PAIN  UNUSUAL VAGINAL DISCHARGE OR ITCHING   Items with * indicate a potential emergency and should be followed up as soon as possible or go to the Emergency Department if any problems should occur.  Please show the CHEMOTHERAPY ALERT CARD or IMMUNOTHERAPY ALERT CARD at check-in to the Emergency  Department and triage nurse.  Should you have questions after your visit or need to cancel or reschedule your appointment, please contact [facility] [phone]  and follow the prompts.  Office hours are 8:00 a.m. to 4:30 p.m. Monday - Friday. Please note that voicemails left after 4:00 p.m. may not be returned until the following business day.  We are closed weekends and major holidays. You have access to a nurse at all times for urgent questions. Please call the main number to the clinic [phone] and follow the prompts.  For any non-urgent questions, you may also contact your provider using MyChart. We now offer e-Visits for anyone 76 and older to request care online for non-urgent symptoms. For details visit [email or web address].   Also download the MyChart app! Go to the app store, search "MyChart", open the app, select [facility], and log in with your MyChart username and password.  Due to Covid, a mask is required upon entering the hospital/clinic. If you do not have a mask, one will be given to you upon arrival. For doctor visits, patients may have 1 support person aged 76 or older with them. For treatment visits, patients cannot have anyone with them due to current Covid guidelines and our immunocompromised population.

## 2020-11-02 NOTE — Progress Notes (Signed)
Patient presents today for D2 Decitabine. Vital signs within parameters for treatment. Patient denies any significant changes since his last treatment. Patient has no new complaints or concerns related to side effects from his tx.   Treatment given today per MD orders. Tolerated infusion without adverse affects. Vital signs stable. No complaints at this time. Discharged from by motorized scooter in stable condition. Alert and oriented x 3. F/U with [facility] as scheduled.

## 2020-11-03 ENCOUNTER — Other Ambulatory Visit: Payer: Self-pay

## 2020-11-03 ENCOUNTER — Inpatient Hospital Stay (HOSPITAL_COMMUNITY): Payer: Medicare Other

## 2020-11-03 VITALS — BP 150/90 | HR 66 | Temp 96.7°F | Resp 18

## 2020-11-03 DIAGNOSIS — D46Z Other myelodysplastic syndromes: Secondary | ICD-10-CM

## 2020-11-03 DIAGNOSIS — C9200 Acute myeloblastic leukemia, not having achieved remission: Secondary | ICD-10-CM

## 2020-11-03 DIAGNOSIS — Z95828 Presence of other vascular implants and grafts: Secondary | ICD-10-CM

## 2020-11-03 DIAGNOSIS — Z5111 Encounter for antineoplastic chemotherapy: Secondary | ICD-10-CM | POA: Diagnosis not present

## 2020-11-03 MED ORDER — SODIUM CHLORIDE 0.9 % IV SOLN
Freq: Once | INTRAVENOUS | Status: AC
Start: 2020-11-03 — End: 2020-11-03

## 2020-11-03 MED ORDER — ONDANSETRON HCL 40 MG/20ML IJ SOLN
8.0000 mg | Freq: Once | INTRAMUSCULAR | Status: AC
  Administered 2020-11-03: 8 mg via INTRAVENOUS
  Filled 2020-11-03: qty 8

## 2020-11-03 MED ORDER — SODIUM CHLORIDE 0.9 % IV SOLN
15.0000 mg/m2 | Freq: Once | INTRAVENOUS | Status: AC
  Administered 2020-11-03: 35 mg via INTRAVENOUS
  Filled 2020-11-03: qty 7

## 2020-11-03 MED ORDER — SODIUM CHLORIDE 0.9% FLUSH
10.0000 mL | INTRAVENOUS | Status: DC | PRN
  Administered 2020-11-03: 10 mL

## 2020-11-03 MED ORDER — HEPARIN SOD (PORK) LOCK FLUSH 100 UNIT/ML IV SOLN
500.0000 [IU] | Freq: Once | INTRAVENOUS | Status: AC | PRN
  Administered 2020-11-03: 500 [IU]

## 2020-11-03 NOTE — Patient Instructions (Signed)
[facility]  Discharge Instructions: Thank you for choosing [facility] to provide your oncology and hematology care.  If you have a lab appointment with the [facility], please come in thru the Main Entrance and check in at the main information desk.  Wear comfortable clothing and clothing appropriate for easy access to any Portacath or PICC line.   We strive to give you quality time with your provider. You may need to reschedule your appointment if you arrive late (15 or more minutes).  Arriving late affects you and other patients whose appointments are after yours.  Also, if you miss three or more appointments without notifying the office, you may be dismissed from the clinic at the provider's discretion.      For prescription refill requests, have your pharmacy contact our office and allow 72 hours for refills to be completed.    Today you received the following: Vidaza   To help prevent nausea and vomiting after your treatment, we encourage you to take your nausea medication as directed.  BELOW ARE SYMPTOMS THAT SHOULD BE REPORTED IMMEDIATELY: *FEVER GREATER THAN 100.4 F (38 C) OR HIGHER *CHILLS OR SWEATING *NAUSEA AND VOMITING THAT IS NOT CONTROLLED WITH YOUR NAUSEA MEDICATION *UNUSUAL SHORTNESS OF BREATH *UNUSUAL BRUISING OR BLEEDING *URINARY PROBLEMS (pain or burning when urinating, or frequent urination) *BOWEL PROBLEMS (unusual diarrhea, constipation, pain near the anus) TENDERNESS IN MOUTH AND THROAT WITH OR WITHOUT PRESENCE OF ULCERS (sore throat, sores in mouth, or a toothache) UNUSUAL RASH, SWELLING OR PAIN  UNUSUAL VAGINAL DISCHARGE OR ITCHING   Items with * indicate a potential emergency and should be followed up as soon as possible or go to the Emergency Department if any problems should occur.  Please show the CHEMOTHERAPY ALERT CARD or IMMUNOTHERAPY ALERT CARD at check-in to the Emergency Department and triage nurse.  Should you have  questions after your visit or need to cancel or reschedule your appointment, please contact [facility] [phone]  and follow the prompts.  Office hours are 8:00 a.m. to 4:30 p.m. Monday - Friday. Please note that voicemails left after 4:00 p.m. may not be returned until the following business day.  We are closed weekends and major holidays. You have access to a nurse at all times for urgent questions. Please call the main number to the clinic [phone] and follow the prompts.  For any non-urgent questions, you may also contact your provider using MyChart. We now offer e-Visits for anyone 69 and older to request care online for non-urgent symptoms. For details visit [email or web address].   Also download the MyChart app! Go to the app store, search "MyChart", open the app, select [facility] [place], and log in with your MyChart username and password.  Due to Covid, a mask is required upon entering the hospital/clinic. If you do not have a mask, one will be given to you upon arrival. For doctor visits, patients may have 1 support person aged 51 or older with them. For treatment visits, patients cannot have anyone with them due to current Covid guidelines and our immunocompromised population.

## 2020-11-03 NOTE — Progress Notes (Signed)
Treatment given per orders. Patient tolerated it well without problems. Vitals stable and discharged home from clinic via personal motorized wheelchair. Follow up as scheduled.

## 2020-11-04 ENCOUNTER — Ambulatory Visit (HOSPITAL_COMMUNITY): Payer: Medicare Other

## 2020-11-04 ENCOUNTER — Inpatient Hospital Stay (HOSPITAL_COMMUNITY): Payer: Medicare Other

## 2020-11-04 VITALS — BP 139/84 | HR 61 | Temp 96.3°F | Resp 18

## 2020-11-04 DIAGNOSIS — Z95828 Presence of other vascular implants and grafts: Secondary | ICD-10-CM

## 2020-11-04 DIAGNOSIS — Z5111 Encounter for antineoplastic chemotherapy: Secondary | ICD-10-CM | POA: Diagnosis not present

## 2020-11-04 DIAGNOSIS — C9200 Acute myeloblastic leukemia, not having achieved remission: Secondary | ICD-10-CM

## 2020-11-04 DIAGNOSIS — D46Z Other myelodysplastic syndromes: Secondary | ICD-10-CM

## 2020-11-04 MED ORDER — SODIUM CHLORIDE 0.9% FLUSH
10.0000 mL | INTRAVENOUS | Status: DC | PRN
  Administered 2020-11-04: 10 mL

## 2020-11-04 MED ORDER — SODIUM CHLORIDE 0.9 % IV SOLN
8.0000 mg | Freq: Once | INTRAVENOUS | Status: AC
Start: 2020-11-04 — End: 2020-11-04
  Administered 2020-11-04: 8 mg via INTRAVENOUS
  Filled 2020-11-04: qty 8

## 2020-11-04 MED ORDER — SODIUM CHLORIDE 0.9 % IV SOLN: Freq: Once | INTRAVENOUS | Status: AC

## 2020-11-04 MED ORDER — SODIUM CHLORIDE 0.9 % IV SOLN
15.0000 mg/m2 | Freq: Once | INTRAVENOUS | Status: AC
  Administered 2020-11-04: 35 mg via INTRAVENOUS
  Filled 2020-11-04: qty 7

## 2020-11-04 MED ORDER — HEPARIN SOD (PORK) LOCK FLUSH 100 UNIT/ML IV SOLN
500.0000 [IU] | Freq: Once | INTRAVENOUS | Status: AC | PRN
  Administered 2020-11-04: 500 [IU]

## 2020-11-04 NOTE — Progress Notes (Signed)
Patient presents today for Day 4 Decitibine infusion.  Vital signs within parameters for treatment.  Patient has no new complaints at this time.  Decitabine infusion given today per MD orders.  Tolerated infusion without adverse affects.  Vital signs stable.  No complaints at this time.  Discharge from clinic via wheelchair in stable condition.  Alert and oriented X 3.  Follow up with [facility] as scheduled.

## 2020-11-04 NOTE — Patient Instructions (Signed)
[facility]  Discharge Instructions: Thank you for choosing [facility] to provide your oncology and hematology care.  If you have a lab appointment with the [facility], please come in thru the Main Entrance and check in at the main information desk.  Wear comfortable clothing and clothing appropriate for easy access to any Portacath or PICC line.   We strive to give you quality time with your provider. You may need to reschedule your appointment if you arrive late (15 or more minutes).  Arriving late affects you and other patients whose appointments are after yours.  Also, if you miss three or more appointments without notifying the office, you may be dismissed from the clinic at the provider's discretion.      For prescription refill requests, have your pharmacy contact our office and allow 72 hours for refills to be completed.    Today you received the following chemotherapy and/or immunotherapy agents Decitibine infusion      To help prevent nausea and vomiting after your treatment, we encourage you to take your nausea medication as directed.  BELOW ARE SYMPTOMS THAT SHOULD BE REPORTED IMMEDIATELY: *FEVER GREATER THAN 100.4 F (38 C) OR HIGHER *CHILLS OR SWEATING *NAUSEA AND VOMITING THAT IS NOT CONTROLLED WITH YOUR NAUSEA MEDICATION *UNUSUAL SHORTNESS OF BREATH *UNUSUAL BRUISING OR BLEEDING *URINARY PROBLEMS (pain or burning when urinating, or frequent urination) *BOWEL PROBLEMS (unusual diarrhea, constipation, pain near the anus) TENDERNESS IN MOUTH AND THROAT WITH OR WITHOUT PRESENCE OF ULCERS (sore throat, sores in mouth, or a toothache) UNUSUAL RASH, SWELLING OR PAIN  UNUSUAL VAGINAL DISCHARGE OR ITCHING   Items with * indicate a potential emergency and should be followed up as soon as possible or go to the Emergency Department if any problems should occur.  Please show the CHEMOTHERAPY ALERT CARD or IMMUNOTHERAPY ALERT CARD at check-in to the  Emergency Department and triage nurse.  Should you have questions after your visit or need to cancel or reschedule your appointment, please contact [facility] [phone]  and follow the prompts.  Office hours are 8:00 a.m. to 4:30 p.m. Monday - Friday. Please note that voicemails left after 4:00 p.m. may not be returned until the following business day.  We are closed weekends and major holidays. You have access to a nurse at all times for urgent questions. Please call the main number to the clinic [phone] and follow the prompts.  For any non-urgent questions, you may also contact your provider using MyChart. We now offer e-Visits for anyone 82 and older to request care online for non-urgent symptoms. For details visit [email or web address].   Also download the MyChart app! Go to the app store, search "MyChart", open the app, select [facility], and log in with your MyChart username and password.  Due to Covid, a mask is required upon entering the hospital/clinic. If you do not have a mask, one will be given to you upon arrival. For doctor visits, patients may have 1 support person aged 51 or older with them. For treatment visits, patients cannot have anyone with them due to current Covid guidelines and our immunocompromised population.

## 2020-11-05 ENCOUNTER — Other Ambulatory Visit: Payer: Self-pay

## 2020-11-05 ENCOUNTER — Inpatient Hospital Stay (HOSPITAL_COMMUNITY): Payer: Medicare Other | Attending: Hematology

## 2020-11-05 VITALS — BP 146/66 | HR 56 | Temp 97.8°F | Resp 16

## 2020-11-05 DIAGNOSIS — Z5111 Encounter for antineoplastic chemotherapy: Secondary | ICD-10-CM | POA: Diagnosis not present

## 2020-11-05 DIAGNOSIS — Z95828 Presence of other vascular implants and grafts: Secondary | ICD-10-CM

## 2020-11-05 DIAGNOSIS — D46Z Other myelodysplastic syndromes: Secondary | ICD-10-CM

## 2020-11-05 DIAGNOSIS — C9200 Acute myeloblastic leukemia, not having achieved remission: Secondary | ICD-10-CM | POA: Diagnosis present

## 2020-11-05 MED ORDER — SODIUM CHLORIDE 0.9 % IV SOLN: Freq: Once | INTRAVENOUS | Status: AC

## 2020-11-05 MED ORDER — SODIUM CHLORIDE 0.9 % IV SOLN
15.0000 mg/m2 | Freq: Once | INTRAVENOUS | Status: AC
  Administered 2020-11-05: 35 mg via INTRAVENOUS
  Filled 2020-11-05: qty 7

## 2020-11-05 MED ORDER — SODIUM CHLORIDE 0.9 % IV SOLN
8.0000 mg | Freq: Once | INTRAVENOUS | Status: AC
  Administered 2020-11-05: 8 mg via INTRAVENOUS
  Filled 2020-11-05: qty 8

## 2020-11-05 MED ORDER — HEPARIN SOD (PORK) LOCK FLUSH 100 UNIT/ML IV SOLN
500.0000 [IU] | Freq: Once | INTRAVENOUS | Status: AC | PRN
  Administered 2020-11-05: 500 [IU]

## 2020-11-05 MED ORDER — SODIUM CHLORIDE 0.9% FLUSH
10.0000 mL | INTRAVENOUS | Status: DC | PRN
  Administered 2020-11-05: 10 mL

## 2020-11-05 NOTE — Patient Instructions (Signed)
[facility]  Discharge Instructions: Thank you for choosing [facility] to provide your oncology and hematology care.  If you have a lab appointment with the Cancer Center, please come in thru the Main Entrance and check in at the main information desk.  Wear comfortable clothing and clothing appropriate for easy access to any Portacath or PICC line.   We strive to give you quality time with your provider. You may need to reschedule your appointment if you arrive late (15 or more minutes).  Arriving late affects you and other patients whose appointments are after yours.  Also, if you miss three or more appointments without notifying the office, you may be dismissed from the clinic at the provider's discretion.      For prescription refill requests, have your pharmacy contact our office and allow 72 hours for refills to be completed.        To help prevent nausea and vomiting after your treatment, we encourage you to take your nausea medication as directed.  BELOW ARE SYMPTOMS THAT SHOULD BE REPORTED IMMEDIATELY: *FEVER GREATER THAN 100.4 F (38 C) OR HIGHER *CHILLS OR SWEATING *NAUSEA AND VOMITING THAT IS NOT CONTROLLED WITH YOUR NAUSEA MEDICATION *UNUSUAL SHORTNESS OF BREATH *UNUSUAL BRUISING OR BLEEDING *URINARY PROBLEMS (pain or burning when urinating, or frequent urination) *BOWEL PROBLEMS (unusual diarrhea, constipation, pain near the anus) TENDERNESS IN MOUTH AND THROAT WITH OR WITHOUT PRESENCE OF ULCERS (sore throat, sores in mouth, or a toothache) UNUSUAL RASH, SWELLING OR PAIN  UNUSUAL VAGINAL DISCHARGE OR ITCHING   Items with * indicate a potential emergency and should be followed up as soon as possible or go to the Emergency Department if any problems should occur.  Please show the CHEMOTHERAPY ALERT CARD or IMMUNOTHERAPY ALERT CARD at check-in to the Emergency Department and triage nurse.  Should you have questions after your visit or need to cancel  or reschedule your appointment, please contact [facility] [phone]  and follow the prompts.  Office hours are 8:00 a.m. to 4:30 p.m. Monday - Friday. Please note that voicemails left after 4:00 p.m. may not be returned until the following business day.  We are closed weekends and major holidays. You have access to a nurse at all times for urgent questions. Please call the main number to the clinic [phone] and follow the prompts.  For any non-urgent questions, you may also contact your provider using MyChart. We now offer e-Visits for anyone 18 and older to request care online for non-urgent symptoms. For details visit [email or web address].   Also download the MyChart app! Go to the app store, search "MyChart", open the app, select [facility], and log in with your MyChart username and password.  Due to Covid, a mask is required upon entering the hospital/clinic. If you do not have a mask, one will be given to you upon arrival. For doctor visits, patients may have 1 support person aged 18 or older with them. For treatment visits, patients cannot have anyone with them due to current Covid guidelines and our immunocompromised population.  

## 2020-11-05 NOTE — Progress Notes (Signed)
Patient presents today for treatment per orders.  Patient tolerated treatment well with no complaints voiced.  Patient left ambulatory in stable condition.  Vital signs stable at discharge.  Follow up as scheduled.     Patients port flushed without difficulty.  Good blood return noted with no bruising or swelling noted at site.  Band aid applied.  VSS with discharge and left in satisfactory condition with no s/s of distress noted.

## 2020-11-09 ENCOUNTER — Other Ambulatory Visit: Payer: Self-pay | Admitting: Oncology

## 2020-11-15 ENCOUNTER — Other Ambulatory Visit (HOSPITAL_COMMUNITY): Payer: Self-pay

## 2020-11-15 ENCOUNTER — Other Ambulatory Visit (HOSPITAL_COMMUNITY): Payer: Self-pay | Admitting: Physician Assistant

## 2020-11-15 ENCOUNTER — Encounter (HOSPITAL_COMMUNITY): Payer: Self-pay

## 2020-11-15 MED ORDER — ALPRAZOLAM 0.25 MG PO TABS: 0.2500 mg | ORAL_TABLET | Freq: Two times a day (BID) | ORAL | 0 refills | Status: DC | PRN

## 2020-11-15 NOTE — Progress Notes (Signed)
Patient's wife called reporting that Xanax 0.25mg  QHS is helping the patient sleep, but reports that it is not helping with daytime anxiety. Patient's wife is asking if he could take this medication twice daily as needed. Dr. Delton Coombes made aware and agreeable to Xanax 0.25 BID PRN. Prescription refill request routed to Dr. Delton Coombes.

## 2020-11-19 ENCOUNTER — Other Ambulatory Visit (HOSPITAL_COMMUNITY): Payer: Self-pay

## 2020-11-23 ENCOUNTER — Other Ambulatory Visit (HOSPITAL_COMMUNITY): Payer: Self-pay

## 2020-11-23 ENCOUNTER — Other Ambulatory Visit (HOSPITAL_COMMUNITY): Payer: Self-pay | Admitting: Hematology

## 2020-11-23 MED ORDER — HYDROCODONE-ACETAMINOPHEN 10-325 MG PO TABS: 1.0000 | ORAL_TABLET | Freq: Four times a day (QID) | ORAL | 0 refills | Status: DC | PRN

## 2020-11-26 ENCOUNTER — Inpatient Hospital Stay (HOSPITAL_BASED_OUTPATIENT_CLINIC_OR_DEPARTMENT_OTHER): Payer: Medicare Other | Admitting: Physician Assistant

## 2020-11-26 ENCOUNTER — Other Ambulatory Visit (HOSPITAL_COMMUNITY): Payer: Self-pay | Admitting: Hematology

## 2020-11-26 ENCOUNTER — Ambulatory Visit (HOSPITAL_COMMUNITY)
Admission: RE | Admit: 2020-11-26 | Discharge: 2020-11-26 | Disposition: A | Discharge status: Home / Self Care | Payer: Medicare Other | Source: Ambulatory Visit | Attending: Hematology | Admitting: Hematology

## 2020-11-26 ENCOUNTER — Inpatient Hospital Stay (HOSPITAL_COMMUNITY): Payer: Medicare Other

## 2020-11-26 ENCOUNTER — Other Ambulatory Visit (HOSPITAL_COMMUNITY): Payer: Self-pay

## 2020-11-26 ENCOUNTER — Other Ambulatory Visit: Payer: Self-pay

## 2020-11-26 ENCOUNTER — Encounter (HOSPITAL_COMMUNITY): Payer: Self-pay | Admitting: *Deleted

## 2020-11-26 ENCOUNTER — Other Ambulatory Visit (HOSPITAL_COMMUNITY): Payer: Self-pay | Admitting: *Deleted

## 2020-11-26 VITALS — BP 137/71 | HR 79 | Temp 98.2°F | Resp 16

## 2020-11-26 DIAGNOSIS — C9200 Acute myeloblastic leukemia, not having achieved remission: Secondary | ICD-10-CM

## 2020-11-26 DIAGNOSIS — D46Z Other myelodysplastic syndromes: Secondary | ICD-10-CM | POA: Insufficient documentation

## 2020-11-26 DIAGNOSIS — R5383 Other fatigue: Secondary | ICD-10-CM | POA: Insufficient documentation

## 2020-11-26 DIAGNOSIS — R6883 Chills (without fever): Secondary | ICD-10-CM

## 2020-11-26 DIAGNOSIS — R509 Fever, unspecified: Secondary | ICD-10-CM

## 2020-11-26 LAB — URINALYSIS, ROUTINE W REFLEX MICROSCOPIC
Bacteria, UA: NONE SEEN
Bilirubin Urine: NEGATIVE
Glucose, UA: NEGATIVE mg/dL
Hgb urine dipstick: NEGATIVE
Ketones, ur: NEGATIVE mg/dL
Nitrite: NEGATIVE
Protein, ur: NEGATIVE mg/dL
Specific Gravity, Urine: 1.027 (ref 1.005–1.030)
pH: 5 (ref 5.0–8.0)

## 2020-11-26 LAB — CBC WITH DIFFERENTIAL/PLATELET
Abs Immature Granulocytes: 0.03 10*3/uL (ref 0.00–0.07)
Basophils Absolute: 0 10*3/uL (ref 0.0–0.1)
Basophils Relative: 1 %
Eosinophils Absolute: 0 10*3/uL (ref 0.0–0.5)
Eosinophils Relative: 0 %
HCT: 45.6 % (ref 39.0–52.0)
Hemoglobin: 14.8 g/dL (ref 13.0–17.0)
Immature Granulocytes: 1 %
Lymphocytes Relative: 17 %
Lymphs Abs: 1 10*3/uL (ref 0.7–4.0)
MCH: 32 pg (ref 26.0–34.0)
MCHC: 32.5 g/dL (ref 30.0–36.0)
MCV: 98.7 fL (ref 80.0–100.0)
Monocytes Absolute: 0.7 10*3/uL (ref 0.1–1.0)
Monocytes Relative: 11 %
Neutro Abs: 4.4 10*3/uL (ref 1.7–7.7)
Neutrophils Relative %: 70 %
Platelets: 156 10*3/uL (ref 150–400)
RBC: 4.62 MIL/uL (ref 4.22–5.81)
RDW: 15.3 % (ref 11.5–15.5)
WBC: 6.2 10*3/uL (ref 4.0–10.5)
nRBC: 0 % (ref 0.0–0.2)

## 2020-11-26 LAB — COMPREHENSIVE METABOLIC PANEL
ALT: 28 U/L (ref 0–44)
AST: 26 U/L (ref 15–41)
Albumin: 3.8 g/dL (ref 3.5–5.0)
Alkaline Phosphatase: 72 U/L (ref 38–126)
Anion gap: 6 (ref 5–15)
BUN: 18 mg/dL (ref 8–23)
CO2: 28 mmol/L (ref 22–32)
Calcium: 9.3 mg/dL (ref 8.9–10.3)
Chloride: 107 mmol/L (ref 98–111)
Creatinine, Ser: 0.9 mg/dL (ref 0.61–1.24)
GFR, Estimated: >60 mL/min (ref >60)
Glucose, Bld: 103 mg/dL — ABNORMAL HIGH (ref 70–99)
Potassium: 4.3 mmol/L (ref 3.5–5.1)
Sodium: 141 mmol/L (ref 135–145)
Total Bilirubin: 0.5 mg/dL (ref 0.3–1.2)
Total Protein: 6.3 g/dL — ABNORMAL LOW (ref 6.5–8.1)

## 2020-11-26 LAB — SAMPLE TO BLOOD BANK

## 2020-11-26 LAB — MAGNESIUM: Magnesium: 2 mg/dL (ref 1.7–2.4)

## 2020-11-26 MED ORDER — VENETOCLAX 100 MG PO TABS
ORAL_TABLET | ORAL | 0 refills | Status: DC
  Filled 2020-11-26: qty 60, 30d supply, fill #0

## 2020-11-26 NOTE — Telephone Encounter (Signed)
Per last office note, okay to refill venetoclax.

## 2020-11-26 NOTE — Progress Notes (Signed)
[facility] [place] [address], [place] [address] Phone: [phone] Fax: [facility] PROGRESS NOTE   Richard Wallace [id number] [birth year] 75 y.o.  Richard Wallace is managed by Dr. 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  Actively treated with chemotherapy/immunotherapy/hormonal therapy: yes  Current therapy: Decitabine (days 1 through 5) every 6 weeks and venetoclax 200 mg (weeks 2-3 of every 6-week cycle)  Last treated: Decitabine 11/01/2020 through 11/05/2020 (cycle #16)  Next scheduled appointment with provider: 12/13/2020  Subjective:  Chief Complaint: Generalized weakness and fatigue  Richard Wallace is a pleasant 75 year old male who is accompanied by his wife today.  He sees Dr. 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  He is on dual therapy with decitabine and venetoclax, as above.  Patient reports that he has been severely fatigued over the past week.  Per his wife, there was a day this week where he slept for about 30 hours.  No fever, but patient had T-max at home 98.6 F, and reports that he usually only runs at about 97.0 F.  Patient denies shaking chills, but has had some cold intolerance - has required extra blankets and has requested that the home thermostat be turned up higher than it usually is.  Patient and his wife report that he has never been this weak or fatigued before.  Reports that he is just "wiped out and exhausted."  Patient denies any other associated symptoms.  No fever, chills, night sweats, dysuria, urinary urgency/frequency, cough, chest pain, shortness of breath, dyspnea on exertion, nausea, vomiting, diarrhea.  He reports that he has been eating and drinking his normal amount.  Reports that his weight is stable.  They do report that they have been "going full steam" for the past 3 months, as they have been working on moving from [place] to [place].  Patient reports that he probably "does not know his limits," and has a tendency to overdo things.   Review of Systems:  Review of Systems  Constitutional:  Positive for activity change and fatigue. Negative for appetite change, chills, diaphoresis, fever and unexpected weight change.  HENT:  Negative for sinus pressure, sneezing and sore throat.   Respiratory:  Negative for cough, chest tightness and shortness of breath.   Cardiovascular:  Negative for chest pain.  Gastrointestinal:  Negative for abdominal pain, constipation, diarrhea, nausea and vomiting.  Endocrine: Positive for cold intolerance.  Genitourinary:  Negative for dysuria, frequency and urgency.  Neurological:  Negative for dizziness and headaches.    Past Medical History, Surgical history, Social history, and Family history were reviewed as documented elsewhere in chart, and were updated as appropriate.   Objective:   Physical Exam:  BP 137/71 (BP Location: Left Arm, Patient Position: Sitting)   Pulse 79   Temp 98.2 F (36.8 C) (Oral)   Resp 16   SpO2 96%  ECOG: 2  Physical Exam Vitals reviewed: Somewhat weak-appearing, presents in electric wheelchair.  Constitutional:      Appearance: Normal appearance. He is obese.  HENT:     Head: Normocephalic and atraumatic.     Mouth/Throat:     Mouth: Mucous membranes are moist.  Eyes:     Extraocular Movements: Extraocular movements intact.     Pupils: Pupils are equal, round, and reactive to light.  Cardiovascular:     Rate and Rhythm: Normal rate and regular rhythm.     Pulses:  Normal pulses.     Heart sounds: Normal heart sounds.  Pulmonary:     Effort: Pulmonary effort is normal.     Breath sounds: Normal breath sounds.  Abdominal:     General: Bowel sounds are normal.     Palpations: Abdomen is soft.     Tenderness: There is no abdominal tenderness.  Musculoskeletal:        General: No swelling.     Right lower leg: No edema.      Left lower leg: No edema.  Lymphadenopathy:     Cervical: No cervical adenopathy.  Skin:    General: Skin is warm and dry.  Neurological:     General: No focal deficit present.     Mental Status: He is alert and oriented to person, place, and time.  Psychiatric:        Mood and Affect: Mood normal.        Behavior: Behavior normal.    Lab Review:     Component Value Date/Time   NA 141 11/26/2020 1249   K 4.3 11/26/2020 1249   CL 107 11/26/2020 1249   CO2 28 11/26/2020 1249   GLUCOSE 103 (H) 11/26/2020 1249   BUN 18 11/26/2020 1249   CREATININE 0.90 11/26/2020 1249   CALCIUM 9.3 11/26/2020 1249   PROT 6.3 (L) 11/26/2020 1249   ALBUMIN 3.8 11/26/2020 1249   AST 26 11/26/2020 1249   ALT 28 11/26/2020 1249   ALKPHOS 72 11/26/2020 1249   BILITOT 0.5 11/26/2020 1249   GFRNONAA >60 11/26/2020 1249   GFRAA >60 02/09/2020 1019       Component Value Date/Time   WBC 6.2 11/26/2020 1249   RBC 4.62 11/26/2020 1249   HGB 14.8 11/26/2020 1249   HCT 45.6 11/26/2020 1249   PLT 156 11/26/2020 1249   MCV 98.7 11/26/2020 1249   MCH 32.0 11/26/2020 1249   MCHC 32.5 11/26/2020 1249   RDW 15.3 11/26/2020 1249   LYMPHSABS 1.0 11/26/2020 1249   MONOABS 0.7 11/26/2020 1249   EOSABS 0.0 11/26/2020 1249   BASOSABS 0.0 11/26/2020 1249   -------------------------------  Imaging from last 24 hours (if applicable):  Radiology interpretation: No results found.    Assessment & Plan:    1.  Generalized weakness and fatigue - Laboratory work-up was unremarkable, normal CBC and CMP - Urinalysis nondiagnostic for UTI - CXR images reviewed by me, no acute cardiopulmonary disease per my read (official radiology read pending) - Vital signs stable; no tachycardia, fever, or hypotension - PLAN: In the absence of other signs of acute disease, suspect that patient's extreme fatigue and exhaustion is related to him a pushing himself too hard over the past several months, while working on moving to  a new house.  Explained to the patient that he needs to rest at home and focus on maintaining adequate nutrition and hydration before his next cycle of chemotherapy.  Patient instructed to call the clinic if he has any new symptoms or worsening symptoms.  2.  High-grade MDS with progression to AML - Patient has appointment to see Dr. Delton Coombes on 12/13/2020 for cycle #17 of decitabine and venetoclax - PLAN: Continue follow-up with Dr. Raliegh Ip as previously scheduled for 12/13/2020   All questions were answered. The patient knows to call the clinic with any problems, questions or concerns.  Medical decision making: Low  Time spent on visit: I spent 20 minutes counseling the patient face to face. The total time spent in the appointment was  30 minutes and more than 50% was on counseling.   Harriett Rush, PA-C  11/26/2020 3:37 PM

## 2020-11-26 NOTE — Progress Notes (Signed)
Patient's wife left a VM yesterday evening. I returned call this morning and wife tells me that Monday of this week, Richard Wallace had flu-like symptoms.  He was feverish, chills, body aches.  She said it was only Monday and he has been fine since that time except he has been sleeping a lot.  She said that he is fatigued and doesn't have much energy.  I have spoke with Tarri Abernethy, PA and she will see patient today.  She gave orders for labs, urinalysis, chest xray.  Patient's wife is aware of appt time and plan of care today.

## 2020-11-26 NOTE — Patient Instructions (Signed)
[facility] at [facility] Discharge Instructions  You were seen today by Tarri Abernethy PA-C for your weakness and fatigue.  Your lab work and chest x-ray so far appears to be normal.  There are no obvious causes of your symptoms of fatigue and weakness.  I recommend that you continue to rest at home, and focus on drinking plenty of water and eating good healthy food (with a reasonable amount of sweets as well).  If you experience any new or worsening symptoms, please call our office.  If you experience any severe symptoms, please proceed directly to the emergency department.  Otherwise, keep your appointment with Dr. Delton Coombes on 12/13/2020 for follow-up visit and next treatment of chemotherapy.  -----------------------------------------------------------------------  Thank you for choosing [facility] at [facility] to provide your oncology and hematology care.  To afford each patient quality time with our provider, please arrive at least 15 minutes before your scheduled appointment time.   If you have a lab appointment with the [facility] please come in thru the Main Entrance and check in at the main information desk.  You need to re-schedule your appointment should you arrive 10 or more minutes late.  We strive to give you quality time with our providers, and arriving late affects you and other patients whose appointments are after yours.  Also, if you no show three or more times for appointments you may be dismissed from the clinic at the providers discretion.     Again, thank you for choosing [facility].  Our hope is that these requests will decrease the amount of time that you wait before being seen by our physicians.       _____________________________________________________________  Should you have questions after your visit to [facility], please contact our office at [phone] and follow the prompts.   Our office hours are 8:00 a.m. and 4:30 p.m. Monday - Friday.  Please note that voicemails left after 4:00 p.m. may not be returned until the following business day.  We are closed weekends and major holidays.  You do have access to a nurse 24-7, just call the main number to the clinic [phone] and do not press any options, hold on the line and a nurse will answer the phone.    For prescription refill requests, have your pharmacy contact our office and allow 72 hours.    Due to Covid, you will need to wear a mask upon entering the hospital. If you do not have a mask, a mask will be given to you at the Main Entrance upon arrival. For doctor visits, patients may have 1 support person age 23 or older with them. For treatment visits, patients can not have anyone with them due to social distancing guidelines and our immunocompromised population.

## 2020-12-01 ENCOUNTER — Other Ambulatory Visit (HOSPITAL_COMMUNITY): Payer: Self-pay | Admitting: Hematology

## 2020-12-01 DIAGNOSIS — C9200 Acute myeloblastic leukemia, not having achieved remission: Secondary | ICD-10-CM

## 2020-12-01 DIAGNOSIS — D46Z Other myelodysplastic syndromes: Secondary | ICD-10-CM

## 2020-12-02 ENCOUNTER — Other Ambulatory Visit (HOSPITAL_COMMUNITY): Payer: Self-pay

## 2020-12-10 ENCOUNTER — Other Ambulatory Visit (HOSPITAL_COMMUNITY): Payer: Self-pay

## 2020-12-10 DIAGNOSIS — D46Z Other myelodysplastic syndromes: Secondary | ICD-10-CM

## 2020-12-11 NOTE — Progress Notes (Signed)
[place], [place] [address]   CLINIC:  Medical Oncology/Hematology  PCP:  [name], MD [address] [place] [address] [phone]   REASON FOR VISIT:  Follow-up for AML  PRIOR THERAPY: none  NGS Results: not done  CURRENT THERAPY: Decitabine every 6 weeks & venetoclax 200 mg x2 weeks after chemo  BRIEF ONCOLOGIC HISTORY:  Oncology History  MDS (myelodysplastic syndrome), high grade ([facility])  08/14/2018 Initial Diagnosis   MDS (myelodysplastic syndrome), high grade ([facility])    08/22/2018 - 12/06/2018 Chemotherapy   The patient had palonosetron (ALOXI) injection 0.25 mg, 0.25 mg, Intravenous,  Once, 4 of 6 cycles Administration: 0.25 mg (08/22/2018), 0.25 mg (08/26/2018), 0.25 mg (08/28/2018), 0.25 mg (08/30/2018), 0.25 mg (10/01/2018), 0.25 mg (10/02/2018), 0.25 mg (11/04/2018), 0.25 mg (09/23/2018), 0.25 mg (09/25/2018), 0.25 mg (09/27/2018), 0.25 mg (10/28/2018), 0.25 mg (10/30/2018), 0.25 mg (11/01/2018), 0.25 mg (12/02/2018), 0.25 mg (12/04/2018), 0.25 mg (12/06/2018) azaCITIDine (VIDAZA) 100 mg in sodium chloride 0.9 % 50 mL chemo infusion, 110 mg (66.7 % of original dose 75 mg/m2), Intravenous, Once, 4 of 6 cycles Dose modification: 50 mg/m2 (original dose 75 mg/m2, Cycle 1, Reason: Provider Judgment), 50 mg/m2 (original dose 75 mg/m2, Cycle 2, Reason: Provider Judgment) Administration: 100 mg (08/22/2018), 100 mg (08/23/2018), 100 mg (08/26/2018), 100 mg (08/27/2018), 100 mg (08/28/2018), 100 mg (08/29/2018), 100 mg (08/30/2018), 100 mg (10/01/2018), 100 mg (10/02/2018), 100 mg (11/04/2018), 100 mg (11/05/2018), 100 mg (09/23/2018), 100 mg (09/24/2018), 100 mg (09/25/2018), 100 mg (09/26/2018), 100 mg (09/27/2018), 100 mg (10/28/2018), 100 mg (10/29/2018), 100 mg (10/30/2018), 100 mg (10/31/2018), 100 mg (11/01/2018), 100 mg (12/02/2018), 100 mg (12/03/2018), 100 mg (12/04/2018), 100 mg (12/05/2018), 100 mg (12/06/2018)   for chemotherapy treatment.     01/20/2019 -   Chemotherapy    Patient is on Treatment Plan: MYELODYSPLASIA DECITABINE D1-5 Q42D       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[facility])  01/07/2019 Initial Diagnosis   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[facility])    01/20/2019 -  Chemotherapy    Patient is on Treatment Plan: MYELODYSPLASIA DECITABINE D1-5 Q42D         CANCER STAGING: Cancer Staging No matching staging information was found for the patient.  INTERVAL HISTORY:  [name], a 75 y.o. male, returns for routine follow-up and consideration for next cycle of chemotherapy. Richard Wallace was last seen on 09/20/20.  Due for cycle #17 of Decitabine today.   Overall, he tells me he has been feeling pretty well. He reports elevated levels of fatigue and a recent viral infection accopmanied by chills that lasted 2 days the week of 07/22. He denies n/v/d.   Overall, he feels ready for next cycle of chemo today.   REVIEW OF SYSTEMS:  Review of Systems  Constitutional:  Positive for fatigue (10%). Negative for appetite change (90%).  Gastrointestinal:  Negative for diarrhea, nausea and vomiting.  Musculoskeletal:  Positive for arthralgias (arthritis).  Neurological:  Positive for dizziness and numbness (toes).  All other systems reviewed and are negative.  PAST MEDICAL/SURGICAL HISTORY:  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   Cancer ([facility]) 01-28-2014   skin cancer   CKD (chronic kidney disease), stage III (HCC)    GERD (gastroesophageal reflux disease)    Heart murmur    NOTED DURING PHYSICAL WHEN HE WAS ENLISTING IN [facility] , DIDNT KNOW UNTIL THAT TIME  AND REPORTS , "THATS THE LAST I HEARD ABOUT IT "    History of hypertension    no longer issue   History of kidney stones    History of malignant melanoma of skin    excision top of scalp 2015-- no recurrence   History of urinary retention    post op lumbar fusion surgery 04/ 2016   Hypertension    Kidney  dysfunction    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, MONITORED BY [facility] DR Annie Main DAHLSTEDT    Left ureteral calculus    Seasonal allergies    Wears glasses    Wears glasses    Wears partial dentures    upper and lower   Past Surgical History:  Procedure Laterality Date   ANKLE FUSION Right 2007   CARPAL TUNNEL RELEASE Left 12/28/2009   w/ pulley release left long finger   CARPAL TUNNEL RELEASE Right 07/22/2013   Procedure: RIGHT CARPAL TUNNEL RELEASE;  Surgeon: Cammie Sickle., MD;  Location: [facility];  Service: Orthopedics;  Laterality: Right;   COLONOSCOPY     CYSTO/  LEFT RETROGRADE PYELOGRAM  11/21/2010   CYSTOSCOPY WITH STENT PLACEMENT Left 03/09/2016   Procedure: CYSTOSCOPY WITH STENT PLACEMENT;  Surgeon: Franchot Gallo, MD;  Location: [facility];  Service: Urology;  Laterality: Left;   CYSTOSCOPY/RETROGRADE/URETEROSCOPY/STONE EXTRACTION WITH BASKET Left 03/09/2016   Procedure: CYSTOSCOPY/RETROGRADE/URETEROSCOPY/STONE EXTRACTION WITH BASKET;  Surgeon: Franchot Gallo, MD;  Location[facility];  Service: Urology;  Laterality: Left;   LEFT URETEROSCOPIC LASER LITHOTRIPSY STONE EXTRACTION/ STENT PLACEMENT  05/23/2010   MOHS SURGERY     TOP OF THE HEAD    ORIF ANKLE FRACTURE Right 1978   PORT-A-CATH REMOVAL Right 02/14/2019   Procedure: MINOR REMOVAL PORT-A-CATH;  Surgeon: Aviva Signs, MD;  Location: [facility];  Service: General;  Laterality: Right;   PORTACATH PLACEMENT Right 08/19/2018   Procedure: INSERTION PORT-A-CATH (attached catheter in right subclavian);  Surgeon: Aviva Signs, MD;  Location: [facility];  Service: General;  Laterality: Right;   PORTACATH PLACEMENT Left 05/16/2019   Procedure: INSERTION PORT-A-CATH (attached catheter in left subclavian);  Surgeon: Aviva Signs, MD;  Location: [facility];  Service: General;  Laterality: Left;   POSTERIOR LUMBAR FUSION  08/21/2014   laminectomy and decompression L2 -- L5    RIGHT LOWER LEG SURGERY  X3  1975 to 1976   including ORIF   TONSILLECTOMY AND ADENOIDECTOMY  [id number]   UMBILICAL HERNIA REPAIR  2009 approx    SOCIAL HISTORY:  Social History   Socioeconomic History   Marital status: Married    Spouse name: Not on file   Number of children: Not on file   Years of education: Not on file   Highest education level: Not on file  Occupational History   Not on file  Tobacco Use   Smoking status: Former    Years: 20.00    Types: Cigarettes    Quit date: 07/17/1986    Years since quitting: 34.4   Smokeless tobacco: Never  Vaping Use   Vaping Use: Never used  Substance and Sexual Activity   Alcohol use: Yes    Alcohol/week: 7.0 - 14.0 standard drinks    Types: 7 - 14 Cans of beer per week    Comment: 1 -2 beer daily   Drug use: No   Sexual activity: Not Currently  Other Topics Concern   Not on file  Social History Narrative   Not on file  Social Determinants of Health   Financial Resource Strain: Low Risk    Difficulty of Paying Living Expenses: Not hard at all  Food Insecurity: No Food Insecurity   Worried About Charity fundraiser in the Last Year: Never true   [place] in the Last Year: Never true  Transportation Needs: No Transportation Needs   Lack of Transportation (Medical): No   Lack of Transportation (Non-Medical): No  Physical Activity: Not on file  Stress: No Stress Concern Present   Feeling of Stress : Not at all  Social Connections: Moderately Isolated   Frequency of Communication with Friends and Family: Twice a week   Frequency of Social Gatherings with Friends and Family: Never   Attends Religious Services: Never   Marine scientist or Organizations: Yes   Attends Music therapist: More than 4 times per year   Marital Status: Married  Human resources officer Violence: Not on file    FAMILY HISTORY:  Family History  Problem Relation Age of Onset   Stroke Mother    Prostate cancer Father    Bone  cancer Father    Diverticulitis Father    Rheum arthritis Sister    Urinary tract infection Sister    Colon cancer Neg Hx     CURRENT MEDICATIONS:  Current Outpatient Medications  Medication Sig Dispense Refill   allopurinol (ZYLOPRIM) 300 MG tablet TAKE 1 TABLET BY MOUTH AT BEDTIME 90 tablet 2   ALPRAZolam (XANAX) 0.25 MG tablet Take 1 tablet (0.25 mg total) by mouth 2 (two) times daily as needed for anxiety. 60 tablet 0   amLODipine (NORVASC) 2.5 MG tablet Take 1 tablet (2.5 mg total) by mouth daily. 90 tablet 2   cetirizine (ZYRTEC) 10 MG tablet Take 10 mg by mouth daily. IN THE MORNING     diclofenac Sodium (VOLTAREN) 1 % GEL Apply three times daily to knees as needed for pain 50 g 3   docusate sodium (COLACE) 100 MG capsule Take 100 mg by mouth at bedtime.      donepezil (ARICEPT) 10 MG tablet TAKE 1 TABLET BY MOUTH AT BEDTIME 30 tablet 6   HYDROcodone-acetaminophen (NORCO) 10-325 MG tablet Take 1 tablet by mouth every 6 (six) hours as needed. 112 tablet 0   ipratropium (ATROVENT) 0.03 % nasal spray Place 2 sprays into both nostrils 3 (three) times daily.     lansoprazole (PREVACID) 15 MG capsule Take 15 mg by mouth daily.      Lidocaine 1.8 % PTCH Apply topically.     lidocaine-prilocaine (EMLA) cream Apply 1 application topically See admin instructions. ONE HOUR PRIOR TO CHEMOTHERAPY APPOINTMENT     magnesium oxide (MAG-OX) 400 (240 Mg) MG tablet Take 1 tablet by mouth 3 (three) times daily.     magnesium oxide (MAG-OX) 400 (241.3 Mg) MG tablet Take 1 tablet (400 mg total) by mouth 3 (three) times daily. (Patient taking differently: Take 1 tablet by mouth 4 (four) times daily.) 90 tablet 3   prochlorperazine (COMPAZINE) 10 MG tablet Take 1 tablet (10 mg total) by mouth every 6 (six) hours as needed for nausea or vomiting. 30 tablet 0   SF 5000 PLUS 1.1 % CREA dental cream SMARTSIG:Sparingly By Mouth Every Night     tamsulosin (FLOMAX) 0.4 MG CAPS capsule Take 1 capsule (0.4 mg total)  by mouth every evening. 30 capsule 2   venetoclax (VENCLEXTA) 100 MG tablet TAKE 2 TABLETS (200 MG) BY MOUTH DAILY 60 tablet  0   No current facility-administered medications for this visit.   Facility-Administered Medications Ordered in Other Visits  Medication Dose Route Frequency Provider Last Rate Last Admin   sodium chloride flush (NS) 0.9 % injection 10 mL  10 mL Intracatheter PRN [name], MD   10 mL at 11/21/19 1030   sodium chloride flush (NS) 0.9 % injection 20 mL  20 mL Intravenous PRN [name], MD   20 mL at 07/21/19 1053    ALLERGIES:  No Known Allergies  PHYSICAL EXAM:  Performance status (ECOG): 1 - Symptomatic but completely ambulatory  There were no vitals filed for this visit. Wt Readings from Last 3 Encounters:  11/01/20 217 lb (98.4 kg)  09/20/20 224 lb (101.6 kg)  08/09/20 226 lb (102.5 kg)   Physical Exam Vitals reviewed.  Constitutional:      Appearance: Normal appearance.  Cardiovascular:     Rate and Rhythm: Normal rate and regular rhythm.     Pulses: Normal pulses.     Heart sounds: Normal heart sounds.  Pulmonary:     Effort: Pulmonary effort is normal.     Breath sounds: Normal breath sounds.  Neurological:     General: No focal deficit present.     Mental Status: He is alert and oriented to person, place, and time.  Psychiatric:        Mood and Affect: Mood normal.        Behavior: Behavior normal.    LABORATORY DATA:  I have reviewed the labs as listed.  CBC Latest Ref Rng & Units 11/26/2020 11/01/2020 09/20/2020  WBC 4.0 - 10.5 K/uL 6.2 3.3(L) 3.5(L)  Hemoglobin 13.0 - 17.0 g/dL 14.8 14.7 14.3  Hematocrit 39.0 - 52.0 % 45.6 44.8 44.8  Platelets 150 - 400 K/uL 156 142(L) 120(L)   CMP Latest Ref Rng & Units 11/26/2020 11/01/2020 09/20/2020  Glucose 70 - 99 mg/dL 103(H) 119(H) 94  BUN 8 - 23 mg/dL '18 18 20  '$ Creatinine 0.61 - 1.24 mg/dL 0.90 0.93 1.04  Sodium 135 - 145 mmol/L 141 140 140  Potassium 3.5 - 5.1 mmol/L 4.3  4.1 4.4  Chloride 98 - 111 mmol/L 107 107 106  CO2 22 - 32 mmol/L '28 25 28  '$ Calcium 8.9 - 10.3 mg/dL 9.3 9.2 9.5  Total Protein 6.5 - 8.1 g/dL 6.3(L) 6.3(L) 6.3(L)  Total Bilirubin 0.3 - 1.2 mg/dL 0.5 0.8 0.7  Alkaline Phos 38 - 126 U/L 72 72 72  AST 15 - 41 U/L '26 27 22  '$ ALT 0 - 44 U/L '28 24 26    '$ DIAGNOSTIC IMAGING:  I have independently reviewed the scans and discussed with the patient. DG Chest 2 View  Result Date: 11/28/2020 CLINICAL DATA:  S fever, chills, fatigue EXAM: CHEST - 2 VIEW COMPARISON:  Prior chest x-ray 05/16/2019 FINDINGS: Left subclavian approach single-lumen power port. Catheter tip overlies the mid SVC. Cardiac and mediastinal contours are within normal limits in size. Chronic bronchitic changes are stable. No focal airspace infiltrate. No pleural effusion or pneumothorax. No acute osseous abnormality. Incompletely imaged lumbar stabilization hardware. IMPRESSION: No active cardiopulmonary disease. Electronically Signed   By: Jacqulynn Cadet M.D.   On: 11/28/2020 11:22     ASSESSMENT:  1.  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  -BMBX on 02/25/2019 after cycle 1 did not show any evidence of leukemia. -CT CAP on 02/12/2019 shows numerous bilateral  irregular/spiculated pulmonary nodules measuring up to 14 mm, nonspecific.  Hepatic steatosis.  Spleen is normal. -I have talked to Dr.Ravandi at [facility] per patient request.  There are clinical trials available upon progression of his AML.  There are also some clinical trials available now based on MRD positivity.  Patient not interested in moving to [place] at this time.   PLAN:  1.  AML: - He does not report any infections or hospitalizations in the last 6 weeks. - He does not have any GI side effects from decitabine or venetoclax. - Reviewed his labs today which showed normal LFTs.  LDH was 133.  CBC shows white count  slightly low at 3.6 and platelet count 142.  Differential was normal. - He reportedly had upper respiratory viral infection on 11/26/2020.  Chest x-ray at the time was negative. - Today he feels better.  We will proceed with decitabine (15 mg/m2) for 5 days. - He was told to start venetoclax 200 mg daily and take it for 2 weeks. - He complains of some fatigue.  I have recommended light exercises. - We will reevaluate him in 6 weeks.   2.  Arthralgias: - Continue hydrocodone 10/325 1 tablet every 6 hours as needed.   3.  Hypomagnesemia: - Continue magnesium 3 times daily.  Magnesium is normal.   4.  Hypertension: - Continue Norvasc 2.5 mg daily.  Systolic blood pressure is 118.   5.  CKD: - Creatinine is 0.93 and has improved since lisinopril on hold.   6.  Dementia: - Continue Aricept 10 mg daily.   Orders placed this encounter:  No orders of the defined types were placed in this encounter.    [name], MD [facility] [phone]   I, Thana Ates, am acting as a scribe for Dr. [name].  I, [name] MD, have reviewed the above documentation for accuracy and completeness, and I agree with the above.

## 2020-12-13 ENCOUNTER — Other Ambulatory Visit: Payer: Self-pay

## 2020-12-13 ENCOUNTER — Inpatient Hospital Stay (HOSPITAL_COMMUNITY): Payer: Medicare Other | Attending: Hematology

## 2020-12-13 ENCOUNTER — Inpatient Hospital Stay (HOSPITAL_BASED_OUTPATIENT_CLINIC_OR_DEPARTMENT_OTHER): Payer: Medicare Other | Admitting: Hematology

## 2020-12-13 ENCOUNTER — Inpatient Hospital Stay (HOSPITAL_COMMUNITY): Payer: Medicare Other

## 2020-12-13 VITALS — BP 118/70 | HR 60 | Temp 96.8°F | Resp 18 | Wt 217.7 lb

## 2020-12-13 VITALS — BP 141/82 | HR 64 | Temp 97.8°F | Resp 18

## 2020-12-13 DIAGNOSIS — I10 Essential (primary) hypertension: Secondary | ICD-10-CM | POA: Diagnosis not present

## 2020-12-13 DIAGNOSIS — M2550 Pain in unspecified joint: Secondary | ICD-10-CM | POA: Insufficient documentation

## 2020-12-13 DIAGNOSIS — C9200 Acute myeloblastic leukemia, not having achieved remission: Secondary | ICD-10-CM | POA: Diagnosis not present

## 2020-12-13 DIAGNOSIS — F0390 Unspecified dementia without behavioral disturbance: Secondary | ICD-10-CM | POA: Insufficient documentation

## 2020-12-13 DIAGNOSIS — Z79899 Other long term (current) drug therapy: Secondary | ICD-10-CM | POA: Diagnosis not present

## 2020-12-13 DIAGNOSIS — Z95828 Presence of other vascular implants and grafts: Secondary | ICD-10-CM

## 2020-12-13 DIAGNOSIS — D46Z Other myelodysplastic syndromes: Secondary | ICD-10-CM

## 2020-12-13 DIAGNOSIS — C9201 Acute myeloblastic leukemia, in remission: Secondary | ICD-10-CM

## 2020-12-13 DIAGNOSIS — N189 Chronic kidney disease, unspecified: Secondary | ICD-10-CM | POA: Diagnosis not present

## 2020-12-13 LAB — CBC WITH DIFFERENTIAL/PLATELET
Abs Immature Granulocytes: 0.04 10*3/uL (ref 0.00–0.07)
Basophils Absolute: 0 10*3/uL (ref 0.0–0.1)
Basophils Relative: 1 %
Eosinophils Absolute: 0.1 10*3/uL (ref 0.0–0.5)
Eosinophils Relative: 3 %
HCT: 43.8 % (ref 39.0–52.0)
Hemoglobin: 14.1 g/dL (ref 13.0–17.0)
Immature Granulocytes: 1 %
Lymphocytes Relative: 25 %
Lymphs Abs: 0.9 10*3/uL (ref 0.7–4.0)
MCH: 31.7 pg (ref 26.0–34.0)
MCHC: 32.2 g/dL (ref 30.0–36.0)
MCV: 98.4 fL (ref 80.0–100.0)
Monocytes Absolute: 0.3 10*3/uL (ref 0.1–1.0)
Monocytes Relative: 7 %
Neutro Abs: 2.2 10*3/uL (ref 1.7–7.7)
Neutrophils Relative %: 63 %
Platelets: 142 10*3/uL — ABNORMAL LOW (ref 150–400)
RBC: 4.45 MIL/uL (ref 4.22–5.81)
RDW: 14.9 % (ref 11.5–15.5)
WBC: 3.6 10*3/uL — ABNORMAL LOW (ref 4.0–10.5)
nRBC: 0 % (ref 0.0–0.2)

## 2020-12-13 LAB — COMPREHENSIVE METABOLIC PANEL
ALT: 25 U/L (ref 0–44)
AST: 23 U/L (ref 15–41)
Albumin: 3.6 g/dL (ref 3.5–5.0)
Alkaline Phosphatase: 70 U/L (ref 38–126)
Anion gap: 3 — ABNORMAL LOW (ref 5–15)
BUN: 19 mg/dL (ref 8–23)
CO2: 28 mmol/L (ref 22–32)
Calcium: 9.1 mg/dL (ref 8.9–10.3)
Chloride: 109 mmol/L (ref 98–111)
Creatinine, Ser: 0.93 mg/dL (ref 0.61–1.24)
GFR, Estimated: >60 mL/min (ref >60)
Glucose, Bld: 112 mg/dL — ABNORMAL HIGH (ref 70–99)
Potassium: 4.5 mmol/L (ref 3.5–5.1)
Sodium: 140 mmol/L (ref 135–145)
Total Bilirubin: 0.5 mg/dL (ref 0.3–1.2)
Total Protein: 5.9 g/dL — ABNORMAL LOW (ref 6.5–8.1)

## 2020-12-13 LAB — URIC ACID: Uric Acid, Serum: 3.8 mg/dL (ref 3.7–8.6)

## 2020-12-13 LAB — LACTATE DEHYDROGENASE: LDH: 133 U/L (ref 98–192)

## 2020-12-13 LAB — MAGNESIUM: Magnesium: 2 mg/dL (ref 1.7–2.4)

## 2020-12-13 LAB — PHOSPHORUS: Phosphorus: 3.3 mg/dL (ref 2.5–4.6)

## 2020-12-13 MED ORDER — SODIUM CHLORIDE 0.9 % IV SOLN
8.0000 mg | Freq: Once | INTRAVENOUS | Status: AC
  Administered 2020-12-13: 8 mg via INTRAVENOUS
  Filled 2020-12-13: qty 8

## 2020-12-13 MED ORDER — SODIUM CHLORIDE 0.9 % IV SOLN
15.0000 mg/m2 | Freq: Once | INTRAVENOUS | Status: AC
  Administered 2020-12-13: 35 mg via INTRAVENOUS
  Filled 2020-12-13: qty 7

## 2020-12-13 MED ORDER — SODIUM CHLORIDE 0.9% FLUSH
10.0000 mL | INTRAVENOUS | Status: DC | PRN
Start: 2020-12-13 — End: 2020-12-13
  Administered 2020-12-13: 10 mL

## 2020-12-13 MED ORDER — SODIUM CHLORIDE 0.9 % IV SOLN: Freq: Once | INTRAVENOUS | Status: AC

## 2020-12-13 MED ORDER — HEPARIN SOD (PORK) LOCK FLUSH 100 UNIT/ML IV SOLN
500.0000 [IU] | Freq: Once | INTRAVENOUS | Status: AC | PRN
  Administered 2020-12-13: 500 [IU]

## 2020-12-13 NOTE — Patient Instructions (Signed)
Richard Wallace  Discharge Instructions: Thank you for choosing [facility] to provide your oncology and hematology care.  If you have a lab appointment with the [facility], please come in thru the Main Entrance and check in at the main information desk.  Wear comfortable clothing and clothing appropriate for easy access to any Portacath or PICC line.   We strive to give you quality time with your provider. You may need to reschedule your appointment if you arrive late (15 or more minutes).  Arriving late affects you and other patients whose appointments are after yours.  Also, if you miss three or more appointments without notifying the office, you may be dismissed from the clinic at the provider's discretion.      For prescription refill requests, have your pharmacy contact our office and allow 72 hours for refills to be completed.    Today you received the following chemotherapy and/or immunotherapy agents; Decitabine. Return as scheduled.    To help prevent nausea and vomiting after your treatment, we encourage you to take your nausea medication as directed.  BELOW ARE SYMPTOMS THAT SHOULD BE REPORTED IMMEDIATELY: *FEVER GREATER THAN 100.4 F (38 C) OR HIGHER *CHILLS OR SWEATING *NAUSEA AND VOMITING THAT IS NOT CONTROLLED WITH YOUR NAUSEA MEDICATION *UNUSUAL SHORTNESS OF BREATH *UNUSUAL BRUISING OR BLEEDING *URINARY PROBLEMS (pain or burning when urinating, or frequent urination) *BOWEL PROBLEMS (unusual diarrhea, constipation, pain near the anus) TENDERNESS IN MOUTH AND THROAT WITH OR WITHOUT PRESENCE OF ULCERS (sore throat, sores in mouth, or a toothache) UNUSUAL RASH, SWELLING OR PAIN  UNUSUAL VAGINAL DISCHARGE OR ITCHING   Items with * indicate a potential emergency and should be followed up as soon as possible or go to the Emergency Department if any problems should occur.  Please show the CHEMOTHERAPY ALERT CARD or IMMUNOTHERAPY ALERT CARD at check-in  to the Emergency Department and triage nurse.  Should you have questions after your visit or need to cancel or reschedule your appointment, please contact [facility] [phone]  and follow the prompts.  Office hours are 8:00 a.m. to 4:30 p.m. Monday - Friday. Please note that voicemails left after 4:00 p.m. may not be returned until the following business day.  We are closed weekends and major holidays. You have access to a nurse at all times for urgent questions. Please call the main number to the clinic [phone] and follow the prompts.  For any non-urgent questions, you may also contact your provider using MyChart. We now offer e-Visits for anyone 76 and older to request care online for non-urgent symptoms. For details visit [email or web address].   Also download the MyChart app! Go to the app store, search "MyChart", open the app, select [facility], and log in with your MyChart username and password.  Due to Covid, a mask is required upon entering the hospital/clinic. If you do not have a mask, one will be given to you upon arrival. For doctor visits, patients may have 1 support person aged 33 or older with them. For treatment visits, patients cannot have anyone with them due to current Covid guidelines and our immunocompromised population.

## 2020-12-13 NOTE — Progress Notes (Signed)
Patient has been assessed, vital signs and labs have been reviewed by Dr. Katragadda. ANC, Creatinine, LFTs, and Platelets are within treatment parameters per Dr. Katragadda. The patient is good to proceed with treatment at this time. Primary RN and pharmacy aware.  

## 2020-12-13 NOTE — Progress Notes (Signed)
Patient tolerated chemotherapy with no complaints voiced. Side effects with management reviewed understanding verbalized. Port site clean and dry with no bruising or swelling noted at site. Good blood return noted before and after administration of chemotherapy. Band aid applied. Patient left in satisfactory condition with VSS and no s/s of distress noted. 

## 2020-12-13 NOTE — Patient Instructions (Addendum)
[facility] Discharge Instructions  You were seen today by Dr. Katragadda. He went over your recent results, and you received your treatment. Dr. Katragadda will see you back in 6 weeks for labs and follow up.   Thank you for choosing [facility] at [facility] to provide your oncology and hematology care.  To afford each patient quality time with our provider, please arrive at least 15 minutes before your scheduled appointment time.   If you have a lab appointment with the Cancer Center please come in thru the Main Entrance and check in at the main information desk  You need to re-schedule your appointment should you arrive 10 or more minutes late.  We strive to give you quality time with our providers, and arriving late affects you and other patients whose appointments are after yours.  Also, if you no show three or more times for appointments you may be dismissed from the clinic at the providers discretion.     Again, thank you for choosing [facility].  Our hope is that these requests will decrease the amount of time that you wait before being seen by our physicians.       _____________________________________________________________  Should you have questions after your visit to  Cancer Center, please contact our office at [phone] between the hours of 8:00 a.m. and 4:30 p.m.  Voicemails left after 4:00 p.m. will not be returned until the following business day.  For prescription refill requests, have your pharmacy contact our office and allow 72 hours.    Cancer Center Support Programs:   > Cancer Support Group  2nd Tuesday of the month 1pm-2pm, Journey Room   

## 2020-12-14 ENCOUNTER — Other Ambulatory Visit (HOSPITAL_COMMUNITY): Payer: Self-pay | Admitting: Physician Assistant

## 2020-12-14 ENCOUNTER — Other Ambulatory Visit (HOSPITAL_COMMUNITY): Payer: Self-pay

## 2020-12-14 ENCOUNTER — Inpatient Hospital Stay (HOSPITAL_COMMUNITY): Payer: Medicare Other

## 2020-12-14 ENCOUNTER — Encounter (HOSPITAL_COMMUNITY): Payer: Self-pay

## 2020-12-14 VITALS — BP 143/82 | HR 59 | Temp 96.8°F | Resp 18

## 2020-12-14 DIAGNOSIS — C9200 Acute myeloblastic leukemia, not having achieved remission: Secondary | ICD-10-CM

## 2020-12-14 DIAGNOSIS — D46Z Other myelodysplastic syndromes: Secondary | ICD-10-CM

## 2020-12-14 DIAGNOSIS — Z95828 Presence of other vascular implants and grafts: Secondary | ICD-10-CM

## 2020-12-14 MED ORDER — SODIUM CHLORIDE 0.9 % IV SOLN
15.0000 mg/m2 | Freq: Once | INTRAVENOUS | Status: AC
  Administered 2020-12-14: 35 mg via INTRAVENOUS
  Filled 2020-12-14: qty 7

## 2020-12-14 MED ORDER — SODIUM CHLORIDE 0.9% FLUSH
10.0000 mL | INTRAVENOUS | Status: DC | PRN
  Administered 2020-12-14: 10 mL

## 2020-12-14 MED ORDER — ONDANSETRON HCL 40 MG/20ML IJ SOLN
8.0000 mg | Freq: Once | INTRAMUSCULAR | Status: AC
  Administered 2020-12-14: 8 mg via INTRAVENOUS
  Filled 2020-12-14: qty 8

## 2020-12-14 MED ORDER — SODIUM CHLORIDE 0.9 % IV SOLN: Freq: Once | INTRAVENOUS | Status: AC

## 2020-12-14 MED ORDER — HEPARIN SOD (PORK) LOCK FLUSH 100 UNIT/ML IV SOLN
500.0000 [IU] | Freq: Once | INTRAVENOUS | Status: AC | PRN
  Administered 2020-12-14: 500 [IU]

## 2020-12-14 NOTE — Patient Instructions (Signed)
[facility]  Discharge Instructions: Thank you for choosing [facility] to provide your oncology and hematology care.  If you have a lab appointment with the [facility], please come in thru the Main Entrance and check in at the main information desk.  Wear comfortable clothing and clothing appropriate for easy access to any Portacath or PICC line.   We strive to give you quality time with your provider. You may need to reschedule your appointment if you arrive late (15 or more minutes).  Arriving late affects you and other patients whose appointments are after yours.  Also, if you miss three or more appointments without notifying the office, you may be dismissed from the clinic at the provider's discretion.      For prescription refill requests, have your pharmacy contact our office and allow 72 hours for refills to be completed.    Today you received the following chemotherapy and/or immunotherapy agents Decitabine.   To help prevent nausea and vomiting after your treatment, we encourage you to take your nausea medication as directed.  BELOW ARE SYMPTOMS THAT SHOULD BE REPORTED IMMEDIATELY: *FEVER GREATER THAN 100.4 F (38 C) OR HIGHER *CHILLS OR SWEATING *NAUSEA AND VOMITING THAT IS NOT CONTROLLED WITH YOUR NAUSEA MEDICATION *UNUSUAL SHORTNESS OF BREATH *UNUSUAL BRUISING OR BLEEDING *URINARY PROBLEMS (pain or burning when urinating, or frequent urination) *BOWEL PROBLEMS (unusual diarrhea, constipation, pain near the anus) TENDERNESS IN MOUTH AND THROAT WITH OR WITHOUT PRESENCE OF ULCERS (sore throat, sores in mouth, or a toothache) UNUSUAL RASH, SWELLING OR PAIN  UNUSUAL VAGINAL DISCHARGE OR ITCHING   Items with * indicate a potential emergency and should be followed up as soon as possible or go to the Emergency Department if any problems should occur.  Please show the CHEMOTHERAPY ALERT CARD or IMMUNOTHERAPY ALERT CARD at check-in to the Emergency  Department and triage nurse.  Should you have questions after your visit or need to cancel or reschedule your appointment, please contact [facility] [phone]  and follow the prompts.  Office hours are 8:00 a.m. to 4:30 p.m. Monday - Friday. Please note that voicemails left after 4:00 p.m. may not be returned until the following business day.  We are closed weekends and major holidays. You have access to a nurse at all times for urgent questions. Please call the main number to the clinic [phone] and follow the prompts.  For any non-urgent questions, you may also contact your provider using MyChart. We now offer e-Visits for anyone 76 and older to request care online for non-urgent symptoms. For details visit [email or web address].   Also download the MyChart app! Go to the app store, search "MyChart", open the app, select [facility], and log in with your MyChart username and password.  Due to Covid, a mask is required upon entering the hospital/clinic. If you do not have a mask, one will be given to you upon arrival. For doctor visits, patients may have 1 support person aged 76 or older with them. For treatment visits, patients cannot have anyone with them due to current Covid guidelines and our immunocompromised population.

## 2020-12-14 NOTE — Progress Notes (Signed)
Patient presents today for treatment. Vital signs within parameters for treatment. MAR reviewed . Patient denies any changes since his last treatment. Patient has no complaints today.

## 2020-12-14 NOTE — Progress Notes (Signed)
Treatment given today per MD orders. Tolerated infusion without adverse affects. Vital signs stable. No complaints at this time. Discharged from clinic via motorized chair in stable condition. Alert and oriented x 3. F/U with [facility] as scheduled.

## 2020-12-15 ENCOUNTER — Encounter (HOSPITAL_COMMUNITY): Payer: Self-pay

## 2020-12-15 ENCOUNTER — Other Ambulatory Visit: Payer: Self-pay

## 2020-12-15 ENCOUNTER — Inpatient Hospital Stay (HOSPITAL_COMMUNITY): Payer: Medicare Other

## 2020-12-15 VITALS — BP 141/72 | HR 61 | Temp 96.5°F | Resp 18

## 2020-12-15 DIAGNOSIS — Z95828 Presence of other vascular implants and grafts: Secondary | ICD-10-CM

## 2020-12-15 DIAGNOSIS — C9200 Acute myeloblastic leukemia, not having achieved remission: Secondary | ICD-10-CM | POA: Diagnosis not present

## 2020-12-15 DIAGNOSIS — D46Z Other myelodysplastic syndromes: Secondary | ICD-10-CM

## 2020-12-15 MED ORDER — SODIUM CHLORIDE 0.9 % IV SOLN: Freq: Once | INTRAVENOUS | Status: AC

## 2020-12-15 MED ORDER — SODIUM CHLORIDE 0.9 % IV SOLN
8.0000 mg | Freq: Once | INTRAVENOUS | Status: AC
  Administered 2020-12-15: 8 mg via INTRAVENOUS
  Filled 2020-12-15: qty 8

## 2020-12-15 MED ORDER — SODIUM CHLORIDE 0.9 % IV SOLN
15.0000 mg/m2 | Freq: Once | INTRAVENOUS | Status: AC
  Administered 2020-12-15: 35 mg via INTRAVENOUS
  Filled 2020-12-15: qty 7

## 2020-12-15 MED ORDER — SODIUM CHLORIDE 0.9% FLUSH
10.0000 mL | INTRAVENOUS | Status: DC | PRN
  Administered 2020-12-15: 10 mL

## 2020-12-15 MED ORDER — HEPARIN SOD (PORK) LOCK FLUSH 100 UNIT/ML IV SOLN
500.0000 [IU] | Freq: Once | INTRAVENOUS | Status: AC | PRN
  Administered 2020-12-15: 500 [IU]

## 2020-12-15 NOTE — Patient Instructions (Signed)
[facility]  Discharge Instructions: Thank you for choosing [facility] to provide your oncology and hematology care.  If you have a lab appointment with the [facility], please come in thru the Main Entrance and check in at the main information desk.  Wear comfortable clothing and clothing appropriate for easy access to any Portacath or PICC line.   We strive to give you quality time with your provider. You may need to reschedule your appointment if you arrive late (15 or more minutes).  Arriving late affects you and other patients whose appointments are after yours.  Also, if you miss three or more appointments without notifying the office, you may be dismissed from the clinic at the provider's discretion.      For prescription refill requests, have your pharmacy contact our office and allow 72 hours for refills to be completed.    Today you received the following chemotherapy and/or immunotherapy agents Decitabine.       To help prevent nausea and vomiting after your treatment, we encourage you to take your nausea medication as directed.  BELOW ARE SYMPTOMS THAT SHOULD BE REPORTED IMMEDIATELY: *FEVER GREATER THAN 100.4 F (38 C) OR HIGHER *CHILLS OR SWEATING *NAUSEA AND VOMITING THAT IS NOT CONTROLLED WITH YOUR NAUSEA MEDICATION *UNUSUAL SHORTNESS OF BREATH *UNUSUAL BRUISING OR BLEEDING *URINARY PROBLEMS (pain or burning when urinating, or frequent urination) *BOWEL PROBLEMS (unusual diarrhea, constipation, pain near the anus) TENDERNESS IN MOUTH AND THROAT WITH OR WITHOUT PRESENCE OF ULCERS (sore throat, sores in mouth, or a toothache) UNUSUAL RASH, SWELLING OR PAIN  UNUSUAL VAGINAL DISCHARGE OR ITCHING   Items with * indicate a potential emergency and should be followed up as soon as possible or go to the Emergency Department if any problems should occur.  Please show the CHEMOTHERAPY ALERT CARD or IMMUNOTHERAPY ALERT CARD at check-in to the Emergency  Department and triage nurse.  Should you have questions after your visit or need to cancel or reschedule your appointment, please contact [facility] [phone]  and follow the prompts.  Office hours are 8:00 a.m. to 4:30 p.m. Monday - Friday. Please note that voicemails left after 4:00 p.m. may not be returned until the following business day.  We are closed weekends and major holidays. You have access to a nurse at all times for urgent questions. Please call the main number to the clinic [phone] and follow the prompts.  For any non-urgent questions, you may also contact your provider using MyChart. We now offer e-Visits for anyone 23 and older to request care online for non-urgent symptoms. For details visit [email or web address].   Also download the MyChart app! Go to the app store, search "MyChart", open the app, select [facility], and log in with your MyChart username and password.  Due to Covid, a mask is required upon entering the hospital/clinic. If you do not have a mask, one will be given to you upon arrival. For doctor visits, patients may have 1 support person aged 55 or older with them. For treatment visits, patients cannot have anyone with them due to current Covid guidelines and our immunocompromised population.

## 2020-12-15 NOTE — Progress Notes (Signed)
Patient presents today for D3 Decitabine. Vital signs stable. Patient taking Venetoclax as prescribed with no complaints of any side effects. MAR reviewed.   Treatment given today per MD orders. Tolerated infusion without adverse affects. Vital signs stable. No complaints at this time. Discharged from clinic by motorized chair in stable condition. Alert and oriented x 3. F/U with [facility] as scheduled.

## 2020-12-16 ENCOUNTER — Inpatient Hospital Stay (HOSPITAL_COMMUNITY): Payer: Medicare Other

## 2020-12-16 VITALS — BP 140/71 | HR 60 | Temp 97.7°F | Resp 16

## 2020-12-16 DIAGNOSIS — D46Z Other myelodysplastic syndromes: Secondary | ICD-10-CM

## 2020-12-16 DIAGNOSIS — C9200 Acute myeloblastic leukemia, not having achieved remission: Secondary | ICD-10-CM | POA: Diagnosis not present

## 2020-12-16 DIAGNOSIS — Z95828 Presence of other vascular implants and grafts: Secondary | ICD-10-CM

## 2020-12-16 MED ORDER — SODIUM CHLORIDE 0.9 % IV SOLN
15.0000 mg/m2 | Freq: Once | INTRAVENOUS | Status: AC
  Administered 2020-12-16: 35 mg via INTRAVENOUS
  Filled 2020-12-16: qty 7

## 2020-12-16 MED ORDER — SODIUM CHLORIDE 0.9 % IV SOLN
8.0000 mg | Freq: Once | INTRAVENOUS | Status: AC
  Administered 2020-12-16: 8 mg via INTRAVENOUS
  Filled 2020-12-16: qty 8

## 2020-12-16 MED ORDER — SODIUM CHLORIDE 0.9 % IV SOLN: Freq: Once | INTRAVENOUS | Status: AC

## 2020-12-16 MED ORDER — HEPARIN SOD (PORK) LOCK FLUSH 100 UNIT/ML IV SOLN
500.0000 [IU] | Freq: Once | INTRAVENOUS | Status: AC | PRN
  Administered 2020-12-16: 500 [IU]

## 2020-12-16 MED ORDER — SODIUM CHLORIDE 0.9% FLUSH
10.0000 mL | INTRAVENOUS | Status: DC | PRN
Start: 2020-12-16 — End: 2020-12-16
  Administered 2020-12-16: 10 mL

## 2020-12-16 NOTE — Progress Notes (Signed)
Patient presents today for treatment per orders.  Patient tolerated treatment well with no complaints voiced.  Patient left via motorized wheelchair in stable condition.  Vital signs stable at discharge.  Follow up as scheduled.

## 2020-12-16 NOTE — Patient Instructions (Signed)
[facility]  Discharge Instructions: Thank you for choosing [facility] to provide your oncology and hematology care.  If you have a lab appointment with the Cancer Center, please come in thru the Main Entrance and check in at the main information desk.  Wear comfortable clothing and clothing appropriate for easy access to any Portacath or PICC line.   We strive to give you quality time with your provider. You may need to reschedule your appointment if you arrive late (15 or more minutes).  Arriving late affects you and other patients whose appointments are after yours.  Also, if you miss three or more appointments without notifying the office, you may be dismissed from the clinic at the provider's discretion.      For prescription refill requests, have your pharmacy contact our office and allow 72 hours for refills to be completed.        To help prevent nausea and vomiting after your treatment, we encourage you to take your nausea medication as directed.  BELOW ARE SYMPTOMS THAT SHOULD BE REPORTED IMMEDIATELY: *FEVER GREATER THAN 100.4 F (38 C) OR HIGHER *CHILLS OR SWEATING *NAUSEA AND VOMITING THAT IS NOT CONTROLLED WITH YOUR NAUSEA MEDICATION *UNUSUAL SHORTNESS OF BREATH *UNUSUAL BRUISING OR BLEEDING *URINARY PROBLEMS (pain or burning when urinating, or frequent urination) *BOWEL PROBLEMS (unusual diarrhea, constipation, pain near the anus) TENDERNESS IN MOUTH AND THROAT WITH OR WITHOUT PRESENCE OF ULCERS (sore throat, sores in mouth, or a toothache) UNUSUAL RASH, SWELLING OR PAIN  UNUSUAL VAGINAL DISCHARGE OR ITCHING   Items with * indicate a potential emergency and should be followed up as soon as possible or go to the Emergency Department if any problems should occur.  Please show the CHEMOTHERAPY ALERT CARD or IMMUNOTHERAPY ALERT CARD at check-in to the Emergency Department and triage nurse.  Should you have questions after your visit or need to cancel  or reschedule your appointment, please contact [facility] [phone]  and follow the prompts.  Office hours are 8:00 a.m. to 4:30 p.m. Monday - Friday. Please note that voicemails left after 4:00 p.m. may not be returned until the following business day.  We are closed weekends and major holidays. You have access to a nurse at all times for urgent questions. Please call the main number to the clinic [phone] and follow the prompts.  For any non-urgent questions, you may also contact your provider using MyChart. We now offer e-Visits for anyone 18 and older to request care online for non-urgent symptoms. For details visit [email or web address].   Also download the MyChart app! Go to the app store, search "MyChart", open the app, select [facility], and log in with your MyChart username and password.  Due to Covid, a mask is required upon entering the hospital/clinic. If you do not have a mask, one will be given to you upon arrival. For doctor visits, patients may have 1 support person aged 18 or older with them. For treatment visits, patients cannot have anyone with them due to current Covid guidelines and our immunocompromised population.  

## 2020-12-17 ENCOUNTER — Encounter (HOSPITAL_COMMUNITY): Payer: Self-pay | Admitting: *Deleted

## 2020-12-17 ENCOUNTER — Encounter (HOSPITAL_COMMUNITY): Payer: Self-pay

## 2020-12-17 ENCOUNTER — Inpatient Hospital Stay (HOSPITAL_COMMUNITY): Payer: Medicare Other

## 2020-12-17 ENCOUNTER — Other Ambulatory Visit: Payer: Self-pay

## 2020-12-17 VITALS — BP 138/78 | HR 58 | Temp 98.0°F | Resp 18

## 2020-12-17 DIAGNOSIS — D46Z Other myelodysplastic syndromes: Secondary | ICD-10-CM

## 2020-12-17 DIAGNOSIS — Z95828 Presence of other vascular implants and grafts: Secondary | ICD-10-CM

## 2020-12-17 DIAGNOSIS — C9200 Acute myeloblastic leukemia, not having achieved remission: Secondary | ICD-10-CM | POA: Diagnosis not present

## 2020-12-17 MED ORDER — SODIUM CHLORIDE 0.9% FLUSH
10.0000 mL | INTRAVENOUS | Status: DC | PRN
  Administered 2020-12-17: 10 mL

## 2020-12-17 MED ORDER — HEPARIN SOD (PORK) LOCK FLUSH 100 UNIT/ML IV SOLN
500.0000 [IU] | Freq: Once | INTRAVENOUS | Status: AC | PRN
  Administered 2020-12-17: 500 [IU]

## 2020-12-17 MED ORDER — SODIUM CHLORIDE 0.9 % IV SOLN: Freq: Once | INTRAVENOUS | Status: AC

## 2020-12-17 MED ORDER — SODIUM CHLORIDE 0.9 % IV SOLN
8.0000 mg | Freq: Once | INTRAVENOUS | Status: AC
  Administered 2020-12-17: 8 mg via INTRAVENOUS
  Filled 2020-12-17: qty 8

## 2020-12-17 MED ORDER — SODIUM CHLORIDE 0.9 % IV SOLN
15.0000 mg/m2 | Freq: Once | INTRAVENOUS | Status: AC
  Administered 2020-12-17: 35 mg via INTRAVENOUS
  Filled 2020-12-17: qty 7

## 2020-12-17 NOTE — Progress Notes (Signed)
Pt presents today for D5 Decitabine per provider's order. Vital signs stable for treatment. Pt voiced no new complaints at this time.  D5 Decitabine given today per MD orders. Tolerated infusion without adverse affects. Vital signs stable. No complaints at this time. Discharged from clinic via motorized chair in stable condition. Alert and oriented x 3. F/U with [facility] as scheduled.

## 2020-12-17 NOTE — Patient Instructions (Addendum)
[employer]  Discharge Instructions: Thank you for choosing [facility] to provide your oncology and hematology care.  If you have a lab appointment with the [facility], please come in thru the Main Entrance and check in at the main information desk.  Wear comfortable clothing and clothing appropriate for easy access to any Portacath or PICC line.   We strive to give you quality time with your provider. You may need to reschedule your appointment if you arrive late (15 or more minutes).  Arriving late affects you and other patients whose appointments are after yours.  Also, if you miss three or more appointments without notifying the office, you may be dismissed from the clinic at the provider's discretion.      For prescription refill requests, have your pharmacy contact our office and allow 72 hours for refills to be completed.    Today you received the following chemotherapy and/or immunotherapy agents    To help prevent nausea and vomiting after your treatment, we encourage you to take your nausea medication as directed.  BELOW ARE SYMPTOMS THAT SHOULD BE REPORTED IMMEDIATELY: *FEVER GREATER THAN 100.4 F (38 C) OR HIGHER *CHILLS OR SWEATING *NAUSEA AND VOMITING THAT IS NOT CONTROLLED WITH YOUR NAUSEA MEDICATION *UNUSUAL SHORTNESS OF BREATH *UNUSUAL BRUISING OR BLEEDING *URINARY PROBLEMS (pain or burning when urinating, or frequent urination) *BOWEL PROBLEMS (unusual diarrhea, constipation, pain near the anus) TENDERNESS IN MOUTH AND THROAT WITH OR WITHOUT PRESENCE OF ULCERS (sore throat, sores in mouth, or a toothache) UNUSUAL RASH, SWELLING OR PAIN  UNUSUAL VAGINAL DISCHARGE OR ITCHING   Items with * indicate a potential emergency and should be followed up as soon as possible or go to the Emergency Department if any problems should occur.  Please show the CHEMOTHERAPY ALERT CARD or IMMUNOTHERAPY ALERT CARD at check-in to the Emergency Department and  triage nurse.  Should you have questions after your visit or need to cancel or reschedule your appointment, please contact [facility] [phone]  and follow the prompts.  Office hours are 8:00 a.m. to 4:30 p.m. Monday - Friday. Please note that voicemails left after 4:00 p.m. may not be returned until the following business day.  We are closed weekends and major holidays. You have access to a nurse at all times for urgent questions. Please call the main number to the clinic [phone] and follow the prompts.  For any non-urgent questions, you may also contact your provider using MyChart. We now offer e-Visits for anyone 38 and older to request care online for non-urgent symptoms. For details visit [email or web address].   Also download the MyChart app! Go to the app store, search "MyChart", open the app, select [facility], and log in with your MyChart username and password.  Due to Covid, a mask is required upon entering the hospital/clinic. If you do not have a mask, one will be given to you upon arrival. For doctor visits, patients may have 1 support person aged 36 or older with them. For treatment visits, patients cannot have anyone with them due to current Covid guidelines and our immunocompromised population. [facility]  Discharge Instructions: Thank you for choosing [facility] to provide your oncology and hematology care.  If you have a lab appointment with the [facility], please come in thru the Main Entrance and check in at the main information desk.  Wear comfortable clothing and clothing appropriate for easy access to any Portacath or PICC  line.   We strive to give you quality time with your provider. You may need to reschedule your appointment if you arrive late (15 or more minutes).  Arriving late affects you and other patients whose appointments are after yours.  Also, if you miss three or more appointments without notifying the office,  you may be dismissed from the clinic at the provider's discretion.      For prescription refill requests, have your pharmacy contact our office and allow 72 hours for refills to be completed.    Today you received the following chemotherapy and/or immunotherapy agents Decitabine.   To help prevent nausea and vomiting after your treatment, we encourage you to take your nausea medication as directed.  BELOW ARE SYMPTOMS THAT SHOULD BE REPORTED IMMEDIATELY: *FEVER GREATER THAN 100.4 F (38 C) OR HIGHER *CHILLS OR SWEATING *NAUSEA AND VOMITING THAT IS NOT CONTROLLED WITH YOUR NAUSEA MEDICATION *UNUSUAL SHORTNESS OF BREATH *UNUSUAL BRUISING OR BLEEDING *URINARY PROBLEMS (pain or burning when urinating, or frequent urination) *BOWEL PROBLEMS (unusual diarrhea, constipation, pain near the anus) TENDERNESS IN MOUTH AND THROAT WITH OR WITHOUT PRESENCE OF ULCERS (sore throat, sores in mouth, or a toothache) UNUSUAL RASH, SWELLING OR PAIN  UNUSUAL VAGINAL DISCHARGE OR ITCHING   Items with * indicate a potential emergency and should be followed up as soon as possible or go to the Emergency Department if any problems should occur.  Please show the CHEMOTHERAPY ALERT CARD or IMMUNOTHERAPY ALERT CARD at check-in to the Emergency Department and triage nurse.  Should you have questions after your visit or need to cancel or reschedule your appointment, please contact [facility] [phone]  and follow the prompts.  Office hours are 8:00 a.m. to 4:30 p.m. Monday - Friday. Please note that voicemails left after 4:00 p.m. may not be returned until the following business day.  We are closed weekends and major holidays. You have access to a nurse at all times for urgent questions. Please call the main number to the clinic [phone] and follow the prompts.  For any non-urgent questions, you may also contact your provider using MyChart. We now offer e-Visits for anyone 59 and older to request care  online for non-urgent symptoms. For details visit [email or web address].   Also download the MyChart app! Go to the app store, search "MyChart", open the app, select [facility], and log in with your MyChart username and password.  Due to Covid, a mask is required upon entering the hospital/clinic. If you do not have a mask, one will be given to you upon arrival. For doctor visits, patients may have 1 support person aged 9 or older with them. For treatment visits, patients cannot have anyone with them due to current Covid guidelines and our immunocompromised population.

## 2020-12-17 NOTE — Progress Notes (Signed)
Richard Wallace's wife called clinic yesterday. She said that [name] wouldn't share with the nursing team up here, in detail as much as he should. 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 She said that he has started dropping things. She just wanted [place] to be aware. I have informed Dr. Delton Coombes and the nursing team that have him today.

## 2020-12-20 ENCOUNTER — Other Ambulatory Visit (HOSPITAL_COMMUNITY): Payer: Self-pay | Admitting: Hematology

## 2020-12-23 ENCOUNTER — Other Ambulatory Visit (HOSPITAL_COMMUNITY): Payer: Self-pay

## 2020-12-27 ENCOUNTER — Other Ambulatory Visit (HOSPITAL_COMMUNITY): Payer: Self-pay | Admitting: Physician Assistant

## 2020-12-27 ENCOUNTER — Other Ambulatory Visit (HOSPITAL_COMMUNITY): Payer: Self-pay | Admitting: Hematology

## 2020-12-27 DIAGNOSIS — C9200 Acute myeloblastic leukemia, not having achieved remission: Secondary | ICD-10-CM

## 2020-12-27 DIAGNOSIS — D46Z Other myelodysplastic syndromes: Secondary | ICD-10-CM

## 2020-12-30 ENCOUNTER — Other Ambulatory Visit (HOSPITAL_COMMUNITY): Payer: Self-pay

## 2020-12-31 ENCOUNTER — Encounter (HOSPITAL_COMMUNITY): Payer: Self-pay

## 2020-12-31 MED ORDER — LIDOCAINE 5 % EX PTCH: 1.0000 | MEDICATED_PATCH | CUTANEOUS | 0 refills | Status: DC

## 2020-12-31 MED ORDER — DICLOFENAC SODIUM 1 % EX GEL: CUTANEOUS | 3 refills | Status: DC

## 2021-01-01 ENCOUNTER — Other Ambulatory Visit (HOSPITAL_COMMUNITY): Payer: Self-pay | Admitting: Hematology

## 2021-01-01 DIAGNOSIS — C9200 Acute myeloblastic leukemia, not having achieved remission: Secondary | ICD-10-CM

## 2021-01-01 DIAGNOSIS — D46Z Other myelodysplastic syndromes: Secondary | ICD-10-CM

## 2021-01-04 ENCOUNTER — Encounter (HOSPITAL_COMMUNITY): Payer: Self-pay | Admitting: Hematology

## 2021-01-11 ENCOUNTER — Other Ambulatory Visit (HOSPITAL_COMMUNITY): Payer: Self-pay | Admitting: Hematology

## 2021-01-11 ENCOUNTER — Other Ambulatory Visit (HOSPITAL_COMMUNITY): Payer: Self-pay

## 2021-01-11 DIAGNOSIS — C9200 Acute myeloblastic leukemia, not having achieved remission: Secondary | ICD-10-CM

## 2021-01-11 MED ORDER — VENETOCLAX 100 MG PO TABS
ORAL_TABLET | ORAL | 2 refills | Status: DC
  Filled 2021-01-11: qty 60, 30d supply, fill #0
  Filled 2021-05-30: qty 60, 30d supply, fill #1
  Filled 2021-09-22: qty 60, 30d supply, fill #2

## 2021-01-11 MED ORDER — HYDROCODONE-ACETAMINOPHEN 10-325 MG PO TABS: 1.0000 | ORAL_TABLET | Freq: Four times a day (QID) | ORAL | 0 refills | Status: DC | PRN

## 2021-01-13 ENCOUNTER — Other Ambulatory Visit (HOSPITAL_COMMUNITY): Payer: Self-pay

## 2021-01-15 ENCOUNTER — Other Ambulatory Visit (HOSPITAL_COMMUNITY): Payer: Self-pay | Admitting: Hematology

## 2021-01-15 DIAGNOSIS — D46Z Other myelodysplastic syndromes: Secondary | ICD-10-CM

## 2021-01-15 DIAGNOSIS — C9200 Acute myeloblastic leukemia, not having achieved remission: Secondary | ICD-10-CM

## 2021-01-17 ENCOUNTER — Encounter (HOSPITAL_COMMUNITY): Payer: Self-pay | Admitting: Hematology

## 2021-01-17 ENCOUNTER — Other Ambulatory Visit (HOSPITAL_COMMUNITY): Payer: Self-pay

## 2021-01-17 DIAGNOSIS — D46Z Other myelodysplastic syndromes: Secondary | ICD-10-CM

## 2021-01-17 DIAGNOSIS — C9200 Acute myeloblastic leukemia, not having achieved remission: Secondary | ICD-10-CM

## 2021-01-17 MED ORDER — ALLOPURINOL 300 MG PO TABS: 300.0000 mg | ORAL_TABLET | Freq: Every day | ORAL | 3 refills | Status: DC

## 2021-01-17 MED ORDER — DONEPEZIL HCL 10 MG PO TABS: 10.0000 mg | ORAL_TABLET | Freq: Every day | ORAL | 3 refills | Status: DC

## 2021-01-23 NOTE — Progress Notes (Signed)
Richard Wallace, [place] [address]   CLINIC:  Medical Oncology/Hematology  PCP:  Richard Sos, MD [address] [place] [address] [phone]   REASON FOR VISIT:  Follow-up for AML  PRIOR THERAPY: none  NGS Results: not done  CURRENT THERAPY: Decitabine every 6 weeks & venetoclax 200 mg x2 weeks after chemo  BRIEF ONCOLOGIC HISTORY:  Oncology History  MDS (myelodysplastic syndrome), high grade ([facility])  08/14/2018 Initial Diagnosis   MDS (myelodysplastic syndrome), high grade ([place])   08/22/2018 - 12/06/2018 Chemotherapy   The patient had palonosetron (ALOXI) injection 0.25 mg, 0.25 mg, Intravenous,  Once, 4 of 6 cycles Administration: 0.25 mg (08/22/2018), 0.25 mg (08/26/2018), 0.25 mg (08/28/2018), 0.25 mg (08/30/2018), 0.25 mg (10/01/2018), 0.25 mg (10/02/2018), 0.25 mg (11/04/2018), 0.25 mg (09/23/2018), 0.25 mg (09/25/2018), 0.25 mg (09/27/2018), 0.25 mg (10/28/2018), 0.25 mg (10/30/2018), 0.25 mg (11/01/2018), 0.25 mg (12/02/2018), 0.25 mg (12/04/2018), 0.25 mg (12/06/2018) azaCITIDine (VIDAZA) 100 mg in sodium chloride 0.9 % 50 mL chemo infusion, 110 mg (66.7 % of original dose 75 mg/m2), Intravenous, Once, 4 of 6 cycles Dose modification: 50 mg/m2 (original dose 75 mg/m2, Cycle 1, Reason: Provider Judgment), 50 mg/m2 (original dose 75 mg/m2, Cycle 2, Reason: Provider Judgment) Administration: 100 mg (08/22/2018), 100 mg (08/23/2018), 100 mg (08/26/2018), 100 mg (08/27/2018), 100 mg (08/28/2018), 100 mg (08/29/2018), 100 mg (08/30/2018), 100 mg (10/01/2018), 100 mg (10/02/2018), 100 mg (11/04/2018), 100 mg (11/05/2018), 100 mg (09/23/2018), 100 mg (09/24/2018), 100 mg (09/25/2018), 100 mg (09/26/2018), 100 mg (09/27/2018), 100 mg (10/28/2018), 100 mg (10/29/2018), 100 mg (10/30/2018), 100 mg (10/31/2018), 100 mg (11/01/2018), 100 mg (12/02/2018), 100 mg (12/03/2018), 100 mg (12/04/2018), 100 mg (12/05/2018), 100 mg (12/06/2018)   for chemotherapy treatment.     01/20/2019 -   Chemotherapy    Patient is on Treatment Plan: MYELODYSPLASIA DECITABINE D1-5 Q42D       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[facility])  01/07/2019 Initial Diagnosis   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[facility])   01/20/2019 -  Chemotherapy    Patient is on Treatment Plan: MYELODYSPLASIA DECITABINE D1-5 Q42D         CANCER STAGING: Cancer Staging No matching staging information was found for the patient.  INTERVAL HISTORY:  [name], a 75 y.o. male, returns for routine follow-up and consideration for next cycle of chemotherapy. [name] was last seen on 12/13/2020.  Due for cycle #18 of Decitabine today.   Overall, he tells me he has been feeling pretty well. He report numbness in his toes and fingers . The numbness in his toes is constant and was present prior to treatment, but the numbness in his fingertips is intermittent has been present for 3 months; he denies any associated pain. 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 This has decreased his grip strength. His appetite has been good, and he denies n/v/d. He reports occasional constipation for which he takes 2 Colace tablets daily. He denies any recent infections or hospitalizations.   Overall, he feels ready for next cycle of chemo today.   REVIEW OF SYSTEMS:  Review of Systems  Constitutional:  Positive for fatigue (10%). Negative for appetite change (90%).  Gastrointestinal:  Positive for constipation. Negative for diarrhea, nausea and vomiting.  Neurological:  Positive for numbness (tingling in hands and feet).  All other systems reviewed and are negative.  PAST MEDICAL/SURGICAL HISTORY:  Past Medical History:  Diagnosis Date   Arthritis    Atrophy  of left kidney    only 7.8% functioning   Cancer ([address]) 01-28-2014   skin cancer   CKD (chronic kidney disease), stage III (HCC)    GERD (gastroesophageal reflux disease)    Heart murmur    NOTED DURING PHYSICAL WHEN HE WAS ENLISTING IN [facility] , DIDNT KNOW  UNTIL THAT TIME AND REPORTS , "THATS THE LAST I HEARD ABOUT IT "    History of hypertension    no longer issue   History of kidney stones    History of malignant melanoma of skin    excision top of scalp 2015-- no recurrence   History of urinary retention    post op lumbar fusion surgery 04/ 2016   Hypertension    Kidney dysfunction    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, MONITORED BY [facility] DR Richard Wallace    Left ureteral calculus    Seasonal allergies    Wears glasses    Wears glasses    Wears partial dentures    upper and lower   Past Surgical History:  Procedure Laterality Date   ANKLE FUSION Right 2007   CARPAL TUNNEL RELEASE Left 12/28/2009   w/ pulley release left long finger   CARPAL TUNNEL RELEASE Right 07/22/2013   Procedure: RIGHT CARPAL TUNNEL RELEASE;  Surgeon: Richard Wallace., MD;  Location: [facility];  Service: Orthopedics;  Laterality: Right;   COLONOSCOPY     CYSTO/  LEFT RETROGRADE PYELOGRAM  11/21/2010   CYSTOSCOPY WITH STENT PLACEMENT Left 03/09/2016   Procedure: CYSTOSCOPY WITH STENT PLACEMENT;  Surgeon: Richard Gallo, MD;  Location: [facility];  Service: Urology;  Laterality: Left;   CYSTOSCOPY/RETROGRADE/URETEROSCOPY/STONE EXTRACTION WITH BASKET Left 03/09/2016   Procedure: CYSTOSCOPY/RETROGRADE/URETEROSCOPY/STONE EXTRACTION WITH BASKET;  Surgeon: Richard Gallo, MD;  Location: [facility];  Service: Urology;  Laterality: Left;   LEFT URETEROSCOPIC LASER LITHOTRIPSY STONE EXTRACTION/ STENT PLACEMENT  05/23/2010   MOHS SURGERY     TOP OF THE HEAD    ORIF ANKLE FRACTURE Right 1978   PORT-A-CATH REMOVAL Right 02/14/2019   Procedure: MINOR REMOVAL PORT-A-CATH;  Surgeon: Richard Signs, MD;  Location: [facility];  Service: General;  Laterality: Right;   PORTACATH PLACEMENT Right 08/19/2018   Procedure: INSERTION PORT-A-CATH (attached catheter in right subclavian);  Surgeon: Richard Signs, MD;  Location:  [facility];  Service: General;  Laterality: Right;   PORTACATH PLACEMENT Left 05/16/2019   Procedure: INSERTION PORT-A-CATH (attached catheter in left subclavian);  Surgeon: Richard Signs, MD;  Location: [facility];  Service: General;  Laterality: Left;   POSTERIOR LUMBAR FUSION  08/21/2014   laminectomy and decompression L2 -- L5   RIGHT LOWER LEG SURGERY  X3  1975 to 1976   including ORIF   TONSILLECTOMY AND ADENOIDECTOMY  [id number]   UMBILICAL HERNIA REPAIR  2009 approx    SOCIAL HISTORY:  Social History   Socioeconomic History   Marital status: Married    Spouse name: Not on file   Number of children: Not on file   Years of education: Not on file   Highest education level: Not on file  Occupational History   Not on file  Tobacco Use   Smoking status: Former    Years: 20.00    Types: Cigarettes    Quit date: 07/17/1986    Years since quitting: 34.5   Smokeless tobacco: Never  Vaping Use   Vaping Use: Never used  Substance and Sexual Activity   Alcohol use: Yes  Alcohol/week: 7.0 - 14.0 standard drinks    Types: 7 - 14 Cans of beer per week    Comment: 1 -2 beer daily   Drug use: No   Sexual activity: Not Currently  Other Topics Concern   Not on file  Social History Narrative   Not on file   Social Determinants of Health   Financial Resource Strain: Low Risk    Difficulty of Paying Living Expenses: Not hard at all  Food Insecurity: No Food Insecurity   Worried About Charity fundraiser in the Last Year: Never true   Ran Out of Food in the Last Year: Never true  Transportation Needs: No Transportation Needs   Lack of Transportation (Medical): No   Lack of Transportation (Non-Medical): No  Physical Activity: Not on file  Stress: No Stress Concern Present   Feeling of Stress : Not at all  Social Connections: Moderately Isolated   Frequency of Communication with Friends and Family: Twice a week   Frequency of Social Gatherings with Friends and Family: Never   Attends  Religious Services: Never   Marine scientist or Organizations: Yes   Attends Music therapist: More than 4 times per year   Marital Status: Married  Human resources officer Violence: Not on file    FAMILY HISTORY:  Family History  Problem Relation Age of Onset   Stroke Mother    Prostate cancer Father    Bone cancer Father    Diverticulitis Father    Rheum arthritis Sister    Urinary tract infection Sister    Colon cancer Neg Hx     CURRENT MEDICATIONS:  Current Outpatient Medications  Medication Sig Dispense Refill   allopurinol (ZYLOPRIM) 300 MG tablet Take 1 tablet (300 mg total) by mouth at bedtime. 90 tablet 3   ALPRAZolam (XANAX) 0.25 MG tablet Take 1 tablet by mouth twice daily as needed for anxiety 60 tablet 0   amLODipine (NORVASC) 2.5 MG tablet Take 1 tablet (2.5 mg total) by mouth daily. 90 tablet 2   cetirizine (ZYRTEC) 10 MG tablet Take 10 mg by mouth daily. IN THE MORNING     docusate sodium (COLACE) 100 MG capsule Take 100 mg by mouth at bedtime.      donepezil (ARICEPT) 10 MG tablet Take 1 tablet (10 mg total) by mouth at bedtime. 30 tablet 3   ipratropium (ATROVENT) 0.03 % nasal spray Place 2 sprays into both nostrils 3 (three) times daily.     lansoprazole (PREVACID) 15 MG capsule Take 15 mg by mouth daily.      lidocaine (LIDODERM) 5 % Place 1 patch onto the skin daily. Remove & Discard patch within 12 hours or as directed by MD 30 patch 0   lidocaine-prilocaine (EMLA) cream Apply 1 application topically See admin instructions. ONE HOUR PRIOR TO CHEMOTHERAPY APPOINTMENT     magnesium oxide (MAG-OX) 400 (241.3 Mg) MG tablet Take 1 tablet (400 mg total) by mouth 3 (three) times daily. (Patient taking differently: Take 1 tablet by mouth 4 (four) times daily.) 90 tablet 3   SF 5000 PLUS 1.1 % CREA dental cream SMARTSIG:Sparingly By Mouth Every Night     tamsulosin (FLOMAX) 0.4 MG CAPS capsule Take 1 capsule (0.4 mg total) by mouth every evening. 30 capsule  2   venetoclax (VENCLEXTA) 100 MG tablet TAKE 2 TABLETS (200 MG) BY MOUTH DAILY 60 tablet 2   diclofenac Sodium (VOLTAREN) 1 % GEL Apply three times daily to knees  as needed for pain (Patient not taking: Reported on 01/24/2021) 50 g 3   HYDROcodone-acetaminophen (NORCO) 10-325 MG tablet Take 1 tablet by mouth every 6 (six) hours as needed. (Patient not taking: Reported on 01/24/2021) 112 tablet 0   No current facility-administered medications for this visit.   Facility-Administered Medications Ordered in Other Visits  Medication Dose Route Frequency Provider Last Rate Last Admin   sodium chloride flush (NS) 0.9 % injection 10 mL  10 mL Intracatheter PRN [name], MD   10 mL at 11/21/19 1030   sodium chloride flush (NS) 0.9 % injection 20 mL  20 mL Intravenous PRN [name], MD   20 mL at 07/21/19 1053    ALLERGIES:  No Known Allergies  PHYSICAL EXAM:  Performance status (ECOG): 1 - Symptomatic but completely ambulatory  Vitals:   01/24/21 1019  BP: 126/72  Pulse: 68  Resp: 18  Temp: (!) 96.7 F (35.9 C)  SpO2: 96%   Wt Readings from Last 3 Encounters:  01/24/21 215 lb 3.2 oz (97.6 kg)  12/13/20 217 lb 11.2 oz (98.7 kg)  11/01/20 217 lb (98.4 kg)   Physical Exam Vitals reviewed.  Constitutional:      Appearance: Normal appearance.  Cardiovascular:     Rate and Rhythm: Normal rate and regular rhythm.     Pulses: Normal pulses.     Heart sounds: Normal heart sounds.  Pulmonary:     Effort: Pulmonary effort is normal.     Breath sounds: Normal breath sounds.  Neurological:     General: No focal deficit present.     Mental Status: He is alert and oriented to person, place, and time.  Psychiatric:        Mood and Affect: Mood normal.        Behavior: Behavior normal.    LABORATORY DATA:  I have reviewed the labs as listed.  CBC Latest Ref Rng & Units 01/24/2021 12/13/2020 11/26/2020  WBC 4.0 - 10.5 K/uL 4.2 3.6(L) 6.2  Hemoglobin 13.0 - 17.0 g/dL  14.8 14.1 14.8  Hematocrit 39.0 - 52.0 % 45.3 43.8 45.6  Platelets 150 - 400 K/uL 130(L) 142(L) 156   CMP Latest Ref Rng & Units 01/24/2021 12/13/2020 11/26/2020  Glucose 70 - 99 mg/dL 111(H) 112(H) 103(H)  BUN 8 - 23 mg/dL '20 19 18  '$ Creatinine 0.61 - 1.24 mg/dL 1.07 0.93 0.90  Sodium 135 - 145 mmol/L 138 140 141  Potassium 3.5 - 5.1 mmol/L 4.8 4.5 4.3  Chloride 98 - 111 mmol/L 105 109 107  CO2 22 - 32 mmol/L '28 28 28  '$ Calcium 8.9 - 10.3 mg/dL 8.9 9.1 9.3  Total Protein 6.5 - 8.1 g/dL 6.2(L) 5.9(L) 6.3(L)  Total Bilirubin 0.3 - 1.2 mg/dL 0.6 0.5 0.5  Alkaline Phos 38 - 126 U/L 74 70 72  AST 15 - 41 U/L '22 23 26  '$ ALT 0 - 44 U/L '26 25 28    '$ DIAGNOSTIC IMAGING:  I have independently reviewed the scans and discussed with the patient. No results found.   ASSESSMENT:  1.  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  -BMBX on 02/25/2019 after cycle 1 did not show any evidence of leukemia. -CT CAP on 02/12/2019 shows numerous bilateral irregular/spiculated pulmonary nodules measuring up to 14 mm, nonspecific.  Hepatic steatosis.  Spleen is normal. -I have talked to Dr.Ravandi at [facility] per patient request.  There  are clinical trials available upon progression of his AML.  There are also some clinical trials available now based on MRD positivity.  Patient not interested in moving to [place] at this time.   PLAN:  1.  AML: - He does not report any infections or hospitalizations in the last 6 weeks. - He does not report any GI side effects related to venetoclax or decitabine. - Reviewed labs today which showed hemoglobin 14.8 and white count 4.2 with normal differential.  Mild thrombocytopenia with platelets 130 stable.  LFTs and creatinine were within normal limits.  LDH was normal. - No evidence of recurrence of acute leukemia at this time. - We will proceed with next cycle of decitabine at 15 mg/m2  dose reduction. - He will continue venetoclax 200 mg starting today for 2 weeks and stop it. - RTC 6 weeks for follow-up with repeat labs and treatment.   2.  Arthralgias: - Continue hydrocodone 10/325 1 tablet every 6 hours as needed.   3.  Hypomagnesemia: - Continue magnesium 3 times daily.  Magnesium today is 1.9.   4.  Hypertension: - Continue Norvasc 2.5 mg daily.  Systolic blood pressure is 126.  5.  Dementia: - Continue Aricept 10 mg daily.  6.  Peripheral neuropathy: - He reported numbness in the fingertips for the past 3 months.  This has been intermittent.  He has difficulty making grip because of arthritis in the fingers.  He has on and off chronic numbness in the feet which has been stable. - No intervention needed at this time.   Orders placed this encounter:  No orders of the defined types were placed in this encounter.    [name], MD [place] [phone]   I, Thana Ates, am acting as a scribe for Dr. [name].  I, [name] MD, have reviewed the above documentation for accuracy and completeness, and I agree with the above.

## 2021-01-24 ENCOUNTER — Inpatient Hospital Stay (HOSPITAL_COMMUNITY): Payer: Medicare Other

## 2021-01-24 ENCOUNTER — Other Ambulatory Visit: Payer: Self-pay

## 2021-01-24 ENCOUNTER — Inpatient Hospital Stay (HOSPITAL_COMMUNITY): Payer: Medicare Other | Attending: Hematology | Admitting: Hematology

## 2021-01-24 VITALS — BP 126/72 | HR 68 | Temp 96.7°F | Resp 18 | Wt 215.2 lb

## 2021-01-24 VITALS — BP 132/78 | HR 64 | Temp 96.8°F | Resp 18

## 2021-01-24 DIAGNOSIS — M2550 Pain in unspecified joint: Secondary | ICD-10-CM | POA: Insufficient documentation

## 2021-01-24 DIAGNOSIS — Z5111 Encounter for antineoplastic chemotherapy: Secondary | ICD-10-CM | POA: Insufficient documentation

## 2021-01-24 DIAGNOSIS — C9201 Acute myeloblastic leukemia, in remission: Secondary | ICD-10-CM

## 2021-01-24 DIAGNOSIS — Z79899 Other long term (current) drug therapy: Secondary | ICD-10-CM | POA: Insufficient documentation

## 2021-01-24 DIAGNOSIS — C9200 Acute myeloblastic leukemia, not having achieved remission: Secondary | ICD-10-CM | POA: Diagnosis present

## 2021-01-24 DIAGNOSIS — D46Z Other myelodysplastic syndromes: Secondary | ICD-10-CM

## 2021-01-24 DIAGNOSIS — G629 Polyneuropathy, unspecified: Secondary | ICD-10-CM | POA: Diagnosis not present

## 2021-01-24 DIAGNOSIS — Z95828 Presence of other vascular implants and grafts: Secondary | ICD-10-CM

## 2021-01-24 DIAGNOSIS — D696 Thrombocytopenia, unspecified: Secondary | ICD-10-CM | POA: Insufficient documentation

## 2021-01-24 LAB — COMPREHENSIVE METABOLIC PANEL
ALT: 26 U/L (ref 0–44)
AST: 22 U/L (ref 15–41)
Albumin: 3.9 g/dL (ref 3.5–5.0)
Alkaline Phosphatase: 74 U/L (ref 38–126)
Anion gap: 5 (ref 5–15)
BUN: 20 mg/dL (ref 8–23)
CO2: 28 mmol/L (ref 22–32)
Calcium: 8.9 mg/dL (ref 8.9–10.3)
Chloride: 105 mmol/L (ref 98–111)
Creatinine, Ser: 1.07 mg/dL (ref 0.61–1.24)
GFR, Estimated: >60 mL/min (ref >60)
Glucose, Bld: 111 mg/dL — ABNORMAL HIGH (ref 70–99)
Potassium: 4.8 mmol/L (ref 3.5–5.1)
Sodium: 138 mmol/L (ref 135–145)
Total Bilirubin: 0.6 mg/dL (ref 0.3–1.2)
Total Protein: 6.2 g/dL — ABNORMAL LOW (ref 6.5–8.1)

## 2021-01-24 LAB — CBC WITH DIFFERENTIAL/PLATELET
Abs Immature Granulocytes: 0.03 10*3/uL (ref 0.00–0.07)
Basophils Absolute: 0 10*3/uL (ref 0.0–0.1)
Basophils Relative: 1 %
Eosinophils Absolute: 0.1 10*3/uL (ref 0.0–0.5)
Eosinophils Relative: 2 %
HCT: 45.3 % (ref 39.0–52.0)
Hemoglobin: 14.8 g/dL (ref 13.0–17.0)
Immature Granulocytes: 1 %
Lymphocytes Relative: 20 %
Lymphs Abs: 0.8 10*3/uL (ref 0.7–4.0)
MCH: 31.4 pg (ref 26.0–34.0)
MCHC: 32.7 g/dL (ref 30.0–36.0)
MCV: 96 fL (ref 80.0–100.0)
Monocytes Absolute: 0.3 10*3/uL (ref 0.1–1.0)
Monocytes Relative: 6 %
Neutro Abs: 2.9 10*3/uL (ref 1.7–7.7)
Neutrophils Relative %: 70 %
Platelets: 130 10*3/uL — ABNORMAL LOW (ref 150–400)
RBC: 4.72 MIL/uL (ref 4.22–5.81)
RDW: 14.9 % (ref 11.5–15.5)
WBC: 4.2 10*3/uL (ref 4.0–10.5)
nRBC: 0 % (ref 0.0–0.2)

## 2021-01-24 LAB — LACTATE DEHYDROGENASE: LDH: 141 U/L (ref 98–192)

## 2021-01-24 LAB — MAGNESIUM: Magnesium: 1.9 mg/dL (ref 1.7–2.4)

## 2021-01-24 LAB — PHOSPHORUS: Phosphorus: 2.8 mg/dL (ref 2.5–4.6)

## 2021-01-24 LAB — URIC ACID: Uric Acid, Serum: 3.9 mg/dL (ref 3.7–8.6)

## 2021-01-24 MED ORDER — ONDANSETRON HCL 40 MG/20ML IJ SOLN
8.0000 mg | Freq: Once | INTRAMUSCULAR | Status: AC
  Administered 2021-01-24: 8 mg via INTRAVENOUS
  Filled 2021-01-24: qty 8

## 2021-01-24 MED ORDER — SODIUM CHLORIDE 0.9 % IV SOLN
15.0000 mg/m2 | Freq: Once | INTRAVENOUS | Status: AC
  Administered 2021-01-24: 35 mg via INTRAVENOUS
  Filled 2021-01-24: qty 7

## 2021-01-24 MED ORDER — SODIUM CHLORIDE 0.9% FLUSH
10.0000 mL | INTRAVENOUS | Status: DC | PRN
  Administered 2021-01-24: 10 mL

## 2021-01-24 MED ORDER — SODIUM CHLORIDE 0.9 % IV SOLN: Freq: Once | INTRAVENOUS | Status: AC

## 2021-01-24 MED ORDER — HEPARIN SOD (PORK) LOCK FLUSH 100 UNIT/ML IV SOLN
500.0000 [IU] | Freq: Once | INTRAVENOUS | Status: AC | PRN
  Administered 2021-01-24: 500 [IU]

## 2021-01-24 NOTE — Patient Instructions (Signed)
[facility]  Discharge Instructions: Thank you for choosing [facility] to provide your oncology and hematology care.  If you have a lab appointment with the [facility], please come in thru the Main Entrance and check in at the main information desk.  Wear comfortable clothing and clothing appropriate for easy access to any Portacath or PICC line.   We strive to give you quality time with your provider. You may need to reschedule your appointment if you arrive late (15 or more minutes).  Arriving late affects you and other patients whose appointments are after yours.  Also, if you miss three or more appointments without notifying the office, you may be dismissed from the clinic at the provider's discretion.      For prescription refill requests, have your pharmacy contact our office and allow 72 hours for refills to be completed.    Today you received the following chemotherapy and/or immunotherapy agents Decitabine      To help prevent nausea and vomiting after your treatment, we encourage you to take your nausea medication as directed.  BELOW ARE SYMPTOMS THAT SHOULD BE REPORTED IMMEDIATELY: *FEVER GREATER THAN 100.4 F (38 C) OR HIGHER *CHILLS OR SWEATING *NAUSEA AND VOMITING THAT IS NOT CONTROLLED WITH YOUR NAUSEA MEDICATION *UNUSUAL SHORTNESS OF BREATH *UNUSUAL BRUISING OR BLEEDING *URINARY PROBLEMS (pain or burning when urinating, or frequent urination) *BOWEL PROBLEMS (unusual diarrhea, constipation, pain near the anus) TENDERNESS IN MOUTH AND THROAT WITH OR WITHOUT PRESENCE OF ULCERS (sore throat, sores in mouth, or a toothache) UNUSUAL RASH, SWELLING OR PAIN  UNUSUAL VAGINAL DISCHARGE OR ITCHING   Items with * indicate a potential emergency and should be followed up as soon as possible or go to the Emergency Department if any problems should occur.  Please show the CHEMOTHERAPY ALERT CARD or IMMUNOTHERAPY ALERT CARD at check-in to the Emergency  Department and triage nurse.  Should you have questions after your visit or need to cancel or reschedule your appointment, please contact [facility] [phone]  and follow the prompts.  Office hours are 8:00 a.m. to 4:30 p.m. Monday - Friday. Please note that voicemails left after 4:00 p.m. may not be returned until the following business day.  We are closed weekends and major holidays. You have access to a nurse at all times for urgent questions. Please call the main number to the clinic [phone] and follow the prompts.  For any non-urgent questions, you may also contact your provider using MyChart. We now offer e-Visits for anyone 45 and older to request care online for non-urgent symptoms. For details visit [email or web address].   Also download the MyChart app! Go to the app store, search "MyChart", open the app, select [facility], and log in with your MyChart username and password.  Due to Covid, a mask is required upon entering the hospital/clinic. If you do not have a mask, one will be given to you upon arrival. For doctor visits, patients may have 1 support person aged 84 or older with them. For treatment visits, patients cannot have anyone with them due to current Covid guidelines and our immunocompromised population.

## 2021-01-24 NOTE — Patient Instructions (Addendum)
[facility] Discharge Instructions  You were seen today by Dr. Katragadda. He went over your recent results, and you received your treatment. Dr. Katragadda will see you back in 6 weeks for labs and follow up.   Thank you for choosing [facility] at [facility] to provide your oncology and hematology care.  To afford each patient quality time with our provider, please arrive at least 15 minutes before your scheduled appointment time.   If you have a lab appointment with the Cancer Center please come in thru the Main Entrance and check in at the main information desk  You need to re-schedule your appointment should you arrive 10 or more minutes late.  We strive to give you quality time with our providers, and arriving late affects you and other patients whose appointments are after yours.  Also, if you no show three or more times for appointments you may be dismissed from the clinic at the providers discretion.     Again, thank you for choosing [facility].  Our hope is that these requests will decrease the amount of time that you wait before being seen by our physicians.       _____________________________________________________________  Should you have questions after your visit to [facility], please contact our office at [phone] between the hours of 8:00 a.m. and 4:30 p.m.  Voicemails left after 4:00 p.m. will not be returned until the following business day.  For prescription refill requests, have your pharmacy contact our office and allow 72 hours.    Cancer Center Support Programs:   > Cancer Support Group  2nd Tuesday of the month 1pm-2pm, Journey Room   

## 2021-01-24 NOTE — Progress Notes (Signed)
Patient presents today for Decitabine infusion per providers order.  Vital signs and labs within parameters for treatment.  Patient only complains of some new neuropathy in his feet.  Advised the patient to discuss with Dr. Delton Coombes during his office visit.  Okay for treatment per Dr.Katragadda.  Decitabine infusion given today per MD orders.  Stable during infusion without adverse affects.  Vital signs stable.  No complaints at this time.  Discharge from clinic via motorized wheelchair in stable condition.  Alert and oriented X 3.  Follow up with [facility] as scheduled.

## 2021-01-24 NOTE — Progress Notes (Signed)
Patient is taking Venetoclax as prescribed.  They have not missed any doses and report no side effects at this time.    Patient has been examined, vital signs and labs have been reviewed by Dr. Delton Coombes. ANC, Creatinine, LFTs, hemoglobin, and platelets are within treatment parameters per Dr. Delton Coombes. Patient is okay to proceed with treatment per M.D.

## 2021-01-25 ENCOUNTER — Inpatient Hospital Stay (HOSPITAL_COMMUNITY): Payer: Medicare Other

## 2021-01-25 VITALS — BP 127/78 | HR 60 | Temp 96.9°F | Resp 18

## 2021-01-25 DIAGNOSIS — Z95828 Presence of other vascular implants and grafts: Secondary | ICD-10-CM

## 2021-01-25 DIAGNOSIS — C9200 Acute myeloblastic leukemia, not having achieved remission: Secondary | ICD-10-CM

## 2021-01-25 DIAGNOSIS — D46Z Other myelodysplastic syndromes: Secondary | ICD-10-CM

## 2021-01-25 DIAGNOSIS — Z5111 Encounter for antineoplastic chemotherapy: Secondary | ICD-10-CM | POA: Diagnosis not present

## 2021-01-25 MED ORDER — SODIUM CHLORIDE 0.9 % IV SOLN
15.0000 mg/m2 | Freq: Once | INTRAVENOUS | Status: AC
  Administered 2021-01-25: 35 mg via INTRAVENOUS
  Filled 2021-01-25: qty 7

## 2021-01-25 MED ORDER — SODIUM CHLORIDE 0.9% FLUSH
10.0000 mL | INTRAVENOUS | Status: DC | PRN
  Administered 2021-01-25: 10 mL

## 2021-01-25 MED ORDER — SODIUM CHLORIDE 0.9 % IV SOLN
8.0000 mg | Freq: Once | INTRAVENOUS | Status: AC
  Administered 2021-01-25: 8 mg via INTRAVENOUS
  Filled 2021-01-25: qty 8

## 2021-01-25 MED ORDER — SODIUM CHLORIDE 0.9 % IV SOLN: Freq: Once | INTRAVENOUS | Status: AC

## 2021-01-25 MED ORDER — HEPARIN SOD (PORK) LOCK FLUSH 100 UNIT/ML IV SOLN
500.0000 [IU] | Freq: Once | INTRAVENOUS | Status: AC | PRN
  Administered 2021-01-25: 500 [IU]

## 2021-01-25 NOTE — Progress Notes (Signed)
Patient presents today for Day 2 Decitabine infusion per providers order.  Vital signs within parameters for treatment.  Decitabine infusion given today per MD orders.  Stable during infusion without adverse affects.  Vital signs stable.  No complaints at this time.  Discharge from clinic ambulatory in stable condition.  Alert and oriented X 3.  Follow up with [facility] as scheduled.

## 2021-01-25 NOTE — Patient Instructions (Signed)
[facility]  Discharge Instructions: Thank you for choosing [facility] to provide your oncology and hematology care.  If you have a lab appointment with the [facility], please come in thru the Main Entrance and check in at the main information desk.  Wear comfortable clothing and clothing appropriate for easy access to any Portacath or PICC line.   We strive to give you quality time with your provider. You may need to reschedule your appointment if you arrive late (15 or more minutes).  Arriving late affects you and other patients whose appointments are after yours.  Also, if you miss three or more appointments without notifying the office, you may be dismissed from the clinic at the provider's discretion.      For prescription refill requests, have your pharmacy contact our office and allow 72 hours for refills to be completed.    Today you received the following chemotherapy and/or immunotherapy agents Decitabine      To help prevent nausea and vomiting after your treatment, we encourage you to take your nausea medication as directed.  BELOW ARE SYMPTOMS THAT SHOULD BE REPORTED IMMEDIATELY: *FEVER GREATER THAN 100.4 F (38 C) OR HIGHER *CHILLS OR SWEATING *NAUSEA AND VOMITING THAT IS NOT CONTROLLED WITH YOUR NAUSEA MEDICATION *UNUSUAL SHORTNESS OF BREATH *UNUSUAL BRUISING OR BLEEDING *URINARY PROBLEMS (pain or burning when urinating, or frequent urination) *BOWEL PROBLEMS (unusual diarrhea, constipation, pain near the anus) TENDERNESS IN MOUTH AND THROAT WITH OR WITHOUT PRESENCE OF ULCERS (sore throat, sores in mouth, or a toothache) UNUSUAL RASH, SWELLING OR PAIN  UNUSUAL VAGINAL DISCHARGE OR ITCHING   Items with * indicate a potential emergency and should be followed up as soon as possible or go to the Emergency Department if any problems should occur.  Please show the CHEMOTHERAPY ALERT CARD or IMMUNOTHERAPY ALERT CARD at check-in to the Emergency  Department and triage nurse.  Should you have questions after your visit or need to cancel or reschedule your appointment, please contact [facility] [phone]  and follow the prompts.  Office hours are 8:00 a.m. to 4:30 p.m. Monday - Friday. Please note that voicemails left after 4:00 p.m. may not be returned until the following business day.  We are closed weekends and major holidays. You have access to a nurse at all times for urgent questions. Please call the main number to the clinic [phone] and follow the prompts.  For any non-urgent questions, you may also contact your provider using MyChart. We now offer e-Visits for anyone 45 and older to request care online for non-urgent symptoms. For details visit [email or web address].   Also download the MyChart app! Go to the app store, search "MyChart", open the app, select [facility], and log in with your MyChart username and password.  Due to Covid, a mask is required upon entering the hospital/clinic. If you do not have a mask, one will be given to you upon arrival. For doctor visits, patients may have 1 support person aged 84 or older with them. For treatment visits, patients cannot have anyone with them due to current Covid guidelines and our immunocompromised population.

## 2021-01-26 ENCOUNTER — Other Ambulatory Visit: Payer: Self-pay

## 2021-01-26 ENCOUNTER — Encounter (HOSPITAL_COMMUNITY): Payer: Self-pay

## 2021-01-26 ENCOUNTER — Inpatient Hospital Stay (HOSPITAL_COMMUNITY): Payer: Medicare Other

## 2021-01-26 VITALS — BP 136/82 | HR 60 | Temp 97.0°F | Resp 18

## 2021-01-26 DIAGNOSIS — D46Z Other myelodysplastic syndromes: Secondary | ICD-10-CM

## 2021-01-26 DIAGNOSIS — C9200 Acute myeloblastic leukemia, not having achieved remission: Secondary | ICD-10-CM

## 2021-01-26 DIAGNOSIS — Z95828 Presence of other vascular implants and grafts: Secondary | ICD-10-CM

## 2021-01-26 DIAGNOSIS — Z5111 Encounter for antineoplastic chemotherapy: Secondary | ICD-10-CM | POA: Diagnosis not present

## 2021-01-26 MED ORDER — SODIUM CHLORIDE 0.9% FLUSH
10.0000 mL | INTRAVENOUS | Status: DC | PRN
  Administered 2021-01-26: 10 mL

## 2021-01-26 MED ORDER — HEPARIN SOD (PORK) LOCK FLUSH 100 UNIT/ML IV SOLN
500.0000 [IU] | Freq: Once | INTRAVENOUS | Status: AC | PRN
  Administered 2021-01-26: 500 [IU]

## 2021-01-26 MED ORDER — SODIUM CHLORIDE 0.9 % IV SOLN
8.0000 mg | Freq: Once | INTRAVENOUS | Status: AC
  Administered 2021-01-26: 8 mg via INTRAVENOUS
  Filled 2021-01-26: qty 8

## 2021-01-26 MED ORDER — SODIUM CHLORIDE 0.9 % IV SOLN
15.0000 mg/m2 | Freq: Once | INTRAVENOUS | Status: AC
  Administered 2021-01-26: 35 mg via INTRAVENOUS
  Filled 2021-01-26: qty 7

## 2021-01-26 MED ORDER — SODIUM CHLORIDE 0.9 % IV SOLN: Freq: Once | INTRAVENOUS | Status: AC

## 2021-01-26 NOTE — Progress Notes (Signed)
Pt here for D3 of decitabine. No complaints today. Tolerated treatment well today without incidence.  AVS reviewed.  Stable during and after treatment. Discharged in stable condition via wheelchair.

## 2021-01-26 NOTE — Patient Instructions (Signed)
[facility]  Discharge Instructions: Thank you for choosing [facility] to provide your oncology and hematology care.  If you have a lab appointment with the [facility], please come in thru the Main Entrance and check in at the main information desk.  Wear comfortable clothing and clothing appropriate for easy access to any Portacath or PICC line.   We strive to give you quality time with your provider. You may need to reschedule your appointment if you arrive late (15 or more minutes).  Arriving late affects you and other patients whose appointments are after yours.  Also, if you miss three or more appointments without notifying the office, you may be dismissed from the clinic at the provider's discretion.      For prescription refill requests, have your pharmacy contact our office and allow 72 hours for refills to be completed.    Today you received the following chemotherapy and/or immunotherapy agents decitabine      To help prevent nausea and vomiting after your treatment, we encourage you to take your nausea medication as directed.  BELOW ARE SYMPTOMS THAT SHOULD BE REPORTED IMMEDIATELY: *FEVER GREATER THAN 100.4 F (38 C) OR HIGHER *CHILLS OR SWEATING *NAUSEA AND VOMITING THAT IS NOT CONTROLLED WITH YOUR NAUSEA MEDICATION *UNUSUAL SHORTNESS OF BREATH *UNUSUAL BRUISING OR BLEEDING *URINARY PROBLEMS (pain or burning when urinating, or frequent urination) *BOWEL PROBLEMS (unusual diarrhea, constipation, pain near the anus) TENDERNESS IN MOUTH AND THROAT WITH OR WITHOUT PRESENCE OF ULCERS (sore throat, sores in mouth, or a toothache) UNUSUAL RASH, SWELLING OR PAIN  UNUSUAL VAGINAL DISCHARGE OR ITCHING   Items with * indicate a potential emergency and should be followed up as soon as possible or go to the Emergency Department if any problems should occur.  Please show the CHEMOTHERAPY ALERT CARD or IMMUNOTHERAPY ALERT CARD at check-in to the Emergency  Department and triage nurse.  Should you have questions after your visit or need to cancel or reschedule your appointment, please contact [facility] [phone]  and follow the prompts.  Office hours are 8:00 a.m. to 4:30 p.m. Monday - Friday. Please note that voicemails left after 4:00 p.m. may not be returned until the following business day.  We are closed weekends and major holidays. You have access to a nurse at all times for urgent questions. Please call the main number to the clinic [phone] and follow the prompts.  For any non-urgent questions, you may also contact your provider using MyChart. We now offer e-Visits for anyone 71 and older to request care online for non-urgent symptoms. For details visit [email or web address].   Also download the MyChart app! Go to the app store, search "MyChart", open the app, select [facility], and log in with your MyChart username and password.  Due to Covid, a mask is required upon entering the hospital/clinic. If you do not have a mask, one will be given to you upon arrival. For doctor visits, patients may have 1 support person aged 54 or older with them. For treatment visits, patients cannot have anyone with them due to current Covid guidelines and our immunocompromised population.     Decitabine injection for infusion What is this medication? DECITABINE (dee SYE ta been) is a chemotherapy drug. This medicine reduces the growth of cancer cells. It is used to treat adults with myelodysplastic syndromes. This medicine may be used for other purposes; ask your health care provider or pharmacist if you have questions. COMMON BRAND NAME(S):  Dacogen What should I tell my care team before I take this medication? They need to know if you have any of these conditions: infection (especially a virus infection such as chickenpox, cold sores, or herpes) kidney disease liver disease an unusual or allergic reaction to decitabine, other  medicines, foods, dyes, or preservatives pregnant or trying to get pregnant breast-feeding How should I use this medication? This medicine is for infusion into a vein. It is administered in a hospital or clinic by a doctor or health care professional. Talk to your pediatrician regarding the use of this medicine in children. Special care may be needed. Overdosage: If you think you have taken too much of this medicine contact a poison control center or emergency room at once. NOTE: This medicine is only for you. Do not share this medicine with others. What if I miss a dose? It is important not to miss your dose. Call your doctor or health care professional if you are unable to keep an appointment. What may interact with this medication? vaccines Talk to your doctor or health care professional before taking any of these medicines: aspirin acetaminophen ibuprofen ketoprofen naproxen This list may not describe all possible interactions. Give your health care provider a list of all the medicines, herbs, non-prescription drugs, or dietary supplements you use. Also tell them if you smoke, drink alcohol, or use illegal drugs. Some items may interact with your medicine. What should I watch for while using this medication? Visit your doctor for checks on your progress. This drug may make you feel generally unwell. This is not uncommon, as chemotherapy can affect healthy cells as well as cancer cells. Report any side effects. Continue your course of treatment even though you feel ill unless your doctor tells you to stop. You may need blood work done while you are taking this medicine. In some cases, you may be given additional medicines to help with side effects. Follow all directions for their use. Call your doctor or health care professional for advice if you get a fever, chills or sore throat, or other symptoms of a cold or flu. Do not treat yourself. This drug decreases your body's ability to fight  infections. Try to avoid being around people who are sick. This medicine may increase your risk to bruise or bleed. Call your doctor or health care professional if you notice any unusual bleeding. Do not become pregnant while taking this medicine or for 6 months after stopping it. Women should inform their doctor if they wish to become pregnant or think they might be pregnant. Men should not father a child while taking this medicine and for 3 months after stopping it. There is a potential for serious side effects to an unborn child. Talk to your health care professional or pharmacist for more information. Do not breast-feed an infant while taking this medicine or for at least 2 weeks after stopping it. In males, this medicine may interfere with the ability to father a child. Talk with your doctor or health care professional if you are concerned about your fertility. What side effects may I notice from receiving this medication? Side effects that you should report to your doctor or health care professional as soon as possible: low blood counts - this medicine may decrease the number of white blood cells, red blood cells and platelets. You may be at increased risk for infections and bleeding. signs of infection - fever or chills, cough, sore throat, pain or difficulty passing urine signs of  decreased platelets or bleeding - bruising, pinpoint red spots on the skin, black, tarry stools, blood in the urine signs of decreased red blood cells - unusual weakness or tiredness, fainting spells, lightheadedness increased blood sugar Side effects that usually do not require medical attention (report to your doctor or health care professional if they continue or are bothersome): constipation diarrhea headache loss of appetite nausea, vomiting skin rash, itching stomach pain water retention weak or tired This list may not describe all possible side effects. Call your doctor for medical advice about side  effects. You may report side effects to FDA at 1-800-FDA-1088. Where should I keep my medication? This drug is given in a hospital or clinic and will not be stored at home. NOTE: This sheet is a summary. It may not cover all possible information. If you have questions about this medicine, talk to your doctor, pharmacist, or health care provider.  2022 Elsevier/Gold Standard (2018-07-05 13:32:17)

## 2021-01-27 ENCOUNTER — Inpatient Hospital Stay (HOSPITAL_COMMUNITY): Payer: Medicare Other

## 2021-01-27 ENCOUNTER — Encounter (HOSPITAL_COMMUNITY): Payer: Self-pay

## 2021-01-27 VITALS — BP 138/73 | HR 62 | Temp 97.9°F | Resp 18

## 2021-01-27 DIAGNOSIS — C9200 Acute myeloblastic leukemia, not having achieved remission: Secondary | ICD-10-CM

## 2021-01-27 DIAGNOSIS — Z95828 Presence of other vascular implants and grafts: Secondary | ICD-10-CM

## 2021-01-27 DIAGNOSIS — Z5111 Encounter for antineoplastic chemotherapy: Secondary | ICD-10-CM | POA: Diagnosis not present

## 2021-01-27 DIAGNOSIS — D46Z Other myelodysplastic syndromes: Secondary | ICD-10-CM

## 2021-01-27 MED ORDER — SODIUM CHLORIDE 0.9 % IV SOLN: Freq: Once | INTRAVENOUS | Status: AC

## 2021-01-27 MED ORDER — SODIUM CHLORIDE 0.9 % IV SOLN
15.0000 mg/m2 | Freq: Once | INTRAVENOUS | Status: AC
  Administered 2021-01-27: 35 mg via INTRAVENOUS
  Filled 2021-01-27: qty 7

## 2021-01-27 MED ORDER — SODIUM CHLORIDE 0.9 % IV SOLN
8.0000 mg | Freq: Once | INTRAVENOUS | Status: AC
  Administered 2021-01-27: 8 mg via INTRAVENOUS
  Filled 2021-01-27: qty 8

## 2021-01-27 MED ORDER — SODIUM CHLORIDE 0.9% FLUSH
10.0000 mL | INTRAVENOUS | Status: DC | PRN
  Administered 2021-01-27: 10 mL

## 2021-01-27 MED ORDER — HEPARIN SOD (PORK) LOCK FLUSH 100 UNIT/ML IV SOLN
500.0000 [IU] | Freq: Once | INTRAVENOUS | Status: AC | PRN
  Administered 2021-01-27: 500 [IU]

## 2021-01-27 MED FILL — Ondansetron HCl Inj 40 MG/20ML (2 MG/ML): INTRAMUSCULAR | Qty: 4 | Status: AC

## 2021-01-27 NOTE — Progress Notes (Signed)
Pt presents today for D4 Decitabine per provider's order. Vital signs stable for treatment and pt voiced no new complaints at this time.  D4 Decitabine given today per MD orders. Tolerated infusion without adverse affects. Vital signs stable. No complaints at this time. Discharged from clinic via motorized chair in stable condition. Alert and oriented x 3. F/U with [facility] as scheduled.

## 2021-01-27 NOTE — Patient Instructions (Signed)
[facility]  Discharge Instructions: Thank you for choosing [facility] to provide your oncology and hematology care.  If you have a lab appointment with the [facility], please come in thru the Main Entrance and check in at the main information desk.  Wear comfortable clothing and clothing appropriate for easy access to any Portacath or PICC line.   We strive to give you quality time with your provider. You may need to reschedule your appointment if you arrive late (15 or more minutes).  Arriving late affects you and other patients whose appointments are after yours.  Also, if you miss three or more appointments without notifying the office, you may be dismissed from the clinic at the provider's discretion.      For prescription refill requests, have your pharmacy contact our office and allow 72 hours for refills to be completed.    Today you received the following chemotherapy and/or immunotherapy agents D4 Decitabine.   To help prevent nausea and vomiting after your treatment, we encourage you to take your nausea medication as directed.  BELOW ARE SYMPTOMS THAT SHOULD BE REPORTED IMMEDIATELY: *FEVER GREATER THAN 100.4 F (38 C) OR HIGHER *CHILLS OR SWEATING *NAUSEA AND VOMITING THAT IS NOT CONTROLLED WITH YOUR NAUSEA MEDICATION *UNUSUAL SHORTNESS OF BREATH *UNUSUAL BRUISING OR BLEEDING *URINARY PROBLEMS (pain or burning when urinating, or frequent urination) *BOWEL PROBLEMS (unusual diarrhea, constipation, pain near the anus) TENDERNESS IN MOUTH AND THROAT WITH OR WITHOUT PRESENCE OF ULCERS (sore throat, sores in mouth, or a toothache) UNUSUAL RASH, SWELLING OR PAIN  UNUSUAL VAGINAL DISCHARGE OR ITCHING   Items with * indicate a potential emergency and should be followed up as soon as possible or go to the Emergency Department if any problems should occur.  Please show the CHEMOTHERAPY ALERT CARD or IMMUNOTHERAPY ALERT CARD at check-in to the Emergency  Department and triage nurse.  Should you have questions after your visit or need to cancel or reschedule your appointment, please contact [facility] [phone]  and follow the prompts.  Office hours are 8:00 a.m. to 4:30 p.m. Monday - Friday. Please note that voicemails left after 4:00 p.m. may not be returned until the following business day.  We are closed weekends and major holidays. You have access to a nurse at all times for urgent questions. Please call the main number to the clinic [phone] and follow the prompts.  For any non-urgent questions, you may also contact your provider using MyChart. We now offer e-Visits for anyone 33 and older to request care online for non-urgent symptoms. For details visit [email or web address].   Also download the MyChart app! Go to the app store, search "MyChart", open the app, select [facility], and log in with your MyChart username and password.  Due to Covid, a mask is required upon entering the hospital/clinic. If you do not have a mask, one will be given to you upon arrival. For doctor visits, patients may have 1 support person aged 68 or older with them. For treatment visits, patients cannot have anyone with them due to current Covid guidelines and our immunocompromised population.

## 2021-01-28 ENCOUNTER — Other Ambulatory Visit: Payer: Self-pay

## 2021-01-28 ENCOUNTER — Inpatient Hospital Stay (HOSPITAL_COMMUNITY): Payer: Medicare Other

## 2021-01-28 VITALS — BP 130/74 | HR 62 | Temp 96.8°F | Resp 18

## 2021-01-28 DIAGNOSIS — D46Z Other myelodysplastic syndromes: Secondary | ICD-10-CM

## 2021-01-28 DIAGNOSIS — Z95828 Presence of other vascular implants and grafts: Secondary | ICD-10-CM

## 2021-01-28 DIAGNOSIS — Z5111 Encounter for antineoplastic chemotherapy: Secondary | ICD-10-CM | POA: Diagnosis not present

## 2021-01-28 DIAGNOSIS — C9200 Acute myeloblastic leukemia, not having achieved remission: Secondary | ICD-10-CM

## 2021-01-28 MED ORDER — SODIUM CHLORIDE 0.9 % IV SOLN
15.0000 mg/m2 | Freq: Once | INTRAVENOUS | Status: AC
  Administered 2021-01-28: 35 mg via INTRAVENOUS
  Filled 2021-01-28: qty 7

## 2021-01-28 MED ORDER — SODIUM CHLORIDE 0.9 % IV SOLN
Freq: Once | INTRAVENOUS | Status: AC
Start: 2021-01-28 — End: 2021-01-28

## 2021-01-28 MED ORDER — SODIUM CHLORIDE 0.9 % IV SOLN
8.0000 mg | Freq: Once | INTRAVENOUS | Status: AC
  Administered 2021-01-28: 8 mg via INTRAVENOUS
  Filled 2021-01-28: qty 8

## 2021-01-28 MED ORDER — SODIUM CHLORIDE 0.9% FLUSH
10.0000 mL | INTRAVENOUS | Status: DC | PRN
  Administered 2021-01-28: 10 mL

## 2021-01-28 MED ORDER — HEPARIN SOD (PORK) LOCK FLUSH 100 UNIT/ML IV SOLN
500.0000 [IU] | Freq: Once | INTRAVENOUS | Status: AC | PRN
  Administered 2021-01-28: 500 [IU]

## 2021-01-28 NOTE — Patient Instructions (Signed)
[facility]  Discharge Instructions: Thank you for choosing [facility] to provide your oncology and hematology care.  If you have a lab appointment with the [facility], please come in thru the Main Entrance and check in at the main information desk.  Wear comfortable clothing and clothing appropriate for easy access to any Portacath or PICC line.   We strive to give you quality time with your provider. You may need to reschedule your appointment if you arrive late (15 or more minutes).  Arriving late affects you and other patients whose appointments are after yours.  Also, if you miss three or more appointments without notifying the office, you may be dismissed from the clinic at the provider's discretion.      For prescription refill requests, have your pharmacy contact our office and allow 72 hours for refills to be completed.    Today you received the following chemotherapy and/or immunotherapy agents Decitabine      To help prevent nausea and vomiting after your treatment, we encourage you to take your nausea medication as directed.  BELOW ARE SYMPTOMS THAT SHOULD BE REPORTED IMMEDIATELY: *FEVER GREATER THAN 100.4 F (38 C) OR HIGHER *CHILLS OR SWEATING *NAUSEA AND VOMITING THAT IS NOT CONTROLLED WITH YOUR NAUSEA MEDICATION *UNUSUAL SHORTNESS OF BREATH *UNUSUAL BRUISING OR BLEEDING *URINARY PROBLEMS (pain or burning when urinating, or frequent urination) *BOWEL PROBLEMS (unusual diarrhea, constipation, pain near the anus) TENDERNESS IN MOUTH AND THROAT WITH OR WITHOUT PRESENCE OF ULCERS (sore throat, sores in mouth, or a toothache) UNUSUAL RASH, SWELLING OR PAIN  UNUSUAL VAGINAL DISCHARGE OR ITCHING   Items with * indicate a potential emergency and should be followed up as soon as possible or go to the Emergency Department if any problems should occur.  Please show the CHEMOTHERAPY ALERT CARD or IMMUNOTHERAPY ALERT CARD at check-in to the Emergency  Department and triage nurse.  Should you have questions after your visit or need to cancel or reschedule your appointment, please contact [facility] [phone]  and follow the prompts.  Office hours are 8:00 a.m. to 4:30 p.m. Monday - Friday. Please note that voicemails left after 4:00 p.m. may not be returned until the following business day.  We are closed weekends and major holidays. You have access to a nurse at all times for urgent questions. Please call the main number to the clinic [phone] and follow the prompts.  For any non-urgent questions, you may also contact your provider using MyChart. We now offer e-Visits for anyone 45 and older to request care online for non-urgent symptoms. For details visit [email or web address].   Also download the MyChart app! Go to the app store, search "MyChart", open the app, select [facility], and log in with your MyChart username and password.  Due to Covid, a mask is required upon entering the hospital/clinic. If you do not have a mask, one will be given to you upon arrival. For doctor visits, patients may have 1 support person aged 84 or older with them. For treatment visits, patients cannot have anyone with them due to current Covid guidelines and our immunocompromised population.

## 2021-01-28 NOTE — Progress Notes (Signed)
Patient presents today for Decitabine day five per providers order.  Vital signs within parameters for treatment.  Patient has no new complaints at this time.  Decitabine infusion given today per MD orders.  Stable during infusion without adverse affects.  Vital signs stable.  No complaints at this time.  Discharge from clinic ambulatory in stable condition.  Alert and oriented X 3.  Follow up with [facility] as scheduled.

## 2021-02-07 ENCOUNTER — Other Ambulatory Visit (HOSPITAL_COMMUNITY): Payer: Self-pay

## 2021-02-08 ENCOUNTER — Other Ambulatory Visit (HOSPITAL_COMMUNITY): Payer: Self-pay | Admitting: *Deleted

## 2021-02-08 MED ORDER — HYDROCODONE-ACETAMINOPHEN 10-325 MG PO TABS: 1.0000 | ORAL_TABLET | Freq: Four times a day (QID) | ORAL | 0 refills | Status: DC | PRN

## 2021-02-13 ENCOUNTER — Other Ambulatory Visit (HOSPITAL_COMMUNITY): Payer: Self-pay | Admitting: Hematology

## 2021-02-14 ENCOUNTER — Encounter (HOSPITAL_COMMUNITY): Payer: Self-pay | Admitting: Hematology

## 2021-02-19 IMAGING — CR PORTABLE CHEST - 1 VIEW
1 series · 1 of 1 positions shown · non-contrast
Comparison: 06/21/2018

CLINICAL DATA: Port-A-Cath placement.

EXAM:
PORTABLE CHEST 1 VIEW

[portable]
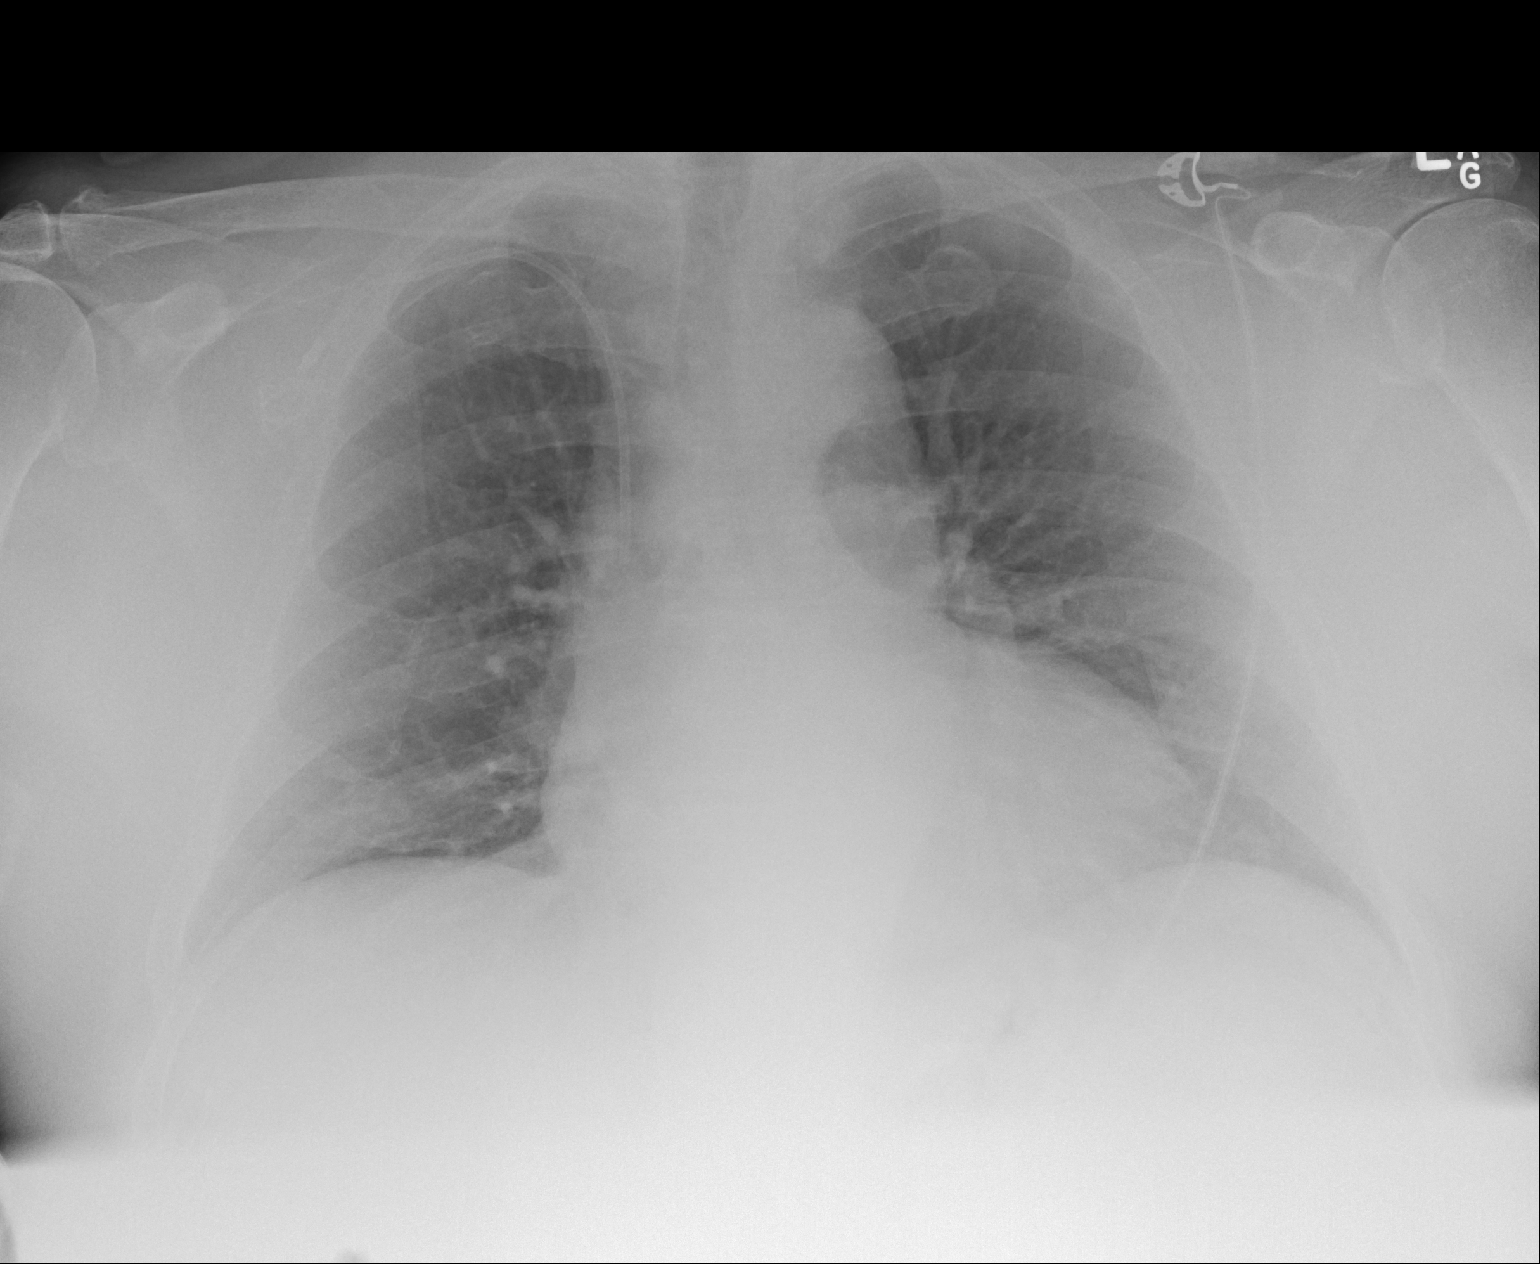

[1 of 1 positions shown; findings below may reference images not displayed]

FINDINGS: Right-sided power port is seen with tip overlying the distal SVC. No
evidence of pneumothorax. Both lungs are clear. Heart size is within
normal limits allowing for low lung volumes.
IMPRESSION: Right-sided power port in appropriate position. No evidence of
pneumothorax or other acute findings.

## 2021-02-28 IMAGING — DX CHEST - 2 VIEW
2 series · 2 of 2 positions shown · non-contrast
Comparison: 06/21/2018, 08/19/2018

CLINICAL DATA: 72-year-old male with a history of cancer and fever

EXAM:
CHEST - 2 VIEW

[chest pa]
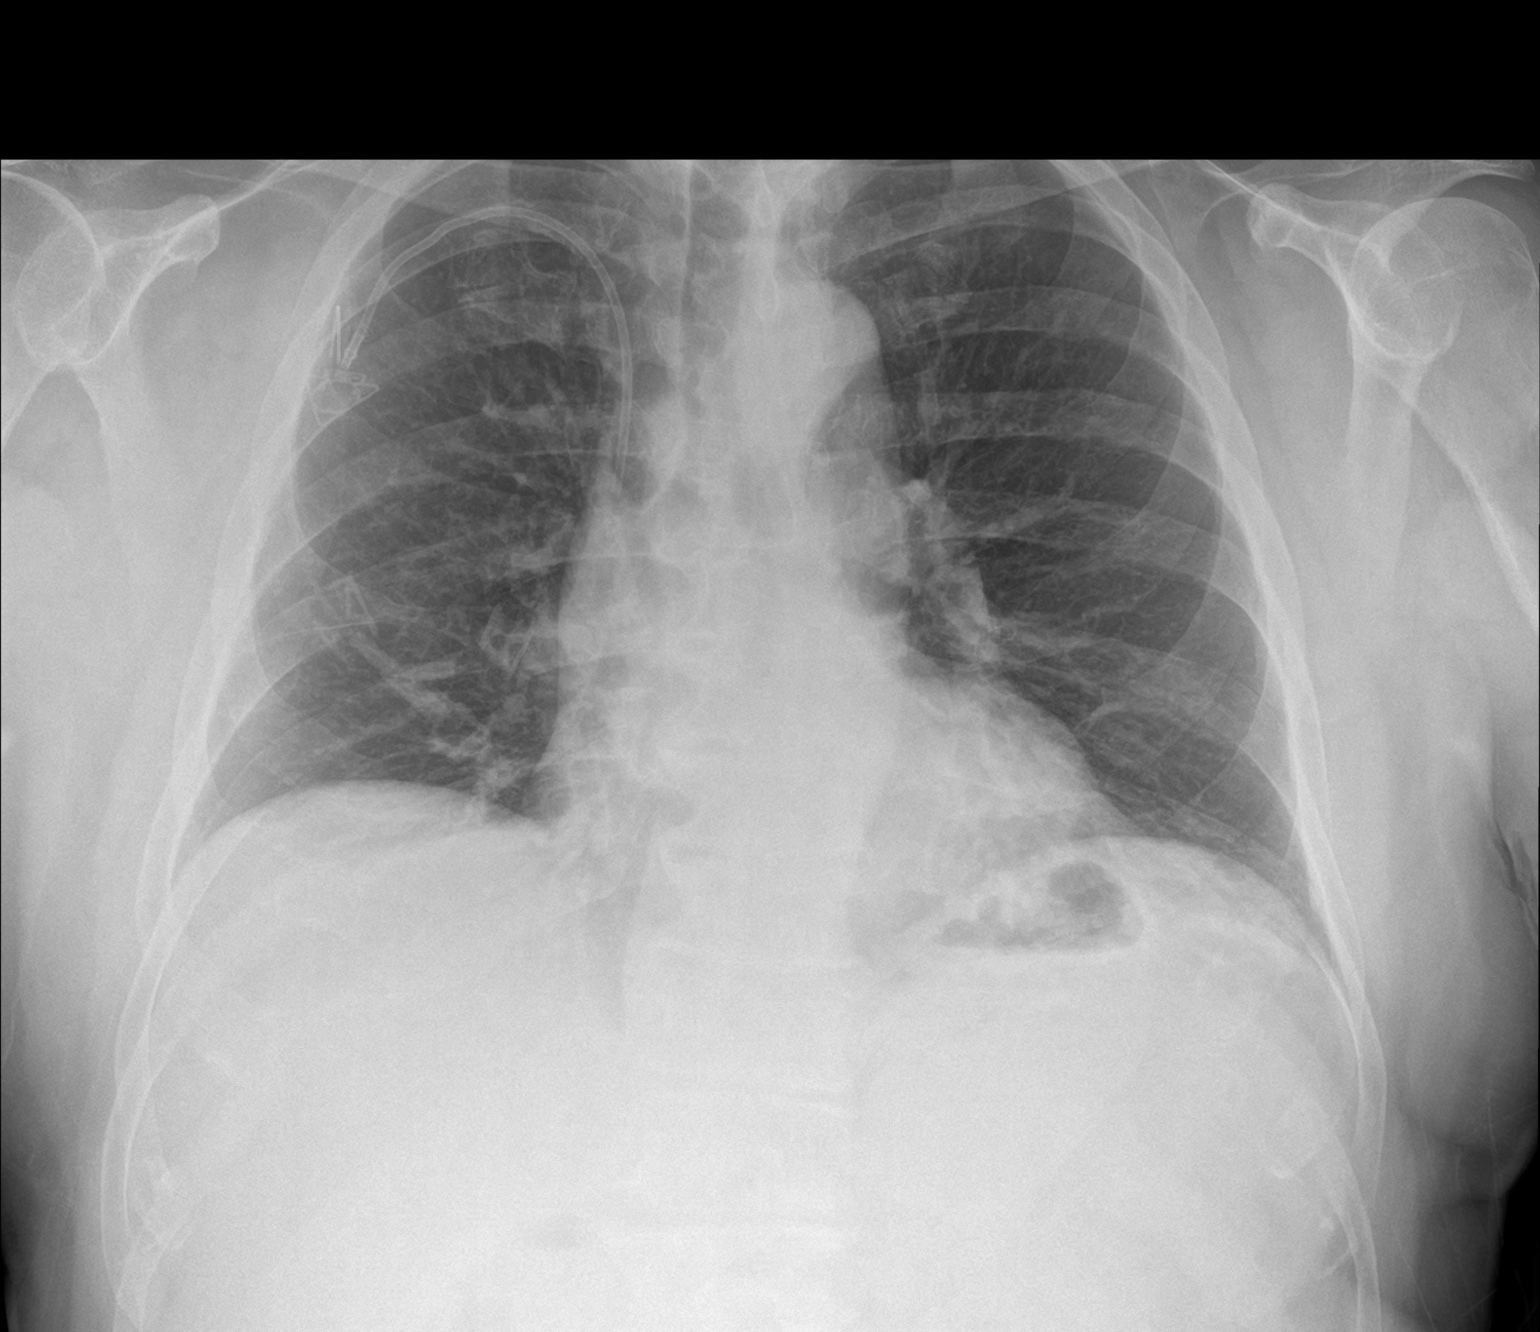

[chest lat]
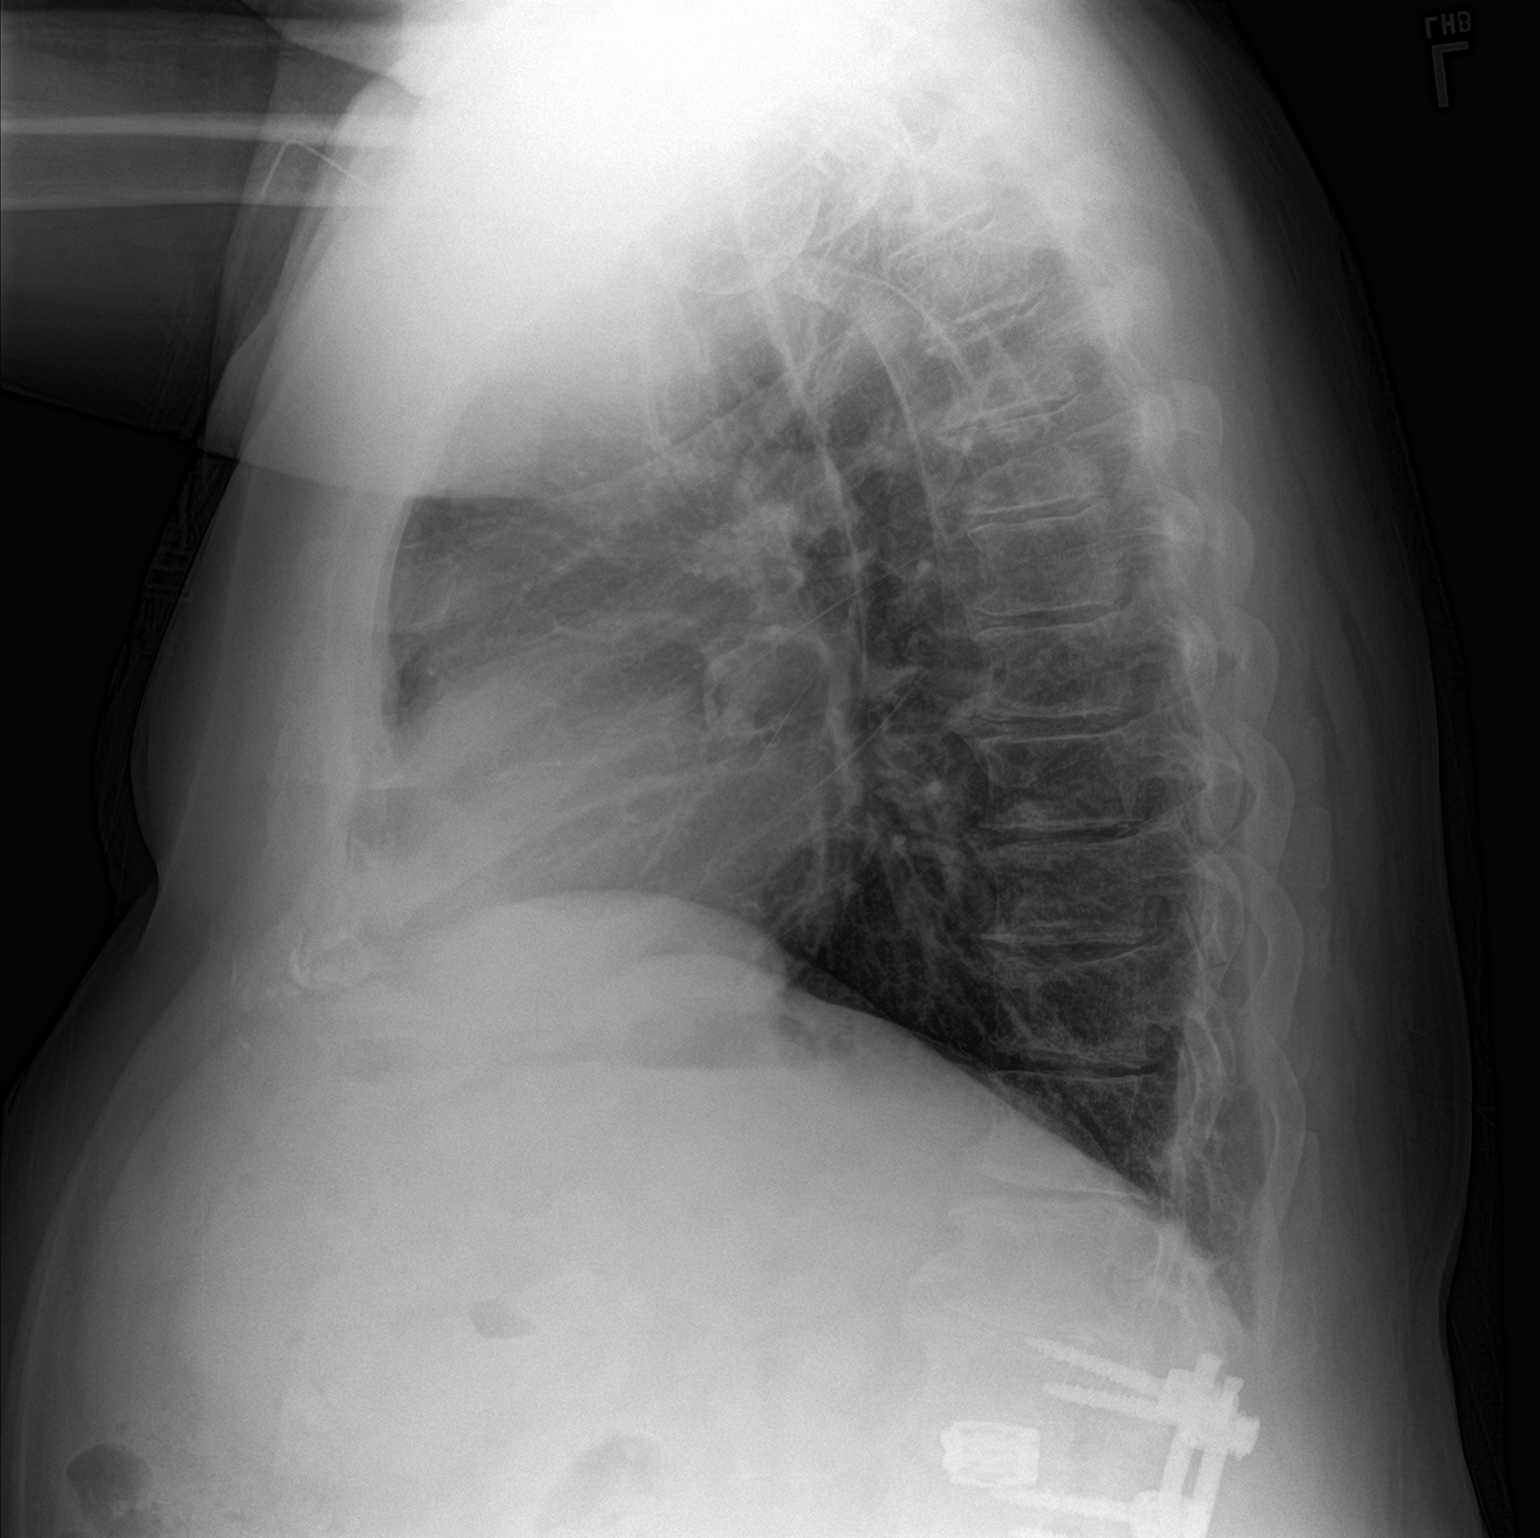

[2 of 2 positions shown; findings below may reference images not displayed]

FINDINGS: Cardiomediastinal silhouette unchanged in size and contour. No
pneumothorax or pleural effusion. No confluent airspace disease. Low
lung volumes. Right chest wall port catheter via subclavian
approach. No displaced fracture.
IMPRESSION: Chronic lung changes without evidence of acute cardiopulmonary
disease.

## 2021-03-05 NOTE — Progress Notes (Signed)
Richard Wallace, [place] [id number]   CLINIC:  Medical Oncology/Hematology  PCP:  [name], MD [address] 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[place])   08/22/2018 - 12/06/2018 Chemotherapy   The patient had palonosetron (ALOXI) injection 0.25 mg, 0.25 mg, Intravenous,  Once, 4 of 6 cycles Administration: 0.25 mg (08/22/2018), 0.25 mg (08/26/2018), 0.25 mg (08/28/2018), 0.25 mg (08/30/2018), 0.25 mg (10/01/2018), 0.25 mg (10/02/2018), 0.25 mg (11/04/2018), 0.25 mg (09/23/2018), 0.25 mg (09/25/2018), 0.25 mg (09/27/2018), 0.25 mg (10/28/2018), 0.25 mg (10/30/2018), 0.25 mg (11/01/2018), 0.25 mg (12/02/2018), 0.25 mg (12/04/2018), 0.25 mg (12/06/2018) azaCITIDine (VIDAZA) 100 mg in sodium chloride 0.9 % 50 mL chemo infusion, 110 mg (66.7 % of original dose 75 mg/m2), Intravenous, Once, 4 of 6 cycles Dose modification: 50 mg/m2 (original dose 75 mg/m2, Cycle 1, Reason: Provider Judgment), 50 mg/m2 (original dose 75 mg/m2, Cycle 2, Reason: Provider Judgment) Administration: 100 mg (08/22/2018), 100 mg (08/23/2018), 100 mg (08/26/2018), 100 mg (08/27/2018), 100 mg (08/28/2018), 100 mg (08/29/2018), 100 mg (08/30/2018), 100 mg (10/01/2018), 100 mg (10/02/2018), 100 mg (11/04/2018), 100 mg (11/05/2018), 100 mg (09/23/2018), 100 mg (09/24/2018), 100 mg (09/25/2018), 100 mg (09/26/2018), 100 mg (09/27/2018), 100 mg (10/28/2018), 100 mg (10/29/2018), 100 mg (10/30/2018), 100 mg (10/31/2018), 100 mg (11/01/2018), 100 mg (12/02/2018), 100 mg (12/03/2018), 100 mg (12/04/2018), 100 mg (12/05/2018), 100 mg (12/06/2018)   for chemotherapy treatment.     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  INTERVAL HISTORY:  Mr. Isiaha Greenup, a 75 y.o. male, returns for routine follow-up and consideration for next cycle of chemotherapy. Dayden was last seen on 01/24/2021.  Due for cycle #18 of Decitabine today.   Overall, he tells me he has been feeling pretty well. His appetite has decreased, and he reports chronic constipation for which he is drinking prune juice, eating bran cereal, and taking 2 stool softener every night; the constipation is not improved with Miralax. He has 1 BM every 3 days. He denies nausea.   Overall, he feels ready for next cycle of chemo today.   REVIEW OF SYSTEMS:  Review of Systems  Constitutional:  Positive for appetite change (75%). Negative for fatigue (60%).  Gastrointestinal:  Positive for constipation. Negative for nausea.  Neurological:  Positive for numbness.  All other systems reviewed and are negative.  PAST MEDICAL/SURGICAL HISTORY:  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   Cancer ([place]) 01-28-2014   skin cancer   CKD (chronic kidney disease), stage III (HCC)    GERD (gastroesophageal reflux disease)    Heart murmur    NOTED DURING PHYSICAL WHEN HE WAS ENLISTING IN [facility] ,  DIDNT KNOW UNTIL THAT TIME AND REPORTS , "THATS THE LAST I HEARD ABOUT IT "    History of hypertension    no longer issue   History of kidney stones    History of malignant melanoma of skin    excision top of scalp 2015-- no recurrence   History of urinary retention    post op lumbar fusion surgery 04/ 2016    Hypertension    Kidney dysfunction    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, MONITORED BY [facility] DR Annie Main DAHLSTEDT    Left ureteral calculus    Seasonal allergies    Wears glasses    Wears glasses    Wears partial dentures    upper and lower   Past Surgical History:  Procedure Laterality Date   ANKLE FUSION Right 2007   CARPAL TUNNEL RELEASE Left 12/28/2009   w/ pulley release left long finger   CARPAL TUNNEL RELEASE Right 07/22/2013   Procedure: RIGHT CARPAL TUNNEL RELEASE;  Surgeon: Cammie Sickle., MD;  Location: [facility];  Service: Orthopedics;  Laterality: Right;   COLONOSCOPY     CYSTO/  LEFT RETROGRADE PYELOGRAM  11/21/2010   CYSTOSCOPY WITH STENT PLACEMENT Left 03/09/2016   Procedure: CYSTOSCOPY WITH STENT PLACEMENT;  Surgeon: Franchot Gallo, MD;  Location: [facility];  Service: Urology;  Laterality: Left;   CYSTOSCOPY/RETROGRADE/URETEROSCOPY/STONE EXTRACTION WITH BASKET Left 03/09/2016   Procedure: CYSTOSCOPY/RETROGRADE/URETEROSCOPY/STONE EXTRACTION WITH BASKET;  Surgeon: Franchot Gallo, MD;  Location[facility];  Service: Urology;  Laterality: Left;   LEFT URETEROSCOPIC LASER LITHOTRIPSY STONE EXTRACTION/ STENT PLACEMENT  05/23/2010   MOHS SURGERY     TOP OF THE HEAD    ORIF ANKLE FRACTURE Right 1978   PORT-A-CATH REMOVAL Right 02/14/2019   Procedure: MINOR REMOVAL PORT-A-CATH;  Surgeon: Aviva Signs, MD;  Location: [facility];  Service: General;  Laterality: Right;   PORTACATH PLACEMENT Right 08/19/2018   Procedure: INSERTION PORT-A-CATH (attached catheter in right subclavian);  Surgeon: Aviva Signs, MD;  Location: [facility];  Service: General;  Laterality: Right;   PORTACATH PLACEMENT Left 05/16/2019   Procedure: INSERTION PORT-A-CATH (attached catheter in left subclavian);  Surgeon: Aviva Signs, MD;  Location: [facility];  Service: General;  Laterality: Left;   POSTERIOR LUMBAR FUSION  08/21/2014   laminectomy and  decompression L2 -- L5   RIGHT LOWER LEG SURGERY  X3  1975 to 1976   including ORIF   TONSILLECTOMY AND ADENOIDECTOMY  7510   UMBILICAL HERNIA REPAIR  2009 approx    SOCIAL HISTORY:  Social History   Socioeconomic History   Marital status: Married    Spouse name: Not on file   Number of children: Not on file   Years of education: Not on file   Highest education level: Not on file  Occupational History   Not on file  Tobacco Use   Smoking status: Former    Years: 20.00    Types: Cigarettes    Quit date: 07/17/1986    Years since quitting: 34.6   Smokeless tobacco: Never  Vaping Use   Vaping Use: Never used  Substance and Sexual Activity   Alcohol use: Yes    Alcohol/week: 7.0 - 14.0 standard drinks    Types: 7 - 14 Cans of beer per week    Comment: 1 -2 beer daily   Drug use: No   Sexual activity: Not Currently  Other Topics Concern   Not on file  Social History Narrative  Not on file   Social Determinants of Health   Financial Resource Strain: Low Risk    Difficulty of Paying Living Expenses: Not hard at all  Food Insecurity: No Food Insecurity   Worried About Charity fundraiser in the Last Year: Never true   Post in the Last Year: Never true  Transportation Needs: No Transportation Needs   Lack of Transportation (Medical): No   Lack of Transportation (Non-Medical): No  Physical Activity: Not on file  Stress: No Stress Concern Present   Feeling of Stress : Not at all  Social Connections: Moderately Isolated   Frequency of Communication with Friends and Family: Twice a week   Frequency of Social Gatherings with Friends and Family: Never   Attends Religious Services: Never   Marine scientist or Organizations: Yes   Attends Music therapist: More than 4 times per year   Marital Status: Married  Human resources officer Violence: Not on file    FAMILY HISTORY:  Family History  Problem Relation Age of Onset   Stroke Mother     Prostate cancer Father    Bone cancer Father    Diverticulitis Father    Rheum arthritis Sister    Urinary tract infection Sister    Colon cancer Neg Hx     CURRENT MEDICATIONS:  Current Outpatient Medications  Medication Sig Dispense Refill   allopurinol (ZYLOPRIM) 300 MG tablet Take 1 tablet (300 mg total) by mouth at bedtime. 90 tablet 3   amLODipine (NORVASC) 2.5 MG tablet Take 1 tablet (2.5 mg total) by mouth daily. 90 tablet 2   cetirizine (ZYRTEC) 10 MG tablet Take 10 mg by mouth daily. IN THE MORNING     docusate sodium (COLACE) 100 MG capsule Take 100 mg by mouth at bedtime.      donepezil (ARICEPT) 10 MG tablet Take 1 tablet (10 mg total) by mouth at bedtime. 30 tablet 3   ipratropium (ATROVENT) 0.03 % nasal spray Place 2 sprays into both nostrils 3 (three) times daily.     lansoprazole (PREVACID) 15 MG capsule Take 15 mg by mouth daily.      lidocaine (LIDODERM) 5 % Place 1 patch onto the skin daily. Remove & Discard patch within 12 hours or as directed by MD 30 patch 0   magnesium oxide (MAG-OX) 400 (240 Mg) MG tablet TAKE 1 TABLET BY MOUTH THREE TIMES DAILY 90 tablet 6   magnesium oxide (MAG-OX) 400 (241.3 Mg) MG tablet Take 1 tablet (400 mg total) by mouth 3 (three) times daily. (Patient taking differently: Take 1 tablet by mouth 4 (four) times daily.) 90 tablet 3   SF 5000 PLUS 1.1 % CREA dental cream SMARTSIG:Sparingly By Mouth Every Night     venetoclax (VENCLEXTA) 100 MG tablet TAKE 2 TABLETS (200 MG) BY MOUTH DAILY 60 tablet 2   ALPRAZolam (XANAX) 0.25 MG tablet Take 1 tablet by mouth twice daily as needed for anxiety (Patient not taking: Reported on 03/07/2021) 60 tablet 0   diclofenac Sodium (VOLTAREN) 1 % GEL Apply three times daily to knees as needed for pain (Patient not taking: Reported on 03/07/2021) 50 g 3   HYDROcodone-acetaminophen (NORCO) 10-325 MG tablet Take 1 tablet by mouth every 6 (six) hours as needed. (Patient not taking: Reported on 03/07/2021) 112  tablet 0   lidocaine-prilocaine (EMLA) cream Apply 1 application topically See admin instructions. ONE HOUR PRIOR TO CHEMOTHERAPY APPOINTMENT 30 g 6   tamsulosin (FLOMAX) 0.4  MG CAPS capsule Take 1 capsule (0.4 mg total) by mouth every evening. 30 capsule 6   No current facility-administered medications for this visit.   Facility-Administered Medications Ordered in Other Visits  Medication Dose Route Frequency Provider Last Rate Last Admin   sodium chloride flush (NS) 0.9 % injection 10 mL  10 mL Intracatheter PRN [name], MD   10 mL at 11/21/19 1030   sodium chloride flush (NS) 0.9 % injection 20 mL  20 mL Intravenous PRN [name], MD   20 mL at 07/21/19 1053    ALLERGIES:  No Known Allergies  PHYSICAL EXAM:  Performance status (ECOG): 1 - Symptomatic but completely ambulatory  Vitals:   03/07/21 1051  BP: 120/79  Pulse: 70  Resp: 18  Temp: 97.9 F (36.6 C)  SpO2: 93%   Wt Readings from Last 3 Encounters:  03/07/21 217 lb 1.6 oz (98.5 kg)  01/24/21 215 lb 3.2 oz (97.6 kg)  12/13/20 217 lb 11.2 oz (98.7 kg)   Physical Exam Vitals reviewed.  Constitutional:      Appearance: Normal appearance. He is obese.     Comments: In wheelchair  Cardiovascular:     Rate and Rhythm: Normal rate and regular rhythm.     Pulses: Normal pulses.     Heart sounds: Normal heart sounds.  Pulmonary:     Effort: Pulmonary effort is normal.     Breath sounds: Normal breath sounds.  Neurological:     General: No focal deficit present.     Mental Status: He is alert and oriented to person, place, and time.  Psychiatric:        Mood and Affect: Mood normal.        Behavior: Behavior normal.    LABORATORY DATA:  I have reviewed the labs as listed.  CBC Latest Ref Rng & Units 03/07/2021 01/24/2021 12/13/2020  WBC 4.0 - 10.5 K/uL 4.7 4.2 3.6(L)  Hemoglobin 13.0 - 17.0 g/dL 15.3 14.8 14.1  Hematocrit 39.0 - 52.0 % 46.9 45.3 43.8  Platelets 150 - 400 K/uL 136(L) 130(L)  142(L)   CMP Latest Ref Rng & Units 03/07/2021 01/24/2021 12/13/2020  Glucose 70 - 99 mg/dL 93 111(H) 112(H)  BUN 8 - 23 mg/dL 21 20 19   Creatinine 0.61 - 1.24 mg/dL 1.03 1.07 0.93  Sodium 135 - 145 mmol/L 140 138 140  Potassium 3.5 - 5.1 mmol/L 4.5 4.8 4.5  Chloride 98 - 111 mmol/L 105 105 109  CO2 22 - 32 mmol/L 28 28 28   Calcium 8.9 - 10.3 mg/dL 9.3 8.9 9.1  Total Protein 6.5 - 8.1 g/dL 6.5 6.2(L) 5.9(L)  Total Bilirubin 0.3 - 1.2 mg/dL 0.6 0.6 0.5  Alkaline Phos 38 - 126 U/L 73 74 70  AST 15 - 41 U/L 26 22 23   ALT 0 - 44 U/L 28 26 25     DIAGNOSTIC IMAGING:  I have independently reviewed the scans and discussed with the patient. No results found.   ASSESSMENT:  1.  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  -BMBX on 02/25/2019 after cycle 1 did not show any evidence of leukemia. -CT CAP on 02/12/2019 shows numerous bilateral irregular/spiculated pulmonary nodules measuring up to 14 mm, nonspecific.  Hepatic steatosis.  Spleen is normal. -I have talked to Dr.Ravandi at [facility] per patient request.  There are clinical trials available upon progression of his AML.  There are also  some clinical trials available now based on MRD positivity.  Patient not interested in moving to [place] at this time.   PLAN:  1.  AML: - He does not report any infections or hospitalizations in the last 6 weeks. - No GI side effects from venetoclax. - He has constipation which is new.  He is taking stool softener 2/day.  He is also using prune juice and MiraLAX. - We will decrease Zofran to 4 mg daily in the premeds. - Reviewed labs today which showed normal LFTs.  CBC shows mild thrombocytopenia with platelet count 136. - Start venetoclax 200 mg daily for 2 weeks.  Proceed with next cycle of decitabine today. - RTC 6 to 7 weeks for follow-up.   2.  Arthralgias: -Continue hydrocodone 10/325 1 tablet every 6  hours as needed.   3.  Hypomagnesemia: - Continue magnesium 3 times daily.  Magnesium today is 2.0.   4.  Hypertension: - Continue Norvasc 2.5 mg daily.  Systolic blood pressure today is 120/77.   5.  Dementia: - Continue Aricept 10 mg daily.  6.  Peripheral neuropathy: - He has chronic numbness in the feet which is stable.  He also has intermittent numbness in the fingertips which is stable.   Orders placed this encounter:  No orders of the defined types were placed in this encounter.    [name], MD [place] [phone]   I, Thana Ates, am acting as a scribe for Dr. [name].  I, [name] MD, have reviewed the above documentation for accuracy and completeness, and I agree with the above.

## 2021-03-07 ENCOUNTER — Inpatient Hospital Stay (HOSPITAL_COMMUNITY): Payer: Medicare Other | Attending: Hematology

## 2021-03-07 ENCOUNTER — Other Ambulatory Visit: Payer: Self-pay

## 2021-03-07 ENCOUNTER — Inpatient Hospital Stay (HOSPITAL_BASED_OUTPATIENT_CLINIC_OR_DEPARTMENT_OTHER): Payer: Medicare Other | Admitting: Hematology

## 2021-03-07 ENCOUNTER — Inpatient Hospital Stay (HOSPITAL_COMMUNITY): Payer: Medicare Other

## 2021-03-07 VITALS — BP 128/86 | HR 73 | Temp 97.6°F | Resp 18

## 2021-03-07 VITALS — BP 120/79 | HR 70 | Temp 97.9°F | Resp 18 | Wt 217.1 lb

## 2021-03-07 DIAGNOSIS — N183 Chronic kidney disease, stage 3 unspecified: Secondary | ICD-10-CM | POA: Diagnosis not present

## 2021-03-07 DIAGNOSIS — I129 Hypertensive chronic kidney disease with stage 1 through stage 4 chronic kidney disease, or unspecified chronic kidney disease: Secondary | ICD-10-CM | POA: Diagnosis not present

## 2021-03-07 DIAGNOSIS — R918 Other nonspecific abnormal finding of lung field: Secondary | ICD-10-CM | POA: Diagnosis not present

## 2021-03-07 DIAGNOSIS — C9200 Acute myeloblastic leukemia, not having achieved remission: Secondary | ICD-10-CM | POA: Diagnosis present

## 2021-03-07 DIAGNOSIS — Z79899 Other long term (current) drug therapy: Secondary | ICD-10-CM | POA: Diagnosis not present

## 2021-03-07 DIAGNOSIS — Z5111 Encounter for antineoplastic chemotherapy: Secondary | ICD-10-CM | POA: Diagnosis not present

## 2021-03-07 DIAGNOSIS — D46Z Other myelodysplastic syndromes: Secondary | ICD-10-CM

## 2021-03-07 DIAGNOSIS — F0390 Unspecified dementia without behavioral disturbance: Secondary | ICD-10-CM | POA: Diagnosis not present

## 2021-03-07 DIAGNOSIS — G629 Polyneuropathy, unspecified: Secondary | ICD-10-CM | POA: Diagnosis not present

## 2021-03-07 DIAGNOSIS — Z95828 Presence of other vascular implants and grafts: Secondary | ICD-10-CM

## 2021-03-07 DIAGNOSIS — D696 Thrombocytopenia, unspecified: Secondary | ICD-10-CM | POA: Insufficient documentation

## 2021-03-07 LAB — COMPREHENSIVE METABOLIC PANEL
ALT: 28 U/L (ref 0–44)
AST: 26 U/L (ref 15–41)
Albumin: 4 g/dL (ref 3.5–5.0)
Alkaline Phosphatase: 73 U/L (ref 38–126)
Anion gap: 7 (ref 5–15)
BUN: 21 mg/dL (ref 8–23)
CO2: 28 mmol/L (ref 22–32)
Calcium: 9.3 mg/dL (ref 8.9–10.3)
Chloride: 105 mmol/L (ref 98–111)
Creatinine, Ser: 1.03 mg/dL (ref 0.61–1.24)
GFR, Estimated: >60 mL/min (ref >60)
Glucose, Bld: 93 mg/dL (ref 70–99)
Potassium: 4.5 mmol/L (ref 3.5–5.1)
Sodium: 140 mmol/L (ref 135–145)
Total Bilirubin: 0.6 mg/dL (ref 0.3–1.2)
Total Protein: 6.5 g/dL (ref 6.5–8.1)

## 2021-03-07 LAB — CBC WITH DIFFERENTIAL/PLATELET
Abs Immature Granulocytes: 0.04 10*3/uL (ref 0.00–0.07)
Basophils Absolute: 0.1 10*3/uL (ref 0.0–0.1)
Basophils Relative: 1 %
Eosinophils Absolute: 0.1 10*3/uL (ref 0.0–0.5)
Eosinophils Relative: 3 %
HCT: 46.9 % (ref 39.0–52.0)
Hemoglobin: 15.3 g/dL (ref 13.0–17.0)
Immature Granulocytes: 1 %
Lymphocytes Relative: 25 %
Lymphs Abs: 1.2 10*3/uL (ref 0.7–4.0)
MCH: 31.5 pg (ref 26.0–34.0)
MCHC: 32.6 g/dL (ref 30.0–36.0)
MCV: 96.7 fL (ref 80.0–100.0)
Monocytes Absolute: 0.4 10*3/uL (ref 0.1–1.0)
Monocytes Relative: 8 %
Neutro Abs: 2.9 10*3/uL (ref 1.7–7.7)
Neutrophils Relative %: 62 %
Platelets: 136 10*3/uL — ABNORMAL LOW (ref 150–400)
RBC: 4.85 MIL/uL (ref 4.22–5.81)
RDW: 15.3 % (ref 11.5–15.5)
WBC: 4.7 10*3/uL (ref 4.0–10.5)
nRBC: 0 % (ref 0.0–0.2)

## 2021-03-07 LAB — LACTATE DEHYDROGENASE: LDH: 156 U/L (ref 98–192)

## 2021-03-07 LAB — MAGNESIUM: Magnesium: 2 mg/dL (ref 1.7–2.4)

## 2021-03-07 MED ORDER — SODIUM CHLORIDE 0.9 % IV SOLN
15.0000 mg/m2 | Freq: Once | INTRAVENOUS | Status: AC
  Administered 2021-03-07: 35 mg via INTRAVENOUS
  Filled 2021-03-07: qty 7

## 2021-03-07 MED ORDER — TAMSULOSIN HCL 0.4 MG PO CAPS: 0.4000 mg | ORAL_CAPSULE | Freq: Every evening | ORAL | 6 refills | Status: DC

## 2021-03-07 MED ORDER — SODIUM CHLORIDE 0.9 % IV SOLN: Freq: Once | INTRAVENOUS | Status: AC

## 2021-03-07 MED ORDER — LIDOCAINE-PRILOCAINE 2.5-2.5 % EX CREA: 1.0000 "application " | TOPICAL_CREAM | CUTANEOUS | 6 refills | Status: DC

## 2021-03-07 MED ORDER — ONDANSETRON HCL 4 MG/2ML IJ SOLN
4.0000 mg | Freq: Once | INTRAMUSCULAR | Status: AC
  Administered 2021-03-07: 4 mg via INTRAVENOUS
  Filled 2021-03-07: qty 2

## 2021-03-07 MED ORDER — SODIUM CHLORIDE 0.9% FLUSH
10.0000 mL | INTRAVENOUS | Status: DC | PRN
  Administered 2021-03-07: 10 mL

## 2021-03-07 MED ORDER — HEPARIN SOD (PORK) LOCK FLUSH 100 UNIT/ML IV SOLN
500.0000 [IU] | Freq: Once | INTRAVENOUS | Status: AC | PRN
  Administered 2021-03-07: 500 [IU]

## 2021-03-07 NOTE — Patient Instructions (Signed)
[place] at [facility] Discharge Instructions  You were seen and examined today by Dr. Delton Coombes.  You will receive your treatment today and the rest of this week.  Start taking Miralax full dose daily - call the clinic if this isn't helping with constipation.   Continue Venetoclax as prescribed.  Return as scheduled in 6 weeks for lab work, office visit, and treatment.    Thank you for choosing [place] at [facility] to provide your oncology and hematology care.  To afford each patient quality time with our provider, please arrive at least 15 minutes before your scheduled appointment time.   If you have a lab appointment with the [facility] please come in thru the Main Entrance and check in at the main information desk.  You need to re-schedule your appointment should you arrive 10 or more minutes late.  We strive to give you quality time with our providers, and arriving late affects you and other patients whose appointments are after yours.  Also, if you no show three or more times for appointments you may be dismissed from the clinic at the providers discretion.     Again, thank you for choosing [facility].  Our hope is that these requests will decrease the amount of time that you wait before being seen by our physicians.       _____________________________________________________________  Should you have questions after your visit to [facility], please contact our office at [phone] and follow the prompts.  Our office hours are 8:00 a.m. and 4:30 p.m. Monday - Friday.  Please note that voicemails left after 4:00 p.m. may not be returned until the following business day.  We are closed weekends and major holidays.  You do have access to a nurse 24-7, just call the main number to the clinic [phone] and do not press any options, hold on the line and a nurse will answer the phone.    For  prescription refill requests, have your pharmacy contact our office and allow 72 hours.    Due to Covid, you will need to wear a mask upon entering the hospital. If you do not have a mask, a mask will be given to you at the Main Entrance upon arrival. For doctor visits, patients may have 1 support person age 51 or older with them. For treatment visits, patients can not have anyone with them due to social distancing guidelines and our immunocompromised population.

## 2021-03-07 NOTE — Progress Notes (Signed)
Pt here for D1 of decitabine.  Labs and vital signs WNL for treatment.  Zofran in treatment plan decreased to 4mg  due to constipation.   Tolerated treatment well today without incidence.  Stable during and after treatment.  AVS reviewed.  Vital signs stable prior to discharge.  Discharged in stable condition via motorized wheelchair.

## 2021-03-07 NOTE — Progress Notes (Signed)
Patient is taking Venetoclax as prescribed.  He has not missed any doses and reports no side effects at this time.    Patient has been examined, vital signs and labs have been reviewed by Dr. Delton Coombes. ANC, Creatinine, LFTs, hemoglobin, and platelets are within treatment parameters per Dr. Delton Coombes. Patient may proceed with treatment per M.D.

## 2021-03-07 NOTE — Patient Instructions (Signed)
Richard Wallace  Discharge Instructions: Thank you for choosing [facility] to provide your oncology and hematology care.  If you have a lab appointment with the [facility], please come in thru the Main Entrance and check in at the main information desk.  Wear comfortable clothing and clothing appropriate for easy access to any Portacath or PICC line.   We strive to give you quality time with your provider. You may need to reschedule your appointment if you arrive late (15 or more minutes).  Arriving late affects you and other patients whose appointments are after yours.  Also, if you miss three or more appointments without notifying the office, you may be dismissed from the clinic at the provider's discretion.      For prescription refill requests, have your pharmacy contact our office and allow 72 hours for refills to be completed.    Today you received the following chemotherapy and/or immunotherapy agents decitabine      To help prevent nausea and vomiting after your treatment, we encourage you to take your nausea medication as directed.  BELOW ARE SYMPTOMS THAT SHOULD BE REPORTED IMMEDIATELY: *FEVER GREATER THAN 100.4 F (38 C) OR HIGHER *CHILLS OR SWEATING *NAUSEA AND VOMITING THAT IS NOT CONTROLLED WITH YOUR NAUSEA MEDICATION *UNUSUAL SHORTNESS OF BREATH *UNUSUAL BRUISING OR BLEEDING *URINARY PROBLEMS (pain or burning when urinating, or frequent urination) *BOWEL PROBLEMS (unusual diarrhea, constipation, pain near the anus) TENDERNESS IN MOUTH AND THROAT WITH OR WITHOUT PRESENCE OF ULCERS (sore throat, sores in mouth, or a toothache) UNUSUAL RASH, SWELLING OR PAIN  UNUSUAL VAGINAL DISCHARGE OR ITCHING   Items with * indicate a potential emergency and should be followed up as soon as possible or go to the Emergency Department if any problems should occur.  Please show the CHEMOTHERAPY ALERT CARD or IMMUNOTHERAPY ALERT CARD at check-in to the Emergency  Department and triage nurse.  Should you have questions after your visit or need to cancel or reschedule your appointment, please contact [facility] [phone]  and follow the prompts.  Office hours are 8:00 a.m. to 4:30 p.m. Monday - Friday. Please note that voicemails left after 4:00 p.m. may not be returned until the following business day.  We are closed weekends and major holidays. You have access to a nurse at all times for urgent questions. Please call the main number to the clinic [phone] and follow the prompts.  For any non-urgent questions, you may also contact your provider using MyChart. We now offer e-Visits for anyone 43 and older to request care online for non-urgent symptoms. For details visit [email or web address].   Also download the MyChart app! Go to the app store, search "MyChart", open the app, select [place], and log in with your MyChart username and password.  Due to Covid, a mask is required upon entering the hospital/clinic. If you do not have a mask, one will be given to you upon arrival. For doctor visits, patients may have 1 support person aged 29 or older with them. For treatment visits, patients cannot have anyone with them due to current Covid guidelines and our immunocompromised population.   Decitabine injection for infusion What is this medication? DECITABINE (dee SYE ta been) is a chemotherapy drug. This medicine reduces the growth of cancer cells. It is used to treat adults with myelodysplastic syndromes. This medicine may be used for other purposes; ask your health care provider or pharmacist if you have questions. COMMON BRAND NAME(S): Dacogen What  should I tell my care team before I take this medication? They need to know if you have any of these conditions: infection (especially a virus infection such as chickenpox, cold sores, or herpes) kidney disease liver disease an unusual or allergic reaction to decitabine, other medicines,  foods, dyes, or preservatives pregnant or trying to get pregnant breast-feeding How should I use this medication? This medicine is for infusion into a vein. It is administered in a hospital or clinic by a doctor or health care professional. Talk to your pediatrician regarding the use of this medicine in children. Special care may be needed. Overdosage: If you think you have taken too much of this medicine contact a poison control center or emergency room at once. NOTE: This medicine is only for you. Do not share this medicine with others. What if I miss a dose? It is important not to miss your dose. Call your doctor or health care professional if you are unable to keep an appointment. What may interact with this medication? vaccines Talk to your doctor or health care professional before taking any of these medicines: aspirin acetaminophen ibuprofen ketoprofen naproxen This list may not describe all possible interactions. Give your health care provider a list of all the medicines, herbs, non-prescription drugs, or dietary supplements you use. Also tell them if you smoke, drink alcohol, or use illegal drugs. Some items may interact with your medicine. What should I watch for while using this medication? Visit your doctor for checks on your progress. This drug may make you feel generally unwell. This is not uncommon, as chemotherapy can affect healthy cells as well as cancer cells. Report any side effects. Continue your course of treatment even though you feel ill unless your doctor tells you to stop. You may need blood work done while you are taking this medicine. In some cases, you may be given additional medicines to help with side effects. Follow all directions for their use. Call your doctor or health care professional for advice if you get a fever, chills or sore throat, or other symptoms of a cold or flu. Do not treat yourself. This drug decreases your body's ability to fight infections.  Try to avoid being around people who are sick. This medicine may increase your risk to bruise or bleed. Call your doctor or health care professional if you notice any unusual bleeding. Do not become pregnant while taking this medicine or for 6 months after stopping it. Women should inform their doctor if they wish to become pregnant or think they might be pregnant. Men should not father a child while taking this medicine and for 3 months after stopping it. There is a potential for serious side effects to an unborn child. Talk to your health care professional or pharmacist for more information. Do not breast-feed an infant while taking this medicine or for at least 2 weeks after stopping it. In males, this medicine may interfere with the ability to father a child. Talk with your doctor or health care professional if you are concerned about your fertility. What side effects may I notice from receiving this medication? Side effects that you should report to your doctor or health care professional as soon as possible: low blood counts - this medicine may decrease the number of white blood cells, red blood cells and platelets. You may be at increased risk for infections and bleeding. signs of infection - fever or chills, cough, sore throat, pain or difficulty passing urine signs of decreased platelets  or bleeding - bruising, pinpoint red spots on the skin, black, tarry stools, blood in the urine signs of decreased red blood cells - unusual weakness or tiredness, fainting spells, lightheadedness increased blood sugar Side effects that usually do not require medical attention (report to your doctor or health care professional if they continue or are bothersome): constipation diarrhea headache loss of appetite nausea, vomiting skin rash, itching stomach pain water retention weak or tired This list may not describe all possible side effects. Call your doctor for medical advice about side effects. You may  report side effects to FDA at 1-800-FDA-1088. Where should I keep my medication? This drug is given in a hospital or clinic and will not be stored at home. NOTE: This sheet is a summary. It may not cover all possible information. If you have questions about this medicine, talk to your doctor, pharmacist, or health care provider.  2022 Elsevier/Gold Standard (2018-07-05 13:32:17)

## 2021-03-08 ENCOUNTER — Inpatient Hospital Stay (HOSPITAL_COMMUNITY): Payer: Medicare Other | Attending: Hematology

## 2021-03-08 ENCOUNTER — Encounter (HOSPITAL_COMMUNITY): Payer: Self-pay

## 2021-03-08 VITALS — BP 140/85 | HR 61 | Temp 97.8°F | Resp 118

## 2021-03-08 DIAGNOSIS — Z79899 Other long term (current) drug therapy: Secondary | ICD-10-CM | POA: Diagnosis not present

## 2021-03-08 DIAGNOSIS — D46Z Other myelodysplastic syndromes: Secondary | ICD-10-CM

## 2021-03-08 DIAGNOSIS — C9200 Acute myeloblastic leukemia, not having achieved remission: Secondary | ICD-10-CM | POA: Insufficient documentation

## 2021-03-08 DIAGNOSIS — Z5111 Encounter for antineoplastic chemotherapy: Secondary | ICD-10-CM | POA: Diagnosis present

## 2021-03-08 DIAGNOSIS — Z95828 Presence of other vascular implants and grafts: Secondary | ICD-10-CM

## 2021-03-08 MED ORDER — SODIUM CHLORIDE 0.9% FLUSH
10.0000 mL | INTRAVENOUS | Status: DC | PRN
  Administered 2021-03-08: 10 mL

## 2021-03-08 MED ORDER — ONDANSETRON HCL 4 MG/2ML IJ SOLN
4.0000 mg | Freq: Once | INTRAMUSCULAR | Status: AC
  Administered 2021-03-08: 4 mg via INTRAVENOUS
  Filled 2021-03-08: qty 2

## 2021-03-08 MED ORDER — HEPARIN SOD (PORK) LOCK FLUSH 100 UNIT/ML IV SOLN
500.0000 [IU] | Freq: Once | INTRAVENOUS | Status: AC | PRN
  Administered 2021-03-08: 500 [IU]

## 2021-03-08 MED ORDER — SODIUM CHLORIDE 0.9 % IV SOLN: Freq: Once | INTRAVENOUS | Status: AC

## 2021-03-08 MED ORDER — SODIUM CHLORIDE 0.9 % IV SOLN
15.0000 mg/m2 | Freq: Once | INTRAVENOUS | Status: AC
  Administered 2021-03-08: 35 mg via INTRAVENOUS
  Filled 2021-03-08: qty 7

## 2021-03-08 NOTE — Progress Notes (Signed)
Pt here for D2 of decitibine.  No complaints to note.  Vital signs WNL for treatment.  Tolerated treatment well today without incidence.  AVS reviewed.  Stable during and after treatment.  Vital sign stable prior to discharge.  Discharged in stable condition via motorized wheelchair.

## 2021-03-08 NOTE — Patient Instructions (Signed)
Richard Wallace  Discharge Instructions: Thank you for choosing [facility] to provide your oncology and hematology care.  If you have a lab appointment with the [facility], please come in thru the Main Entrance and check in at the main information desk.  Wear comfortable clothing and clothing appropriate for easy access to any Portacath or PICC line.   We strive to give you quality time with your provider. You may need to reschedule your appointment if you arrive late (15 or more minutes).  Arriving late affects you and other patients whose appointments are after yours.  Also, if you miss three or more appointments without notifying the office, you may be dismissed from the clinic at the provider's discretion.      For prescription refill requests, have your pharmacy contact our office and allow 72 hours for refills to be completed.    Today you received the following chemotherapy and/or immunotherapy agents decitabine      To help prevent nausea and vomiting after your treatment, we encourage you to take your nausea medication as directed.  BELOW ARE SYMPTOMS THAT SHOULD BE REPORTED IMMEDIATELY: *FEVER GREATER THAN 100.4 F (38 C) OR HIGHER *CHILLS OR SWEATING *NAUSEA AND VOMITING THAT IS NOT CONTROLLED WITH YOUR NAUSEA MEDICATION *UNUSUAL SHORTNESS OF BREATH *UNUSUAL BRUISING OR BLEEDING *URINARY PROBLEMS (pain or burning when urinating, or frequent urination) *BOWEL PROBLEMS (unusual diarrhea, constipation, pain near the anus) TENDERNESS IN MOUTH AND THROAT WITH OR WITHOUT PRESENCE OF ULCERS (sore throat, sores in mouth, or a toothache) UNUSUAL RASH, SWELLING OR PAIN  UNUSUAL VAGINAL DISCHARGE OR ITCHING   Items with * indicate a potential emergency and should be followed up as soon as possible or go to the Emergency Department if any problems should occur.  Please show the CHEMOTHERAPY ALERT CARD or IMMUNOTHERAPY ALERT CARD at check-in to the Emergency  Department and triage nurse.  Should you have questions after your visit or need to cancel or reschedule your appointment, please contact [facility] [phone]  and follow the prompts.  Office hours are 8:00 a.m. to 4:30 p.m. Monday - Friday. Please note that voicemails left after 4:00 p.m. may not be returned until the following business day.  We are closed weekends and major holidays. You have access to a nurse at all times for urgent questions. Please call the main number to the clinic [phone] and follow the prompts.  For any non-urgent questions, you may also contact your provider using MyChart. We now offer e-Visits for anyone 38 and older to request care online for non-urgent symptoms. For details visit [email or web address].   Also download the MyChart app! Go to the app store, search "MyChart", open the app, select [place], and log in with your MyChart username and password.  Due to Covid, a mask is required upon entering the hospital/clinic. If you do not have a mask, one will be given to you upon arrival. For doctor visits, patients may have 1 support person aged 33 or older with them. For treatment visits, patients cannot have anyone with them due to current Covid guidelines and our immunocompromised population.   Decitabine injection for infusion What is this medication? DECITABINE (dee SYE ta been) is a chemotherapy drug. This medicine reduces the growth of cancer cells. It is used to treat adults with myelodysplastic syndromes. This medicine may be used for other purposes; ask your health care provider or pharmacist if you have questions. COMMON BRAND NAME(S): Dacogen What  should I tell my care team before I take this medication? They need to know if you have any of these conditions: infection (especially a virus infection such as chickenpox, cold sores, or herpes) kidney disease liver disease an unusual or allergic reaction to decitabine, other medicines,  foods, dyes, or preservatives pregnant or trying to get pregnant breast-feeding How should I use this medication? This medicine is for infusion into a vein. It is administered in a hospital or clinic by a doctor or health care professional. Talk to your pediatrician regarding the use of this medicine in children. Special care may be needed. Overdosage: If you think you have taken too much of this medicine contact a poison control center or emergency room at once. NOTE: This medicine is only for you. Do not share this medicine with others. What if I miss a dose? It is important not to miss your dose. Call your doctor or health care professional if you are unable to keep an appointment. What may interact with this medication? vaccines Talk to your doctor or health care professional before taking any of these medicines: aspirin acetaminophen ibuprofen ketoprofen naproxen This list may not describe all possible interactions. Give your health care provider a list of all the medicines, herbs, non-prescription drugs, or dietary supplements you use. Also tell them if you smoke, drink alcohol, or use illegal drugs. Some items may interact with your medicine. What should I watch for while using this medication? Visit your doctor for checks on your progress. This drug may make you feel generally unwell. This is not uncommon, as chemotherapy can affect healthy cells as well as cancer cells. Report any side effects. Continue your course of treatment even though you feel ill unless your doctor tells you to stop. You may need blood work done while you are taking this medicine. In some cases, you may be given additional medicines to help with side effects. Follow all directions for their use. Call your doctor or health care professional for advice if you get a fever, chills or sore throat, or other symptoms of a cold or flu. Do not treat yourself. This drug decreases your body's ability to fight infections.  Try to avoid being around people who are sick. This medicine may increase your risk to bruise or bleed. Call your doctor or health care professional if you notice any unusual bleeding. Do not become pregnant while taking this medicine or for 6 months after stopping it. Women should inform their doctor if they wish to become pregnant or think they might be pregnant. Men should not father a child while taking this medicine and for 3 months after stopping it. There is a potential for serious side effects to an unborn child. Talk to your health care professional or pharmacist for more information. Do not breast-feed an infant while taking this medicine or for at least 2 weeks after stopping it. In males, this medicine may interfere with the ability to father a child. Talk with your doctor or health care professional if you are concerned about your fertility. What side effects may I notice from receiving this medication? Side effects that you should report to your doctor or health care professional as soon as possible: low blood counts - this medicine may decrease the number of white blood cells, red blood cells and platelets. You may be at increased risk for infections and bleeding. signs of infection - fever or chills, cough, sore throat, pain or difficulty passing urine signs of decreased platelets  or bleeding - bruising, pinpoint red spots on the skin, black, tarry stools, blood in the urine signs of decreased red blood cells - unusual weakness or tiredness, fainting spells, lightheadedness increased blood sugar Side effects that usually do not require medical attention (report to your doctor or health care professional if they continue or are bothersome): constipation diarrhea headache loss of appetite nausea, vomiting skin rash, itching stomach pain water retention weak or tired This list may not describe all possible side effects. Call your doctor for medical advice about side effects. You may  report side effects to FDA at 1-800-FDA-1088. Where should I keep my medication? This drug is given in a hospital or clinic and will not be stored at home. NOTE: This sheet is a summary. It may not cover all possible information. If you have questions about this medicine, talk to your doctor, pharmacist, or health care provider.  2022 Elsevier/Gold Standard (2018-07-05 13:32:17)

## 2021-03-09 ENCOUNTER — Other Ambulatory Visit: Payer: Self-pay

## 2021-03-09 ENCOUNTER — Inpatient Hospital Stay (HOSPITAL_COMMUNITY): Payer: Medicare Other

## 2021-03-09 VITALS — BP 144/76 | HR 64 | Temp 97.9°F | Resp 18

## 2021-03-09 DIAGNOSIS — Z95828 Presence of other vascular implants and grafts: Secondary | ICD-10-CM

## 2021-03-09 DIAGNOSIS — Z5111 Encounter for antineoplastic chemotherapy: Secondary | ICD-10-CM | POA: Diagnosis not present

## 2021-03-09 DIAGNOSIS — C9200 Acute myeloblastic leukemia, not having achieved remission: Secondary | ICD-10-CM

## 2021-03-09 DIAGNOSIS — D46Z Other myelodysplastic syndromes: Secondary | ICD-10-CM

## 2021-03-09 MED ORDER — ONDANSETRON HCL 4 MG/2ML IJ SOLN
4.0000 mg | Freq: Once | INTRAMUSCULAR | Status: AC
  Administered 2021-03-09: 4 mg via INTRAVENOUS
  Filled 2021-03-09: qty 2

## 2021-03-09 MED ORDER — SODIUM CHLORIDE 0.9% FLUSH
10.0000 mL | INTRAVENOUS | Status: DC | PRN
  Administered 2021-03-09: 10 mL

## 2021-03-09 MED ORDER — SODIUM CHLORIDE 0.9 % IV SOLN
15.0000 mg/m2 | Freq: Once | INTRAVENOUS | Status: AC
  Administered 2021-03-09: 35 mg via INTRAVENOUS
  Filled 2021-03-09: qty 7

## 2021-03-09 MED ORDER — SODIUM CHLORIDE 0.9 % IV SOLN: Freq: Once | INTRAVENOUS | Status: AC

## 2021-03-09 MED ORDER — HEPARIN SOD (PORK) LOCK FLUSH 100 UNIT/ML IV SOLN
500.0000 [IU] | Freq: Once | INTRAVENOUS | Status: AC | PRN
  Administered 2021-03-09: 500 [IU]

## 2021-03-09 NOTE — Progress Notes (Signed)
Patient presents today for Decitabine infusion per providers order.  Vital signs within parameters for treatment.  Patient has no new complaints at this time.    Decitabine infusion given today per MD orders.  Stable during infusion without adverse affects.  Vital signs stable.  No complaints at this time.  Discharge from clinic via wheelchair in stable condition.  Alert and oriented X 3.  Follow up with [facility] as scheduled.

## 2021-03-09 NOTE — Patient Instructions (Signed)
[place]  Discharge Instructions: Thank you for choosing [facility] to provide your oncology and hematology care.  If you have a lab appointment with the [facility], please come in thru the Main Entrance and check in at the main information desk.  Wear comfortable clothing and clothing appropriate for easy access to any Portacath or PICC line.   We strive to give you quality time with your provider. You may need to reschedule your appointment if you arrive late (15 or more minutes).  Arriving late affects you and other patients whose appointments are after yours.  Also, if you miss three or more appointments without notifying the office, you may be dismissed from the clinic at the provider's discretion.      For prescription refill requests, have your pharmacy contact our office and allow 72 hours for refills to be completed.    Today you received the following chemotherapy and/or immunotherapy agents Decitabine      To help prevent nausea and vomiting after your treatment, we encourage you to take your nausea medication as directed.  BELOW ARE SYMPTOMS THAT SHOULD BE REPORTED IMMEDIATELY: *FEVER GREATER THAN 100.4 F (38 C) OR HIGHER *CHILLS OR SWEATING *NAUSEA AND VOMITING THAT IS NOT CONTROLLED WITH YOUR NAUSEA MEDICATION *UNUSUAL SHORTNESS OF BREATH *UNUSUAL BRUISING OR BLEEDING *URINARY PROBLEMS (pain or burning when urinating, or frequent urination) *BOWEL PROBLEMS (unusual diarrhea, constipation, pain near the anus) TENDERNESS IN MOUTH AND THROAT WITH OR WITHOUT PRESENCE OF ULCERS (sore throat, sores in mouth, or a toothache) UNUSUAL RASH, SWELLING OR PAIN  UNUSUAL VAGINAL DISCHARGE OR ITCHING   Items with * indicate a potential emergency and should be followed up as soon as possible or go to the Emergency Department if any problems should occur.  Please show the CHEMOTHERAPY ALERT CARD or IMMUNOTHERAPY ALERT CARD at check-in to the Emergency  Department and triage nurse.  Should you have questions after your visit or need to cancel or reschedule your appointment, please contact [facility] [phone]  and follow the prompts.  Office hours are 8:00 a.m. to 4:30 p.m. Monday - Friday. Please note that voicemails left after 4:00 p.m. may not be returned until the following business day.  We are closed weekends and major holidays. You have access to a nurse at all times for urgent questions. Please call the main number to the clinic [phone] and follow the prompts.  For any non-urgent questions, you may also contact your provider using MyChart. We now offer e-Visits for anyone 45 and older to request care online for non-urgent symptoms. For details visit [email or web address].   Also download the MyChart app! Go to the app store, search "MyChart", open the app, select [place], and log in with your MyChart username and password.  Due to Covid, a mask is required upon entering the hospital/clinic. If you do not have a mask, one will be given to you upon arrival. For doctor visits, patients may have 1 support person aged 76 or older with them. For treatment visits, patients cannot have anyone with them due to current Covid guidelines and our immunocompromised population.

## 2021-03-10 ENCOUNTER — Inpatient Hospital Stay (HOSPITAL_COMMUNITY): Payer: Medicare Other

## 2021-03-10 VITALS — BP 134/78 | HR 67 | Temp 96.9°F | Resp 18

## 2021-03-10 DIAGNOSIS — Z5111 Encounter for antineoplastic chemotherapy: Secondary | ICD-10-CM | POA: Diagnosis not present

## 2021-03-10 DIAGNOSIS — C9200 Acute myeloblastic leukemia, not having achieved remission: Secondary | ICD-10-CM

## 2021-03-10 DIAGNOSIS — Z95828 Presence of other vascular implants and grafts: Secondary | ICD-10-CM

## 2021-03-10 DIAGNOSIS — D46Z Other myelodysplastic syndromes: Secondary | ICD-10-CM

## 2021-03-10 MED ORDER — HEPARIN SOD (PORK) LOCK FLUSH 100 UNIT/ML IV SOLN
500.0000 [IU] | Freq: Once | INTRAVENOUS | Status: AC | PRN
  Administered 2021-03-10: 500 [IU]

## 2021-03-10 MED ORDER — SODIUM CHLORIDE 0.9 % IV SOLN: Freq: Once | INTRAVENOUS | Status: AC

## 2021-03-10 MED ORDER — ONDANSETRON HCL 4 MG/2ML IJ SOLN
4.0000 mg | Freq: Once | INTRAMUSCULAR | Status: AC
  Administered 2021-03-10: 4 mg via INTRAVENOUS
  Filled 2021-03-10: qty 2

## 2021-03-10 MED ORDER — SODIUM CHLORIDE 0.9 % IV SOLN
15.0000 mg/m2 | Freq: Once | INTRAVENOUS | Status: AC
  Administered 2021-03-10: 35 mg via INTRAVENOUS
  Filled 2021-03-10: qty 7

## 2021-03-10 MED ORDER — SODIUM CHLORIDE 0.9% FLUSH
10.0000 mL | INTRAVENOUS | Status: DC | PRN
  Administered 2021-03-10: 10 mL

## 2021-03-10 NOTE — Progress Notes (Signed)
Patient presents today for Decitabine infusion per providers order.  Vital signs within parameters for treatment.  Patient has no new complaints at this time.  Decitabine infusion given today per MD orders.  Stable during infusion without adverse affects.  Vital signs stable.  No complaints at this time.  Discharge from clinic via wheelchair in stable condition.  Alert and oriented X 3.  Follow up with [facility] as scheduled.

## 2021-03-10 NOTE — Patient Instructions (Signed)
[facility]  Discharge Instructions: Thank you for choosing [facility] to provide your oncology and hematology care.  If you have a lab appointment with the [facility], please come in thru the Main Entrance and check in at the main information desk.  Wear comfortable clothing and clothing appropriate for easy access to any Portacath or PICC line.   We strive to give you quality time with your provider. You may need to reschedule your appointment if you arrive late (15 or more minutes).  Arriving late affects you and other patients whose appointments are after yours.  Also, if you miss three or more appointments without notifying the office, you may be dismissed from the clinic at the provider's discretion.      For prescription refill requests, have your pharmacy contact our office and allow 72 hours for refills to be completed.    Today you received the following chemotherapy and/or immunotherapy agents Decitabine      To help prevent nausea and vomiting after your treatment, we encourage you to take your nausea medication as directed.  BELOW ARE SYMPTOMS THAT SHOULD BE REPORTED IMMEDIATELY: *FEVER GREATER THAN 100.4 F (38 C) OR HIGHER *CHILLS OR SWEATING *NAUSEA AND VOMITING THAT IS NOT CONTROLLED WITH YOUR NAUSEA MEDICATION *UNUSUAL SHORTNESS OF BREATH *UNUSUAL BRUISING OR BLEEDING *URINARY PROBLEMS (pain or burning when urinating, or frequent urination) *BOWEL PROBLEMS (unusual diarrhea, constipation, pain near the anus) TENDERNESS IN MOUTH AND THROAT WITH OR WITHOUT PRESENCE OF ULCERS (sore throat, sores in mouth, or a toothache) UNUSUAL RASH, SWELLING OR PAIN  UNUSUAL VAGINAL DISCHARGE OR ITCHING   Items with * indicate a potential emergency and should be followed up as soon as possible or go to the Emergency Department if any problems should occur.  Please show the CHEMOTHERAPY ALERT CARD or IMMUNOTHERAPY ALERT CARD at check-in to the Emergency  Department and triage nurse.  Should you have questions after your visit or need to cancel or reschedule your appointment, please contact [facility] [phone]  and follow the prompts.  Office hours are 8:00 a.m. to 4:30 p.m. Monday - Friday. Please note that voicemails left after 4:00 p.m. may not be returned until the following business day.  We are closed weekends and major holidays. You have access to a nurse at all times for urgent questions. Please call the main number to the clinic [phone] and follow the prompts.  For any non-urgent questions, you may also contact your provider using MyChart. We now offer e-Visits for anyone 76 and older to request care online for non-urgent symptoms. For details visit [email or web address].   Also download the MyChart app! Go to the app store, search "MyChart", open the app, select [facility], and log in with your MyChart username and password.  Due to Covid, a mask is required upon entering the hospital/clinic. If you do not have a mask, one will be given to you upon arrival. For doctor visits, patients may have 1 support person aged 84 or older with them. For treatment visits, patients cannot have anyone with them due to current Covid guidelines and our immunocompromised population.

## 2021-03-11 ENCOUNTER — Inpatient Hospital Stay (HOSPITAL_COMMUNITY): Payer: Medicare Other

## 2021-03-11 ENCOUNTER — Other Ambulatory Visit: Payer: Self-pay

## 2021-03-11 VITALS — BP 132/78 | HR 70 | Temp 96.9°F | Resp 18

## 2021-03-11 DIAGNOSIS — D46Z Other myelodysplastic syndromes: Secondary | ICD-10-CM

## 2021-03-11 DIAGNOSIS — Z95828 Presence of other vascular implants and grafts: Secondary | ICD-10-CM

## 2021-03-11 DIAGNOSIS — Z5111 Encounter for antineoplastic chemotherapy: Secondary | ICD-10-CM | POA: Diagnosis not present

## 2021-03-11 DIAGNOSIS — C9200 Acute myeloblastic leukemia, not having achieved remission: Secondary | ICD-10-CM

## 2021-03-11 MED ORDER — SODIUM CHLORIDE 0.9% FLUSH
10.0000 mL | INTRAVENOUS | Status: DC | PRN
  Administered 2021-03-11: 10 mL

## 2021-03-11 MED ORDER — ONDANSETRON HCL 4 MG/2ML IJ SOLN
4.0000 mg | Freq: Once | INTRAMUSCULAR | Status: AC
  Administered 2021-03-11: 4 mg via INTRAVENOUS
  Filled 2021-03-11: qty 2

## 2021-03-11 MED ORDER — SODIUM CHLORIDE 0.9 % IV SOLN: Freq: Once | INTRAVENOUS | Status: AC

## 2021-03-11 MED ORDER — HEPARIN SOD (PORK) LOCK FLUSH 100 UNIT/ML IV SOLN
500.0000 [IU] | Freq: Once | INTRAVENOUS | Status: AC | PRN
  Administered 2021-03-11: 500 [IU]

## 2021-03-11 MED ORDER — SODIUM CHLORIDE 0.9 % IV SOLN
15.0000 mg/m2 | Freq: Once | INTRAVENOUS | Status: AC
  Administered 2021-03-11: 35 mg via INTRAVENOUS
  Filled 2021-03-11: qty 7

## 2021-03-11 NOTE — Progress Notes (Signed)
Treatment given per orders. Patient tolerated it well without problems. Vitals stable and discharged home from clinic ambulatory. Follow up as scheduled.  

## 2021-03-11 NOTE — Patient Instructions (Signed)
[facility]  Discharge Instructions: Thank you for choosing [facility] to provide your oncology and hematology care.  If you have a lab appointment with the Cancer Center, please come in thru the Main Entrance and check in at the main information desk.  Wear comfortable clothing and clothing appropriate for easy access to any Portacath or PICC line.   We strive to give you quality time with your provider. You may need to reschedule your appointment if you arrive late (15 or more minutes).  Arriving late affects you and other patients whose appointments are after yours.  Also, if you miss three or more appointments without notifying the office, you may be dismissed from the clinic at the provider's discretion.      For prescription refill requests, have your pharmacy contact our office and allow 72 hours for refills to be completed.        To help prevent nausea and vomiting after your treatment, we encourage you to take your nausea medication as directed.  BELOW ARE SYMPTOMS THAT SHOULD BE REPORTED IMMEDIATELY: *FEVER GREATER THAN 100.4 F (38 C) OR HIGHER *CHILLS OR SWEATING *NAUSEA AND VOMITING THAT IS NOT CONTROLLED WITH YOUR NAUSEA MEDICATION *UNUSUAL SHORTNESS OF BREATH *UNUSUAL BRUISING OR BLEEDING *URINARY PROBLEMS (pain or burning when urinating, or frequent urination) *BOWEL PROBLEMS (unusual diarrhea, constipation, pain near the anus) TENDERNESS IN MOUTH AND THROAT WITH OR WITHOUT PRESENCE OF ULCERS (sore throat, sores in mouth, or a toothache) UNUSUAL RASH, SWELLING OR PAIN  UNUSUAL VAGINAL DISCHARGE OR ITCHING   Items with * indicate a potential emergency and should be followed up as soon as possible or go to the Emergency Department if any problems should occur.  Please show the CHEMOTHERAPY ALERT CARD or IMMUNOTHERAPY ALERT CARD at check-in to the Emergency Department and triage nurse.  Should you have questions after your visit or need to cancel  or reschedule your appointment, please contact [facility] [phone]  and follow the prompts.  Office hours are 8:00 a.m. to 4:30 p.m. Monday - Friday. Please note that voicemails left after 4:00 p.m. may not be returned until the following business day.  We are closed weekends and major holidays. You have access to a nurse at all times for urgent questions. Please call the main number to the clinic [phone] and follow the prompts.  For any non-urgent questions, you may also contact your provider using MyChart. We now offer e-Visits for anyone 18 and older to request care online for non-urgent symptoms. For details visit [email or web address].   Also download the MyChart app! Go to the app store, search "MyChart", open the app, select [facility], and log in with your MyChart username and password.  Due to Covid, a mask is required upon entering the hospital/clinic. If you do not have a mask, one will be given to you upon arrival. For doctor visits, patients may have 1 support person aged 18 or older with them. For treatment visits, patients cannot have anyone with them due to current Covid guidelines and our immunocompromised population.  

## 2021-03-15 ENCOUNTER — Other Ambulatory Visit (HOSPITAL_COMMUNITY): Payer: Self-pay

## 2021-03-21 ENCOUNTER — Other Ambulatory Visit (HOSPITAL_COMMUNITY): Payer: Self-pay

## 2021-03-21 MED ORDER — HYDROCODONE-ACETAMINOPHEN 10-325 MG PO TABS: 1.0000 | ORAL_TABLET | Freq: Four times a day (QID) | ORAL | 0 refills | Status: DC | PRN

## 2021-04-14 ENCOUNTER — Other Ambulatory Visit (HOSPITAL_COMMUNITY): Payer: Self-pay

## 2021-04-24 NOTE — Progress Notes (Signed)
Richard Wallace, Richard Wallace   CLINIC:  Medical Oncology/Hematology  PCP:  [name], MD [address] 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[place])   08/22/2018 - 12/06/2018 Chemotherapy   The patient had palonosetron (ALOXI) injection 0.25 mg, 0.25 mg, Intravenous,  Once, 4 of 6 cycles Administration: 0.25 mg (08/22/2018), 0.25 mg (08/26/2018), 0.25 mg (08/28/2018), 0.25 mg (08/30/2018), 0.25 mg (10/01/2018), 0.25 mg (10/02/2018), 0.25 mg (11/04/2018), 0.25 mg (09/23/2018), 0.25 mg (09/25/2018), 0.25 mg (09/27/2018), 0.25 mg (10/28/2018), 0.25 mg (10/30/2018), 0.25 mg (11/01/2018), 0.25 mg (12/02/2018), 0.25 mg (12/04/2018), 0.25 mg (12/06/2018) azaCITIDine (VIDAZA) 100 mg in sodium chloride 0.9 % 50 mL chemo infusion, 110 mg (66.7 % of original dose 75 mg/m2), Intravenous, Once, 4 of 6 cycles Dose modification: 50 mg/m2 (original dose 75 mg/m2, Cycle 1, Reason: Provider Judgment), 50 mg/m2 (original dose 75 mg/m2, Cycle 2, Reason: Provider Judgment) Administration: 100 mg (08/22/2018), 100 mg (08/23/2018), 100 mg (08/26/2018), 100 mg (08/27/2018), 100 mg (08/28/2018), 100 mg (08/29/2018), 100 mg (08/30/2018), 100 mg (10/01/2018), 100 mg (10/02/2018), 100 mg (11/04/2018), 100 mg (11/05/2018), 100 mg (09/23/2018), 100 mg (09/24/2018), 100 mg (09/25/2018), 100 mg (09/26/2018), 100 mg (09/27/2018), 100 mg (10/28/2018), 100 mg (10/29/2018), 100 mg (10/30/2018), 100 mg (10/31/2018), 100 mg (11/01/2018), 100 mg (12/02/2018), 100 mg (12/03/2018), 100 mg (12/04/2018), 100 mg (12/05/2018), 100 mg (12/06/2018)   for chemotherapy treatment.     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  INTERVAL HISTORY:  [name], a 75 y.o. male, returns for routine follow-up and consideration for next cycle of chemotherapy. Richard Wallace was last seen on 03/07/2021.  Due for cycle #20 of Decitabine today.   Overall, he tells me he has been feeling pretty well. He denies recent fatigue, infections, and n/v/d. His appetite is good.   Overall, he feels ready for next cycle of chemo today.   REVIEW OF SYSTEMS:  Review of Systems  Constitutional:  Negative for appetite change (90%) and fatigue (60%).  Gastrointestinal:  Positive for constipation. Negative for diarrhea, nausea and vomiting.  Musculoskeletal:  Positive for arthralgias (0-8/10).  All other systems reviewed and are negative.  PAST MEDICAL/SURGICAL HISTORY:  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   Cancer ([place]) 01-28-2014   skin cancer   CKD (chronic kidney disease), stage III (HCC)    GERD (gastroesophageal reflux disease)    Heart murmur    NOTED DURING PHYSICAL WHEN HE WAS ENLISTING IN [facility] , DIDNT KNOW UNTIL THAT TIME AND REPORTS , "THATS THE LAST I HEARD ABOUT IT "    History of hypertension    no longer issue  History of kidney stones    History of malignant melanoma of skin    excision top of scalp 2015-- no recurrence   History of urinary retention    post op lumbar fusion surgery 04/ 2016   Hypertension    Kidney dysfunction    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, MONITORED BY [facility] DR Annie Main DAHLSTEDT    Left ureteral calculus    Seasonal  allergies    Wears glasses    Wears glasses    Wears partial dentures    upper and lower   Past Surgical History:  Procedure Laterality Date   ANKLE FUSION Right 2007   CARPAL TUNNEL RELEASE Left 12/28/2009   w/ pulley release left long finger   CARPAL TUNNEL RELEASE Right 07/22/2013   Procedure: RIGHT CARPAL TUNNEL RELEASE;  Surgeon: Cammie Sickle., MD;  Location: [facility];  Service: Orthopedics;  Laterality: Right;   COLONOSCOPY     CYSTO/  LEFT RETROGRADE PYELOGRAM  11/21/2010   CYSTOSCOPY WITH STENT PLACEMENT Left 03/09/2016   Procedure: CYSTOSCOPY WITH STENT PLACEMENT;  Surgeon: Franchot Gallo, MD;  Location: [facility];  Service: Urology;  Laterality: Left;   CYSTOSCOPY/RETROGRADE/URETEROSCOPY/STONE EXTRACTION WITH BASKET Left 03/09/2016   Procedure: CYSTOSCOPY/RETROGRADE/URETEROSCOPY/STONE EXTRACTION WITH BASKET;  Surgeon: Franchot Gallo, MD;  Location: [facility];  Service: Urology;  Laterality: Left;   LEFT URETEROSCOPIC LASER LITHOTRIPSY STONE EXTRACTION/ STENT PLACEMENT  05/23/2010   MOHS SURGERY     TOP OF THE HEAD    ORIF ANKLE FRACTURE Right 1978   PORT-A-CATH REMOVAL Right 02/14/2019   Procedure: MINOR REMOVAL PORT-A-CATH;  Surgeon: Aviva Signs, MD;  Location: [facility];  Service: General;  Laterality: Right;   PORTACATH PLACEMENT Right 08/19/2018   Procedure: INSERTION PORT-A-CATH (attached catheter in right subclavian);  Surgeon: Aviva Signs, MD;  Location: [facility];  Service: General;  Laterality: Right;   PORTACATH PLACEMENT Left 05/16/2019   Procedure: INSERTION PORT-A-CATH (attached catheter in left subclavian);  Surgeon: Aviva Signs, MD;  Location: [facility];  Service: General;  Laterality: Left;   POSTERIOR LUMBAR FUSION  08/21/2014   laminectomy and decompression L2 -- L5   RIGHT LOWER LEG SURGERY  X3  1975 to 1976   including ORIF   TONSILLECTOMY AND ADENOIDECTOMY  9323   UMBILICAL HERNIA REPAIR  2009 approx     SOCIAL HISTORY:  Social History   Socioeconomic History   Marital status: Married    Spouse name: Not on file   Number of children: Not on file   Years of education: Not on file   Highest education level: Not on file  Occupational History   Not on file  Tobacco Use   Smoking status: Former    Years: 20.00    Types: Cigarettes    Quit date: 07/17/1986    Years since quitting: 34.7   Smokeless tobacco: Never  Vaping Use   Vaping Use: Never used  Substance and Sexual Activity   Alcohol use: Yes    Alcohol/week: 7.0 - 14.0 standard drinks    Types: 7 - 14 Cans of beer per week    Comment: 1 -2 beer daily   Drug use: No   Sexual activity: Not Currently  Other Topics Concern   Not on file  Social History Narrative   Not on file   Social Determinants of Health   Financial Resource Strain: Not on file  Food Insecurity: Not on file  Transportation Needs: Not on file  Physical Activity: Not on file  Stress: Not on file  Social Connections: Not on file  Intimate Partner Violence: Not on file    FAMILY HISTORY:  Family History  Problem Relation Age of Onset   Stroke Mother    Prostate cancer Father    Bone cancer Father    Diverticulitis Father    Rheum arthritis Sister    Urinary tract infection Sister    Colon cancer Neg Hx     CURRENT MEDICATIONS:  Current Outpatient Medications  Medication Sig Dispense Refill   allopurinol (ZYLOPRIM) 300 MG tablet Take 1 tablet (300 mg total) by mouth at bedtime. 90 tablet 3   ALPRAZolam (XANAX) 0.25 MG tablet Take 1 tablet by mouth twice daily as needed for anxiety 60 tablet 0   amLODipine (NORVASC) 2.5 MG tablet Take 1 tablet (2.5 mg total) by mouth daily. 90 tablet 2   cetirizine (ZYRTEC) 10 MG tablet Take 10 mg by mouth daily. IN THE MORNING     diclofenac Sodium (VOLTAREN) 1 % GEL Apply three times daily to knees as needed for pain 50 g 3   docusate sodium (COLACE) 100 MG capsule Take 100 mg by mouth at bedtime.       donepezil (ARICEPT) 10 MG tablet Take 1 tablet (10 mg total) by mouth at bedtime. 30 tablet 3   HYDROcodone-acetaminophen (NORCO) 10-325 MG tablet Take 1 tablet by mouth every 6 (six) hours as needed. 112 tablet 0   ipratropium (ATROVENT) 0.03 % nasal spray Place 2 sprays into both nostrils 3 (three) times daily.     lansoprazole (PREVACID) 15 MG capsule Take 15 mg by mouth daily.      lidocaine (LIDODERM) 5 % Place 1 patch onto the skin daily. Remove & Discard patch within 12 hours or as directed by MD 30 patch 0   magnesium oxide (MAG-OX) 400 (240 Mg) MG tablet TAKE 1 TABLET BY MOUTH THREE TIMES DAILY 90 tablet 6   magnesium oxide (MAG-OX) 400 (241.3 Mg) MG tablet Take 1 tablet (400 mg total) by mouth 3 (three) times daily. (Patient taking differently: Take 1 tablet by mouth 4 (four) times daily.) 90 tablet 3   SF 5000 PLUS 1.1 % CREA dental cream SMARTSIG:Sparingly By Mouth Every Night     tamsulosin (FLOMAX) 0.4 MG CAPS capsule Take 1 capsule (0.4 mg total) by mouth every evening. 30 capsule 6   venetoclax (VENCLEXTA) 100 MG tablet TAKE 2 TABLETS (200 MG) BY MOUTH DAILY 60 tablet 2   lidocaine-prilocaine (EMLA) cream Apply 1 application topically See admin instructions. ONE HOUR PRIOR TO CHEMOTHERAPY APPOINTMENT (Patient not taking: Reported on 04/25/2021) 30 g 6   No current facility-administered medications for this visit.   Facility-Administered Medications Ordered in Other Visits  Medication Dose Route Frequency Provider Last Rate Last Admin   sodium chloride flush (NS) 0.9 % injection 10 mL  10 mL Intracatheter PRN [name], MD   10 mL at 11/21/19 1030   sodium chloride flush (NS) 0.9 % injection 20 mL  20 mL Intravenous PRN [name], MD   20 mL at 07/21/19 1053    ALLERGIES:  No Known Allergies  PHYSICAL EXAM:  Performance status (ECOG): 1 - Symptomatic but completely ambulatory  Vitals:   04/25/21 1102  BP: (!) 143/87  Pulse: 69  Resp: 16  Temp: 97.7  F (36.5 C)  SpO2: 92%   Wt Readings from Last 3 Encounters:  04/25/21 218 lb 11.1 oz (99.2 kg)  03/07/21 217 lb 1.6 oz (98.5 kg)  01/24/21 215 lb 3.2 oz (97.6 kg)   Physical Exam Vitals reviewed.  Constitutional:      Appearance: Normal appearance. He is obese.     Comments: In wheelchair  Cardiovascular:     Rate and Rhythm: Normal rate and regular rhythm.     Pulses: Normal pulses.     Heart sounds: Normal heart sounds.  Pulmonary:     Effort: Pulmonary effort is normal.     Breath sounds: Normal breath sounds.  Neurological:     General: No focal deficit present.     Mental Status: He is alert and oriented to person, place, and time.  Psychiatric:        Mood and Affect: Mood normal.        Behavior: Behavior normal.    LABORATORY DATA:  I have reviewed the labs as listed.  CBC Latest Ref Rng & Units 04/25/2021 03/07/2021 01/24/2021  WBC 4.0 - 10.5 K/uL 5.6 4.7 4.2  Hemoglobin 13.0 - 17.0 g/dL 15.3 15.3 14.8  Hematocrit 39.0 - 52.0 % 47.2 46.9 45.3  Platelets 150 - 400 K/uL 120(L) 136(L) 130(L)   CMP Latest Ref Rng & Units 04/25/2021 03/07/2021 01/24/2021  Glucose 70 - 99 mg/dL 112(H) 93 111(H)  BUN 8 - 23 mg/dL 25(H) 21 20  Creatinine 0.61 - 1.24 mg/dL 1.04 1.03 1.07  Sodium 135 - 145 mmol/L 138 140 138  Potassium 3.5 - 5.1 mmol/L 4.3 4.5 4.8  Chloride 98 - 111 mmol/L 105 105 105  CO2 22 - 32 mmol/L 26 28 28   Calcium 8.9 - 10.3 mg/dL 9.2 9.3 8.9  Total Protein 6.5 - 8.1 g/dL 6.4(L) 6.5 6.2(L)  Total Bilirubin 0.3 - 1.2 mg/dL 0.4 0.6 0.6  Alkaline Phos 38 - 126 U/L 65 73 74  AST 15 - 41 U/L 22 26 22   ALT 0 - 44 U/L 25 28 26     DIAGNOSTIC IMAGING:  I have independently reviewed the scans and discussed with the patient. No results found.   ASSESSMENT:  1.  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  -BMBX on 02/25/2019 after cycle 1 did not show any evidence of  leukemia. -CT CAP on 02/12/2019 shows numerous bilateral irregular/spiculated pulmonary nodules measuring up to 14 mm, nonspecific.  Hepatic steatosis.  Spleen is normal. -I have talked to Dr.Ravandi at [facility] per patient request.  There are clinical trials available upon progression of his AML.  There are also some clinical trials available now based on MRD positivity.  Patient not interested in moving to [place] at this time.     PLAN:  1.  AML: - He does not report any infections or hospitalizations in the past 6 weeks. - Denies any GI side effects from venetoclax. - Reviewed labs today which showed normal LFTs and renal function.  CBC shows white count 5.6 with normal differential.  Mild thrombocytopenia with platelet count 120 stable.  Hemoglobin was normal at 15.3.  LDH was normal. - Proceed with the next cycle of azacitidine today.  He will start venetoclax 200 mg daily for the next 2 weeks. - RTC 6 weeks for follow-up.   2.  Arthralgias: - Continue hydrocodone 10/325 every 6 hours as needed.   3.  Hypomagnesemia: - Continue magnesium 3 times daily.  Magnesium today is 1.9.   4.  Hypertension: - Continue Norvasc 2.5 mg  daily.  Blood pressure is 140/87.   5.  Dementia: - Continue Aricept 10 mg daily.    Orders placed this encounter:  No orders of the defined types were placed in this encounter.    [name], MD [place] [phone]   I, Thana Ates, am acting as a scribe for Dr. [name].  I, [name] MD, have reviewed the above documentation for accuracy and completeness, and I agree with the above.

## 2021-04-25 ENCOUNTER — Inpatient Hospital Stay (HOSPITAL_COMMUNITY): Payer: Medicare Other

## 2021-04-25 ENCOUNTER — Inpatient Hospital Stay (HOSPITAL_COMMUNITY): Payer: Medicare Other | Attending: Hematology | Admitting: Hematology

## 2021-04-25 ENCOUNTER — Other Ambulatory Visit: Payer: Self-pay

## 2021-04-25 VITALS — BP 149/87 | HR 63 | Temp 97.6°F | Resp 18

## 2021-04-25 VITALS — BP 143/87 | HR 69 | Temp 97.7°F | Resp 16 | Ht 69.5 in | Wt 218.7 lb

## 2021-04-25 DIAGNOSIS — C9200 Acute myeloblastic leukemia, not having achieved remission: Secondary | ICD-10-CM

## 2021-04-25 DIAGNOSIS — Z79899 Other long term (current) drug therapy: Secondary | ICD-10-CM | POA: Insufficient documentation

## 2021-04-25 DIAGNOSIS — I129 Hypertensive chronic kidney disease with stage 1 through stage 4 chronic kidney disease, or unspecified chronic kidney disease: Secondary | ICD-10-CM | POA: Insufficient documentation

## 2021-04-25 DIAGNOSIS — R918 Other nonspecific abnormal finding of lung field: Secondary | ICD-10-CM | POA: Diagnosis not present

## 2021-04-25 DIAGNOSIS — D696 Thrombocytopenia, unspecified: Secondary | ICD-10-CM | POA: Diagnosis not present

## 2021-04-25 DIAGNOSIS — N183 Chronic kidney disease, stage 3 unspecified: Secondary | ICD-10-CM | POA: Insufficient documentation

## 2021-04-25 DIAGNOSIS — K760 Fatty (change of) liver, not elsewhere classified: Secondary | ICD-10-CM | POA: Insufficient documentation

## 2021-04-25 DIAGNOSIS — K219 Gastro-esophageal reflux disease without esophagitis: Secondary | ICD-10-CM | POA: Insufficient documentation

## 2021-04-25 DIAGNOSIS — Z5111 Encounter for antineoplastic chemotherapy: Secondary | ICD-10-CM | POA: Diagnosis not present

## 2021-04-25 DIAGNOSIS — D46Z Other myelodysplastic syndromes: Secondary | ICD-10-CM

## 2021-04-25 DIAGNOSIS — Z95828 Presence of other vascular implants and grafts: Secondary | ICD-10-CM

## 2021-04-25 LAB — CBC WITH DIFFERENTIAL/PLATELET
Abs Immature Granulocytes: 0.11 10*3/uL — ABNORMAL HIGH (ref 0.00–0.07)
Basophils Absolute: 0 10*3/uL (ref 0.0–0.1)
Basophils Relative: 1 %
Eosinophils Absolute: 0.2 10*3/uL (ref 0.0–0.5)
Eosinophils Relative: 3 %
HCT: 47.2 % (ref 39.0–52.0)
Hemoglobin: 15.3 g/dL (ref 13.0–17.0)
Immature Granulocytes: 2 %
Lymphocytes Relative: 23 %
Lymphs Abs: 1.3 10*3/uL (ref 0.7–4.0)
MCH: 31.5 pg (ref 26.0–34.0)
MCHC: 32.4 g/dL (ref 30.0–36.0)
MCV: 97.1 fL (ref 80.0–100.0)
Monocytes Absolute: 0.5 10*3/uL (ref 0.1–1.0)
Monocytes Relative: 8 %
Neutro Abs: 3.6 10*3/uL (ref 1.7–7.7)
Neutrophils Relative %: 63 %
Platelets: 120 10*3/uL — ABNORMAL LOW (ref 150–400)
RBC: 4.86 MIL/uL (ref 4.22–5.81)
RDW: 15.3 % (ref 11.5–15.5)
WBC: 5.6 10*3/uL (ref 4.0–10.5)
nRBC: 0 % (ref 0.0–0.2)

## 2021-04-25 LAB — COMPREHENSIVE METABOLIC PANEL
ALT: 25 U/L (ref 0–44)
AST: 22 U/L (ref 15–41)
Albumin: 3.8 g/dL (ref 3.5–5.0)
Alkaline Phosphatase: 65 U/L (ref 38–126)
Anion gap: 7 (ref 5–15)
BUN: 25 mg/dL — ABNORMAL HIGH (ref 8–23)
CO2: 26 mmol/L (ref 22–32)
Calcium: 9.2 mg/dL (ref 8.9–10.3)
Chloride: 105 mmol/L (ref 98–111)
Creatinine, Ser: 1.04 mg/dL (ref 0.61–1.24)
GFR, Estimated: >60 mL/min (ref >60)
Glucose, Bld: 112 mg/dL — ABNORMAL HIGH (ref 70–99)
Potassium: 4.3 mmol/L (ref 3.5–5.1)
Sodium: 138 mmol/L (ref 135–145)
Total Bilirubin: 0.4 mg/dL (ref 0.3–1.2)
Total Protein: 6.4 g/dL — ABNORMAL LOW (ref 6.5–8.1)

## 2021-04-25 LAB — MAGNESIUM: Magnesium: 1.9 mg/dL (ref 1.7–2.4)

## 2021-04-25 LAB — LACTATE DEHYDROGENASE: LDH: 141 U/L (ref 98–192)

## 2021-04-25 MED ORDER — SODIUM CHLORIDE 0.9 % IV SOLN
15.0000 mg/m2 | Freq: Once | INTRAVENOUS | Status: AC
  Administered 2021-04-25: 13:00:00 35 mg via INTRAVENOUS
  Filled 2021-04-25: qty 7

## 2021-04-25 MED ORDER — ONDANSETRON HCL 4 MG/2ML IJ SOLN
4.0000 mg | Freq: Once | INTRAMUSCULAR | Status: AC
  Administered 2021-04-25: 12:00:00 4 mg via INTRAVENOUS
  Filled 2021-04-25: qty 2

## 2021-04-25 MED ORDER — HEPARIN SOD (PORK) LOCK FLUSH 100 UNIT/ML IV SOLN
500.0000 [IU] | Freq: Once | INTRAVENOUS | Status: AC | PRN
  Administered 2021-04-25: 14:00:00 500 [IU]

## 2021-04-25 MED ORDER — SODIUM CHLORIDE 0.9% FLUSH
10.0000 mL | INTRAVENOUS | Status: DC | PRN
  Administered 2021-04-25: 14:00:00 10 mL

## 2021-04-25 MED ORDER — SODIUM CHLORIDE 0.9 % IV SOLN: Freq: Once | INTRAVENOUS | Status: AC

## 2021-04-25 NOTE — Progress Notes (Signed)
Pt presents today for Decitabine per provider's order. Vital signs and labs WNL treatment for today. Okay to proceed with treatment today per Dr.K. Pt voiced no new complaints at this time.  Decitabine given today per MD orders. Tolerated infusion without adverse affects. Vital signs stable. No complaints at this time. Discharged from clinic via motorized chair in stable condition. Alert and oriented x 3. F/U with [facility] as scheduled.

## 2021-04-25 NOTE — Progress Notes (Signed)
Patient is taking Venetoclax as prescribed.  He has not missed any doses and reports no side effects at this time.    Patient has been examined by Dr. Delton Coombes, and vital signs and labs have been reviewed. ANC, Creatinine, LFTs, hemoglobin, and platelets are within treatment parameters per M.D. - pt may proceed with treatment.

## 2021-04-25 NOTE — Patient Instructions (Signed)
[place] at [facility] Discharge Instructions   You were seen and examined today by Dr. Delton Coombes. He reviewed your lab results which were normal/stable. We will proceed with your treatment today and the rest of the week. Return as scheduled in 6 weeks for lab work, office visit, and treatment.    Thank you for choosing [place] at [facility] to provide your oncology and hematology care.  To afford each patient quality time with our provider, please arrive at least 15 minutes before your scheduled appointment time.   If you have a lab appointment with the [facility] please come in thru the Main Entrance and check in at the main information desk.  You need to re-schedule your appointment should you arrive 10 or more minutes late.  We strive to give you quality time with our providers, and arriving late affects you and other patients whose appointments are after yours.  Also, if you no show three or more times for appointments you may be dismissed from the clinic at the providers discretion.     Again, thank you for choosing [facility].  Our hope is that these requests will decrease the amount of time that you wait before being seen by our physicians.       _____________________________________________________________  Should you have questions after your visit to [facility], please contact our office at [phone] and follow the prompts.  Our office hours are 8:00 a.m. and 4:30 p.m. Monday - Friday.  Please note that voicemails left after 4:00 p.m. may not be returned until the following business day.  We are closed weekends and major holidays.  You do have access to a nurse 24-7, just call the main number to the clinic [phone] and do not press any options, hold on the line and a nurse will answer the phone.    For prescription refill requests, have your pharmacy contact our office and allow 72 hours.     Due to Covid, you will need to wear a mask upon entering the hospital. If you do not have a mask, a mask will be given to you at the Main Entrance upon arrival. For doctor visits, patients may have 1 support person age 76 or older with them. For treatment visits, patients can not have anyone with them due to social distancing guidelines and our immunocompromised population.

## 2021-04-25 NOTE — Patient Instructions (Signed)
[facility]  Discharge Instructions: Thank you for choosing [facility] to provide your oncology and hematology care.  If you have a lab appointment with the [facility], please come in thru the Main Entrance and check in at the main information desk.  Wear comfortable clothing and clothing appropriate for easy access to any Portacath or PICC line.   We strive to give you quality time with your provider. You may need to reschedule your appointment if you arrive late (15 or more minutes).  Arriving late affects you and other patients whose appointments are after yours.  Also, if you miss three or more appointments without notifying the office, you may be dismissed from the clinic at the providers discretion.      For prescription refill requests, have your pharmacy contact our office and allow 72 hours for refills to be completed.    Today you received the following chemotherapy and/or immunotherapy agents Decitabine   To help prevent nausea and vomiting after your treatment, we encourage you to take your nausea medication as directed.  BELOW ARE SYMPTOMS THAT SHOULD BE REPORTED IMMEDIATELY: *FEVER GREATER THAN 100.4 F (38 C) OR HIGHER *CHILLS OR SWEATING *NAUSEA AND VOMITING THAT IS NOT CONTROLLED WITH YOUR NAUSEA MEDICATION *UNUSUAL SHORTNESS OF BREATH *UNUSUAL BRUISING OR BLEEDING *URINARY PROBLEMS (pain or burning when urinating, or frequent urination) *BOWEL PROBLEMS (unusual diarrhea, constipation, pain near the anus) TENDERNESS IN MOUTH AND THROAT WITH OR WITHOUT PRESENCE OF ULCERS (sore throat, sores in mouth, or a toothache) UNUSUAL RASH, SWELLING OR PAIN  UNUSUAL VAGINAL DISCHARGE OR ITCHING   Items with * indicate a potential emergency and should be followed up as soon as possible or go to the Emergency Department if any problems should occur.  Please show the CHEMOTHERAPY ALERT CARD or IMMUNOTHERAPY ALERT CARD at check-in to the Emergency  Department and triage nurse.  Should you have questions after your visit or need to cancel or reschedule your appointment, please contact [facility] [phone]  and follow the prompts.  Office hours are 8:00 a.m. to 4:30 p.m. Monday - Friday. Please note that voicemails left after 4:00 p.m. may not be returned until the following business day.  We are closed weekends and major holidays. You have access to a nurse at all times for urgent questions. Please call the main number to the clinic [phone] and follow the prompts.  For any non-urgent questions, you may also contact your provider using MyChart. We now offer e-Visits for anyone 69 and older to request care online for non-urgent symptoms. For details visit [email or web address].   Also download the MyChart app! Go to the app store, search "MyChart", open the app, select [facility], and log in with your MyChart username and password.  Due to Covid, a mask is required upon entering the hospital/clinic. If you do not have a mask, one will be given to you upon arrival. For doctor visits, patients may have 1 support person aged 4 or older with them. For treatment visits, patients cannot have anyone with them due to current Covid guidelines and our immunocompromised population.

## 2021-04-26 ENCOUNTER — Other Ambulatory Visit (HOSPITAL_COMMUNITY): Payer: Self-pay

## 2021-04-26 ENCOUNTER — Inpatient Hospital Stay (HOSPITAL_COMMUNITY): Payer: Medicare Other

## 2021-04-26 VITALS — BP 148/91 | HR 69 | Temp 98.1°F | Resp 18

## 2021-04-26 DIAGNOSIS — C9200 Acute myeloblastic leukemia, not having achieved remission: Secondary | ICD-10-CM

## 2021-04-26 DIAGNOSIS — Z95828 Presence of other vascular implants and grafts: Secondary | ICD-10-CM

## 2021-04-26 DIAGNOSIS — D46Z Other myelodysplastic syndromes: Secondary | ICD-10-CM

## 2021-04-26 DIAGNOSIS — Z5111 Encounter for antineoplastic chemotherapy: Secondary | ICD-10-CM | POA: Diagnosis not present

## 2021-04-26 MED ORDER — SODIUM CHLORIDE 0.9% FLUSH
10.0000 mL | INTRAVENOUS | Status: DC | PRN
  Administered 2021-04-26: 11:00:00 10 mL

## 2021-04-26 MED ORDER — SODIUM CHLORIDE 0.9 % IV SOLN: Freq: Once | INTRAVENOUS | Status: AC

## 2021-04-26 MED ORDER — HEPARIN SOD (PORK) LOCK FLUSH 100 UNIT/ML IV SOLN
500.0000 [IU] | Freq: Once | INTRAVENOUS | Status: AC | PRN
  Administered 2021-04-26: 11:00:00 500 [IU]

## 2021-04-26 MED ORDER — AMLODIPINE BESYLATE 2.5 MG PO TABS: 2.5000 mg | ORAL_TABLET | Freq: Every day | ORAL | 2 refills | Status: DC

## 2021-04-26 MED ORDER — ONDANSETRON HCL 4 MG/2ML IJ SOLN
4.0000 mg | Freq: Once | INTRAMUSCULAR | Status: AC
  Administered 2021-04-26: 09:00:00 4 mg via INTRAVENOUS
  Filled 2021-04-26: qty 2

## 2021-04-26 MED ORDER — SODIUM CHLORIDE 0.9 % IV SOLN
15.0000 mg/m2 | Freq: Once | INTRAVENOUS | Status: AC
  Administered 2021-04-26: 10:00:00 35 mg via INTRAVENOUS
  Filled 2021-04-26: qty 7

## 2021-04-26 MED ORDER — HYDROCODONE-ACETAMINOPHEN 10-325 MG PO TABS: 1.0000 | ORAL_TABLET | Freq: Four times a day (QID) | ORAL | 0 refills | Status: DC | PRN

## 2021-04-26 NOTE — Progress Notes (Signed)
Pt presents today for treatment per provider's order. Vital signs stable and pt voiced no new complaints at this time. Okay for treatment today.  Decitabine given today per MD orders. Tolerated infusion without adverse affects. Vital signs stable. No complaints at this time. Discharged from clinic via motorized chair in stable condition. Alert and oriented x 3. F/U with [facility] as scheduled.

## 2021-04-26 NOTE — Patient Instructions (Signed)
[facility]  Discharge Instructions: Thank you for choosing [facility] to provide your oncology and hematology care.  If you have a lab appointment with the [facility], please come in thru the Main Entrance and check in at the main information desk.  Wear comfortable clothing and clothing appropriate for easy access to any Portacath or PICC line.   We strive to give you quality time with your provider. You may need to reschedule your appointment if you arrive late (15 or more minutes).  Arriving late affects you and other patients whose appointments are after yours.  Also, if you miss three or more appointments without notifying the office, you may be dismissed from the clinic at the providers discretion.      For prescription refill requests, have your pharmacy contact our office and allow 72 hours for refills to be completed.    Today you received the following chemotherapy and/or immunotherapy agents Decitabine.   To help prevent nausea and vomiting after your treatment, we encourage you to take your nausea medication as directed.  BELOW ARE SYMPTOMS THAT SHOULD BE REPORTED IMMEDIATELY: *FEVER GREATER THAN 100.4 F (38 C) OR HIGHER *CHILLS OR SWEATING *NAUSEA AND VOMITING THAT IS NOT CONTROLLED WITH YOUR NAUSEA MEDICATION *UNUSUAL SHORTNESS OF BREATH *UNUSUAL BRUISING OR BLEEDING *URINARY PROBLEMS (pain or burning when urinating, or frequent urination) *BOWEL PROBLEMS (unusual diarrhea, constipation, pain near the anus) TENDERNESS IN MOUTH AND THROAT WITH OR WITHOUT PRESENCE OF ULCERS (sore throat, sores in mouth, or a toothache) UNUSUAL RASH, SWELLING OR PAIN  UNUSUAL VAGINAL DISCHARGE OR ITCHING   Items with * indicate a potential emergency and should be followed up as soon as possible or go to the Emergency Department if any problems should occur.  Please show the CHEMOTHERAPY ALERT CARD or IMMUNOTHERAPY ALERT CARD at check-in to the Emergency  Department and triage nurse.  Should you have questions after your visit or need to cancel or reschedule your appointment, please contact [facility] [phone]  and follow the prompts.  Office hours are 8:00 a.m. to 4:30 p.m. Monday - Friday. Please note that voicemails left after 4:00 p.m. may not be returned until the following business day.  We are closed weekends and major holidays. You have access to a nurse at all times for urgent questions. Please call the main number to the clinic [phone] and follow the prompts.  For any non-urgent questions, you may also contact your provider using MyChart. We now offer e-Visits for anyone 83 and older to request care online for non-urgent symptoms. For details visit [email or web address].   Also download the MyChart app! Go to the app store, search "MyChart", open the app, select [facility], and log in with your MyChart username and password.  Due to Covid, a mask is required upon entering the hospital/clinic. If you do not have a mask, one will be given to you upon arrival. For doctor visits, patients may have 1 support person aged 63 or older with them. For treatment visits, patients cannot have anyone with them due to current Covid guidelines and our immunocompromised population.

## 2021-04-27 ENCOUNTER — Other Ambulatory Visit: Payer: Self-pay

## 2021-04-27 ENCOUNTER — Inpatient Hospital Stay (HOSPITAL_COMMUNITY): Payer: Medicare Other

## 2021-04-27 VITALS — BP 130/82 | HR 62 | Temp 96.9°F | Resp 18

## 2021-04-27 DIAGNOSIS — Z5111 Encounter for antineoplastic chemotherapy: Secondary | ICD-10-CM | POA: Diagnosis not present

## 2021-04-27 DIAGNOSIS — Z95828 Presence of other vascular implants and grafts: Secondary | ICD-10-CM

## 2021-04-27 DIAGNOSIS — D46Z Other myelodysplastic syndromes: Secondary | ICD-10-CM

## 2021-04-27 DIAGNOSIS — C9200 Acute myeloblastic leukemia, not having achieved remission: Secondary | ICD-10-CM

## 2021-04-27 MED ORDER — SODIUM CHLORIDE 0.9 % IV SOLN: Freq: Once | INTRAVENOUS | Status: AC

## 2021-04-27 MED ORDER — HEPARIN SOD (PORK) LOCK FLUSH 100 UNIT/ML IV SOLN
500.0000 [IU] | Freq: Once | INTRAVENOUS | Status: AC | PRN
  Administered 2021-04-27: 12:00:00 500 [IU]

## 2021-04-27 MED ORDER — ONDANSETRON HCL 4 MG/2ML IJ SOLN
4.0000 mg | Freq: Once | INTRAMUSCULAR | Status: AC
  Administered 2021-04-27: 09:00:00 4 mg via INTRAVENOUS
  Filled 2021-04-27: qty 2

## 2021-04-27 MED ORDER — SODIUM CHLORIDE 0.9 % IV SOLN
15.0000 mg/m2 | Freq: Once | INTRAVENOUS | Status: AC
  Administered 2021-04-27: 10:00:00 35 mg via INTRAVENOUS
  Filled 2021-04-27: qty 7

## 2021-04-27 MED ORDER — SODIUM CHLORIDE 0.9% FLUSH
10.0000 mL | INTRAVENOUS | Status: DC | PRN
  Administered 2021-04-27: 12:00:00 10 mL

## 2021-04-27 NOTE — Progress Notes (Signed)
Patient presents today for chemotherapy treatment.  Patient is in satisfactory condition with no new complaints voiced.  Vital signs are stable.  No changes since yesterday's treatment.  We will proceed with treatment per MD orders.   Patient tolerated treatment well with no complaints voiced.  Patient left via motorized wheelchair in stable condition.  Vital signs stable at discharge.  Follow up as scheduled.

## 2021-04-27 NOTE — Patient Instructions (Signed)
[facility]  Discharge Instructions: Thank you for choosing [facility] to provide your oncology and hematology care.  If you have a lab appointment with the Cancer Center, please come in thru the Main Entrance and check in at the main information desk.  Wear comfortable clothing and clothing appropriate for easy access to any Portacath or PICC line.   We strive to give you quality time with your provider. You may need to reschedule your appointment if you arrive late (15 or more minutes).  Arriving late affects you and other patients whose appointments are after yours.  Also, if you miss three or more appointments without notifying the office, you may be dismissed from the clinic at the provider's discretion.      For prescription refill requests, have your pharmacy contact our office and allow 72 hours for refills to be completed.        To help prevent nausea and vomiting after your treatment, we encourage you to take your nausea medication as directed.  BELOW ARE SYMPTOMS THAT SHOULD BE REPORTED IMMEDIATELY: *FEVER GREATER THAN 100.4 F (38 C) OR HIGHER *CHILLS OR SWEATING *NAUSEA AND VOMITING THAT IS NOT CONTROLLED WITH YOUR NAUSEA MEDICATION *UNUSUAL SHORTNESS OF BREATH *UNUSUAL BRUISING OR BLEEDING *URINARY PROBLEMS (pain or burning when urinating, or frequent urination) *BOWEL PROBLEMS (unusual diarrhea, constipation, pain near the anus) TENDERNESS IN MOUTH AND THROAT WITH OR WITHOUT PRESENCE OF ULCERS (sore throat, sores in mouth, or a toothache) UNUSUAL RASH, SWELLING OR PAIN  UNUSUAL VAGINAL DISCHARGE OR ITCHING   Items with * indicate a potential emergency and should be followed up as soon as possible or go to the Emergency Department if any problems should occur.  Please show the CHEMOTHERAPY ALERT CARD or IMMUNOTHERAPY ALERT CARD at check-in to the Emergency Department and triage nurse.  Should you have questions after your visit or need to cancel  or reschedule your appointment, please contact [facility] [phone]  and follow the prompts.  Office hours are 8:00 a.m. to 4:30 p.m. Monday - Friday. Please note that voicemails left after 4:00 p.m. may not be returned until the following business day.  We are closed weekends and major holidays. You have access to a nurse at all times for urgent questions. Please call the main number to the clinic [phone] and follow the prompts.  For any non-urgent questions, you may also contact your provider using MyChart. We now offer e-Visits for anyone 18 and older to request care online for non-urgent symptoms. For details visit [email or web address].   Also download the MyChart app! Go to the app store, search "MyChart", open the app, select [facility], and log in with your MyChart username and password.  Due to Covid, a mask is required upon entering the hospital/clinic. If you do not have a mask, one will be given to you upon arrival. For doctor visits, patients may have 1 support person aged 18 or older with them. For treatment visits, patients cannot have anyone with them due to current Covid guidelines and our immunocompromised population.  

## 2021-04-28 ENCOUNTER — Inpatient Hospital Stay (HOSPITAL_COMMUNITY): Payer: Medicare Other

## 2021-04-28 VITALS — BP 138/76 | HR 71 | Temp 97.3°F | Resp 20

## 2021-04-28 DIAGNOSIS — Z95828 Presence of other vascular implants and grafts: Secondary | ICD-10-CM

## 2021-04-28 DIAGNOSIS — D46Z Other myelodysplastic syndromes: Secondary | ICD-10-CM

## 2021-04-28 DIAGNOSIS — C9200 Acute myeloblastic leukemia, not having achieved remission: Secondary | ICD-10-CM

## 2021-04-28 DIAGNOSIS — Z5111 Encounter for antineoplastic chemotherapy: Secondary | ICD-10-CM | POA: Diagnosis not present

## 2021-04-28 MED ORDER — SODIUM CHLORIDE 0.9 % IV SOLN: Freq: Once | INTRAVENOUS | Status: AC

## 2021-04-28 MED ORDER — ONDANSETRON HCL 4 MG/2ML IJ SOLN
4.0000 mg | Freq: Once | INTRAMUSCULAR | Status: AC
  Administered 2021-04-28: 09:00:00 4 mg via INTRAVENOUS
  Filled 2021-04-28: qty 2

## 2021-04-28 MED ORDER — SODIUM CHLORIDE 0.9% FLUSH
10.0000 mL | INTRAVENOUS | Status: DC | PRN
  Administered 2021-04-28: 11:00:00 10 mL

## 2021-04-28 MED ORDER — SODIUM CHLORIDE 0.9 % IV SOLN
15.0000 mg/m2 | Freq: Once | INTRAVENOUS | Status: AC
  Administered 2021-04-28: 10:00:00 35 mg via INTRAVENOUS
  Filled 2021-04-28: qty 7

## 2021-04-28 MED ORDER — HEPARIN SOD (PORK) LOCK FLUSH 100 UNIT/ML IV SOLN
500.0000 [IU] | Freq: Once | INTRAVENOUS | Status: AC | PRN
  Administered 2021-04-28: 11:00:00 500 [IU]

## 2021-04-28 NOTE — Patient Instructions (Signed)
[facility]  Discharge Instructions: Thank you for choosing [facility] to provide your oncology and hematology care.  If you have a lab appointment with the Cancer Center, please come in thru the Main Entrance and check in at the main information desk.  Wear comfortable clothing and clothing appropriate for easy access to any Portacath or PICC line.   We strive to give you quality time with your provider. You may need to reschedule your appointment if you arrive late (15 or more minutes).  Arriving late affects you and other patients whose appointments are after yours.  Also, if you miss three or more appointments without notifying the office, you may be dismissed from the clinic at the provider's discretion.      For prescription refill requests, have your pharmacy contact our office and allow 72 hours for refills to be completed.        To help prevent nausea and vomiting after your treatment, we encourage you to take your nausea medication as directed.  BELOW ARE SYMPTOMS THAT SHOULD BE REPORTED IMMEDIATELY: *FEVER GREATER THAN 100.4 F (38 C) OR HIGHER *CHILLS OR SWEATING *NAUSEA AND VOMITING THAT IS NOT CONTROLLED WITH YOUR NAUSEA MEDICATION *UNUSUAL SHORTNESS OF BREATH *UNUSUAL BRUISING OR BLEEDING *URINARY PROBLEMS (pain or burning when urinating, or frequent urination) *BOWEL PROBLEMS (unusual diarrhea, constipation, pain near the anus) TENDERNESS IN MOUTH AND THROAT WITH OR WITHOUT PRESENCE OF ULCERS (sore throat, sores in mouth, or a toothache) UNUSUAL RASH, SWELLING OR PAIN  UNUSUAL VAGINAL DISCHARGE OR ITCHING   Items with * indicate a potential emergency and should be followed up as soon as possible or go to the Emergency Department if any problems should occur.  Please show the CHEMOTHERAPY ALERT CARD or IMMUNOTHERAPY ALERT CARD at check-in to the Emergency Department and triage nurse.  Should you have questions after your visit or need to cancel  or reschedule your appointment, please contact  CANCER CENTER [phone]  and follow the prompts.  Office hours are 8:00 a.m. to 4:30 p.m. Monday - Friday. Please note that voicemails left after 4:00 p.m. may not be returned until the following business day.  We are closed weekends and major holidays. You have access to a nurse at all times for urgent questions. Please call the main number to the clinic [phone] and follow the prompts.  For any non-urgent questions, you may also contact your provider using MyChart. We now offer e-Visits for anyone 18 and older to request care online for non-urgent symptoms. For details visit [email or web address].   Also download the MyChart app! Go to the app store, search "MyChart", open the app, select [facility], and log in with your MyChart username and password.  Due to Covid, a mask is required upon entering the hospital/clinic. If you do not have a mask, one will be given to you upon arrival. For doctor visits, patients may have 1 support person aged 18 or older with them. For treatment visits, patients cannot have anyone with them due to current Covid guidelines and our immunocompromised population.  

## 2021-04-28 NOTE — Progress Notes (Signed)
Treatment given per orders. Patient tolerated it well without problems. Vitals stable and discharged home from clinic ambulatory. Follow up as scheduled.  

## 2021-04-29 ENCOUNTER — Inpatient Hospital Stay (HOSPITAL_COMMUNITY): Payer: Medicare Other

## 2021-04-29 VITALS — BP 143/84 | HR 81 | Temp 98.4°F | Resp 18

## 2021-04-29 DIAGNOSIS — C9200 Acute myeloblastic leukemia, not having achieved remission: Secondary | ICD-10-CM

## 2021-04-29 DIAGNOSIS — Z5111 Encounter for antineoplastic chemotherapy: Secondary | ICD-10-CM | POA: Diagnosis not present

## 2021-04-29 DIAGNOSIS — D46Z Other myelodysplastic syndromes: Secondary | ICD-10-CM

## 2021-04-29 DIAGNOSIS — Z95828 Presence of other vascular implants and grafts: Secondary | ICD-10-CM

## 2021-04-29 MED ORDER — SODIUM CHLORIDE 0.9 % IV SOLN
15.0000 mg/m2 | Freq: Once | INTRAVENOUS | Status: AC
  Administered 2021-04-29: 09:00:00 35 mg via INTRAVENOUS
  Filled 2021-04-29: qty 7

## 2021-04-29 MED ORDER — HEPARIN SOD (PORK) LOCK FLUSH 100 UNIT/ML IV SOLN
500.0000 [IU] | Freq: Once | INTRAVENOUS | Status: AC | PRN
  Administered 2021-04-29: 11:00:00 500 [IU]

## 2021-04-29 MED ORDER — SODIUM CHLORIDE 0.9% FLUSH
10.0000 mL | INTRAVENOUS | Status: DC | PRN
  Administered 2021-04-29: 11:00:00 10 mL

## 2021-04-29 MED ORDER — SODIUM CHLORIDE 0.9 % IV SOLN: Freq: Once | INTRAVENOUS | Status: AC

## 2021-04-29 MED ORDER — ONDANSETRON HCL 4 MG/2ML IJ SOLN
4.0000 mg | Freq: Once | INTRAMUSCULAR | Status: AC
  Administered 2021-04-29: 08:00:00 4 mg via INTRAVENOUS
  Filled 2021-04-29: qty 2

## 2021-04-29 NOTE — Progress Notes (Signed)
Patient presents today for Decitabine infusion per providers order.  Vital signs within parameters for treatment.  No new complaints at this time.  Decitabine infusion given today per MD orders.  Stable during infusion without adverse affects.  Vital signs stable.  No complaints at this time.  Discharge from clinic via wheelchair in stable condition.  Alert and oriented X 3.  Follow up with [facility] as scheduled.

## 2021-04-29 NOTE — Patient Instructions (Signed)
[facility]  Discharge Instructions: Thank you for choosing [facility] to provide your oncology and hematology care.  If you have a lab appointment with the [facility], please come in thru the Main Entrance and check in at the main information desk.  Wear comfortable clothing and clothing appropriate for easy access to any Portacath or PICC line.   We strive to give you quality time with your provider. You may need to reschedule your appointment if you arrive late (15 or more minutes).  Arriving late affects you and other patients whose appointments are after yours.  Also, if you miss three or more appointments without notifying the office, you may be dismissed from the clinic at the providers discretion.      For prescription refill requests, have your pharmacy contact our office and allow 72 hours for refills to be completed.    Today you received the following chemotherapy and/or immunotherapy agents Decitabine      To help prevent nausea and vomiting after your treatment, we encourage you to take your nausea medication as directed.  BELOW ARE SYMPTOMS THAT SHOULD BE REPORTED IMMEDIATELY: *FEVER GREATER THAN 100.4 F (38 C) OR HIGHER *CHILLS OR SWEATING *NAUSEA AND VOMITING THAT IS NOT CONTROLLED WITH YOUR NAUSEA MEDICATION *UNUSUAL SHORTNESS OF BREATH *UNUSUAL BRUISING OR BLEEDING *URINARY PROBLEMS (pain or burning when urinating, or frequent urination) *BOWEL PROBLEMS (unusual diarrhea, constipation, pain near the anus) TENDERNESS IN MOUTH AND THROAT WITH OR WITHOUT PRESENCE OF ULCERS (sore throat, sores in mouth, or a toothache) UNUSUAL RASH, SWELLING OR PAIN  UNUSUAL VAGINAL DISCHARGE OR ITCHING   Items with * indicate a potential emergency and should be followed up as soon as possible or go to the Emergency Department if any problems should occur.  Please show the CHEMOTHERAPY ALERT CARD or IMMUNOTHERAPY ALERT CARD at check-in to the Emergency  Department and triage nurse.  Should you have questions after your visit or need to cancel or reschedule your appointment, please contact [facility] [phone]  and follow the prompts.  Office hours are 8:00 a.m. to 4:30 p.m. Monday - Friday. Please note that voicemails left after 4:00 p.m. may not be returned until the following business day.  We are closed weekends and major holidays. You have access to a nurse at all times for urgent questions. Please call the main number to the clinic [phone] and follow the prompts.  For any non-urgent questions, you may also contact your provider using MyChart. We now offer e-Visits for anyone 78 and older to request care online for non-urgent symptoms. For details visit [email or web address].   Also download the MyChart app! Go to the app store, search "MyChart", open the app, select [facility], and log in with your MyChart username and password.  Due to Covid, a mask is required upon entering the hospital/clinic. If you do not have a mask, one will be given to you upon arrival. For doctor visits, patients may have 1 support person aged 28 or older with them. For treatment visits, patients cannot have anyone with them due to current Covid guidelines and our immunocompromised population.

## 2021-05-05 ENCOUNTER — Other Ambulatory Visit (HOSPITAL_COMMUNITY): Payer: Self-pay

## 2021-05-10 ENCOUNTER — Other Ambulatory Visit (HOSPITAL_COMMUNITY): Payer: Self-pay | Admitting: *Deleted

## 2021-05-10 MED ORDER — IPRATROPIUM BROMIDE 0.03 % NA SOLN: 2.0000 | Freq: Three times a day (TID) | NASAL | 3 refills | Status: DC

## 2021-05-30 ENCOUNTER — Other Ambulatory Visit (HOSPITAL_COMMUNITY): Payer: Self-pay

## 2021-06-02 ENCOUNTER — Other Ambulatory Visit (HOSPITAL_COMMUNITY): Payer: Self-pay

## 2021-06-02 MED ORDER — HYDROCODONE-ACETAMINOPHEN 10-325 MG PO TABS: 1.0000 | ORAL_TABLET | Freq: Four times a day (QID) | ORAL | 0 refills | Status: DC | PRN

## 2021-06-06 ENCOUNTER — Inpatient Hospital Stay (HOSPITAL_COMMUNITY): Payer: Medicare Other | Attending: Hematology | Admitting: Hematology

## 2021-06-06 ENCOUNTER — Other Ambulatory Visit: Payer: Self-pay

## 2021-06-06 ENCOUNTER — Inpatient Hospital Stay (HOSPITAL_COMMUNITY): Payer: Medicare Other

## 2021-06-06 VITALS — BP 124/76 | HR 73 | Temp 98.0°F | Resp 17 | Wt 222.2 lb

## 2021-06-06 VITALS — BP 144/77 | HR 72 | Temp 97.9°F | Resp 18

## 2021-06-06 DIAGNOSIS — D696 Thrombocytopenia, unspecified: Secondary | ICD-10-CM | POA: Diagnosis not present

## 2021-06-06 DIAGNOSIS — F0390 Unspecified dementia without behavioral disturbance: Secondary | ICD-10-CM | POA: Insufficient documentation

## 2021-06-06 DIAGNOSIS — Z79899 Other long term (current) drug therapy: Secondary | ICD-10-CM | POA: Diagnosis not present

## 2021-06-06 DIAGNOSIS — C9200 Acute myeloblastic leukemia, not having achieved remission: Secondary | ICD-10-CM | POA: Diagnosis present

## 2021-06-06 DIAGNOSIS — I129 Hypertensive chronic kidney disease with stage 1 through stage 4 chronic kidney disease, or unspecified chronic kidney disease: Secondary | ICD-10-CM | POA: Insufficient documentation

## 2021-06-06 DIAGNOSIS — N183 Chronic kidney disease, stage 3 unspecified: Secondary | ICD-10-CM | POA: Diagnosis not present

## 2021-06-06 DIAGNOSIS — D46Z Other myelodysplastic syndromes: Secondary | ICD-10-CM

## 2021-06-06 DIAGNOSIS — Z5112 Encounter for antineoplastic immunotherapy: Secondary | ICD-10-CM | POA: Insufficient documentation

## 2021-06-06 DIAGNOSIS — Z95828 Presence of other vascular implants and grafts: Secondary | ICD-10-CM

## 2021-06-06 LAB — CBC WITH DIFFERENTIAL/PLATELET
Abs Immature Granulocytes: 0.04 10*3/uL (ref 0.00–0.07)
Basophils Absolute: 0 10*3/uL (ref 0.0–0.1)
Basophils Relative: 1 %
Eosinophils Absolute: 0.1 10*3/uL (ref 0.0–0.5)
Eosinophils Relative: 3 %
HCT: 46 % (ref 39.0–52.0)
Hemoglobin: 15.2 g/dL (ref 13.0–17.0)
Immature Granulocytes: 1 %
Lymphocytes Relative: 26 %
Lymphs Abs: 1.1 10*3/uL (ref 0.7–4.0)
MCH: 31.9 pg (ref 26.0–34.0)
MCHC: 33 g/dL (ref 30.0–36.0)
MCV: 96.4 fL (ref 80.0–100.0)
Monocytes Absolute: 0.3 10*3/uL (ref 0.1–1.0)
Monocytes Relative: 8 %
Neutro Abs: 2.5 10*3/uL (ref 1.7–7.7)
Neutrophils Relative %: 61 %
Platelets: 134 10*3/uL — ABNORMAL LOW (ref 150–400)
RBC: 4.77 MIL/uL (ref 4.22–5.81)
RDW: 15.4 % (ref 11.5–15.5)
WBC: 4.1 10*3/uL (ref 4.0–10.5)
nRBC: 0 % (ref 0.0–0.2)

## 2021-06-06 LAB — COMPREHENSIVE METABOLIC PANEL
ALT: 26 U/L (ref 0–44)
AST: 23 U/L (ref 15–41)
Albumin: 3.8 g/dL (ref 3.5–5.0)
Alkaline Phosphatase: 69 U/L (ref 38–126)
Anion gap: 5 (ref 5–15)
BUN: 19 mg/dL (ref 8–23)
CO2: 27 mmol/L (ref 22–32)
Calcium: 9.3 mg/dL (ref 8.9–10.3)
Chloride: 106 mmol/L (ref 98–111)
Creatinine, Ser: 1.04 mg/dL (ref 0.61–1.24)
GFR, Estimated: >60 mL/min (ref >60)
Glucose, Bld: 105 mg/dL — ABNORMAL HIGH (ref 70–99)
Potassium: 4.8 mmol/L (ref 3.5–5.1)
Sodium: 138 mmol/L (ref 135–145)
Total Bilirubin: 0.3 mg/dL (ref 0.3–1.2)
Total Protein: 6.3 g/dL — ABNORMAL LOW (ref 6.5–8.1)

## 2021-06-06 LAB — MAGNESIUM: Magnesium: 1.9 mg/dL (ref 1.7–2.4)

## 2021-06-06 LAB — LACTATE DEHYDROGENASE: LDH: 151 U/L (ref 98–192)

## 2021-06-06 MED ORDER — HEPARIN SOD (PORK) LOCK FLUSH 100 UNIT/ML IV SOLN
500.0000 [IU] | Freq: Once | INTRAVENOUS | Status: AC | PRN
  Administered 2021-06-06: 500 [IU]

## 2021-06-06 MED ORDER — SODIUM CHLORIDE 0.9% FLUSH
10.0000 mL | INTRAVENOUS | Status: DC | PRN
  Administered 2021-06-06: 10 mL

## 2021-06-06 MED ORDER — SODIUM CHLORIDE 0.9 % IV SOLN: Freq: Once | INTRAVENOUS | Status: AC

## 2021-06-06 MED ORDER — SODIUM CHLORIDE 0.9 % IV SOLN
15.0000 mg/m2 | Freq: Once | INTRAVENOUS | Status: AC
  Administered 2021-06-06: 35 mg via INTRAVENOUS
  Filled 2021-06-06: qty 7

## 2021-06-06 MED ORDER — ONDANSETRON HCL 4 MG/2ML IJ SOLN
4.0000 mg | Freq: Once | INTRAMUSCULAR | Status: AC
  Administered 2021-06-06: 4 mg via INTRAVENOUS
  Filled 2021-06-06: qty 2

## 2021-06-06 NOTE — Progress Notes (Signed)
Patient has been examined by Dr. Katragadda, and vital signs and labs have been reviewed. ANC, Creatinine, LFTs, hemoglobin, and platelets are within treatment parameters per M.D. - pt may proceed with treatment.    °

## 2021-06-06 NOTE — Progress Notes (Signed)
Pt presents today for Decitabine per provider's order. Vital signs and labs WNL for treatment. Okay to proceed with treatment per Dr.K.Pt voiced no new complaints at this time.  Decitabine given today per MD orders. Tolerated infusion without adverse affects. Vital signs stable. No complaints at this time. Discharged from clinic via motorized chair in stable condition. Alert and oriented x 3. F/U with [facility] as scheduled.

## 2021-06-06 NOTE — Patient Instructions (Signed)
[facility]  Discharge Instructions: Thank you for choosing [facility] to provide your oncology and hematology care.  If you have a lab appointment with the [facility], please come in thru the Main Entrance and check in at the main information desk.  Wear comfortable clothing and clothing appropriate for easy access to any Portacath or PICC line.   We strive to give you quality time with your provider. You may need to reschedule your appointment if you arrive late (15 or more minutes).  Arriving late affects you and other patients whose appointments are after yours.  Also, if you miss three or more appointments without notifying the office, you may be dismissed from the clinic at the providers discretion.      For prescription refill requests, have your pharmacy contact our office and allow 72 hours for refills to be completed.    Today you received the following chemotherapy and/or immunotherapy agents Decitabine   To help prevent nausea and vomiting after your treatment, we encourage you to take your nausea medication as directed.  BELOW ARE SYMPTOMS THAT SHOULD BE REPORTED IMMEDIATELY: *FEVER GREATER THAN 100.4 F (38 C) OR HIGHER *CHILLS OR SWEATING *NAUSEA AND VOMITING THAT IS NOT CONTROLLED WITH YOUR NAUSEA MEDICATION *UNUSUAL SHORTNESS OF BREATH *UNUSUAL BRUISING OR BLEEDING *URINARY PROBLEMS (pain or burning when urinating, or frequent urination) *BOWEL PROBLEMS (unusual diarrhea, constipation, pain near the anus) TENDERNESS IN MOUTH AND THROAT WITH OR WITHOUT PRESENCE OF ULCERS (sore throat, sores in mouth, or a toothache) UNUSUAL RASH, SWELLING OR PAIN  UNUSUAL VAGINAL DISCHARGE OR ITCHING   Items with * indicate a potential emergency and should be followed up as soon as possible or go to the Emergency Department if any problems should occur.  Please show the CHEMOTHERAPY ALERT CARD or IMMUNOTHERAPY ALERT CARD at check-in to the Emergency  Department and triage nurse.  Should you have questions after your visit or need to cancel or reschedule your appointment, please contact [facility] [phone]  and follow the prompts.  Office hours are 8:00 a.m. to 4:30 p.m. Monday - Friday. Please note that voicemails left after 4:00 p.m. may not be returned until the following business day.  We are closed weekends and major holidays. You have access to a nurse at all times for urgent questions. Please call the main number to the clinic [phone] and follow the prompts.  For any non-urgent questions, you may also contact your provider using MyChart. We now offer e-Visits for anyone 77 and older to request care online for non-urgent symptoms. For details visit [email or web address].   Also download the MyChart app! Go to the app store, search "MyChart", open the app, select [facility], and log in with your MyChart username and password.  Due to Covid, a mask is required upon entering the hospital/clinic. If you do not have a mask, one will be given to you upon arrival. For doctor visits, patients may have 1 support person aged 3 or older with them. For treatment visits, patients cannot have anyone with them due to current Covid guidelines and our immunocompromised population.

## 2021-06-06 NOTE — Progress Notes (Signed)
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7 % of original dose 75 mg/m2), Intravenous, Once, 4 of 6 cycles Dose modification: 50 mg/m2 (original dose 75 mg/m2, Cycle 1, Reason: Provider Judgment), 50 mg/m2 (original dose 75 mg/m2, Cycle 2, Reason: Provider Judgment) Administration: 100 mg (08/22/2018), 100 mg (08/23/2018), 100 mg (08/26/2018), 100 mg (08/27/2018), 100 mg (08/28/2018), 100 mg (08/29/2018), 100 mg (08/30/2018), 100 mg (10/01/2018), 100 mg (10/02/2018), 100 mg (11/04/2018), 100 mg (11/05/2018), 100 mg (09/23/2018), 100 mg (09/24/2018), 100 mg (09/25/2018), 100 mg (09/26/2018), 100 mg (09/27/2018), 100 mg (10/28/2018), 100 mg (10/29/2018), 100 mg (10/30/2018), 100 mg (10/31/2018), 100 mg (11/01/2018), 100 mg (12/02/2018), 100 mg (12/03/2018), 100 mg (12/04/2018), 100 mg (12/05/2018), 100 mg (12/06/2018)   for chemotherapy treatment.     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  INTERVAL HISTORY:  [name], a 76 y.o. male, returns for routine follow-up and consideration for next cycle of chemotherapy. Shell was last seen on 04/25/2021.  Due for cycle #21 of Decitabine today.   Overall, he tells me he has been feeling pretty well. He denies infections or antibiotic use. He is taking venetoclax and tolerating it well. His energy is stable, and he denies n/v/d.   Overall, he feels ready for next cycle of chemo today.    REVIEW OF SYSTEMS:  Review of Systems  Constitutional:  Negative for appetite change and fatigue.  Gastrointestinal:  Positive for constipation. Negative for diarrhea, nausea and vomiting.  Musculoskeletal:  Positive for arthralgias (4/10).  All other systems reviewed and are negative.  PAST MEDICAL/SURGICAL HISTORY:  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   Cancer ([place]) 01-28-2014   skin cancer   CKD (chronic kidney disease), stage III (HCC)    GERD (gastroesophageal reflux disease)    Heart murmur    NOTED DURING PHYSICAL WHEN HE WAS ENLISTING IN [facility] , DIDNT KNOW UNTIL THAT TIME AND REPORTS , "THATS THE LAST I HEARD ABOUT IT "    History  of hypertension    no longer issue   History of kidney stones    History of malignant melanoma of skin    excision top of scalp 2015-- no recurrence   History of urinary retention    post op lumbar fusion surgery 04/ 2016   Hypertension    Kidney dysfunction    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, MONITORED BY [facility] DR Richard Main  Wallace    Left ureteral calculus    Seasonal allergies    Wears glasses    Wears glasses    Wears partial dentures    upper and lower   Past Surgical History:  Procedure Laterality Date   ANKLE FUSION Right 2007   CARPAL TUNNEL RELEASE Left 12/28/2009   w/ pulley release left long finger   CARPAL TUNNEL RELEASE Right 07/22/2013   Procedure: RIGHT CARPAL TUNNEL RELEASE;  Surgeon: Richard Wallace., MD;  Location: [facility];  Service: Orthopedics;  Laterality: Right;   COLONOSCOPY     CYSTO/  LEFT RETROGRADE PYELOGRAM  11/21/2010   CYSTOSCOPY WITH STENT PLACEMENT Left 03/09/2016   Procedure: CYSTOSCOPY WITH STENT PLACEMENT;  Surgeon: Richard Gallo, MD;  Location: [facility];  Service: Urology;  Laterality: Left;   CYSTOSCOPY/RETROGRADE/URETEROSCOPY/STONE EXTRACTION WITH BASKET Left 03/09/2016   Procedure: CYSTOSCOPY/RETROGRADE/URETEROSCOPY/STONE EXTRACTION WITH BASKET;  Surgeon: Richard Gallo, MD;  Location: [facility];  Service: Urology;  Laterality: Left;   LEFT URETEROSCOPIC LASER LITHOTRIPSY STONE EXTRACTION/ STENT PLACEMENT  05/23/2010   MOHS SURGERY     TOP OF THE HEAD    ORIF ANKLE FRACTURE Right 1978   PORT-A-CATH REMOVAL Right 02/14/2019   Procedure: MINOR REMOVAL PORT-A-CATH;  Surgeon: Richard Signs, MD;  Location: [facility];  Service: General;  Laterality: Right;   PORTACATH PLACEMENT Right 08/19/2018   Procedure: INSERTION PORT-A-CATH (attached catheter in right subclavian);  Surgeon: Richard Signs, MD;  Location: [facility];  Service: General;  Laterality: Right;   PORTACATH PLACEMENT Left 05/16/2019   Procedure: INSERTION PORT-A-CATH (attached catheter in left subclavian);  Surgeon: Richard Signs, MD;  Location: [facility];  Service: General;  Laterality: Left;   POSTERIOR LUMBAR FUSION  08/21/2014   laminectomy and decompression L2 -- L5   RIGHT LOWER LEG SURGERY  X3  1975 to 1976   including ORIF   TONSILLECTOMY AND  ADENOIDECTOMY  7858   UMBILICAL HERNIA REPAIR  2009 approx    SOCIAL HISTORY:  Social History   Socioeconomic History   Marital status: Married    Spouse name: Not on file   Number of children: Not on file   Years of education: Not on file   Highest education level: Not on file  Occupational History   Not on file  Tobacco Use   Smoking status: Former    Years: 20.00    Types: Cigarettes    Quit date: 07/17/1986    Years since quitting: 34.9   Smokeless tobacco: Never  Vaping Use   Vaping Use: Never used  Substance and Sexual Activity   Alcohol use: Yes    Alcohol/week: 7.0 - 14.0 standard drinks    Types: 7 - 14 Cans of beer per week    Comment: 1 -2 beer daily   Drug use: No   Sexual activity: Not Currently  Other Topics Concern   Not on file  Social History Narrative   Not on file   Social Determinants of Health   Financial Resource Strain: Not on file  Food  Insecurity: Not on file  Transportation Needs: Not on file  Physical Activity: Not on file  Stress: Not on file  Social Connections: Not on file  Intimate Partner Violence: Not on file    FAMILY HISTORY:  Family History  Problem Relation Age of Onset   Stroke Mother    Prostate cancer Father    Bone cancer Father    Diverticulitis Father    Rheum arthritis Sister    Urinary tract infection Sister    Colon cancer Neg Hx     CURRENT MEDICATIONS:  Current Outpatient Medications  Medication Sig Dispense Refill   allopurinol (ZYLOPRIM) 300 MG tablet Take 1 tablet (300 mg total) by mouth at bedtime. 90 tablet 3   ALPRAZolam (XANAX) 0.25 MG tablet Take 1 tablet by mouth twice daily as needed for anxiety 60 tablet 0   amLODipine (NORVASC) 2.5 MG tablet Take 1 tablet (2.5 mg total) by mouth daily. 90 tablet 2   cetirizine (ZYRTEC) 10 MG tablet Take 10 mg by mouth daily. IN THE MORNING     diclofenac Sodium (VOLTAREN) 1 % GEL Apply three times daily to knees as needed for pain 50 g 3   docusate sodium  (COLACE) 100 MG capsule Take 100 mg by mouth at bedtime.      donepezil (ARICEPT) 10 MG tablet Take 1 tablet (10 mg total) by mouth at bedtime. 30 tablet 3   HYDROcodone-acetaminophen (NORCO) 10-325 MG tablet Take 1 tablet by mouth every 6 (six) hours as needed. 112 tablet 0   ipratropium (ATROVENT) 0.03 % nasal spray Place 2 sprays into both nostrils 3 (three) times daily. 30 mL 3   lansoprazole (PREVACID) 15 MG capsule Take 15 mg by mouth daily.      lidocaine (LIDODERM) 5 % Place 1 patch onto the skin daily. Remove & Discard patch within 12 hours or as directed by MD 30 patch 0   lidocaine-prilocaine (EMLA) cream Apply 1 application topically See admin instructions. ONE HOUR PRIOR TO CHEMOTHERAPY APPOINTMENT 30 g 6   magnesium oxide (MAG-OX) 400 (240 Mg) MG tablet TAKE 1 TABLET BY MOUTH THREE TIMES DAILY 90 tablet 6   magnesium oxide (MAG-OX) 400 (241.3 Mg) MG tablet Take 1 tablet (400 mg total) by mouth 3 (three) times daily. (Patient taking differently: Take 1 tablet by mouth 4 (four) times daily.) 90 tablet 3   SF 5000 PLUS 1.1 % CREA dental cream SMARTSIG:Sparingly By Mouth Every Night     tamsulosin (FLOMAX) 0.4 MG CAPS capsule Take 1 capsule (0.4 mg total) by mouth every evening. 30 capsule 6   venetoclax (VENCLEXTA) 100 MG tablet TAKE 2 TABLETS (200 MG) BY MOUTH DAILY 60 tablet 2   No current facility-administered medications for this visit.   Facility-Administered Medications Ordered in Other Visits  Medication Dose Route Frequency Provider Last Rate Last Admin   sodium chloride flush (NS) 0.9 % injection 10 mL  10 mL Intracatheter PRN [name], MD   10 mL at 11/21/19 1030   sodium chloride flush (NS) 0.9 % injection 20 mL  20 mL Intravenous PRN [name], MD   20 mL at 07/21/19 1053    ALLERGIES:  No Known Allergies  PHYSICAL EXAM:  Performance status (ECOG): 1 - Symptomatic but completely ambulatory  Vitals:   06/06/21 1025  BP: 124/76  Pulse: 73   Resp: 17  Temp: 98 F (36.7 C)  SpO2: 95%   Wt Readings from Last 3 Encounters:  06/06/21 222 lb  3.2 oz (100.8 kg)  04/25/21 218 lb 11.1 oz (99.2 kg)  03/07/21 217 lb 1.6 oz (98.5 kg)   Physical Exam Vitals reviewed.  Constitutional:      Appearance: Normal appearance. He is obese.     Comments: In wheelchair  Cardiovascular:     Rate and Rhythm: Normal rate and regular rhythm.     Pulses: Normal pulses.     Heart sounds: Normal heart sounds.  Pulmonary:     Effort: Pulmonary effort is normal.     Breath sounds: Normal breath sounds.  Neurological:     General: No focal deficit present.     Mental Status: He is alert and oriented to person, place, and time.  Psychiatric:        Mood and Affect: Mood normal.        Behavior: Behavior normal.    LABORATORY DATA:  I have reviewed the labs as listed.  CBC Latest Ref Rng & Units 06/06/2021 04/25/2021 03/07/2021  WBC 4.0 - 10.5 K/uL 4.1 5.6 4.7  Hemoglobin 13.0 - 17.0 g/dL 15.2 15.3 15.3  Hematocrit 39.0 - 52.0 % 46.0 47.2 46.9  Platelets 150 - 400 K/uL 134(L) 120(L) 136(L)   CMP Latest Ref Rng & Units 06/06/2021 04/25/2021 03/07/2021  Glucose 70 - 99 mg/dL 105(H) 112(H) 93  BUN 8 - 23 mg/dL 19 25(H) 21  Creatinine 0.61 - 1.24 mg/dL 1.04 1.04 1.03  Sodium 135 - 145 mmol/L 138 138 140  Potassium 3.5 - 5.1 mmol/L 4.8 4.3 4.5  Chloride 98 - 111 mmol/L 106 105 105  CO2 22 - 32 mmol/L 27 26 28   Calcium 8.9 - 10.3 mg/dL 9.3 9.2 9.3  Total Protein 6.5 - 8.1 g/dL 6.3(L) 6.4(L) 6.5  Total Bilirubin 0.3 - 1.2 mg/dL 0.3 0.4 0.6  Alkaline Phos 38 - 126 U/L 69 65 73  AST 15 - 41 U/L 23 22 26   ALT 0 - 44 U/L 26 25 28     DIAGNOSTIC IMAGING:  I have independently reviewed the scans and discussed with the patient. No results found.   ASSESSMENT:  1.  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  -BMBX on 02/25/2019 after cycle 1 did not show  any evidence of leukemia. -CT CAP on 02/12/2019 shows numerous bilateral irregular/spiculated pulmonary nodules measuring up to 14 mm, nonspecific.  Hepatic steatosis.  Spleen is normal. -I have talked to Dr.Ravandi at [facility] per patient request.  There are clinical trials available upon progression of his AML.  There are also some clinical trials available now based on MRD positivity.  Patient not interested in moving to [place] at this time.   PLAN:  1.  AML: - He did not report any infections or hospitalizations in the past 6 weeks. - Reviewed labs today which showed white count is 4.1.  Mild thrombocytopenia with platelets 134.  Hemoglobin is normal.  LFTs and renal function is normal.  He does not report any GI side effects from venetoclax. - Proceed with next cycle of decitabine for 5 days. - He was instructed to start venetoclax 200 mg daily and take it for 2 weeks. - RTC 6 weeks for follow-up with repeat labs and treatment.   2.  Arthralgias: - Continue hydrocodone 10/325 every 6 hours as needed.   3.  Hypomagnesemia: - Continue magnesium 3 times daily.  Magnesium is 1.9.   4.  Hypertension: - Continue  Norvasc 2.5 mg daily.  Blood pressure is 124/76.   5.  Dementia: - Continue Aricept 10 mg daily.   Orders placed this encounter:  No orders of the defined types were placed in this encounter.    [name], MD [place] [phone]   I, Thana Ates, am acting as a scribe for Dr. [name].  I, [name] MD, have reviewed the above documentation for accuracy and completeness, and I agree with the above.

## 2021-06-06 NOTE — Patient Instructions (Signed)
[name] at [facility] Discharge Instructions   You were seen and examined today by Dr. Delton Coombes.  He reviewed your lab work which was normal/stable.  We will proceed with your treatment today and the rest of the week.  Return as scheduled in 6 weeks for lab work, office visit, and treatment.    Thank you for choosing [place] at [facility] to provide your oncology and hematology care.  To afford each patient quality time with our provider, please arrive at least 15 minutes before your scheduled appointment time.   If you have a lab appointment with the [facility] please come in thru the Main Entrance and check in at the main information desk.  You need to re-schedule your appointment should you arrive 10 or more minutes late.  We strive to give you quality time with our providers, and arriving late affects you and other patients whose appointments are after yours.  Also, if you no show three or more times for appointments you may be dismissed from the clinic at the providers discretion.     Again, thank you for choosing [facility].  Our hope is that these requests will decrease the amount of time that you wait before being seen by our physicians.       _____________________________________________________________  Should you have questions after your visit to [facility], please contact our office at [phone] and follow the prompts.  Our office hours are 8:00 a.m. and 4:30 p.m. Monday - Friday.  Please note that voicemails left after 4:00 p.m. may not be returned until the following business day.  We are closed weekends and major holidays.  You do have access to a nurse 24-7, just call the main number to the clinic [phone] and do not press any options, hold on the line and a nurse will answer the phone.    For prescription refill requests, have your pharmacy contact our office and allow 72 hours.     Due to Covid, you will need to wear a mask upon entering the hospital. If you do not have a mask, a mask will be given to you at the Main Entrance upon arrival. For doctor visits, patients may have 1 support person age 77 or older with them. For treatment visits, patients can not have anyone with them due to social distancing guidelines and our immunocompromised population.

## 2021-06-07 ENCOUNTER — Inpatient Hospital Stay (HOSPITAL_COMMUNITY): Payer: Medicare Other

## 2021-06-07 VITALS — BP 121/69 | HR 66 | Temp 97.2°F | Resp 18 | Ht 69.5 in

## 2021-06-07 DIAGNOSIS — Z5112 Encounter for antineoplastic immunotherapy: Secondary | ICD-10-CM | POA: Diagnosis not present

## 2021-06-07 DIAGNOSIS — D46Z Other myelodysplastic syndromes: Secondary | ICD-10-CM

## 2021-06-07 DIAGNOSIS — C9200 Acute myeloblastic leukemia, not having achieved remission: Secondary | ICD-10-CM

## 2021-06-07 DIAGNOSIS — Z95828 Presence of other vascular implants and grafts: Secondary | ICD-10-CM

## 2021-06-07 MED ORDER — HEPARIN SOD (PORK) LOCK FLUSH 100 UNIT/ML IV SOLN
500.0000 [IU] | Freq: Once | INTRAVENOUS | Status: AC | PRN
  Administered 2021-06-07: 500 [IU]

## 2021-06-07 MED ORDER — SODIUM CHLORIDE 0.9 % IV SOLN
15.0000 mg/m2 | Freq: Once | INTRAVENOUS | Status: AC
  Administered 2021-06-07: 35 mg via INTRAVENOUS
  Filled 2021-06-07: qty 7

## 2021-06-07 MED ORDER — ONDANSETRON HCL 4 MG/2ML IJ SOLN
4.0000 mg | Freq: Once | INTRAMUSCULAR | Status: AC
  Administered 2021-06-07: 4 mg via INTRAVENOUS
  Filled 2021-06-07: qty 2

## 2021-06-07 MED ORDER — SODIUM CHLORIDE 0.9 % IV SOLN: Freq: Once | INTRAVENOUS | Status: AC

## 2021-06-07 MED ORDER — SODIUM CHLORIDE 0.9% FLUSH
10.0000 mL | INTRAVENOUS | Status: DC | PRN
  Administered 2021-06-07: 10 mL

## 2021-06-07 NOTE — Progress Notes (Signed)
Patient presents today for chemotherapy infusion.  Patient is in satisfactory condition with no new complaints voiced.  Vital signs are stable.  We will proceed with treatment per MD orders.  Patient tolerated treatment well with no complaints voiced.  Patient left ambulatory in stable condition.  Vital signs stable at discharge.  Follow up as scheduled.    

## 2021-06-07 NOTE — Patient Instructions (Signed)
[facility]  Discharge Instructions: Thank you for choosing [facility] to provide your oncology and hematology care.  If you have a lab appointment with the [facility], please come in thru the Main Entrance and check in at the main information desk.  Wear comfortable clothing and clothing appropriate for easy access to any Portacath or PICC line.   We strive to give you quality time with your provider. You may need to reschedule your appointment if you arrive late (15 or more minutes).  Arriving late affects you and other patients whose appointments are after yours.  Also, if you miss three or more appointments without notifying the office, you may be dismissed from the clinic at the providers discretion.      For prescription refill requests, have your pharmacy contact our office and allow 72 hours for refills to be completed.    Today you received the following chemotherapy and/or immunotherapy agents: Decitabine      To help prevent nausea and vomiting after your treatment, we encourage you to take your nausea medication as directed.  BELOW ARE SYMPTOMS THAT SHOULD BE REPORTED IMMEDIATELY: *FEVER GREATER THAN 100.4 F (38 C) OR HIGHER *CHILLS OR SWEATING *NAUSEA AND VOMITING THAT IS NOT CONTROLLED WITH YOUR NAUSEA MEDICATION *UNUSUAL SHORTNESS OF BREATH *UNUSUAL BRUISING OR BLEEDING *URINARY PROBLEMS (pain or burning when urinating, or frequent urination) *BOWEL PROBLEMS (unusual diarrhea, constipation, pain near the anus) TENDERNESS IN MOUTH AND THROAT WITH OR WITHOUT PRESENCE OF ULCERS (sore throat, sores in mouth, or a toothache) UNUSUAL RASH, SWELLING OR PAIN  UNUSUAL VAGINAL DISCHARGE OR ITCHING   Items with * indicate a potential emergency and should be followed up as soon as possible or go to the Emergency Department if any problems should occur.  Please show the CHEMOTHERAPY ALERT CARD or IMMUNOTHERAPY ALERT CARD at check-in to the Emergency  Department and triage nurse.  Should you have questions after your visit or need to cancel or reschedule your appointment, please contact [facility] [phone]  and follow the prompts.  Office hours are 8:00 a.m. to 4:30 p.m. Monday - Friday. Please note that voicemails left after 4:00 p.m. may not be returned until the following business day.  We are closed weekends and major holidays. You have access to a nurse at all times for urgent questions. Please call the main number to the clinic [phone] and follow the prompts.  For any non-urgent questions, you may also contact your provider using MyChart. We now offer e-Visits for anyone 71 and older to request care online for non-urgent symptoms. For details visit [email or web address].   Also download the MyChart app! Go to the app store, search "MyChart", open the app, select [facility], and log in with your MyChart username and password.  Due to Covid, a mask is required upon entering the hospital/clinic. If you do not have a mask, one will be given to you upon arrival. For doctor visits, patients may have 1 support person aged 87 or older with them. For treatment visits, patients cannot have anyone with them due to current Covid guidelines and our immunocompromised population.

## 2021-06-08 ENCOUNTER — Encounter (HOSPITAL_COMMUNITY): Payer: Self-pay

## 2021-06-08 ENCOUNTER — Other Ambulatory Visit: Payer: Self-pay

## 2021-06-08 ENCOUNTER — Inpatient Hospital Stay (HOSPITAL_COMMUNITY): Payer: Medicare Other | Attending: Hematology

## 2021-06-08 VITALS — BP 131/81 | HR 62 | Temp 98.0°F | Resp 18

## 2021-06-08 DIAGNOSIS — Z5111 Encounter for antineoplastic chemotherapy: Secondary | ICD-10-CM | POA: Insufficient documentation

## 2021-06-08 DIAGNOSIS — Z79899 Other long term (current) drug therapy: Secondary | ICD-10-CM | POA: Diagnosis not present

## 2021-06-08 DIAGNOSIS — Z95828 Presence of other vascular implants and grafts: Secondary | ICD-10-CM

## 2021-06-08 DIAGNOSIS — C9200 Acute myeloblastic leukemia, not having achieved remission: Secondary | ICD-10-CM | POA: Diagnosis present

## 2021-06-08 DIAGNOSIS — D46Z Other myelodysplastic syndromes: Secondary | ICD-10-CM

## 2021-06-08 MED ORDER — SODIUM CHLORIDE 0.9% FLUSH
10.0000 mL | INTRAVENOUS | Status: DC | PRN
  Administered 2021-06-08: 10 mL

## 2021-06-08 MED ORDER — ONDANSETRON HCL 4 MG/2ML IJ SOLN
4.0000 mg | Freq: Once | INTRAMUSCULAR | Status: AC
  Administered 2021-06-08: 4 mg via INTRAVENOUS
  Filled 2021-06-08: qty 2

## 2021-06-08 MED ORDER — SODIUM CHLORIDE 0.9 % IV SOLN: Freq: Once | INTRAVENOUS | Status: AC

## 2021-06-08 MED ORDER — SODIUM CHLORIDE 0.9 % IV SOLN
15.0000 mg/m2 | Freq: Once | INTRAVENOUS | Status: AC
  Administered 2021-06-08: 35 mg via INTRAVENOUS
  Filled 2021-06-08: qty 7

## 2021-06-08 MED ORDER — HEPARIN SOD (PORK) LOCK FLUSH 100 UNIT/ML IV SOLN
500.0000 [IU] | Freq: Once | INTRAVENOUS | Status: AC | PRN
  Administered 2021-06-08: 500 [IU]

## 2021-06-08 NOTE — Patient Instructions (Signed)
[facility]  Discharge Instructions: Thank you for choosing [facility] to provide your oncology and hematology care.  If you have a lab appointment with the [facility], please come in thru the Main Entrance and check in at the main information desk.  Wear comfortable clothing and clothing appropriate for easy access to any Portacath or PICC line.   We strive to give you quality time with your provider. You may need to reschedule your appointment if you arrive late (15 or more minutes).  Arriving late affects you and other patients whose appointments are after yours.  Also, if you miss three or more appointments without notifying the office, you may be dismissed from the clinic at the providers discretion.      For prescription refill requests, have your pharmacy contact our office and allow 72 hours for refills to be completed.    Today you received the following chemotherapy and/or immunotherapy agents Decitabine, return as scheduled.   To help prevent nausea and vomiting after your treatment, we encourage you to take your nausea medication as directed.  BELOW ARE SYMPTOMS THAT SHOULD BE REPORTED IMMEDIATELY: *FEVER GREATER THAN 100.4 F (38 C) OR HIGHER *CHILLS OR SWEATING *NAUSEA AND VOMITING THAT IS NOT CONTROLLED WITH YOUR NAUSEA MEDICATION *UNUSUAL SHORTNESS OF BREATH *UNUSUAL BRUISING OR BLEEDING *URINARY PROBLEMS (pain or burning when urinating, or frequent urination) *BOWEL PROBLEMS (unusual diarrhea, constipation, pain near the anus) TENDERNESS IN MOUTH AND THROAT WITH OR WITHOUT PRESENCE OF ULCERS (sore throat, sores in mouth, or a toothache) UNUSUAL RASH, SWELLING OR PAIN  UNUSUAL VAGINAL DISCHARGE OR ITCHING   Items with * indicate a potential emergency and should be followed up as soon as possible or go to the Emergency Department if any problems should occur.  Please show the CHEMOTHERAPY ALERT CARD or IMMUNOTHERAPY ALERT CARD at check-in to  the Emergency Department and triage nurse.  Should you have questions after your visit or need to cancel or reschedule your appointment, please contact [facility] [phone]  and follow the prompts.  Office hours are 8:00 a.m. to 4:30 p.m. Monday - Friday. Please note that voicemails left after 4:00 p.m. may not be returned until the following business day.  We are closed weekends and major holidays. You have access to a nurse at all times for urgent questions. Please call the main number to the clinic [phone] and follow the prompts.  For any non-urgent questions, you may also contact your provider using MyChart. We now offer e-Visits for anyone 29 and older to request care online for non-urgent symptoms. For details visit [email or web address].   Also download the MyChart app! Go to the app store, search "MyChart", open the app, select [facility], and log in with your MyChart username and password.  Due to Covid, a mask is required upon entering the hospital/clinic. If you do not have a mask, one will be given to you upon arrival. For doctor visits, patients may have 1 support person aged 26 or older with them. For treatment visits, patients cannot have anyone with them due to current Covid guidelines and our immunocompromised population.

## 2021-06-08 NOTE — Progress Notes (Signed)
Patient tolerated chemotherapy with no complaints voiced. Side effects with management reviewed understanding verbalized. Port site clean and dry with no bruising or swelling noted at site. Good blood return noted before and after administration of chemotherapy. Band aid applied. Patient left in satisfactory condition with VSS and no s/s of distress noted. 

## 2021-06-09 ENCOUNTER — Other Ambulatory Visit (HOSPITAL_COMMUNITY): Payer: Self-pay

## 2021-06-09 ENCOUNTER — Encounter (HOSPITAL_COMMUNITY): Payer: Self-pay | Admitting: Hematology

## 2021-06-09 ENCOUNTER — Inpatient Hospital Stay (HOSPITAL_COMMUNITY): Payer: Medicare Other

## 2021-06-09 VITALS — BP 129/72 | HR 58 | Temp 97.1°F | Resp 20

## 2021-06-09 DIAGNOSIS — Z5111 Encounter for antineoplastic chemotherapy: Secondary | ICD-10-CM | POA: Diagnosis not present

## 2021-06-09 DIAGNOSIS — D46Z Other myelodysplastic syndromes: Secondary | ICD-10-CM

## 2021-06-09 DIAGNOSIS — Z95828 Presence of other vascular implants and grafts: Secondary | ICD-10-CM

## 2021-06-09 DIAGNOSIS — C9200 Acute myeloblastic leukemia, not having achieved remission: Secondary | ICD-10-CM

## 2021-06-09 MED ORDER — HEPARIN SOD (PORK) LOCK FLUSH 100 UNIT/ML IV SOLN
500.0000 [IU] | Freq: Once | INTRAVENOUS | Status: AC | PRN
  Administered 2021-06-09: 500 [IU]

## 2021-06-09 MED ORDER — ONDANSETRON HCL 4 MG/2ML IJ SOLN
4.0000 mg | Freq: Once | INTRAMUSCULAR | Status: AC
  Administered 2021-06-09: 4 mg via INTRAVENOUS
  Filled 2021-06-09: qty 2

## 2021-06-09 MED ORDER — SODIUM CHLORIDE 0.9 % IV SOLN: Freq: Once | INTRAVENOUS | Status: AC

## 2021-06-09 MED ORDER — SODIUM CHLORIDE 0.9 % IV SOLN
15.0000 mg/m2 | Freq: Once | INTRAVENOUS | Status: AC
  Administered 2021-06-09: 35 mg via INTRAVENOUS
  Filled 2021-06-09: qty 7

## 2021-06-09 MED ORDER — SODIUM CHLORIDE 0.9% FLUSH
10.0000 mL | INTRAVENOUS | Status: DC | PRN
  Administered 2021-06-09: 10 mL

## 2021-06-09 NOTE — Patient Instructions (Signed)
[facility]  Discharge Instructions: Thank you for choosing [facility] to provide your oncology and hematology care.  If you have a lab appointment with the [facility], please come in thru the Main Entrance and check in at the main information desk.  Wear comfortable clothing and clothing appropriate for easy access to any Portacath or PICC line.   We strive to give you quality time with your provider. You may need to reschedule your appointment if you arrive late (15 or more minutes).  Arriving late affects you and other patients whose appointments are after yours.  Also, if you miss three or more appointments without notifying the office, you may be dismissed from the clinic at the providers discretion.      For prescription refill requests, have your pharmacy contact our office and allow 72 hours for refills to be completed.    Today you received the following chemotherapy and/or immunotherapy agents Decitabine      To help prevent nausea and vomiting after your treatment, we encourage you to take your nausea medication as directed.  BELOW ARE SYMPTOMS THAT SHOULD BE REPORTED IMMEDIATELY: *FEVER GREATER THAN 100.4 F (38 C) OR HIGHER *CHILLS OR SWEATING *NAUSEA AND VOMITING THAT IS NOT CONTROLLED WITH YOUR NAUSEA MEDICATION *UNUSUAL SHORTNESS OF BREATH *UNUSUAL BRUISING OR BLEEDING *URINARY PROBLEMS (pain or burning when urinating, or frequent urination) *BOWEL PROBLEMS (unusual diarrhea, constipation, pain near the anus) TENDERNESS IN MOUTH AND THROAT WITH OR WITHOUT PRESENCE OF ULCERS (sore throat, sores in mouth, or a toothache) UNUSUAL RASH, SWELLING OR PAIN  UNUSUAL VAGINAL DISCHARGE OR ITCHING   Items with * indicate a potential emergency and should be followed up as soon as possible or go to the Emergency Department if any problems should occur.  Please show the CHEMOTHERAPY ALERT CARD or IMMUNOTHERAPY ALERT CARD at check-in to the Emergency  Department and triage nurse.  Should you have questions after your visit or need to cancel or reschedule your appointment, please contact [facility] [phone]  and follow the prompts.  Office hours are 8:00 a.m. to 4:30 p.m. Monday - Friday. Please note that voicemails left after 4:00 p.m. may not be returned until the following business day.  We are closed weekends and major holidays. You have access to a nurse at all times for urgent questions. Please call the main number to the clinic [phone] and follow the prompts.  For any non-urgent questions, you may also contact your provider using MyChart. We now offer e-Visits for anyone 77 and older to request care online for non-urgent symptoms. For details visit [email or web address].   Also download the MyChart app! Go to the app store, search "MyChart", open the app, select [facility], and log in with your MyChart username and password.  Due to Covid, a mask is required upon entering the hospital/clinic. If you do not have a mask, one will be given to you upon arrival. For doctor visits, patients may have 1 support person aged 77 or older with them. For treatment visits, patients cannot have anyone with them due to current Covid guidelines and our immunocompromised population.

## 2021-06-09 NOTE — Progress Notes (Signed)
Patient presents today for Decitabine infusion per providers order.  Vital signs within parameters for treatment.  Patient ha no new complaints at this time.    Decitabine given today per MD orders.  Stable during infusion without adverse affects.  Vital signs stable.  No complaints at this time.  Discharge from clinic via motorized wheelchair in stable condition.  Alert and oriented X 3.  Follow up with [facility] as scheduled.

## 2021-06-10 ENCOUNTER — Inpatient Hospital Stay (HOSPITAL_COMMUNITY): Payer: Medicare Other

## 2021-06-10 ENCOUNTER — Other Ambulatory Visit: Payer: Self-pay

## 2021-06-10 VITALS — BP 137/80 | HR 62 | Temp 97.0°F | Resp 18

## 2021-06-10 DIAGNOSIS — D46Z Other myelodysplastic syndromes: Secondary | ICD-10-CM

## 2021-06-10 DIAGNOSIS — Z5111 Encounter for antineoplastic chemotherapy: Secondary | ICD-10-CM | POA: Diagnosis not present

## 2021-06-10 DIAGNOSIS — Z95828 Presence of other vascular implants and grafts: Secondary | ICD-10-CM

## 2021-06-10 DIAGNOSIS — C9200 Acute myeloblastic leukemia, not having achieved remission: Secondary | ICD-10-CM

## 2021-06-10 MED ORDER — SODIUM CHLORIDE 0.9 % IV SOLN: Freq: Once | INTRAVENOUS | Status: AC

## 2021-06-10 MED ORDER — SODIUM CHLORIDE 0.9 % IV SOLN
15.0000 mg/m2 | Freq: Once | INTRAVENOUS | Status: AC
  Administered 2021-06-10: 35 mg via INTRAVENOUS
  Filled 2021-06-10: qty 7

## 2021-06-10 MED ORDER — SODIUM CHLORIDE 0.9% FLUSH
10.0000 mL | INTRAVENOUS | Status: DC | PRN
  Administered 2021-06-10: 10 mL

## 2021-06-10 MED ORDER — ONDANSETRON HCL 4 MG/2ML IJ SOLN
4.0000 mg | Freq: Once | INTRAMUSCULAR | Status: AC
  Administered 2021-06-10: 4 mg via INTRAVENOUS
  Filled 2021-06-10: qty 2

## 2021-06-10 MED ORDER — HEPARIN SOD (PORK) LOCK FLUSH 100 UNIT/ML IV SOLN
500.0000 [IU] | Freq: Once | INTRAVENOUS | Status: AC | PRN
  Administered 2021-06-10: 500 [IU]

## 2021-06-10 NOTE — Progress Notes (Signed)
Treatment given per orders. Patient tolerated it well without problems. Vitals stable and discharged home from clinic via wheelchair Follow up as scheduled.  

## 2021-06-10 NOTE — Patient Instructions (Signed)
[facility]  Discharge Instructions: Thank you for choosing [facility] to provide your oncology and hematology care.  If you have a lab appointment with the Cancer Center, please come in thru the Main Entrance and check in at the main information desk.  Wear comfortable clothing and clothing appropriate for easy access to any Portacath or PICC line.   We strive to give you quality time with your provider. You may need to reschedule your appointment if you arrive late (15 or more minutes).  Arriving late affects you and other patients whose appointments are after yours.  Also, if you miss three or more appointments without notifying the office, you may be dismissed from the clinic at the provider's discretion.      For prescription refill requests, have your pharmacy contact our office and allow 72 hours for refills to be completed.    Today you received the following chemotherapy and/or immunotherapy agents    To help prevent nausea and vomiting after your treatment, we encourage you to take your nausea medication as directed.  BELOW ARE SYMPTOMS THAT SHOULD BE REPORTED IMMEDIATELY: . *FEVER GREATER THAN 100.4 F (38 C) OR HIGHER . *CHILLS OR SWEATING . *NAUSEA AND VOMITING THAT IS NOT CONTROLLED WITH YOUR NAUSEA MEDICATION . *UNUSUAL SHORTNESS OF BREATH . *UNUSUAL BRUISING OR BLEEDING . *URINARY PROBLEMS (pain or burning when urinating, or frequent urination) . *BOWEL PROBLEMS (unusual diarrhea, constipation, pain near the anus) . TENDERNESS IN MOUTH AND THROAT WITH OR WITHOUT PRESENCE OF ULCERS (sore throat, sores in mouth, or a toothache) . UNUSUAL RASH, SWELLING OR PAIN  . UNUSUAL VAGINAL DISCHARGE OR ITCHING   Items with * indicate a potential emergency and should be followed up as soon as possible or go to the Emergency Department if any problems should occur.  Please show the CHEMOTHERAPY ALERT CARD or IMMUNOTHERAPY ALERT CARD at check-in to the  Emergency Department and triage nurse.  Should you have questions after your visit or need to cancel or reschedule your appointment, please contact  [facility] [phone]  and follow the prompts.  Office hours are 8:00 a.m. to 4:30 p.m. Monday - Friday. Please note that voicemails left after 4:00 p.m. may not be returned until the following business day.  We are closed weekends and major holidays. You have access to a nurse at all times for urgent questions. Please call the main number to the clinic [phone] and follow the prompts.  For any non-urgent questions, you may also contact your provider using MyChart. We now offer e-Visits for anyone 18 and older to request care online for non-urgent symptoms. For details visit [email or web address].   Also download the MyChart app! Go to the app store, search "MyChart", open the app, select [facility], and log in with your MyChart username and password.  Due to Covid, a mask is required upon entering the hospital/clinic. If you do not have a mask, one will be given to you upon arrival. For doctor visits, patients may have 1 support person aged 18 or older with them. For treatment visits, patients cannot have anyone with them due to current Covid guidelines and our immunocompromised population.  

## 2021-06-30 ENCOUNTER — Other Ambulatory Visit (HOSPITAL_COMMUNITY): Payer: Self-pay

## 2021-07-05 ENCOUNTER — Other Ambulatory Visit (HOSPITAL_COMMUNITY): Payer: Self-pay

## 2021-07-06 ENCOUNTER — Other Ambulatory Visit (HOSPITAL_COMMUNITY): Payer: Self-pay

## 2021-07-06 MED ORDER — HYDROCODONE-ACETAMINOPHEN 10-325 MG PO TABS: 1.0000 | ORAL_TABLET | Freq: Four times a day (QID) | ORAL | 0 refills | Status: DC | PRN

## 2021-07-13 ENCOUNTER — Other Ambulatory Visit (HOSPITAL_COMMUNITY): Payer: Self-pay

## 2021-07-18 ENCOUNTER — Other Ambulatory Visit: Payer: Self-pay

## 2021-07-18 ENCOUNTER — Inpatient Hospital Stay (HOSPITAL_COMMUNITY): Payer: Medicare Other

## 2021-07-18 ENCOUNTER — Inpatient Hospital Stay (HOSPITAL_COMMUNITY): Payer: Medicare Other | Attending: Hematology | Admitting: Hematology

## 2021-07-18 VITALS — BP 154/74 | HR 64 | Temp 98.3°F | Resp 18

## 2021-07-18 VITALS — BP 147/85 | HR 75 | Temp 97.1°F | Resp 18 | Ht 69.5 in | Wt 221.4 lb

## 2021-07-18 DIAGNOSIS — K760 Fatty (change of) liver, not elsewhere classified: Secondary | ICD-10-CM | POA: Insufficient documentation

## 2021-07-18 DIAGNOSIS — Z5111 Encounter for antineoplastic chemotherapy: Secondary | ICD-10-CM | POA: Insufficient documentation

## 2021-07-18 DIAGNOSIS — Z79899 Other long term (current) drug therapy: Secondary | ICD-10-CM | POA: Insufficient documentation

## 2021-07-18 DIAGNOSIS — C9200 Acute myeloblastic leukemia, not having achieved remission: Secondary | ICD-10-CM | POA: Diagnosis present

## 2021-07-18 DIAGNOSIS — F0390 Unspecified dementia without behavioral disturbance: Secondary | ICD-10-CM | POA: Insufficient documentation

## 2021-07-18 DIAGNOSIS — D696 Thrombocytopenia, unspecified: Secondary | ICD-10-CM | POA: Insufficient documentation

## 2021-07-18 DIAGNOSIS — I129 Hypertensive chronic kidney disease with stage 1 through stage 4 chronic kidney disease, or unspecified chronic kidney disease: Secondary | ICD-10-CM | POA: Insufficient documentation

## 2021-07-18 DIAGNOSIS — Z95828 Presence of other vascular implants and grafts: Secondary | ICD-10-CM

## 2021-07-18 DIAGNOSIS — D46Z Other myelodysplastic syndromes: Secondary | ICD-10-CM

## 2021-07-18 DIAGNOSIS — R918 Other nonspecific abnormal finding of lung field: Secondary | ICD-10-CM | POA: Diagnosis not present

## 2021-07-18 LAB — CBC WITH DIFFERENTIAL/PLATELET
Abs Immature Granulocytes: 0.08 10*3/uL — ABNORMAL HIGH (ref 0.00–0.07)
Basophils Absolute: 0.1 10*3/uL (ref 0.0–0.1)
Basophils Relative: 1 %
Eosinophils Absolute: 0.1 10*3/uL (ref 0.0–0.5)
Eosinophils Relative: 2 %
HCT: 45.4 % (ref 39.0–52.0)
Hemoglobin: 14.8 g/dL (ref 13.0–17.0)
Immature Granulocytes: 2 %
Lymphocytes Relative: 21 %
Lymphs Abs: 1 10*3/uL (ref 0.7–4.0)
MCH: 30.8 pg (ref 26.0–34.0)
MCHC: 32.6 g/dL (ref 30.0–36.0)
MCV: 94.4 fL (ref 80.0–100.0)
Monocytes Absolute: 0.4 10*3/uL (ref 0.1–1.0)
Monocytes Relative: 8 %
Neutro Abs: 3.2 10*3/uL (ref 1.7–7.7)
Neutrophils Relative %: 66 %
Platelets: 144 10*3/uL — ABNORMAL LOW (ref 150–400)
RBC: 4.81 MIL/uL (ref 4.22–5.81)
RDW: 15.6 % — ABNORMAL HIGH (ref 11.5–15.5)
WBC: 4.8 10*3/uL (ref 4.0–10.5)
nRBC: 0 % (ref 0.0–0.2)

## 2021-07-18 LAB — COMPREHENSIVE METABOLIC PANEL
ALT: 32 U/L (ref 0–44)
AST: 23 U/L (ref 15–41)
Albumin: 3.8 g/dL (ref 3.5–5.0)
Alkaline Phosphatase: 67 U/L (ref 38–126)
Anion gap: 9 (ref 5–15)
BUN: 20 mg/dL (ref 8–23)
CO2: 26 mmol/L (ref 22–32)
Calcium: 9.5 mg/dL (ref 8.9–10.3)
Chloride: 106 mmol/L (ref 98–111)
Creatinine, Ser: 1.03 mg/dL (ref 0.61–1.24)
GFR, Estimated: >60 mL/min (ref >60)
Glucose, Bld: 112 mg/dL — ABNORMAL HIGH (ref 70–99)
Potassium: 4.6 mmol/L (ref 3.5–5.1)
Sodium: 141 mmol/L (ref 135–145)
Total Bilirubin: 0.4 mg/dL (ref 0.3–1.2)
Total Protein: 6.2 g/dL — ABNORMAL LOW (ref 6.5–8.1)

## 2021-07-18 LAB — MAGNESIUM: Magnesium: 1.9 mg/dL (ref 1.7–2.4)

## 2021-07-18 LAB — LACTATE DEHYDROGENASE: LDH: 151 U/L (ref 98–192)

## 2021-07-18 MED ORDER — SODIUM CHLORIDE 0.9 % IV SOLN: Freq: Once | INTRAVENOUS | Status: AC

## 2021-07-18 MED ORDER — SODIUM CHLORIDE 0.9% FLUSH
10.0000 mL | INTRAVENOUS | Status: DC | PRN
  Administered 2021-07-18: 10 mL

## 2021-07-18 MED ORDER — HEPARIN SOD (PORK) LOCK FLUSH 100 UNIT/ML IV SOLN
500.0000 [IU] | Freq: Once | INTRAVENOUS | Status: AC | PRN
  Administered 2021-07-18: 500 [IU]

## 2021-07-18 MED ORDER — ONDANSETRON HCL 4 MG/2ML IJ SOLN
4.0000 mg | Freq: Once | INTRAMUSCULAR | Status: AC
  Administered 2021-07-18: 4 mg via INTRAVENOUS
  Filled 2021-07-18: qty 2

## 2021-07-18 MED ORDER — SODIUM CHLORIDE 0.9 % IV SOLN
15.0000 mg/m2 | Freq: Once | INTRAVENOUS | Status: AC
  Administered 2021-07-18: 35 mg via INTRAVENOUS
  Filled 2021-07-18: qty 7

## 2021-07-18 NOTE — Patient Instructions (Signed)
[facility]  Discharge Instructions: ?Thank you for choosing [facility] to provide your oncology and hematology care.  ?If you have a lab appointment with the [facility], please come in thru the Main Entrance and check in at the main information desk. ? ?Wear comfortable clothing and clothing appropriate for easy access to any Portacath or PICC line.  ? ?We strive to give you quality time with your provider. You may need to reschedule your appointment if you arrive late (15 or more minutes).  Arriving late affects you and other patients whose appointments are after yours.  Also, if you miss three or more appointments without notifying the office, you may be dismissed from the clinic at the provider?s discretion.    ?  ?For prescription refill requests, have your pharmacy contact our office and allow 72 hours for refills to be completed.   ? ?Today you received the following chemotherapy and/or immunotherapy agents Decitabine. ?  ?To help prevent nausea and vomiting after your treatment, we encourage you to take your nausea medication as directed. ? ?BELOW ARE SYMPTOMS THAT SHOULD BE REPORTED IMMEDIATELY: ?*FEVER GREATER THAN 100.4 F (38 ?C) OR HIGHER ?*CHILLS OR SWEATING ?*NAUSEA AND VOMITING THAT IS NOT CONTROLLED WITH YOUR NAUSEA MEDICATION ?*UNUSUAL SHORTNESS OF BREATH ?*UNUSUAL BRUISING OR BLEEDING ?*URINARY PROBLEMS (pain or burning when urinating, or frequent urination) ?*BOWEL PROBLEMS (unusual diarrhea, constipation, pain near the anus) ?TENDERNESS IN MOUTH AND THROAT WITH OR WITHOUT PRESENCE OF ULCERS (sore throat, sores in mouth, or a toothache) ?UNUSUAL RASH, SWELLING OR PAIN  ?UNUSUAL VAGINAL DISCHARGE OR ITCHING  ? ?Items with * indicate a potential emergency and should be followed up as soon as possible or go to the Emergency Department if any problems should occur. ? ?Please show the CHEMOTHERAPY ALERT CARD or IMMUNOTHERAPY ALERT CARD at check-in to the Emergency  Department and triage nurse. ? ?Should you have questions after your visit or need to cancel or reschedule your appointment, please contact [facility] [phone]  and follow the prompts.  Office hours are 8:00 a.m. to 4:30 p.m. Monday - Friday. Please note that voicemails left after 4:00 p.m. may not be returned until the following business day.  We are closed weekends and major holidays. You have access to a nurse at all times for urgent questions. Please call the main number to the clinic [phone] and follow the prompts. ? ?For any non-urgent questions, you may also contact your provider using MyChart. We now offer e-Visits for anyone 70 and older to request care online for non-urgent symptoms. For details visit [email or web address]. ?  ?Also download the MyChart app! Go to the app store, search "MyChart", open the app, select , and log in with your MyChart username and password. ? ?Due to Covid, a mask is required upon entering the hospital/clinic. If you do not have a mask, one will be given to you upon arrival. For doctor visits, patients may have 1 support person aged 71 or older with them. For treatment visits, patients cannot have anyone with them due to current Covid guidelines and our immunocompromised population.  ?

## 2021-07-18 NOTE — Progress Notes (Signed)
Patient is taking Venetoclax as prescribed.  He has not missed any doses and reports no side effects at this time.   ? ?Patient has been examined by Dr. Delton Coombes, and vital signs and labs have been reviewed. ANC, Creatinine, LFTs, hemoglobin, and platelets are within treatment parameters per M.D. - pt may proceed with treatment.    ?

## 2021-07-18 NOTE — Patient Instructions (Signed)
[place] at [facility] ?Discharge Instructions ? ? ?You were seen and examined today by Dr. Delton Coombes. ? ?He reviewed your lab results which are normal/stable. ? ?We will proceed with your treatment today and the rest of the week. ? ?Return as scheduled in 4 weeks.  ? ? ?Thank you for choosing [place] at [facility] to provide your oncology and hematology care.  To afford each patient quality time with our provider, please arrive at least 15 minutes before your scheduled appointment time.  ? ?If you have a lab appointment with the [facility] please come in thru the Main Entrance and check in at the main information desk. ? ?You need to re-schedule your appointment should you arrive 10 or more minutes late.  We strive to give you quality time with our providers, and arriving late affects you and other patients whose appointments are after yours.  Also, if you no show three or more times for appointments you may be dismissed from the clinic at the providers discretion.     ?Again, thank you for choosing [facility].  Our hope is that these requests will decrease the amount of time that you wait before being seen by our physicians.       ?_____________________________________________________________ ? ?Should you have questions after your visit to [facility], please contact our office at [phone] and follow the prompts.  Our office hours are 8:00 a.m. and 4:30 p.m. Monday - Friday.  Please note that voicemails left after 4:00 p.m. may not be returned until the following business day.  We are closed weekends and major holidays.  You do have access to a nurse 24-7, just call the main number to the clinic [phone] and do not press any options, hold on the line and a nurse will answer the phone.   ? ?For prescription refill requests, have your pharmacy contact our office and allow 72 hours.   ? ?Due to Covid, you will need to wear  a mask upon entering the hospital. If you do not have a mask, a mask will be given to you at the Main Entrance upon arrival. For doctor visits, patients may have 1 support person age 88 or older with them. For treatment visits, patients can not have anyone with them due to social distancing guidelines and our immunocompromised population.  ? ?   ?

## 2021-07-18 NOTE — Progress Notes (Signed)
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[facility])  08/14/2018 Initial Diagnosis   MDS (myelodysplastic syndrome), high grade ([facility])   08/22/2018 - 12/06/2018 Chemotherapy   The patient had palonosetron (ALOXI) injection 0.25 mg, 0.25 mg, Intravenous,  Once, 4 of 6 cycles Administration: 0.25 mg (08/22/2018), 0.25 mg (08/26/2018), 0.25 mg (08/28/2018), 0.25 mg (08/30/2018), 0.25 mg (10/01/2018), 0.25 mg (10/02/2018), 0.25 mg (11/04/2018), 0.25 mg (09/23/2018), 0.25 mg (09/25/2018), 0.25 mg (09/27/2018), 0.25 mg (10/28/2018), 0.25 mg (10/30/2018), 0.25 mg (11/01/2018), 0.25 mg (12/02/2018), 0.25 mg (12/04/2018), 0.25 mg (12/06/2018) azaCITIDine (VIDAZA) 100 mg in sodium chloride 0.9 % 50 mL chemo infusion, 110 mg (66.7 % of original dose 75 mg/m2), Intravenous, Once, 4 of 6 cycles Dose modification: 50 mg/m2 (original dose 75 mg/m2, Cycle 1, Reason: Provider Judgment), 50 mg/m2 (original dose 75 mg/m2, Cycle 2, Reason: Provider Judgment) Administration: 100 mg (08/22/2018), 100 mg (08/23/2018), 100 mg (08/26/2018), 100 mg (08/27/2018), 100 mg (08/28/2018), 100 mg (08/29/2018), 100 mg (08/30/2018), 100 mg (10/01/2018), 100 mg (10/02/2018), 100 mg (11/04/2018), 100 mg (11/05/2018), 100 mg (09/23/2018), 100 mg (09/24/2018), 100 mg (09/25/2018), 100 mg (09/26/2018), 100 mg (09/27/2018), 100 mg (10/28/2018), 100 mg (10/29/2018), 100 mg (10/30/2018), 100 mg (10/31/2018), 100 mg (11/01/2018), 100 mg (12/02/2018), 100 mg (12/03/2018), 100 mg (12/04/2018), 100 mg (12/05/2018), 100 mg (12/06/2018)   for chemotherapy treatment.     01/20/2019 -   Chemotherapy   Patient is on Treatment Plan : MYELODYSPLASIA Decitabine D1-5 q42d     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[facility])  01/07/2019 Initial Diagnosis   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[facility])   01/20/2019 -  Chemotherapy   Patient is on Treatment Plan : MYELODYSPLASIA Decitabine D1-5 q42d       CANCER STAGING:  Cancer Staging  No matching staging information was found for the patient.  INTERVAL HISTORY:  [name], a 76 y.o. male, returns for routine follow-up and consideration for next cycle of chemotherapy. [name] was last seen on 06/06/2021.  Due for cycle #22 of Decitabine today.   Overall, he tells me he has been feeling pretty well. He denies recent infections, fevers, bleeding, and fatigue. He denies n/v/d.   Overall, he feels ready for next cycle of chemo today.   REVIEW OF SYSTEMS:  Review of Systems  Constitutional:  Negative for appetite change, fatigue and fever.  HENT:   Negative for nosebleeds.   Respiratory:  Negative for hemoptysis.   Gastrointestinal:  Negative for blood in stool, diarrhea, nausea and vomiting.  Genitourinary:  Negative for hematuria.   Musculoskeletal:  Positive for arthralgias (4/10).  All other systems reviewed and are negative.  PAST MEDICAL/SURGICAL HISTORY:  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   Cancer ([facility]) 01-28-2014   skin cancer   CKD (chronic kidney disease), stage III (HCC)    GERD (gastroesophageal reflux disease)    Heart murmur    NOTED DURING PHYSICAL WHEN HE WAS ENLISTING IN [facility] , DIDNT KNOW UNTIL THAT TIME AND REPORTS , "THATS  THE LAST I HEARD ABOUT IT "    History of hypertension    no longer issue   History of kidney stones    History of malignant melanoma of skin    excision top of scalp 2015-- no recurrence   History of urinary retention    post op lumbar fusion surgery 04/ 2016   Hypertension    Kidney dysfunction    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;  Service: Orthopedics;  Laterality: Right;   COLONOSCOPY     CYSTO/  LEFT RETROGRADE PYELOGRAM  11/21/2010   CYSTOSCOPY WITH STENT PLACEMENT Left 03/09/2016   Procedure: CYSTOSCOPY WITH STENT PLACEMENT;  Surgeon: Franchot Gallo, MD;  Location: [facility];  Service: Urology;  Laterality: Left;   CYSTOSCOPY/RETROGRADE/URETEROSCOPY/STONE EXTRACTION WITH BASKET Left 03/09/2016   Procedure: CYSTOSCOPY/RETROGRADE/URETEROSCOPY/STONE EXTRACTION WITH BASKET;  Surgeon: Franchot Gallo, MD;  Location: [facility];  Service: Urology;  Laterality: Left;   LEFT URETEROSCOPIC LASER LITHOTRIPSY STONE EXTRACTION/ STENT PLACEMENT  05/23/2010   MOHS SURGERY     TOP OF THE HEAD    ORIF ANKLE FRACTURE Right 1978   PORT-A-CATH REMOVAL Right 02/14/2019   Procedure: MINOR REMOVAL PORT-A-CATH;  Surgeon: Aviva Signs, MD;  Location: [facility];  Service: General;  Laterality: Right;   PORTACATH PLACEMENT Right 08/19/2018   Procedure: INSERTION PORT-A-CATH (attached catheter in right subclavian);  Surgeon: Aviva Signs, MD;  Location: [facility];  Service: General;  Laterality: Right;   PORTACATH PLACEMENT Left 05/16/2019   Procedure: INSERTION PORT-A-CATH (attached catheter in left subclavian);  Surgeon: Aviva Signs, MD;  Location: [facility];  Service: General;  Laterality: Left;   POSTERIOR LUMBAR FUSION  08/21/2014   laminectomy and decompression L2 -- L5   RIGHT LOWER LEG SURGERY  X3   1975 to 1976   including ORIF   TONSILLECTOMY AND ADENOIDECTOMY  5809   UMBILICAL HERNIA REPAIR  2009 approx    SOCIAL HISTORY:  Social History   Socioeconomic History   Marital status: Married    Spouse name: Not on file   Number of children: Not on file   Years of education: Not on file   Highest education level: Not on file  Occupational History   Not on file  Tobacco Use   Smoking status: Former    Years: 20.00    Types: Cigarettes    Quit date: 07/17/1986    Years since quitting: 35.0   Smokeless tobacco: Never  Vaping Use   Vaping Use: Never used  Substance and Sexual Activity   Alcohol use: Yes    Alcohol/week: 7.0 - 14.0 standard drinks    Types: 7 - 14 Cans of beer per week    Comment: 1 -2 beer daily   Drug use: No   Sexual activity: Not Currently  Other Topics Concern   Not on file  Social History Narrative   Not on file   Social Determinants of  Health   Financial Resource Strain: Not on file  Food Insecurity: Not on file  Transportation Needs: Not on file  Physical Activity: Not on file  Stress: Not on file  Social Connections: Not on file  Intimate Partner Violence: Not on file    FAMILY HISTORY:  Family History  Problem Relation Age of Onset   Stroke Mother    Prostate cancer Father    Bone cancer Father    Diverticulitis Father    Rheum arthritis Sister    Urinary tract infection Sister    Colon cancer Neg Hx     CURRENT MEDICATIONS:  Current Outpatient Medications  Medication Sig Dispense Refill   ACETAMINOPHEN EXTRA STRENGTH 500 MG capsule      ALPRAZolam (XANAX) 0.25 MG tablet Take 1 tablet by mouth twice daily as needed for anxiety 60 tablet 0   amLODipine (NORVASC) 2.5 MG tablet Take 1 tablet (2.5 mg total) by mouth daily. 90 tablet 2   cetirizine (ZYRTEC) 10 MG tablet Take 10 mg by mouth daily. IN THE MORNING     diclofenac Sodium (VOLTAREN) 1 % GEL Apply three times daily to knees as needed for pain 50 g 3   docusate sodium  (COLACE) 100 MG capsule Take 100 mg by mouth at bedtime.      donepezil (ARICEPT) 10 MG tablet Take 1 tablet (10 mg total) by mouth at bedtime. 30 tablet 3   HYDROcodone-acetaminophen (NORCO) 10-325 MG tablet Take 1 tablet by mouth every 6 (six) hours as needed. 112 tablet 0   ipratropium (ATROVENT) 0.03 % nasal spray Place 2 sprays into both nostrils 3 (three) times daily. 30 mL 3   lansoprazole (PREVACID) 15 MG capsule Take 15 mg by mouth daily.      lidocaine-prilocaine (EMLA) cream Apply 1 application topically See admin instructions. ONE HOUR PRIOR TO CHEMOTHERAPY APPOINTMENT 30 g 6   magnesium oxide (MAG-OX) 400 (241.3 Mg) MG tablet Take 1 tablet (400 mg total) by mouth 3 (three) times daily. 90 tablet 3   Multiple Vitamins-Minerals (CENTRUM SILVER 50+MEN) TABS SMARTSIG:1 By Mouth     SF 5000 PLUS 1.1 % CREA dental cream SMARTSIG:Sparingly By Mouth Every Night     tamsulosin (FLOMAX) 0.4 MG CAPS capsule Take 1 capsule (0.4 mg total) by mouth every evening. 30 capsule 6   venetoclax (VENCLEXTA) 100 MG tablet TAKE 2 TABLETS (200 MG) BY MOUTH DAILY 60 tablet 2   No current facility-administered medications for this visit.   Facility-Administered Medications Ordered in Other Visits  Medication Dose Route Frequency Provider Last Rate Last Admin   sodium chloride flush (NS) 0.9 % injection 10 mL  10 mL Intracatheter PRN [name], MD   10 mL at 11/21/19 1030   sodium chloride flush (NS) 0.9 % injection 20 mL  20 mL Intravenous PRN [name], MD   20 mL at 07/21/19 1053    ALLERGIES:  No Known Allergies  PHYSICAL EXAM:  Performance status (ECOG): 1 - Symptomatic but completely ambulatory  Vitals:   07/18/21 1035  BP: (!) 147/85  Pulse: 75  Resp: 18  Temp: (!) 97.1 F (36.2 C)  SpO2: 98%   Wt Readings from Last 3 Encounters:  07/18/21 221 lb 6.4 oz (100.4 kg)  06/06/21 222 lb 3.2 oz (100.8 kg)  04/25/21 218 lb 11.1 oz (99.2 kg)   Physical Exam Vitals  reviewed.  Constitutional:      Appearance: Normal appearance. He is obese.     Comments:  In wheelchair  Cardiovascular:     Rate and Rhythm: Normal rate and regular rhythm.     Pulses: Normal pulses.     Heart sounds: Normal heart sounds.  Pulmonary:     Effort: Pulmonary effort is normal.     Breath sounds: Normal breath sounds.  Neurological:     General: No focal deficit present.     Mental Status: He is alert and oriented to person, place, and time.  Psychiatric:        Mood and Affect: Mood normal.        Behavior: Behavior normal.    LABORATORY DATA:  I have reviewed the labs as listed.  CBC Latest Ref Rng & Units 07/18/2021 06/06/2021 04/25/2021  WBC 4.0 - 10.5 K/uL 4.8 4.1 5.6  Hemoglobin 13.0 - 17.0 g/dL 14.8 15.2 15.3  Hematocrit 39.0 - 52.0 % 45.4 46.0 47.2  Platelets 150 - 400 K/uL 144(L) 134(L) 120(L)   CMP Latest Ref Rng & Units 07/18/2021 06/06/2021 04/25/2021  Glucose 70 - 99 mg/dL 112(H) 105(H) 112(H)  BUN 8 - 23 mg/dL 20 19 25(H)  Creatinine 0.61 - 1.24 mg/dL 1.03 1.04 1.04  Sodium 135 - 145 mmol/L 141 138 138  Potassium 3.5 - 5.1 mmol/L 4.6 4.8 4.3  Chloride 98 - 111 mmol/L 106 106 105  CO2 22 - 32 mmol/L '26 27 26  '$ Calcium 8.9 - 10.3 mg/dL 9.5 9.3 9.2  Total Protein 6.5 - 8.1 g/dL 6.2(L) 6.3(L) 6.4(L)  Total Bilirubin 0.3 - 1.2 mg/dL 0.4 0.3 0.4  Alkaline Phos 38 - 126 U/L 67 69 65  AST 15 - 41 U/L '23 23 22  '$ ALT 0 - 44 U/L 32 26 25    DIAGNOSTIC IMAGING:  I have independently reviewed the scans and discussed with the patient. No results found.   ASSESSMENT:  1.  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  -BMBX on 02/25/2019 after cycle 1 did not show any evidence of leukemia. -CT CAP on 02/12/2019 shows numerous bilateral irregular/spiculated pulmonary nodules measuring up to 14 mm, nonspecific.  Hepatic steatosis.  Spleen is normal. -I have talked to  Dr[facility]avandi at [facility] per patient request.  There are clinical trials available upon progression of his AML.  There are also some clinical trials available now based on MRD positivity.  Patient not interested in moving to [place] at this time.    PLAN:  1.  AML: - He does not report any infections.  No hospitalizations. - No side effects from decitabine or venetoclax. - Reviewed labs from 07/18/2021 which showed normal LFTs and creatinine.  CBC was grossly normal with mild thrombocytopenia. - Proceed with the next cycle today.  He will continue venetoclax 200 mg daily for 2 weeks. - RTC 6 weeks for follow-up.   2.  Arthralgias: - Continue hydrocodone 10/325 every 6 hours as needed.   3.  Hypomagnesemia: - Continue magnesium 3 times daily.  Magnesium today is 1.9.   4.  Hypertension: - Continue Norvasc 2.5 mg daily.  Blood pressure today is 147/85.   5.  Dementia: - Continue Aricept 10 mg daily.   Orders placed this encounter:  No orders of the defined types were placed in this encounter.    [name], MD [facility] [phone]   I, Thana Ates, am acting as a scribe for Dr. [name].  Kinnie Scales MD, have  reviewed the above documentation for accuracy and completeness, and I agree with the above.

## 2021-07-18 NOTE — Progress Notes (Signed)
Pt present today for Decitabine per provider's order. Labs and vital signs WNL for treatment. Okay to proceed with treatment today per Dr.K. ? ?Decitabine given today per MD orders. Tolerated infusion without adverse affects. Vital signs stable. No complaints at this time. Discharged from clinic via motorized chair in stable condition. Alert and oriented x 3. F/U with [facility] as scheduled.   ?

## 2021-07-19 ENCOUNTER — Inpatient Hospital Stay (HOSPITAL_COMMUNITY): Payer: Medicare Other

## 2021-07-19 VITALS — BP 133/83 | HR 75 | Temp 97.7°F | Resp 20

## 2021-07-19 DIAGNOSIS — C9200 Acute myeloblastic leukemia, not having achieved remission: Secondary | ICD-10-CM

## 2021-07-19 DIAGNOSIS — Z95828 Presence of other vascular implants and grafts: Secondary | ICD-10-CM

## 2021-07-19 DIAGNOSIS — D46Z Other myelodysplastic syndromes: Secondary | ICD-10-CM

## 2021-07-19 DIAGNOSIS — Z5111 Encounter for antineoplastic chemotherapy: Secondary | ICD-10-CM | POA: Diagnosis not present

## 2021-07-19 MED ORDER — SODIUM CHLORIDE 0.9 % IV SOLN: Freq: Once | INTRAVENOUS | Status: AC

## 2021-07-19 MED ORDER — HEPARIN SOD (PORK) LOCK FLUSH 100 UNIT/ML IV SOLN
500.0000 [IU] | Freq: Once | INTRAVENOUS | Status: AC | PRN
  Administered 2021-07-19: 500 [IU]

## 2021-07-19 MED ORDER — SODIUM CHLORIDE 0.9 % IV SOLN
15.0000 mg/m2 | Freq: Once | INTRAVENOUS | Status: AC
  Administered 2021-07-19: 35 mg via INTRAVENOUS
  Filled 2021-07-19: qty 7

## 2021-07-19 MED ORDER — ONDANSETRON HCL 4 MG/2ML IJ SOLN
4.0000 mg | Freq: Once | INTRAMUSCULAR | Status: AC
  Administered 2021-07-19: 4 mg via INTRAVENOUS
  Filled 2021-07-19: qty 2

## 2021-07-19 MED ORDER — SODIUM CHLORIDE 0.9% FLUSH
10.0000 mL | INTRAVENOUS | Status: DC | PRN
  Administered 2021-07-19: 10 mL

## 2021-07-19 NOTE — Progress Notes (Signed)
Treatment given per orders. Patient tolerated it well without problems. Vitals stable and discharged home from clinic via wheelchair Follow up as scheduled.  

## 2021-07-19 NOTE — Patient Instructions (Signed)
[facility]  Discharge Instructions: Thank you for choosing [facility] to provide your oncology and hematology care.  If you have a lab appointment with the Cancer Center, please come in thru the Main Entrance and check in at the main information desk.  Wear comfortable clothing and clothing appropriate for easy access to any Portacath or PICC line.   We strive to give you quality time with your provider. You may need to reschedule your appointment if you arrive late (15 or more minutes).  Arriving late affects you and other patients whose appointments are after yours.  Also, if you miss three or more appointments without notifying the office, you may be dismissed from the clinic at the provider's discretion.      For prescription refill requests, have your pharmacy contact our office and allow 72 hours for refills to be completed.        To help prevent nausea and vomiting after your treatment, we encourage you to take your nausea medication as directed.  BELOW ARE SYMPTOMS THAT SHOULD BE REPORTED IMMEDIATELY: *FEVER GREATER THAN 100.4 F (38 C) OR HIGHER *CHILLS OR SWEATING *NAUSEA AND VOMITING THAT IS NOT CONTROLLED WITH YOUR NAUSEA MEDICATION *UNUSUAL SHORTNESS OF BREATH *UNUSUAL BRUISING OR BLEEDING *URINARY PROBLEMS (pain or burning when urinating, or frequent urination) *BOWEL PROBLEMS (unusual diarrhea, constipation, pain near the anus) TENDERNESS IN MOUTH AND THROAT WITH OR WITHOUT PRESENCE OF ULCERS (sore throat, sores in mouth, or a toothache) UNUSUAL RASH, SWELLING OR PAIN  UNUSUAL VAGINAL DISCHARGE OR ITCHING   Items with * indicate a potential emergency and should be followed up as soon as possible or go to the Emergency Department if any problems should occur.  Please show the CHEMOTHERAPY ALERT CARD or IMMUNOTHERAPY ALERT CARD at check-in to the Emergency Department and triage nurse.  Should you have questions after your visit or need to cancel  or reschedule your appointment, please contact [facility] [phone]  and follow the prompts.  Office hours are 8:00 a.m. to 4:30 p.m. Monday - Friday. Please note that voicemails left after 4:00 p.m. may not be returned until the following business day.  We are closed weekends and major holidays. You have access to a nurse at all times for urgent questions. Please call the main number to the clinic [phone] and follow the prompts.  For any non-urgent questions, you may also contact your provider using MyChart. We now offer e-Visits for anyone 18 and older to request care online for non-urgent symptoms. For details visit [email or web address].   Also download the MyChart app! Go to the app store, search "MyChart", open the app, select [facility], and log in with your MyChart username and password.  Due to Covid, a mask is required upon entering the hospital/clinic. If you do not have a mask, one will be given to you upon arrival. For doctor visits, patients may have 1 support person aged 18 or older with them. For treatment visits, patients cannot have anyone with them due to current Covid guidelines and our immunocompromised population.  

## 2021-07-20 ENCOUNTER — Inpatient Hospital Stay (HOSPITAL_COMMUNITY): Payer: Medicare Other

## 2021-07-20 ENCOUNTER — Other Ambulatory Visit: Payer: Self-pay

## 2021-07-20 VITALS — BP 132/80 | HR 66 | Temp 97.0°F | Resp 18

## 2021-07-20 DIAGNOSIS — D46Z Other myelodysplastic syndromes: Secondary | ICD-10-CM

## 2021-07-20 DIAGNOSIS — Z5111 Encounter for antineoplastic chemotherapy: Secondary | ICD-10-CM | POA: Diagnosis not present

## 2021-07-20 DIAGNOSIS — C9200 Acute myeloblastic leukemia, not having achieved remission: Secondary | ICD-10-CM

## 2021-07-20 DIAGNOSIS — Z95828 Presence of other vascular implants and grafts: Secondary | ICD-10-CM

## 2021-07-20 MED ORDER — ONDANSETRON HCL 4 MG/2ML IJ SOLN
4.0000 mg | Freq: Once | INTRAMUSCULAR | Status: AC
  Administered 2021-07-20: 4 mg via INTRAVENOUS
  Filled 2021-07-20: qty 2

## 2021-07-20 MED ORDER — SODIUM CHLORIDE 0.9% FLUSH
10.0000 mL | INTRAVENOUS | Status: DC | PRN
  Administered 2021-07-20: 10 mL

## 2021-07-20 MED ORDER — SODIUM CHLORIDE 0.9 % IV SOLN: Freq: Once | INTRAVENOUS | Status: AC

## 2021-07-20 MED ORDER — SODIUM CHLORIDE 0.9 % IV SOLN
15.0000 mg/m2 | Freq: Once | INTRAVENOUS | Status: AC
  Administered 2021-07-20: 35 mg via INTRAVENOUS
  Filled 2021-07-20: qty 7

## 2021-07-20 MED ORDER — HEPARIN SOD (PORK) LOCK FLUSH 100 UNIT/ML IV SOLN
500.0000 [IU] | Freq: Once | INTRAVENOUS | Status: AC | PRN
  Administered 2021-07-20: 500 [IU]

## 2021-07-20 NOTE — Progress Notes (Signed)
Patient presents today for chemotherapy infusion.  Patient is in satisfactory condition with no complaints voiced.  Vital signs are stable.  We will proceed with treatment per MD orders.  ? ?Patient tolerated treatment well with no complaints voiced.  Patient left via motorized wheelchair in stable condition.  Vital signs stable at discharge.  Follow up as scheduled.    ?

## 2021-07-20 NOTE — Patient Instructions (Signed)
[facility]  Discharge Instructions: Thank you for choosing [facility] to provide your oncology and hematology care.  If you have a lab appointment with the Cancer Center, please come in thru the Main Entrance and check in at the main information desk.  Wear comfortable clothing and clothing appropriate for easy access to any Portacath or PICC line.   We strive to give you quality time with your provider. You may need to reschedule your appointment if you arrive late (15 or more minutes).  Arriving late affects you and other patients whose appointments are after yours.  Also, if you miss three or more appointments without notifying the office, you may be dismissed from the clinic at the provider's discretion.      For prescription refill requests, have your pharmacy contact our office and allow 72 hours for refills to be completed.        To help prevent nausea and vomiting after your treatment, we encourage you to take your nausea medication as directed.  BELOW ARE SYMPTOMS THAT SHOULD BE REPORTED IMMEDIATELY: *FEVER GREATER THAN 100.4 F (38 C) OR HIGHER *CHILLS OR SWEATING *NAUSEA AND VOMITING THAT IS NOT CONTROLLED WITH YOUR NAUSEA MEDICATION *UNUSUAL SHORTNESS OF BREATH *UNUSUAL BRUISING OR BLEEDING *URINARY PROBLEMS (pain or burning when urinating, or frequent urination) *BOWEL PROBLEMS (unusual diarrhea, constipation, pain near the anus) TENDERNESS IN MOUTH AND THROAT WITH OR WITHOUT PRESENCE OF ULCERS (sore throat, sores in mouth, or a toothache) UNUSUAL RASH, SWELLING OR PAIN  UNUSUAL VAGINAL DISCHARGE OR ITCHING   Items with * indicate a potential emergency and should be followed up as soon as possible or go to the Emergency Department if any problems should occur.  Please show the CHEMOTHERAPY ALERT CARD or IMMUNOTHERAPY ALERT CARD at check-in to the Emergency Department and triage nurse.  Should you have questions after your visit or need to cancel  or reschedule your appointment, please contact [facility] [phone]  and follow the prompts.  Office hours are 8:00 a.m. to 4:30 p.m. Monday - Friday. Please note that voicemails left after 4:00 p.m. may not be returned until the following business day.  We are closed weekends and major holidays. You have access to a nurse at all times for urgent questions. Please call the main number to the clinic [phone] and follow the prompts.  For any non-urgent questions, you may also contact your provider using MyChart. We now offer e-Visits for anyone 18 and older to request care online for non-urgent symptoms. For details visit [email or web address].   Also download the MyChart app! Go to the app store, search "MyChart", open the app, select , and log in with your MyChart username and password.  Due to Covid, a mask is required upon entering the hospital/clinic. If you do not have a mask, one will be given to you upon arrival. For doctor visits, patients may have 1 support person aged 18 or older with them. For treatment visits, patients cannot have anyone with them due to current Covid guidelines and our immunocompromised population.  

## 2021-07-21 ENCOUNTER — Inpatient Hospital Stay (HOSPITAL_COMMUNITY): Payer: Medicare Other

## 2021-07-21 ENCOUNTER — Encounter (HOSPITAL_COMMUNITY): Payer: Self-pay

## 2021-07-21 VITALS — BP 131/79 | HR 65 | Temp 97.1°F | Resp 18

## 2021-07-21 DIAGNOSIS — Z5111 Encounter for antineoplastic chemotherapy: Secondary | ICD-10-CM | POA: Diagnosis not present

## 2021-07-21 MED ORDER — SODIUM CHLORIDE 0.9 % IV SOLN: Freq: Once | INTRAVENOUS | Status: AC

## 2021-07-21 MED ORDER — SODIUM CHLORIDE 0.9% FLUSH
10.0000 mL | INTRAVENOUS | Status: DC | PRN
  Administered 2021-07-21 (×2): 10 mL

## 2021-07-21 MED ORDER — HEPARIN SOD (PORK) LOCK FLUSH 100 UNIT/ML IV SOLN
500.0000 [IU] | Freq: Once | INTRAVENOUS | Status: AC | PRN
  Administered 2021-07-21: 500 [IU]

## 2021-07-21 MED ORDER — SODIUM CHLORIDE 0.9 % IV SOLN
15.0000 mg/m2 | Freq: Once | INTRAVENOUS | Status: AC
  Administered 2021-07-21: 35 mg via INTRAVENOUS
  Filled 2021-07-21: qty 7

## 2021-07-21 MED ORDER — ONDANSETRON HCL 4 MG/2ML IJ SOLN
4.0000 mg | Freq: Once | INTRAMUSCULAR | Status: AC
  Administered 2021-07-21: 4 mg via INTRAVENOUS
  Filled 2021-07-21: qty 2

## 2021-07-21 NOTE — Progress Notes (Signed)
Patient tolerated therapy with no complaints voiced.  Side effects with management reviewed with understanding verbalized.  Port site clean and dry with no bruising or swelling noted at site.  Good blood return noted before and after administration of therapy.  Band aid applied.  Patient left in satisfactory condition with VSS and no s/s of distress noted.  

## 2021-07-21 NOTE — Patient Instructions (Signed)
[facility]  Discharge Instructions: ?Thank you for choosing [facility] to provide your oncology and hematology care.  ?If you have a lab appointment with the [facility], please come in thru the Main Entrance and check in at the main information desk. ? ?Wear comfortable clothing and clothing appropriate for easy access to any Portacath or PICC line.  ? ?We strive to give you quality time with your provider. You may need to reschedule your appointment if you arrive late (15 or more minutes).  Arriving late affects you and other patients whose appointments are after yours.  Also, if you miss three or more appointments without notifying the office, you may be dismissed from the clinic at the provider?s discretion.    ?  ?For prescription refill requests, have your pharmacy contact our office and allow 72 hours for refills to be completed.   ? ?Today you received the following chemotherapy and/or immunotherapy agents decitabine.  ?  ?To help prevent nausea and vomiting after your treatment, we encourage you to take your nausea medication as directed. ? ?BELOW ARE SYMPTOMS THAT SHOULD BE REPORTED IMMEDIATELY: ?*FEVER GREATER THAN 100.4 F (38 ?C) OR HIGHER ?*CHILLS OR SWEATING ?*NAUSEA AND VOMITING THAT IS NOT CONTROLLED WITH YOUR NAUSEA MEDICATION ?*UNUSUAL SHORTNESS OF BREATH ?*UNUSUAL BRUISING OR BLEEDING ?*URINARY PROBLEMS (pain or burning when urinating, or frequent urination) ?*BOWEL PROBLEMS (unusual diarrhea, constipation, pain near the anus) ?TENDERNESS IN MOUTH AND THROAT WITH OR WITHOUT PRESENCE OF ULCERS (sore throat, sores in mouth, or a toothache) ?UNUSUAL RASH, SWELLING OR PAIN  ?UNUSUAL VAGINAL DISCHARGE OR ITCHING  ? ?Items with * indicate a potential emergency and should be followed up as soon as possible or go to the Emergency Department if any problems should occur. ? ?Please show the CHEMOTHERAPY ALERT CARD or IMMUNOTHERAPY ALERT CARD at check-in to the Emergency  Department and triage nurse. ? ?Should you have questions after your visit or need to cancel or reschedule your appointment, please contact [facility] [phone]  and follow the prompts.  Office hours are 8:00 a.m. to 4:30 p.m. Monday - Friday. Please note that voicemails left after 4:00 p.m. may not be returned until the following business day.  We are closed weekends and major holidays. You have access to a nurse at all times for urgent questions. Please call the main number to the clinic [phone] and follow the prompts. ? ?For any non-urgent questions, you may also contact your provider using MyChart. We now offer e-Visits for anyone 79 and older to request care online for non-urgent symptoms. For details visit [email or web address]. ?  ?Also download the MyChart app! Go to the app store, search "MyChart", open the app, select , and log in with your MyChart username and password. ? ?Due to Covid, a mask is required upon entering the hospital/clinic. If you do not have a mask, one will be given to you upon arrival. For doctor visits, patients may have 1 support person aged 62 or older with them. For treatment visits, patients cannot have anyone with them due to current Covid guidelines and our immunocompromised population.  ?

## 2021-07-22 ENCOUNTER — Other Ambulatory Visit: Payer: Self-pay

## 2021-07-22 ENCOUNTER — Inpatient Hospital Stay (HOSPITAL_COMMUNITY): Payer: Medicare Other

## 2021-07-22 VITALS — BP 132/76 | HR 68 | Temp 97.6°F | Resp 18

## 2021-07-22 DIAGNOSIS — C9200 Acute myeloblastic leukemia, not having achieved remission: Secondary | ICD-10-CM

## 2021-07-22 DIAGNOSIS — Z5111 Encounter for antineoplastic chemotherapy: Secondary | ICD-10-CM | POA: Diagnosis not present

## 2021-07-22 DIAGNOSIS — Z95828 Presence of other vascular implants and grafts: Secondary | ICD-10-CM

## 2021-07-22 DIAGNOSIS — D46Z Other myelodysplastic syndromes: Secondary | ICD-10-CM

## 2021-07-22 MED ORDER — ONDANSETRON HCL 4 MG/2ML IJ SOLN
4.0000 mg | Freq: Once | INTRAMUSCULAR | Status: AC
  Administered 2021-07-22: 4 mg via INTRAVENOUS
  Filled 2021-07-22: qty 2

## 2021-07-22 MED ORDER — HEPARIN SOD (PORK) LOCK FLUSH 100 UNIT/ML IV SOLN
500.0000 [IU] | Freq: Once | INTRAVENOUS | Status: AC | PRN
  Administered 2021-07-22: 500 [IU]

## 2021-07-22 MED ORDER — SODIUM CHLORIDE 0.9 % IV SOLN: Freq: Once | INTRAVENOUS | Status: AC

## 2021-07-22 MED ORDER — SODIUM CHLORIDE 0.9% FLUSH
10.0000 mL | INTRAVENOUS | Status: DC | PRN
  Administered 2021-07-22: 10 mL

## 2021-07-22 MED ORDER — SODIUM CHLORIDE 0.9 % IV SOLN
15.0000 mg/m2 | Freq: Once | INTRAVENOUS | Status: AC
  Administered 2021-07-22: 35 mg via INTRAVENOUS
  Filled 2021-07-22: qty 7

## 2021-07-22 NOTE — Progress Notes (Signed)
Chaplain engaged in an initial visit with Richard Wallace who shared about his healthcare journey and cancer, as well as his struggle with arthritis.  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  He shared that he was going to have his knees replaced about two years ago and they discovered he had cancer through routine blood work.  Chaplain asked Richard Wallace about what he does to care for his arthritis which includes oral pain medicine, pain patches, and knee pads.   ? ?Chaplain uplifted the ways Richard Wallace has endured pain that can't easily be seen outwardly.  Chaplain offered support, a compassionate presence and reflective listening. ? ? ? 07/22/21 1100  ?Clinical Encounter Type  ?Visited With Patient  ?Visit Type Initial;Spiritual support  ? ? ?

## 2021-07-22 NOTE — Progress Notes (Signed)
Patient presents for day 5 Decitabine per providers order.  Vital signs within parameters for treatment.  Patient has no new complaints at this time. ? ?Decitabine given today per MD orders.  Stable during infusion without adverse affects.  Vital signs stable.  No complaints at this time.  Discharge from clinic ambulatory in stable condition.  Alert and oriented X 3.  Follow up with [facility] as scheduled.  ?

## 2021-07-25 ENCOUNTER — Other Ambulatory Visit (HOSPITAL_COMMUNITY): Payer: Self-pay

## 2021-08-12 IMAGING — CR DG CHEST 1V PORT
1 series · 1 of 1 positions shown · non-contrast
Comparison: 08/28/2018

CLINICAL DATA: Fever and cough. Patient has leukemia receiving
chemotherapy.

EXAM:
PORTABLE CHEST 1 VIEW

[portable]
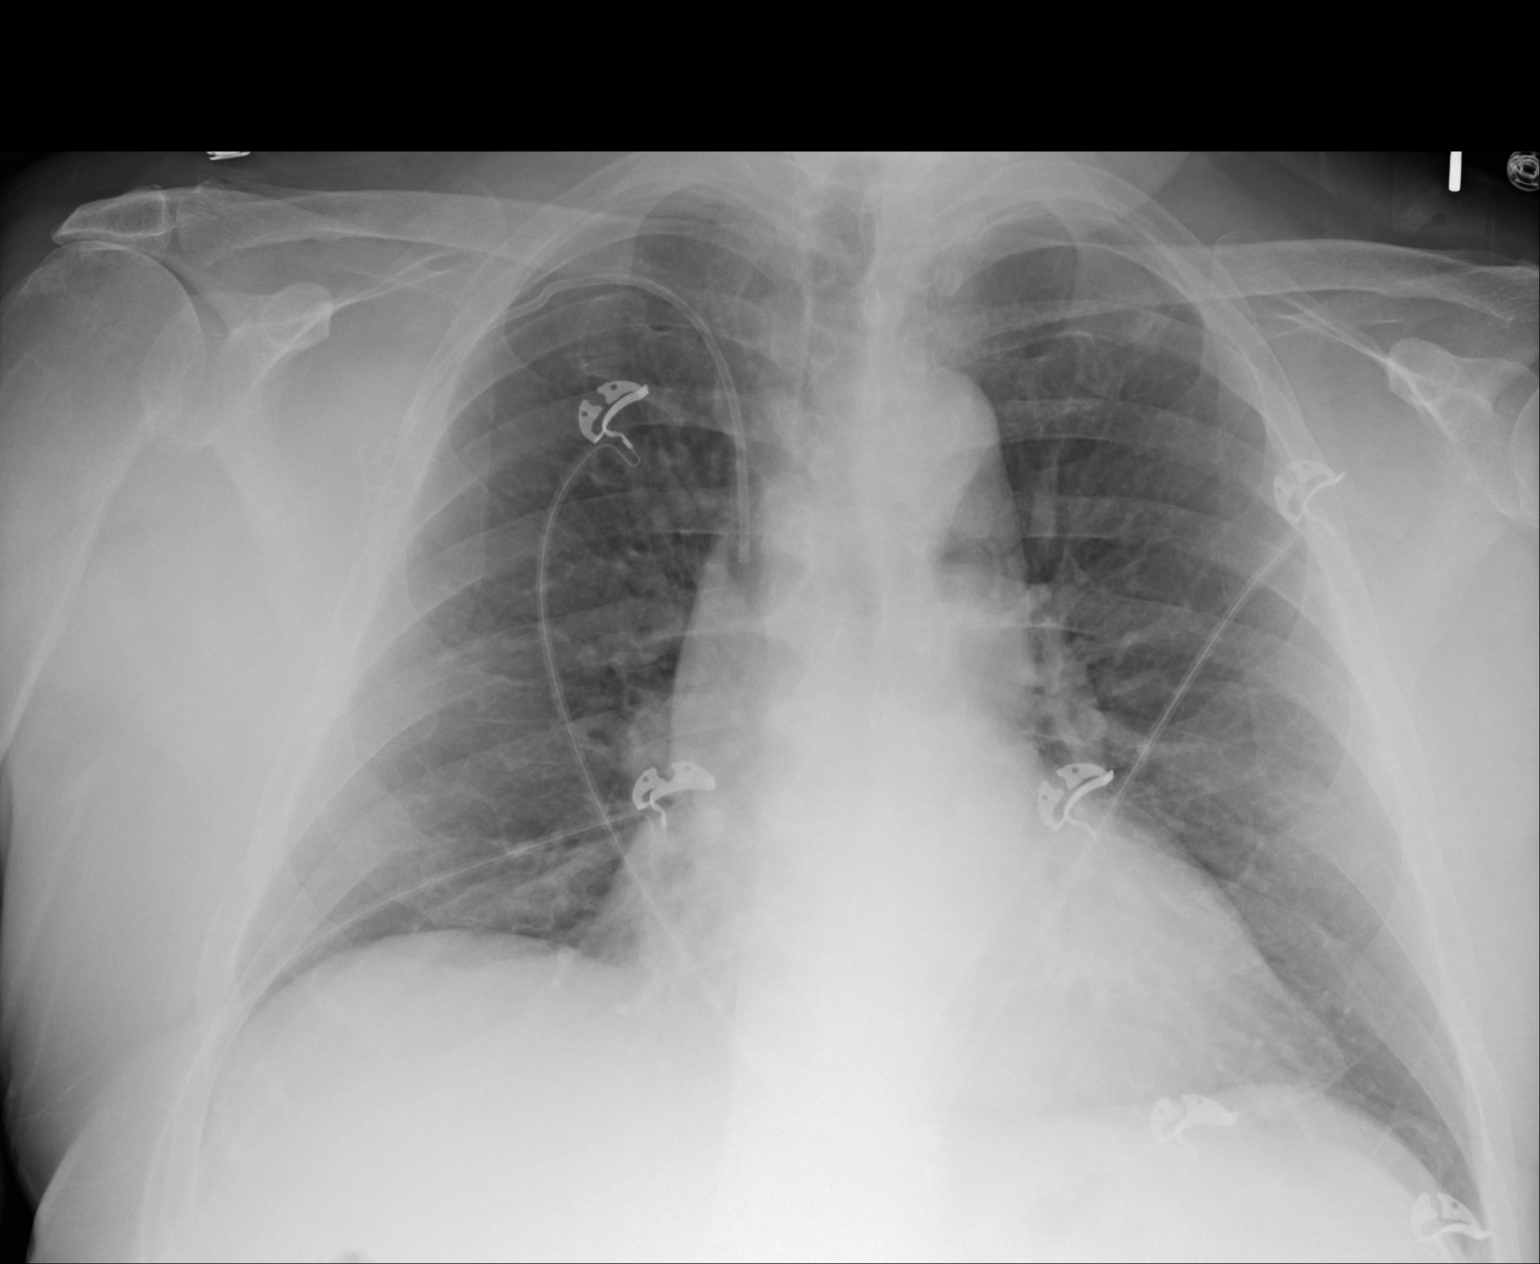

[1 of 1 positions shown; findings below may reference images not displayed]

FINDINGS: Right subclavian Port-A-Cath unchanged. Lungs are adequately
inflated without focal airspace consolidation or effusion.
Cardiomediastinal silhouette and remainder the exam is unchanged.
IMPRESSION: No active disease.

## 2021-08-15 IMAGING — CT CT CHEST W/ CM
2 of 5 series · 12 of 36 positions shown, 15 images · IV contrast (omnipaque)
Comparison: CT urogram 09/29/2014.

CLINICAL DATA: Fever of unknown origin. Cough with loose stools.
AML.

EXAM:
CT CHEST, ABDOMEN, AND PELVIS WITH CONTRAST
TECHNIQUE: Multidetector CT imaging of the chest, abdomen and pelvis was
performed following the standard protocol during bolus
administration of intravenous contrast.
CONTRAST:  100mL OMNIPAQUE IOHEXOL 300 MG/ML SOLN, 30mL OMNIPAQUE
IOHEXOL 300 MG/ML SOLN

[Series 2: cap with · axial · 0.83mm/px · z∈[-500,+40]mm · 9 of 133 slices shown, 12 images]
[im 13/133  mediastinal]
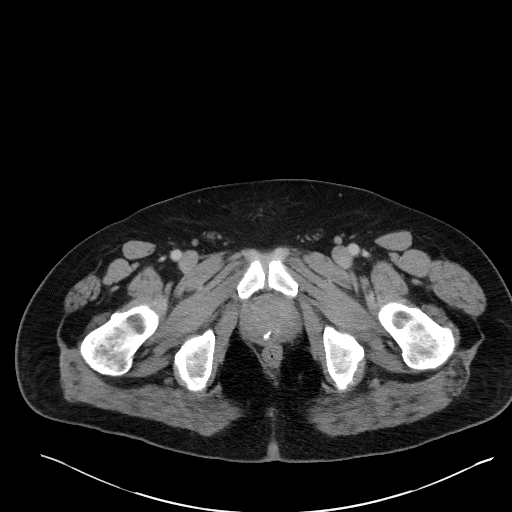
[im 13/133  lung]
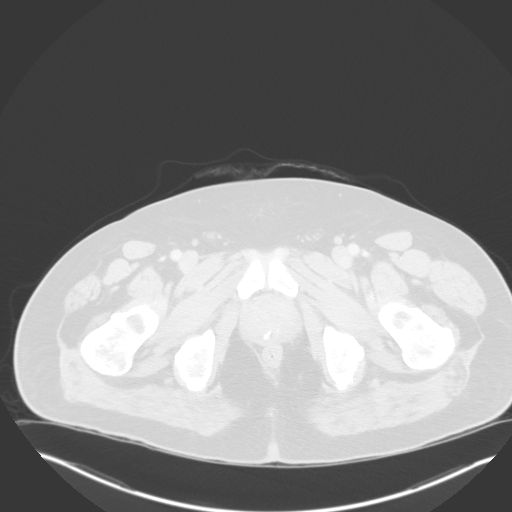
[im 25/133  lung]
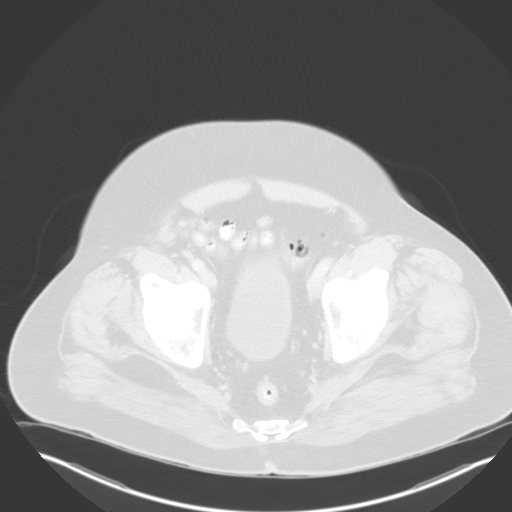
[im 37/133  lung]
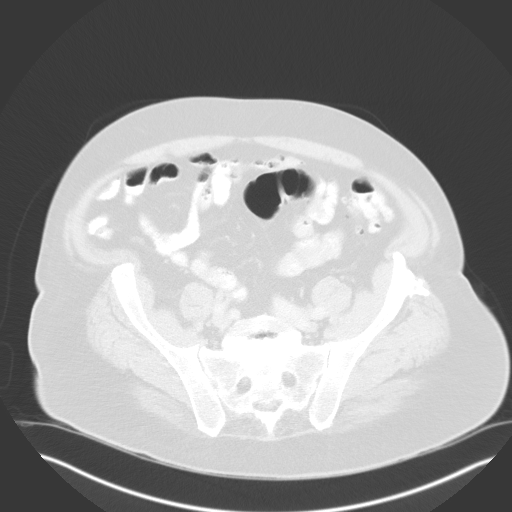
[im 49/133  lung]
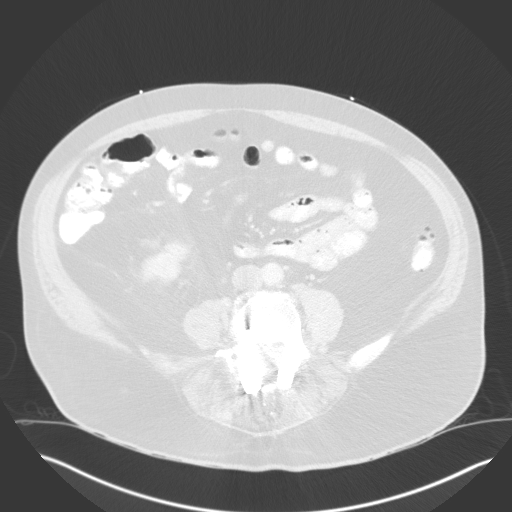
[im 73/133  mediastinal]
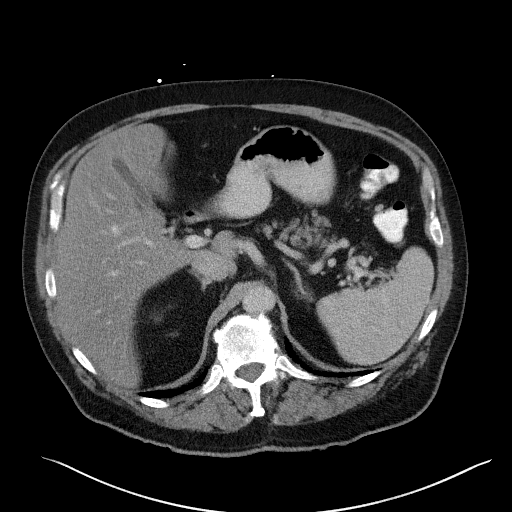
[im 73/133  lung]
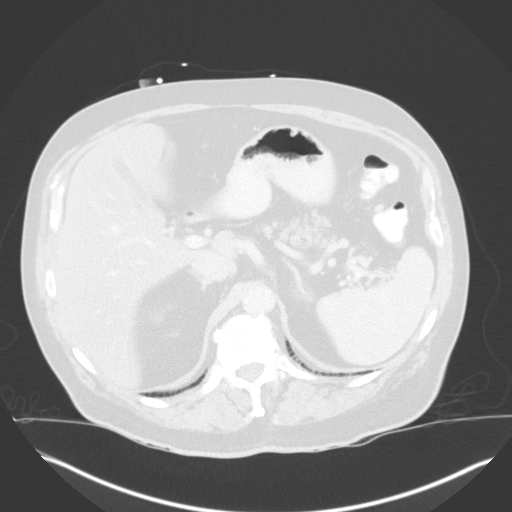
[im 85/133  lung]
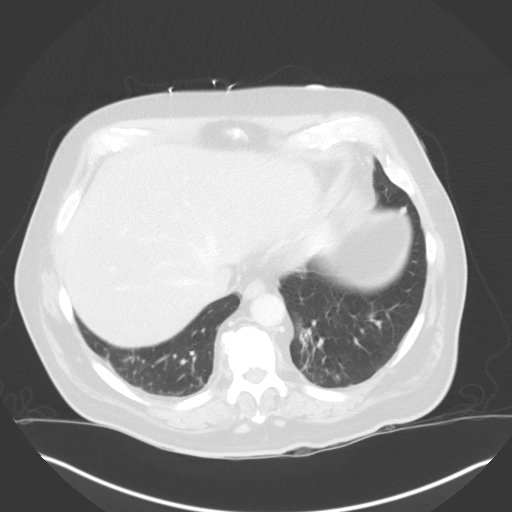
[im 97/133  lung]
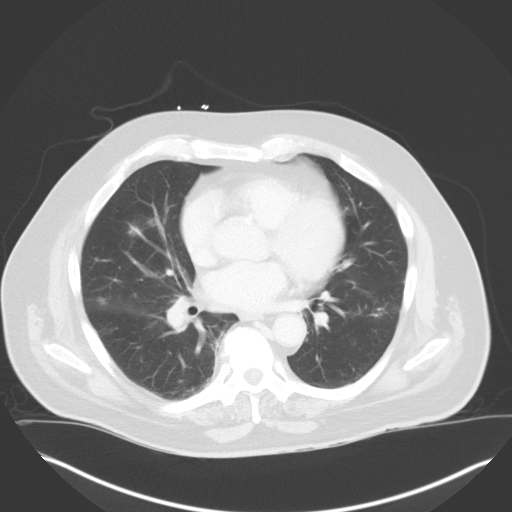
[im 109/133  lung]
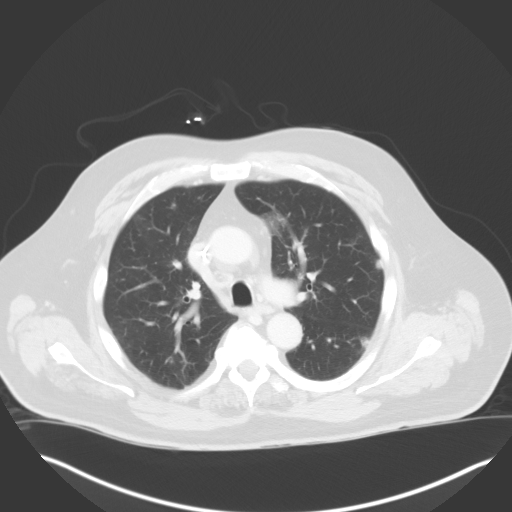
[im 121/133  mediastinal]
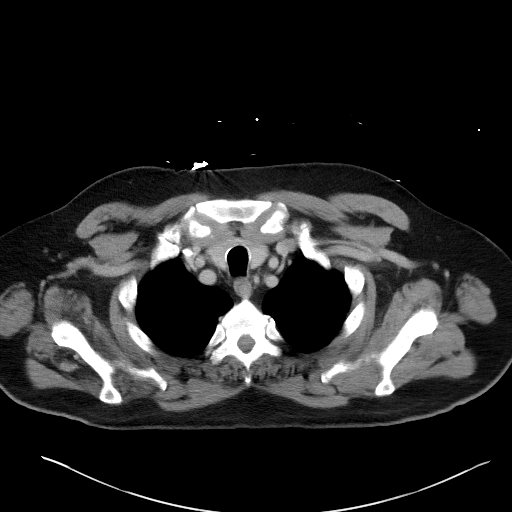
[im 121/133  lung]
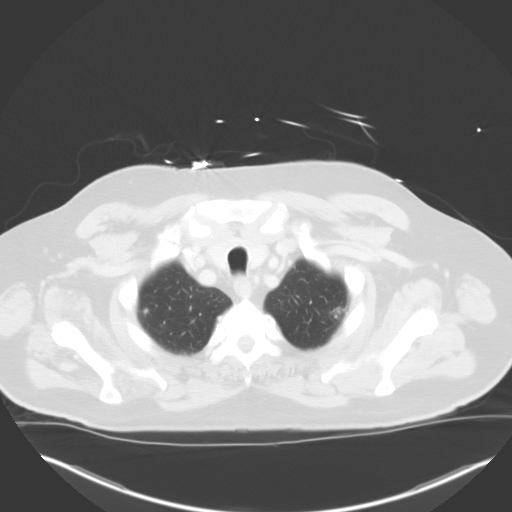

[Series 6: coronals · coronal · 0.73mm/px · 3 of 156 slices shown]
[im 32/156  lung]
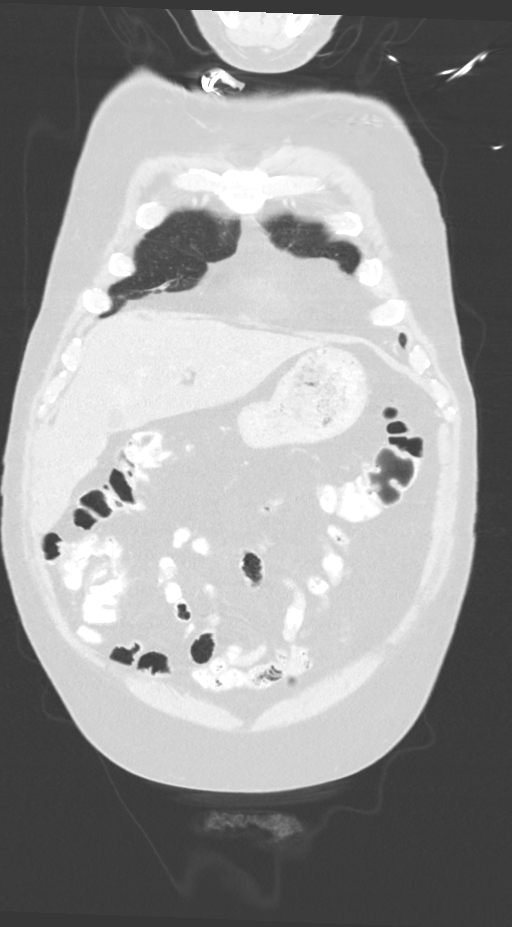
[im 63/156  lung]
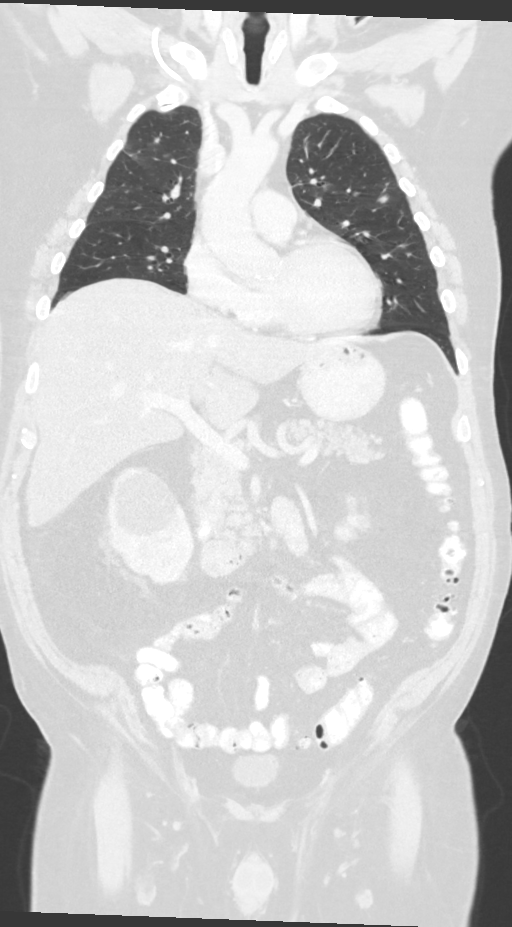
[im 94/156  lung]
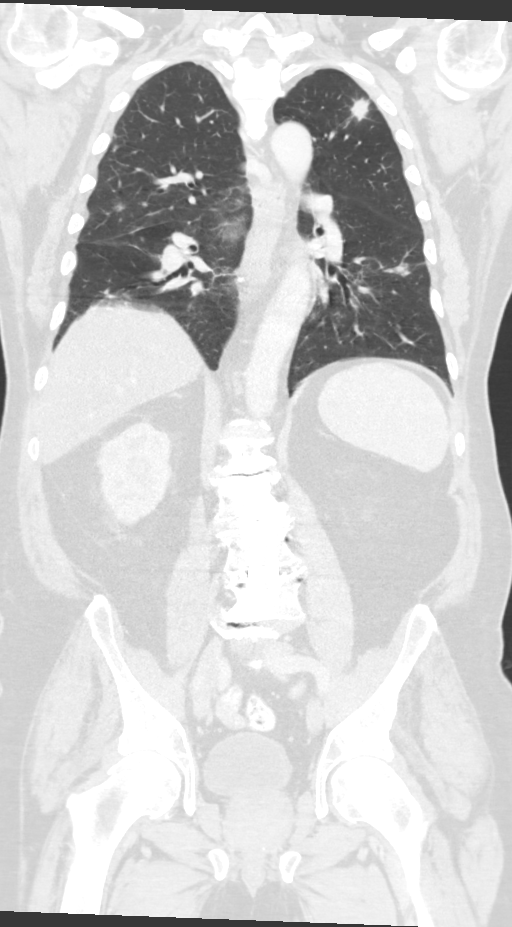

[12 of 36 positions shown; findings below may reference images not displayed]

FINDINGS: CT CHEST FINDINGS

Cardiovascular: The heart size is normal. No substantial pericardial
effusion. No thoracic aortic aneurysm. Right Port-A-Cath tip is
positioned in the mid SVC.

Mediastinum/Nodes: No mediastinal lymphadenopathy. There is no hilar
lymphadenopathy. The esophagus has normal imaging features. There is
no axillary lymphadenopathy.

Lungs/Pleura: Numerous irregular bilateral pulmonary nodules are
identified. One of the more dominant nodules is seen in the left
upper lobe on 35/3, measuring 1.4 cm diameter. Index nodule right
upper lobe on 75/3 measures 8 mm. Left lower lobe index nodule on
94/3 is 10 mm. No focal airspace consolidation. No pleural effusion.

Musculoskeletal: No worrisome lytic or sclerotic osseous
abnormality.

CT ABDOMEN PELVIS FINDINGS

Hepatobiliary: The liver shows diffusely decreased attenuation
suggesting fat deposition. No suspicious focal abnormality within
the liver parenchyma. Gallbladder is nondistended. No intrahepatic
or extrahepatic biliary dilation.

Pancreas: No focal mass lesion. No dilatation of the main duct. No
intraparenchymal cyst. No peripancreatic edema.

Spleen: No splenomegaly. No focal mass lesion.

Adrenals/Urinary Tract: No adrenal nodule or mass. Left kidney is
markedly atrophic with 3.6 cm interpolar cystic lesion similar to
prior study. 5.7 cm cystic lesion anterior interpolar right kidney
was 5.4 cm previously. 14 mm low-density lesion upper interpolar
right kidney (69/2) was present previously and is not substantially
changed. Central sinus cysts are also noted in the right kidney. 8
mm nonobstructing stone identified lower pole right kidney. No
evidence for hydroureter.

Mild but asymmetric left bladder wall thickening evident (image
112/2. This was probably present on the previous study but less
conspicuous.

Stomach/Bowel: Stomach is unremarkable. No gastric wall thickening.
No evidence of outlet obstruction. Duodenum is normally positioned
as is the ligament of Treitz. Large duodenal diverticulum evident.
No small bowel wall thickening. No small bowel dilatation. The
terminal ileum is normal. The appendix is normal. No gross colonic
mass. No colonic wall thickening. Diverticuli are seen scattered
along the entire length of the colon without CT findings of
diverticulitis.

Vascular/Lymphatic: There is abdominal aortic atherosclerosis
without aneurysm. There is no gastrohepatic or hepatoduodenal
ligament lymphadenopathy. No intraperitoneal or retroperitoneal
lymphadenopathy. No pelvic sidewall lymphadenopathy.

Reproductive: Prostate gland is enlarged.

Other: No intraperitoneal free fluid.

Musculoskeletal: No worrisome lytic or sclerotic osseous
abnormality. Status post lumbar fusion.
IMPRESSION: 1. Numerous bilateral irregular/spiculated pulmonary nodules
measuring up to 14 mm. Imaging features are nonspecific and could be
related to an infectious etiology or neoplasm.
2. Mild asymmetric left bladder wall thickening, likely present on
the study from over 4 years ago although less conspicuous on that
exam due to lack of intravenous contrast administration.
3. Hepatic steatosis.
4. Bilateral renal cysts with 8 mm nonobstructing right renal stone.
5.  Aortic Atherosclerois (QSOJC-170.0)

## 2021-08-22 ENCOUNTER — Other Ambulatory Visit (HOSPITAL_COMMUNITY): Payer: Self-pay

## 2021-08-24 ENCOUNTER — Other Ambulatory Visit (HOSPITAL_COMMUNITY): Payer: Self-pay

## 2021-08-24 MED ORDER — HYDROCODONE-ACETAMINOPHEN 10-325 MG PO TABS: 1.0000 | ORAL_TABLET | Freq: Four times a day (QID) | ORAL | 0 refills | Status: DC | PRN

## 2021-08-29 ENCOUNTER — Inpatient Hospital Stay (HOSPITAL_COMMUNITY): Payer: Medicare Other

## 2021-08-29 ENCOUNTER — Inpatient Hospital Stay (HOSPITAL_COMMUNITY): Payer: Medicare Other | Attending: Hematology

## 2021-08-29 ENCOUNTER — Inpatient Hospital Stay (HOSPITAL_BASED_OUTPATIENT_CLINIC_OR_DEPARTMENT_OTHER): Payer: Medicare Other | Admitting: Hematology

## 2021-08-29 VITALS — BP 166/91 | HR 69 | Temp 97.3°F | Resp 18 | Ht 69.5 in | Wt 223.8 lb

## 2021-08-29 DIAGNOSIS — C9200 Acute myeloblastic leukemia, not having achieved remission: Secondary | ICD-10-CM | POA: Insufficient documentation

## 2021-08-29 DIAGNOSIS — M2550 Pain in unspecified joint: Secondary | ICD-10-CM | POA: Insufficient documentation

## 2021-08-29 DIAGNOSIS — Z79899 Other long term (current) drug therapy: Secondary | ICD-10-CM | POA: Insufficient documentation

## 2021-08-29 DIAGNOSIS — F0390 Unspecified dementia without behavioral disturbance: Secondary | ICD-10-CM | POA: Insufficient documentation

## 2021-08-29 DIAGNOSIS — I10 Essential (primary) hypertension: Secondary | ICD-10-CM | POA: Insufficient documentation

## 2021-08-29 DIAGNOSIS — Z87891 Personal history of nicotine dependence: Secondary | ICD-10-CM | POA: Insufficient documentation

## 2021-08-29 DIAGNOSIS — D46Z Other myelodysplastic syndromes: Secondary | ICD-10-CM

## 2021-08-29 LAB — COMPREHENSIVE METABOLIC PANEL
ALT: 48 U/L — ABNORMAL HIGH (ref 0–44)
AST: 29 U/L (ref 15–41)
Albumin: 4 g/dL (ref 3.5–5.0)
Alkaline Phosphatase: 64 U/L (ref 38–126)
Anion gap: 6 (ref 5–15)
BUN: 27 mg/dL — ABNORMAL HIGH (ref 8–23)
CO2: 27 mmol/L (ref 22–32)
Calcium: 9.2 mg/dL (ref 8.9–10.3)
Chloride: 108 mmol/L (ref 98–111)
Creatinine, Ser: 0.85 mg/dL (ref 0.61–1.24)
GFR, Estimated: >60 mL/min (ref >60)
Glucose, Bld: 96 mg/dL (ref 70–99)
Potassium: 4.3 mmol/L (ref 3.5–5.1)
Sodium: 141 mmol/L (ref 135–145)
Total Bilirubin: 0.4 mg/dL (ref 0.3–1.2)
Total Protein: 6.6 g/dL (ref 6.5–8.1)

## 2021-08-29 LAB — CBC WITH DIFFERENTIAL/PLATELET
Abs Immature Granulocytes: 0.73 10*3/uL — ABNORMAL HIGH (ref 0.00–0.07)
Basophils Absolute: 0 10*3/uL (ref 0.0–0.1)
Basophils Relative: 0 %
Eosinophils Absolute: 0.1 10*3/uL (ref 0.0–0.5)
Eosinophils Relative: 1 %
HCT: 49.5 % (ref 39.0–52.0)
Hemoglobin: 16.3 g/dL (ref 13.0–17.0)
Immature Granulocytes: 7 %
Lymphocytes Relative: 13 %
Lymphs Abs: 1.3 10*3/uL (ref 0.7–4.0)
MCH: 31.2 pg (ref 26.0–34.0)
MCHC: 32.9 g/dL (ref 30.0–36.0)
MCV: 94.8 fL (ref 80.0–100.0)
Monocytes Absolute: 0.7 10*3/uL (ref 0.1–1.0)
Monocytes Relative: 7 %
Neutro Abs: 7.6 10*3/uL (ref 1.7–7.7)
Neutrophils Relative %: 72 %
Platelets: 138 10*3/uL — ABNORMAL LOW (ref 150–400)
RBC: 5.22 MIL/uL (ref 4.22–5.81)
RDW: 16 % — ABNORMAL HIGH (ref 11.5–15.5)
WBC: 10.4 10*3/uL (ref 4.0–10.5)
nRBC: 0 % (ref 0.0–0.2)

## 2021-08-29 LAB — MAGNESIUM: Magnesium: 2 mg/dL (ref 1.7–2.4)

## 2021-08-29 LAB — LACTATE DEHYDROGENASE: LDH: 182 U/L (ref 98–192)

## 2021-08-29 MED ORDER — METHYLPREDNISOLONE 4 MG PO TBPK
ORAL_TABLET | ORAL | 0 refills | Status: DC
Start: 2021-08-29 — End: 2021-08-29

## 2021-08-29 NOTE — Progress Notes (Signed)
Patient not being treated today per Dr. Delton Coombes. ? ?Will come back next week for possible treatment. ?

## 2021-08-29 NOTE — Patient Instructions (Signed)
[place] at [facility] ?Discharge Instructions ? ?You were seen and examined today by Dr. Delton Coombes. ? ?Dr. Delton Coombes discussed your most recent lab work, which is good. ? ?Follow-up with Dr. Berenice Primas related to your pain. ? ?Follow-up as scheduled. ? ? ? ?Thank you for choosing [place] at [facility] to provide your oncology and hematology care.  To afford each patient quality time with our provider, please arrive at least 15 minutes before your scheduled appointment time.  ? ?If you have a lab appointment with the [facility] please come in thru the Main Entrance and check in at the main information desk. ? ?You need to re-schedule your appointment should you arrive 10 or more minutes late.  We strive to give you quality time with our providers, and arriving late affects you and other patients whose appointments are after yours.  Also, if you no show three or more times for appointments you may be dismissed from the clinic at the providers discretion.     ?Again, thank you for choosing [facility].  Our hope is that these requests will decrease the amount of time that you wait before being seen by our physicians.       ?_____________________________________________________________ ? ?Should you have questions after your visit to [facility], please contact our office at [phone] and follow the prompts.  Our office hours are 8:00 a.m. and 4:30 p.m. Monday - Friday.  Please note that voicemails left after 4:00 p.m. may not be returned until the following business day.  We are closed weekends and major holidays.  You do have access to a nurse 24-7, just call the main number to the clinic [phone] and do not press any options, hold on the line and a nurse will answer the phone.   ? ?For prescription refill requests, have your pharmacy contact our office and allow 72 hours.   ? ?Due to Covid, you will need to wear a mask upon  entering the hospital. If you do not have a mask, a mask will be given to you at the Main Entrance upon arrival. For doctor visits, patients may have 1 support person age 44 or older with them. For treatment visits, patients can not have anyone with them due to social distancing guidelines and our immunocompromised population.  ? ? ? ?

## 2021-08-29 NOTE — Progress Notes (Signed)
? ?Richard Wallace ?[address] ?[place], [place] [address] ? ? ?CLINIC:  ?Medical Oncology/Hematology ? ?PCP:  ?[name], MD ?[address] [place] [address] ?[phone] ? ? ?REASON FOR VISIT:  ?Follow-up for AML ? ?PRIOR THERAPY: none ? ?NGS Results: not done ? ?CURRENT THERAPY: Decitabine every 6 weeks & venetoclax 200 mg x2 weeks after chemo ? 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 ?  ?08/22/2018 - 12/06/2018 Chemotherapy  ? The patient had palonosetron (ALOXI) injection 0.25 mg, 0.25 mg, Intravenous,  Once, 4 of 6 cycles ?Administration: 0.25 mg (08/22/2018), 0.25 mg (08/26/2018), 0.25 mg (08/28/2018), 0.25 mg (08/30/2018), 0.25 mg (10/01/2018), 0.25 mg (10/02/2018), 0.25 mg (11/04/2018), 0.25 mg (09/23/2018), 0.25 mg (09/25/2018), 0.25 mg (09/27/2018), 0.25 mg (10/28/2018), 0.25 mg (10/30/2018), 0.25 mg (11/01/2018), 0.25 mg (12/02/2018), 0.25 mg (12/04/2018), 0.25 mg (12/06/2018) ?azaCITIDine (VIDAZA) 100 mg in sodium chloride 0.9 % 50 mL chemo infusion, 110 mg (66.7 % of original dose 75 mg/m2), Intravenous, Once, 4 of 6 cycles ?Dose modification: 50 mg/m2 (original dose 75 mg/m2, Cycle 1, Reason: Provider Judgment), 50 mg/m2 (original dose 75 mg/m2, Cycle 2, Reason: Provider Judgment) ?Administration: 100 mg (08/22/2018), 100 mg (08/23/2018), 100 mg (08/26/2018), 100 mg (08/27/2018), 100 mg (08/28/2018), 100 mg (08/29/2018), 100 mg (08/30/2018), 100 mg (10/01/2018), 100 mg (10/02/2018), 100 mg (11/04/2018), 100 mg (11/05/2018), 100 mg (09/23/2018), 100 mg (09/24/2018), 100 mg (09/25/2018), 100 mg (09/26/2018), 100 mg (09/27/2018), 100 mg (10/28/2018), 100 mg (10/29/2018), 100 mg (10/30/2018), 100 mg (10/31/2018), 100 mg (11/01/2018), 100 mg (12/02/2018), 100 mg (12/03/2018), 100 mg (12/04/2018), 100 mg (12/05/2018), 100 mg (12/06/2018) ? ? for chemotherapy treatment.  ? ?  ?01/20/2019 -   Chemotherapy  ? Patient is on Treatment Plan : MYELODYSPLASIA Decitabine D1-5 q42d  ? ?  ?  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 ?  ?01/20/2019 -  Chemotherapy  ? Patient is on Treatment Plan : MYELODYSPLASIA Decitabine D1-5 q42d  ? ?  ?  ? ? ?CANCER STAGING: ? Cancer Staging  ?No matching staging information was found for the patient. ? ?INTERVAL HISTORY:  ?Richard Wallace, a 76 y.o. male, returns for routine follow-up and consideration for next cycle of chemotherapy. Richard Wallace was last seen on 07/18/2021. ? ?Due for cycle #23 of Decitabine today.  ? ?Overall, he tells me he has been feeling pretty well. He reports pain in his arms and hands bilaterally since 4/18 following catching a falling heavy board which has not been helped by hydrocodone every 6 hours. He denies recent infections and cough.  ? ?Overall, he does not feel ready for next cycle of chemo today.  ? ?REVIEW OF SYSTEMS:  ?Review of Systems  ?Constitutional:  Positive for fatigue. Negative for appetite change.  ?Respiratory:  Negative for cough.   ?Musculoskeletal:  Positive for arthralgias (8/10 arms).  ?All other systems reviewed and are negative. ? ?PAST MEDICAL/SURGICAL HISTORY:  ?Past Medical History:  ?Diagnosis Date  ? Arthritis   ? Atrophy of left kidney   ? only 7.8% functioning  ? Cancer [facility]) 01-28-2014  ? skin cancer  ? CKD (chronic kidney disease), stage III ([place])   ? GERD (gastroesophageal reflux disease)   ? Heart murmur   ? NOTED DURING PHYSICAL WHEN HE WAS ENLISTING IN [place] , DIDNT KNOW UNTIL THAT TIME  AND REPORTS , "THATS THE LAST I HEARD ABOUT IT "   ? History of hypertension   ? no longer issue  ? History of kidney stones   ? History of malignant melanoma of skin   ? excision top of scalp 2015-- no recurrence  ? History of urinary retention   ? post op lumbar fusion surgery 04/ 2016  ? Hypertension   ? Kidney dysfunction   ? 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 Left ureteral calculus   ? Seasonal allergies   ? Wears glasses   ? Wears glasses   ? Wears partial dentures   ? upper and lower  ? ?Past Surgical History:  ?Procedure Laterality Date  ? ANKLE FUSION Right 2007  ? CARPAL TUNNEL RELEASE Left 12/28/2009  ? w/ pulley release left long finger  ? CARPAL TUNNEL RELEASE Right 07/22/2013  ? Procedure: RIGHT CARPAL TUNNEL RELEASE;  Surgeon: Cammie Sickle., MD;  Location: [facility];  Service: Orthopedics;  Laterality: Right;  ? COLONOSCOPY    ? CYSTO/  LEFT RETROGRADE PYELOGRAM  11/21/2010  ? CYSTOSCOPY WITH STENT PLACEMENT Left 03/09/2016  ? Procedure: CYSTOSCOPY WITH STENT PLACEMENT;  Surgeon: Franchot Gallo, MD;  Location: [facility];  Service: Urology;  Laterality: Left;  ? CYSTOSCOPY/RETROGRADE/URETEROSCOPY/STONE EXTRACTION WITH BASKET Left 03/09/2016  ? Procedure: CYSTOSCOPY/RETROGRADE/URETEROSCOPY/STONE EXTRACTION WITH BASKET;  Surgeon: Franchot Gallo, MD;  Location: [facility];  Service: Urology;  Laterality: Left;  ? LEFT URETEROSCOPIC LASER LITHOTRIPSY STONE EXTRACTION/ STENT PLACEMENT  05/23/2010  ? MOHS SURGERY    ? TOP OF THE HEAD   ? ORIF ANKLE FRACTURE Right 1978  ? PORT-A-CATH REMOVAL Right 02/14/2019  ? Procedure: MINOR REMOVAL PORT-A-CATH;  Surgeon: Aviva Signs, MD;  Location: [facility];  Service: General;  Laterality: Right;  ? PORTACATH PLACEMENT Right 08/19/2018  ? Procedure: INSERTION PORT-A-CATH (attached catheter in right subclavian);  Surgeon: Aviva Signs, MD;  Location: [facility];  Service: General;  Laterality: Right;  ? PORTACATH PLACEMENT Left 05/16/2019  ? Procedure: INSERTION PORT-A-CATH (attached catheter in left subclavian);  Surgeon: Aviva Signs, MD;  Location: [facility];  Service: General;  Laterality: Left;  ? POSTERIOR LUMBAR FUSION  08/21/2014  ? laminectomy and decompression L2 -- L5  ? RIGHT LOWER LEG SURGERY  X3  1975 to 1976  ?  including ORIF  ? TONSILLECTOMY AND ADENOIDECTOMY  1986  ? UMBILICAL HERNIA REPAIR  2009 approx  ? ? ?SOCIAL HISTORY:  ?Social History  ? ?Socioeconomic History  ? Marital status: Married  ?  Spouse name: Not on file  ? Number of children: Not on file  ? Years of education: Not on file  ? Highest education level: Not on file  ?Occupational History  ? Not on file  ?Tobacco Use  ? Smoking status: Former  ?  Years: 20.00  ?  Types: Cigarettes  ?  Quit date: 07/17/1986  ?  Years since quitting: 35.1  ? Smokeless tobacco: Never  ?Vaping Use  ? Vaping Use: Never used  ?Substance and Sexual Activity  ? Alcohol use: Yes  ?  Alcohol/week: 7.0 - 14.0 standard drinks  ?  Types: 7 - 14 Cans of beer per week  ?  Comment: 1 -2 beer daily  ? Drug use: No  ? Sexual activity: Not Currently  ?Other Topics Concern  ? Not on file  ?Social History Narrative  ? Not on file  ? ?  Social Determinants of Health  ? ?Financial Resource Strain: Not on file  ?Food Insecurity: Not on file  ?Transportation Needs: Not on file  ?Physical Activity: Not on file  ?Stress: Not on file  ?Social Connections: Not on file  ?Intimate Partner Violence: Not on file  ? ? ?FAMILY HISTORY:  ?Family History  ?Problem Relation Age of Onset  ? Stroke Mother   ? Prostate cancer Father   ? Bone cancer Father   ? Diverticulitis Father   ? Rheum arthritis Sister   ? Urinary tract infection Sister   ? Colon cancer Neg Hx   ? ? ?CURRENT MEDICATIONS:  ?Current Outpatient Medications  ?Medication Sig Dispense Refill  ? ACETAMINOPHEN EXTRA STRENGTH 500 MG capsule     ? allopurinol (ZYLOPRIM) 300 MG tablet Take 300 mg by mouth daily.    ? ALPRAZolam (XANAX) 0.25 MG tablet Take 1 tablet by mouth twice daily as needed for anxiety 60 tablet 0  ? amLODipine (NORVASC) 2.5 MG tablet Take 1 tablet (2.5 mg total) by mouth daily. 90 tablet 2  ? cetirizine (ZYRTEC) 10 MG tablet Take 10 mg by mouth daily. IN THE MORNING    ? diclofenac Sodium (VOLTAREN) 1 % GEL Apply three times daily to  knees as needed for pain 50 g 3  ? docusate sodium (COLACE) 100 MG capsule Take 100 mg by mouth at bedtime.     ? donepezil (ARICEPT) 10 MG tablet Take 1 tablet (10 mg total) by mouth at bedtime. 30 tablet 3

## 2021-08-30 ENCOUNTER — Inpatient Hospital Stay (HOSPITAL_COMMUNITY): Payer: Medicare Other

## 2021-08-31 ENCOUNTER — Inpatient Hospital Stay (HOSPITAL_COMMUNITY): Payer: Medicare Other

## 2021-09-01 ENCOUNTER — Inpatient Hospital Stay (HOSPITAL_COMMUNITY): Payer: Medicare Other

## 2021-09-02 ENCOUNTER — Inpatient Hospital Stay (HOSPITAL_COMMUNITY): Payer: Medicare Other

## 2021-09-05 ENCOUNTER — Inpatient Hospital Stay (HOSPITAL_COMMUNITY): Payer: Medicare Other | Attending: Hematology

## 2021-09-05 ENCOUNTER — Encounter (HOSPITAL_COMMUNITY): Payer: Self-pay

## 2021-09-05 ENCOUNTER — Inpatient Hospital Stay (HOSPITAL_COMMUNITY): Payer: Medicare Other

## 2021-09-05 VITALS — BP 139/80 | HR 59 | Temp 97.7°F | Resp 18

## 2021-09-05 DIAGNOSIS — D46Z Other myelodysplastic syndromes: Secondary | ICD-10-CM

## 2021-09-05 DIAGNOSIS — Z95828 Presence of other vascular implants and grafts: Secondary | ICD-10-CM

## 2021-09-05 DIAGNOSIS — Z5111 Encounter for antineoplastic chemotherapy: Secondary | ICD-10-CM | POA: Diagnosis not present

## 2021-09-05 DIAGNOSIS — Z79899 Other long term (current) drug therapy: Secondary | ICD-10-CM | POA: Diagnosis not present

## 2021-09-05 DIAGNOSIS — C9200 Acute myeloblastic leukemia, not having achieved remission: Secondary | ICD-10-CM | POA: Insufficient documentation

## 2021-09-05 DIAGNOSIS — I10 Essential (primary) hypertension: Secondary | ICD-10-CM | POA: Diagnosis not present

## 2021-09-05 LAB — CBC WITH DIFFERENTIAL/PLATELET
Abs Immature Granulocytes: 0.99 10*3/uL — ABNORMAL HIGH (ref 0.00–0.07)
Basophils Absolute: 0 10*3/uL (ref 0.0–0.1)
Basophils Relative: 0 %
Eosinophils Absolute: 0.3 10*3/uL (ref 0.0–0.5)
Eosinophils Relative: 3 %
HCT: 46.5 % (ref 39.0–52.0)
Hemoglobin: 15.5 g/dL (ref 13.0–17.0)
Immature Granulocytes: 9 %
Lymphocytes Relative: 11 %
Lymphs Abs: 1.2 10*3/uL (ref 0.7–4.0)
MCH: 31.4 pg (ref 26.0–34.0)
MCHC: 33.3 g/dL (ref 30.0–36.0)
MCV: 94.3 fL (ref 80.0–100.0)
Monocytes Absolute: 1 10*3/uL (ref 0.1–1.0)
Monocytes Relative: 9 %
Neutro Abs: 7.9 10*3/uL — ABNORMAL HIGH (ref 1.7–7.7)
Neutrophils Relative %: 68 %
Platelets: 115 10*3/uL — ABNORMAL LOW (ref 150–400)
RBC: 4.93 MIL/uL (ref 4.22–5.81)
RDW: 16.2 % — ABNORMAL HIGH (ref 11.5–15.5)
WBC: 11.5 10*3/uL — ABNORMAL HIGH (ref 4.0–10.5)
nRBC: 0 % (ref 0.0–0.2)

## 2021-09-05 LAB — COMPREHENSIVE METABOLIC PANEL
ALT: 47 U/L — ABNORMAL HIGH (ref 0–44)
AST: 24 U/L (ref 15–41)
Albumin: 3.4 g/dL — ABNORMAL LOW (ref 3.5–5.0)
Alkaline Phosphatase: 65 U/L (ref 38–126)
Anion gap: 5 (ref 5–15)
BUN: 24 mg/dL — ABNORMAL HIGH (ref 8–23)
CO2: 26 mmol/L (ref 22–32)
Calcium: 8.9 mg/dL (ref 8.9–10.3)
Chloride: 108 mmol/L (ref 98–111)
Creatinine, Ser: 0.91 mg/dL (ref 0.61–1.24)
GFR, Estimated: >60 mL/min (ref >60)
Glucose, Bld: 98 mg/dL (ref 70–99)
Potassium: 4.4 mmol/L (ref 3.5–5.1)
Sodium: 139 mmol/L (ref 135–145)
Total Bilirubin: 0.5 mg/dL (ref 0.3–1.2)
Total Protein: 6 g/dL — ABNORMAL LOW (ref 6.5–8.1)

## 2021-09-05 LAB — LACTATE DEHYDROGENASE: LDH: 177 U/L (ref 98–192)

## 2021-09-05 LAB — MAGNESIUM: Magnesium: 1.9 mg/dL (ref 1.7–2.4)

## 2021-09-05 MED ORDER — SODIUM CHLORIDE 0.9% FLUSH
10.0000 mL | INTRAVENOUS | Status: DC | PRN
  Administered 2021-09-05 (×2): 10 mL

## 2021-09-05 MED ORDER — SODIUM CHLORIDE 0.9 % IV SOLN: Freq: Once | INTRAVENOUS | Status: AC

## 2021-09-05 MED ORDER — HEPARIN SOD (PORK) LOCK FLUSH 100 UNIT/ML IV SOLN
500.0000 [IU] | Freq: Once | INTRAVENOUS | Status: AC | PRN
  Administered 2021-09-05: 500 [IU]

## 2021-09-05 MED ORDER — SODIUM CHLORIDE 0.9 % IV SOLN
15.0000 mg/m2 | Freq: Once | INTRAVENOUS | Status: AC
  Administered 2021-09-05: 35 mg via INTRAVENOUS
  Filled 2021-09-05: qty 7

## 2021-09-05 MED ORDER — ONDANSETRON HCL 4 MG/2ML IJ SOLN
4.0000 mg | Freq: Once | INTRAMUSCULAR | Status: AC
  Administered 2021-09-05: 4 mg via INTRAVENOUS
  Filled 2021-09-05: qty 2

## 2021-09-05 NOTE — Patient Instructions (Signed)
[facility]  Discharge Instructions: ?Thank you for choosing [facility] to provide your oncology and hematology care.  ?If you have a lab appointment with the [facility], please come in thru the Main Entrance and check in at the main information desk. ? ?Wear comfortable clothing and clothing appropriate for easy access to any Portacath or PICC line.  ? ?We strive to give you quality time with your provider. You may need to reschedule your appointment if you arrive late (15 or more minutes).  Arriving late affects you and other patients whose appointments are after yours.  Also, if you miss three or more appointments without notifying the office, you may be dismissed from the clinic at the provider?s discretion.    ?  ?For prescription refill requests, have your pharmacy contact our office and allow 72 hours for refills to be completed.   ? ?Today you received the following chemotherapy and/or immunotherapy agents Decitabine, return as scheduled. ? ?To help prevent nausea and vomiting after your treatment, we encourage you to take your nausea medication as directed. ? ?BELOW ARE SYMPTOMS THAT SHOULD BE REPORTED IMMEDIATELY: ?*FEVER GREATER THAN 100.4 F (38 ?C) OR HIGHER ?*CHILLS OR SWEATING ?*NAUSEA AND VOMITING THAT IS NOT CONTROLLED WITH YOUR NAUSEA MEDICATION ?*UNUSUAL SHORTNESS OF BREATH ?*UNUSUAL BRUISING OR BLEEDING ?*URINARY PROBLEMS (pain or burning when urinating, or frequent urination) ?*BOWEL PROBLEMS (unusual diarrhea, constipation, pain near the anus) ?TENDERNESS IN MOUTH AND THROAT WITH OR WITHOUT PRESENCE OF ULCERS (sore throat, sores in mouth, or a toothache) ?UNUSUAL RASH, SWELLING OR PAIN  ?UNUSUAL VAGINAL DISCHARGE OR ITCHING  ? ?Items with * indicate a potential emergency and should be followed up as soon as possible or go to the Emergency Department if any problems should occur. ? ?Please show the CHEMOTHERAPY ALERT CARD or IMMUNOTHERAPY ALERT CARD at check-in to  the Emergency Department and triage nurse. ? ?Should you have questions after your visit or need to cancel or reschedule your appointment, please contact [facility] [phone]  and follow the prompts.  Office hours are 8:00 a.m. to 4:30 p.m. Monday - Friday. Please note that voicemails left after 4:00 p.m. may not be returned until the following business day.  We are closed weekends and major holidays. You have access to a nurse at all times for urgent questions. Please call the main number to the clinic [phone] and follow the prompts. ? ?For any non-urgent questions, you may also contact your provider using MyChart. We now offer e-Visits for anyone 77 and older to request care online for non-urgent symptoms. For details visit [email or web address]. ?  ?Also download the MyChart app! Go to the app store, search "MyChart", open the app, select [facility], and log in with your MyChart username and password. ? ?Due to Covid, a mask is required upon entering the hospital/clinic. If you do not have a mask, one will be given to you upon arrival. For doctor visits, patients may have 1 support person aged 77 or older with them. For treatment visits, patients cannot have anyone with them due to current Covid guidelines and our immunocompromised population.  ?

## 2021-09-05 NOTE — Progress Notes (Signed)
Patient tolerated chemotherapy with no complaints voiced. Side effects with management reviewed understanding verbalized. Port site clean and dry with no bruising or swelling noted at site. Good blood return noted before and after administration of chemotherapy. Band aid applied. Patient left in satisfactory condition with VSS and no s/s of distress noted. 

## 2021-09-06 ENCOUNTER — Inpatient Hospital Stay (HOSPITAL_COMMUNITY): Payer: Medicare Other

## 2021-09-06 ENCOUNTER — Encounter (HOSPITAL_COMMUNITY): Payer: Self-pay

## 2021-09-06 VITALS — BP 126/76 | HR 62 | Temp 97.2°F | Resp 18 | Wt 221.2 lb

## 2021-09-06 DIAGNOSIS — C9200 Acute myeloblastic leukemia, not having achieved remission: Secondary | ICD-10-CM

## 2021-09-06 DIAGNOSIS — Z5111 Encounter for antineoplastic chemotherapy: Secondary | ICD-10-CM | POA: Diagnosis not present

## 2021-09-06 DIAGNOSIS — D46Z Other myelodysplastic syndromes: Secondary | ICD-10-CM

## 2021-09-06 DIAGNOSIS — Z95828 Presence of other vascular implants and grafts: Secondary | ICD-10-CM

## 2021-09-06 MED ORDER — ONDANSETRON HCL 4 MG/2ML IJ SOLN
4.0000 mg | Freq: Once | INTRAMUSCULAR | Status: AC
  Administered 2021-09-06: 4 mg via INTRAVENOUS
  Filled 2021-09-06: qty 2

## 2021-09-06 MED ORDER — SODIUM CHLORIDE 0.9% FLUSH
10.0000 mL | INTRAVENOUS | Status: DC | PRN
  Administered 2021-09-06: 10 mL

## 2021-09-06 MED ORDER — SODIUM CHLORIDE 0.9 % IV SOLN: Freq: Once | INTRAVENOUS | Status: AC

## 2021-09-06 MED ORDER — OCTREOTIDE ACETATE 30 MG IM KIT
PACK | INTRAMUSCULAR | Status: AC
  Filled 2021-09-06: qty 1

## 2021-09-06 MED ORDER — SODIUM CHLORIDE 0.9 % IV SOLN
15.0000 mg/m2 | Freq: Once | INTRAVENOUS | Status: AC
  Administered 2021-09-06: 35 mg via INTRAVENOUS
  Filled 2021-09-06: qty 7

## 2021-09-06 MED ORDER — HEPARIN SOD (PORK) LOCK FLUSH 100 UNIT/ML IV SOLN
500.0000 [IU] | Freq: Once | INTRAVENOUS | Status: AC | PRN
  Administered 2021-09-06: 500 [IU]

## 2021-09-06 NOTE — Progress Notes (Signed)
Patient presents today for Day 2, treatment . Vital signs stable and within parameters for treatment. Patient denies any changes since his last treatment yesterday.  ? ?Treatment given today per MD orders. Tolerated infusion without adverse affects. Vital signs stable. No complaints at this time. Discharged from clinic by motorized chair in stable condition. Alert and oriented x 3. F/U with [facility] as scheduled.   ?

## 2021-09-06 NOTE — Patient Instructions (Signed)
[facility]  Discharge Instructions: ?Thank you for choosing [facility] to provide your oncology and hematology care.  ?If you have a lab appointment with the [facility], please come in thru the Main Entrance and check in at the main information desk. ? ?Wear comfortable clothing and clothing appropriate for easy access to any Portacath or PICC line.  ? ?We strive to give you quality time with your provider. You may need to reschedule your appointment if you arrive late (15 or more minutes).  Arriving late affects you and other patients whose appointments are after yours.  Also, if you miss three or more appointments without notifying the office, you may be dismissed from the clinic at the provider?s discretion.    ?  ?For prescription refill requests, have your pharmacy contact our office and allow 72 hours for refills to be completed.   ? ?Today you received the following chemotherapy and/or immunotherapy agents Decitabine.     ?  ?To help prevent nausea and vomiting after your treatment, we encourage you to take your nausea medication as directed. ? ?BELOW ARE SYMPTOMS THAT SHOULD BE REPORTED IMMEDIATELY: ?*FEVER GREATER THAN 100.4 F (38 ?C) OR HIGHER ?*CHILLS OR SWEATING ?*NAUSEA AND VOMITING THAT IS NOT CONTROLLED WITH YOUR NAUSEA MEDICATION ?*UNUSUAL SHORTNESS OF BREATH ?*UNUSUAL BRUISING OR BLEEDING ?*URINARY PROBLEMS (pain or burning when urinating, or frequent urination) ?*BOWEL PROBLEMS (unusual diarrhea, constipation, pain near the anus) ?TENDERNESS IN MOUTH AND THROAT WITH OR WITHOUT PRESENCE OF ULCERS (sore throat, sores in mouth, or a toothache) ?UNUSUAL RASH, SWELLING OR PAIN  ?UNUSUAL VAGINAL DISCHARGE OR ITCHING  ? ?Items with * indicate a potential emergency and should be followed up as soon as possible or go to the Emergency Department if any problems should occur. ? ?Please show the CHEMOTHERAPY ALERT CARD or IMMUNOTHERAPY ALERT CARD at check-in to the Emergency  Department and triage nurse. ? ?Should you have questions after your visit or need to cancel or reschedule your appointment, please contact [facility] [phone]  and follow the prompts.  Office hours are 8:00 a.m. to 4:30 p.m. Monday - Friday. Please note that voicemails left after 4:00 p.m. may not be returned until the following business day.  We are closed weekends and major holidays. You have access to a nurse at all times for urgent questions. Please call the main number to the clinic [phone] and follow the prompts. ? ?For any non-urgent questions, you may also contact your provider using MyChart. We now offer e-Visits for anyone 27 and older to request care online for non-urgent symptoms. For details visit [email or web address]. ?  ?Also download the MyChart app! Go to the app store, search "MyChart", open the app, select [facility], and log in with your MyChart username and password. ? ?Due to Covid, a mask is required upon entering the hospital/clinic. If you do not have a mask, one will be given to you upon arrival. For doctor visits, patients may have 1 support person aged 49 or older with them. For treatment visits, patients cannot have anyone with them due to current Covid guidelines and our immunocompromised population.  ?

## 2021-09-07 ENCOUNTER — Encounter (HOSPITAL_COMMUNITY): Payer: Self-pay

## 2021-09-07 ENCOUNTER — Inpatient Hospital Stay (HOSPITAL_COMMUNITY): Payer: Medicare Other

## 2021-09-07 VITALS — BP 110/72 | HR 63 | Temp 97.5°F | Resp 18

## 2021-09-07 DIAGNOSIS — C9200 Acute myeloblastic leukemia, not having achieved remission: Secondary | ICD-10-CM

## 2021-09-07 DIAGNOSIS — Z95828 Presence of other vascular implants and grafts: Secondary | ICD-10-CM

## 2021-09-07 DIAGNOSIS — D46Z Other myelodysplastic syndromes: Secondary | ICD-10-CM

## 2021-09-07 DIAGNOSIS — Z5111 Encounter for antineoplastic chemotherapy: Secondary | ICD-10-CM | POA: Diagnosis not present

## 2021-09-07 MED ORDER — SODIUM CHLORIDE 0.9 % IV SOLN: Freq: Once | INTRAVENOUS | Status: AC

## 2021-09-07 MED ORDER — HEPARIN SOD (PORK) LOCK FLUSH 100 UNIT/ML IV SOLN
500.0000 [IU] | Freq: Once | INTRAVENOUS | Status: AC | PRN
  Administered 2021-09-07: 500 [IU]

## 2021-09-07 MED ORDER — SODIUM CHLORIDE 0.9 % IV SOLN
15.0000 mg/m2 | Freq: Once | INTRAVENOUS | Status: AC
  Administered 2021-09-07: 35 mg via INTRAVENOUS
  Filled 2021-09-07: qty 7

## 2021-09-07 MED ORDER — ONDANSETRON HCL 4 MG/2ML IJ SOLN
4.0000 mg | Freq: Once | INTRAMUSCULAR | Status: AC
  Administered 2021-09-07: 4 mg via INTRAVENOUS
  Filled 2021-09-07: qty 2

## 2021-09-07 MED ORDER — SODIUM CHLORIDE 0.9% FLUSH
10.0000 mL | INTRAVENOUS | Status: DC | PRN
  Administered 2021-09-07 (×2): 10 mL

## 2021-09-07 NOTE — Progress Notes (Signed)
Patient tolerated chemotherapy with no complaints voiced. Side effects with management reviewed understanding verbalized. Port site clean and dry with no bruising or swelling noted at site. Good blood return noted before and after administration of chemotherapy. Band aid applied. Patient left in satisfactory condition with VSS and no s/s of distress noted. 

## 2021-09-07 NOTE — Patient Instructions (Signed)
[facility]  Discharge Instructions: ?Thank you for choosing [facility] to provide your oncology and hematology care.  ?If you have a lab appointment with the [facility], please come in thru the Main Entrance and check in at the main information desk. ? ?Wear comfortable clothing and clothing appropriate for easy access to any Portacath or PICC line.  ? ?We strive to give you quality time with your provider. You may need to reschedule your appointment if you arrive late (15 or more minutes).  Arriving late affects you and other patients whose appointments are after yours.  Also, if you miss three or more appointments without notifying the office, you may be dismissed from the clinic at the provider?s discretion.    ?  ?For prescription refill requests, have your pharmacy contact our office and allow 72 hours for refills to be completed.   ? ?Today you received the following chemotherapy and/or immunotherapy agents Decitabine, return as scheduled. ?  ?To help prevent nausea and vomiting after your treatment, we encourage you to take your nausea medication as directed. ? ?BELOW ARE SYMPTOMS THAT SHOULD BE REPORTED IMMEDIATELY: ?*FEVER GREATER THAN 100.4 F (38 ?C) OR HIGHER ?*CHILLS OR SWEATING ?*NAUSEA AND VOMITING THAT IS NOT CONTROLLED WITH YOUR NAUSEA MEDICATION ?*UNUSUAL SHORTNESS OF BREATH ?*UNUSUAL BRUISING OR BLEEDING ?*URINARY PROBLEMS (pain or burning when urinating, or frequent urination) ?*BOWEL PROBLEMS (unusual diarrhea, constipation, pain near the anus) ?TENDERNESS IN MOUTH AND THROAT WITH OR WITHOUT PRESENCE OF ULCERS (sore throat, sores in mouth, or a toothache) ?UNUSUAL RASH, SWELLING OR PAIN  ?UNUSUAL VAGINAL DISCHARGE OR ITCHING  ? ?Items with * indicate a potential emergency and should be followed up as soon as possible or go to the Emergency Department if any problems should occur. ? ?Please show the CHEMOTHERAPY ALERT CARD or IMMUNOTHERAPY ALERT CARD at check-in to  the Emergency Department and triage nurse. ? ?Should you have questions after your visit or need to cancel or reschedule your appointment, please contact [facility] [phone]  and follow the prompts.  Office hours are 8:00 a.m. to 4:30 p.m. Monday - Friday. Please note that voicemails left after 4:00 p.m. may not be returned until the following business day.  We are closed weekends and major holidays. You have access to a nurse at all times for urgent questions. Please call the main number to the clinic [phone] and follow the prompts. ? ?For any non-urgent questions, you may also contact your provider using MyChart. We now offer e-Visits for anyone 19 and older to request care online for non-urgent symptoms. For details visit [email or web address] ?  ?Also download the MyChart app! Go to the app store, search "MyChart", open the app, select [facility], and log in with your MyChart username and password. ? ?Due to Covid, a mask is required upon entering the hospital/clinic. If you do not have a mask, one will be given to you upon arrival. For doctor visits, patients may have 1 support person aged 74 or older with them. For treatment visits, patients cannot have anyone with them due to current Covid guidelines and our immunocompromised population.  ?

## 2021-09-08 ENCOUNTER — Encounter (HOSPITAL_COMMUNITY): Payer: Self-pay

## 2021-09-08 ENCOUNTER — Inpatient Hospital Stay (HOSPITAL_COMMUNITY): Payer: Medicare Other

## 2021-09-08 VITALS — BP 131/79 | HR 65 | Temp 97.2°F | Resp 18

## 2021-09-08 DIAGNOSIS — Z5111 Encounter for antineoplastic chemotherapy: Secondary | ICD-10-CM | POA: Diagnosis not present

## 2021-09-08 DIAGNOSIS — Z95828 Presence of other vascular implants and grafts: Secondary | ICD-10-CM

## 2021-09-08 DIAGNOSIS — C9200 Acute myeloblastic leukemia, not having achieved remission: Secondary | ICD-10-CM

## 2021-09-08 DIAGNOSIS — D46Z Other myelodysplastic syndromes: Secondary | ICD-10-CM

## 2021-09-08 MED ORDER — SODIUM CHLORIDE 0.9% FLUSH
10.0000 mL | INTRAVENOUS | Status: DC | PRN
  Administered 2021-09-08 (×2): 10 mL

## 2021-09-08 MED ORDER — SODIUM CHLORIDE 0.9 % IV SOLN: Freq: Once | INTRAVENOUS | Status: AC

## 2021-09-08 MED ORDER — SODIUM CHLORIDE 0.9 % IV SOLN
15.0000 mg/m2 | Freq: Once | INTRAVENOUS | Status: AC
  Administered 2021-09-08: 35 mg via INTRAVENOUS
  Filled 2021-09-08: qty 7

## 2021-09-08 MED ORDER — ONDANSETRON HCL 4 MG/2ML IJ SOLN
4.0000 mg | Freq: Once | INTRAMUSCULAR | Status: AC
  Administered 2021-09-08: 4 mg via INTRAVENOUS
  Filled 2021-09-08: qty 2

## 2021-09-08 MED ORDER — HEPARIN SOD (PORK) LOCK FLUSH 100 UNIT/ML IV SOLN
500.0000 [IU] | Freq: Once | INTRAVENOUS | Status: AC | PRN
  Administered 2021-09-08: 500 [IU]

## 2021-09-08 NOTE — Progress Notes (Signed)
Patient tolerated chemotherapy with no complaints voiced. Side effects with management reviewed understanding verbalized. Port site clean and dry with no bruising or swelling noted at site. Good blood return noted before and after administration of chemotherapy. Band aid applied. Patient left in satisfactory condition with VSS and no s/s of distress noted. 

## 2021-09-08 NOTE — Patient Instructions (Signed)
[facility]  Discharge Instructions: ?Thank you for choosing [facility] to provide your oncology and hematology care.  ?If you have a lab appointment with the [facility], please come in thru the Main Entrance and check in at the main information desk. ? ?Wear comfortable clothing and clothing appropriate for easy access to any Portacath or PICC line.  ? ?We strive to give you quality time with your provider. You may need to reschedule your appointment if you arrive late (15 or more minutes).  Arriving late affects you and other patients whose appointments are after yours.  Also, if you miss three or more appointments without notifying the office, you may be dismissed from the clinic at the provider?s discretion.    ?  ?For prescription refill requests, have your pharmacy contact our office and allow 72 hours for refills to be completed.   ? ?Today you received the following chemotherapy and/or immunotherapy agents Decitabine, return as scheduled. ?  ?To help prevent nausea and vomiting after your treatment, we encourage you to take your nausea medication as directed. ? ?BELOW ARE SYMPTOMS THAT SHOULD BE REPORTED IMMEDIATELY: ?*FEVER GREATER THAN 100.4 F (38 ?C) OR HIGHER ?*CHILLS OR SWEATING ?*NAUSEA AND VOMITING THAT IS NOT CONTROLLED WITH YOUR NAUSEA MEDICATION ?*UNUSUAL SHORTNESS OF BREATH ?*UNUSUAL BRUISING OR BLEEDING ?*URINARY PROBLEMS (pain or burning when urinating, or frequent urination) ?*BOWEL PROBLEMS (unusual diarrhea, constipation, pain near the anus) ?TENDERNESS IN MOUTH AND THROAT WITH OR WITHOUT PRESENCE OF ULCERS (sore throat, sores in mouth, or a toothache) ?UNUSUAL RASH, SWELLING OR PAIN  ?UNUSUAL VAGINAL DISCHARGE OR ITCHING  ? ?Items with * indicate a potential emergency and should be followed up as soon as possible or go to the Emergency Department if any problems should occur. ? ?Please show the CHEMOTHERAPY ALERT CARD or IMMUNOTHERAPY ALERT CARD at check-in to  the Emergency Department and triage nurse. ? ?Should you have questions after your visit or need to cancel or reschedule your appointment, please contact [facility] [phone]  and follow the prompts.  Office hours are 8:00 a.m. to 4:30 p.m. Monday - Friday. Please note that voicemails left after 4:00 p.m. may not be returned until the following business day.  We are closed weekends and major holidays. You have access to a nurse at all times for urgent questions. Please call the main number to the clinic [phone] and follow the prompts. ? ?For any non-urgent questions, you may also contact your provider using MyChart. We now offer e-Visits for anyone 13 and older to request care online for non-urgent symptoms. For details visit [email or web address] ?  ?Also download the MyChart app! Go to the app store, search "MyChart", open the app, select [facility], and log in with your MyChart username and password. ? ?Due to Covid, a mask is required upon entering the hospital/clinic. If you do not have a mask, one will be given to you upon arrival. For doctor visits, patients may have 1 support person aged 58 or older with them. For treatment visits, patients cannot have anyone with them due to current Covid guidelines and our immunocompromised population.  ?

## 2021-09-09 ENCOUNTER — Inpatient Hospital Stay (HOSPITAL_COMMUNITY): Payer: Medicare Other

## 2021-09-09 VITALS — BP 134/73 | HR 63 | Temp 97.5°F | Resp 18

## 2021-09-09 DIAGNOSIS — Z95828 Presence of other vascular implants and grafts: Secondary | ICD-10-CM

## 2021-09-09 DIAGNOSIS — C9200 Acute myeloblastic leukemia, not having achieved remission: Secondary | ICD-10-CM

## 2021-09-09 DIAGNOSIS — D46Z Other myelodysplastic syndromes: Secondary | ICD-10-CM

## 2021-09-09 DIAGNOSIS — Z5111 Encounter for antineoplastic chemotherapy: Secondary | ICD-10-CM | POA: Diagnosis not present

## 2021-09-09 MED ORDER — HEPARIN SOD (PORK) LOCK FLUSH 100 UNIT/ML IV SOLN
500.0000 [IU] | Freq: Once | INTRAVENOUS | Status: AC | PRN
  Administered 2021-09-09: 500 [IU]

## 2021-09-09 MED ORDER — ONDANSETRON HCL 4 MG/2ML IJ SOLN
4.0000 mg | Freq: Once | INTRAMUSCULAR | Status: AC
  Administered 2021-09-09: 4 mg via INTRAVENOUS
  Filled 2021-09-09: qty 2

## 2021-09-09 MED ORDER — SODIUM CHLORIDE 0.9% FLUSH
10.0000 mL | INTRAVENOUS | Status: DC | PRN
  Administered 2021-09-09 (×2): 10 mL

## 2021-09-09 MED ORDER — SODIUM CHLORIDE 0.9 % IV SOLN: Freq: Once | INTRAVENOUS | Status: AC

## 2021-09-09 MED ORDER — SODIUM CHLORIDE 0.9 % IV SOLN
15.0000 mg/m2 | Freq: Once | INTRAVENOUS | Status: AC
  Administered 2021-09-09: 35 mg via INTRAVENOUS
  Filled 2021-09-09: qty 7

## 2021-09-09 NOTE — Progress Notes (Signed)
Patient tolerated chemotherapy with no complaints voiced.  Side effects with management reviewed with understanding verbalized.  Port site clean and dry with no bruising or swelling noted at site.  Good blood return noted before and after administration of chemotherapy.  Band aid applied.  Patient left in satisfactory condition with VSS and no s/s of distress noted.   

## 2021-09-09 NOTE — Patient Instructions (Signed)
[facility]  Discharge Instructions: ?Thank you for choosing [facility] to provide your oncology and hematology care.  ?If you have a lab appointment with the [facility], please come in thru the Main Entrance and check in at the main information desk. ? ?Wear comfortable clothing and clothing appropriate for easy access to any Portacath or PICC line.  ? ?We strive to give you quality time with your provider. You may need to reschedule your appointment if you arrive late (15 or more minutes).  Arriving late affects you and other patients whose appointments are after yours.  Also, if you miss three or more appointments without notifying the office, you may be dismissed from the clinic at the provider?s discretion.    ?  ?For prescription refill requests, have your pharmacy contact our office and allow 72 hours for refills to be completed.   ? ?Today you received the following chemotherapy and/or immunotherapy agents decitabine.    ?  ?To help prevent nausea and vomiting after your treatment, we encourage you to take your nausea medication as directed. ? ?BELOW ARE SYMPTOMS THAT SHOULD BE REPORTED IMMEDIATELY: ?*FEVER GREATER THAN 100.4 F (38 ?C) OR HIGHER ?*CHILLS OR SWEATING ?*NAUSEA AND VOMITING THAT IS NOT CONTROLLED WITH YOUR NAUSEA MEDICATION ?*UNUSUAL SHORTNESS OF BREATH ?*UNUSUAL BRUISING OR BLEEDING ?*URINARY PROBLEMS (pain or burning when urinating, or frequent urination) ?*BOWEL PROBLEMS (unusual diarrhea, constipation, pain near the anus) ?TENDERNESS IN MOUTH AND THROAT WITH OR WITHOUT PRESENCE OF ULCERS (sore throat, sores in mouth, or a toothache) ?UNUSUAL RASH, SWELLING OR PAIN  ?UNUSUAL VAGINAL DISCHARGE OR ITCHING  ? ?Items with * indicate a potential emergency and should be followed up as soon as possible or go to the Emergency Department if any problems should occur. ? ?Please show the CHEMOTHERAPY ALERT CARD or IMMUNOTHERAPY ALERT CARD at check-in to the Emergency  Department and triage nurse. ? ?Should you have questions after your visit or need to cancel or reschedule your appointment, please contact [facility] [phone]  and follow the prompts.  Office hours are 8:00 a.m. to 4:30 p.m. Monday - Friday. Please note that voicemails left after 4:00 p.m. may not be returned until the following business day.  We are closed weekends and major holidays. You have access to a nurse at all times for urgent questions. Please call the main number to the clinic [phone] and follow the prompts. ? ?For any non-urgent questions, you may also contact your provider using MyChart. We now offer e-Visits for anyone 45 and older to request care online for non-urgent symptoms. For details visit [email or web address] ?  ?Also download the MyChart app! Go to the app store, search "MyChart", open the app, select [facility], and log in with your MyChart username and password. ? ?Due to Covid, a mask is required upon entering the hospital/clinic. If you do not have a mask, one will be given to you upon arrival. For doctor visits, patients may have 1 support person aged 54 or older with them. For treatment visits, patients cannot have anyone with them due to current Covid guidelines and our immunocompromised population.  ?

## 2021-09-22 ENCOUNTER — Other Ambulatory Visit (HOSPITAL_COMMUNITY): Payer: Self-pay

## 2021-09-22 MED ORDER — HYDROCODONE-ACETAMINOPHEN 10-325 MG PO TABS: 1.0000 | ORAL_TABLET | Freq: Four times a day (QID) | ORAL | 0 refills | Status: DC | PRN

## 2021-09-29 ENCOUNTER — Other Ambulatory Visit (HOSPITAL_COMMUNITY): Payer: Self-pay

## 2021-09-29 ENCOUNTER — Other Ambulatory Visit (HOSPITAL_COMMUNITY): Payer: Self-pay | Admitting: Hematology

## 2021-09-29 DIAGNOSIS — C9200 Acute myeloblastic leukemia, not having achieved remission: Secondary | ICD-10-CM

## 2021-10-17 ENCOUNTER — Inpatient Hospital Stay (HOSPITAL_COMMUNITY): Payer: Medicare Other

## 2021-10-17 ENCOUNTER — Inpatient Hospital Stay (HOSPITAL_COMMUNITY): Payer: Medicare Other | Attending: Hematology | Admitting: Hematology

## 2021-10-17 VITALS — BP 140/83 | HR 64 | Temp 97.6°F | Resp 18

## 2021-10-17 VITALS — BP 127/84 | HR 72 | Temp 97.5°F | Resp 18 | Wt 219.4 lb

## 2021-10-17 DIAGNOSIS — Z87891 Personal history of nicotine dependence: Secondary | ICD-10-CM | POA: Insufficient documentation

## 2021-10-17 DIAGNOSIS — C9200 Acute myeloblastic leukemia, not having achieved remission: Secondary | ICD-10-CM

## 2021-10-17 DIAGNOSIS — Z5111 Encounter for antineoplastic chemotherapy: Secondary | ICD-10-CM | POA: Insufficient documentation

## 2021-10-17 DIAGNOSIS — F0390 Unspecified dementia without behavioral disturbance: Secondary | ICD-10-CM | POA: Diagnosis not present

## 2021-10-17 DIAGNOSIS — Z79899 Other long term (current) drug therapy: Secondary | ICD-10-CM | POA: Insufficient documentation

## 2021-10-17 DIAGNOSIS — N183 Chronic kidney disease, stage 3 unspecified: Secondary | ICD-10-CM | POA: Diagnosis not present

## 2021-10-17 DIAGNOSIS — I129 Hypertensive chronic kidney disease with stage 1 through stage 4 chronic kidney disease, or unspecified chronic kidney disease: Secondary | ICD-10-CM | POA: Diagnosis not present

## 2021-10-17 DIAGNOSIS — D46Z Other myelodysplastic syndromes: Secondary | ICD-10-CM

## 2021-10-17 DIAGNOSIS — Z95828 Presence of other vascular implants and grafts: Secondary | ICD-10-CM

## 2021-10-17 DIAGNOSIS — Z85820 Personal history of malignant melanoma of skin: Secondary | ICD-10-CM | POA: Insufficient documentation

## 2021-10-17 LAB — CBC WITH DIFFERENTIAL/PLATELET
Abs Immature Granulocytes: 0.06 10*3/uL (ref 0.00–0.07)
Basophils Absolute: 0 10*3/uL (ref 0.0–0.1)
Basophils Relative: 1 %
Eosinophils Absolute: 0.2 10*3/uL (ref 0.0–0.5)
Eosinophils Relative: 4 %
HCT: 45.6 % (ref 39.0–52.0)
Hemoglobin: 14.9 g/dL (ref 13.0–17.0)
Immature Granulocytes: 1 %
Lymphocytes Relative: 25 %
Lymphs Abs: 1.1 10*3/uL (ref 0.7–4.0)
MCH: 31.2 pg (ref 26.0–34.0)
MCHC: 32.7 g/dL (ref 30.0–36.0)
MCV: 95.6 fL (ref 80.0–100.0)
Monocytes Absolute: 0.3 10*3/uL (ref 0.1–1.0)
Monocytes Relative: 8 %
Neutro Abs: 2.5 10*3/uL (ref 1.7–7.7)
Neutrophils Relative %: 61 %
Platelets: 136 10*3/uL — ABNORMAL LOW (ref 150–400)
RBC: 4.77 MIL/uL (ref 4.22–5.81)
RDW: 15.2 % (ref 11.5–15.5)
WBC: 4.2 10*3/uL (ref 4.0–10.5)
nRBC: 0 % (ref 0.0–0.2)

## 2021-10-17 LAB — COMPREHENSIVE METABOLIC PANEL
ALT: 30 U/L (ref 0–44)
AST: 25 U/L (ref 15–41)
Albumin: 3.7 g/dL (ref 3.5–5.0)
Alkaline Phosphatase: 67 U/L (ref 38–126)
Anion gap: 4 — ABNORMAL LOW (ref 5–15)
BUN: 20 mg/dL (ref 8–23)
CO2: 27 mmol/L (ref 22–32)
Calcium: 9.5 mg/dL (ref 8.9–10.3)
Chloride: 109 mmol/L (ref 98–111)
Creatinine, Ser: 1 mg/dL (ref 0.61–1.24)
GFR, Estimated: >60 mL/min (ref >60)
Glucose, Bld: 131 mg/dL — ABNORMAL HIGH (ref 70–99)
Potassium: 4.5 mmol/L (ref 3.5–5.1)
Sodium: 140 mmol/L (ref 135–145)
Total Bilirubin: 0.7 mg/dL (ref 0.3–1.2)
Total Protein: 6.2 g/dL — ABNORMAL LOW (ref 6.5–8.1)

## 2021-10-17 LAB — MAGNESIUM: Magnesium: 1.9 mg/dL (ref 1.7–2.4)

## 2021-10-17 LAB — LACTATE DEHYDROGENASE: LDH: 157 U/L (ref 98–192)

## 2021-10-17 MED ORDER — SODIUM CHLORIDE 0.9 % IV SOLN: Freq: Once | INTRAVENOUS | Status: AC

## 2021-10-17 MED ORDER — HEPARIN SOD (PORK) LOCK FLUSH 100 UNIT/ML IV SOLN
500.0000 [IU] | Freq: Once | INTRAVENOUS | Status: AC | PRN
  Administered 2021-10-17: 500 [IU]

## 2021-10-17 MED ORDER — ONDANSETRON HCL 4 MG/2ML IJ SOLN
4.0000 mg | Freq: Once | INTRAMUSCULAR | Status: AC
  Administered 2021-10-17: 4 mg via INTRAVENOUS
  Filled 2021-10-17: qty 2

## 2021-10-17 MED ORDER — SODIUM CHLORIDE 0.9% FLUSH
10.0000 mL | INTRAVENOUS | Status: DC | PRN
  Administered 2021-10-17: 10 mL

## 2021-10-17 MED ORDER — SODIUM CHLORIDE 0.9 % IV SOLN
15.0000 mg/m2 | Freq: Once | INTRAVENOUS | Status: AC
  Administered 2021-10-17: 35 mg via INTRAVENOUS
  Filled 2021-10-17: qty 7

## 2021-10-17 NOTE — Patient Instructions (Signed)
[facility]  Discharge Instructions: Thank you for choosing [facility] to provide your oncology and hematology care.  If you have a lab appointment with the [facility], please come in thru the Main Entrance and check in at the main information desk.  Wear comfortable clothing and clothing appropriate for easy access to any Portacath or PICC line.   We strive to give you quality time with your provider. You may need to reschedule your appointment if you arrive late (15 or more minutes).  Arriving late affects you and other patients whose appointments are after yours.  Also, if you miss three or more appointments without notifying the office, you may be dismissed from the clinic at the provider's discretion.      For prescription refill requests, have your pharmacy contact our office and allow 72 hours for refills to be completed.    Today you received the following chemotherapy and/or immunotherapy agents Decitabine      To help prevent nausea and vomiting after your treatment, we encourage you to take your nausea medication as directed.  BELOW ARE SYMPTOMS THAT SHOULD BE REPORTED IMMEDIATELY: *FEVER GREATER THAN 100.4 F (38 C) OR HIGHER *CHILLS OR SWEATING *NAUSEA AND VOMITING THAT IS NOT CONTROLLED WITH YOUR NAUSEA MEDICATION *UNUSUAL SHORTNESS OF BREATH *UNUSUAL BRUISING OR BLEEDING *URINARY PROBLEMS (pain or burning when urinating, or frequent urination) *BOWEL PROBLEMS (unusual diarrhea, constipation, pain near the anus) TENDERNESS IN MOUTH AND THROAT WITH OR WITHOUT PRESENCE OF ULCERS (sore throat, sores in mouth, or a toothache) UNUSUAL RASH, SWELLING OR PAIN  UNUSUAL VAGINAL DISCHARGE OR ITCHING   Items with * indicate a potential emergency and should be followed up as soon as possible or go to the Emergency Department if any problems should occur.  Please show the CHEMOTHERAPY ALERT CARD or IMMUNOTHERAPY ALERT CARD at check-in to the Emergency  Department and triage nurse.  Should you have questions after your visit or need to cancel or reschedule your appointment, please contact [facility] [phone]  and follow the prompts.  Office hours are 8:00 a.m. to 4:30 p.m. Monday - Friday. Please note that voicemails left after 4:00 p.m. may not be returned until the following business day.  We are closed weekends and major holidays. You have access to a nurse at all times for urgent questions. Please call the main number to the clinic [phone] and follow the prompts.  For any non-urgent questions, you may also contact your provider using MyChart. We now offer e-Visits for anyone 45 and older to request care online for non-urgent symptoms. For details visit [email or web address].   Also download the MyChart app! Go to the app store, search "MyChart", open the app, select [facility], and log in with your MyChart username and password.  Due to Covid, a mask is required upon entering the hospital/clinic. If you do not have a mask, one will be given to you upon arrival. For doctor visits, patients may have 1 support person aged 84 or older with them. For treatment visits, patients cannot have anyone with them due to current Covid guidelines and our immunocompromised population.

## 2021-10-17 NOTE — Progress Notes (Signed)
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[place])   08/22/2018 - 12/06/2018 Chemotherapy   The patient had palonosetron (ALOXI) injection 0.25 mg, 0.25 mg, Intravenous,  Once, 4 of 6 cycles Administration: 0.25 mg (08/22/2018), 0.25 mg (08/26/2018), 0.25 mg (08/28/2018), 0.25 mg (08/30/2018), 0.25 mg (10/01/2018), 0.25 mg (10/02/2018), 0.25 mg (11/04/2018), 0.25 mg (09/23/2018), 0.25 mg (09/25/2018), 0.25 mg (09/27/2018), 0.25 mg (10/28/2018), 0.25 mg (10/30/2018), 0.25 mg (11/01/2018), 0.25 mg (12/02/2018), 0.25 mg (12/04/2018), 0.25 mg (12/06/2018) azaCITIDine (VIDAZA) 100 mg in sodium chloride 0.9 % 50 mL chemo infusion, 110 mg (66.7 % of original dose 75 mg/m2), Intravenous, Once, 4 of 6 cycles Dose modification: 50 mg/m2 (original dose 75 mg/m2, Cycle 1, Reason: Provider Judgment), 50 mg/m2 (original dose 75 mg/m2, Cycle 2, Reason: Provider Judgment) Administration: 100 mg (08/22/2018), 100 mg (08/23/2018), 100 mg (08/26/2018), 100 mg (08/27/2018), 100 mg (08/28/2018), 100 mg (08/29/2018), 100 mg (08/30/2018), 100 mg (10/01/2018), 100 mg (10/02/2018), 100 mg (11/04/2018), 100 mg (11/05/2018), 100 mg (09/23/2018), 100 mg (09/24/2018), 100 mg (09/25/2018), 100 mg (09/26/2018), 100 mg (09/27/2018), 100 mg (10/28/2018), 100 mg (10/29/2018), 100 mg (10/30/2018), 100 mg (10/31/2018), 100 mg (11/01/2018), 100 mg (12/02/2018), 100 mg (12/03/2018), 100 mg (12/04/2018), 100 mg (12/05/2018), 100 mg (12/06/2018)  for chemotherapy treatment.    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  INTERVAL HISTORY:  [name], a 76 y.o. male, returns for routine follow-up and consideration for next cycle of chemotherapy. Mitul was last seen on 08/29/2021.  Due for cycle #24 of decitabine today.   Overall, he tells me he has been feeling pretty well. He reports numbness and weakness in his left hand which in limiting his movements in his left hand. He denies infections.   Overall, he feels ready for next cycle of chemo today.   REVIEW OF SYSTEMS:  Review of Systems  Constitutional:  Positive for appetite change. Negative for fatigue.  Musculoskeletal:  Positive for arthralgias (8/10).  Neurological:  Positive for extremity weakness (L hand) and numbness (L arm and hands).  All other systems reviewed and are negative.   PAST MEDICAL/SURGICAL HISTORY:  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   Cancer (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, MONITORED BY [facility] DR Annie Main  [facility]    Left ureteral calculus    Seasonal allergies    Wears glasses    Wears glasses    Wears partial dentures    upper and lower   Past Surgical History:  Procedure Laterality Date   ANKLE FUSION Right 2007   CARPAL TUNNEL RELEASE Left 12/28/2009   w/ pulley release left long finger   CARPAL TUNNEL RELEASE Right 07/22/2013   Procedure: RIGHT CARPAL TUNNEL RELEASE;  Surgeon: Cammie Sickle., MD;  Location: [facility];  Service: Orthopedics;  Laterality: Right;   COLONOSCOPY     CYSTO/  LEFT RETROGRADE PYELOGRAM  11/21/2010   CYSTOSCOPY WITH STENT PLACEMENT Left 03/09/2016   Procedure: CYSTOSCOPY WITH STENT PLACEMENT;  Surgeon: Franchot Gallo, MD;  Location: [facility];  Service: Urology;  Laterality: Left;   CYSTOSCOPY/RETROGRADE/URETEROSCOPY/STONE EXTRACTION WITH BASKET Left 03/09/2016   Procedure: CYSTOSCOPY/RETROGRADE/URETEROSCOPY/STONE EXTRACTION WITH BASKET;  Surgeon: Franchot Gallo, MD;  Location: [facility];  Service: Urology;  Laterality: Left;   LEFT URETEROSCOPIC LASER LITHOTRIPSY STONE EXTRACTION/ STENT PLACEMENT  05/23/2010   MOHS SURGERY     TOP OF THE HEAD    ORIF ANKLE FRACTURE Right 1978   PORT-A-CATH REMOVAL Right 02/14/2019   Procedure: MINOR REMOVAL PORT-A-CATH;  Surgeon: Aviva Signs, MD;  Location: [facility];  Service: General;  Laterality: Right;   PORTACATH PLACEMENT Right 08/19/2018   Procedure: INSERTION PORT-A-CATH (attached catheter in right subclavian);  Surgeon: Aviva Signs, MD;  Location: [facility];  Service: General;  Laterality: Right;   PORTACATH PLACEMENT Left 05/16/2019   Procedure: INSERTION PORT-A-CATH (attached catheter in left subclavian);  Surgeon: Aviva Signs, MD;  Location: [facility];  Service: General;  Laterality: Left;   POSTERIOR LUMBAR FUSION  08/21/2014   laminectomy and decompression L2 -- L5   RIGHT LOWER LEG SURGERY  X3  1975 to 1976   including ORIF   TONSILLECTOMY AND  ADENOIDECTOMY  1610   UMBILICAL HERNIA REPAIR  2009 approx    SOCIAL HISTORY:  Social History   Socioeconomic History   Marital status: Married    Spouse name: Not on file   Number of children: Not on file   Years of education: Not on file   Highest education level: Not on file  Occupational History   Not on file  Tobacco Use   Smoking status: Former    Years: 20.00    Types: Cigarettes    Quit date: 07/17/1986    Years since quitting: 35.2   Smokeless tobacco: Never  Vaping Use   Vaping Use: Never used  Substance and Sexual Activity   Alcohol use: Yes    Alcohol/week: 7.0 - 14.0 standard drinks of alcohol    Types: 7 - 14 Cans of beer per week    Comment: 1 -2 beer daily   Drug use: No   Sexual activity: Not Currently  Other Topics Concern   Not on file  Social History Narrative   Not on file   Social Determinants of Health   Financial Resource Strain: Low Risk  (  03/23/2020)   Overall Financial Resource Strain (CARDIA)    Difficulty of Paying Living Expenses: Not hard at all  Food Insecurity: No Food Insecurity (03/23/2020)   Hunger Vital Sign    Worried About Running Out of Food in the Last Year: Never true    Ran Out of Food in the Last Year: Never true  Transportation Needs: No Transportation Needs (03/23/2020)   PRAPARE - Hydrologist (Medical): No    Lack of Transportation (Non-Medical): No  Physical Activity: Unknown (02/17/2019)   Exercise Vital Sign    Days of Exercise per Week: Patient refused    Minutes of Exercise per Session: Patient refused  Stress: No Stress Concern Present (03/23/2020)   [place]    Feeling of Stress : Not at all  Social Connections: Moderately Isolated (03/23/2020)   Social Connection and Isolation Panel [NHANES]    Frequency of Communication with Friends and Family: Twice a week    Frequency of Social Gatherings with Friends and  Family: Never    Attends Religious Services: Never    Marine scientist or Organizations: Yes    Attends Music therapist: More than 4 times per year    Marital Status: Married  Human resources officer Violence: Unknown (02/17/2019)   Humiliation, Afraid, Rape, and Kick questionnaire    Fear of Current or Ex-Partner: Patient refused    Emotionally Abused: Patient refused    Physically Abused: Patient refused    Sexually Abused: Patient refused    FAMILY HISTORY:  Family History  Problem Relation Age of Onset   Stroke Mother    Prostate cancer Father    Bone cancer Father    Diverticulitis Father    Rheum arthritis Sister    Urinary tract infection Sister    Colon cancer Neg Hx     CURRENT MEDICATIONS:  Current Outpatient Medications  Medication Sig Dispense Refill   ACETAMINOPHEN EXTRA STRENGTH 500 MG capsule      allopurinol (ZYLOPRIM) 300 MG tablet Take 300 mg by mouth daily.     ALPRAZolam (XANAX) 0.25 MG tablet Take 1 tablet by mouth twice daily as needed for anxiety 60 tablet 0   amLODipine (NORVASC) 2.5 MG tablet Take 1 tablet (2.5 mg total) by mouth daily. 90 tablet 2   cetirizine (ZYRTEC) 10 MG tablet Take 10 mg by mouth daily. IN THE MORNING     diclofenac Sodium (VOLTAREN) 1 % GEL Apply three times daily to knees as needed for pain 50 g 3   docusate sodium (COLACE) 100 MG capsule Take 100 mg by mouth at bedtime.      donepezil (ARICEPT) 10 MG tablet Take 1 tablet (10 mg total) by mouth at bedtime. 30 tablet 3   HYDROcodone-acetaminophen (NORCO) 10-325 MG tablet Take 1 tablet by mouth every 6 (six) hours as needed. 112 tablet 0   ipratropium (ATROVENT) 0.03 % nasal spray Place 2 sprays into both nostrils 3 (three) times daily. 30 mL 3   lansoprazole (PREVACID) 15 MG capsule Take 15 mg by mouth daily.      lidocaine-prilocaine (EMLA) cream Apply 1 application topically See admin instructions. ONE HOUR PRIOR TO CHEMOTHERAPY APPOINTMENT 30 g 6   magnesium  oxide (MAG-OX) 400 (240 Mg) MG tablet TAKE 1 TABLET BY MOUTH THREE TIMES DAILY 90 tablet 3   magnesium oxide (MAG-OX) 400 (241.3 Mg) MG tablet Take 1 tablet (400 mg total) by mouth  3 (three) times daily. 90 tablet 3   Multiple Vitamins-Minerals (CENTRUM SILVER 50+MEN) TABS SMARTSIG:1 By Mouth     SF 5000 PLUS 1.1 % CREA dental cream SMARTSIG:Sparingly By Mouth Every Night     tamsulosin (FLOMAX) 0.4 MG CAPS capsule TAKE 1 CAPSULE BY MOUTH IN THE EVENING 30 capsule 6   tiZANidine (ZANAFLEX) 2 MG tablet Take 2 mg by mouth every 8 (eight) hours as needed.     venetoclax (VENCLEXTA) 100 MG tablet TAKE 2 TABLETS (200 MG) BY MOUTH DAILY 60 tablet 2   No current facility-administered medications for this visit.   Facility-Administered Medications Ordered in Other Visits  Medication Dose Route Frequency Provider Last Rate Last Admin   octreotide (SANDOSTATIN LAR) 30 MG IM injection            sodium chloride flush (NS) 0.9 % injection 10 mL  10 mL Intracatheter PRN [name], MD   10 mL at 11/21/19 1030   sodium chloride flush (NS) 0.9 % injection 20 mL  20 mL Intravenous PRN [name], MD   20 mL at 07/21/19 1053    ALLERGIES:  No Known Allergies  PHYSICAL EXAM:  Performance status (ECOG): 1 - Symptomatic but completely ambulatory  There were no vitals filed for this visit. Wt Readings from Last 3 Encounters:  09/06/21 221 lb 3.2 oz (100.3 kg)  08/29/21 223 lb 12.8 oz (101.5 kg)  07/18/21 221 lb 6.4 oz (100.4 kg)   Physical Exam Vitals reviewed.  Constitutional:      Appearance: Normal appearance. He is obese.     Comments: In wheelchair  Cardiovascular:     Rate and Rhythm: Normal rate and regular rhythm.     Pulses: Normal pulses.     Heart sounds: Normal heart sounds.  Pulmonary:     Effort: Pulmonary effort is normal.     Breath sounds: Normal breath sounds.  Musculoskeletal:     Right lower leg: No edema.     Left lower leg: No edema.  Neurological:      General: No focal deficit present.     Mental Status: He is alert and oriented to person, place, and time.  Psychiatric:        Mood and Affect: Mood normal.        Behavior: Behavior normal.     LABORATORY DATA:  I have reviewed the labs as listed.     Latest Ref Rng & Units 10/17/2021    9:45 AM 09/05/2021   10:24 AM 08/29/2021    8:55 AM  CBC  WBC 4.0 - 10.5 K/uL 4.2  11.5  10.4   Hemoglobin 13.0 - 17.0 g/dL 14.9  15.5  16.3   Hematocrit 39.0 - 52.0 % 45.6  46.5  49.5   Platelets 150 - 400 K/uL 136  115  138       Latest Ref Rng & Units 09/05/2021   10:24 AM 08/29/2021    8:55 AM 07/18/2021    9:44 AM  CMP  Glucose 70 - 99 mg/dL 98  96  112   BUN 8 - 23 mg/dL '24  27  20   '$ Creatinine 0.61 - 1.24 mg/dL 0.91  0.85  1.03   Sodium 135 - 145 mmol/L 139  141  141   Potassium 3.5 - 5.1 mmol/L 4.4  4.3  4.6   Chloride 98 - 111 mmol/L 108  108  106   CO2 22 - 32 mmol/L 26  27  26   Calcium 8.9 - 10.3 mg/dL 8.9  9.2  9.5   Total Protein 6.5 - 8.1 g/dL 6.0  6.6  6.2   Total Bilirubin 0.3 - 1.2 mg/dL 0.5  0.4  0.4   Alkaline Phos 38 - 126 U/L 65  64  67   AST 15 - 41 U/L '24  29  23   '$ ALT 0 - 44 U/L 47  48  32     DIAGNOSTIC IMAGING:  I have independently reviewed the scans and discussed with the patient. No results found.   ASSESSMENT:  1.  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  -BMBX on 02/25/2019 after cycle 1 did not show any evidence of leukemia. -CT CAP on 02/12/2019 shows numerous bilateral irregular/spiculated pulmonary nodules measuring up to 14 mm, nonspecific.  Hepatic steatosis.  Spleen is normal. -I have talked to Dr.Ravandi at [facility] per patient request.  There are clinical trials available upon progression of his AML.  There are also some clinical trials available now based on MRD positivity.  Patient not interested in moving to [place] at this time.   PLAN:  1.   AML: -He has tolerated last cycle very well. - He denies any infections. - He has weakness in the left hand and has difficulty flexing fingers.  This is unlikely from his treatments for AML.  I have recommended follow-up with neurology. - Reviewed labs today which showed normal LFTs and creatinine.  CBC was grossly normal with mild thrombocytopenia. - Proceed with next cycle of decitabine today.  Continue venetoclax 200 mg daily for 14 days. - RTC 6 weeks for follow-up with repeat labs.  Per patient's request, we will also check PSA.   2.  Arthralgias: - Continue hydrocodone 10/325 every 6 hours as needed.   3.  Hypomagnesemia: - Continue magnesium 3 times daily.  Magnesium is normal.   4.  Hypertension: - Continue Norvasc 2.5 mg daily.   5.  Dementia: - Continue Aricept 10 mg daily.   Orders placed this encounter:  No orders of the defined types were placed in this encounter.    [name], MD [place] [phone]   I, Thana Ates, am acting as a scribe for Dr. [name].  I, [name] MD, have reviewed the above documentation for accuracy and completeness, and I agree with the above.

## 2021-10-17 NOTE — Progress Notes (Signed)
Patient has been examined by Dr. Katragadda, and vital signs and labs have been reviewed. ANC, Creatinine, LFTs, hemoglobin, and platelets are within treatment parameters per M.D. - pt may proceed with treatment.    °

## 2021-10-17 NOTE — Progress Notes (Signed)
Patient presents today for Decitabine infusion per providers order.  Vital signs and labs within parameters for treatment.  Patient has no new complaints at this time.  Message received from Anastasio Champion RN/Dr. Delton Coombes patient okay for treatment.  Decitabine given today per MD orders.  Stable during infusion without adverse affects.  Vital signs stable.  No complaints at this time.  Discharge from clinic ambulatory in stable condition.  Alert and oriented X 3.  Follow up with [facility] as scheduled.

## 2021-10-17 NOTE — Patient Instructions (Addendum)
[place] at [facility] Discharge Instructions   You were seen and examined today by Dr. Delton Coombes.  He reviewed the results of your lab work which are normal.   We will proceed with your treatment today and the rest of the week.   Return as scheduled in 6 weeks.    Thank you for choosing [facility] at [facility] to provide your oncology and hematology care.  To afford each patient quality time with our provider, please arrive at least 15 minutes before your scheduled appointment time.   If you have a lab appointment with the [facility] please come in thru the Main Entrance and check in at the main information desk.  You need to re-schedule your appointment should you arrive 10 or more minutes late.  We strive to give you quality time with our providers, and arriving late affects you and other patients whose appointments are after yours.  Also, if you no show three or more times for appointments you may be dismissed from the clinic at the providers discretion.     Again, thank you for choosing [facility].  Our hope is that these requests will decrease the amount of time that you wait before being seen by our physicians.       _____________________________________________________________  Should you have questions after your visit to [facility], please contact our office at [phone] and follow the prompts.  Our office hours are 8:00 a.m. and 4:30 p.m. Monday - Friday.  Please note that voicemails left after 4:00 p.m. may not be returned until the following business day.  We are closed weekends and major holidays.  You do have access to a nurse 24-7, just call the main number to the clinic [phone] and do not press any options, hold on the line and a nurse will answer the phone.    For prescription refill requests, have your pharmacy contact our office and allow 72 hours.    Due to Covid, you will need  to wear a mask upon entering the hospital. If you do not have a mask, a mask will be given to you at the Main Entrance upon arrival. For doctor visits, patients may have 1 support person age 73 or older with them. For treatment visits, patients can not have anyone with them due to social distancing guidelines and our immunocompromised population.

## 2021-10-18 ENCOUNTER — Inpatient Hospital Stay (HOSPITAL_COMMUNITY): Payer: Medicare Other

## 2021-10-18 ENCOUNTER — Other Ambulatory Visit (HOSPITAL_COMMUNITY): Payer: Self-pay

## 2021-10-18 ENCOUNTER — Encounter (HOSPITAL_COMMUNITY): Payer: Self-pay

## 2021-10-18 VITALS — BP 138/83 | HR 61 | Temp 97.3°F | Resp 18

## 2021-10-18 DIAGNOSIS — C9200 Acute myeloblastic leukemia, not having achieved remission: Secondary | ICD-10-CM

## 2021-10-18 DIAGNOSIS — Z5111 Encounter for antineoplastic chemotherapy: Secondary | ICD-10-CM | POA: Diagnosis not present

## 2021-10-18 DIAGNOSIS — Z95828 Presence of other vascular implants and grafts: Secondary | ICD-10-CM

## 2021-10-18 DIAGNOSIS — D46Z Other myelodysplastic syndromes: Secondary | ICD-10-CM

## 2021-10-18 MED ORDER — ONDANSETRON HCL 4 MG/2ML IJ SOLN
4.0000 mg | Freq: Once | INTRAMUSCULAR | Status: AC
  Administered 2021-10-18: 4 mg via INTRAVENOUS
  Filled 2021-10-18: qty 2

## 2021-10-18 MED ORDER — SODIUM CHLORIDE 0.9% FLUSH
10.0000 mL | INTRAVENOUS | Status: DC | PRN
  Administered 2021-10-18: 10 mL

## 2021-10-18 MED ORDER — SODIUM CHLORIDE 0.9 % IV SOLN
15.0000 mg/m2 | Freq: Once | INTRAVENOUS | Status: AC
  Administered 2021-10-18: 35 mg via INTRAVENOUS
  Filled 2021-10-18: qty 7

## 2021-10-18 MED ORDER — HEPARIN SOD (PORK) LOCK FLUSH 100 UNIT/ML IV SOLN
500.0000 [IU] | Freq: Once | INTRAVENOUS | Status: AC | PRN
  Administered 2021-10-18: 500 [IU]

## 2021-10-18 MED ORDER — SODIUM CHLORIDE 0.9 % IV SOLN: Freq: Once | INTRAVENOUS | Status: AC

## 2021-10-18 MED ORDER — IPRATROPIUM BROMIDE 0.03 % NA SOLN: 2.0000 | Freq: Three times a day (TID) | NASAL | 6 refills | Status: DC

## 2021-10-18 NOTE — Patient Instructions (Signed)
[facility]  Discharge Instructions: Thank you for choosing [facility] to provide your oncology and hematology care.  If you have a lab appointment with the Cancer Center, please come in thru the Main Entrance and check in at the main information desk.  Wear comfortable clothing and clothing appropriate for easy access to any Portacath or PICC line.   We strive to give you quality time with your provider. You may need to reschedule your appointment if you arrive late (15 or more minutes).  Arriving late affects you and other patients whose appointments are after yours.  Also, if you miss three or more appointments without notifying the office, you may be dismissed from the clinic at the provider's discretion.      For prescription refill requests, have your pharmacy contact our office and allow 72 hours for refills to be completed.    Today you received the following chemotherapy and/or immunotherapy agents Decitabine, return as scheduled.  To help prevent nausea and vomiting after your treatment, we encourage you to take your nausea medication as directed.  BELOW ARE SYMPTOMS THAT SHOULD BE REPORTED IMMEDIATELY: *FEVER GREATER THAN 100.4 F (38 C) OR HIGHER *CHILLS OR SWEATING *NAUSEA AND VOMITING THAT IS NOT CONTROLLED WITH YOUR NAUSEA MEDICATION *UNUSUAL SHORTNESS OF BREATH *UNUSUAL BRUISING OR BLEEDING *URINARY PROBLEMS (pain or burning when urinating, or frequent urination) *BOWEL PROBLEMS (unusual diarrhea, constipation, pain near the anus) TENDERNESS IN MOUTH AND THROAT WITH OR WITHOUT PRESENCE OF ULCERS (sore throat, sores in mouth, or a toothache) UNUSUAL RASH, SWELLING OR PAIN  UNUSUAL VAGINAL DISCHARGE OR ITCHING   Items with * indicate a potential emergency and should be followed up as soon as possible or go to the Emergency Department if any problems should occur.  Please show the CHEMOTHERAPY ALERT CARD or IMMUNOTHERAPY ALERT CARD at check-in to  the Emergency Department and triage nurse.  Should you have questions after your visit or need to cancel or reschedule your appointment, please contact [facility] [phone]  and follow the prompts.  Office hours are 8:00 a.m. to 4:30 p.m. Monday - Friday. Please note that voicemails left after 4:00 p.m. may not be returned until the following business day.  We are closed weekends and major holidays. You have access to a nurse at all times for urgent questions. Please call the main number to the clinic [phone] and follow the prompts.  For any non-urgent questions, you may also contact your provider using MyChart. We now offer e-Visits for anyone 18 and older to request care online for non-urgent symptoms. For details visit [email or web address].   Also download the MyChart app! Go to the app store, search "MyChart", open the app, select [facility], and log in with your MyChart username and password.  Masks are optional in the cancer centers. If you would like for your care team to wear a mask while they are taking care of you, please let them know. For doctor visits, patients may have with them one support person who is at least 77 years old. At this time, visitors are not allowed in the infusion area.  

## 2021-10-18 NOTE — Progress Notes (Signed)
Patient presents today for Day 2 of Decitabine. Patient tolerated chemotherapy with no complaints voiced. Side effects with management reviewed understanding verbalized. Port site clean and dry with no bruising or swelling noted at site. Good blood return noted before and after administration of chemotherapy. Band aid applied. Patient left in satisfactory condition with VSS and no s/s of distress noted.

## 2021-10-19 ENCOUNTER — Encounter (HOSPITAL_COMMUNITY): Payer: Self-pay

## 2021-10-19 ENCOUNTER — Inpatient Hospital Stay (HOSPITAL_COMMUNITY): Payer: Medicare Other

## 2021-10-19 VITALS — BP 135/80 | HR 62 | Temp 97.5°F | Resp 18

## 2021-10-19 DIAGNOSIS — C9200 Acute myeloblastic leukemia, not having achieved remission: Secondary | ICD-10-CM

## 2021-10-19 DIAGNOSIS — Z95828 Presence of other vascular implants and grafts: Secondary | ICD-10-CM

## 2021-10-19 DIAGNOSIS — D46Z Other myelodysplastic syndromes: Secondary | ICD-10-CM

## 2021-10-19 DIAGNOSIS — Z5111 Encounter for antineoplastic chemotherapy: Secondary | ICD-10-CM | POA: Diagnosis not present

## 2021-10-19 MED ORDER — HEPARIN SOD (PORK) LOCK FLUSH 100 UNIT/ML IV SOLN
500.0000 [IU] | Freq: Once | INTRAVENOUS | Status: AC | PRN
  Administered 2021-10-19: 500 [IU]

## 2021-10-19 MED ORDER — ONDANSETRON HCL 4 MG/2ML IJ SOLN
4.0000 mg | Freq: Once | INTRAMUSCULAR | Status: AC
  Administered 2021-10-19: 4 mg via INTRAVENOUS
  Filled 2021-10-19: qty 2

## 2021-10-19 MED ORDER — SODIUM CHLORIDE 0.9% FLUSH
10.0000 mL | INTRAVENOUS | Status: DC | PRN
  Administered 2021-10-19: 10 mL

## 2021-10-19 MED ORDER — SODIUM CHLORIDE 0.9 % IV SOLN: Freq: Once | INTRAVENOUS | Status: AC

## 2021-10-19 MED ORDER — SODIUM CHLORIDE 0.9 % IV SOLN
15.0000 mg/m2 | Freq: Once | INTRAVENOUS | Status: AC
  Administered 2021-10-19: 35 mg via INTRAVENOUS
  Filled 2021-10-19: qty 7

## 2021-10-19 NOTE — Patient Instructions (Signed)
[facility]  Discharge Instructions: Thank you for choosing [facility] to provide your oncology and hematology care.  If you have a lab appointment with the Cancer Center, please come in thru the Main Entrance and check in at the main information desk.  Wear comfortable clothing and clothing appropriate for easy access to any Portacath or PICC line.   We strive to give you quality time with your provider. You may need to reschedule your appointment if you arrive late (15 or more minutes).  Arriving late affects you and other patients whose appointments are after yours.  Also, if you miss three or more appointments without notifying the office, you may be dismissed from the clinic at the provider's discretion.      For prescription refill requests, have your pharmacy contact our office and allow 72 hours for refills to be completed.    Today you received the following chemotherapy and/or immunotherapy agents Decitabine, return as scheduled. Decitabine injection for infusion What is this medication? DECITABINE (dee SYE ta been) is a chemotherapy drug. This medicine reduces the growth of cancer cells. It is used to treat adults with myelodysplastic syndromes. This medicine may be used for other purposes; ask your health care provider or pharmacist if you have questions. COMMON BRAND NAME(S): Dacogen What should I tell my care team before I take this medication? They need to know if you have any of these conditions: infection (especially a virus infection such as chickenpox, cold sores, or herpes) kidney disease liver disease an unusual or allergic reaction to decitabine, other medicines, foods, dyes, or preservatives pregnant or trying to get pregnant breast-feeding How should I use this medication? This medicine is for infusion into a vein. It is administered in a hospital or clinic by a doctor or health care professional. Talk to your pediatrician regarding the  use of this medicine in children. Special care may be needed. Overdosage: If you think you have taken too much of this medicine contact a poison control center or emergency room at once. NOTE: This medicine is only for you. Do not share this medicine with others. What if I miss a dose? It is important not to miss your dose. Call your doctor or health care professional if you are unable to keep an appointment. What may interact with this medication? vaccines Talk to your doctor or health care professional before taking any of these medicines: aspirin acetaminophen ibuprofen ketoprofen naproxen This list may not describe all possible interactions. Give your health care provider a list of all the medicines, herbs, non-prescription drugs, or dietary supplements you use. Also tell them if you smoke, drink alcohol, or use illegal drugs. Some items may interact with your medicine. What should I watch for while using this medication? Visit your doctor for checks on your progress. This drug may make you feel generally unwell. This is not uncommon, as chemotherapy can affect healthy cells as well as cancer cells. Report any side effects. Continue your course of treatment even though you feel ill unless your doctor tells you to stop. You may need blood work done while you are taking this medicine. In some cases, you may be given additional medicines to help with side effects. Follow all directions for their use. Call your doctor or health care professional for advice if you get a fever, chills or sore throat, or other symptoms of a cold or flu. Do not treat yourself. This drug decreases your body's ability to fight infections.   Try to avoid being around people who are sick. This medicine may increase your risk to bruise or bleed. Call your doctor or health care professional if you notice any unusual bleeding. Do not become pregnant while taking this medicine or for 6 months after stopping it. Women should  inform their doctor if they wish to become pregnant or think they might be pregnant. Men should not father a child while taking this medicine and for 3 months after stopping it. There is a potential for serious side effects to an unborn child. Talk to your health care professional or pharmacist for more information. Do not breast-feed an infant while taking this medicine or for at least 2 weeks after stopping it. In males, this medicine may interfere with the ability to father a child. Talk with your doctor or health care professional if you are concerned about your fertility. What side effects may I notice from receiving this medication? Side effects that you should report to your doctor or health care professional as soon as possible: low blood counts - this medicine may decrease the number of white blood cells, red blood cells and platelets. You may be at increased risk for infections and bleeding. signs of infection - fever or chills, cough, sore throat, pain or difficulty passing urine signs of decreased platelets or bleeding - bruising, pinpoint red spots on the skin, black, tarry stools, blood in the urine signs of decreased red blood cells - unusual weakness or tiredness, fainting spells, lightheadedness increased blood sugar Side effects that usually do not require medical attention (report to your doctor or health care professional if they continue or are bothersome): constipation diarrhea headache loss of appetite nausea, vomiting skin rash, itching stomach pain water retention weak or tired This list may not describe all possible side effects. Call your doctor for medical advice about side effects. You may report side effects to FDA at 1-800-FDA-1088. Where should I keep my medication? This drug is given in a hospital or clinic and will not be stored at home. NOTE: This sheet is a summary. It may not cover all possible information. If you have questions about this medicine, talk to  your doctor, pharmacist, or health care provider.  2023 Elsevier/Gold Standard (2018-07-08 00:00:00)    To help prevent nausea and vomiting after your treatment, we encourage you to take your nausea medication as directed.  BELOW ARE SYMPTOMS THAT SHOULD BE REPORTED IMMEDIATELY: *FEVER GREATER THAN 100.4 F (38 C) OR HIGHER *CHILLS OR SWEATING *NAUSEA AND VOMITING THAT IS NOT CONTROLLED WITH YOUR NAUSEA MEDICATION *UNUSUAL SHORTNESS OF BREATH *UNUSUAL BRUISING OR BLEEDING *URINARY PROBLEMS (pain or burning when urinating, or frequent urination) *BOWEL PROBLEMS (unusual diarrhea, constipation, pain near the anus) TENDERNESS IN MOUTH AND THROAT WITH OR WITHOUT PRESENCE OF ULCERS (sore throat, sores in mouth, or a toothache) UNUSUAL RASH, SWELLING OR PAIN  UNUSUAL VAGINAL DISCHARGE OR ITCHING   Items with * indicate a potential emergency and should be followed up as soon as possible or go to the Emergency Department if any problems should occur.  Please show the CHEMOTHERAPY ALERT CARD or IMMUNOTHERAPY ALERT CARD at check-in to the Emergency Department and triage nurse.  Should you have questions after your visit or need to cancel or reschedule your appointment, please contact [facility] [phone]  and follow the prompts.  Office hours are 8:00 a.m. to 4:30 p.m. Monday - Friday. Please note that voicemails left after 4:00 p.m. may not be returned until the   following business day.  We are closed weekends and major holidays. You have access to a nurse at all times for urgent questions. Please call the main number to the clinic [phone] and follow the prompts.  For any non-urgent questions, you may also contact your provider using MyChart. We now offer e-Visits for anyone 18 and older to request care online for non-urgent symptoms. For details visit [email or web address].   Also download the MyChart app! Go to the app store, search "MyChart", open the app, select [facility], and log in with your MyChart username and password.  Masks are optional in the cancer centers. If you would like for your care team to wear a mask while they are taking care of you, please let them know. For doctor visits, patients may have with them one support person who is at least 77 years old. At this time, visitors are not allowed in the infusion area.  

## 2021-10-19 NOTE — Progress Notes (Signed)
Patient tolerated chemotherapy with no complaints voiced. Side effects with management reviewed understanding verbalized. Port site clean and dry with no bruising or swelling noted at site. Good blood return noted before and after administration of chemotherapy. Band aid applied. Patient left in satisfactory condition with VSS and no s/s of distress noted. 

## 2021-10-20 ENCOUNTER — Inpatient Hospital Stay (HOSPITAL_COMMUNITY): Payer: Medicare Other

## 2021-10-20 VITALS — BP 131/85 | HR 62 | Temp 98.0°F | Resp 18

## 2021-10-20 DIAGNOSIS — Z5111 Encounter for antineoplastic chemotherapy: Secondary | ICD-10-CM | POA: Diagnosis not present

## 2021-10-20 DIAGNOSIS — Z95828 Presence of other vascular implants and grafts: Secondary | ICD-10-CM

## 2021-10-20 DIAGNOSIS — D46Z Other myelodysplastic syndromes: Secondary | ICD-10-CM

## 2021-10-20 DIAGNOSIS — C9200 Acute myeloblastic leukemia, not having achieved remission: Secondary | ICD-10-CM

## 2021-10-20 MED ORDER — SODIUM CHLORIDE 0.9 % IV SOLN
15.0000 mg/m2 | Freq: Once | INTRAVENOUS | Status: AC
  Administered 2021-10-20: 35 mg via INTRAVENOUS
  Filled 2021-10-20: qty 7

## 2021-10-20 MED ORDER — HEPARIN SOD (PORK) LOCK FLUSH 100 UNIT/ML IV SOLN
500.0000 [IU] | Freq: Once | INTRAVENOUS | Status: AC | PRN
  Administered 2021-10-20: 500 [IU]

## 2021-10-20 MED ORDER — SODIUM CHLORIDE 0.9% FLUSH
10.0000 mL | INTRAVENOUS | Status: DC | PRN
  Administered 2021-10-20: 10 mL

## 2021-10-20 MED ORDER — ONDANSETRON HCL 4 MG/2ML IJ SOLN
4.0000 mg | Freq: Once | INTRAMUSCULAR | Status: AC
  Administered 2021-10-20: 4 mg via INTRAVENOUS
  Filled 2021-10-20: qty 2

## 2021-10-20 MED ORDER — SODIUM CHLORIDE 0.9 % IV SOLN: Freq: Once | INTRAVENOUS | Status: AC

## 2021-10-20 NOTE — Progress Notes (Signed)
Patient presents today for Decitabine infusion.  Patient is in satisfactory condition with no new complaints voiced.  Vital signs are stable.  We will proceed with treatment per MD orders.   Patient tolerated treatment well with no complaints voiced.  Patient left via wheelchair in stable condition.  Vital signs stable at discharge.  Follow up as scheduled.

## 2021-10-20 NOTE — Patient Instructions (Signed)
[facility]  Discharge Instructions: Thank you for choosing [facility] to provide your oncology and hematology care.  If you have a lab appointment with the [facility], please come in thru the Main Entrance and check in at the main information desk.  Wear comfortable clothing and clothing appropriate for easy access to any Portacath or PICC line.   We strive to give you quality time with your provider. You may need to reschedule your appointment if you arrive late (15 or more minutes).  Arriving late affects you and other patients whose appointments are after yours.  Also, if you miss three or more appointments without notifying the office, you may be dismissed from the clinic at the provider's discretion.      For prescription refill requests, have your pharmacy contact our office and allow 72 hours for refills to be completed.    Today you received the following chemotherapy and/or immunotherapy agents Decitabine.  Decitabine injection for infusion What is this medication? DECITABINE (dee SYE ta been) is a chemotherapy drug. This medicine reduces the growth of cancer cells. It is used to treat adults with myelodysplastic syndromes. This medicine may be used for other purposes; ask your health care provider or pharmacist if you have questions. COMMON BRAND NAME(S): Dacogen What should I tell my care team before I take this medication? They need to know if you have any of these conditions: infection (especially a virus infection such as chickenpox, cold sores, or herpes) kidney disease liver disease an unusual or allergic reaction to decitabine, other medicines, foods, dyes, or preservatives pregnant or trying to get pregnant breast-feeding How should I use this medication? This medicine is for infusion into a vein. It is administered in a hospital or clinic by a doctor or health care professional. Talk to your pediatrician regarding the use of this medicine  in children. Special care may be needed. Overdosage: If you think you have taken too much of this medicine contact a poison control center or emergency room at once. NOTE: This medicine is only for you. Do not share this medicine with others. What if I miss a dose? It is important not to miss your dose. Call your doctor or health care professional if you are unable to keep an appointment. What may interact with this medication? vaccines Talk to your doctor or health care professional before taking any of these medicines: aspirin acetaminophen ibuprofen ketoprofen naproxen This list may not describe all possible interactions. Give your health care provider a list of all the medicines, herbs, non-prescription drugs, or dietary supplements you use. Also tell them if you smoke, drink alcohol, or use illegal drugs. Some items may interact with your medicine. What should I watch for while using this medication? Visit your doctor for checks on your progress. This drug may make you feel generally unwell. This is not uncommon, as chemotherapy can affect healthy cells as well as cancer cells. Report any side effects. Continue your course of treatment even though you feel ill unless your doctor tells you to stop. You may need blood work done while you are taking this medicine. In some cases, you may be given additional medicines to help with side effects. Follow all directions for their use. Call your doctor or health care professional for advice if you get a fever, chills or sore throat, or other symptoms of a cold or flu. Do not treat yourself. This drug decreases your body's ability to fight infections. Try to  avoid being around people who are sick. This medicine may increase your risk to bruise or bleed. Call your doctor or health care professional if you notice any unusual bleeding. Do not become pregnant while taking this medicine or for 6 months after stopping it. Women should inform their doctor if  they wish to become pregnant or think they might be pregnant. Men should not father a child while taking this medicine and for 3 months after stopping it. There is a potential for serious side effects to an unborn child. Talk to your health care professional or pharmacist for more information. Do not breast-feed an infant while taking this medicine or for at least 2 weeks after stopping it. In males, this medicine may interfere with the ability to father a child. Talk with your doctor or health care professional if you are concerned about your fertility. What side effects may I notice from receiving this medication? Side effects that you should report to your doctor or health care professional as soon as possible: low blood counts - this medicine may decrease the number of white blood cells, red blood cells and platelets. You may be at increased risk for infections and bleeding. signs of infection - fever or chills, cough, sore throat, pain or difficulty passing urine signs of decreased platelets or bleeding - bruising, pinpoint red spots on the skin, black, tarry stools, blood in the urine signs of decreased red blood cells - unusual weakness or tiredness, fainting spells, lightheadedness increased blood sugar Side effects that usually do not require medical attention (report to your doctor or health care professional if they continue or are bothersome): constipation diarrhea headache loss of appetite nausea, vomiting skin rash, itching stomach pain water retention weak or tired This list may not describe all possible side effects. Call your doctor for medical advice about side effects. You may report side effects to FDA at 1-800-FDA-1088. Where should I keep my medication? This drug is given in a hospital or clinic and will not be stored at home. NOTE: This sheet is a summary. It may not cover all possible information. If you have questions about this medicine, talk to your doctor,  pharmacist, or health care provider.  2023 Elsevier/Gold Standard (2018-07-08 00:00:00)        To help prevent nausea and vomiting after your treatment, we encourage you to take your nausea medication as directed.  BELOW ARE SYMPTOMS THAT SHOULD BE REPORTED IMMEDIATELY: *FEVER GREATER THAN 100.4 F (38 C) OR HIGHER *CHILLS OR SWEATING *NAUSEA AND VOMITING THAT IS NOT CONTROLLED WITH YOUR NAUSEA MEDICATION *UNUSUAL SHORTNESS OF BREATH *UNUSUAL BRUISING OR BLEEDING *URINARY PROBLEMS (pain or burning when urinating, or frequent urination) *BOWEL PROBLEMS (unusual diarrhea, constipation, pain near the anus) TENDERNESS IN MOUTH AND THROAT WITH OR WITHOUT PRESENCE OF ULCERS (sore throat, sores in mouth, or a toothache) UNUSUAL RASH, SWELLING OR PAIN  UNUSUAL VAGINAL DISCHARGE OR ITCHING   Items with * indicate a potential emergency and should be followed up as soon as possible or go to the Emergency Department if any problems should occur.  Please show the CHEMOTHERAPY ALERT CARD or IMMUNOTHERAPY ALERT CARD at check-in to the Emergency Department and triage nurse.  Should you have questions after your visit or need to cancel or reschedule your appointment, please contact [facility] [phone]  and follow the prompts.  Office hours are 8:00 a.m. to 4:30 p.m. Monday - Friday. Please note that voicemails left after 4:00 p.m. may not be returned  until the following business day.  We are closed weekends and major holidays. You have access to a nurse at all times for urgent questions. Please call the main number to the clinic [phone] and follow the prompts.  For any non-urgent questions, you may also contact your provider using MyChart. We now offer e-Visits for anyone 24 and older to request care online for non-urgent symptoms. For details visit [email or web address].   Also download the MyChart app! Go to the app store, search "MyChart", open the app, select , and  log in with your MyChart username and password.  Masks are optional in the cancer centers. If you would like for your care team to wear a mask while they are taking care of you, please let them know. For doctor visits, patients may have with them one support person who is at least 77 years old. At this time, visitors are not allowed in the infusion area.

## 2021-10-21 ENCOUNTER — Inpatient Hospital Stay (HOSPITAL_COMMUNITY): Payer: Medicare Other

## 2021-10-21 VITALS — BP 131/80 | HR 62 | Temp 97.8°F | Resp 18

## 2021-10-21 DIAGNOSIS — Z5111 Encounter for antineoplastic chemotherapy: Secondary | ICD-10-CM | POA: Diagnosis not present

## 2021-10-21 DIAGNOSIS — D46Z Other myelodysplastic syndromes: Secondary | ICD-10-CM

## 2021-10-21 DIAGNOSIS — Z95828 Presence of other vascular implants and grafts: Secondary | ICD-10-CM

## 2021-10-21 DIAGNOSIS — C9200 Acute myeloblastic leukemia, not having achieved remission: Secondary | ICD-10-CM

## 2021-10-21 MED ORDER — SODIUM CHLORIDE 0.9% FLUSH
10.0000 mL | INTRAVENOUS | Status: DC | PRN
  Administered 2021-10-21: 10 mL

## 2021-10-21 MED ORDER — HEPARIN SOD (PORK) LOCK FLUSH 100 UNIT/ML IV SOLN
500.0000 [IU] | Freq: Once | INTRAVENOUS | Status: AC | PRN
  Administered 2021-10-21: 500 [IU]

## 2021-10-21 MED ORDER — ONDANSETRON HCL 4 MG/2ML IJ SOLN
4.0000 mg | Freq: Once | INTRAMUSCULAR | Status: AC
  Administered 2021-10-21: 4 mg via INTRAVENOUS
  Filled 2021-10-21: qty 2

## 2021-10-21 MED ORDER — SODIUM CHLORIDE 0.9 % IV SOLN
15.0000 mg/m2 | Freq: Once | INTRAVENOUS | Status: AC
  Administered 2021-10-21: 35 mg via INTRAVENOUS
  Filled 2021-10-21: qty 7

## 2021-10-21 MED ORDER — SODIUM CHLORIDE 0.9 % IV SOLN: Freq: Once | INTRAVENOUS | Status: AC

## 2021-10-21 NOTE — Progress Notes (Signed)
Patient presents today for decitabine infusion.  Patient is in satisfactory condition with no new complaints voiced.  Vital signs are stable.  We will proceed with treatment per MD orders.   Patient tolerated treatment well with no complaints voiced.  Patient left via motorized wheelchair in stable condition.  Vital signs stable at discharge.  Follow up as scheduled.

## 2021-10-21 NOTE — Patient Instructions (Signed)
[facility]  Discharge Instructions: Thank you for choosing [facility] to provide your oncology and hematology care.  If you have a lab appointment with the [facility], please come in thru the Main Entrance and check in at the main information desk.  Wear comfortable clothing and clothing appropriate for easy access to any Portacath or PICC line.   We strive to give you quality time with your provider. You may need to reschedule your appointment if you arrive late (15 or more minutes).  Arriving late affects you and other patients whose appointments are after yours.  Also, if you miss three or more appointments without notifying the office, you may be dismissed from the clinic at the provider's discretion.      For prescription refill requests, have your pharmacy contact our office and allow 72 hours for refills to be completed.    Today you received the following chemotherapy and/or immunotherapy agents Decitabine.  Decitabine injection for infusion What is this medication? DECITABINE (dee SYE ta been) is a chemotherapy drug. This medicine reduces the growth of cancer cells. It is used to treat adults with myelodysplastic syndromes. This medicine may be used for other purposes; ask your health care provider or pharmacist if you have questions. COMMON BRAND NAME(S): Dacogen What should I tell my care team before I take this medication? They need to know if you have any of these conditions: infection (especially a virus infection such as chickenpox, cold sores, or herpes) kidney disease liver disease an unusual or allergic reaction to decitabine, other medicines, foods, dyes, or preservatives pregnant or trying to get pregnant breast-feeding How should I use this medication? This medicine is for infusion into a vein. It is administered in a hospital or clinic by a doctor or health care professional. Talk to your pediatrician regarding the use of this medicine  in children. Special care may be needed. Overdosage: If you think you have taken too much of this medicine contact a poison control center or emergency room at once. NOTE: This medicine is only for you. Do not share this medicine with others. What if I miss a dose? It is important not to miss your dose. Call your doctor or health care professional if you are unable to keep an appointment. What may interact with this medication? vaccines Talk to your doctor or health care professional before taking any of these medicines: aspirin acetaminophen ibuprofen ketoprofen naproxen This list may not describe all possible interactions. Give your health care provider a list of all the medicines, herbs, non-prescription drugs, or dietary supplements you use. Also tell them if you smoke, drink alcohol, or use illegal drugs. Some items may interact with your medicine. What should I watch for while using this medication? Visit your doctor for checks on your progress. This drug may make you feel generally unwell. This is not uncommon, as chemotherapy can affect healthy cells as well as cancer cells. Report any side effects. Continue your course of treatment even though you feel ill unless your doctor tells you to stop. You may need blood work done while you are taking this medicine. In some cases, you may be given additional medicines to help with side effects. Follow all directions for their use. Call your doctor or health care professional for advice if you get a fever, chills or sore throat, or other symptoms of a cold or flu. Do not treat yourself. This drug decreases your body's ability to fight infections. Try to  avoid being around people who are sick. This medicine may increase your risk to bruise or bleed. Call your doctor or health care professional if you notice any unusual bleeding. Do not become pregnant while taking this medicine or for 6 months after stopping it. Women should inform their doctor if  they wish to become pregnant or think they might be pregnant. Men should not father a child while taking this medicine and for 3 months after stopping it. There is a potential for serious side effects to an unborn child. Talk to your health care professional or pharmacist for more information. Do not breast-feed an infant while taking this medicine or for at least 2 weeks after stopping it. In males, this medicine may interfere with the ability to father a child. Talk with your doctor or health care professional if you are concerned about your fertility. What side effects may I notice from receiving this medication? Side effects that you should report to your doctor or health care professional as soon as possible: low blood counts - this medicine may decrease the number of white blood cells, red blood cells and platelets. You may be at increased risk for infections and bleeding. signs of infection - fever or chills, cough, sore throat, pain or difficulty passing urine signs of decreased platelets or bleeding - bruising, pinpoint red spots on the skin, black, tarry stools, blood in the urine signs of decreased red blood cells - unusual weakness or tiredness, fainting spells, lightheadedness increased blood sugar Side effects that usually do not require medical attention (report to your doctor or health care professional if they continue or are bothersome): constipation diarrhea headache loss of appetite nausea, vomiting skin rash, itching stomach pain water retention weak or tired This list may not describe all possible side effects. Call your doctor for medical advice about side effects. You may report side effects to FDA at 1-800-FDA-1088. Where should I keep my medication? This drug is given in a hospital or clinic and will not be stored at home. NOTE: This sheet is a summary. It may not cover all possible information. If you have questions about this medicine, talk to your doctor,  pharmacist, or health care provider.  2023 Elsevier/Gold Standard (2018-07-08 00:00:00)        To help prevent nausea and vomiting after your treatment, we encourage you to take your nausea medication as directed.  BELOW ARE SYMPTOMS THAT SHOULD BE REPORTED IMMEDIATELY: *FEVER GREATER THAN 100.4 F (38 C) OR HIGHER *CHILLS OR SWEATING *NAUSEA AND VOMITING THAT IS NOT CONTROLLED WITH YOUR NAUSEA MEDICATION *UNUSUAL SHORTNESS OF BREATH *UNUSUAL BRUISING OR BLEEDING *URINARY PROBLEMS (pain or burning when urinating, or frequent urination) *BOWEL PROBLEMS (unusual diarrhea, constipation, pain near the anus) TENDERNESS IN MOUTH AND THROAT WITH OR WITHOUT PRESENCE OF ULCERS (sore throat, sores in mouth, or a toothache) UNUSUAL RASH, SWELLING OR PAIN  UNUSUAL VAGINAL DISCHARGE OR ITCHING   Items with * indicate a potential emergency and should be followed up as soon as possible or go to the Emergency Department if any problems should occur.  Please show the CHEMOTHERAPY ALERT CARD or IMMUNOTHERAPY ALERT CARD at check-in to the Emergency Department and triage nurse.  Should you have questions after your visit or need to cancel or reschedule your appointment, please contact [facility] [phone]  and follow the prompts.  Office hours are 8:00 a.m. to 4:30 p.m. Monday - Friday. Please note that voicemails left after 4:00 p.m. may not be returned  until the following business day.  We are closed weekends and major holidays. You have access to a nurse at all times for urgent questions. Please call the main number to the clinic [phone] and follow the prompts.  For any non-urgent questions, you may also contact your provider using MyChart. We now offer e-Visits for anyone 58 and older to request care online for non-urgent symptoms. For details visit [email or web address].   Also download the MyChart app! Go to the app store, search "MyChart", open the app, select [facility], and  log in with your MyChart username and password.  Masks are optional in the cancer centers. If you would like for your care team to wear a mask while they are taking care of you, please let them know. For doctor visits, patients may have with them one support person who is at least 77 years old. At this time, visitors are not allowed in the infusion area.

## 2021-10-31 ENCOUNTER — Other Ambulatory Visit (HOSPITAL_COMMUNITY): Payer: Self-pay

## 2021-10-31 MED ORDER — HYDROCODONE-ACETAMINOPHEN 10-325 MG PO TABS: 1.0000 | ORAL_TABLET | Freq: Four times a day (QID) | ORAL | 0 refills | Status: DC | PRN

## 2021-11-25 ENCOUNTER — Other Ambulatory Visit (HOSPITAL_COMMUNITY): Payer: Self-pay | Admitting: *Deleted

## 2021-11-25 ENCOUNTER — Other Ambulatory Visit (HOSPITAL_COMMUNITY): Payer: Self-pay

## 2021-11-25 DIAGNOSIS — R3915 Urgency of urination: Secondary | ICD-10-CM

## 2021-11-25 DIAGNOSIS — Z125 Encounter for screening for malignant neoplasm of prostate: Secondary | ICD-10-CM

## 2021-11-28 ENCOUNTER — Other Ambulatory Visit: Payer: Self-pay

## 2021-11-28 ENCOUNTER — Inpatient Hospital Stay (HOSPITAL_BASED_OUTPATIENT_CLINIC_OR_DEPARTMENT_OTHER): Payer: Medicare Other | Admitting: Hematology

## 2021-11-28 ENCOUNTER — Inpatient Hospital Stay (HOSPITAL_COMMUNITY): Payer: Medicare Other

## 2021-11-28 ENCOUNTER — Inpatient Hospital Stay (HOSPITAL_COMMUNITY): Payer: Medicare Other | Attending: Hematology

## 2021-11-28 ENCOUNTER — Ambulatory Visit (HOSPITAL_COMMUNITY)
Admission: RE | Admit: 2021-11-28 | Discharge: 2021-11-28 | Disposition: A | Discharge status: Home / Self Care | Payer: Medicare Other | Source: Ambulatory Visit | Attending: Hematology | Admitting: Hematology

## 2021-11-28 VITALS — BP 140/85 | HR 62 | Temp 97.1°F | Resp 18

## 2021-11-28 VITALS — BP 141/79 | HR 72 | Temp 98.0°F | Resp 16 | Ht 69.5 in | Wt 224.9 lb

## 2021-11-28 DIAGNOSIS — Z79899 Other long term (current) drug therapy: Secondary | ICD-10-CM | POA: Diagnosis not present

## 2021-11-28 DIAGNOSIS — D46Z Other myelodysplastic syndromes: Secondary | ICD-10-CM

## 2021-11-28 DIAGNOSIS — Z125 Encounter for screening for malignant neoplasm of prostate: Secondary | ICD-10-CM

## 2021-11-28 DIAGNOSIS — Z95828 Presence of other vascular implants and grafts: Secondary | ICD-10-CM

## 2021-11-28 DIAGNOSIS — C9200 Acute myeloblastic leukemia, not having achieved remission: Secondary | ICD-10-CM

## 2021-11-28 DIAGNOSIS — M7989 Other specified soft tissue disorders: Secondary | ICD-10-CM | POA: Insufficient documentation

## 2021-11-28 DIAGNOSIS — R3915 Urgency of urination: Secondary | ICD-10-CM

## 2021-11-28 DIAGNOSIS — N183 Chronic kidney disease, stage 3 unspecified: Secondary | ICD-10-CM | POA: Diagnosis not present

## 2021-11-28 DIAGNOSIS — M79604 Pain in right leg: Secondary | ICD-10-CM | POA: Insufficient documentation

## 2021-11-28 DIAGNOSIS — I129 Hypertensive chronic kidney disease with stage 1 through stage 4 chronic kidney disease, or unspecified chronic kidney disease: Secondary | ICD-10-CM | POA: Diagnosis not present

## 2021-11-28 DIAGNOSIS — F0390 Unspecified dementia without behavioral disturbance: Secondary | ICD-10-CM | POA: Insufficient documentation

## 2021-11-28 LAB — CBC WITH DIFFERENTIAL/PLATELET
Abs Immature Granulocytes: 0.03 10*3/uL (ref 0.00–0.07)
Basophils Absolute: 0 10*3/uL (ref 0.0–0.1)
Basophils Relative: 1 %
Eosinophils Absolute: 0.1 10*3/uL (ref 0.0–0.5)
Eosinophils Relative: 2 %
HCT: 45.4 % (ref 39.0–52.0)
Hemoglobin: 15 g/dL (ref 13.0–17.0)
Immature Granulocytes: 1 %
Lymphocytes Relative: 18 %
Lymphs Abs: 0.8 10*3/uL (ref 0.7–4.0)
MCH: 31.6 pg (ref 26.0–34.0)
MCHC: 33 g/dL (ref 30.0–36.0)
MCV: 95.6 fL (ref 80.0–100.0)
Monocytes Absolute: 0.3 10*3/uL (ref 0.1–1.0)
Monocytes Relative: 7 %
Neutro Abs: 3.4 10*3/uL (ref 1.7–7.7)
Neutrophils Relative %: 71 %
Platelets: 145 10*3/uL — ABNORMAL LOW (ref 150–400)
RBC: 4.75 MIL/uL (ref 4.22–5.81)
RDW: 15.5 % (ref 11.5–15.5)
WBC: 4.7 10*3/uL (ref 4.0–10.5)
nRBC: 0 % (ref 0.0–0.2)

## 2021-11-28 LAB — COMPREHENSIVE METABOLIC PANEL
ALT: 31 U/L (ref 0–44)
AST: 25 U/L (ref 15–41)
Albumin: 3.9 g/dL (ref 3.5–5.0)
Alkaline Phosphatase: 64 U/L (ref 38–126)
Anion gap: 6 (ref 5–15)
BUN: 20 mg/dL (ref 8–23)
CO2: 25 mmol/L (ref 22–32)
Calcium: 9.2 mg/dL (ref 8.9–10.3)
Chloride: 109 mmol/L (ref 98–111)
Creatinine, Ser: 0.88 mg/dL (ref 0.61–1.24)
GFR, Estimated: >60 mL/min (ref >60)
Glucose, Bld: 101 mg/dL — ABNORMAL HIGH (ref 70–99)
Potassium: 4.2 mmol/L (ref 3.5–5.1)
Sodium: 140 mmol/L (ref 135–145)
Total Bilirubin: 0.7 mg/dL (ref 0.3–1.2)
Total Protein: 6.2 g/dL — ABNORMAL LOW (ref 6.5–8.1)

## 2021-11-28 LAB — LACTATE DEHYDROGENASE: LDH: 147 U/L (ref 98–192)

## 2021-11-28 LAB — PSA: Prostatic Specific Antigen: 2.37 ng/mL (ref 0.00–4.00)

## 2021-11-28 LAB — MAGNESIUM: Magnesium: 2 mg/dL (ref 1.7–2.4)

## 2021-11-28 MED ORDER — SODIUM CHLORIDE 0.9% FLUSH
10.0000 mL | INTRAVENOUS | Status: DC | PRN
  Administered 2021-11-28: 10 mL

## 2021-11-28 MED ORDER — FUROSEMIDE 20 MG PO TABS: 20.0000 mg | ORAL_TABLET | Freq: Every day | ORAL | 2 refills | Status: DC | PRN

## 2021-11-28 MED ORDER — ONDANSETRON HCL 4 MG/2ML IJ SOLN
4.0000 mg | Freq: Once | INTRAMUSCULAR | Status: AC
  Administered 2021-11-28: 4 mg via INTRAVENOUS
  Filled 2021-11-28: qty 2

## 2021-11-28 MED ORDER — SODIUM CHLORIDE 0.9 % IV SOLN: Freq: Once | INTRAVENOUS | Status: AC

## 2021-11-28 MED ORDER — SODIUM CHLORIDE 0.9 % IV SOLN
15.0000 mg/m2 | Freq: Once | INTRAVENOUS | Status: AC
  Administered 2021-11-28: 35 mg via INTRAVENOUS
  Filled 2021-11-28: qty 7

## 2021-11-28 MED ORDER — HEPARIN SOD (PORK) LOCK FLUSH 100 UNIT/ML IV SOLN
500.0000 [IU] | Freq: Once | INTRAVENOUS | Status: AC | PRN
  Administered 2021-11-28: 500 [IU]

## 2021-11-28 NOTE — Patient Instructions (Signed)
[facility]  Discharge Instructions: Thank you for choosing [facility] to provide your oncology and hematology care.  If you have a lab appointment with the [facility], please come in thru the Main Entrance and check in at the main information desk.  Wear comfortable clothing and clothing appropriate for easy access to any Portacath or PICC line.   We strive to give you quality time with your provider. You may need to reschedule your appointment if you arrive late (15 or more minutes).  Arriving late affects you and other patients whose appointments are after yours.  Also, if you miss three or more appointments without notifying the office, you may be dismissed from the clinic at the provider's discretion.      For prescription refill requests, have your pharmacy contact our office and allow 72 hours for refills to be completed.    Today you received the following chemotherapy and/or immunotherapy agents Decitabine      To help prevent nausea and vomiting after your treatment, we encourage you to take your nausea medication as directed.  BELOW ARE SYMPTOMS THAT SHOULD BE REPORTED IMMEDIATELY: *FEVER GREATER THAN 100.4 F (38 C) OR HIGHER *CHILLS OR SWEATING *NAUSEA AND VOMITING THAT IS NOT CONTROLLED WITH YOUR NAUSEA MEDICATION *UNUSUAL SHORTNESS OF BREATH *UNUSUAL BRUISING OR BLEEDING *URINARY PROBLEMS (pain or burning when urinating, or frequent urination) *BOWEL PROBLEMS (unusual diarrhea, constipation, pain near the anus) TENDERNESS IN MOUTH AND THROAT WITH OR WITHOUT PRESENCE OF ULCERS (sore throat, sores in mouth, or a toothache) UNUSUAL RASH, SWELLING OR PAIN  UNUSUAL VAGINAL DISCHARGE OR ITCHING   Items with * indicate a potential emergency and should be followed up as soon as possible or go to the Emergency Department if any problems should occur.  Please show the CHEMOTHERAPY ALERT CARD or IMMUNOTHERAPY ALERT CARD at check-in to the Emergency  Department and triage nurse.  Should you have questions after your visit or need to cancel or reschedule your appointment, please contact [facility] [phone]  and follow the prompts.  Office hours are 8:00 a.m. to 4:30 p.m. Monday - Friday. Please note that voicemails left after 4:00 p.m. may not be returned until the following business day.  We are closed weekends and major holidays. You have access to a nurse at all times for urgent questions. Please call the main number to the clinic [phone] and follow the prompts.  For any non-urgent questions, you may also contact your provider using MyChart. We now offer e-Visits for anyone 77 and older to request care online for non-urgent symptoms. For details visit [email or web address].   Also download the MyChart app! Go to the app store, search "MyChart", open the app, select [facility], and log in with your MyChart username and password.  Masks are optional in the cancer centers. If you would like for your care team to wear a mask while they are taking care of you, please let them know. For doctor visits, patients may have with them one support person who is at least 77 years old. At this time, visitors are not allowed in the infusion area.

## 2021-11-28 NOTE — Progress Notes (Signed)
Patient presents today for Decitabine infusion per providers order.  Vital signs and labs within parameters for treatment.  Message received from Anastasio Champion RN/Dr.Katragadda patient okay for treatment.  Decitabine given today per MD orders.  Stable during infusion without adverse affects.  Vital signs stable.  No complaints at this time.  Discharge from clinic ambulatory in stable condition.  Alert and oriented X 3.  Follow up with [facility] as scheduled.

## 2021-11-28 NOTE — Patient Instructions (Addendum)
[place] at [facility] Discharge Instructions   You were seen and examined today by Dr. Delton Coombes.  He reviewed the results of your lab work which is normal/stable.   We will arrange for you to have an ultrasound of your right lower leg to evaluate the swelling and make sure you don't have a blood clot. If there is no clot, we will plan to start you on a fluid pill to address the swelling.   Return as scheduled.    Thank you for choosing [place] [facility] at [facility] to provide your oncology and hematology care.  To afford each patient quality time with our provider, please arrive at least 15 minutes before your scheduled appointment time.   If you have a lab appointment with the [facility] please come in thru the Main Entrance and check in at the main information desk.  You need to re-schedule your appointment should you arrive 10 or more minutes late.  We strive to give you quality time with our providers, and arriving late affects you and other patients whose appointments are after yours.  Also, if you no show three or more times for appointments you may be dismissed from the clinic at the providers discretion.     Again, thank you for choosing [facility].  Our hope is that these requests will decrease the amount of time that you wait before being seen by our physicians.       _____________________________________________________________  Should you have questions after your visit to [facility], please contact our office at [phone] and follow the prompts.  Our office hours are 8:00 a.m. and 4:30 p.m. Monday - Friday.  Please note that voicemails left after 4:00 p.m. may not be returned until the following business day.  We are closed weekends and major holidays.  You do have access to a nurse 24-7, just call the main number to the clinic [phone] and do not press any options, hold on the line and a nurse  will answer the phone.    For prescription refill requests, have your pharmacy contact our office and allow 72 hours.    Due to Covid, you will need to wear a mask upon entering the hospital. If you do not have a mask, a mask will be given to you at the Main Entrance upon arrival. For doctor visits, patients may have 1 support person age 19 or older with them. For treatment visits, patients can not have anyone with them due to social distancing guidelines and our immunocompromised population.

## 2021-11-28 NOTE — Progress Notes (Signed)
Richard Wallace, Richard Wallace   CLINIC:  Medical Oncology/Hematology  PCP:  [name], MD [address] 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[place])   08/22/2018 - 12/06/2018 Chemotherapy   The patient had palonosetron (ALOXI) injection 0.25 mg, 0.25 mg, Intravenous,  Once, 4 of 6 cycles Administration: 0.25 mg (08/22/2018), 0.25 mg (08/26/2018), 0.25 mg (08/28/2018), 0.25 mg (08/30/2018), 0.25 mg (10/01/2018), 0.25 mg (10/02/2018), 0.25 mg (11/04/2018), 0.25 mg (09/23/2018), 0.25 mg (09/25/2018), 0.25 mg (09/27/2018), 0.25 mg (10/28/2018), 0.25 mg (10/30/2018), 0.25 mg (11/01/2018), 0.25 mg (12/02/2018), 0.25 mg (12/04/2018), 0.25 mg (12/06/2018) azaCITIDine (VIDAZA) 100 mg in sodium chloride 0.9 % 50 mL chemo infusion, 110 mg (66.7 % of original dose 75 mg/m2), Intravenous, Once, 4 of 6 cycles Dose modification: 50 mg/m2 (original dose 75 mg/m2, Cycle 1, Reason: Provider Judgment), 50 mg/m2 (original dose 75 mg/m2, Cycle 2, Reason: Provider Judgment) Administration: 100 mg (08/22/2018), 100 mg (08/23/2018), 100 mg (08/26/2018), 100 mg (08/27/2018), 100 mg (08/28/2018), 100 mg (08/29/2018), 100 mg (08/30/2018), 100 mg (10/01/2018), 100 mg (10/02/2018), 100 mg (11/04/2018), 100 mg (11/05/2018), 100 mg (09/23/2018), 100 mg (09/24/2018), 100 mg (09/25/2018), 100 mg (09/26/2018), 100 mg (09/27/2018), 100 mg (10/28/2018), 100 mg (10/29/2018), 100 mg (10/30/2018), 100 mg (10/31/2018), 100 mg (11/01/2018), 100 mg (12/02/2018), 100 mg (12/03/2018), 100 mg (12/04/2018), 100 mg (12/05/2018), 100 mg (12/06/2018)  for chemotherapy treatment.    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  INTERVAL HISTORY:  Richard Wallace, a 76 y.o. male, returns for routine follow-up and consideration for next cycle of chemotherapy. Richard Wallace was last seen on 10/17/2021.  Due for cycle #25 of Decitabine today.   Overall, he tells me he has been feeling pretty well. He reports swelling in his right leg lasting all day and not improving at night for the past week. He also reports swelling in his fingers bilaterally. He has gained 5 lbs since his last visit. He denies n/v/d and infections. He continues to take venetoclax and reports tolerating it well. He continues to take [facility] daily, although he admits he forgot to take it this morning. He reports cramping in his legs bilaterally following exertion. He reports continued knee pain which is helped by Voltaren gel; hydrocodone does not provide much relief for this pain, and his takes it 3 times weekly.   Overall, he feels ready for next cycle of chemo today.   REVIEW OF SYSTEMS:  Review of Systems  Constitutional:  Positive for unexpected weight change (+5 lbs). Negative for appetite change and fatigue.  Cardiovascular:  Positive for leg swelling.  Gastrointestinal:  Negative for diarrhea, nausea and vomiting.  Musculoskeletal:  Positive for arthralgias (knee).  All other systems reviewed and are negative.  PAST MEDICAL/SURGICAL HISTORY:  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   Cancer ([place]) 01-28-2014   skin cancer   CKD (chronic kidney disease), stage III (HCC)    GERD  (gastroesophageal reflux disease)    Heart murmur    NOTED DURING PHYSICAL WHEN HE WAS ENLISTING IN [facility] , DIDNT KNOW UNTIL THAT TIME AND REPORTS , "THATS THE LAST I HEARD ABOUT IT "    History of hypertension    no longer issue   History of kidney stones    History of malignant melanoma of skin    excision top of scalp 2015-- no recurrence   History of urinary retention    post op lumbar fusion surgery 04/ 2016   Hypertension    Kidney dysfunction    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, MONITORED BY [facility] DR Annie Main DAHLSTEDT    Left ureteral calculus    Seasonal allergies    Wears glasses    Wears glasses    Wears partial dentures    upper and lower   Past Surgical History:  Procedure Laterality Date   ANKLE FUSION Right 2007   CARPAL TUNNEL RELEASE Left 12/28/2009   w/ pulley release left long finger   CARPAL TUNNEL RELEASE Right 07/22/2013   Procedure: RIGHT CARPAL TUNNEL RELEASE;  Surgeon: Cammie Sickle., MD;  Location: [facility];  Service: Orthopedics;  Laterality: Right;   COLONOSCOPY     CYSTO/  LEFT RETROGRADE PYELOGRAM  11/21/2010   CYSTOSCOPY WITH STENT PLACEMENT Left 03/09/2016   Procedure: CYSTOSCOPY WITH STENT PLACEMENT;  Surgeon: Franchot Gallo, MD;  Location[facility];  Service: Urology;  Laterality: Left;   CYSTOSCOPY/RETROGRADE/URETEROSCOPY/STONE EXTRACTION WITH BASKET Left 03/09/2016   Procedure: CYSTOSCOPY/RETROGRADE/URETEROSCOPY/STONE EXTRACTION WITH BASKET;  Surgeon: Franchot Gallo, MD;  Location: [facility];  Service: Urology;  Laterality: Left;   LEFT URETEROSCOPIC LASER LITHOTRIPSY STONE EXTRACTION/ STENT PLACEMENT  05/23/2010   MOHS SURGERY     TOP OF THE HEAD    ORIF ANKLE FRACTURE Right 1978   PORT-A-CATH REMOVAL Right 02/14/2019   Procedure: MINOR REMOVAL PORT-A-CATH;  Surgeon: Aviva Signs, MD;  Location: [facility];  Service: General;  Laterality: Right;   PORTACATH PLACEMENT Right  08/19/2018   Procedure: INSERTION PORT-A-CATH (attached catheter in right subclavian);  Surgeon: Aviva Signs, MD;  Location: [facility];  Service: General;  Laterality: Right;   PORTACATH PLACEMENT Left 05/16/2019   Procedure: INSERTION PORT-A-CATH (attached catheter in left subclavian);  Surgeon: Aviva Signs, MD;  Location: [facility];  Service: General;  Laterality: Left;   POSTERIOR LUMBAR FUSION  08/21/2014   laminectomy and decompression L2 -- L5   RIGHT LOWER LEG SURGERY  X3  1975 to 1976   including ORIF   TONSILLECTOMY AND ADENOIDECTOMY  7510   UMBILICAL HERNIA REPAIR  2009 approx    SOCIAL HISTORY:  Social History   Socioeconomic History   Marital status: Married    Spouse name: Not on file   Number of children: Not on file   Years of education: Not on file   Highest education level: Not on file  Occupational History   Not on file  Tobacco Use   Smoking status: Former    Years: 20.00    Types: Cigarettes    Quit date: 07/17/1986    Years since quitting: 35.3   Smokeless tobacco: Never  Vaping Use  Vaping Use: Never used  Substance and Sexual Activity   Alcohol use: Yes    Alcohol/week: 7.0 - 14.0 standard drinks of alcohol    Types: 7 - 14 Cans of beer per week    Comment: 1 -2 beer daily   Drug use: No   Sexual activity: Not Currently  Other Topics Concern   Not on file  Social History Narrative   Not on file   Social Determinants of Health   Financial Resource Strain: Low Risk  (03/23/2020)   Overall Financial Resource Strain (CARDIA)    Difficulty of Paying Living Expenses: Not hard at all  Food Insecurity: No Food Insecurity (03/23/2020)   Hunger Vital Sign    Worried About Running Out of Food in the Last Year: Never true    Ran Out of Food in the Last Year: Never true  Transportation Needs: No Transportation Needs (03/23/2020)   PRAPARE - Hydrologist (Medical): No    Lack of Transportation (Non-Medical): No  Physical  Activity: Unknown (02/17/2019)   Exercise Vital Sign    Days of Exercise per Week: Patient refused    Minutes of Exercise per Session: Patient refused  Stress: No Stress Concern Present (03/23/2020)   [place]    Feeling of Stress : Not at all  Social Connections: Moderately Isolated (03/23/2020)   Social Connection and Isolation Panel [NHANES]    Frequency of Communication with Friends and Family: Twice a week    Frequency of Social Gatherings with Friends and Family: Never    Attends Religious Services: Never    Marine scientist or Organizations: Yes    Attends Music therapist: More than 4 times per year    Marital Status: Married  Human resources officer Violence: Unknown (02/17/2019)   Humiliation, Afraid, Rape, and Kick questionnaire    Fear of Current or Ex-Partner: Patient refused    Emotionally Abused: Patient refused    Physically Abused: Patient refused    Sexually Abused: Patient refused    FAMILY HISTORY:  Family History  Problem Relation Age of Onset   Stroke Mother    Prostate cancer Father    Bone cancer Father    Diverticulitis Father    Rheum arthritis Sister    Urinary tract infection Sister    Colon cancer Neg Hx     CURRENT MEDICATIONS:  Current Outpatient Medications  Medication Sig Dispense Refill   ACETAMINOPHEN EXTRA STRENGTH 500 MG capsule      allopurinol (ZYLOPRIM) 300 MG tablet Take 300 mg by mouth daily.     ALPRAZolam (XANAX) 0.25 MG tablet Take 1 tablet by mouth twice daily as needed for anxiety 60 tablet 0   amLODipine (NORVASC) 2.5 MG tablet Take 1 tablet (2.5 mg total) by mouth daily. 90 tablet 2   cetirizine (ZYRTEC) 10 MG tablet Take 10 mg by mouth daily. IN THE MORNING     diclofenac Sodium (VOLTAREN) 1 % GEL Apply three times daily to knees as needed for pain 50 g 3   docusate sodium (COLACE) 100 MG capsule Take 100 mg by mouth at bedtime.      donepezil  (ARICEPT) 10 MG tablet Take 1 tablet (10 mg total) by mouth at bedtime. 30 tablet 3   HYDROcodone-acetaminophen (NORCO) 10-325 MG tablet Take 1 tablet by mouth every 6 (six) hours as needed. 112 tablet 0   ipratropium (ATROVENT) 0.03 % nasal spray Place  2 sprays into both nostrils 3 (three) times daily. 30 mL 6   lansoprazole (PREVACID) 15 MG capsule Take 15 mg by mouth daily.      magnesium oxide (MAG-OX) 400 (240 Mg) MG tablet TAKE 1 TABLET BY MOUTH THREE TIMES DAILY 90 tablet 3   magnesium oxide (MAG-OX) 400 (241.3 Mg) MG tablet Take 1 tablet (400 mg total) by mouth 3 (three) times daily. 90 tablet 3   Multiple Vitamins-Minerals (CENTRUM SILVER 50+MEN) TABS SMARTSIG:1 By Mouth     SF 5000 PLUS 1.1 % CREA dental cream SMARTSIG:Sparingly By Mouth Every Night     tamsulosin (FLOMAX) 0.4 MG CAPS capsule TAKE 1 CAPSULE BY MOUTH IN THE EVENING 30 capsule 6   tiZANidine (ZANAFLEX) 2 MG tablet Take 2 mg by mouth every 8 (eight) hours as needed.     venetoclax (VENCLEXTA) 100 MG tablet TAKE 2 TABLETS (200 MG) BY MOUTH DAILY 60 tablet 2   lidocaine-prilocaine (EMLA) cream Apply 1 application topically See admin instructions. ONE HOUR PRIOR TO CHEMOTHERAPY APPOINTMENT (Patient not taking: Reported on 11/28/2021) 30 g 6   No current facility-administered medications for this visit.   Facility-Administered Medications Ordered in Other Visits  Medication Dose Route Frequency Provider Last Rate Last Admin   octreotide (SANDOSTATIN LAR) 30 MG IM injection            sodium chloride flush (NS) 0.9 % injection 10 mL  10 mL Intracatheter PRN [name], MD   10 mL at 11/21/19 1030   sodium chloride flush (NS) 0.9 % injection 20 mL  20 mL Intravenous PRN [name], MD   20 mL at 07/21/19 1053    ALLERGIES:  No Known Allergies  PHYSICAL EXAM:  Performance status (ECOG): 1 - Symptomatic but completely ambulatory  Vitals:   11/28/21 1019  BP: (!) 141/79  Pulse: 72  Resp: 16  Temp: 98  F (36.7 C)  SpO2: 94%   Wt Readings from Last 3 Encounters:  11/28/21 224 lb 13.9 oz (102 kg)  10/17/21 219 lb 5.7 oz (99.5 kg)  09/06/21 221 lb 3.2 oz (100.3 kg)   Physical Exam Vitals reviewed.  Constitutional:      Appearance: Normal appearance. He is obese.     Comments: In wheelchair  Cardiovascular:     Rate and Rhythm: Normal rate and regular rhythm.     Pulses: Normal pulses.     Heart sounds: Normal heart sounds.  Pulmonary:     Effort: Pulmonary effort is normal.     Breath sounds: Normal breath sounds.  Musculoskeletal:     Right lower leg: 3+ Edema present.     Left lower leg: Edema (trace) present.  Neurological:     General: No focal deficit present.     Mental Status: He is alert and oriented to person, place, and time.  Psychiatric:        Mood and Affect: Mood normal.        Behavior: Behavior normal.     LABORATORY DATA:  I have reviewed the labs as listed.     Latest Ref Rng & Units 11/28/2021    9:42 AM 10/17/2021    9:45 AM 09/05/2021   10:24 AM  CBC  WBC 4.0 - 10.5 K/uL 4.7  4.2  11.5   Hemoglobin 13.0 - 17.0 g/dL 15.0  14.9  15.5   Hematocrit 39.0 - 52.0 % 45.4  45.6  46.5   Platelets 150 - 400 K/uL 145  136  115       Latest Ref Rng & Units 11/28/2021    9:42 AM 10/17/2021    9:45 AM 09/05/2021   10:24 AM  CMP  Glucose 70 - 99 mg/dL 101  131  98   BUN 8 - 23 mg/dL '20  20  24   '$ Creatinine 0.61 - 1.24 mg/dL 0.88  1.00  0.91   Sodium 135 - 145 mmol/L 140  140  139   Potassium 3.5 - 5.1 mmol/L 4.2  4.5  4.4   Chloride 98 - 111 mmol/L 109  109  108   CO2 22 - 32 mmol/L '25  27  26   '$ Calcium 8.9 - 10.3 mg/dL 9.2  9.5  8.9   Total Protein 6.5 - 8.1 g/dL 6.2  6.2  6.0   Total Bilirubin 0.3 - 1.2 mg/dL 0.7  0.7  0.5   Alkaline Phos 38 - 126 U/L 64  67  65   AST 15 - 41 U/L '25  25  24   '$ ALT 0 - 44 U/L 31  30  47     DIAGNOSTIC IMAGING:  I have independently reviewed the scans and discussed with the patient. No results found.   ASSESSMENT:   1.  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  -BMBX on 02/25/2019 after cycle 1 did not show any evidence of leukemia. -CT CAP on 02/12/2019 shows numerous bilateral irregular/spiculated pulmonary nodules measuring up to 14 mm, nonspecific.  Hepatic steatosis.  Spleen is normal. -I have talked to Dr.Ravandi at [facility] per patient request.  There are clinical trials available upon progression of his AML.  There are also some clinical trials available now based on MRD positivity.  Patient not interested in moving to [place] at this time.   PLAN:  1.  AML: - He has tolerated last cycle very well.  Denied any infections. - Reviewed labs today which showed normal LFTs, LDH.  CBC was grossly normal. - He will proceed with next cycle of decitabine.  He will start venetoclax 200 mg daily for 2 weeks. - RTC 6 weeks for follow-up.   2.  Arthralgias: - Continue hydrocodone 10/325 every 6 hours as needed.   3.  Hypomagnesemia: - Continue magnesium 3 times daily.   4.  Hypertension: - Continue Norvasc 2.5 mg daily.   5.  Dementia: - Continue Aricept 10 mg daily.  6.  Right leg swelling: - He reports worsening right leg swelling for the last 2 weeks with mild soreness. - I have ordered Doppler of the right leg today in the office which showed no evidence of DVT. - We will also start him on Lasix 20 mg in the mornings as needed.   Orders placed this encounter:  No orders of the defined types were placed in this encounter.    [name], MD [facility] [phone]   I, Thana Ates, am acting as a scribe for Dr. [name].  I, [name] MD, have reviewed the above documentation for accuracy and completeness, and I agree with the above.

## 2021-11-29 ENCOUNTER — Encounter (HOSPITAL_COMMUNITY): Payer: Self-pay

## 2021-11-29 ENCOUNTER — Other Ambulatory Visit: Payer: Self-pay

## 2021-11-29 ENCOUNTER — Inpatient Hospital Stay (HOSPITAL_COMMUNITY): Payer: Medicare Other

## 2021-11-29 VITALS — BP 117/79 | HR 61 | Temp 97.8°F | Resp 18

## 2021-11-29 DIAGNOSIS — Z95828 Presence of other vascular implants and grafts: Secondary | ICD-10-CM

## 2021-11-29 DIAGNOSIS — C9200 Acute myeloblastic leukemia, not having achieved remission: Secondary | ICD-10-CM | POA: Diagnosis not present

## 2021-11-29 DIAGNOSIS — D46Z Other myelodysplastic syndromes: Secondary | ICD-10-CM

## 2021-11-29 MED ORDER — ONDANSETRON HCL 4 MG/2ML IJ SOLN
4.0000 mg | Freq: Once | INTRAMUSCULAR | Status: AC
  Administered 2021-11-29: 4 mg via INTRAVENOUS
  Filled 2021-11-29: qty 2

## 2021-11-29 MED ORDER — HEPARIN SOD (PORK) LOCK FLUSH 100 UNIT/ML IV SOLN
500.0000 [IU] | Freq: Once | INTRAVENOUS | Status: AC | PRN
  Administered 2021-11-29: 500 [IU]

## 2021-11-29 MED ORDER — SODIUM CHLORIDE 0.9% FLUSH
10.0000 mL | INTRAVENOUS | Status: DC | PRN
  Administered 2021-11-29: 10 mL

## 2021-11-29 MED ORDER — SODIUM CHLORIDE 0.9 % IV SOLN
15.0000 mg/m2 | Freq: Once | INTRAVENOUS | Status: AC
  Administered 2021-11-29: 35 mg via INTRAVENOUS
  Filled 2021-11-29: qty 7

## 2021-11-29 MED ORDER — SODIUM CHLORIDE 0.9 % IV SOLN: Freq: Once | INTRAVENOUS | Status: AC

## 2021-11-29 NOTE — Progress Notes (Signed)
Patient tolerated chemotherapy with no complaints voiced. Side effects with management reviewed understanding verbalized. Port site clean and dry with no bruising or swelling noted at site. Good blood return noted before and after administration of chemotherapy. Band aid applied. Patient left in satisfactory condition with VSS and no s/s of distress noted. 

## 2021-11-29 NOTE — Patient Instructions (Signed)
Richard Wallace  Discharge Instructions: Thank you for choosing [facility] to provide your oncology and hematology care.  If you have a lab appointment with the [facility], please come in thru the Main Entrance and check in at the main information desk.  Wear comfortable clothing and clothing appropriate for easy access to any Portacath or PICC line.   We strive to give you quality time with your provider. You may need to reschedule your appointment if you arrive late (15 or more minutes).  Arriving late affects you and other patients whose appointments are after yours.  Also, if you miss three or more appointments without notifying the office, you may be dismissed from the clinic at the provider's discretion.      For prescription refill requests, have your pharmacy contact our office and allow 72 hours for refills to be completed.    Today you received the following chemotherapy and/or immunotherapy agents Decitabine, return as scheduled.  Decitabine injection for infusion What is this medication? DECITABINE (dee SYE ta been) is a chemotherapy drug. This medicine reduces the growth of cancer cells. It is used to treat adults with myelodysplastic syndromes. This medicine may be used for other purposes; ask your health care provider or pharmacist if you have questions. COMMON BRAND NAME(S): Dacogen What should I tell my care team before I take this medication? They need to know if you have any of these conditions: infection (especially a virus infection such as chickenpox, cold sores, or herpes) kidney disease liver disease an unusual or allergic reaction to decitabine, other medicines, foods, dyes, or preservatives pregnant or trying to get pregnant breast-feeding How should I use this medication? This medicine is for infusion into a vein. It is administered in a hospital or clinic by a doctor or health care professional. Talk to your pediatrician regarding the  use of this medicine in children. Special care may be needed. Overdosage: If you think you have taken too much of this medicine contact a poison control center or emergency room at once. NOTE: This medicine is only for you. Do not share this medicine with others. What if I miss a dose? It is important not to miss your dose. Call your doctor or health care professional if you are unable to keep an appointment. What may interact with this medication? vaccines Talk to your doctor or health care professional before taking any of these medicines: aspirin acetaminophen ibuprofen ketoprofen naproxen This list may not describe all possible interactions. Give your health care provider a list of all the medicines, herbs, non-prescription drugs, or dietary supplements you use. Also tell them if you smoke, drink alcohol, or use illegal drugs. Some items may interact with your medicine. What should I watch for while using this medication? Visit your doctor for checks on your progress. This drug may make you feel generally unwell. This is not uncommon, as chemotherapy can affect healthy cells as well as cancer cells. Report any side effects. Continue your course of treatment even though you feel ill unless your doctor tells you to stop. You may need blood work done while you are taking this medicine. In some cases, you may be given additional medicines to help with side effects. Follow all directions for their use. Call your doctor or health care professional for advice if you get a fever, chills or sore throat, or other symptoms of a cold or flu. Do not treat yourself. This drug decreases your body's ability to fight  infections. Try to avoid being around people who are sick. This medicine may increase your risk to bruise or bleed. Call your doctor or health care professional if you notice any unusual bleeding. Do not become pregnant while taking this medicine or for 6 months after stopping it. Women should  inform their doctor if they wish to become pregnant or think they might be pregnant. Men should not father a child while taking this medicine and for 3 months after stopping it. There is a potential for serious side effects to an unborn child. Talk to your health care professional or pharmacist for more information. Do not breast-feed an infant while taking this medicine or for at least 2 weeks after stopping it. In males, this medicine may interfere with the ability to father a child. Talk with your doctor or health care professional if you are concerned about your fertility. What side effects may I notice from receiving this medication? Side effects that you should report to your doctor or health care professional as soon as possible: low blood counts - this medicine may decrease the number of white blood cells, red blood cells and platelets. You may be at increased risk for infections and bleeding. signs of infection - fever or chills, cough, sore throat, pain or difficulty passing urine signs of decreased platelets or bleeding - bruising, pinpoint red spots on the skin, black, tarry stools, blood in the urine signs of decreased red blood cells - unusual weakness or tiredness, fainting spells, lightheadedness increased blood sugar Side effects that usually do not require medical attention (report to your doctor or health care professional if they continue or are bothersome): constipation diarrhea headache loss of appetite nausea, vomiting skin rash, itching stomach pain water retention weak or tired This list may not describe all possible side effects. Call your doctor for medical advice about side effects. You may report side effects to FDA at 1-800-FDA-1088. Where should I keep my medication? This drug is given in a hospital or clinic and will not be stored at home. NOTE: This sheet is a summary. It may not cover all possible information. If you have questions about this medicine, talk to  your doctor, pharmacist, or health care provider.  2023 Elsevier/Gold Standard (2018-07-08 00:00:00)  To help prevent nausea and vomiting after your treatment, we encourage you to take your nausea medication as directed.  BELOW ARE SYMPTOMS THAT SHOULD BE REPORTED IMMEDIATELY: *FEVER GREATER THAN 100.4 F (38 C) OR HIGHER *CHILLS OR SWEATING *NAUSEA AND VOMITING THAT IS NOT CONTROLLED WITH YOUR NAUSEA MEDICATION *UNUSUAL SHORTNESS OF BREATH *UNUSUAL BRUISING OR BLEEDING *URINARY PROBLEMS (pain or burning when urinating, or frequent urination) *BOWEL PROBLEMS (unusual diarrhea, constipation, pain near the anus) TENDERNESS IN MOUTH AND THROAT WITH OR WITHOUT PRESENCE OF ULCERS (sore throat, sores in mouth, or a toothache) UNUSUAL RASH, SWELLING OR PAIN  UNUSUAL VAGINAL DISCHARGE OR ITCHING   Items with * indicate a potential emergency and should be followed up as soon as possible or go to the Emergency Department if any problems should occur.  Please show the CHEMOTHERAPY ALERT CARD or IMMUNOTHERAPY ALERT CARD at check-in to the Emergency Department and triage nurse.  Should you have questions after your visit or need to cancel or reschedule your appointment, please contact [facility] [phone]  and follow the prompts.  Office hours are 8:00 a.m. to 4:30 p.m. Monday - Friday. Please note that voicemails left after 4:00 p.m. may not be returned until the following  business day.  We are closed weekends and major holidays. You have access to a nurse at all times for urgent questions. Please call the main number to the clinic [phone] and follow the prompts.  For any non-urgent questions, you may also contact your provider using MyChart. We now offer e-Visits for anyone 46 and older to request care online for non-urgent symptoms. For details visit [email or web address].   Also download the MyChart app! Go to the app store, search "MyChart", open the app, select [facility],  and log in with your MyChart username and password.  Masks are optional in the cancer centers. If you would like for your care team to wear a mask while they are taking care of you, please let them know. For doctor visits, patients may have with them one support person who is at least 77 years old. At this time, visitors are not allowed in the infusion area.

## 2021-11-30 ENCOUNTER — Inpatient Hospital Stay (HOSPITAL_COMMUNITY): Payer: Medicare Other

## 2021-11-30 VITALS — BP 116/80 | HR 66 | Temp 97.0°F | Resp 18

## 2021-11-30 DIAGNOSIS — D46Z Other myelodysplastic syndromes: Secondary | ICD-10-CM

## 2021-11-30 DIAGNOSIS — C9200 Acute myeloblastic leukemia, not having achieved remission: Secondary | ICD-10-CM | POA: Diagnosis not present

## 2021-11-30 DIAGNOSIS — Z95828 Presence of other vascular implants and grafts: Secondary | ICD-10-CM

## 2021-11-30 MED ORDER — SODIUM CHLORIDE 0.9 % IV SOLN: Freq: Once | INTRAVENOUS | Status: AC

## 2021-11-30 MED ORDER — HEPARIN SOD (PORK) LOCK FLUSH 100 UNIT/ML IV SOLN
500.0000 [IU] | Freq: Once | INTRAVENOUS | Status: AC | PRN
  Administered 2021-11-30: 500 [IU]

## 2021-11-30 MED ORDER — SODIUM CHLORIDE 0.9 % IV SOLN
15.0000 mg/m2 | Freq: Once | INTRAVENOUS | Status: AC
  Administered 2021-11-30: 35 mg via INTRAVENOUS
  Filled 2021-11-30: qty 7

## 2021-11-30 MED ORDER — SODIUM CHLORIDE 0.9% FLUSH
10.0000 mL | INTRAVENOUS | Status: DC | PRN
  Administered 2021-11-30: 10 mL

## 2021-11-30 MED ORDER — ONDANSETRON HCL 4 MG/2ML IJ SOLN
4.0000 mg | Freq: Once | INTRAMUSCULAR | Status: AC
  Administered 2021-11-30: 4 mg via INTRAVENOUS
  Filled 2021-11-30: qty 2

## 2021-11-30 NOTE — Progress Notes (Signed)
Patient prosents today for Decitabine infusion per providers order.  Vital signs within parameters for treatment.  Patient has no new complaints at this time.  Decitabine given today per MD orders.  Stable during infusion without adverse affects.  Vital signs stable.  No complaints at this time.  Discharge from clinic ambulatory in stable condition.  Alert and oriented X 3.  Follow up with [facility] as scheduled.

## 2021-11-30 NOTE — Patient Instructions (Signed)
[facility]  Discharge Instructions: Thank you for choosing [facility] to provide your oncology and hematology care.  If you have a lab appointment with the [facility], please come in thru the Main Entrance and check in at the main information desk.  Wear comfortable clothing and clothing appropriate for easy access to any Portacath or PICC line.   We strive to give you quality time with your provider. You may need to reschedule your appointment if you arrive late (15 or more minutes).  Arriving late affects you and other patients whose appointments are after yours.  Also, if you miss three or more appointments without notifying the office, you may be dismissed from the clinic at the provider's discretion.      For prescription refill requests, have your pharmacy contact our office and allow 72 hours for refills to be completed.    Today you received the following chemotherapy and/or immunotherapy agents Decitabine      To help prevent nausea and vomiting after your treatment, we encourage you to take your nausea medication as directed.  BELOW ARE SYMPTOMS THAT SHOULD BE REPORTED IMMEDIATELY: *FEVER GREATER THAN 100.4 F (38 C) OR HIGHER *CHILLS OR SWEATING *NAUSEA AND VOMITING THAT IS NOT CONTROLLED WITH YOUR NAUSEA MEDICATION *UNUSUAL SHORTNESS OF BREATH *UNUSUAL BRUISING OR BLEEDING *URINARY PROBLEMS (pain or burning when urinating, or frequent urination) *BOWEL PROBLEMS (unusual diarrhea, constipation, pain near the anus) TENDERNESS IN MOUTH AND THROAT WITH OR WITHOUT PRESENCE OF ULCERS (sore throat, sores in mouth, or a toothache) UNUSUAL RASH, SWELLING OR PAIN  UNUSUAL VAGINAL DISCHARGE OR ITCHING   Items with * indicate a potential emergency and should be followed up as soon as possible or go to the Emergency Department if any problems should occur.  Please show the CHEMOTHERAPY ALERT CARD or IMMUNOTHERAPY ALERT CARD at check-in to the Emergency  Department and triage nurse.  Should you have questions after your visit or need to cancel or reschedule your appointment, please contact [facility] [phone]  and follow the prompts.  Office hours are 8:00 a.m. to 4:30 p.m. Monday - Friday. Please note that voicemails left after 4:00 p.m. may not be returned until the following business day.  We are closed weekends and major holidays. You have access to a nurse at all times for urgent questions. Please call the main number to the clinic [phone] and follow the prompts.  For any non-urgent questions, you may also contact your provider using MyChart. We now offer e-Visits for anyone 30 and older to request care online for non-urgent symptoms. For details visit [email or web address].   Also download the MyChart app! Go to the app store, search "MyChart", open the app, select [facility], and log in with your MyChart username and password.  Masks are optional in the cancer centers. If you would like for your care team to wear a mask while they are taking care of you, please let them know. For doctor visits, patients may have with them one support person who is at least 77 years old. At this time, visitors are not allowed in the infusion area.

## 2021-12-01 ENCOUNTER — Inpatient Hospital Stay (HOSPITAL_COMMUNITY): Payer: Medicare Other

## 2021-12-01 VITALS — BP 110/71 | HR 77 | Temp 97.8°F | Resp 16

## 2021-12-01 DIAGNOSIS — C9200 Acute myeloblastic leukemia, not having achieved remission: Secondary | ICD-10-CM

## 2021-12-01 DIAGNOSIS — D46Z Other myelodysplastic syndromes: Secondary | ICD-10-CM

## 2021-12-01 DIAGNOSIS — Z95828 Presence of other vascular implants and grafts: Secondary | ICD-10-CM

## 2021-12-01 MED ORDER — SODIUM CHLORIDE 0.9% FLUSH
10.0000 mL | INTRAVENOUS | Status: DC | PRN
  Administered 2021-12-01: 10 mL

## 2021-12-01 MED ORDER — SODIUM CHLORIDE 0.9 % IV SOLN
15.0000 mg/m2 | Freq: Once | INTRAVENOUS | Status: AC
  Administered 2021-12-01: 35 mg via INTRAVENOUS
  Filled 2021-12-01: qty 7

## 2021-12-01 MED ORDER — ONDANSETRON HCL 4 MG/2ML IJ SOLN
4.0000 mg | Freq: Once | INTRAMUSCULAR | Status: AC
  Administered 2021-12-01: 4 mg via INTRAVENOUS
  Filled 2021-12-01: qty 2

## 2021-12-01 MED ORDER — HEPARIN SOD (PORK) LOCK FLUSH 100 UNIT/ML IV SOLN
500.0000 [IU] | Freq: Once | INTRAVENOUS | Status: AC | PRN
  Administered 2021-12-01: 500 [IU]

## 2021-12-01 MED ORDER — SODIUM CHLORIDE 0.9 % IV SOLN: Freq: Once | INTRAVENOUS | Status: AC

## 2021-12-01 NOTE — Patient Instructions (Signed)
[facility]  Discharge Instructions: Thank you for choosing [facility] to provide your oncology and hematology care.  If you have a lab appointment with the [facility], please come in thru the Main Entrance and check in at the main information desk.  Wear comfortable clothing and clothing appropriate for easy access to any Portacath or PICC line.   We strive to give you quality time with your provider. You may need to reschedule your appointment if you arrive late (15 or more minutes).  Arriving late affects you and other patients whose appointments are after yours.  Also, if you miss three or more appointments without notifying the office, you may be dismissed from the clinic at the provider's discretion.      For prescription refill requests, have your pharmacy contact our office and allow 72 hours for refills to be completed.    Today you received the following chemotherapy and/or immunotherapy agents Azacitadine      To help prevent nausea and vomiting after your treatment, we encourage you to take your nausea medication as directed.  BELOW ARE SYMPTOMS THAT SHOULD BE REPORTED IMMEDIATELY: *FEVER GREATER THAN 100.4 F (38 C) OR HIGHER *CHILLS OR SWEATING *NAUSEA AND VOMITING THAT IS NOT CONTROLLED WITH YOUR NAUSEA MEDICATION *UNUSUAL SHORTNESS OF BREATH *UNUSUAL BRUISING OR BLEEDING *URINARY PROBLEMS (pain or burning when urinating, or frequent urination) *BOWEL PROBLEMS (unusual diarrhea, constipation, pain near the anus) TENDERNESS IN MOUTH AND THROAT WITH OR WITHOUT PRESENCE OF ULCERS (sore throat, sores in mouth, or a toothache) UNUSUAL RASH, SWELLING OR PAIN  UNUSUAL VAGINAL DISCHARGE OR ITCHING   Items with * indicate a potential emergency and should be followed up as soon as possible or go to the Emergency Department if any problems should occur.  Please show the CHEMOTHERAPY ALERT CARD or IMMUNOTHERAPY ALERT CARD at check-in to the Emergency  Department and triage nurse.  Should you have questions after your visit or need to cancel or reschedule your appointment, please contact [facility] [phone]  and follow the prompts.  Office hours are 8:00 a.m. to 4:30 p.m. Monday - Friday. Please note that voicemails left after 4:00 p.m. may not be returned until the following business day.  We are closed weekends and major holidays. You have access to a nurse at all times for urgent questions. Please call the main number to the clinic [phone] and follow the prompts.  For any non-urgent questions, you may also contact your provider using MyChart. We now offer e-Visits for anyone 98 and older to request care online for non-urgent symptoms. For details visit [email or web address].   Also download the MyChart app! Go to the app store, search "MyChart", open the app, select [facility], and log in with your MyChart username and password.  Masks are optional in the cancer centers. If you would like for your care team to wear a mask while they are taking care of you, please let them know. For doctor visits, patients may have with them one support person who is at least 77 years old. At this time, visitors are not allowed in the infusion area.

## 2021-12-01 NOTE — Progress Notes (Signed)
Treatment given per orders. Patient tolerated it well without problems. Vitals stable and discharged home from clinic via wheelchair Follow up as scheduled.  

## 2021-12-02 ENCOUNTER — Inpatient Hospital Stay (HOSPITAL_COMMUNITY): Payer: Medicare Other

## 2021-12-02 ENCOUNTER — Encounter (HOSPITAL_COMMUNITY): Payer: Self-pay

## 2021-12-02 VITALS — BP 121/87 | HR 64 | Temp 97.6°F | Resp 18

## 2021-12-02 DIAGNOSIS — D46Z Other myelodysplastic syndromes: Secondary | ICD-10-CM

## 2021-12-02 DIAGNOSIS — Z95828 Presence of other vascular implants and grafts: Secondary | ICD-10-CM

## 2021-12-02 DIAGNOSIS — C9200 Acute myeloblastic leukemia, not having achieved remission: Secondary | ICD-10-CM | POA: Diagnosis not present

## 2021-12-02 MED ORDER — SODIUM CHLORIDE 0.9 % IV SOLN
15.0000 mg/m2 | Freq: Once | INTRAVENOUS | Status: AC
  Administered 2021-12-02: 35 mg via INTRAVENOUS
  Filled 2021-12-02: qty 7

## 2021-12-02 MED ORDER — SODIUM CHLORIDE 0.9% FLUSH
10.0000 mL | INTRAVENOUS | Status: DC | PRN
  Administered 2021-12-02: 10 mL

## 2021-12-02 MED ORDER — HEPARIN SOD (PORK) LOCK FLUSH 100 UNIT/ML IV SOLN
500.0000 [IU] | Freq: Once | INTRAVENOUS | Status: AC | PRN
  Administered 2021-12-02: 500 [IU]

## 2021-12-02 MED ORDER — SODIUM CHLORIDE 0.9 % IV SOLN: Freq: Once | INTRAVENOUS | Status: AC

## 2021-12-02 MED ORDER — ONDANSETRON HCL 4 MG/2ML IJ SOLN
4.0000 mg | Freq: Once | INTRAMUSCULAR | Status: AC
  Administered 2021-12-02: 4 mg via INTRAVENOUS
  Filled 2021-12-02: qty 2

## 2021-12-02 NOTE — Patient Instructions (Signed)
[facility]  Discharge Instructions: Thank you for choosing [facility] to provide your oncology and hematology care.  If you have a lab appointment with the [facility], please come in thru the Main Entrance and check in at the main information desk.  Wear comfortable clothing and clothing appropriate for easy access to any Portacath or PICC line.   We strive to give you quality time with your provider. You may need to reschedule your appointment if you arrive late (15 or more minutes).  Arriving late affects you and other patients whose appointments are after yours.  Also, if you miss three or more appointments without notifying the office, you may be dismissed from the clinic at the provider's discretion.      For prescription refill requests, have your pharmacy contact our office and allow 72 hours for refills to be completed.    Today you received the following chemotherapy and/or immunotherapy agents Decitabine, return as scheduled.  To help prevent nausea and vomiting after your treatment, we encourage you to take your nausea medication as directed.  BELOW ARE SYMPTOMS THAT SHOULD BE REPORTED IMMEDIATELY: *FEVER GREATER THAN 100.4 F (38 C) OR HIGHER *CHILLS OR SWEATING *NAUSEA AND VOMITING THAT IS NOT CONTROLLED WITH YOUR NAUSEA MEDICATION *UNUSUAL SHORTNESS OF BREATH *UNUSUAL BRUISING OR BLEEDING *URINARY PROBLEMS (pain or burning when urinating, or frequent urination) *BOWEL PROBLEMS (unusual diarrhea, constipation, pain near the anus) TENDERNESS IN MOUTH AND THROAT WITH OR WITHOUT PRESENCE OF ULCERS (sore throat, sores in mouth, or a toothache) UNUSUAL RASH, SWELLING OR PAIN  UNUSUAL VAGINAL DISCHARGE OR ITCHING   Items with * indicate a potential emergency and should be followed up as soon as possible or go to the Emergency Department if any problems should occur.  Please show the CHEMOTHERAPY ALERT CARD or IMMUNOTHERAPY ALERT CARD at check-in to  the Emergency Department and triage nurse.  Should you have questions after your visit or need to cancel or reschedule your appointment, please contact [facility] [phone]  and follow the prompts.  Office hours are 8:00 a.m. to 4:30 p.m. Monday - Friday. Please note that voicemails left after 4:00 p.m. may not be returned until the following business day.  We are closed weekends and major holidays. You have access to a nurse at all times for urgent questions. Please call the main number to the clinic [phone] and follow the prompts.  For any non-urgent questions, you may also contact your provider using MyChart. We now offer e-Visits for anyone 77 and older to request care online for non-urgent symptoms. For details visit [email or web address].   Also download the MyChart app! Go to the app store, search "MyChart", open the app, select , and log in with your MyChart username and password.  Masks are optional in the cancer centers. If you would like for your care team to wear a mask while they are taking care of you, please let them know. For doctor visits, patients may have with them one support person who is at least 77 years old. At this time, visitors are not allowed in the infusion area.

## 2021-12-02 NOTE — Progress Notes (Signed)
Patient tolerated chemotherapy with no complaints voiced. Side effects with management reviewed understanding verbalized. Port site clean and dry with no bruising or swelling noted at site. Good blood return noted before and after administration of chemotherapy. Band aid applied. Patient left in satisfactory condition with VSS and no s/s of distress noted. 

## 2021-12-06 ENCOUNTER — Other Ambulatory Visit: Payer: Self-pay

## 2021-12-06 MED ORDER — HYDROCODONE-ACETAMINOPHEN 10-325 MG PO TABS: 1.0000 | ORAL_TABLET | Freq: Four times a day (QID) | ORAL | 0 refills | Status: DC | PRN

## 2021-12-13 ENCOUNTER — Other Ambulatory Visit (HOSPITAL_COMMUNITY): Payer: Self-pay

## 2021-12-30 ENCOUNTER — Other Ambulatory Visit (HOSPITAL_COMMUNITY): Payer: Self-pay | Admitting: Hematology

## 2022-01-02 ENCOUNTER — Encounter: Payer: Self-pay | Admitting: Hematology

## 2022-01-04 ENCOUNTER — Other Ambulatory Visit (HOSPITAL_COMMUNITY): Payer: Self-pay

## 2022-01-06 ENCOUNTER — Other Ambulatory Visit (HOSPITAL_COMMUNITY): Payer: Self-pay | Admitting: Hematology

## 2022-01-06 ENCOUNTER — Other Ambulatory Visit (HOSPITAL_COMMUNITY): Payer: Self-pay

## 2022-01-06 DIAGNOSIS — C9200 Acute myeloblastic leukemia, not having achieved remission: Secondary | ICD-10-CM

## 2022-01-06 MED ORDER — VENETOCLAX 100 MG PO TABS
ORAL_TABLET | ORAL | 2 refills | Status: DC
  Filled 2022-01-06: qty 60, 30d supply, fill #0
  Filled 2022-04-03: qty 60, 30d supply, fill #1
  Filled 2022-06-05: qty 60, 30d supply, fill #2

## 2022-01-08 ENCOUNTER — Other Ambulatory Visit: Payer: Self-pay | Admitting: Hematology

## 2022-01-09 ENCOUNTER — Other Ambulatory Visit: Payer: Self-pay | Admitting: Hematology

## 2022-01-09 DIAGNOSIS — C9200 Acute myeloblastic leukemia, not having achieved remission: Secondary | ICD-10-CM

## 2022-01-09 DIAGNOSIS — Z95828 Presence of other vascular implants and grafts: Secondary | ICD-10-CM

## 2022-01-09 DIAGNOSIS — D46Z Other myelodysplastic syndromes: Secondary | ICD-10-CM

## 2022-01-11 ENCOUNTER — Other Ambulatory Visit (HOSPITAL_COMMUNITY): Payer: Self-pay

## 2022-01-12 ENCOUNTER — Other Ambulatory Visit: Payer: Self-pay

## 2022-01-12 MED ORDER — HYDROCODONE-ACETAMINOPHEN 10-325 MG PO TABS: 1.0000 | ORAL_TABLET | Freq: Four times a day (QID) | ORAL | 0 refills | Status: DC | PRN

## 2022-01-13 ENCOUNTER — Other Ambulatory Visit: Payer: Self-pay

## 2022-01-13 DIAGNOSIS — C9200 Acute myeloblastic leukemia, not having achieved remission: Secondary | ICD-10-CM

## 2022-01-14 ENCOUNTER — Other Ambulatory Visit: Payer: Self-pay

## 2022-01-15 NOTE — Progress Notes (Unsigned)
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[place])   08/22/2018 - 12/06/2018 Chemotherapy   The patient had palonosetron (ALOXI) injection 0.25 mg, 0.25 mg, Intravenous,  Once, 4 of 6 cycles Administration: 0.25 mg (08/22/2018), 0.25 mg (08/26/2018), 0.25 mg (08/28/2018), 0.25 mg (08/30/2018), 0.25 mg (10/01/2018), 0.25 mg (10/02/2018), 0.25 mg (11/04/2018), 0.25 mg (09/23/2018), 0.25 mg (09/25/2018), 0.25 mg (09/27/2018), 0.25 mg (10/28/2018), 0.25 mg (10/30/2018), 0.25 mg (11/01/2018), 0.25 mg (12/02/2018), 0.25 mg (12/04/2018), 0.25 mg (12/06/2018) azaCITIDine (VIDAZA) 100 mg in sodium chloride 0.9 % 50 mL chemo infusion, 110 mg (66.7 % of original dose 75 mg/m2), Intravenous, Once, 4 of 6 cycles Dose modification: 50 mg/m2 (original dose 75 mg/m2, Cycle 1, Reason: Provider Judgment), 50 mg/m2 (original dose 75 mg/m2, Cycle 2, Reason: Provider Judgment) Administration: 100 mg (08/22/2018), 100 mg (08/23/2018), 100 mg (08/26/2018), 100 mg (08/27/2018), 100 mg (08/28/2018), 100 mg (08/29/2018), 100 mg (08/30/2018), 100 mg (10/01/2018), 100 mg (10/02/2018), 100 mg (11/04/2018), 100 mg (11/05/2018), 100 mg (09/23/2018), 100 mg (09/24/2018), 100 mg (09/25/2018), 100 mg (09/26/2018), 100 mg (09/27/2018), 100 mg (10/28/2018), 100 mg (10/29/2018), 100 mg (10/30/2018), 100 mg (10/31/2018), 100 mg (11/01/2018), 100 mg (12/02/2018), 100 mg (12/03/2018), 100 mg (12/04/2018), 100 mg (12/05/2018), 100 mg (12/06/2018)  for chemotherapy treatment.    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  INTERVAL HISTORY:  Richard Wallace, a 76 y.o. male, returns for routine follow-up of his AML with consideration for next cycle of chemotherapy. [name] was last seen on 11/28/2021.   At today's visit, he  reports feeling fairly well.  He reports that he tolerated his last cycle of chemotherapy well.  He has not had any infections.  He denies any fever, chills, night sweats.  His weight has decreased 4 pounds since his last visit, but is overall stable over the past 4 months with some fluctuations +/- 5 pounds.  He has some decreased energy at about 40%.  He continues to have bilateral ankle swelling, right chronically greater than left following right leg surgery 1975.  Continues to take Norvasc.  Taking Lasix 20 mg as needed with little improvement.  Reports abdominal distention and early satiety for the past 6 weeks.  He reports 40% energy and 100% appetite.  He is maintaining stable weight at this time. Overall, he feels ready for the next cycle of  chemo today.   REVIEW OF SYSTEMS:  Review of Systems  Constitutional:  Positive for fatigue. Negative for appetite change, chills, diaphoresis, fever and unexpected weight change.  HENT:   Negative for lump/mass and nosebleeds.   Eyes:  Negative for eye problems.  Respiratory:  Negative for cough, hemoptysis and shortness of breath.   Cardiovascular:   Negative for chest pain, leg swelling and palpitations.  Gastrointestinal:  Positive for abdominal distention. Negative for abdominal pain, blood in stool, constipation, diarrhea, nausea and vomiting.  Genitourinary:  Negative for hematuria.   Musculoskeletal:  Positive for arthralgias.  Skin: Negative.   Neurological:  Negative for dizziness, headaches and light-headedness.  Hematological:  Does not bruise/bleed easily.    PAST MEDICAL/SURGICAL HISTORY:  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   Cancer ([place]) 01-28-2014   skin cancer   CKD (chronic kidney disease), stage III (HCC)    GERD (gastroesophageal reflux disease)    Heart murmur    NOTED DURING PHYSICAL WHEN HE WAS ENLISTING IN [facility] , DIDNT KNOW UNTIL THAT TIME AND REPORTS , "THATS THE LAST I HEARD ABOUT IT "    History of hypertension    no longer issue   History of kidney stones    History of malignant melanoma of skin    excision top of scalp 2015-- no recurrence   History of urinary retention    post op lumbar fusion surgery 04/ 2016   Hypertension    Kidney dysfunction    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, MONITORED BY [facility] DR Annie Main DAHLSTEDT    Left ureteral calculus    Seasonal allergies    Wears glasses    Wears glasses    Wears partial dentures    upper and lower   Past Surgical History:  Procedure Laterality Date   ANKLE FUSION Right 2007   CARPAL TUNNEL RELEASE Left 12/28/2009   w/ pulley release left long finger   CARPAL TUNNEL RELEASE Right 07/22/2013   Procedure: RIGHT CARPAL TUNNEL RELEASE;  Surgeon: Cammie Sickle., MD;  Location: [place];  Service: Orthopedics;  Laterality: Right;   COLONOSCOPY     CYSTO/  LEFT RETROGRADE PYELOGRAM  11/21/2010   CYSTOSCOPY WITH STENT PLACEMENT Left 03/09/2016   Procedure: CYSTOSCOPY WITH STENT PLACEMENT;  Surgeon: Franchot Gallo, MD;  [facility];  Service: Urology;  Laterality: Left;   CYSTOSCOPY/RETROGRADE/URETEROSCOPY/STONE EXTRACTION WITH BASKET Left 03/09/2016   Procedure: CYSTOSCOPY/RETROGRADE/URETEROSCOPY/STONE EXTRACTION WITH BASKET;  Surgeon: Franchot Gallo, MD;  Location: [facility];  Service: Urology;  Laterality: Left;   LEFT URETEROSCOPIC LASER LITHOTRIPSY STONE EXTRACTION/ STENT PLACEMENT  05/23/2010   MOHS SURGERY     TOP OF THE HEAD    ORIF ANKLE FRACTURE Right 1978   PORT-A-CATH REMOVAL Right 02/14/2019   Procedure: MINOR REMOVAL PORT-A-CATH;  Surgeon: Aviva Signs, MD;  Location: [facility];  Service: General;  Laterality: Right;   PORTACATH PLACEMENT Right 08/19/2018   Procedure: INSERTION PORT-A-CATH (attached catheter in right subclavian);  Surgeon: Aviva Signs, MD;  Location: [facility];  Service: General;  Laterality: Right;   PORTACATH PLACEMENT Left 05/16/2019   Procedure: INSERTION PORT-A-CATH (attached catheter in left subclavian);  Surgeon: Aviva Signs, MD;  Location: [facility];  Service: General;  Laterality: Left;   POSTERIOR LUMBAR FUSION  08/21/2014   laminectomy and decompression L2 -- L5   RIGHT LOWER LEG  SURGERY  X3  1975 to 1976   including ORIF   TONSILLECTOMY AND ADENOIDECTOMY  1696   UMBILICAL HERNIA REPAIR  2009 approx    SOCIAL HISTORY:  Social History   Socioeconomic History   Marital status: Married    Spouse name: Not on file   Number of children: Not on file   Years of education: Not on file   Highest education level: Not on file  Occupational History   Not on file  Tobacco Use   Smoking status: Former    Years: 20.00    Types: Cigarettes    Quit date: 07/17/1986    Years since quitting: 35.5   Smokeless tobacco: Never  Vaping Use   Vaping Use: Never used  Substance and Sexual Activity   Alcohol use: Yes    Alcohol/week: 7.0 - 14.0 standard drinks of alcohol    Types: 7 - 14 Cans of beer per week    Comment: 1 -2 beer daily   Drug use: No   Sexual  activity: Not Currently  Other Topics Concern   Not on file  Social History Narrative   Not on file   Social Determinants of Health   Financial Resource Strain: Low Risk  (03/23/2020)   Overall Financial Resource Strain (CARDIA)    Difficulty of Paying Living Expenses: Not hard at all  Food Insecurity: No Food Insecurity (03/23/2020)   Hunger Vital Sign    Worried About Running Out of Food in the Last Year: Never true    Ran Out of Food in the Last Year: Never true  Transportation Needs: No Transportation Needs (03/23/2020)   PRAPARE - Hydrologist (Medical): No    Lack of Transportation (Non-Medical): No  Physical Activity: Unknown (02/17/2019)   Exercise Vital Sign    Days of Exercise per Week: Patient refused    Minutes of Exercise per Session: Patient refused  Stress: No Stress Concern Present (03/23/2020)   [place]    Feeling of Stress : Not at all  Social Connections: Moderately Isolated (03/23/2020)   Social Connection and Isolation Panel [NHANES]    Frequency of Communication with Friends and Family: Twice a week    Frequency of Social Gatherings with Friends and Family: Never    Attends Religious Services: Never    Marine scientist or Organizations: Yes    Attends Music therapist: More than 4 times per year    Marital Status: Married  Human resources officer Violence: Unknown (02/17/2019)   Humiliation, Afraid, Rape, and Kick questionnaire    Fear of Current or Ex-Partner: Patient refused    Emotionally Abused: Patient refused    Physically Abused: Patient refused    Sexually Abused: Patient refused    FAMILY HISTORY:  Family History  Problem Relation Age of Onset   Stroke Mother    Prostate cancer Father    Bone cancer Father    Diverticulitis Father    Rheum arthritis Sister    Urinary tract infection Sister    Colon cancer Neg Hx     CURRENT  MEDICATIONS:  Current Outpatient Medications  Medication Sig Dispense Refill   ACETAMINOPHEN EXTRA STRENGTH 500 MG capsule      allopurinol (ZYLOPRIM) 300 MG tablet Take 300 mg by mouth daily.     ALPRAZolam (XANAX) 0.25 MG tablet Take 1 tablet by mouth twice daily as needed for anxiety 60 tablet 0  amLODipine (NORVASC) 2.5 MG tablet Take 1 tablet (2.5 mg total) by mouth daily. 90 tablet 2   cetirizine (ZYRTEC) 10 MG tablet Take 10 mg by mouth daily. IN THE MORNING     diclofenac Sodium (VOLTAREN) 1 % GEL Apply three times daily to knees as needed for pain 50 g 3   docusate sodium (COLACE) 100 MG capsule Take 100 mg by mouth at bedtime.      donepezil (ARICEPT) 10 MG tablet TAKE 1 TABLET BY MOUTH AT BEDTIME 30 tablet 0   furosemide (LASIX) 20 MG tablet Take 1 tablet (20 mg total) by mouth daily as needed. 30 tablet 2   HYDROcodone-acetaminophen (NORCO) 10-325 MG tablet Take 1 tablet by mouth every 6 (six) hours as needed. 112 tablet 0   ipratropium (ATROVENT) 0.03 % nasal spray Place 2 sprays into both nostrils 3 (three) times daily. 30 mL 6   lansoprazole (PREVACID) 15 MG capsule Take 15 mg by mouth daily.      lidocaine-prilocaine (EMLA) cream Apply 1 application topically See admin instructions. ONE HOUR PRIOR TO CHEMOTHERAPY APPOINTMENT 30 g 6   magnesium oxide (MAG-OX) 400 (240 Mg) MG tablet TAKE 1 TABLET BY MOUTH THREE TIMES DAILY 90 tablet 3   magnesium oxide (MAG-OX) 400 (241.3 Mg) MG tablet Take 1 tablet (400 mg total) by mouth 3 (three) times daily. 90 tablet 3   Multiple Vitamins-Minerals (CENTRUM SILVER 50+MEN) TABS SMARTSIG:1 By Mouth     SF 5000 PLUS 1.1 % CREA dental cream SMARTSIG:Sparingly By Mouth Every Night     tamsulosin (FLOMAX) 0.4 MG CAPS capsule TAKE 1 CAPSULE BY MOUTH IN THE EVENING 30 capsule 6   tiZANidine (ZANAFLEX) 2 MG tablet Take 2 mg by mouth every 8 (eight) hours as needed.     venetoclax (VENCLEXTA) 100 MG tablet TAKE 2 TABLETS (200 MG) BY MOUTH DAILY 60  tablet 2   No current facility-administered medications for this visit.   Facility-Administered Medications Ordered in Other Visits  Medication Dose Route Frequency Provider Last Rate Last Admin   octreotide (SANDOSTATIN LAR) 30 MG IM injection            sodium chloride flush (NS) 0.9 % injection 20 mL  20 mL Intravenous PRN [name], MD   20 mL at 07/21/19 1053    ALLERGIES:  No Known Allergies  PHYSICAL EXAM:  Performance status (ECOG): 2  There were no vitals filed for this visit. Wt Readings from Last 3 Encounters:  11/28/21 224 lb 13.9 oz (102 kg)  10/17/21 219 lb 5.7 oz (99.5 kg)  09/06/21 221 lb 3.2 oz (100.3 kg)   Physical Exam Vitals reviewed.  Constitutional:      Appearance: Normal appearance. He is obese.     Comments: In wheelchair  Cardiovascular:     Rate and Rhythm: Normal rate and regular rhythm.     Pulses: Normal pulses.     Heart sounds: Normal heart sounds.  Pulmonary:     Effort: Pulmonary effort is normal.     Breath sounds: Normal breath sounds.  Musculoskeletal:     Right lower leg: 2+ Edema (2+) present.     Left lower leg: Edema (trace) present.  Neurological:     General: No focal deficit present.     Mental Status: He is alert and oriented to person, place, and time.  Psychiatric:        Mood and Affect: Mood normal.        Behavior: Behavior  normal.      LABORATORY DATA:  I have reviewed the labs as listed.     Latest Ref Rng & Units 11/28/2021    9:42 AM 10/17/2021    9:45 AM 09/05/2021   10:24 AM  CBC  WBC 4.0 - 10.5 K/uL 4.7  4.2  11.5   Hemoglobin 13.0 - 17.0 g/dL 15.0  14.9  15.5   Hematocrit 39.0 - 52.0 % 45.4  45.6  46.5   Platelets 150 - 400 K/uL 145  136  115       Latest Ref Rng & Units 11/28/2021    9:42 AM 10/17/2021    9:45 AM 09/05/2021   10:24 AM  CMP  Glucose 70 - 99 mg/dL 101  131  98   BUN 8 - 23 mg/dL '20  20  24   '$ Creatinine 0.61 - 1.24 mg/dL 0.88  1.00  0.91   Sodium 135 - 145 mmol/L 140  140   139   Potassium 3.5 - 5.1 mmol/L 4.2  4.5  4.4   Chloride 98 - 111 mmol/L 109  109  108   CO2 22 - 32 mmol/L '25  27  26   '$ Calcium 8.9 - 10.3 mg/dL 9.2  9.5  8.9   Total Protein 6.5 - 8.1 g/dL 6.2  6.2  6.0   Total Bilirubin 0.3 - 1.2 mg/dL 0.7  0.7  0.5   Alkaline Phos 38 - 126 U/L 64  67  65   AST 15 - 41 U/L '25  25  24   '$ ALT 0 - 44 U/L 31  30  47     DIAGNOSTIC IMAGING:  I have independently reviewed the scans and discussed with the patient. No results found.   ASSESSMENT & PLAN:  1.  Acute myeloid leukemia: - 8 cycles of decitabine (every 6 weeks) and venetoclax 200 mg (for 2 weeks) from 01/20/2019 through 11/17/2019.  - BMBX on 02/25/2019 after cycle 1 did not show any evidence of leukemia. - CT CAP on 02/12/2019 shows numerous bilateral irregular/spiculated pulmonary nodules measuring up to 14 mm, nonspecific.  Hepatic steatosis.  Spleen is normal. - Dr. Delton Coombes talked to Dr.Ravandi at [facility] per patient request.  There are clinical trials available upon progression of his AML.  There are also some clinical trials available now based on MRD positivity.  Patient not interested in moving to [place] at this time. - He has tolerated last cycle very well.  Denied any infections. - Reviewed labs today which showed normal LFTs, LDH.  CBC was grossly normal, chronic thrombocytopenia at baseline. - PLAN: Proceed with next cycle of decitabine.  He will start venetoclax 200 mg daily for 2 weeks. - RTC in 6 weeks for labs, MD visit, and next cycle of treatment.  2.  Abdominal distention - Patient reports abdominal distention and early satiety for the past 6 weeks - Previous abdominal imaging in 2020 showed fatty liver disease - PLAN: We will check abdominal ultrasound to evaluate for any progressive liver disease or ascites.   3.  Arthralgias: - Continue hydrocodone 10/325 every 6 hours as needed.   4.  Hypomagnesemia: - Continue magnesium 3 times daily.   5.  Hypertension: -  Continue Norvasc 2.5 mg daily.   6.  Dementia: - Continue Aricept 10 mg daily.  7.  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   All questions were answered. The patient knows to call the clinic with any problems, questions or concerns.  Medical decision making: Moderate  Time spent on visit: I spent 20 minutes counseling the patient face to face. The total time spent in the appointment was 30 minutes and more than 50% was on counseling.   Harriett Rush, PA-C  01/16/22 12:07 PM

## 2022-01-16 ENCOUNTER — Inpatient Hospital Stay (HOSPITAL_BASED_OUTPATIENT_CLINIC_OR_DEPARTMENT_OTHER): Payer: Medicare Other | Admitting: Physician Assistant

## 2022-01-16 ENCOUNTER — Inpatient Hospital Stay: Payer: Medicare Other

## 2022-01-16 ENCOUNTER — Inpatient Hospital Stay: Payer: Medicare Other | Attending: Hematology

## 2022-01-16 ENCOUNTER — Other Ambulatory Visit (HOSPITAL_COMMUNITY): Payer: Self-pay | Admitting: Hematology

## 2022-01-16 VITALS — BP 147/82 | HR 58 | Temp 97.7°F | Resp 17

## 2022-01-16 VITALS — BP 124/80 | HR 67 | Temp 97.5°F | Resp 16 | Wt 220.5 lb

## 2022-01-16 DIAGNOSIS — D46Z Other myelodysplastic syndromes: Secondary | ICD-10-CM | POA: Diagnosis not present

## 2022-01-16 DIAGNOSIS — C9200 Acute myeloblastic leukemia, not having achieved remission: Secondary | ICD-10-CM | POA: Diagnosis present

## 2022-01-16 DIAGNOSIS — Z79899 Other long term (current) drug therapy: Secondary | ICD-10-CM | POA: Diagnosis not present

## 2022-01-16 DIAGNOSIS — D696 Thrombocytopenia, unspecified: Secondary | ICD-10-CM | POA: Diagnosis not present

## 2022-01-16 DIAGNOSIS — K760 Fatty (change of) liver, not elsewhere classified: Secondary | ICD-10-CM | POA: Insufficient documentation

## 2022-01-16 DIAGNOSIS — Z95828 Presence of other vascular implants and grafts: Secondary | ICD-10-CM

## 2022-01-16 DIAGNOSIS — N183 Chronic kidney disease, stage 3 unspecified: Secondary | ICD-10-CM | POA: Insufficient documentation

## 2022-01-16 DIAGNOSIS — I129 Hypertensive chronic kidney disease with stage 1 through stage 4 chronic kidney disease, or unspecified chronic kidney disease: Secondary | ICD-10-CM | POA: Insufficient documentation

## 2022-01-16 DIAGNOSIS — R918 Other nonspecific abnormal finding of lung field: Secondary | ICD-10-CM | POA: Diagnosis not present

## 2022-01-16 DIAGNOSIS — F0390 Unspecified dementia without behavioral disturbance: Secondary | ICD-10-CM | POA: Insufficient documentation

## 2022-01-16 LAB — COMPREHENSIVE METABOLIC PANEL
ALT: 29 U/L (ref 0–44)
AST: 22 U/L (ref 15–41)
Albumin: 3.8 g/dL (ref 3.5–5.0)
Alkaline Phosphatase: 60 U/L (ref 38–126)
Anion gap: 8 (ref 5–15)
BUN: 23 mg/dL (ref 8–23)
CO2: 25 mmol/L (ref 22–32)
Calcium: 9.4 mg/dL (ref 8.9–10.3)
Chloride: 109 mmol/L (ref 98–111)
Creatinine, Ser: 0.92 mg/dL (ref 0.61–1.24)
GFR, Estimated: >60 mL/min (ref >60)
Glucose, Bld: 109 mg/dL — ABNORMAL HIGH (ref 70–99)
Potassium: 4.2 mmol/L (ref 3.5–5.1)
Sodium: 142 mmol/L (ref 135–145)
Total Bilirubin: 0.7 mg/dL (ref 0.3–1.2)
Total Protein: 6.4 g/dL — ABNORMAL LOW (ref 6.5–8.1)

## 2022-01-16 LAB — CBC WITH DIFFERENTIAL/PLATELET
Abs Immature Granulocytes: 0.09 10*3/uL — ABNORMAL HIGH (ref 0.00–0.07)
Basophils Absolute: 0 10*3/uL (ref 0.0–0.1)
Basophils Relative: 1 %
Eosinophils Absolute: 0.2 10*3/uL (ref 0.0–0.5)
Eosinophils Relative: 3 %
HCT: 46.5 % (ref 39.0–52.0)
Hemoglobin: 15.5 g/dL (ref 13.0–17.0)
Immature Granulocytes: 2 %
Lymphocytes Relative: 19 %
Lymphs Abs: 1.1 10*3/uL (ref 0.7–4.0)
MCH: 31.6 pg (ref 26.0–34.0)
MCHC: 33.3 g/dL (ref 30.0–36.0)
MCV: 94.7 fL (ref 80.0–100.0)
Monocytes Absolute: 0.5 10*3/uL (ref 0.1–1.0)
Monocytes Relative: 9 %
Neutro Abs: 3.7 10*3/uL (ref 1.7–7.7)
Neutrophils Relative %: 66 %
Platelets: 124 10*3/uL — ABNORMAL LOW (ref 150–400)
RBC: 4.91 MIL/uL (ref 4.22–5.81)
RDW: 15.1 % (ref 11.5–15.5)
WBC: 5.5 10*3/uL (ref 4.0–10.5)
nRBC: 0 % (ref 0.0–0.2)

## 2022-01-16 LAB — MAGNESIUM: Magnesium: 2 mg/dL (ref 1.7–2.4)

## 2022-01-16 MED ORDER — SODIUM CHLORIDE 0.9% FLUSH
10.0000 mL | INTRAVENOUS | Status: DC | PRN
  Administered 2022-01-16: 10 mL

## 2022-01-16 MED ORDER — ONDANSETRON HCL 4 MG/2ML IJ SOLN
4.0000 mg | Freq: Once | INTRAMUSCULAR | Status: AC
  Administered 2022-01-16: 4 mg via INTRAVENOUS
  Filled 2022-01-16: qty 2

## 2022-01-16 MED ORDER — HEPARIN SOD (PORK) LOCK FLUSH 100 UNIT/ML IV SOLN
500.0000 [IU] | Freq: Once | INTRAVENOUS | Status: AC | PRN
  Administered 2022-01-16: 500 [IU]

## 2022-01-16 MED ORDER — SODIUM CHLORIDE 0.9 % IV SOLN: Freq: Once | INTRAVENOUS | Status: AC

## 2022-01-16 MED ORDER — SODIUM CHLORIDE 0.9 % IV SOLN
15.0000 mg/m2 | Freq: Once | INTRAVENOUS | Status: AC
  Administered 2022-01-16: 35 mg via INTRAVENOUS
  Filled 2022-01-16: qty 7

## 2022-01-16 NOTE — Patient Instructions (Signed)
[facility]  Discharge Instructions: Thank you for choosing [facility] to provide your oncology and hematology care.  If you have a lab appointment with the Cancer Center, please come in thru the Main Entrance and check in at the main information desk.  Wear comfortable clothing and clothing appropriate for easy access to any Portacath or PICC line.   We strive to give you quality time with your provider. You may need to reschedule your appointment if you arrive late (15 or more minutes).  Arriving late affects you and other patients whose appointments are after yours.  Also, if you miss three or more appointments without notifying the office, you may be dismissed from the clinic at the provider's discretion.      For prescription refill requests, have your pharmacy contact our office and allow 72 hours for refills to be completed.    Today you received the following chemotherapy and/or immunotherapy agents Decitabine. Decitabine; Cedazuridine Tablets What is this medication? DECITABINE; CEDAZURIDINE (dee SYE ta been; sed az ure i deen) treats blood and bone marrow cancers. It works by slowing down the growth of cancer cells. This medicine may be used for other purposes; ask your health care provider or pharmacist if you have questions. COMMON BRAND NAME(S): INQOVI What should I tell my care team before I take this medication? They need to know if you have any of these conditions: Infection Kidney disease Liver disease An unusual or allergic reaction to decitabine, cedazuridine, other medications, foods, dyes, or preservatives Pregnant or trying to get pregnant Breast-feeding How should I use this medication? Take this medication by mouth. Take it as directed on the prescription label at the same time every day. Do not cut, crush, or chew this medication. Swallow the tablets whole. Take it on an empty stomach, at least 2 hours before or 2 hours after  food. Do not take it more often than directed. Your care team may change your dose or tell you stop taking this medication if you get side effects. Do not change your dose or stop taking it unless your care team tells you to. This medication is taken in "cycles." There will be days you do not take it. Talk to your care team if you have questions about when to take your medication. It is very important to follow the exact schedule. Taking it more often than directed can cause serious side effects. Talk to your care team about the use of this medication in children. Special care may be needed. Overdosage: If you think you have taken too much of this medicine contact a poison control center or emergency room at once. NOTE: This medicine is only for you. Do not share this medicine with others. What if I miss a dose? If you miss a dose, take it as soon as you can unless it is more than 12 hours late. If it is more than 12 hours late, skip the missed dose. Take the next dose at the normal time. Do not take double or extra doses. What may interact with this medication? This medication may affect how other medications work, and other medications may affect the way this medication works. Talk with your care team about all of the medications you take. They may suggest changes to your treatment plan to lower the risk of side effects and to make sure your medications work as intended. This list may not describe all possible interactions. Give your health care provider a   list of all the medicines, herbs, non-prescription drugs, or dietary supplements you use. Also tell them if you smoke, drink alcohol, or use illegal drugs. Some items may interact with your medicine. What should I watch for while using this medication? Visit your care team for regular checks on your progress. You may need blood work while taking this medication. This medication may make you feel generally unwell. This is not uncommon as chemotherapy  can affect healthy cells as well as cancer cells. Report any side effects. Continue your course of treatment even though you feel ill unless your care team tells you to stop. This medication may increase your risk of getting an infection. Call your care team for advice if you get a fever, chills, sore throat, or other symptoms of a cold or flu. Do not treat yourself. Try to avoid being around people who are sick. Be careful brushing or flossing your teeth or using a toothpick because you may get an infection or bleed more easily. If you have any dental work done, tell your dentist you are receiving this medication. Call your care team if you are around anyone with measles, chickenpox, or if you develop sores or blisters that do not heal properly. Avoid taking medications that contain aspirin, acetaminophen, ibuprofen, naproxen, or ketoprofen unless instructed by your care team. These medications may hide a fever. Talk to your care team if you or your partner wish to become pregnant or think either of you might be pregnant. This medication can cause serious birth defects if taken during pregnancy or for 6 months after stopping therapy. A negative pregnancy test is required before starting this medication. A reliable form of contraception is recommended while taking this medication and for 6 months after stopping therapy. Talk to your care team about reliable forms of contraception. Use a condom during sex and for 3 months after stopping therapy. Tell your care team right away if you think your partner might be pregnant. This medication can cause serious birth defects. Do not breast-feed while taking this medication and for 2 weeks after stopping therapy. This medication may cause infertility. Talk to your care team if you are concerned about your fertility. What side effects may I notice from receiving this medication? Side effects that you should report to your care team as soon as possible: Allergic  reactions--skin rash, itching, hives, swelling of the face, lips, tongue, or throat Infection--fever, chills, cough, sore throat, wounds that don't heal, pain or trouble when passing urine, general feeling of discomfort or being unwell Low red blood cell level--unusual weakness or fatigue, dizziness, headache, trouble breathing Unusual bruising or bleeding Side effects that usually do not require medical attention (report to your care team if they continue or are bothersome): Constipation Diarrhea Fatigue Nausea Pain, redness, or swelling with sores inside the mouth or throat Stomach pain This list may not describe all possible side effects. Call your doctor for medical advice about side effects. You may report side effects to FDA at 1-800-FDA-1088. Where should I keep my medication? Keep out of the reach of children and pets. Store between 20 and 25 degrees C (68 and 77 degrees F). Get rid of any unused medication after the expiration date. To get rid of medications that are no longer needed or have expired: Take the medication to a medication take-back program. Check with your pharmacy or law enforcement to find a location. If you cannot return the medication, ask your pharmacist or care team how to get   rid of this medication safely. NOTE: This sheet is a summary. It may not cover all possible information. If you have questions about this medicine, talk to your doctor, pharmacist, or health care provider.  2023 Elsevier/Gold Standard (2021-08-01 00:00:00)       To help prevent nausea and vomiting after your treatment, we encourage you to take your nausea medication as directed.  BELOW ARE SYMPTOMS THAT SHOULD BE REPORTED IMMEDIATELY: *FEVER GREATER THAN 100.4 F (38 C) OR HIGHER *CHILLS OR SWEATING *NAUSEA AND VOMITING THAT IS NOT CONTROLLED WITH YOUR NAUSEA MEDICATION *UNUSUAL SHORTNESS OF BREATH *UNUSUAL BRUISING OR BLEEDING *URINARY PROBLEMS (pain or burning when urinating, or  frequent urination) *BOWEL PROBLEMS (unusual diarrhea, constipation, pain near the anus) TENDERNESS IN MOUTH AND THROAT WITH OR WITHOUT PRESENCE OF ULCERS (sore throat, sores in mouth, or a toothache) UNUSUAL RASH, SWELLING OR PAIN  UNUSUAL VAGINAL DISCHARGE OR ITCHING   Items with * indicate a potential emergency and should be followed up as soon as possible or go to the Emergency Department if any problems should occur.  Please show the CHEMOTHERAPY ALERT CARD or IMMUNOTHERAPY ALERT CARD at check-in to the Emergency Department and triage nurse.  Should you have questions after your visit or need to cancel or reschedule your appointment, please contact [facility] AT  [phone]  and follow the prompts.  Office hours are 8:00 a.m. to 4:30 p.m. Monday - Friday. Please note that voicemails left after 4:00 p.m. may not be returned until the following business day.  We are closed weekends and major holidays. You have access to a nurse at all times for urgent questions. Please call the main number to the clinic [phone] and follow the prompts.  For any non-urgent questions, you may also contact your provider using MyChart. We now offer e-Visits for anyone 18 and older to request care online for non-urgent symptoms. For details visit [email or web address].   Also download the MyChart app! Go to the app store, search "MyChart", open the app, select [facility], and log in with your MyChart username and password.  Masks are optional in the cancer centers. If you would like for your care team to wear a mask while they are taking care of you, please let them know. You may have one support person who is at least 77 years old accompany you for your appointments.  

## 2022-01-16 NOTE — Progress Notes (Signed)
Patient presents today for treatment and follow up visit with R. Pennington PA. Labs reviewed and within parameters for treatment. Vital signs within parameters for treatment. Pre-treatment check list complete.   Venetoclax taking as prescribed with no complaints of any side effects related per patient's words.   Treatment given today per MD orders. Tolerated infusion without adverse affects. Vital signs stable. No complaints at this time. Discharged from clinic ambulatory in stable condition. Alert and oriented x 3. F/U with [facility] as scheduled.

## 2022-01-16 NOTE — Patient Instructions (Signed)
[facility] **   You were seen today by Tarri Abernethy PA-C for your follow-up visit.    We will check abdominal ultrasound due to your abdominal distention and decreased appetite.  We will proceed with her next cycle of chemotherapy.  Take venetoclax 200 mg daily for the next 2 weeks.  Next labs and treatment will be in 6 weeks, along with office visit with Dr. Delton Coombes.  ** Thank you for trusting me with your healthcare!  I strive to provide all of my patients with quality care at each visit.  If you receive a survey for this visit, I would be so grateful to you for taking the time to provide feedback.  Thank you in advance!  ~ Marquesa Rath                   Dr. [name]   &   Tarri Abernethy, PA-C   - - - - - - - - - - - - - - - - - -    Thank you for choosing [facility] to provide your oncology and hematology care.  To afford each patient quality time with our provider, please arrive at least 15 minutes before your scheduled appointment time.   If you have a lab appointment with the [facility] please come in thru the Main Entrance and check in at the main information desk.  You need to re-schedule your appointment should you arrive 10 or more minutes late.  We strive to give you quality time with our providers, and arriving late affects you and other patients whose appointments are after yours.  Also, if you no show three or more times for appointments you may be dismissed from the clinic at the providers discretion.     Again, thank you for choosing [facility].  Our hope is that these requests will decrease the amount of time that you wait before being seen by our physicians.       _____________________________________________________________  Should you have questions after your visit to [facility], please contact our office  at [phone] and follow the prompts.  Our office hours are 8:00 a.m. and 4:30 p.m. Monday - Friday.  Please note that voicemails left after 4:00 p.m. may not be returned until the following business day.  We are closed weekends and major holidays.  You do have access to a nurse 24-7, just call the main number to the clinic [phone] and do not press any options, hold on the line and a nurse will answer the phone.    For prescription refill requests, have your pharmacy contact our office and allow 72 hours.

## 2022-01-16 NOTE — Progress Notes (Signed)
Patient presents today for chemotherapy treatment.  Patient is in satisfactory condition with no new complaints voiced.  Vital signs are stable.  Labs reviewed by Tarri Abernethy, PA-C during his office visit.  All labs are within treatment parameters. We will proceed with treatment per provider orders.

## 2022-01-17 ENCOUNTER — Inpatient Hospital Stay: Payer: Medicare Other

## 2022-01-17 ENCOUNTER — Encounter: Payer: Self-pay | Admitting: Hematology

## 2022-01-17 VITALS — BP 130/83 | HR 59 | Temp 97.2°F | Resp 18

## 2022-01-17 DIAGNOSIS — D46Z Other myelodysplastic syndromes: Secondary | ICD-10-CM

## 2022-01-17 DIAGNOSIS — Z95828 Presence of other vascular implants and grafts: Secondary | ICD-10-CM

## 2022-01-17 DIAGNOSIS — C9200 Acute myeloblastic leukemia, not having achieved remission: Secondary | ICD-10-CM | POA: Diagnosis not present

## 2022-01-17 MED ORDER — HEPARIN SOD (PORK) LOCK FLUSH 100 UNIT/ML IV SOLN
500.0000 [IU] | Freq: Once | INTRAVENOUS | Status: AC | PRN
  Administered 2022-01-17: 500 [IU]

## 2022-01-17 MED ORDER — ONDANSETRON HCL 4 MG/2ML IJ SOLN
4.0000 mg | Freq: Once | INTRAMUSCULAR | Status: AC
  Administered 2022-01-17: 4 mg via INTRAVENOUS
  Filled 2022-01-17: qty 2

## 2022-01-17 MED ORDER — SODIUM CHLORIDE 0.9% FLUSH
10.0000 mL | INTRAVENOUS | Status: DC | PRN
  Administered 2022-01-17: 10 mL

## 2022-01-17 MED ORDER — SODIUM CHLORIDE 0.9 % IV SOLN: Freq: Once | INTRAVENOUS | Status: AC

## 2022-01-17 MED ORDER — SODIUM CHLORIDE 0.9 % IV SOLN
15.0000 mg/m2 | Freq: Once | INTRAVENOUS | Status: AC
  Administered 2022-01-17: 35 mg via INTRAVENOUS
  Filled 2022-01-17: qty 7

## 2022-01-17 NOTE — Progress Notes (Signed)
Treatment given today per MD orders. Tolerated infusion without adverse affects. Vital signs stable. No complaints at this time. Discharged from clinic by motorized scooter in stable condition. Alert and oriented x 3. F/U with [facility] as scheduled.

## 2022-01-17 NOTE — Patient Instructions (Signed)
[facility]  Discharge Instructions: Thank you for choosing [facility] to provide your oncology and hematology care.  If you have a lab appointment with the Cancer Center, please come in thru the Main Entrance and check in at the main information desk.  Wear comfortable clothing and clothing appropriate for easy access to any Portacath or PICC line.   We strive to give you quality time with your provider. You may need to reschedule your appointment if you arrive late (15 or more minutes).  Arriving late affects you and other patients whose appointments are after yours.  Also, if you miss three or more appointments without notifying the office, you may be dismissed from the clinic at the provider's discretion.      For prescription refill requests, have your pharmacy contact our office and allow 72 hours for refills to be completed.    Today you received the following chemotherapy and/or immunotherapy agents Decitabine.  Decitabine Injection What is this medication? DECITABINE (dee SYE ta been) treats blood and bone marrow cancers. It works by slowing down the growth of cancer cells. This medicine may be used for other purposes; ask your health care provider or pharmacist if you have questions. COMMON BRAND NAME(S): Dacogen What should I tell my care team before I take this medication? They need to know if you have any of these conditions: Infection Kidney disease Liver disease An unusual or allergic reaction to decitabine, other medications, foods, dyes, or preservatives Pregnant or trying to get pregnant Breast-feeding How should I use this medication? This medication is infused into a vein. It is given by your care team in a hospital or clinic setting. Talk to your care team about the use of this medication in children. Special care may be needed. Overdosage: If you think you have taken too much of this medicine contact a poison control center or  emergency room at once. NOTE: This medicine is only for you. Do not share this medicine with others. What if I miss a dose? Keep appointments for follow-up doses. It is important not to miss your dose. Call your care team if you are unable to keep an appointment. What may interact with this medication? Interactions are not expected. This list may not describe all possible interactions. Give your health care provider a list of all the medicines, herbs, non-prescription drugs, or dietary supplements you use. Also tell them if you smoke, drink alcohol, or use illegal drugs. Some items may interact with your medicine. What should I watch for while using this medication? Visit your care team for regular checks on your progress. You may need blood work while taking this medication. This medication may make you feel generally unwell. This is not uncommon as chemotherapy can affect healthy cells as well as cancer cells. Report any side effects. Continue your course of treatment even though you feel ill unless your care team tells you to stop. This medication may increase your risk of getting an infection. Call your care team for advice if you get a fever, chills, sore throat, or other symptoms of a cold or flu. Do not treat yourself. Try to avoid being around people who are sick. Be careful brushing or flossing your teeth or using a toothpick because you may get an infection or bleed more easily. If you have any dental work done, tell your dentist you are receiving this medication. Call your care team if you are around anyone with measles, chickenpox, or   if you develop sores or blisters that do not heal properly. Avoid taking medications that contain aspirin, acetaminophen, ibuprofen, naproxen, or ketoprofen unless instructed by your care team. These medications may hide a fever. Talk to your care team if you or your partner wish to become pregnant or think either of you might be pregnant. This medication can  cause serious birth defects if taken during pregnancy or for 6 months after the last dose. A negative pregnancy test is required before starting this medication. A reliable form of contraception is recommended while taking this medication and for 6 months after the last dose. Talk to your care team about reliable forms of contraception. Do not father a child while taking this medication or for 3 months after the last dose. Use a condom while having sex during this time period. Do not breast-feed while taking this medication and for 2 weeks after the last dose. This medication may cause infertility. Talk to your care team if you are concerned about your fertility. What side effects may I notice from receiving this medication? Side effects that you should report to your care team as soon as possible: Allergic reactions--skin rash, itching, hives, swelling of the face, lips, tongue, or throat Infection--fever, chills, cough, sore throat, wounds that don't heal, pain or trouble when passing urine, general feeling of discomfort or being unwell Low red blood cell level--unusual weakness or fatigue, dizziness, headache, trouble breathing Unusual bruising or bleeding Side effects that usually do not require medical attention (report to your care team if they continue or are bothersome): Constipation Diarrhea Fatigue Nausea Pain, redness, or swelling with sores inside the mouth or throat Stomach pain This list may not describe all possible side effects. Call your doctor for medical advice about side effects. You may report side effects to FDA at 1-800-FDA-1088. Where should I keep my medication? This medication is given in a hospital or clinic. It will not be stored at home. NOTE: This sheet is a summary. It may not cover all possible information. If you have questions about this medicine, talk to your doctor, pharmacist, or health care provider.  2023 Elsevier/Gold Standard (2021-08-01 00:00:00)         To help prevent nausea and vomiting after your treatment, we encourage you to take your nausea medication as directed.  BELOW ARE SYMPTOMS THAT SHOULD BE REPORTED IMMEDIATELY: *FEVER GREATER THAN 100.4 F (38 C) OR HIGHER *CHILLS OR SWEATING *NAUSEA AND VOMITING THAT IS NOT CONTROLLED WITH YOUR NAUSEA MEDICATION *UNUSUAL SHORTNESS OF BREATH *UNUSUAL BRUISING OR BLEEDING *URINARY PROBLEMS (pain or burning when urinating, or frequent urination) *BOWEL PROBLEMS (unusual diarrhea, constipation, pain near the anus) TENDERNESS IN MOUTH AND THROAT WITH OR WITHOUT PRESENCE OF ULCERS (sore throat, sores in mouth, or a toothache) UNUSUAL RASH, SWELLING OR PAIN  UNUSUAL VAGINAL DISCHARGE OR ITCHING   Items with * indicate a potential emergency and should be followed up as soon as possible or go to the Emergency Department if any problems should occur.  Please show the CHEMOTHERAPY ALERT CARD or IMMUNOTHERAPY ALERT CARD at check-in to the Emergency Department and triage nurse.  Should you have questions after your visit or need to cancel or reschedule your appointment, please contact [facility] [phone]  and follow the prompts.  Office hours are 8:00 a.m. to 4:30 p.m. Monday - Friday. Please note that voicemails left after 4:00 p.m. may not be returned until the following business day.  We are closed weekends and major   holidays. You have access to a nurse at all times for urgent questions. Please call the main number to the clinic [phone] and follow the prompts.  For any non-urgent questions, you may also contact your provider using MyChart. We now offer e-Visits for anyone 18 and older to request care online for non-urgent symptoms. For details visit [email or web address].   Also download the MyChart app! Go to the app store, search "MyChart", open the app, select , and log in with your MyChart username and password.  Masks are optional in the cancer  centers. If you would like for your care team to wear a mask while they are taking care of you, please let them know. You may have one support person who is at least 77 years old accompany you for your appointments.  

## 2022-01-18 ENCOUNTER — Inpatient Hospital Stay: Payer: Medicare Other

## 2022-01-18 VITALS — BP 141/89 | HR 64 | Temp 98.5°F | Resp 20

## 2022-01-18 DIAGNOSIS — Z95828 Presence of other vascular implants and grafts: Secondary | ICD-10-CM

## 2022-01-18 DIAGNOSIS — C9200 Acute myeloblastic leukemia, not having achieved remission: Secondary | ICD-10-CM | POA: Diagnosis not present

## 2022-01-18 DIAGNOSIS — D46Z Other myelodysplastic syndromes: Secondary | ICD-10-CM

## 2022-01-18 MED ORDER — ONDANSETRON HCL 4 MG/2ML IJ SOLN
4.0000 mg | Freq: Once | INTRAMUSCULAR | Status: AC
  Administered 2022-01-18: 4 mg via INTRAVENOUS
  Filled 2022-01-18: qty 2

## 2022-01-18 MED ORDER — SODIUM CHLORIDE 0.9 % IV SOLN: Freq: Once | INTRAVENOUS | Status: AC

## 2022-01-18 MED ORDER — SODIUM CHLORIDE 0.9 % IV SOLN
15.0000 mg/m2 | Freq: Once | INTRAVENOUS | Status: AC
  Administered 2022-01-18: 35 mg via INTRAVENOUS
  Filled 2022-01-18: qty 7

## 2022-01-18 MED ORDER — HEPARIN SOD (PORK) LOCK FLUSH 100 UNIT/ML IV SOLN
500.0000 [IU] | Freq: Once | INTRAVENOUS | Status: AC | PRN
  Administered 2022-01-18: 500 [IU]

## 2022-01-18 MED ORDER — SODIUM CHLORIDE 0.9% FLUSH
10.0000 mL | INTRAVENOUS | Status: DC | PRN
  Administered 2022-01-18: 10 mL

## 2022-01-18 NOTE — Progress Notes (Signed)
Patient presents today for Decitabine infusion per provider order.  Vital signs within parameters for treatment.  Patient has no new complaints at this time.  Treatment given today per MD orders.  Stable during infusion without adverse affects.  Vital signs stable.  No complaints at this time.  Discharge from clinic ambulatory in stable condition.  Alert and oriented X 3.  Follow up with [facility] as scheduled.

## 2022-01-18 NOTE — Patient Instructions (Signed)
[facility]  Discharge Instructions: Thank you for choosing [facility] to provide your oncology and hematology care.  If you have a lab appointment with the Cancer Center, please come in thru the Main Entrance and check in at the main information desk.  Wear comfortable clothing and clothing appropriate for easy access to any Portacath or PICC line.   We strive to give you quality time with your provider. You may need to reschedule your appointment if you arrive late (15 or more minutes).  Arriving late affects you and other patients whose appointments are after yours.  Also, if you miss three or more appointments without notifying the office, you may be dismissed from the clinic at the provider's discretion.      For prescription refill requests, have your pharmacy contact our office and allow 72 hours for refills to be completed.    Today you received the following chemotherapy and/or immunotherapy agents Decitabine      To help prevent nausea and vomiting after your treatment, we encourage you to take your nausea medication as directed.  BELOW ARE SYMPTOMS THAT SHOULD BE REPORTED IMMEDIATELY: *FEVER GREATER THAN 100.4 F (38 C) OR HIGHER *CHILLS OR SWEATING *NAUSEA AND VOMITING THAT IS NOT CONTROLLED WITH YOUR NAUSEA MEDICATION *UNUSUAL SHORTNESS OF BREATH *UNUSUAL BRUISING OR BLEEDING *URINARY PROBLEMS (pain or burning when urinating, or frequent urination) *BOWEL PROBLEMS (unusual diarrhea, constipation, pain near the anus) TENDERNESS IN MOUTH AND THROAT WITH OR WITHOUT PRESENCE OF ULCERS (sore throat, sores in mouth, or a toothache) UNUSUAL RASH, SWELLING OR PAIN  UNUSUAL VAGINAL DISCHARGE OR ITCHING   Items with * indicate a potential emergency and should be followed up as soon as possible or go to the Emergency Department if any problems should occur.  Please show the CHEMOTHERAPY ALERT CARD or IMMUNOTHERAPY ALERT CARD at check-in to the  Emergency Department and triage nurse.  Should you have questions after your visit or need to cancel or reschedule your appointment, please contact [facility] [phone]  and follow the prompts.  Office hours are 8:00 a.m. to 4:30 p.m. Monday - Friday. Please note that voicemails left after 4:00 p.m. may not be returned until the following business day.  We are closed weekends and major holidays. You have access to a nurse at all times for urgent questions. Please call the main number to the clinic [phone] and follow the prompts.  For any non-urgent questions, you may also contact your provider using MyChart. We now offer e-Visits for anyone 18 and older to request care online for non-urgent symptoms. For details visit [email or web address].   Also download the MyChart app! Go to the app store, search "MyChart", open the app, select [facility], and log in with your MyChart username and password.  Masks are optional in the cancer centers. If you would like for your care team to wear a mask while they are taking care of you, please let them know. You may have one support person who is at least 77 years old accompany you for your appointments.  

## 2022-01-19 ENCOUNTER — Inpatient Hospital Stay: Payer: Medicare Other

## 2022-01-19 VITALS — BP 132/90 | HR 60 | Temp 97.9°F | Resp 18

## 2022-01-19 DIAGNOSIS — C9200 Acute myeloblastic leukemia, not having achieved remission: Secondary | ICD-10-CM

## 2022-01-19 DIAGNOSIS — Z95828 Presence of other vascular implants and grafts: Secondary | ICD-10-CM

## 2022-01-19 DIAGNOSIS — D46Z Other myelodysplastic syndromes: Secondary | ICD-10-CM

## 2022-01-19 MED ORDER — SODIUM CHLORIDE 0.9 % IV SOLN
15.0000 mg/m2 | Freq: Once | INTRAVENOUS | Status: AC
  Administered 2022-01-19: 35 mg via INTRAVENOUS
  Filled 2022-01-19: qty 7

## 2022-01-19 MED ORDER — HEPARIN SOD (PORK) LOCK FLUSH 100 UNIT/ML IV SOLN
500.0000 [IU] | Freq: Once | INTRAVENOUS | Status: AC | PRN
  Administered 2022-01-19: 500 [IU]

## 2022-01-19 MED ORDER — SODIUM CHLORIDE 0.9% FLUSH
10.0000 mL | INTRAVENOUS | Status: DC | PRN
  Administered 2022-01-19: 10 mL

## 2022-01-19 MED ORDER — ONDANSETRON HCL 4 MG/2ML IJ SOLN
4.0000 mg | Freq: Once | INTRAMUSCULAR | Status: AC
  Administered 2022-01-19: 4 mg via INTRAVENOUS
  Filled 2022-01-19: qty 2

## 2022-01-19 MED ORDER — SODIUM CHLORIDE 0.9 % IV SOLN: Freq: Once | INTRAVENOUS | Status: AC

## 2022-01-19 NOTE — Progress Notes (Signed)
Patient presents today for D4 Decitabine. Vital signs within parameters for treatment. Patient denies any side effects related to treatment.   Treatment given today per MD orders. Tolerated infusion without adverse affects. Vital signs stable. No complaints at this time. Discharged from clinic on motorized scooter in stable condition. Alert and oriented x 3. F/U with [facility] as scheduled.

## 2022-01-20 ENCOUNTER — Inpatient Hospital Stay: Payer: Medicare Other

## 2022-01-20 VITALS — BP 133/81 | HR 56 | Temp 98.6°F | Resp 16

## 2022-01-20 DIAGNOSIS — C9200 Acute myeloblastic leukemia, not having achieved remission: Secondary | ICD-10-CM | POA: Diagnosis not present

## 2022-01-20 DIAGNOSIS — D46Z Other myelodysplastic syndromes: Secondary | ICD-10-CM

## 2022-01-20 DIAGNOSIS — Z95828 Presence of other vascular implants and grafts: Secondary | ICD-10-CM

## 2022-01-20 MED ORDER — SODIUM CHLORIDE 0.9% FLUSH
10.0000 mL | INTRAVENOUS | Status: DC | PRN
  Administered 2022-01-20: 10 mL

## 2022-01-20 MED ORDER — SODIUM CHLORIDE 0.9 % IV SOLN: Freq: Once | INTRAVENOUS | Status: AC

## 2022-01-20 MED ORDER — SODIUM CHLORIDE 0.9 % IV SOLN
15.0000 mg/m2 | Freq: Once | INTRAVENOUS | Status: AC
  Administered 2022-01-20: 35 mg via INTRAVENOUS
  Filled 2022-01-20: qty 7

## 2022-01-20 MED ORDER — HEPARIN SOD (PORK) LOCK FLUSH 100 UNIT/ML IV SOLN
500.0000 [IU] | Freq: Once | INTRAVENOUS | Status: AC | PRN
  Administered 2022-01-20: 500 [IU]

## 2022-01-20 MED ORDER — ONDANSETRON HCL 4 MG/2ML IJ SOLN
4.0000 mg | Freq: Once | INTRAMUSCULAR | Status: AC
  Administered 2022-01-20: 4 mg via INTRAVENOUS

## 2022-01-20 NOTE — Patient Instructions (Signed)
[facility] [place]  Discharge Instructions: Thank you for choosing [facility] to provide your oncology and hematology care.  If you have a lab appointment with the Cancer Center, please come in thru the Main Entrance and check in at the main information desk.  Wear comfortable clothing and clothing appropriate for easy access to any Portacath or PICC line.   We strive to give you quality time with your provider. You may need to reschedule your appointment if you arrive late (15 or more minutes).  Arriving late affects you and other patients whose appointments are after yours.  Also, if you miss three or more appointments without notifying the office, you may be dismissed from the clinic at the provider's discretion.      For prescription refill requests, have your pharmacy contact our office and allow 72 hours for refills to be completed.    Today you received the following chemotherapy and/or immunotherapy agents Decitabine.  Decitabine Injection What is this medication? DECITABINE (dee SYE ta been) treats blood and bone marrow cancers. It works by slowing down the growth of cancer cells. This medicine may be used for other purposes; ask your health care provider or pharmacist if you have questions. COMMON BRAND NAME(S): Dacogen What should I tell my care team before I take this medication? They need to know if you have any of these conditions: Infection Kidney disease Liver disease An unusual or allergic reaction to decitabine, other medications, foods, dyes, or preservatives Pregnant or trying to get pregnant Breast-feeding How should I use this medication? This medication is infused into a vein. It is given by your care team in a hospital or clinic setting. Talk to your care team about the use of this medication in children. Special care may be needed. Overdosage: If you think you have taken too much of this medicine contact a poison control center or  emergency room at once. NOTE: This medicine is only for you. Do not share this medicine with others. What if I miss a dose? Keep appointments for follow-up doses. It is important not to miss your dose. Call your care team if you are unable to keep an appointment. What may interact with this medication? Interactions are not expected. This list may not describe all possible interactions. Give your health care provider a list of all the medicines, herbs, non-prescription drugs, or dietary supplements you use. Also tell them if you smoke, drink alcohol, or use illegal drugs. Some items may interact with your medicine. What should I watch for while using this medication? Visit your care team for regular checks on your progress. You may need blood work while taking this medication. This medication may make you feel generally unwell. This is not uncommon as chemotherapy can affect healthy cells as well as cancer cells. Report any side effects. Continue your course of treatment even though you feel ill unless your care team tells you to stop. This medication may increase your risk of getting an infection. Call your care team for advice if you get a fever, chills, sore throat, or other symptoms of a cold or flu. Do not treat yourself. Try to avoid being around people who are sick. Be careful brushing or flossing your teeth or using a toothpick because you may get an infection or bleed more easily. If you have any dental work done, tell your dentist you are receiving this medication. Call your care team if you are around anyone with measles, chickenpox, or   if you develop sores or blisters that do not heal properly. Avoid taking medications that contain aspirin, acetaminophen, ibuprofen, naproxen, or ketoprofen unless instructed by your care team. These medications may hide a fever. Talk to your care team if you or your partner wish to become pregnant or think either of you might be pregnant. This medication can  cause serious birth defects if taken during pregnancy or for 6 months after the last dose. A negative pregnancy test is required before starting this medication. A reliable form of contraception is recommended while taking this medication and for 6 months after the last dose. Talk to your care team about reliable forms of contraception. Do not father a child while taking this medication or for 3 months after the last dose. Use a condom while having sex during this time period. Do not breast-feed while taking this medication and for 2 weeks after the last dose. This medication may cause infertility. Talk to your care team if you are concerned about your fertility. What side effects may I notice from receiving this medication? Side effects that you should report to your care team as soon as possible: Allergic reactions--skin rash, itching, hives, swelling of the face, lips, tongue, or throat Infection--fever, chills, cough, sore throat, wounds that don't heal, pain or trouble when passing urine, general feeling of discomfort or being unwell Low red blood cell level--unusual weakness or fatigue, dizziness, headache, trouble breathing Unusual bruising or bleeding Side effects that usually do not require medical attention (report to your care team if they continue or are bothersome): Constipation Diarrhea Fatigue Nausea Pain, redness, or swelling with sores inside the mouth or throat Stomach pain This list may not describe all possible side effects. Call your doctor for medical advice about side effects. You may report side effects to FDA at 1-800-FDA-1088. Where should I keep my medication? This medication is given in a hospital or clinic. It will not be stored at home. NOTE: This sheet is a summary. It may not cover all possible information. If you have questions about this medicine, talk to your doctor, pharmacist, or health care provider.  2023 Elsevier/Gold Standard (2021-08-01 00:00:00)         To help prevent nausea and vomiting after your treatment, we encourage you to take your nausea medication as directed.  BELOW ARE SYMPTOMS THAT SHOULD BE REPORTED IMMEDIATELY: *FEVER GREATER THAN 100.4 F (38 C) OR HIGHER *CHILLS OR SWEATING *NAUSEA AND VOMITING THAT IS NOT CONTROLLED WITH YOUR NAUSEA MEDICATION *UNUSUAL SHORTNESS OF BREATH *UNUSUAL BRUISING OR BLEEDING *URINARY PROBLEMS (pain or burning when urinating, or frequent urination) *BOWEL PROBLEMS (unusual diarrhea, constipation, pain near the anus) TENDERNESS IN MOUTH AND THROAT WITH OR WITHOUT PRESENCE OF ULCERS (sore throat, sores in mouth, or a toothache) UNUSUAL RASH, SWELLING OR PAIN  UNUSUAL VAGINAL DISCHARGE OR ITCHING   Items with * indicate a potential emergency and should be followed up as soon as possible or go to the Emergency Department if any problems should occur.  Please show the CHEMOTHERAPY ALERT CARD or IMMUNOTHERAPY ALERT CARD at check-in to the Emergency Department and triage nurse.  Should you have questions after your visit or need to cancel or reschedule your appointment, please contact [facility] [phone]  and follow the prompts.  Office hours are 8:00 a.m. to 4:30 p.m. Monday - Friday. Please note that voicemails left after 4:00 p.m. may not be returned until the following business day.  We are closed weekends and major   holidays. You have access to a nurse at all times for urgent questions. Please call the main number to the clinic [phone] and follow the prompts.  For any non-urgent questions, you may also contact your provider using MyChart. We now offer e-Visits for anyone 18 and older to request care online for non-urgent symptoms. For details visit [email or web address].   Also download the MyChart app! Go to the app store, search "MyChart", open the app, select [place], and log in with your MyChart username and password.  Masks are optional in the cancer  centers. If you would like for your care team to wear a mask while they are taking care of you, please let them know. You may have one support person who is at least 77 years old accompany you for your appointments.  

## 2022-01-20 NOTE — Progress Notes (Signed)
Patient presents today for Decitabine D5. MAR reviewed and updated. Vital signs within parameters for treatment. Patient has no complaints of any changes since his last treatment.   Treatment given today per MD orders. Tolerated infusion without adverse affects. Vital signs stable. No complaints at this time. Discharged from clinic by his motorized scooter  in stable condition. Alert and oriented x 3. F/U with [facility] as scheduled.

## 2022-01-23 ENCOUNTER — Other Ambulatory Visit (HOSPITAL_COMMUNITY): Payer: Self-pay | Admitting: Hematology

## 2022-01-23 ENCOUNTER — Ambulatory Visit (HOSPITAL_COMMUNITY)
Admission: RE | Admit: 2022-01-23 | Discharge: 2022-01-23 | Disposition: A | Discharge status: Home / Self Care | Payer: Medicare Other | Source: Ambulatory Visit | Attending: Physician Assistant | Admitting: Physician Assistant

## 2022-01-23 DIAGNOSIS — K760 Fatty (change of) liver, not elsewhere classified: Secondary | ICD-10-CM | POA: Insufficient documentation

## 2022-01-24 ENCOUNTER — Encounter: Payer: Self-pay | Admitting: Hematology

## 2022-02-02 ENCOUNTER — Other Ambulatory Visit (HOSPITAL_COMMUNITY): Payer: Self-pay

## 2022-02-10 ENCOUNTER — Other Ambulatory Visit: Payer: Self-pay

## 2022-02-20 ENCOUNTER — Other Ambulatory Visit: Payer: Self-pay

## 2022-02-20 MED ORDER — HYDROCODONE-ACETAMINOPHEN 10-325 MG PO TABS: 1.0000 | ORAL_TABLET | Freq: Four times a day (QID) | ORAL | 0 refills | Status: DC | PRN

## 2022-02-24 ENCOUNTER — Other Ambulatory Visit: Payer: Self-pay

## 2022-02-24 DIAGNOSIS — D46Z Other myelodysplastic syndromes: Secondary | ICD-10-CM

## 2022-02-27 ENCOUNTER — Inpatient Hospital Stay: Payer: Medicare Other

## 2022-02-27 ENCOUNTER — Other Ambulatory Visit: Payer: Self-pay | Admitting: Physician Assistant

## 2022-02-27 VITALS — BP 137/81 | HR 63 | Temp 96.7°F | Resp 18 | Wt 224.2 lb

## 2022-02-27 DIAGNOSIS — Z5111 Encounter for antineoplastic chemotherapy: Secondary | ICD-10-CM | POA: Insufficient documentation

## 2022-02-27 DIAGNOSIS — F0390 Unspecified dementia without behavioral disturbance: Secondary | ICD-10-CM | POA: Insufficient documentation

## 2022-02-27 DIAGNOSIS — M7989 Other specified soft tissue disorders: Secondary | ICD-10-CM | POA: Diagnosis not present

## 2022-02-27 DIAGNOSIS — I129 Hypertensive chronic kidney disease with stage 1 through stage 4 chronic kidney disease, or unspecified chronic kidney disease: Secondary | ICD-10-CM | POA: Diagnosis not present

## 2022-02-27 DIAGNOSIS — C9200 Acute myeloblastic leukemia, not having achieved remission: Secondary | ICD-10-CM

## 2022-02-27 DIAGNOSIS — Z95828 Presence of other vascular implants and grafts: Secondary | ICD-10-CM

## 2022-02-27 DIAGNOSIS — N183 Chronic kidney disease, stage 3 unspecified: Secondary | ICD-10-CM | POA: Insufficient documentation

## 2022-02-27 DIAGNOSIS — Z87891 Personal history of nicotine dependence: Secondary | ICD-10-CM | POA: Diagnosis not present

## 2022-02-27 DIAGNOSIS — M2550 Pain in unspecified joint: Secondary | ICD-10-CM | POA: Diagnosis not present

## 2022-02-27 DIAGNOSIS — Z7969 Long term (current) use of other immunomodulators and immunosuppressants: Secondary | ICD-10-CM | POA: Insufficient documentation

## 2022-02-27 DIAGNOSIS — Z79899 Other long term (current) drug therapy: Secondary | ICD-10-CM | POA: Insufficient documentation

## 2022-02-27 DIAGNOSIS — D46Z Other myelodysplastic syndromes: Secondary | ICD-10-CM

## 2022-02-27 LAB — CBC WITH DIFFERENTIAL/PLATELET
Abs Immature Granulocytes: 0.04 10*3/uL (ref 0.00–0.07)
Basophils Absolute: 0 10*3/uL (ref 0.0–0.1)
Basophils Relative: 1 %
Eosinophils Absolute: 0.1 10*3/uL (ref 0.0–0.5)
Eosinophils Relative: 3 %
HCT: 46 % (ref 39.0–52.0)
Hemoglobin: 15.2 g/dL (ref 13.0–17.0)
Immature Granulocytes: 1 %
Lymphocytes Relative: 23 %
Lymphs Abs: 0.9 10*3/uL (ref 0.7–4.0)
MCH: 31.3 pg (ref 26.0–34.0)
MCHC: 33 g/dL (ref 30.0–36.0)
MCV: 94.7 fL (ref 80.0–100.0)
Monocytes Absolute: 0.4 10*3/uL (ref 0.1–1.0)
Monocytes Relative: 9 %
Neutro Abs: 2.6 10*3/uL (ref 1.7–7.7)
Neutrophils Relative %: 63 %
Platelets: 124 10*3/uL — ABNORMAL LOW (ref 150–400)
RBC: 4.86 MIL/uL (ref 4.22–5.81)
RDW: 14.6 % (ref 11.5–15.5)
WBC: 4 10*3/uL (ref 4.0–10.5)
nRBC: 0 % (ref 0.0–0.2)

## 2022-02-27 LAB — COMPREHENSIVE METABOLIC PANEL
ALT: 29 U/L (ref 0–44)
AST: 25 U/L (ref 15–41)
Albumin: 3.9 g/dL (ref 3.5–5.0)
Alkaline Phosphatase: 60 U/L (ref 38–126)
Anion gap: 8 (ref 5–15)
BUN: 18 mg/dL (ref 8–23)
CO2: 27 mmol/L (ref 22–32)
Calcium: 9.2 mg/dL (ref 8.9–10.3)
Chloride: 107 mmol/L (ref 98–111)
Creatinine, Ser: 0.94 mg/dL (ref 0.61–1.24)
GFR, Estimated: >60 mL/min (ref >60)
Glucose, Bld: 108 mg/dL — ABNORMAL HIGH (ref 70–99)
Potassium: 4.5 mmol/L (ref 3.5–5.1)
Sodium: 142 mmol/L (ref 135–145)
Total Bilirubin: 0.6 mg/dL (ref 0.3–1.2)
Total Protein: 6.2 g/dL — ABNORMAL LOW (ref 6.5–8.1)

## 2022-02-27 LAB — MAGNESIUM: Magnesium: 1.9 mg/dL (ref 1.7–2.4)

## 2022-02-27 LAB — LACTATE DEHYDROGENASE: LDH: 154 U/L (ref 98–192)

## 2022-02-27 MED ORDER — SODIUM CHLORIDE 0.9% FLUSH
10.0000 mL | INTRAVENOUS | Status: DC | PRN
  Administered 2022-02-27: 10 mL

## 2022-02-27 MED ORDER — SODIUM CHLORIDE 0.9 % IV SOLN: Freq: Once | INTRAVENOUS | Status: AC

## 2022-02-27 MED ORDER — HEPARIN SOD (PORK) LOCK FLUSH 100 UNIT/ML IV SOLN
500.0000 [IU] | Freq: Once | INTRAVENOUS | Status: AC | PRN
  Administered 2022-02-27: 500 [IU]

## 2022-02-27 MED ORDER — OXYCODONE HCL 5 MG PO TABS: 5.0000 mg | ORAL_TABLET | Freq: Four times a day (QID) | ORAL | 0 refills | Status: DC | PRN

## 2022-02-27 MED ORDER — ONDANSETRON HCL 4 MG/2ML IJ SOLN
4.0000 mg | Freq: Once | INTRAMUSCULAR | Status: AC
  Administered 2022-02-27: 4 mg via INTRAVENOUS
  Filled 2022-02-27: qty 2

## 2022-02-27 MED ORDER — SODIUM CHLORIDE 0.9 % IV SOLN
15.0000 mg/m2 | Freq: Once | INTRAVENOUS | Status: AC
  Administered 2022-02-27: 35 mg via INTRAVENOUS
  Filled 2022-02-27: qty 7

## 2022-02-27 NOTE — Patient Instructions (Signed)
[facility]  Discharge Instructions: Thank you for choosing [facility] to provide your oncology and hematology care.  If you have a lab appointment with the [facility], please come in thru the Main Entrance and check in at the main information desk.  Wear comfortable clothing and clothing appropriate for easy access to any Portacath or PICC line.   We strive to give you quality time with your provider. You may need to reschedule your appointment if you arrive late (15 or more minutes).  Arriving late affects you and other patients whose appointments are after yours.  Also, if you miss three or more appointments without notifying the office, you may be dismissed from the clinic at the provider's discretion.      For prescription refill requests, have your pharmacy contact our office and allow 72 hours for refills to be completed.    Decitabine; Cedazuridine Tablets What is this medication? DECITABINE; CEDAZURIDINE (dee SYE ta been; sed az ure i deen) treats blood and bone marrow cancers. It works by slowing down the growth of cancer cells. This medicine may be used for other purposes; ask your health care provider or pharmacist if you have questions. COMMON BRAND NAME(S): INQOVI What should I tell my care team before I take this medication? They need to know if you have any of these conditions: Infection Kidney disease Liver disease An unusual or allergic reaction to decitabine, cedazuridine, other medications, foods, dyes, or preservatives Pregnant or trying to get pregnant Breast-feeding How should I use this medication? Take this medication by mouth. Take it as directed on the prescription label at the same time every day. Do not cut, crush, or chew this medication. Swallow the tablets whole. Take it on an empty stomach, at least 2 hours before or 2 hours after food. Do not take it more often than directed. Your care team may change your dose or tell  you stop taking this medication if you get side effects. Do not change your dose or stop taking it unless your care team tells you to. This medication is taken in "cycles." There will be days you do not take it. Talk to your care team if you have questions about when to take your medication. It is very important to follow the exact schedule. Taking it more often than directed can cause serious side effects. Talk to your care team about the use of this medication in children. Special care may be needed. Overdosage: If you think you have taken too much of this medicine contact a poison control center or emergency room at once. NOTE: This medicine is only for you. Do not share this medicine with others. What if I miss a dose? If you miss a dose, take it as soon as you can unless it is more than 12 hours late. If it is more than 12 hours late, skip the missed dose. Take the next dose at the normal time. Do not take double or extra doses. What may interact with this medication? This medication may affect how other medications work, and other medications may affect the way this medication works. Talk with your care team about all of the medications you take. They may suggest changes to your treatment plan to lower the risk of side effects and to make sure your medications work as intended. This list may not describe all possible interactions. Give your health care provider a list of all the medicines, herbs, non-prescription drugs, or dietary  supplements you use. Also tell them if you smoke, drink alcohol, or use illegal drugs. Some items may interact with your medicine. What should I watch for while using this medication? Visit your care team for regular checks on your progress. You may need blood work while taking this medication. This medication may make you feel generally unwell. This is not uncommon as chemotherapy can affect healthy cells as well as cancer cells. Report any side effects. Continue your  course of treatment even though you feel ill unless your care team tells you to stop. This medication may increase your risk of getting an infection. Call your care team for advice if you get a fever, chills, sore throat, or other symptoms of a cold or flu. Do not treat yourself. Try to avoid being around people who are sick. Be careful brushing or flossing your teeth or using a toothpick because you may get an infection or bleed more easily. If you have any dental work done, tell your dentist you are receiving this medication. Call your care team if you are around anyone with measles, chickenpox, or if you develop sores or blisters that do not heal properly. Avoid taking medications that contain aspirin, acetaminophen, ibuprofen, naproxen, or ketoprofen unless instructed by your care team. These medications may hide a fever. Talk to your care team if you or your partner wish to become pregnant or think either of you might be pregnant. This medication can cause serious birth defects if taken during pregnancy or for 6 months after stopping therapy. A negative pregnancy test is required before starting this medication. A reliable form of contraception is recommended while taking this medication and for 6 months after stopping therapy. Talk to your care team about reliable forms of contraception. Use a condom during sex and for 3 months after stopping therapy. Tell your care team right away if you think your partner might be pregnant. This medication can cause serious birth defects. Do not breast-feed while taking this medication and for 2 weeks after stopping therapy. This medication may cause infertility. Talk to your care team if you are concerned about your fertility. What side effects may I notice from receiving this medication? Side effects that you should report to your care team as soon as possible: Allergic reactions--skin rash, itching, hives, swelling of the face, lips, tongue, or  throat Infection--fever, chills, cough, sore throat, wounds that don't heal, pain or trouble when passing urine, general feeling of discomfort or being unwell Low red blood cell level--unusual weakness or fatigue, dizziness, headache, trouble breathing Unusual bruising or bleeding Side effects that usually do not require medical attention (report to your care team if they continue or are bothersome): Constipation Diarrhea Fatigue Nausea Pain, redness, or swelling with sores inside the mouth or throat Stomach pain This list may not describe all possible side effects. Call your doctor for medical advice about side effects. You may report side effects to FDA at 1-800-FDA-1088. Where should I keep my medication? Keep out of the reach of children and pets. Store between 20 and 25 degrees C (68 and 77 degrees F). Get rid of any unused medication after the expiration date. To get rid of medications that are no longer needed or have expired: Take the medication to a medication take-back program. Check with your pharmacy or law enforcement to find a location. If you cannot return the medication, ask your pharmacist or care team how to get rid of this medication safely. NOTE: This sheet is a  summary. It may not cover all possible information. If you have questions about this medicine, talk to your doctor, pharmacist, or health care provider.  2023 Elsevier/Gold Standard (2021-08-01 00:00:00)     To help prevent nausea and vomiting after your treatment, we encourage you to take your nausea medication as directed.  BELOW ARE SYMPTOMS THAT SHOULD BE REPORTED IMMEDIATELY: *FEVER GREATER THAN 100.4 F (38 C) OR HIGHER *CHILLS OR SWEATING *NAUSEA AND VOMITING THAT IS NOT CONTROLLED WITH YOUR NAUSEA MEDICATION *UNUSUAL SHORTNESS OF BREATH *UNUSUAL BRUISING OR BLEEDING *URINARY PROBLEMS (pain or burning when urinating, or frequent urination) *BOWEL PROBLEMS (unusual diarrhea, constipation, pain near  the anus) TENDERNESS IN MOUTH AND THROAT WITH OR WITHOUT PRESENCE OF ULCERS (sore throat, sores in mouth, or a toothache) UNUSUAL RASH, SWELLING OR PAIN  UNUSUAL VAGINAL DISCHARGE OR ITCHING   Items with * indicate a potential emergency and should be followed up as soon as possible or go to the Emergency Department if any problems should occur.  Please show the CHEMOTHERAPY ALERT CARD or IMMUNOTHERAPY ALERT CARD at check-in to the Emergency Department and triage nurse.  Should you have questions after your visit or need to cancel or reschedule your appointment, please contact [facility] [phone]  and follow the prompts.  Office hours are 8:00 a.m. to 4:30 p.m. Monday - Friday. Please note that voicemails left after 4:00 p.m. may not be returned until the following business day.  We are closed weekends and major holidays. You have access to a nurse at all times for urgent questions. Please call the main number to the clinic [phone] and follow the prompts.  For any non-urgent questions, you may also contact your provider using MyChart. We now offer e-Visits for anyone 34 and older to request care online for non-urgent symptoms. For details visit [email or web address].   Also download the MyChart app! Go to the app store, search "MyChart", open the app, select [facility], and log in with your MyChart username and password.  Masks are optional in the cancer centers. If you would like for your care team to wear a mask while they are taking care of you, please let them know. You may have one support person who is at least 77 years old accompany you for your appointments.

## 2022-02-27 NOTE — Progress Notes (Signed)
Patient tolerated therapy with no complaints voiced.  Side effects with management reviewed with understanding verbalized.  Port site clean and dry with no bruising or swelling noted at site.  Good blood return noted before and after administration of therapy.  Band aid applied.  Patient left in satisfactory condition with VSS and no s/s of distress noted.  

## 2022-02-28 ENCOUNTER — Inpatient Hospital Stay: Payer: Medicare Other | Attending: Hematology | Admitting: Hematology

## 2022-02-28 ENCOUNTER — Inpatient Hospital Stay: Payer: Medicare Other

## 2022-02-28 VITALS — BP 139/92 | HR 62 | Temp 97.1°F | Resp 20 | Ht 69.0 in | Wt 224.0 lb

## 2022-02-28 DIAGNOSIS — Z95828 Presence of other vascular implants and grafts: Secondary | ICD-10-CM

## 2022-02-28 DIAGNOSIS — C9200 Acute myeloblastic leukemia, not having achieved remission: Secondary | ICD-10-CM | POA: Diagnosis not present

## 2022-02-28 DIAGNOSIS — D46Z Other myelodysplastic syndromes: Secondary | ICD-10-CM

## 2022-02-28 DIAGNOSIS — Z5111 Encounter for antineoplastic chemotherapy: Secondary | ICD-10-CM | POA: Diagnosis not present

## 2022-02-28 MED ORDER — SODIUM CHLORIDE 0.9 % IV SOLN: Freq: Once | INTRAVENOUS | Status: AC

## 2022-02-28 MED ORDER — SODIUM CHLORIDE 0.9% FLUSH
10.0000 mL | INTRAVENOUS | Status: DC | PRN
  Administered 2022-02-28: 10 mL

## 2022-02-28 MED ORDER — HEPARIN SOD (PORK) LOCK FLUSH 100 UNIT/ML IV SOLN
500.0000 [IU] | Freq: Once | INTRAVENOUS | Status: AC | PRN
  Administered 2022-02-28: 500 [IU]

## 2022-02-28 MED ORDER — ONDANSETRON HCL 4 MG/2ML IJ SOLN
4.0000 mg | Freq: Once | INTRAMUSCULAR | Status: AC
  Administered 2022-02-28: 4 mg via INTRAVENOUS
  Filled 2022-02-28: qty 2

## 2022-02-28 MED ORDER — SODIUM CHLORIDE 0.9 % IV SOLN
15.0000 mg/m2 | Freq: Once | INTRAVENOUS | Status: AC
  Administered 2022-02-28: 35 mg via INTRAVENOUS
  Filled 2022-02-28: qty 7

## 2022-02-28 NOTE — Progress Notes (Signed)
Patient presents today for treatment D2 Decitabine and follow up with Dr. Delton Coombes. Vital signs are within parameters for treatment. Patient taking Venclexta as prescribed and denies any side effects related to that. Patient has no complaints today.   Treatment given today per MD orders. Tolerated infusion without adverse affects. Vital signs stable. No complaints at this time. Discharged from clinic by motorized chair in stable condition. Alert and oriented x 3. F/U with [facility] as scheduled.

## 2022-02-28 NOTE — Progress Notes (Signed)
Patient is taking Venetoclax as prescribed. He has not missed any doses and reports no side effects at this time.  Patient has been assessed, vital signs and labs have been reviewed by Dr. Delton Coombes. ANC, Creatinine, LFTs, and Platelets are within treatment parameters per Dr. Delton Coombes. The patient is good to proceed with treatment at this time.  Primary RN and pharmacy aware.

## 2022-02-28 NOTE — Patient Instructions (Signed)
[facility]  Discharge Instructions  You were seen and examined today by Dr. Delton Coombes.  Your labs are stable. Proceed with treatment as planned.  Follow-up as scheduled.  Thank you for choosing [facility] to provide your oncology and hematology care.   To afford each patient quality time with our provider, please arrive at least 15 minutes before your scheduled appointment time. You may need to reschedule your appointment if you arrive late (10 or more minutes). Arriving late affects you and other patients whose appointments are after yours.  Also, if you miss three or more appointments without notifying the office, you may be dismissed from the clinic at the provider's discretion.    Again, thank you for choosing [facility].  Our hope is that these requests will decrease the amount of time that you wait before being seen by our physicians.   If you have a lab appointment with the [facility] please come in thru the Main Entrance and check in at the main information desk.           _____________________________________________________________  Should you have questions after your visit to [facility], please contact our office at [phone] and follow the prompts.  Our office hours are 8:00 a.m. to 4:30 p.m. Monday - Thursday and 8:00 a.m. to 2:30 p.m. Friday.  Please note that voicemails left after 4:00 p.m. may not be returned until the following business day.  We are closed weekends and all major holidays.  You do have access to a nurse 24-7, just call the main number to the clinic [phone] and do not press any options, hold on the line and a nurse will answer the phone.    For prescription refill requests, have your pharmacy contact our office and allow 72 hours.    Masks are optional in the cancer centers. If you would like for your care team to wear a mask while they are taking care of you,  please let them know. You may have one support person who is at least 77 years old accompany you for your appointments.

## 2022-02-28 NOTE — Progress Notes (Signed)
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[place])   08/22/2018 - 12/06/2018 Chemotherapy   The patient had palonosetron (ALOXI) injection 0.25 mg, 0.25 mg, Intravenous,  Once, 4 of 6 cycles Administration: 0.25 mg (08/22/2018), 0.25 mg (08/26/2018), 0.25 mg (08/28/2018), 0.25 mg (08/30/2018), 0.25 mg (10/01/2018), 0.25 mg (10/02/2018), 0.25 mg (11/04/2018), 0.25 mg (09/23/2018), 0.25 mg (09/25/2018), 0.25 mg (09/27/2018), 0.25 mg (10/28/2018), 0.25 mg (10/30/2018), 0.25 mg (11/01/2018), 0.25 mg (12/02/2018), 0.25 mg (12/04/2018), 0.25 mg (12/06/2018) azaCITIDine (VIDAZA) 100 mg in sodium chloride 0.9 % 50 mL chemo infusion, 110 mg (66.7 % of original dose 75 mg/m2), Intravenous, Once, 4 of 6 cycles Dose modification: 50 mg/m2 (original dose 75 mg/m2, Cycle 1, Reason: Provider Judgment), 50 mg/m2 (original dose 75 mg/m2, Cycle 2, Reason: Provider Judgment) Administration: 100 mg (08/22/2018), 100 mg (08/23/2018), 100 mg (08/26/2018), 100 mg (08/27/2018), 100 mg (08/28/2018), 100 mg (08/29/2018), 100 mg (08/30/2018), 100 mg (10/01/2018), 100 mg (10/02/2018), 100 mg (11/04/2018), 100 mg (11/05/2018), 100 mg (09/23/2018), 100 mg (09/24/2018), 100 mg (09/25/2018), 100 mg (09/26/2018), 100 mg (09/27/2018), 100 mg (10/28/2018), 100 mg (10/29/2018), 100 mg (10/30/2018), 100 mg (10/31/2018), 100 mg (11/01/2018), 100 mg (12/02/2018), 100 mg (12/03/2018), 100 mg (12/04/2018), 100 mg (12/05/2018), 100 mg (12/06/2018)  for chemotherapy treatment.    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  INTERVAL HISTORY:  [name], a 76 y.o. male, returns for follow-up of acute myeloid leukemia and consideration of next cycle of chemotherapy.  Denies any infections or fevers in the last 6 weeks.  Reports energy levels of 70%.  Arthritic pains in the knees have been stable.  REVIEW OF SYSTEMS:  Review of Systems  Cardiovascular:  Positive for leg swelling.  Gastrointestinal:  Negative for vomiting.  Musculoskeletal:  Positive for arthralgias (knee).  All other systems reviewed and are negative.   PAST MEDICAL/SURGICAL HISTORY:  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   Cancer ([place]) 01-28-2014   skin cancer   CKD (chronic kidney disease), stage III (HCC)    GERD (gastroesophageal reflux disease)    Heart murmur    NOTED DURING PHYSICAL WHEN HE WAS ENLISTING IN [facility] , DIDNT KNOW UNTIL THAT TIME AND REPORTS , "THATS THE LAST I HEARD ABOUT IT "  History of hypertension    no longer issue   History of kidney stones    History of malignant melanoma of skin    excision top of scalp 2015-- no recurrence   History of urinary retention    post op lumbar fusion surgery 04/ 2016   Hypertension    Kidney dysfunction    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, MONITORED BY [facility] DR Annie Main DAHLSTEDT    Left ureteral calculus     Seasonal allergies    Wears glasses    Wears glasses    Wears partial dentures    upper and lower   Past Surgical History:  Procedure Laterality Date   ANKLE FUSION Right 2007   CARPAL TUNNEL RELEASE Left 12/28/2009   w/ pulley release left long finger   CARPAL TUNNEL RELEASE Right 07/22/2013   Procedure: RIGHT CARPAL TUNNEL RELEASE;  Surgeon: Cammie Sickle., MD;  Location: [facility];  Service: Orthopedics;  Laterality: Right;   COLONOSCOPY     CYSTO/  LEFT RETROGRADE PYELOGRAM  11/21/2010   CYSTOSCOPY WITH STENT PLACEMENT Left 03/09/2016   Procedure: CYSTOSCOPY WITH STENT PLACEMENT;  Surgeon: Franchot Gallo, MD;  Location: [facility];  Service: Urology;  Laterality: Left;   CYSTOSCOPY/RETROGRADE/URETEROSCOPY/STONE EXTRACTION WITH BASKET Left 03/09/2016   Procedure: CYSTOSCOPY/RETROGRADE/URETEROSCOPY/STONE EXTRACTION WITH BASKET;  Surgeon: Franchot Gallo, MD;  Location: [facility];  Service: Urology;  Laterality: Left;   LEFT URETEROSCOPIC LASER LITHOTRIPSY STONE EXTRACTION/ STENT PLACEMENT  05/23/2010   MOHS SURGERY     TOP OF THE HEAD    ORIF ANKLE FRACTURE Right 1978   PORT-A-CATH REMOVAL Right 02/14/2019   Procedure: MINOR REMOVAL PORT-A-CATH;  Surgeon: Aviva Signs, MD;  Location: [facility];  Service: General;  Laterality: Right;   PORTACATH PLACEMENT Right 08/19/2018   Procedure: INSERTION PORT-A-CATH (attached catheter in right subclavian);  Surgeon: Aviva Signs, MD;  Location: [facility];  Service: General;  Laterality: Right;   PORTACATH PLACEMENT Left 05/16/2019   Procedure: INSERTION PORT-A-CATH (attached catheter in left subclavian);  Surgeon: Aviva Signs, MD;  Location: [facility];  Service: General;  Laterality: Left;   POSTERIOR LUMBAR FUSION  08/21/2014   laminectomy and decompression L2 -- L5   RIGHT LOWER LEG SURGERY  X3  1975 to 1976   including ORIF   TONSILLECTOMY AND ADENOIDECTOMY  2595   UMBILICAL HERNIA REPAIR   2009 approx    SOCIAL HISTORY:  Social History   Socioeconomic History   Marital status: Married    Spouse name: Not on file   Number of children: Not on file   Years of education: Not on file   Highest education level: Not on file  Occupational History   Not on file  Tobacco Use   Smoking status: Former    Years: 20.00    Types: Cigarettes    Quit date: 07/17/1986    Years since quitting: 35.6   Smokeless tobacco: Never  Vaping Use   Vaping Use: Never used  Substance and Sexual Activity   Alcohol use: Yes    Alcohol/week: 7.0 - 14.0 standard drinks of alcohol    Types: 7 - 14 Cans of beer per week    Comment: 1 -2 beer daily   Drug use: No   Sexual activity: Not Currently  Other Topics Concern   Not on file  Social History Narrative   Not on file   Social Determinants of Health   Financial Resource Strain: Low Risk  (  03/23/2020)   Overall Financial Resource Strain (CARDIA)    Difficulty of Paying Living Expenses: Not hard at all  Food Insecurity: No Food Insecurity (03/23/2020)   Hunger Vital Sign    Worried About Running Out of Food in the Last Year: Never true    Ran Out of Food in the Last Year: Never true  Transportation Needs: No Transportation Needs (03/23/2020)   PRAPARE - Hydrologist (Medical): No    Lack of Transportation (Non-Medical): No  Physical Activity: Unknown (02/17/2019)   Exercise Vital Sign    Days of Exercise per Week: Patient refused    Minutes of Exercise per Session: Patient refused  Stress: No Stress Concern Present (03/23/2020)   [place]    Feeling of Stress : Not at all  Social Connections: Moderately Isolated (03/23/2020)   Social Connection and Isolation Panel [NHANES]    Frequency of Communication with Friends and Family: Twice a week    Frequency of Social Gatherings with Friends and Family: Never    Attends Religious Services:  Never    Marine scientist or Organizations: Yes    Attends Music therapist: More than 4 times per year    Marital Status: Married  Human resources officer Violence: Unknown (02/17/2019)   Humiliation, Afraid, Rape, and Kick questionnaire    Fear of Current or Ex-Partner: Patient refused    Emotionally Abused: Patient refused    Physically Abused: Patient refused    Sexually Abused: Patient refused    FAMILY HISTORY:  Family History  Problem Relation Age of Onset   Stroke Mother    Prostate cancer Father    Bone cancer Father    Diverticulitis Father    Rheum arthritis Sister    Urinary tract infection Sister    Colon cancer Neg Hx     CURRENT MEDICATIONS:  Current Outpatient Medications  Medication Sig Dispense Refill   ACETAMINOPHEN EXTRA STRENGTH 500 MG capsule      allopurinol (ZYLOPRIM) 300 MG tablet Take 300 mg by mouth daily.     ALPRAZolam (XANAX) 0.25 MG tablet Take 1 tablet by mouth twice daily as needed for anxiety 60 tablet 0   amLODipine (NORVASC) 2.5 MG tablet Take 1 tablet by mouth once daily 90 tablet 0   cetirizine (ZYRTEC) 10 MG tablet Take 10 mg by mouth daily. IN THE MORNING     diclofenac Sodium (VOLTAREN) 1 % GEL Apply three times daily to knees as needed for pain 50 g 3   docusate sodium (COLACE) 100 MG capsule Take 100 mg by mouth at bedtime.      donepezil (ARICEPT) 10 MG tablet TAKE 1 TABLET BY MOUTH AT BEDTIME 90 tablet 3   furosemide (LASIX) 20 MG tablet Take 1 tablet (20 mg total) by mouth daily as needed. 30 tablet 2   ipratropium (ATROVENT) 0.03 % nasal spray Place 2 sprays into both nostrils 3 (three) times daily. 30 mL 6   lansoprazole (PREVACID) 15 MG capsule Take 15 mg by mouth daily.      lidocaine-prilocaine (EMLA) cream Apply 1 application topically See admin instructions. ONE HOUR PRIOR TO CHEMOTHERAPY APPOINTMENT 30 g 6   magnesium oxide (MAG-OX) 400 (240 Mg) MG tablet TAKE 1 TABLET BY MOUTH THREE TIMES DAILY 90 tablet 3    oxyCODONE (OXY IR/ROXICODONE) 5 MG immediate release tablet Take 1-2 tablets (5-10 mg total) by mouth every 6 (six) hours as  needed. 120 tablet 0   SF 5000 PLUS 1.1 % CREA dental cream SMARTSIG:Sparingly By Mouth Every Night     tamsulosin (FLOMAX) 0.4 MG CAPS capsule TAKE 1 CAPSULE BY MOUTH IN THE EVENING 30 capsule 6   venetoclax (VENCLEXTA) 100 MG tablet TAKE 2 TABLETS (200 MG) BY MOUTH DAILY 60 tablet 2   No current facility-administered medications for this visit.   Facility-Administered Medications Ordered in Other Visits  Medication Dose Route Frequency Provider Last Rate Last Admin   octreotide (SANDOSTATIN LAR) 30 MG IM injection            sodium chloride flush (NS) 0.9 % injection 20 mL  20 mL Intravenous PRN [name], MD   20 mL at 07/21/19 1053    ALLERGIES:  No Known Allergies  PHYSICAL EXAM:  Performance status (ECOG): 1 - Symptomatic but completely ambulatory  There were no vitals filed for this visit.  Wt Readings from Last 3 Encounters:  02/28/22 224 lb (101.6 kg)  02/27/22 224 lb 3.2 oz (101.7 kg)  01/16/22 220 lb 7.4 oz (100 kg)   Physical Exam Vitals reviewed.  Constitutional:      Appearance: Normal appearance. He is obese.     Comments: In wheelchair  Cardiovascular:     Rate and Rhythm: Normal rate and regular rhythm.     Pulses: Normal pulses.     Heart sounds: Normal heart sounds.  Pulmonary:     Effort: Pulmonary effort is normal.     Breath sounds: Normal breath sounds.  Musculoskeletal:     Right lower leg: 3+ Edema present.     Left lower leg: Edema (trace) present.  Neurological:     General: No focal deficit present.     Mental Status: He is alert and oriented to person, place, and time.  Psychiatric:        Mood and Affect: Mood normal.        Behavior: Behavior normal.     LABORATORY DATA:  I have reviewed the labs as listed.     Latest Ref Rng & Units 02/27/2022   10:00 AM 01/16/2022    9:53 AM 11/28/2021    9:42 AM   CBC  WBC 4.0 - 10.5 K/uL 4.0  5.5  4.7   Hemoglobin 13.0 - 17.0 g/dL 15.2  15.5  15.0   Hematocrit 39.0 - 52.0 % 46.0  46.5  45.4   Platelets 150 - 400 K/uL 124  124  145       Latest Ref Rng & Units 02/27/2022   10:00 AM 01/16/2022    9:53 AM 11/28/2021    9:42 AM  CMP  Glucose 70 - 99 mg/dL 108  109  101   BUN 8 - 23 mg/dL '18  23  20   '$ Creatinine 0.61 - 1.24 mg/dL 0.94  0.92  0.88   Sodium 135 - 145 mmol/L 142  142  140   Potassium 3.5 - 5.1 mmol/L 4.5  4.2  4.2   Chloride 98 - 111 mmol/L 107  109  109   CO2 22 - 32 mmol/L '27  25  25   '$ Calcium 8.9 - 10.3 mg/dL 9.2  9.4  9.2   Total Protein 6.5 - 8.1 g/dL 6.2  6.4  6.2   Total Bilirubin 0.3 - 1.2 mg/dL 0.6  0.7  0.7   Alkaline Phos 38 - 126 U/L 60  60  64   AST 15 - 41 U/L 25  22  25   ALT 0 - 44 U/L '29  29  31     '$ DIAGNOSTIC IMAGING:  I have independently reviewed the scans and discussed with the patient. No results found.   ASSESSMENT:  1.  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  -BMBX on 02/25/2019 after cycle 1 did not show any evidence of leukemia. -CT CAP on 02/12/2019 shows numerous bilateral irregular/spiculated pulmonary nodules measuring up to 14 mm, nonspecific.  Hepatic steatosis.  Spleen is normal. -I have talked to Dr.Ravandi at [facility] per patient request.  There are clinical trials available upon progression of his AML.  There are also some clinical trials available now based on MRD positivity.  Patient not interested in moving to [place] at this time.   PLAN:  1.  AML: - I have reviewed labs from 02/27/2022 which showed normal LFTs and creatinine.  CBC was grossly normal with platelet count 124.  LH was normal. - Ultrasound abdomen from 01/23/2022 showed fatty liver and atrophic left kidney with cortical thinning. - Proceed with next cycle of decitabine daily for 5 days. - Take venetoclax 200 mg daily for 2  weeks and then stop. - RTC 6 weeks for follow-up with repeat labs and treatment.   2.  Arthralgias: - Continue hydrocodone 10/325 every 6 hours as needed.   3.  Hypomagnesemia: - Continue magnesium 3 times daily.   4.  Hypertension: - Continue Norvasc 2.5 mg daily.   5.  Dementia: - Continue Aricept 10 mg daily.  6.  Right leg swelling: - Continue Lasix 20 mg as needed in the mornings.   Orders placed this encounter:  Orders Placed This Encounter  Procedures   CBC with Differential   Comprehensive metabolic panel      [name], MD [place] [phone]

## 2022-02-28 NOTE — Patient Instructions (Signed)
[facility]  Discharge Instructions: Thank you for choosing [facility] to provide your oncology and hematology care.  If you have a lab appointment with the Cancer Center, please come in thru the Main Entrance and check in at the main information desk.  Wear comfortable clothing and clothing appropriate for easy access to any Portacath or PICC line.   We strive to give you quality time with your provider. You may need to reschedule your appointment if you arrive late (15 or more minutes).  Arriving late affects you and other patients whose appointments are after yours.  Also, if you miss three or more appointments without notifying the office, you may be dismissed from the clinic at the provider's discretion.      For prescription refill requests, have your pharmacy contact our office and allow 72 hours for refills to be completed.    Today you received the following chemotherapy and/or immunotherapy agents Decitabine.  Decitabine Injection What is this medication? DECITABINE (dee SYE ta been) treats blood and bone marrow cancers. It works by slowing down the growth of cancer cells. This medicine may be used for other purposes; ask your health care provider or pharmacist if you have questions. COMMON BRAND NAME(S): Dacogen What should I tell my care team before I take this medication? They need to know if you have any of these conditions: Infection Kidney disease Liver disease An unusual or allergic reaction to decitabine, other medications, foods, dyes, or preservatives Pregnant or trying to get pregnant Breast-feeding How should I use this medication? This medication is infused into a vein. It is given by your care team in a hospital or clinic setting. Talk to your care team about the use of this medication in children. Special care may be needed. Overdosage: If you think you have taken too much of this medicine contact a poison control center or  emergency room at once. NOTE: This medicine is only for you. Do not share this medicine with others. What if I miss a dose? Keep appointments for follow-up doses. It is important not to miss your dose. Call your care team if you are unable to keep an appointment. What may interact with this medication? Interactions are not expected. This list may not describe all possible interactions. Give your health care provider a list of all the medicines, herbs, non-prescription drugs, or dietary supplements you use. Also tell them if you smoke, drink alcohol, or use illegal drugs. Some items may interact with your medicine. What should I watch for while using this medication? Visit your care team for regular checks on your progress. You may need blood work while taking this medication. This medication may make you feel generally unwell. This is not uncommon as chemotherapy can affect healthy cells as well as cancer cells. Report any side effects. Continue your course of treatment even though you feel ill unless your care team tells you to stop. This medication may increase your risk of getting an infection. Call your care team for advice if you get a fever, chills, sore throat, or other symptoms of a cold or flu. Do not treat yourself. Try to avoid being around people who are sick. Be careful brushing or flossing your teeth or using a toothpick because you may get an infection or bleed more easily. If you have any dental work done, tell your dentist you are receiving this medication. Call your care team if you are around anyone with measles, chickenpox, or   if you develop sores or blisters that do not heal properly. Avoid taking medications that contain aspirin, acetaminophen, ibuprofen, naproxen, or ketoprofen unless instructed by your care team. These medications may hide a fever. Talk to your care team if you or your partner wish to become pregnant or think either of you might be pregnant. This medication can  cause serious birth defects if taken during pregnancy or for 6 months after the last dose. A negative pregnancy test is required before starting this medication. A reliable form of contraception is recommended while taking this medication and for 6 months after the last dose. Talk to your care team about reliable forms of contraception. Do not father a child while taking this medication or for 3 months after the last dose. Use a condom while having sex during this time period. Do not breast-feed while taking this medication and for 2 weeks after the last dose. This medication may cause infertility. Talk to your care team if you are concerned about your fertility. What side effects may I notice from receiving this medication? Side effects that you should report to your care team as soon as possible: Allergic reactions--skin rash, itching, hives, swelling of the face, lips, tongue, or throat Infection--fever, chills, cough, sore throat, wounds that don't heal, pain or trouble when passing urine, general feeling of discomfort or being unwell Low red blood cell level--unusual weakness or fatigue, dizziness, headache, trouble breathing Unusual bruising or bleeding Side effects that usually do not require medical attention (report to your care team if they continue or are bothersome): Constipation Diarrhea Fatigue Nausea Pain, redness, or swelling with sores inside the mouth or throat Stomach pain This list may not describe all possible side effects. Call your doctor for medical advice about side effects. You may report side effects to FDA at 1-800-FDA-1088. Where should I keep my medication? This medication is given in a hospital or clinic. It will not be stored at home. NOTE: This sheet is a summary. It may not cover all possible information. If you have questions about this medicine, talk to your doctor, pharmacist, or health care provider.  2023 Elsevier/Gold Standard (2021-08-01 00:00:00)         To help prevent nausea and vomiting after your treatment, we encourage you to take your nausea medication as directed.  BELOW ARE SYMPTOMS THAT SHOULD BE REPORTED IMMEDIATELY: *FEVER GREATER THAN 100.4 F (38 C) OR HIGHER *CHILLS OR SWEATING *NAUSEA AND VOMITING THAT IS NOT CONTROLLED WITH YOUR NAUSEA MEDICATION *UNUSUAL SHORTNESS OF BREATH *UNUSUAL BRUISING OR BLEEDING *URINARY PROBLEMS (pain or burning when urinating, or frequent urination) *BOWEL PROBLEMS (unusual diarrhea, constipation, pain near the anus) TENDERNESS IN MOUTH AND THROAT WITH OR WITHOUT PRESENCE OF ULCERS (sore throat, sores in mouth, or a toothache) UNUSUAL RASH, SWELLING OR PAIN  UNUSUAL VAGINAL DISCHARGE OR ITCHING   Items with * indicate a potential emergency and should be followed up as soon as possible or go to the Emergency Department if any problems should occur.  Please show the CHEMOTHERAPY ALERT CARD or IMMUNOTHERAPY ALERT CARD at check-in to the Emergency Department and triage nurse.  Should you have questions after your visit or need to cancel or reschedule your appointment, please contact [facility] [phone]  and follow the prompts.  Office hours are 8:00 a.m. to 4:30 p.m. Monday - Friday. Please note that voicemails left after 4:00 p.m. may not be returned until the following business day.  We are closed weekends and major   holidays. You have access to a nurse at all times for urgent questions. Please call the main number to the clinic [phone] and follow the prompts.  For any non-urgent questions, you may also contact your provider using MyChart. We now offer e-Visits for anyone 18 and older to request care online for non-urgent symptoms. For details visit [email or web address].   Also download the MyChart app! Go to the app store, search "MyChart", open the app, select [facility], and log in with your MyChart username and password.  Masks are optional in the cancer  centers. If you would like for your care team to wear a mask while they are taking care of you, please let them know. You may have one support person who is at least 77 years old accompany you for your appointments.  

## 2022-03-01 ENCOUNTER — Inpatient Hospital Stay: Payer: Medicare Other

## 2022-03-01 VITALS — BP 153/89 | HR 71 | Temp 96.9°F | Resp 18

## 2022-03-01 DIAGNOSIS — C9200 Acute myeloblastic leukemia, not having achieved remission: Secondary | ICD-10-CM

## 2022-03-01 DIAGNOSIS — D46Z Other myelodysplastic syndromes: Secondary | ICD-10-CM

## 2022-03-01 DIAGNOSIS — Z95828 Presence of other vascular implants and grafts: Secondary | ICD-10-CM

## 2022-03-01 DIAGNOSIS — Z5111 Encounter for antineoplastic chemotherapy: Secondary | ICD-10-CM | POA: Diagnosis not present

## 2022-03-01 MED ORDER — SODIUM CHLORIDE 0.9 % IV SOLN
15.0000 mg/m2 | Freq: Once | INTRAVENOUS | Status: AC
  Administered 2022-03-01: 35 mg via INTRAVENOUS
  Filled 2022-03-01: qty 7

## 2022-03-01 MED ORDER — ONDANSETRON HCL 4 MG/2ML IJ SOLN
4.0000 mg | Freq: Once | INTRAMUSCULAR | Status: AC
  Administered 2022-03-01: 4 mg via INTRAVENOUS
  Filled 2022-03-01: qty 2

## 2022-03-01 MED ORDER — SODIUM CHLORIDE 0.9% FLUSH
10.0000 mL | INTRAVENOUS | Status: DC | PRN
  Administered 2022-03-01: 10 mL

## 2022-03-01 MED ORDER — HEPARIN SOD (PORK) LOCK FLUSH 100 UNIT/ML IV SOLN
500.0000 [IU] | Freq: Once | INTRAVENOUS | Status: AC | PRN
  Administered 2022-03-01: 500 [IU]

## 2022-03-01 MED ORDER — SODIUM CHLORIDE 0.9 % IV SOLN: Freq: Once | INTRAVENOUS | Status: AC

## 2022-03-01 NOTE — Progress Notes (Signed)
Patient presents today for treatment . Decitabine D3. Vital signs stable and within parameters for treatment. Patient denies any side effects related to yesterday's treatment.   Treatment given today per MD orders. Tolerated infusion without adverse affects. Vital signs stable. No complaints at this time. Discharged from clinic by wheel chair in stable condition. Alert and oriented x 3. F/U with [facility] as scheduled.

## 2022-03-02 ENCOUNTER — Inpatient Hospital Stay: Payer: Medicare Other

## 2022-03-02 VITALS — BP 134/78 | HR 65 | Temp 98.6°F | Resp 16

## 2022-03-02 DIAGNOSIS — Z95828 Presence of other vascular implants and grafts: Secondary | ICD-10-CM

## 2022-03-02 DIAGNOSIS — D46Z Other myelodysplastic syndromes: Secondary | ICD-10-CM

## 2022-03-02 DIAGNOSIS — Z5111 Encounter for antineoplastic chemotherapy: Secondary | ICD-10-CM | POA: Diagnosis not present

## 2022-03-02 DIAGNOSIS — C9200 Acute myeloblastic leukemia, not having achieved remission: Secondary | ICD-10-CM

## 2022-03-02 MED ORDER — ONDANSETRON HCL 4 MG/2ML IJ SOLN
4.0000 mg | Freq: Once | INTRAMUSCULAR | Status: AC
  Administered 2022-03-02: 4 mg via INTRAVENOUS
  Filled 2022-03-02: qty 2

## 2022-03-02 MED ORDER — SODIUM CHLORIDE 0.9 % IV SOLN: Freq: Once | INTRAVENOUS | Status: AC

## 2022-03-02 MED ORDER — HEPARIN SOD (PORK) LOCK FLUSH 100 UNIT/ML IV SOLN
500.0000 [IU] | Freq: Once | INTRAVENOUS | Status: AC | PRN
  Administered 2022-03-02: 500 [IU]

## 2022-03-02 MED ORDER — SODIUM CHLORIDE 0.9 % IV SOLN
15.0000 mg/m2 | Freq: Once | INTRAVENOUS | Status: AC
  Administered 2022-03-02: 35 mg via INTRAVENOUS
  Filled 2022-03-02: qty 7

## 2022-03-02 MED ORDER — SODIUM CHLORIDE 0.9% FLUSH
10.0000 mL | INTRAVENOUS | Status: DC | PRN
  Administered 2022-03-02: 10 mL

## 2022-03-02 NOTE — Progress Notes (Signed)
Patient presents today for decitabine infusion per providers order.  Vital signs within parameters for treatment.  Patient has no new complaints at this time.    Treatment given today per MD orders.  Stable during infusion without adverse affects.  Vital signs stable.  No complaints at this time.  Discharge from clinic ambulatory in stable condition.  Alert and oriented X 3.  Follow up with [facility] as scheduled.

## 2022-03-02 NOTE — Patient Instructions (Signed)
[facility]  Discharge Instructions: Thank you for choosing [facility] to provide your oncology and hematology care.  If you have a lab appointment with the [facility], please come in thru the Main Entrance and check in at the main information desk.  Wear comfortable clothing and clothing appropriate for easy access to any Portacath or PICC line.   We strive to give you quality time with your provider. You may need to reschedule your appointment if you arrive late (15 or more minutes).  Arriving late affects you and other patients whose appointments are after yours.  Also, if you miss three or more appointments without notifying the office, you may be dismissed from the clinic at the provider's discretion.      For prescription refill requests, have your pharmacy contact our office and allow 72 hours for refills to be completed.    Today you received the following chemotherapy and/or immunotherapy agents Decitabine      To help prevent nausea and vomiting after your treatment, we encourage you to take your nausea medication as directed.  BELOW ARE SYMPTOMS THAT SHOULD BE REPORTED IMMEDIATELY: *FEVER GREATER THAN 100.4 F (38 C) OR HIGHER *CHILLS OR SWEATING *NAUSEA AND VOMITING THAT IS NOT CONTROLLED WITH YOUR NAUSEA MEDICATION *UNUSUAL SHORTNESS OF BREATH *UNUSUAL BRUISING OR BLEEDING *URINARY PROBLEMS (pain or burning when urinating, or frequent urination) *BOWEL PROBLEMS (unusual diarrhea, constipation, pain near the anus) TENDERNESS IN MOUTH AND THROAT WITH OR WITHOUT PRESENCE OF ULCERS (sore throat, sores in mouth, or a toothache) UNUSUAL RASH, SWELLING OR PAIN  UNUSUAL VAGINAL DISCHARGE OR ITCHING   Items with * indicate a potential emergency and should be followed up as soon as possible or go to the Emergency Department if any problems should occur.  Please show the CHEMOTHERAPY ALERT CARD or IMMUNOTHERAPY ALERT CARD at check-in to the  Emergency Department and triage nurse.  Should you have questions after your visit or need to cancel or reschedule your appointment, please contact [facility] [phone]  and follow the prompts.  Office hours are 8:00 a.m. to 4:30 p.m. Monday - Friday. Please note that voicemails left after 4:00 p.m. may not be returned until the following business day.  We are closed weekends and major holidays. You have access to a nurse at all times for urgent questions. Please call the main number to the clinic [phone] and follow the prompts.  For any non-urgent questions, you may also contact your provider using MyChart. We now offer e-Visits for anyone 69 and older to request care online for non-urgent symptoms. For details visit [email or web address].   Also download the MyChart app! Go to the app store, search "MyChart", open the app, select [facility], and log in with your MyChart username and password.  Masks are optional in the cancer centers. If you would like for your care team to wear a mask while they are taking care of you, please let them know. You may have one support person who is at least 77 years old accompany you for your appointments.

## 2022-03-03 ENCOUNTER — Inpatient Hospital Stay: Payer: Medicare Other

## 2022-03-03 VITALS — BP 139/82 | HR 71 | Temp 97.0°F | Resp 18

## 2022-03-03 DIAGNOSIS — C9200 Acute myeloblastic leukemia, not having achieved remission: Secondary | ICD-10-CM

## 2022-03-03 DIAGNOSIS — Z5111 Encounter for antineoplastic chemotherapy: Secondary | ICD-10-CM | POA: Diagnosis not present

## 2022-03-03 DIAGNOSIS — Z95828 Presence of other vascular implants and grafts: Secondary | ICD-10-CM

## 2022-03-03 DIAGNOSIS — D46Z Other myelodysplastic syndromes: Secondary | ICD-10-CM

## 2022-03-03 MED ORDER — SODIUM CHLORIDE 0.9% FLUSH
10.0000 mL | INTRAVENOUS | Status: DC | PRN
  Administered 2022-03-03: 10 mL

## 2022-03-03 MED ORDER — SODIUM CHLORIDE 0.9 % IV SOLN: Freq: Once | INTRAVENOUS | Status: AC

## 2022-03-03 MED ORDER — SODIUM CHLORIDE 0.9 % IV SOLN
15.0000 mg/m2 | Freq: Once | INTRAVENOUS | Status: AC
  Administered 2022-03-03: 35 mg via INTRAVENOUS
  Filled 2022-03-03: qty 7

## 2022-03-03 MED ORDER — HEPARIN SOD (PORK) LOCK FLUSH 100 UNIT/ML IV SOLN
500.0000 [IU] | Freq: Once | INTRAVENOUS | Status: AC | PRN
  Administered 2022-03-03: 500 [IU]

## 2022-03-03 MED ORDER — ONDANSETRON HCL 4 MG/2ML IJ SOLN
4.0000 mg | Freq: Once | INTRAMUSCULAR | Status: AC
  Administered 2022-03-03: 4 mg via INTRAVENOUS
  Filled 2022-03-03: qty 2

## 2022-03-03 NOTE — Patient Instructions (Signed)
[facility]  Discharge Instructions: Thank you for choosing [facility] to provide your oncology and hematology care.  If you have a lab appointment with the [facility], please come in thru the Main Entrance and check in at the main information desk.  Wear comfortable clothing and clothing appropriate for easy access to any Portacath or PICC line.   We strive to give you quality time with your provider. You may need to reschedule your appointment if you arrive late (15 or more minutes).  Arriving late affects you and other patients whose appointments are after yours.  Also, if you miss three or more appointments without notifying the office, you may be dismissed from the clinic at the provider's discretion.      For prescription refill requests, have your pharmacy contact our office and allow 72 hours for refills to be completed.    Today you received the following chemotherapy and/or immunotherapy agents Decitabine   To help prevent nausea and vomiting after your treatment, we encourage you to take your nausea medication as directed.  Decitabine Injection What is this medication? DECITABINE (dee SYE ta been) treats blood and bone marrow cancers. It works by slowing down the growth of cancer cells. This medicine may be used for other purposes; ask your health care provider or pharmacist if you have questions. COMMON BRAND NAME(S): Dacogen What should I tell my care team before I take this medication? They need to know if you have any of these conditions: Infection Kidney disease Liver disease An unusual or allergic reaction to decitabine, other medications, foods, dyes, or preservatives Pregnant or trying to get pregnant Breast-feeding How should I use this medication? This medication is infused into a vein. It is given by your care team in a hospital or clinic setting. Talk to your care team about the use of this medication in children. Special  care may be needed. Overdosage: If you think you have taken too much of this medicine contact a poison control center or emergency room at once. NOTE: This medicine is only for you. Do not share this medicine with others. What if I miss a dose? Keep appointments for follow-up doses. It is important not to miss your dose. Call your care team if you are unable to keep an appointment. What may interact with this medication? Interactions are not expected. This list may not describe all possible interactions. Give your health care provider a list of all the medicines, herbs, non-prescription drugs, or dietary supplements you use. Also tell them if you smoke, drink alcohol, or use illegal drugs. Some items may interact with your medicine. What should I watch for while using this medication? Visit your care team for regular checks on your progress. You may need blood work while taking this medication. This medication may make you feel generally unwell. This is not uncommon as chemotherapy can affect healthy cells as well as cancer cells. Report any side effects. Continue your course of treatment even though you feel ill unless your care team tells you to stop. This medication may increase your risk of getting an infection. Call your care team for advice if you get a fever, chills, sore throat, or other symptoms of a cold or flu. Do not treat yourself. Try to avoid being around people who are sick. Be careful brushing or flossing your teeth or using a toothpick because you may get an infection or bleed more easily. If you have any dental work done,  tell your dentist you are receiving this medication. Call your care team if you are around anyone with measles, chickenpox, or if you develop sores or blisters that do not heal properly. Avoid taking medications that contain aspirin, acetaminophen, ibuprofen, naproxen, or ketoprofen unless instructed by your care team. These medications may hide a fever. Talk to  your care team if you or your partner wish to become pregnant or think either of you might be pregnant. This medication can cause serious birth defects if taken during pregnancy or for 6 months after the last dose. A negative pregnancy test is required before starting this medication. A reliable form of contraception is recommended while taking this medication and for 6 months after the last dose. Talk to your care team about reliable forms of contraception. Do not father a child while taking this medication or for 3 months after the last dose. Use a condom while having sex during this time period. Do not breast-feed while taking this medication and for 2 weeks after the last dose. This medication may cause infertility. Talk to your care team if you are concerned about your fertility. What side effects may I notice from receiving this medication? Side effects that you should report to your care team as soon as possible: Allergic reactions--skin rash, itching, hives, swelling of the face, lips, tongue, or throat Infection--fever, chills, cough, sore throat, wounds that don't heal, pain or trouble when passing urine, general feeling of discomfort or being unwell Low red blood cell level--unusual weakness or fatigue, dizziness, headache, trouble breathing Unusual bruising or bleeding Side effects that usually do not require medical attention (report to your care team if they continue or are bothersome): Constipation Diarrhea Fatigue Nausea Pain, redness, or swelling with sores inside the mouth or throat Stomach pain This list may not describe all possible side effects. Call your doctor for medical advice about side effects. You may report side effects to FDA at 1-800-FDA-1088. Where should I keep my medication? This medication is given in a hospital or clinic. It will not be stored at home. NOTE: This sheet is a summary. It may not cover all possible information. If you have questions about this  medicine, talk to your doctor, pharmacist, or health care provider.  2023 Elsevier/Gold Standard (2021-08-01 00:00:00)    BELOW ARE SYMPTOMS THAT SHOULD BE REPORTED IMMEDIATELY: *FEVER GREATER THAN 100.4 F (38 C) OR HIGHER *CHILLS OR SWEATING *NAUSEA AND VOMITING THAT IS NOT CONTROLLED WITH YOUR NAUSEA MEDICATION *UNUSUAL SHORTNESS OF BREATH *UNUSUAL BRUISING OR BLEEDING *URINARY PROBLEMS (pain or burning when urinating, or frequent urination) *BOWEL PROBLEMS (unusual diarrhea, constipation, pain near the anus) TENDERNESS IN MOUTH AND THROAT WITH OR WITHOUT PRESENCE OF ULCERS (sore throat, sores in mouth, or a toothache) UNUSUAL RASH, SWELLING OR PAIN  UNUSUAL VAGINAL DISCHARGE OR ITCHING   Items with * indicate a potential emergency and should be followed up as soon as possible or go to the Emergency Department if any problems should occur.  Please show the CHEMOTHERAPY ALERT CARD or IMMUNOTHERAPY ALERT CARD at check-in to the Emergency Department and triage nurse.  Should you have questions after your visit or need to cancel or reschedule your appointment, please contact [facility] [phone]  and follow the prompts.  Office hours are 8:00 a.m. to 4:30 p.m. Monday - Friday. Please note that voicemails left after 4:00 p.m. may not be returned until the following business day.  We are closed weekends and major holidays. You have  access to a nurse at all times for urgent questions. Please call the main number to the clinic [phone] and follow the prompts.  For any non-urgent questions, you may also contact your provider using MyChart. We now offer e-Visits for anyone 68 and older to request care online for non-urgent symptoms. For details visit [email or web address].   Also download the MyChart app! Go to the app store, search "MyChart", open the app, select , and log in with your MyChart username and password.  Masks are optional in the cancer  centers. If you would like for your care team to wear a mask while they are taking care of you, please let them know. You may have one support person who is at least 77 years old accompany you for your appointments.

## 2022-03-03 NOTE — Progress Notes (Signed)
Pt presents today for D5 Decitabine per provider's order. Vital signs stable and pt voiced no new complaints at this time.  D5 Decitabine given today per MD orders. Tolerated infusion without adverse affects. Vital signs stable. No complaints at this time. Discharged from clinic via motorized chair in stable condition. Alert and oriented x 3. F/U with [facility] as scheduled.

## 2022-03-25 ENCOUNTER — Other Ambulatory Visit: Payer: Self-pay

## 2022-03-27 ENCOUNTER — Other Ambulatory Visit: Payer: Self-pay

## 2022-03-27 MED ORDER — OXYCODONE HCL 5 MG PO TABS: 5.0000 mg | ORAL_TABLET | Freq: Four times a day (QID) | ORAL | 0 refills | Status: DC | PRN

## 2022-04-03 ENCOUNTER — Other Ambulatory Visit (HOSPITAL_COMMUNITY): Payer: Self-pay

## 2022-04-06 ENCOUNTER — Other Ambulatory Visit (HOSPITAL_COMMUNITY): Payer: Self-pay

## 2022-04-07 ENCOUNTER — Other Ambulatory Visit: Payer: Self-pay

## 2022-04-07 DIAGNOSIS — D46Z Other myelodysplastic syndromes: Secondary | ICD-10-CM

## 2022-04-10 ENCOUNTER — Inpatient Hospital Stay: Payer: Medicare Other | Attending: Hematology

## 2022-04-10 ENCOUNTER — Inpatient Hospital Stay (HOSPITAL_BASED_OUTPATIENT_CLINIC_OR_DEPARTMENT_OTHER): Payer: Medicare Other | Admitting: Hematology

## 2022-04-10 ENCOUNTER — Inpatient Hospital Stay: Payer: Medicare Other

## 2022-04-10 DIAGNOSIS — M2550 Pain in unspecified joint: Secondary | ICD-10-CM | POA: Insufficient documentation

## 2022-04-10 DIAGNOSIS — C9200 Acute myeloblastic leukemia, not having achieved remission: Secondary | ICD-10-CM

## 2022-04-10 DIAGNOSIS — D46Z Other myelodysplastic syndromes: Secondary | ICD-10-CM

## 2022-04-10 DIAGNOSIS — Z7969 Long term (current) use of other immunomodulators and immunosuppressants: Secondary | ICD-10-CM | POA: Insufficient documentation

## 2022-04-10 DIAGNOSIS — Z95828 Presence of other vascular implants and grafts: Secondary | ICD-10-CM

## 2022-04-10 DIAGNOSIS — Z79899 Other long term (current) drug therapy: Secondary | ICD-10-CM | POA: Diagnosis not present

## 2022-04-10 DIAGNOSIS — Z5111 Encounter for antineoplastic chemotherapy: Secondary | ICD-10-CM | POA: Insufficient documentation

## 2022-04-10 DIAGNOSIS — M7989 Other specified soft tissue disorders: Secondary | ICD-10-CM | POA: Insufficient documentation

## 2022-04-10 DIAGNOSIS — I129 Hypertensive chronic kidney disease with stage 1 through stage 4 chronic kidney disease, or unspecified chronic kidney disease: Secondary | ICD-10-CM | POA: Insufficient documentation

## 2022-04-10 DIAGNOSIS — F0390 Unspecified dementia without behavioral disturbance: Secondary | ICD-10-CM | POA: Insufficient documentation

## 2022-04-10 DIAGNOSIS — Z87891 Personal history of nicotine dependence: Secondary | ICD-10-CM | POA: Insufficient documentation

## 2022-04-10 DIAGNOSIS — N183 Chronic kidney disease, stage 3 unspecified: Secondary | ICD-10-CM | POA: Diagnosis not present

## 2022-04-10 DIAGNOSIS — K5900 Constipation, unspecified: Secondary | ICD-10-CM | POA: Insufficient documentation

## 2022-04-10 LAB — CBC WITH DIFFERENTIAL/PLATELET
Abs Immature Granulocytes: 0.07 10*3/uL (ref 0.00–0.07)
Basophils Absolute: 0.1 10*3/uL (ref 0.0–0.1)
Basophils Relative: 1 %
Eosinophils Absolute: 0.1 10*3/uL (ref 0.0–0.5)
Eosinophils Relative: 2 %
HCT: 46.5 % (ref 39.0–52.0)
Hemoglobin: 15.6 g/dL (ref 13.0–17.0)
Immature Granulocytes: 1 %
Lymphocytes Relative: 21 %
Lymphs Abs: 1 10*3/uL (ref 0.7–4.0)
MCH: 31.1 pg (ref 26.0–34.0)
MCHC: 33.5 g/dL (ref 30.0–36.0)
MCV: 92.8 fL (ref 80.0–100.0)
Monocytes Absolute: 0.4 10*3/uL (ref 0.1–1.0)
Monocytes Relative: 8 %
Neutro Abs: 3.3 10*3/uL (ref 1.7–7.7)
Neutrophils Relative %: 67 %
Platelets: 156 10*3/uL (ref 150–400)
RBC: 5.01 MIL/uL (ref 4.22–5.81)
RDW: 13.9 % (ref 11.5–15.5)
WBC: 5 10*3/uL (ref 4.0–10.5)
nRBC: 0 % (ref 0.0–0.2)

## 2022-04-10 LAB — COMPREHENSIVE METABOLIC PANEL
ALT: 29 U/L (ref 0–44)
AST: 24 U/L (ref 15–41)
Albumin: 3.8 g/dL (ref 3.5–5.0)
Alkaline Phosphatase: 71 U/L (ref 38–126)
Anion gap: 9 (ref 5–15)
BUN: 18 mg/dL (ref 8–23)
CO2: 25 mmol/L (ref 22–32)
Calcium: 9.4 mg/dL (ref 8.9–10.3)
Chloride: 108 mmol/L (ref 98–111)
Creatinine, Ser: 1 mg/dL (ref 0.61–1.24)
GFR, Estimated: >60 mL/min (ref >60)
Glucose, Bld: 106 mg/dL — ABNORMAL HIGH (ref 70–99)
Potassium: 4.2 mmol/L (ref 3.5–5.1)
Sodium: 142 mmol/L (ref 135–145)
Total Bilirubin: 1 mg/dL (ref 0.3–1.2)
Total Protein: 6.5 g/dL (ref 6.5–8.1)

## 2022-04-10 MED ORDER — SODIUM CHLORIDE 0.9% FLUSH
10.0000 mL | INTRAVENOUS | Status: DC | PRN
  Administered 2022-04-10: 10 mL

## 2022-04-10 MED ORDER — HEPARIN SOD (PORK) LOCK FLUSH 100 UNIT/ML IV SOLN
500.0000 [IU] | Freq: Once | INTRAVENOUS | Status: AC | PRN
  Administered 2022-04-10: 500 [IU]

## 2022-04-10 MED ORDER — SODIUM CHLORIDE 0.9 % IV SOLN
15.0000 mg/m2 | Freq: Once | INTRAVENOUS | Status: AC
  Administered 2022-04-10: 35 mg via INTRAVENOUS
  Filled 2022-04-10: qty 7

## 2022-04-10 MED ORDER — SODIUM CHLORIDE 0.9 % IV SOLN: Freq: Once | INTRAVENOUS | Status: AC

## 2022-04-10 MED ORDER — ONDANSETRON HCL 4 MG/2ML IJ SOLN
4.0000 mg | Freq: Once | INTRAMUSCULAR | Status: AC
  Administered 2022-04-10: 4 mg via INTRAVENOUS
  Filled 2022-04-10: qty 2

## 2022-04-10 NOTE — Progress Notes (Signed)
Patient is taking venetoclax as prescribed.  He has not missed any doses and reports no side effects at this time.    Patient has been examined by Dr. Katragadda, and vital signs and labs have been reviewed. ANC, Creatinine, LFTs, hemoglobin, and platelets are within treatment parameters per M.D. - pt may proceed with treatment.  Primary RN and pharmacy notified.  

## 2022-04-10 NOTE — Patient Instructions (Signed)
[facility] at [facility] Discharge Instructions   You were seen and examined today by Dr. Delton Coombes.  He reviewed your lab work which is normal/stable.   We will proceed with your treatment today.  Continue venetoclax as prescribed.   Return as scheduled.    Thank you for choosing [facility] at [facility] to provide your oncology and hematology care.  To afford each patient quality time with our provider, please arrive at least 15 minutes before your scheduled appointment time.   If you have a lab appointment with the [facility] please come in thru the Main Entrance and check in at the main information desk.  You need to re-schedule your appointment should you arrive 10 or more minutes late.  We strive to give you quality time with our providers, and arriving late affects you and other patients whose appointments are after yours.  Also, if you no show three or more times for appointments you may be dismissed from the clinic at the providers discretion.     Again, thank you for choosing [facility].  Our hope is that these requests will decrease the amount of time that you wait before being seen by our physicians.       _____________________________________________________________  Should you have questions after your visit to [facility], please contact our office at [phone] and follow the prompts.  Our office hours are 8:00 a.m. and 4:30 p.m. Monday - Friday.  Please note that voicemails left after 4:00 p.m. may not be returned until the following business day.  We are closed weekends and major holidays.  You do have access to a nurse 24-7, just call the main number to the clinic [phone] and do not press any options, hold on the line and a nurse will answer the phone.    For prescription refill requests, have your pharmacy contact our office and allow 72 hours.    Due to Covid, you will need to wear  a mask upon entering the hospital. If you do not have a mask, a mask will be given to you at the Main Entrance upon arrival. For doctor visits, patients may have 1 support person age 43 or older with them. For treatment visits, patients can not have anyone with them due to social distancing guidelines and our immunocompromised population.

## 2022-04-10 NOTE — Patient Instructions (Signed)
[facility]  Discharge Instructions: Thank you for choosing [facility] to provide your oncology and hematology care.  If you have a lab appointment with the [facility], please come in thru the Main Entrance and check in at the main information desk.  Wear comfortable clothing and clothing appropriate for easy access to any Portacath or PICC line.   We strive to give you quality time with your provider. You may need to reschedule your appointment if you arrive late (15 or more minutes).  Arriving late affects you and other patients whose appointments are after yours.  Also, if you miss three or more appointments without notifying the office, you may be dismissed from the clinic at the provider's discretion.      For prescription refill requests, have your pharmacy contact our office and allow 72 hours for refills to be completed.    Today you received the following chemotherapy and/or immunotherapy agents Decitabine. Decitabine; Cedazuridine Tablets What is this medication? DECITABINE; CEDAZURIDINE (dee SYE ta been; sed az ure i deen) treats blood and bone marrow cancers. It works by slowing down the growth of cancer cells. This medicine may be used for other purposes; ask your health care provider or pharmacist if you have questions. COMMON BRAND NAME(S): INQOVI What should I tell my care team before I take this medication? They need to know if you have any of these conditions: Infection Kidney disease Liver disease An unusual or allergic reaction to decitabine, cedazuridine, other medications, foods, dyes, or preservatives Pregnant or trying to get pregnant Breast-feeding How should I use this medication? Take this medication by mouth. Take it as directed on the prescription label at the same time every day. Do not cut, crush, or chew this medication. Swallow the tablets whole. Take it on an empty stomach, at least 2 hours before or 2 hours after  food. Do not take it more often than directed. Your care team may change your dose or tell you stop taking this medication if you get side effects. Do not change your dose or stop taking it unless your care team tells you to. This medication is taken in "cycles." There will be days you do not take it. Talk to your care team if you have questions about when to take your medication. It is very important to follow the exact schedule. Taking it more often than directed can cause serious side effects. Talk to your care team about the use of this medication in children. Special care may be needed. Overdosage: If you think you have taken too much of this medicine contact a poison control center or emergency room at once. NOTE: This medicine is only for you. Do not share this medicine with others. What if I miss a dose? If you miss a dose, take it as soon as you can unless it is more than 12 hours late. If it is more than 12 hours late, skip the missed dose. Take the next dose at the normal time. Do not take double or extra doses. What may interact with this medication? This medication may affect how other medications work, and other medications may affect the way this medication works. Talk with your care team about all of the medications you take. They may suggest changes to your treatment plan to lower the risk of side effects and to make sure your medications work as intended. This list may not describe all possible interactions. Give your health care provider a  list of all the medicines, herbs, non-prescription drugs, or dietary supplements you use. Also tell them if you smoke, drink alcohol, or use illegal drugs. Some items may interact with your medicine. What should I watch for while using this medication? Visit your care team for regular checks on your progress. You may need blood work while taking this medication. This medication may make you feel generally unwell. This is not uncommon as chemotherapy  can affect healthy cells as well as cancer cells. Report any side effects. Continue your course of treatment even though you feel ill unless your care team tells you to stop. This medication may increase your risk of getting an infection. Call your care team for advice if you get a fever, chills, sore throat, or other symptoms of a cold or flu. Do not treat yourself. Try to avoid being around people who are sick. Be careful brushing or flossing your teeth or using a toothpick because you may get an infection or bleed more easily. If you have any dental work done, tell your dentist you are receiving this medication. Call your care team if you are around anyone with measles, chickenpox, or if you develop sores or blisters that do not heal properly. Avoid taking medications that contain aspirin, acetaminophen, ibuprofen, naproxen, or ketoprofen unless instructed by your care team. These medications may hide a fever. Talk to your care team if you or your partner wish to become pregnant or think either of you might be pregnant. This medication can cause serious birth defects if taken during pregnancy or for 6 months after stopping therapy. A negative pregnancy test is required before starting this medication. A reliable form of contraception is recommended while taking this medication and for 6 months after stopping therapy. Talk to your care team about reliable forms of contraception. Use a condom during sex and for 3 months after stopping therapy. Tell your care team right away if you think your partner might be pregnant. This medication can cause serious birth defects. Do not breast-feed while taking this medication and for 2 weeks after stopping therapy. This medication may cause infertility. Talk to your care team if you are concerned about your fertility. What side effects may I notice from receiving this medication? Side effects that you should report to your care team as soon as possible: Allergic  reactions--skin rash, itching, hives, swelling of the face, lips, tongue, or throat Infection--fever, chills, cough, sore throat, wounds that don't heal, pain or trouble when passing urine, general feeling of discomfort or being unwell Low red blood cell level--unusual weakness or fatigue, dizziness, headache, trouble breathing Unusual bruising or bleeding Side effects that usually do not require medical attention (report to your care team if they continue or are bothersome): Constipation Diarrhea Fatigue Nausea Pain, redness, or swelling with sores inside the mouth or throat Stomach pain This list may not describe all possible side effects. Call your doctor for medical advice about side effects. You may report side effects to FDA at 1-800-FDA-1088. Where should I keep my medication? Keep out of the reach of children and pets. Store between 20 and 25 degrees C (68 and 77 degrees F). Get rid of any unused medication after the expiration date. To get rid of medications that are no longer needed or have expired: Take the medication to a medication take-back program. Check with your pharmacy or law enforcement to find a location. If you cannot return the medication, ask your pharmacist or care team how to get  rid of this medication safely. NOTE: This sheet is a summary. It may not cover all possible information. If you have questions about this medicine, talk to your doctor, pharmacist, or health care provider.  2023 Elsevier/Gold Standard (2021-08-01 00:00:00)       To help prevent nausea and vomiting after your treatment, we encourage you to take your nausea medication as directed.  BELOW ARE SYMPTOMS THAT SHOULD BE REPORTED IMMEDIATELY: *FEVER GREATER THAN 100.4 F (38 C) OR HIGHER *CHILLS OR SWEATING *NAUSEA AND VOMITING THAT IS NOT CONTROLLED WITH YOUR NAUSEA MEDICATION *UNUSUAL SHORTNESS OF BREATH *UNUSUAL BRUISING OR BLEEDING *URINARY PROBLEMS (pain or burning when urinating, or  frequent urination) *BOWEL PROBLEMS (unusual diarrhea, constipation, pain near the anus) TENDERNESS IN MOUTH AND THROAT WITH OR WITHOUT PRESENCE OF ULCERS (sore throat, sores in mouth, or a toothache) UNUSUAL RASH, SWELLING OR PAIN  UNUSUAL VAGINAL DISCHARGE OR ITCHING   Items with * indicate a potential emergency and should be followed up as soon as possible or go to the Emergency Department if any problems should occur.  Please show the CHEMOTHERAPY ALERT CARD or IMMUNOTHERAPY ALERT CARD at check-in to the Emergency Department and triage nurse.  Should you have questions after your visit or need to cancel or reschedule your appointment, please contact [facility] [phone]  and follow the prompts.  Office hours are 8:00 a.m. to 4:30 p.m. Monday - Friday. Please note that voicemails left after 4:00 p.m. may not be returned until the following business day.  We are closed weekends and major holidays. You have access to a nurse at all times for urgent questions. Please call the main number to the clinic [phone] and follow the prompts.  For any non-urgent questions, you may also contact your provider using MyChart. We now offer e-Visits for anyone 54 and older to request care online for non-urgent symptoms. For details visit [email or web address].   Also download the MyChart app! Go to the app store, search "MyChart", open the app, select [facility], and log in with your MyChart username and password.  Masks are optional in the cancer centers. If you would like for your care team to wear a mask while they are taking care of you, please let them know. You may have one support person who is at least 77 years old accompany you for your appointments.

## 2022-04-10 NOTE — Progress Notes (Signed)
Message received from A. Ouida Sills RN / Dr. Delton Coombes to proceed with treatment. MAR reviewed and updated. Patient denies any side effects related to last treatment. No complaints today of any changes since his last treatment.   Decitabine given today per MD orders. Tolerated infusion without adverse affects. Vital signs stable. No complaints at this time. Discharged from clinic ambulatory in stable condition. Alert and oriented x 3. F/U with [facility] as scheduled.

## 2022-04-10 NOTE — Progress Notes (Signed)
Richard Wallace,  [id number]   CLINIC:  Medical Oncology/Hematology  PCP:  Richard Sos, MD [address] 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[place])   08/22/2018 - 12/06/2018 Chemotherapy   The patient had palonosetron (ALOXI) injection 0.25 mg, 0.25 mg, Intravenous,  Once, 4 of 6 cycles Administration: 0.25 mg (08/22/2018), 0.25 mg (08/26/2018), 0.25 mg (08/28/2018), 0.25 mg (08/30/2018), 0.25 mg (10/01/2018), 0.25 mg (10/02/2018), 0.25 mg (11/04/2018), 0.25 mg (09/23/2018), 0.25 mg (09/25/2018), 0.25 mg (09/27/2018), 0.25 mg (10/28/2018), 0.25 mg (10/30/2018), 0.25 mg (11/01/2018), 0.25 mg (12/02/2018), 0.25 mg (12/04/2018), 0.25 mg (12/06/2018) azaCITIDine (VIDAZA) 100 mg in sodium chloride 0.9 % 50 mL chemo infusion, 110 mg (66.7 % of original dose 75 mg/m2), Intravenous, Once, 4 of 6 cycles Dose modification: 50 mg/m2 (original dose 75 mg/m2, Cycle 1, Reason: Provider Judgment), 50 mg/m2 (original dose 75 mg/m2, Cycle 2, Reason: Provider Judgment) Administration: 100 mg (08/22/2018), 100 mg (08/23/2018), 100 mg (08/26/2018), 100 mg (08/27/2018), 100 mg (08/28/2018), 100 mg (08/29/2018), 100 mg (08/30/2018), 100 mg (10/01/2018), 100 mg (10/02/2018), 100 mg (11/04/2018), 100 mg (11/05/2018), 100 mg (09/23/2018), 100 mg (09/24/2018), 100 mg (09/25/2018), 100 mg (09/26/2018), 100 mg (09/27/2018), 100 mg (10/28/2018), 100 mg (10/29/2018), 100 mg (10/30/2018), 100 mg (10/31/2018), 100 mg (11/01/2018), 100 mg (12/02/2018), 100 mg (12/03/2018), 100 mg (12/04/2018), 100 mg (12/05/2018), 100 mg (12/06/2018)  for chemotherapy treatment.    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  INTERVAL HISTORY:  Richard Wallace, a 76 y.o. male, seen for follow-up of acute myeloid leukemia and toxicity assessment prior to next cycle of chemotherapy.  Reports energy levels are 50%.  Denies any fevers or infections in the last 6 weeks.  Has constipation which is managed with lactulose.  Knee arthritis is stable with pain medication.  REVIEW OF SYSTEMS:  Review of Systems  Gastrointestinal:  Positive for constipation. Negative for vomiting.  Musculoskeletal:  Positive for arthralgias (knee).  All other systems reviewed and are negative.   PAST MEDICAL/SURGICAL HISTORY:  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   Cancer ([place]) 01-28-2014   skin cancer   CKD (chronic kidney disease), stage III (HCC)    GERD (gastroesophageal reflux disease)    Heart murmur    NOTED DURING PHYSICAL WHEN HE WAS ENLISTING IN [facility] , DIDNT KNOW UNTIL THAT TIME AND REPORTS , "THATS THE LAST I HEARD ABOUT  IT "    History of hypertension    no longer issue   History of kidney stones    History of malignant melanoma of skin    excision top of scalp 2015-- no recurrence   History of urinary retention    post op lumbar fusion surgery 04/ 2016   Hypertension    Kidney dysfunction    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, MONITORED BY [facility] DR Annie Main  [facility]    Left ureteral calculus    Seasonal allergies    Wears glasses    Wears glasses    Wears partial dentures    upper and lower   Past Surgical History:  Procedure Laterality Date   ANKLE FUSION Right 2007   CARPAL TUNNEL RELEASE Left 12/28/2009   w/ pulley release left long finger   CARPAL TUNNEL RELEASE Right 07/22/2013   Procedure: RIGHT CARPAL TUNNEL RELEASE;  Surgeon: Cammie Sickle., MD;  Location: [facility];  Service: Orthopedics;  Laterality: Right;   COLONOSCOPY     CYSTO/  LEFT RETROGRADE PYELOGRAM  11/21/2010   CYSTOSCOPY WITH STENT PLACEMENT Left 03/09/2016   Procedure: CYSTOSCOPY WITH STENT PLACEMENT;  Surgeon: Franchot Gallo, MD;  Location: [facility];  Service: Urology;  Laterality: Left;   CYSTOSCOPY/RETROGRADE/URETEROSCOPY/STONE EXTRACTION WITH BASKET Left 03/09/2016   Procedure: CYSTOSCOPY/RETROGRADE/URETEROSCOPY/STONE EXTRACTION WITH BASKET;  Surgeon: Franchot Gallo, MD;  Location: [facility];  Service: Urology;  Laterality: Left;   LEFT URETEROSCOPIC LASER LITHOTRIPSY STONE EXTRACTION/ STENT PLACEMENT  05/23/2010   MOHS SURGERY     TOP OF THE HEAD    ORIF ANKLE FRACTURE Right 1978   PORT-A-CATH REMOVAL Right 02/14/2019   Procedure: MINOR REMOVAL PORT-A-CATH;  Surgeon: Aviva Signs, MD;  Location: [facility];  Service: General;  Laterality: Right;   PORTACATH PLACEMENT Right 08/19/2018   Procedure: INSERTION PORT-A-CATH (attached catheter in right subclavian);  Surgeon: Aviva Signs, MD;  Location: [facility];  Service: General;  Laterality: Right;   PORTACATH PLACEMENT Left 05/16/2019   Procedure: INSERTION PORT-A-CATH (attached catheter in left subclavian);  Surgeon: Aviva Signs, MD;  Location: [facility];  Service: General;  Laterality: Left;   POSTERIOR LUMBAR FUSION  08/21/2014   laminectomy and decompression L2 -- L5   RIGHT LOWER LEG SURGERY  X3  1975 to 1976   including ORIF   TONSILLECTOMY AND  ADENOIDECTOMY  9937   UMBILICAL HERNIA REPAIR  2009 approx    SOCIAL HISTORY:  Social History   Socioeconomic History   Marital status: Married    Spouse name: Not on file   Number of children: Not on file   Years of education: Not on file   Highest education level: Not on file  Occupational History   Not on file  Tobacco Use   Smoking status: Former    Years: 20.00    Types: Cigarettes    Quit date: 07/17/1986    Years since quitting: 35.7   Smokeless tobacco: Never  Vaping Use   Vaping Use: Never used  Substance and Sexual Activity   Alcohol use: Yes    Alcohol/week: 7.0 - 14.0 standard drinks of alcohol    Types: 7 - 14 Cans of beer per week    Comment: 1 -2 beer daily   Drug use: No   Sexual activity: Not Currently  Other Topics Concern   Not on file  Social History Narrative   Not on file   Social Determinants of Health  Financial Resource Strain: Low Risk  (03/23/2020)   Overall Financial Resource Strain (CARDIA)    Difficulty of Paying Living Expenses: Not hard at all  Food Insecurity: No Food Insecurity (03/23/2020)   Hunger Vital Sign    Worried About Running Out of Food in the Last Year: Never true    Ran Out of Food in the Last Year: Never true  Transportation Needs: No Transportation Needs (03/23/2020)   PRAPARE - Hydrologist (Medical): No    Lack of Transportation (Non-Medical): No  Physical Activity: Unknown (02/17/2019)   Exercise Vital Sign    Days of Exercise per Week: Patient refused    Minutes of Exercise per Session: Patient refused  Stress: No Stress Concern Present (03/23/2020)   [place]    Feeling of Stress : Not at all  Social Connections: Moderately Isolated (03/23/2020)   Social Connection and Isolation Panel [NHANES]    Frequency of Communication with Friends and Family: Twice a week    Frequency of Social Gatherings with Friends and  Family: Never    Attends Religious Services: Never    Marine scientist or Organizations: Yes    Attends Music therapist: More than 4 times per year    Marital Status: Married  Human resources officer Violence: Unknown (02/17/2019)   Humiliation, Afraid, Rape, and Kick questionnaire    Fear of Current or Ex-Partner: Patient refused    Emotionally Abused: Patient refused    Physically Abused: Patient refused    Sexually Abused: Patient refused    FAMILY HISTORY:  Family History  Problem Relation Age of Onset   Stroke Mother    Prostate cancer Father    Bone cancer Father    Diverticulitis Father    Rheum arthritis Sister    Urinary tract infection Sister    Colon cancer Neg Hx     CURRENT MEDICATIONS:  Current Outpatient Medications  Medication Sig Dispense Refill   ACETAMINOPHEN EXTRA STRENGTH 500 MG capsule      allopurinol (ZYLOPRIM) 300 MG tablet Take 300 mg by mouth daily.     ALPRAZolam (XANAX) 0.25 MG tablet Take 1 tablet by mouth twice daily as needed for anxiety 60 tablet 0   amLODipine (NORVASC) 2.5 MG tablet Take 1 tablet by mouth once daily 90 tablet 0   cetirizine (ZYRTEC) 10 MG tablet Take 10 mg by mouth daily. IN THE MORNING     diclofenac Sodium (VOLTAREN) 1 % GEL Apply three times daily to knees as needed for pain 50 g 3   docusate sodium (COLACE) 100 MG capsule Take 100 mg by mouth at bedtime.      donepezil (ARICEPT) 10 MG tablet TAKE 1 TABLET BY MOUTH AT BEDTIME 90 tablet 3   furosemide (LASIX) 20 MG tablet Take 1 tablet (20 mg total) by mouth daily as needed. 30 tablet 2   ipratropium (ATROVENT) 0.03 % nasal spray Place 2 sprays into both nostrils 3 (three) times daily. 30 mL 6   lansoprazole (PREVACID) 15 MG capsule Take 15 mg by mouth daily.      magnesium oxide (MAG-OX) 400 (240 Mg) MG tablet TAKE 1 TABLET BY MOUTH THREE TIMES DAILY 90 tablet 3   oxyCODONE (OXY IR/ROXICODONE) 5 MG immediate release tablet Take 1-2 tablets (5-10 mg total) by  mouth every 6 (six) hours as needed. 120 tablet 0   SF 5000 PLUS 1.1 % CREA dental cream SMARTSIG:Sparingly  By Mouth Every Night     tamsulosin (FLOMAX) 0.4 MG CAPS capsule TAKE 1 CAPSULE BY MOUTH IN THE EVENING 30 capsule 6   venetoclax (VENCLEXTA) 100 MG tablet TAKE 2 TABLETS (200 MG) BY MOUTH DAILY 60 tablet 2   lidocaine-prilocaine (EMLA) cream Apply 1 application topically See admin instructions. ONE HOUR PRIOR TO CHEMOTHERAPY APPOINTMENT (Patient not taking: Reported on 04/10/2022) 30 g 6   No current facility-administered medications for this visit.   Facility-Administered Medications Ordered in Other Visits  Medication Dose Route Frequency Provider Last Rate Last Admin   decitabine (DACOGEN) 35 mg in sodium chloride 0.9 % 50 mL chemo infusion  15 mg/m2 (Treatment Plan Recorded) Intravenous Once [name], MD 57 mL/hr at 04/10/22 1135 35 mg at 04/10/22 1135   heparin lock flush 100 unit/mL  500 Units Intracatheter Once PRN [name], MD       octreotide (SANDOSTATIN LAR) 30 MG IM injection            sodium chloride flush (NS) 0.9 % injection 10 mL  10 mL Intracatheter PRN [name], MD       sodium chloride flush (NS) 0.9 % injection 20 mL  20 mL Intravenous PRN [name], MD   20 mL at 07/21/19 1053    ALLERGIES:  Not on File  PHYSICAL EXAM:  Performance status (ECOG): 1 - Symptomatic but completely ambulatory  Vitals:   04/10/22 0944  BP: (!) 146/87  Pulse: 68  Resp: 17  Temp: (!) 97.5 F (36.4 C)  SpO2: 96%    Wt Readings from Last 3 Encounters:  04/10/22 222 lb 1.6 oz (100.7 kg)  02/28/22 224 lb (101.6 kg)  02/27/22 224 lb 3.2 oz (101.7 kg)   Physical Exam Vitals reviewed.  Constitutional:      Appearance: Normal appearance. He is obese.     Comments: In wheelchair  Cardiovascular:     Rate and Rhythm: Normal rate and regular rhythm.     Pulses: Normal pulses.     Heart sounds: Normal heart sounds.  Pulmonary:      Effort: Pulmonary effort is normal.     Breath sounds: Normal breath sounds.  Musculoskeletal:     Right lower leg: 3+ Edema present.     Left lower leg: Edema (trace) present.  Neurological:     General: No focal deficit present.     Mental Status: He is alert and oriented to person, place, and time.  Psychiatric:        Mood and Affect: Mood normal.        Behavior: Behavior normal.     LABORATORY DATA:  I have reviewed the labs as listed.     Latest Ref Rng & Units 04/10/2022    9:20 AM 02/27/2022   10:00 AM 01/16/2022    9:53 AM  CBC  WBC 4.0 - 10.5 K/uL 5.0  4.0  5.5   Hemoglobin 13.0 - 17.0 g/dL 15.6  15.2  15.5   Hematocrit 39.0 - 52.0 % 46.5  46.0  46.5   Platelets 150 - 400 K/uL 156  124  124       Latest Ref Rng & Units 04/10/2022    9:20 AM 02/27/2022   10:00 AM 01/16/2022    9:53 AM  CMP  Glucose 70 - 99 mg/dL 106  108  109   BUN 8 - 23 mg/dL '18  18  23   '$ Creatinine 0.61 - 1.24 mg/dL 1.00  0.94  0.92   Sodium 135 - 145 mmol/L 142  142  142   Potassium 3.5 - 5.1 mmol/L 4.2  4.5  4.2   Chloride 98 - 111 mmol/L 108  107  109   CO2 22 - 32 mmol/L '25  27  25   '$ Calcium 8.9 - 10.3 mg/dL 9.4  9.2  9.4   Total Protein 6.5 - 8.1 g/dL 6.5  6.2  6.4   Total Bilirubin 0.3 - 1.2 mg/dL 1.0  0.6  0.7   Alkaline Phos 38 - 126 U/L 71  60  60   AST 15 - 41 U/L '24  25  22   '$ ALT 0 - 44 U/L '29  29  29     '$ DIAGNOSTIC IMAGING:  I have independently reviewed the scans and discussed with the patient. No results found.   ASSESSMENT:  1.  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  -BMBX on 02/25/2019 after cycle 1 did not show any evidence of leukemia. -CT CAP on 02/12/2019 shows numerous bilateral irregular/spiculated pulmonary nodules measuring up to 14 mm, nonspecific.  Hepatic steatosis.  Spleen is normal. -I have talked to Dr.Ravandi at [facility] per patient request.  There are  clinical trials available upon progression of his AML.  There are also some clinical trials available now based on MRD positivity.  Patient not interested in moving to [place] at this time.   PLAN:  1.  AML: - Reviewed labs today which showed normal LFTs.  Creatinine is 1.0.  CBC was grossly normal. - Proceed with next cycle of decitabine and venetoclax.  He will take venetoclax 200 mg daily for the next 2 weeks and stop.  He will receive decitabine for 5 days starting today. - RTC 6 weeks for follow-up with repeat labs.   2.  Arthralgias: - Continue hydrocodone 10/325 every 6 hours as needed.   3.  Hypomagnesemia: - Continue magnesium 3 times daily.   4.  Hypertension: - Continue amlodipine 2.5 mg daily.  Blood pressure is well-controlled.   5.  Dementia: - Wife reports worsening of recent memory.  Continue Aricept 10 mg daily.  6.  Right leg swelling: - Continue Lasix 20 mg as needed in the mornings.   Orders placed this encounter:  No orders of the defined types were placed in this encounter.     [name], MD [place] [phone]

## 2022-04-11 ENCOUNTER — Inpatient Hospital Stay: Payer: Medicare Other

## 2022-04-11 ENCOUNTER — Other Ambulatory Visit: Payer: Self-pay

## 2022-04-11 VITALS — BP 116/76 | HR 57 | Temp 97.9°F | Resp 16

## 2022-04-11 DIAGNOSIS — C9200 Acute myeloblastic leukemia, not having achieved remission: Secondary | ICD-10-CM

## 2022-04-11 DIAGNOSIS — D46Z Other myelodysplastic syndromes: Secondary | ICD-10-CM

## 2022-04-11 DIAGNOSIS — Z5111 Encounter for antineoplastic chemotherapy: Secondary | ICD-10-CM | POA: Diagnosis not present

## 2022-04-11 DIAGNOSIS — Z95828 Presence of other vascular implants and grafts: Secondary | ICD-10-CM

## 2022-04-11 MED ORDER — HEPARIN SOD (PORK) LOCK FLUSH 100 UNIT/ML IV SOLN
500.0000 [IU] | Freq: Once | INTRAVENOUS | Status: AC | PRN
  Administered 2022-04-11: 500 [IU]

## 2022-04-11 MED ORDER — SODIUM CHLORIDE 0.9 % IV SOLN
15.0000 mg/m2 | Freq: Once | INTRAVENOUS | Status: AC
  Administered 2022-04-11: 35 mg via INTRAVENOUS
  Filled 2022-04-11: qty 7

## 2022-04-11 MED ORDER — ONDANSETRON HCL 4 MG/2ML IJ SOLN
4.0000 mg | Freq: Once | INTRAMUSCULAR | Status: AC
  Administered 2022-04-11: 4 mg via INTRAVENOUS
  Filled 2022-04-11: qty 2

## 2022-04-11 MED ORDER — SODIUM CHLORIDE 0.9% FLUSH
10.0000 mL | INTRAVENOUS | Status: DC | PRN
  Administered 2022-04-11: 10 mL

## 2022-04-11 MED ORDER — SODIUM CHLORIDE 0.9 % IV SOLN: Freq: Once | INTRAVENOUS | Status: AC

## 2022-04-11 NOTE — Patient Instructions (Signed)
[facility]  Discharge Instructions: Thank you for choosing [facility] to provide your oncology and hematology care.  If you have a lab appointment with the [facility], please come in thru the Main Entrance and check in at the main information desk.  Wear comfortable clothing and clothing appropriate for easy access to any Portacath or PICC line.   We strive to give you quality time with your provider. You may need to reschedule your appointment if you arrive late (15 or more minutes).  Arriving late affects you and other patients whose appointments are after yours.  Also, if you miss three or more appointments without notifying the office, you may be dismissed from the clinic at the provider's discretion.      For prescription refill requests, have your pharmacy contact our office and allow 72 hours for refills to be completed.    Today you received the following chemotherapy and/or immunotherapy agents Decitabine, return as scheduled.   To help prevent nausea and vomiting after your treatment, we encourage you to take your nausea medication as directed.  BELOW ARE SYMPTOMS THAT SHOULD BE REPORTED IMMEDIATELY: *FEVER GREATER THAN 100.4 F (38 C) OR HIGHER *CHILLS OR SWEATING *NAUSEA AND VOMITING THAT IS NOT CONTROLLED WITH YOUR NAUSEA MEDICATION *UNUSUAL SHORTNESS OF BREATH *UNUSUAL BRUISING OR BLEEDING *URINARY PROBLEMS (pain or burning when urinating, or frequent urination) *BOWEL PROBLEMS (unusual diarrhea, constipation, pain near the anus) TENDERNESS IN MOUTH AND THROAT WITH OR WITHOUT PRESENCE OF ULCERS (sore throat, sores in mouth, or a toothache) UNUSUAL RASH, SWELLING OR PAIN  UNUSUAL VAGINAL DISCHARGE OR ITCHING   Items with * indicate a potential emergency and should be followed up as soon as possible or go to the Emergency Department if any problems should occur.  Please show the CHEMOTHERAPY ALERT CARD or IMMUNOTHERAPY ALERT CARD at  check-in to the Emergency Department and triage nurse.  Should you have questions after your visit or need to cancel or reschedule your appointment, please contact [facility] [phone]  and follow the prompts.  Office hours are 8:00 a.m. to 4:30 p.m. Monday - Friday. Please note that voicemails left after 4:00 p.m. may not be returned until the following business day.  We are closed weekends and major holidays. You have access to a nurse at all times for urgent questions. Please call the main number to the clinic [phone] and follow the prompts.  For any non-urgent questions, you may also contact your provider using MyChart. We now offer e-Visits for anyone 13 and older to request care online for non-urgent symptoms. For details visit [email or web address].   Also download the MyChart app! Go to the app store, search "MyChart", open the app, select [facility], and log in with your MyChart username and password.  Masks are optional in the cancer centers. If you would like for your care team to wear a mask while they are taking care of you, please let them know. You may have one support person who is at least 77 years old accompany you for your appointments.

## 2022-04-11 NOTE — Progress Notes (Signed)
Patient tolerated chemotherapy with no complaints voiced. Side effects with management reviewed understanding verbalized. Port site clean and dry with no bruising or swelling noted at site. Good blood return noted before and after administration of chemotherapy. Band aid applied. Patient left in satisfactory condition with VSS and no s/s of distress noted. 

## 2022-04-12 ENCOUNTER — Inpatient Hospital Stay: Payer: Medicare Other

## 2022-04-12 VITALS — BP 126/84 | HR 62 | Temp 97.8°F | Resp 19

## 2022-04-12 DIAGNOSIS — C9200 Acute myeloblastic leukemia, not having achieved remission: Secondary | ICD-10-CM

## 2022-04-12 DIAGNOSIS — D46Z Other myelodysplastic syndromes: Secondary | ICD-10-CM

## 2022-04-12 DIAGNOSIS — Z5111 Encounter for antineoplastic chemotherapy: Secondary | ICD-10-CM | POA: Diagnosis not present

## 2022-04-12 DIAGNOSIS — Z95828 Presence of other vascular implants and grafts: Secondary | ICD-10-CM

## 2022-04-12 MED ORDER — SODIUM CHLORIDE 0.9 % IV SOLN: Freq: Once | INTRAVENOUS | Status: AC

## 2022-04-12 MED ORDER — HEPARIN SOD (PORK) LOCK FLUSH 100 UNIT/ML IV SOLN
500.0000 [IU] | Freq: Once | INTRAVENOUS | Status: AC | PRN
  Administered 2022-04-12: 500 [IU]

## 2022-04-12 MED ORDER — SODIUM CHLORIDE 0.9% FLUSH
10.0000 mL | INTRAVENOUS | Status: DC | PRN
  Administered 2022-04-12: 10 mL

## 2022-04-12 MED ORDER — SODIUM CHLORIDE 0.9 % IV SOLN
15.0000 mg/m2 | Freq: Once | INTRAVENOUS | Status: AC
  Administered 2022-04-12: 35 mg via INTRAVENOUS
  Filled 2022-04-12: qty 7

## 2022-04-12 MED ORDER — ONDANSETRON HCL 4 MG/2ML IJ SOLN
4.0000 mg | Freq: Once | INTRAMUSCULAR | Status: AC
  Administered 2022-04-12: 4 mg via INTRAVENOUS
  Filled 2022-04-12: qty 2

## 2022-04-12 NOTE — Patient Instructions (Signed)
Richard Wallace  Discharge Instructions: Thank you for choosing [facility] to provide your oncology and hematology care.  If you have a lab appointment with the [facility], please come in thru the Main Entrance and check in at the main information desk.  Wear comfortable clothing and clothing appropriate for easy access to any Portacath or PICC line.   We strive to give you quality time with your provider. You may need to reschedule your appointment if you arrive late (15 or more minutes).  Arriving late affects you and other patients whose appointments are after yours.  Also, if you miss three or more appointments without notifying the office, you may be dismissed from the clinic at the provider's discretion.      For prescription refill requests, have your pharmacy contact our office and allow 72 hours for refills to be completed.    Today you received the following chemotherapy and/or immunotherapy agents Decitabine, return as scheduled.   To help prevent nausea and vomiting after your treatment, we encourage you to take your nausea medication as directed.  BELOW ARE SYMPTOMS THAT SHOULD BE REPORTED IMMEDIATELY: *FEVER GREATER THAN 100.4 F (38 C) OR HIGHER *CHILLS OR SWEATING *NAUSEA AND VOMITING THAT IS NOT CONTROLLED WITH YOUR NAUSEA MEDICATION *UNUSUAL SHORTNESS OF BREATH *UNUSUAL BRUISING OR BLEEDING *URINARY PROBLEMS (pain or burning when urinating, or frequent urination) *BOWEL PROBLEMS (unusual diarrhea, constipation, pain near the anus) TENDERNESS IN MOUTH AND THROAT WITH OR WITHOUT PRESENCE OF ULCERS (sore throat, sores in mouth, or a toothache) UNUSUAL RASH, SWELLING OR PAIN  UNUSUAL VAGINAL DISCHARGE OR ITCHING   Items with * indicate a potential emergency and should be followed up as soon as possible or go to the Emergency Department if any problems should occur.  Please show the CHEMOTHERAPY ALERT CARD or IMMUNOTHERAPY ALERT CARD at  check-in to the Emergency Department and triage nurse.  Should you have questions after your visit or need to cancel or reschedule your appointment, please contact [facility] [phone]  and follow the prompts.  Office hours are 8:00 a.m. to 4:30 p.m. Monday - Friday. Please note that voicemails left after 4:00 p.m. may not be returned until the following business day.  We are closed weekends and major holidays. You have access to a nurse at all times for urgent questions. Please call the main number to the clinic [phone] and follow the prompts.  For any non-urgent questions, you may also contact your provider using MyChart. We now offer e-Visits for anyone 35 and older to request care online for non-urgent symptoms. For details visit [email or web address].   Also download the MyChart app! Go to the app store, search "MyChart", open the app, select [facility], and log in with your MyChart username and password.  Masks are optional in the cancer centers. If you would like for your care team to wear a mask while they are taking care of you, please let them know. You may have one support person who is at least 77 years old accompany you for your appointments.

## 2022-04-12 NOTE — Progress Notes (Signed)
Patient tolerated chemotherapy with no complaints voiced. Side effects with management reviewed understanding verbalized. Port site clean and dry with no bruising or swelling noted at site. Good blood return noted before and after administration of chemotherapy. Band aid applied. Patient left in satisfactory condition with VSS and no s/s of distress noted. 

## 2022-04-13 ENCOUNTER — Inpatient Hospital Stay: Payer: Medicare Other

## 2022-04-13 VITALS — BP 146/86 | HR 61 | Temp 97.8°F | Resp 18

## 2022-04-13 DIAGNOSIS — Z95828 Presence of other vascular implants and grafts: Secondary | ICD-10-CM

## 2022-04-13 DIAGNOSIS — D46Z Other myelodysplastic syndromes: Secondary | ICD-10-CM

## 2022-04-13 DIAGNOSIS — C9200 Acute myeloblastic leukemia, not having achieved remission: Secondary | ICD-10-CM

## 2022-04-13 DIAGNOSIS — Z5111 Encounter for antineoplastic chemotherapy: Secondary | ICD-10-CM | POA: Diagnosis not present

## 2022-04-13 MED ORDER — SODIUM CHLORIDE 0.9% FLUSH
10.0000 mL | INTRAVENOUS | Status: DC | PRN
  Administered 2022-04-13: 10 mL

## 2022-04-13 MED ORDER — SODIUM CHLORIDE 0.9 % IV SOLN: Freq: Once | INTRAVENOUS | Status: AC

## 2022-04-13 MED ORDER — HEPARIN SOD (PORK) LOCK FLUSH 100 UNIT/ML IV SOLN
500.0000 [IU] | Freq: Once | INTRAVENOUS | Status: AC | PRN
  Administered 2022-04-13: 500 [IU]

## 2022-04-13 MED ORDER — ONDANSETRON HCL 4 MG/2ML IJ SOLN
4.0000 mg | Freq: Once | INTRAMUSCULAR | Status: AC
  Administered 2022-04-13: 4 mg via INTRAVENOUS
  Filled 2022-04-13: qty 2

## 2022-04-13 MED ORDER — SODIUM CHLORIDE 0.9 % IV SOLN
15.0000 mg/m2 | Freq: Once | INTRAVENOUS | Status: AC
  Administered 2022-04-13: 35 mg via INTRAVENOUS
  Filled 2022-04-13: qty 7

## 2022-04-13 NOTE — Patient Instructions (Signed)
[facility]  Discharge Instructions: Thank you for choosing [facility] to provide your oncology and hematology care.  If you have a lab appointment with the [facility], please come in thru the Main Entrance and check in at the main information desk.  Wear comfortable clothing and clothing appropriate for easy access to any Portacath or PICC line.   We strive to give you quality time with your provider. You may need to reschedule your appointment if you arrive late (15 or more minutes).  Arriving late affects you and other patients whose appointments are after yours.  Also, if you miss three or more appointments without notifying the office, you may be dismissed from the clinic at the provider's discretion.      For prescription refill requests, have your pharmacy contact our office and allow 72 hours for refills to be completed.    Today you received the following chemotherapy and/or immunotherapy agents Decitabine, return as scheduled.   To help prevent nausea and vomiting after your treatment, we encourage you to take your nausea medication as directed.  BELOW ARE SYMPTOMS THAT SHOULD BE REPORTED IMMEDIATELY: *FEVER GREATER THAN 100.4 F (38 C) OR HIGHER *CHILLS OR SWEATING *NAUSEA AND VOMITING THAT IS NOT CONTROLLED WITH YOUR NAUSEA MEDICATION *UNUSUAL SHORTNESS OF BREATH *UNUSUAL BRUISING OR BLEEDING *URINARY PROBLEMS (pain or burning when urinating, or frequent urination) *BOWEL PROBLEMS (unusual diarrhea, constipation, pain near the anus) TENDERNESS IN MOUTH AND THROAT WITH OR WITHOUT PRESENCE OF ULCERS (sore throat, sores in mouth, or a toothache) UNUSUAL RASH, SWELLING OR PAIN  UNUSUAL VAGINAL DISCHARGE OR ITCHING   Items with * indicate a potential emergency and should be followed up as soon as possible or go to the Emergency Department if any problems should occur.  Please show the CHEMOTHERAPY ALERT CARD or IMMUNOTHERAPY ALERT CARD at  check-in to the Emergency Department and triage nurse.  Should you have questions after your visit or need to cancel or reschedule your appointment, please contact [facility] [phone]  and follow the prompts.  Office hours are 8:00 a.m. to 4:30 p.m. Monday - Friday. Please note that voicemails left after 4:00 p.m. may not be returned until the following business day.  We are closed weekends and major holidays. You have access to a nurse at all times for urgent questions. Please call the main number to the clinic [phone] and follow the prompts.  For any non-urgent questions, you may also contact your provider using MyChart. We now offer e-Visits for anyone 6 and older to request care online for non-urgent symptoms. For details visit [email or web address].   Also download the MyChart app! Go to the app store, search "MyChart", open the app, select [facility], and log in with your MyChart username and password.  Masks are optional in the cancer centers. If you would like for your care team to wear a mask while they are taking care of you, please let them know. You may have one support person who is at least 77 years old accompany you for your appointments.

## 2022-04-13 NOTE — Progress Notes (Signed)
Patient tolerated chemotherapy with no complaints voiced. Side effects with management reviewed understanding verbalized. Port site clean and dry with no bruising or swelling noted at site. Good blood return noted before and after administration of chemotherapy. Band aid applied. Patient left in satisfactory condition with VSS and no s/s of distress noted. 

## 2022-04-14 ENCOUNTER — Inpatient Hospital Stay: Payer: Medicare Other

## 2022-04-14 VITALS — BP 140/88 | HR 64 | Temp 97.6°F | Resp 20

## 2022-04-14 DIAGNOSIS — C9200 Acute myeloblastic leukemia, not having achieved remission: Secondary | ICD-10-CM

## 2022-04-14 DIAGNOSIS — Z5111 Encounter for antineoplastic chemotherapy: Secondary | ICD-10-CM | POA: Diagnosis not present

## 2022-04-14 DIAGNOSIS — D46Z Other myelodysplastic syndromes: Secondary | ICD-10-CM

## 2022-04-14 DIAGNOSIS — Z95828 Presence of other vascular implants and grafts: Secondary | ICD-10-CM

## 2022-04-14 MED ORDER — HEPARIN SOD (PORK) LOCK FLUSH 100 UNIT/ML IV SOLN
500.0000 [IU] | Freq: Once | INTRAVENOUS | Status: AC | PRN
  Administered 2022-04-14: 500 [IU]

## 2022-04-14 MED ORDER — SODIUM CHLORIDE 0.9 % IV SOLN
15.0000 mg/m2 | Freq: Once | INTRAVENOUS | Status: AC
  Administered 2022-04-14: 35 mg via INTRAVENOUS
  Filled 2022-04-14: qty 7

## 2022-04-14 MED ORDER — SODIUM CHLORIDE 0.9 % IV SOLN: Freq: Once | INTRAVENOUS | Status: AC

## 2022-04-14 MED ORDER — ONDANSETRON HCL 4 MG/2ML IJ SOLN
4.0000 mg | Freq: Once | INTRAMUSCULAR | Status: AC
  Administered 2022-04-14: 4 mg via INTRAVENOUS
  Filled 2022-04-14: qty 2

## 2022-04-14 MED ORDER — SODIUM CHLORIDE 0.9% FLUSH
10.0000 mL | INTRAVENOUS | Status: DC | PRN
  Administered 2022-04-14: 10 mL

## 2022-04-14 NOTE — Patient Instructions (Signed)
Richard Wallace  Discharge Instructions: Thank you for choosing [facility] to provide your oncology and hematology care.  If you have a lab appointment with the [facility], please come in thru the Main Entrance and check in at the main information desk.  Wear comfortable clothing and clothing appropriate for easy access to any Portacath or PICC line.   We strive to give you quality time with your provider. You may need to reschedule your appointment if you arrive late (15 or more minutes).  Arriving late affects you and other patients whose appointments are after yours.  Also, if you miss three or more appointments without notifying the office, you may be dismissed from the clinic at the provider's discretion.      For prescription refill requests, have your pharmacy contact our office and allow 72 hours for refills to be completed.    Today you received the following chemotherapy and/or immunotherapy agents D5 Decitabine   To help prevent nausea and vomiting after your treatment, we encourage you to take your nausea medication as directed.  Decitabine Injection What is this medication? DECITABINE (dee SYE ta been) treats blood and bone marrow cancers. It works by slowing down the growth of cancer cells. This medicine may be used for other purposes; ask your health care provider or pharmacist if you have questions. COMMON BRAND NAME(S): Dacogen What should I tell my care team before I take this medication? They need to know if you have any of these conditions: Infection Kidney disease Liver disease An unusual or allergic reaction to decitabine, other medications, foods, dyes, or preservatives Pregnant or trying to get pregnant Breast-feeding How should I use this medication? This medication is infused into a vein. It is given by your care team in a hospital or clinic setting. Talk to your care team about the use of this medication in children. Special  care may be needed. Overdosage: If you think you have taken too much of this medicine contact a poison control center or emergency room at once. NOTE: This medicine is only for you. Do not share this medicine with others. What if I miss a dose? Keep appointments for follow-up doses. It is important not to miss your dose. Call your care team if you are unable to keep an appointment. What may interact with this medication? Interactions are not expected. This list may not describe all possible interactions. Give your health care provider a list of all the medicines, herbs, non-prescription drugs, or dietary supplements you use. Also tell them if you smoke, drink alcohol, or use illegal drugs. Some items may interact with your medicine. What should I watch for while using this medication? Visit your care team for regular checks on your progress. You may need blood work while taking this medication. This medication may make you feel generally unwell. This is not uncommon as chemotherapy can affect healthy cells as well as cancer cells. Report any side effects. Continue your course of treatment even though you feel ill unless your care team tells you to stop. This medication may increase your risk of getting an infection. Call your care team for advice if you get a fever, chills, sore throat, or other symptoms of a cold or flu. Do not treat yourself. Try to avoid being around people who are sick. Be careful brushing or flossing your teeth or using a toothpick because you may get an infection or bleed more easily. If you have any dental work  done, tell your dentist you are receiving this medication. Call your care team if you are around anyone with measles, chickenpox, or if you develop sores or blisters that do not heal properly. Avoid taking medications that contain aspirin, acetaminophen, ibuprofen, naproxen, or ketoprofen unless instructed by your care team. These medications may hide a fever. Talk to  your care team if you or your partner wish to become pregnant or think either of you might be pregnant. This medication can cause serious birth defects if taken during pregnancy or for 6 months after the last dose. A negative pregnancy test is required before starting this medication. A reliable form of contraception is recommended while taking this medication and for 6 months after the last dose. Talk to your care team about reliable forms of contraception. Do not father a child while taking this medication or for 3 months after the last dose. Use a condom while having sex during this time period. Do not breast-feed while taking this medication and for 2 weeks after the last dose. This medication may cause infertility. Talk to your care team if you are concerned about your fertility. What side effects may I notice from receiving this medication? Side effects that you should report to your care team as soon as possible: Allergic reactions--skin rash, itching, hives, swelling of the face, lips, tongue, or throat Infection--fever, chills, cough, sore throat, wounds that don't heal, pain or trouble when passing urine, general feeling of discomfort or being unwell Low red blood cell level--unusual weakness or fatigue, dizziness, headache, trouble breathing Unusual bruising or bleeding Side effects that usually do not require medical attention (report to your care team if they continue or are bothersome): Constipation Diarrhea Fatigue Nausea Pain, redness, or swelling with sores inside the mouth or throat Stomach pain This list may not describe all possible side effects. Call your doctor for medical advice about side effects. You may report side effects to FDA at 1-800-FDA-1088. Where should I keep my medication? This medication is given in a hospital or clinic. It will not be stored at home. NOTE: This sheet is a summary. It may not cover all possible information. If you have questions about this  medicine, talk to your doctor, pharmacist, or health care provider.  2023 Elsevier/Gold Standard (2021-08-01 00:00:00)   BELOW ARE SYMPTOMS THAT SHOULD BE REPORTED IMMEDIATELY: *FEVER GREATER THAN 100.4 F (38 C) OR HIGHER *CHILLS OR SWEATING *NAUSEA AND VOMITING THAT IS NOT CONTROLLED WITH YOUR NAUSEA MEDICATION *UNUSUAL SHORTNESS OF BREATH *UNUSUAL BRUISING OR BLEEDING *URINARY PROBLEMS (pain or burning when urinating, or frequent urination) *BOWEL PROBLEMS (unusual diarrhea, constipation, pain near the anus) TENDERNESS IN MOUTH AND THROAT WITH OR WITHOUT PRESENCE OF ULCERS (sore throat, sores in mouth, or a toothache) UNUSUAL RASH, SWELLING OR PAIN  UNUSUAL VAGINAL DISCHARGE OR ITCHING   Items with * indicate a potential emergency and should be followed up as soon as possible or go to the Emergency Department if any problems should occur.  Please show the CHEMOTHERAPY ALERT CARD or IMMUNOTHERAPY ALERT CARD at check-in to the Emergency Department and triage nurse.  Should you have questions after your visit or need to cancel or reschedule your appointment, please contact [facility] [phone]  and follow the prompts.  Office hours are 8:00 a.m. to 4:30 p.m. Monday - Friday. Please note that voicemails left after 4:00 p.m. may not be returned until the following business day.  We are closed weekends and major holidays. You have  access to a nurse at all times for urgent questions. Please call the main number to the clinic [phone] and follow the prompts.  For any non-urgent questions, you may also contact your provider using MyChart. We now offer e-Visits for anyone 62 and older to request care online for non-urgent symptoms. For details visit [email or web address].   Also download the MyChart app! Go to the app store, search "MyChart", open the app, select , and log in with your MyChart username and password.  Masks are optional in the cancer  centers. If you would like for your care team to wear a mask while they are taking care of you, please let them know. You may have one support person who is at least 78 years old accompany you for your appointments.

## 2022-04-14 NOTE — Progress Notes (Signed)
Pt presents today for D5 Decitabine per provider's order. Vital signs stable and pt voiced no new complaints at this time.  D5 Decitabine given today per MD orders. Tolerated infusion without adverse affects. Vital signs stable. No complaints at this time. Discharged from clinic via motorized scooter  in stable condition. Alert and oriented x 3. F/U with [facility] as scheduled.

## 2022-04-25 ENCOUNTER — Emergency Department (HOSPITAL_COMMUNITY): Payer: Medicare Other

## 2022-04-25 ENCOUNTER — Encounter (HOSPITAL_COMMUNITY): Payer: Self-pay

## 2022-04-25 ENCOUNTER — Other Ambulatory Visit: Payer: Self-pay

## 2022-04-25 ENCOUNTER — Emergency Department (HOSPITAL_COMMUNITY)
Admission: EM | Admit: 2022-04-25 | Discharge: 2022-04-25 | Disposition: A | Discharge status: Home / Self Care | Payer: Medicare Other | Attending: Emergency Medicine | Admitting: Emergency Medicine

## 2022-04-25 DIAGNOSIS — R9389 Abnormal findings on diagnostic imaging of other specified body structures: Secondary | ICD-10-CM | POA: Insufficient documentation

## 2022-04-25 DIAGNOSIS — I129 Hypertensive chronic kidney disease with stage 1 through stage 4 chronic kidney disease, or unspecified chronic kidney disease: Secondary | ICD-10-CM | POA: Diagnosis not present

## 2022-04-25 DIAGNOSIS — R300 Dysuria: Secondary | ICD-10-CM | POA: Diagnosis present

## 2022-04-25 DIAGNOSIS — Z85828 Personal history of other malignant neoplasm of skin: Secondary | ICD-10-CM | POA: Diagnosis not present

## 2022-04-25 DIAGNOSIS — N183 Chronic kidney disease, stage 3 unspecified: Secondary | ICD-10-CM | POA: Insufficient documentation

## 2022-04-25 DIAGNOSIS — Z79899 Other long term (current) drug therapy: Secondary | ICD-10-CM | POA: Insufficient documentation

## 2022-04-25 DIAGNOSIS — N320 Bladder-neck obstruction: Secondary | ICD-10-CM | POA: Diagnosis not present

## 2022-04-25 DIAGNOSIS — Z87891 Personal history of nicotine dependence: Secondary | ICD-10-CM | POA: Diagnosis not present

## 2022-04-25 DIAGNOSIS — R3916 Straining to void: Secondary | ICD-10-CM | POA: Insufficient documentation

## 2022-04-25 LAB — URINALYSIS, ROUTINE W REFLEX MICROSCOPIC
Bacteria, UA: NONE SEEN
Bilirubin Urine: NEGATIVE
Glucose, UA: NEGATIVE mg/dL
Hgb urine dipstick: NEGATIVE
Ketones, ur: NEGATIVE mg/dL
Nitrite: NEGATIVE
Protein, ur: NEGATIVE mg/dL
Specific Gravity, Urine: 1.027 (ref 1.005–1.030)
pH: 5 (ref 5.0–8.0)

## 2022-04-25 LAB — CBC WITH DIFFERENTIAL/PLATELET
Abs Immature Granulocytes: 0.03 10*3/uL (ref 0.00–0.07)
Basophils Absolute: 0 10*3/uL (ref 0.0–0.1)
Basophils Relative: 0 %
Eosinophils Absolute: 0 10*3/uL (ref 0.0–0.5)
Eosinophils Relative: 1 %
HCT: 44.7 % (ref 39.0–52.0)
Hemoglobin: 14.6 g/dL (ref 13.0–17.0)
Immature Granulocytes: 1 %
Lymphocytes Relative: 15 %
Lymphs Abs: 0.9 10*3/uL (ref 0.7–4.0)
MCH: 30.7 pg (ref 26.0–34.0)
MCHC: 32.7 g/dL (ref 30.0–36.0)
MCV: 93.9 fL (ref 80.0–100.0)
Monocytes Absolute: 0.6 10*3/uL (ref 0.1–1.0)
Monocytes Relative: 10 %
Neutro Abs: 4.2 10*3/uL (ref 1.7–7.7)
Neutrophils Relative %: 73 %
Platelets: 95 10*3/uL — ABNORMAL LOW (ref 150–400)
RBC: 4.76 MIL/uL (ref 4.22–5.81)
RDW: 14.2 % (ref 11.5–15.5)
WBC: 5.7 10*3/uL (ref 4.0–10.5)
nRBC: 0 % (ref 0.0–0.2)

## 2022-04-25 LAB — COMPREHENSIVE METABOLIC PANEL
ALT: 33 U/L (ref 0–44)
AST: 22 U/L (ref 15–41)
Albumin: 3.9 g/dL (ref 3.5–5.0)
Alkaline Phosphatase: 60 U/L (ref 38–126)
Anion gap: 7 (ref 5–15)
BUN: 19 mg/dL (ref 8–23)
CO2: 26 mmol/L (ref 22–32)
Calcium: 9.2 mg/dL (ref 8.9–10.3)
Chloride: 109 mmol/L (ref 98–111)
Creatinine, Ser: 0.98 mg/dL (ref 0.61–1.24)
GFR, Estimated: >60 mL/min (ref >60)
Glucose, Bld: 102 mg/dL — ABNORMAL HIGH (ref 70–99)
Potassium: 4.1 mmol/L (ref 3.5–5.1)
Sodium: 142 mmol/L (ref 135–145)
Total Bilirubin: 1 mg/dL (ref 0.3–1.2)
Total Protein: 6.2 g/dL — ABNORMAL LOW (ref 6.5–8.1)

## 2022-04-25 LAB — LIPASE, BLOOD: Lipase: 57 U/L — ABNORMAL HIGH (ref 11–51)

## 2022-04-25 MED ORDER — SODIUM CHLORIDE 0.9 % IV BOLUS
500.0000 mL | Freq: Once | INTRAVENOUS | Status: AC
  Administered 2022-04-25: 500 mL via INTRAVENOUS

## 2022-04-25 MED ORDER — TAMSULOSIN HCL 0.4 MG PO CAPS
0.4000 mg | ORAL_CAPSULE | Freq: Once | ORAL | Status: AC
  Administered 2022-04-25: 0.4 mg via ORAL
  Filled 2022-04-25: qty 1

## 2022-04-25 MED ORDER — TAMSULOSIN HCL 0.4 MG PO CAPS: 0.8000 mg | ORAL_CAPSULE | Freq: Every day | ORAL | 0 refills | Status: AC

## 2022-04-25 NOTE — ED Triage Notes (Addendum)
Pt presents to ED with difficulty urinating and frequency. Pt was seen at [facility] with difficulty urinating, UTI ruled out and sent here for further evaluation due to pt being cancer pt. 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

## 2022-04-25 NOTE — Discharge Instructions (Addendum)
It was a pleasure caring for you today in the emergency department.  Please return to the emergency department for any worsening or worrisome symptoms.  Your cat scan is concerning for possible interstitial lung disease, please follow up with your primary care doctor in regards to this  Please follow up with urology for further evaluation, medication management  Please return to ED if unable to urinate or if you develop significant pain below your belly button/abdomen.

## 2022-04-25 NOTE — ED Provider Notes (Signed)
[facility] EMERGENCY DEPARTMENT Provider Note   CSN: [id number] Arrival date & time: 04/25/22  1002     History  Chief Complaint  Patient presents with   Dysuria    Richard Wallace is a 76 y.o. male.  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 Dr Worthy Keeler (last chemo 2 wks ago, Decitabine q6wks and venetoclax 2wks after decitabine), renal dysfunction who presents to the ED with complaint of diff w/ urination Pt reports gradually worsening diff with urination, worse over last few days Will get up to urinate multiple times a night, sensation of incomplete voiding, no sig dysuria or hematuria No flank pain or suprapubic pain, no n/v Tolerating po w/o difficulty On flomax qd No penile or testicle discomfort or swelling No trauma  Seen at [facility] yesterday, UA with some bacteria but not enough to treat per spouse, advised to f/u at ED for further eval     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   Cancer ([place]) 01-28-2014   skin cancer   CKD (chronic kidney disease), stage III (HCC)    GERD (gastroesophageal reflux disease)    Heart murmur    NOTED DURING PHYSICAL WHEN HE WAS ENLISTING IN MILITARY , DIDNT KNOW UNTIL THAT TIME AND REPORTS , "THATS THE LAST I HEARD ABOUT IT "    History of hypertension    no longer issue   History of kidney stones    History of malignant melanoma of skin    excision top of scalp 2015-- no recurrence   History of urinary retention    post op lumbar fusion surgery 04/ 2016   Hypertension    Kidney dysfunction    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, MONITORED BY [facility] DR Annie Main DAHLSTEDT    Left ureteral calculus    Seasonal allergies    Wears glasses    Wears glasses    Wears partial dentures    upper and lower    Past Surgical History:  Procedure Laterality Date   ANKLE FUSION Right 2007   CARPAL TUNNEL RELEASE Left  12/28/2009   w/ pulley release left long finger   CARPAL TUNNEL RELEASE Right 07/22/2013   Procedure: RIGHT CARPAL TUNNEL RELEASE;  Surgeon: Cammie Sickle., MD;  Location: [place];  Service: Orthopedics;  Laterality: Right;   COLONOSCOPY     CYSTO/  LEFT RETROGRADE PYELOGRAM  11/21/2010   CYSTOSCOPY WITH STENT PLACEMENT Left 03/09/2016   Procedure: CYSTOSCOPY WITH STENT PLACEMENT;  Surgeon: Franchot Gallo, MD;  Location: [facility];  Service: Urology;  Laterality: Left;   CYSTOSCOPY/RETROGRADE/URETEROSCOPY/STONE EXTRACTION WITH BASKET Left 03/09/2016   Procedure: CYSTOSCOPY/RETROGRADE/URETEROSCOPY/STONE EXTRACTION WITH BASKET;  Surgeon: Franchot Gallo, MD;  Location: [facility];  Service: Urology;  Laterality: Left;   LEFT URETEROSCOPIC LASER LITHOTRIPSY STONE EXTRACTION/ STENT PLACEMENT  05/23/2010   MOHS SURGERY     TOP OF THE HEAD    ORIF ANKLE FRACTURE Right 1978   PORT-A-CATH REMOVAL Right 02/14/2019   Procedure: MINOR REMOVAL PORT-A-CATH;  Surgeon: Aviva Signs, MD;  Location: [facility];  Service: General;  Laterality: Right;   PORTACATH PLACEMENT Right 08/19/2018   Procedure: INSERTION PORT-A-CATH (attached catheter in right subclavian);  Surgeon: Aviva Signs, MD;  Location: [facility];  Service: General;  Laterality: Right;   PORTACATH PLACEMENT Left 05/16/2019   Procedure: INSERTION PORT-A-CATH (attached  catheter in left subclavian);  Surgeon: Aviva Signs, MD;  Location: [facility];  Service: General;  Laterality: Left;   POSTERIOR LUMBAR FUSION  08/21/2014   laminectomy and decompression L2 -- L5   RIGHT LOWER LEG SURGERY  X3  1975 to 1976   including ORIF   TONSILLECTOMY AND ADENOIDECTOMY  7619   UMBILICAL HERNIA REPAIR  2009 approx     The history is provided by the patient and the spouse. No language interpreter was used.  Dysuria Presenting symptoms: dysuria   Associated symptoms: urinary frequency   Associated symptoms: no  abdominal pain, no fever, no hematuria, no nausea and no vomiting        Home Medications Prior to Admission medications   Medication Sig Start Date End Date Taking? Authorizing Provider  tamsulosin (FLOMAX) 0.4 MG CAPS capsule Take 2 capsules (0.8 mg total) by mouth daily after breakfast for 14 days. 04/25/22 05/09/22 Yes Jeanell Sparrow, DO  ACETAMINOPHEN EXTRA STRENGTH 500 MG capsule  05/31/21   [provider]  allopurinol (ZYLOPRIM) 300 MG tablet Take 300 mg by mouth daily. 07/20/21   [provider]  ALPRAZolam Duanne Moron) 0.25 MG tablet Take 1 tablet by mouth twice daily as needed for anxiety 12/27/20   [name], MD  amLODipine (NORVASC) 2.5 MG tablet Take 1 tablet by mouth once daily 01/17/22   [name], MD  cetirizine (ZYRTEC) 10 MG tablet Take 10 mg by mouth daily. IN THE MORNING    [provider]  diclofenac Sodium (VOLTAREN) 1 % GEL Apply three times daily to knees as needed for pain 12/31/20   [name], MD  docusate sodium (COLACE) 100 MG capsule Take 100 mg by mouth at bedtime.     [provider]  donepezil (ARICEPT) 10 MG tablet TAKE 1 TABLET BY MOUTH AT BEDTIME 01/24/22   [name], MD  furosemide (LASIX) 20 MG tablet Take 1 tablet (20 mg total) by mouth daily as needed. 11/28/21   [name], MD  ipratropium (ATROVENT) 0.03 % nasal spray Place 2 sprays into both nostrils 3 (three) times daily. 10/18/21   [name], MD  lansoprazole (PREVACID) 15 MG capsule Take 15 mg by mouth daily.  05/28/17   [provider]  lidocaine-prilocaine (EMLA) cream Apply 1 application topically See admin instructions. ONE HOUR PRIOR TO CHEMOTHERAPY APPOINTMENT 03/07/21   [name], MD  magnesium oxide (MAG-OX) 400 (240 Mg) MG tablet TAKE 1 TABLET BY MOUTH THREE TIMES DAILY 09/29/21   [name], MD  oxyCODONE (OXY IR/ROXICODONE) 5 MG immediate release tablet Take 1-2 tablets (5-10  mg total) by mouth every 6 (six) hours as needed. 03/27/22   [name], MD  SF 5000 PLUS 1.1 % CREA dental cream SMARTSIG:Sparingly By Mouth Every Night 10/11/20   [provider]  tamsulosin (FLOMAX) 0.4 MG CAPS capsule TAKE 1 CAPSULE BY MOUTH IN THE EVENING 09/29/21   [name], MD  venetoclax (VENCLEXTA) 100 MG tablet TAKE 2 TABLETS (200 MG) BY MOUTH DAILY 01/06/22 01/06/23  [name], MD      Allergies    Patient has no known allergies.    Review of Systems   Review of Systems  Constitutional:  Negative for chills and fever.  HENT:  Negative for facial swelling and trouble swallowing.   Eyes:  Negative for photophobia and visual disturbance.  Respiratory:  Negative for cough and shortness of breath.   Cardiovascular:  Negative for chest pain and palpitations.  Gastrointestinal:  Negative for abdominal pain, nausea and vomiting.  Endocrine: Negative for polydipsia and polyuria.  Genitourinary:  Positive for difficulty urinating, dysuria, frequency and urgency. Negative for hematuria.  Musculoskeletal:  Negative for gait problem and joint swelling.  Skin:  Negative for pallor and rash.  Neurological:  Negative for syncope and headaches.  Psychiatric/Behavioral:  Negative for agitation and confusion.     Physical Exam Updated Vital Signs BP (!) 144/92   Pulse 71   Temp 97.8 F (36.6 C) (Oral)   Resp 15   Ht 5' 9.5" (1.765 m)   Wt 100.7 kg   SpO2 96%   BMI 32.31 kg/m  Physical Exam Vitals and nursing note reviewed.  Constitutional:      General: He is not in acute distress.    Appearance: Normal appearance. He is well-developed. He is not ill-appearing, toxic-appearing or diaphoretic.  HENT:     Head: Normocephalic and atraumatic.     Right Ear: External ear normal.     Left Ear: External ear normal.     Mouth/Throat:     Mouth: Mucous membranes are moist.  Eyes:     General: No scleral icterus. Cardiovascular:     Rate and  Rhythm: Normal rate and regular rhythm.     Pulses: Normal pulses.     Heart sounds: Normal heart sounds.  Pulmonary:     Effort: Pulmonary effort is normal. No respiratory distress.     Breath sounds: Normal breath sounds.  Abdominal:     General: Abdomen is flat.     Palpations: Abdomen is soft.     Tenderness: There is no abdominal tenderness. There is no guarding or rebound.  Musculoskeletal:        General: Normal range of motion.     Cervical back: Normal range of motion.     Right lower leg: No edema.     Left lower leg: No edema.  Skin:    General: Skin is warm and dry.     Capillary Refill: Capillary refill takes less than 2 seconds.  Neurological:     Mental Status: He is alert and oriented to person, place, and time.     GCS: GCS eye subscore is 4. GCS verbal subscore is 5. GCS motor subscore is 6.  Psychiatric:        Mood and Affect: Mood normal.        Behavior: Behavior normal.     ED Results / Procedures / Treatments   Labs (all labs ordered are listed, but only abnormal results are displayed) Labs Reviewed  CBC WITH DIFFERENTIAL/PLATELET - Abnormal; Notable for the following components:      Result Value   Platelets 95 (*)    All other components within normal limits  COMPREHENSIVE METABOLIC PANEL - Abnormal; Notable for the following components:   Glucose, Bld 102 (*)    Total Protein 6.2 (*)    All other components within normal limits  LIPASE, BLOOD - Abnormal; Notable for the following components:   Lipase 57 (*)    All other components within normal limits  URINALYSIS, ROUTINE W REFLEX MICROSCOPIC - Abnormal; Notable for the following components:   Leukocytes,Ua SMALL (*)    All other components within normal limits    EKG None  Radiology CT Renal Stone Study  Result Date: 04/25/2022 CLINICAL DATA:  Difficulty urinating. 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 EXAM: CT ABDOMEN AND PELVIS WITHOUT CONTRAST TECHNIQUE: Multidetector CT imaging of the  abdomen and pelvis  was performed following the standard protocol without IV contrast. RADIATION DOSE REDUCTION: This exam was performed according to the departmental dose-optimization program which includes automated exposure control, adjustment of the mA and/or kV according to patient size and/or use of iterative reconstruction technique. COMPARISON:  Abdominal ultrasound dated January 23, 2022. CT abdomen pelvis dated February 12, 2019. FINDINGS: Lower chest: No acute abnormality. New mild bibasilar bronchiectasis and increased subpleural reticulation. Hepatobiliary: Unchanged mildly decreased liver density without focal abnormality. The gallbladder is unremarkable. No biliary dilatation. Pancreas: Unremarkable. No pancreatic ductal dilatation or surrounding inflammatory changes. Spleen: Normal in size without focal abnormality. Adrenals/Urinary Tract: The adrenal glands are unremarkable. Unchanged left renal cortical thinning and atrophy. Unchanged 7 mm calculus in the lower pole of the right kidney. New punctate calculus in the midpole of the right kidney. Bilateral renal simple cysts of slightly increased in size. No follow-up imaging is recommended. No hydronephrosis. Unchanged mild asymmetric left bladder wall thickening (series 2, image 83). Stomach/Bowel: The stomach is within normal limits. Unchanged duodenal diverticulum. No bowel wall thickening, distention, or surrounding inflammatory changes. Normal appendix. Moderate left colonic diverticulosis. Vascular/Lymphatic: Aortic atherosclerosis. No enlarged abdominal or pelvic lymph nodes. Reproductive: Unchanged mild prostatomegaly. Other: No abdominal wall hernia or abnormality. No abdominopelvic ascites. No pneumoperitoneum. Musculoskeletal: No acute or significant osseous findings. Prior lumbar fusion. IMPRESSION: 1. No acute intra-abdominal process. 2. Chronic and unchanged mild asymmetric left bladder wall thickening, potentially related to chronic  bladder outlet obstruction. 3. Unchanged 7 mm calculus in the lower pole of the right kidney. New punctate calculus in the midpole of the right kidney. No hydronephrosis. 4. New mild bibasilar bronchiectasis and increased subpleural reticulation, concerning for interstitial lung disease. 5.  Aortic Atherosclerosis (ICD10-I70.0). Electronically Signed   By: Titus Dubin M.D.   On: 04/25/2022 13:17    Procedures Procedures    Medications Ordered in ED Medications  tamsulosin (FLOMAX) capsule 0.4 mg (0.4 mg Oral Given 04/25/22 1307)  sodium chloride 0.9 % bolus 500 mL (500 mLs Intravenous New Bag/Given 04/25/22 1311)    ED Course/ Medical Decision Making/ A&P                           Medical Decision Making Amount and/or Complexity of Data Reviewed Labs: ordered. Radiology: ordered.  Risk Prescription drug management.   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 The complaint involves an extensive differential diagnosis and also carries with it a high risk of complications and morbidity.    The differential diagnosis includes but not limited to bph, uti, nephrolithiasis, pyelonephritis, bladder obstruction, medication effect, etc. Serious etiologies were considered.   The initial plan is to screening labs, ua, post void   Additional history obtained: Additional history obtained from family Records reviewed Primary Care Documents and prior labs/imaging/home meds   Independent labs interpretation:  The following labs were independently interpreted:  Ua w/o infection Cr stable No leukocytosis Lipase minimally elevated   Independent visualization of imaging: - I independently visualized the following imaging with scope of interpretation limited to determining acute life threatening conditions related to  emergency care: ct renal, which revealed chronic bladder wall thickening ?chronic bladder outlet obstruction, ?interstitial lung disease, renal calculus   Cardiac monitoring was reviewed and interpreted by myself which shows nsr  Treatment and Reassessment: Flomax  IVF >> improved, able to urinate somewhat better  Consultation: - Consulted or discussed management/test interpretation w/ external professional: na  Consideration for admission or further workup: Admission was considered   Pt with straining on urination, possibly secondary to chronic bladder outlet obstruction, ?bph. He is able to void spontaneously here, minimal urine on post void resudual (~8m) no sig suprapubic pain or distension. Will increase flomax and have him f/u with urology for further eval. No need for foley at this time. Strict return precautions were discussed  CT with possible findings c/w interstitial lung disease, advised him to f/u with pcp in regards to this. He has no pulmonary complaints and is breathing comfortably on ambient air. Hds. Former tobacco abuse   The patient improved significantly and was discharged in stable condition. Detailed discussions were had with the patient regarding current findings, and need for close f/u with PCP or on call doctor. The patient has been instructed to return immediately if the symptoms worsen in any way for re-evaluation. Patient verbalized understanding and is in agreement with current care plan. All questions answered prior to discharge.    Social Determinants of health: Social History   Tobacco Use   Smoking status: Former    Years: 20.00    Types: Cigarettes    Quit date: 07/17/1986    Years since quitting: 35.7   Smokeless tobacco: Never  Vaping Use   Vaping Use: Never used  Substance Use Topics   Alcohol use: Yes    Alcohol/week: 7.0 - 14.0 standard drinks of alcohol    Types: 7 - 14 Cans of beer per week    Comment: 1 -2 beer daily   Drug use: No             Final Clinical Impression(s) / ED Diagnoses Final diagnoses:  Bladder outlet obstruction  Straining on urination  Abnormal CT scan    Rx / DC Orders ED Discharge Orders          Ordered    tamsulosin (FLOMAX) 0.4 MG CAPS capsule  Daily after breakfast        04/25/22 1519              GJeanell Sparrow DO 04/25/22 1542

## 2022-05-05 ENCOUNTER — Other Ambulatory Visit (HOSPITAL_COMMUNITY): Payer: Self-pay | Admitting: Hematology

## 2022-05-09 ENCOUNTER — Encounter: Payer: Self-pay | Admitting: Hematology

## 2022-05-17 ENCOUNTER — Other Ambulatory Visit (HOSPITAL_COMMUNITY): Payer: Self-pay

## 2022-05-19 ENCOUNTER — Other Ambulatory Visit: Payer: Self-pay

## 2022-05-19 ENCOUNTER — Other Ambulatory Visit (HOSPITAL_COMMUNITY): Payer: Self-pay

## 2022-05-19 DIAGNOSIS — D46Z Other myelodysplastic syndromes: Secondary | ICD-10-CM

## 2022-05-22 ENCOUNTER — Inpatient Hospital Stay: Payer: Medicare Other | Admitting: Hematology

## 2022-05-22 ENCOUNTER — Telehealth: Payer: Self-pay | Admitting: *Deleted

## 2022-05-22 ENCOUNTER — Inpatient Hospital Stay: Payer: Medicare Other

## 2022-05-22 ENCOUNTER — Emergency Department (HOSPITAL_COMMUNITY)
Admission: EM | Admit: 2022-05-22 | Discharge: 2022-05-22 | Disposition: A | Discharge status: Home / Self Care | Payer: Medicare Other | Attending: Emergency Medicine | Admitting: Emergency Medicine

## 2022-05-22 ENCOUNTER — Emergency Department (HOSPITAL_COMMUNITY): Payer: Medicare Other

## 2022-05-22 ENCOUNTER — Other Ambulatory Visit: Payer: Self-pay

## 2022-05-22 DIAGNOSIS — Z20822 Contact with and (suspected) exposure to covid-19: Secondary | ICD-10-CM | POA: Diagnosis not present

## 2022-05-22 DIAGNOSIS — B974 Respiratory syncytial virus as the cause of diseases classified elsewhere: Secondary | ICD-10-CM | POA: Diagnosis not present

## 2022-05-22 DIAGNOSIS — R0981 Nasal congestion: Secondary | ICD-10-CM | POA: Diagnosis not present

## 2022-05-22 DIAGNOSIS — R059 Cough, unspecified: Secondary | ICD-10-CM | POA: Insufficient documentation

## 2022-05-22 DIAGNOSIS — B338 Other specified viral diseases: Secondary | ICD-10-CM

## 2022-05-22 LAB — RESP PANEL BY RT-PCR (RSV, FLU A&B, COVID)  RVPGX2
Influenza A by PCR: NEGATIVE
Influenza B by PCR: NEGATIVE
Resp Syncytial Virus by PCR: POSITIVE — AB
SARS Coronavirus 2 by RT PCR: NEGATIVE

## 2022-05-22 MED ORDER — GUAIFENESIN-DM 100-10 MG/5ML PO SYRP: 5.0000 mL | ORAL_SOLUTION | ORAL | 0 refills | Status: DC | PRN

## 2022-05-22 NOTE — ED Triage Notes (Signed)
Pt present with cough and runny nose, x 2 weeks, given antibiotics last week and tested negative for Covid, not feeling any better, currently being treated for Leukemia and Lung Cancer at [place].

## 2022-05-22 NOTE — Telephone Encounter (Signed)
Patient has been having symptoms of productive cough, generalized malaise. Went to PCP on Tuesday and was tested for flu and covid.  Both were negative, however symptoms have worsened.  I have advised, since he is on active treatment to go to the ER.  Both he and his wife have same symptoms.  Verbalized understanding.

## 2022-05-22 NOTE — ED Provider Notes (Signed)
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  States his wife is also had similar symptoms.  Denies shortness of breath or chest pain.  Was evaluated by urgent care last week who started him on antibiotics.  States that symptoms have not improved.  Concerned that he may have COVID and flu.       Home Medications Prior to Admission medications   Medication Sig Start Date End Date Taking? Authorizing Provider  ACETAMINOPHEN EXTRA STRENGTH 500 MG capsule  05/31/21   [provider]  allopurinol (ZYLOPRIM) 300 MG tablet Take 300 mg by mouth daily. 07/20/21   [provider]  ALPRAZolam Duanne Moron) 0.25 MG tablet Take 1 tablet by mouth twice daily as needed for anxiety 12/27/20   [name], MD  amLODipine (NORVASC) 2.5 MG tablet Take 1 tablet by mouth once daily 05/09/22   [name], MD  cetirizine (ZYRTEC) 10 MG tablet Take 10 mg by mouth daily. IN THE MORNING    [provider]  diclofenac Sodium (VOLTAREN) 1 % GEL Apply three times daily to knees as needed for pain 12/31/20   [name], MD  docusate sodium (COLACE) 100 MG capsule Take 100 mg by mouth at bedtime.     [provider]  donepezil (ARICEPT) 10 MG tablet TAKE 1 TABLET BY MOUTH AT BEDTIME 01/24/22   [name], MD  furosemide (LASIX) 20 MG tablet Take 1 tablet (20 mg total) by mouth daily as needed. 11/28/21   [name], MD  ipratropium (ATROVENT) 0.03 % nasal spray Place 2 sprays into both nostrils 3 (three) times daily. 10/18/21   [name], MD  lansoprazole (PREVACID) 15 MG capsule Take 15 mg by mouth daily.  05/28/17   [provider]  lidocaine-prilocaine (EMLA) cream Apply 1 application  topically See admin instructions. ONE HOUR PRIOR TO CHEMOTHERAPY APPOINTMENT 03/07/21   [name], MD  magnesium oxide (MAG-OX) 400 (240 Mg) MG tablet TAKE 1 TABLET BY MOUTH THREE TIMES DAILY 09/29/21   [name], MD  oxyCODONE (OXY IR/ROXICODONE) 5 MG immediate release tablet Take 1-2 tablets (5-10 mg total) by mouth every 6 (six) hours as needed. 03/27/22   [name], MD  SF 5000 PLUS 1.1 % CREA dental cream SMARTSIG:Sparingly By Mouth Every Night 10/11/20   [provider]  tamsulosin (FLOMAX) 0.4 MG CAPS capsule TAKE 1 CAPSULE BY MOUTH IN THE EVENING 09/29/21   [name], MD  venetoclax (VENCLEXTA) 100 MG tablet TAKE 2 TABLETS (200 MG) BY MOUTH DAILY 01/06/22 01/06/23  [name], MD      Allergies    Patient has no known allergies.    Review of Systems   Review of Systems  Respiratory:  Positive for cough.     Physical Exam   Vitals:   05/22/22 1054  BP: 131/80  Pulse: 74  Temp: 98 F (36.7 C)  SpO2: 98%    CONSTITUTIONAL:  well-appearing, NAD NEURO:  Alert and oriented x 3, CN 3-12 grossly intact EYES:  eyes equal and reactive ENT/NECK:  Supple, no stridor  CARDIO:  regular rate and rhythm, appears well-perfused  PULM:  No respiratory distress, CTAB  MSK/SPINE:  No gross deformities, no edema, moves all extremities  SKIN:  no rash, atraumatic   *Additional and/or pertinent findings included in MDM below   ED Results / Procedures / Treatments   Labs (all labs ordered are listed, but only abnormal results are displayed) Labs Reviewed  RESP PANEL BY RT-PCR (RSV, FLU A&B, COVID)  [id number] - Abnormal; Notable for the following components:      Result Value   Resp Syncytial Virus by PCR POSITIVE (*)    All other components within normal limits    EKG None  Radiology DG Chest Portable 1 View  Result Date: 05/22/2022 CLINICAL DATA:  Cough.  Cold symptoms. EXAM: PORTABLE CHEST 1 VIEW COMPARISON:  November 26, 2020  FINDINGS: A left Port-A-Cath is identified with the distal tip in the SVC, in stable position. No pneumothorax. Stable cardiomegaly. The hila and mediastinum are unchanged with a tortuous thoracic aorta. No pulmonary nodule, mass, or focal infiltrate. IMPRESSION: No active disease. The left Port-A-Cath remains in good position. Electronically Signed   By: Dorise Bullion III M.D.   On: 05/22/2022 11:19    Procedures Procedures    Medications Ordered in ED Medications - No data to display  ED Course/ Medical Decision Making/ A&P                             Medical Decision Making Amount and/or Complexity of Data Reviewed Radiology: ordered.  Risk OTC drugs.   77 year old male who is well-appearing, nontoxic and hemodynamically stable presenting for cough and congestion.  Physical exam was unremarkable.  Respiratory PCR revealed that he is positive for RSV.  Symptoms consistent with this diagnosis.  Advised conservative treatment at home.  Discussed return precautions.  Advised him to follow-up with his PCP if symptoms persisted.  I also independently reviewed and interpreted his chest x-ray that were negative for acute cardiopulmonary process.  Considered pneumonia but unlikely given these chest x-ray findings.        Final Clinical Impression(s) / ED Diagnoses Final diagnoses:  RSV (respiratory syncytial virus infection)    Rx / DC Orders ED Discharge Orders     None         Harriet Pho, PA-C 05/22/22 1857    Hayden Rasmussen, MD 05/23/22 1036

## 2022-05-22 NOTE — Discharge Instructions (Addendum)
Evaluation of your cough and congestion revealed that you do have RSV which is a respiratory viral infection.  Recommend they continue conservative treatment at home.  If you have new shortness of breath, chest pain, uncontrolled fever or any other concerning symptom please return to the emergency department for further evaluation.  I sent prescription for Robitussin to your pharmacy to help with

## 2022-05-23 ENCOUNTER — Ambulatory Visit: Payer: Medicare Other

## 2022-05-23 ENCOUNTER — Telehealth: Payer: Self-pay | Admitting: *Deleted

## 2022-05-23 NOTE — Telephone Encounter (Signed)
Called to follow up to ER visit from yesterday.  Patient was diagnosed with RSV and symptoms began on 05/16/21.  Starting to feel better.  Per guidelines, treatment and visit with provider will be scheduled for after 05/26/21.

## 2022-05-24 ENCOUNTER — Ambulatory Visit: Payer: Medicare Other

## 2022-05-25 ENCOUNTER — Other Ambulatory Visit: Payer: Self-pay | Admitting: *Deleted

## 2022-05-25 ENCOUNTER — Ambulatory Visit: Payer: Medicare Other

## 2022-05-25 DIAGNOSIS — C9200 Acute myeloblastic leukemia, not having achieved remission: Secondary | ICD-10-CM

## 2022-05-25 MED ORDER — TAMSULOSIN HCL 0.4 MG PO CAPS: ORAL_CAPSULE | ORAL | 6 refills | Status: DC

## 2022-05-26 ENCOUNTER — Other Ambulatory Visit (HOSPITAL_COMMUNITY): Payer: Self-pay

## 2022-05-26 ENCOUNTER — Ambulatory Visit: Payer: Medicare Other

## 2022-05-29 ENCOUNTER — Inpatient Hospital Stay: Payer: Medicare Other

## 2022-05-29 ENCOUNTER — Inpatient Hospital Stay (HOSPITAL_BASED_OUTPATIENT_CLINIC_OR_DEPARTMENT_OTHER): Payer: Medicare Other | Admitting: Hematology

## 2022-05-29 ENCOUNTER — Inpatient Hospital Stay: Payer: Medicare Other | Attending: Hematology

## 2022-05-29 ENCOUNTER — Encounter: Payer: Self-pay | Admitting: Hematology

## 2022-05-29 VITALS — BP 136/89 | HR 96 | Temp 98.6°F | Resp 18 | Ht 69.5 in | Wt 214.5 lb

## 2022-05-29 VITALS — BP 134/81 | HR 84 | Temp 98.6°F | Resp 17

## 2022-05-29 DIAGNOSIS — D46Z Other myelodysplastic syndromes: Secondary | ICD-10-CM

## 2022-05-29 DIAGNOSIS — Z5111 Encounter for antineoplastic chemotherapy: Secondary | ICD-10-CM | POA: Insufficient documentation

## 2022-05-29 DIAGNOSIS — C9200 Acute myeloblastic leukemia, not having achieved remission: Secondary | ICD-10-CM

## 2022-05-29 DIAGNOSIS — Z95828 Presence of other vascular implants and grafts: Secondary | ICD-10-CM | POA: Diagnosis not present

## 2022-05-29 LAB — MAGNESIUM: Magnesium: 2 mg/dL (ref 1.7–2.4)

## 2022-05-29 LAB — COMPREHENSIVE METABOLIC PANEL
ALT: 27 U/L (ref 0–44)
AST: 22 U/L (ref 15–41)
Albumin: 3.9 g/dL (ref 3.5–5.0)
Alkaline Phosphatase: 63 U/L (ref 38–126)
Anion gap: 7 (ref 5–15)
BUN: 18 mg/dL (ref 8–23)
CO2: 26 mmol/L (ref 22–32)
Calcium: 9.7 mg/dL (ref 8.9–10.3)
Chloride: 105 mmol/L (ref 98–111)
Creatinine, Ser: 1.02 mg/dL (ref 0.61–1.24)
GFR, Estimated: >60 mL/min (ref >60)
Glucose, Bld: 114 mg/dL — ABNORMAL HIGH (ref 70–99)
Potassium: 4.3 mmol/L (ref 3.5–5.1)
Sodium: 138 mmol/L (ref 135–145)
Total Bilirubin: 0.7 mg/dL (ref 0.3–1.2)
Total Protein: 6.7 g/dL (ref 6.5–8.1)

## 2022-05-29 LAB — CBC WITH DIFFERENTIAL/PLATELET
Abs Immature Granulocytes: 0.19 10*3/uL — ABNORMAL HIGH (ref 0.00–0.07)
Basophils Absolute: 0.1 10*3/uL (ref 0.0–0.1)
Basophils Relative: 1 %
Eosinophils Absolute: 0.2 10*3/uL (ref 0.0–0.5)
Eosinophils Relative: 2 %
HCT: 49.9 % (ref 39.0–52.0)
Hemoglobin: 16.4 g/dL (ref 13.0–17.0)
Immature Granulocytes: 2 %
Lymphocytes Relative: 16 %
Lymphs Abs: 1.4 10*3/uL (ref 0.7–4.0)
MCH: 30.3 pg (ref 26.0–34.0)
MCHC: 32.9 g/dL (ref 30.0–36.0)
MCV: 92.1 fL (ref 80.0–100.0)
Monocytes Absolute: 0.9 10*3/uL (ref 0.1–1.0)
Monocytes Relative: 10 %
Neutro Abs: 6.1 10*3/uL (ref 1.7–7.7)
Neutrophils Relative %: 69 %
Platelets: 142 10*3/uL — ABNORMAL LOW (ref 150–400)
RBC: 5.42 MIL/uL (ref 4.22–5.81)
RDW: 13.9 % (ref 11.5–15.5)
WBC: 8.7 10*3/uL (ref 4.0–10.5)
nRBC: 0 % (ref 0.0–0.2)

## 2022-05-29 MED ORDER — ONDANSETRON HCL 4 MG/2ML IJ SOLN
4.0000 mg | Freq: Once | INTRAMUSCULAR | Status: AC
  Administered 2022-05-29: 4 mg via INTRAVENOUS
  Filled 2022-05-29: qty 2

## 2022-05-29 MED ORDER — HEPARIN SOD (PORK) LOCK FLUSH 100 UNIT/ML IV SOLN
500.0000 [IU] | Freq: Once | INTRAVENOUS | Status: AC | PRN
  Administered 2022-05-29: 500 [IU]

## 2022-05-29 MED ORDER — SODIUM CHLORIDE 0.9 % IV SOLN: Freq: Once | INTRAVENOUS | Status: AC

## 2022-05-29 MED ORDER — SODIUM CHLORIDE 0.9% FLUSH
10.0000 mL | INTRAVENOUS | Status: DC | PRN
  Administered 2022-05-29: 10 mL

## 2022-05-29 MED ORDER — SODIUM CHLORIDE 0.9 % IV SOLN
15.0000 mg/m2 | Freq: Once | INTRAVENOUS | Status: AC
  Administered 2022-05-29: 35 mg via INTRAVENOUS
  Filled 2022-05-29: qty 7

## 2022-05-29 NOTE — Patient Instructions (Signed)
[facility]  Discharge Instructions: Thank you for choosing [facility] to provide your oncology and hematology care.  If you have a lab appointment with the [facility], please come in thru the Main Entrance and check in at the main information desk.  Wear comfortable clothing and clothing appropriate for easy access to any Portacath or PICC line.   We strive to give you quality time with your provider. You may need to reschedule your appointment if you arrive late (15 or more minutes).  Arriving late affects you and other patients whose appointments are after yours.  Also, if you miss three or more appointments without notifying the office, you may be dismissed from the clinic at the provider's discretion.      For prescription refill requests, have your pharmacy contact our office and allow 72 hours for refills to be completed.    Today you received the following chemotherapy and/or immunotherapy agents Decitabine.  Decitabine Injection What is this medication? DECITABINE (dee SYE ta been) treats blood and bone marrow cancers. It works by slowing down the growth of cancer cells. This medicine may be used for other purposes; ask your health care provider or pharmacist if you have questions. COMMON BRAND NAME(S): Dacogen What should I tell my care team before I take this medication? They need to know if you have any of these conditions: Infection Kidney disease Liver disease An unusual or allergic reaction to decitabine, other medications, foods, dyes, or preservatives Pregnant or trying to get pregnant Breast-feeding How should I use this medication? This medication is infused into a vein. It is given by your care team in a hospital or clinic setting. Talk to your care team about the use of this medication in children. Special care may be needed. Overdosage: If you think you have taken too much of this medicine contact a poison control center or  emergency room at once. NOTE: This medicine is only for you. Do not share this medicine with others. What if I miss a dose? Keep appointments for follow-up doses. It is important not to miss your dose. Call your care team if you are unable to keep an appointment. What may interact with this medication? Interactions are not expected. This list may not describe all possible interactions. Give your health care provider a list of all the medicines, herbs, non-prescription drugs, or dietary supplements you use. Also tell them if you smoke, drink alcohol, or use illegal drugs. Some items may interact with your medicine. What should I watch for while using this medication? Visit your care team for regular checks on your progress. You may need blood work while taking this medication. This medication may make you feel generally unwell. This is not uncommon as chemotherapy can affect healthy cells as well as cancer cells. Report any side effects. Continue your course of treatment even though you feel ill unless your care team tells you to stop. This medication may increase your risk of getting an infection. Call your care team for advice if you get a fever, chills, sore throat, or other symptoms of a cold or flu. Do not treat yourself. Try to avoid being around people who are sick. Be careful brushing or flossing your teeth or using a toothpick because you may get an infection or bleed more easily. If you have any dental work done, tell your dentist you are receiving this medication. Call your care team if you are around anyone with measles, chickenpox, or  if you develop sores or blisters that do not heal properly. Avoid taking medications that contain aspirin, acetaminophen, ibuprofen, naproxen, or ketoprofen unless instructed by your care team. These medications may hide a fever. Talk to your care team if you or your partner wish to become pregnant or think either of you might be pregnant. This medication can  cause serious birth defects if taken during pregnancy or for 6 months after the last dose. A negative pregnancy test is required before starting this medication. A reliable form of contraception is recommended while taking this medication and for 6 months after the last dose. Talk to your care team about reliable forms of contraception. Do not father a child while taking this medication or for 3 months after the last dose. Use a condom while having sex during this time period. Do not breast-feed while taking this medication and for 2 weeks after the last dose. This medication may cause infertility. Talk to your care team if you are concerned about your fertility. What side effects may I notice from receiving this medication? Side effects that you should report to your care team as soon as possible: Allergic reactions--skin rash, itching, hives, swelling of the face, lips, tongue, or throat Infection--fever, chills, cough, sore throat, wounds that don't heal, pain or trouble when passing urine, general feeling of discomfort or being unwell Low red blood cell level--unusual weakness or fatigue, dizziness, headache, trouble breathing Unusual bruising or bleeding Side effects that usually do not require medical attention (report to your care team if they continue or are bothersome): Constipation Diarrhea Fatigue Nausea Pain, redness, or swelling with sores inside the mouth or throat Stomach pain This list may not describe all possible side effects. Call your doctor for medical advice about side effects. You may report side effects to FDA at 1-800-FDA-1088. Where should I keep my medication? This medication is given in a hospital or clinic. It will not be stored at home. NOTE: This sheet is a summary. It may not cover all possible information. If you have questions about this medicine, talk to your doctor, pharmacist, or health care provider.  2023 Elsevier/Gold Standard (2021-08-01 00:00:00)         To help prevent nausea and vomiting after your treatment, we encourage you to take your nausea medication as directed.  BELOW ARE SYMPTOMS THAT SHOULD BE REPORTED IMMEDIATELY: *FEVER GREATER THAN 100.4 F (38 C) OR HIGHER *CHILLS OR SWEATING *NAUSEA AND VOMITING THAT IS NOT CONTROLLED WITH YOUR NAUSEA MEDICATION *UNUSUAL SHORTNESS OF BREATH *UNUSUAL BRUISING OR BLEEDING *URINARY PROBLEMS (pain or burning when urinating, or frequent urination) *BOWEL PROBLEMS (unusual diarrhea, constipation, pain near the anus) TENDERNESS IN MOUTH AND THROAT WITH OR WITHOUT PRESENCE OF ULCERS (sore throat, sores in mouth, or a toothache) UNUSUAL RASH, SWELLING OR PAIN  UNUSUAL VAGINAL DISCHARGE OR ITCHING   Items with * indicate a potential emergency and should be followed up as soon as possible or go to the Emergency Department if any problems should occur.  Please show the CHEMOTHERAPY ALERT CARD or IMMUNOTHERAPY ALERT CARD at check-in to the Emergency Department and triage nurse.  Should you have questions after your visit or need to cancel or reschedule your appointment, please contact [facility] [phone]  and follow the prompts.  Office hours are 8:00 a.m. to 4:30 p.m. Monday - Friday. Please note that voicemails left after 4:00 p.m. may not be returned until the following business day.  We are closed weekends and major  holidays. You have access to a nurse at all times for urgent questions. Please call the main number to the clinic [phone] and follow the prompts.  For any non-urgent questions, you may also contact your provider using MyChart. We now offer e-Visits for anyone 71 and older to request care online for non-urgent symptoms. For details visit [email or web address].   Also download the MyChart app! Go to the app store, search "MyChart", open the app, select [facility], and log in with your MyChart username and password.

## 2022-05-29 NOTE — Progress Notes (Signed)
Patient is taking Venetoclax as prescribed.  He has not missed any doses and reports no side effects at this time.    Patient has been examined by Dr. Delton Coombes, and vital signs and labs have been reviewed. ANC, Creatinine, LFTs, hemoglobin, and platelets are within treatment parameters per M.D. - pt may proceed with treatment.  Primary RN and pharmacy notified.

## 2022-05-29 NOTE — Progress Notes (Signed)
Richard Wallace, [place] [address]   CLINIC:  Medical Oncology/Hematology  PCP:  Pcp, No None None   REASON FOR VISIT:  Follow-up for AML  PRIOR THERAPY:  none  NGS Results: not done  CURRENT THERAPY: Decitabine every 6 weeks & venetoclax 200 mg x2 weeks after chemo  BRIEF ONCOLOGIC HISTORY:  Oncology History  MDS (myelodysplastic syndrome), high grade (HCC)  08/14/2018 Initial Diagnosis   MDS (myelodysplastic syndrome), high grade ([facility])   08/22/2018 - 12/06/2018 Chemotherapy   The patient had palonosetron (ALOXI) injection 0.25 mg, 0.25 mg, Intravenous,  Once, 4 of 6 cycles Administration: 0.25 mg (08/22/2018), 0.25 mg (08/26/2018), 0.25 mg (08/28/2018), 0.25 mg (08/30/2018), 0.25 mg (10/01/2018), 0.25 mg (10/02/2018), 0.25 mg (11/04/2018), 0.25 mg (09/23/2018), 0.25 mg (09/25/2018), 0.25 mg (09/27/2018), 0.25 mg (10/28/2018), 0.25 mg (10/30/2018), 0.25 mg (11/01/2018), 0.25 mg (12/02/2018), 0.25 mg (12/04/2018), 0.25 mg (12/06/2018) azaCITIDine (VIDAZA) 100 mg in sodium chloride 0.9 % 50 mL chemo infusion, 110 mg (66.7 % of original dose 75 mg/m2), Intravenous, Once, 4 of 6 cycles Dose modification: 50 mg/m2 (original dose 75 mg/m2, Cycle 1, Reason: Provider Judgment), 50 mg/m2 (original dose 75 mg/m2, Cycle 2, Reason: Provider Judgment) Administration: 100 mg (08/22/2018), 100 mg (08/23/2018), 100 mg (08/26/2018), 100 mg (08/27/2018), 100 mg (08/28/2018), 100 mg (08/29/2018), 100 mg (08/30/2018), 100 mg (10/01/2018), 100 mg (10/02/2018), 100 mg (11/04/2018), 100 mg (11/05/2018), 100 mg (09/23/2018), 100 mg (09/24/2018), 100 mg (09/25/2018), 100 mg (09/26/2018), 100 mg (09/27/2018), 100 mg (10/28/2018), 100 mg (10/29/2018), 100 mg (10/30/2018), 100 mg (10/31/2018), 100 mg (11/01/2018), 100 mg (12/02/2018), 100 mg (12/03/2018), 100 mg (12/04/2018), 100 mg (12/05/2018), 100 mg (12/06/2018)  for chemotherapy treatment.    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  INTERVAL HISTORY:  [name], a 77 y.o. male, seen for follow-up of AML and toxicity assessment prior to next cycle of chemotherapy.  Energy levels are 25%.  He had RSV infection last week and his treatment got delayed.  He still has some cough which is improving.  Overall he feels like he is back to his baseline.  REVIEW OF SYSTEMS:  Review of Systems  Respiratory:  Positive for cough.   Gastrointestinal:  Negative for vomiting.  Musculoskeletal:  Positive for arthralgias (knee).  All other systems reviewed and are negative.   PAST MEDICAL/SURGICAL HISTORY:  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   Cancer ([facility]) 01-28-2014   skin cancer   CKD (chronic kidney disease), stage III (HCC)    GERD (gastroesophageal reflux disease)    Heart murmur    NOTED DURING PHYSICAL WHEN HE WAS ENLISTING IN [facility] , DIDNT KNOW UNTIL THAT TIME AND REPORTS , "THATS THE LAST I HEARD ABOUT IT "  History of hypertension    no longer issue   History of kidney stones    History of malignant melanoma of skin    excision top of scalp 2015-- no recurrence   History of urinary retention    post op lumbar fusion surgery 04/ 2016   Hypertension    Kidney dysfunction    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, MONITORED BY [facility] DR Annie Main DAHLSTEDT    Left ureteral calculus    Seasonal allergies     Wears glasses    Wears glasses    Wears partial dentures    upper and lower   Past Surgical History:  Procedure Laterality Date   ANKLE FUSION Right 2007   CARPAL TUNNEL RELEASE Left 12/28/2009   w/ pulley release left long finger   CARPAL TUNNEL RELEASE Right 07/22/2013   Procedure: RIGHT CARPAL TUNNEL RELEASE;  Surgeon: Cammie Sickle., MD;  Location: [facility];  Service: Orthopedics;  Laterality: Right;   COLONOSCOPY     CYSTO/  LEFT RETROGRADE PYELOGRAM  11/21/2010   CYSTOSCOPY WITH STENT PLACEMENT Left 03/09/2016   Procedure: CYSTOSCOPY WITH STENT PLACEMENT;  Surgeon: Franchot Gallo, MD;  Location: [facility];  Service: Urology;  Laterality: Left;   CYSTOSCOPY/RETROGRADE/URETEROSCOPY/STONE EXTRACTION WITH BASKET Left 03/09/2016   Procedure: CYSTOSCOPY/RETROGRADE/URETEROSCOPY/STONE EXTRACTION WITH BASKET;  Surgeon: Franchot Gallo, MD;  Location: [facility];  Service: Urology;  Laterality: Left;   LEFT URETEROSCOPIC LASER LITHOTRIPSY STONE EXTRACTION/ STENT PLACEMENT  05/23/2010   MOHS SURGERY     TOP OF THE HEAD    ORIF ANKLE FRACTURE Right 1978   PORT-A-CATH REMOVAL Right 02/14/2019   Procedure: MINOR REMOVAL PORT-A-CATH;  Surgeon: Aviva Signs, MD;  Location: [facility];  Service: General;  Laterality: Right;   PORTACATH PLACEMENT Right 08/19/2018   Procedure: INSERTION PORT-A-CATH (attached catheter in right subclavian);  Surgeon: Aviva Signs, MD;  Location: [facility];  Service: General;  Laterality: Right;   PORTACATH PLACEMENT Left 05/16/2019   Procedure: INSERTION PORT-A-CATH (attached catheter in left subclavian);  Surgeon: Aviva Signs, MD;  Location: [facility];  Service: General;  Laterality: Left;   POSTERIOR LUMBAR FUSION  08/21/2014   laminectomy and decompression L2 -- L5   RIGHT LOWER LEG SURGERY  X3  1975 to 1976   including ORIF   TONSILLECTOMY AND ADENOIDECTOMY  8242   UMBILICAL HERNIA REPAIR  2009 approx     SOCIAL HISTORY:  Social History   Socioeconomic History   Marital status: Married    Spouse name: Not on file   Number of children: Not on file   Years of education: Not on file   Highest education level: Not on file  Occupational History   Not on file  Tobacco Use   Smoking status: Former    Years: 20.00    Types: Cigarettes    Quit date: 07/17/1986    Years since quitting: 35.8   Smokeless tobacco: Never  Vaping Use   Vaping Use: Never used  Substance and Sexual Activity   Alcohol use: Yes    Alcohol/week: 7.0 - 14.0 standard drinks of alcohol    Types: 7 - 14 Cans of beer per week    Comment: 1 -2 beer daily   Drug use: No   Sexual activity: Not Currently  Other Topics Concern   Not on file  Social History Narrative   Not on file   Social Determinants of Health   Financial Resource Strain: Low Risk  (  03/23/2020)   Overall Financial Resource Strain (CARDIA)    Difficulty of Paying Living Expenses: Not hard at all  Food Insecurity: No Food Insecurity (03/23/2020)   Hunger Vital Sign    Worried About Running Out of Food in the Last Year: Never true    Ran Out of Food in the Last Year: Never true  Transportation Needs: No Transportation Needs (03/23/2020)   PRAPARE - Hydrologist (Medical): No    Lack of Transportation (Non-Medical): No  Physical Activity: Unknown (02/17/2019)   Exercise Vital Sign    Days of Exercise per Week: Patient refused    Minutes of Exercise per Session: Patient refused  Stress: No Stress Concern Present (03/23/2020)   [facility]    Feeling of Stress : Not at all  Social Connections: Moderately Isolated (03/23/2020)   Social Connection and Isolation Panel [NHANES]    Frequency of Communication with Friends and Family: Twice a week    Frequency of Social Gatherings with Friends and Family: Never    Attends Religious Services: Never     Marine scientist or Organizations: Yes    Attends Music therapist: More than 4 times per year    Marital Status: Married  Human resources officer Violence: Unknown (02/17/2019)   Humiliation, Afraid, Rape, and Kick questionnaire    Fear of Current or Ex-Partner: Patient refused    Emotionally Abused: Patient refused    Physically Abused: Patient refused    Sexually Abused: Patient refused    FAMILY HISTORY:  Family History  Problem Relation Age of Onset   Stroke Mother    Prostate cancer Father    Bone cancer Father    Diverticulitis Father    Rheum arthritis Sister    Urinary tract infection Sister    Colon cancer Neg Hx     CURRENT MEDICATIONS:  Current Outpatient Medications  Medication Sig Dispense Refill   ACETAMINOPHEN EXTRA STRENGTH 500 MG capsule      allopurinol (ZYLOPRIM) 300 MG tablet Take 300 mg by mouth daily.     ALPRAZolam (XANAX) 0.25 MG tablet Take 1 tablet by mouth twice daily as needed for anxiety 60 tablet 0   amLODipine (NORVASC) 2.5 MG tablet Take 1 tablet by mouth once daily 90 tablet 0   benzonatate (TESSALON) 200 MG capsule Take 1 capsule 3 times a day by oral route as needed.     cetirizine (ZYRTEC) 10 MG tablet Take 10 mg by mouth daily. IN THE MORNING     diclofenac Sodium (VOLTAREN) 1 % GEL Apply three times daily to knees as needed for pain 50 g 3   docusate sodium (COLACE) 100 MG capsule Take 100 mg by mouth at bedtime.      donepezil (ARICEPT) 10 MG tablet TAKE 1 TABLET BY MOUTH AT BEDTIME 90 tablet 3   furosemide (LASIX) 20 MG tablet Take 1 tablet (20 mg total) by mouth daily as needed. 30 tablet 2   guaiFENesin-dextromethorphan (ROBITUSSIN DM) 100-10 MG/5ML syrup Take 5 mLs by mouth every 4 (four) hours as needed for cough. 118 mL 0   HYDROcodone-acetaminophen (NORCO) 10-325 MG tablet Take 1 tablet by mouth every 6 (six) hours as needed.     ipratropium (ATROVENT) 0.03 % nasal spray Place 2 sprays into both nostrils 3 (three) times  daily. 30 mL 6   lansoprazole (PREVACID) 15 MG capsule Take 15 mg by mouth daily.  lidocaine-prilocaine (EMLA) cream Apply 1 application topically See admin instructions. ONE HOUR PRIOR TO CHEMOTHERAPY APPOINTMENT 30 g 6   magnesium oxide (MAG-OX) 400 (240 Mg) MG tablet TAKE 1 TABLET BY MOUTH THREE TIMES DAILY 90 tablet 3   oxyCODONE (OXY IR/ROXICODONE) 5 MG immediate release tablet Take 1-2 tablets (5-10 mg total) by mouth every 6 (six) hours as needed. 120 tablet 0   SF 5000 PLUS 1.1 % CREA dental cream SMARTSIG:Sparingly By Mouth Every Night     tamsulosin (FLOMAX) 0.4 MG CAPS capsule TAKE 1 CAPSULE BY MOUTH IN THE EVENING 30 capsule 6   tiZANidine (ZANAFLEX) 2 MG tablet TAKE 1 TABLET BY MOUTH EVERY 8 HOURS AS NEEDED FOR MUSCLE SPASM     venetoclax (VENCLEXTA) 100 MG tablet TAKE 2 TABLETS (200 MG) BY MOUTH DAILY 60 tablet 2   No current facility-administered medications for this visit.   Facility-Administered Medications Ordered in Other Visits  Medication Dose Route Frequency Provider Last Rate Last Admin   octreotide (SANDOSTATIN LAR) 30 MG IM injection            sodium chloride flush (NS) 0.9 % injection 20 mL  20 mL Intravenous PRN [name], MD   20 mL at 07/21/19 1053    ALLERGIES:  No Known Allergies  PHYSICAL EXAM:  Performance status (ECOG): 1 - Symptomatic but completely ambulatory  Vitals:   05/29/22 0922  BP: 136/89  Pulse: 96  Resp: 18  Temp: 98.6 F (37 C)  SpO2: 96%    Wt Readings from Last 3 Encounters:  05/29/22 214 lb 8 oz (97.3 kg)  05/22/22 220 lb (99.8 kg)  04/25/22 222 lb (100.7 kg)   Physical Exam Vitals reviewed.  Constitutional:      Appearance: Normal appearance. He is obese.     Comments: In wheelchair  Cardiovascular:     Rate and Rhythm: Normal rate and regular rhythm.     Pulses: Normal pulses.     Heart sounds: Normal heart sounds.  Pulmonary:     Effort: Pulmonary effort is normal.     Breath sounds: Normal breath  sounds.  Musculoskeletal:     Right lower leg: 3+ Edema present.     Left lower leg: Edema (trace) present.  Neurological:     General: No focal deficit present.     Mental Status: He is alert and oriented to person, place, and time.  Psychiatric:        Mood and Affect: Mood normal.        Behavior: Behavior normal.     LABORATORY DATA:  I have reviewed the labs as listed.     Latest Ref Rng & Units 05/29/2022    8:34 AM 04/25/2022   10:51 AM 04/10/2022    9:20 AM  CBC  WBC 4.0 - 10.5 K/uL 8.7  5.7  5.0   Hemoglobin 13.0 - 17.0 g/dL 16.4  14.6  15.6   Hematocrit 39.0 - 52.0 % 49.9  44.7  46.5   Platelets 150 - 400 K/uL 142  95  156       Latest Ref Rng & Units 05/29/2022    8:34 AM 04/25/2022   10:51 AM 04/10/2022    9:20 AM  CMP  Glucose 70 - 99 mg/dL 114  102  106   BUN 8 - 23 mg/dL '18  19  18   '$ Creatinine 0.61 - 1.24 mg/dL 1.02  0.98  1.00   Sodium 135 - 145 mmol/L 138  142  142   Potassium 3.5 - 5.1 mmol/L 4.3  4.1  4.2   Chloride 98 - 111 mmol/L 105  109  108   CO2 22 - 32 mmol/L '26  26  25   '$ Calcium 8.9 - 10.3 mg/dL 9.7  9.2  9.4   Total Protein 6.5 - 8.1 g/dL 6.7  6.2  6.5   Total Bilirubin 0.3 - 1.2 mg/dL 0.7  1.0  1.0   Alkaline Phos 38 - 126 U/L 63  60  71   AST 15 - 41 U/L '22  22  24   '$ ALT 0 - 44 U/L 27  33  29     DIAGNOSTIC IMAGING:  I have independently reviewed the scans and discussed with the patient. DG Chest Portable 1 View  Result Date: 05/22/2022 CLINICAL DATA:  Cough.  Cold symptoms. EXAM: PORTABLE CHEST 1 VIEW COMPARISON:  November 26, 2020 FINDINGS: A left Port-A-Cath is identified with the distal tip in the SVC, in stable position. No pneumothorax. Stable cardiomegaly. The hila and mediastinum are unchanged with a tortuous thoracic aorta. No pulmonary nodule, mass, or focal infiltrate. IMPRESSION: No active disease. The left Port-A-Cath remains in good position. Electronically Signed   By: Dorise Bullion III M.D.   On: 05/22/2022 11:19      ASSESSMENT:  1.  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  -BMBX on 02/25/2019 after cycle 1 did not show any evidence of leukemia. -CT CAP on 02/12/2019 shows numerous bilateral irregular/spiculated pulmonary nodules measuring up to 14 mm, nonspecific.  Hepatic steatosis.  Spleen is normal. -I have talked to Dr[facility]Ravandi at [facility] per patient request.  There are clinical trials available upon progression of his AML.  There are also some clinical trials available now based on MRD positivity.  Patient not interested in moving to [place] at this time.   PLAN:  1.  AML: - Reviewed labs today which showed normal LFTs, creatinine.  CBC was grossly normal with mild thrombocytopenia. - Proceed with next cycle of decitabine and venetoclax. - He will take venetoclax 200 mg for 2 weeks and stop it.  Will plan to see him back in 6 weeks for follow-up.   2.  Arthralgias: - Continue hydrocodone 10/325 every 6 hours as needed.   3.  Hypomagnesemia: - Continue magnesium  3 times daily.  Magnesium is normal.   4.  Hypertension: - Continue amlodipine 2.5 mg daily.  Blood pressure is well-controlled.   5.  Dementia: - Continue Aricept 10 mg daily.  6.  Right leg swelling: - Continue Lasix 20 mg as needed.   Orders placed this encounter:  Orders Placed This Encounter  Procedures   CBC with Differential   Comprehensive metabolic panel   CBC with Differential   Comprehensive metabolic panel   CBC with Differential   Comprehensive metabolic panel       [name], MD [facility] [phone]

## 2022-05-29 NOTE — Patient Instructions (Addendum)
[facility] at [facility] Discharge Instructions   You were seen and examined today by Dr. Katragadda.  He reviewed the results of your lab work which are normal/stable.   We will proceed with your treatment today.  Return as scheduled.    Thank you for choosing [facility] at [facility] to provide your oncology and hematology care.  To afford each patient quality time with our provider, please arrive at least 15 minutes before your scheduled appointment time.   If you have a lab appointment with the Cancer Center please come in thru the Main Entrance and check in at the main information desk.  You need to re-schedule your appointment should you arrive 10 or more minutes late.  We strive to give you quality time with our providers, and arriving late affects you and other patients whose appointments are after yours.  Also, if you no show three or more times for appointments you may be dismissed from the clinic at the providers discretion.     Again, thank you for choosing [facility].  Our hope is that these requests will decrease the amount of time that you wait before being seen by our physicians.       _____________________________________________________________  Should you have questions after your visit to  Cancer Center, please contact our office at [phone] and follow the prompts.  Our office hours are 8:00 a.m. and 4:30 p.m. Monday - Friday.  Please note that voicemails left after 4:00 p.m. may not be returned until the following business day.  We are closed weekends and major holidays.  You do have access to a nurse 24-7, just call the main number to the clinic [phone] and do not press any options, hold on the line and a nurse will answer the phone.    For prescription refill requests, have your pharmacy contact our office and allow 72 hours.    Due to Covid, you will need to wear a mask upon entering  the hospital. If you do not have a mask, a mask will be given to you at the Main Entrance upon arrival. For doctor visits, patients may have 1 support person age 18 or older with them. For treatment visits, patients can not have anyone with them due to social distancing guidelines and our immunocompromised population.      

## 2022-05-29 NOTE — Progress Notes (Signed)
Patient presents today for chemotherapy infusion.  Patient is in satisfactory condition with no new complaints voiced.  Vital signs are stable.  Labs reviewed by Dr. Delton Coombes during his office visit.  All labs are within treatment parameters.  We will proceed with treatment per MD orders.  Patient tolerated treatment well with no complaints voiced.  Patient left via motorized wheelchair in stable condition.  Vital signs stable at discharge.  Follow up as scheduled.

## 2022-05-30 ENCOUNTER — Other Ambulatory Visit: Payer: Self-pay

## 2022-05-30 ENCOUNTER — Inpatient Hospital Stay: Payer: Medicare Other

## 2022-05-30 VITALS — BP 119/81 | HR 85 | Temp 97.5°F | Resp 18

## 2022-05-30 DIAGNOSIS — C9200 Acute myeloblastic leukemia, not having achieved remission: Secondary | ICD-10-CM

## 2022-05-30 DIAGNOSIS — D46Z Other myelodysplastic syndromes: Secondary | ICD-10-CM

## 2022-05-30 DIAGNOSIS — Z95828 Presence of other vascular implants and grafts: Secondary | ICD-10-CM

## 2022-05-30 DIAGNOSIS — Z5111 Encounter for antineoplastic chemotherapy: Secondary | ICD-10-CM | POA: Diagnosis not present

## 2022-05-30 MED ORDER — TAMSULOSIN HCL 0.4 MG PO CAPS: 0.4000 mg | ORAL_CAPSULE | Freq: Two times a day (BID) | ORAL | 0 refills | Status: DC

## 2022-05-30 MED ORDER — SODIUM CHLORIDE 0.9 % IV SOLN: Freq: Once | INTRAVENOUS | Status: AC

## 2022-05-30 MED ORDER — HEPARIN SOD (PORK) LOCK FLUSH 100 UNIT/ML IV SOLN
500.0000 [IU] | Freq: Once | INTRAVENOUS | Status: AC | PRN
  Administered 2022-05-30: 500 [IU]

## 2022-05-30 MED ORDER — ONDANSETRON HCL 4 MG/2ML IJ SOLN
4.0000 mg | Freq: Once | INTRAMUSCULAR | Status: AC
  Administered 2022-05-30: 4 mg via INTRAVENOUS
  Filled 2022-05-30: qty 2

## 2022-05-30 MED ORDER — SODIUM CHLORIDE 0.9% FLUSH
10.0000 mL | INTRAVENOUS | Status: DC | PRN
  Administered 2022-05-30: 10 mL

## 2022-05-30 MED ORDER — SODIUM CHLORIDE 0.9 % IV SOLN
15.0000 mg/m2 | Freq: Once | INTRAVENOUS | Status: AC
  Administered 2022-05-30: 35 mg via INTRAVENOUS
  Filled 2022-05-30: qty 7

## 2022-05-30 NOTE — Progress Notes (Signed)
Patient presented for day 2 of treatment today. No new complaints at this time. Will proceed as planned with treatment.    Treatment given per orders. Patient tolerated it well without problems. Vitals stable and discharged home from clinic via wheelchair. Follow up as scheduled.

## 2022-05-30 NOTE — Patient Instructions (Signed)
[facility]  Discharge Instructions: Thank you for choosing [facility] to provide your oncology and hematology care.  If you have a lab appointment with the [facility], please come in thru the Main Entrance and check in at the main information desk.  Wear comfortable clothing and clothing appropriate for easy access to any Portacath or PICC line.   We strive to give you quality time with your provider. You may need to reschedule your appointment if you arrive late (15 or more minutes).  Arriving late affects you and other patients whose appointments are after yours.  Also, if you miss three or more appointments without notifying the office, you may be dismissed from the clinic at the provider's discretion.      For prescription refill requests, have your pharmacy contact our office and allow 72 hours for refills to be completed.    Today you received the following chemotherapy and/or immunotherapy agents dacitabine   To help prevent nausea and vomiting after your treatment, we encourage you to take your nausea medication as directed.  BELOW ARE SYMPTOMS THAT SHOULD BE REPORTED IMMEDIATELY: *FEVER GREATER THAN 100.4 F (38 C) OR HIGHER *CHILLS OR SWEATING *NAUSEA AND VOMITING THAT IS NOT CONTROLLED WITH YOUR NAUSEA MEDICATION *UNUSUAL SHORTNESS OF BREATH *UNUSUAL BRUISING OR BLEEDING *URINARY PROBLEMS (pain or burning when urinating, or frequent urination) *BOWEL PROBLEMS (unusual diarrhea, constipation, pain near the anus) TENDERNESS IN MOUTH AND THROAT WITH OR WITHOUT PRESENCE OF ULCERS (sore throat, sores in mouth, or a toothache) UNUSUAL RASH, SWELLING OR PAIN  UNUSUAL VAGINAL DISCHARGE OR ITCHING   Items with * indicate a potential emergency and should be followed up as soon as possible or go to the Emergency Department if any problems should occur.  Please show the CHEMOTHERAPY ALERT CARD or IMMUNOTHERAPY ALERT CARD at check-in to the Emergency  Department and triage nurse.  Should you have questions after your visit or need to cancel or reschedule your appointment, please contact [facility] [phone]  and follow the prompts.  Office hours are 8:00 a.m. to 4:30 p.m. Monday - Friday. Please note that voicemails left after 4:00 p.m. may not be returned until the following business day.  We are closed weekends and major holidays. You have access to a nurse at all times for urgent questions. Please call the main number to the clinic [phone] and follow the prompts.  For any non-urgent questions, you may also contact your provider using MyChart. We now offer e-Visits for anyone 63 and older to request care online for non-urgent symptoms. For details visit [email or web address].   Also download the MyChart app! Go to the app store, search "MyChart", open the app, select [facility], and log in with your MyChart username and password.

## 2022-05-31 ENCOUNTER — Ambulatory Visit: Payer: Medicare Other | Admitting: Urology

## 2022-05-31 ENCOUNTER — Inpatient Hospital Stay: Payer: Medicare Other

## 2022-05-31 VITALS — BP 134/85 | HR 74 | Temp 98.0°F | Resp 18

## 2022-05-31 DIAGNOSIS — C9200 Acute myeloblastic leukemia, not having achieved remission: Secondary | ICD-10-CM

## 2022-05-31 DIAGNOSIS — Z5111 Encounter for antineoplastic chemotherapy: Secondary | ICD-10-CM | POA: Diagnosis not present

## 2022-05-31 DIAGNOSIS — D46Z Other myelodysplastic syndromes: Secondary | ICD-10-CM

## 2022-05-31 DIAGNOSIS — Z95828 Presence of other vascular implants and grafts: Secondary | ICD-10-CM

## 2022-05-31 MED ORDER — HEPARIN SOD (PORK) LOCK FLUSH 100 UNIT/ML IV SOLN
500.0000 [IU] | Freq: Once | INTRAVENOUS | Status: AC | PRN
  Administered 2022-05-31: 500 [IU]

## 2022-05-31 MED ORDER — SODIUM CHLORIDE 0.9 % IV SOLN: Freq: Once | INTRAVENOUS | Status: AC

## 2022-05-31 MED ORDER — ONDANSETRON HCL 4 MG/2ML IJ SOLN
4.0000 mg | Freq: Once | INTRAMUSCULAR | Status: AC
  Administered 2022-05-31: 4 mg via INTRAVENOUS
  Filled 2022-05-31: qty 2

## 2022-05-31 MED ORDER — SODIUM CHLORIDE 0.9 % IV SOLN
15.0000 mg/m2 | Freq: Once | INTRAVENOUS | Status: AC
  Administered 2022-05-31: 35 mg via INTRAVENOUS
  Filled 2022-05-31: qty 7

## 2022-05-31 MED ORDER — SODIUM CHLORIDE 0.9% FLUSH
10.0000 mL | INTRAVENOUS | Status: DC | PRN
  Administered 2022-05-31: 10 mL

## 2022-05-31 NOTE — Patient Instructions (Signed)
[facility]  Discharge Instructions: Thank you for choosing [facility] to provide your oncology and hematology care.  If you have a lab appointment with the [facility], please come in thru the Main Entrance and check in at the main information desk.  Wear comfortable clothing and clothing appropriate for easy access to any Portacath or PICC line.   We strive to give you quality time with your provider. You may need to reschedule your appointment if you arrive late (15 or more minutes).  Arriving late affects you and other patients whose appointments are after yours.  Also, if you miss three or more appointments without notifying the office, you may be dismissed from the clinic at the provider's discretion.      For prescription refill requests, have your pharmacy contact our office and allow 72 hours for refills to be completed.    Today you received the following chemotherapy and/or immunotherapy agents decitabine      To help prevent nausea and vomiting after your treatment, we encourage you to take your nausea medication as directed.  BELOW ARE SYMPTOMS THAT SHOULD BE REPORTED IMMEDIATELY: *FEVER GREATER THAN 100.4 F (38 C) OR HIGHER *CHILLS OR SWEATING *NAUSEA AND VOMITING THAT IS NOT CONTROLLED WITH YOUR NAUSEA MEDICATION *UNUSUAL SHORTNESS OF BREATH *UNUSUAL BRUISING OR BLEEDING *URINARY PROBLEMS (pain or burning when urinating, or frequent urination) *BOWEL PROBLEMS (unusual diarrhea, constipation, pain near the anus) TENDERNESS IN MOUTH AND THROAT WITH OR WITHOUT PRESENCE OF ULCERS (sore throat, sores in mouth, or a toothache) UNUSUAL RASH, SWELLING OR PAIN  UNUSUAL VAGINAL DISCHARGE OR ITCHING   Items with * indicate a potential emergency and should be followed up as soon as possible or go to the Emergency Department if any problems should occur.  Please show the CHEMOTHERAPY ALERT CARD or IMMUNOTHERAPY ALERT CARD at check-in to the  Emergency Department and triage nurse.  Should you have questions after your visit or need to cancel or reschedule your appointment, please contact [facility] [phone]  and follow the prompts.  Office hours are 8:00 a.m. to 4:30 p.m. Monday - Friday. Please note that voicemails left after 4:00 p.m. may not be returned until the following business day.  We are closed weekends and major holidays. You have access to a nurse at all times for urgent questions. Please call the main number to the clinic [phone] and follow the prompts.  For any non-urgent questions, you may also contact your provider using MyChart. We now offer e-Visits for anyone 44 and older to request care online for non-urgent symptoms. For details visit [email or web address].   Also download the MyChart app! Go to the app store, search "MyChart", open the app, select [facility], and log in with your MyChart username and password.

## 2022-05-31 NOTE — Progress Notes (Signed)
Patient presents today for Decitabine infusion per providers order.  Vital signs within parameters for treatment.  Patient has no new complaints at this time.  Treatment given today per MD orders.  Stable during infusion without adverse affects.  Vital signs stable.  No complaints at this time.  Discharge from clinic ambulatory in stable condition.  Alert and oriented X 3.  Follow up with [facility] as scheduled.

## 2022-06-01 ENCOUNTER — Other Ambulatory Visit (HOSPITAL_COMMUNITY): Payer: Self-pay | Admitting: Hematology

## 2022-06-01 ENCOUNTER — Inpatient Hospital Stay: Payer: Medicare Other

## 2022-06-01 VITALS — BP 145/89 | HR 75 | Temp 98.6°F | Resp 16

## 2022-06-01 DIAGNOSIS — C9200 Acute myeloblastic leukemia, not having achieved remission: Secondary | ICD-10-CM

## 2022-06-01 DIAGNOSIS — Z95828 Presence of other vascular implants and grafts: Secondary | ICD-10-CM

## 2022-06-01 DIAGNOSIS — D46Z Other myelodysplastic syndromes: Secondary | ICD-10-CM

## 2022-06-01 DIAGNOSIS — Z5111 Encounter for antineoplastic chemotherapy: Secondary | ICD-10-CM | POA: Diagnosis not present

## 2022-06-01 MED ORDER — HEPARIN SOD (PORK) LOCK FLUSH 100 UNIT/ML IV SOLN
500.0000 [IU] | Freq: Once | INTRAVENOUS | Status: AC | PRN
  Administered 2022-06-01: 500 [IU]

## 2022-06-01 MED ORDER — SODIUM CHLORIDE 0.9% FLUSH
10.0000 mL | INTRAVENOUS | Status: DC | PRN
  Administered 2022-06-01: 10 mL

## 2022-06-01 MED ORDER — ONDANSETRON HCL 4 MG/2ML IJ SOLN
4.0000 mg | Freq: Once | INTRAMUSCULAR | Status: AC
  Administered 2022-06-01: 4 mg via INTRAVENOUS
  Filled 2022-06-01: qty 2

## 2022-06-01 MED ORDER — SODIUM CHLORIDE 0.9 % IV SOLN
15.0000 mg/m2 | Freq: Once | INTRAVENOUS | Status: AC
  Administered 2022-06-01: 35 mg via INTRAVENOUS
  Filled 2022-06-01: qty 7

## 2022-06-01 MED ORDER — SODIUM CHLORIDE 0.9 % IV SOLN: Freq: Once | INTRAVENOUS | Status: AC

## 2022-06-01 NOTE — Progress Notes (Signed)
Treatment given per orders. Patient tolerated it well without problems. Vitals stable and discharged home from clinic via wheelchair Follow up as scheduled.  

## 2022-06-01 NOTE — Patient Instructions (Signed)
[facility]  Discharge Instructions: Thank you for choosing [facility] to provide your oncology and hematology care.  If you have a lab appointment with the [facility], please come in thru the Main Entrance and check in at the main information desk.  Wear comfortable clothing and clothing appropriate for easy access to any Portacath or PICC line.   We strive to give you quality time with your provider. You may need to reschedule your appointment if you arrive late (15 or more minutes).  Arriving late affects you and other patients whose appointments are after yours.  Also, if you miss three or more appointments without notifying the office, you may be dismissed from the clinic at the provider's discretion.      For prescription refill requests, have your pharmacy contact our office and allow 72 hours for refills to be completed.    Today you received the following chemotherapy and/or immunotherapy agents, vidaza   To help prevent nausea and vomiting after your treatment, we encourage you to take your nausea medication as directed.  BELOW ARE SYMPTOMS THAT SHOULD BE REPORTED IMMEDIATELY: *FEVER GREATER THAN 100.4 F (38 C) OR HIGHER *CHILLS OR SWEATING *NAUSEA AND VOMITING THAT IS NOT CONTROLLED WITH YOUR NAUSEA MEDICATION *UNUSUAL SHORTNESS OF BREATH *UNUSUAL BRUISING OR BLEEDING *URINARY PROBLEMS (pain or burning when urinating, or frequent urination) *BOWEL PROBLEMS (unusual diarrhea, constipation, pain near the anus) TENDERNESS IN MOUTH AND THROAT WITH OR WITHOUT PRESENCE OF ULCERS (sore throat, sores in mouth, or a toothache) UNUSUAL RASH, SWELLING OR PAIN  UNUSUAL VAGINAL DISCHARGE OR ITCHING   Items with * indicate a potential emergency and should be followed up as soon as possible or go to the Emergency Department if any problems should occur.  Please show the CHEMOTHERAPY ALERT CARD or IMMUNOTHERAPY ALERT CARD at check-in to the Emergency  Department and triage nurse.  Should you have questions after your visit or need to cancel or reschedule your appointment, please contact [facility] [phone]  and follow the prompts.  Office hours are 8:00 a.m. to 4:30 p.m. Monday - Friday. Please note that voicemails left after 4:00 p.m. may not be returned until the following business day.  We are closed weekends and major holidays. You have access to a nurse at all times for urgent questions. Please call the main number to the clinic [phone] and follow the prompts.  For any non-urgent questions, you may also contact your provider using MyChart. We now offer e-Visits for anyone 43 and older to request care online for non-urgent symptoms. For details visit [email or web address].   Also download the MyChart app! Go to the app store, search "MyChart", open the app, select [facility], and log in with your MyChart username and password.

## 2022-06-02 ENCOUNTER — Inpatient Hospital Stay: Payer: Medicare Other

## 2022-06-02 VITALS — BP 125/74 | HR 83 | Temp 97.8°F | Resp 18

## 2022-06-02 DIAGNOSIS — Z95828 Presence of other vascular implants and grafts: Secondary | ICD-10-CM

## 2022-06-02 DIAGNOSIS — C9200 Acute myeloblastic leukemia, not having achieved remission: Secondary | ICD-10-CM

## 2022-06-02 DIAGNOSIS — D46Z Other myelodysplastic syndromes: Secondary | ICD-10-CM

## 2022-06-02 DIAGNOSIS — Z5111 Encounter for antineoplastic chemotherapy: Secondary | ICD-10-CM | POA: Diagnosis not present

## 2022-06-02 MED ORDER — SODIUM CHLORIDE 0.9 % IV SOLN: Freq: Once | INTRAVENOUS | Status: AC

## 2022-06-02 MED ORDER — SODIUM CHLORIDE 0.9 % IV SOLN
15.0000 mg/m2 | Freq: Once | INTRAVENOUS | Status: AC
  Administered 2022-06-02: 35 mg via INTRAVENOUS
  Filled 2022-06-02: qty 7

## 2022-06-02 MED ORDER — ONDANSETRON HCL 4 MG/2ML IJ SOLN
4.0000 mg | Freq: Once | INTRAMUSCULAR | Status: AC
  Administered 2022-06-02: 4 mg via INTRAVENOUS
  Filled 2022-06-02: qty 2

## 2022-06-02 MED ORDER — SODIUM CHLORIDE 0.9% FLUSH
10.0000 mL | INTRAVENOUS | Status: DC | PRN
  Administered 2022-06-02: 10 mL

## 2022-06-02 MED ORDER — HEPARIN SOD (PORK) LOCK FLUSH 100 UNIT/ML IV SOLN
500.0000 [IU] | Freq: Once | INTRAVENOUS | Status: AC | PRN
  Administered 2022-06-02: 500 [IU]

## 2022-06-02 NOTE — Patient Instructions (Signed)
[facility]  Discharge Instructions: Thank you for choosing [facility] to provide your oncology and hematology care.  If you have a lab appointment with the [facility], please come in thru the Main Entrance and check in at the main information desk.  Wear comfortable clothing and clothing appropriate for easy access to any Portacath or PICC line.   We strive to give you quality time with your provider. You may need to reschedule your appointment if you arrive late (15 or more minutes).  Arriving late affects you and other patients whose appointments are after yours.  Also, if you miss three or more appointments without notifying the office, you may be dismissed from the clinic at the provider's discretion.      For prescription refill requests, have your pharmacy contact our office and allow 72 hours for refills to be completed.    Today you received the following chemotherapy and/or immunotherapy agents decitabine       To help prevent nausea and vomiting after your treatment, we encourage you to take your nausea medication as directed.  BELOW ARE SYMPTOMS THAT SHOULD BE REPORTED IMMEDIATELY: *FEVER GREATER THAN 100.4 F (38 C) OR HIGHER *CHILLS OR SWEATING *NAUSEA AND VOMITING THAT IS NOT CONTROLLED WITH YOUR NAUSEA MEDICATION *UNUSUAL SHORTNESS OF BREATH *UNUSUAL BRUISING OR BLEEDING *URINARY PROBLEMS (pain or burning when urinating, or frequent urination) *BOWEL PROBLEMS (unusual diarrhea, constipation, pain near the anus) TENDERNESS IN MOUTH AND THROAT WITH OR WITHOUT PRESENCE OF ULCERS (sore throat, sores in mouth, or a toothache) UNUSUAL RASH, SWELLING OR PAIN  UNUSUAL VAGINAL DISCHARGE OR ITCHING   Items with * indicate a potential emergency and should be followed up as soon as possible or go to the Emergency Department if any problems should occur.  Please show the CHEMOTHERAPY ALERT CARD or IMMUNOTHERAPY ALERT CARD at check-in to the  Emergency Department and triage nurse.  Should you have questions after your visit or need to cancel or reschedule your appointment, please contact [facility] [phone]  and follow the prompts.  Office hours are 8:00 a.m. to 4:30 p.m. Monday - Friday. Please note that voicemails left after 4:00 p.m. may not be returned until the following business day.  We are closed weekends and major holidays. You have access to a nurse at all times for urgent questions. Please call the main number to the clinic [phone] and follow the prompts.  For any non-urgent questions, you may also contact your provider using MyChart. We now offer e-Visits for anyone 64 and older to request care online for non-urgent symptoms. For details visit [email or web address].   Also download the MyChart app! Go to the app store, search "MyChart", open the app, select [facility], and log in with your MyChart username and password.

## 2022-06-02 NOTE — Progress Notes (Signed)
Patient presents today for Decitabine infusion per providers order.  Vital signs within parameters for treatment.  Patient has no new complaints at this time.  Treatment given today per MD orders.  Stable during infusion without adverse affects.  Vital signs stable.  No complaints at this time.  Discharge from clinic ambulatory in stable condition.  Alert and oriented X 3.  Follow up with [facility] as scheduled.

## 2022-06-05 ENCOUNTER — Encounter: Payer: Self-pay | Admitting: Hematology

## 2022-06-05 ENCOUNTER — Telehealth: Payer: Self-pay | Admitting: Pharmacy Technician

## 2022-06-05 ENCOUNTER — Encounter (HOSPITAL_COMMUNITY): Payer: Self-pay | Admitting: Hematology

## 2022-06-05 ENCOUNTER — Telehealth: Payer: Self-pay | Admitting: *Deleted

## 2022-06-05 ENCOUNTER — Other Ambulatory Visit (HOSPITAL_COMMUNITY): Payer: Self-pay

## 2022-06-05 DIAGNOSIS — G6289 Other specified polyneuropathies: Secondary | ICD-10-CM

## 2022-06-05 DIAGNOSIS — F0390 Unspecified dementia without behavioral disturbance: Secondary | ICD-10-CM

## 2022-06-05 DIAGNOSIS — D46Z Other myelodysplastic syndromes: Secondary | ICD-10-CM

## 2022-06-05 NOTE — Telephone Encounter (Signed)
Oral Oncology Patient Advocate Encounter  Was successful in securing patient a $10,000 grant from [employer] to provide copayment coverage for [facility].  This will keep the out of pocket expense at $0.     [employer] ID: [id number]   The billing information is as follows and has been shared with [facility].    RxBin: [id number] PCN: [id number] Member ID: [id number] Group ID: [id number] Dates of Eligibility: 05/10/22 through 05/10/23  Fund:  Gower, CPhT-Adv Oncology Pharmacy Patient [place] Direct Number: [phone]  Fax: [phone]
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
## 2022-06-07 ENCOUNTER — Other Ambulatory Visit: Payer: Self-pay

## 2022-06-09 ENCOUNTER — Other Ambulatory Visit (HOSPITAL_COMMUNITY): Payer: Self-pay

## 2022-06-25 ENCOUNTER — Other Ambulatory Visit: Payer: Self-pay

## 2022-06-28 ENCOUNTER — Other Ambulatory Visit (HOSPITAL_COMMUNITY): Payer: Self-pay

## 2022-07-03 ENCOUNTER — Other Ambulatory Visit: Payer: Self-pay

## 2022-07-03 ENCOUNTER — Other Ambulatory Visit: Payer: Self-pay | Admitting: Hematology

## 2022-07-03 ENCOUNTER — Inpatient Hospital Stay: Payer: Medicare Other

## 2022-07-03 ENCOUNTER — Inpatient Hospital Stay: Payer: Medicare Other | Admitting: Hematology

## 2022-07-03 DIAGNOSIS — C9200 Acute myeloblastic leukemia, not having achieved remission: Secondary | ICD-10-CM

## 2022-07-04 ENCOUNTER — Inpatient Hospital Stay: Payer: Medicare Other

## 2022-07-05 ENCOUNTER — Inpatient Hospital Stay: Payer: Medicare Other

## 2022-07-05 ENCOUNTER — Ambulatory Visit (INDEPENDENT_AMBULATORY_CARE_PROVIDER_SITE_OTHER): Payer: Medicare Other | Admitting: Urology

## 2022-07-05 VITALS — BP 148/81 | HR 65

## 2022-07-05 DIAGNOSIS — N138 Other obstructive and reflux uropathy: Secondary | ICD-10-CM | POA: Diagnosis not present

## 2022-07-05 DIAGNOSIS — R350 Frequency of micturition: Secondary | ICD-10-CM | POA: Diagnosis not present

## 2022-07-05 DIAGNOSIS — R339 Retention of urine, unspecified: Secondary | ICD-10-CM

## 2022-07-05 DIAGNOSIS — N401 Enlarged prostate with lower urinary tract symptoms: Secondary | ICD-10-CM | POA: Diagnosis not present

## 2022-07-05 DIAGNOSIS — N200 Calculus of kidney: Secondary | ICD-10-CM

## 2022-07-05 MED ORDER — CIPROFLOXACIN HCL 500 MG PO TABS
500.0000 mg | ORAL_TABLET | Freq: Once | ORAL | Status: AC
  Administered 2022-07-05: 500 mg via ORAL

## 2022-07-05 MED ORDER — SILODOSIN 8 MG PO CAPS: 8.0000 mg | ORAL_CAPSULE | Freq: Every day | ORAL | 11 refills | Status: DC

## 2022-07-05 NOTE — Progress Notes (Unsigned)
post void residual=58

## 2022-07-05 NOTE — Progress Notes (Unsigned)
07/05/2022 1:44 PM   Richard Wallace 18-Mar-1946 [id number]  Referring provider: No referring provider defined for this encounter.  No chief complaint on file.   HPI:    PMH: 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   Cancer ([place]) 01-28-2014   skin cancer   CKD (chronic kidney disease), stage III (HCC)    GERD (gastroesophageal reflux disease)    Heart murmur    NOTED DURING PHYSICAL WHEN HE WAS ENLISTING IN [facility] , DIDNT KNOW UNTIL THAT TIME AND REPORTS , "THATS THE LAST I HEARD ABOUT IT "    History of hypertension    no longer issue   History of kidney stones    History of malignant melanoma of skin    excision top of scalp 2015-- no recurrence   History of urinary retention    post op lumbar fusion surgery 04/ 2016   Hypertension    Kidney dysfunction    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, MONITORED BY [facility] DR Annie Main DAHLSTEDT    Left ureteral calculus    Seasonal allergies    Wears glasses    Wears glasses    Wears partial dentures    upper and lower    Surgical History: Past Surgical History:  Procedure Laterality Date   ANKLE FUSION Right 2007   CARPAL TUNNEL RELEASE Left 12/28/2009   w/ pulley release left long finger   CARPAL TUNNEL RELEASE Right 07/22/2013   Procedure: RIGHT CARPAL TUNNEL RELEASE;  Surgeon: Cammie Sickle., MD;  Location: [place];  Service: Orthopedics;  Laterality: Right;   COLONOSCOPY     CYSTO/  LEFT RETROGRADE PYELOGRAM  11/21/2010   CYSTOSCOPY WITH STENT PLACEMENT Left 03/09/2016   Procedure: CYSTOSCOPY WITH STENT PLACEMENT;  Surgeon: Franchot Gallo, MD;  Location: [facility];  Service: Urology;  Laterality: Left;   CYSTOSCOPY/RETROGRADE/URETEROSCOPY/STONE EXTRACTION WITH BASKET Left 03/09/2016   Procedure: CYSTOSCOPY/RETROGRADE/URETEROSCOPY/STONE EXTRACTION WITH BASKET;  Surgeon: Franchot Gallo, MD;  Location: [facility];  Service: Urology;  Laterality: Left;   LEFT URETEROSCOPIC LASER LITHOTRIPSY STONE EXTRACTION/ STENT PLACEMENT  05/23/2010   MOHS SURGERY     TOP OF THE HEAD    ORIF ANKLE FRACTURE Right 1978   PORT-A-CATH REMOVAL Right 02/14/2019   Procedure: MINOR REMOVAL PORT-A-CATH;  Surgeon: Aviva Signs, MD;  Location: [facility];  Service: General;  Laterality: Right;   PORTACATH PLACEMENT Right 08/19/2018   Procedure: INSERTION PORT-A-CATH (attached catheter in right subclavian);  Surgeon: Aviva Signs, MD;  Location: [facility];  Service: General;  Laterality: Right;   PORTACATH PLACEMENT Left 05/16/2019   Procedure: INSERTION PORT-A-CATH (attached catheter in left subclavian);  Surgeon: Aviva Signs, MD;  Location: [facility];  Service: General;  Laterality: Left;   POSTERIOR LUMBAR FUSION  08/21/2014   laminectomy and decompression L2 -- L5   RIGHT LOWER LEG SURGERY  X3  1975 to 1976   including ORIF   TONSILLECTOMY AND ADENOIDECTOMY  [id number]   UMBILICAL HERNIA REPAIR  2009 approx    Home Medications:  Allergies as of 07/05/2022   No Known Allergies      Medication List        Accurate as of July 05, 2022  1:44 PM. If you have any questions, ask your nurse or doctor.          Acetaminophen Extra Strength 500 MG Caps   allopurinol  300 MG tablet Commonly known as: ZYLOPRIM Take 300 mg by mouth daily.   ALPRAZolam 0.25 MG tablet Commonly known as: XANAX Take 1 tablet by mouth twice daily as needed for anxiety   amLODipine 2.5 MG tablet Commonly known as: NORVASC Take 1 tablet by mouth once daily   benzonatate 200 MG capsule Commonly known as: TESSALON Take 1 capsule 3 times a day by oral route as needed.   cetirizine 10 MG tablet Commonly known as: ZYRTEC Take 10 mg by mouth daily. IN THE MORNING   diclofenac Sodium 1 % Gel Commonly known as: VOLTAREN Apply three times daily to knees as needed for pain   docusate sodium 100 MG capsule Commonly known as:  COLACE Take 100 mg by mouth at bedtime.   donepezil 10 MG tablet Commonly known as: ARICEPT TAKE 1 TABLET BY MOUTH AT BEDTIME   furosemide 20 MG tablet Commonly known as: LASIX Take 1 tablet (20 mg total) by mouth daily as needed.   guaiFENesin-dextromethorphan 100-10 MG/5ML syrup Commonly known as: ROBITUSSIN DM Take 5 mLs by mouth every 4 (four) hours as needed for cough.   HYDROcodone-acetaminophen 10-325 MG tablet Commonly known as: NORCO Take 1 tablet by mouth every 6 (six) hours as needed.   ipratropium 0.03 % nasal spray Commonly known as: ATROVENT Place 2 sprays into both nostrils 3 (three) times daily.   lansoprazole 15 MG capsule Commonly known as: PREVACID Take 15 mg by mouth daily.   lidocaine-prilocaine cream Commonly known as: EMLA APPLY CREAM TOPICALLY ONE HOUR PRIOR TO CHEMOTHERAPY APPOINTMENT   magnesium oxide 400 (240 Mg) MG tablet Commonly known as: MAG-OX TAKE 1 TABLET BY MOUTH THREE TIMES DAILY   oxyCODONE 5 MG immediate release tablet Commonly known as: Oxy IR/ROXICODONE Take 1-2 tablets (5-10 mg total) by mouth every 6 (six) hours as needed.   SF 5000 Plus 1.1 % Crea dental cream Generic drug: sodium fluoride SMARTSIG:Sparingly By Mouth Every Night   tamsulosin 0.4 MG Caps capsule Commonly known as: FLOMAX Take 1 capsule by mouth twice daily   tiZANidine 2 MG tablet Commonly known as: ZANAFLEX 4 mg. Takes 2 q 8 hrs prn   Venclexta 100 MG tablet Generic drug: venetoclax TAKE 2 TABLETS (200 MG) BY MOUTH DAILY        Allergies: No Known Allergies  Family History: Family History  Problem Relation Age of Onset   Stroke Mother    Prostate cancer Father    Bone cancer Father    Diverticulitis Father    Rheum arthritis Sister    Urinary tract infection Sister    Colon cancer Neg Hx     Social History:  reports that he quit smoking about 35 years ago. His smoking use included cigarettes. He has never used smokeless tobacco. He  reports current alcohol use of about 7.0 - 14.0 standard drinks of alcohol per week. He reports that he does not use drugs.  ROS: All other review of systems were reviewed and are negative except what is noted above in HPI  Physical Exam: BP (!) 148/81   Pulse 65   Constitutional:  Alert and oriented, No acute distress. HEENT: [place] AT, moist mucus membranes.  Trachea midline, no masses. Cardiovascular: No clubbing, cyanosis, or edema. Respiratory: Normal respiratory effort, no increased work of breathing. GI: Abdomen is soft, nontender, nondistended, no abdominal masses GU: No CVA tenderness.  Lymph: No cervical or inguinal lymphadenopathy. Skin: No rashes, bruises or suspicious lesions. Neurologic: Grossly intact, no  focal deficits, moving all 4 extremities. Psychiatric: Normal mood and affect.  Laboratory Data: Lab Results  Component Value Date   WBC 8.7 05/29/2022   HGB 16.4 05/29/2022   HCT 49.9 05/29/2022   MCV 92.1 05/29/2022   PLT 142 (L) 05/29/2022    Lab Results  Component Value Date   CREATININE 1.02 05/29/2022    No results found for: "PSA"  No results found for: "TESTOSTERONE"  No results found for: "HGBA1C"  Urinalysis    Component Value Date/Time   COLORURINE YELLOW 04/25/2022 1210   APPEARANCEUR CLEAR 04/25/2022 1210   LABSPEC 1.027 04/25/2022 1210   PHURINE 5.0 04/25/2022 1210   GLUCOSEU NEGATIVE 04/25/2022 1210   HGBUR NEGATIVE 04/25/2022 1210   BILIRUBINUR NEGATIVE 04/25/2022 1210   KETONESUR NEGATIVE 04/25/2022 1210   PROTEINUR NEGATIVE 04/25/2022 1210   UROBILINOGEN 1.0 08/24/2014 1630   NITRITE NEGATIVE 04/25/2022 1210   LEUKOCYTESUR SMALL (A) 04/25/2022 1210    Lab Results  Component Value Date   BACTERIA NONE SEEN 04/25/2022    Pertinent Imaging: *** Results for orders placed during the hospital encounter of 11/19/12  DG Abd 1 View  Narrative *RADIOLOGY REPORT*  Clinical Data: History of kidney stones.  No pain  now.  ABDOMEN - 2 VIEW  Comparison: 10/07/2010.  Findings: Moderate stool overlies the expected location of the renal outlines.  There is a 2 mm radiopaque density just to the left of the L2-L3 disc space which could represent a small calculus.  No definite distal ureteral or bladder calculi. Degenerative changes lumbar spine and pelvis.  Similar appearance to 2012.  IMPRESSION: Cannot exclude a 2 mm calculus left proximal ureter.   Original Report Authenticated By: Rolla Flatten, M.D.  No results found for this or any previous visit.  No results found for this or any previous visit.  No results found for this or any previous visit.  No results found for this or any previous visit.  No valid procedures specified. No results found for this or any previous visit.  Results for orders placed during the hospital encounter of 04/25/22  CT Renal Stone Study  Narrative CLINICAL DATA:  Difficulty urinating. 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  EXAM: CT ABDOMEN AND PELVIS WITHOUT CONTRAST  TECHNIQUE: Multidetector CT imaging of the abdomen and pelvis was performed following the standard protocol without IV contrast.  RADIATION DOSE REDUCTION: This exam was performed according to the departmental dose-optimization program which includes automated exposure control, adjustment of the mA and/or kV according to patient size and/or use of iterative reconstruction technique.  COMPARISON:  Abdominal ultrasound dated January 23, 2022. CT abdomen pelvis dated February 12, 2019.  FINDINGS: Lower chest: No acute abnormality. New mild bibasilar bronchiectasis and increased subpleural reticulation.  Hepatobiliary: Unchanged mildly decreased liver density without focal abnormality. The gallbladder is unremarkable. No biliary dilatation.  Pancreas: Unremarkable. No pancreatic ductal dilatation or surrounding inflammatory changes.  Spleen: Normal in size without focal  abnormality.  Adrenals/Urinary Tract: The adrenal glands are unremarkable. Unchanged left renal cortical thinning and atrophy. Unchanged 7 mm calculus in the lower pole of the right kidney. New punctate calculus in the midpole of the right kidney. Bilateral renal simple cysts of slightly increased in size. No follow-up imaging is recommended. No hydronephrosis. Unchanged mild asymmetric left bladder wall thickening (series 2, image 83).  Stomach/Bowel: The stomach is within normal limits. Unchanged duodenal diverticulum. No bowel wall thickening, distention, or surrounding inflammatory changes. Normal appendix. Moderate left colonic diverticulosis.  Vascular/Lymphatic: Aortic  atherosclerosis. No enlarged abdominal or pelvic lymph nodes.  Reproductive: Unchanged mild prostatomegaly.  Other: No abdominal wall hernia or abnormality. No abdominopelvic ascites. No pneumoperitoneum.  Musculoskeletal: No acute or significant osseous findings. Prior lumbar fusion.  IMPRESSION: 1. No acute intra-abdominal process. 2. Chronic and unchanged mild asymmetric left bladder wall thickening, potentially related to chronic bladder outlet obstruction. 3. Unchanged 7 mm calculus in the lower pole of the right kidney. New punctate calculus in the midpole of the right kidney. No hydronephrosis. 4. New mild bibasilar bronchiectasis and increased subpleural reticulation, concerning for interstitial lung disease. 5.  Aortic Atherosclerosis (ICD10-I70.0).   Electronically Signed By: Titus Dubin M.D. On: 04/25/2022 13:17   Assessment & Plan:    1. Urinary frequency *** - Urinalysis, Routine w reflex microscopic - BLADDER SCAN AMB NON-IMAGING  2. Nephrolithiasis ***  3. Benign prostatic hyperplasia with urinary obstruction ***   No follow-ups on file.  Nicolette Bang, MD  [facility] [place]    07/05/22  CC: No chief complaint on file.   HPI:  Blood  pressure (!) 148/81, pulse 65. NED. A&Ox3.   No respiratory distress   Abd soft, NT, ND Normal phallus with bilateral descended testicles  Cystoscopy Procedure Note  Patient identification was confirmed, informed consent was obtained, and patient was prepped using Betadine solution.  Lidocaine jelly was administered per urethral meatus.     Pre-Procedure: - Inspection reveals a normal caliber ureteral meatus.  Procedure: The flexible cystoscope was introduced without difficulty - No urethral strictures/lesions are present. - {Blank multiple:19197::"Enlarged","Surgically absent","Normal"} prostate *** - {Blank multiple:19197::"Normal","Elevated","Tight"} bladder neck - Bilateral ureteral orifices identified - Bladder mucosa  reveals no ulcers, tumors, or lesions - No bladder stones - No trabeculation  Retroflexion shows ***   Post-Procedure: - Patient tolerated the procedure well  Assessment/ Plan:   No follow-ups on file.  Nicolette Bang, MD

## 2022-07-06 ENCOUNTER — Inpatient Hospital Stay: Payer: Medicare Other

## 2022-07-06 ENCOUNTER — Other Ambulatory Visit: Payer: Self-pay

## 2022-07-06 ENCOUNTER — Encounter: Payer: Self-pay | Admitting: Urology

## 2022-07-06 LAB — MICROSCOPIC EXAMINATION: Bacteria, UA: NONE SEEN

## 2022-07-06 LAB — URINALYSIS, ROUTINE W REFLEX MICROSCOPIC
Bilirubin, UA: NEGATIVE
Glucose, UA: NEGATIVE
Nitrite, UA: NEGATIVE
RBC, UA: NEGATIVE
Specific Gravity, UA: 1.025 (ref 1.005–1.030)
Urobilinogen, Ur: 1 mg/dL (ref 0.2–1.0)
pH, UA: 5.5 (ref 5.0–7.5)

## 2022-07-06 NOTE — Patient Instructions (Signed)

## 2022-07-07 ENCOUNTER — Inpatient Hospital Stay: Payer: Medicare Other

## 2022-07-09 NOTE — Progress Notes (Signed)
[facility] [address], [place] [address]    Clinic Day:  07/10/2022  Referring physician: No ref. provider found  Patient Care Team: Pcp, No as PCP - General Richard Jack, MD as Medical Oncologist (Medical Oncology)   ASSESSMENT & PLAN:   Assessment: 1.  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  -BMBX on 02/25/2019 after cycle 1 did not show any evidence of leukemia. -CT CAP on 02/12/2019 shows numerous bilateral irregular/spiculated pulmonary nodules measuring up to 14 mm, nonspecific.  Hepatic steatosis.  Spleen is normal. -I have talked to Dr.Ravandi at [facility] per patient request.  There are clinical trials available upon progression of his AML.  There are also some clinical trials available now based on MRD positivity.  Patient not interested in moving to [place] at this time.  Plan: 1.  AML: - He has tolerated last cycle of decitabine and venetoclax very well. - No infections reported.  Reviewed labs today which showed normal LFTs.  CBC was grossly normal with normal differential. - Proceed with next cycle of decitabine today.  He will start venetoclax 200 mg and takes for 2 weeks and stops. - RTC 6 weeks for follow-up.   2.  Arthralgias: - Continue hydrocodone 10/325 every 6 hours as needed.   3.  Hypomagnesemia: - Continue magnesium 3 times daily.  Magnesium is normal.   4.  Hypertension: - Continue amlodipine 2.5 mg daily.  Blood pressure is normal.   5.  Dementia: - Continue Aricept 10 mg daily.  6.  Right leg swelling: - Continue Lasix 20 mg daily as needed.  No orders of the defined types were placed in this encounter.     I,Alexis Herring,acting as a Education administrator for [employer], MD.,have documented all relevant documentation on the behalf of Richard Jack, MD,as directed by  Richard Jack, MD while in the presence  of Richard Jack, MD.   I, Richard Jack MD, have reviewed the above documentation for accuracy and completeness, and I agree with the above.   Richard Jack, MD   3/4/20246:15 PM  CHIEF COMPLAINT:   Diagnosis: AML    Cancer Staging  No matching staging information was found for the patient.   Prior Therapy: None  Current Therapy: Decitabine every 6 weeks & venetoclax 200 mg x2 weeks after chemo    HISTORY OF PRESENT ILLNESS:   Oncology History  MDS (myelodysplastic syndrome), high grade (HCC)  08/14/2018 Initial Diagnosis   MDS (myelodysplastic syndrome), high grade ([place])   08/22/2018 - 12/06/2018 Chemotherapy   The patient had palonosetron (ALOXI) injection 0.25 mg, 0.25 mg, Intravenous,  Once, 4 of 6 cycles Administration: 0.25 mg (08/22/2018), 0.25 mg (08/26/2018), 0.25 mg (08/28/2018), 0.25 mg (08/30/2018), 0.25 mg (10/01/2018), 0.25 mg (10/02/2018), 0.25 mg (11/04/2018), 0.25 mg (09/23/2018), 0.25 mg (09/25/2018), 0.25 mg (09/27/2018), 0.25 mg (10/28/2018), 0.25 mg (10/30/2018), 0.25 mg (11/01/2018), 0.25 mg (12/02/2018), 0.25 mg (12/04/2018), 0.25 mg (12/06/2018) azaCITIDine (VIDAZA) 100 mg in sodium chloride 0.9 % 50 mL chemo infusion, 110 mg (66.7 % of original dose 75 mg/m2), Intravenous, Once, 4 of 6 cycles Dose modification: 50 mg/m2 (original dose 75 mg/m2, Cycle 1, Reason: Provider Judgment), 50 mg/m2 (original dose 75 mg/m2, Cycle 2, Reason: Provider Judgment) Administration: 100 mg (08/22/2018), 100 mg (08/23/2018), 100 mg (08/26/2018), 100 mg (08/27/2018), 100 mg (08/28/2018), 100 mg (08/29/2018), 100 mg (08/30/2018), 100 mg (10/01/2018), 100 mg (10/02/2018),  100 mg (11/04/2018), 100 mg (11/05/2018), 100 mg (09/23/2018), 100 mg (09/24/2018), 100 mg (09/25/2018), 100 mg (09/26/2018), 100 mg (09/27/2018), 100 mg (10/28/2018), 100 mg (10/29/2018), 100 mg (10/30/2018), 100 mg (10/31/2018), 100 mg (11/01/2018), 100 mg (12/02/2018), 100 mg (12/03/2018), 100 mg (12/04/2018), 100 mg (12/05/2018), 100  mg (12/06/2018)  for chemotherapy treatment.    01/20/2019 - 12/02/2021 Chemotherapy   Patient is on Treatment Plan : MYELODYSPLASIA Decitabine D1-5 q42d     01/20/2019 -  Chemotherapy   Patient is on Treatment Plan : AML Decitabine D1-5 q 42d     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[place])  01/07/2019 Initial Diagnosis   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[place])   01/20/2019 - 12/02/2021 Chemotherapy   Patient is on Treatment Plan : MYELODYSPLASIA Decitabine D1-5 q42d     01/20/2019 -  Chemotherapy   Patient is on Treatment Plan : AML Decitabine D1-5 q 42d        INTERVAL HISTORY:   Richard Wallace is a 77 y.o. male presenting to clinic today for follow up of AML. He was last seen by me on 05/29/22.  Today, he states that he is doing well overall. His appetite level is at 75%. His energy level is at 40%.  He denied any fevers or infections after last treatment.  No GI side effects noted.   PAST MEDICAL HISTORY:   Past Medical History: 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   Cancer ([place]) 01-28-2014   skin cancer   CKD (chronic kidney disease), stage III (HCC)    GERD (gastroesophageal reflux disease)    Heart murmur    NOTED DURING PHYSICAL WHEN HE WAS ENLISTING IN [facility] , DIDNT KNOW UNTIL THAT TIME AND REPORTS , "THATS THE LAST I HEARD ABOUT IT "    History of hypertension    no longer issue   History of kidney stones    History of malignant melanoma of skin    excision top of scalp 2015-- no recurrence   History of urinary retention    post op lumbar fusion surgery 04/ 2016   Hypertension    Kidney dysfunction    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, MONITORED BY [facility] DR Annie Main DAHLSTEDT    Left ureteral calculus    Seasonal allergies    Wears glasses    Wears glasses    Wears partial dentures    upper and lower    Surgical History: Past Surgical History:  Procedure Laterality Date   ANKLE FUSION Right 2007    CARPAL TUNNEL RELEASE Left 12/28/2009   w/ pulley release left long finger   CARPAL TUNNEL RELEASE Right 07/22/2013   Procedure: RIGHT CARPAL TUNNEL RELEASE;  Surgeon: Cammie Sickle., MD;  Location: [facility];  Service: Orthopedics;  Laterality: Right;   COLONOSCOPY     CYSTO/  LEFT RETROGRADE PYELOGRAM  11/21/2010   CYSTOSCOPY WITH STENT PLACEMENT Left 03/09/2016   Procedure: CYSTOSCOPY WITH STENT PLACEMENT;  Surgeon: Franchot Gallo, MD;  Location: [facility];  Service: Urology;  Laterality: Left;   CYSTOSCOPY/RETROGRADE/URETEROSCOPY/STONE EXTRACTION WITH BASKET Left 03/09/2016   Procedure: CYSTOSCOPY/RETROGRADE/URETEROSCOPY/STONE EXTRACTION WITH BASKET;  Surgeon: Franchot Gallo, MD;  Location: [facility];  Service: Urology;  Laterality: Left;   LEFT URETEROSCOPIC LASER LITHOTRIPSY STONE EXTRACTION/ STENT PLACEMENT  05/23/2010   MOHS SURGERY     TOP OF THE HEAD    ORIF ANKLE  FRACTURE Right 1978   PORT-A-CATH REMOVAL Right 02/14/2019   Procedure: MINOR REMOVAL PORT-A-CATH;  Surgeon: Aviva Signs, MD;  Location: [facility];  Service: General;  Laterality: Right;   PORTACATH PLACEMENT Right 08/19/2018   Procedure: INSERTION PORT-A-CATH (attached catheter in right subclavian);  Surgeon: Aviva Signs, MD;  Location: [facility];  Service: General;  Laterality: Right;   PORTACATH PLACEMENT Left 05/16/2019   Procedure: INSERTION PORT-A-CATH (attached catheter in left subclavian);  Surgeon: Aviva Signs, MD;  Location: [facility];  Service: General;  Laterality: Left;   POSTERIOR LUMBAR FUSION  08/21/2014   laminectomy and decompression L2 -- L5   RIGHT LOWER LEG SURGERY  X3  1975 to 1976   including ORIF   TONSILLECTOMY AND ADENOIDECTOMY  [id number]   UMBILICAL HERNIA REPAIR  2009 approx    Social History: Social History   Socioeconomic History   Marital status: Married    Spouse name: Not on file   Number of children: Not on file   Years of  education: Not on file   Highest education level: Not on file  Occupational History   Not on file  Tobacco Use   Smoking status: Former    Years: 20.00    Types: Cigarettes    Quit date: 07/17/1986    Years since quitting: 36.0   Smokeless tobacco: Never  Vaping Use   Vaping Use: Never used  Substance and Sexual Activity   Alcohol use: Yes    Alcohol/week: 7.0 - 14.0 standard drinks of alcohol    Types: 7 - 14 Cans of beer per week    Comment: 1 -2 beer daily   Drug use: No   Sexual activity: Not Currently  Other Topics Concern   Not on file  Social History Narrative   Not on file   Social Determinants of Health   Financial Resource Strain: Low Risk  (03/23/2020)   Overall Financial Resource Strain (CARDIA)    Difficulty of Paying Living Expenses: Not hard at all  Food Insecurity: No Food Insecurity (03/23/2020)   Hunger Vital Sign    Worried About Running Out of Food in the Last Year: Never true    Ran Out of Food in the Last Year: Never true  Transportation Needs: No Transportation Needs (03/23/2020)   PRAPARE - Hydrologist (Medical): No    Lack of Transportation (Non-Medical): No  Physical Activity: Unknown (02/17/2019)   Exercise Vital Sign    Days of Exercise per Week: Patient refused    Minutes of Exercise per Session: Patient refused  Stress: No Stress Concern Present (03/23/2020)   [place]    Feeling of Stress : Not at all  Social Connections: Moderately Isolated (03/23/2020)   Social Connection and Isolation Panel [NHANES]    Frequency of Communication with Friends and Family: Twice a week    Frequency of Social Gatherings with Friends and Family: Never    Attends Religious Services: Never    Marine scientist or Organizations: Yes    Attends Music therapist: More than 4 times per year    Marital Status: Married  Human resources officer Violence:  Unknown (02/17/2019)   Humiliation, Afraid, Rape, and Kick questionnaire    Fear of Current or Ex-Partner: Patient refused    Emotionally Abused: Patient refused    Physically Abused: Patient refused    Sexually Abused: Patient refused    Family History: Family History  Problem Relation Age of Onset   Stroke Mother    Prostate cancer Father    Bone cancer Father    Diverticulitis Father    Rheum arthritis Sister    Urinary tract infection Sister    Colon cancer Neg Hx     Current Medications:  Current Outpatient Medications:    ACETAMINOPHEN EXTRA STRENGTH 500 MG capsule, , Disp: , Rfl:    allopurinol (ZYLOPRIM) 300 MG tablet, Take 300 mg by mouth daily., Disp: , Rfl:    amLODipine (NORVASC) 2.5 MG tablet, Take 1 tablet by mouth once daily, Disp: 90 tablet, Rfl: 0   cetirizine (ZYRTEC) 10 MG tablet, Take 10 mg by mouth daily. IN THE MORNING, Disp: , Rfl:    diclofenac Sodium (VOLTAREN) 1 % GEL, Apply three times daily to knees as needed for pain, Disp: 50 g, Rfl: 3   docusate sodium (COLACE) 100 MG capsule, Take 100 mg by mouth at bedtime. , Disp: , Rfl:    donepezil (ARICEPT) 10 MG tablet, TAKE 1 TABLET BY MOUTH AT BEDTIME, Disp: 90 tablet, Rfl: 3   furosemide (LASIX) 20 MG tablet, Take 1 tablet (20 mg total) by mouth daily as needed., Disp: 30 tablet, Rfl: 2   guaiFENesin-dextromethorphan (ROBITUSSIN DM) 100-10 MG/5ML syrup, Take 5 mLs by mouth every 4 (four) hours as needed for cough., Disp: 118 mL, Rfl: 0   HYDROcodone-acetaminophen (NORCO) 10-325 MG tablet, Take 1 tablet by mouth every 6 (six) hours as needed., Disp: , Rfl:    ipratropium (ATROVENT) 0.03 % nasal spray, Place 2 sprays into both nostrils 3 (three) times daily., Disp: 30 mL, Rfl: 6   lansoprazole (PREVACID) 15 MG capsule, Take 15 mg by mouth daily. , Disp: , Rfl:    lidocaine-prilocaine (EMLA) cream, APPLY CREAM TOPICALLY ONE HOUR PRIOR TO CHEMOTHERAPY APPOINTMENT, Disp: 30 g, Rfl: 0   magnesium oxide (MAG-OX)  400 (240 Mg) MG tablet, TAKE 1 TABLET BY MOUTH THREE TIMES DAILY, Disp: 90 tablet, Rfl: 3   oxyCODONE (OXY IR/ROXICODONE) 5 MG immediate release tablet, Take 1-2 tablets (5-10 mg total) by mouth every 6 (six) hours as needed., Disp: 120 tablet, Rfl: 0   SF 5000 PLUS 1.1 % CREA dental cream, SMARTSIG:Sparingly By Mouth Every Night, Disp: , Rfl:    silodosin (RAPAFLO) 8 MG CAPS capsule, Take 1 capsule (8 mg total) by mouth at bedtime., Disp: 30 capsule, Rfl: 11   tiZANidine (ZANAFLEX) 2 MG tablet, 4 mg. Takes 2 q 8 hrs prn, Disp: , Rfl:    venetoclax (VENCLEXTA) 100 MG tablet, TAKE 2 TABLETS (200 MG) BY MOUTH DAILY, Disp: 60 tablet, Rfl: 2   ALPRAZolam (XANAX) 0.25 MG tablet, Take 1 tablet by mouth twice daily as needed for anxiety (Patient not taking: Reported on 07/05/2022), Disp: 60 tablet, Rfl: 0   benzonatate (TESSALON) 200 MG capsule, Take 1 capsule 3 times a day by oral route as needed. (Patient not taking: Reported on 07/05/2022), Disp: , Rfl:  No current facility-administered medications for this visit.  Facility-Administered Medications Ordered in Other Visits:    octreotide (SANDOSTATIN LAR) 30 MG IM injection, , , ,    sodium chloride flush (NS) 0.9 % injection 10 mL, 10 mL, Intracatheter, PRN, Richard Jack, MD, 10 mL at 07/10/22 1312   sodium chloride flush (NS) 0.9 % injection 20 mL, 20 mL, Intravenous, PRN, Richard Jack, MD, 20 mL at 07/21/19 1053   Allergies: No Known Allergies  REVIEW OF SYSTEMS:   Review of Systems  Constitutional:  Negative for chills, fatigue and fever.  HENT:   Negative for lump/mass, mouth sores, nosebleeds, sore throat and trouble swallowing.   Eyes:  Negative for eye problems.  Respiratory:  Negative for cough and shortness of breath.   Cardiovascular:  Negative for chest pain, leg swelling and palpitations.  Gastrointestinal:  Negative for abdominal pain, constipation, diarrhea, nausea and vomiting.  Genitourinary:  Negative for bladder  incontinence, difficulty urinating, dysuria, frequency, hematuria and nocturia.   Musculoskeletal:  Negative for arthralgias, back pain, flank pain, myalgias and neck pain.  Skin:  Negative for itching and rash.  Neurological:  Negative for dizziness, headaches and numbness.  Hematological:  Does not bruise/bleed easily.  Psychiatric/Behavioral:  Negative for depression, sleep disturbance and suicidal ideas. The patient is not nervous/anxious.   All other systems reviewed and are negative.    VITALS:   Blood pressure 132/74, pulse 72, temperature 98 F (36.7 C), temperature source Oral, resp. rate 18, weight 217 lb 4.8 oz (98.6 kg), SpO2 96 %.  Wt Readings from Last 3 Encounters:  07/10/22 217 lb 4.8 oz (98.6 kg)  05/29/22 214 lb 8 oz (97.3 kg)  05/22/22 220 lb (99.8 kg)    Body mass index is 31.63 kg/m.  Performance status (ECOG): 1 - Symptomatic but completely ambulatory  PHYSICAL EXAM:   Physical Exam Vitals and nursing note reviewed. Exam conducted with a chaperone present.  Constitutional:      Appearance: Normal appearance.  Cardiovascular:     Rate and Rhythm: Normal rate and regular rhythm.     Pulses: Normal pulses.     Heart sounds: Normal heart sounds.  Pulmonary:     Effort: Pulmonary effort is normal.     Breath sounds: Normal breath sounds.  Abdominal:     Palpations: Abdomen is soft. There is no hepatomegaly, splenomegaly or mass.     Tenderness: There is no abdominal tenderness.  Musculoskeletal:     Right lower leg: No edema.     Left lower leg: No edema.  Lymphadenopathy:     Cervical: No cervical adenopathy.     Right cervical: No superficial, deep or posterior cervical adenopathy.    Left cervical: No superficial, deep or posterior cervical adenopathy.     Upper Body:     Right upper body: No supraclavicular or axillary adenopathy.     Left upper body: No supraclavicular or axillary adenopathy.  Neurological:     General: No focal deficit present.      Mental Status: He is alert and oriented to person, place, and time.  Psychiatric:        Mood and Affect: Mood normal.        Behavior: Behavior normal.     LABS:      Latest Ref Rng & Units 07/10/2022    9:49 AM 05/29/2022    8:34 AM 04/25/2022   10:51 AM  CBC  WBC 4.0 - 10.5 K/uL 4.8  8.7  5.7   Hemoglobin 13.0 - 17.0 g/dL 16.1  16.4  14.6   Hematocrit 39.0 - 52.0 % 48.7  49.9  44.7   Platelets 150 - 400 K/uL 155  142  95       Latest Ref Rng & Units 07/10/2022    9:49 AM 05/29/2022    8:34 AM 04/25/2022   10:51 AM  CMP  Glucose 70 - 99 mg/dL 103  114  102   BUN 8 - 23 mg/dL 16  18  19  Creatinine 0.61 - 1.24 mg/dL 1.08  1.02  0.98   Sodium 135 - 145 mmol/L 140  138  142   Potassium 3.5 - 5.1 mmol/L 4.3  4.3  4.1   Chloride 98 - 111 mmol/L 106  105  109   CO2 22 - 32 mmol/L '25  26  26   '$ Calcium 8.9 - 10.3 mg/dL 9.3  9.7  9.2   Total Protein 6.5 - 8.1 g/dL 6.5  6.7  6.2   Total Bilirubin 0.3 - 1.2 mg/dL 0.7  0.7  1.0   Alkaline Phos 38 - 126 U/L 65  63  60   AST 15 - 41 U/L '25  22  22   '$ ALT 0 - 44 U/L 29  27  33      No results found for: "CEA1", "CEA" / No results found for: "CEA1", "CEA" No results found for: "PSA1" No results found for: "[id number]" No results found for: "CAN125"  Lab Results  Component Value Date   TOTALPROTELP 6.2 07/08/2018   ALBUMINELP 3.8 07/08/2018   A1GS 0.2 07/08/2018   A2GS 0.6 07/08/2018   BETS 1.0 07/08/2018   GAMS 0.6 07/08/2018   MSPIKE Not Observed 07/08/2018   SPEI Comment 07/08/2018   Lab Results  Component Value Date   FERRITIN 429 (H) 07/08/2018   Lab Results  Component Value Date   LDH 154 02/27/2022   LDH 147 11/28/2021   LDH 157 10/17/2021     STUDIES:   No results found.

## 2022-07-10 ENCOUNTER — Inpatient Hospital Stay: Payer: Medicare Other | Attending: Hematology

## 2022-07-10 ENCOUNTER — Inpatient Hospital Stay (HOSPITAL_BASED_OUTPATIENT_CLINIC_OR_DEPARTMENT_OTHER): Payer: Medicare Other | Admitting: Hematology

## 2022-07-10 ENCOUNTER — Inpatient Hospital Stay: Payer: Medicare Other

## 2022-07-10 ENCOUNTER — Encounter: Payer: Self-pay | Admitting: Hematology

## 2022-07-10 VITALS — BP 149/87 | HR 63 | Temp 98.0°F | Resp 18

## 2022-07-10 DIAGNOSIS — Z95828 Presence of other vascular implants and grafts: Secondary | ICD-10-CM

## 2022-07-10 DIAGNOSIS — C9200 Acute myeloblastic leukemia, not having achieved remission: Secondary | ICD-10-CM

## 2022-07-10 DIAGNOSIS — N183 Chronic kidney disease, stage 3 unspecified: Secondary | ICD-10-CM | POA: Diagnosis not present

## 2022-07-10 DIAGNOSIS — D46Z Other myelodysplastic syndromes: Secondary | ICD-10-CM

## 2022-07-10 DIAGNOSIS — F0390 Unspecified dementia without behavioral disturbance: Secondary | ICD-10-CM | POA: Diagnosis not present

## 2022-07-10 DIAGNOSIS — Z87891 Personal history of nicotine dependence: Secondary | ICD-10-CM | POA: Insufficient documentation

## 2022-07-10 DIAGNOSIS — Z5111 Encounter for antineoplastic chemotherapy: Secondary | ICD-10-CM | POA: Insufficient documentation

## 2022-07-10 DIAGNOSIS — I129 Hypertensive chronic kidney disease with stage 1 through stage 4 chronic kidney disease, or unspecified chronic kidney disease: Secondary | ICD-10-CM | POA: Diagnosis not present

## 2022-07-10 DIAGNOSIS — Z79899 Other long term (current) drug therapy: Secondary | ICD-10-CM | POA: Diagnosis not present

## 2022-07-10 LAB — COMPREHENSIVE METABOLIC PANEL
ALT: 29 U/L (ref 0–44)
AST: 25 U/L (ref 15–41)
Albumin: 3.9 g/dL (ref 3.5–5.0)
Alkaline Phosphatase: 65 U/L (ref 38–126)
Anion gap: 9 (ref 5–15)
BUN: 16 mg/dL (ref 8–23)
CO2: 25 mmol/L (ref 22–32)
Calcium: 9.3 mg/dL (ref 8.9–10.3)
Chloride: 106 mmol/L (ref 98–111)
Creatinine, Ser: 1.08 mg/dL (ref 0.61–1.24)
GFR, Estimated: >60 mL/min (ref >60)
Glucose, Bld: 103 mg/dL — ABNORMAL HIGH (ref 70–99)
Potassium: 4.3 mmol/L (ref 3.5–5.1)
Sodium: 140 mmol/L (ref 135–145)
Total Bilirubin: 0.7 mg/dL (ref 0.3–1.2)
Total Protein: 6.5 g/dL (ref 6.5–8.1)

## 2022-07-10 LAB — CBC WITH DIFFERENTIAL/PLATELET
Abs Immature Granulocytes: 0.07 10*3/uL (ref 0.00–0.07)
Basophils Absolute: 0 10*3/uL (ref 0.0–0.1)
Basophils Relative: 1 %
Eosinophils Absolute: 0.1 10*3/uL (ref 0.0–0.5)
Eosinophils Relative: 2 %
HCT: 48.7 % (ref 39.0–52.0)
Hemoglobin: 16.1 g/dL (ref 13.0–17.0)
Immature Granulocytes: 2 %
Lymphocytes Relative: 20 %
Lymphs Abs: 1 10*3/uL (ref 0.7–4.0)
MCH: 30.8 pg (ref 26.0–34.0)
MCHC: 33.1 g/dL (ref 30.0–36.0)
MCV: 93.1 fL (ref 80.0–100.0)
Monocytes Absolute: 0.4 10*3/uL (ref 0.1–1.0)
Monocytes Relative: 8 %
Neutro Abs: 3.2 10*3/uL (ref 1.7–7.7)
Neutrophils Relative %: 67 %
Platelets: 155 10*3/uL (ref 150–400)
RBC: 5.23 MIL/uL (ref 4.22–5.81)
RDW: 14.3 % (ref 11.5–15.5)
WBC: 4.8 10*3/uL (ref 4.0–10.5)
nRBC: 0 % (ref 0.0–0.2)

## 2022-07-10 MED ORDER — SODIUM CHLORIDE 0.9 % IV SOLN
15.0000 mg/m2 | Freq: Once | INTRAVENOUS | Status: AC
  Administered 2022-07-10: 35 mg via INTRAVENOUS
  Filled 2022-07-10: qty 7

## 2022-07-10 MED ORDER — SODIUM CHLORIDE 0.9% FLUSH
10.0000 mL | INTRAVENOUS | Status: DC | PRN
  Administered 2022-07-10: 10 mL

## 2022-07-10 MED ORDER — HEPARIN SOD (PORK) LOCK FLUSH 100 UNIT/ML IV SOLN
500.0000 [IU] | Freq: Once | INTRAVENOUS | Status: AC | PRN
  Administered 2022-07-10: 500 [IU]

## 2022-07-10 MED ORDER — SODIUM CHLORIDE 0.9 % IV SOLN: Freq: Once | INTRAVENOUS | Status: AC

## 2022-07-10 MED ORDER — ONDANSETRON HCL 4 MG/2ML IJ SOLN
4.0000 mg | Freq: Once | INTRAMUSCULAR | Status: AC
  Administered 2022-07-10: 4 mg via INTRAVENOUS
  Filled 2022-07-10: qty 2

## 2022-07-10 NOTE — Patient Instructions (Signed)
[place] at [facility] Discharge Instructions   You were seen and examined today by Dr. Delton Coombes.  He reviewed the results of your lab work which are normal/stable.   We will proceed with your treatment today.   Continue Venetoclax as prescribed.   Return as scheduled.    Thank you for choosing [facility] at [facility] to provide your oncology and hematology care.  To afford each patient quality time with our provider, please arrive at least 15 minutes before your scheduled appointment time.   If you have a lab appointment with the [facility] please come in thru the Main Entrance and check in at the main information desk.  You need to re-schedule your appointment should you arrive 10 or more minutes late.  We strive to give you quality time with our providers, and arriving late affects you and other patients whose appointments are after yours.  Also, if you no show three or more times for appointments you may be dismissed from the clinic at the providers discretion.     Again, thank you for choosing [facility].  Our hope is that these requests will decrease the amount of time that you wait before being seen by our physicians.       _____________________________________________________________  Should you have questions after your visit to [facility], please contact our office at [phone] and follow the prompts.  Our office hours are 8:00 a.m. and 4:30 p.m. Monday - Friday.  Please note that voicemails left after 4:00 p.m. may not be returned until the following business day.  We are closed weekends and major holidays.  You do have access to a nurse 24-7, just call the main number to the clinic [phone] and do not press any options, hold on the line and a nurse will answer the phone.    For prescription refill requests, have your pharmacy contact our office and allow 72 hours.    Due to Covid, you  will need to wear a mask upon entering the hospital. If you do not have a mask, a mask will be given to you at the Main Entrance upon arrival. For doctor visits, patients may have 1 support person age 71 or older with them. For treatment visits, patients can not have anyone with them due to social distancing guidelines and our immunocompromised population.

## 2022-07-10 NOTE — Progress Notes (Signed)
Patient is taking venetoclax as prescribed.  He has not missed any doses and reports no side effects at this time.    Patient has been examined by Dr. Delton Coombes. Vital signs and labs have been reviewed by MD - ANC, Creatinine, LFTs, hemoglobin, and platelets are within treatment parameters per M.D. - pt may proceed with treatment.  Primary RN and pharmacy notified.

## 2022-07-10 NOTE — Patient Instructions (Signed)
Richard Wallace  Discharge Instructions: Thank you for choosing [facility] to provide your oncology and hematology care.  If you have a lab appointment with the [facility], please come in thru the Main Entrance and check in at the main information desk.  Wear comfortable clothing and clothing appropriate for easy access to any Portacath or PICC line.   We strive to give you quality time with your provider. You may need to reschedule your appointment if you arrive late (15 or more minutes).  Arriving late affects you and other patients whose appointments are after yours.  Also, if you miss three or more appointments without notifying the office, you may be dismissed from the clinic at the provider's discretion.      For prescription refill requests, have your pharmacy contact our office and allow 72 hours for refills to be completed.    Today you received the following chemotherapy and/or immunotherapy agents Decitabine      To help prevent nausea and vomiting after your treatment, we encourage you to take your nausea medication as directed.  BELOW ARE SYMPTOMS THAT SHOULD BE REPORTED IMMEDIATELY: *FEVER GREATER THAN 100.4 F (38 C) OR HIGHER *CHILLS OR SWEATING *NAUSEA AND VOMITING THAT IS NOT CONTROLLED WITH YOUR NAUSEA MEDICATION *UNUSUAL SHORTNESS OF BREATH *UNUSUAL BRUISING OR BLEEDING *URINARY PROBLEMS (pain or burning when urinating, or frequent urination) *BOWEL PROBLEMS (unusual diarrhea, constipation, pain near the anus) TENDERNESS IN MOUTH AND THROAT WITH OR WITHOUT PRESENCE OF ULCERS (sore throat, sores in mouth, or a toothache) UNUSUAL RASH, SWELLING OR PAIN  UNUSUAL VAGINAL DISCHARGE OR ITCHING   Items with * indicate a potential emergency and should be followed up as soon as possible or go to the Emergency Department if any problems should occur.  Please show the CHEMOTHERAPY ALERT CARD or IMMUNOTHERAPY ALERT CARD at check-in to the  Emergency Department and triage nurse.  Should you have questions after your visit or need to cancel or reschedule your appointment, please contact [facility] [phone]  and follow the prompts.  Office hours are 8:00 a.m. to 4:30 p.m. Monday - Friday. Please note that voicemails left after 4:00 p.m. may not be returned until the following business day.  We are closed weekends and major holidays. You have access to a nurse at all times for urgent questions. Please call the main number to the clinic [phone] and follow the prompts.  For any non-urgent questions, you may also contact your provider using MyChart. We now offer e-Visits for anyone 27 and older to request care online for non-urgent symptoms. For details visit [email or web address].   Also download the MyChart app! Go to the app store, search "MyChart", open the app, select [facility], and log in with your MyChart username and password.

## 2022-07-10 NOTE — Progress Notes (Signed)
Patient presents today for Decitabine infusion per providers order.  Vital signs and labs reviewed by the MD.  Message received from Anastasio Champion RN/Dr. Delton Coombes patient okay for treatment.  Treatment given today per MD orders.  Stable during infusion without adverse affects.  Vital signs stable.  No complaints at this time.  Discharge from clinic ambulatory in stable condition.  Alert and oriented X 3.  Follow up with [facility] as scheduled.

## 2022-07-11 ENCOUNTER — Inpatient Hospital Stay: Payer: Medicare Other

## 2022-07-11 VITALS — BP 143/80 | HR 62 | Temp 97.4°F | Resp 18

## 2022-07-11 DIAGNOSIS — Z95828 Presence of other vascular implants and grafts: Secondary | ICD-10-CM

## 2022-07-11 DIAGNOSIS — Z5111 Encounter for antineoplastic chemotherapy: Secondary | ICD-10-CM | POA: Diagnosis not present

## 2022-07-11 DIAGNOSIS — C9200 Acute myeloblastic leukemia, not having achieved remission: Secondary | ICD-10-CM

## 2022-07-11 DIAGNOSIS — D46Z Other myelodysplastic syndromes: Secondary | ICD-10-CM

## 2022-07-11 MED ORDER — SODIUM CHLORIDE 0.9% FLUSH
10.0000 mL | INTRAVENOUS | Status: DC | PRN
  Administered 2022-07-11: 10 mL

## 2022-07-11 MED ORDER — SODIUM CHLORIDE 0.9 % IV SOLN: Freq: Once | INTRAVENOUS | Status: AC

## 2022-07-11 MED ORDER — ONDANSETRON HCL 4 MG/2ML IJ SOLN
4.0000 mg | Freq: Once | INTRAMUSCULAR | Status: AC
  Administered 2022-07-11: 4 mg via INTRAVENOUS
  Filled 2022-07-11: qty 2

## 2022-07-11 MED ORDER — SODIUM CHLORIDE 0.9 % IV SOLN
15.0000 mg/m2 | Freq: Once | INTRAVENOUS | Status: AC
  Administered 2022-07-11: 35 mg via INTRAVENOUS
  Filled 2022-07-11: qty 7

## 2022-07-11 MED ORDER — HEPARIN SOD (PORK) LOCK FLUSH 100 UNIT/ML IV SOLN
500.0000 [IU] | Freq: Once | INTRAVENOUS | Status: AC | PRN
  Administered 2022-07-11: 500 [IU]

## 2022-07-11 NOTE — Patient Instructions (Signed)
[facility]  Discharge Instructions: Thank you for choosing [facility] to provide your oncology and hematology care.  If you have a lab appointment with the [facility], please come in thru the Main Entrance and check in at the main information desk.  Wear comfortable clothing and clothing appropriate for easy access to any Portacath or PICC line.   We strive to give you quality time with your provider. You may need to reschedule your appointment if you arrive late (15 or more minutes).  Arriving late affects you and other patients whose appointments are after yours.  Also, if you miss three or more appointments without notifying the office, you may be dismissed from the clinic at the provider's discretion.      For prescription refill requests, have your pharmacy contact our office and allow 72 hours for refills to be completed.    Today you received the following chemotherapy and/or immunotherapy agents D2 Decitabine   To help prevent nausea and vomiting after your treatment, we encourage you to take your nausea medication as directed.  Decitabine Injection What is this medication? DECITABINE (dee SYE ta been) treats blood and bone marrow cancers. It works by slowing down the growth of cancer cells. This medicine may be used for other purposes; ask your health care provider or pharmacist if you have questions. COMMON BRAND NAME(S): Dacogen What should I tell my care team before I take this medication? They need to know if you have any of these conditions: Infection Kidney disease Liver disease An unusual or allergic reaction to decitabine, other medications, foods, dyes, or preservatives Pregnant or trying to get pregnant Breast-feeding How should I use this medication? This medication is infused into a vein. It is given by your care team in a hospital or clinic setting. Talk to your care team about the use of this medication in children. Special  care may be needed. Overdosage: If you think you have taken too much of this medicine contact a poison control center or emergency room at once. NOTE: This medicine is only for you. Do not share this medicine with others. What if I miss a dose? Keep appointments for follow-up doses. It is important not to miss your dose. Call your care team if you are unable to keep an appointment. What may interact with this medication? Interactions are not expected. This list may not describe all possible interactions. Give your health care provider a list of all the medicines, herbs, non-prescription drugs, or dietary supplements you use. Also tell them if you smoke, drink alcohol, or use illegal drugs. Some items may interact with your medicine. What should I watch for while using this medication? Visit your care team for regular checks on your progress. You may need blood work while taking this medication. This medication may make you feel generally unwell. This is not uncommon as chemotherapy can affect healthy cells as well as cancer cells. Report any side effects. Continue your course of treatment even though you feel ill unless your care team tells you to stop. This medication may increase your risk of getting an infection. Call your care team for advice if you get a fever, chills, sore throat, or other symptoms of a cold or flu. Do not treat yourself. Try to avoid being around people who are sick. Be careful brushing or flossing your teeth or using a toothpick because you may get an infection or bleed more easily. If you have any dental work  done, tell your dentist you are receiving this medication. Call your care team if you are around anyone with measles, chickenpox, or if you develop sores or blisters that do not heal properly. Avoid taking medications that contain aspirin, acetaminophen, ibuprofen, naproxen, or ketoprofen unless instructed by your care team. These medications may hide a fever. Talk to  your care team if you or your partner wish to become pregnant or think either of you might be pregnant. This medication can cause serious birth defects if taken during pregnancy or for 6 months after the last dose. A negative pregnancy test is required before starting this medication. A reliable form of contraception is recommended while taking this medication and for 6 months after the last dose. Talk to your care team about reliable forms of contraception. Do not father a child while taking this medication or for 3 months after the last dose. Use a condom while having sex during this time period. Do not breast-feed while taking this medication and for 2 weeks after the last dose. This medication may cause infertility. Talk to your care team if you are concerned about your fertility. What side effects may I notice from receiving this medication? Side effects that you should report to your care team as soon as possible: Allergic reactions--skin rash, itching, hives, swelling of the face, lips, tongue, or throat Infection--fever, chills, cough, sore throat, wounds that don't heal, pain or trouble when passing urine, general feeling of discomfort or being unwell Low red blood cell level--unusual weakness or fatigue, dizziness, headache, trouble breathing Unusual bruising or bleeding Side effects that usually do not require medical attention (report to your care team if they continue or are bothersome): Constipation Diarrhea Fatigue Nausea Pain, redness, or swelling with sores inside the mouth or throat Stomach pain This list may not describe all possible side effects. Call your doctor for medical advice about side effects. You may report side effects to FDA at 1-800-FDA-1088. Where should I keep my medication? This medication is given in a hospital or clinic. It will not be stored at home. NOTE: This sheet is a summary. It may not cover all possible information. If you have questions about this  medicine, talk to your doctor, pharmacist, or health care provider.  2023 Elsevier/Gold Standard (2007-06-15 00:00:00)   BELOW ARE SYMPTOMS THAT SHOULD BE REPORTED IMMEDIATELY: *FEVER GREATER THAN 100.4 F (38 C) OR HIGHER *CHILLS OR SWEATING *NAUSEA AND VOMITING THAT IS NOT CONTROLLED WITH YOUR NAUSEA MEDICATION *UNUSUAL SHORTNESS OF BREATH *UNUSUAL BRUISING OR BLEEDING *URINARY PROBLEMS (pain or burning when urinating, or frequent urination) *BOWEL PROBLEMS (unusual diarrhea, constipation, pain near the anus) TENDERNESS IN MOUTH AND THROAT WITH OR WITHOUT PRESENCE OF ULCERS (sore throat, sores in mouth, or a toothache) UNUSUAL RASH, SWELLING OR PAIN  UNUSUAL VAGINAL DISCHARGE OR ITCHING   Items with * indicate a potential emergency and should be followed up as soon as possible or go to the Emergency Department if any problems should occur.  Please show the CHEMOTHERAPY ALERT CARD or IMMUNOTHERAPY ALERT CARD at check-in to the Emergency Department and triage nurse.  Should you have questions after your visit or need to cancel or reschedule your appointment, please contact [facility] [phone]  and follow the prompts.  Office hours are 8:00 a.m. to 4:30 p.m. Monday - Friday. Please note that voicemails left after 4:00 p.m. may not be returned until the following business day.  We are closed weekends and major holidays. You have  access to a nurse at all times for urgent questions. Please call the main number to the clinic [phone] and follow the prompts.  For any non-urgent questions, you may also contact your provider using MyChart. We now offer e-Visits for anyone 20 and older to request care online for non-urgent symptoms. For details visit [email or web address].   Also download the MyChart app! Go to the app store, search "MyChart", open the app, select [facility], and log in with your MyChart username and password.

## 2022-07-11 NOTE — Progress Notes (Signed)
Pt presents today for D2 Decitabine per provider's order. Vital signs stable and pt voiced no new complaints at this time.   D2 Decitabine given today per MD orders. Tolerated infusion without adverse affects. Vital signs stable. No complaints at this time. Discharged from clinic via motorized chair in stable condition. Alert and oriented x 3. F/U with [facility] as scheduled.

## 2022-07-12 ENCOUNTER — Inpatient Hospital Stay: Payer: Medicare Other

## 2022-07-12 ENCOUNTER — Other Ambulatory Visit: Payer: Self-pay

## 2022-07-12 VITALS — BP 157/89 | HR 58 | Temp 97.1°F | Resp 18

## 2022-07-12 DIAGNOSIS — Z95828 Presence of other vascular implants and grafts: Secondary | ICD-10-CM

## 2022-07-12 DIAGNOSIS — C9200 Acute myeloblastic leukemia, not having achieved remission: Secondary | ICD-10-CM

## 2022-07-12 DIAGNOSIS — Z5111 Encounter for antineoplastic chemotherapy: Secondary | ICD-10-CM | POA: Diagnosis not present

## 2022-07-12 DIAGNOSIS — D46Z Other myelodysplastic syndromes: Secondary | ICD-10-CM

## 2022-07-12 MED ORDER — HEPARIN SOD (PORK) LOCK FLUSH 100 UNIT/ML IV SOLN
500.0000 [IU] | Freq: Once | INTRAVENOUS | Status: AC | PRN
  Administered 2022-07-12: 500 [IU]

## 2022-07-12 MED ORDER — SODIUM CHLORIDE 0.9% FLUSH
10.0000 mL | INTRAVENOUS | Status: DC | PRN
  Administered 2022-07-12: 10 mL

## 2022-07-12 MED ORDER — SODIUM CHLORIDE 0.9 % IV SOLN: Freq: Once | INTRAVENOUS | Status: AC

## 2022-07-12 MED ORDER — ONDANSETRON HCL 4 MG/2ML IJ SOLN
4.0000 mg | Freq: Once | INTRAMUSCULAR | Status: AC
  Administered 2022-07-12: 4 mg via INTRAVENOUS
  Filled 2022-07-12: qty 2

## 2022-07-12 MED ORDER — SODIUM CHLORIDE 0.9 % IV SOLN
15.0000 mg/m2 | Freq: Once | INTRAVENOUS | Status: AC
  Administered 2022-07-12: 35 mg via INTRAVENOUS
  Filled 2022-07-12: qty 7

## 2022-07-12 NOTE — Progress Notes (Signed)
Pt presents today for D2 Decitabine per provider's order. Vital signs stable and pt voiced no new complaints at this time.  D2 Decitabine given today per MD orders. Tolerated infusion without adverse affects. Vital signs stable. No complaints at this time. Discharged from clinic ambulatory in stable condition. Alert and oriented x 3. F/U with [facility] as scheduled.

## 2022-07-12 NOTE — Patient Instructions (Signed)
[facility]  Discharge Instructions: Thank you for choosing [facility] to provide your oncology and hematology care.  If you have a lab appointment with the [facility], please come in thru the Main Entrance and check in at the main information desk.  Wear comfortable clothing and clothing appropriate for easy access to any Portacath or PICC line.   We strive to give you quality time with your provider. You may need to reschedule your appointment if you arrive late (15 or more minutes).  Arriving late affects you and other patients whose appointments are after yours.  Also, if you miss three or more appointments without notifying the office, you may be dismissed from the clinic at the provider's discretion.      For prescription refill requests, have your pharmacy contact our office and allow 72 hours for refills to be completed.    Today you received the following chemotherapy and/or immunotherapy agents Decitabine   To help prevent nausea and vomiting after your treatment, we encourage you to take your nausea medication as directed.   Decitabine Injection What is this medication? DECITABINE (dee SYE ta been) treats blood and bone marrow cancers. It works by slowing down the growth of cancer cells. This medicine may be used for other purposes; ask your health care provider or pharmacist if you have questions. COMMON BRAND NAME(S): Dacogen What should I tell my care team before I take this medication? They need to know if you have any of these conditions: Infection Kidney disease Liver disease An unusual or allergic reaction to decitabine, other medications, foods, dyes, or preservatives Pregnant or trying to get pregnant Breast-feeding How should I use this medication? This medication is infused into a vein. It is given by your care team in a hospital or clinic setting. Talk to your care team about the use of this medication in children. Special  care may be needed. Overdosage: If you think you have taken too much of this medicine contact a poison control center or emergency room at once. NOTE: This medicine is only for you. Do not share this medicine with others. What if I miss a dose? Keep appointments for follow-up doses. It is important not to miss your dose. Call your care team if you are unable to keep an appointment. What may interact with this medication? Interactions are not expected. This list may not describe all possible interactions. Give your health care provider a list of all the medicines, herbs, non-prescription drugs, or dietary supplements you use. Also tell them if you smoke, drink alcohol, or use illegal drugs. Some items may interact with your medicine. What should I watch for while using this medication? Visit your care team for regular checks on your progress. You may need blood work while taking this medication. This medication may make you feel generally unwell. This is not uncommon as chemotherapy can affect healthy cells as well as cancer cells. Report any side effects. Continue your course of treatment even though you feel ill unless your care team tells you to stop. This medication may increase your risk of getting an infection. Call your care team for advice if you get a fever, chills, sore throat, or other symptoms of a cold or flu. Do not treat yourself. Try to avoid being around people who are sick. Be careful brushing or flossing your teeth or using a toothpick because you may get an infection or bleed more easily. If you have any dental work  done, tell your dentist you are receiving this medication. Call your care team if you are around anyone with measles, chickenpox, or if you develop sores or blisters that do not heal properly. Avoid taking medications that contain aspirin, acetaminophen, ibuprofen, naproxen, or ketoprofen unless instructed by your care team. These medications may hide a fever. Talk to  your care team if you or your partner wish to become pregnant or think either of you might be pregnant. This medication can cause serious birth defects if taken during pregnancy or for 6 months after the last dose. A negative pregnancy test is required before starting this medication. A reliable form of contraception is recommended while taking this medication and for 6 months after the last dose. Talk to your care team about reliable forms of contraception. Do not father a child while taking this medication or for 3 months after the last dose. Use a condom while having sex during this time period. Do not breast-feed while taking this medication and for 2 weeks after the last dose. This medication may cause infertility. Talk to your care team if you are concerned about your fertility. What side effects may I notice from receiving this medication? Side effects that you should report to your care team as soon as possible: Allergic reactions--skin rash, itching, hives, swelling of the face, lips, tongue, or throat Infection--fever, chills, cough, sore throat, wounds that don't heal, pain or trouble when passing urine, general feeling of discomfort or being unwell Low red blood cell level--unusual weakness or fatigue, dizziness, headache, trouble breathing Unusual bruising or bleeding Side effects that usually do not require medical attention (report to your care team if they continue or are bothersome): Constipation Diarrhea Fatigue Nausea Pain, redness, or swelling with sores inside the mouth or throat Stomach pain This list may not describe all possible side effects. Call your doctor for medical advice about side effects. You may report side effects to FDA at 1-800-FDA-1088. Where should I keep my medication? This medication is given in a hospital or clinic. It will not be stored at home. NOTE: This sheet is a summary. It may not cover all possible information. If you have questions about this  medicine, talk to your doctor, pharmacist, or health care provider.  2023 Elsevier/Gold Standard (2007-06-15 00:00:00)  BELOW ARE SYMPTOMS THAT SHOULD BE REPORTED IMMEDIATELY: *FEVER GREATER THAN 100.4 F (38 C) OR HIGHER *CHILLS OR SWEATING *NAUSEA AND VOMITING THAT IS NOT CONTROLLED WITH YOUR NAUSEA MEDICATION *UNUSUAL SHORTNESS OF BREATH *UNUSUAL BRUISING OR BLEEDING *URINARY PROBLEMS (pain or burning when urinating, or frequent urination) *BOWEL PROBLEMS (unusual diarrhea, constipation, pain near the anus) TENDERNESS IN MOUTH AND THROAT WITH OR WITHOUT PRESENCE OF ULCERS (sore throat, sores in mouth, or a toothache) UNUSUAL RASH, SWELLING OR PAIN  UNUSUAL VAGINAL DISCHARGE OR ITCHING   Items with * indicate a potential emergency and should be followed up as soon as possible or go to the Emergency Department if any problems should occur.  Please show the CHEMOTHERAPY ALERT CARD or IMMUNOTHERAPY ALERT CARD at check-in to the Emergency Department and triage nurse.  Should you have questions after your visit or need to cancel or reschedule your appointment, please contact [facility] [phone]  and follow the prompts.  Office hours are 8:00 a.m. to 4:30 p.m. Monday - Friday. Please note that voicemails left after 4:00 p.m. may not be returned until the following business day.  We are closed weekends and major holidays. You have access  to a nurse at all times for urgent questions. Please call the main number to the clinic [phone] and follow the prompts.  For any non-urgent questions, you may also contact your provider using MyChart. We now offer e-Visits for anyone 60 and older to request care online for non-urgent symptoms. For details visit [email or web address].   Also download the MyChart app! Go to the app store, search "MyChart", open the app, select [facility], and log in with your MyChart username and password.

## 2022-07-13 ENCOUNTER — Inpatient Hospital Stay: Payer: Medicare Other

## 2022-07-13 VITALS — BP 166/81 | HR 57 | Temp 97.3°F | Resp 18

## 2022-07-13 DIAGNOSIS — Z95828 Presence of other vascular implants and grafts: Secondary | ICD-10-CM

## 2022-07-13 DIAGNOSIS — D46Z Other myelodysplastic syndromes: Secondary | ICD-10-CM

## 2022-07-13 DIAGNOSIS — Z5111 Encounter for antineoplastic chemotherapy: Secondary | ICD-10-CM | POA: Diagnosis not present

## 2022-07-13 DIAGNOSIS — C9200 Acute myeloblastic leukemia, not having achieved remission: Secondary | ICD-10-CM

## 2022-07-13 MED ORDER — SODIUM CHLORIDE 0.9 % IV SOLN
15.0000 mg/m2 | Freq: Once | INTRAVENOUS | Status: AC
  Administered 2022-07-13: 35 mg via INTRAVENOUS
  Filled 2022-07-13: qty 7

## 2022-07-13 MED ORDER — HEPARIN SOD (PORK) LOCK FLUSH 100 UNIT/ML IV SOLN
500.0000 [IU] | Freq: Once | INTRAVENOUS | Status: AC | PRN
  Administered 2022-07-13: 500 [IU]

## 2022-07-13 MED ORDER — SODIUM CHLORIDE 0.9% FLUSH
10.0000 mL | INTRAVENOUS | Status: DC | PRN
  Administered 2022-07-13: 10 mL

## 2022-07-13 MED ORDER — ONDANSETRON HCL 4 MG/2ML IJ SOLN
4.0000 mg | Freq: Once | INTRAMUSCULAR | Status: AC
  Administered 2022-07-13: 4 mg via INTRAVENOUS
  Filled 2022-07-13: qty 2

## 2022-07-13 MED ORDER — SODIUM CHLORIDE 0.9 % IV SOLN: Freq: Once | INTRAVENOUS | Status: AC

## 2022-07-13 NOTE — Progress Notes (Signed)
Pt presents today for D4 Dectiabine per provider's order. Vital signs stable and pt voiced no new complaints at this time.  D4 Decitabine given today per MD orders. Tolerated infusion without adverse affects. Vital signs stable. No complaints at this time. Discharged from clinic via motorized chair in stable condition. Alert and oriented x 3. F/U with [facility] as scheduled.

## 2022-07-13 NOTE — Patient Instructions (Signed)
[facility]  Discharge Instructions: Thank you for choosing [facility] to provide your oncology and hematology care.  If you have a lab appointment with the [facility], please come in thru the Main Entrance and check in at the main information desk.  Wear comfortable clothing and clothing appropriate for easy access to any Portacath or PICC line.   We strive to give you quality time with your provider. You may need to reschedule your appointment if you arrive late (15 or more minutes).  Arriving late affects you and other patients whose appointments are after yours.  Also, if you miss three or more appointments without notifying the office, you may be dismissed from the clinic at the provider's discretion.      For prescription refill requests, have your pharmacy contact our office and allow 72 hours for refills to be completed.    Today you received the following chemotherapy and/or immunotherapy agents D4 Decitabine   BELOW ARE SYMPTOMS THAT SHOULD BE REPORTED IMMEDIATELY: *FEVER GREATER THAN 100.4 F (38 C) OR HIGHER *CHILLS OR SWEATING *NAUSEA AND VOMITING THAT IS NOT CONTROLLED WITH YOUR NAUSEA MEDICATION *UNUSUAL SHORTNESS OF BREATH *UNUSUAL BRUISING OR BLEEDING *URINARY PROBLEMS (pain or burning when urinating, or frequent urination) *BOWEL PROBLEMS (unusual diarrhea, constipation, pain near the anus) TENDERNESS IN MOUTH AND THROAT WITH OR WITHOUT PRESENCE OF ULCERS (sore throat, sores in mouth, or a toothache) UNUSUAL RASH, SWELLING OR PAIN  UNUSUAL VAGINAL DISCHARGE OR ITCHING   Items with * indicate a potential emergency and should be followed up as soon as possible or go to the Emergency Department if any problems should occur.  Please show the CHEMOTHERAPY ALERT CARD or IMMUNOTHERAPY ALERT CARD at check-in to the Emergency Department and triage nurse.  Should you have questions after your visit or need to cancel or reschedule your  appointment, please contact [facility] [phone]  and follow the prompts.  Office hours are 8:00 a.m. to 4:30 p.m. Monday - Friday. Please note that voicemails left after 4:00 p.m. may not be returned until the following business day.  We are closed weekends and major holidays. You have access to a nurse at all times for urgent questions. Please call the main number to the clinic [phone] and follow the prompts.  For any non-urgent questions, you may also contact your provider using MyChart. We now offer e-Visits for anyone 43 and older to request care online for non-urgent symptoms. For details visit [email or web address].   Also download the MyChart app! Go to the app store, search "MyChart", open the app, select [facility], and log in with your MyChart username and password.

## 2022-07-14 ENCOUNTER — Inpatient Hospital Stay: Payer: Medicare Other

## 2022-07-14 VITALS — BP 154/91 | HR 70 | Temp 97.1°F | Resp 18

## 2022-07-14 DIAGNOSIS — Z5111 Encounter for antineoplastic chemotherapy: Secondary | ICD-10-CM | POA: Diagnosis not present

## 2022-07-14 DIAGNOSIS — C9200 Acute myeloblastic leukemia, not having achieved remission: Secondary | ICD-10-CM

## 2022-07-14 DIAGNOSIS — Z95828 Presence of other vascular implants and grafts: Secondary | ICD-10-CM

## 2022-07-14 DIAGNOSIS — D46Z Other myelodysplastic syndromes: Secondary | ICD-10-CM

## 2022-07-14 MED ORDER — SODIUM CHLORIDE 0.9% FLUSH
10.0000 mL | INTRAVENOUS | Status: DC | PRN
  Administered 2022-07-14: 10 mL

## 2022-07-14 MED ORDER — SODIUM CHLORIDE 0.9 % IV SOLN: Freq: Once | INTRAVENOUS | Status: AC

## 2022-07-14 MED ORDER — ONDANSETRON HCL 4 MG/2ML IJ SOLN
4.0000 mg | Freq: Once | INTRAMUSCULAR | Status: AC
  Administered 2022-07-14: 4 mg via INTRAVENOUS
  Filled 2022-07-14: qty 2

## 2022-07-14 MED ORDER — HEPARIN SOD (PORK) LOCK FLUSH 100 UNIT/ML IV SOLN
500.0000 [IU] | Freq: Once | INTRAVENOUS | Status: AC | PRN
  Administered 2022-07-14: 500 [IU]

## 2022-07-14 MED ORDER — SODIUM CHLORIDE 0.9 % IV SOLN
15.0000 mg/m2 | Freq: Once | INTRAVENOUS | Status: AC
  Administered 2022-07-14: 35 mg via INTRAVENOUS
  Filled 2022-07-14: qty 7

## 2022-07-14 NOTE — Patient Instructions (Signed)
[facility]  Discharge Instructions: Thank you for choosing [facility] to provide your oncology and hematology care.  If you have a lab appointment with the [facility], please come in thru the Main Entrance and check in at the main information desk.  Wear comfortable clothing and clothing appropriate for easy access to any Portacath or PICC line.   We strive to give you quality time with your provider. You may need to reschedule your appointment if you arrive late (15 or more minutes).  Arriving late affects you and other patients whose appointments are after yours.  Also, if you miss three or more appointments without notifying the office, you may be dismissed from the clinic at the provider's discretion.      For prescription refill requests, have your pharmacy contact our office and allow 72 hours for refills to be completed.    Today you received the following chemotherapy and/or immunotherapy agents Dacogen.  Decitabine Injection What is this medication? DECITABINE (dee SYE ta been) treats blood and bone marrow cancers. It works by slowing down the growth of cancer cells. This medicine may be used for other purposes; ask your health care provider or pharmacist if you have questions. COMMON BRAND NAME(S): Dacogen What should I tell my care team before I take this medication? They need to know if you have any of these conditions: Infection Kidney disease Liver disease An unusual or allergic reaction to decitabine, other medications, foods, dyes, or preservatives Pregnant or trying to get pregnant Breast-feeding How should I use this medication? This medication is infused into a vein. It is given by your care team in a hospital or clinic setting. Talk to your care team about the use of this medication in children. Special care may be needed. Overdosage: If you think you have taken too much of this medicine contact a poison control center or  emergency room at once. NOTE: This medicine is only for you. Do not share this medicine with others. What if I miss a dose? Keep appointments for follow-up doses. It is important not to miss your dose. Call your care team if you are unable to keep an appointment. What may interact with this medication? Interactions are not expected. This list may not describe all possible interactions. Give your health care provider a list of all the medicines, herbs, non-prescription drugs, or dietary supplements you use. Also tell them if you smoke, drink alcohol, or use illegal drugs. Some items may interact with your medicine. What should I watch for while using this medication? Visit your care team for regular checks on your progress. You may need blood work while taking this medication. This medication may make you feel generally unwell. This is not uncommon as chemotherapy can affect healthy cells as well as cancer cells. Report any side effects. Continue your course of treatment even though you feel ill unless your care team tells you to stop. This medication may increase your risk of getting an infection. Call your care team for advice if you get a fever, chills, sore throat, or other symptoms of a cold or flu. Do not treat yourself. Try to avoid being around people who are sick. Be careful brushing or flossing your teeth or using a toothpick because you may get an infection or bleed more easily. If you have any dental work done, tell your dentist you are receiving this medication. Call your care team if you are around anyone with measles, chickenpox, or  if you develop sores or blisters that do not heal properly. Avoid taking medications that contain aspirin, acetaminophen, ibuprofen, naproxen, or ketoprofen unless instructed by your care team. These medications may hide a fever. Talk to your care team if you or your partner wish to become pregnant or think either of you might be pregnant. This medication can  cause serious birth defects if taken during pregnancy or for 6 months after the last dose. A negative pregnancy test is required before starting this medication. A reliable form of contraception is recommended while taking this medication and for 6 months after the last dose. Talk to your care team about reliable forms of contraception. Do not father a child while taking this medication or for 3 months after the last dose. Use a condom while having sex during this time period. Do not breast-feed while taking this medication and for 2 weeks after the last dose. This medication may cause infertility. Talk to your care team if you are concerned about your fertility. What side effects may I notice from receiving this medication? Side effects that you should report to your care team as soon as possible: Allergic reactions--skin rash, itching, hives, swelling of the face, lips, tongue, or throat Infection--fever, chills, cough, sore throat, wounds that don't heal, pain or trouble when passing urine, general feeling of discomfort or being unwell Low red blood cell level--unusual weakness or fatigue, dizziness, headache, trouble breathing Unusual bruising or bleeding Side effects that usually do not require medical attention (report to your care team if they continue or are bothersome): Constipation Diarrhea Fatigue Nausea Pain, redness, or swelling with sores inside the mouth or throat Stomach pain This list may not describe all possible side effects. Call your doctor for medical advice about side effects. You may report side effects to FDA at 1-800-FDA-1088. Where should I keep my medication? This medication is given in a hospital or clinic. It will not be stored at home. NOTE: This sheet is a summary. It may not cover all possible information. If you have questions about this medicine, talk to your doctor, pharmacist, or health care provider.  2023 Elsevier/Gold Standard (2007-06-15 00:00:00)         To help prevent nausea and vomiting after your treatment, we encourage you to take your nausea medication as directed.  BELOW ARE SYMPTOMS THAT SHOULD BE REPORTED IMMEDIATELY: *FEVER GREATER THAN 100.4 F (38 C) OR HIGHER *CHILLS OR SWEATING *NAUSEA AND VOMITING THAT IS NOT CONTROLLED WITH YOUR NAUSEA MEDICATION *UNUSUAL SHORTNESS OF BREATH *UNUSUAL BRUISING OR BLEEDING *URINARY PROBLEMS (pain or burning when urinating, or frequent urination) *BOWEL PROBLEMS (unusual diarrhea, constipation, pain near the anus) TENDERNESS IN MOUTH AND THROAT WITH OR WITHOUT PRESENCE OF ULCERS (sore throat, sores in mouth, or a toothache) UNUSUAL RASH, SWELLING OR PAIN  UNUSUAL VAGINAL DISCHARGE OR ITCHING   Items with * indicate a potential emergency and should be followed up as soon as possible or go to the Emergency Department if any problems should occur.  Please show the CHEMOTHERAPY ALERT CARD or IMMUNOTHERAPY ALERT CARD at check-in to the Emergency Department and triage nurse.  Should you have questions after your visit or need to cancel or reschedule your appointment, please contact [facility] [phone]  and follow the prompts.  Office hours are 8:00 a.m. to 4:30 p.m. Monday - Friday. Please note that voicemails left after 4:00 p.m. may not be returned until the following business day.  We are closed weekends and major  holidays. You have access to a nurse at all times for urgent questions. Please call the main number to the clinic [phone] and follow the prompts.  For any non-urgent questions, you may also contact your provider using MyChart. We now offer e-Visits for anyone 71 and older to request care online for non-urgent symptoms. For details visit [email or web address].   Also download the MyChart app! Go to the app store, search "MyChart", open the app, select [facility], and log in with your MyChart username and password.

## 2022-07-14 NOTE — Progress Notes (Signed)
Patient presents today for chemotherapy treatment, Decitabine, today. Patient in satisfactory condition with no new complaints voiced. Vital signs are stable.No labs needed today for treatment per nursing communication. We will proceed with the treatment per MD orders.

## 2022-07-14 NOTE — Progress Notes (Signed)
Patient tolerated treatment well with no complaints voiced.  Patient left via motorized wheelchair in stable condition.  Vital signs stable at discharge.  Follow up as scheduled.

## 2022-07-15 ENCOUNTER — Other Ambulatory Visit: Payer: Self-pay | Admitting: Hematology

## 2022-07-17 ENCOUNTER — Telehealth: Payer: Self-pay | Admitting: *Deleted

## 2022-07-17 ENCOUNTER — Other Ambulatory Visit: Payer: Self-pay | Admitting: *Deleted

## 2022-07-17 MED ORDER — HYDROXYZINE HCL 25 MG PO TABS: 25.0000 mg | ORAL_TABLET | Freq: Four times a day (QID) | ORAL | 1 refills | Status: DC | PRN

## 2022-07-17 NOTE — Telephone Encounter (Signed)
Received call from wife Mechele Claude stating that patient has been experiencing itching with absence of rash since treatment last week.  Despite the use of benadryl, he is not getting relief.  States that he has also developed painful hands and feet, associated with numbness.  Per Dr. Delton Coombes, there has not been a change in his treatment plan and does not feel either of these are likely from treatment and will send in Atarax 25 mg Q 6 h prn for the itching.  As for the pain an numbness in hands and feet, he would like for neurology to address this at visit on Thursday.  Wife made aware and verbalized understanding.

## 2022-07-18 ENCOUNTER — Ambulatory Visit: Payer: Medicare Other | Admitting: Internal Medicine

## 2022-07-20 ENCOUNTER — Telehealth: Payer: Self-pay | Admitting: Neurology

## 2022-07-20 ENCOUNTER — Other Ambulatory Visit (HOSPITAL_COMMUNITY): Payer: Self-pay

## 2022-07-20 ENCOUNTER — Ambulatory Visit (INDEPENDENT_AMBULATORY_CARE_PROVIDER_SITE_OTHER): Payer: Medicare Other | Admitting: Neurology

## 2022-07-20 ENCOUNTER — Encounter: Payer: Self-pay | Admitting: Neurology

## 2022-07-20 VITALS — BP 135/83 | HR 81 | Ht 69.0 in | Wt 212.6 lb

## 2022-07-20 DIAGNOSIS — M79675 Pain in left toe(s): Secondary | ICD-10-CM | POA: Insufficient documentation

## 2022-07-20 DIAGNOSIS — E7489 Other specified disorders of carbohydrate metabolism: Secondary | ICD-10-CM | POA: Diagnosis not present

## 2022-07-20 DIAGNOSIS — G3184 Mild cognitive impairment, so stated: Secondary | ICD-10-CM | POA: Insufficient documentation

## 2022-07-20 DIAGNOSIS — R410 Disorientation, unspecified: Secondary | ICD-10-CM | POA: Diagnosis not present

## 2022-07-20 DIAGNOSIS — R202 Paresthesia of skin: Secondary | ICD-10-CM | POA: Diagnosis not present

## 2022-07-20 DIAGNOSIS — M79674 Pain in right toe(s): Secondary | ICD-10-CM

## 2022-07-20 MED ORDER — GABAPENTIN 300 MG PO CAPS: 300.0000 mg | ORAL_CAPSULE | Freq: Three times a day (TID) | ORAL | 11 refills | Status: DC | PRN

## 2022-07-20 MED ORDER — ALPRAZOLAM 0.25 MG PO TABS: 0.2500 mg | ORAL_TABLET | Freq: Two times a day (BID) | ORAL | 0 refills | Status: DC | PRN
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
## 2022-07-20 NOTE — Telephone Encounter (Signed)
medicare NPR sent to [facility] [phone] 

## 2022-07-27 ENCOUNTER — Ambulatory Visit: Payer: Medicare Other | Admitting: Family Medicine

## 2022-07-31 ENCOUNTER — Ambulatory Visit (INDEPENDENT_AMBULATORY_CARE_PROVIDER_SITE_OTHER): Payer: Medicare Other | Admitting: Internal Medicine

## 2022-07-31 ENCOUNTER — Encounter: Payer: Self-pay | Admitting: Internal Medicine

## 2022-07-31 ENCOUNTER — Encounter: Payer: Self-pay | Admitting: Hematology

## 2022-07-31 ENCOUNTER — Encounter (HOSPITAL_COMMUNITY): Payer: Self-pay | Admitting: Hematology

## 2022-07-31 VITALS — BP 120/71 | HR 71 | Ht 69.5 in | Wt 217.6 lb

## 2022-07-31 DIAGNOSIS — M7918 Myalgia, other site: Secondary | ICD-10-CM

## 2022-07-31 DIAGNOSIS — R400 Somnolence: Secondary | ICD-10-CM

## 2022-07-31 DIAGNOSIS — Z0001 Encounter for general adult medical examination with abnormal findings: Secondary | ICD-10-CM

## 2022-07-31 DIAGNOSIS — J302 Other seasonal allergic rhinitis: Secondary | ICD-10-CM | POA: Diagnosis not present

## 2022-07-31 DIAGNOSIS — N400 Enlarged prostate without lower urinary tract symptoms: Secondary | ICD-10-CM | POA: Diagnosis not present

## 2022-07-31 DIAGNOSIS — G8929 Other chronic pain: Secondary | ICD-10-CM

## 2022-07-31 DIAGNOSIS — G3184 Mild cognitive impairment, so stated: Secondary | ICD-10-CM

## 2022-07-31 DIAGNOSIS — C9200 Acute myeloblastic leukemia, not having achieved remission: Secondary | ICD-10-CM

## 2022-07-31 DIAGNOSIS — K219 Gastro-esophageal reflux disease without esophagitis: Secondary | ICD-10-CM

## 2022-07-31 DIAGNOSIS — I10 Essential (primary) hypertension: Secondary | ICD-10-CM | POA: Diagnosis not present

## 2022-07-31 DIAGNOSIS — F419 Anxiety disorder, unspecified: Secondary | ICD-10-CM

## 2022-07-31 DIAGNOSIS — G629 Polyneuropathy, unspecified: Secondary | ICD-10-CM

## 2022-07-31 DIAGNOSIS — Z87442 Personal history of urinary calculi: Secondary | ICD-10-CM

## 2022-07-31 MED ORDER — AMLODIPINE BESYLATE 5 MG PO TABS: 5.0000 mg | ORAL_TABLET | Freq: Every day | ORAL | 1 refills | Status: DC

## 2022-07-31 MED ORDER — AZELASTINE HCL 0.1 % NA SOLN: 1.0000 | Freq: Two times a day (BID) | NASAL | 5 refills | Status: DC

## 2022-07-31 MED ORDER — TAMSULOSIN HCL 0.4 MG PO CAPS: 0.4000 mg | ORAL_CAPSULE | Freq: Two times a day (BID) | ORAL | 2 refills | Status: AC

## 2022-07-31 NOTE — Progress Notes (Signed)
New Patient Office Visit  Subjective    Patient ID: Richard Wallace, male    DOB: [birth year]  Age: 77 y.o. MRN: [id number]  CC:  Chief Complaint  Patient presents with   Establish Care    HPI Richard Wallace presents to establish care.  He is a 77 year old male with a past medical history significant for AML currently undergoing chemotherapy, chronic musculoskeletal pain, HTN, mild cognitive impairment, peripheral neuropathy, anxiety, GERD, and BPH.  He is currently followed by oncology (Dr. Delton Coombes) for management of AML and is also followed by neurology (Dr. Krista Blue).  Richard Wallace reports feeling well today.  He endorses nasal congestion but is otherwise asymptomatic.  He has no acute concerns to discuss aside from wishing to establish care.  Chronic medical conditions and outstanding preventative care items discussed today are individually addressed in A/P below.   Outpatient Encounter Medications as of 07/31/2022  Medication Sig   ACETAMINOPHEN EXTRA STRENGTH 500 MG capsule    allopurinol (ZYLOPRIM) 300 MG tablet TAKE 1 TABLET BY MOUTH AT BEDTIME   ALPRAZolam (XANAX) 0.25 MG tablet Take 1 tablet (0.25 mg total) by mouth 2 (two) times daily as needed. for anxiety   amLODipine (NORVASC) 5 MG tablet Take 1 tablet (5 mg total) by mouth daily.   azelastine (ASTELIN) 0.1 % nasal spray Place 1 spray into both nostrils 2 (two) times daily. Use in each nostril as directed   cetirizine (ZYRTEC) 10 MG tablet Take 10 mg by mouth daily. IN THE MORNING   diclofenac Sodium (VOLTAREN) 1 % GEL Apply three times daily to knees as needed for pain   docusate sodium (COLACE) 100 MG capsule Take 100 mg by mouth at bedtime.    donepezil (ARICEPT) 10 MG tablet TAKE 1 TABLET BY MOUTH AT BEDTIME   furosemide (LASIX) 20 MG tablet Take 20 mg by mouth daily as needed for fluid.   gabapentin (NEURONTIN) 300 MG capsule Take 1 capsule (300 mg total) by mouth 3 (three) times daily as needed.   HYDROcodone-acetaminophen  (NORCO) 10-325 MG tablet Take 1 tablet by mouth every 6 (six) hours as needed.   hydrOXYzine (ATARAX) 25 MG tablet Take 1 tablet (25 mg total) by mouth every 6 (six) hours as needed.   ipratropium (ATROVENT) 0.03 % nasal spray Place 2 sprays into both nostrils 3 (three) times daily.   lansoprazole (PREVACID) 15 MG capsule Take 15 mg by mouth daily.    lidocaine-prilocaine (EMLA) cream APPLY CREAM TOPICALLY ONE HOUR PRIOR TO CHEMOTHERAPY APPOINTMENT   magnesium oxide (MAG-OX) 400 (240 Mg) MG tablet TAKE 1 TABLET BY MOUTH THREE TIMES DAILY   oxyCODONE (OXY IR/ROXICODONE) 5 MG immediate release tablet Take 1-2 tablets (5-10 mg total) by mouth every 6 (six) hours as needed.   SF 5000 PLUS 1.1 % CREA dental cream SMARTSIG:Sparingly By Mouth Every Night   venetoclax (VENCLEXTA) 100 MG tablet TAKE 2 TABLETS (200 MG) BY MOUTH DAILY   [DISCONTINUED] amLODipine (NORVASC) 2.5 MG tablet Take 1 tablet by mouth once daily (Patient taking differently: Take 5 mg by mouth daily.)   [DISCONTINUED] tamsulosin (FLOMAX) 0.4 MG CAPS capsule Take 0.4 mg by mouth 2 (two) times daily.   tamsulosin (FLOMAX) 0.4 MG CAPS capsule Take 1 capsule (0.4 mg total) by mouth 2 (two) times daily.   [DISCONTINUED] benzonatate (TESSALON) 200 MG capsule  (Patient not taking: Reported on 07/20/2022)   [DISCONTINUED] guaiFENesin-dextromethorphan (ROBITUSSIN DM) 100-10 MG/5ML syrup Take 5 mLs by mouth every 4 (four) hours as  needed for cough.   [DISCONTINUED] silodosin (RAPAFLO) 8 MG CAPS capsule Take 1 capsule (8 mg total) by mouth at bedtime. (Patient not taking: Reported on 07/20/2022)   [DISCONTINUED] tiZANidine (ZANAFLEX) 2 MG tablet 4 mg. Takes 2 q 8 hrs prn   Facility-Administered Encounter Medications as of 07/31/2022  Medication   octreotide (SANDOSTATIN LAR) 30 MG IM injection   sodium chloride flush (NS) 0.9 % injection 20 mL    Past Medical History:  Diagnosis Date   Arthritis    Atrophy of left kidney    only 7.8%  functioning   Cancer ([place]) 01-28-2014   skin cancer   CKD (chronic kidney disease), stage III (HCC)    GERD (gastroesophageal reflux disease)    Heart murmur    NOTED DURING PHYSICAL WHEN HE WAS ENLISTING IN [facility] , DIDNT KNOW UNTIL THAT TIME AND REPORTS , "THATS THE LAST I HEARD ABOUT IT "    History of hypertension    no longer issue   History of kidney stones    History of malignant melanoma of skin    excision top of scalp 2015-- no recurrence   History of urinary retention    post op lumbar fusion surgery 04/ 2016   Hypertension    Kidney dysfunction    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, MONITORED BY [facility] DR Annie Main DAHLSTEDT    Left ureteral calculus    Seasonal allergies    Wears glasses    Wears glasses    Wears partial dentures    upper and lower    Past Surgical History:  Procedure Laterality Date   ANKLE FUSION Right 2007   CARPAL TUNNEL RELEASE Left 12/28/2009   w/ pulley release left long finger   CARPAL TUNNEL RELEASE Right 07/22/2013   Procedure: RIGHT CARPAL TUNNEL RELEASE;  Surgeon: Cammie Sickle., MD;  Location: [place] [facility];  Service: Orthopedics;  Laterality: Right;   COLONOSCOPY     CYSTO/  LEFT RETROGRADE PYELOGRAM  11/21/2010   CYSTOSCOPY WITH STENT PLACEMENT Left 03/09/2016   Procedure: CYSTOSCOPY WITH STENT PLACEMENT;  Surgeon: Franchot Gallo, MD;  Location: [facility];  Service: Urology;  Laterality: Left;   CYSTOSCOPY/RETROGRADE/URETEROSCOPY/STONE EXTRACTION WITH BASKET Left 03/09/2016   Procedure: CYSTOSCOPY/RETROGRADE/URETEROSCOPY/STONE EXTRACTION WITH BASKET;  Surgeon: Franchot Gallo, MD;  Location: [facility];  Service: Urology;  Laterality: Left;   LEFT URETEROSCOPIC LASER LITHOTRIPSY STONE EXTRACTION/ STENT PLACEMENT  05/23/2010   MOHS SURGERY     TOP OF THE HEAD    ORIF ANKLE FRACTURE Right 1978   PORT-A-CATH REMOVAL Right 02/14/2019   Procedure: MINOR REMOVAL PORT-A-CATH;   Surgeon: Aviva Signs, MD;  Location: [facility];  Service: General;  Laterality: Right;   PORTACATH PLACEMENT Right 08/19/2018   Procedure: INSERTION PORT-A-CATH (attached catheter in right subclavian);  Surgeon: Aviva Signs, MD;  Location: [facility];  Service: General;  Laterality: Right;   PORTACATH PLACEMENT Left 05/16/2019   Procedure: INSERTION PORT-A-CATH (attached catheter in left subclavian);  Surgeon: Aviva Signs, MD;  Location: [facility];  Service: General;  Laterality: Left;   POSTERIOR LUMBAR FUSION  08/21/2014   laminectomy and decompression L2 -- L5   RIGHT LOWER LEG SURGERY  X3  1975 to 1976   including ORIF   TONSILLECTOMY AND ADENOIDECTOMY  [id number]   UMBILICAL HERNIA REPAIR  2009 approx    Family History  Problem Relation Age of Onset   Stroke Mother    Prostate cancer Father  Bone cancer Father    Diverticulitis Father    Rheum arthritis Sister    Urinary tract infection Sister    Colon cancer Neg Hx     Social History   Socioeconomic History   Marital status: Married    Spouse name: Not on file   Number of children: Not on file   Years of education: Not on file   Highest education level: Not on file  Occupational History   Not on file  Tobacco Use   Smoking status: Former    Years: 20    Types: Cigarettes    Quit date: 07/17/1986    Years since quitting: 36.0   Smokeless tobacco: Never  Vaping Use   Vaping Use: Never used  Substance and Sexual Activity   Alcohol use: Yes    Alcohol/week: 7.0 - 14.0 standard drinks of alcohol    Types: 7 - 14 Cans of beer per week    Comment: 1 -2 beer daily   Drug use: No   Sexual activity: Not Currently  Other Topics Concern   Not on file  Social History Narrative   Not on file   Social Determinants of Health   Financial Resource Strain: Low Risk  (03/23/2020)   Overall Financial Resource Strain (CARDIA)    Difficulty of Paying Living Expenses: Not hard at all  Food Insecurity: No Food Insecurity (03/23/2020)    Hunger Vital Sign    Worried About Running Out of Food in the Last Year: Never true    Ran Out of Food in the Last Year: Never true  Transportation Needs: No Transportation Needs (03/23/2020)   PRAPARE - Hydrologist (Medical): No    Lack of Transportation (Non-Medical): No  Physical Activity: Unknown (02/17/2019)   Exercise Vital Sign    Days of Exercise per Week: Patient declined    Minutes of Exercise per Session: Patient declined  Stress: No Stress Concern Present (03/23/2020)   [place]    Feeling of Stress : Not at all  Social Connections: Moderately Isolated (03/23/2020)   Social Connection and Isolation Panel [NHANES]    Frequency of Communication with Friends and Family: Twice a week    Frequency of Social Gatherings with Friends and Family: Never    Attends Religious Services: Never    Marine scientist or Organizations: Yes    Attends Music therapist: More than 4 times per year    Marital Status: Married  Human resources officer Violence: Unknown (02/17/2019)   Humiliation, Afraid, Rape, and Kick questionnaire    Fear of Current or Ex-Partner: Patient declined    Emotionally Abused: Patient declined    Physically Abused: Patient declined    Sexually Abused: Patient declined   Review of Systems  HENT:  Positive for congestion.   All other systems reviewed and are negative.  Objective    BP 120/71   Pulse 71   Ht 5' 9.5" (1.765 m)   Wt 217 lb 9.6 oz (98.7 kg)   SpO2 96%   BMI 31.67 kg/m   Physical Exam Vitals reviewed.  Constitutional:      General: He is not in acute distress.    Appearance: Normal appearance. He is not ill-appearing.  HENT:     Head: Normocephalic and atraumatic.     Right Ear: External ear normal.     Left Ear: External ear normal.     Nose: Nose  normal. No congestion or rhinorrhea.     Mouth/Throat:     Mouth: Mucous membranes  are moist.     Pharynx: Oropharynx is clear.  Eyes:     General: No scleral icterus.    Extraocular Movements: Extraocular movements intact.     Conjunctiva/sclera: Conjunctivae normal.     Pupils: Pupils are equal, round, and reactive to light.  Cardiovascular:     Rate and Rhythm: Normal rate and regular rhythm.     Pulses: Normal pulses.     Heart sounds: Normal heart sounds. No murmur heard. Pulmonary:     Effort: Pulmonary effort is normal.     Breath sounds: Normal breath sounds. No wheezing, rhonchi or rales.  Abdominal:     General: Abdomen is flat. Bowel sounds are normal. There is no distension.     Palpations: Abdomen is soft.     Tenderness: There is no abdominal tenderness.  Musculoskeletal:        General: No swelling or deformity. Normal range of motion.     Cervical back: Normal range of motion.     Right lower leg: Edema present.     Comments: Port in L chest wall  Skin:    General: Skin is warm and dry.     Capillary Refill: Capillary refill takes less than 2 seconds.  Neurological:     General: No focal deficit present.     Mental Status: He is alert and oriented to person, place, and time.     Motor: No weakness.  Psychiatric:        Mood and Affect: Mood normal.        Behavior: Behavior normal.        Thought Content: Thought content normal.    Assessment & Plan:   Problem List Items Addressed This Visit       Essential hypertension - Primary    Currently prescribed amlodipine 5 mg daily for treatment of hypertension.  His blood pressure today is 120/71. -No medication changes today.  Continue current regimen.      GERD (gastroesophageal reflux disease)    Symptoms are currently well-controlled with lansoprazole.  No medication changes today.      Neuropathy    Followed by neurology.  Currently prescribed gabapentin 300 mg twice daily. -No medication changes today      BPH (benign prostatic hyperplasia)    Symptoms are currently  well-controlled with Flomax.  No medication changes today.      Encounter for general adult medical examination with abnormal findings    Presenting today to establish care.  Previous records and labs been reviewed. -Labs from 3/4 reviewed.  No additional labs have been ordered today -Reviewed outstanding vaccines.  He plans to receive these at his pharmacy -We will tentatively plan for follow-up in 3 months      AML (acute myeloblastic leukemia) [facility])    Diagnosed September 2020.  Currently undergoing chemotherapy (decitabine and venetoclax).  Followed by oncology (Dr. Delton Coombes).      Mild cognitive impairment    Followed by neurology.  He is currently prescribed Aricept 10 mg daily. -No medication changes today      Seasonal allergies    He endorses rhinorrhea secondary to seasonal allergies. -Astelin nasal spray prescribed today       Chronic musculoskeletal pain    Currently prescribed oxycodone 5-10 mg as needed every 6 hours for pain relief.  This is managed by oncology.  PDMP reviewed. -No medication changes  today.      History of nephrolithiasis    He endorses a history of nephrolithiasis and is currently prescribed allopurinol.  Followed by urology. -No medication changes today      Anxiety    Prescribed alprazolam 0.25 mg twice daily as needed for anxiety relief.  This is managed by neurology. -No medication changes today      Daytime somnolence    Richard Wallace still drives.  According his wife he occasionally begins to doze off while driving.  With this in mind, I have strongly recommended that he abstain from driving in the future for not only his safety but those around him.  He has expressed understanding of my recommendations.       Return in about 3 months (around 10/31/2022).   Johnette Abraham, MD

## 2022-07-31 NOTE — Patient Instructions (Signed)
It was a pleasure to see you today.  Thank you for giving [place] the opportunity to be involved in your care.  Below is a brief recap of your visit and next steps.  We will plan to see you again in 3 months.  Summary You have established care today We will plan for follow up in 3 months and repeat labs at that time. Astelin nasal spray added for seasonal allergy relief

## 2022-08-06 DIAGNOSIS — Z87442 Personal history of urinary calculi: Secondary | ICD-10-CM | POA: Insufficient documentation

## 2022-08-06 DIAGNOSIS — F419 Anxiety disorder, unspecified: Secondary | ICD-10-CM | POA: Insufficient documentation

## 2022-08-06 DIAGNOSIS — G629 Polyneuropathy, unspecified: Secondary | ICD-10-CM | POA: Insufficient documentation

## 2022-08-06 DIAGNOSIS — R400 Somnolence: Secondary | ICD-10-CM | POA: Insufficient documentation

## 2022-08-06 DIAGNOSIS — K219 Gastro-esophageal reflux disease without esophagitis: Secondary | ICD-10-CM | POA: Insufficient documentation

## 2022-08-06 DIAGNOSIS — J302 Other seasonal allergic rhinitis: Secondary | ICD-10-CM | POA: Insufficient documentation

## 2022-08-06 DIAGNOSIS — N400 Enlarged prostate without lower urinary tract symptoms: Secondary | ICD-10-CM | POA: Insufficient documentation

## 2022-08-06 DIAGNOSIS — M7918 Myalgia, other site: Secondary | ICD-10-CM | POA: Insufficient documentation

## 2022-08-06 DIAGNOSIS — G8929 Other chronic pain: Secondary | ICD-10-CM | POA: Insufficient documentation

## 2022-08-06 NOTE — Assessment & Plan Note (Signed)
Followed by neurology.  He is currently prescribed Aricept 10 mg daily. -No medication changes today

## 2022-08-06 NOTE — Assessment & Plan Note (Signed)
Currently prescribed oxycodone 5-10 mg as needed every 6 hours for pain relief.  This is managed by oncology.  PDMP reviewed. -No medication changes today.

## 2022-08-06 NOTE — Assessment & Plan Note (Signed)
Prescribed alprazolam 0.25 mg twice daily as needed for anxiety relief.  This is managed by neurology. -No medication changes today

## 2022-08-06 NOTE — Assessment & Plan Note (Addendum)
Diagnosed September 2020.  Currently undergoing chemotherapy (decitabine and venetoclax).  Followed by oncology (Dr. Delton Coombes).

## 2022-08-06 NOTE — Assessment & Plan Note (Signed)
Symptoms are currently well-controlled with lansoprazole.  No medication changes today.

## 2022-08-06 NOTE — Assessment & Plan Note (Signed)
He endorses a history of nephrolithiasis and is currently prescribed allopurinol.  Followed by urology. -No medication changes today

## 2022-08-06 NOTE — Assessment & Plan Note (Signed)
[name] still drives.  According his wife he occasionally begins to doze off while driving.  With this in mind, I have strongly recommended that he abstain from driving in the future for not only his safety but those around him.  He has expressed understanding of my recommendations.

## 2022-08-06 NOTE — Assessment & Plan Note (Signed)
Followed by neurology.  Currently prescribed gabapentin 300 mg twice daily. -No medication changes today

## 2022-08-06 NOTE — Assessment & Plan Note (Signed)
He endorses rhinorrhea secondary to seasonal allergies. -Astelin nasal spray prescribed today

## 2022-08-06 NOTE — Assessment & Plan Note (Signed)
Presenting today to establish care.  Previous records and labs been reviewed. -Labs from 3/4 reviewed.  No additional labs have been ordered today -Reviewed outstanding vaccines.  He plans to receive these at his pharmacy -We will tentatively plan for follow-up in 3 months

## 2022-08-06 NOTE — Assessment & Plan Note (Signed)
Currently prescribed amlodipine 5 mg daily for treatment of hypertension.  His blood pressure today is 120/71. -No medication changes today.  Continue current regimen.

## 2022-08-06 NOTE — Assessment & Plan Note (Signed)
Symptoms are currently well-controlled with Flomax.  No medication changes today.

## 2022-08-08 ENCOUNTER — Other Ambulatory Visit: Payer: Self-pay

## 2022-08-16 ENCOUNTER — Other Ambulatory Visit: Payer: Medicare Other

## 2022-08-17 ENCOUNTER — Ambulatory Visit
Admission: RE | Admit: 2022-08-17 | Discharge: 2022-08-17 | Disposition: A | Discharge status: Home / Self Care | Payer: Medicare Other | Source: Ambulatory Visit | Attending: Neurology | Admitting: Neurology

## 2022-08-17 DIAGNOSIS — R410 Disorientation, unspecified: Secondary | ICD-10-CM

## 2022-08-17 DIAGNOSIS — M79674 Pain in right toe(s): Secondary | ICD-10-CM

## 2022-08-17 MED ORDER — GADOPICLENOL 0.5 MMOL/ML IV SOLN
10.0000 mL | Freq: Once | INTRAVENOUS | Status: AC | PRN
  Administered 2022-08-17: 10 mL via INTRAVENOUS

## 2022-08-20 NOTE — Progress Notes (Signed)
[facility] [address] [place] [address]    Clinic Day:  09/01/2022  Referring physician: Doreatha Massed, MD  Patient Care Team: Billie Lade, MD as PCP - General (Internal Medicine) Doreatha Massed, MD as Medical Oncologist (Medical Oncology)   ASSESSMENT & PLAN:   Assessment: 1.  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  -BMBX on 02/25/2019 after cycle 1 did not show any evidence of leukemia. -CT CAP on 02/12/2019 shows numerous bilateral irregular/spiculated pulmonary nodules measuring up to 14 mm, nonspecific.  Hepatic steatosis.  Spleen is normal. -I have talked to Dr.Ravandi at [facility] per patient request.  There are clinical trials available upon progression of his AML.  There are also some clinical trials available now based on MRD positivity.  Patient not interested in moving to [place] at this time.   Plan: 1.  AML: - He denied any infections in the past 6 weeks. - Last treatment was cycle 30 on 07/10/2022. - Labs today: Normal LFTs and creatinine.  CBC was grossly normal.  Platelets are mildly low at 149.  He will proceed with cycle 31 today.  He will take venetoclax for 2 weeks. - He has developed pins and needle sensation in the toes and was started on gabapentin 300 mg at bedtime. - RTC 6 weeks for follow-up.   2.  Arthralgias: - Continue oxycodone 5 mg daily as needed.   3.  Hypomagnesemia: - Continue magnesium 3 times daily.  Magnesium is normal.   4.  Hypertension: - Continue amlodipine 2.5 mg daily.  Blood pressure is normal.   5.  Dementia: - Aricept and Xanax was discontinued.  He feels slightly better with regards to confusion.  6.  Right leg swelling: - Continue Lasix 20 mg daily as needed.  Orders Placed This Encounter  Procedures   CBC with Differential    Standing Status:   Future    Standing Expiration Date:    11/13/2023   Comprehensive metabolic panel    Standing Status:   Future    Standing Expiration Date:   11/13/2023      I,Katie Daubenspeck,acting as a scribe for Doreatha Massed, MD.,have documented all relevant documentation on the behalf of Doreatha Massed, MD,as directed by  Doreatha Massed, MD while in the presence of Doreatha Massed, MD.   I, Doreatha Massed MD, have reviewed the above documentation for accuracy and completeness, and I agree with the above.   Doreatha Massed, MD   4/26/20246:11 PM  CHIEF COMPLAINT:   Diagnosis: AML    Cancer Staging  No matching staging information was found for the patient.   Prior Therapy: none  Current Therapy:  Decitabine every 6 weeks & venetoclax 200 mg x2 weeks after chemo    HISTORY OF PRESENT ILLNESS:   Oncology History  MDS (myelodysplastic syndrome), high grade (HCC)  08/14/2018 Initial Diagnosis   MDS (myelodysplastic syndrome), high grade (HCC)   08/22/2018 - 12/06/2018 Chemotherapy   The patient had palonosetron (ALOXI) injection 0.25 mg, 0.25 mg, Intravenous,  Once, 4 of 6 cycles Administration: 0.25 mg (08/22/2018), 0.25 mg (08/26/2018), 0.25 mg (08/28/2018), 0.25 mg (08/30/2018), 0.25 mg (10/01/2018), 0.25 mg (10/02/2018), 0.25 mg (11/04/2018), 0.25 mg (09/23/2018), 0.25 mg (09/25/2018), 0.25 mg (09/27/2018), 0.25 mg (10/28/2018), 0.25 mg (10/30/2018), 0.25 mg (11/01/2018), 0.25 mg (12/02/2018), 0.25 mg (12/04/2018), 0.25 mg (12/06/2018) azaCITIDine (VIDAZA) 100 mg in sodium chloride 0.9 %  50 mL chemo infusion, 110 mg (66.7 % of original dose 75 mg/m2), Intravenous, Once, 4 of 6 cycles Dose modification: 50 mg/m2 (original dose 75 mg/m2, Cycle 1, Reason: Provider Judgment), 50 mg/m2 (original dose 75 mg/m2, Cycle 2, Reason: Provider Judgment) Administration: 100 mg (08/22/2018), 100 mg (08/23/2018), 100 mg (08/26/2018), 100 mg (08/27/2018), 100 mg (08/28/2018), 100 mg (08/29/2018), 100 mg (08/30/2018), 100 mg (10/01/2018), 100  mg (10/02/2018), 100 mg (11/04/2018), 100 mg (11/05/2018), 100 mg (09/23/2018), 100 mg (09/24/2018), 100 mg (09/25/2018), 100 mg (09/26/2018), 100 mg (09/27/2018), 100 mg (10/28/2018), 100 mg (10/29/2018), 100 mg (10/30/2018), 100 mg (10/31/2018), 100 mg (11/01/2018), 100 mg (12/02/2018), 100 mg (12/03/2018), 100 mg (12/04/2018), 100 mg (12/05/2018), 100 mg (12/06/2018)  for chemotherapy treatment.    01/20/2019 - 12/02/2021 Chemotherapy   Patient is on Treatment Plan : MYELODYSPLASIA Decitabine D1-5 q42d     01/20/2019 -  Chemotherapy   Patient is on Treatment Plan : AML Decitabine D1-5 q 42d     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HCC)  01/07/2019 Initial Diagnosis   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HCC)   01/20/2019 - 12/02/2021 Chemotherapy   Patient is on Treatment Plan : MYELODYSPLASIA Decitabine D1-5 q42d     01/20/2019 -  Chemotherapy   Patient is on Treatment Plan : AML Decitabine D1-5 q 42d        INTERVAL HISTORY:   Leonidas is a 77 y.o. male presenting to clinic today for follow up of AML. He was last seen by me on 07/10/22.  Today, he states that he is doing well overall. His appetite level is at 100%. His energy level is at 25%.  PAST MEDICAL HISTORY:   Past Medical History: 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   Cancer (HCC) 01-28-2014   skin cancer   CKD (chronic kidney disease), stage III (HCC)    GERD (gastroesophageal reflux disease)    Heart murmur    NOTED DURING PHYSICAL WHEN HE WAS ENLISTING IN [place] , DIDNT KNOW UNTIL THAT TIME AND REPORTS , "THATS THE LAST I HEARD ABOUT IT "    History of hypertension    no longer issue   History of kidney stones    History of malignant melanoma of skin    excision top of scalp 2015-- no recurrence   History of urinary retention    post op lumbar fusion surgery 04/ 2016   Hypertension    Kidney dysfunction    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, MONITORED BY [facility] DR  Jeannett Senior DAHLSTEDT    Left ureteral calculus    Seasonal allergies    Wears glasses    Wears glasses    Wears partial dentures    upper and lower    Surgical History: Past Surgical History:  Procedure Laterality Date   ANKLE FUSION Right 2007   CARPAL TUNNEL RELEASE Left 12/28/2009   w/ pulley release left long finger   CARPAL TUNNEL RELEASE Right 07/22/2013   Procedure: RIGHT CARPAL TUNNEL RELEASE;  Surgeon: Wyn Forster., MD;  Location: [facility];  Service: Orthopedics;  Laterality: Right;   COLONOSCOPY     CYSTO/  LEFT RETROGRADE PYELOGRAM  11/21/2010   CYSTOSCOPY WITH STENT PLACEMENT Left 03/09/2016   Procedure: CYSTOSCOPY WITH STENT PLACEMENT;  Surgeon: Marcine Matar, MD;  Location: [facility];  Service: Urology;  Laterality: Left;   CYSTOSCOPY/RETROGRADE/URETEROSCOPY/STONE EXTRACTION WITH BASKET Left  03/09/2016   Procedure: CYSTOSCOPY/RETROGRADE/URETEROSCOPY/STONE EXTRACTION WITH BASKET;  Surgeon: Marcine Matar, MD;  Location: [facility];  Service: Urology;  Laterality: Left;   LEFT URETEROSCOPIC LASER LITHOTRIPSY STONE EXTRACTION/ STENT PLACEMENT  05/23/2010   MOHS SURGERY     TOP OF THE HEAD    ORIF ANKLE FRACTURE Right 1978   PORT-A-CATH REMOVAL Right 02/14/2019   Procedure: MINOR REMOVAL PORT-A-CATH;  Surgeon: Franky Macho, MD;  Location: [facility];  Service: General;  Laterality: Right;   PORTACATH PLACEMENT Right 08/19/2018   Procedure: INSERTION PORT-A-CATH (attached catheter in right subclavian);  Surgeon: Franky Macho, MD;  Location: [facility];  Service: General;  Laterality: Right;   PORTACATH PLACEMENT Left 05/16/2019   Procedure: INSERTION PORT-A-CATH (attached catheter in left subclavian);  Surgeon: Franky Macho, MD;  Location: [facility];  Service: General;  Laterality: Left;   POSTERIOR LUMBAR FUSION  08/21/2014   laminectomy and decompression L2 -- L5   RIGHT LOWER LEG SURGERY  X3  1975 to 1976   including ORIF    TONSILLECTOMY AND ADENOIDECTOMY  1986   UMBILICAL HERNIA REPAIR  2009 approx    Social History: Social History   Socioeconomic History   Marital status: Married    Spouse name: Not on file   Number of children: Not on file   Years of education: Not on file   Highest education level: Not on file  Occupational History   Not on file  Tobacco Use   Smoking status: Former    Years: 20    Types: Cigarettes    Quit date: 07/17/1986    Years since quitting: 36.1   Smokeless tobacco: Never  Vaping Use   Vaping Use: Never used  Substance and Sexual Activity   Alcohol use: Yes    Alcohol/week: 7.0 - 14.0 standard drinks of alcohol    Types: 7 - 14 Cans of beer per week    Comment: 1 -2 beer daily   Drug use: No   Sexual activity: Not Currently  Other Topics Concern   Not on file  Social History Narrative   Not on file   Social Determinants of Health   Financial Resource Strain: Low Risk  (03/23/2020)   Overall Financial Resource Strain (CARDIA)    Difficulty of Paying Living Expenses: Not hard at all  Food Insecurity: No Food Insecurity (03/23/2020)   Hunger Vital Sign    Worried About Running Out of Food in the Last Year: Never true    Ran Out of Food in the Last Year: Never true  Transportation Needs: No Transportation Needs (03/23/2020)   PRAPARE - Administrator, Civil Service (Medical): No    Lack of Transportation (Non-Medical): No  Physical Activity: Unknown (02/17/2019)   Exercise Vital Sign    Days of Exercise per Week: Patient declined    Minutes of Exercise per Session: Patient declined  Stress: No Stress Concern Present (03/23/2020)   [employer] of Occupational Health - Occupational Stress Questionnaire    Feeling of Stress : Not at all  Social Connections: Moderately Isolated (03/23/2020)   Social Connection and Isolation Panel [NHANES]    Frequency of Communication with Friends and Family: Twice a week    Frequency of Social Gatherings  with Friends and Family: Never    Attends Religious Services: Never    Database administrator or Organizations: Yes    Attends Engineer, structural: More than 4 times per year    Marital Status:  Married  Intimate Partner Violence: Unknown (02/17/2019)   Humiliation, Afraid, Rape, and Kick questionnaire    Fear of Current or Ex-Partner: Patient declined    Emotionally Abused: Patient declined    Physically Abused: Patient declined    Sexually Abused: Patient declined    Family History: Family History  Problem Relation Age of Onset   Stroke Mother    Prostate cancer Father    Bone cancer Father    Diverticulitis Father    Rheum arthritis Sister    Urinary tract infection Sister    Colon cancer Neg Hx     Current Medications:  Current Outpatient Medications:    ACETAMINOPHEN EXTRA STRENGTH 500 MG capsule, , Disp: , Rfl:    allopurinol (ZYLOPRIM) 300 MG tablet, TAKE 1 TABLET BY MOUTH AT BEDTIME, Disp: 90 tablet, Rfl: 0   amLODipine (NORVASC) 5 MG tablet, Take 1 tablet (5 mg total) by mouth daily., Disp: 90 tablet, Rfl: 1   azelastine (ASTELIN) 0.1 % nasal spray, Place 1 spray into both nostrils 2 (two) times daily. Use in each nostril as directed, Disp: 30 mL, Rfl: 5   cetirizine (ZYRTEC) 10 MG tablet, Take 10 mg by mouth daily. IN THE MORNING, Disp: , Rfl:    diclofenac Sodium (VOLTAREN) 1 % GEL, Apply three times daily to knees as needed for pain, Disp: 50 g, Rfl: 3   furosemide (LASIX) 20 MG tablet, Take 20 mg by mouth daily as needed for fluid., Disp: , Rfl:    gabapentin (NEURONTIN) 300 MG capsule, Take 1 capsule (300 mg total) by mouth 3 (three) times daily as needed. (Patient taking differently: Take 300 mg by mouth at bedtime. Takes at bedtime), Disp: 90 capsule, Rfl: 11   ipratropium (ATROVENT) 0.03 % nasal spray, Place 2 sprays into both nostrils 3 (three) times daily., Disp: 30 mL, Rfl: 6   lidocaine-prilocaine (EMLA) cream, APPLY CREAM TOPICALLY ONE HOUR PRIOR TO  CHEMOTHERAPY APPOINTMENT, Disp: 30 g, Rfl: 0   magnesium oxide (MAG-OX) 400 (240 Mg) MG tablet, TAKE 1 TABLET BY MOUTH THREE TIMES DAILY, Disp: 90 tablet, Rfl: 3   oxyCODONE (OXY IR/ROXICODONE) 5 MG immediate release tablet, Take 1-2 tablets (5-10 mg total) by mouth every 6 (six) hours as needed., Disp: 120 tablet, Rfl: 0   SF 5000 PLUS 1.1 % CREA dental cream, SMARTSIG:Sparingly By Mouth Every Night, Disp: , Rfl:    tamsulosin (FLOMAX) 0.4 MG CAPS capsule, Take 1 capsule (0.4 mg total) by mouth 2 (two) times daily. (Patient taking differently: Take 0.4 mg by mouth daily. Takes two at night), Disp: 60 capsule, Rfl: 2   venetoclax (VENCLEXTA) 100 MG tablet, TAKE 2 TABLETS (200 MG) BY MOUTH DAILY, Disp: 60 tablet, Rfl: 2 No current facility-administered medications for this visit.  Facility-Administered Medications Ordered in Other Visits:    octreotide (SANDOSTATIN LAR) 30 MG IM injection, , , ,    sodium chloride flush (NS) 0.9 % injection 20 mL, 20 mL, Intravenous, PRN, Doreatha Massed, MD, 20 mL at 07/21/19 1053   Allergies: No Known Allergies  REVIEW OF SYSTEMS:   Review of Systems  Constitutional:  Negative for chills, fatigue and fever.  HENT:   Negative for lump/mass, mouth sores, nosebleeds, sore throat and trouble swallowing.   Eyes:  Negative for eye problems.  Respiratory:  Negative for cough and shortness of breath.   Cardiovascular:  Negative for chest pain, leg swelling and palpitations.  Gastrointestinal:  Negative for abdominal pain, constipation, diarrhea, nausea and vomiting.  Genitourinary:  Negative for bladder incontinence, difficulty urinating, dysuria, frequency, hematuria and nocturia.   Musculoskeletal:  Negative for arthralgias, back pain, flank pain, myalgias and neck pain.  Skin:  Negative for itching and rash.  Neurological:  Positive for numbness. Negative for dizziness and headaches.  Hematological:  Does not bruise/bleed easily.   Psychiatric/Behavioral:  Negative for depression, sleep disturbance and suicidal ideas. The patient is not nervous/anxious.   All other systems reviewed and are negative.    VITALS:   Blood pressure 131/80, pulse 78, temperature 97.7 F (36.5 C), temperature source Tympanic, resp. rate 20, weight 223 lb 12.8 oz (101.5 kg), SpO2 97 %.  Wt Readings from Last 3 Encounters:  08/28/22 223 lb (101.2 kg)  08/21/22 223 lb 12.8 oz (101.5 kg)  07/31/22 217 lb 9.6 oz (98.7 kg)    Body mass index is 32.58 kg/m.  Performance status (ECOG): 1 - Symptomatic but completely ambulatory  PHYSICAL EXAM:   Physical Exam Vitals and nursing note reviewed. Exam conducted with a chaperone present.  Constitutional:      Appearance: Normal appearance.  Cardiovascular:     Rate and Rhythm: Normal rate and regular rhythm.     Pulses: Normal pulses.     Heart sounds: Normal heart sounds.  Pulmonary:     Effort: Pulmonary effort is normal.     Breath sounds: Normal breath sounds.  Abdominal:     Palpations: Abdomen is soft. There is no hepatomegaly, splenomegaly or mass.     Tenderness: There is no abdominal tenderness.  Musculoskeletal:     Right lower leg: No edema.     Left lower leg: No edema.  Lymphadenopathy:     Cervical: No cervical adenopathy.     Right cervical: No superficial, deep or posterior cervical adenopathy.    Left cervical: No superficial, deep or posterior cervical adenopathy.     Upper Body:     Right upper body: No supraclavicular or axillary adenopathy.     Left upper body: No supraclavicular or axillary adenopathy.  Neurological:     General: No focal deficit present.     Mental Status: He is alert and oriented to person, place, and time.  Psychiatric:        Mood and Affect: Mood normal.        Behavior: Behavior normal.     LABS:      Latest Ref Rng & Units 08/21/2022    9:17 AM 07/10/2022    9:49 AM 05/29/2022    8:34 AM  CBC  WBC 4.0 - 10.5 K/uL 4.8  4.8  8.7    Hemoglobin 13.0 - 17.0 g/dL 16.1  09.6  04.5   Hematocrit 39.0 - 52.0 % 47.0  48.7  49.9   Platelets 150 - 400 K/uL 149  155  142       Latest Ref Rng & Units 08/21/2022    9:17 AM 07/10/2022    9:49 AM 05/29/2022    8:34 AM  CMP  Glucose 70 - 99 mg/dL 90  409  811   BUN 8 - 23 mg/dL 21  16  18    Creatinine 0.61 - 1.24 mg/dL 9.14  7.82  9.56   Sodium 135 - 145 mmol/L 139  140  138   Potassium 3.5 - 5.1 mmol/L 3.8  4.3  4.3   Chloride 98 - 111 mmol/L 108  106  105   CO2 22 - 32 mmol/L 25  25  26  Calcium 8.9 - 10.3 mg/dL 9.1  9.3  9.7   Total Protein 6.5 - 8.1 g/dL 6.4  6.5  6.7   Total Bilirubin 0.3 - 1.2 mg/dL 0.5  0.7  0.7   Alkaline Phos 38 - 126 U/L 62  65  63   AST 15 - 41 U/L 25  25  22    ALT 0 - 44 U/L 25  29  27       No results found for: "CEA1", "CEA" / No results found for: "CEA1", "CEA" No results found for: "PSA1" No results found for: "GNF621" No results found for: "CAN125"  Lab Results  Component Value Date   TOTALPROTELP 6.2 07/08/2018   ALBUMINELP 3.8 07/08/2018   A1GS 0.2 07/08/2018   A2GS 0.6 07/08/2018   BETS 1.0 07/08/2018   GAMS 0.6 07/08/2018   MSPIKE Not Observed 07/08/2018   SPEI Comment 07/08/2018   Lab Results  Component Value Date   FERRITIN 429 (H) 07/08/2018   Lab Results  Component Value Date   LDH 154 02/27/2022   LDH 147 11/28/2021   LDH 157 10/17/2021     STUDIES:   MR BRAIN W WO CONTRAST  Result Date: 08/18/2022  [facility] [address], [address] [place], [place] [address] [phone] NEUROIMAGING REPORT STUDY DATE: 08/17/2022 PATIENT NAME: Danon Lograsso DOB: [birth year] MRN: [id number] EXAM: MRI Brain with and without contrast ORDERING CLINICIAN: Levert Feinstein MD, PhD CLINICAL HISTORY: 77 year old man with confusion COMPARISON FILMS: None TECHNIQUE:MRI of the brain with and without contrast was obtained utilizing 5 mm axial slices with T1, T2, T2 flair, SWI and diffusion weighted views.  T1 sagittal, T2 coronal and  postcontrast views in the axial and coronal plane were obtained.  CONTRAST: 10 mL [facility] IMAGING SITE: [facility], [address] [place], [place], [place] FINDINGS: On sagittal images, the spinal cord is imaged caudally to C3-C4 and is normal in caliber.   The contents of the posterior fossa are of normal size and position.   The pituitary gland and optic chiasm appear normal.   Mild generalized cortical atrophy, a little more than typical for age.  There are no abnormal extra-axial collections of fluid.  There are scattered T2/FLAIR hyperintense foci in the subcortical and deep white matter.  One of the foci in the subcortical posterior left frontal lobe has restricted diffusion consistent with a subacute time course.  None of these appear to be acute.  The cerebellum and brainstem appears normal.   The deep gray matter appears normal.      Susceptibility weighted images are normal.   There have been bilateral lens replacements.  Otherwise, the orbits appear normal.   The VIIth/VIIIth nerve complex appears normal.  The mastoid air cells appear normal.  The paranasal sinuses appear normal.  Flow voids are identified within the major intracerebral arteries.  After the infusion of contrast material, a normal enhancement pattern is noted.   This MRI of the brain with and without contrast shows the following: Mild generalized cortical atrophy a little more than typical for age. Scattered T2/FLAIR hyperintense foci in the hemispheres consistent with chronic microvascular ischemic change.  1 focus in the subcortical left frontal lobe has restricted diffusion and could represent an acute to subacute time course. Normal enhancement pattern. INTERPRETING PHYSICIAN: Richard A. Epimenio Foot, MD, PhD, FAAN Certified in  Neuroimaging by [employer]

## 2022-08-21 ENCOUNTER — Inpatient Hospital Stay: Payer: Medicare Other

## 2022-08-21 ENCOUNTER — Inpatient Hospital Stay: Payer: Medicare Other | Attending: Hematology

## 2022-08-21 ENCOUNTER — Other Ambulatory Visit: Payer: Self-pay

## 2022-08-21 ENCOUNTER — Inpatient Hospital Stay (HOSPITAL_BASED_OUTPATIENT_CLINIC_OR_DEPARTMENT_OTHER): Payer: Medicare Other | Admitting: Hematology

## 2022-08-21 ENCOUNTER — Telehealth: Payer: Self-pay | Admitting: Neurology

## 2022-08-21 VITALS — BP 131/80 | HR 78 | Temp 97.7°F | Resp 20 | Wt 223.8 lb

## 2022-08-21 VITALS — BP 133/89 | HR 75 | Temp 97.4°F | Resp 16

## 2022-08-21 DIAGNOSIS — Z79899 Other long term (current) drug therapy: Secondary | ICD-10-CM | POA: Insufficient documentation

## 2022-08-21 DIAGNOSIS — Z95828 Presence of other vascular implants and grafts: Secondary | ICD-10-CM | POA: Diagnosis not present

## 2022-08-21 DIAGNOSIS — C9200 Acute myeloblastic leukemia, not having achieved remission: Secondary | ICD-10-CM

## 2022-08-21 DIAGNOSIS — M7989 Other specified soft tissue disorders: Secondary | ICD-10-CM | POA: Diagnosis not present

## 2022-08-21 DIAGNOSIS — Z5111 Encounter for antineoplastic chemotherapy: Secondary | ICD-10-CM | POA: Diagnosis present

## 2022-08-21 DIAGNOSIS — R918 Other nonspecific abnormal finding of lung field: Secondary | ICD-10-CM | POA: Insufficient documentation

## 2022-08-21 DIAGNOSIS — D46Z Other myelodysplastic syndromes: Secondary | ICD-10-CM

## 2022-08-21 DIAGNOSIS — F0390 Unspecified dementia without behavioral disturbance: Secondary | ICD-10-CM | POA: Insufficient documentation

## 2022-08-21 DIAGNOSIS — N183 Chronic kidney disease, stage 3 unspecified: Secondary | ICD-10-CM | POA: Insufficient documentation

## 2022-08-21 DIAGNOSIS — K760 Fatty (change of) liver, not elsewhere classified: Secondary | ICD-10-CM | POA: Insufficient documentation

## 2022-08-21 DIAGNOSIS — M2550 Pain in unspecified joint: Secondary | ICD-10-CM | POA: Diagnosis not present

## 2022-08-21 DIAGNOSIS — I129 Hypertensive chronic kidney disease with stage 1 through stage 4 chronic kidney disease, or unspecified chronic kidney disease: Secondary | ICD-10-CM | POA: Diagnosis not present

## 2022-08-21 LAB — CBC WITH DIFFERENTIAL/PLATELET
Abs Immature Granulocytes: 0.04 10*3/uL (ref 0.00–0.07)
Basophils Absolute: 0 10*3/uL (ref 0.0–0.1)
Basophils Relative: 1 %
Eosinophils Absolute: 0.1 10*3/uL (ref 0.0–0.5)
Eosinophils Relative: 3 %
HCT: 47 % (ref 39.0–52.0)
Hemoglobin: 15.3 g/dL (ref 13.0–17.0)
Immature Granulocytes: 1 %
Lymphocytes Relative: 25 %
Lymphs Abs: 1.2 10*3/uL (ref 0.7–4.0)
MCH: 30.5 pg (ref 26.0–34.0)
MCHC: 32.6 g/dL (ref 30.0–36.0)
MCV: 93.8 fL (ref 80.0–100.0)
Monocytes Absolute: 0.4 10*3/uL (ref 0.1–1.0)
Monocytes Relative: 8 %
Neutro Abs: 3 10*3/uL (ref 1.7–7.7)
Neutrophils Relative %: 62 %
Platelets: 149 10*3/uL — ABNORMAL LOW (ref 150–400)
RBC: 5.01 MIL/uL (ref 4.22–5.81)
RDW: 16.4 % — ABNORMAL HIGH (ref 11.5–15.5)
WBC: 4.8 10*3/uL (ref 4.0–10.5)
nRBC: 0 % (ref 0.0–0.2)

## 2022-08-21 LAB — COMPREHENSIVE METABOLIC PANEL
ALT: 25 U/L (ref 0–44)
AST: 25 U/L (ref 15–41)
Albumin: 3.9 g/dL (ref 3.5–5.0)
Alkaline Phosphatase: 62 U/L (ref 38–126)
Anion gap: 6 (ref 5–15)
BUN: 21 mg/dL (ref 8–23)
CO2: 25 mmol/L (ref 22–32)
Calcium: 9.1 mg/dL (ref 8.9–10.3)
Chloride: 108 mmol/L (ref 98–111)
Creatinine, Ser: 1.03 mg/dL (ref 0.61–1.24)
GFR, Estimated: >60 mL/min (ref >60)
Glucose, Bld: 90 mg/dL (ref 70–99)
Potassium: 3.8 mmol/L (ref 3.5–5.1)
Sodium: 139 mmol/L (ref 135–145)
Total Bilirubin: 0.5 mg/dL (ref 0.3–1.2)
Total Protein: 6.4 g/dL — ABNORMAL LOW (ref 6.5–8.1)

## 2022-08-21 LAB — MAGNESIUM: Magnesium: 1.8 mg/dL (ref 1.7–2.4)

## 2022-08-21 MED ORDER — SODIUM CHLORIDE 0.9 % IV SOLN: Freq: Once | INTRAVENOUS | Status: AC

## 2022-08-21 MED ORDER — HEPARIN SOD (PORK) LOCK FLUSH 100 UNIT/ML IV SOLN
500.0000 [IU] | Freq: Once | INTRAVENOUS | Status: AC | PRN
  Administered 2022-08-21: 500 [IU]

## 2022-08-21 MED ORDER — SODIUM CHLORIDE 0.9% FLUSH
10.0000 mL | INTRAVENOUS | Status: DC | PRN
  Administered 2022-08-21: 10 mL

## 2022-08-21 MED ORDER — SODIUM CHLORIDE 0.9 % IV SOLN
15.0000 mg/m2 | Freq: Once | INTRAVENOUS | Status: AC
  Administered 2022-08-21: 35 mg via INTRAVENOUS
  Filled 2022-08-21: qty 7

## 2022-08-21 MED ORDER — ONDANSETRON HCL 4 MG/2ML IJ SOLN
4.0000 mg | Freq: Once | INTRAMUSCULAR | Status: AC
  Administered 2022-08-21: 4 mg via INTRAVENOUS
  Filled 2022-08-21: qty 2

## 2022-08-21 NOTE — Patient Instructions (Signed)
[facility] Discharge Instructions   You were seen and examined today by Dr. Katragadda.  He reviewed the results of your lab work which are normal/stable.   We will proceed with your treatment today.  Return as scheduled.    Thank you for choosing [facility] to provide your oncology and hematology care.  To afford each patient quality time with our provider, please arrive at least 15 minutes before your scheduled appointment time.   If you have a lab appointment with the Cancer Center please come in thru the Main Entrance and check in at the main information desk.  You need to re-schedule your appointment should you arrive 10 or more minutes late.  We strive to give you quality time with our providers, and arriving late affects you and other patients whose appointments are after yours.  Also, if you no show three or more times for appointments you may be dismissed from the clinic at the providers discretion.     Again, thank you for choosing [facility].  Our hope is that these requests will decrease the amount of time that you wait before being seen by our physicians.       _____________________________________________________________  Should you have questions after your visit to [facility], please contact our office at [phone] and follow the prompts.  Our office hours are 8:00 a.m. and 4:30 p.m. Monday - Friday.  Please note that voicemails left after 4:00 p.m. may not be returned until the following business day.  We are closed weekends and major holidays.  You do have access to a nurse 24-7, just call the main number to the clinic [phone] and do not press any options, hold on the line and a nurse will answer the phone.    For prescription refill requests, have your pharmacy contact our office and allow 72 hours.    Due to Covid, you will need to wear a mask upon entering  the hospital. If you do not have a mask, a mask will be given to you at the Main Entrance upon arrival. For doctor visits, patients may have 1 support person age 18 or older with them. For treatment visits, patients can not have anyone with them due to social distancing guidelines and our immunocompromised population.      

## 2022-08-21 NOTE — Progress Notes (Signed)
Patient is taking Venetoclax as prescribed.  He has not missed any doses and reports no side effects at this time.    Patient has been examined by Dr. Ellin Saba. Vital signs and labs have been reviewed by MD - ANC, Creatinine, LFTs, hemoglobin, and platelets are within treatment parameters per M.D. - pt may proceed with treatment.  Primary RN and pharmacy notified.

## 2022-08-21 NOTE — Telephone Encounter (Signed)
I have called patient, MRI of the brain showed 1 DWI lesion at the left frontal focus, could represent acute or subacute time course, otherwise small vessel disease, advised him to take aspirin 81 mg daily  Please set up a virtual visit with patient   IMPRESSION: This MRI of the brain with and without contrast shows the following: Mild generalized cortical atrophy a little more than typical for age. Scattered T2/FLAIR hyperintense foci in the hemispheres consistent with chronic microvascular ischemic change.  1 focus in the subcortical left frontal lobe has restricted diffusion and could represent an acute to subacute time course. Normal enhancement pattern.

## 2022-08-21 NOTE — Patient Instructions (Signed)
[facility] [place]  Discharge Instructions: Thank you for choosing [facility] to provide your oncology and hematology care.  If you have a lab appointment with the Cancer Center, please come in thru the Main Entrance and check in at the main information desk.  Wear comfortable clothing and clothing appropriate for easy access to any Portacath or PICC line.   We strive to give you quality time with your provider. You may need to reschedule your appointment if you arrive late (15 or more minutes).  Arriving late affects you and other patients whose appointments are after yours.  Also, if you miss three or more appointments without notifying the office, you may be dismissed from the clinic at the provider's discretion.      For prescription refill requests, have your pharmacy contact our office and allow 72 hours for refills to be completed.    Today you received the following chemotherapy and/or immunotherapy agents Dacogen.  Decitabine Injection What is this medication? DECITABINE (dee SYE ta been) treats blood and bone marrow cancers. It works by slowing down the growth of cancer cells. This medicine may be used for other purposes; ask your health care provider or pharmacist if you have questions. COMMON BRAND NAME(S): Dacogen What should I tell my care team before I take this medication? They need to know if you have any of these conditions: Infection Kidney disease Liver disease An unusual or allergic reaction to decitabine, other medications, foods, dyes, or preservatives Pregnant or trying to get pregnant Breast-feeding How should I use this medication? This medication is infused into a vein. It is given by your care team in a hospital or clinic setting. Talk to your care team about the use of this medication in children. Special care may be needed. Overdosage: If you think you have taken too much of this medicine contact a poison control center or  emergency room at once. NOTE: This medicine is only for you. Do not share this medicine with others. What if I miss a dose? Keep appointments for follow-up doses. It is important not to miss your dose. Call your care team if you are unable to keep an appointment. What may interact with this medication? Interactions are not expected. This list may not describe all possible interactions. Give your health care provider a list of all the medicines, herbs, non-prescription drugs, or dietary supplements you use. Also tell them if you smoke, drink alcohol, or use illegal drugs. Some items may interact with your medicine. What should I watch for while using this medication? Visit your care team for regular checks on your progress. You may need blood work while taking this medication. This medication may make you feel generally unwell. This is not uncommon as chemotherapy can affect healthy cells as well as cancer cells. Report any side effects. Continue your course of treatment even though you feel ill unless your care team tells you to stop. This medication may increase your risk of getting an infection. Call your care team for advice if you get a fever, chills, sore throat, or other symptoms of a cold or flu. Do not treat yourself. Try to avoid being around people who are sick. Be careful brushing or flossing your teeth or using a toothpick because you may get an infection or bleed more easily. If you have any dental work done, tell your dentist you are receiving this medication. Call your care team if you are around anyone with measles, chickenpox, or   if you develop sores or blisters that do not heal properly. Avoid taking medications that contain aspirin, acetaminophen, ibuprofen, naproxen, or ketoprofen unless instructed by your care team. These medications may hide a fever. Talk to your care team if you or your partner wish to become pregnant or think either of you might be pregnant. This medication can  cause serious birth defects if taken during pregnancy or for 6 months after the last dose. A negative pregnancy test is required before starting this medication. A reliable form of contraception is recommended while taking this medication and for 6 months after the last dose. Talk to your care team about reliable forms of contraception. Do not father a child while taking this medication or for 3 months after the last dose. Use a condom while having sex during this time period. Do not breast-feed while taking this medication and for 2 weeks after the last dose. This medication may cause infertility. Talk to your care team if you are concerned about your fertility. What side effects may I notice from receiving this medication? Side effects that you should report to your care team as soon as possible: Allergic reactions--skin rash, itching, hives, swelling of the face, lips, tongue, or throat Infection--fever, chills, cough, sore throat, wounds that don't heal, pain or trouble when passing urine, general feeling of discomfort or being unwell Low red blood cell level--unusual weakness or fatigue, dizziness, headache, trouble breathing Unusual bruising or bleeding Side effects that usually do not require medical attention (report to your care team if they continue or are bothersome): Constipation Diarrhea Fatigue Nausea Pain, redness, or swelling with sores inside the mouth or throat Stomach pain This list may not describe all possible side effects. Call your doctor for medical advice about side effects. You may report side effects to FDA at 1-800-FDA-1088. Where should I keep my medication? This medication is given in a hospital or clinic. It will not be stored at home. NOTE: This sheet is a summary. It may not cover all possible information. If you have questions about this medicine, talk to your doctor, pharmacist, or health care provider.  2023 Elsevier/Gold Standard (2007-06-15 00:00:00)         To help prevent nausea and vomiting after your treatment, we encourage you to take your nausea medication as directed.  BELOW ARE SYMPTOMS THAT SHOULD BE REPORTED IMMEDIATELY: *FEVER GREATER THAN 100.4 F (38 C) OR HIGHER *CHILLS OR SWEATING *NAUSEA AND VOMITING THAT IS NOT CONTROLLED WITH YOUR NAUSEA MEDICATION *UNUSUAL SHORTNESS OF BREATH *UNUSUAL BRUISING OR BLEEDING *URINARY PROBLEMS (pain or burning when urinating, or frequent urination) *BOWEL PROBLEMS (unusual diarrhea, constipation, pain near the anus) TENDERNESS IN MOUTH AND THROAT WITH OR WITHOUT PRESENCE OF ULCERS (sore throat, sores in mouth, or a toothache) UNUSUAL RASH, SWELLING OR PAIN  UNUSUAL VAGINAL DISCHARGE OR ITCHING   Items with * indicate a potential emergency and should be followed up as soon as possible or go to the Emergency Department if any problems should occur.  Please show the CHEMOTHERAPY ALERT CARD or IMMUNOTHERAPY ALERT CARD at check-in to the Emergency Department and triage nurse.  Should you have questions after your visit or need to cancel or reschedule your appointment, please contact [facility] [place] [phone]  and follow the prompts.  Office hours are 8:00 a.m. to 4:30 p.m. Monday - Friday. Please note that voicemails left after 4:00 p.m. may not be returned until the following business day.  We are closed weekends and major   holidays. You have access to a nurse at all times for urgent questions. Please call the main number to the clinic [phone] and follow the prompts.  For any non-urgent questions, you may also contact your provider using MyChart. We now offer e-Visits for anyone 18 and older to request care online for non-urgent symptoms. For details visit [email or web address].   Also download the MyChart app! Go to the app store, search "MyChart", open the app, select [place], and log in with your MyChart username and password.   

## 2022-08-22 ENCOUNTER — Inpatient Hospital Stay: Payer: Medicare Other

## 2022-08-22 VITALS — BP 138/87 | HR 79 | Temp 97.6°F | Resp 18

## 2022-08-22 DIAGNOSIS — C9200 Acute myeloblastic leukemia, not having achieved remission: Secondary | ICD-10-CM

## 2022-08-22 DIAGNOSIS — Z5111 Encounter for antineoplastic chemotherapy: Secondary | ICD-10-CM | POA: Diagnosis not present

## 2022-08-22 DIAGNOSIS — D46Z Other myelodysplastic syndromes: Secondary | ICD-10-CM

## 2022-08-22 DIAGNOSIS — Z95828 Presence of other vascular implants and grafts: Secondary | ICD-10-CM

## 2022-08-22 MED ORDER — ONDANSETRON HCL 4 MG/2ML IJ SOLN
4.0000 mg | Freq: Once | INTRAMUSCULAR | Status: AC
  Administered 2022-08-22: 4 mg via INTRAVENOUS
  Filled 2022-08-22: qty 2

## 2022-08-22 MED ORDER — SODIUM CHLORIDE 0.9 % IV SOLN: Freq: Once | INTRAVENOUS | Status: AC

## 2022-08-22 MED ORDER — SODIUM CHLORIDE 0.9 % IV SOLN
15.0000 mg/m2 | Freq: Once | INTRAVENOUS | Status: AC
  Administered 2022-08-22: 35 mg via INTRAVENOUS
  Filled 2022-08-22: qty 7

## 2022-08-22 MED ORDER — HEPARIN SOD (PORK) LOCK FLUSH 100 UNIT/ML IV SOLN
500.0000 [IU] | Freq: Once | INTRAVENOUS | Status: AC | PRN
  Administered 2022-08-22: 500 [IU]

## 2022-08-22 MED ORDER — SODIUM CHLORIDE 0.9% FLUSH
10.0000 mL | INTRAVENOUS | Status: DC | PRN
  Administered 2022-08-22 (×2): 10 mL

## 2022-08-22 NOTE — Patient Instructions (Signed)
[facility]  Discharge Instructions: Thank you for choosing [facility] to provide your oncology and hematology care.  If you have a lab appointment with the Cancer Center - please note that after April 8th, 2024, all labs will be drawn in the cancer center.  You do not have to check in or register with the main entrance as you have in the past but will complete your check-in in the cancer center.  Wear comfortable clothing and clothing appropriate for easy access to any Portacath or PICC line.   We strive to give you quality time with your provider. You may need to reschedule your appointment if you arrive late (15 or more minutes).  Arriving late affects you and other patients whose appointments are after yours.  Also, if you miss three or more appointments without notifying the office, you may be dismissed from the clinic at the provider's discretion.      For prescription refill requests, have your pharmacy contact our office and allow 72 hours for refills to be completed.    Today you received the following chemotherapy and/or immunotherapy agents Decitabine      To help prevent nausea and vomiting after your treatment, we encourage you to take your nausea medication as directed.  BELOW ARE SYMPTOMS THAT SHOULD BE REPORTED IMMEDIATELY: *FEVER GREATER THAN 100.4 F (38 C) OR HIGHER *CHILLS OR SWEATING *NAUSEA AND VOMITING THAT IS NOT CONTROLLED WITH YOUR NAUSEA MEDICATION *UNUSUAL SHORTNESS OF BREATH *UNUSUAL BRUISING OR BLEEDING *URINARY PROBLEMS (pain or burning when urinating, or frequent urination) *BOWEL PROBLEMS (unusual diarrhea, constipation, pain near the anus) TENDERNESS IN MOUTH AND THROAT WITH OR WITHOUT PRESENCE OF ULCERS (sore throat, sores in mouth, or a toothache) UNUSUAL RASH, SWELLING OR PAIN  UNUSUAL VAGINAL DISCHARGE OR ITCHING   Items with * indicate a potential emergency and should be followed up as soon as possible or go to the  Emergency Department if any problems should occur.  Please show the CHEMOTHERAPY ALERT CARD or IMMUNOTHERAPY ALERT CARD at check-in to the Emergency Department and triage nurse.  Should you have questions after your visit or need to cancel or reschedule your appointment, please contact [facility] [phone]  and follow the prompts.  Office hours are 8:00 a.m. to 4:30 p.m. Monday - Friday. Please note that voicemails left after 4:00 p.m. may not be returned until the following business day.  We are closed weekends and major holidays. You have access to a nurse at all times for urgent questions. Please call the main number to the clinic [phone] and follow the prompts.  For any non-urgent questions, you may also contact your provider using MyChart. We now offer e-Visits for anyone 14 and older to request care online for non-urgent symptoms. For details visit [email or web address].   Also download the MyChart app! Go to the app store, search "MyChart", open the app, select , and log in with your MyChart username and password.

## 2022-08-22 NOTE — Progress Notes (Signed)
Patient presents today for Decitabine infusion per providers order.  Vital signs within parameters for treatment.  Patient has no new complaints at this time.  Treatment given today per MD orders.  Stable during infusion without adverse affects.  Vital signs stable.  No complaints at this time.  Discharge from clinic ambulatory in stable condition.  Alert and oriented X 3.  Follow up with [facility] as scheduled.  

## 2022-08-23 ENCOUNTER — Inpatient Hospital Stay: Payer: Medicare Other

## 2022-08-23 VITALS — BP 136/86 | HR 71 | Temp 97.5°F | Resp 18

## 2022-08-23 DIAGNOSIS — C9200 Acute myeloblastic leukemia, not having achieved remission: Secondary | ICD-10-CM

## 2022-08-23 DIAGNOSIS — Z5111 Encounter for antineoplastic chemotherapy: Secondary | ICD-10-CM | POA: Diagnosis not present

## 2022-08-23 DIAGNOSIS — Z95828 Presence of other vascular implants and grafts: Secondary | ICD-10-CM

## 2022-08-23 DIAGNOSIS — D46Z Other myelodysplastic syndromes: Secondary | ICD-10-CM

## 2022-08-23 MED ORDER — SODIUM CHLORIDE 0.9% FLUSH
10.0000 mL | INTRAVENOUS | Status: DC | PRN
  Administered 2022-08-23: 10 mL

## 2022-08-23 MED ORDER — ONDANSETRON HCL 4 MG/2ML IJ SOLN
4.0000 mg | Freq: Once | INTRAMUSCULAR | Status: AC
  Administered 2022-08-23: 4 mg via INTRAVENOUS
  Filled 2022-08-23: qty 2

## 2022-08-23 MED ORDER — SODIUM CHLORIDE 0.9 % IV SOLN
15.0000 mg/m2 | Freq: Once | INTRAVENOUS | Status: AC
  Administered 2022-08-23: 35 mg via INTRAVENOUS
  Filled 2022-08-23: qty 7

## 2022-08-23 MED ORDER — HEPARIN SOD (PORK) LOCK FLUSH 100 UNIT/ML IV SOLN
500.0000 [IU] | Freq: Once | INTRAVENOUS | Status: AC | PRN
  Administered 2022-08-23: 500 [IU]

## 2022-08-23 MED ORDER — SODIUM CHLORIDE 0.9 % IV SOLN: Freq: Once | INTRAVENOUS | Status: AC

## 2022-08-23 NOTE — Progress Notes (Addendum)
Patient presents today for D3 Decitabine infusion.  Patient is in satisfactory condition with no new complaints voiced.  Vital signs are stable.  Labs reviewed and all labs are within treatment parameters.  We will proceed with treatment per MD orders.    D3 Decitabine given today per MD orders. Tolerated infusion without adverse affects. Vital signs stable. No complaints at this time. Discharged from clinic via motorized wheelchair in stable condition. Alert and oriented x 3. F/U with [facility] as scheduled.

## 2022-08-23 NOTE — Patient Instructions (Signed)
[facility] [place]  Discharge Instructions: Thank you for choosing [facility] to provide your oncology and hematology care.  If you have a lab appointment with the Cancer Center - please note that after April 8th, 2024, all labs will be drawn in the cancer center.  You do not have to check in or register with the main entrance as you have in the past but will complete your check-in in the cancer center.  Wear comfortable clothing and clothing appropriate for easy access to any Portacath or PICC line.   We strive to give you quality time with your provider. You may need to reschedule your appointment if you arrive late (15 or more minutes).  Arriving late affects you and other patients whose appointments are after yours.  Also, if you miss three or more appointments without notifying the office, you may be dismissed from the clinic at the provider's discretion.      For prescription refill requests, have your pharmacy contact our office and allow 72 hours for refills to be completed.    Today you received the following chemotherapy and/or immunotherapy agents D2 Decitabine   To help prevent nausea and vomiting after your treatment, we encourage you to take your nausea medication as directed.  Decitabine Injection What is this medication? DECITABINE (dee SYE ta been) treats blood and bone marrow cancers. It works by slowing down the growth of cancer cells. This medicine may be used for other purposes; ask your health care provider or pharmacist if you have questions. COMMON BRAND NAME(S): Dacogen What should I tell my care team before I take this medication? They need to know if you have any of these conditions: Infection Kidney disease Liver disease An unusual or allergic reaction to decitabine, other medications, foods, dyes, or preservatives Pregnant or trying to get pregnant Breast-feeding How should I use this medication? This medication is infused into a  vein. It is given by your care team in a hospital or clinic setting. Talk to your care team about the use of this medication in children. Special care may be needed. Overdosage: If you think you have taken too much of this medicine contact a poison control center or emergency room at once. NOTE: This medicine is only for you. Do not share this medicine with others. What if I miss a dose? Keep appointments for follow-up doses. It is important not to miss your dose. Call your care team if you are unable to keep an appointment. What may interact with this medication? Interactions are not expected. This list may not describe all possible interactions. Give your health care provider a list of all the medicines, herbs, non-prescription drugs, or dietary supplements you use. Also tell them if you smoke, drink alcohol, or use illegal drugs. Some items may interact with your medicine. What should I watch for while using this medication? Visit your care team for regular checks on your progress. You may need blood work while taking this medication. This medication may make you feel generally unwell. This is not uncommon as chemotherapy can affect healthy cells as well as cancer cells. Report any side effects. Continue your course of treatment even though you feel ill unless your care team tells you to stop. This medication may increase your risk of getting an infection. Call your care team for advice if you get a fever, chills, sore throat, or other symptoms of a cold or flu. Do not treat yourself. Try to avoid being around  people who are sick. Be careful brushing or flossing your teeth or using a toothpick because you may get an infection or bleed more easily. If you have any dental work done, tell your dentist you are receiving this medication. Call your care team if you are around anyone with measles, chickenpox, or if you develop sores or blisters that do not heal properly. Avoid taking medications that  contain aspirin, acetaminophen, ibuprofen, naproxen, or ketoprofen unless instructed by your care team. These medications may hide a fever. Talk to your care team if you or your partner wish to become pregnant or think either of you might be pregnant. This medication can cause serious birth defects if taken during pregnancy or for 6 months after the last dose. A negative pregnancy test is required before starting this medication. A reliable form of contraception is recommended while taking this medication and for 6 months after the last dose. Talk to your care team about reliable forms of contraception. Do not father a child while taking this medication or for 3 months after the last dose. Use a condom while having sex during this time period. Do not breast-feed while taking this medication and for 2 weeks after the last dose. This medication may cause infertility. Talk to your care team if you are concerned about your fertility. What side effects may I notice from receiving this medication? Side effects that you should report to your care team as soon as possible: Allergic reactions--skin rash, itching, hives, swelling of the face, lips, tongue, or throat Infection--fever, chills, cough, sore throat, wounds that don't heal, pain or trouble when passing urine, general feeling of discomfort or being unwell Low red blood cell level--unusual weakness or fatigue, dizziness, headache, trouble breathing Unusual bruising or bleeding Side effects that usually do not require medical attention (report to your care team if they continue or are bothersome): Constipation Diarrhea Fatigue Nausea Pain, redness, or swelling with sores inside the mouth or throat Stomach pain This list may not describe all possible side effects. Call your doctor for medical advice about side effects. You may report side effects to FDA at 1-800-FDA-1088. Where should I keep my medication? This medication is given in a hospital or  clinic. It will not be stored at home. NOTE: This sheet is a summary. It may not cover all possible information. If you have questions about this medicine, talk to your doctor, pharmacist, or health care provider.  2023 Elsevier/Gold Standard (2007-06-15 00:00:00)   BELOW ARE SYMPTOMS THAT SHOULD BE REPORTED IMMEDIATELY: *FEVER GREATER THAN 100.4 F (38 C) OR HIGHER *CHILLS OR SWEATING *NAUSEA AND VOMITING THAT IS NOT CONTROLLED WITH YOUR NAUSEA MEDICATION *UNUSUAL SHORTNESS OF BREATH *UNUSUAL BRUISING OR BLEEDING *URINARY PROBLEMS (pain or burning when urinating, or frequent urination) *BOWEL PROBLEMS (unusual diarrhea, constipation, pain near the anus) TENDERNESS IN MOUTH AND THROAT WITH OR WITHOUT PRESENCE OF ULCERS (sore throat, sores in mouth, or a toothache) UNUSUAL RASH, SWELLING OR PAIN  UNUSUAL VAGINAL DISCHARGE OR ITCHING   Items with * indicate a potential emergency and should be followed up as soon as possible or go to the Emergency Department if any problems should occur.  Please show the CHEMOTHERAPY ALERT CARD or IMMUNOTHERAPY ALERT CARD at check-in to the Emergency Department and triage nurse.  Should you have questions after your visit or need to cancel or reschedule your appointment, please contact [facility] [phone]  and follow the prompts.  Office hours are 8:00 a.m. to 4:30  p.m. Monday - Friday. Please note that voicemails left after 4:00 p.m. may not be returned until the following business day.  We are closed weekends and major holidays. You have access to a nurse at all times for urgent questions. Please call the main number to the clinic [phone] and follow the prompts.  For any non-urgent questions, you may also contact your provider using MyChart. We now offer e-Visits for anyone 34 and older to request care online for non-urgent symptoms. For details visit [email or web address].   Also download the MyChart app! Go to the app  store, search "MyChart", open the app, select [place], and log in with your MyChart username and password.

## 2022-08-24 ENCOUNTER — Inpatient Hospital Stay: Payer: Medicare Other

## 2022-08-24 VITALS — BP 140/82 | HR 67 | Temp 98.5°F | Resp 16

## 2022-08-24 DIAGNOSIS — C9200 Acute myeloblastic leukemia, not having achieved remission: Secondary | ICD-10-CM

## 2022-08-24 DIAGNOSIS — D46Z Other myelodysplastic syndromes: Secondary | ICD-10-CM

## 2022-08-24 DIAGNOSIS — Z5111 Encounter for antineoplastic chemotherapy: Secondary | ICD-10-CM | POA: Diagnosis not present

## 2022-08-24 DIAGNOSIS — Z95828 Presence of other vascular implants and grafts: Secondary | ICD-10-CM

## 2022-08-24 MED ORDER — ONDANSETRON HCL 4 MG/2ML IJ SOLN
4.0000 mg | Freq: Once | INTRAMUSCULAR | Status: AC
  Administered 2022-08-24: 4 mg via INTRAVENOUS
  Filled 2022-08-24: qty 2

## 2022-08-24 MED ORDER — SODIUM CHLORIDE 0.9% FLUSH
10.0000 mL | INTRAVENOUS | Status: DC | PRN
  Administered 2022-08-24: 10 mL

## 2022-08-24 MED ORDER — HEPARIN SOD (PORK) LOCK FLUSH 100 UNIT/ML IV SOLN
500.0000 [IU] | Freq: Once | INTRAVENOUS | Status: AC | PRN
  Administered 2022-08-24: 500 [IU]

## 2022-08-24 MED ORDER — SODIUM CHLORIDE 0.9 % IV SOLN
15.0000 mg/m2 | Freq: Once | INTRAVENOUS | Status: AC
  Administered 2022-08-24: 35 mg via INTRAVENOUS
  Filled 2022-08-24: qty 7

## 2022-08-24 MED ORDER — SODIUM CHLORIDE 0.9 % IV SOLN: Freq: Once | INTRAVENOUS | Status: AC

## 2022-08-24 NOTE — Patient Instructions (Signed)
[facility] [place]  Discharge Instructions: Thank you for choosing [facility] to provide your oncology and hematology care.  If you have a lab appointment with the Cancer Center - please note that after April 8th, 2024, all labs will be drawn in the cancer center.  You do not have to check in or register with the main entrance as you have in the past but will complete your check-in in the cancer center.  Wear comfortable clothing and clothing appropriate for easy access to any Portacath or PICC line.   We strive to give you quality time with your provider. You may need to reschedule your appointment if you arrive late (15 or more minutes).  Arriving late affects you and other patients whose appointments are after yours.  Also, if you miss three or more appointments without notifying the office, you may be dismissed from the clinic at the provider's discretion.      For prescription refill requests, have your pharmacy contact our office and allow 72 hours for refills to be completed.    Today you received the following chemotherapy and/or immunotherapy agents dacitabine   To help prevent nausea and vomiting after your treatment, we encourage you to take your nausea medication as directed.  BELOW ARE SYMPTOMS THAT SHOULD BE REPORTED IMMEDIATELY: *FEVER GREATER THAN 100.4 F (38 C) OR HIGHER *CHILLS OR SWEATING *NAUSEA AND VOMITING THAT IS NOT CONTROLLED WITH YOUR NAUSEA MEDICATION *UNUSUAL SHORTNESS OF BREATH *UNUSUAL BRUISING OR BLEEDING *URINARY PROBLEMS (pain or burning when urinating, or frequent urination) *BOWEL PROBLEMS (unusual diarrhea, constipation, pain near the anus) TENDERNESS IN MOUTH AND THROAT WITH OR WITHOUT PRESENCE OF ULCERS (sore throat, sores in mouth, or a toothache) UNUSUAL RASH, SWELLING OR PAIN  UNUSUAL VAGINAL DISCHARGE OR ITCHING   Items with * indicate a potential emergency and should be followed up as soon as possible or go to the  Emergency Department if any problems should occur.  Please show the CHEMOTHERAPY ALERT CARD or IMMUNOTHERAPY ALERT CARD at check-in to the Emergency Department and triage nurse.  Should you have questions after your visit or need to cancel or reschedule your appointment, please contact [facility] CENTER AT [facility] [phone]  and follow the prompts.  Office hours are 8:00 a.m. to 4:30 p.m. Monday - Friday. Please note that voicemails left after 4:00 p.m. may not be returned until the following business day.  We are closed weekends and major holidays. You have access to a nurse at all times for urgent questions. Please call the main number to the clinic [phone] and follow the prompts.  For any non-urgent questions, you may also contact your provider using MyChart. We now offer e-Visits for anyone 31 and older to request care online for non-urgent symptoms. For details visit [email or web address].   Also download the MyChart app! Go to the app store, search "MyChart", open the app, select [place], and log in with your MyChart username and password.

## 2022-08-24 NOTE — Progress Notes (Signed)
Treatment given per orders. Patient tolerated it well without problems. Vitals stable and discharged home from clinic via patient's own wheelchair. Follow up as scheduled.

## 2022-08-25 ENCOUNTER — Inpatient Hospital Stay: Payer: Medicare Other

## 2022-08-25 VITALS — BP 146/85 | HR 76 | Temp 96.9°F | Resp 16

## 2022-08-25 DIAGNOSIS — Z95828 Presence of other vascular implants and grafts: Secondary | ICD-10-CM

## 2022-08-25 DIAGNOSIS — D46Z Other myelodysplastic syndromes: Secondary | ICD-10-CM

## 2022-08-25 DIAGNOSIS — Z5111 Encounter for antineoplastic chemotherapy: Secondary | ICD-10-CM | POA: Diagnosis not present

## 2022-08-25 DIAGNOSIS — C9200 Acute myeloblastic leukemia, not having achieved remission: Secondary | ICD-10-CM

## 2022-08-25 MED ORDER — HEPARIN SOD (PORK) LOCK FLUSH 100 UNIT/ML IV SOLN
500.0000 [IU] | Freq: Once | INTRAVENOUS | Status: AC | PRN
  Administered 2022-08-25: 500 [IU]

## 2022-08-25 MED ORDER — SODIUM CHLORIDE 0.9% FLUSH
10.0000 mL | INTRAVENOUS | Status: DC | PRN
  Administered 2022-08-25: 10 mL

## 2022-08-25 MED ORDER — SODIUM CHLORIDE 0.9 % IV SOLN
15.0000 mg/m2 | Freq: Once | INTRAVENOUS | Status: AC
  Administered 2022-08-25: 35 mg via INTRAVENOUS
  Filled 2022-08-25: qty 7

## 2022-08-25 MED ORDER — SODIUM CHLORIDE 0.9 % IV SOLN: Freq: Once | INTRAVENOUS | Status: AC

## 2022-08-25 MED ORDER — ONDANSETRON HCL 4 MG/2ML IJ SOLN
4.0000 mg | Freq: Once | INTRAMUSCULAR | Status: AC
  Administered 2022-08-25: 4 mg via INTRAVENOUS
  Filled 2022-08-25: qty 2

## 2022-08-25 NOTE — Progress Notes (Signed)
Patient presents today for Decitabine infusion per providers order.  Vital signs within parameters for treatment.  Patient has no new complaints at this time.  Treatment given today per MD orders.  Stable during infusion without adverse affects.  Vital signs stable.  No complaints at this time.  Discharge from clinic ambulatory in stable condition.  Alert and oriented X 3.  Follow up with [facility] as scheduled.  

## 2022-08-25 NOTE — Progress Notes (Signed)
Patient discharged via wheelchair to self in stable condition. He has his follow up appointments.

## 2022-08-25 NOTE — Patient Instructions (Signed)
[facility]  Discharge Instructions: Thank you for choosing [facility] to provide your oncology and hematology care.  If you have a lab appointment with the Cancer Center - please note that after April 8th, 2024, all labs will be drawn in the cancer center.  You do not have to check in or register with the main entrance as you have in the past but will complete your check-in in the cancer center.  Wear comfortable clothing and clothing appropriate for easy access to any Portacath or PICC line.   We strive to give you quality time with your provider. You may need to reschedule your appointment if you arrive late (15 or more minutes).  Arriving late affects you and other patients whose appointments are after yours.  Also, if you miss three or more appointments without notifying the office, you may be dismissed from the clinic at the provider's discretion.      For prescription refill requests, have your pharmacy contact our office and allow 72 hours for refills to be completed.    Today you received the following chemotherapy and/or immunotherapy agents Decitiabine      To help prevent nausea and vomiting after your treatment, we encourage you to take your nausea medication as directed.  BELOW ARE SYMPTOMS THAT SHOULD BE REPORTED IMMEDIATELY: *FEVER GREATER THAN 100.4 F (38 C) OR HIGHER *CHILLS OR SWEATING *NAUSEA AND VOMITING THAT IS NOT CONTROLLED WITH YOUR NAUSEA MEDICATION *UNUSUAL SHORTNESS OF BREATH *UNUSUAL BRUISING OR BLEEDING *URINARY PROBLEMS (pain or burning when urinating, or frequent urination) *BOWEL PROBLEMS (unusual diarrhea, constipation, pain near the anus) TENDERNESS IN MOUTH AND THROAT WITH OR WITHOUT PRESENCE OF ULCERS (sore throat, sores in mouth, or a toothache) UNUSUAL RASH, SWELLING OR PAIN  UNUSUAL VAGINAL DISCHARGE OR ITCHING   Items with * indicate a potential emergency and should be followed up as soon as possible or go to the  Emergency Department if any problems should occur.  Please show the CHEMOTHERAPY ALERT CARD or IMMUNOTHERAPY ALERT CARD at check-in to the Emergency Department and triage nurse.  Should you have questions after your visit or need to cancel or reschedule your appointment, please contact [facility] CENTER AT [facility] [phone]  and follow the prompts.  Office hours are 8:00 a.m. to 4:30 p.m. Monday - Friday. Please note that voicemails left after 4:00 p.m. may not be returned until the following business day.  We are closed weekends and major holidays. You have access to a nurse at all times for urgent questions. Please call the main number to the clinic [phone] and follow the prompts.  For any non-urgent questions, you may also contact your provider using MyChart. We now offer e-Visits for anyone 35 and older to request care online for non-urgent symptoms. For details visit [email or web address].   Also download the MyChart app! Go to the app store, search "MyChart", open the app, select [facility], and log in with your MyChart username and password.

## 2022-08-28 ENCOUNTER — Ambulatory Visit: Payer: Medicare Other | Admitting: Urology

## 2022-08-28 ENCOUNTER — Telehealth (INDEPENDENT_AMBULATORY_CARE_PROVIDER_SITE_OTHER): Payer: Medicare Other | Admitting: Neurology

## 2022-08-28 VITALS — Ht 69.5 in | Wt 223.0 lb

## 2022-08-28 DIAGNOSIS — R410 Disorientation, unspecified: Secondary | ICD-10-CM | POA: Diagnosis not present

## 2022-08-28 DIAGNOSIS — G3184 Mild cognitive impairment, so stated: Secondary | ICD-10-CM
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
## 2022-08-30 DIAGNOSIS — R410 Disorientation, unspecified: Secondary | ICD-10-CM | POA: Insufficient documentation

## 2022-09-01 ENCOUNTER — Encounter: Payer: Self-pay | Admitting: Hematology

## 2022-09-08 ENCOUNTER — Telehealth: Payer: Self-pay | Admitting: Internal Medicine

## 2022-09-08 DIAGNOSIS — N401 Enlarged prostate with lower urinary tract symptoms: Secondary | ICD-10-CM

## 2022-09-08 MED ORDER — FINASTERIDE 5 MG PO TABS
5.0000 mg | ORAL_TABLET | Freq: Every day | ORAL | 2 refills | Status: DC
Start: 2022-09-08 — End: 2022-10-31

## 2022-09-08 NOTE — Telephone Encounter (Signed)
Patient aware.

## 2022-09-08 NOTE — Telephone Encounter (Signed)
Patient called asked can he take bid instead of one day. Call back # [phone]  tamsulosin (FLOMAX) 0.4 MG CAPS capsule [[id number]]

## 2022-09-15 ENCOUNTER — Other Ambulatory Visit: Payer: Self-pay

## 2022-09-19 ENCOUNTER — Other Ambulatory Visit (HOSPITAL_COMMUNITY): Payer: Self-pay | Admitting: Hematology

## 2022-09-19 ENCOUNTER — Other Ambulatory Visit (HOSPITAL_COMMUNITY): Payer: Self-pay

## 2022-09-19 DIAGNOSIS — C9200 Acute myeloblastic leukemia, not having achieved remission: Secondary | ICD-10-CM

## 2022-09-19 MED ORDER — VENETOCLAX 100 MG PO TABS
ORAL_TABLET | ORAL | 2 refills | Status: DC
Start: 2022-09-19 — End: 2023-01-05
  Filled 2022-09-19: qty 60, 30d supply, fill #0
  Filled 2022-09-20: qty 56, 28d supply, fill #0

## 2022-09-20 ENCOUNTER — Other Ambulatory Visit: Payer: Self-pay

## 2022-09-20 ENCOUNTER — Other Ambulatory Visit (HOSPITAL_COMMUNITY): Payer: Self-pay

## 2022-09-21 ENCOUNTER — Other Ambulatory Visit: Payer: Self-pay

## 2022-10-06 ENCOUNTER — Other Ambulatory Visit: Payer: Self-pay

## 2022-10-06 DIAGNOSIS — D46Z Other myelodysplastic syndromes: Secondary | ICD-10-CM

## 2022-10-09 ENCOUNTER — Inpatient Hospital Stay (HOSPITAL_BASED_OUTPATIENT_CLINIC_OR_DEPARTMENT_OTHER): Payer: Medicare Other | Admitting: Hematology

## 2022-10-09 ENCOUNTER — Inpatient Hospital Stay: Payer: Medicare Other

## 2022-10-09 ENCOUNTER — Inpatient Hospital Stay: Payer: Medicare Other | Attending: Hematology

## 2022-10-09 VITALS — BP 138/85 | HR 67 | Temp 96.5°F | Resp 18

## 2022-10-09 VITALS — BP 130/75 | HR 68 | Temp 97.8°F | Resp 16 | Wt 225.5 lb

## 2022-10-09 DIAGNOSIS — F0390 Unspecified dementia without behavioral disturbance: Secondary | ICD-10-CM | POA: Diagnosis not present

## 2022-10-09 DIAGNOSIS — Z5111 Encounter for antineoplastic chemotherapy: Secondary | ICD-10-CM | POA: Diagnosis not present

## 2022-10-09 DIAGNOSIS — Z79899 Other long term (current) drug therapy: Secondary | ICD-10-CM | POA: Insufficient documentation

## 2022-10-09 DIAGNOSIS — Z95828 Presence of other vascular implants and grafts: Secondary | ICD-10-CM | POA: Diagnosis not present

## 2022-10-09 DIAGNOSIS — C9200 Acute myeloblastic leukemia, not having achieved remission: Secondary | ICD-10-CM

## 2022-10-09 DIAGNOSIS — N183 Chronic kidney disease, stage 3 unspecified: Secondary | ICD-10-CM | POA: Diagnosis not present

## 2022-10-09 DIAGNOSIS — I129 Hypertensive chronic kidney disease with stage 1 through stage 4 chronic kidney disease, or unspecified chronic kidney disease: Secondary | ICD-10-CM | POA: Diagnosis not present

## 2022-10-09 DIAGNOSIS — R918 Other nonspecific abnormal finding of lung field: Secondary | ICD-10-CM | POA: Diagnosis not present

## 2022-10-09 DIAGNOSIS — D46Z Other myelodysplastic syndromes: Secondary | ICD-10-CM

## 2022-10-09 LAB — COMPREHENSIVE METABOLIC PANEL
ALT: 27 U/L (ref 0–44)
AST: 22 U/L (ref 15–41)
Albumin: 3.9 g/dL (ref 3.5–5.0)
Alkaline Phosphatase: 62 U/L (ref 38–126)
Anion gap: 10 (ref 5–15)
BUN: 26 mg/dL — ABNORMAL HIGH (ref 8–23)
CO2: 25 mmol/L (ref 22–32)
Calcium: 9.2 mg/dL (ref 8.9–10.3)
Chloride: 103 mmol/L (ref 98–111)
Creatinine, Ser: 0.96 mg/dL (ref 0.61–1.24)
GFR, Estimated: >60 mL/min (ref >60)
Glucose, Bld: 96 mg/dL (ref 70–99)
Potassium: 4.1 mmol/L (ref 3.5–5.1)
Sodium: 138 mmol/L (ref 135–145)
Total Bilirubin: 0.5 mg/dL (ref 0.3–1.2)
Total Protein: 6.4 g/dL — ABNORMAL LOW (ref 6.5–8.1)

## 2022-10-09 LAB — CBC WITH DIFFERENTIAL/PLATELET
Abs Immature Granulocytes: 0.1 10*3/uL — ABNORMAL HIGH (ref 0.00–0.07)
Basophils Absolute: 0.1 10*3/uL (ref 0.0–0.1)
Basophils Relative: 1 %
Eosinophils Absolute: 0.4 10*3/uL (ref 0.0–0.5)
Eosinophils Relative: 5 %
HCT: 46.6 % (ref 39.0–52.0)
Hemoglobin: 15.3 g/dL (ref 13.0–17.0)
Immature Granulocytes: 1 %
Lymphocytes Relative: 20 %
Lymphs Abs: 1.5 10*3/uL (ref 0.7–4.0)
MCH: 31.1 pg (ref 26.0–34.0)
MCHC: 32.8 g/dL (ref 30.0–36.0)
MCV: 94.7 fL (ref 80.0–100.0)
Monocytes Absolute: 0.6 10*3/uL (ref 0.1–1.0)
Monocytes Relative: 8 %
Neutro Abs: 4.7 10*3/uL (ref 1.7–7.7)
Neutrophils Relative %: 65 %
Platelets: 138 10*3/uL — ABNORMAL LOW (ref 150–400)
RBC: 4.92 MIL/uL (ref 4.22–5.81)
RDW: 15.7 % — ABNORMAL HIGH (ref 11.5–15.5)
WBC: 7.3 10*3/uL (ref 4.0–10.5)
nRBC: 0 % (ref 0.0–0.2)

## 2022-10-09 LAB — MAGNESIUM: Magnesium: 1.8 mg/dL (ref 1.7–2.4)

## 2022-10-09 MED ORDER — SODIUM CHLORIDE 0.9% FLUSH
10.0000 mL | INTRAVENOUS | Status: DC | PRN
  Administered 2022-10-09: 10 mL

## 2022-10-09 MED ORDER — HEPARIN SOD (PORK) LOCK FLUSH 100 UNIT/ML IV SOLN
500.0000 [IU] | Freq: Once | INTRAVENOUS | Status: AC | PRN
  Administered 2022-10-09: 500 [IU]

## 2022-10-09 MED ORDER — SODIUM CHLORIDE 0.9 % IV SOLN: Freq: Once | INTRAVENOUS | Status: AC

## 2022-10-09 MED ORDER — SODIUM CHLORIDE 0.9 % IV SOLN
15.0000 mg/m2 | Freq: Once | INTRAVENOUS | Status: AC
  Administered 2022-10-09: 35 mg via INTRAVENOUS
  Filled 2022-10-09: qty 7

## 2022-10-09 MED ORDER — ONDANSETRON HCL 4 MG/2ML IJ SOLN
4.0000 mg | Freq: Once | INTRAMUSCULAR | Status: AC
  Administered 2022-10-09: 4 mg via INTRAVENOUS
  Filled 2022-10-09: qty 2

## 2022-10-09 NOTE — Patient Instructions (Signed)
[facility] at [facility] Discharge Instructions   You were seen and examined today by Dr. Katragadda.  He reviewed the results of your lab work which are normal/stable.   We will proceed with your treatment today.  Return as scheduled.    Thank you for choosing [facility] at [facility] to provide your oncology and hematology care.  To afford each patient quality time with our provider, please arrive at least 15 minutes before your scheduled appointment time.   If you have a lab appointment with the Cancer Center please come in thru the Main Entrance and check in at the main information desk.  You need to re-schedule your appointment should you arrive 10 or more minutes late.  We strive to give you quality time with our providers, and arriving late affects you and other patients whose appointments are after yours.  Also, if you no show three or more times for appointments you may be dismissed from the clinic at the providers discretion.     Again, thank you for choosing [facility].  Our hope is that these requests will decrease the amount of time that you wait before being seen by our physicians.       _____________________________________________________________  Should you have questions after your visit to [facility], please contact our office at [phone] and follow the prompts.  Our office hours are 8:00 a.m. and 4:30 p.m. Monday - Friday.  Please note that voicemails left after 4:00 p.m. may not be returned until the following business day.  We are closed weekends and major holidays.  You do have access to a nurse 24-7, just call the main number to the clinic [phone] and do not press any options, hold on the line and a nurse will answer the phone.    For prescription refill requests, have your pharmacy contact our office and allow 72 hours.    Due to Covid, you will need to wear a mask upon entering  the hospital. If you do not have a mask, a mask will be given to you at the Main Entrance upon arrival. For doctor visits, patients may have 1 support person age 18 or older with them. For treatment visits, patients can not have anyone with them due to social distancing guidelines and our immunocompromised population.      

## 2022-10-09 NOTE — Progress Notes (Signed)
Patient has been examined by Dr. Katragadda. Vital signs and labs have been reviewed by MD - ANC, Creatinine, LFTs, hemoglobin, and platelets are within treatment parameters per M.D. - pt may proceed with treatment.  Primary RN and pharmacy notified.  

## 2022-10-09 NOTE — Progress Notes (Signed)
Patient presents today for D1 Decitabine infusion. Patient is in satisfactory condition with no new complaints voiced.  Vital signs are stable. Labs reviewed by Dr. Ellin Saba during the office visit and all labs are within treatment parameters.  We will proceed with treatment per MD orders.   D1 Decitabine given today per MD orders. Tolerated infusion without adverse affects. Vital signs stable. No complaints at this time. Discharged from clinic via motorized wheelchair in stable condition. Alert and oriented x 3. F/U with [facility] as scheduled.

## 2022-10-09 NOTE — Patient Instructions (Signed)
[facility]  Discharge Instructions: Thank you for choosing [facility] to provide your oncology and hematology care.  If you have a lab appointment with the Cancer Center - please note that after April 8th, 2024, all labs will be drawn in the cancer center.  You do not have to check in or register with the main entrance as you have in the past but will complete your check-in in the cancer center.  Wear comfortable clothing and clothing appropriate for easy access to any Portacath or PICC line.   We strive to give you quality time with your provider. You may need to reschedule your appointment if you arrive late (15 or more minutes).  Arriving late affects you and other patients whose appointments are after yours.  Also, if you miss three or more appointments without notifying the office, you may be dismissed from the clinic at the provider's discretion.      For prescription refill requests, have your pharmacy contact our office and allow 72 hours for refills to be completed.    Today you received the following chemotherapy and/or immunotherapy agents D1 Dectiabine    To help prevent nausea and vomiting after your treatment, we encourage you to take your nausea medication as directed.  BELOW ARE SYMPTOMS THAT SHOULD BE REPORTED IMMEDIATELY: *FEVER GREATER THAN 100.4 F (38 C) OR HIGHER *CHILLS OR SWEATING *NAUSEA AND VOMITING THAT IS NOT CONTROLLED WITH YOUR NAUSEA MEDICATION *UNUSUAL SHORTNESS OF BREATH *UNUSUAL BRUISING OR BLEEDING *URINARY PROBLEMS (pain or burning when urinating, or frequent urination) *BOWEL PROBLEMS (unusual diarrhea, constipation, pain near the anus) TENDERNESS IN MOUTH AND THROAT WITH OR WITHOUT PRESENCE OF ULCERS (sore throat, sores in mouth, or a toothache) UNUSUAL RASH, SWELLING OR PAIN  UNUSUAL VAGINAL DISCHARGE OR ITCHING   Items with * indicate a potential emergency and should be followed up as soon as possible or go to the  Emergency Department if any problems should occur.  Please show the CHEMOTHERAPY ALERT CARD or IMMUNOTHERAPY ALERT CARD at check-in to the Emergency Department and triage nurse.  Should you have questions after your visit or need to cancel or reschedule your appointment, please contact [facility] CENTER AT [facility] [phone]  and follow the prompts.  Office hours are 8:00 a.m. to 4:30 p.m. Monday - Friday. Please note that voicemails left after 4:00 p.m. may not be returned until the following business day.  We are closed weekends and major holidays. You have access to a nurse at all times for urgent questions. Please call the main number to the clinic [phone] and follow the prompts.  For any non-urgent questions, you may also contact your provider using MyChart. We now offer e-Visits for anyone 47 and older to request care online for non-urgent symptoms. For details visit [email or web address].   Also download the MyChart app! Go to the app store, search "MyChart", open the app, select [place], and log in with your MyChart username and password.

## 2022-10-09 NOTE — Progress Notes (Signed)
[facility] [address] [place] [address]    Clinic Day:  10/09/2022  Referring physician: Doreatha Massed, MD  Patient Care Team: Billie Lade, MD as PCP - General (Internal Medicine) Doreatha Massed, MD as Medical Oncologist (Medical Oncology)   ASSESSMENT & PLAN:   Assessment: 1.  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  -BMBX on 02/25/2019 after cycle 1 did not show any evidence of leukemia. -CT CAP on 02/12/2019 shows numerous bilateral irregular/spiculated pulmonary nodules measuring up to 14 mm, nonspecific.  Hepatic steatosis.  Spleen is normal. -I have talked to Dr.Ravandi at [facility] per patient request.  There are clinical trials available upon progression of his AML.  There are also some clinical trials available now based on MRD positivity.  Patient not interested in moving to [place] at this time.    Plan: 1.  AML: - He has tolerated last treatment very well. - Denied any infections in the past 6 weeks. - Labs today: Normal LFTs.  White count is normal.  Platelet count is mildly low.  Differential was normal. - Proceed with cycle 32 of azacitidine.  Take venetoclax 200 mg for 14 days. - RTC 6 weeks for follow-up.   2.  Arthralgias: - Continue oxycodone 5 mg daily as needed.   3.  Hypomagnesemia: - Continue magnesium 3 times daily.  Magnesium is normal.   4.  Hypertension: - Continue amlodipine 2.5 mg daily.   5.  Dementia: - Aricept and Xanax were discontinued.  He felt slightly better with regards to confusion. - Continue gabapentin 200 mg - 300 mg at bedtime.  6.  Right leg swelling: - Continue Lasix 20 mg daily as needed.    Orders Placed This Encounter  Procedures   CBC with Differential    Standing Status:   Future    Standing Expiration Date:   01/01/2024   Comprehensive metabolic panel    Standing Status:   Future     Standing Expiration Date:   01/01/2024   CBC with Differential    Standing Status:   Future    Standing Expiration Date:   02/12/2024   Comprehensive metabolic panel    Standing Status:   Future    Standing Expiration Date:   02/12/2024      I,Katie Daubenspeck,acting as a scribe for Doreatha Massed, MD.,have documented all relevant documentation on the behalf of Doreatha Massed, MD,as directed by  Doreatha Massed, MD while in the presence of Doreatha Massed, MD.   I, Doreatha Massed MD, have reviewed the above documentation for accuracy and completeness, and I agree with the above.   Doreatha Massed, MD   6/3/20246:09 PM  CHIEF COMPLAINT:   Diagnosis: AML    Cancer Staging  No matching staging information was found for the patient.   Prior Therapy: none  Current Therapy:  Decitabine every 6 weeks & venetoclax 200 mg x2 weeks after chemo    HISTORY OF PRESENT ILLNESS:   Oncology History  MDS (myelodysplastic syndrome), high grade (HCC)  08/14/2018 Initial Diagnosis   MDS (myelodysplastic syndrome), high grade (HCC)   08/22/2018 - 12/06/2018 Chemotherapy   The patient had palonosetron (ALOXI) injection 0.25 mg, 0.25 mg, Intravenous,  Once, 4 of 6 cycles Administration: 0.25 mg (08/22/2018), 0.25 mg (08/26/2018), 0.25 mg (08/28/2018), 0.25 mg (08/30/2018), 0.25 mg (10/01/2018), 0.25 mg (10/02/2018), 0.25 mg (11/04/2018), 0.25 mg (09/23/2018), 0.25  mg (09/25/2018), 0.25 mg (09/27/2018), 0.25 mg (10/28/2018), 0.25 mg (10/30/2018), 0.25 mg (11/01/2018), 0.25 mg (12/02/2018), 0.25 mg (12/04/2018), 0.25 mg (12/06/2018) azaCITIDine (VIDAZA) 100 mg in sodium chloride 0.9 % 50 mL chemo infusion, 110 mg (66.7 % of original dose 75 mg/m2), Intravenous, Once, 4 of 6 cycles Dose modification: 50 mg/m2 (original dose 75 mg/m2, Cycle 1, Reason: Provider Judgment), 50 mg/m2 (original dose 75 mg/m2, Cycle 2, Reason: Provider Judgment) Administration: 100 mg (08/22/2018), 100 mg  (08/23/2018), 100 mg (08/26/2018), 100 mg (08/27/2018), 100 mg (08/28/2018), 100 mg (08/29/2018), 100 mg (08/30/2018), 100 mg (10/01/2018), 100 mg (10/02/2018), 100 mg (11/04/2018), 100 mg (11/05/2018), 100 mg (09/23/2018), 100 mg (09/24/2018), 100 mg (09/25/2018), 100 mg (09/26/2018), 100 mg (09/27/2018), 100 mg (10/28/2018), 100 mg (10/29/2018), 100 mg (10/30/2018), 100 mg (10/31/2018), 100 mg (11/01/2018), 100 mg (12/02/2018), 100 mg (12/03/2018), 100 mg (12/04/2018), 100 mg (12/05/2018), 100 mg (12/06/2018)  for chemotherapy treatment.    01/20/2019 - 12/02/2021 Chemotherapy   Patient is on Treatment Plan : MYELODYSPLASIA Decitabine D1-5 q42d     01/20/2019 -  Chemotherapy   Patient is on Treatment Plan : AML Decitabine D1-5 q 42d     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HCC)  01/07/2019 Initial Diagnosis   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HCC)   01/20/2019 - 12/02/2021 Chemotherapy   Patient is on Treatment Plan : MYELODYSPLASIA Decitabine D1-5 q42d     01/20/2019 -  Chemotherapy   Patient is on Treatment Plan : AML Decitabine D1-5 q 42d        INTERVAL HISTORY:   Richard Wallace is a 77 y.o. male presenting to clinic today for follow up of AML. He was last seen by me on 08/21/22.  Today, he states that he is doing well overall. His appetite level is at 100%. His energy level is at 75%.  PAST MEDICAL HISTORY:   Past Medical History: 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   Cancer (HCC) 01-28-2014   skin cancer   CKD (chronic kidney disease), stage III (HCC)    GERD (gastroesophageal reflux disease)    Heart murmur    NOTED DURING PHYSICAL WHEN HE WAS ENLISTING IN [place] , DIDNT KNOW UNTIL THAT TIME AND REPORTS , "THATS THE LAST I HEARD ABOUT IT "    History of hypertension    no longer issue   History of kidney stones    History of malignant melanoma of skin    excision top of scalp 2015-- no recurrence   History of urinary retention    post op  lumbar fusion surgery 04/ 2016   Hypertension    Kidney dysfunction    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, MONITORED BY [facility] DR Jeannett Senior DAHLSTEDT    Left ureteral calculus    Seasonal allergies    Wears glasses    Wears glasses    Wears partial dentures    upper and lower    Surgical History: Past Surgical History:  Procedure Laterality Date   ANKLE FUSION Right 2007   CARPAL TUNNEL RELEASE Left 12/28/2009   w/ pulley release left long finger   CARPAL TUNNEL RELEASE Right 07/22/2013   Procedure: RIGHT CARPAL TUNNEL RELEASE;  Surgeon: Wyn Forster., MD;  Location: [facility];  Service: Orthopedics;  Laterality: Right;   COLONOSCOPY     CYSTO/  LEFT RETROGRADE PYELOGRAM  11/21/2010   CYSTOSCOPY WITH STENT PLACEMENT Left  03/09/2016   Procedure: CYSTOSCOPY WITH STENT PLACEMENT;  Surgeon: Marcine Matar, MD;  Location[facility];  Service: Urology;  Laterality: Left;   CYSTOSCOPY/RETROGRADE/URETEROSCOPY/STONE EXTRACTION WITH BASKET Left 03/09/2016   Procedure: CYSTOSCOPY/RETROGRADE/URETEROSCOPY/STONE EXTRACTION WITH BASKET;  Surgeon: Marcine Matar, MD;  Location: [facility];  Service: Urology;  Laterality: Left;   LEFT URETEROSCOPIC LASER LITHOTRIPSY STONE EXTRACTION/ STENT PLACEMENT  05/23/2010   MOHS SURGERY     TOP OF THE HEAD    ORIF ANKLE FRACTURE Right 1978   PORT-A-CATH REMOVAL Right 02/14/2019   Procedure: MINOR REMOVAL PORT-A-CATH;  Surgeon: Franky Macho, MD;  Location: [facility];  Service: General;  Laterality: Right;   PORTACATH PLACEMENT Right 08/19/2018   Procedure: INSERTION PORT-A-CATH (attached catheter in right subclavian);  Surgeon: Franky Macho, MD;  Location: [facility];  Service: General;  Laterality: Right;   PORTACATH PLACEMENT Left 05/16/2019   Procedure: INSERTION PORT-A-CATH (attached catheter in left subclavian);  Surgeon: Franky Macho, MD;  Location: [facility];  Service: General;  Laterality: Left;    POSTERIOR LUMBAR FUSION  08/21/2014   laminectomy and decompression L2 -- L5   RIGHT LOWER LEG SURGERY  X3  1975 to 1976   including ORIF   TONSILLECTOMY AND ADENOIDECTOMY  1986   UMBILICAL HERNIA REPAIR  2009 approx    Social History: Social History   Socioeconomic History   Marital status: Married    Spouse name: Not on file   Number of children: Not on file   Years of education: Not on file   Highest education level: Not on file  Occupational History   Not on file  Tobacco Use   Smoking status: Former    Years: 20    Types: Cigarettes    Quit date: 07/17/1986    Years since quitting: 36.2   Smokeless tobacco: Never  Vaping Use   Vaping Use: Never used  Substance and Sexual Activity   Alcohol use: Yes    Alcohol/week: 7.0 - 14.0 standard drinks of alcohol    Types: 7 - 14 Cans of beer per week    Comment: 1 -2 beer daily   Drug use: No   Sexual activity: Not Currently  Other Topics Concern   Not on file  Social History Narrative   Not on file   Social Determinants of Health   Financial Resource Strain: Low Risk  (03/23/2020)   Overall Financial Resource Strain (CARDIA)    Difficulty of Paying Living Expenses: Not hard at all  Food Insecurity: No Food Insecurity (03/23/2020)   Hunger Vital Sign    Worried About Running Out of Food in the Last Year: Never true    Ran Out of Food in the Last Year: Never true  Transportation Needs: No Transportation Needs (03/23/2020)   PRAPARE - Administrator, Civil Service (Medical): No    Lack of Transportation (Non-Medical): No  Physical Activity: Unknown (02/17/2019)   Exercise Vital Sign    Days of Exercise per Week: Patient declined    Minutes of Exercise per Session: Patient declined  Stress: No Stress Concern Present (03/23/2020)   [employer] of Occupational Health - Occupational Stress Questionnaire    Feeling of Stress : Not at all  Social Connections: Moderately Isolated (03/23/2020)   Social  Connection and Isolation Panel [NHANES]    Frequency of Communication with Friends and Family: Twice a week    Frequency of Social Gatherings with Friends and Family: Never    Attends  Religious Services: Never    Active Member of Clubs or Organizations: Yes    Attends Banker Meetings: More than 4 times per year    Marital Status: Married  Intimate Partner Violence: Unknown (02/17/2019)   Humiliation, Afraid, Rape, and Kick questionnaire    Fear of Current or Ex-Partner: Patient declined    Emotionally Abused: Patient declined    Physically Abused: Patient declined    Sexually Abused: Patient declined    Family History: Family History  Problem Relation Age of Onset   Stroke Mother    Prostate cancer Father    Bone cancer Father    Diverticulitis Father    Rheum arthritis Sister    Urinary tract infection Sister    Colon cancer Neg Hx     Current Medications:  Current Outpatient Medications:    ACETAMINOPHEN EXTRA STRENGTH 500 MG capsule, , Disp: , Rfl:    allopurinol (ZYLOPRIM) 300 MG tablet, TAKE 1 TABLET BY MOUTH AT BEDTIME, Disp: 90 tablet, Rfl: 0   amLODipine (NORVASC) 5 MG tablet, Take 1 tablet (5 mg total) by mouth daily., Disp: 90 tablet, Rfl: 1   azelastine (ASTELIN) 0.1 % nasal spray, Place 1 spray into both nostrils 2 (two) times daily. Use in each nostril as directed, Disp: 30 mL, Rfl: 5   cetirizine (ZYRTEC) 10 MG tablet, Take 10 mg by mouth daily. IN THE MORNING, Disp: , Rfl:    diclofenac Sodium (VOLTAREN) 1 % GEL, Apply three times daily to knees as needed for pain, Disp: 50 g, Rfl: 3   finasteride (PROSCAR) 5 MG tablet, Take 1 tablet (5 mg total) by mouth daily., Disp: 30 tablet, Rfl: 2   furosemide (LASIX) 20 MG tablet, Take 20 mg by mouth daily as needed for fluid., Disp: , Rfl:    gabapentin (NEURONTIN) 300 MG capsule, Take 1 capsule (300 mg total) by mouth 3 (three) times daily as needed. (Patient taking differently: Take 300 mg by mouth at  bedtime. Takes at bedtime), Disp: 90 capsule, Rfl: 11   ipratropium (ATROVENT) 0.03 % nasal spray, Place 2 sprays into both nostrils 3 (three) times daily., Disp: 30 mL, Rfl: 6   lidocaine-prilocaine (EMLA) cream, APPLY CREAM TOPICALLY ONE HOUR PRIOR TO CHEMOTHERAPY APPOINTMENT, Disp: 30 g, Rfl: 0   magnesium oxide (MAG-OX) 400 (240 Mg) MG tablet, TAKE 1 TABLET BY MOUTH THREE TIMES DAILY, Disp: 90 tablet, Rfl: 3   oxyCODONE (OXY IR/ROXICODONE) 5 MG immediate release tablet, Take 1-2 tablets (5-10 mg total) by mouth every 6 (six) hours as needed., Disp: 120 tablet, Rfl: 0   SF 5000 PLUS 1.1 % CREA dental cream, SMARTSIG:Sparingly By Mouth Every Night, Disp: , Rfl:    tamsulosin (FLOMAX) 0.4 MG CAPS capsule, Take 1 capsule (0.4 mg total) by mouth 2 (two) times daily. (Patient taking differently: Take 0.4 mg by mouth daily. Takes two at night), Disp: 60 capsule, Rfl: 2   venetoclax (VENCLEXTA) 100 MG tablet, TAKE 2 TABLETS (200 MG) BY MOUTH DAILY, Disp: 60 tablet, Rfl: 2 No current facility-administered medications for this visit.  Facility-Administered Medications Ordered in Other Visits:    octreotide (SANDOSTATIN LAR) 30 MG IM injection, , , ,    sodium chloride flush (NS) 0.9 % injection 10 mL, 10 mL, Intracatheter, PRN, Doreatha Massed, MD, 10 mL at 10/09/22 1346   sodium chloride flush (NS) 0.9 % injection 20 mL, 20 mL, Intravenous, PRN, Doreatha Massed, MD, 20 mL at 07/21/19 1053   Allergies: No Known  Allergies  REVIEW OF SYSTEMS:   Review of Systems  Neurological:  Positive for numbness.  All other systems reviewed and are negative.    VITALS:   Blood pressure 130/75, pulse 68, temperature 97.8 F (36.6 C), temperature source Tympanic, resp. rate 16, weight 225 lb 8 oz (102.3 kg), SpO2 96 %.  Wt Readings from Last 3 Encounters:  10/09/22 225 lb 8 oz (102.3 kg)  08/28/22 223 lb (101.2 kg)  08/21/22 223 lb 12.8 oz (101.5 kg)    Body mass index is 32.82  kg/m.  Performance status (ECOG): 1 - Symptomatic but completely ambulatory  PHYSICAL EXAM:   Physical Exam Vitals and nursing note reviewed. Exam conducted with a chaperone present.  Constitutional:      Appearance: Normal appearance.  Cardiovascular:     Rate and Rhythm: Normal rate and regular rhythm.     Pulses: Normal pulses.     Heart sounds: Normal heart sounds.  Pulmonary:     Effort: Pulmonary effort is normal.     Breath sounds: Normal breath sounds.  Abdominal:     Palpations: Abdomen is soft. There is no hepatomegaly, splenomegaly or mass.     Tenderness: There is no abdominal tenderness.  Musculoskeletal:     Right lower leg: No edema.     Left lower leg: No edema.  Lymphadenopathy:     Cervical: No cervical adenopathy.     Right cervical: No superficial, deep or posterior cervical adenopathy.    Left cervical: No superficial, deep or posterior cervical adenopathy.     Upper Body:     Right upper body: No supraclavicular or axillary adenopathy.     Left upper body: No supraclavicular or axillary adenopathy.  Neurological:     General: No focal deficit present.     Mental Status: He is alert and oriented to person, place, and time.  Psychiatric:        Mood and Affect: Mood normal.        Behavior: Behavior normal.     LABS:      Latest Ref Rng & Units 10/09/2022    9:37 AM 08/21/2022    9:17 AM 07/10/2022    9:49 AM  CBC  WBC 4.0 - 10.5 K/uL 7.3  4.8  4.8   Hemoglobin 13.0 - 17.0 g/dL 16.1  09.6  04.5   Hematocrit 39.0 - 52.0 % 46.6  47.0  48.7   Platelets 150 - 400 K/uL 138  149  155       Latest Ref Rng & Units 10/09/2022    9:37 AM 08/21/2022    9:17 AM 07/10/2022    9:49 AM  CMP  Glucose 70 - 99 mg/dL 96  90  409   BUN 8 - 23 mg/dL 26  21  16    Creatinine 0.61 - 1.24 mg/dL 8.11  9.14  7.82   Sodium 135 - 145 mmol/L 138  139  140   Potassium 3.5 - 5.1 mmol/L 4.1  3.8  4.3   Chloride 98 - 111 mmol/L 103  108  106   CO2 22 - 32 mmol/L 25  25  25     Calcium 8.9 - 10.3 mg/dL 9.2  9.1  9.3   Total Protein 6.5 - 8.1 g/dL 6.4  6.4  6.5   Total Bilirubin 0.3 - 1.2 mg/dL 0.5  0.5  0.7   Alkaline Phos 38 - 126 U/L 62  62  65   AST 15 - 41  U/L 22  25  25    ALT 0 - 44 U/L 27  25  29       No results found for: "CEA1", "CEA" / No results found for: "CEA1", "CEA" No results found for: "PSA1" No results found for: "MVH846" No results found for: "CAN125"  Lab Results  Component Value Date   TOTALPROTELP 6.2 07/08/2018   ALBUMINELP 3.8 07/08/2018   A1GS 0.2 07/08/2018   A2GS 0.6 07/08/2018   BETS 1.0 07/08/2018   GAMS 0.6 07/08/2018   MSPIKE Not Observed 07/08/2018   SPEI Comment 07/08/2018   Lab Results  Component Value Date   FERRITIN 429 (H) 07/08/2018   Lab Results  Component Value Date   LDH 154 02/27/2022   LDH 147 11/28/2021   LDH 157 10/17/2021     STUDIES:   No results found.

## 2022-10-10 ENCOUNTER — Inpatient Hospital Stay: Payer: Medicare Other

## 2022-10-10 VITALS — BP 135/80 | HR 70 | Temp 97.4°F | Resp 18

## 2022-10-10 DIAGNOSIS — Z5111 Encounter for antineoplastic chemotherapy: Secondary | ICD-10-CM | POA: Diagnosis not present

## 2022-10-10 DIAGNOSIS — C9200 Acute myeloblastic leukemia, not having achieved remission: Secondary | ICD-10-CM

## 2022-10-10 DIAGNOSIS — D46Z Other myelodysplastic syndromes: Secondary | ICD-10-CM

## 2022-10-10 DIAGNOSIS — Z95828 Presence of other vascular implants and grafts: Secondary | ICD-10-CM

## 2022-10-10 MED ORDER — SODIUM CHLORIDE 0.9 % IV SOLN
15.0000 mg/m2 | Freq: Once | INTRAVENOUS | Status: AC
  Administered 2022-10-10: 35 mg via INTRAVENOUS
  Filled 2022-10-10: qty 7

## 2022-10-10 MED ORDER — SODIUM CHLORIDE 0.9% FLUSH
10.0000 mL | INTRAVENOUS | Status: DC | PRN
  Administered 2022-10-10: 10 mL

## 2022-10-10 MED ORDER — ONDANSETRON HCL 4 MG/2ML IJ SOLN
4.0000 mg | Freq: Once | INTRAMUSCULAR | Status: AC
  Administered 2022-10-10: 4 mg via INTRAVENOUS
  Filled 2022-10-10: qty 2

## 2022-10-10 MED ORDER — SODIUM CHLORIDE 0.9 % IV SOLN: Freq: Once | INTRAVENOUS | Status: AC

## 2022-10-10 MED ORDER — HEPARIN SOD (PORK) LOCK FLUSH 100 UNIT/ML IV SOLN
500.0000 [IU] | Freq: Once | INTRAVENOUS | Status: AC
  Administered 2022-10-10: 500 [IU] via INTRAVENOUS

## 2022-10-10 NOTE — Patient Instructions (Signed)
[facility]  Discharge Instructions: Thank you for choosing [facility] to provide your oncology and hematology care.  If you have a lab appointment with the Cancer Center - please note that after April 8th, 2024, all labs will be drawn in the cancer center.  You do not have to check in or register with the main entrance as you have in the past but will complete your check-in in the cancer center.  Wear comfortable clothing and clothing appropriate for easy access to any Portacath or PICC line.   We strive to give you quality time with your provider. You may need to reschedule your appointment if you arrive late (15 or more minutes).  Arriving late affects you and other patients whose appointments are after yours.  Also, if you miss three or more appointments without notifying the office, you may be dismissed from the clinic at the provider's discretion.      For prescription refill requests, have your pharmacy contact our office and allow 72 hours for refills to be completed.  To help prevent nausea and vomiting after your treatment, we encourage you to take your nausea medication as directed.  BELOW ARE SYMPTOMS THAT SHOULD BE REPORTED IMMEDIATELY: *FEVER GREATER THAN 100.4 F (38 C) OR HIGHER *CHILLS OR SWEATING *NAUSEA AND VOMITING THAT IS NOT CONTROLLED WITH YOUR NAUSEA MEDICATION *UNUSUAL SHORTNESS OF BREATH *UNUSUAL BRUISING OR BLEEDING *URINARY PROBLEMS (pain or burning when urinating, or frequent urination) *BOWEL PROBLEMS (unusual diarrhea, constipation, pain near the anus) TENDERNESS IN MOUTH AND THROAT WITH OR WITHOUT PRESENCE OF ULCERS (sore throat, sores in mouth, or a toothache) UNUSUAL RASH, SWELLING OR PAIN  UNUSUAL VAGINAL DISCHARGE OR ITCHING   Items with * indicate a potential emergency and should be followed up as soon as possible or go to the Emergency Department if any problems should occur.  Please show the CHEMOTHERAPY ALERT CARD or  IMMUNOTHERAPY ALERT CARD at check-in to the Emergency Department and triage nurse.  Should you have questions after your visit or need to cancel or reschedule your appointment, please contact [facility] [phone]  and follow the prompts.  Office hours are 8:00 a.m. to 4:30 p.m. Monday - Friday. Please note that voicemails left after 4:00 p.m. may not be returned until the following business day.  We are closed weekends and major holidays. You have access to a nurse at all times for urgent questions. Please call the main number to the clinic [phone] and follow the prompts.  For any non-urgent questions, you may also contact your provider using MyChart. We now offer e-Visits for anyone 18 and older to request care online for non-urgent symptoms. For details visit [email or web address].   Also download the MyChart app! Go to the app store, search "MyChart", open the app, select [place], and log in with your MyChart username and password.   

## 2022-10-10 NOTE — Progress Notes (Signed)
Patient tolerated chemotherapy with no complaints voiced.  Side effects with management reviewed with understanding verbalized.  Port site clean and dry with no bruising or swelling noted at site.  Good blood return noted before and after administration of chemotherapy.  Band aid applied.  Patient left in satisfactory condition with VSS and no s/s of distress noted.   

## 2022-10-11 ENCOUNTER — Inpatient Hospital Stay: Payer: Medicare Other

## 2022-10-11 ENCOUNTER — Other Ambulatory Visit: Payer: Self-pay | Admitting: Hematology

## 2022-10-11 VITALS — BP 138/84 | HR 66 | Temp 98.2°F | Resp 18

## 2022-10-11 DIAGNOSIS — C9200 Acute myeloblastic leukemia, not having achieved remission: Secondary | ICD-10-CM

## 2022-10-11 DIAGNOSIS — D46Z Other myelodysplastic syndromes: Secondary | ICD-10-CM

## 2022-10-11 DIAGNOSIS — Z5111 Encounter for antineoplastic chemotherapy: Secondary | ICD-10-CM | POA: Diagnosis not present

## 2022-10-11 DIAGNOSIS — Z95828 Presence of other vascular implants and grafts: Secondary | ICD-10-CM

## 2022-10-11 MED ORDER — HEPARIN SOD (PORK) LOCK FLUSH 100 UNIT/ML IV SOLN
500.0000 [IU] | Freq: Once | INTRAVENOUS | Status: AC | PRN
  Administered 2022-10-11: 500 [IU]

## 2022-10-11 MED ORDER — ONDANSETRON HCL 4 MG/2ML IJ SOLN
4.0000 mg | Freq: Once | INTRAMUSCULAR | Status: AC
  Administered 2022-10-11: 4 mg via INTRAVENOUS
  Filled 2022-10-11: qty 2

## 2022-10-11 MED ORDER — SODIUM CHLORIDE 0.9 % IV SOLN: Freq: Once | INTRAVENOUS | Status: AC

## 2022-10-11 MED ORDER — SODIUM CHLORIDE 0.9% FLUSH
10.0000 mL | INTRAVENOUS | Status: DC | PRN
  Administered 2022-10-11: 10 mL

## 2022-10-11 MED ORDER — SODIUM CHLORIDE 0.9 % IV SOLN
15.0000 mg/m2 | Freq: Once | INTRAVENOUS | Status: AC
  Administered 2022-10-11: 35 mg via INTRAVENOUS
  Filled 2022-10-11: qty 7

## 2022-10-11 NOTE — Progress Notes (Signed)
Patient presents today for chemotherapy infusion.  Patient is in satisfactory condition with no new complaints voiced.  Vital signs are stable.  Labs from 10/09/2022 are all within treatment parameters. We will proceed with treatment per MD orders.    Patient tolerated treatment well with no complaints voiced.  Patient left via motorized wheelchair in stable condition.  Vital signs stable at discharge.  Follow up as scheduled.

## 2022-10-11 NOTE — Progress Notes (Signed)
Decitabine 35mg  diluted in NS54mL. Decitabine - Lot:  [id number], Exp:  02/04/2025  Richardean Sale, RPH, BCPS, BCOP 10/11/2022 1:14 PM

## 2022-10-11 NOTE — Patient Instructions (Signed)
[facility]  Discharge Instructions: Thank you for choosing [facility] to provide your oncology and hematology care.  If you have a lab appointment with the Cancer Center - please note that after April 8th, 2024, all labs will be drawn in the cancer center.  You do not have to check in or register with the main entrance as you have in the past but will complete your check-in in the cancer center.  Wear comfortable clothing and clothing appropriate for easy access to any Portacath or PICC line.   We strive to give you quality time with your provider. You may need to reschedule your appointment if you arrive late (15 or more minutes).  Arriving late affects you and other patients whose appointments are after yours.  Also, if you miss three or more appointments without notifying the office, you may be dismissed from the clinic at the provider's discretion.      For prescription refill requests, have your pharmacy contact our office and allow 72 hours for refills to be completed.    Today you received the following chemotherapy and/or immunotherapy agents Decitabine.  Decitabine Injection What is this medication? DECITABINE (dee SYE ta been) treats blood and bone marrow cancers. It works by slowing down the growth of cancer cells. This medicine may be used for other purposes; ask your health care provider or pharmacist if you have questions. COMMON BRAND NAME(S): Dacogen What should I tell my care team before I take this medication? They need to know if you have any of these conditions: Infection Kidney disease Liver disease An unusual or allergic reaction to decitabine, other medications, foods, dyes, or preservatives Pregnant or trying to get pregnant Breast-feeding How should I use this medication? This medication is infused into a vein. It is given by your care team in a hospital or clinic setting. Talk to your care team about the use of this medication  in children. Special care may be needed. Overdosage: If you think you have taken too much of this medicine contact a poison control center or emergency room at once. NOTE: This medicine is only for you. Do not share this medicine with others. What if I miss a dose? Keep appointments for follow-up doses. It is important not to miss your dose. Call your care team if you are unable to keep an appointment. What may interact with this medication? Interactions are not expected. This list may not describe all possible interactions. Give your health care provider a list of all the medicines, herbs, non-prescription drugs, or dietary supplements you use. Also tell them if you smoke, drink alcohol, or use illegal drugs. Some items may interact with your medicine. What should I watch for while using this medication? Visit your care team for regular checks on your progress. You may need blood work while taking this medication. This medication may make you feel generally unwell. This is not uncommon as chemotherapy can affect healthy cells as well as cancer cells. Report any side effects. Continue your course of treatment even though you feel ill unless your care team tells you to stop. This medication may increase your risk of getting an infection. Call your care team for advice if you get a fever, chills, sore throat, or other symptoms of a cold or flu. Do not treat yourself. Try to avoid being around people who are sick. Be careful brushing or flossing your teeth or using a toothpick because you may get an infection or  bleed more easily. If you have any dental work done, tell your dentist you are receiving this medication. Call your care team if you are around anyone with measles, chickenpox, or if you develop sores or blisters that do not heal properly. Avoid taking medications that contain aspirin, acetaminophen, ibuprofen, naproxen, or ketoprofen unless instructed by your care team. These medications may  hide a fever. Talk to your care team if you or your partner wish to become pregnant or think either of you might be pregnant. This medication can cause serious birth defects if taken during pregnancy or for 6 months after the last dose. A negative pregnancy test is required before starting this medication. A reliable form of contraception is recommended while taking this medication and for 6 months after the last dose. Talk to your care team about reliable forms of contraception. Do not father a child while taking this medication or for 3 months after the last dose. Use a condom while having sex during this time period. Do not breast-feed while taking this medication and for 2 weeks after the last dose. This medication may cause infertility. Talk to your care team if you are concerned about your fertility. What side effects may I notice from receiving this medication? Side effects that you should report to your care team as soon as possible: Allergic reactions--skin rash, itching, hives, swelling of the face, lips, tongue, or throat Infection--fever, chills, cough, sore throat, wounds that don't heal, pain or trouble when passing urine, general feeling of discomfort or being unwell Low red blood cell level--unusual weakness or fatigue, dizziness, headache, trouble breathing Unusual bruising or bleeding Side effects that usually do not require medical attention (report to your care team if they continue or are bothersome): Constipation Diarrhea Fatigue Nausea Pain, redness, or swelling with sores inside the mouth or throat Stomach pain This list may not describe all possible side effects. Call your doctor for medical advice about side effects. You may report side effects to FDA at 1-800-FDA-1088. Where should I keep my medication? This medication is given in a hospital or clinic. It will not be stored at home. NOTE: This sheet is a summary. It may not cover all possible information. If you have  questions about this medicine, talk to your doctor, pharmacist, or health care provider.  2024 Elsevier/Gold Standard (2021-08-03 00:00:00)        To help prevent nausea and vomiting after your treatment, we encourage you to take your nausea medication as directed.  BELOW ARE SYMPTOMS THAT SHOULD BE REPORTED IMMEDIATELY: *FEVER GREATER THAN 100.4 F (38 C) OR HIGHER *CHILLS OR SWEATING *NAUSEA AND VOMITING THAT IS NOT CONTROLLED WITH YOUR NAUSEA MEDICATION *UNUSUAL SHORTNESS OF BREATH *UNUSUAL BRUISING OR BLEEDING *URINARY PROBLEMS (pain or burning when urinating, or frequent urination) *BOWEL PROBLEMS (unusual diarrhea, constipation, pain near the anus) TENDERNESS IN MOUTH AND THROAT WITH OR WITHOUT PRESENCE OF ULCERS (sore throat, sores in mouth, or a toothache) UNUSUAL RASH, SWELLING OR PAIN  UNUSUAL VAGINAL DISCHARGE OR ITCHING   Items with * indicate a potential emergency and should be followed up as soon as possible or go to the Emergency Department if any problems should occur.  Please show the CHEMOTHERAPY ALERT CARD or IMMUNOTHERAPY ALERT CARD at check-in to the Emergency Department and triage nurse.  Should you have questions after your visit or need to cancel or reschedule your appointment, please contact [facility] [phone]  and follow the prompts.  Office hours are 8:00  a.m. to 4:30 p.m. Monday - Friday. Please note that voicemails left after 4:00 p.m. may not be returned until the following business day.  We are closed weekends and major holidays. You have access to a nurse at all times for urgent questions. Please call the main number to the clinic [phone] and follow the prompts.  For any non-urgent questions, you may also contact your provider using MyChart. We now offer e-Visits for anyone 55 and older to request care online for non-urgent symptoms. For details visit [email or web address].   Also download the MyChart app! Go to the app  store, search "MyChart", open the app, select [place], and log in with your MyChart username and password.

## 2022-10-12 ENCOUNTER — Inpatient Hospital Stay: Payer: Medicare Other

## 2022-10-12 VITALS — BP 124/79 | HR 63 | Temp 97.4°F | Resp 18

## 2022-10-12 DIAGNOSIS — Z95828 Presence of other vascular implants and grafts: Secondary | ICD-10-CM

## 2022-10-12 DIAGNOSIS — Z5111 Encounter for antineoplastic chemotherapy: Secondary | ICD-10-CM | POA: Diagnosis not present

## 2022-10-12 DIAGNOSIS — C9200 Acute myeloblastic leukemia, not having achieved remission: Secondary | ICD-10-CM

## 2022-10-12 DIAGNOSIS — D46Z Other myelodysplastic syndromes: Secondary | ICD-10-CM

## 2022-10-12 MED ORDER — SODIUM CHLORIDE 0.9 % IV SOLN
15.0000 mg/m2 | Freq: Once | INTRAVENOUS | Status: AC
  Administered 2022-10-12: 35 mg via INTRAVENOUS
  Filled 2022-10-12: qty 7

## 2022-10-12 MED ORDER — SODIUM CHLORIDE 0.9 % IV SOLN: Freq: Once | INTRAVENOUS | Status: AC

## 2022-10-12 MED ORDER — HEPARIN SOD (PORK) LOCK FLUSH 100 UNIT/ML IV SOLN
500.0000 [IU] | Freq: Once | INTRAVENOUS | Status: AC | PRN
  Administered 2022-10-12: 500 [IU]

## 2022-10-12 MED ORDER — SODIUM CHLORIDE 0.9% FLUSH
10.0000 mL | INTRAVENOUS | Status: DC | PRN
  Administered 2022-10-12: 10 mL

## 2022-10-12 MED ORDER — ONDANSETRON HCL 4 MG/2ML IJ SOLN
4.0000 mg | Freq: Once | INTRAMUSCULAR | Status: AC
  Administered 2022-10-12: 4 mg via INTRAVENOUS
  Filled 2022-10-12: qty 2

## 2022-10-12 NOTE — Patient Instructions (Signed)
[facility] [place]  Discharge Instructions: Thank you for choosing [facility] to provide your oncology and hematology care.  If you have a lab appointment with the Cancer Center - please note that after April 8th, 2024, all labs will be drawn in the cancer center.  You do not have to check in or register with the main entrance as you have in the past but will complete your check-in in the cancer center.  Wear comfortable clothing and clothing appropriate for easy access to any Portacath or PICC line.   We strive to give you quality time with your provider. You may need to reschedule your appointment if you arrive late (15 or more minutes).  Arriving late affects you and other patients whose appointments are after yours.  Also, if you miss three or more appointments without notifying the office, you may be dismissed from the clinic at the provider's discretion.      For prescription refill requests, have your pharmacy contact our office and allow 72 hours for refills to be completed.  To help prevent nausea and vomiting after your treatment, we encourage you to take your nausea medication as directed.  BELOW ARE SYMPTOMS THAT SHOULD BE REPORTED IMMEDIATELY: *FEVER GREATER THAN 100.4 F (38 C) OR HIGHER *CHILLS OR SWEATING *NAUSEA AND VOMITING THAT IS NOT CONTROLLED WITH YOUR NAUSEA MEDICATION *UNUSUAL SHORTNESS OF BREATH *UNUSUAL BRUISING OR BLEEDING *URINARY PROBLEMS (pain or burning when urinating, or frequent urination) *BOWEL PROBLEMS (unusual diarrhea, constipation, pain near the anus) TENDERNESS IN MOUTH AND THROAT WITH OR WITHOUT PRESENCE OF ULCERS (sore throat, sores in mouth, or a toothache) UNUSUAL RASH, SWELLING OR PAIN  UNUSUAL VAGINAL DISCHARGE OR ITCHING   Items with * indicate a potential emergency and should be followed up as soon as possible or go to the Emergency Department if any problems should occur.  Please show the CHEMOTHERAPY ALERT CARD or  IMMUNOTHERAPY ALERT CARD at check-in to the Emergency Department and triage nurse.  Should you have questions after your visit or need to cancel or reschedule your appointment, please contact [facility] [place] [phone]  and follow the prompts.  Office hours are 8:00 a.m. to 4:30 p.m. Monday - Friday. Please note that voicemails left after 4:00 p.m. may not be returned until the following business day.  We are closed weekends and major holidays. You have access to a nurse at all times for urgent questions. Please call the main number to the clinic [phone] and follow the prompts.  For any non-urgent questions, you may also contact your provider using MyChart. We now offer e-Visits for anyone 18 and older to request care online for non-urgent symptoms. For details visit [email or web address].   Also download the MyChart app! Go to the app store, search "MyChart", open the app, select [place], and log in with your MyChart username and password.   

## 2022-10-13 ENCOUNTER — Inpatient Hospital Stay: Payer: Medicare Other

## 2022-10-13 VITALS — BP 123/93 | HR 66 | Temp 97.0°F | Resp 18

## 2022-10-13 DIAGNOSIS — Z95828 Presence of other vascular implants and grafts: Secondary | ICD-10-CM

## 2022-10-13 DIAGNOSIS — D46Z Other myelodysplastic syndromes: Secondary | ICD-10-CM

## 2022-10-13 DIAGNOSIS — Z5111 Encounter for antineoplastic chemotherapy: Secondary | ICD-10-CM | POA: Diagnosis not present

## 2022-10-13 DIAGNOSIS — C9200 Acute myeloblastic leukemia, not having achieved remission: Secondary | ICD-10-CM

## 2022-10-13 MED ORDER — SODIUM CHLORIDE 0.9 % IV SOLN: Freq: Once | INTRAVENOUS | Status: AC

## 2022-10-13 MED ORDER — HEPARIN SOD (PORK) LOCK FLUSH 100 UNIT/ML IV SOLN
500.0000 [IU] | Freq: Once | INTRAVENOUS | Status: AC | PRN
  Administered 2022-10-13: 500 [IU]

## 2022-10-13 MED ORDER — SODIUM CHLORIDE 0.9 % IV SOLN
15.0000 mg/m2 | Freq: Once | INTRAVENOUS | Status: AC
  Administered 2022-10-13: 35 mg via INTRAVENOUS
  Filled 2022-10-13: qty 7

## 2022-10-13 MED ORDER — SODIUM CHLORIDE 0.9% FLUSH
10.0000 mL | INTRAVENOUS | Status: DC | PRN
  Administered 2022-10-13: 10 mL

## 2022-10-13 MED ORDER — ONDANSETRON HCL 4 MG/2ML IJ SOLN
4.0000 mg | Freq: Once | INTRAMUSCULAR | Status: AC
  Administered 2022-10-13: 4 mg via INTRAVENOUS
  Filled 2022-10-13: qty 2

## 2022-10-13 NOTE — Patient Instructions (Signed)
[facility] [place]  Discharge Instructions: Thank you for choosing [facility] to provide your oncology and hematology care.  If you have a lab appointment with the Cancer Center - please note that after April 8th, 2024, all labs will be drawn in the cancer center.  You do not have to check in or register with the main entrance as you have in the past but will complete your check-in in the cancer center.  Wear comfortable clothing and clothing appropriate for easy access to any Portacath or PICC line.   We strive to give you quality time with your provider. You may need to reschedule your appointment if you arrive late (15 or more minutes).  Arriving late affects you and other patients whose appointments are after yours.  Also, if you miss three or more appointments without notifying the office, you may be dismissed from the clinic at the provider's discretion.      For prescription refill requests, have your pharmacy contact our office and allow 72 hours for refills to be completed.    Today you received the following chemotherapy and/or immunotherapy agents Decitabine.  Decitabine Injection What is this medication? DECITABINE (dee SYE ta been) treats blood and bone marrow cancers. It works by slowing down the growth of cancer cells. This medicine may be used for other purposes; ask your health care provider or pharmacist if you have questions. COMMON BRAND NAME(S): Dacogen What should I tell my care team before I take this medication? They need to know if you have any of these conditions: Infection Kidney disease Liver disease An unusual or allergic reaction to decitabine, other medications, foods, dyes, or preservatives Pregnant or trying to get pregnant Breast-feeding How should I use this medication? This medication is infused into a vein. It is given by your care team in a hospital or clinic setting. Talk to your care team about the use of this medication  in children. Special care may be needed. Overdosage: If you think you have taken too much of this medicine contact a poison control center or emergency room at once. NOTE: This medicine is only for you. Do not share this medicine with others. What if I miss a dose? Keep appointments for follow-up doses. It is important not to miss your dose. Call your care team if you are unable to keep an appointment. What may interact with this medication? Interactions are not expected. This list may not describe all possible interactions. Give your health care provider a list of all the medicines, herbs, non-prescription drugs, or dietary supplements you use. Also tell them if you smoke, drink alcohol, or use illegal drugs. Some items may interact with your medicine. What should I watch for while using this medication? Visit your care team for regular checks on your progress. You may need blood work while taking this medication. This medication may make you feel generally unwell. This is not uncommon as chemotherapy can affect healthy cells as well as cancer cells. Report any side effects. Continue your course of treatment even though you feel ill unless your care team tells you to stop. This medication may increase your risk of getting an infection. Call your care team for advice if you get a fever, chills, sore throat, or other symptoms of a cold or flu. Do not treat yourself. Try to avoid being around people who are sick. Be careful brushing or flossing your teeth or using a toothpick because you may get an infection or   bleed more easily. If you have any dental work done, tell your dentist you are receiving this medication. Call your care team if you are around anyone with measles, chickenpox, or if you develop sores or blisters that do not heal properly. Avoid taking medications that contain aspirin, acetaminophen, ibuprofen, naproxen, or ketoprofen unless instructed by your care team. These medications may  hide a fever. Talk to your care team if you or your partner wish to become pregnant or think either of you might be pregnant. This medication can cause serious birth defects if taken during pregnancy or for 6 months after the last dose. A negative pregnancy test is required before starting this medication. A reliable form of contraception is recommended while taking this medication and for 6 months after the last dose. Talk to your care team about reliable forms of contraception. Do not father a child while taking this medication or for 3 months after the last dose. Use a condom while having sex during this time period. Do not breast-feed while taking this medication and for 2 weeks after the last dose. This medication may cause infertility. Talk to your care team if you are concerned about your fertility. What side effects may I notice from receiving this medication? Side effects that you should report to your care team as soon as possible: Allergic reactions--skin rash, itching, hives, swelling of the face, lips, tongue, or throat Infection--fever, chills, cough, sore throat, wounds that don't heal, pain or trouble when passing urine, general feeling of discomfort or being unwell Low red blood cell level--unusual weakness or fatigue, dizziness, headache, trouble breathing Unusual bruising or bleeding Side effects that usually do not require medical attention (report to your care team if they continue or are bothersome): Constipation Diarrhea Fatigue Nausea Pain, redness, or swelling with sores inside the mouth or throat Stomach pain This list may not describe all possible side effects. Call your doctor for medical advice about side effects. You may report side effects to FDA at 1-800-FDA-1088. Where should I keep my medication? This medication is given in a hospital or clinic. It will not be stored at home. NOTE: This sheet is a summary. It may not cover all possible information. If you have  questions about this medicine, talk to your doctor, pharmacist, or health care provider.  2024 Elsevier/Gold Standard (2021-08-03 00:00:00)        To help prevent nausea and vomiting after your treatment, we encourage you to take your nausea medication as directed.  BELOW ARE SYMPTOMS THAT SHOULD BE REPORTED IMMEDIATELY: *FEVER GREATER THAN 100.4 F (38 C) OR HIGHER *CHILLS OR SWEATING *NAUSEA AND VOMITING THAT IS NOT CONTROLLED WITH YOUR NAUSEA MEDICATION *UNUSUAL SHORTNESS OF BREATH *UNUSUAL BRUISING OR BLEEDING *URINARY PROBLEMS (pain or burning when urinating, or frequent urination) *BOWEL PROBLEMS (unusual diarrhea, constipation, pain near the anus) TENDERNESS IN MOUTH AND THROAT WITH OR WITHOUT PRESENCE OF ULCERS (sore throat, sores in mouth, or a toothache) UNUSUAL RASH, SWELLING OR PAIN  UNUSUAL VAGINAL DISCHARGE OR ITCHING   Items with * indicate a potential emergency and should be followed up as soon as possible or go to the Emergency Department if any problems should occur.  Please show the CHEMOTHERAPY ALERT CARD or IMMUNOTHERAPY ALERT CARD at check-in to the Emergency Department and triage nurse.  Should you have questions after your visit or need to cancel or reschedule your appointment, please contact [facility] AT  [phone]  and follow the prompts.  Office hours are 8:00   a.m. to 4:30 p.m. Monday - Friday. Please note that voicemails left after 4:00 p.m. may not be returned until the following business day.  We are closed weekends and major holidays. You have access to a nurse at all times for urgent questions. Please call the main number to the clinic [phone] and follow the prompts.  For any non-urgent questions, you may also contact your provider using MyChart. We now offer e-Visits for anyone 18 and older to request care online for non-urgent symptoms. For details visit [email or web address].   Also download the MyChart app! Go to the app  store, search "MyChart", open the app, select [place], and log in with your MyChart username and password.   

## 2022-10-13 NOTE — Progress Notes (Signed)
Patient presents today for chemotherapy infusion.  Patient is in satisfactory condition with no new complaints voiced.  Vital signs are stable.  Labs from 10/09/22 are all within treatment parameters.  We will proceed with treatment per MD orders.    Patient tolerated treatment well with no complaints voiced.  Patient left via motorized wheelchair in stable condition.  Vital signs stable at discharge.  Follow up as scheduled.

## 2022-10-24 ENCOUNTER — Other Ambulatory Visit (HOSPITAL_COMMUNITY): Payer: Self-pay

## 2022-10-31 ENCOUNTER — Ambulatory Visit (INDEPENDENT_AMBULATORY_CARE_PROVIDER_SITE_OTHER): Payer: Medicare Other | Admitting: Internal Medicine

## 2022-10-31 ENCOUNTER — Encounter: Payer: Self-pay | Admitting: Internal Medicine

## 2022-10-31 VITALS — BP 126/74 | HR 61 | Ht 69.5 in | Wt 224.2 lb

## 2022-10-31 DIAGNOSIS — N401 Enlarged prostate with lower urinary tract symptoms: Secondary | ICD-10-CM | POA: Diagnosis not present

## 2022-10-31 DIAGNOSIS — R400 Somnolence: Secondary | ICD-10-CM | POA: Diagnosis not present

## 2022-10-31 MED ORDER — TAMSULOSIN HCL 0.4 MG PO CAPS
0.8000 mg | ORAL_CAPSULE | Freq: Every day | ORAL | 2 refills | Status: DC
Start: 2022-10-31 — End: 2023-03-26

## 2022-10-31 NOTE — Patient Instructions (Signed)
It was a pleasure to see you today.  Thank you for giving [place] the opportunity to be involved in your care.  Below is a brief recap of your visit and next steps.  We will plan to see you again in 3 months.  Summary Tamsulosin 0.8 mg daily prescribed today No additional medication changes Follow up in 3 months

## 2022-10-31 NOTE — Progress Notes (Signed)
Established Patient Office Visit  Subjective   Patient ID: Richard Wallace, male    DOB: March 06, 1946  Age: 77 y.o. MRN: [id number]  Chief Complaint  Patient presents with   Hypertension    Follow up   [name] returns to care today for routine follow-up.  He was last evaluated by me on 3/25 as a new patient presenting to establish care.  Astelin nasal spray was prescribed for treatment of allergic rhinitis.  He endorsed daytime somnolence and I recommended that he abstain from driving.  No medication changes were made and 27-month follow-up was arranged.  In the interim he has been seen by neurology and oncology for follow-up.  There have otherwise been no acute interval events.  [name] reports feeling fairly well today.  His acute concern is urinary retention in the setting of BPH.  He states that he is currently taking tamsulosin 0.4 mg daily and finasteride.  He is interested in increasing tamsulosin to 0.8 mg daily and discontinuing finasteride.  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   Cancer (HCC) 01-28-2014   skin cancer   CKD (chronic kidney disease), stage III (HCC)    GERD (gastroesophageal reflux disease)    Heart murmur    NOTED DURING PHYSICAL WHEN HE WAS ENLISTING IN [facility] , DIDNT KNOW UNTIL THAT TIME AND REPORTS , "THATS THE LAST I HEARD ABOUT IT "    History of hypertension    no longer issue   History of kidney stones    History of malignant melanoma of skin    excision top of scalp 2015-- no recurrence   History of urinary retention    post op lumbar fusion surgery 04/ 2016   Hypertension    Kidney dysfunction    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, MONITORED BY [facility] DR Jeannett Senior DAHLSTEDT    Left ureteral calculus    Seasonal allergies    Wears glasses    Wears glasses    Wears partial dentures    upper and lower   Past Surgical History:  Procedure Laterality Date   ANKLE FUSION Right  2007   CARPAL TUNNEL RELEASE Left 12/28/2009   w/ pulley release left long finger   CARPAL TUNNEL RELEASE Right 07/22/2013   Procedure: RIGHT CARPAL TUNNEL RELEASE;  Surgeon: Wyn Forster., MD;  Location: [facility];  Service: Orthopedics;  Laterality: Right;   COLONOSCOPY     CYSTO/  LEFT RETROGRADE PYELOGRAM  11/21/2010   CYSTOSCOPY WITH STENT PLACEMENT Left 03/09/2016   Procedure: CYSTOSCOPY WITH STENT PLACEMENT;  Surgeon: Marcine Matar, MD;  Location: [facility];  Service: Urology;  Laterality: Left;   CYSTOSCOPY/RETROGRADE/URETEROSCOPY/STONE EXTRACTION WITH BASKET Left 03/09/2016   Procedure: CYSTOSCOPY/RETROGRADE/URETEROSCOPY/STONE EXTRACTION WITH BASKET;  Surgeon: Marcine Matar, MD;  Location: [facility];  Service: Urology;  Laterality: Left;   LEFT URETEROSCOPIC LASER LITHOTRIPSY STONE EXTRACTION/ STENT PLACEMENT  05/23/2010   MOHS SURGERY     TOP OF THE HEAD    ORIF ANKLE FRACTURE Right 1978   PORT-A-CATH REMOVAL Right 02/14/2019   Procedure: MINOR REMOVAL PORT-A-CATH;  Surgeon: Franky Macho, MD;  Location: [facility];  Service: General;  Laterality: Right;   PORTACATH PLACEMENT Right 08/19/2018   Procedure: INSERTION PORT-A-CATH (attached catheter in right subclavian);  Surgeon: Franky Macho, MD;  Location: [facility];  Service: General;  Laterality: Right;   PORTACATH PLACEMENT  Left 05/16/2019   Procedure: INSERTION PORT-A-CATH (attached catheter in left subclavian);  Surgeon: Franky Macho, MD;  Location: [facility];  Service: General;  Laterality: Left;   POSTERIOR LUMBAR FUSION  08/21/2014   laminectomy and decompression L2 -- L5   RIGHT LOWER LEG SURGERY  X3  1975 to 1976   including ORIF   TONSILLECTOMY AND ADENOIDECTOMY  1986   UMBILICAL HERNIA REPAIR  2009 approx   Social History   Tobacco Use   Smoking status: Former    Years: 20    Types: Cigarettes    Quit date: 07/17/1986    Years since quitting: 36.3   Smokeless  tobacco: Never  Vaping Use   Vaping Use: Never used  Substance Use Topics   Alcohol use: Yes    Alcohol/week: 7.0 - 14.0 standard drinks of alcohol    Types: 7 - 14 Cans of beer per week    Comment: 1 -2 beer daily   Drug use: No   Family History  Problem Relation Age of Onset   Stroke Mother    Prostate cancer Father    Bone cancer Father    Diverticulitis Father    Rheum arthritis Sister    Urinary tract infection Sister    Colon cancer Neg Hx    No Known Allergies  Review of Systems  Genitourinary:        BPH/urinary retention  All other systems reviewed and are negative.    Objective:     BP 126/74   Pulse 61   Ht 5' 9.5" (1.765 m)   Wt 224 lb 3.2 oz (101.7 kg)   SpO2 94%   BMI 32.63 kg/m  BP Readings from Last 3 Encounters:  10/31/22 126/74  10/13/22 (!) 123/93  10/12/22 124/79   Physical Exam Vitals reviewed.  Constitutional:      General: He is not in acute distress.    Appearance: Normal appearance. He is not ill-appearing.  HENT:     Head: Normocephalic and atraumatic.     Right Ear: External ear normal.     Left Ear: External ear normal.     Nose: Nose normal. No congestion or rhinorrhea.     Mouth/Throat:     Mouth: Mucous membranes are moist.     Pharynx: Oropharynx is clear.  Eyes:     General: No scleral icterus.    Extraocular Movements: Extraocular movements intact.     Conjunctiva/sclera: Conjunctivae normal.     Pupils: Pupils are equal, round, and reactive to light.  Cardiovascular:     Rate and Rhythm: Normal rate and regular rhythm.     Pulses: Normal pulses.     Heart sounds: Normal heart sounds. No murmur heard. Pulmonary:     Effort: Pulmonary effort is normal.     Breath sounds: Normal breath sounds. No wheezing, rhonchi or rales.  Abdominal:     General: Abdomen is flat. Bowel sounds are normal. There is no distension.     Palpations: Abdomen is soft.     Tenderness: There is no abdominal tenderness.  Musculoskeletal:         General: No swelling or deformity. Normal range of motion.     Cervical back: Normal range of motion.     Right lower leg: Edema present.     Comments: Port in L chest wall  Skin:    General: Skin is warm and dry.     Capillary Refill: Capillary refill takes less than 2 seconds.  Neurological:     General: No focal deficit present.     Mental Status: He is alert and oriented to person, place, and time.     Motor: No weakness.  Psychiatric:        Mood and Affect: Mood normal.        Behavior: Behavior normal.        Thought Content: Thought content normal.   Last CBC Lab Results  Component Value Date   WBC 7.3 10/09/2022   HGB 15.3 10/09/2022   HCT 46.6 10/09/2022   MCV 94.7 10/09/2022   MCH 31.1 10/09/2022   RDW 15.7 (H) 10/09/2022   PLT 138 (L) 10/09/2022   Last metabolic panel Lab Results  Component Value Date   GLUCOSE 96 10/09/2022   NA 138 10/09/2022   K 4.1 10/09/2022   CL 103 10/09/2022   CO2 25 10/09/2022   BUN 26 (H) 10/09/2022   CREATININE 0.96 10/09/2022   GFRNONAA >60 10/09/2022   CALCIUM 9.2 10/09/2022   PHOS 2.8 01/24/2021   PROT 6.4 (L) 10/09/2022   ALBUMIN 3.9 10/09/2022   LABGLOB 2.4 07/08/2018   AGRATIO 1.6 07/08/2018   BILITOT 0.5 10/09/2022   ALKPHOS 62 10/09/2022   AST 22 10/09/2022   ALT 27 10/09/2022   ANIONGAP 10 10/09/2022   Last vitamin B12 and Folate Lab Results  Component Value Date   VITAMINB12 2,561 (H) 07/08/2018   FOLATE 20.7 07/08/2018     Assessment & Plan:   Problem List Items Addressed This Visit       BPH (benign prostatic hyperplasia) - Primary    Symptoms were previously well-controlled with tamsulosin 0.4 mg daily.  Finasteride was added in the interim and his symptoms remain poorly controlled.  He is interested in increasing tamsulosin to 0.8 mg daily and discontinuing finasteride. -Through shared decision making, we will increase tamsulosin to 0.8 mg daily.  Finasteride has been discontinued.  Consider  restarting finasteride if symptoms remain poorly controlled.      Daytime somnolence    His wife has previously reported daytime somnolence, noting that he has started to fall asleep while driving.  I previously recommended that he abstain from driving.  In the interim, he has stopped taking Xanax and Aricept.  His symptoms have significantly improved.  He is currently driving without difficulty.       Return in about 3 months (around 01/31/2023).    Billie Lade, MD

## 2022-10-31 NOTE — Assessment & Plan Note (Signed)
Symptoms were previously well-controlled with tamsulosin 0.4 mg daily.  Finasteride was added in the interim and his symptoms remain poorly controlled.  He is interested in increasing tamsulosin to 0.8 mg daily and discontinuing finasteride. -Through shared decision making, we will increase tamsulosin to 0.8 mg daily.  Finasteride has been discontinued.  Consider restarting finasteride if symptoms remain poorly controlled.

## 2022-10-31 NOTE — Assessment & Plan Note (Signed)
His wife has previously reported daytime somnolence, noting that he has started to fall asleep while driving.  I previously recommended that he abstain from driving.  In the interim, he has stopped taking Xanax and Aricept.  His symptoms have significantly improved.  He is currently driving without difficulty.

## 2022-11-01 ENCOUNTER — Other Ambulatory Visit: Payer: Self-pay

## 2022-11-13 ENCOUNTER — Other Ambulatory Visit: Payer: Self-pay | Admitting: Hematology

## 2022-11-17 ENCOUNTER — Other Ambulatory Visit: Payer: Self-pay

## 2022-11-17 DIAGNOSIS — D46Z Other myelodysplastic syndromes: Secondary | ICD-10-CM

## 2022-11-19 NOTE — Progress Notes (Signed)
[facility] [address] [place] [address]    Clinic Day:  11/20/2022  Referring physician: Billie Lade, MD  Patient Care Team: Billie Lade, MD as PCP - General (Internal Medicine) Doreatha Massed, MD as Medical Oncologist (Medical Oncology)   ASSESSMENT & PLAN:   Assessment: 1.  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  -BMBX on 02/25/2019 after cycle 1 did not show any evidence of leukemia. -CT CAP on 02/12/2019 shows numerous bilateral irregular/spiculated pulmonary nodules measuring up to 14 mm, nonspecific.  Hepatic steatosis.  Spleen is normal. -I have talked to Dr.Ravandi at [facility] per patient request.  There are clinical trials available upon progression of his AML.  There are also some clinical trials available now based on MRD positivity.  Patient not interested in moving to [place] at this time.    Plan: 1.  AML: - Denies any fevers or infections in the last 6 weeks. - Reviewed labs today: Normal LFTs and creatinine.  CBC grossly normal.  Differential was normal. - Proceed with next cycle of decitabine 15 mg/m x 5 days.  He will start venetoclax 200 mg daily for 14 days. - RTC 6 weeks for follow-up.   2.  Arthralgias: - Continue oxycodone 5 mg daily as needed.  Will send refill.   3.  Hypomagnesemia: - Continue magnesium 3 times daily.  Magnesium is normal.   4.  Hypertension: - Continue Norvasc 2.5 mg daily.  Blood pressure is normal.   5.  Dementia: - Aricept and Xanax are discontinued. - Continue gabapentin twice daily.  6.  Right leg swelling: - Continue Lasix 20 mg daily as needed.    Orders Placed This Encounter  Procedures   CBC with Differential    Standing Status:   Future    Standing Expiration Date:   03/25/2024   Comprehensive metabolic panel    Standing Status:   Future    Standing Expiration Date:    03/25/2024   CBC with Differential    Standing Status:   Future    Standing Expiration Date:   05/06/2024   Comprehensive metabolic panel    Standing Status:   Future    Standing Expiration Date:   05/06/2024       Mikeal Hawthorne R Teague,acting as a scribe for Doreatha Massed, MD.,have documented all relevant documentation on the behalf of Doreatha Massed, MD,as directed by  Doreatha Massed, MD while in the presence of Doreatha Massed, MD.  I, Doreatha Massed MD, have reviewed the above documentation for accuracy and completeness, and I agree with the above.    Doreatha Massed, MD   7/15/20242:02 PM  CHIEF COMPLAINT:   Diagnosis: AML    Cancer Staging  No matching staging information was found for the patient.    Prior Therapy: none  Current Therapy:  Decitabine every 6 weeks & venetoclax 200 mg x2 weeks after chemo    HISTORY OF PRESENT ILLNESS:   Oncology History  MDS (myelodysplastic syndrome), high grade (HCC)  08/14/2018 Initial Diagnosis   MDS (myelodysplastic syndrome), high grade (HCC)   08/22/2018 - 12/06/2018 Chemotherapy   The patient had palonosetron (ALOXI) injection 0.25 mg, 0.25 mg, Intravenous,  Once, 4 of 6 cycles Administration: 0.25 mg (08/22/2018), 0.25 mg (08/26/2018), 0.25 mg (08/28/2018), 0.25 mg (08/30/2018), 0.25 mg (10/01/2018), 0.25 mg (10/02/2018), 0.25 mg (11/04/2018), 0.25 mg (09/23/2018), 0.25 mg (09/25/2018), 0.25  mg (09/27/2018), 0.25 mg (10/28/2018), 0.25 mg (10/30/2018), 0.25 mg (11/01/2018), 0.25 mg (12/02/2018), 0.25 mg (12/04/2018), 0.25 mg (12/06/2018) azaCITIDine (VIDAZA) 100 mg in sodium chloride 0.9 % 50 mL chemo infusion, 110 mg (66.7 % of original dose 75 mg/m2), Intravenous, Once, 4 of 6 cycles Dose modification: 50 mg/m2 (original dose 75 mg/m2, Cycle 1, Reason: Provider Judgment), 50 mg/m2 (original dose 75 mg/m2, Cycle 2, Reason: Provider Judgment) Administration: 100 mg (08/22/2018), 100 mg (08/23/2018), 100 mg (08/26/2018),  100 mg (08/27/2018), 100 mg (08/28/2018), 100 mg (08/29/2018), 100 mg (08/30/2018), 100 mg (10/01/2018), 100 mg (10/02/2018), 100 mg (11/04/2018), 100 mg (11/05/2018), 100 mg (09/23/2018), 100 mg (09/24/2018), 100 mg (09/25/2018), 100 mg (09/26/2018), 100 mg (09/27/2018), 100 mg (10/28/2018), 100 mg (10/29/2018), 100 mg (10/30/2018), 100 mg (10/31/2018), 100 mg (11/01/2018), 100 mg (12/02/2018), 100 mg (12/03/2018), 100 mg (12/04/2018), 100 mg (12/05/2018), 100 mg (12/06/2018)  for chemotherapy treatment.    01/20/2019 - 12/02/2021 Chemotherapy   Patient is on Treatment Plan : MYELODYSPLASIA Decitabine D1-5 q42d     01/20/2019 -  Chemotherapy   Patient is on Treatment Plan : AML Decitabine D1-5 q 42d     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HCC)  01/07/2019 Initial Diagnosis   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HCC)   01/20/2019 - 12/02/2021 Chemotherapy   Patient is on Treatment Plan : MYELODYSPLASIA Decitabine D1-5 q42d     01/20/2019 -  Chemotherapy   Patient is on Treatment Plan : AML Decitabine D1-5 q 42d        INTERVAL HISTORY:   Elson is a 77 y.o. male presenting to clinic today for follow up of AML. He was last seen by me on 10/09/22.  Today, he states that he is doing well overall. His appetite level is at 100%. His energy level is at 50%. He is accompanied by his wife.  He reports healthy appetite. He denies any issues with treatment, including GI issues, bleeding, fever, nausea, vomiting, constipation, or diarrhea. He is taking Gabapentin 1x in the morning and 1x at night, venetoclax, amlodipine, oxycodone prn, and other medications as prescribed.   PAST MEDICAL HISTORY:   Past Medical History: 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   Cancer (HCC) 01-28-2014   skin cancer   CKD (chronic kidney disease), stage III (HCC)    GERD (gastroesophageal reflux disease)    Heart murmur    NOTED DURING PHYSICAL WHEN HE WAS ENLISTING IN [facility] ,  DIDNT KNOW UNTIL THAT TIME AND REPORTS , "THATS THE LAST I HEARD ABOUT IT "    History of hypertension    no longer issue   History of kidney stones    History of malignant melanoma of skin    excision top of scalp 2015-- no recurrence   History of urinary retention    post op lumbar fusion surgery 04/ 2016   Hypertension    Kidney dysfunction    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, MONITORED BY [facility] DR Jeannett Senior DAHLSTEDT    Left ureteral calculus    Seasonal allergies    Wears glasses    Wears glasses    Wears partial dentures    upper and lower    Surgical History: Past Surgical History:  Procedure Laterality Date   ANKLE FUSION Right 2007   CARPAL TUNNEL RELEASE Left 12/28/2009   w/ pulley release left long finger   CARPAL TUNNEL RELEASE Right 07/22/2013  Procedure: RIGHT CARPAL TUNNEL RELEASE;  Surgeon: Wyn Forster., MD;  Location: [facility];  Service: Orthopedics;  Laterality: Right;   COLONOSCOPY     CYSTO/  LEFT RETROGRADE PYELOGRAM  11/21/2010   CYSTOSCOPY WITH STENT PLACEMENT Left 03/09/2016   Procedure: CYSTOSCOPY WITH STENT PLACEMENT;  Surgeon: Marcine Matar, MD;  Location: [facility];  Service: Urology;  Laterality: Left;   CYSTOSCOPY/RETROGRADE/URETEROSCOPY/STONE EXTRACTION WITH BASKET Left 03/09/2016   Procedure: CYSTOSCOPY/RETROGRADE/URETEROSCOPY/STONE EXTRACTION WITH BASKET;  Surgeon: Marcine Matar, MD;  Location: [facility];  Service: Urology;  Laterality: Left;   LEFT URETEROSCOPIC LASER LITHOTRIPSY STONE EXTRACTION/ STENT PLACEMENT  05/23/2010   MOHS SURGERY     TOP OF THE HEAD    ORIF ANKLE FRACTURE Right 1978   PORT-A-CATH REMOVAL Right 02/14/2019   Procedure: MINOR REMOVAL PORT-A-CATH;  Surgeon: Franky Macho, MD;  Location: [facility];  Service: General;  Laterality: Right;   PORTACATH PLACEMENT Right 08/19/2018   Procedure: INSERTION PORT-A-CATH (attached catheter in right subclavian);   Surgeon: Franky Macho, MD;  Location: [facility];  Service: General;  Laterality: Right;   PORTACATH PLACEMENT Left 05/16/2019   Procedure: INSERTION PORT-A-CATH (attached catheter in left subclavian);  Surgeon: Franky Macho, MD;  Location: [facility];  Service: General;  Laterality: Left;   POSTERIOR LUMBAR FUSION  08/21/2014   laminectomy and decompression L2 -- L5   RIGHT LOWER LEG SURGERY  X3  1975 to 1976   including ORIF   TONSILLECTOMY AND ADENOIDECTOMY  1986   UMBILICAL HERNIA REPAIR  2009 approx    Social History: Social History   Socioeconomic History   Marital status: Married    Spouse name: Not on file   Number of children: Not on file   Years of education: Not on file   Highest education level: Associate degree: occupational, Scientist, product/process development, or vocational program  Occupational History   Not on file  Tobacco Use   Smoking status: Former    Current packs/day: 0.00    Types: Cigarettes    Start date: 07/17/1966    Quit date: 07/17/1986    Years since quitting: 36.3   Smokeless tobacco: Never  Vaping Use   Vaping status: Never Used  Substance and Sexual Activity   Alcohol use: Yes    Alcohol/week: 7.0 - 14.0 standard drinks of alcohol    Types: 7 - 14 Cans of beer per week    Comment: 1 -2 beer daily   Drug use: No   Sexual activity: Not Currently  Other Topics Concern   Not on file  Social History Narrative   Not on file   Social Determinants of Health   Financial Resource Strain: Low Risk  (10/30/2022)   Overall Financial Resource Strain (CARDIA)    Difficulty of Paying Living Expenses: Not very hard  Food Insecurity: No Food Insecurity (10/30/2022)   Hunger Vital Sign    Worried About Running Out of Food in the Last Year: Never true    Ran Out of Food in the Last Year: Never true  Transportation Needs: No Transportation Needs (10/30/2022)   PRAPARE - Administrator, Civil Service (Medical): No    Lack of Transportation (Non-Medical): No  Physical  Activity: Unknown (10/30/2022)   Exercise Vital Sign    Days of Exercise per Week: 0 days    Minutes of Exercise per Session: Not on file  Stress: No Stress Concern Present (10/30/2022)   [employer] of Occupational Health -  Occupational Stress Questionnaire    Feeling of Stress : Not at all  Social Connections: Moderately Isolated (10/30/2022)   Social Connection and Isolation Panel [NHANES]    Frequency of Communication with Friends and Family: Twice a week    Frequency of Social Gatherings with Friends and Family: Once a week    Attends Religious Services: Never    Database administrator or Organizations: No    Attends Engineer, structural: Not on file    Marital Status: Married  Intimate Partner Violence: Unknown (02/17/2019)   Humiliation, Afraid, Rape, and Kick questionnaire    Fear of Current or Ex-Partner: Patient declined    Emotionally Abused: Patient declined    Physically Abused: Patient declined    Sexually Abused: Patient declined    Family History: Family History  Problem Relation Age of Onset   Stroke Mother    Prostate cancer Father    Bone cancer Father    Diverticulitis Father    Rheum arthritis Sister    Urinary tract infection Sister    Colon cancer Neg Hx     Current Medications:  Current Outpatient Medications:    ACETAMINOPHEN EXTRA STRENGTH 500 MG capsule, , Disp: , Rfl:    allopurinol (ZYLOPRIM) 300 MG tablet, TAKE 1 TABLET BY MOUTH AT BEDTIME, Disp: 90 tablet, Rfl: 0   amLODipine (NORVASC) 5 MG tablet, Take 1 tablet (5 mg total) by mouth daily., Disp: 90 tablet, Rfl: 1   azelastine (ASTELIN) 0.1 % nasal spray, Place 1 spray into both nostrils 2 (two) times daily. Use in each nostril as directed, Disp: 30 mL, Rfl: 5   diclofenac Sodium (VOLTAREN) 1 % GEL, Apply three times daily to knees as needed for pain, Disp: 50 g, Rfl: 3   furosemide (LASIX) 20 MG tablet, TAKE 1 TABLET BY MOUTH ONCE DAILY AS NEEDED, Disp: 30 tablet, Rfl: 0    gabapentin (NEURONTIN) 300 MG capsule, Take 1 capsule (300 mg total) by mouth 3 (three) times daily as needed. (Patient taking differently: Take 300 mg by mouth at bedtime. Takes at bedtime), Disp: 90 capsule, Rfl: 11   lidocaine-prilocaine (EMLA) cream, APPLY CREAM TOPICALLY ONE HOUR PRIOR TO CHEMOTHERAPY APPOINTMENT, Disp: 30 g, Rfl: 0   magnesium oxide (MAG-OX) 400 (240 Mg) MG tablet, TAKE 1 TABLET BY MOUTH THREE TIMES DAILY, Disp: 90 tablet, Rfl: 3   SF 5000 PLUS 1.1 % CREA dental cream, SMARTSIG:Sparingly By Mouth Every Night, Disp: , Rfl:    tamsulosin (FLOMAX) 0.4 MG CAPS capsule, Take 2 capsules (0.8 mg total) by mouth daily., Disp: 60 capsule, Rfl: 2   venetoclax (VENCLEXTA) 100 MG tablet, TAKE 2 TABLETS (200 MG) BY MOUTH DAILY, Disp: 60 tablet, Rfl: 2   oxyCODONE (OXY IR/ROXICODONE) 5 MG immediate release tablet, Take 1 tablet (5 mg total) by mouth every 6 (six) hours as needed., Disp: 120 tablet, Rfl: 0 No current facility-administered medications for this visit.  Facility-Administered Medications Ordered in Other Visits:    heparin lock flush 100 unit/mL, 500 Units, Intracatheter, Once PRN, Doreatha Massed, MD   octreotide (SANDOSTATIN LAR) 30 MG IM injection, , , ,    sodium chloride flush (NS) 0.9 % injection 10 mL, 10 mL, Intracatheter, PRN, Doreatha Massed, MD   sodium chloride flush (NS) 0.9 % injection 20 mL, 20 mL, Intravenous, PRN, Doreatha Massed, MD, 20 mL at 07/21/19 1053   Allergies: No Known Allergies  REVIEW OF SYSTEMS:   Review of Systems  Constitutional:  Positive for  fatigue. Negative for chills and fever.  HENT:   Negative for lump/mass, mouth sores, nosebleeds, sore throat and trouble swallowing.   Eyes:  Negative for eye problems.  Respiratory:  Negative for cough and shortness of breath.   Cardiovascular:  Negative for chest pain, leg swelling and palpitations.  Gastrointestinal:  Negative for abdominal pain, constipation, diarrhea, nausea and  vomiting.  Genitourinary:  Negative for bladder incontinence, difficulty urinating, dysuria, frequency, hematuria and nocturia.   Musculoskeletal:  Negative for arthralgias, back pain, flank pain, myalgias and neck pain.  Skin:  Negative for itching and rash.  Neurological:  Negative for dizziness, headaches and numbness.  Hematological:  Does not bruise/bleed easily.  Psychiatric/Behavioral:  Negative for depression, sleep disturbance and suicidal ideas. The patient is not nervous/anxious.   All other systems reviewed and are negative.    VITALS:   Blood pressure 119/75, pulse 67, temperature 97.8 F (36.6 C), resp. rate 18, weight 228 lb 1.6 oz (103.5 kg), SpO2 97%.  Wt Readings from Last 3 Encounters:  11/20/22 228 lb 1.6 oz (103.5 kg)  10/31/22 224 lb 3.2 oz (101.7 kg)  10/09/22 225 lb 8 oz (102.3 kg)    Body mass index is 33.2 kg/m.  Performance status (ECOG): 1 - Symptomatic but completely ambulatory  PHYSICAL EXAM:   Physical Exam Vitals and nursing note reviewed. Exam conducted with a chaperone present.  Constitutional:      Appearance: Normal appearance.  Cardiovascular:     Rate and Rhythm: Normal rate and regular rhythm.     Pulses: Normal pulses.     Heart sounds: Normal heart sounds.  Pulmonary:     Effort: Pulmonary effort is normal.     Breath sounds: Normal breath sounds.  Abdominal:     Palpations: Abdomen is soft. There is no hepatomegaly, splenomegaly or mass.     Tenderness: There is no abdominal tenderness.  Musculoskeletal:     Right lower leg: 1+ Edema present.     Left lower leg: No edema.  Lymphadenopathy:     Cervical: No cervical adenopathy.     Right cervical: No superficial, deep or posterior cervical adenopathy.    Left cervical: No superficial, deep or posterior cervical adenopathy.     Upper Body:     Right upper body: No supraclavicular or axillary adenopathy.     Left upper body: No supraclavicular or axillary adenopathy.   Neurological:     General: No focal deficit present.     Mental Status: He is alert and oriented to person, place, and time.  Psychiatric:        Mood and Affect: Mood normal.        Behavior: Behavior normal.     LABS:      Latest Ref Rng & Units 11/20/2022    9:43 AM 10/09/2022    9:37 AM 08/21/2022    9:17 AM  CBC  WBC 4.0 - 10.5 K/uL 6.6  7.3  4.8   Hemoglobin 13.0 - 17.0 g/dL 16.1  09.6  04.5   Hematocrit 39.0 - 52.0 % 44.8  46.6  47.0   Platelets 150 - 400 K/uL 155  138  149       Latest Ref Rng & Units 11/20/2022    9:43 AM 10/09/2022    9:37 AM 08/21/2022    9:17 AM  CMP  Glucose 70 - 99 mg/dL 93  96  90   BUN 8 - 23 mg/dL 20  26  21  Creatinine 0.61 - 1.24 mg/dL 8.65  7.84  6.96   Sodium 135 - 145 mmol/L 138  138  139   Potassium 3.5 - 5.1 mmol/L 4.3  4.1  3.8   Chloride 98 - 111 mmol/L 107  103  108   CO2 22 - 32 mmol/L 25  25  25    Calcium 8.9 - 10.3 mg/dL 9.0  9.2  9.1   Total Protein 6.5 - 8.1 g/dL 6.2  6.4  6.4   Total Bilirubin 0.3 - 1.2 mg/dL 0.7  0.5  0.5   Alkaline Phos 38 - 126 U/L 67  62  62   AST 15 - 41 U/L 26  22  25    ALT 0 - 44 U/L 32  27  25      No results found for: "CEA1", "CEA" / No results found for: "CEA1", "CEA" No results found for: "PSA1" No results found for: "EXB284" No results found for: "CAN125"  Lab Results  Component Value Date   TOTALPROTELP 6.2 07/08/2018   ALBUMINELP 3.8 07/08/2018   A1GS 0.2 07/08/2018   A2GS 0.6 07/08/2018   BETS 1.0 07/08/2018   GAMS 0.6 07/08/2018   MSPIKE Not Observed 07/08/2018   SPEI Comment 07/08/2018   Lab Results  Component Value Date   FERRITIN 429 (H) 07/08/2018   Lab Results  Component Value Date   LDH 154 02/27/2022   LDH 147 11/28/2021   LDH 157 10/17/2021     STUDIES:   No results found.

## 2022-11-20 ENCOUNTER — Inpatient Hospital Stay: Payer: Medicare Other | Attending: Hematology

## 2022-11-20 ENCOUNTER — Inpatient Hospital Stay: Payer: Medicare Other

## 2022-11-20 ENCOUNTER — Encounter: Payer: Self-pay | Admitting: Hematology

## 2022-11-20 ENCOUNTER — Inpatient Hospital Stay: Payer: Medicare Other | Admitting: Hematology

## 2022-11-20 VITALS — BP 119/75 | HR 67 | Temp 97.8°F | Resp 18 | Wt 228.1 lb

## 2022-11-20 VITALS — BP 136/84 | HR 77 | Temp 97.5°F | Resp 18

## 2022-11-20 DIAGNOSIS — K760 Fatty (change of) liver, not elsewhere classified: Secondary | ICD-10-CM | POA: Diagnosis not present

## 2022-11-20 DIAGNOSIS — D46Z Other myelodysplastic syndromes: Secondary | ICD-10-CM

## 2022-11-20 DIAGNOSIS — N183 Chronic kidney disease, stage 3 unspecified: Secondary | ICD-10-CM | POA: Insufficient documentation

## 2022-11-20 DIAGNOSIS — I129 Hypertensive chronic kidney disease with stage 1 through stage 4 chronic kidney disease, or unspecified chronic kidney disease: Secondary | ICD-10-CM | POA: Insufficient documentation

## 2022-11-20 DIAGNOSIS — F0390 Unspecified dementia without behavioral disturbance: Secondary | ICD-10-CM | POA: Diagnosis not present

## 2022-11-20 DIAGNOSIS — Z79899 Other long term (current) drug therapy: Secondary | ICD-10-CM | POA: Insufficient documentation

## 2022-11-20 DIAGNOSIS — C9200 Acute myeloblastic leukemia, not having achieved remission: Secondary | ICD-10-CM | POA: Diagnosis not present

## 2022-11-20 DIAGNOSIS — Z5111 Encounter for antineoplastic chemotherapy: Secondary | ICD-10-CM | POA: Diagnosis not present

## 2022-11-20 DIAGNOSIS — Z95828 Presence of other vascular implants and grafts: Secondary | ICD-10-CM

## 2022-11-20 DIAGNOSIS — R918 Other nonspecific abnormal finding of lung field: Secondary | ICD-10-CM | POA: Insufficient documentation

## 2022-11-20 LAB — CBC WITH DIFFERENTIAL/PLATELET
Abs Immature Granulocytes: 0.07 10*3/uL (ref 0.00–0.07)
Basophils Absolute: 0 10*3/uL (ref 0.0–0.1)
Basophils Relative: 1 %
Eosinophils Absolute: 0.3 10*3/uL (ref 0.0–0.5)
Eosinophils Relative: 4 %
HCT: 44.8 % (ref 39.0–52.0)
Hemoglobin: 14.7 g/dL (ref 13.0–17.0)
Immature Granulocytes: 1 %
Lymphocytes Relative: 19 %
Lymphs Abs: 1.2 10*3/uL (ref 0.7–4.0)
MCH: 31.6 pg (ref 26.0–34.0)
MCHC: 32.8 g/dL (ref 30.0–36.0)
MCV: 96.3 fL (ref 80.0–100.0)
Monocytes Absolute: 0.5 10*3/uL (ref 0.1–1.0)
Monocytes Relative: 7 %
Neutro Abs: 4.5 10*3/uL (ref 1.7–7.7)
Neutrophils Relative %: 68 %
Platelets: 155 10*3/uL (ref 150–400)
RBC: 4.65 MIL/uL (ref 4.22–5.81)
RDW: 15.8 % — ABNORMAL HIGH (ref 11.5–15.5)
WBC: 6.6 10*3/uL (ref 4.0–10.5)
nRBC: 0 % (ref 0.0–0.2)

## 2022-11-20 LAB — COMPREHENSIVE METABOLIC PANEL
ALT: 32 U/L (ref 0–44)
AST: 26 U/L (ref 15–41)
Albumin: 3.7 g/dL (ref 3.5–5.0)
Alkaline Phosphatase: 67 U/L (ref 38–126)
Anion gap: 6 (ref 5–15)
BUN: 20 mg/dL (ref 8–23)
CO2: 25 mmol/L (ref 22–32)
Calcium: 9 mg/dL (ref 8.9–10.3)
Chloride: 107 mmol/L (ref 98–111)
Creatinine, Ser: 1 mg/dL (ref 0.61–1.24)
GFR, Estimated: >60 mL/min (ref >60)
Glucose, Bld: 93 mg/dL (ref 70–99)
Potassium: 4.3 mmol/L (ref 3.5–5.1)
Sodium: 138 mmol/L (ref 135–145)
Total Bilirubin: 0.7 mg/dL (ref 0.3–1.2)
Total Protein: 6.2 g/dL — ABNORMAL LOW (ref 6.5–8.1)

## 2022-11-20 LAB — MAGNESIUM: Magnesium: 1.9 mg/dL (ref 1.7–2.4)

## 2022-11-20 MED ORDER — OXYCODONE HCL 5 MG PO TABS: 5.0000 mg | ORAL_TABLET | Freq: Four times a day (QID) | ORAL | 0 refills | Status: DC | PRN

## 2022-11-20 MED ORDER — SODIUM CHLORIDE 0.9% FLUSH
10.0000 mL | INTRAVENOUS | Status: DC | PRN
  Administered 2022-11-20: 10 mL

## 2022-11-20 MED ORDER — SODIUM CHLORIDE 0.9 % IV SOLN: Freq: Once | INTRAVENOUS | Status: AC

## 2022-11-20 MED ORDER — SODIUM CHLORIDE 0.9 % IV SOLN
15.0000 mg/m2 | Freq: Once | INTRAVENOUS | Status: AC
  Administered 2022-11-20: 35 mg via INTRAVENOUS
  Filled 2022-11-20: qty 7

## 2022-11-20 MED ORDER — HEPARIN SOD (PORK) LOCK FLUSH 100 UNIT/ML IV SOLN
500.0000 [IU] | Freq: Once | INTRAVENOUS | Status: AC | PRN
  Administered 2022-11-20: 500 [IU]

## 2022-11-20 MED ORDER — ONDANSETRON HCL 4 MG/2ML IJ SOLN
4.0000 mg | Freq: Once | INTRAMUSCULAR | Status: AC
  Administered 2022-11-20: 4 mg via INTRAVENOUS
  Filled 2022-11-20: qty 2

## 2022-11-20 NOTE — Progress Notes (Signed)
Patient is taking Venetoclax as prescribed.  He has not missed any doses and reports no side effects at this time.    Patient has been examined by Dr. Ellin Saba. Vital signs and labs have been reviewed by MD - ANC, Creatinine, LFTs, hemoglobin, and platelets are within treatment parameters per M.D. - pt may proceed with treatment.  Primary RN and pharmacy notified.

## 2022-11-20 NOTE — Patient Instructions (Signed)

## 2022-11-20 NOTE — Progress Notes (Signed)

## 2022-11-20 NOTE — Patient Instructions (Signed)
[facility]  Discharge Instructions: Thank you for choosing [facility] to provide your oncology and hematology care.  If you have a lab appointment with the Cancer Center - please note that after April 8th, 2024, all labs will be drawn in the cancer center.  You do not have to check in or register with the main entrance as you have in the past but will complete your check-in in the cancer center.  Wear comfortable clothing and clothing appropriate for easy access to any Portacath or PICC line.   We strive to give you quality time with your provider. You may need to reschedule your appointment if you arrive late (15 or more minutes).  Arriving late affects you and other patients whose appointments are after yours.  Also, if you miss three or more appointments without notifying the office, you may be dismissed from the clinic at the provider's discretion.      For prescription refill requests, have your pharmacy contact our office and allow 72 hours for refills to be completed.    Today you received the following chemotherapy and/or immunotherapy agents dacogen       To help prevent nausea and vomiting after your treatment, we encourage you to take your nausea medication as directed.  BELOW ARE SYMPTOMS THAT SHOULD BE REPORTED IMMEDIATELY: *FEVER GREATER THAN 100.4 F (38 C) OR HIGHER *CHILLS OR SWEATING *NAUSEA AND VOMITING THAT IS NOT CONTROLLED WITH YOUR NAUSEA MEDICATION *UNUSUAL SHORTNESS OF BREATH *UNUSUAL BRUISING OR BLEEDING *URINARY PROBLEMS (pain or burning when urinating, or frequent urination) *BOWEL PROBLEMS (unusual diarrhea, constipation, pain near the anus) TENDERNESS IN MOUTH AND THROAT WITH OR WITHOUT PRESENCE OF ULCERS (sore throat, sores in mouth, or a toothache) UNUSUAL RASH, SWELLING OR PAIN  UNUSUAL VAGINAL DISCHARGE OR ITCHING   Items with * indicate a potential emergency and should be followed up as soon as possible or go to the  Emergency Department if any problems should occur.  Please show the CHEMOTHERAPY ALERT CARD or IMMUNOTHERAPY ALERT CARD at check-in to the Emergency Department and triage nurse.  Should you have questions after your visit or need to cancel or reschedule your appointment, please contact [facility] [phone]  and follow the prompts.  Office hours are 8:00 a.m. to 4:30 p.m. Monday - Friday. Please note that voicemails left after 4:00 p.m. may not be returned until the following business day.  We are closed weekends and major holidays. You have access to a nurse at all times for urgent questions. Please call the main number to the clinic [phone] and follow the prompts.  For any non-urgent questions, you may also contact your provider using MyChart. We now offer e-Visits for anyone 66 and older to request care online for non-urgent symptoms. For details visit [email or web address].   Also download the MyChart app! Go to the app store, search "MyChart", open the app, select [facility], and log in with your MyChart username and password.

## 2022-11-21 ENCOUNTER — Inpatient Hospital Stay: Payer: Medicare Other

## 2022-11-21 VITALS — BP 139/77 | HR 66 | Temp 97.9°F | Resp 18

## 2022-11-21 DIAGNOSIS — Z5111 Encounter for antineoplastic chemotherapy: Secondary | ICD-10-CM | POA: Diagnosis not present

## 2022-11-21 DIAGNOSIS — Z95828 Presence of other vascular implants and grafts: Secondary | ICD-10-CM

## 2022-11-21 DIAGNOSIS — D46Z Other myelodysplastic syndromes: Secondary | ICD-10-CM

## 2022-11-21 DIAGNOSIS — C9200 Acute myeloblastic leukemia, not having achieved remission: Secondary | ICD-10-CM

## 2022-11-21 MED ORDER — HEPARIN SOD (PORK) LOCK FLUSH 100 UNIT/ML IV SOLN
500.0000 [IU] | Freq: Once | INTRAVENOUS | Status: AC | PRN
  Administered 2022-11-21: 500 [IU]

## 2022-11-21 MED ORDER — SODIUM CHLORIDE 0.9 % IV SOLN
15.0000 mg/m2 | Freq: Once | INTRAVENOUS | Status: AC
  Administered 2022-11-21: 35 mg via INTRAVENOUS
  Filled 2022-11-21: qty 7

## 2022-11-21 MED ORDER — ONDANSETRON HCL 4 MG/2ML IJ SOLN
4.0000 mg | Freq: Once | INTRAMUSCULAR | Status: AC
  Administered 2022-11-21: 4 mg via INTRAVENOUS
  Filled 2022-11-21: qty 2

## 2022-11-21 MED ORDER — SODIUM CHLORIDE 0.9% FLUSH
10.0000 mL | INTRAVENOUS | Status: DC | PRN
  Administered 2022-11-21: 10 mL

## 2022-11-21 MED ORDER — SODIUM CHLORIDE 0.9 % IV SOLN: Freq: Once | INTRAVENOUS | Status: AC

## 2022-11-21 NOTE — Progress Notes (Signed)
Patient presents today for D2 Decitabine infusion.  Patient is in satisfactory condition with no new complaints voiced.  Vital signs are stable.  Labs reviewed and all labs are within treatment parameters.  We will proceed with treatment per MD orders.    Treatment given today per MD orders. Tolerated infusion without adverse affects. Vital signs stable. No complaints at this time. Discharged from clinic via motorized scooter in stable condition. Alert and oriented x 3. F/U with [facility] as scheduled.

## 2022-11-21 NOTE — Patient Instructions (Signed)
[facility]  Discharge Instructions: Thank you for choosing [facility] to provide your oncology and hematology care.  If you have a lab appointment with the Cancer Center - please note that after April 8th, 2024, all labs will be drawn in the cancer center.  You do not have to check in or register with the main entrance as you have in the past but will complete your check-in in the cancer center.  Wear comfortable clothing and clothing appropriate for easy access to any Portacath or PICC line.   We strive to give you quality time with your provider. You may need to reschedule your appointment if you arrive late (15 or more minutes).  Arriving late affects you and other patients whose appointments are after yours.  Also, if you miss three or more appointments without notifying the office, you may be dismissed from the clinic at the provider's discretion.      For prescription refill requests, have your pharmacy contact our office and allow 72 hours for refills to be completed.    Today you received the following chemotherapy and/or immunotherapy agents D2 Dectiabine   To help prevent nausea and vomiting after your treatment, we encourage you to take your nausea medication as directed.  Decitabine Injection What is this medication? DECITABINE (dee SYE ta been) treats blood and bone marrow cancers. It works by slowing down the growth of cancer cells. This medicine may be used for other purposes; ask your health care provider or pharmacist if you have questions. COMMON BRAND NAME(S): Dacogen What should I tell my care team before I take this medication? They need to know if you have any of these conditions: Infection Kidney disease Liver disease An unusual or allergic reaction to decitabine, other medications, foods, dyes, or preservatives Pregnant or trying to get pregnant Breast-feeding How should I use this medication? This medication is infused into a  vein. It is given by your care team in a hospital or clinic setting. Talk to your care team about the use of this medication in children. Special care may be needed. Overdosage: If you think you have taken too much of this medicine contact a poison control center or emergency room at once. NOTE: This medicine is only for you. Do not share this medicine with others. What if I miss a dose? Keep appointments for follow-up doses. It is important not to miss your dose. Call your care team if you are unable to keep an appointment. What may interact with this medication? Interactions are not expected. This list may not describe all possible interactions. Give your health care provider a list of all the medicines, herbs, non-prescription drugs, or dietary supplements you use. Also tell them if you smoke, drink alcohol, or use illegal drugs. Some items may interact with your medicine. What should I watch for while using this medication? Visit your care team for regular checks on your progress. You may need blood work while taking this medication. This medication may make you feel generally unwell. This is not uncommon as chemotherapy can affect healthy cells as well as cancer cells. Report any side effects. Continue your course of treatment even though you feel ill unless your care team tells you to stop. This medication may increase your risk of getting an infection. Call your care team for advice if you get a fever, chills, sore throat, or other symptoms of a cold or flu. Do not treat yourself. Try to avoid being around  people who are sick. Be careful brushing or flossing your teeth or using a toothpick because you may get an infection or bleed more easily. If you have any dental work done, tell your dentist you are receiving this medication. Call your care team if you are around anyone with measles, chickenpox, or if you develop sores or blisters that do not heal properly. Avoid taking medications that  contain aspirin, acetaminophen, ibuprofen, naproxen, or ketoprofen unless instructed by your care team. These medications may hide a fever. Talk to your care team if you or your partner wish to become pregnant or think either of you might be pregnant. This medication can cause serious birth defects if taken during pregnancy or for 6 months after the last dose. A negative pregnancy test is required before starting this medication. A reliable form of contraception is recommended while taking this medication and for 6 months after the last dose. Talk to your care team about reliable forms of contraception. Do not father a child while taking this medication or for 3 months after the last dose. Use a condom while having sex during this time period. Do not breast-feed while taking this medication and for 2 weeks after the last dose. This medication may cause infertility. Talk to your care team if you are concerned about your fertility. What side effects may I notice from receiving this medication? Side effects that you should report to your care team as soon as possible: Allergic reactions--skin rash, itching, hives, swelling of the face, lips, tongue, or throat Infection--fever, chills, cough, sore throat, wounds that don't heal, pain or trouble when passing urine, general feeling of discomfort or being unwell Low red blood cell level--unusual weakness or fatigue, dizziness, headache, trouble breathing Unusual bruising or bleeding Side effects that usually do not require medical attention (report to your care team if they continue or are bothersome): Constipation Diarrhea Fatigue Nausea Pain, redness, or swelling with sores inside the mouth or throat Stomach pain This list may not describe all possible side effects. Call your doctor for medical advice about side effects. You may report side effects to FDA at 1-800-FDA-1088. Where should I keep my medication? This medication is given in a hospital or  clinic. It will not be stored at home. NOTE: This sheet is a summary. It may not cover all possible information. If you have questions about this medicine, talk to your doctor, pharmacist, or health care provider.  2024 Elsevier/Gold Standard (2021-08-03 00:00:00)   BELOW ARE SYMPTOMS THAT SHOULD BE REPORTED IMMEDIATELY: *FEVER GREATER THAN 100.4 F (38 C) OR HIGHER *CHILLS OR SWEATING *NAUSEA AND VOMITING THAT IS NOT CONTROLLED WITH YOUR NAUSEA MEDICATION *UNUSUAL SHORTNESS OF BREATH *UNUSUAL BRUISING OR BLEEDING *URINARY PROBLEMS (pain or burning when urinating, or frequent urination) *BOWEL PROBLEMS (unusual diarrhea, constipation, pain near the anus) TENDERNESS IN MOUTH AND THROAT WITH OR WITHOUT PRESENCE OF ULCERS (sore throat, sores in mouth, or a toothache) UNUSUAL RASH, SWELLING OR PAIN  UNUSUAL VAGINAL DISCHARGE OR ITCHING   Items with * indicate a potential emergency and should be followed up as soon as possible or go to the Emergency Department if any problems should occur.  Please show the CHEMOTHERAPY ALERT CARD or IMMUNOTHERAPY ALERT CARD at check-in to the Emergency Department and triage nurse.  Should you have questions after your visit or need to cancel or reschedule your appointment, please contact [facility] [phone]  and follow the prompts.  Office hours are 8:00 a.m. to 4:30  p.m. Monday - Friday. Please note that voicemails left after 4:00 p.m. may not be returned until the following business day.  We are closed weekends and major holidays. You have access to a nurse at all times for urgent questions. Please call the main number to the clinic [phone] and follow the prompts.  For any non-urgent questions, you may also contact your provider using MyChart. We now offer e-Visits for anyone 16 and older to request care online for non-urgent symptoms. For details visit [email or web address].   Also download the MyChart app! Go to the app  store, search "MyChart", open the app, select [place], and log in with your MyChart username and password.

## 2022-11-22 ENCOUNTER — Inpatient Hospital Stay: Payer: Medicare Other

## 2022-11-22 VITALS — BP 147/90 | HR 77 | Temp 97.8°F | Resp 17

## 2022-11-22 DIAGNOSIS — Z5111 Encounter for antineoplastic chemotherapy: Secondary | ICD-10-CM | POA: Diagnosis not present

## 2022-11-22 DIAGNOSIS — Z95828 Presence of other vascular implants and grafts: Secondary | ICD-10-CM

## 2022-11-22 DIAGNOSIS — D46Z Other myelodysplastic syndromes: Secondary | ICD-10-CM

## 2022-11-22 DIAGNOSIS — C9200 Acute myeloblastic leukemia, not having achieved remission: Secondary | ICD-10-CM

## 2022-11-22 MED ORDER — ONDANSETRON HCL 4 MG/2ML IJ SOLN
4.0000 mg | Freq: Once | INTRAMUSCULAR | Status: AC
  Administered 2022-11-22: 4 mg via INTRAVENOUS
  Filled 2022-11-22: qty 2

## 2022-11-22 MED ORDER — SODIUM CHLORIDE 0.9 % IV SOLN: Freq: Once | INTRAVENOUS | Status: AC

## 2022-11-22 MED ORDER — HEPARIN SOD (PORK) LOCK FLUSH 100 UNIT/ML IV SOLN
500.0000 [IU] | Freq: Once | INTRAVENOUS | Status: AC | PRN
  Administered 2022-11-22: 500 [IU]

## 2022-11-22 MED ORDER — SODIUM CHLORIDE 0.9 % IV SOLN
15.0000 mg/m2 | Freq: Once | INTRAVENOUS | Status: AC
  Administered 2022-11-22: 35 mg via INTRAVENOUS
  Filled 2022-11-22: qty 7

## 2022-11-22 MED ORDER — SODIUM CHLORIDE 0.9% FLUSH
10.0000 mL | INTRAVENOUS | Status: DC | PRN
  Administered 2022-11-22: 10 mL

## 2022-11-22 NOTE — Patient Instructions (Signed)
[facility] [place]  Discharge Instructions: Thank you for choosing [facility] to provide your oncology and hematology care.  If you have a lab appointment with the Cancer Center - please note that after April 8th, 2024, all labs will be drawn in the cancer center.  You do not have to check in or register with the main entrance as you have in the past but will complete your check-in in the cancer center.  Wear comfortable clothing and clothing appropriate for easy access to any Portacath or PICC line.   We strive to give you quality time with your provider. You may need to reschedule your appointment if you arrive late (15 or more minutes).  Arriving late affects you and other patients whose appointments are after yours.  Also, if you miss three or more appointments without notifying the office, you may be dismissed from the clinic at the provider's discretion.      For prescription refill requests, have your pharmacy contact our office and allow 72 hours for refills to be completed.    Today you received the following chemotherapy and/or immunotherapy agents Decitabine.  Decitabine Injection What is this medication? DECITABINE (dee SYE ta been) treats blood and bone marrow cancers. It works by slowing down the growth of cancer cells. This medicine may be used for other purposes; ask your health care provider or pharmacist if you have questions. COMMON BRAND NAME(S): Dacogen What should I tell my care team before I take this medication? They need to know if you have any of these conditions: Infection Kidney disease Liver disease An unusual or allergic reaction to decitabine, other medications, foods, dyes, or preservatives Pregnant or trying to get pregnant Breast-feeding How should I use this medication? This medication is infused into a vein. It is given by your care team in a hospital or clinic setting. Talk to your care team about the use of this medication  in children. Special care may be needed. Overdosage: If you think you have taken too much of this medicine contact a poison control center or emergency room at once. NOTE: This medicine is only for you. Do not share this medicine with others. What if I miss a dose? Keep appointments for follow-up doses. It is important not to miss your dose. Call your care team if you are unable to keep an appointment. What may interact with this medication? Interactions are not expected. This list may not describe all possible interactions. Give your health care provider a list of all the medicines, herbs, non-prescription drugs, or dietary supplements you use. Also tell them if you smoke, drink alcohol, or use illegal drugs. Some items may interact with your medicine. What should I watch for while using this medication? Visit your care team for regular checks on your progress. You may need blood work while taking this medication. This medication may make you feel generally unwell. This is not uncommon as chemotherapy can affect healthy cells as well as cancer cells. Report any side effects. Continue your course of treatment even though you feel ill unless your care team tells you to stop. This medication may increase your risk of getting an infection. Call your care team for advice if you get a fever, chills, sore throat, or other symptoms of a cold or flu. Do not treat yourself. Try to avoid being around people who are sick. Be careful brushing or flossing your teeth or using a toothpick because you may get an infection or   bleed more easily. If you have any dental work done, tell your dentist you are receiving this medication. Call your care team if you are around anyone with measles, chickenpox, or if you develop sores or blisters that do not heal properly. Avoid taking medications that contain aspirin, acetaminophen, ibuprofen, naproxen, or ketoprofen unless instructed by your care team. These medications may  hide a fever. Talk to your care team if you or your partner wish to become pregnant or think either of you might be pregnant. This medication can cause serious birth defects if taken during pregnancy or for 6 months after the last dose. A negative pregnancy test is required before starting this medication. A reliable form of contraception is recommended while taking this medication and for 6 months after the last dose. Talk to your care team about reliable forms of contraception. Do not father a child while taking this medication or for 3 months after the last dose. Use a condom while having sex during this time period. Do not breast-feed while taking this medication and for 2 weeks after the last dose. This medication may cause infertility. Talk to your care team if you are concerned about your fertility. What side effects may I notice from receiving this medication? Side effects that you should report to your care team as soon as possible: Allergic reactions--skin rash, itching, hives, swelling of the face, lips, tongue, or throat Infection--fever, chills, cough, sore throat, wounds that don't heal, pain or trouble when passing urine, general feeling of discomfort or being unwell Low red blood cell level--unusual weakness or fatigue, dizziness, headache, trouble breathing Unusual bruising or bleeding Side effects that usually do not require medical attention (report to your care team if they continue or are bothersome): Constipation Diarrhea Fatigue Nausea Pain, redness, or swelling with sores inside the mouth or throat Stomach pain This list may not describe all possible side effects. Call your doctor for medical advice about side effects. You may report side effects to FDA at 1-800-FDA-1088. Where should I keep my medication? This medication is given in a hospital or clinic. It will not be stored at home. NOTE: This sheet is a summary. It may not cover all possible information. If you have  questions about this medicine, talk to your doctor, pharmacist, or health care provider.  2024 Elsevier/Gold Standard (2021-08-03 00:00:00)        To help prevent nausea and vomiting after your treatment, we encourage you to take your nausea medication as directed.  BELOW ARE SYMPTOMS THAT SHOULD BE REPORTED IMMEDIATELY: *FEVER GREATER THAN 100.4 F (38 C) OR HIGHER *CHILLS OR SWEATING *NAUSEA AND VOMITING THAT IS NOT CONTROLLED WITH YOUR NAUSEA MEDICATION *UNUSUAL SHORTNESS OF BREATH *UNUSUAL BRUISING OR BLEEDING *URINARY PROBLEMS (pain or burning when urinating, or frequent urination) *BOWEL PROBLEMS (unusual diarrhea, constipation, pain near the anus) TENDERNESS IN MOUTH AND THROAT WITH OR WITHOUT PRESENCE OF ULCERS (sore throat, sores in mouth, or a toothache) UNUSUAL RASH, SWELLING OR PAIN  UNUSUAL VAGINAL DISCHARGE OR ITCHING   Items with * indicate a potential emergency and should be followed up as soon as possible or go to the Emergency Department if any problems should occur.  Please show the CHEMOTHERAPY ALERT CARD or IMMUNOTHERAPY ALERT CARD at check-in to the Emergency Department and triage nurse.  Should you have questions after your visit or need to cancel or reschedule your appointment, please contact [facility] [place] [phone]  and follow the prompts.  Office hours are 8:00   a.m. to 4:30 p.m. Monday - Friday. Please note that voicemails left after 4:00 p.m. may not be returned until the following business day.  We are closed weekends and major holidays. You have access to a nurse at all times for urgent questions. Please call the main number to the clinic [phone] and follow the prompts.  For any non-urgent questions, you may also contact your provider using MyChart. We now offer e-Visits for anyone 18 and older to request care online for non-urgent symptoms. For details visit [email or web address].   Also download the MyChart app! Go to the app  store, search "MyChart", open the app, select [place], and log in with your MyChart username and password.   

## 2022-11-22 NOTE — Progress Notes (Signed)
Patient presents today for Decitabine infusion.  Patient is in satisfactory condition with no new complaints voiced.  Vital signs are stable.  Labs from 11/20/22 reviewed by MD.  We will proceed with treatment per MD orders.    Patient tolerated treatment well with no complaints voiced.  Patient left via motorized wheelchair in stable condition.  Vital signs stable at discharge.  Follow up as scheduled.

## 2022-11-23 ENCOUNTER — Inpatient Hospital Stay: Payer: Medicare Other

## 2022-11-23 VITALS — BP 141/87 | HR 72 | Temp 97.9°F | Resp 18

## 2022-11-23 DIAGNOSIS — C9200 Acute myeloblastic leukemia, not having achieved remission: Secondary | ICD-10-CM

## 2022-11-23 DIAGNOSIS — D46Z Other myelodysplastic syndromes: Secondary | ICD-10-CM

## 2022-11-23 DIAGNOSIS — Z95828 Presence of other vascular implants and grafts: Secondary | ICD-10-CM

## 2022-11-23 DIAGNOSIS — Z5111 Encounter for antineoplastic chemotherapy: Secondary | ICD-10-CM | POA: Diagnosis not present

## 2022-11-23 MED ORDER — ONDANSETRON HCL 4 MG/2ML IJ SOLN
4.0000 mg | Freq: Once | INTRAMUSCULAR | Status: AC
  Administered 2022-11-23: 4 mg via INTRAVENOUS
  Filled 2022-11-23: qty 2

## 2022-11-23 MED ORDER — SODIUM CHLORIDE 0.9 % IV SOLN: Freq: Once | INTRAVENOUS | Status: AC

## 2022-11-23 MED ORDER — SODIUM CHLORIDE 0.9% FLUSH
10.0000 mL | INTRAVENOUS | Status: DC | PRN
  Administered 2022-11-23: 10 mL

## 2022-11-23 MED ORDER — HEPARIN SOD (PORK) LOCK FLUSH 100 UNIT/ML IV SOLN
500.0000 [IU] | Freq: Once | INTRAVENOUS | Status: AC | PRN
  Administered 2022-11-23: 500 [IU]

## 2022-11-23 MED ORDER — SODIUM CHLORIDE 0.9 % IV SOLN
15.0000 mg/m2 | Freq: Once | INTRAVENOUS | Status: AC
  Administered 2022-11-23: 35 mg via INTRAVENOUS
  Filled 2022-11-23: qty 7

## 2022-11-23 NOTE — Patient Instructions (Signed)
[facility] [place]  Discharge Instructions: Thank you for choosing [facility] to provide your oncology and hematology care.  If you have a lab appointment with the Cancer Center - please note that after April 8th, 2024, all labs will be drawn in the cancer center.  You do not have to check in or register with the main entrance as you have in the past but will complete your check-in in the cancer center.  Wear comfortable clothing and clothing appropriate for easy access to any Portacath or PICC line.   We strive to give you quality time with your provider. You may need to reschedule your appointment if you arrive late (15 or more minutes).  Arriving late affects you and other patients whose appointments are after yours.  Also, if you miss three or more appointments without notifying the office, you may be dismissed from the clinic at the provider's discretion.      For prescription refill requests, have your pharmacy contact our office and allow 72 hours for refills to be completed.    Today you received the following chemotherapy and/or immunotherapy agents Decitabine.  Decitabine Injection What is this medication? DECITABINE (dee SYE ta been) treats blood and bone marrow cancers. It works by slowing down the growth of cancer cells. This medicine may be used for other purposes; ask your health care provider or pharmacist if you have questions. COMMON BRAND NAME(S): Dacogen What should I tell my care team before I take this medication? They need to know if you have any of these conditions: Infection Kidney disease Liver disease An unusual or allergic reaction to decitabine, other medications, foods, dyes, or preservatives Pregnant or trying to get pregnant Breast-feeding How should I use this medication? This medication is infused into a vein. It is given by your care team in a hospital or clinic setting. Talk to your care team about the use of this medication  in children. Special care may be needed. Overdosage: If you think you have taken too much of this medicine contact a poison control center or emergency room at once. NOTE: This medicine is only for you. Do not share this medicine with others. What if I miss a dose? Keep appointments for follow-up doses. It is important not to miss your dose. Call your care team if you are unable to keep an appointment. What may interact with this medication? Interactions are not expected. This list may not describe all possible interactions. Give your health care provider a list of all the medicines, herbs, non-prescription drugs, or dietary supplements you use. Also tell them if you smoke, drink alcohol, or use illegal drugs. Some items may interact with your medicine. What should I watch for while using this medication? Visit your care team for regular checks on your progress. You may need blood work while taking this medication. This medication may make you feel generally unwell. This is not uncommon as chemotherapy can affect healthy cells as well as cancer cells. Report any side effects. Continue your course of treatment even though you feel ill unless your care team tells you to stop. This medication may increase your risk of getting an infection. Call your care team for advice if you get a fever, chills, sore throat, or other symptoms of a cold or flu. Do not treat yourself. Try to avoid being around people who are sick. Be careful brushing or flossing your teeth or using a toothpick because you may get an infection or   bleed more easily. If you have any dental work done, tell your dentist you are receiving this medication. Call your care team if you are around anyone with measles, chickenpox, or if you develop sores or blisters that do not heal properly. Avoid taking medications that contain aspirin, acetaminophen, ibuprofen, naproxen, or ketoprofen unless instructed by your care team. These medications may  hide a fever. Talk to your care team if you or your partner wish to become pregnant or think either of you might be pregnant. This medication can cause serious birth defects if taken during pregnancy or for 6 months after the last dose. A negative pregnancy test is required before starting this medication. A reliable form of contraception is recommended while taking this medication and for 6 months after the last dose. Talk to your care team about reliable forms of contraception. Do not father a child while taking this medication or for 3 months after the last dose. Use a condom while having sex during this time period. Do not breast-feed while taking this medication and for 2 weeks after the last dose. This medication may cause infertility. Talk to your care team if you are concerned about your fertility. What side effects may I notice from receiving this medication? Side effects that you should report to your care team as soon as possible: Allergic reactions--skin rash, itching, hives, swelling of the face, lips, tongue, or throat Infection--fever, chills, cough, sore throat, wounds that don't heal, pain or trouble when passing urine, general feeling of discomfort or being unwell Low red blood cell level--unusual weakness or fatigue, dizziness, headache, trouble breathing Unusual bruising or bleeding Side effects that usually do not require medical attention (report to your care team if they continue or are bothersome): Constipation Diarrhea Fatigue Nausea Pain, redness, or swelling with sores inside the mouth or throat Stomach pain This list may not describe all possible side effects. Call your doctor for medical advice about side effects. You may report side effects to FDA at 1-800-FDA-1088. Where should I keep my medication? This medication is given in a hospital or clinic. It will not be stored at home. NOTE: This sheet is a summary. It may not cover all possible information. If you have  questions about this medicine, talk to your doctor, pharmacist, or health care provider.  2024 Elsevier/Gold Standard (2021-08-03 00:00:00)        To help prevent nausea and vomiting after your treatment, we encourage you to take your nausea medication as directed.  BELOW ARE SYMPTOMS THAT SHOULD BE REPORTED IMMEDIATELY: *FEVER GREATER THAN 100.4 F (38 C) OR HIGHER *CHILLS OR SWEATING *NAUSEA AND VOMITING THAT IS NOT CONTROLLED WITH YOUR NAUSEA MEDICATION *UNUSUAL SHORTNESS OF BREATH *UNUSUAL BRUISING OR BLEEDING *URINARY PROBLEMS (pain or burning when urinating, or frequent urination) *BOWEL PROBLEMS (unusual diarrhea, constipation, pain near the anus) TENDERNESS IN MOUTH AND THROAT WITH OR WITHOUT PRESENCE OF ULCERS (sore throat, sores in mouth, or a toothache) UNUSUAL RASH, SWELLING OR PAIN  UNUSUAL VAGINAL DISCHARGE OR ITCHING   Items with * indicate a potential emergency and should be followed up as soon as possible or go to the Emergency Department if any problems should occur.  Please show the CHEMOTHERAPY ALERT CARD or IMMUNOTHERAPY ALERT CARD at check-in to the Emergency Department and triage nurse.  Should you have questions after your visit or need to cancel or reschedule your appointment, please contact [facility] [place] [phone]  and follow the prompts.  Office hours are 8:00   a.m. to 4:30 p.m. Monday - Friday. Please note that voicemails left after 4:00 p.m. may not be returned until the following business day.  We are closed weekends and major holidays. You have access to a nurse at all times for urgent questions. Please call the main number to the clinic [phone] and follow the prompts.  For any non-urgent questions, you may also contact your provider using MyChart. We now offer e-Visits for anyone 18 and older to request care online for non-urgent symptoms. For details visit [email or web address].   Also download the MyChart app! Go to the app  store, search "MyChart", open the app, select [place], and log in with your MyChart username and password.   

## 2022-11-23 NOTE — Progress Notes (Signed)
Patient presents today for chemotherapy infusion day4 of Dectiabine.  Patient is in satisfactory condition with no new complaints voiced.  Vital signs are stable.  Labs reviewed and all labs are within treatment parameters.  We will proceed with treatment per MD orders.    Patient tolerated treatment well with no complaints voiced.  Patient left via wheelchair in stable condition.  Vital signs stable at discharge.  Follow up as scheduled.

## 2022-11-24 ENCOUNTER — Inpatient Hospital Stay: Payer: Medicare Other

## 2022-11-24 VITALS — BP 150/92 | HR 78 | Temp 98.4°F | Resp 18

## 2022-11-24 DIAGNOSIS — Z5111 Encounter for antineoplastic chemotherapy: Secondary | ICD-10-CM | POA: Diagnosis not present

## 2022-11-24 DIAGNOSIS — C9200 Acute myeloblastic leukemia, not having achieved remission: Secondary | ICD-10-CM

## 2022-11-24 DIAGNOSIS — Z95828 Presence of other vascular implants and grafts: Secondary | ICD-10-CM

## 2022-11-24 DIAGNOSIS — D46Z Other myelodysplastic syndromes: Secondary | ICD-10-CM

## 2022-11-24 MED ORDER — SODIUM CHLORIDE 0.9 % IV SOLN
15.0000 mg/m2 | Freq: Once | INTRAVENOUS | Status: AC
  Administered 2022-11-24: 35 mg via INTRAVENOUS
  Filled 2022-11-24: qty 7

## 2022-11-24 MED ORDER — ONDANSETRON HCL 4 MG/2ML IJ SOLN
4.0000 mg | Freq: Once | INTRAMUSCULAR | Status: AC
  Administered 2022-11-24: 4 mg via INTRAVENOUS
  Filled 2022-11-24: qty 2

## 2022-11-24 MED ORDER — SODIUM CHLORIDE 0.9 % IV SOLN: Freq: Once | INTRAVENOUS | Status: AC

## 2022-11-24 MED ORDER — SODIUM CHLORIDE 0.9% FLUSH
10.0000 mL | INTRAVENOUS | Status: DC | PRN
  Administered 2022-11-24: 10 mL

## 2022-11-24 MED ORDER — HEPARIN SOD (PORK) LOCK FLUSH 100 UNIT/ML IV SOLN
500.0000 [IU] | Freq: Once | INTRAVENOUS | Status: AC | PRN
  Administered 2022-11-24: 500 [IU]

## 2022-11-24 NOTE — Patient Instructions (Signed)
[facility] [place]  Discharge Instructions: Thank you for choosing [facility] to provide your oncology and hematology care.  If you have a lab appointment with the Cancer Center - please note that after April 8th, 2024, all labs will be drawn in the cancer center.  You do not have to check in or register with the main entrance as you have in the past but will complete your check-in in the cancer center.  Wear comfortable clothing and clothing appropriate for easy access to any Portacath or PICC line.   We strive to give you quality time with your provider. You may need to reschedule your appointment if you arrive late (15 or more minutes).  Arriving late affects you and other patients whose appointments are after yours.  Also, if you miss three or more appointments without notifying the office, you may be dismissed from the clinic at the provider's discretion.      For prescription refill requests, have your pharmacy contact our office and allow 72 hours for refills to be completed.    Today you received the following chemotherapy and/or immunotherapy agents Decitabine.  Decitabine Injection What is this medication? DECITABINE (dee SYE ta been) treats blood and bone marrow cancers. It works by slowing down the growth of cancer cells. This medicine may be used for other purposes; ask your health care provider or pharmacist if you have questions. COMMON BRAND NAME(S): Dacogen What should I tell my care team before I take this medication? They need to know if you have any of these conditions: Infection Kidney disease Liver disease An unusual or allergic reaction to decitabine, other medications, foods, dyes, or preservatives Pregnant or trying to get pregnant Breast-feeding How should I use this medication? This medication is infused into a vein. It is given by your care team in a hospital or clinic setting. Talk to your care team about the use of this medication  in children. Special care may be needed. Overdosage: If you think you have taken too much of this medicine contact a poison control center or emergency room at once. NOTE: This medicine is only for you. Do not share this medicine with others. What if I miss a dose? Keep appointments for follow-up doses. It is important not to miss your dose. Call your care team if you are unable to keep an appointment. What may interact with this medication? Interactions are not expected. This list may not describe all possible interactions. Give your health care provider a list of all the medicines, herbs, non-prescription drugs, or dietary supplements you use. Also tell them if you smoke, drink alcohol, or use illegal drugs. Some items may interact with your medicine. What should I watch for while using this medication? Visit your care team for regular checks on your progress. You may need blood work while taking this medication. This medication may make you feel generally unwell. This is not uncommon as chemotherapy can affect healthy cells as well as cancer cells. Report any side effects. Continue your course of treatment even though you feel ill unless your care team tells you to stop. This medication may increase your risk of getting an infection. Call your care team for advice if you get a fever, chills, sore throat, or other symptoms of a cold or flu. Do not treat yourself. Try to avoid being around people who are sick. Be careful brushing or flossing your teeth or using a toothpick because you may get an infection or   bleed more easily. If you have any dental work done, tell your dentist you are receiving this medication. Call your care team if you are around anyone with measles, chickenpox, or if you develop sores or blisters that do not heal properly. Avoid taking medications that contain aspirin, acetaminophen, ibuprofen, naproxen, or ketoprofen unless instructed by your care team. These medications may  hide a fever. Talk to your care team if you or your partner wish to become pregnant or think either of you might be pregnant. This medication can cause serious birth defects if taken during pregnancy or for 6 months after the last dose. A negative pregnancy test is required before starting this medication. A reliable form of contraception is recommended while taking this medication and for 6 months after the last dose. Talk to your care team about reliable forms of contraception. Do not father a child while taking this medication or for 3 months after the last dose. Use a condom while having sex during this time period. Do not breast-feed while taking this medication and for 2 weeks after the last dose. This medication may cause infertility. Talk to your care team if you are concerned about your fertility. What side effects may I notice from receiving this medication? Side effects that you should report to your care team as soon as possible: Allergic reactions--skin rash, itching, hives, swelling of the face, lips, tongue, or throat Infection--fever, chills, cough, sore throat, wounds that don't heal, pain or trouble when passing urine, general feeling of discomfort or being unwell Low red blood cell level--unusual weakness or fatigue, dizziness, headache, trouble breathing Unusual bruising or bleeding Side effects that usually do not require medical attention (report to your care team if they continue or are bothersome): Constipation Diarrhea Fatigue Nausea Pain, redness, or swelling with sores inside the mouth or throat Stomach pain This list may not describe all possible side effects. Call your doctor for medical advice about side effects. You may report side effects to FDA at 1-800-FDA-1088. Where should I keep my medication? This medication is given in a hospital or clinic. It will not be stored at home. NOTE: This sheet is a summary. It may not cover all possible information. If you have  questions about this medicine, talk to your doctor, pharmacist, or health care provider.  2024 Elsevier/Gold Standard (2021-08-03 00:00:00)        To help prevent nausea and vomiting after your treatment, we encourage you to take your nausea medication as directed.  BELOW ARE SYMPTOMS THAT SHOULD BE REPORTED IMMEDIATELY: *FEVER GREATER THAN 100.4 F (38 C) OR HIGHER *CHILLS OR SWEATING *NAUSEA AND VOMITING THAT IS NOT CONTROLLED WITH YOUR NAUSEA MEDICATION *UNUSUAL SHORTNESS OF BREATH *UNUSUAL BRUISING OR BLEEDING *URINARY PROBLEMS (pain or burning when urinating, or frequent urination) *BOWEL PROBLEMS (unusual diarrhea, constipation, pain near the anus) TENDERNESS IN MOUTH AND THROAT WITH OR WITHOUT PRESENCE OF ULCERS (sore throat, sores in mouth, or a toothache) UNUSUAL RASH, SWELLING OR PAIN  UNUSUAL VAGINAL DISCHARGE OR ITCHING   Items with * indicate a potential emergency and should be followed up as soon as possible or go to the Emergency Department if any problems should occur.  Please show the CHEMOTHERAPY ALERT CARD or IMMUNOTHERAPY ALERT CARD at check-in to the Emergency Department and triage nurse.  Should you have questions after your visit or need to cancel or reschedule your appointment, please contact [facility] [place] [phone]  and follow the prompts.  Office hours are 8:00   a.m. to 4:30 p.m. Monday - Friday. Please note that voicemails left after 4:00 p.m. may not be returned until the following business day.  We are closed weekends and major holidays. You have access to a nurse at all times for urgent questions. Please call the main number to the clinic [phone] and follow the prompts.  For any non-urgent questions, you may also contact your provider using MyChart. We now offer e-Visits for anyone 18 and older to request care online for non-urgent symptoms. For details visit [email or web address].   Also download the MyChart app! Go to the app  store, search "MyChart", open the app, select [place], and log in with your MyChart username and password.   

## 2022-11-24 NOTE — Progress Notes (Signed)
Patient presents today for day 5 Dectiabine chemotherapy infusion.  Patient is in satisfactory condition with no new complaints voiced.  Vital signs are stable.  Labs reviewed and all labs are within treatment parameters.  We will proceed with treatment per MD orders.    Patient tolerated treatment well with no complaints voiced.  Patient left via wheelchair in stable condition.  Vital signs stable at discharge.  Follow up as scheduled.

## 2022-12-29 ENCOUNTER — Other Ambulatory Visit: Payer: Self-pay

## 2022-12-29 DIAGNOSIS — D46Z Other myelodysplastic syndromes: Secondary | ICD-10-CM

## 2022-12-31 ENCOUNTER — Other Ambulatory Visit: Payer: Self-pay

## 2022-12-31 NOTE — Progress Notes (Signed)
[facility] [address], [place] [address]    Clinic Day:  01/01/2023  Referring physician: Billie Lade, MD  Patient Care Team: Billie Lade, MD as PCP - General (Internal Medicine) Doreatha Massed, MD as Medical Oncologist (Medical Oncology)   ASSESSMENT & PLAN:   Assessment: 1.  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  -BMBX on 02/25/2019 after cycle 1 did not show any evidence of leukemia. -CT CAP on 02/12/2019 shows numerous bilateral irregular/spiculated pulmonary nodules measuring up to 14 mm, nonspecific.  Hepatic steatosis.  Spleen is normal. -I have talked to Dr.Ravandi at [facility] per patient request.  There are clinical trials available upon progression of his AML.  There are also some clinical trials available now based on MRD positivity.  Patient not interested in moving to [place] at this time.    Plan: 1.  AML: - After last treatment, he had increasingly more episodes of confusion.  Also reported occasional dizziness with memory problems.  He fell 1 time in the yard after feeling dizzy and lost balance.  Did not have any major injuries. - Reviewed labs today: CBC was completely normal.  LFTs were normal. - Recommend proceeding with decitabine at 15 mg/m x 5 days.  Recommend holding venetoclax with this cycle. - Recommend follow-up with neurology.  He reportedly moved to a new house.  I have told him to stay away from power tools and driving. - RTC 6 weeks for follow-up.   2.  Arthralgias: - Continue oxycodone 5 mg daily as needed.   3.  Hypomagnesemia: - Continue magnesium 3 times daily.  Magnesium is 1.9.   4.  Hypertension: - Continue Norvasc 2.5 mg daily.  Blood pressure is 126/80.   5.  Peripheral neuropathy: - He has neuropathic pains in the feet.  He is taking gabapentin twice daily. - He is having occasional episodes of  confusion.  Gabapentin was started about 6 weeks ago.  He is also having memory problems.  Aricept was discontinued by neurology. - Recommend stopping gabapentin.  Try Lyrica 25 mg twice daily and may increase as needed.  We have sent a new prescription.  6.  Right leg swelling: - Continue Lasix 20 mg daily as needed.    No orders of the defined types were placed in this encounter.     I,Katie Daubenspeck,acting as a Neurosurgeon for Doreatha Massed, MD.,have documented all relevant documentation on the behalf of Doreatha Massed, MD,as directed by  Doreatha Massed, MD while in the presence of Doreatha Massed, MD.   I, Doreatha Massed MD, have reviewed the above documentation for accuracy and completeness, and I agree with the above.   Doreatha Massed, MD   8/26/20245:21 PM  CHIEF COMPLAINT:   Diagnosis: AML    Cancer Staging  No matching staging information was found for the patient.    Prior Therapy: decitabine and venetoclax, 8 cycles from 01/20/2019 - 11/17/2019   Current Therapy:  Decitabine every 6 weeks & venetoclax 200 mg x2 weeks after chemo    HISTORY OF PRESENT ILLNESS:   Oncology History  MDS (myelodysplastic syndrome), high grade (HCC)  08/14/2018 Initial Diagnosis   MDS (myelodysplastic syndrome), high grade (HCC)   08/22/2018 - 12/06/2018 Chemotherapy   The patient had palonosetron (ALOXI) injection 0.25 mg, 0.25 mg, Intravenous,  Once, 4 of 6 cycles Administration: 0.25 mg (08/22/2018), 0.25 mg (08/26/2018),  0.25 mg (08/28/2018), 0.25 mg (08/30/2018), 0.25 mg (10/01/2018), 0.25 mg (10/02/2018), 0.25 mg (11/04/2018), 0.25 mg (09/23/2018), 0.25 mg (09/25/2018), 0.25 mg (09/27/2018), 0.25 mg (10/28/2018), 0.25 mg (10/30/2018), 0.25 mg (11/01/2018), 0.25 mg (12/02/2018), 0.25 mg (12/04/2018), 0.25 mg (12/06/2018) azaCITIDine (VIDAZA) 100 mg in sodium chloride 0.9 % 50 mL chemo infusion, 110 mg (66.7 % of original dose 75 mg/m2), Intravenous, Once, 4 of 6  cycles Dose modification: 50 mg/m2 (original dose 75 mg/m2, Cycle 1, Reason: Provider Judgment), 50 mg/m2 (original dose 75 mg/m2, Cycle 2, Reason: Provider Judgment) Administration: 100 mg (08/22/2018), 100 mg (08/23/2018), 100 mg (08/26/2018), 100 mg (08/27/2018), 100 mg (08/28/2018), 100 mg (08/29/2018), 100 mg (08/30/2018), 100 mg (10/01/2018), 100 mg (10/02/2018), 100 mg (11/04/2018), 100 mg (11/05/2018), 100 mg (09/23/2018), 100 mg (09/24/2018), 100 mg (09/25/2018), 100 mg (09/26/2018), 100 mg (09/27/2018), 100 mg (10/28/2018), 100 mg (10/29/2018), 100 mg (10/30/2018), 100 mg (10/31/2018), 100 mg (11/01/2018), 100 mg (12/02/2018), 100 mg (12/03/2018), 100 mg (12/04/2018), 100 mg (12/05/2018), 100 mg (12/06/2018)  for chemotherapy treatment.    01/20/2019 - 12/02/2021 Chemotherapy   Patient is on Treatment Plan : MYELODYSPLASIA Decitabine D1-5 q42d     01/20/2019 -  Chemotherapy   Patient is on Treatment Plan : AML Decitabine D1-5 q 42d     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HCC)  01/07/2019 Initial Diagnosis   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HCC)   01/20/2019 - 12/02/2021 Chemotherapy   Patient is on Treatment Plan : MYELODYSPLASIA Decitabine D1-5 q42d     01/20/2019 -  Chemotherapy   Patient is on Treatment Plan : AML Decitabine D1-5 q 42d        INTERVAL HISTORY:   Richard Wallace is a 77 y.o. male presenting to clinic today for follow up of AML. He was last seen by me on 11/20/22.  Today, he states that he is doing well overall. His appetite level is at 100%. His energy level is at 25%.  PAST MEDICAL HISTORY:   Past Medical History: 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   Cancer (HCC) 01-28-2014   skin cancer   CKD (chronic kidney disease), stage III (HCC)    GERD (gastroesophageal reflux disease)    Heart murmur    NOTED DURING PHYSICAL WHEN HE WAS ENLISTING IN [place] , DIDNT KNOW UNTIL THAT TIME AND REPORTS , "THATS THE LAST I HEARD ABOUT IT "     History of hypertension    no longer issue   History of kidney stones    History of malignant melanoma of skin    excision top of scalp 2015-- no recurrence   History of urinary retention    post op lumbar fusion surgery 04/ 2016   Hypertension    Kidney dysfunction    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, MONITORED BY [facility] DR Jeannett Senior DAHLSTEDT    Left ureteral calculus    Seasonal allergies    Wears glasses    Wears glasses    Wears partial dentures    upper and lower    Surgical History: Past Surgical History:  Procedure Laterality Date   ANKLE FUSION Right 2007   CARPAL TUNNEL RELEASE Left 12/28/2009   w/ pulley release left long finger   CARPAL TUNNEL RELEASE Right 07/22/2013   Procedure: RIGHT CARPAL TUNNEL RELEASE;  Surgeon: Wyn Forster., MD;  Location: [facility];  Service: Orthopedics;  Laterality: Right;  COLONOSCOPY     CYSTO/  LEFT RETROGRADE PYELOGRAM  11/21/2010   CYSTOSCOPY WITH STENT PLACEMENT Left 03/09/2016   Procedure: CYSTOSCOPY WITH STENT PLACEMENT;  Surgeon: Marcine Matar, MD;  Location: [facility];  Service: Urology;  Laterality: Left;   CYSTOSCOPY/RETROGRADE/URETEROSCOPY/STONE EXTRACTION WITH BASKET Left 03/09/2016   Procedure: CYSTOSCOPY/RETROGRADE/URETEROSCOPY/STONE EXTRACTION WITH BASKET;  Surgeon: Marcine Matar, MD;  Location: [facility];  Service: Urology;  Laterality: Left;   LEFT URETEROSCOPIC LASER LITHOTRIPSY STONE EXTRACTION/ STENT PLACEMENT  05/23/2010   MOHS SURGERY     TOP OF THE HEAD    ORIF ANKLE FRACTURE Right 1978   PORT-A-CATH REMOVAL Right 02/14/2019   Procedure: MINOR REMOVAL PORT-A-CATH;  Surgeon: Franky Macho, MD;  Location: [facility];  Service: General;  Laterality: Right;   PORTACATH PLACEMENT Right 08/19/2018   Procedure: INSERTION PORT-A-CATH (attached catheter in right subclavian);  Surgeon: Franky Macho, MD;  Location: [facility];  Service: General;  Laterality:  Right;   PORTACATH PLACEMENT Left 05/16/2019   Procedure: INSERTION PORT-A-CATH (attached catheter in left subclavian);  Surgeon: Franky Macho, MD;  Location: [facility];  Service: General;  Laterality: Left;   POSTERIOR LUMBAR FUSION  08/21/2014   laminectomy and decompression L2 -- L5   RIGHT LOWER LEG SURGERY  X3  1975 to 1976   including ORIF   TONSILLECTOMY AND ADENOIDECTOMY  1986   UMBILICAL HERNIA REPAIR  2009 approx    Social History: Social History   Socioeconomic History   Marital status: Married    Spouse name: Not on file   Number of children: Not on file   Years of education: Not on file   Highest education level: Associate degree: occupational, Scientist, product/process development, or vocational program  Occupational History   Not on file  Tobacco Use   Smoking status: Former    Current packs/day: 0.00    Types: Cigarettes    Start date: 07/17/1966    Quit date: 07/17/1986    Years since quitting: 36.4   Smokeless tobacco: Never  Vaping Use   Vaping status: Never Used  Substance and Sexual Activity   Alcohol use: Yes    Alcohol/week: 7.0 - 14.0 standard drinks of alcohol    Types: 7 - 14 Cans of beer per week    Comment: 1 -2 beer daily   Drug use: No   Sexual activity: Not Currently  Other Topics Concern   Not on file  Social History Narrative   Not on file   Social Determinants of Health   Financial Resource Strain: Low Risk  (10/30/2022)   Overall Financial Resource Strain (CARDIA)    Difficulty of Paying Living Expenses: Not very hard  Food Insecurity: No Food Insecurity (10/30/2022)   Hunger Vital Sign    Worried About Running Out of Food in the Last Year: Never true    Ran Out of Food in the Last Year: Never true  Transportation Needs: No Transportation Needs (10/30/2022)   PRAPARE - Administrator, Civil Service (Medical): No    Lack of Transportation (Non-Medical): No  Physical Activity: Unknown (10/30/2022)   Exercise Vital Sign    Days of Exercise per Week: 0  days    Minutes of Exercise per Session: Not on file  Stress: No Stress Concern Present (10/30/2022)   [employer] of Occupational Health - Occupational Stress Questionnaire    Feeling of Stress : Not at all  Social Connections: Moderately Isolated (10/30/2022)   Social Connection and Isolation Panel [  NHANES]    Frequency of Communication with Friends and Family: Twice a week    Frequency of Social Gatherings with Friends and Family: Once a week    Attends Religious Services: Never    Database administrator or Organizations: No    Attends Engineer, structural: Not on file    Marital Status: Married  Intimate Partner Violence: Unknown (02/17/2019)   Humiliation, Afraid, Rape, and Kick questionnaire    Fear of Current or Ex-Partner: Patient declined    Emotionally Abused: Patient declined    Physically Abused: Patient declined    Sexually Abused: Patient declined    Family History: Family History  Problem Relation Age of Onset   Stroke Mother    Prostate cancer Father    Bone cancer Father    Diverticulitis Father    Rheum arthritis Sister    Urinary tract infection Sister    Colon cancer Neg Hx     Current Medications:  Current Outpatient Medications:    ACETAMINOPHEN EXTRA STRENGTH 500 MG capsule, , Disp: , Rfl:    allopurinol (ZYLOPRIM) 300 MG tablet, TAKE 1 TABLET BY MOUTH AT BEDTIME, Disp: 90 tablet, Rfl: 0   amLODipine (NORVASC) 5 MG tablet, Take 1 tablet (5 mg total) by mouth daily., Disp: 90 tablet, Rfl: 1   azelastine (ASTELIN) 0.1 % nasal spray, Place 1 spray into both nostrils 2 (two) times daily. Use in each nostril as directed, Disp: 30 mL, Rfl: 5   diclofenac Sodium (VOLTAREN) 1 % GEL, Apply three times daily to knees as needed for pain, Disp: 50 g, Rfl: 3   furosemide (LASIX) 20 MG tablet, TAKE 1 TABLET BY MOUTH ONCE DAILY AS NEEDED, Disp: 30 tablet, Rfl: 0   gabapentin (NEURONTIN) 300 MG capsule, Take 1 capsule (300 mg total) by mouth 3 (three)  times daily as needed. (Patient taking differently: Take 300 mg by mouth at bedtime. Takes at bedtime), Disp: 90 capsule, Rfl: 11   lidocaine-prilocaine (EMLA) cream, APPLY CREAM TOPICALLY ONE HOUR PRIOR TO CHEMOTHERAPY APPOINTMENT, Disp: 30 g, Rfl: 0   magnesium oxide (MAG-OX) 400 (240 Mg) MG tablet, TAKE 1 TABLET BY MOUTH THREE TIMES DAILY, Disp: 90 tablet, Rfl: 3   oxyCODONE (OXY IR/ROXICODONE) 5 MG immediate release tablet, Take 1 tablet (5 mg total) by mouth every 6 (six) hours as needed., Disp: 120 tablet, Rfl: 0   SF 5000 PLUS 1.1 % CREA dental cream, SMARTSIG:Sparingly By Mouth Every Night, Disp: , Rfl:    tamsulosin (FLOMAX) 0.4 MG CAPS capsule, Take 2 capsules (0.8 mg total) by mouth daily., Disp: 60 capsule, Rfl: 2   venetoclax (VENCLEXTA) 100 MG tablet, TAKE 2 TABLETS (200 MG) BY MOUTH DAILY, Disp: 60 tablet, Rfl: 2   pregabalin (LYRICA) 25 MG capsule, Take 1 capsule (25 mg total) by mouth 2 (two) times daily., Disp: 60 capsule, Rfl: 1 No current facility-administered medications for this visit.  Facility-Administered Medications Ordered in Other Visits:    octreotide (SANDOSTATIN LAR) 30 MG IM injection, , , ,    sodium chloride flush (NS) 0.9 % injection 10 mL, 10 mL, Intracatheter, PRN, Doreatha Massed, MD, 10 mL at 01/01/23 1250   sodium chloride flush (NS) 0.9 % injection 20 mL, 20 mL, Intravenous, PRN, Doreatha Massed, MD, 20 mL at 07/21/19 1053   Allergies: No Known Allergies  REVIEW OF SYSTEMS:   Review of Systems  Constitutional:  Negative for chills, fatigue and fever.  HENT:   Negative for lump/mass, mouth  sores, nosebleeds, sore throat and trouble swallowing.   Eyes:  Negative for eye problems.  Respiratory:  Negative for cough and shortness of breath.   Cardiovascular:  Negative for chest pain, leg swelling and palpitations.  Gastrointestinal:  Negative for abdominal pain, constipation, diarrhea, nausea and vomiting.  Genitourinary:  Negative for bladder  incontinence, difficulty urinating, dysuria, frequency, hematuria and nocturia.   Musculoskeletal:  Negative for arthralgias, back pain, flank pain, myalgias and neck pain.  Skin:  Negative for itching and rash.  Neurological:  Positive for dizziness and numbness. Negative for headaches.  Hematological:  Does not bruise/bleed easily.  Psychiatric/Behavioral:  Negative for depression, sleep disturbance and suicidal ideas. The patient is not nervous/anxious.   All other systems reviewed and are negative.    VITALS:   Blood pressure 126/80, pulse 84, temperature 97.7 F (36.5 C), temperature source Oral, resp. rate 16, weight 218 lb 6.4 oz (99.1 kg), SpO2 92%.  Wt Readings from Last 3 Encounters:  01/01/23 218 lb 6.4 oz (99.1 kg)  11/20/22 228 lb 1.6 oz (103.5 kg)  10/31/22 224 lb 3.2 oz (101.7 kg)    Body mass index is 31.79 kg/m.  Performance status (ECOG): 1 - Symptomatic but completely ambulatory  PHYSICAL EXAM:   Physical Exam Vitals and nursing note reviewed. Exam conducted with a chaperone present.  Constitutional:      Appearance: Normal appearance.  Cardiovascular:     Rate and Rhythm: Normal rate and regular rhythm.     Pulses: Normal pulses.     Heart sounds: Normal heart sounds.  Pulmonary:     Effort: Pulmonary effort is normal.     Breath sounds: Normal breath sounds.  Abdominal:     Palpations: Abdomen is soft. There is no hepatomegaly, splenomegaly or mass.     Tenderness: There is no abdominal tenderness.  Musculoskeletal:     Right lower leg: No edema.     Left lower leg: No edema.  Lymphadenopathy:     Cervical: No cervical adenopathy.     Right cervical: No superficial, deep or posterior cervical adenopathy.    Left cervical: No superficial, deep or posterior cervical adenopathy.     Upper Body:     Right upper body: No supraclavicular or axillary adenopathy.     Left upper body: No supraclavicular or axillary adenopathy.  Neurological:     General:  No focal deficit present.     Mental Status: He is alert and oriented to person, place, and time.  Psychiatric:        Mood and Affect: Mood normal.        Behavior: Behavior normal.     LABS:      Latest Ref Rng & Units 01/01/2023    9:43 AM 11/20/2022    9:43 AM 10/09/2022    9:37 AM  CBC  WBC 4.0 - 10.5 K/uL 6.2  6.6  7.3   Hemoglobin 13.0 - 17.0 g/dL 21.3  08.6  57.8   Hematocrit 39.0 - 52.0 % 46.7  44.8  46.6   Platelets 150 - 400 K/uL 155  155  138       Latest Ref Rng & Units 01/01/2023    9:43 AM 11/20/2022    9:43 AM 10/09/2022    9:37 AM  CMP  Glucose 70 - 99 mg/dL 469  93  96   BUN 8 - 23 mg/dL 22  20  26    Creatinine 0.61 - 1.24 mg/dL 6.29  5.28  0.96   Sodium 135 - 145 mmol/L 140  138  138   Potassium 3.5 - 5.1 mmol/L 3.5  4.3  4.1   Chloride 98 - 111 mmol/L 105  107  103   CO2 22 - 32 mmol/L 25  25  25    Calcium 8.9 - 10.3 mg/dL 9.3  9.0  9.2   Total Protein 6.5 - 8.1 g/dL 6.6  6.2  6.4   Total Bilirubin 0.3 - 1.2 mg/dL 0.8  0.7  0.5   Alkaline Phos 38 - 126 U/L 66  67  62   AST 15 - 41 U/L 27  26  22    ALT 0 - 44 U/L 35  32  27      No results found for: "CEA1", "CEA" / No results found for: "CEA1", "CEA" No results found for: "PSA1" No results found for: "HKV425" No results found for: "CAN125"  Lab Results  Component Value Date   TOTALPROTELP 6.2 07/08/2018   ALBUMINELP 3.8 07/08/2018   A1GS 0.2 07/08/2018   A2GS 0.6 07/08/2018   BETS 1.0 07/08/2018   GAMS 0.6 07/08/2018   MSPIKE Not Observed 07/08/2018   SPEI Comment 07/08/2018   Lab Results  Component Value Date   FERRITIN 429 (H) 07/08/2018   Lab Results  Component Value Date   LDH 154 02/27/2022   LDH 147 11/28/2021   LDH 157 10/17/2021     STUDIES:   No results found.

## 2023-01-01 ENCOUNTER — Inpatient Hospital Stay: Payer: Medicare Other | Admitting: Hematology

## 2023-01-01 ENCOUNTER — Inpatient Hospital Stay: Payer: Medicare Other | Attending: Hematology

## 2023-01-01 ENCOUNTER — Inpatient Hospital Stay: Payer: Medicare Other

## 2023-01-01 ENCOUNTER — Other Ambulatory Visit: Payer: Self-pay | Admitting: *Deleted

## 2023-01-01 VITALS — BP 126/76 | HR 71 | Temp 97.2°F | Resp 18

## 2023-01-01 DIAGNOSIS — Z8261 Family history of arthritis: Secondary | ICD-10-CM | POA: Diagnosis not present

## 2023-01-01 DIAGNOSIS — K219 Gastro-esophageal reflux disease without esophagitis: Secondary | ICD-10-CM | POA: Insufficient documentation

## 2023-01-01 DIAGNOSIS — Z85820 Personal history of malignant melanoma of skin: Secondary | ICD-10-CM | POA: Insufficient documentation

## 2023-01-01 DIAGNOSIS — Z95828 Presence of other vascular implants and grafts: Secondary | ICD-10-CM

## 2023-01-01 DIAGNOSIS — K760 Fatty (change of) liver, not elsewhere classified: Secondary | ICD-10-CM | POA: Diagnosis not present

## 2023-01-01 DIAGNOSIS — Z808 Family history of malignant neoplasm of other organs or systems: Secondary | ICD-10-CM | POA: Diagnosis not present

## 2023-01-01 DIAGNOSIS — Z9089 Acquired absence of other organs: Secondary | ICD-10-CM | POA: Diagnosis not present

## 2023-01-01 DIAGNOSIS — I10 Essential (primary) hypertension: Secondary | ICD-10-CM | POA: Diagnosis not present

## 2023-01-01 DIAGNOSIS — Z8042 Family history of malignant neoplasm of prostate: Secondary | ICD-10-CM | POA: Insufficient documentation

## 2023-01-01 DIAGNOSIS — M2550 Pain in unspecified joint: Secondary | ICD-10-CM | POA: Diagnosis not present

## 2023-01-01 DIAGNOSIS — G629 Polyneuropathy, unspecified: Secondary | ICD-10-CM | POA: Insufficient documentation

## 2023-01-01 DIAGNOSIS — Z87442 Personal history of urinary calculi: Secondary | ICD-10-CM | POA: Insufficient documentation

## 2023-01-01 DIAGNOSIS — Z8379 Family history of other diseases of the digestive system: Secondary | ICD-10-CM | POA: Insufficient documentation

## 2023-01-01 DIAGNOSIS — Z5111 Encounter for antineoplastic chemotherapy: Secondary | ICD-10-CM | POA: Diagnosis present

## 2023-01-01 DIAGNOSIS — D46Z Other myelodysplastic syndromes: Secondary | ICD-10-CM | POA: Diagnosis not present

## 2023-01-01 DIAGNOSIS — Z79899 Other long term (current) drug therapy: Secondary | ICD-10-CM | POA: Insufficient documentation

## 2023-01-01 DIAGNOSIS — Z823 Family history of stroke: Secondary | ICD-10-CM | POA: Insufficient documentation

## 2023-01-01 DIAGNOSIS — C9200 Acute myeloblastic leukemia, not having achieved remission: Secondary | ICD-10-CM | POA: Insufficient documentation

## 2023-01-01 DIAGNOSIS — Z87891 Personal history of nicotine dependence: Secondary | ICD-10-CM | POA: Diagnosis not present

## 2023-01-01 LAB — COMPREHENSIVE METABOLIC PANEL
ALT: 35 U/L (ref 0–44)
AST: 27 U/L (ref 15–41)
Albumin: 4 g/dL (ref 3.5–5.0)
Alkaline Phosphatase: 66 U/L (ref 38–126)
Anion gap: 10 (ref 5–15)
BUN: 22 mg/dL (ref 8–23)
CO2: 25 mmol/L (ref 22–32)
Calcium: 9.3 mg/dL (ref 8.9–10.3)
Chloride: 105 mmol/L (ref 98–111)
Creatinine, Ser: 1.22 mg/dL (ref 0.61–1.24)
GFR, Estimated: >60 mL/min (ref >60)
Glucose, Bld: 122 mg/dL — ABNORMAL HIGH (ref 70–99)
Potassium: 3.5 mmol/L (ref 3.5–5.1)
Sodium: 140 mmol/L (ref 135–145)
Total Bilirubin: 0.8 mg/dL (ref 0.3–1.2)
Total Protein: 6.6 g/dL (ref 6.5–8.1)

## 2023-01-01 LAB — CBC WITH DIFFERENTIAL/PLATELET
Abs Immature Granulocytes: 0.08 10*3/uL — ABNORMAL HIGH (ref 0.00–0.07)
Basophils Absolute: 0 10*3/uL (ref 0.0–0.1)
Basophils Relative: 1 %
Eosinophils Absolute: 0.1 10*3/uL (ref 0.0–0.5)
Eosinophils Relative: 2 %
HCT: 46.7 % (ref 39.0–52.0)
Hemoglobin: 15.5 g/dL (ref 13.0–17.0)
Immature Granulocytes: 1 %
Lymphocytes Relative: 18 %
Lymphs Abs: 1.1 10*3/uL (ref 0.7–4.0)
MCH: 31.6 pg (ref 26.0–34.0)
MCHC: 33.2 g/dL (ref 30.0–36.0)
MCV: 95.1 fL (ref 80.0–100.0)
Monocytes Absolute: 0.4 10*3/uL (ref 0.1–1.0)
Monocytes Relative: 6 %
Neutro Abs: 4.5 10*3/uL (ref 1.7–7.7)
Neutrophils Relative %: 72 %
Platelets: 155 10*3/uL (ref 150–400)
RBC: 4.91 MIL/uL (ref 4.22–5.81)
RDW: 15.4 % (ref 11.5–15.5)
WBC: 6.2 10*3/uL (ref 4.0–10.5)
nRBC: 0 % (ref 0.0–0.2)

## 2023-01-01 LAB — MAGNESIUM: Magnesium: 1.9 mg/dL (ref 1.7–2.4)

## 2023-01-01 MED ORDER — SODIUM CHLORIDE 0.9 % IV SOLN: Freq: Once | INTRAVENOUS | Status: AC

## 2023-01-01 MED ORDER — SODIUM CHLORIDE 0.9 % IV SOLN
15.0000 mg/m2 | Freq: Once | INTRAVENOUS | Status: AC
  Administered 2023-01-01: 35 mg via INTRAVENOUS
  Filled 2023-01-01: qty 7

## 2023-01-01 MED ORDER — ONDANSETRON HCL 4 MG/2ML IJ SOLN
4.0000 mg | Freq: Once | INTRAMUSCULAR | Status: AC
  Administered 2023-01-01: 4 mg via INTRAVENOUS
  Filled 2023-01-01: qty 2

## 2023-01-01 MED ORDER — SODIUM CHLORIDE 0.9% FLUSH
10.0000 mL | INTRAVENOUS | Status: DC | PRN
  Administered 2023-01-01: 10 mL

## 2023-01-01 MED ORDER — PREGABALIN 25 MG PO CAPS: 25.0000 mg | ORAL_CAPSULE | Freq: Two times a day (BID) | ORAL | 1 refills | Status: DC

## 2023-01-01 MED ORDER — HEPARIN SOD (PORK) LOCK FLUSH 100 UNIT/ML IV SOLN
500.0000 [IU] | Freq: Once | INTRAVENOUS | Status: AC | PRN
  Administered 2023-01-01: 500 [IU]

## 2023-01-01 NOTE — Patient Instructions (Addendum)
[facility] at [facility] Discharge Instructions   You were seen and examined today by Dr. Ellin Saba.  He reviewed the results of your lab work which are normal/stable.   We will proceed with your treatment today.   Stop taking Neurontin. We will send Lyrica 25 mg for you to take twice a day.   Hold the Venetoclax for this treatment.   Return as scheduled.    Thank you for choosing [facility] at [facility] to provide your oncology and hematology care.  To afford each patient quality time with our provider, please arrive at least 15 minutes before your scheduled appointment time.   If you have a lab appointment with the Cancer Center please come in thru the Main Entrance and check in at the main information desk.  You need to re-schedule your appointment should you arrive 10 or more minutes late.  We strive to give you quality time with our providers, and arriving late affects you and other patients whose appointments are after yours.  Also, if you no show three or more times for appointments you may be dismissed from the clinic at the providers discretion.     Again, thank you for choosing [facility].  Our hope is that these requests will decrease the amount of time that you wait before being seen by our physicians.       _____________________________________________________________  Should you have questions after your visit to [facility], please contact our office at [phone] and follow the prompts.  Our office hours are 8:00 a.m. and 4:30 p.m. Monday - Friday.  Please note that voicemails left after 4:00 p.m. may not be returned until the following business day.  We are closed weekends and major holidays.  You do have access to a nurse 24-7, just call the main number to the clinic [phone] and do not press any options, hold on the line and a nurse will answer the phone.    For prescription refill  requests, have your pharmacy contact our office and allow 72 hours.    Due to Covid, you will need to wear a mask upon entering the hospital. If you do not have a mask, a mask will be given to you at the Main Entrance upon arrival. For doctor visits, patients may have 1 support person age 78 or older with them. For treatment visits, patients can not have anyone with them due to social distancing guidelines and our immunocompromised population.

## 2023-01-01 NOTE — Patient Instructions (Signed)
[facility]  Discharge Instructions: Thank you for choosing [facility] to provide your oncology and hematology care.  If you have a lab appointment with the Cancer Center - please note that after April 8th, 2024, all labs will be drawn in the cancer center.  You do not have to check in or register with the main entrance as you have in the past but will complete your check-in in the cancer center.  Wear comfortable clothing and clothing appropriate for easy access to any Portacath or PICC line.   We strive to give you quality time with your provider. You may need to reschedule your appointment if you arrive late (15 or more minutes).  Arriving late affects you and other patients whose appointments are after yours.  Also, if you miss three or more appointments without notifying the office, you may be dismissed from the clinic at the provider's discretion.      For prescription refill requests, have your pharmacy contact our office and allow 72 hours for refills to be completed.    Today you received the following chemotherapy and/or immunotherapy agents Decitabine      To help prevent nausea and vomiting after your treatment, we encourage you to take your nausea medication as directed.  BELOW ARE SYMPTOMS THAT SHOULD BE REPORTED IMMEDIATELY: *FEVER GREATER THAN 100.4 F (38 C) OR HIGHER *CHILLS OR SWEATING *NAUSEA AND VOMITING THAT IS NOT CONTROLLED WITH YOUR NAUSEA MEDICATION *UNUSUAL SHORTNESS OF BREATH *UNUSUAL BRUISING OR BLEEDING *URINARY PROBLEMS (pain or burning when urinating, or frequent urination) *BOWEL PROBLEMS (unusual diarrhea, constipation, pain near the anus) TENDERNESS IN MOUTH AND THROAT WITH OR WITHOUT PRESENCE OF ULCERS (sore throat, sores in mouth, or a toothache) UNUSUAL RASH, SWELLING OR PAIN  UNUSUAL VAGINAL DISCHARGE OR ITCHING   Items with * indicate a potential emergency and should be followed up as soon as possible or go to the  Emergency Department if any problems should occur.  Please show the CHEMOTHERAPY ALERT CARD or IMMUNOTHERAPY ALERT CARD at check-in to the Emergency Department and triage nurse.  Should you have questions after your visit or need to cancel or reschedule your appointment, please contact [facility] [phone]  and follow the prompts.  Office hours are 8:00 a.m. to 4:30 p.m. Monday - Friday. Please note that voicemails left after 4:00 p.m. may not be returned until the following business day.  We are closed weekends and major holidays. You have access to a nurse at all times for urgent questions. Please call the main number to the clinic [phone] and follow the prompts.  For any non-urgent questions, you may also contact your provider using MyChart. We now offer e-Visits for anyone 18 and older to request care online for non-urgent symptoms. For details visit [email or web address].   Also download the MyChart app! Go to the app store, search "MyChart", open the app, select [facility], and log in with your MyChart username and password.   

## 2023-01-01 NOTE — Progress Notes (Signed)
Patient presents today for Decitabine infusion per providers order.  Vital signs and labs reviewed by MD.  Message received from Chapman Moss RN/Dr, Ellin Saba patient okay for treatment.  Treatment given today per MD orders.  Stable during infusion without adverse affects.  Vital signs stable.  No complaints at this time.  Discharge from clinic ambulatory in stable condition.  Alert and oriented X 3.  Follow up with [facility] as scheduled.

## 2023-01-02 ENCOUNTER — Inpatient Hospital Stay: Payer: Medicare Other

## 2023-01-02 VITALS — BP 136/81 | HR 76 | Temp 97.8°F | Resp 18

## 2023-01-02 DIAGNOSIS — D46Z Other myelodysplastic syndromes: Secondary | ICD-10-CM

## 2023-01-02 DIAGNOSIS — C9200 Acute myeloblastic leukemia, not having achieved remission: Secondary | ICD-10-CM

## 2023-01-02 DIAGNOSIS — Z95828 Presence of other vascular implants and grafts: Secondary | ICD-10-CM

## 2023-01-02 DIAGNOSIS — R4182 Altered mental status, unspecified: Secondary | ICD-10-CM | POA: Diagnosis not present

## 2023-01-02 MED ORDER — HEPARIN SOD (PORK) LOCK FLUSH 100 UNIT/ML IV SOLN
500.0000 [IU] | Freq: Once | INTRAVENOUS | Status: AC | PRN
  Administered 2023-01-02: 500 [IU]

## 2023-01-02 MED ORDER — ONDANSETRON HCL 4 MG/2ML IJ SOLN
4.0000 mg | Freq: Once | INTRAMUSCULAR | Status: AC
  Administered 2023-01-02: 4 mg via INTRAVENOUS
  Filled 2023-01-02: qty 2

## 2023-01-02 MED ORDER — SODIUM CHLORIDE 0.9 % IV SOLN: Freq: Once | INTRAVENOUS | Status: AC

## 2023-01-02 MED ORDER — SODIUM CHLORIDE 0.9% FLUSH
10.0000 mL | INTRAVENOUS | Status: DC | PRN
  Administered 2023-01-02: 10 mL

## 2023-01-02 MED ORDER — SODIUM CHLORIDE 0.9 % IV SOLN
15.0000 mg/m2 | Freq: Once | INTRAVENOUS | Status: AC
  Administered 2023-01-02: 35 mg via INTRAVENOUS
  Filled 2023-01-02: qty 7

## 2023-01-02 NOTE — Progress Notes (Signed)
Patient presents today for day 2, cycle 34 decitabine chemotherapy infusion.  Patient is in satisfactory condition, family does report patient has confusion but also states it was addressed yesterday during office visit with Dr Ellin Saba. Vital signs are stable.  Labs reviewed and all labs are within treatment parameters.  We will proceed with treatment per MD orders.    Patient tolerated treatment well with no complaints voiced.  Patient left via wheelchair in stable condition.  Vital signs stable at discharge.  Follow up as scheduled.

## 2023-01-02 NOTE — Patient Instructions (Signed)
[facility]  Discharge Instructions: Thank you for choosing [facility] to provide your oncology and hematology care.  If you have a lab appointment with the Cancer Center - please note that after April 8th, 2024, all labs will be drawn in the cancer center.  You do not have to check in or register with the main entrance as you have in the past but will complete your check-in in the cancer center.  Wear comfortable clothing and clothing appropriate for easy access to any Portacath or PICC line.   We strive to give you quality time with your provider. You may need to reschedule your appointment if you arrive late (15 or more minutes).  Arriving late affects you and other patients whose appointments are after yours.  Also, if you miss three or more appointments without notifying the office, you may be dismissed from the clinic at the provider's discretion.      For prescription refill requests, have your pharmacy contact our office and allow 72 hours for refills to be completed.    Today you received the following chemotherapy and/or immunotherapy agents Dectiabine.  Decitabine Injection What is this medication? DECITABINE (dee SYE ta been) treats blood and bone marrow cancers. It works by slowing down the growth of cancer cells. This medicine may be used for other purposes; ask your health care provider or pharmacist if you have questions. COMMON BRAND NAME(S): Dacogen What should I tell my care team before I take this medication? They need to know if you have any of these conditions: Infection Kidney disease Liver disease An unusual or allergic reaction to decitabine, other medications, foods, dyes, or preservatives Pregnant or trying to get pregnant Breast-feeding How should I use this medication? This medication is infused into a vein. It is given by your care team in a hospital or clinic setting. Talk to your care team about the use of this medication  in children. Special care may be needed. Overdosage: If you think you have taken too much of this medicine contact a poison control center or emergency room at once. NOTE: This medicine is only for you. Do not share this medicine with others. What if I miss a dose? Keep appointments for follow-up doses. It is important not to miss your dose. Call your care team if you are unable to keep an appointment. What may interact with this medication? Interactions are not expected. This list may not describe all possible interactions. Give your health care provider a list of all the medicines, herbs, non-prescription drugs, or dietary supplements you use. Also tell them if you smoke, drink alcohol, or use illegal drugs. Some items may interact with your medicine. What should I watch for while using this medication? Visit your care team for regular checks on your progress. You may need blood work while taking this medication. This medication may make you feel generally unwell. This is not uncommon as chemotherapy can affect healthy cells as well as cancer cells. Report any side effects. Continue your course of treatment even though you feel ill unless your care team tells you to stop. This medication may increase your risk of getting an infection. Call your care team for advice if you get a fever, chills, sore throat, or other symptoms of a cold or flu. Do not treat yourself. Try to avoid being around people who are sick. Be careful brushing or flossing your teeth or using a toothpick because you may get an infection or  bleed more easily. If you have any dental work done, tell your dentist you are receiving this medication. Call your care team if you are around anyone with measles, chickenpox, or if you develop sores or blisters that do not heal properly. Avoid taking medications that contain aspirin, acetaminophen, ibuprofen, naproxen, or ketoprofen unless instructed by your care team. These medications may  hide a fever. Talk to your care team if you or your partner wish to become pregnant or think either of you might be pregnant. This medication can cause serious birth defects if taken during pregnancy or for 6 months after the last dose. A negative pregnancy test is required before starting this medication. A reliable form of contraception is recommended while taking this medication and for 6 months after the last dose. Talk to your care team about reliable forms of contraception. Do not father a child while taking this medication or for 3 months after the last dose. Use a condom while having sex during this time period. Do not breast-feed while taking this medication and for 2 weeks after the last dose. This medication may cause infertility. Talk to your care team if you are concerned about your fertility. What side effects may I notice from receiving this medication? Side effects that you should report to your care team as soon as possible: Allergic reactions--skin rash, itching, hives, swelling of the face, lips, tongue, or throat Infection--fever, chills, cough, sore throat, wounds that don't heal, pain or trouble when passing urine, general feeling of discomfort or being unwell Low red blood cell level--unusual weakness or fatigue, dizziness, headache, trouble breathing Unusual bruising or bleeding Side effects that usually do not require medical attention (report to your care team if they continue or are bothersome): Constipation Diarrhea Fatigue Nausea Pain, redness, or swelling with sores inside the mouth or throat Stomach pain This list may not describe all possible side effects. Call your doctor for medical advice about side effects. You may report side effects to FDA at 1-800-FDA-1088. Where should I keep my medication? This medication is given in a hospital or clinic. It will not be stored at home. NOTE: This sheet is a summary. It may not cover all possible information. If you have  questions about this medicine, talk to your doctor, pharmacist, or health care provider.  2024 Elsevier/Gold Standard (2021-08-03 00:00:00)    To help prevent nausea and vomiting after your treatment, we encourage you to take your nausea medication as directed.  BELOW ARE SYMPTOMS THAT SHOULD BE REPORTED IMMEDIATELY: *FEVER GREATER THAN 100.4 F (38 C) OR HIGHER *CHILLS OR SWEATING *NAUSEA AND VOMITING THAT IS NOT CONTROLLED WITH YOUR NAUSEA MEDICATION *UNUSUAL SHORTNESS OF BREATH *UNUSUAL BRUISING OR BLEEDING *URINARY PROBLEMS (pain or burning when urinating, or frequent urination) *BOWEL PROBLEMS (unusual diarrhea, constipation, pain near the anus) TENDERNESS IN MOUTH AND THROAT WITH OR WITHOUT PRESENCE OF ULCERS (sore throat, sores in mouth, or a toothache) UNUSUAL RASH, SWELLING OR PAIN  UNUSUAL VAGINAL DISCHARGE OR ITCHING   Items with * indicate a potential emergency and should be followed up as soon as possible or go to the Emergency Department if any problems should occur.  Please show the CHEMOTHERAPY ALERT CARD or IMMUNOTHERAPY ALERT CARD at check-in to the Emergency Department and triage nurse.  Should you have questions after your visit or need to cancel or reschedule your appointment, please contact [facility] [phone]  and follow the prompts.  Office hours are 8:00 a.m. to 4:30 p.m.  Monday - Friday. Please note that voicemails left after 4:00 p.m. may not be returned until the following business day.  We are closed weekends and major holidays. You have access to a nurse at all times for urgent questions. Please call the main number to the clinic [phone] and follow the prompts.  For any non-urgent questions, you may also contact your provider using MyChart. We now offer e-Visits for anyone 68 and older to request care online for non-urgent symptoms. For details visit [email or web address].   Also download the MyChart app! Go to the app store,  search "MyChart", open the app, select , and log in with your MyChart username and password.

## 2023-01-03 ENCOUNTER — Inpatient Hospital Stay: Payer: Medicare Other

## 2023-01-03 VITALS — BP 131/83 | HR 71 | Temp 97.2°F | Resp 18

## 2023-01-03 DIAGNOSIS — D46Z Other myelodysplastic syndromes: Secondary | ICD-10-CM

## 2023-01-03 DIAGNOSIS — C9200 Acute myeloblastic leukemia, not having achieved remission: Secondary | ICD-10-CM

## 2023-01-03 DIAGNOSIS — Z95828 Presence of other vascular implants and grafts: Secondary | ICD-10-CM

## 2023-01-03 MED ORDER — SODIUM CHLORIDE 0.9% FLUSH
10.0000 mL | INTRAVENOUS | Status: DC | PRN
  Administered 2023-01-03: 10 mL

## 2023-01-03 MED ORDER — HEPARIN SOD (PORK) LOCK FLUSH 100 UNIT/ML IV SOLN
500.0000 [IU] | Freq: Once | INTRAVENOUS | Status: AC | PRN
  Administered 2023-01-03: 500 [IU]

## 2023-01-03 MED ORDER — ONDANSETRON HCL 4 MG/2ML IJ SOLN
4.0000 mg | Freq: Once | INTRAMUSCULAR | Status: AC
  Administered 2023-01-03: 4 mg via INTRAVENOUS
  Filled 2023-01-03: qty 2

## 2023-01-03 MED ORDER — SODIUM CHLORIDE 0.9 % IV SOLN: Freq: Once | INTRAVENOUS | Status: AC

## 2023-01-03 MED ORDER — SODIUM CHLORIDE 0.9 % IV SOLN
15.0000 mg/m2 | Freq: Once | INTRAVENOUS | Status: AC
  Administered 2023-01-03: 35 mg via INTRAVENOUS
  Filled 2023-01-03: qty 7

## 2023-01-03 NOTE — Progress Notes (Signed)
Patient presents today for chemotherapy infusion.  Patient is in satisfactory condition with no new complaints voiced.  Vital signs are stable.  Labs from 01/01/2023 reviewed by MD.  We will proceed with treatment per MD orders.    Patient tolerated treatment well with no complaints voiced.  Patient left via wheelchair in stable condition.  Vital signs stable at discharge.  Follow up as scheduled.

## 2023-01-03 NOTE — Patient Instructions (Signed)
[facility] [place]  Discharge Instructions: Thank you for choosing [facility] to provide your oncology and hematology care.  If you have a lab appointment with the Cancer Center - please note that after April 8th, 2024, all labs will be drawn in the cancer center.  You do not have to check in or register with the main entrance as you have in the past but will complete your check-in in the cancer center.  Wear comfortable clothing and clothing appropriate for easy access to any Portacath or PICC line.   We strive to give you quality time with your provider. You may need to reschedule your appointment if you arrive late (15 or more minutes).  Arriving late affects you and other patients whose appointments are after yours.  Also, if you miss three or more appointments without notifying the office, you may be dismissed from the clinic at the provider's discretion.      For prescription refill requests, have your pharmacy contact our office and allow 72 hours for refills to be completed.    Today you received the following chemotherapy and/or immunotherapy agents Decitabine.  Decitabine Injection What is this medication? DECITABINE (dee SYE ta been) treats blood and bone marrow cancers. It works by slowing down the growth of cancer cells. This medicine may be used for other purposes; ask your health care provider or pharmacist if you have questions. COMMON BRAND NAME(S): Dacogen What should I tell my care team before I take this medication? They need to know if you have any of these conditions: Infection Kidney disease Liver disease An unusual or allergic reaction to decitabine, other medications, foods, dyes, or preservatives Pregnant or trying to get pregnant Breast-feeding How should I use this medication? This medication is infused into a vein. It is given by your care team in a hospital or clinic setting. Talk to your care team about the use of this medication  in children. Special care may be needed. Overdosage: If you think you have taken too much of this medicine contact a poison control center or emergency room at once. NOTE: This medicine is only for you. Do not share this medicine with others. What if I miss a dose? Keep appointments for follow-up doses. It is important not to miss your dose. Call your care team if you are unable to keep an appointment. What may interact with this medication? Interactions are not expected. This list may not describe all possible interactions. Give your health care provider a list of all the medicines, herbs, non-prescription drugs, or dietary supplements you use. Also tell them if you smoke, drink alcohol, or use illegal drugs. Some items may interact with your medicine. What should I watch for while using this medication? Visit your care team for regular checks on your progress. You may need blood work while taking this medication. This medication may make you feel generally unwell. This is not uncommon as chemotherapy can affect healthy cells as well as cancer cells. Report any side effects. Continue your course of treatment even though you feel ill unless your care team tells you to stop. This medication may increase your risk of getting an infection. Call your care team for advice if you get a fever, chills, sore throat, or other symptoms of a cold or flu. Do not treat yourself. Try to avoid being around people who are sick. Be careful brushing or flossing your teeth or using a toothpick because you may get an infection or   bleed more easily. If you have any dental work done, tell your dentist you are receiving this medication. Call your care team if you are around anyone with measles, chickenpox, or if you develop sores or blisters that do not heal properly. Avoid taking medications that contain aspirin, acetaminophen, ibuprofen, naproxen, or ketoprofen unless instructed by your care team. These medications may  hide a fever. Talk to your care team if you or your partner wish to become pregnant or think either of you might be pregnant. This medication can cause serious birth defects if taken during pregnancy or for 6 months after the last dose. A negative pregnancy test is required before starting this medication. A reliable form of contraception is recommended while taking this medication and for 6 months after the last dose. Talk to your care team about reliable forms of contraception. Do not father a child while taking this medication or for 3 months after the last dose. Use a condom while having sex during this time period. Do not breast-feed while taking this medication and for 2 weeks after the last dose. This medication may cause infertility. Talk to your care team if you are concerned about your fertility. What side effects may I notice from receiving this medication? Side effects that you should report to your care team as soon as possible: Allergic reactions--skin rash, itching, hives, swelling of the face, lips, tongue, or throat Infection--fever, chills, cough, sore throat, wounds that don't heal, pain or trouble when passing urine, general feeling of discomfort or being unwell Low red blood cell level--unusual weakness or fatigue, dizziness, headache, trouble breathing Unusual bruising or bleeding Side effects that usually do not require medical attention (report to your care team if they continue or are bothersome): Constipation Diarrhea Fatigue Nausea Pain, redness, or swelling with sores inside the mouth or throat Stomach pain This list may not describe all possible side effects. Call your doctor for medical advice about side effects. You may report side effects to FDA at 1-800-FDA-1088. Where should I keep my medication? This medication is given in a hospital or clinic. It will not be stored at home. NOTE: This sheet is a summary. It may not cover all possible information. If you have  questions about this medicine, talk to your doctor, pharmacist, or health care provider.  2024 Elsevier/Gold Standard (2021-08-03 00:00:00)        To help prevent nausea and vomiting after your treatment, we encourage you to take your nausea medication as directed.  BELOW ARE SYMPTOMS THAT SHOULD BE REPORTED IMMEDIATELY: *FEVER GREATER THAN 100.4 F (38 C) OR HIGHER *CHILLS OR SWEATING *NAUSEA AND VOMITING THAT IS NOT CONTROLLED WITH YOUR NAUSEA MEDICATION *UNUSUAL SHORTNESS OF BREATH *UNUSUAL BRUISING OR BLEEDING *URINARY PROBLEMS (pain or burning when urinating, or frequent urination) *BOWEL PROBLEMS (unusual diarrhea, constipation, pain near the anus) TENDERNESS IN MOUTH AND THROAT WITH OR WITHOUT PRESENCE OF ULCERS (sore throat, sores in mouth, or a toothache) UNUSUAL RASH, SWELLING OR PAIN  UNUSUAL VAGINAL DISCHARGE OR ITCHING   Items with * indicate a potential emergency and should be followed up as soon as possible or go to the Emergency Department if any problems should occur.  Please show the CHEMOTHERAPY ALERT CARD or IMMUNOTHERAPY ALERT CARD at check-in to the Emergency Department and triage nurse.  Should you have questions after your visit or need to cancel or reschedule your appointment, please contact [facility] [place] [phone]  and follow the prompts.  Office hours are 8:00   a.m. to 4:30 p.m. Monday - Friday. Please note that voicemails left after 4:00 p.m. may not be returned until the following business day.  We are closed weekends and major holidays. You have access to a nurse at all times for urgent questions. Please call the main number to the clinic [phone] and follow the prompts.  For any non-urgent questions, you may also contact your provider using MyChart. We now offer e-Visits for anyone 18 and older to request care online for non-urgent symptoms. For details visit [email or web address].   Also download the MyChart app! Go to the app  store, search "MyChart", open the app, select [place], and log in with your MyChart username and password.   

## 2023-01-04 ENCOUNTER — Inpatient Hospital Stay: Payer: Medicare Other

## 2023-01-04 VITALS — BP 143/89 | HR 71 | Temp 97.6°F | Resp 18

## 2023-01-04 DIAGNOSIS — Z95828 Presence of other vascular implants and grafts: Secondary | ICD-10-CM

## 2023-01-04 DIAGNOSIS — D46Z Other myelodysplastic syndromes: Secondary | ICD-10-CM

## 2023-01-04 DIAGNOSIS — C9200 Acute myeloblastic leukemia, not having achieved remission: Secondary | ICD-10-CM

## 2023-01-04 MED ORDER — SODIUM CHLORIDE 0.9 % IV SOLN
15.0000 mg/m2 | Freq: Once | INTRAVENOUS | Status: AC
  Administered 2023-01-04: 35 mg via INTRAVENOUS
  Filled 2023-01-04: qty 7

## 2023-01-04 MED ORDER — ONDANSETRON HCL 4 MG/2ML IJ SOLN
4.0000 mg | Freq: Once | INTRAMUSCULAR | Status: AC
  Administered 2023-01-04: 4 mg via INTRAVENOUS
  Filled 2023-01-04: qty 2

## 2023-01-04 MED ORDER — HEPARIN SOD (PORK) LOCK FLUSH 100 UNIT/ML IV SOLN
500.0000 [IU] | Freq: Once | INTRAVENOUS | Status: AC | PRN
  Administered 2023-01-04: 500 [IU]

## 2023-01-04 MED ORDER — SODIUM CHLORIDE 0.9 % IV SOLN: Freq: Once | INTRAVENOUS | Status: AC

## 2023-01-04 MED ORDER — SODIUM CHLORIDE 0.9% FLUSH
10.0000 mL | INTRAVENOUS | Status: DC | PRN
  Administered 2023-01-04: 10 mL

## 2023-01-04 MED FILL — Decitabine For Inj 50 MG: INTRAVENOUS | Qty: 7 | Status: AC

## 2023-01-04 NOTE — Patient Instructions (Signed)
[facility] [place]  Discharge Instructions: Thank you for choosing [facility] to provide your oncology and hematology care.  If you have a lab appointment with the Cancer Center - please note that after April 8th, 2024, all labs will be drawn in the cancer center.  You do not have to check in or register with the main entrance as you have in the past but will complete your check-in in the cancer center.  Wear comfortable clothing and clothing appropriate for easy access to any Portacath or PICC line.   We strive to give you quality time with your provider. You may need to reschedule your appointment if you arrive late (15 or more minutes).  Arriving late affects you and other patients whose appointments are after yours.  Also, if you miss three or more appointments without notifying the office, you may be dismissed from the clinic at the provider's discretion.      For prescription refill requests, have your pharmacy contact our office and allow 72 hours for refills to be completed.    Today you received the following chemotherapy and/or immunotherapy agents decitabine   To help prevent nausea and vomiting after your treatment, we encourage you to take your nausea medication as directed.  BELOW ARE SYMPTOMS THAT SHOULD BE REPORTED IMMEDIATELY: *FEVER GREATER THAN 100.4 F (38 C) OR HIGHER *CHILLS OR SWEATING *NAUSEA AND VOMITING THAT IS NOT CONTROLLED WITH YOUR NAUSEA MEDICATION *UNUSUAL SHORTNESS OF BREATH *UNUSUAL BRUISING OR BLEEDING *URINARY PROBLEMS (pain or burning when urinating, or frequent urination) *BOWEL PROBLEMS (unusual diarrhea, constipation, pain near the anus) TENDERNESS IN MOUTH AND THROAT WITH OR WITHOUT PRESENCE OF ULCERS (sore throat, sores in mouth, or a toothache) UNUSUAL RASH, SWELLING OR PAIN  UNUSUAL VAGINAL DISCHARGE OR ITCHING   Items with * indicate a potential emergency and should be followed up as soon as possible or go to the  Emergency Department if any problems should occur.  Please show the CHEMOTHERAPY ALERT CARD or IMMUNOTHERAPY ALERT CARD at check-in to the Emergency Department and triage nurse.  Should you have questions after your visit or need to cancel or reschedule your appointment, please contact [facility] AT  [phone]  and follow the prompts.  Office hours are 8:00 a.m. to 4:30 p.m. Monday - Friday. Please note that voicemails left after 4:00 p.m. may not be returned until the following business day.  We are closed weekends and major holidays. You have access to a nurse at all times for urgent questions. Please call the main number to the clinic [phone] and follow the prompts.  For any non-urgent questions, you may also contact your provider using MyChart. We now offer e-Visits for anyone 18 and older to request care online for non-urgent symptoms. For details visit [email or web address].   Also download the MyChart app! Go to the app store, search "MyChart", open the app, select [facility], and log in with your MyChart username and password.   

## 2023-01-04 NOTE — Progress Notes (Signed)
Treatment given per orders. Patient tolerated it well without problems. Vitals stable and discharged home from clinic ambulatory. Follow up as scheduled.  

## 2023-01-05 ENCOUNTER — Inpatient Hospital Stay (HOSPITAL_COMMUNITY)
Admission: EM | Admit: 2023-01-05 | Discharge: 2023-01-15 | DRG: 834 | Disposition: A | Discharge status: Home Health | Payer: Medicare Other | Attending: Family Medicine | Admitting: Family Medicine

## 2023-01-05 ENCOUNTER — Emergency Department (HOSPITAL_COMMUNITY): Payer: Medicare Other

## 2023-01-05 ENCOUNTER — Other Ambulatory Visit: Payer: Self-pay

## 2023-01-05 ENCOUNTER — Inpatient Hospital Stay: Payer: Medicare Other

## 2023-01-05 ENCOUNTER — Encounter (HOSPITAL_COMMUNITY): Payer: Self-pay | Admitting: Emergency Medicine

## 2023-01-05 ENCOUNTER — Inpatient Hospital Stay (HOSPITAL_COMMUNITY): Payer: Medicare Other

## 2023-01-05 DIAGNOSIS — C9200 Acute myeloblastic leukemia, not having achieved remission: Principal | ICD-10-CM | POA: Diagnosis present

## 2023-01-05 DIAGNOSIS — M7918 Myalgia, other site: Secondary | ICD-10-CM | POA: Diagnosis present

## 2023-01-05 DIAGNOSIS — R443 Hallucinations, unspecified: Secondary | ICD-10-CM | POA: Diagnosis present

## 2023-01-05 DIAGNOSIS — Z66 Do not resuscitate: Secondary | ICD-10-CM | POA: Diagnosis present

## 2023-01-05 DIAGNOSIS — N400 Enlarged prostate without lower urinary tract symptoms: Secondary | ICD-10-CM | POA: Diagnosis present

## 2023-01-05 DIAGNOSIS — R4182 Altered mental status, unspecified: Secondary | ICD-10-CM | POA: Diagnosis present

## 2023-01-05 DIAGNOSIS — R29818 Other symptoms and signs involving the nervous system: Secondary | ICD-10-CM | POA: Diagnosis present

## 2023-01-05 DIAGNOSIS — R310 Gross hematuria: Secondary | ICD-10-CM | POA: Diagnosis not present

## 2023-01-05 DIAGNOSIS — M170 Bilateral primary osteoarthritis of knee: Secondary | ICD-10-CM | POA: Diagnosis present

## 2023-01-05 DIAGNOSIS — Z823 Family history of stroke: Secondary | ICD-10-CM

## 2023-01-05 DIAGNOSIS — Z87891 Personal history of nicotine dependence: Secondary | ICD-10-CM

## 2023-01-05 DIAGNOSIS — G629 Polyneuropathy, unspecified: Secondary | ICD-10-CM | POA: Diagnosis present

## 2023-01-05 DIAGNOSIS — Z23 Encounter for immunization: Secondary | ICD-10-CM

## 2023-01-05 DIAGNOSIS — Z981 Arthrodesis status: Secondary | ICD-10-CM

## 2023-01-05 DIAGNOSIS — R471 Dysarthria and anarthria: Secondary | ICD-10-CM | POA: Diagnosis not present

## 2023-01-05 DIAGNOSIS — G8929 Other chronic pain: Secondary | ICD-10-CM | POA: Diagnosis present

## 2023-01-05 DIAGNOSIS — R4701 Aphasia: Secondary | ICD-10-CM | POA: Diagnosis present

## 2023-01-05 DIAGNOSIS — D849 Immunodeficiency, unspecified: Secondary | ICD-10-CM | POA: Diagnosis present

## 2023-01-05 DIAGNOSIS — E871 Hypo-osmolality and hyponatremia: Secondary | ICD-10-CM | POA: Diagnosis present

## 2023-01-05 DIAGNOSIS — E669 Obesity, unspecified: Secondary | ICD-10-CM | POA: Diagnosis present

## 2023-01-05 DIAGNOSIS — R9089 Other abnormal findings on diagnostic imaging of central nervous system: Secondary | ICD-10-CM

## 2023-01-05 DIAGNOSIS — Z79899 Other long term (current) drug therapy: Secondary | ICD-10-CM

## 2023-01-05 DIAGNOSIS — K219 Gastro-esophageal reflux disease without esophagitis: Secondary | ICD-10-CM | POA: Diagnosis present

## 2023-01-05 DIAGNOSIS — I10 Essential (primary) hypertension: Secondary | ICD-10-CM | POA: Diagnosis present

## 2023-01-05 DIAGNOSIS — F419 Anxiety disorder, unspecified: Secondary | ICD-10-CM | POA: Diagnosis present

## 2023-01-05 DIAGNOSIS — Z8042 Family history of malignant neoplasm of prostate: Secondary | ICD-10-CM

## 2023-01-05 DIAGNOSIS — Z85828 Personal history of other malignant neoplasm of skin: Secondary | ICD-10-CM

## 2023-01-05 DIAGNOSIS — K5900 Constipation, unspecified: Secondary | ICD-10-CM | POA: Diagnosis not present

## 2023-01-05 DIAGNOSIS — Z808 Family history of malignant neoplasm of other organs or systems: Secondary | ICD-10-CM

## 2023-01-05 DIAGNOSIS — M109 Gout, unspecified: Secondary | ICD-10-CM | POA: Diagnosis present

## 2023-01-05 DIAGNOSIS — G9341 Metabolic encephalopathy: Secondary | ICD-10-CM | POA: Diagnosis present

## 2023-01-05 DIAGNOSIS — J9811 Atelectasis: Secondary | ICD-10-CM | POA: Diagnosis present

## 2023-01-05 DIAGNOSIS — M419 Scoliosis, unspecified: Secondary | ICD-10-CM | POA: Diagnosis present

## 2023-01-05 DIAGNOSIS — Z993 Dependence on wheelchair: Secondary | ICD-10-CM

## 2023-01-05 DIAGNOSIS — Z85820 Personal history of malignant melanoma of skin: Secondary | ICD-10-CM

## 2023-01-05 DIAGNOSIS — Z87442 Personal history of urinary calculi: Secondary | ICD-10-CM

## 2023-01-05 DIAGNOSIS — M1990 Unspecified osteoarthritis, unspecified site: Secondary | ICD-10-CM | POA: Diagnosis present

## 2023-01-05 DIAGNOSIS — Z8261 Family history of arthritis: Secondary | ICD-10-CM

## 2023-01-05 DIAGNOSIS — Z6831 Body mass index (BMI) 31.0-31.9, adult: Secondary | ICD-10-CM

## 2023-01-05 LAB — COMPREHENSIVE METABOLIC PANEL
ALT: 29 U/L (ref 0–44)
AST: 23 U/L (ref 15–41)
Albumin: 3.9 g/dL (ref 3.5–5.0)
Alkaline Phosphatase: 66 U/L (ref 38–126)
Anion gap: 6 (ref 5–15)
BUN: 15 mg/dL (ref 8–23)
CO2: 26 mmol/L (ref 22–32)
Calcium: 9.6 mg/dL (ref 8.9–10.3)
Chloride: 107 mmol/L (ref 98–111)
Creatinine, Ser: 0.89 mg/dL (ref 0.61–1.24)
GFR, Estimated: >60 mL/min (ref >60)
Glucose, Bld: 113 mg/dL — ABNORMAL HIGH (ref 70–99)
Potassium: 3.9 mmol/L (ref 3.5–5.1)
Sodium: 139 mmol/L (ref 135–145)
Total Bilirubin: 0.7 mg/dL (ref 0.3–1.2)
Total Protein: 6.3 g/dL — ABNORMAL LOW (ref 6.5–8.1)

## 2023-01-05 LAB — DIFFERENTIAL
Abs Immature Granulocytes: 0.08 10*3/uL — ABNORMAL HIGH (ref 0.00–0.07)
Basophils Absolute: 0.1 10*3/uL (ref 0.0–0.1)
Basophils Relative: 1 %
Eosinophils Absolute: 0.2 10*3/uL (ref 0.0–0.5)
Eosinophils Relative: 3 %
Immature Granulocytes: 1 %
Lymphocytes Relative: 17 %
Lymphs Abs: 1 10*3/uL (ref 0.7–4.0)
Monocytes Absolute: 0.4 10*3/uL (ref 0.1–1.0)
Monocytes Relative: 6 %
Neutro Abs: 4 10*3/uL (ref 1.7–7.7)
Neutrophils Relative %: 72 %

## 2023-01-05 LAB — CBC
HCT: 47.2 % (ref 39.0–52.0)
Hemoglobin: 15.2 g/dL (ref 13.0–17.0)
MCH: 31.5 pg (ref 26.0–34.0)
MCHC: 32.2 g/dL (ref 30.0–36.0)
MCV: 97.9 fL (ref 80.0–100.0)
Platelets: 144 10*3/uL — ABNORMAL LOW (ref 150–400)
RBC: 4.82 MIL/uL (ref 4.22–5.81)
RDW: 14.9 % (ref 11.5–15.5)
WBC: 5.6 10*3/uL (ref 4.0–10.5)
nRBC: 0 % (ref 0.0–0.2)

## 2023-01-05 LAB — CRYPTOCOCCAL ANTIGEN: Crypto Ag: NEGATIVE

## 2023-01-05 LAB — I-STAT CHEM 8, ED
BUN: 18 mg/dL (ref 8–23)
Calcium, Ion: 1.23 mmol/L (ref 1.15–1.40)
Chloride: 106 mmol/L (ref 98–111)
Creatinine, Ser: 1 mg/dL (ref 0.61–1.24)
Glucose, Bld: 116 mg/dL — ABNORMAL HIGH (ref 70–99)
HCT: 46 % (ref 39.0–52.0)
Hemoglobin: 15.6 g/dL (ref 13.0–17.0)
Potassium: 5.1 mmol/L (ref 3.5–5.1)
Sodium: 141 mmol/L (ref 135–145)
TCO2: 27 mmol/L (ref 22–32)

## 2023-01-05 LAB — PROTIME-INR
INR: 1 (ref 0.8–1.2)
Prothrombin Time: 12.9 seconds (ref 11.4–15.2)

## 2023-01-05 LAB — APTT: aPTT: 25 seconds (ref 24–36)

## 2023-01-05 LAB — ETHANOL: Alcohol, Ethyl (B): <10 mg/dL (ref <10)

## 2023-01-05 LAB — CBG MONITORING, ED: Glucose-Capillary: 107 mg/dL — ABNORMAL HIGH (ref 70–99)

## 2023-01-05 MED ORDER — SODIUM CHLORIDE 0.9% FLUSH
3.0000 mL | Freq: Once | INTRAVENOUS | Status: AC
  Administered 2023-01-05: 3 mL via INTRAVENOUS

## 2023-01-05 MED ORDER — LORAZEPAM 1 MG PO TABS
1.0000 mg | ORAL_TABLET | Freq: Once | ORAL | Status: AC
  Administered 2023-01-05: 1 mg via ORAL
  Filled 2023-01-05: qty 1

## 2023-01-05 MED ORDER — TAMSULOSIN HCL 0.4 MG PO CAPS
0.8000 mg | ORAL_CAPSULE | Freq: Every day | ORAL | Status: DC
  Administered 2023-01-05 – 2023-01-15 (×10): 0.8 mg via ORAL
  Filled 2023-01-05 (×10): qty 2

## 2023-01-05 MED ORDER — OXYCODONE HCL 5 MG PO TABS
5.0000 mg | ORAL_TABLET | Freq: Four times a day (QID) | ORAL | Status: DC | PRN
  Administered 2023-01-06 – 2023-01-08 (×3): 5 mg via ORAL
  Filled 2023-01-05 (×4): qty 1

## 2023-01-05 MED ORDER — POLYETHYLENE GLYCOL 3350 17 G PO PACK
17.0000 g | PACK | Freq: Every day | ORAL | Status: DC | PRN
  Administered 2023-01-08 – 2023-01-10 (×2): 17 g via ORAL
  Filled 2023-01-05 (×2): qty 1

## 2023-01-05 MED ORDER — ACETAMINOPHEN 325 MG PO TABS
650.0000 mg | ORAL_TABLET | Freq: Four times a day (QID) | ORAL | Status: DC | PRN
  Administered 2023-01-08 – 2023-01-09 (×2): 650 mg via ORAL
  Filled 2023-01-05 (×2): qty 2

## 2023-01-05 MED ORDER — VANCOMYCIN HCL 2000 MG/400ML IV SOLN
2000.0000 mg | Freq: Once | INTRAVENOUS | Status: AC
  Administered 2023-01-05: 2000 mg via INTRAVENOUS
  Filled 2023-01-05: qty 400

## 2023-01-05 MED ORDER — ALLOPURINOL 100 MG PO TABS
300.0000 mg | ORAL_TABLET | Freq: Every day | ORAL | Status: DC
  Administered 2023-01-06 – 2023-01-15 (×10): 300 mg via ORAL
  Filled 2023-01-05 (×11): qty 3

## 2023-01-05 MED ORDER — MAGNESIUM OXIDE -MG SUPPLEMENT 400 (240 MG) MG PO TABS
400.0000 mg | ORAL_TABLET | Freq: Two times a day (BID) | ORAL | Status: DC
  Administered 2023-01-06 – 2023-01-15 (×19): 400 mg via ORAL
  Filled 2023-01-05 (×22): qty 1

## 2023-01-05 MED ORDER — SODIUM CHLORIDE 0.9 % IV SOLN
2.0000 g | Freq: Three times a day (TID) | INTRAVENOUS | Status: DC
  Administered 2023-01-06 – 2023-01-07 (×5): 2 g via INTRAVENOUS
  Filled 2023-01-05 (×6): qty 12.5

## 2023-01-05 MED ORDER — LACTATED RINGERS IV SOLN: INTRAVENOUS | Status: DC

## 2023-01-05 MED ORDER — LIDOCAINE HCL (PF) 1 % IJ SOLN
30.0000 mL | Freq: Once | INTRAMUSCULAR | Status: DC
  Filled 2023-01-05: qty 30

## 2023-01-05 MED ORDER — SODIUM CHLORIDE 0.9 % IV SOLN
2.0000 g | INTRAVENOUS | Status: DC
  Administered 2023-01-06 – 2023-01-07 (×8): 2 g via INTRAVENOUS
  Filled 2023-01-05 (×15): qty 2000

## 2023-01-05 MED ORDER — DEXTROSE 5 % IV SOLN
10.0000 mg/kg | Freq: Three times a day (TID) | INTRAVENOUS | Status: DC
  Administered 2023-01-05 – 2023-01-07 (×5): 825 mg via INTRAVENOUS
  Filled 2023-01-05 (×10): qty 16.5

## 2023-01-05 MED ORDER — SODIUM CHLORIDE 0.9 % IV SOLN
2.0000 g | Freq: Once | INTRAVENOUS | Status: AC
  Administered 2023-01-05: 2 g via INTRAVENOUS
  Filled 2023-01-05: qty 2000

## 2023-01-05 MED ORDER — GADOBUTROL 1 MMOL/ML IV SOLN
10.0000 mL | Freq: Once | INTRAVENOUS | Status: AC | PRN
  Administered 2023-01-05: 10 mL via INTRAVENOUS

## 2023-01-05 MED ORDER — VANCOMYCIN HCL 1250 MG/250ML IV SOLN
1250.0000 mg | Freq: Two times a day (BID) | INTRAVENOUS | Status: DC
  Administered 2023-01-06 – 2023-01-07 (×3): 1250 mg via INTRAVENOUS
  Filled 2023-01-05 (×3): qty 250

## 2023-01-05 MED ORDER — SODIUM CHLORIDE 0.9 % IV SOLN
2.0000 g | Freq: Once | INTRAVENOUS | Status: AC
  Administered 2023-01-05: 2 g via INTRAVENOUS
  Filled 2023-01-05: qty 12.5

## 2023-01-05 MED ORDER — ACETAMINOPHEN 650 MG RE SUPP: 650.0000 mg | Freq: Four times a day (QID) | RECTAL | Status: DC | PRN
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
## 2023-01-05 NOTE — ED Triage Notes (Signed)
Pt wife states pt has had confusion and slurred speech x 5 days. Pt currently receiving chemo for lukemia. Right arm weakness also. Oncologist took patient off the Neurontin by symptoms have not resolved.

## 2023-01-05 NOTE — Plan of Care (Signed)
Neurology plan of care  This is a 77 yo man with hx AML on chemo who presents to [facility] ED with one week of fluctuating mental status and episodes of dysarthria with word finding difficulty. Head CT showed some cortical areas of hyperintensity (vs sulcal arguing against SAH; personal interpretation in agreement with radiology report).   MRI brain wwo Multifocal sulcal enhancement including along the left greater than right frontoparietal convexity and bilateral posterior fossa. Areas of possible punctate acute cortical infarcts and potential trace subarachnoid hemorrhage versus chronic superficial siderosis. 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  Personal review of imaging consistent with above interpretation; favor leptomeningeal spread of malignancy vs CNS infection. Patient is afebrile but immunosuppressed and may not mount typical response to infection. I recommended LP however ED was unable to perform given hx L2-L5 fusion.  Recommendations: - Empiric CNS coverage with cefepime, vanc, acyclovir, ampicillin - Admit to [facility] hospitalist service; page neurology for consultation on arrival - IR LP w/ cell count in 2 tubes, glucose, protein, culture, meningitis PCR panel, flow cytometry, cytology  Will follow in consultation after patient's arrival to [facility], [place] [facility] [phone]  If 7pm- 7am, please page neurology on call as listed in AMION.

## 2023-01-05 NOTE — ED Notes (Signed)
Swallow screen passed

## 2023-01-05 NOTE — Progress Notes (Signed)
Pharmacy Antibiotic Note  Richard Wallace is a 77 y.o. male admitted on 01/05/2023 with confusion and slurred speech.  Pharmacy has been consulted for vancomycin, acyclovir, and cefepime dosing for empiric meningitis.  Plan: Vancomycin 2000mg  x1 then 1250mg  Q12hr, goal trough 15-20  Cefepime 2g q8 hours Acyclovir 10mg /kg (825mg ) q 8 hours + LR 125 ml/hr Ampicillin 2g q4 hours  Height: 5' 9.5" (176.5 cm) Weight: 98.9 kg (218 lb) IBW/kg (Calculated) : 71.85  Temp (24hrs), Avg:98.6 F (37 C), Min:98.2 F (36.8 C), Max:99.2 F (37.3 C)  Recent Labs  Lab 01/01/23 0943 01/05/23 1011 01/05/23 1050  WBC 6.2 5.6  --   CREATININE 1.22 0.89 1.00    Estimated Creatinine Clearance: 71.2 mL/min (by C-G formula based on SCr of 1 mg/dL).    No Known Allergies  Antimicrobials: vanc 8/30>> Cefe 8/30>> Acyclovir 8/30>>  Amp 8/30>>  Microbiology:  None  Thank you for allowing pharmacy to be a part of this patient's care.  Alphia Moh, PharmD, BCPS, BCCP Clinical Pharmacist  Please check AMION for all [facility] phone numbers After 10:00 PM, call Main Pharmacy [phone]

## 2023-01-05 NOTE — Progress Notes (Signed)
Pharmacy Antibiotic Note  Richard Wallace is a 77 y.o. male admitted on 01/05/2023 with confusion and slurred speech.  Pharmacy has been consulted for vancomycin, acyclovir, and cefepime dosing.  Patient currently afebrile, wbc normal.  Vancomycin 1500 mg IV Q 24 hrs. Goal AUC 400-550. Expected AUC: 535 SCr used: 1   Plan: Vancomycin 1500 IV every 24 hours.  Goal trough 15-20 mcg/mL. Cefepime 2g q8 hours Acyclovir 10mg /kg (825mg ) q 8 hours Ampicillin 2g q4 hours  Height: 5' 9.5" (176.5 cm) Weight: 98.9 kg (218 lb) IBW/kg (Calculated) : 71.85  Temp (24hrs), Avg:98.2 F (36.8 C), Min:98.2 F (36.8 C), Max:98.2 F (36.8 C)  Recent Labs  Lab 01/01/23 0943 01/05/23 1011 01/05/23 1050  WBC 6.2 5.6  --   CREATININE 1.22 0.89 1.00    Estimated Creatinine Clearance: 71.2 mL/min (by C-G formula based on SCr of 1 mg/dL).    No Known Allergies  Thank you for allowing pharmacy to be a part of this patient's care.  Sheppard Coil PharmD., BCPS Clinical Pharmacist 01/05/2023 4:12 PM

## 2023-01-05 NOTE — ED Notes (Signed)
Patient transported to CT 

## 2023-01-05 NOTE — ED Provider Notes (Signed)
[place] EMERGENCY DEPARTMENT AT [facility] Provider Note   CSN: [id number] Arrival date & time: 01/05/23  8756     History  Chief Complaint  Patient presents with   Altered Mental Status    Richard Wallace is a 77 y.o. male.  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  Last treatment was yesterday. Brought to the ER today by his wife.  They complain that for the past week he has had intermittent episodes of confusion and slurred speech.  His wife states they tend to wax and wane.  The confusion is very remittent and very variable depending on how long the last but she states he slurred speech will last about an hour and a half at a time.  He is complained of some bilateral arm tingling, wife states that he is complaining more on the right side but he is not having any numbness or tingling now, he has had some generalized weakness but no focal weakness, no blurry vision or loss of vision.  No nausea or vomiting.  He had addressed this with his oncologist yesterday.  They stopped his gabapentin switched to Lyrica to see if this would potentially help but does not seem to have changed anything.  Also recommended follow-up with neurology.  He has not had fevers or chills, no nausea or vomiting no other complaints.   Altered Mental Status      Home Medications Prior to Admission medications   Medication Sig Start Date End Date Taking? Authorizing Provider  ACETAMINOPHEN EXTRA STRENGTH 500 MG capsule  05/31/21   [provider]  allopurinol (ZYLOPRIM) 300 MG tablet TAKE 1 TABLET BY MOUTH AT BEDTIME 10/11/22   Doreatha Massed, MD  amLODipine (NORVASC) 5 MG tablet Take 1 tablet (5 mg total) by mouth daily. 07/31/22   Billie Lade, MD  azelastine (ASTELIN) 0.1 % nasal spray Place 1 spray into both nostrils 2 (two) times daily. Use in each nostril as directed 07/31/22   Billie Lade, MD  diclofenac Sodium (VOLTAREN) 1 % GEL  Apply three times daily to knees as needed for pain 12/31/20   Doreatha Massed, MD  furosemide (LASIX) 20 MG tablet TAKE 1 TABLET BY MOUTH ONCE DAILY AS NEEDED 11/13/22   Doreatha Massed, MD  gabapentin (NEURONTIN) 300 MG capsule Take 1 capsule (300 mg total) by mouth 3 (three) times daily as needed. 07/20/22   Levert Feinstein, MD  lidocaine-prilocaine (EMLA) cream APPLY CREAM TOPICALLY ONE HOUR PRIOR TO CHEMOTHERAPY APPOINTMENT 06/01/22   Doreatha Massed, MD  magnesium oxide (MAG-OX) 400 (240 Mg) MG tablet TAKE 1 TABLET BY MOUTH THREE TIMES DAILY 09/29/21   Doreatha Massed, MD  oxyCODONE (OXY IR/ROXICODONE) 5 MG immediate release tablet Take 1 tablet (5 mg total) by mouth every 6 (six) hours as needed. 11/20/22   Doreatha Massed, MD  pregabalin (LYRICA) 25 MG capsule Take 1 capsule (25 mg total) by mouth 2 (two) times daily. 01/01/23   Doreatha Massed, MD  SF 5000 PLUS 1.1 % CREA dental cream SMARTSIG:Sparingly By Mouth Every Night 10/11/20   [provider]  tamsulosin (FLOMAX) 0.4 MG CAPS capsule Take 2 capsules (0.8 mg total) by mouth daily. 10/31/22   Billie Lade, MD  venetoclax (VENCLEXTA) 100 MG tablet TAKE 2 TABLETS (200 MG) BY MOUTH DAILY 09/19/22 09/19/23  Doreatha Massed, MD      Allergies    Patient has no known allergies.    Review of  Systems   Review of Systems  Physical Exam Updated Vital Signs BP (!) 145/91   Pulse 86   Temp 98.2 F (36.8 C)   Resp 18   Ht 5' 9.5" (1.765 m)   Wt 98.9 kg   SpO2 99%   BMI 31.73 kg/m  Physical Exam Vitals and nursing note reviewed.  Constitutional:      General: He is not in acute distress.    Appearance: He is well-developed.  HENT:     Head: Normocephalic and atraumatic.     Mouth/Throat:     Mouth: Mucous membranes are moist.  Eyes:     Conjunctiva/sclera: Conjunctivae normal.  Cardiovascular:     Rate and Rhythm: Normal rate and regular rhythm.     Heart sounds: No murmur heard. Pulmonary:      Effort: Pulmonary effort is normal. No respiratory distress.     Breath sounds: Normal breath sounds.  Abdominal:     Palpations: Abdomen is soft.     Tenderness: There is no abdominal tenderness.  Musculoskeletal:        General: No swelling.     Cervical back: Neck supple.  Skin:    General: Skin is warm and dry.     Capillary Refill: Capillary refill takes less than 2 seconds.  Neurological:     General: No focal deficit present.     Mental Status: He is alert and oriented to person, place, and time.     GCS: GCS eye subscore is 4. GCS verbal subscore is 5. GCS motor subscore is 6.     Cranial Nerves: No cranial nerve deficit.     Sensory: No sensory deficit.     Motor: No weakness or pronator drift.     Coordination: Coordination normal. Finger-Nose-Finger Test normal.  Psychiatric:        Mood and Affect: Mood normal.     ED Results / Procedures / Treatments   Labs (all labs ordered are listed, but only abnormal results are displayed) Labs Reviewed  PROTIME-INR  APTT  CBC  DIFFERENTIAL  COMPREHENSIVE METABOLIC PANEL  ETHANOL  URINALYSIS, ROUTINE W REFLEX MICROSCOPIC  I-STAT CHEM 8, ED  CBG MONITORING, ED    EKG None  Radiology No results found.  Procedures Procedures    Medications Ordered in ED Medications  sodium chloride flush (NS) 0.9 % injection 3 mL (has no administration in time range)    ED Course/ Medical Decision Making/ A&P                                 Medical Decision Making This patient presents to the ED for concern of intermittent slurred speech, this involves an extensive number of treatment options, and is a complaint that carries with it a high risk of complications and morbidity.  The differential diagnosis includes stroke, ICH, brain malignancy, metabolic encephalopthy, other   Co morbidities that complicate the patient evaluation :  AML   Additional history obtained:  Additional history obtained from EMR External  records from outside source obtained and reviewed including labs, notes   Lab Tests:  I Ordered, and personally interpreted labs.  The pertinent results include: Platelets very slightly reduced at 144, normal white count, CMP reassuring, normal blood glucose, INR and PTT are normal   Imaging Studies ordered:  I ordered imaging studies including CT head which shows Scattered areas of hyperdensity along cerebral cortex bilaterally I independently  visualized and interpreted imaging within scope of identifying emergent findings  I agree with the radiologist interpretation   Cardiac Monitoring: / EKG:  The patient was maintained on a cardiac monitor.  I personally viewed and interpreted the cardiac monitored which showed an underlying rhythm of: Sinus rhythm   Consultations Obtained:  I requested consultation with the neurologist Dr. Selina Cooley,  and discussed lab and imaging findings as well as pertinent plan - they recommend: Form lumbar puncture, call her back when cell counts have returned, we will plan regardless on admission to [facility]   Problem List / ED Course / Critical interventions / Medication management  Intermittent slurred speech and occasional expressive aphasia for the past week, occasional confusion as well.  Patient has AML and is being treated by oncology here in [place].  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  Patient has no fevers or headaches or neck stiffness but is immune suppressed.  Plan was for a lumbar puncture.  Unfortunately however prior to the procedure was noted patient has scarring, notes that he had lumbar fusion, record review shows he had a fusion from L2-L5 in 2016.  I consulted with neurosurgery who states to be best performed by interventional radiology if at all.  I discussed  again with Dr. 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  Patient and his wife were informed of plan and are agreeable with this.  He has remained stable in the ER.  I have reviewed the patients home medicines and have made adjustments as needed    Amount and/or Complexity of Data Reviewed Labs: ordered. Radiology: ordered.  Risk Prescription drug management. Decision regarding hospitalization.           Final Clinical Impression(s) / ED Diagnoses Final diagnoses:  None    Rx / DC Orders ED Discharge Orders     None         Josem Kaufmann 01/05/23 1926    Rondel Baton, MD 01/09/23 [id number]

## 2023-01-05 NOTE — ED Notes (Signed)
Pt is very unsteady when trying to ambulate. 2+ assist

## 2023-01-06 ENCOUNTER — Inpatient Hospital Stay (HOSPITAL_COMMUNITY): Payer: Medicare Other

## 2023-01-06 DIAGNOSIS — R29818 Other symptoms and signs involving the nervous system: Secondary | ICD-10-CM | POA: Diagnosis not present

## 2023-01-06 DIAGNOSIS — G9341 Metabolic encephalopathy: Secondary | ICD-10-CM | POA: Diagnosis not present

## 2023-01-06 DIAGNOSIS — I10 Essential (primary) hypertension: Secondary | ICD-10-CM | POA: Diagnosis not present

## 2023-01-06 DIAGNOSIS — C9200 Acute myeloblastic leukemia, not having achieved remission: Secondary | ICD-10-CM | POA: Diagnosis not present

## 2023-01-06 LAB — MENINGITIS/ENCEPHALITIS PANEL (CSF)

## 2023-01-06 LAB — PROTEIN AND GLUCOSE, CSF
Glucose, CSF: 43 mg/dL (ref 40–70)
Total  Protein, CSF: 78 mg/dL — ABNORMAL HIGH (ref 15–45)

## 2023-01-06 LAB — CSF CELL COUNT WITH DIFFERENTIAL
Eosinophils, CSF: 0 % (ref 0–1)
Lymphs, CSF: 6 % — ABNORMAL LOW (ref 40–80)
Monocyte-Macrophage-Spinal Fluid: 12 % — ABNORMAL LOW (ref 15–45)
RBC Count, CSF: 188 /mm3 — ABNORMAL HIGH
Segmented Neutrophils-CSF: 81 % — ABNORMAL HIGH (ref 0–6)
Tube #: 3
WBC, CSF: 370 /mm3 (ref 0–5)

## 2023-01-06 MED ORDER — CHLORHEXIDINE GLUCONATE CLOTH 2 % EX PADS
6.0000 | MEDICATED_PAD | Freq: Every day | CUTANEOUS | Status: DC
  Administered 2023-01-06 – 2023-01-15 (×9): 6 via TOPICAL

## 2023-01-06 MED ORDER — MELATONIN 3 MG PO TABS
3.0000 mg | ORAL_TABLET | Freq: Every day | ORAL | Status: DC
  Administered 2023-01-06 – 2023-01-15 (×9): 3 mg via ORAL
  Filled 2023-01-06 (×10): qty 1

## 2023-01-06 MED ORDER — SODIUM CHLORIDE 0.9% FLUSH
10.0000 mL | Freq: Two times a day (BID) | INTRAVENOUS | Status: DC
  Administered 2023-01-06 – 2023-01-15 (×15): 10 mL

## 2023-01-06 MED ORDER — ONDANSETRON HCL 4 MG/2ML IJ SOLN
INTRAMUSCULAR | Status: AC
  Filled 2023-01-06: qty 2

## 2023-01-06 MED ORDER — LIDOCAINE 1 % OPTIME INJ - NO CHARGE
5.0000 mL | Freq: Once | INTRAMUSCULAR | Status: AC
  Administered 2023-01-06: 3 mL via INTRADERMAL

## 2023-01-06 MED ORDER — ONDANSETRON HCL 4 MG/2ML IJ SOLN
4.0000 mg | Freq: Four times a day (QID) | INTRAMUSCULAR | Status: DC | PRN
  Administered 2023-01-06 – 2023-01-11 (×3): 4 mg via INTRAVENOUS
  Filled 2023-01-06 (×3): qty 2

## 2023-01-06 MED ORDER — HALOPERIDOL LACTATE 5 MG/ML IJ SOLN
2.0000 mg | Freq: Four times a day (QID) | INTRAMUSCULAR | Status: DC | PRN
  Administered 2023-01-11 – 2023-01-12 (×2): 2 mg via INTRAVENOUS
  Filled 2023-01-06 (×2): qty 1

## 2023-01-06 NOTE — Plan of Care (Signed)
Problem: Education: Goal: Knowledge of General Education information will improve Description: Including pain rating scale, medication(s)/side effects and non-pharmacologic comfort measures 01/06/2023 1006 by Becky Augusta, RN Outcome: Adequate for Discharge 01/06/2023 1001 by Becky Augusta, RN Outcome: Adequate for Discharge   Problem: Health Behavior/Discharge Planning: Goal: Ability to manage health-related needs will improve 01/06/2023 1006 by Becky Augusta, RN Outcome: Adequate for Discharge 01/06/2023 1001 by Becky Augusta, RN Outcome: Adequate for Discharge   Problem: Clinical Measurements: Goal: Ability to maintain clinical measurements within normal limits will improve 01/06/2023 1006 by Becky Augusta, RN Outcome: Adequate for Discharge 01/06/2023 1001 by Becky Augusta, RN Outcome: Adequate for Discharge Goal: Will remain free from infection 01/06/2023 1006 by Becky Augusta, RN Outcome: Adequate for Discharge 01/06/2023 1001 by Becky Augusta, RN Outcome: Adequate for Discharge Goal: Diagnostic test results will improve 01/06/2023 1006 by Becky Augusta, RN Outcome: Adequate for Discharge 01/06/2023 1001 by Becky Augusta, RN Outcome: Adequate for Discharge Goal: Respiratory complications will improve 01/06/2023 1006 by Becky Augusta, RN Outcome: Adequate for Discharge 01/06/2023 1001 by Becky Augusta, RN Outcome: Adequate for Discharge Goal: Cardiovascular complication will be avoided 01/06/2023 1006 by Becky Augusta, RN Outcome: Adequate for Discharge 01/06/2023 1001 by Becky Augusta, RN Outcome: Adequate for Discharge   Problem: Activity: Goal: Risk for activity intolerance will decrease 01/06/2023 1006 by Becky Augusta, RN Outcome: Adequate for Discharge 01/06/2023 1001 by Becky Augusta, RN Outcome: Adequate for Discharge   Problem: Nutrition: Goal: Adequate nutrition will be maintained 01/06/2023 1006 by Becky Augusta, RN Outcome: Adequate for Discharge 01/06/2023 1001  by Becky Augusta, RN Outcome: Adequate for Discharge   Problem: Coping: Goal: Level of anxiety will decrease 01/06/2023 1006 by Becky Augusta, RN Outcome: Adequate for Discharge 01/06/2023 1001 by Becky Augusta, RN Outcome: Adequate for Discharge   Problem: Elimination: Goal: Will not experience complications related to bowel motility 01/06/2023 1006 by Becky Augusta, RN Outcome: Adequate for Discharge 01/06/2023 1001 by Becky Augusta, RN Outcome: Adequate for Discharge Goal: Will not experience complications related to urinary retention 01/06/2023 1006 by Becky Augusta, RN Outcome: Adequate for Discharge 01/06/2023 1001 by Becky Augusta, RN Outcome: Adequate for Discharge   Problem: Pain Managment: Goal: General experience of comfort will improve 01/06/2023 1006 by Becky Augusta, RN Outcome: Adequate for Discharge 01/06/2023 1001 by Becky Augusta, RN Outcome: Adequate for Discharge   Problem: Safety: Goal: Ability to remain free from injury will improve 01/06/2023 1006 by Becky Augusta, RN Outcome: Adequate for Discharge 01/06/2023 1001 by Becky Augusta, RN Outcome: Adequate for Discharge   Problem: Skin Integrity: Goal: Risk for impaired skin integrity will decrease 01/06/2023 1006 by Becky Augusta, RN Outcome: Adequate for Discharge 01/06/2023 1001 by Becky Augusta, RN Outcome: Adequate for Discharge   Problem: Education: Goal: Knowledge of General Education information will improve Description: Including pain rating scale, medication(s)/side effects and non-pharmacologic comfort measures Outcome: Adequate for Discharge   Problem: Health Behavior/Discharge Planning: Goal: Ability to manage health-related needs will improve Outcome: Adequate for Discharge   Problem: Clinical Measurements: Goal: Ability to maintain clinical measurements within normal limits will improve Outcome: Adequate for Discharge Goal: Will remain free from infection Outcome: Adequate for  Discharge Goal: Diagnostic test results will improve Outcome: Adequate for Discharge Goal: Respiratory complications will improve Outcome: Adequate for Discharge Goal: Cardiovascular complication will be avoided Outcome: Adequate for Discharge   Problem: Activity: Goal: Risk for activity intolerance will decrease Outcome: Adequate for Discharge   Problem: Nutrition: Goal: Adequate nutrition will be maintained Outcome: Adequate for Discharge  Problem: Coping: Goal: Level of anxiety will decrease Outcome: Adequate for Discharge   Problem: Elimination: Goal: Will not experience complications related to bowel motility Outcome: Adequate for Discharge Goal: Will not experience complications related to urinary retention Outcome: Adequate for Discharge   Problem: Pain Managment: Goal: General experience of comfort will improve Outcome: Adequate for Discharge   Problem: Safety: Goal: Ability to remain free from injury will improve Outcome: Adequate for Discharge   Problem: Skin Integrity: Goal: Risk for impaired skin integrity will decrease Outcome: Adequate for Discharge

## 2023-01-06 NOTE — Assessment & Plan Note (Signed)
Resume amlodipine. 

## 2023-01-06 NOTE — Progress Notes (Signed)
  Progress Note   Patient: Richard Wallace VOZ:[id number] DOB: [birth year] DOA: 01/05/2023     1 DOS: the patient was seen and examined on 01/06/2023 at 9:02 AM      Brief hospital course: 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  In the ER, MRI brain showed leptomeningeal enhancement, so he was started on empiric antibiotics and admitted for LP     Assessment and Plan: * Acute metabolic encephalopathy DDx includes CNS progression of leukemia versus infection.  EEG showed no seizure activity despite ongoing encephalopathy.  Serum crypto antigen negative. - Continue ampicillin, cefepime and vancomycin - Continue acyclovir  - Consult Neurology - Follow CSF culture  - Follow CSF cytology - Follow CSF HSV PCR    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HCC)    Essential hypertension BP near normal - Hold amlodipine          Subjective: Patient remains somewhat confused.  He has some nausea this afternoon.  No particular headache, respiratory symptoms.  No loss of consciousness.  No nursing concerns.  No seizures.     Physical Exam: BP (!) 140/92 (BP Location: Left Arm)   Pulse 86   Temp 97.9 F (36.6 C) (Oral)   Resp 19   Ht 5' 9.5" (1.765 m)   Wt 98.9 kg   SpO2 94%   BMI 31.73 kg/m   Elderly adult male, lying in bed, appears weak and tired RRR, no murmurs, no peripheral edema Respiratory normal, lungs clear without rales or wheezes Psychomotor slowing is noted, appears generally weak and tired, mild tremor    Data Reviewed: Case was discussed with neurology Basic metabolic panel and CBC normal Serum crypto antigen negative CSF protein elevated CSF white blood cell count 300   Family Communication: Wife at the bedside    Disposition: Status is: Inpatient         Author: Alberteen Sam, MD 01/06/2023 3:58 PM  For on call review [email or web address].

## 2023-01-06 NOTE — Plan of Care (Signed)
  Problem: Education: Goal: Knowledge of General Education information will improve Description Including pain rating scale, medication(s)/side effects and non-pharmacologic comfort measures Outcome: Progressing   

## 2023-01-06 NOTE — Hospital Course (Signed)
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  In the ER, MRI brain showed leptomeningeal enhancement, so he was started on empiric antibiotics and admitted for LP

## 2023-01-06 NOTE — Progress Notes (Signed)
11:25AM: patient transported to radiology for lumbar puncture with transport tech.

## 2023-01-06 NOTE — Progress Notes (Signed)
Successful Lumbar Puncture from L2-L3 No immediate complications. See PACS report for full details   Brayton El PA-C Interventional Radiology 01/06/2023 12:17 PM

## 2023-01-06 NOTE — Plan of Care (Signed)

## 2023-01-06 NOTE — Consult Note (Signed)
Neurology Consultation Reason for Consult: Confusion Referring Physician: Elgergawy, D  CC: Confusion  History is obtained from: Patient, wife  HPI: Richard Wallace is a 77 y.o. male with a history of leukemia currently undergoing treatment with altered mental status.  His wife states that she first noticed some trouble about 2 weeks ago, and he has been getting gradually worse since that time.  He waxes and wanes, but has been gradually clearly worsening.  Due to the symptoms, he sought care in the emergency department at [facility] where an MRI was obtained and demonstrates findings concerning for leptomeningeal spread versus infection.  Unfortunate, he has a history of multilevel lumbar fusion and therefore we are waiting to have this done under fluoroscopy.   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   Cancer (HCC) 01-28-2014   skin cancer   CKD (chronic kidney disease), stage III (HCC)    GERD (gastroesophageal reflux disease)    Heart murmur    NOTED DURING PHYSICAL WHEN HE WAS ENLISTING IN [facility] , DIDNT KNOW UNTIL THAT TIME AND REPORTS , "THATS THE LAST I HEARD ABOUT IT "    History of hypertension    no longer issue   History of kidney stones    History of malignant melanoma of skin    excision top of scalp 2015-- no recurrence   History of urinary retention    post op lumbar fusion surgery 04/ 2016   Hypertension    Kidney dysfunction    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 His smoking use included cigarettes. He started smoking about 56 years ago. He has never used smokeless tobacco. He reports current alcohol use of about 7.0 - 14.0 standard drinks of alcohol per week. He reports that he does not use drugs.   Exam: Current vital signs: BP (!) 151/93 (BP Location: Left Arm)   Pulse 93   Temp 99.2 F (37.3 C) (Oral)   Resp 16   Ht 5' 9.5" (1.765 m)   Wt 98.9 kg   SpO2 98%   BMI 31.73 kg/m  Vital signs in last 24 hours: Temp:  [98.2 F (36.8 C)-99.2 F (37.3 C)] 99.2 F (37.3 C) (08/30 2058) Pulse Rate:  [86-93] 93 (08/30 2058) Resp:  [16-18] 16 (08/30 9147) BP: (145-161)/(91-118) 151/93 (08/30 2058) SpO2:  [93 %-99 %] 98 % (08/30 2058) Weight:  [98.9 kg] 98.9 kg (08/30 0943)   Physical Exam  Appears well-developed and well-nourished.   Neuro: Mental Status: Patient is awake, alert, oriented to person, when asked location, he states "cancer center" but does not know that we are in [place], unable to give month or year. He has halting speech with difficulty with naming, mild difficulty with repetition. Cranial Nerves: II: Visual Fields are full.  Pupils are equal, round, and reactive to light.   III,IV, VI: EOMI without ptosis or diploplia.  V: Facial sensation is symmetric to temperature VII: Facial movement is symmetric.  VIII: hearing is intact to voice X: Uvula elevates symmetrically XI: Shoulder shrug is symmetric. XII: tongue is midline without atrophy or fasciculations.  Motor: Tone is normal. Bulk is normal. 5/5 strength was present in all four extremities.  He has significant postural and resting tremor. Sensory: Sensation is diminished from the mid calf bilaterally Cerebellar: Does not perform     I have reviewed labs in epic and the results pertinent to this consultation are: CMP is unremarkable Crypto antigen is negative  I have reviewed the images obtained: MRI  brain-extra-axial diffusion change  Impression: 77 year old male with history of leukemia undergoing treatment who presents with progressive confusion which is waxing and waning.  He has been started on antimicrobial therapy, and though I suspect that infection is unlikely, would favor continuing until we get CSF sampling.  My suspicion is this likely represents CNS spread of his leukemia.  Recommendations: 1) EEG 2) LP under fluoroscopy for cells, protein, glucose, culture, fungal culture, cytology 3) could consider restarting gabapentin as he got benefit from this for his neuropathic pain in the past.   Ritta Slot, MD [facility] [phone]  If 7pm- 7am, please page neurology on call as listed in AMION.

## 2023-01-06 NOTE — Progress Notes (Signed)
EEG complete - results pending 

## 2023-01-06 NOTE — Assessment & Plan Note (Signed)
Initial differential included leukemic invasion of CNS, infection.  Admitted and underwent LP.    Cell count shows pleocytosis, but on microscopy, these are atpypical WBCs, suggesting malignancy.  ME PCR panel completely negative.  Antibiotics stopped.  Discussed with oncology, Dr. Ellin Saba.  If this preliminary result is correct, he is not a candidate for further chemo, and hospice would be recommended.  Hematopathology will re-review slides on Tuesday and complete flow cytometry on available CSF samples.  These results may be available to Dr. Ellin Saba later in the week.  In awaiting these results, family and patient wish to attempt rehabilitation at SNF.   [facility] consult for SNF placement - Recommend Palliative to follow at SNF - Follow up with Dr. Ellin Saba after further pathology analysis  - If testing confirms CNS leukemia, he would recommend hospice - If testing inconclusive, he may recommend repeat LP

## 2023-01-06 NOTE — Progress Notes (Signed)
OT Cancellation Note  Patient Details Name: Richard Wallace MRN: [id number] DOB: [birth year]   Cancelled Treatment:    Reason Eval/Treat Not Completed: Other (comment). Pt preparing to leave unit for lumbar puncture. OT will follow up next available time as appropriate  Galen Manila 01/06/2023, 11:12 AM

## 2023-01-06 NOTE — Progress Notes (Signed)
PT Cancellation Note  Patient Details Name: Richard Wallace MRN: [id number] DOB: 05-01-1946   Cancelled Treatment:    Reason Eval/Treat Not Completed: Other (comment)  Just spoke with OT who met with family. They were assisting pt in bed and report he is going for LP soon. Requested we hold evaluations until after LP. Will make an attempt to visit this afternoon, as schedule allows. Otherwise may have to perform functional evaluation tomorrow. Will follow-up.    Kathlyn Sacramento, PT, DPT [facility]  Rehabilitation Services Physical Therapist Office: [phone] Website: [email or web address]   Berton Mount 01/06/2023, 11:11 AM

## 2023-01-07 DIAGNOSIS — I10 Essential (primary) hypertension: Secondary | ICD-10-CM | POA: Diagnosis not present

## 2023-01-07 DIAGNOSIS — R4182 Altered mental status, unspecified: Secondary | ICD-10-CM

## 2023-01-07 DIAGNOSIS — G9341 Metabolic encephalopathy: Secondary | ICD-10-CM | POA: Diagnosis not present

## 2023-01-07 DIAGNOSIS — C9200 Acute myeloblastic leukemia, not having achieved remission: Secondary | ICD-10-CM | POA: Diagnosis not present

## 2023-01-07 LAB — RHEUMATOID FACTOR: Rheumatoid fact SerPl-aCnc: <10 [IU]/mL (ref <14.0)

## 2023-01-07 MED ORDER — ENSURE ENLIVE PO LIQD
237.0000 mL | Freq: Two times a day (BID) | ORAL | Status: DC
  Administered 2023-01-09 – 2023-01-15 (×10): 237 mL via ORAL

## 2023-01-07 MED ORDER — DIPHENHYDRAMINE-ZINC ACETATE 2-0.1 % EX CREA
TOPICAL_CREAM | Freq: Three times a day (TID) | CUTANEOUS | Status: DC | PRN
  Filled 2023-01-07: qty 28

## 2023-01-07 MED ORDER — AMLODIPINE BESYLATE 5 MG PO TABS
5.0000 mg | ORAL_TABLET | Freq: Every day | ORAL | Status: DC
  Administered 2023-01-08 – 2023-01-15 (×8): 5 mg via ORAL
  Filled 2023-01-07 (×8): qty 1

## 2023-01-07 MED ORDER — ADULT MULTIVITAMIN W/MINERALS CH
1.0000 | ORAL_TABLET | Freq: Every day | ORAL | Status: DC
  Administered 2023-01-07 – 2023-01-15 (×9): 1 via ORAL
  Filled 2023-01-07 (×9): qty 1

## 2023-01-07 NOTE — TOC Initial Note (Signed)
Transition of Care [facility]) - Initial/Assessment Note    Patient Details  Name: Richard Wallace MRN: [id number] Date of Birth: [birth year]  Transition of [facility]) CM/SW Contact:    Lawerance Sabal, RN Phone Number: 01/07/2023, 3:12 PM  Clinical Narrative:                  Spoke w patient and wife at bedside to discuss needs for DC. Discussed options, HH, wife declined stating that she cannot care for him at home, citing that he is max assist, and per her opinion has has a decline in the past two weeks. She would like him to be considered for SNF, naming [address] and [facility] as top choices. Updated CSW.     Expected Discharge Plan: Skilled Nursing Facility Barriers to Discharge: SNF Pending bed offer   Patient Goals and CMS Choice Patient states their goals for this hospitalization and ongoing recovery are:: SNF CMS Medicare.gov Compare Post Acute Care list provided to:: Other (Comment Required) Choice offered to / list presented to : Spouse      Expected Discharge Plan and Services   Discharge Planning Services: CM Consult Post Acute Care Choice: Skilled Nursing Facility Living arrangements for the past 2 months: Single Family Home                                      Prior Living Arrangements/Services Living arrangements for the past 2 months: Single Family Home Lives with:: Spouse              Current home services: DME    Activities of Daily Living Home Assistive Devices/Equipment: None ADL Screening (condition at time of admission) Patient's cognitive ability adequate to safely complete daily activities?: No Is the patient deaf or have difficulty hearing?: No Does the patient have difficulty seeing, even when wearing glasses/contacts?: No Does the patient have difficulty concentrating, remembering, or making decisions?: Yes Patient able to express need for assistance with ADLs?: No Does the patient have difficulty dressing or bathing?:  Yes Independently performs ADLs?: No Communication: Needs assistance Is this a change from baseline?: Change from baseline, expected to last >3 days Dressing (OT): Needs assistance Is this a change from baseline?: Change from baseline, expected to last >3 days Grooming: Needs assistance Is this a change from baseline?: Change from baseline, expected to last >3 days Feeding: Needs assistance Is this a change from baseline?: Change from baseline, expected to last >3 days Bathing: Needs assistance Is this a change from baseline?: Change from baseline, expected to last >3 days In/Out Bed: Needs assistance Is this a change from baseline?: Change from baseline, expected to last >3 days Walks in Home: Needs assistance Is this a change from baseline?: Change from baseline, expected to last >3 days Does the patient have difficulty walking or climbing stairs?: Yes Weakness of Legs: Both Weakness of Arms/Hands: Both  Permission Sought/Granted                  Emotional Assessment              Admission diagnosis:  Neurological deficit present [R29.818] Dysarthria [R47.1] MRI of brain abnormal [R90.89] Patient Active Problem List   Diagnosis Date Noted   Acute metabolic encephalopathy 01/05/2023   Confusion 08/30/2022   Seasonal allergies 08/06/2022   Chronic musculoskeletal pain 08/06/2022   History of nephrolithiasis 08/06/2022   Anxiety 08/06/2022  Neuropathy 08/06/2022   GERD (gastroesophageal reflux disease) 08/06/2022   BPH (benign prostatic hyperplasia) 08/06/2022   Daytime somnolence 08/06/2022   Mild cognitive impairment 07/20/2022   Pain in toes of both feet 07/20/2022   Paresthesia 07/20/2022   Weakness    Bacteremia due to Streptococcus 02/13/2019   Febrile illness    Leukopenia due to antineoplastic chemotherapy (HCC)    Thrombocytopenia (HCC)    Neutropenia with fever (HCC) 02/09/2019   Fever and neutropenia (HCC) 02/09/2019   Rectal bleeding 02/03/2019    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 [place] - [address] [place] [address] Phone: [phone] Fax: [phone]  [place] [facility] [address] [place] [address] Phone: [phone] Fax: [phone]     Social Determinants of Health (SDOH) Social History: SDOH Screenings   Food Insecurity: No Food Insecurity (01/05/2023)  Housing: Low Risk  (01/05/2023)  Transportation Needs: Unmet Transportation Needs (01/05/2023)  Utilities: Not At Risk (01/05/2023)  Alcohol Screen: Low Risk  (10/30/2022)  Depression (PHQ2-9): Low Risk  (10/31/2022)  Financial Resource Strain: Low Risk  (10/30/2022)  Physical Activity: Unknown (10/30/2022)  Social Connections: Moderately Isolated (10/30/2022)  Stress: No Stress Concern Present (10/30/2022)  Tobacco Use: Medium Risk (01/05/2023)   SDOH Interventions:     Readmission Risk Interventions     No data to display

## 2023-01-07 NOTE — Plan of Care (Signed)
  Problem: Education: Goal: Knowledge of General Education information will improve Description Including pain rating scale, medication(s)/side effects and non-pharmacologic comfort measures Outcome: Progressing   

## 2023-01-07 NOTE — Evaluation (Addendum)
Occupational Therapy Evaluation Patient Details Name: Richard Wallace MRN: [id number] DOB: Jun 24, 1945 Today's Date: 01/07/2023   History of Present Illness 77 y.o. male Presents to ED 01/05/23 secondary to complaints intermittent episodes of confusion, slurred speech, dysarthria, expressive aphasia x 1 week. 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   Clinical Impression   PTA, pt lived with wife who assisted with IADL until this past month in which pt has needed significantly more help with ADL and transfers to power chair which they report he was previously independent. Upon eval, pt requiring +2 Mod A for STS and steps toward HOB. Pt requiring max A for LB ADL and mod A for UB ADL; min-mod A for self feeding and grooming. Presenting with decreased coordination, balance, tremor, strength, communication, and safety. Pt wife present and supportive. Recommending short term inpatient rehabilitation <3 hours/day to optimize safety and independence while decreasing caregiver burden as limited available assist other than wife.     If plan is discharge home, recommend the following: A lot of help with walking and/or transfers;Two people to help with walking and/or transfers;A lot of help with bathing/dressing/bathroom;Two people to help with bathing/dressing/bathroom;Assistance with cooking/housework;Assistance with feeding;Assist for transportation;Help with stairs or ramp for entrance    Functional Status Assessment  Patient has had a recent decline in their functional status and demonstrates the ability to make significant improvements in function in a  reasonable and predictable amount of time.  Equipment Recommendations  None recommended by OT    Recommendations for Other Services       Precautions / Restrictions Precautions Precautions: Fall Restrictions Weight Bearing Restrictions: No      Mobility Bed Mobility Overal bed mobility: Needs Assistance Bed Mobility: Supine to Sit, Sit to Supine     Supine to sit: Contact guard Sit to supine: Contact guard assist   General bed mobility comments: HOB flat and no use of rail; incr effort; repeated x 2 as once returned to bed pt wanted to stand up to use urinal    Transfers Overall transfer level: Needs assistance Equipment used: 2 person hand held assist Transfers: Sit to/from Stand Sit to Stand: Mod assist, +2 physical assistance           General transfer comment: initially with bil LE tremors with incr assist and pt bracing back of legs against bed to come to stand; second attempt +2 min assist and no tremors, but still bracing legs against bed; transfer OOB deferred as wife sleeping in chair and reports very little sleep x 3 days      Balance Overall balance assessment: Needs assistance Sitting-balance support: No upper extremity supported, Feet supported Sitting balance-Leahy Scale: Fair Sitting balance - Comments: loses balance with LE testing   Standing balance support: Bilateral upper extremity supported Standing balance-Leahy Scale: Poor Standing balance comment: initial posterior lean with knees hyperextended and pushing against bedframe                           ADL either performed or assessed with clinical judgement   ADL Overall ADL's : Needs assistance/impaired Eating/Feeding: Minimal assistance;Sitting   Grooming: Minimal assistance;Sitting   Upper Body Bathing: Sitting;Moderate assistance   Lower Body  Bathing: Maximal assistance;Sit to/from stand   Upper Body Dressing : Moderate assistance;Sitting   Lower Body Dressing: Maximal  assistance;Sit to/from stand   Toilet Transfer: Moderate assistance;+2 for safety/equipment;+2 for physical assistance;Stand-pivot   Toileting- Clothing Manipulation and Hygiene: Moderate assistance;+2 for physical assistance;+2 for safety/equipment Toileting - Clothing Manipulation Details (indicate cue type and reason): to hold urinal and maintain balance standing EOB. SIgnificant BUE tremor requriing OT to stablize urinal with pt holding     Functional mobility during ADLs: Moderate assistance;+2 for physical assistance;+2 for safety/equipment       Vision Ability to See in Adequate Light: 0 Adequate Patient Visual Report: No change from baseline Additional Comments: Pt able to recognize location of OT feet worried about her stepping on them, no known difficulties seeing. [facility]net (within a year) cataract surgery.     Perception         Praxis         Pertinent Vitals/Pain Pain Assessment Pain Assessment: Faces Faces Pain Scale: No hurt     Extremity/Trunk Assessment Upper Extremity Assessment Upper Extremity Assessment: Generalized weakness;Right hand dominant;RUE deficits/detail;LUE deficits/detail RUE Deficits / Details: RUE with decreased strength and coordination as compared to L; greater differences distally than proximally. grip/push 3+/5; shoulder flexion 5/5; pull 4/5. BUE numbness/neuropathy from chemo LUE Deficits / Details: decresaed coordination and slight intention tremor. BUE numbness/neuropathy from chemo   Lower Extremity Assessment Lower Extremity Assessment: Defer to PT evaluation RLE Deficits / Details: very limited ankle DF due to h/o fracture with "plates and screws" per wife; initial sit to stand with bil LE tremors; grossly 4/5 RLE Sensation: decreased light touch;history of peripheral neuropathy LLE Deficits / Details: tremor during sit to stand; grossly 4/5 LLE Sensation: decreased light touch;history of peripheral neuropathy   Cervical / Trunk  Assessment Cervical / Trunk Assessment: Normal   Communication Communication Communication: Difficulty communicating thoughts/reduced clarity of speech Cueing Techniques: Verbal cues;Gestural cues;Tactile cues   Cognition Arousal: Lethargic Behavior During Therapy: WFL for tasks assessed/performed Overall Cognitive Status: Difficult to assess                                 General Comments: comprehension appears intact as pt following commands (occasionally required repetition); often repeats what he has just heard     General Comments  Wife present and assisting with history, PLOF, and home set-up    Exercises     Shoulder Instructions      Home Living Family/patient expects to be discharged to:: Private residence Living Arrangements: Spouse/significant other Available Help at Discharge: Family;Available 24 hours/day Type of Home: House Home Access: Stairs to enter [employer] of Steps: 1 Entrance Stairs-Rails: None Home Layout: Two level;Laundry or work area in basement;Able to live on main level with bedroom/bathroom Alternate Teacher, music of Steps: 12   Bathroom Shower/Tub: Chief Strategy Officer: Handicapped height     Home Equipment: Tub bench;Wheelchair - Surveyor, quantity (2 wheels);Cane - single point;Grab bars - tub/shower          Prior Functioning/Environment Prior Level of Function : Needs assist             Mobility Comments: until recently was transferring to/from w/c independently without a device; recently min assist by wife; does not ambulate even short distances; to exit home: drives power w/c to entrance, steps down to another power chair in carport (reverses process to get into home) ADLs  Comments: independent until a month ago, now dependent; recently unable to get into tub/shower        OT Problem List: Decreased strength;Decreased activity tolerance;Impaired balance (sitting and/or  standing);Decreased coordination;Decreased cognition;Decreased safety awareness;Decreased knowledge of use of DME or AE;Impaired sensation;Impaired tone;Impaired UE functional use      OT Treatment/Interventions: Self-care/ADL training;Therapeutic exercise;DME and/or AE instruction;Balance training;Patient/family education;Cognitive remediation/compensation;Therapeutic activities    OT Goals(Current goals can be found in the care plan section) Acute Rehab OT Goals Patient Stated Goal: Move better OT Goal Formulation: With patient Time For Goal Achievement: 01/21/23 Potential to Achieve Goals: Fair  OT Frequency: Min 2X/week    Co-evaluation PT/OT/SLP Co-Evaluation/Treatment: Yes Reason for Co-Treatment: Complexity of the patient's impairments (multi-system involvement);For patient/therapist safety;To address functional/ADL transfers PT goals addressed during session: Mobility/safety with mobility;Balance OT goals addressed during session: ADL's and self-care      AM-PAC OT "6 Clicks" Daily Activity     Outcome Measure Help from another person eating meals?: A Little Help from another person taking care of personal grooming?: A Little Help from another person toileting, which includes using toliet, bedpan, or urinal?: A Lot Help from another person bathing (including washing, rinsing, drying)?: A Lot Help from another person to put on and taking off regular upper body clothing?: A Lot Help from another person to put on and taking off regular lower body clothing?: A Lot 6 Click Score: 14   End of Session Equipment Utilized During Treatment: Gait belt Nurse Communication: Mobility status  Activity Tolerance: Patient tolerated treatment well Patient left: in bed;with call bell/phone within reach  OT Visit Diagnosis: Unsteadiness on feet (R26.81);Muscle weakness (generalized) (M62.81);Other symptoms and signs involving cognitive function;Other abnormalities of gait and mobility  (R26.89);Cognitive communication deficit (R41.841)                Time: 2956-2130 OT Time Calculation (min): 31 min Charges:  OT General Charges $OT Visit: 1 Visit OT Evaluation $OT Eval Moderate Complexity: 1 Mod  Tyler Deis, OTR/L [facility] Acute Rehabilitation Office: [phone]   Myrla Halsted 01/07/2023, 12:14 PM

## 2023-01-07 NOTE — Procedures (Signed)
Routine EEG Report  Richard Wallace is a 77 y.o. male with a history of altered mental status who is undergoing an EEG to evaluate for seizures.  Report: This EEG was acquired with electrodes placed according to the International 10-20 electrode system (including Fp1, Fp2, F3, F4, C3, C4, P3, P4, O1, O2, T3, T4, T5, T6, A1, A2, Fz, Cz, Pz). The following electrodes were missing or displaced: none.  The occipital dominant rhythm was 7 Hz. This activity is reactive to stimulation. Drowsiness was manifested by background fragmentation; deeper stages of sleep were identified by K complexes and sleep spindles. There was no focal slowing. There were no interictal epileptiform discharges. There were no electrographic seizures identified. Photic stimulation and hyperventilation were not performed.   Impression and clinical correlation: This EEG was obtained while awake and asleep and is abnormal due to mild diffuse slowing indicative of global cerebral dysfunction. Epileptiform abnormalities were not seen during this recording. Patient had numerous episodes of full body jerks while falling asleep that were associated with EMG artifact only and were consistent with (normal) sleep/physiologic myoclonus.  Bing Neighbors, MD [facility] [phone]  If 7pm- 7am, please page neurology on call as listed in AMION.

## 2023-01-07 NOTE — Evaluation (Addendum)
Physical Therapy Evaluation Patient Details Name: Richard Wallace MRN: [id number] DOB: [birth year] Today's Date: 01/07/2023  History of Present Illness  78 y.o. male Presents to ED 01/05/23 secondary to complaints intermittent episodes of confusion, slurred speech, dysarthria, expressive aphasia x 1 week. 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  Clinical Impression   Pt admitted secondary to problem above with deficits below. PTA patient has experienced a decline in function over ~4 weeks. He had been independent with transfers to/from his power chair, however recently requiring wife's assistance.  Pt currently requires +2 mod assist to come to stand due to bil LE tremors and posterior lean. Discussed need for additional therapy prior to return home and wife prefers to have HHPT. Patient may need medical transport home if not able to step up the single step at their entrance (which he has been able to do). Anticipate patient will benefit from PT to address problems listed below. Will continue to follow acutely to maximize functional mobility independence and safety.    Addendum (15:03)-Notified by [facility] that wife has decided she would prefer short term rehab <3 hrs/day prior to return home.          If plan is discharge home, recommend the following: A little help with walking and/or transfers;Assistance with cooking/housework;Direct supervision/assist for medications management;Direct supervision/assist for financial management;Assist for transportation;Help with stairs or ramp for entrance;Supervision due to cognitive status   Can travel by private  vehicle        Equipment Recommendations None recommended by PT  Recommendations for Other Services       Functional Status Assessment Patient has had a recent decline in their functional status and demonstrates the ability to make significant improvements in function in a reasonable and predictable amount of time.     Precautions / Restrictions Precautions Precautions: Fall      Mobility  Bed Mobility Overal bed mobility: Needs Assistance Bed Mobility: Supine to Sit, Sit to Supine     Supine to sit: Contact guard Sit to supine: Contact guard assist   General bed mobility comments: HOB flat and no use of rail; incr effort; repeated x 2 as once returned to bed pt wanted to stand up to use urinal    Transfers Overall transfer level: Needs assistance Equipment used: 2 person hand held assist Transfers: Sit to/from Stand Sit to Stand: Mod assist, +2 physical assistance           General transfer comment: initially with bil LE tremors with incr assist and pt bracing back of legs against bed to come to stand; second attempt +2 min assist and no tremors, but still bracing legs against bed; transfer OOB deferred as wife sleeping in chair and reports very little sleep x 3 days    Ambulation/Gait               General Gait Details: NA, pt uses power wheelchair  Stairs            Wheelchair Mobility     Tilt Bed    Modified Rankin (Stroke Patients Only)       Balance Overall balance assessment: Needs assistance Sitting-balance support: No upper extremity supported, Feet supported Sitting balance-Leahy Scale: Fair Sitting balance - Comments:  loses balance with LE testing   Standing balance support: Bilateral upper extremity supported Standing balance-Leahy Scale: Poor Standing balance comment: initial posterior lean with knees hyperextended and pushing against bedframe                             Pertinent Vitals/Pain Pain  Assessment Pain Assessment: Faces Faces Pain Scale: No hurt    Home Living Family/patient expects to be discharged to:: Private residence Living Arrangements: Spouse/significant other Available Help at Discharge: Family;Available 24 hours/day Type of Home: House Home Access: Stairs to enter Entrance Stairs-Rails: None Entrance Stairs-Number of Steps: 1 Alternate Level Stairs-Number of Steps: 12 Home Layout: Two level;Laundry or work area in basement;Able to live on main level with bedroom/bathroom Home Equipment: Tub bench;Wheelchair - Surveyor, quantity (2 wheels);Cane - single point;Grab bars - tub/shower      Prior Function Prior Level of Function : Needs assist             Mobility Comments: until recently was transferring to/from w/c independently without a device; recently min assist by wife; does not ambulate even short distances; to exit home: drives power w/c to entrance, steps down to another power chair in carport (reverses process to get into home) ADLs Comments: independent until a month ago, now dependent; recently unable to get into tub/shower     Extremity/Trunk Assessment   Upper Extremity Assessment Upper Extremity Assessment: Defer to OT evaluation    Lower Extremity Assessment Lower Extremity Assessment: RLE deficits/detail;LLE deficits/detail RLE Deficits / Details: very limited ankle DF due to h/o fracture with "plates and screws" per wife; initial sit to stand with bil LE tremors; grossly 4/5 RLE Sensation: decreased light touch;history of peripheral neuropathy LLE Deficits / Details: tremor during sit to stand; grossly 4/5 LLE Sensation: decreased light touch;history of peripheral neuropathy    Cervical / Trunk Assessment Cervical / Trunk Assessment: Normal  Communication   Communication Communication: Difficulty communicating thoughts/reduced clarity of speech Cueing Techniques: Verbal cues;Gestural cues;Tactile cues  Cognition Arousal:  Lethargic Behavior During Therapy: WFL for tasks assessed/performed Overall Cognitive Status: Difficult to assess                                 General Comments: comprehension appears intact as pt following commands (occasionally required repetition); often repeats what he has just heard        General Comments General comments (skin integrity, edema, etc.): Wife present and assisting with history, home setup and prior functional status.    Exercises     Assessment/Plan    PT Assessment Patient needs continued PT services  PT Problem List Decreased strength;Decreased balance;Decreased mobility;Decreased knowledge of use of DME;Decreased knowledge of precautions;Impaired sensation       PT Treatment Interventions DME instruction;Functional mobility training;Therapeutic activities;Balance training;Neuromuscular re-education;Patient/family education    PT Goals (Current goals can be found in the Care Plan section)  Acute Rehab PT Goals Patient Stated Goal: wife wants pt to be transferring well enough to go home PT Goal Formulation: With patient/family Time For Goal Achievement: 01/21/23 Potential to Achieve Goals: Good    Frequency Min 1X/week     Co-evaluation PT/OT/SLP Co-Evaluation/Treatment: Yes Reason for Co-Treatment: Complexity of the patient's impairments (multi-system involvement);For patient/therapist safety;To address functional/ADL transfers PT goals addressed during session: Mobility/safety with mobility;Balance         AM-PAC PT "6 Clicks" Mobility  Outcome Measure Help needed turning from your back to your side while in a flat bed without using bedrails?: None Help needed moving from lying on your back to sitting on the side of a flat bed without using bedrails?: A Little Help needed moving to and from a bed to a chair (including a wheelchair)?: Total Help needed standing up from a chair using your arms (e.g., wheelchair or bedside chair)?:  Total Help needed to walk in hospital room?: Total Help needed climbing 3-5 steps with a railing? : Total 6 Click Score: 11    End of Session Equipment Utilized During Treatment: Gait belt Activity Tolerance: Patient tolerated treatment well Patient left: in bed;with call bell/phone within reach;with bed alarm set;with family/visitor present   PT Visit Diagnosis: Unsteadiness on feet (R26.81);Muscle weakness (generalized) (M62.81)    Time: 3710-6269 PT Time Calculation (min) (ACUTE ONLY): 29 min   Charges:   PT Evaluation $PT Eval Moderate Complexity: 1 Mod   PT General Charges $$ ACUTE PT VISIT: 1 Visit          Jerolyn Center, PT Acute Rehabilitation Services  Office [phone]   Zena Amos 01/07/2023, 9:12 AM

## 2023-01-07 NOTE — TOC Progression Note (Addendum)
PT/OT updated recommendation from [facility] to SNF. Per CM, pt's wife agreeable to SNF and is requesting [facility] or [facility] in [place]. Will begin SNF search and f/u with offers as available.   In the event pt needs SNF placement in [place], will need to contact [facility] to obtain PASRR.   Dellie Burns, MSW, LCSW [phone] (coverage)

## 2023-01-07 NOTE — Progress Notes (Signed)
  Progress Note   Patient: Richard Wallace ZOX:[id number] DOB: Dec 02, 1945 DOA: 01/05/2023     2 DOS: the patient was seen and examined on 01/07/2023 at 9:30AM      Brief hospital course: 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  In the ER, MRI brain showed leptomeningeal enhancement, so he was started on empiric antibiotics and admitted for LP     Assessment and Plan: * Acute metabolic encephalopathy Admitted and underwent LP.  Cell count shows excessive WBCs, but on microscopy, these are atpypical WBCs, suggesting malignancy.  ME PCR panel completely negative.  I discussed extensively with Pathology and Neurology, and there is strong consensus that this is not infection.  - Await flow cytometry - Await repeat cytology eval by Hematopathology on Tuesday - Stop antibiotics and acyclovir    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HCC)    Essential hypertension - Resume amlodipine          Subjective: patient with hallucinations, still disoriented at times.  No focal weakness, no fever, no seizures, no nursing concerns.     Physical Exam: BP (!) 152/91 (BP Location: Right Arm)   Pulse 77   Temp 99.1 F (37.3 C) (Oral)   Resp 16   Ht 5' 9.5" (1.765 m)   Wt 98.9 kg   SpO2 95%   BMI 31.73 kg/m   Elderly adult male, lying in bed, appears weak and tired RRR, no murmurs, no peripheral edema Respiratory normal, lungs clear without rales or wheezes Psychomotor slowing is noted, appears generally weak and tired, mild tremor  Data Reviewed: Case discussed with Dr. Ellin Saba Oncology, Dr. Kenard Gower Pathology and Dr. Derry Lory Neurology Microbiology studies reviewed   Family Communication: Wife at the bedside    Disposition: Status is: Inpatient The patient was admitted with falls.  This may be from his cancer.  Tests are pending.  In the meantime, the patient requires PT 5-6 days per week to  attempt to return to his prior level of function before returning home.        Author: Alberteen Sam, MD 01/07/2023 5:18 PM  For on call review [email or web address].

## 2023-01-07 NOTE — Progress Notes (Signed)
Initial Nutrition Assessment  DOCUMENTATION CODES:   Obesity unspecified  INTERVENTION:  - Add Ensure Enlive po BID, each supplement provides 350 kcal and 20 grams of protein.  - Add MVI q day.   - Start 48 hr Calorie count.   NUTRITION DIAGNOSIS:   Inadequate oral intake related to lethargy/confusion as evidenced by meal completion < 50%.  GOAL:   Patient will meet greater than or equal to 90% of their needs  MONITOR:   PO intake, Supplement acceptance  REASON FOR ASSESSMENT:   Consult Calorie Count  ASSESSMENT:   77 y.o. male admits related to AMS. PMH includes: arthritis, CKD, GERD,HTN. Pt is currently receiving medical management related to acute metabolic encephalopathy.  Meds reviewed:  mag-ox. Labs reviewed: WDL.   Pt is currently Oriented x2. MD consult for calorie count. RD unable to reach pt via phone. Spoke with RN who reports that she placed a calorie count folder on his door yesterday evening. Per record, pt ate 50-75% of his meals today, however, other intakes noted to be 0-5%. No significant wt loss per record. RD will add supplements and continue to closely monitor calorie count. Pt is planning to discharge to SNF.   NUTRITION - FOCUSED PHYSICAL EXAM:  Remote assessment.  Diet Order:   Diet Order             Diet Heart Room service appropriate? Yes; Fluid consistency: Thin  Diet effective now                   EDUCATION NEEDS:   Not appropriate for education at this time  Skin:  Skin Assessment: Reviewed RN Assessment  Last BM:  8/29  Height:   Ht Readings from Last 1 Encounters:  01/05/23 5' 9.5" (1.765 m)    Weight:   Wt Readings from Last 1 Encounters:  01/05/23 98.9 kg    Ideal Body Weight:     BMI:  Body mass index is 31.73 kg/m.  Estimated Nutritional Needs:   Kcal:  2000-2400 kcals  Protein:  100-120 gm  Fluid:  >/= 2 L  Bethann Humble, RD, LDN, CNSC.

## 2023-01-07 NOTE — NC FL2 (Signed)
[place] MEDICAID FL2 LEVEL OF CARE FORM     IDENTIFICATION  Patient Name: Richard Wallace Birthdate: 26-Feb-1946 Sex: male Admission Date (Current Location): 01/05/2023  [facility] and [place] Number:  Producer, television/film/video and Address:  [facility], [address] [place], [place] [address]      Provider Number: [id number]  Attending Physician Name and Address:  Alberteen Sam, *  Relative Name and Phone Number:       Current Level of Care: Hospital Recommended Level of Care: Skilled Nursing Facility Prior Approval Number:    Date Approved/Denied:   PASRR Number:    Discharge Plan: SNF    Current Diagnoses: Patient Active Problem List   Diagnosis Date Noted   Acute metabolic encephalopathy 01/05/2023   Confusion 08/30/2022   Seasonal allergies 08/06/2022   Chronic musculoskeletal pain 08/06/2022   History of nephrolithiasis 08/06/2022   Anxiety 08/06/2022   Neuropathy 08/06/2022   GERD (gastroesophageal reflux disease) 08/06/2022   BPH (benign prostatic hyperplasia) 08/06/2022   Daytime somnolence 08/06/2022   Mild cognitive impairment 07/20/2022   Pain in toes of both feet 07/20/2022   Paresthesia 07/20/2022   Weakness    Bacteremia due to Streptococcus 02/13/2019   Febrile illness    Leukopenia due to antineoplastic chemotherapy (HCC)    Thrombocytopenia (HCC)    Neutropenia with fever (HCC) 02/09/2019   Fever and neutropenia (HCC) 02/09/2019   Rectal bleeding 02/03/2019   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HCC) 01/07/2019   Port-A-Cath in place 08/19/2018   Pancytopenia (HCC)    MDS (myelodysplastic syndrome), 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   Encounter for general adult medical examination with abnormal findings 08/14/2018   Other pancytopenia (HCC) 07/25/2018   Atelectasis    Hypoxemia    Essential hypertension    Lumbar scoliosis 08/21/2014    Orientation RESPIRATION BLADDER Height & Weight     Self, Place  Normal  Continent Weight: 218 lb (98.9 kg) Height:  5' 9.5" (176.5 cm)  BEHAVIORAL SYMPTOMS/MOOD NEUROLOGICAL BOWEL NUTRITION STATUS      Continent    AMBULATORY STATUS COMMUNICATION OF NEEDS Skin   Total Care Verbally Normal                       Personal Care Assistance Level of Assistance  Bathing, Feeding, Dressing Bathing Assistance: Maximum assistance Feeding assistance: Limited assistance Dressing Assistance: Limited assistance     Functional Limitations Info  Sight, Hearing, Speech Sight Info: Adequate Hearing Info: Adequate Speech Info: Adequate    SPECIAL CARE FACTORS FREQUENCY  PT (By licensed PT), OT (By licensed OT)                    Contractures Contractures Info: Not present    Additional Factors Info  Code Status Code Status Info: DNR             Current Medications (01/07/2023):  This is the current hospital active medication list Current Facility-Administered Medications  Medication Dose Route Frequency Provider Last Rate Last Admin   acetaminophen (TYLENOL) tablet 650 mg  650 mg Oral Q6H PRN Elgergawy, Leana Roe, MD       Or   acetaminophen (TYLENOL) suppository 650 mg  650 mg Rectal Q6H PRN Elgergawy, Leana Roe, MD       allopurinol (ZYLOPRIM) tablet 300 mg  300 mg Oral QHS Elgergawy, Leana Roe, MD   300 mg at 01/06/23 2101   Chlorhexidine Gluconate Cloth 2 %  PADS 6 each  6 each Topical Daily Danford, Earl Lites, MD   6 each at 01/07/23 1610   haloperidol lactate (HALDOL) injection 2 mg  2 mg Intravenous Q6H PRN Danford, Earl Lites, MD       lidocaine (PF) (XYLOCAINE) 1 % injection 30 mL  30 mL Infiltration Once Elgergawy, Leana Roe, MD       magnesium oxide (MAG-OX) tablet 400 mg  400 mg Oral BID Elgergawy, Leana Roe, MD   400 mg at 01/07/23 9604   melatonin tablet 3 mg  3 mg Oral QHS Danford, Earl Lites, MD   3 mg at 01/06/23 2101   ondansetron (ZOFRAN) injection 4 mg  4 mg Intravenous Q6H PRN Alberteen Sam, MD   4 mg at 01/06/23  1510   oxyCODONE (Oxy IR/ROXICODONE) immediate release tablet 5 mg  5 mg Oral Q6H PRN Elgergawy, Leana Roe, MD   5 mg at 01/06/23 2058   polyethylene glycol (MIRALAX / GLYCOLAX) packet 17 g  17 g Oral Daily PRN Elgergawy, Leana Roe, MD       sodium chloride flush (NS) 0.9 % injection 10-40 mL  10-40 mL Intracatheter Q12H Danford, Earl Lites, MD   10 mL at 01/07/23 0917   tamsulosin (FLOMAX) capsule 0.8 mg  0.8 mg Oral Daily Elgergawy, Leana Roe, MD   0.8 mg at 01/05/23 1823   Facility-Administered Medications Ordered in Other Encounters  Medication Dose Route Frequency Provider Last Rate Last Admin   octreotide (SANDOSTATIN LAR) 30 MG IM injection            sodium chloride flush (NS) 0.9 % injection 20 mL  20 mL Intravenous PRN Doreatha Massed, MD   20 mL at 07/21/19 1053     Discharge Medications: Please see discharge summary for a list of discharge medications.  Relevant Imaging Results:  Relevant Lab Results:   Additional Information SS# [id number]  Deatra Robinson, [place]

## 2023-01-08 DIAGNOSIS — I10 Essential (primary) hypertension: Secondary | ICD-10-CM | POA: Diagnosis not present

## 2023-01-08 DIAGNOSIS — C9200 Acute myeloblastic leukemia, not having achieved remission: Secondary | ICD-10-CM | POA: Diagnosis not present

## 2023-01-08 DIAGNOSIS — G9341 Metabolic encephalopathy: Secondary | ICD-10-CM | POA: Diagnosis not present

## 2023-01-08 NOTE — Progress Notes (Signed)
  Progress Note   Patient: Richard Wallace QVZ:[id number] DOB: 12-04-1945 DOA: 01/05/2023     3 DOS: the patient was seen and examined on 01/08/2023 at 9:11 AM      Brief hospital course: 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  In the ER, MRI brain showed leptomeningeal enhancement, so he was started on empiric antibiotics and admitted for LP     Assessment and Plan: * Acute metabolic encephalopathy Initial differential included leukemic invasion of CNS, infection.  Admitted and underwent LP.    Cell count shows pleocytosis, but on microscopy, these are atpypical WBCs, suggesting malignancy.  ME PCR panel completely negative.  Antibiotics stopped.  Discussed with oncology, Dr. Ellin Saba.  If this preliminary result is correct, he is not a candidate for further chemo, and hospice would be recommended.  Hematopathology will re-review slides on Tuesday and complete flow cytometry on available CSF samples.  These results may be available to Dr. Ellin Saba later in the week.  In awaiting these results, family and patient wish to attempt rehabilitation at SNF.   [facility] consult for SNF placement - Recommend Palliative to follow at SNF - Follow up with Dr. Ellin Saba after further pathology analysis  - If testing confirms CNS leukemia, he would recommend hospice - If testing inconclusive, he may recommend repeat LP     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HCC) See above   Essential hypertension - Continue amlodipine          Subjective: No nursing concerns, wife reports he is calm and slept well overnight.     Physical Exam: BP (!) 149/94 (BP Location: Left Arm)   Pulse 81   Temp 98.9 F (37.2 C) (Oral)   Resp 18   Ht 5' 9.5" (1.765 m)   Wt 98.9 kg   SpO2 94%   BMI 31.73 kg/m   Elderly adult male, sleeping, comfortably RRR, no murmurs, no peripheral edema Respiratory normal, lungs clear  without rales or wheezes   Data Reviewed:   Family Communication: Wife at the bedside    Disposition: Status is: Inpatient         Author: Alberteen Sam, MD 01/08/2023 2:21 PM  For on call review [email or web address].

## 2023-01-08 NOTE — Plan of Care (Signed)

## 2023-01-08 NOTE — Plan of Care (Signed)
Problem: Education: Goal: Knowledge of General Education information will improve Description: Including pain rating scale, medication(s)/side effects and non-pharmacologic comfort measures 01/08/2023 1856 by Wylie Hail, RN Outcome: Progressing 01/08/2023 1820 by Wylie Hail, RN Outcome: Progressing   Problem: Health Behavior/Discharge Planning: Goal: Ability to manage health-related needs will improve 01/08/2023 1856 by Wylie Hail, RN Outcome: Progressing 01/08/2023 1820 by Wylie Hail, RN Outcome: Progressing   Problem: Clinical Measurements: Goal: Ability to maintain clinical measurements within normal limits will improve 01/08/2023 1856 by Wylie Hail, RN Outcome: Progressing 01/08/2023 1820 by Wylie Hail, RN Outcome: Progressing Goal: Will remain free from infection 01/08/2023 1856 by Wylie Hail, RN Outcome: Progressing 01/08/2023 1820 by Wylie Hail, RN Outcome: Progressing Goal: Diagnostic test results will improve 01/08/2023 1856 by Wylie Hail, RN Outcome: Progressing 01/08/2023 1820 by Wylie Hail, RN Outcome: Progressing Goal: Respiratory complications will improve 01/08/2023 1856 by Wylie Hail, RN Outcome: Progressing 01/08/2023 1820 by Wylie Hail, RN Outcome: Progressing Goal: Cardiovascular complication will be avoided 01/08/2023 1856 by Wylie Hail, RN Outcome: Progressing 01/08/2023 1820 by Wylie Hail, RN Outcome: Progressing   Problem: Activity: Goal: Risk for activity intolerance will decrease 01/08/2023 1856 by Wylie Hail, RN Outcome: Progressing 01/08/2023 1820 by Wylie Hail, RN Outcome: Progressing   Problem: Nutrition: Goal: Adequate nutrition will be maintained 01/08/2023 1856 by Wylie Hail, RN Outcome: Progressing 01/08/2023 1820 by Wylie Hail, RN Outcome: Progressing   Problem: Coping: Goal: Level of anxiety will decrease 01/08/2023 1856 by Wylie Hail, RN Outcome: Progressing 01/08/2023 1820 by Wylie Hail, RN Outcome: Progressing   Problem: Elimination: Goal: Will not experience complications related to bowel motility 01/08/2023 1856 by Wylie Hail, RN Outcome: Progressing 01/08/2023 1820 by Wylie Hail, RN Outcome: Progressing Goal: Will not experience complications related to urinary retention 01/08/2023 1856 by Wylie Hail, RN Outcome: Progressing 01/08/2023 1820 by Wylie Hail, RN Outcome: Progressing   Problem: Pain Managment: Goal: General experience of comfort will improve 01/08/2023 1856 by Wylie Hail, RN Outcome: Progressing 01/08/2023 1820 by Wylie Hail, RN Outcome: Progressing   Problem: Safety: Goal: Ability to remain free from injury will improve 01/08/2023 1856 by Wylie Hail, RN Outcome: Progressing 01/08/2023 1820 by Wylie Hail, RN Outcome: Progressing   Problem: Skin Integrity: Goal: Risk for impaired skin integrity will decrease 01/08/2023 1856 by Wylie Hail, RN Outcome: Progressing 01/08/2023 1820 by Wylie Hail, RN Outcome: Progressing   Problem: Education: Goal: Knowledge of General Education information will improve Description: Including pain rating scale, medication(s)/side effects and non-pharmacologic comfort measures 01/08/2023 1856 by Wylie Hail, RN Outcome: Progressing 01/08/2023 1820 by Wylie Hail, RN Outcome: Progressing   Problem: Health Behavior/Discharge Planning: Goal: Ability to manage health-related needs will improve 01/08/2023 1856 by Wylie Hail, RN Outcome: Progressing 01/08/2023 1820 by Wylie Hail, RN Outcome: Progressing   Problem: Clinical Measurements: Goal: Ability to maintain clinical measurements within normal limits will improve 01/08/2023 1856 by Wylie Hail, RN Outcome: Progressing 01/08/2023 1820 by Wylie Hail, RN Outcome: Progressing Goal: Will remain free from infection 01/08/2023 1856 by Wylie Hail, RN Outcome: Progressing 01/08/2023 1820 by Wylie Hail, RN Outcome:  Progressing Goal: Diagnostic test results will improve 01/08/2023 1856 by Wylie Hail, RN Outcome: Progressing 01/08/2023 1820 by Wylie Hail, RN Outcome: Progressing Goal: Respiratory complications will improve 01/08/2023 1856 by  Wylie Hail, RN Outcome: Progressing 01/08/2023 1820 by Wylie Hail, RN Outcome: Progressing Goal: Cardiovascular complication will be avoided 01/08/2023 1856 by Wylie Hail, RN Outcome: Progressing 01/08/2023 1820 by Wylie Hail, RN Outcome: Progressing   Problem: Activity: Goal: Risk for activity intolerance will decrease 01/08/2023 1856 by Wylie Hail, RN Outcome: Progressing 01/08/2023 1820 by Wylie Hail, RN Outcome: Progressing   Problem: Nutrition: Goal: Adequate nutrition will be maintained 01/08/2023 1856 by Wylie Hail, RN Outcome: Progressing 01/08/2023 1820 by Wylie Hail, RN Outcome: Progressing   Problem: Coping: Goal: Level of anxiety will decrease 01/08/2023 1856 by Wylie Hail, RN Outcome: Progressing 01/08/2023 1820 by Wylie Hail, RN Outcome: Progressing   Problem: Elimination: Goal: Will not experience complications related to bowel motility 01/08/2023 1856 by Wylie Hail, RN Outcome: Progressing 01/08/2023 1820 by Wylie Hail, RN Outcome: Progressing Goal: Will not experience complications related to urinary retention 01/08/2023 1856 by Wylie Hail, RN Outcome: Progressing 01/08/2023 1820 by Wylie Hail, RN Outcome: Progressing   Problem: Pain Managment: Goal: General experience of comfort will improve 01/08/2023 1856 by Wylie Hail, RN Outcome: Progressing 01/08/2023 1820 by Wylie Hail, RN Outcome: Progressing   Problem: Safety: Goal: Ability to remain free from injury will improve 01/08/2023 1856 by Wylie Hail, RN Outcome: Progressing 01/08/2023 1820 by Wylie Hail, RN Outcome: Progressing   Problem: Skin Integrity: Goal: Risk for impaired skin integrity will decrease 01/08/2023  1856 by Wylie Hail, RN Outcome: Progressing 01/08/2023 1820 by Wylie Hail, RN Outcome: Progressing

## 2023-01-08 NOTE — TOC Progression Note (Addendum)
Transition of Care [facility]) - Progression Note    Patient Details  Name: Richard Wallace MRN: [id number] Date of Birth: [birth year]  [facility]) CM/SW Contact  Mearl Latin, LCSW Phone Number: 01/08/2023, 9:11 AM  Clinical Narrative:    CSW left voicemail for [facility] requesting patient referral be reviewed. CSW also sent referral via email to [facility] in admissions. Unsure if admissions are working today due to holiday.   CSW contacted [facility] and was told no one is working in admissions today. CSW faxed referral so they can review tomorrow.    Expected Discharge Plan: Skilled Nursing Facility Barriers to Discharge: SNF Pending bed offer  Expected Discharge Plan and Services   Discharge Planning Services: CM Consult Post Acute Care Choice: Skilled Nursing Facility Living arrangements for the past 2 months: Single Family Home                                       Social Determinants of Health (SDOH) Interventions SDOH Screenings   Food Insecurity: No Food Insecurity (01/05/2023)  Housing: Low Risk  (01/05/2023)  Transportation Needs: Unmet Transportation Needs (01/05/2023)  Utilities: Not At Risk (01/05/2023)  Alcohol Screen: Low Risk  (10/30/2022)  Depression (PHQ2-9): Low Risk  (10/31/2022)  Financial Resource Strain: Low Risk  (10/30/2022)  Physical Activity: Unknown (10/30/2022)  Social Connections: Moderately Isolated (10/30/2022)  Stress: No Stress Concern Present (10/30/2022)  Tobacco Use: Medium Risk (01/05/2023)    Readmission Risk Interventions     No data to display

## 2023-01-09 ENCOUNTER — Other Ambulatory Visit: Payer: Self-pay

## 2023-01-09 DIAGNOSIS — G9341 Metabolic encephalopathy: Secondary | ICD-10-CM | POA: Diagnosis not present

## 2023-01-09 LAB — CD19 AND CD20, FLOW CYTOMETRY
% CD19: 0.2 % — ABNORMAL LOW (ref 4.6–22.1)
% CD20: 0.2 % — ABNORMAL LOW (ref 5.0–22.3)

## 2023-01-09 LAB — CSF CULTURE W GRAM STAIN: Culture: NO GROWTH

## 2023-01-09 LAB — HSV 1/2 PCR, CSF
HSV-1 DNA: NEGATIVE
HSV-2 DNA: NEGATIVE

## 2023-01-09 MED ORDER — INFLUENZA VAC A&B SURF ANT ADJ 0.5 ML IM SUSY
0.5000 mL | PREFILLED_SYRINGE | INTRAMUSCULAR | Status: AC
  Administered 2023-01-10: 0.5 mL via INTRAMUSCULAR
  Filled 2023-01-09: qty 0.5

## 2023-01-09 NOTE — Progress Notes (Signed)
  Progress Note   Patient: Abdullah Gravell YNW:[id number] DOB: Feb 26, 1946 DOA: 01/05/2023     4 DOS: the patient was seen and examined on 01/09/2023 at 9:11 AM      Brief hospital course: 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  In the ER, MRI brain showed leptomeningeal enhancement, so he was started on empiric antibiotics and admitted for LP     Assessment and Plan: * Acute metabolic encephalopathy Initial differential included leukemic invasion of CNS, infection.  Admitted and underwent LP.    Cell count shows pleocytosis, but on microscopy, these are atpypical WBCs, suggesting malignancy.  ME PCR panel completely negative.  Antibiotics stopped.  Discussed with oncology, Dr. Ellin Saba.  If this preliminary result is correct, he is not a candidate for further chemo, and hospice would be recommended.  Hematopathology will re-review slides on Tuesday and complete flow cytometry on available CSF samples.  These results may be available to Dr. Ellin Saba later in the week.  In awaiting these results, family and patient wish to attempt rehabilitation at SNF.   [facility] consult for SNF placement - Recommend Palliative to follow at SNF - Follow up with Dr. Ellin Saba after further pathology analysis  - If testing confirms CNS leukemia, he would recommend hospice - If testing inconclusive, he may recommend repeat LP     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HCC) See above   Essential hypertension - Continue amlodipine          Subjective: Patient seen and examined.  Wife at the bedside.  Patient alert and oriented to self only.  Requires a lot of prompting.  Physical Exam: BP 125/79 (BP Location: Left Arm)   Pulse 83   Temp 97.9 F (36.6 C) (Oral)   Resp 19   Ht 5' 9.5" (1.765 m)   Wt 98.9 kg   SpO2 93%   BMI 31.73 kg/m   General exam: Appears calm and comfortable  Respiratory system: Clear to  auscultation. Respiratory effort normal. Cardiovascular system: S1 & S2 heard, RRR. No JVD, murmurs, rubs, gallops or clicks. No pedal edema. Gastrointestinal system: Abdomen is nondistended, soft and nontender. No organomegaly or masses felt. Normal bowel sounds heard. Central nervous system: Alert and oriented x 1. No focal neurological deficits. Extremities: Symmetric 5 x 5 power. Skin: No rashes, lesions or ulcers.  Psychiatry: Judgement and insight appear poor   Data Reviewed:   Family Communication: Wife at the bedside    Disposition: Pending placement to SNF. Status is: Inpatient         Author: Hughie Closs, MD 01/09/2023 9:24 AM  For on call review [email or web address].

## 2023-01-09 NOTE — Plan of Care (Signed)

## 2023-01-09 NOTE — TOC Progression Note (Addendum)
Transition of Care [facility]) - Progression Note    Patient Details  Name: Richard Wallace MRN: [id number] Date of Birth: 1945/06/02  [facility]) CM/SW Contact  Baldemar Lenis, [place] Phone Number: 01/09/2023, 9:59 AM  Clinical Narrative:   CSW attempted to contact [name] and [facility] to check on status of referrals. [facility] [place] still catching up on referrals over the weekend, will call CSW after review. CSW left voicemail for [facility], awaiting call back. CSW to follow.  UPDATE: CSW received call from [facility] that they were reviewing referral, but had questions about patient's appropriateness for rehab if he was wheelchair bound at baseline and to make sure the plan was for return home afterwards. CSW to meet with wife and discuss tomorrow.    Expected Discharge Plan: Skilled Nursing Facility Barriers to Discharge: SNF Pending bed offer  Expected Discharge Plan and Services   Discharge Planning Services: CM Consult Post Acute Care Choice: Skilled Nursing Facility Living arrangements for the past 2 months: Single Family Home                                       Social Determinants of Health (SDOH) Interventions SDOH Screenings   Food Insecurity: No Food Insecurity (01/05/2023)  Housing: Low Risk  (01/05/2023)  Transportation Needs: Unmet Transportation Needs (01/05/2023)  Utilities: Not At Risk (01/05/2023)  Alcohol Screen: Low Risk  (10/30/2022)  Depression (PHQ2-9): Low Risk  (10/31/2022)  Financial Resource Strain: Low Risk  (10/30/2022)  Physical Activity: Unknown (10/30/2022)  Social Connections: Moderately Isolated (10/30/2022)  Stress: No Stress Concern Present (10/30/2022)  Tobacco Use: Medium Risk (01/05/2023)    Readmission Risk Interventions     No data to display

## 2023-01-10 DIAGNOSIS — I10 Essential (primary) hypertension: Secondary | ICD-10-CM | POA: Diagnosis not present

## 2023-01-10 DIAGNOSIS — G9341 Metabolic encephalopathy: Secondary | ICD-10-CM | POA: Diagnosis not present

## 2023-01-10 DIAGNOSIS — C9200 Acute myeloblastic leukemia, not having achieved remission: Secondary | ICD-10-CM | POA: Diagnosis not present

## 2023-01-10 LAB — URINALYSIS, W/ REFLEX TO CULTURE (INFECTION SUSPECTED)
Bacteria, UA: NONE SEEN
Bilirubin Urine: NEGATIVE
Glucose, UA: NEGATIVE mg/dL
Hgb urine dipstick: NEGATIVE
Ketones, ur: NEGATIVE mg/dL
Nitrite: NEGATIVE
Protein, ur: 30 mg/dL — AB
Specific Gravity, Urine: 1.018 (ref 1.005–1.030)
pH: 5 (ref 5.0–8.0)

## 2023-01-10 MED ORDER — DOCUSATE SODIUM 100 MG PO CAPS
100.0000 mg | ORAL_CAPSULE | Freq: Two times a day (BID) | ORAL | Status: DC
  Administered 2023-01-10 – 2023-01-15 (×10): 100 mg via ORAL
  Filled 2023-01-10 (×12): qty 1

## 2023-01-10 MED ORDER — DIPHENHYDRAMINE HCL 25 MG PO CAPS
25.0000 mg | ORAL_CAPSULE | Freq: Three times a day (TID) | ORAL | Status: DC | PRN
  Administered 2023-01-11 – 2023-01-12 (×2): 25 mg via ORAL
  Filled 2023-01-10 (×3): qty 1

## 2023-01-10 MED ORDER — POLYETHYLENE GLYCOL 3350 17 G PO PACK
17.0000 g | PACK | Freq: Every day | ORAL | Status: DC
  Administered 2023-01-11 – 2023-01-15 (×4): 17 g via ORAL
  Filled 2023-01-10 (×5): qty 1

## 2023-01-10 MED ORDER — BISACODYL 10 MG RE SUPP
10.0000 mg | Freq: Every day | RECTAL | Status: DC | PRN
  Administered 2023-01-10 – 2023-01-11 (×2): 10 mg via RECTAL
  Filled 2023-01-10 (×2): qty 1

## 2023-01-10 NOTE — Progress Notes (Signed)
Occupational Therapy Treatment Patient Details Name: Richard Wallace MRN: [id number] DOB: 1945-09-27 Today's Date: 01/10/2023   History of present illness 77 y.o. male Presents to ED 01/05/23 secondary to complaints intermittent episodes of confusion, slurred speech, dysarthria, expressive aphasia x 1 week. 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   OT comments  Pt progressing toward established OT goals Able to follow one step commands with occasional gestures this session and perform bilateral tasks at EOB to optimize coordination, hand/core strength, cognition, and return to ADL. Pt with poor dexterity and coordination of digits, requiring significant assist for bil FM tasks such as doffing toothpaste cap and applying toothpaste but good participation throughout task and in brushing teeth. Pt able to stand up from EOB 2x this session with good power up with mod A of 2 and able to take steps toward [facility] with RW. Pt wife reports much improved from attempts at mobility with nursing staff yesterday. Pt with good potential and continuing to recommend inpatient rehabilitation <3 hours/day to optimize safety and independence in ADL.       If plan is discharge home, recommend the following:  A lot of help with walking and/or transfers;Two people to help with walking and/or transfers;A lot of help with bathing/dressing/bathroom;Two people to help with bathing/dressing/bathroom;Assistance with cooking/housework;Assistance with feeding;Assist for transportation;Help with stairs or ramp for entrance   Equipment Recommendations  Hospital bed;Hoyer lift     Recommendations for Other Services      Precautions / Restrictions Precautions Precautions: Fall Restrictions Weight Bearing Restrictions: No       Mobility Bed Mobility Overal bed mobility: Needs Assistance Bed Mobility: Supine to Sit, Sit to Supine, Rolling Rolling: Contact guard assist   Supine to sit: Mod assist Sit to supine: Contact guard assist   General bed mobility comments: assistance for trunk support and to scoot hips forward in preparation for standing. Good initiation of rolling in bed for repositoning at end of session with one cue for hand placement    Transfers Overall transfer level: Needs assistance Equipment used: Rolling walker (2 wheels) Transfers: Sit to/from Stand Sit to Stand: Mod assist, +2 physical assistance           General transfer comment: cues for anterior weight shifting and hand placement. patient has tendency to pull up using rolling walker despite cues. No BLE buckling today; mild shakiness. 2 bouts of standing performed from bed in lowest position     Balance Overall balance assessment: Needs assistance Sitting-balance support: Feet supported, No upper extremity supported Sitting balance-Leahy Scale: Fair Sitting balance - Comments: occasional assistance provided to correct to midline due to right side lean with dynamic activity. otherwise, close stand by assistance for safety Postural control: Right lateral lean (intermittent) Standing balance support: Bilateral upper extremity supported Standing balance-Leahy Scale: Poor Standing balance comment: external support required with heavy reliance on rolling walker for support                           ADL either performed or assessed with clinical judgement   ADL Overall ADL's : Needs assistance/impaired     Grooming: Moderate assistance;Sitting;Wash/dry face;Oral care Grooming Details (indicate cue type and reason): CGA to wash  face. Mod A with heavy assistance for  removing toothpaste cap and donning toothbaste as well as problem solving for grasp on toothbrush                 Toilet Transfer: Moderate assistance;+2 for safety/equipment;+2 for physical assistance;Stand-pivot Toilet Transfer Details (indicate cue type and reason): Able to take side steps towrd [facility] with mod A of 2 and mod cues in addition to intermittent rest breaks         Functional mobility during ADLs: Moderate assistance;+2 for physical assistance;+2 for safety/equipment      Extremity/Trunk Assessment Upper Extremity Assessment Upper Extremity Assessment: Generalized weakness;LUE deficits/detail;RUE deficits/detail RUE Deficits / Details: RUE with decreased strength and coordination as compared to L; greater differences distally than proximally. grip/push 3+/5; shoulder flexion 5/5; pull 4/5. 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 Poor FM coordination and dexterity and needing cues for motor planning. LUE Deficits / Details: decresaed coordination. Poor coordination and dexterity but has greater coordination in L than R overalll. 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 Able to see large objects and threats, but difficulty with depth perception. Continues to deny double vision   Perception     Praxis      Cognition Arousal: Alert Behavior During Therapy: WFL for tasks assessed/performed Overall Cognitive Status: Impaired/Different from baseline Area of Impairment: Orientation, Attention, Memory, Following commands, Safety/judgement, Problem solving, Awareness                 Orientation Level: Disoriented to, Time, Situation Current Attention Level: Sustained Memory: Decreased short-term memory Following Commands: Follows one step commands consistently, Follows one step  commands with increased time Safety/Judgement: Decreased awareness of deficits, Decreased awareness of safety Awareness: Intellectual, Emergent (when asked to stand up, reporting "I will try but I am no good") Problem Solving: Slow processing, Requires verbal cues, Difficulty sequencing General Comments: patient is able to follow simple one step commands (roll, lean forward, stand, wash your face) most of the time with increased time and occasional gestures. difficulty with multi step commnads or one step commands with multistep sequences/nees for BUE coordination (able to sequence through steps of oral care, but assist manipulating toothpaste due to coordination difficulty and decr problem solving). patient is cooperative throughout session. spouse reports patient is generally more confused than usual; short time frame yesterday of agitation, but resolved and pt very pleasant today        Exercises      Shoulder Instructions       General Comments Pt spouse very supportive and hopeful for improved participation in ADL resulting in improved QOL. At this time, agreeable to rehab    Pertinent Vitals/ Pain       Pain Assessment Pain Assessment: No/denies pain  Home Living                                          Prior Functioning/Environment              Frequency  Min 2X/week        Progress Toward Goals  OT Goals(current goals can now be found in the care plan section)  Progress towards OT goals: Progressing toward goals  Acute Rehab OT Goals  Patient Stated Goal: get stronger OT Goal Formulation: With patient Time For Goal Achievement: 01/21/23 Potential to Achieve Goals: Fair ADL Goals Pt Will Perform Eating: with set-up;with supervision;sitting Pt Will Perform Grooming: with set-up;with supervision;sitting Pt Will Perform Upper Body Dressing: with set-up;with supervision;sitting Pt Will Perform Lower Body Dressing: sit to/from stand;with min  assist Pt Will Transfer to Toilet: with min assist;stand pivot transfer  Plan      Co-evaluation    PT/OT/SLP Co-Evaluation/Treatment: Yes Reason for Co-Treatment: Complexity of the patient's impairments (multi-system involvement);To address functional/ADL transfers;For patient/therapist safety PT goals addressed during session: Mobility/safety with mobility OT goals addressed during session: ADL's and self-care      AM-PAC OT "6 Clicks" Daily Activity     Outcome Measure   Help from another person eating meals?: A Little Help from another person taking care of personal grooming?: A Little Help from another person toileting, which includes using toliet, bedpan, or urinal?: A Lot Help from another person bathing (including washing, rinsing, drying)?: A Lot Help from another person to put on and taking off regular upper body clothing?: A Lot Help from another person to put on and taking off regular lower body clothing?: A Lot 6 Click Score: 14    End of Session Equipment Utilized During Treatment: Gait belt;Rolling walker (2 wheels)  OT Visit Diagnosis: Unsteadiness on feet (R26.81);Muscle weakness (generalized) (M62.81);Other symptoms and signs involving cognitive function;Other abnormalities of gait and mobility (R26.89);Cognitive communication deficit (R41.841)   Activity Tolerance Patient tolerated treatment well   Patient Left in bed;with call bell/phone within reach   Nurse Communication Mobility status        Time: 1345-1420 OT Time Calculation (min): 35 min  Charges: OT General Charges $OT Visit: 1 Visit OT Treatments $Self Care/Home Management : 8-22 mins  Tyler Deis, OTR/L [facility] Acute Rehabilitation Office: [phone]   Myrla Halsted 01/10/2023, 3:36 PM

## 2023-01-10 NOTE — Progress Notes (Signed)
  Progress Note   Patient: Richard Wallace ZOX:[id number] DOB: 1945-07-11 DOA: 01/05/2023     5 DOS: the patient was seen and examined on 01/10/2023        Brief hospital course: 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  In the ER, MRI brain showed leptomeningeal enhancement, so he was started on empiric antibiotics and admitted for LP     Assessment and Plan: * Acute metabolic encephalopathy 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HCC) Discussed with pathology, there are blasts in the CSF, but also immature myeloid cells and mature myelocytes.  This is an atypical picture.  They will discuss with Oncology.  I feel that the chances that the lumbar puncture was contaminated with bone marrow is exceedingly low. - Appreciate Oncology assistance    Hematuria Had some transient gross hematuria today. - Check UA   Essential hypertension - Continue amlodipine          Subjective: No new complaints.  Oral intake is low.  Mentation is unchanged. He is weak and unsteady but willing to work with PT.     Physical Exam: BP (P) 121/74 (BP Location: Left Arm)   Pulse 94   Temp 98.3 F (36.8 C) (Oral)   Resp 18   Ht 5' 9.5" (1.765 m)   Wt 98.9 kg   SpO2 98%   BMI 31.73 kg/m   Elderly adult male, sleeping, comfortably RRR, no murmurs, no peripheral edema Respiratory normal, lungs clear without rales or wheezes  Data Reviewed: Discussed with Pathology  Family Communication: Wife at bedside    Disposition: Status is: Inpatient The patient would benefit from daily physical therapy to return to his prior level of function, despite concerns about his worsening disease.        Author: Alberteen Sam, MD 01/10/2023 4:55 PM  For on call review [email or web address].

## 2023-01-10 NOTE — Care Management Important Message (Signed)
Important Message  Patient Details  Name: Richard Wallace MRN: [id number] Date of Birth: 1945-09-18   Medicare Important Message Given:  Yes     [name] Stefan Church 01/10/2023, 4:32 PM

## 2023-01-10 NOTE — Progress Notes (Addendum)
Nutrition Follow-up  DOCUMENTATION CODES:   Obesity unspecified  INTERVENTION:  - Continue Heart Healthy diet.   - Continue Ensure Enlive po BID, each supplement provides 350 kcal and 20 grams of protein.  NUTRITION DIAGNOSIS:   Inadequate oral intake related to lethargy/confusion as evidenced by meal completion < 50%.  GOAL:   Patient will meet greater than or equal to 90% of their needs  MONITOR:   PO intake, Supplement acceptance  REASON FOR ASSESSMENT:   Consult Calorie Count  ASSESSMENT:   77 y.o. male admits related to AMS. PMH includes: arthritis, CKD, GERD,HTN. Pt is currently receiving medical management related to acute metabolic encephalopathy.  Meds reviewed: mag-ox, MVI. Labs reviewed: WDL.   RD went to collect calorie count and there were only 2 tickets to collect. Spoke with MD about discontinuing calorie count as patient has been eating well per record and is planning to discharge to SNF soon. Pt currently has Ensure BID ordered and has been accepting them intermittently. RD will continue to monitor PO intakes.   Pt was being seen by another provider at time of assessment. RD will attempt NFPE at f/u. Pt without BM in 5 days, may benefit from a scheduled bowel regimen. Will send a message to MD.   NUTRITION - FOCUSED PHYSICAL EXAM:  Attempt at f/u.  Diet Order:   Diet Order             Diet Heart Room service appropriate? Yes; Fluid consistency: Thin  Diet effective now                   EDUCATION NEEDS:   Not appropriate for education at this time  Skin:  Skin Assessment: Reviewed RN Assessment  Last BM:  8/30  Height:   Ht Readings from Last 1 Encounters:  01/05/23 5' 9.5" (1.765 m)    Weight:   Wt Readings from Last 1 Encounters:  01/05/23 98.9 kg    Ideal Body Weight:     BMI:  Body mass index is 31.73 kg/m.  Estimated Nutritional Needs:   Kcal:  2000-2400 kcals  Protein:  100-120 gm  Fluid:  >/= 2 L  Bethann Humble, RD, LDN, CNSC.

## 2023-01-10 NOTE — Care Management Important Message (Signed)
Important Message  Patient Details  Name: Richard Wallace MRN: [id number] Date of Birth: 1945/09/05   Medicare Important Message Given:  Yes     Shermika Balthaser Stefan Church 01/10/2023, 4:30 PM

## 2023-01-10 NOTE — Progress Notes (Addendum)
Physical Therapy Treatment Patient Details Name: Nekhi Oen MRN: [id number] DOB: 04-Jan-1946 Today's Date: 01/10/2023   History of Present Illness 77 y.o. male Presents to ED 01/05/23 secondary to complaints intermittent episodes of confusion, slurred speech, dysarthria, expressive aphasia x 1 week. 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    PT Comments  Patient is agreeable and cooperative. Supportive spouse at the beside. Patient is able to follow single step commands with minimal cues. He required one person assistance for bed mobility. Two standing bouts performed from bed with cues for hand placement and anterior weight shifting. Mod A +2 person assistance required. Standing tolerance of around 1 minute x 2 bouts and patient able to take several side steps along edge of bed with assistance.  The patient is not at his baseline level of functional independence. The spouse reports her goal is for patient to be more independent with bed mobility and transfers. Consider short term rehab of <3 hours per day to continue working with PT to maximize independence and decrease caregiver burden.     If plan is discharge home, recommend the following: A little help with walking and/or transfers;Assistance with cooking/housework;Direct supervision/assist for medications management;Direct supervision/assist for financial management;Assist for transportation;Help with stairs or ramp for entrance;Supervision due to cognitive status   Can travel by private vehicle     No  Equipment Recommendations  Hospital bed, [facility], hoyer lift recommended if going home.     Recommendations for Other Services       Precautions / Restrictions Precautions Precautions: Fall Restrictions Weight Bearing Restrictions: No     Mobility  Bed Mobility Overal bed mobility: Needs Assistance Bed Mobility: Supine to Sit, Sit to Supine, Rolling Rolling: Contact guard assist   Supine to sit: Mod assist Sit to supine: Contact guard assist   General bed mobility comments: assistance for trunk support and to scoot hips forward in preparation for standing    Transfers Overall transfer level: Needs assistance Equipment used: Rolling walker (2 wheels) Transfers: Sit to/from Stand Sit to Stand: Mod assist, +2 physical assistance           General transfer comment: cues for anterior weight shifting and hand placement. patient has tendency to pull up using rolling walker despite cues. 2 bouts of standing performed from bed    Ambulation/Gait             Pre-gait activities: patient is able to take several side steps with Mod A +2 person assistance to the right with cues for task initiation. weight shifting assistance provided as well as assistance for moving rolling walker. standing tolerance was around 1 minute x 2 bouts General Gait Details: patient uses power wheelchair as primary means of mobility   Stairs             Wheelchair Mobility     Tilt Bed    Modified Rankin (Stroke Patients Only)       Balance Overall balance assessment: Needs assistance Sitting-balance support: Feet supported, No upper extremity supported Sitting balance-Leahy Scale: Fair Sitting balance - Comments: occasional assistance provided to correct to midline due to right side lean with dynamic activity. otherwise, close stand by assistance for safety Postural control: Right lateral lean (intermittent) Standing balance  support: Bilateral upper extremity supported Standing balance-Leahy Scale: Poor Standing balance comment: external support required with heavy  reliance on rolling walker for support                            Cognition Arousal: Alert Behavior During Therapy: WFL for tasks assessed/performed Overall Cognitive Status: Impaired/Different from baseline                                 General Comments: patient is able to follow simple commands most of the time with increased time and occasional gestures. difficulty with multi step commnads. patient is cooperative throughout session. spouse reports patient is generally more confused than usual and has been hallucinating        Exercises      General Comments General comments (skin integrity, edema, etc.): spouse is very supportive. she expressed her goal is for patient to get short term rehab for increased independence with bed mobility and transfers.      Pertinent Vitals/Pain Pain Assessment Pain Assessment: No/denies pain    Home Living                          Prior Function            PT Goals (current goals can now be found in the care plan section) Acute Rehab PT Goals Patient Stated Goal: spouse goal is for more independence with bed mobility and transfers for decreased caregiver burden PT Goal Formulation: With patient/family Time For Goal Achievement: 01/21/23 Potential to Achieve Goals: Good Progress towards PT goals: Progressing toward goals    Frequency    Min 1X/week      PT Plan      Co-evaluation PT/OT/SLP Co-Evaluation/Treatment: Yes Reason for Co-Treatment: Complexity of the patient's impairments (multi-system involvement);To address functional/ADL transfers;For patient/therapist safety PT goals addressed during session: Mobility/safety with mobility        AM-PAC PT "6 Clicks" Mobility   Outcome Measure  Help needed turning from your back to your side while in a flat bed without using bedrails?: None Help needed moving from lying on your back to sitting on the side of a flat bed without using  bedrails?: A Little Help needed moving to and from a bed to a chair (including a wheelchair)?: Total Help needed standing up from a chair using your arms (e.g., wheelchair or bedside chair)?: Total Help needed to walk in hospital room?: Total Help needed climbing 3-5 steps with a railing? : Total 6 Click Score: 11    End of Session Equipment Utilized During Treatment: Gait belt Activity Tolerance: Patient tolerated treatment well Patient left: in bed;with call bell/phone within reach;with bed alarm set;with family/visitor present   PT Visit Diagnosis: Unsteadiness on feet (R26.81);Muscle weakness (generalized) (M62.81)     Time: 1345-1420 PT Time Calculation (min) (ACUTE ONLY): 35 min  Charges:    $Therapeutic Activity: 8-22 mins PT General Charges $$ ACUTE PT VISIT: 1 Visit                     Donna Bernard, PT, MPT    Ina Homes 01/10/2023, 3:00 PM

## 2023-01-10 NOTE — Plan of Care (Signed)
  Problem: Education: Goal: Knowledge of General Education information will improve Description: Including pain rating scale, medication(s)/side effects and non-pharmacologic comfort measures Outcome: Progressing   Problem: Health Behavior/Discharge Planning: Goal: Ability to manage health-related needs will improve Outcome: Not Progressing   Problem: Clinical Measurements: Goal: Ability to maintain clinical measurements within normal limits will improve Outcome: Progressing Goal: Will remain free from infection Outcome: Progressing Goal: Diagnostic test results will improve Outcome: Progressing

## 2023-01-10 NOTE — TOC Progression Note (Addendum)
Transition of Care [facility]) - Progression Note    Patient Details  Name: Richard Wallace MRN: [id number] Date of Birth: 05-21-1945  Transition of Care [facility]) CM/SW Contact  Richard Wallace, [place] Phone Number: 01/10/2023, 11:46 AM  Clinical Narrative:   CSW met with spouse at bedside to discuss questions from [facility] regarding patient's ability to participate in rehab. Spouse in agreement, indicating that he likely can't participate. Spouse would like to take the patient home with hospice. CSW discussed with spouse DME required, patient will need hospital bed, possibly lift. CSW provided spouse with choice of hospice agencies, she chose [facility]. CSW spoke with Richard Wallace with [facility], provided referral and discussed needs. Hospice to reach out to wife and plan for discharge home. CSW to follow.  UPDATE: CSW updated by PT after session that patient's spouse would still like to consider rehab, if possible, to reduce caregiver burden prior to transition to hospice. CSW contacted Richard Wallace to provide update on patient's prior function and desire to return home with wife after rehab, they will review and call CSW back. CSW to follow.    Expected Discharge Plan: Home w Hospice Care Barriers to Discharge: Continued Medical Work up, Equipment Delay  Expected Discharge Plan and Services   Discharge Planning Services: CM Consult Post Acute Care Choice: Skilled Nursing Facility Living arrangements for the past 2 months: Single Family Home                                       Social Determinants of Health (SDOH) Interventions SDOH Screenings   Food Insecurity: No Food Insecurity (01/05/2023)  Housing: Low Risk  (01/05/2023)  Transportation Needs: Unmet Transportation Needs (01/05/2023)  Utilities: Not At Risk (01/05/2023)  Alcohol Screen: Low Risk  (10/30/2022)  Depression (PHQ2-9): Low Risk  (10/31/2022)  Financial Resource Strain: Low Risk  (10/30/2022)  Physical Activity:  Unknown (10/30/2022)  Social Connections: Moderately Isolated (10/30/2022)  Stress: No Stress Concern Present (10/30/2022)  Tobacco Use: Medium Risk (01/05/2023)    Readmission Risk Interventions     No data to display

## 2023-01-11 DIAGNOSIS — G9341 Metabolic encephalopathy: Secondary | ICD-10-CM | POA: Diagnosis not present

## 2023-01-11 LAB — CYTOLOGY - NON PAP

## 2023-01-11 LAB — SURGICAL PATHOLOGY

## 2023-01-11 MED ORDER — METOCLOPRAMIDE HCL 5 MG/ML IJ SOLN
10.0000 mg | Freq: Once | INTRAMUSCULAR | Status: AC
  Administered 2023-01-11: 10 mg via INTRAVENOUS
  Filled 2023-01-11: qty 2

## 2023-01-11 MED ORDER — BISACODYL 10 MG RE SUPP
10.0000 mg | Freq: Every day | RECTAL | Status: DC | PRN
  Administered 2023-01-12: 10 mg via RECTAL
  Filled 2023-01-11: qty 1

## 2023-01-11 NOTE — Progress Notes (Signed)
  Progress Note   Patient: Richard Wallace NFA:[id number] DOB: 09/01/1945 DOA: 01/05/2023     6 DOS: the patient was seen and examined on 01/11/2023 at 10:03 AM      Brief Wallace course: 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  In the ER, MRI brain showed leptomeningeal enhancement, so he was started on empiric antibiotics and admitted for LP     Assessment and Plan: Acute metabolic encephalopathy, suspected due to leukemic involvement of the CNS See description from yesterday, still awaiting callback from Dr. Ellin Wallace for further instructions, in the meantime, suspect patient would have better performance status for any potential treatments if he were to have physical therapy at the time of discharge. - PT/OT - Full supportive cares  Hematuria No further gross hematuria, urinalysis unremarkable  Essential hypertension - Continue amlodipine       Subjective: Patient's memory still impaired, but overall his mental status is stable.  No fever, no new focal symptoms, no nursing concerns.     Physical Exam: BP 134/87 (BP Location: Left Arm)   Pulse 89   Temp 97.6 F (36.4 C) (Oral)   Resp 18   Ht 5' 9.5" (1.765 m)   Wt 98.9 kg   SpO2 96%   BMI 31.73 kg/m   RRR, no murmurs, no peripheral edema Respiratory rate normal, lungs clear without rales or wheezes Abdomen soft no tenderness palpation Mental status unchanged  Data Reviewed: Called to oncology, no answer  Family Communication: Called to wife, no answer    Disposition: Status is: Inpatient Patient medically stabilized but without safe disposition at present, Richard Wallace working towards delineating outpatient plan        Author: Alberteen Sam, MD 01/11/2023 1:47 PM  For on call review [email or web address].

## 2023-01-11 NOTE — Plan of Care (Signed)
°  Problem: Activity: °Goal: Risk for activity intolerance will decrease °Outcome: Progressing °  °Problem: Nutrition: °Goal: Adequate nutrition will be maintained °Outcome: Progressing °  °Problem: Elimination: °Goal: Will not experience complications related to bowel motility °Outcome: Progressing °  °Problem: Safety: °Goal: Ability to remain free from injury will improve °Outcome: Progressing °  °

## 2023-01-11 NOTE — TOC Progression Note (Addendum)
Transition of Care [facility]) - Progression Note    Patient Details  Name: Richard Wallace MRN: [id number] Date of Birth: 02-12-1946  Transition of Care [facility]) CM/SW Contact  Baldemar Lenis, [place] Phone Number: 01/11/2023, 1:30 PM  Clinical Narrative:   CSW has called [facility] multiple times today to leave messages asking on status of referral, awaiting call back. CSW updated MD with barrier to discharge. CSW to follow.  UPDATE: CSW finally reached Admissions at [facility] after multiple calls, they are not going to offer the patient a bed for rehab. CSW attempted to reach wife, Aurea Graff, to discuss, left a voicemail. CSW to follow.    Expected Discharge Plan: Skilled Nursing Facility Barriers to Discharge: Continued Medical Work up, Equipment Delay  Expected Discharge Plan and Services   Discharge Planning Services: CM Consult Post Acute Care Choice: Skilled Nursing Facility Living arrangements for the past 2 months: Single Family Home                                       Social Determinants of Health (SDOH) Interventions SDOH Screenings   Food Insecurity: No Food Insecurity (01/05/2023)  Housing: Low Risk  (01/05/2023)  Transportation Needs: Unmet Transportation Needs (01/05/2023)  Utilities: Not At Risk (01/05/2023)  Alcohol Screen: Low Risk  (10/30/2022)  Depression (PHQ2-9): Low Risk  (10/31/2022)  Financial Resource Strain: Low Risk  (10/30/2022)  Physical Activity: Unknown (10/30/2022)  Social Connections: Moderately Isolated (10/30/2022)  Stress: No Stress Concern Present (10/30/2022)  Tobacco Use: Medium Risk (01/05/2023)    Readmission Risk Interventions     No data to display

## 2023-01-12 DIAGNOSIS — G9341 Metabolic encephalopathy: Secondary | ICD-10-CM | POA: Diagnosis not present

## 2023-01-12 DIAGNOSIS — I10 Essential (primary) hypertension: Secondary | ICD-10-CM | POA: Diagnosis not present

## 2023-01-12 DIAGNOSIS — E871 Hypo-osmolality and hyponatremia: Secondary | ICD-10-CM | POA: Diagnosis present

## 2023-01-12 DIAGNOSIS — C9200 Acute myeloblastic leukemia, not having achieved remission: Secondary | ICD-10-CM | POA: Diagnosis not present

## 2023-01-12 LAB — FLOW CYTOMETRY REQUEST - FLUID (INPATIENT)

## 2023-01-12 LAB — OSMOLALITY: Osmolality: 305 mosm/kg — ABNORMAL HIGH (ref 275–295)

## 2023-01-12 MED ORDER — HALOPERIDOL LACTATE 5 MG/ML IJ SOLN
5.0000 mg | Freq: Four times a day (QID) | INTRAMUSCULAR | Status: DC | PRN
  Administered 2023-01-12: 3 mg via INTRAVENOUS
  Administered 2023-01-13: 5 mg via INTRAVENOUS
  Filled 2023-01-12 (×2): qty 1

## 2023-01-12 MED ORDER — LORAZEPAM 2 MG/ML IJ SOLN
1.0000 mg | Freq: Once | INTRAMUSCULAR | Status: AC
  Administered 2023-01-12: 1 mg via INTRAVENOUS
  Filled 2023-01-12: qty 1

## 2023-01-12 MED ORDER — SORBITOL 70 % SOLN
200.0000 mL | TOPICAL_OIL | Freq: Once | ORAL | Status: DC
  Filled 2023-01-12: qty 60

## 2023-01-12 MED ORDER — GABAPENTIN 100 MG PO CAPS
200.0000 mg | ORAL_CAPSULE | Freq: Once | ORAL | Status: AC
  Administered 2023-01-12: 200 mg via ORAL
  Filled 2023-01-12: qty 2

## 2023-01-12 NOTE — Progress Notes (Signed)
Physical Therapy Treatment Patient Details Name: Richard Wallace MRN: [id number] DOB: 03-27-1946 Today's Date: 01/12/2023   History of Present Illness 77 y.o. male Presents to ED 01/05/23 secondary to complaints intermittent episodes of confusion, slurred speech, dysarthria, expressive aphasia x 1 week. 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    PT Comments  Wife present and supportive, active in discussions for patient needs and care upon d/c. POC updated, now plans to take pt home with HHPT since he was denied by SNF. Pt able to transfer to and from several surfaces today. Seems to do best with verbal cues and no assistive device as the novelty and complexity confuses pt. Suggest hospital bed upon d/c. Will continue to follow and progress during acute admission. Patient will continue to benefit from skilled physical therapy services to further improve independence with functional mobility.    If plan is discharge home, recommend the following: A little help with walking and/or transfers;Assistance with cooking/housework;Direct supervision/assist for medications management;Direct supervision/assist for financial management;Assist for transportation;Help with stairs or ramp for entrance;Supervision due to cognitive status     Equipment Recommendations  Hospital bed;Other (comment) (Stedy but if not covered wife may prefer hoyer lift; however with potential for hospice care can likely defer to a later time based on his current mobility level.)    Recommendations for Other Services       Precautions / Restrictions Precautions Precautions:  Fall Restrictions Weight Bearing Restrictions: No     Mobility  Bed Mobility               General bed mobility comments: received on BSC and left in recliner at end of session    Transfers Overall transfer level: Needs assistance Equipment used: 1 person hand held assist, 2 person hand held assist, Ambulation equipment used, Sliding board Transfers: Sit to/from Stand, Bed to chair/wheelchair/BSC Sit to Stand: Min assist, Contact guard assist   Step pivot transfers: Min assist, Contact guard assist      Lateral/Scoot Transfers: Supervision General transfer comment: Tried various methods with wife present and observing to determine safest level and equipment to transfer with. Min A to CGA to stand in stedy with constant cues for hand placement. Stedy > bedside. Attempted sliding board with pt unable to attend to scoot and attemping to pull out board. Opted for lateral scoot to drop arm BSC with cues for sequencing though no physical assist needed. Trialed step pivot back to bed with CGA and then back to recliner with no more than Min A for guiding hips to surface. Transfer via Lift Equipment: Stedy  Ambulation/Gait                   Stairs             Wheelchair Mobility     Tilt Bed    Modified Rankin (Stroke Patients Only)       Balance Overall balance assessment: Needs assistance Sitting-balance support: Feet supported, No upper extremity supported Sitting balance-Leahy Scale: Fair     Standing balance support: Bilateral upper extremity supported Standing balance-Leahy Scale: Poor  Cognition Arousal: Alert Behavior During Therapy: Restless, Flat affect, Impulsive Overall Cognitive Status: Impaired/Different from baseline Area of Impairment: Attention, Memory, Following commands, Safety/judgement, Problem solving, Awareness                   Current Attention Level: Sustained Memory: Decreased  short-term memory Following Commands: Follows one step commands consistently, Follows one step commands with increased time Safety/Judgement: Decreased awareness of deficits, Decreased awareness of safety Awareness: Intellectual Problem Solving: Slow processing, Requires verbal cues, Difficulty sequencing, Requires tactile cues General Comments: able to follow basic one step commands, better participation with familiar tasks and DME. impulsive at times, attempting to step LLE over Stedy knee blocking pad and pulling sliding board out despite cues to use it to scoot        Exercises      General Comments General comments (skin integrity, edema, etc.): Wife at bedside, engaged in session      Pertinent Vitals/Pain Pain Assessment Pain Assessment: No/denies pain    Home Living                          Prior Function            PT Goals (current goals can now be found in the care plan section) Acute Rehab PT Goals Patient Stated Goal: spouse goal is for more independence with bed mobility and transfers for decreased caregiver burden PT Goal Formulation: With patient/family Time For Goal Achievement: 01/21/23 Potential to Achieve Goals: Good Progress towards PT goals: Progressing toward goals    Frequency    Min 1X/week      PT Plan      Co-evaluation PT/OT/SLP Co-Evaluation/Treatment: Yes Reason for Co-Treatment: Necessary to address cognition/behavior during functional activity;For patient/therapist safety;To address functional/ADL transfers;Complexity of the patient's impairments (multi-system involvement) PT goals addressed during session: Mobility/safety with mobility;Balance;Proper use of DME OT goals addressed during session: ADL's and self-care;Proper use of Adaptive equipment and DME      AM-PAC PT "6 Clicks" Mobility   Outcome Measure  Help needed turning from your back to your side while in a flat bed without using bedrails?: None Help needed  moving from lying on your back to sitting on the side of a flat bed without using bedrails?: A Little Help needed moving to and from a bed to a chair (including a wheelchair)?: Total Help needed standing up from a chair using your arms (e.g., wheelchair or bedside chair)?: Total Help needed to walk in hospital room?: Total Help needed climbing 3-5 steps with a railing? : Total 6 Click Score: 11    End of Session Equipment Utilized During Treatment: Gait belt Activity Tolerance: Patient tolerated treatment well Patient left: in chair;with call bell/phone within reach;with chair alarm set;with family/visitor present (MD in room) Nurse Communication: Mobility status PT Visit Diagnosis: Unsteadiness on feet (R26.81);Muscle weakness (generalized) (M62.81)     Time: 5176-1607 PT Time Calculation (min) (ACUTE ONLY): 23 min  Charges:    $Therapeutic Activity: 8-22 mins PT General Charges $$ ACUTE PT VISIT: 1 Visit                     Kathlyn [place], PT, DPT [facility]  Rehabilitation Services Physical Therapist Office: [phone] Website: [email or web address]    Berton Mount 01/12/2023, 1:24 PM

## 2023-01-12 NOTE — Progress Notes (Signed)
  Progress Note   Patient: Richard Wallace FAO:[id number] DOB: 11-23-1945 DOA: 01/05/2023     7 DOS: the patient was seen and examined on 01/12/2023 at 10:03 AM      Brief hospital course: 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  In the ER, MRI brain showed leptomeningeal enhancement, so he was started on empiric antibiotics and admitted for LP     Assessment and Plan: Acute metabolic encephalopathy, suspected due to leukemic involvement of the CNS -PT/OT  Essential hypertension -Continue amlodipine  Constipation - Repeat suppository - Continue MiraLAX and senna - Enema if no bowel movement      Subjective: Still constipated, but no new nursing concerns, no seizures, no fever, no worsening mental status.    Physical Exam: BP (!) 144/79 (BP Location: Right Arm)   Pulse 94   Temp 98.2 F (36.8 C) (Oral)   Resp 20   Ht 5' 9.5" (1.765 m)   Wt 98.9 kg   SpO2 (!) 89%   BMI 31.73 kg/m   Elderly adult male, appears weak and tired, working with physical therapy RRR, no murmurs, no peripheral edema Respiratory rate normal, lungs clear without rales or wheezes Abdomen soft without tenderness palpation or guarding Attention diminished, psychomotor slowing noted, generalized weakness     Data Reviewed: Discussed with oncology by phone Family Communication: Wife at the bedside   Disposition: Status is: Inpatient The patient has been refused by skilled nursing facility.  He will need substantial equipment in order to function safely at home, which will not be delivered until tomorrow        Author: Alberteen Sam, MD 01/12/2023 5:40 PM  For on call review [email or web address].

## 2023-01-12 NOTE — Plan of Care (Signed)
  Problem: Activity: Goal: Risk for activity intolerance will decrease Outcome: Progressing   Problem: Nutrition: Goal: Adequate nutrition will be maintained Outcome: Progressing   Problem: Coping: Goal: Level of anxiety will decrease Outcome: Progressing   

## 2023-01-12 NOTE — Progress Notes (Signed)
Occupational Therapy Treatment Patient Details Name: Richard Wallace MRN: [id number] DOB: [birth year] Today's Date: 01/12/2023   History of present illness 77 y.o. male Presents to ED 01/05/23 secondary to complaints intermittent episodes of confusion, slurred speech, dysarthria, expressive aphasia x 1 week. 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   OT comments  Pt seen in conjunction with PT and pt's wife to make appropriate DME recs and ADL/transfer education as pt not approved for SNF rehab, opting for home now. Pt continues to be limited by cognitive barriers but when given basic one step directions for familiar tasks, pt able to complete tasks with less assistance. Overall, pt able to manage scoot transfer and stand pivot transfer with no more than Min A today. Attempted Stedy and sliding board but was more difficult for pt as this is not familiar DME. 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 Pt would benefit from hospital bed and potentially oversized/bari BSC to maximize safety with lateral leans/scoot transfers.       If plan is discharge home, recommend the following:  A little help with walking and/or transfers;A lot of help with bathing/dressing/bathroom   Equipment Recommendations  Hospital bed;Other (comment) (bariatric BSC if possible)    Recommendations for Other Services      Precautions / Restrictions Precautions Precautions: Fall Restrictions Weight Bearing Restrictions: No       Mobility Bed Mobility                General bed mobility comments: received on BSC and left in recliner at end of session    Transfers Overall transfer level: Needs assistance Equipment used: 1 person hand held assist, 2 person hand held assist, Ambulation equipment used, Sliding board Transfers: Sit to/from Stand, Bed to chair/wheelchair/BSC Sit to Stand: Min assist, Contact guard assist     Step pivot transfers: Min assist, Contact guard assist    Lateral/Scoot Transfers: Supervision General transfer comment: Min A to CGA to stand in stedy with constant cues for hand placement. Stedy > bedside. Attempted sliding board with pt unable to attend to scoot and attemping to pull out board. Opted for lateral scoot to drop arm BSC with cues for sequencing though no physical assist needed. Trialed step pivot back to bed with CGA and then back to recliner with no more than Min A for guiding hips to surface     Balance Overall balance assessment: Needs assistance Sitting-balance support: Feet supported, No upper extremity supported Sitting balance-Leahy Scale: Fair     Standing balance support: Bilateral upper extremity supported Standing balance-Leahy Scale: Poor                             ADL either performed or assessed with clinical judgement   ADL Overall ADL's : Needs assistance/impaired                           Toilet Transfer Details (indicate cue type and reason): Received on toilet with Stedy in front of pt. used Stedy to get pt off of  BSC and faciliate other transfer types                Extremity/Trunk Assessment Upper Extremity Assessment Upper Extremity Assessment: Generalized weakness;Right hand dominant;Difficult to assess due to impaired cognition   Lower Extremity Assessment Lower Extremity Assessment: Defer to PT evaluation        Vision   Vision Assessment?: No apparent visual deficits   Perception     Praxis      Cognition Arousal:  Alert Behavior During Therapy: Restless, Flat affect, Impulsive Overall Cognitive Status: Impaired/Different from baseline Area of Impairment: Attention, Memory, Following commands, Safety/judgement, Problem solving, Awareness                   Current Attention Level: Sustained Memory: Decreased short-term memory Following Commands: Follows one step commands consistently, Follows one step commands with increased time Safety/Judgement: Decreased awareness of deficits, Decreased awareness of safety Awareness: Intellectual Problem Solving: Slow processing, Requires verbal cues, Difficulty sequencing, Requires tactile cues General Comments: able to follow basic one step commands, better participation with familiar tasks and DME. impulsive at times, attempting to step LLE over Stedy knee blocking pad and pulling sliding board out despite cues to use it to scoot        Exercises      Shoulder Instructions       General Comments Wife at bedside, engaged in session    Pertinent Vitals/ Pain       Pain Assessment Pain Assessment: No/denies pain  Home Living                                          Prior Functioning/Environment              Frequency  Min 1X/week        Progress Toward Goals  OT Goals(current goals can now be found in the care plan section)  Progress towards OT goals: Progressing toward goals  Acute Rehab OT Goals Patient Stated Goal: wife hopeful to have the needed DME at all OT Goal Formulation: With patient Time For Goal Achievement: 01/21/23 Potential to Achieve Goals: Fair ADL Goals Pt Will Perform Eating: with set-up;with supervision;sitting Pt Will Perform Grooming: with set-up;with supervision;sitting Pt Will Perform Upper Body Dressing: with set-up;with supervision;sitting Pt Will Perform Lower Body Dressing: sit to/from stand;with min assist Pt Will Transfer to Toilet: with min assist;stand pivot transfer  Plan       Co-evaluation    PT/OT/SLP Co-Evaluation/Treatment: Yes Reason for Co-Treatment: Necessary to address cognition/behavior during functional activity;For patient/therapist safety;To address functional/ADL transfers   OT goals addressed during session: ADL's and self-care;Proper use of Adaptive equipment and DME      AM-PAC OT "6 Clicks" Daily Activity     Outcome Measure   Help from another person eating meals?: A Little Help from another person taking care of personal grooming?: A Little Help from another person toileting, which includes using toliet, bedpan, or urinal?: A Lot Help from another person bathing (including washing, rinsing, drying)?: A Lot Help from another person to put on and taking off regular upper body clothing?: A Lot Help from another person to put on and taking off regular lower body clothing?: A Lot 6 Click Score: 14    End of Session Equipment Utilized During Treatment: Gait belt  OT Visit Diagnosis: Unsteadiness on feet (R26.81);Muscle weakness (generalized) (M62.81);Other symptoms and  signs involving cognitive function;Other abnormalities of gait and mobility (R26.89);Cognitive communication deficit (R41.841)   Activity Tolerance Patient tolerated treatment well   Patient Left in chair;with call bell/phone within reach;with chair alarm set;with family/visitor present   Nurse Communication Mobility status        Time: 1610-9604 OT Time Calculation (min): 23 min  Charges: OT General Charges $OT Visit: 1 Visit OT Treatments $Therapeutic Activity: 8-22 mins  Bradd Canary, OTR/L Acute Rehab Services Office: [phone]   Lorre Munroe 01/12/2023, 12:43 PM

## 2023-01-12 NOTE — Progress Notes (Signed)
    Durable Medical Equipment  (From admission, onward)           Start     Ordered   01/12/23 1426  For home use only DME Hospital bed  Once       Question Answer Comment  Length of Need 6 Months   The above medical condition requires: Patient requires the ability to reposition frequently   Head must be elevated greater than: 30 degrees   Bed type Semi-electric      01/12/23 1426   01/12/23 1426  For home use only DME Bedside commode  Once       Comments: Bariatric size due to body habitus  Question:  Patient needs a bedside commode to treat with the following condition  Answer:  Weakness   01/12/23 1426            BSC:  The patient is confined to one level of the home environment and there is no toilet on the that level of the home.   Patient requires a bariatric bedside commode due to his body habitus. He can not safely use a regular size BSC.  Hospital bed:  Pt requires a hospital bed as he needs to reposition frequently and a regular bed will not allow for this. He also needs head elevated at least 30 degrees.

## 2023-01-12 NOTE — TOC Progression Note (Signed)
Transition of Care [facility]) - Progression Note    Patient Details  Name: Richard Wallace MRN: [id number] Date of Birth: 16-May-1945  Transition of Care [facility]) CM/SW Contact  Baldemar Lenis, [place] Phone Number: 01/12/2023, 3:57 PM  Clinical Narrative:   CSW met with spouse to update that [facility] has declined. CSW asked spouse about plan for disposition, if she wanted to look at other SNF options or go home. Spouse would like to go home with home health to hopefully improve patient's function before transition to hospice. CSW provided patient with choice for home health agency, she chose [facility]. CSW sent [facility] orders for home health. CSW coordinated with PT and OT for DME recommendations, orders were placed for hospital bed and 3N1, orders sent to [facility]. [facility] has no availability to deliver today and they do not operate deliveries over the weekend, patient is slated for delivery Monday. CSW updated spouse and she is understanding. CSW to follow.    Expected Discharge Plan: Home w Home Health Services Barriers to Discharge: Continued Medical Work up, Equipment Delay  Expected Discharge Plan and Services   Discharge Planning Services: CM Consult Post Acute Care Choice: Skilled Nursing Facility Living arrangements for the past 2 months: Single Family Home                                       Social Determinants of Health (SDOH) Interventions SDOH Screenings   Food Insecurity: No Food Insecurity (01/05/2023)  Housing: Low Risk  (01/05/2023)  Transportation Needs: Unmet Transportation Needs (01/05/2023)  Utilities: Not At Risk (01/05/2023)  Alcohol Screen: Low Risk  (10/30/2022)  Depression (PHQ2-9): Low Risk  (10/31/2022)  Financial Resource Strain: Low Risk  (10/30/2022)  Physical Activity: Unknown (10/30/2022)  Social Connections: Moderately Isolated (10/30/2022)  Stress: No Stress Concern Present (10/30/2022)  Tobacco Use: Medium Risk (01/05/2023)     Readmission Risk Interventions     No data to display

## 2023-01-12 NOTE — Plan of Care (Signed)
  Problem: Clinical Measurements: Goal: Will remain free from infection Outcome: Not Progressing   Problem: Clinical Measurements: Goal: Diagnostic test results will improve Outcome: Not Progressing   Problem: Activity: Goal: Risk for activity intolerance will decrease Outcome: Not Progressing   Problem: Nutrition: Goal: Adequate nutrition will be maintained Outcome: Not Progressing   Problem: Coping: Goal: Level of anxiety will decrease Outcome: Not Progressing   Problem: Elimination: Goal: Will not experience complications related to bowel motility Outcome: Not Progressing   Problem: Safety: Goal: Ability to remain free from injury will improve Outcome: Not Progressing

## 2023-01-13 DIAGNOSIS — G9341 Metabolic encephalopathy: Secondary | ICD-10-CM | POA: Diagnosis not present

## 2023-01-13 DIAGNOSIS — C9200 Acute myeloblastic leukemia, not having achieved remission: Secondary | ICD-10-CM | POA: Diagnosis not present

## 2023-01-13 DIAGNOSIS — I10 Essential (primary) hypertension: Secondary | ICD-10-CM | POA: Diagnosis not present

## 2023-01-13 LAB — SODIUM, URINE, RANDOM: Sodium, Ur: 46 mmol/L

## 2023-01-13 LAB — OSMOLALITY, URINE: Osmolality, Ur: 695 mosm/kg (ref 300–900)

## 2023-01-13 MED ORDER — SORBITOL 70 % SOLN
300.0000 mL | TOPICAL_OIL | Freq: Once | ORAL | Status: AC
  Administered 2023-01-13: 300 mL via RECTAL
  Filled 2023-01-13: qty 90

## 2023-01-13 MED ORDER — LORAZEPAM 1 MG PO TABS
1.0000 mg | ORAL_TABLET | Freq: Four times a day (QID) | ORAL | Status: DC | PRN
  Administered 2023-01-15: 1 mg via ORAL
  Filled 2023-01-13 (×2): qty 1

## 2023-01-13 MED ORDER — QUETIAPINE FUMARATE 25 MG PO TABS
25.0000 mg | ORAL_TABLET | Freq: Every day | ORAL | Status: DC
  Administered 2023-01-13 – 2023-01-14 (×2): 25 mg via ORAL
  Filled 2023-01-13 (×2): qty 1

## 2023-01-13 NOTE — Progress Notes (Signed)
  Progress Note   Patient: Richard Wallace GEX:[id number] DOB: [birth year] DOA: 01/05/2023     8 DOS: the patient was seen and examined on 01/13/2023       Brief hospital course: 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

## 2023-01-13 NOTE — Plan of Care (Signed)
  Problem: Clinical Measurements: Goal: Will remain free from infection Outcome: Progressing   Problem: Activity: Goal: Risk for activity intolerance will decrease Outcome: Progressing   Problem: Nutrition: Goal: Adequate nutrition will be maintained Outcome: Progressing   Problem: Coping: Goal: Level of anxiety will decrease Outcome: Progressing   Problem: Pain Managment: Goal: General experience of comfort will improve Outcome: Progressing   Problem: Safety: Goal: Ability to remain free from injury will improve Outcome: Progressing   Problem: Skin Integrity: Goal: Risk for impaired skin integrity will decrease Outcome: Progressing   

## 2023-01-13 NOTE — Progress Notes (Signed)
PT Cancellation Note  Patient Details Name: Areeb Korbel MRN: [id number] DOB: [birth year]   Cancelled Treatment:    Reason Eval/Treat Not Completed: (P) Other (comment) (RN defer, pt in process of getting enema, not ready for OOB mobility.) Will continue efforts per PT plan of care as schedule permits.   Anyssa Sharpless M Akiya Morr 01/13/2023, 11:07 AM

## 2023-01-13 NOTE — Plan of Care (Signed)
  Problem: Safety: Goal: Ability to remain free from injury will improve Outcome: Not Progressing   Problem: Elimination: Goal: Will not experience complications related to bowel motility Outcome: Not Progressing   

## 2023-01-13 NOTE — Progress Notes (Signed)
At 1930, wife requested not to have the SMOG enema for tonight as she wants the patient to sleep due to him being agitated since 1800, she agreed to have it at 0600 instead.  At 0323, patient was awaken and started to become agitated again, wife asked for medication to settle patient, informed her that Enema is due at 0600 or I can do it now while patient is awake, she requested to have it in daytime when patient is fully awake and not agitated and she prefers for him to be medicated now to lessen his agitation instead. Informed Dr. Joneen Roach.

## 2023-01-14 DIAGNOSIS — C9200 Acute myeloblastic leukemia, not having achieved remission: Secondary | ICD-10-CM | POA: Diagnosis not present

## 2023-01-14 DIAGNOSIS — I10 Essential (primary) hypertension: Secondary | ICD-10-CM | POA: Diagnosis not present

## 2023-01-14 DIAGNOSIS — G9341 Metabolic encephalopathy: Secondary | ICD-10-CM | POA: Diagnosis not present

## 2023-01-14 NOTE — Plan of Care (Signed)
  Problem: Education: Goal: Knowledge of General Education information will improve Description: Including pain rating scale, medication(s)/side effects and non-pharmacologic comfort measures Outcome: Progressing   Problem: Safety: Goal: Ability to remain free from injury will improve Outcome: Progressing   Problem: Skin Integrity: Goal: Risk for impaired skin integrity will decrease Outcome: Progressing   Problem: Education: Goal: Knowledge of General Education information will improve Description: Including pain rating scale, medication(s)/side effects and non-pharmacologic comfort measures Outcome: Progressing   Problem: Clinical Measurements: Goal: Will remain free from infection Outcome: Progressing   Problem: Safety: Goal: Ability to remain free from injury will improve Outcome: Progressing

## 2023-01-14 NOTE — Plan of Care (Signed)
  Problem: Education: Goal: Knowledge of General Education information will improve Description: Including pain rating scale, medication(s)/side effects and non-pharmacologic comfort measures Outcome: Progressing   Problem: Clinical Measurements: Goal: Cardiovascular complication will be avoided Outcome: Progressing   Problem: Activity: Goal: Risk for activity intolerance will decrease Outcome: Progressing   Problem: Coping: Goal: Level of anxiety will decrease Outcome: Progressing   Problem: Elimination: Goal: Will not experience complications related to urinary retention Outcome: Progressing   Problem: Skin Integrity: Goal: Risk for impaired skin integrity will decrease Outcome: Progressing   Problem: Education: Goal: Knowledge of General Education information will improve Description: Including pain rating scale, medication(s)/side effects and non-pharmacologic comfort measures Outcome: Progressing

## 2023-01-14 NOTE — Progress Notes (Signed)
  Progress Note   Patient: Richard Wallace NFA:[id number] DOB: Dec 09, 1945 DOA: 01/05/2023     9 DOS: the patient was seen and examined on 01/14/2023 at 9:54 AM      Brief hospital course: Richard Wallace is a 78 y.o. 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 Mentation seems stable - Continue nightly Seroquel - Lorazepam as needed for anxiety - PT/OT - Follow-up with oncology     Gout -Continue allopurinol  Hypertension Blood pressure adequately controlled - Continue amlodipine  Constipation -Continue daily bowel regimen  BPH -Continue Flomax          Subjective: Mentation unchanged, no new findings, no fever, no nursing concerns     Physical Exam: BP 137/83 (BP Location: Right Arm)   Pulse 98   Temp 98.3 F (36.8 C) (Oral)   Resp 16   Ht 5' 9.5" (1.765 m)   Wt 98.9 kg   SpO2 95%   BMI 31.73 kg/m   Adult male, sitting up in bed, makes eye contact, responds to questions but is somewhat confused and disoriented RRR, no murmurs, no peripheral edema Respiratory rate normal, lungs clear without rales or wheezes Speech is fluent, upper extremity strength generally weak but symmetric  Data Reviewed: No new labs  Family Communication: Wife at the bedside    Disposition: Status is: Inpatient The patient wants to be discharged home with home health physical therapy, but he has extensive equipment needs which cannot be delivered until Monday, at present is not safe to discharge him home without equipment so we will have to keep until Monday and then discharge        Author: Alberteen Sam, MD 01/14/2023 1:13 PM  For on call review [email or web address].

## 2023-01-15 ENCOUNTER — Other Ambulatory Visit (HOSPITAL_COMMUNITY): Payer: Self-pay

## 2023-01-15 ENCOUNTER — Telehealth: Payer: Self-pay | Admitting: Internal Medicine

## 2023-01-15 ENCOUNTER — Encounter (HOSPITAL_COMMUNITY): Payer: Self-pay | Admitting: Hematology

## 2023-01-15 ENCOUNTER — Encounter: Payer: Self-pay | Admitting: Hematology

## 2023-01-15 ENCOUNTER — Other Ambulatory Visit: Payer: Self-pay

## 2023-01-15 DIAGNOSIS — G9341 Metabolic encephalopathy: Secondary | ICD-10-CM | POA: Diagnosis not present

## 2023-01-15 MED ORDER — MELATONIN 3 MG PO TABS: 3.0000 mg | ORAL_TABLET | Freq: Every day | ORAL | Status: DC

## 2023-01-15 MED ORDER — HEPARIN SOD (PORK) LOCK FLUSH 100 UNIT/ML IV SOLN
500.0000 [IU] | INTRAVENOUS | Status: AC | PRN
  Administered 2023-01-15: 500 [IU]

## 2023-01-15 MED ORDER — QUETIAPINE FUMARATE 25 MG PO TABS
ORAL_TABLET | ORAL | 0 refills | Status: DC
  Filled 2023-01-15: qty 60, 20d supply, fill #0

## 2023-01-15 MED ORDER — OXYCODONE HCL 5 MG PO TABS
5.0000 mg | ORAL_TABLET | Freq: Four times a day (QID) | ORAL | 0 refills | Status: DC | PRN
  Filled 2023-01-15: qty 20, 5d supply, fill #0

## 2023-01-15 MED ORDER — POLYETHYLENE GLYCOL 3350 17 GM/SCOOP PO POWD
17.0000 g | Freq: Every day | ORAL | 0 refills | Status: DC
  Filled 2023-01-15: qty 238, 14d supply, fill #0

## 2023-01-15 MED ORDER — LORAZEPAM 1 MG PO TABS
1.0000 mg | ORAL_TABLET | Freq: Four times a day (QID) | ORAL | 0 refills | Status: DC | PRN
  Filled 2023-01-15: qty 30, 8d supply, fill #0

## 2023-01-15 NOTE — Care Management Important Message (Signed)
Important Message  Patient Details  Name: Richard Wallace MRN: [id number] Date of Birth: October 23, 1945   Medicare Important Message Given:  Yes     [name] Stefan Church 01/15/2023, 2:43 PM

## 2023-01-15 NOTE — Progress Notes (Signed)
14Fr Urethral Catheter inserted per order, patient tolerated well. Education provided to wife at bedside

## 2023-01-15 NOTE — Progress Notes (Signed)
PT Cancellation Note  Patient Details Name: Richard Wallace MRN: [id number] DOB: 1945-07-30   Cancelled Treatment:    Reason Eval/Treat Not Completed: Other (comment) Nursing staff transferring pt back to bed when PT entered room. Wife present. I spoke wit her regarding PT offerings and equipment recommendations. She states DME to be delivered this evening. Has no questions or requests for PT at this time. Will continue to follow during admission.   Kathlyn [place], PT, DPT [facility]  Rehabilitation Services Physical Therapist Office: [phone] Website: [email or web address]  Berton Mount 01/15/2023, 1:40 PM

## 2023-01-15 NOTE — Discharge Summary (Signed)
Physician Discharge Summary   Patient: Richard Wallace MRN: [id number] DOB: [birth year]  Admit date:     01/05/2023  Discharge date: 01/15/23  Discharge Physician: Alberteen Sam   PCP: Billie Lade, MD     Recommendations at discharge:  Follow-up with Dr. Durwin Nora in 1 week for leukemic invasion of CNS If patient clinical status worsening, please refer to Hospice     Discharge Diagnoses: Principal Problem:   Acute metabolic encephalopathy Active Problems:   Essential hypertension   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HCC)   Hyponatremia      Hospital Course: 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  In the ER, MRI brain showed leptomeningeal enhancement, so he was started on empiric antibiotics and admitted for LP      * Acute metabolic encephalopathy Patient admitted and had LP.  LP showed atypical cells, and further flow cytometric analysis and review by hematopathologist showed a mixed population of cells including blasts, neutrophils and granulocytic precursors.  Given the extremely low likelihood of marrow contamination on this fluoroscopically guided LP, this was believed to represent progression of his AML to the CNS.    Case discussed with Dr. Ellin Saba.  In discussion with family and patient, they wished to defer further testing and discharge home.  Hospice was recommended, but family elected to make an effort at rehabilitation first.    Started on Seroquel for intermittent delirium/agitation.  Provided with Ativan and oxycodone for home palliative use.             The [facility] Controlled Substances Registry was reviewed for this patient prior to discharge.   Consultants: Neurology Procedures performed:  MRI brain LP   Disposition: Home health Diet recommendation:  Regular diet  DISCHARGE MEDICATION: Allergies as of 01/15/2023   No Known Allergies       Medication List     TAKE these medications    Acetaminophen Extra Strength 500 MG Caps Take 1 tablet by mouth in the morning and at bedtime.   allopurinol 300 MG tablet Commonly known as: ZYLOPRIM TAKE 1 TABLET BY MOUTH AT BEDTIME   amLODipine 5 MG tablet Commonly known as: NORVASC Take 1 tablet (5 mg total) by mouth daily.   azelastine 0.1 % nasal spray Commonly known as: ASTELIN Place 1 spray into both nostrils 2 (two) times daily. Use in each nostril as directed   diclofenac Sodium 1 % Gel Commonly known as: VOLTAREN Apply three times daily to knees as needed for pain   lidocaine-prilocaine cream Commonly known as: EMLA APPLY CREAM TOPICALLY ONE HOUR PRIOR TO CHEMOTHERAPY APPOINTMENT   LORazepam 1 MG tablet Commonly known as: ATIVAN Take 1 tablet (1 mg total) by mouth every 6 (six) hours as needed for anxiety.   magnesium oxide 400 (240 Mg) MG tablet Commonly known as: MAG-OX TAKE 1 TABLET BY MOUTH THREE TIMES DAILY What changed: when to take this   melatonin 3 MG Tabs tablet Take 1 tablet (3 mg total) by mouth at bedtime.   oxyCODONE 5 MG immediate release tablet Commonly known as: Oxy IR/ROXICODONE Take 1 tablet (5 mg total) by mouth every 6 (six) hours as needed.   polyethylene glycol powder 17 GM/SCOOP powder Commonly known as: GLYCOLAX/MIRALAX Take 1 capful (17 g) with water by mouth daily. Start taking on: January 16, 2023   pregabalin 25 MG capsule Commonly known as: Lyrica Take 1 capsule (25 mg total)  by mouth 2 (two) times daily.   QUEtiapine 25 MG tablet Commonly known as: SEROQUEL Take 25 mg (1 tab) nightly for agitation.  If needed, take an additional 25 mg tablet up to every 8 hours as needed for agitation.  Take no more than 3 tabs per 24 hours.   SF 5000 Plus 1.1 % Crea dental cream Generic drug: sodium fluoride SMARTSIG:Sparingly By Mouth Every Night   tamsulosin 0.4 MG Caps capsule Commonly known as: FLOMAX Take 2 capsules (0.8 mg  total) by mouth daily.               Durable Medical Equipment  (From admission, onward)           Start     Ordered   01/12/23 1426  For home use only DME Hospital bed  Once       Question Answer Comment  Length of Need 6 Months   The above medical condition requires: Patient requires the ability to reposition frequently   Head must be elevated greater than: 30 degrees   Bed type Semi-electric      01/12/23 1426   01/12/23 1426  For home use only DME Bedside commode  Once       Comments: Bariatric size due to body habitus  Question:  Patient needs a bedside commode to treat with the following condition  Answer:  Weakness   01/12/23 1426             Discharge Instructions     Discharge instructions   Complete by: As directed    **IMPORTANT DISCHARGE INSTRUCTIONS**   From Dr. Maryfrances Bunnell: You were admitted for brain invasion by Leukemia  Work with physical therapy to get stronger.  Given there is no treatment for Richard Wallace's leukemia in the brain, it may be that hospice is needed soon  For agitation, take quetiapine/Seroquel 25 mg nightly You may take an additional 1-2 doses per day as needed for agitation This can be increased further, but I would not recommend that without discussion with a hospice doctor  For anxiety, you may take lorazepam 1 mg by mouth   For pain, take oxycodone  Use a stool softener and MiraLAX daily   Increase activity slowly   Complete by: As directed    Verify informed consent   Complete by: As directed        Discharge Exam: Filed Weights   01/05/23 0943  Weight: 98.9 kg    General: Pt is  awake, not in acute distress, seems dazed Cardiovascular: RRR, nl S1-S2, no murmurs appreciated.   No LE edema.   Respiratory: Normal respiratory rate and rhythm.  CTAB without rales or wheezes. Abdominal: Abdomen soft and non-tender.  No distension or HSM.   Neuro/Psych: Strength symmetric in upper and lower extremities.  Judgment and  insight appear impaired, appears quite generally weak.   Condition at discharge: poor  The results of significant diagnostics from this hospitalization (including imaging, microbiology, ancillary and laboratory) are listed below for reference.   Imaging Studies: DG FL GUIDED LUMBAR PUNCTURE  Result Date: 01/07/2023 CLINICAL DATA:  Altered mental status. 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 Request for diagnostic lumbar puncture. EXAM: DIAGNOSTIC LUMBAR PUNCTURE UNDER FLUOROSCOPIC GUIDANCE FLUOROSCOPY TIME:  Radiation Exposure Index (as provided by the fluoroscopic device): 2.90 mGy Number of Acquired Images:  1 PROCEDURE: Informed consent was obtained from the patient prior to the procedure, including potential complications of headache, allergy, and pain. With the patient prone, the lower back  was prepped with Betadine. 1% Lidocaine was used for local anesthesia. Lumbar puncture was performed at the L2-L3 level using a 20 gauge needle with return of clear, colorless CSF with an opening pressure of 21 cm water. Approximately 14 ml of CSF were obtained for laboratory studies. Closing pressure of 11 cm water. The patient tolerated the procedure well and there were no apparent complications. IMPRESSION: Technically successful lumbar puncture from L2-L3 level without complication. Procedure performed by Brayton El PA-C supervised by Dr. Kennith Center Electronically Signed   By: Kennith Center M.D.   On: 01/07/2023 11:45   EEG adult  Result Date: 01/07/2023 Jefferson Fuel, MD     01/07/2023  9:16 AM Routine EEG Report Sok Dolton is a 77 y.o. male with a history of altered mental status who is undergoing an EEG to evaluate for seizures. Report: This EEG was acquired with electrodes placed according to the International 10-20 electrode system (including Fp1, Fp2, F3, F4, C3, C4, P3, P4, O1, O2, T3, T4, T5, T6, A1, A2, Fz, Cz, Pz). The following electrodes were missing or displaced: none. The occipital dominant  rhythm was 7 Hz. This activity is reactive to stimulation. Drowsiness was manifested by background fragmentation; deeper stages of sleep were identified by K complexes and sleep spindles. There was no focal slowing. There were no interictal epileptiform discharges. There were no electrographic seizures identified. Photic stimulation and hyperventilation were not performed. Impression and clinical correlation: This EEG was obtained while awake and asleep and is abnormal due to mild diffuse slowing indicative of global cerebral dysfunction. Epileptiform abnormalities were not seen during this recording. Patient had numerous episodes of full body jerks while falling asleep that were associated with EMG artifact only and were consistent with (normal) sleep/physiologic myoclonus. Bing Neighbors, MD [facility] [phone] If 7pm- 7am, please page neurology on call as listed in AMION.   MR Brain W and Wo Contrast  Result Date: 01/05/2023 CLINICAL DATA:  Mental status change, unknown cause EXAM: MRI HEAD WITHOUT AND WITH CONTRAST TECHNIQUE: Multiplanar, multiecho pulse sequences of the brain and surrounding structures were obtained without and with intravenous contrast. CONTRAST:  10mL GADAVIST GADOBUTROL 1 MMOL/ML IV SOLN COMPARISON:  Same day CT head.  MRI April 11, 24. FINDINGS: Brain: Multifocal abnormal sulcal enhancement including along the left greater than right frontoparietal convexity and bilateral posterior fossa. Areas of intermixed possible punctate acute cortical infarcts (as evident on diffusion-weighted imaging) and potential trace subarachnoid hemorrhage versus chronic superficial siderosis on susceptibility weighted imaging and FLAIR. No significant mass effect or hydrocephalus. No midline shift. No visible extra-axial fluid collection. Vascular: Major arterial flow voids are maintained. Skull and upper cervical spine: Normal marrow signal. Sinuses/Orbits: Clear sinuses.  No acute or  findings. Other: No mastoid effusions. IMPRESSION: Multifocal sulcal enhancement including along the left greater than right frontoparietal convexity and bilateral posterior fossa. Areas of possible punctate acute cortical infarcts and potential trace subarachnoid hemorrhage versus chronic superficial siderosis. 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 Recommend neurology consultation and lumbar puncture. Electronically Signed   By: Feliberto Harts M.D.   On: 01/05/2023 13:04   DG Chest Portable 1 View  Result Date: 01/05/2023 CLINICAL DATA:  Malaise. History of leukemia. Altered mental status EXAM: PORTABLE CHEST 1 VIEW COMPARISON:  X-ray 05/22/2022 FINDINGS: The inferior costophrenic angles are clipped off the edge of the film. Stable left subclavian port tip along the upper SVC normal cardiopericardial silhouette. Tortuous and  ectatic aorta. No consolidation, pneumothorax or effusion. No edema. Chronic lung changes are again seen. IMPRESSION: Chest port.  No acute cardiopulmonary disease Electronically Signed   By: Karen Kays M.D.   On: 01/05/2023 10:34   CT HEAD WO CONTRAST  Result Date: 01/05/2023 CLINICAL DATA:  Neuro deficit, acute, stroke suspected. Confusion and slurred speech. Right arm weakness. Currently receiving chemotherapy for leukemia. EXAM: CT HEAD WITHOUT CONTRAST TECHNIQUE: Contiguous axial images were obtained from the base of the skull through the vertex without intravenous contrast. RADIATION DOSE REDUCTION: This exam was performed according to the departmental dose-optimization program which includes automated exposure control, adjustment of the mA and/or kV according to patient size and/or use of iterative reconstruction technique. COMPARISON:  Head MRI 08/17/2022 FINDINGS: Brain: There scattered small areas of subtle hyperattenuation along cerebral gyri bilaterally which appear more cortical than sulcal  arguing against subarachnoid hemorrhage. No intracranial mass effect or brain edema is evident. No acute large territory infarct or extra-axial fluid collection is identified. There is mild cerebral atrophy. Cerebral white matter hypodensities are nonspecific but compatible with mild chronic small vessel ischemic disease. Vascular: No hyperdense vessel. Skull: No acute fracture or suspicious osseous lesion. Sinuses/Orbits: Small mucous retention cyst in the right maxillary sinus. Clear mastoid air cells. Bilateral cataract extraction. Other: None. IMPRESSION: 1. Scattered areas of subtle hyperdensity along cerebral cortex bilaterally, not clearly reflecting hemorrhage and of indeterminate etiology. While artifact could be contributing to this appearance, given the clinical symptoms and history of malignancy, a brain MRI without and with contrast is recommended for further evaluation. Electronically Signed   By: Sebastian Ache M.D.   On: 01/05/2023 10:30    Microbiology: Results for orders placed or performed during the hospital encounter of 01/05/23  Fungus Culture With Stain     Status: None (Preliminary result)   Collection Time: 01/06/23  6:30 AM  Result Value Ref Range Status   Fungus Stain Final report  Final    Comment: (NOTE) Performed At: [facility] [address] [place], [place] [id number] Jolene Schimke MD WU:[id number]    Fungus (Mycology) Culture PENDING  Incomplete   Fungal Source CSF  Final    Comment: Performed at [facility], [address] [place], [place] [address]  CSF culture w Gram Stain     Status: None   Collection Time: 01/06/23  6:30 AM   Specimen: PATH Cytology CSF; Cerebrospinal Fluid  Result Value Ref Range Status   Specimen Description CSF  Final   Special Requests NONE  Final   Gram Stain   Final    WBC PRESENT, PREDOMINANTLY PMN NO ORGANISMS SEEN CYTOSPIN SMEAR    Culture   Final    NO GROWTH 3 DAYS Performed at [facility],  [address], [place], [place] [address]    Report Status 01/09/2023 FINAL  Final  Fungus Culture Result     Status: None   Collection Time: 01/06/23  6:30 AM  Result Value Ref Range Status   Result 1 Comment  Final    Comment: (NOTE) KOH/Calcofluor preparation:  no fungus observed. Performed At: [facility] [address] [place], [place] [id number] Jolene Schimke MD MW:[id number]     Labs: CBC: No results for input(s): "WBC", "NEUTROABS", "HGB", "HCT", "MCV", "PLT" in the last 168 hours. Basic Metabolic Panel: No results for input(s): "NA", "K", "CL", "CO2", "GLUCOSE", "BUN", "CREATININE", "CALCIUM", "MG", "PHOS" in the last 168 hours. Liver Function Tests: No results for input(s): "  AST", "ALT", "ALKPHOS", "BILITOT", "PROT", "ALBUMIN" in the last 168 hours. CBG: No results for input(s): "GLUCAP" in the last 168 hours.  Discharge time spent: approximately 35 minutes spent on discharge counseling, evaluation of patient on day of discharge, and coordination of discharge planning with nursing, social work, pharmacy and case management  Signed: Alberteen Sam, MD [facility] 01/15/2023

## 2023-01-15 NOTE — Progress Notes (Signed)
Occupational Therapy Treatment Patient Details Name: Richard Wallace MRN: [id number] DOB: January 21, 1946 Today's Date: 01/15/2023   History of present illness 77 y.o. male Presents to ED 01/05/23 secondary to complaints intermittent episodes of confusion, slurred speech, dysarthria, expressive aphasia x 1 week. 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   OT comments  Patient seen with wife present for family education and to attempt transfers with RW to prepare for discharge home. Patient required mod assist to get to EOB and able to maintain sitting balance. Patient performed 3 stands from EOB with mod assist of 2 to sit to stand and cues for posture and weight shifting with wife assisting. Patient able to perform transfer to recliner with mod assist of 2 using RW. From recliner, using arm rest to push up from, patient was able to stand with mod assist of one. Recommend continued OT treatment with HHOT to further address functional tranfers and self care to decrease caregiver burden. Acute OT to continue to follow.       If plan is discharge home, recommend the following:  A little help with walking and/or transfers;A lot of help with bathing/dressing/bathroom   Equipment Recommendations  Hospital bed;Other (comment) (bariatric BSC if possible)    Recommendations for Other Services      Precautions / Restrictions Precautions Precautions: Fall Restrictions Weight Bearing Restrictions: No       Mobility Bed Mobility Overal bed mobility: Needs Assistance Bed Mobility: Supine to Sit     Supine to sit: Mod assist, Used rails     General bed  mobility comments: cues for rail use and assistance to raise trunk, bed pad used to assist with scooting to EOB bed    Transfers Overall transfer level: Needs assistance Equipment used: Rolling walker (2 wheels) Transfers: Sit to/from Stand, Bed to chair/wheelchair/BSC Sit to Stand: Mod assist, +2 physical assistance Stand pivot transfers: Mod assist, +2 physical assistance         General transfer comment: performed with mod assist of 2 for sit to stand and mod assist of 2 to manage RW and to pivot to recliner     Balance Overall balance assessment: Needs assistance Sitting-balance support: Feet supported, No upper extremity supported Sitting balance-Leahy Scale: Fair   Postural control: Right lateral lean Standing balance support: Bilateral upper extremity supported Standing balance-Leahy Scale: Poor Standing balance comment: cues for posture and to use BUE to assist with balance                           ADL either performed or assessed with clinical judgement   ADL Overall ADL's : Needs assistance/impaired     Grooming: Wash/dry face;Wash/dry hands;Oral care;Moderate assistance;Minimal assistance;Sitting Grooming Details (indicate cue type and reason): mod assist for oral care with dentures                                    Extremity/Trunk Assessment              Vision       Perception     Praxis      Cognition Arousal: Alert Behavior During  Therapy: Restless, Flat affect, Impulsive Overall Cognitive Status: Impaired/Different from baseline Area of Impairment: Attention, Memory, Following commands, Safety/judgement, Problem solving, Awareness                 Orientation Level: Disoriented to, Time, Situation Current Attention Level: Sustained Memory: Decreased short-term memory Following Commands: Follows one step commands consistently, Follows one step commands with increased time Safety/Judgement: Decreased awareness  of deficits, Decreased awareness of safety Awareness: Intellectual Problem Solving: Slow processing, Requires verbal cues, Difficulty sequencing, Requires tactile cues General Comments: verbal and tactile cues to follow commands        Exercises      Shoulder Instructions       General Comments Wife present and assisted with transfers for family education and prepare for discharge home    Pertinent Vitals/ Pain       Pain Assessment Pain Assessment: Faces Faces Pain Scale: No hurt Pain Intervention(s): Monitored during session  Home Living                                          Prior Functioning/Environment              Frequency  Min 1X/week        Progress Toward Goals  OT Goals(current goals can now be found in the care plan section)  Progress towards OT goals: Progressing toward goals  Acute Rehab OT Goals Patient Stated Goal: go home OT Goal Formulation: With patient/family Time For Goal Achievement: 01/21/23 Potential to Achieve Goals: Fair ADL Goals Pt Will Perform Eating: with set-up;with supervision;sitting Pt Will Perform Grooming: with set-up;with supervision;sitting Pt Will Perform Upper Body Dressing: with set-up;with supervision;sitting Pt Will Perform Lower Body Dressing: sit to/from stand;with min assist Pt Will Transfer to Toilet: with min assist;stand pivot transfer  Plan      Co-evaluation                 AM-PAC OT "6 Clicks" Daily Activity     Outcome Measure   Help from another person eating meals?: A Little Help from another person taking care of personal grooming?: A Little Help from another person toileting, which includes using toliet, bedpan, or urinal?: A Lot Help from another person bathing (including washing, rinsing, drying)?: A Lot Help from another person to put on and taking off regular upper body clothing?: A Lot Help from another person to put on and taking off regular lower body  clothing?: A Lot 6 Click Score: 14    End of Session Equipment Utilized During Treatment: Gait belt;Rolling walker (2 wheels)  OT Visit Diagnosis: Unsteadiness on feet (R26.81);Muscle weakness (generalized) (M62.81);Other symptoms and signs involving cognitive function;Other abnormalities of gait and mobility (R26.89);Cognitive communication deficit (R41.841)   Activity Tolerance Patient tolerated treatment well   Patient Left in chair;with call bell/phone within reach;with chair alarm set;with family/visitor present   Nurse Communication Mobility status        Time: 7829-5621 OT Time Calculation (min): 24 min  Charges: OT General Charges $OT Visit: 1 Visit OT Treatments $Self Care/Home Management : 8-22 mins $Therapeutic Activity: 8-22 mins  Alfonse Flavors, OTA Acute Rehabilitation Services  Office [phone]   Dewain Penning 01/15/2023, 11:12 AM

## 2023-01-15 NOTE — TOC Transition Note (Signed)
Transition of Care [facility]) - CM/SW Discharge Note   Patient Details  Name: Richard Wallace MRN: [id number] Date of Birth: 1946-01-31  [facility] CM/SW Contact:  Baldemar Lenis, LCSW Phone Number: 01/15/2023, 1:03 PM   Clinical Narrative:   CSW met with wife to check on DME delivery, she had not gotten a call. CSW contacted [facility] to check on DME delivery, they had not marked the patient down for delivery today. CSW asked for delivery today, sent updated notes that [facility] requested. CSW received call back from [facility] that they can deliver today. CSW updated spouse at bedside, she is Adult nurse. DME to be delivered this evening, unsure about timing; spouse asking for transport around 5 so they can get home, she can manage for a short while before hospital bed is delivered. Transport arranged with [facility] for 5:00 pickup. No further TOC needs.    Final next level of care: Home w Home Health Services Barriers to Discharge: Barriers Resolved   Patient Goals and CMS Choice CMS Medicare.gov Compare Post Acute Care list provided to:: Other (Comment Required) Choice offered to / list presented to : Spouse  Discharge Placement                  Patient to be transferred to facility by: [facility] Name of family member notified: Aurea Graff Patient and family notified of of transfer: 01/15/23  Discharge Plan and Services Additional resources added to the After Visit Summary for     Discharge Planning Services: CM Consult Post Acute Care Choice: Skilled Nursing Facility                               Social Determinants of Health (SDOH) Interventions SDOH Screenings   Food Insecurity: No Food Insecurity (01/05/2023)  Housing: Low Risk  (01/05/2023)  Transportation Needs: Unmet Transportation Needs (01/05/2023)  Utilities: Not At Risk (01/05/2023)  Alcohol Screen: Low Risk  (10/30/2022)  Depression (PHQ2-9): Low Risk  (10/31/2022)  Financial Resource Strain: Low  Risk  (10/30/2022)  Physical Activity: Unknown (10/30/2022)  Social Connections: Moderately Isolated (10/30/2022)  Stress: No Stress Concern Present (10/30/2022)  Tobacco Use: Medium Risk (01/05/2023)     Readmission Risk Interventions     No data to display

## 2023-01-15 NOTE — Telephone Encounter (Signed)
[facility] calling wanting to know if Richard Wallace will be willing to follow patient for home health. Please advise [phone] Thank you

## 2023-01-15 NOTE — Progress Notes (Signed)
Waiting for [facility] for transportaion to home

## 2023-01-15 NOTE — Progress Notes (Signed)
Pt discharged to home in stable condition. All discharge instructions reviewed with pt's wife at bedside. Pt's wife gives complete verbal understanding of all of the above. Awaiting transportation to home.

## 2023-01-15 NOTE — Telephone Encounter (Signed)
Returned call

## 2023-01-16 ENCOUNTER — Telehealth: Payer: Self-pay

## 2023-01-16 NOTE — Transitions of Care (Post Inpatient/ED Visit) (Unsigned)
   01/16/2023  Name: Richard Wallace MRN: [id number] DOB: 12-03-1945  Today's TOC FU Call Status: Today's TOC FU Call Status:: Unsuccessful Call (1st Attempt) Unsuccessful Call (1st Attempt) Date: 01/16/23  Attempted to reach the patient regarding the most recent Inpatient/ED visit.  Follow Up Plan: Additional outreach attempts will be made to reach the patient to complete the Transitions of Care (Post Inpatient/ED visit) call.   Signature Karena Addison, LPN [facility] Nurse Health Advisor Direct Dial [phone]

## 2023-01-17 NOTE — Transitions of Care (Post Inpatient/ED Visit) (Signed)
01/17/2023  Name: Richard Wallace MRN: [id number] DOB: 11-04-1945  Today's TOC FU Call Status: Today's TOC FU Call Status:: Successful TOC FU Call Completed Unsuccessful Call (1st Attempt) Date: 01/16/23 [facility] FU Call Complete Date: 01/17/23 Patient's Name and Date of Birth confirmed.  Transition Care Management Follow-up Telephone Call Date of Discharge: 01/15/23 Discharge Facility: [facility]) Type of Discharge: Inpatient Admission Primary Inpatient Discharge Diagnosis:: dysarthria How have you been since you were released from the hospital?: Worse Any questions or concerns?: No  Items Reviewed: Did you receive and understand the discharge instructions provided?: No Medications obtained,verified, and reconciled?: Yes (Medications Reviewed) Any new allergies since your discharge?: No Dietary orders reviewed?: Yes Do you have support at home?: Yes People in Home: spouse  Medications Reviewed Today: Medications Reviewed Today     Reviewed by Karena Addison, LPN (Licensed Practical Nurse) on 01/17/23 at 1614  Med List Status: <None>   Medication Order Taking? Sig Documenting Provider Last Dose Status Informant  ACETAMINOPHEN EXTRA STRENGTH 500 MG capsule [id number] No Take 1 tablet by mouth in the morning and at bedtime. [provider] 01/05/2023 Active Spouse/Significant Other  allopurinol (ZYLOPRIM) 300 MG tablet [id number] No TAKE 1 TABLET BY MOUTH AT BEDTIME Doreatha Massed, MD 01/04/2023 Active Spouse/Significant Other  amLODipine (NORVASC) 5 MG tablet [id number] No Take 1 tablet (5 mg total) by mouth daily. Billie Lade, MD 01/05/2023 Active Spouse/Significant Other  azelastine (ASTELIN) 0.1 % nasal spray [id number] No Place 1 spray into both nostrils 2 (two) times daily. Use in each nostril as directed Billie Lade, MD 01/05/2023 Active Spouse/Significant Other  diclofenac Sodium (VOLTAREN) 1 % GEL [id number] No Apply three times daily to knees as needed for  pain Doreatha Massed, MD 01/05/2023 Active Spouse/Significant Other  lidocaine-prilocaine (EMLA) cream [id number] No APPLY CREAM TOPICALLY ONE HOUR PRIOR TO CHEMOTHERAPY APPOINTMENT Doreatha Massed, MD Past Month Active Spouse/Significant Other  LORazepam (ATIVAN) 1 MG tablet [id number]  Take 1 tablet (1 mg total) by mouth every 6 (six) hours as needed for anxiety. Alberteen Sam, MD  Active   magnesium oxide (MAG-OX) 400 (240 Mg) MG tablet [id number] No TAKE 1 TABLET BY MOUTH THREE TIMES DAILY  Patient taking differently: Take 400 mg by mouth 2 (two) times daily.   Doreatha Massed, MD 01/05/2023 Active Spouse/Significant Other  melatonin 3 MG TABS tablet [id number]  Take 1 tablet (3 mg total) by mouth at bedtime. Alberteen Sam, MD  Active   oxyCODONE (OXY IR/ROXICODONE) 5 MG immediate release tablet [id number]  Take 1 tablet (5 mg total) by mouth every 6 (six) hours as needed. Danford, Earl Lites, MD  Active   polyethylene glycol powder (GLYCOLAX/MIRALAX) 17 GM/SCOOP powder [id number]  Take 1 capful (17 g) with water by mouth daily. Alberteen Sam, MD  Active   pregabalin (LYRICA) 25 MG capsule [id number] No Take 1 capsule (25 mg total) by mouth 2 (two) times daily. Doreatha Massed, MD 01/05/2023 Active Spouse/Significant Other  QUEtiapine (SEROQUEL) 25 MG tablet [id number]  Take 25 mg (1 tab) nightly for agitation.  If needed, take an additional 25 mg tablet up to every 8 hours as needed for agitation.  Take no more than 3 tabs per 24 hours. Alberteen Sam, MD  Active   SF 5000 PLUS 1.1 % CREA dental cream [id number] No SMARTSIG:Sparingly By Mouth Every Night [provider] 01/04/2023 Active Spouse/Significant Other  tamsulosin (FLOMAX) 0.4 MG CAPS capsule [id number] No Take 2 capsules (0.8 mg total) by  mouth daily. Billie Lade, MD 01/04/2023 Active Spouse/Significant Other  Med List Note Remi Haggard, RPH-CPP 03/11/19 1129): Venetoclax  filled at [facility]            Home Care and Equipment/Supplies: Were Home Health Services Ordered?: Yes Name of Home Health Agency:: HOSPICE Has Agency set up a time to come to your home?: Yes First Home Health Visit Date: 01/17/23 Any new equipment or medical supplies ordered?: NA  Functional Questionnaire: Do you need assistance with bathing/showering or dressing?: Yes Do you need assistance with meal preparation?: Yes Do you need assistance with eating?: No Do you have difficulty maintaining continence: No Do you need assistance with getting out of bed/getting out of a chair/moving?: Yes Do you have difficulty managing or taking your medications?: Yes  Follow up appointments reviewed: PCP Follow-up appointment confirmed?: No (Hospice) MD Provider Line [phone] Given: No Specialist Hospital Follow-up appointment confirmed?: NA Do you need transportation to your follow-up appointment?: No Do you understand care options if your condition(s) worsen?: Yes-patient verbalized understanding    SIGNATURE Karena Addison, LPN [facility] Nurse Health Advisor Direct Dial [phone]

## 2023-01-31 ENCOUNTER — Ambulatory Visit: Payer: Medicare Other | Admitting: Internal Medicine

## 2023-02-01 ENCOUNTER — Encounter: Payer: Self-pay | Admitting: Internal Medicine

## 2023-02-05 LAB — FUNGUS CULTURE RESULT

## 2023-02-05 LAB — FUNGUS CULTURE WITH STAIN

## 2023-02-05 LAB — FUNGAL ORGANISM REFLEX

## 2023-02-06 DEATH — deceased

## 2023-02-08 ENCOUNTER — Encounter (HOSPITAL_COMMUNITY): Payer: Self-pay | Admitting: Hematology

## 2023-02-08 ENCOUNTER — Encounter: Payer: Self-pay | Admitting: Hematology

## 2023-02-09 ENCOUNTER — Other Ambulatory Visit: Payer: Self-pay

## 2023-02-09 DIAGNOSIS — D46Z Other myelodysplastic syndromes: Secondary | ICD-10-CM

## 2023-02-10 ENCOUNTER — Other Ambulatory Visit: Payer: Self-pay

## 2023-02-12 ENCOUNTER — Inpatient Hospital Stay: Payer: Medicare Other | Admitting: Hematology

## 2023-02-12 ENCOUNTER — Inpatient Hospital Stay: Payer: Medicare Other

## 2023-02-12 ENCOUNTER — Inpatient Hospital Stay: Payer: Medicare Other | Attending: Hematology

## 2023-02-13 ENCOUNTER — Inpatient Hospital Stay: Payer: Medicare Other

## 2023-02-14 ENCOUNTER — Inpatient Hospital Stay: Payer: Medicare Other

## 2023-02-15 ENCOUNTER — Inpatient Hospital Stay: Payer: Medicare Other

## 2023-02-16 ENCOUNTER — Inpatient Hospital Stay: Payer: Medicare Other

## 2023-02-20 ENCOUNTER — Telehealth: Payer: Self-pay | Admitting: Internal Medicine

## 2023-02-20 NOTE — Telephone Encounter (Signed)
Spouse called patient passed away February 09, 2023.

## 2023-03-19 ENCOUNTER — Encounter: Payer: Self-pay | Admitting: Hematology

## 2023-03-19 ENCOUNTER — Encounter (HOSPITAL_COMMUNITY): Payer: Self-pay | Admitting: Hematology

## 2023-03-26 ENCOUNTER — Inpatient Hospital Stay: Payer: Medicare Other | Admitting: Hematology

## 2023-03-26 ENCOUNTER — Inpatient Hospital Stay: Payer: Medicare Other

## 2023-03-27 ENCOUNTER — Inpatient Hospital Stay: Payer: Medicare Other

## 2023-03-28 ENCOUNTER — Inpatient Hospital Stay: Payer: Medicare Other

## 2023-03-29 ENCOUNTER — Inpatient Hospital Stay: Payer: Medicare Other

## 2023-03-30 ENCOUNTER — Inpatient Hospital Stay: Payer: Medicare Other
